# Patient Record
Sex: Female | Born: 1940
Health system: Southern US, Community
[De-identification: ages and names within clinical notes are randomized; demographics above are authoritative.]

## PROBLEM LIST (undated history)

## (undated) DIAGNOSIS — M47812 Spondylosis without myelopathy or radiculopathy, cervical region: Secondary | ICD-10-CM

## (undated) DIAGNOSIS — I499 Cardiac arrhythmia, unspecified: Secondary | ICD-10-CM

## (undated) DIAGNOSIS — R7989 Other specified abnormal findings of blood chemistry: Secondary | ICD-10-CM

## (undated) DIAGNOSIS — E039 Hypothyroidism, unspecified: Secondary | ICD-10-CM

## (undated) DIAGNOSIS — R110 Nausea: Secondary | ICD-10-CM

## (undated) DIAGNOSIS — Z9289 Personal history of other medical treatment: Secondary | ICD-10-CM

## (undated) DIAGNOSIS — B370 Candidal stomatitis: Secondary | ICD-10-CM

## (undated) DIAGNOSIS — I10 Essential (primary) hypertension: Secondary | ICD-10-CM

## (undated) DIAGNOSIS — D649 Anemia, unspecified: Secondary | ICD-10-CM

## (undated) DIAGNOSIS — J189 Pneumonia, unspecified organism: Secondary | ICD-10-CM

## (undated) DIAGNOSIS — N183 Chronic kidney disease, stage 3 (moderate): Secondary | ICD-10-CM

## (undated) DIAGNOSIS — B3781 Candidal esophagitis: Secondary | ICD-10-CM

## (undated) DIAGNOSIS — I4891 Unspecified atrial fibrillation: Secondary | ICD-10-CM

## (undated) DIAGNOSIS — M1990 Unspecified osteoarthritis, unspecified site: Secondary | ICD-10-CM

## (undated) DIAGNOSIS — Z972 Presence of dental prosthetic device (complete) (partial): Secondary | ICD-10-CM

## (undated) DIAGNOSIS — C8230 Follicular lymphoma grade IIIa, unspecified site: Secondary | ICD-10-CM

## (undated) DIAGNOSIS — R0600 Dyspnea, unspecified: Secondary | ICD-10-CM

## (undated) DIAGNOSIS — R519 Headache, unspecified: Secondary | ICD-10-CM

## (undated) DIAGNOSIS — G5603 Carpal tunnel syndrome, bilateral upper limbs: Secondary | ICD-10-CM

## (undated) HISTORY — PX: COLONOSCOPY: SHX174

## (undated) HISTORY — PX: TUBAL LIGATION: SHX77

## (undated) HISTORY — PX: ABDOMINAL HYSTERECTOMY: SHX81

## (undated) HISTORY — DX: Spondylosis without myelopathy or radiculopathy, cervical region: M47.812

## (undated) HISTORY — DX: Nausea: R11.0

## (undated) HISTORY — PX: DILATION AND CURETTAGE OF UTERUS: SHX78

## (undated) HISTORY — DX: Personal history of other medical treatment: Z92.89

## (undated) HISTORY — DX: Hypothyroidism, unspecified: E03.9

## (undated) HISTORY — PX: OTHER SURGICAL HISTORY: SHX169

## (undated) HISTORY — DX: Follicular lymphoma grade IIIa, unspecified site: C82.30

## (undated) HISTORY — PX: APPENDECTOMY: SHX54

## (undated) HISTORY — PX: BACK SURGERY: SHX140

## (undated) HISTORY — DX: Unspecified osteoarthritis, unspecified site: M19.90

---

## 1998-05-12 ENCOUNTER — Other Ambulatory Visit: Admission: RE | Admit: 1998-05-12 | Discharge: 1998-05-12 | Payer: Self-pay | Admitting: *Deleted

## 1999-05-21 ENCOUNTER — Other Ambulatory Visit: Admission: RE | Admit: 1999-05-21 | Discharge: 1999-05-21 | Payer: Self-pay | Admitting: *Deleted

## 2000-06-09 ENCOUNTER — Other Ambulatory Visit: Admission: RE | Admit: 2000-06-09 | Discharge: 2000-06-09 | Payer: Self-pay | Admitting: *Deleted

## 2001-06-13 ENCOUNTER — Other Ambulatory Visit: Admission: RE | Admit: 2001-06-13 | Discharge: 2001-06-13 | Payer: Self-pay | Admitting: *Deleted

## 2002-06-07 ENCOUNTER — Other Ambulatory Visit: Admission: RE | Admit: 2002-06-07 | Discharge: 2002-06-07 | Payer: Self-pay | Admitting: *Deleted

## 2004-03-30 ENCOUNTER — Encounter: Admission: RE | Admit: 2004-03-30 | Discharge: 2004-03-30 | Payer: Self-pay | Admitting: Neurology

## 2004-04-07 ENCOUNTER — Encounter: Admission: RE | Admit: 2004-04-07 | Discharge: 2004-04-07 | Payer: Self-pay | Admitting: Neurology

## 2004-04-15 ENCOUNTER — Encounter: Admission: RE | Admit: 2004-04-15 | Discharge: 2004-04-15 | Payer: Self-pay | Admitting: Neurology

## 2007-12-12 ENCOUNTER — Encounter: Admission: RE | Admit: 2007-12-12 | Discharge: 2007-12-12 | Payer: Self-pay | Admitting: Chiropractic Medicine

## 2009-07-30 ENCOUNTER — Encounter: Payer: Self-pay | Admitting: Cardiovascular Disease

## 2009-08-21 ENCOUNTER — Encounter: Payer: Self-pay | Admitting: Cardiology

## 2009-08-21 ENCOUNTER — Emergency Department (HOSPITAL_COMMUNITY): Admission: EM | Admit: 2009-08-21 | Discharge: 2009-08-21 | Payer: Self-pay | Admitting: Emergency Medicine

## 2009-09-09 ENCOUNTER — Ambulatory Visit: Payer: Self-pay | Admitting: Cardiology

## 2009-09-09 DIAGNOSIS — E039 Hypothyroidism, unspecified: Secondary | ICD-10-CM | POA: Insufficient documentation

## 2009-09-09 DIAGNOSIS — R072 Precordial pain: Secondary | ICD-10-CM | POA: Insufficient documentation

## 2009-09-15 ENCOUNTER — Telehealth (INDEPENDENT_AMBULATORY_CARE_PROVIDER_SITE_OTHER): Payer: Self-pay | Admitting: *Deleted

## 2009-09-16 ENCOUNTER — Ambulatory Visit: Payer: Self-pay | Admitting: Cardiovascular Disease

## 2009-09-16 ENCOUNTER — Ambulatory Visit: Payer: Self-pay

## 2009-09-16 ENCOUNTER — Encounter (HOSPITAL_COMMUNITY): Admission: RE | Admit: 2009-09-16 | Discharge: 2009-10-15 | Payer: Self-pay | Admitting: Cardiology

## 2009-09-19 ENCOUNTER — Telehealth: Payer: Self-pay | Admitting: Cardiology

## 2009-10-18 HISTORY — PX: TOTAL KNEE ARTHROPLASTY: SHX125

## 2009-12-16 ENCOUNTER — Telehealth (INDEPENDENT_AMBULATORY_CARE_PROVIDER_SITE_OTHER): Payer: Self-pay | Admitting: *Deleted

## 2009-12-22 ENCOUNTER — Inpatient Hospital Stay (HOSPITAL_COMMUNITY): Admission: RE | Admit: 2009-12-22 | Discharge: 2009-12-24 | Payer: Self-pay | Admitting: Orthopedic Surgery

## 2010-11-19 NOTE — Progress Notes (Signed)
  Faxed all Cardiac over to [facility] w/ [facility] to fax [phone] [facility]  December 16, 2009 1:32 PM

## 2011-01-11 LAB — CBC
HCT: 30.5 % — ABNORMAL LOW (ref 36.0–46.0)
HCT: 32.9 % — ABNORMAL LOW (ref 36.0–46.0)
HCT: 45.8 % (ref 36.0–46.0)
Hemoglobin: 10.2 g/dL — ABNORMAL LOW (ref 12.0–15.0)
Hemoglobin: 10.9 g/dL — ABNORMAL LOW (ref 12.0–15.0)
Hemoglobin: 14.8 g/dL (ref 12.0–15.0)
MCHC: 32.3 g/dL (ref 30.0–36.0)
MCHC: 33 g/dL (ref 30.0–36.0)
MCHC: 33.5 g/dL (ref 30.0–36.0)
MCV: 92.6 fL (ref 78.0–100.0)
MCV: 94.4 fL (ref 78.0–100.0)
MCV: 94.5 fL (ref 78.0–100.0)
Platelets: 182 10*3/uL (ref 150–400)
Platelets: 189 10*3/uL (ref 150–400)
Platelets: 254 10*3/uL (ref 150–400)
RBC: 3.23 MIL/uL — ABNORMAL LOW (ref 3.87–5.11)
RBC: 3.48 MIL/uL — ABNORMAL LOW (ref 3.87–5.11)
RBC: 4.95 MIL/uL (ref 3.87–5.11)
RDW: 12.8 % (ref 11.5–15.5)
RDW: 13.2 % (ref 11.5–15.5)
RDW: 13.2 % (ref 11.5–15.5)
WBC: 5 10*3/uL (ref 4.0–10.5)
WBC: 7.9 10*3/uL (ref 4.0–10.5)
WBC: 8.1 10*3/uL (ref 4.0–10.5)

## 2011-01-11 LAB — URINALYSIS, ROUTINE W REFLEX MICROSCOPIC
Bilirubin Urine: NEGATIVE
Glucose, UA: NEGATIVE mg/dL
Hgb urine dipstick: NEGATIVE
Ketones, ur: NEGATIVE mg/dL
Nitrite: NEGATIVE
Protein, ur: NEGATIVE mg/dL
Specific Gravity, Urine: 1.024 (ref 1.005–1.030)
Urobilinogen, UA: 0.2 mg/dL (ref 0.0–1.0)
pH: 5.5 (ref 5.0–8.0)

## 2011-01-11 LAB — PROTIME-INR
INR: 1 (ref 0.00–1.49)
Prothrombin Time: 13.1 seconds (ref 11.6–15.2)

## 2011-01-11 LAB — DIFFERENTIAL
Basophils Absolute: 0 10*3/uL (ref 0.0–0.1)
Basophils Relative: 0 % (ref 0–1)
Eosinophils Absolute: 0.1 10*3/uL (ref 0.0–0.7)
Eosinophils Relative: 1 % (ref 0–5)
Lymphocytes Relative: 17 % (ref 12–46)
Lymphs Abs: 0.8 10*3/uL (ref 0.7–4.0)
Monocytes Absolute: 0.5 10*3/uL (ref 0.1–1.0)
Monocytes Relative: 10 % (ref 3–12)
Neutro Abs: 3.6 10*3/uL (ref 1.7–7.7)
Neutrophils Relative %: 72 % (ref 43–77)

## 2011-01-11 LAB — BASIC METABOLIC PANEL
BUN: 11 mg/dL (ref 6–23)
BUN: 17 mg/dL (ref 6–23)
BUN: 7 mg/dL (ref 6–23)
CO2: 28 mEq/L (ref 19–32)
CO2: 31 mEq/L (ref 19–32)
CO2: 32 mEq/L (ref 19–32)
Calcium: 8.5 mg/dL (ref 8.4–10.5)
Calcium: 8.8 mg/dL (ref 8.4–10.5)
Calcium: 9.4 mg/dL (ref 8.4–10.5)
Chloride: 101 mEq/L (ref 96–112)
Chloride: 103 mEq/L (ref 96–112)
Chloride: 105 mEq/L (ref 96–112)
Creatinine, Ser: 0.78 mg/dL (ref 0.4–1.2)
Creatinine, Ser: 0.79 mg/dL (ref 0.4–1.2)
Creatinine, Ser: 0.87 mg/dL (ref 0.4–1.2)
GFR calc Af Amer: >60 mL/min (ref >60)
GFR calc Af Amer: >60 mL/min (ref >60)
GFR calc Af Amer: >60 mL/min (ref >60)
GFR calc non Af Amer: >60 mL/min (ref >60)
GFR calc non Af Amer: >60 mL/min (ref >60)
GFR calc non Af Amer: >60 mL/min (ref >60)
Glucose, Bld: 125 mg/dL — ABNORMAL HIGH (ref 70–99)
Glucose, Bld: 135 mg/dL — ABNORMAL HIGH (ref 70–99)
Glucose, Bld: 70 mg/dL (ref 70–99)
Potassium: 4.2 mEq/L (ref 3.5–5.1)
Potassium: 4.2 mEq/L (ref 3.5–5.1)
Potassium: 4.3 mEq/L (ref 3.5–5.1)
Sodium: 137 mEq/L (ref 135–145)
Sodium: 138 mEq/L (ref 135–145)
Sodium: 139 mEq/L (ref 135–145)

## 2011-01-11 LAB — APTT: aPTT: 28 seconds (ref 24–37)

## 2011-01-11 LAB — ABO/RH: ABO/RH(D): O POS

## 2011-01-11 LAB — TYPE AND SCREEN
ABO/RH(D): O POS
Antibody Screen: NEGATIVE

## 2011-01-20 LAB — DIFFERENTIAL
Basophils Absolute: 0 10*3/uL (ref 0.0–0.1)
Basophils Relative: 0 % (ref 0–1)
Eosinophils Absolute: 0.1 10*3/uL (ref 0.0–0.7)
Eosinophils Relative: 1 % (ref 0–5)
Lymphocytes Relative: 13 % (ref 12–46)
Lymphs Abs: 1 10*3/uL (ref 0.7–4.0)
Monocytes Absolute: 0.5 10*3/uL (ref 0.1–1.0)
Monocytes Relative: 6 % (ref 3–12)
Neutro Abs: 6.3 10*3/uL (ref 1.7–7.7)
Neutrophils Relative %: 79 % — ABNORMAL HIGH (ref 43–77)

## 2011-01-20 LAB — POCT CARDIAC MARKERS
CKMB, poc: 2.3 ng/mL (ref 1.0–8.0)
CKMB, poc: 2.9 ng/mL (ref 1.0–8.0)
Myoglobin, poc: 79.5 ng/mL (ref 12–200)
Myoglobin, poc: 93.1 ng/mL (ref 12–200)
Troponin i, poc: <0.05 ng/mL (ref 0.00–0.09)
Troponin i, poc: <0.05 ng/mL (ref 0.00–0.09)

## 2011-01-20 LAB — URINE CULTURE: Colony Count: 6000

## 2011-01-20 LAB — COMPREHENSIVE METABOLIC PANEL
ALT: 19 U/L (ref 0–35)
AST: 36 U/L (ref 0–37)
Albumin: 4.2 g/dL (ref 3.5–5.2)
Alkaline Phosphatase: 62 U/L (ref 39–117)
BUN: 21 mg/dL (ref 6–23)
CO2: 27 mEq/L (ref 19–32)
Calcium: 9.7 mg/dL (ref 8.4–10.5)
Chloride: 104 mEq/L (ref 96–112)
Creatinine, Ser: 0.93 mg/dL (ref 0.4–1.2)
GFR calc Af Amer: >60 mL/min (ref >60)
GFR calc non Af Amer: >60 mL/min (ref >60)
Glucose, Bld: 89 mg/dL (ref 70–99)
Potassium: 4.8 mEq/L (ref 3.5–5.1)
Sodium: 139 mEq/L (ref 135–145)
Total Bilirubin: 1 mg/dL (ref 0.3–1.2)
Total Protein: 7 g/dL (ref 6.0–8.3)

## 2011-01-20 LAB — CBC
HCT: 44.1 % (ref 36.0–46.0)
Hemoglobin: 15 g/dL (ref 12.0–15.0)
MCHC: 34 g/dL (ref 30.0–36.0)
MCV: 93.5 fL (ref 78.0–100.0)
Platelets: 232 10*3/uL (ref 150–400)
RBC: 4.71 MIL/uL (ref 3.87–5.11)
RDW: 13.4 % (ref 11.5–15.5)
WBC: 8 10*3/uL (ref 4.0–10.5)

## 2011-01-20 LAB — URINALYSIS, ROUTINE W REFLEX MICROSCOPIC
Bilirubin Urine: NEGATIVE
Glucose, UA: NEGATIVE mg/dL
Hgb urine dipstick: NEGATIVE
Ketones, ur: 15 mg/dL — AB
Nitrite: NEGATIVE
Protein, ur: NEGATIVE mg/dL
Specific Gravity, Urine: 1.022 (ref 1.005–1.030)
Urobilinogen, UA: 0.2 mg/dL (ref 0.0–1.0)
pH: 5.5 (ref 5.0–8.0)

## 2011-01-20 LAB — PROTIME-INR
INR: 1 (ref 0.00–1.49)
Prothrombin Time: 13.1 seconds (ref 11.6–15.2)

## 2011-01-20 LAB — D-DIMER, QUANTITATIVE (NOT AT ARMC): D-Dimer, Quant: 1.31 ug/mL-FEU — ABNORMAL HIGH (ref 0.00–0.48)

## 2011-01-20 LAB — APTT: aPTT: 22 seconds — ABNORMAL LOW (ref 24–37)

## 2012-06-21 ENCOUNTER — Encounter: Payer: Self-pay | Admitting: *Deleted

## 2012-07-10 ENCOUNTER — Encounter: Payer: Self-pay | Admitting: Cardiovascular Disease

## 2012-07-10 ENCOUNTER — Ambulatory Visit (INDEPENDENT_AMBULATORY_CARE_PROVIDER_SITE_OTHER): Payer: Medicare HMO | Admitting: Cardiovascular Disease

## 2012-07-10 VITALS — BP 158/99 | HR 69 | Ht 61.5 in | Wt 126.0 lb

## 2012-07-10 DIAGNOSIS — IMO0001 Reserved for inherently not codable concepts without codable children: Secondary | ICD-10-CM

## 2012-07-10 DIAGNOSIS — I10 Essential (primary) hypertension: Secondary | ICD-10-CM

## 2012-07-10 NOTE — Progress Notes (Signed)
HPI:  71 year old woman presenting for evaluation of hypertension. She was seen in our office in 2010 by Dr. Antoine Poche for evaluation of chest pain. At that time she underwent a stress Myoview evaluation and demonstrated normal myocardial perfusion and normal left ventricular ejection fraction.  She was seen at the [facility] for an outpatient visit recently and was noted to have hypertension. She was advised to seek followup. The patient records home blood pressure periodically and notes readings in the range of 110-120/60-70. She has no cardiopulmonary symptoms. She specifically denies chest pain, chest pressure, dyspnea, palpitations, lightheadedness, orthopnea, PND, or syncope. She notes mild leg edema when she travels.  The patient's mother had hypertension. The patient has no personal history of hypertension and has not required antihypertensive medicine in the past.  Outpatient Encounter Prescriptions as of 07/10/2012  Medication Sig Dispense Refill  . alendronate (FOSAMAX) 70 MG tablet Take 70 mg by mouth every 7 (seven) days. Take with a full glass of water on an empty stomach.      Marland Kitchen aspirin 81 MG tablet Take 81 mg by mouth daily.      . calcium carbonate (OS-CAL) 600 MG TABS Take 600 mg by mouth 2 (two) times daily with a meal.      . estrogens, conjugated, (PREMARIN) 0.3 MG tablet Take 0.3 mg by mouth daily. Take daily for 21 days then do not take for 7 days.      Marland Kitchen levothyroxine (SYNTHROID, LEVOTHROID) 75 MCG tablet Take 75 mcg by mouth daily.      . Multiple Vitamin (MULTIVITAMIN) capsule Take 1 capsule by mouth daily.        Review of patient's allergies indicates no known allergies.  Past Medical History  Diagnosis Date  . DJD (degenerative joint disease)   . Hypothyroid     Past Surgical History  Procedure Date  . Patelar tendon transplants   . Back surgery     X5  . Abdominal hysterectomy   . Dilation and curettage of uterus     History   Social History    . Marital Status: Married    Spouse Name: N/A    Number of Children: 2  . Years of Education: N/A   Occupational History  . retired    Social History Main Topics  . Smoking status: Never Smoker   . Smokeless tobacco: Never Used  . Alcohol Use: Not on file  . Drug Use: Not on file  . Sexually Active: Not on file   Other Topics Concern  . Not on file   Social History Narrative  . No narrative on file   Family history: Negative for coronary artery disease. Her mother died at age 42 of non-Hodgkin's lymphoma. Her father died at age 67 of a drowning accident.  ROS: General: no fevers/chills/night sweats Eyes: no blurry vision, diplopia, or amaurosis ENT: no sore throat or hearing loss Resp: no cough, wheezing, or hemoptysis CV: no edema or palpitations GI: no abdominal pain, nausea, vomiting, diarrhea, or constipation GU: no dysuria, frequency, or hematuria Skin: no rash Neuro: no headache, numbness, tingling, or weakness of extremities Musculoskeletal: no joint pain or swelling Heme: no bleeding, DVT, or easy bruising Endo: no polydipsia or polyuria  BP 158/99  Pulse 69  Ht 5' 1.5" (1.562 m)  Wt 57.153 kg (126 lb)  BMI 23.42 kg/m2 Blood pressure on my recheck was 160/90  PHYSICAL EXAM: Pt is alert and oriented, WD, WN, in no distress. HEENT:  normal Neck: JVP normal. Carotid upstrokes normal without bruits. No thyromegaly. Lungs: equal expansion, clear bilaterally CV: Apex is discrete and nondisplaced, RRR without murmur or gallop Abd: soft, NT, +BS, no bruit, no hepatosplenomegaly Back: no CVA tenderness Ext: no C/C/E        Femoral pulses 2+= without bruits        DP/PT pulses intact and = Skin: warm and dry without rash Neuro: CNII-XII intact             Strength intact = bilaterally  EKG:  Normal sinus rhythm 69 beats per minute, rare PAC, T-wave abnormality consider lateral ischemia. No significant change from previous tracing dated  08/21/2009.  ASSESSMENT AND PLAN: Hypertension, apparent whitecoat hypertension. Based on her home blood pressure readings, she appears to have isolated white-coat HTN. She has not been checking her blood pressure regularly and I asked her start checking her blood pressure 3 times per week for the next 3 weeks. She will call in with the readings especially if they're greater than 140/90. If she ultimately required antihypertensive treatment, she would be a good candidate for diuretic. We'll request her blood work from Dr. Carolee Rota office to make sure that her renal function is normal. At this point I do not think she requires treatment and we will make this decision based on her home readings.  Tonny Bollman 07/10/2012 9:49 AM

## 2012-07-10 NOTE — Patient Instructions (Signed)
Your physician has requested that you regularly monitor and record your blood pressure readings at home. Please use the same machine at the same time of day to check your readings and record them to bring to your follow-up visit. Please check your BP two times per week for the next 3 weeks.  Please contact our office if your BP is running above 140/90.  Your physician recommends that you schedule a follow-up appointment as needed.   Your physician recommends that you continue on your current medications as directed. Please refer to the Current Medication list given to you today.

## 2012-07-26 ENCOUNTER — Telehealth: Payer: Self-pay | Admitting: Cardiovascular Disease

## 2012-07-26 NOTE — Telephone Encounter (Signed)
New Problem:    Patient called in to report that her BP is ok.  Please call back if you have any questions.

## 2012-07-26 NOTE — Telephone Encounter (Signed)
I spoke with the pt and her BP has been normal at home.  The pt's BP has been running around 120/80 on average (124/80, 126/84). The pt will continue current regimen.

## 2013-03-21 DIAGNOSIS — H3560 Retinal hemorrhage, unspecified eye: Secondary | ICD-10-CM | POA: Diagnosis not present

## 2013-03-21 DIAGNOSIS — H25099 Other age-related incipient cataract, unspecified eye: Secondary | ICD-10-CM | POA: Diagnosis not present

## 2013-03-26 DIAGNOSIS — H348392 Tributary (branch) retinal vein occlusion, unspecified eye, stable: Secondary | ICD-10-CM | POA: Diagnosis not present

## 2013-10-18 ENCOUNTER — Emergency Department (HOSPITAL_COMMUNITY)
Admission: EM | Admit: 2013-10-18 | Discharge: 2013-10-18 | Disposition: A | Discharge status: Home / Self Care | Payer: Medicare HMO | Attending: Emergency Medicine | Admitting: Emergency Medicine

## 2013-10-18 ENCOUNTER — Emergency Department (HOSPITAL_COMMUNITY): Payer: Medicare HMO

## 2013-10-18 ENCOUNTER — Encounter (HOSPITAL_COMMUNITY): Payer: Self-pay | Admitting: Emergency Medicine

## 2013-10-18 DIAGNOSIS — R5381 Other malaise: Secondary | ICD-10-CM | POA: Diagnosis not present

## 2013-10-18 DIAGNOSIS — I959 Hypotension, unspecified: Secondary | ICD-10-CM | POA: Diagnosis not present

## 2013-10-18 DIAGNOSIS — Z9889 Other specified postprocedural states: Secondary | ICD-10-CM | POA: Insufficient documentation

## 2013-10-18 DIAGNOSIS — R404 Transient alteration of awareness: Secondary | ICD-10-CM | POA: Diagnosis not present

## 2013-10-18 DIAGNOSIS — R29898 Other symptoms and signs involving the musculoskeletal system: Secondary | ICD-10-CM

## 2013-10-18 DIAGNOSIS — Z7982 Long term (current) use of aspirin: Secondary | ICD-10-CM | POA: Insufficient documentation

## 2013-10-18 DIAGNOSIS — Z79899 Other long term (current) drug therapy: Secondary | ICD-10-CM | POA: Insufficient documentation

## 2013-10-18 DIAGNOSIS — R51 Headache: Secondary | ICD-10-CM | POA: Insufficient documentation

## 2013-10-18 DIAGNOSIS — E039 Hypothyroidism, unspecified: Secondary | ICD-10-CM | POA: Insufficient documentation

## 2013-10-18 DIAGNOSIS — M5412 Radiculopathy, cervical region: Secondary | ICD-10-CM

## 2013-10-18 DIAGNOSIS — R5383 Other fatigue: Principal | ICD-10-CM

## 2013-10-18 LAB — POCT I-STAT, CHEM 8
BUN: 21 mg/dL (ref 6–23)
Calcium, Ion: 1.14 mmol/L (ref 1.13–1.30)
Chloride: 104 mEq/L (ref 96–112)
Creatinine, Ser: 1 mg/dL (ref 0.50–1.10)
Glucose, Bld: 125 mg/dL — ABNORMAL HIGH (ref 70–99)
HCT: 40 % (ref 36.0–46.0)
Hemoglobin: 13.6 g/dL (ref 12.0–15.0)
Potassium: 3.7 mEq/L (ref 3.7–5.3)
Sodium: 143 mEq/L (ref 137–147)
TCO2: 24 mmol/L (ref 0–100)

## 2013-10-18 LAB — CBC WITH DIFFERENTIAL/PLATELET
Basophils Absolute: 0 10*3/uL (ref 0.0–0.1)
Basophils Relative: 0 % (ref 0–1)
Eosinophils Absolute: 0.2 10*3/uL (ref 0.0–0.7)
Eosinophils Relative: 5 % (ref 0–5)
HCT: 38.8 % (ref 36.0–46.0)
Hemoglobin: 13 g/dL (ref 12.0–15.0)
Lymphocytes Relative: 25 % (ref 12–46)
Lymphs Abs: 1.2 10*3/uL (ref 0.7–4.0)
MCH: 30.8 pg (ref 26.0–34.0)
MCHC: 33.5 g/dL (ref 30.0–36.0)
MCV: 91.9 fL (ref 78.0–100.0)
Monocytes Absolute: 0.6 10*3/uL (ref 0.1–1.0)
Monocytes Relative: 12 % (ref 3–12)
Neutro Abs: 2.9 10*3/uL (ref 1.7–7.7)
Neutrophils Relative %: 58 % (ref 43–77)
Platelets: 176 10*3/uL (ref 150–400)
RBC: 4.22 MIL/uL (ref 3.87–5.11)
RDW: 13.2 % (ref 11.5–15.5)
WBC: 4.9 10*3/uL (ref 4.0–10.5)

## 2013-10-18 MED ORDER — NAPROXEN 500 MG PO TABS: 500.0000 mg | ORAL_TABLET | Freq: Two times a day (BID) | ORAL | Status: DC

## 2013-10-18 MED ORDER — PREDNISONE 20 MG PO TABS: 60.0000 mg | ORAL_TABLET | Freq: Every day | ORAL | Status: DC

## 2013-10-18 NOTE — ED Notes (Signed)
EMS-pt reports 2 day history right hand weakness and feeling like she is off balanced, both symptoms are new for the patient. On EMS exam right arm drift noted with no other neurological symptoms. Vitals WNL. Patient got pale and diaphoretic en route when she stated that she felt like she was having a hot flash. 20(L)wrist. CBG 140. 130/80

## 2013-10-18 NOTE — ED Notes (Signed)
Pt reports discomfort to left occipital region radiating down into neck. Denies neck stiffness. States it doesn't feel like a headache.

## 2013-10-18 NOTE — ED Notes (Signed)
Weakness noted to R hand and arm. Pt reports 7/10 headache at the time. Vital signs stable. Pt updated on plan of care for MRI. Pt is alert and oriented x4.

## 2013-10-18 NOTE — ED Notes (Signed)
Pt discharged home. Encouraged to follow up with specialist. Friend at bedside to transport home. Vital signs stable.

## 2013-10-18 NOTE — Discharge Instructions (Signed)
Cervical Radiculopathy  Cervical radiculopathy happens when a nerve in the neck is pinched or bruised by a slipped (herniated) disk or by arthritic changes in the bones of the cervical spine. This can occur due to an injury or as part of the normal aging process. Pressure on the cervical nerves can cause pain or numbness that runs from your neck all the way down into your arm and fingers.  CAUSES   There are many possible causes, including:  · Injury.  · Muscle tightness in the neck from overuse.  · Swollen, painful joints (arthritis).  · Breakdown or degeneration in the bones and joints of the spine (spondylosis) due to aging.  · Bone spurs that may develop near the cervical nerves.  SYMPTOMS   Symptoms include pain, weakness, or numbness in the affected arm and hand. Pain can be severe or irritating. Symptoms may be worse when extending or turning the neck.  DIAGNOSIS   Your caregiver will ask about your symptoms and do a physical exam. He or she may test your strength and reflexes. X-rays, CT scans, and MRI scans may be needed in cases of injury or if the symptoms do not go away after a period of time. Electromyography (EMG) or nerve conduction testing may be done to study how your nerves and muscles are working.  TREATMENT   Your caregiver may recommend certain exercises to help relieve your symptoms. Cervical radiculopathy can, and often does, get better with time and treatment. If your problems continue, treatment options may include:  · Wearing a soft collar for short periods of time.  · Physical therapy to strengthen the neck muscles.  · Medicines, such as nonsteroidal anti-inflammatory drugs (NSAIDs), oral corticosteroids, or spinal injections.  · Surgery. Different types of surgery may be done depending on the cause of your problems.  HOME CARE INSTRUCTIONS   · Put ice on the affected area.  · Put ice in a plastic bag.  · Place a towel between your skin and the bag.  · Leave the ice on for 15-20 minutes,  03-04 times a day or as directed by your caregiver.  · If ice does not help, you can try using heat. Take a warm shower or bath, or use a hot water bottle as directed by your caregiver.  · You may try a gentle neck and shoulder massage.  · Use a flat pillow when you sleep.  · Only take over-the-counter or prescription medicines for pain, discomfort, or fever as directed by your caregiver.  · If physical therapy was prescribed, follow your caregiver's directions.  · If a soft collar was prescribed, use it as directed.  SEEK IMMEDIATE MEDICAL CARE IF:   · Your pain gets much worse and cannot be controlled with medicines.  · You have weakness or numbness in your hand, arm, face, or leg.  · You have a high fever or a stiff, rigid neck.  · You lose bowel or bladder control (incontinence).  · You have trouble with walking, balance, or speaking.  MAKE SURE YOU:   · Understand these instructions.  · Will watch your condition.  · Will get help right away if you are not doing well or get worse.  Document Released: 06/29/2001 Document Revised: 12/27/2011 Document Reviewed: 05/18/2011  [employer]® Patient Information ©2014 [employer].

## 2013-10-18 NOTE — ED Provider Notes (Signed)
CSN: [id number]     Arrival date & time 10/18/13  0415 History   First MD Initiated Contact with Patient 10/18/13 0430     Chief Complaint  Patient presents with  . Extremity Weakness   (Consider location/radiation/quality/duration/timing/severity/associated sxs/prior Treatment) HPI Comments: The patient is a 73 year old female with a history of hypothyroidism and known bone spurs in her cervical spine that have caused a prior mild radiculopathy involving her right great thumb. She has had bilateral back pain in the past and did have some neck pain this evening. She reports that her chief complaint this evening is difficulty using her right hand. Specifically she noted that yesterday morning she awoke with numbness in her right hand over the fourth and fifth digits. This has been persistent over the last 24 hours, this evening when she awoke she noted that she had neck pain in the right and left side of the neck and had difficulty using her hand and her arm. She states that she is unable to straighten or flex her fourth and fifth digits of the right hand. She has had no trouble with her vision, speech or her gait with no leg complaints. There is a mild headache in the posterior scalp area and she denies history of similar symptoms. There has been no fevers chills nausea or vomiting.  Patient is a 73 y.o. female presenting with extremity weakness. The history is provided by the patient.  Extremity Weakness    Past Medical History  Diagnosis Date  . DJD (degenerative joint disease)   . Hypothyroid    Past Surgical History  Procedure Laterality Date  . Patelar tendon transplants    . Back surgery      X5  . Abdominal hysterectomy    . Dilation and curettage of uterus     No family history on file. History  Substance Use Topics  . Smoking status: Never Smoker   . Smokeless tobacco: Never Used  . Alcohol Use: Not on file   OB History   Grav Para Term Preterm Abortions TAB SAB Ect Mult  Living                 Review of Systems  Musculoskeletal: Positive for extremity weakness.  All other systems reviewed and are negative.    Allergies  Review of patient's allergies indicates no known allergies.  Home Medications   Current Outpatient Rx  Name  Route  Sig  Dispense  Refill  . alendronate (FOSAMAX) 70 MG tablet   Oral   Take 70 mg by mouth every 7 (seven) days. Take with a full glass of water on an empty stomach.         Marland Kitchen aspirin 81 MG tablet   Oral   Take 81 mg by mouth daily.         . calcium carbonate (OS-CAL) 600 MG TABS   Oral   Take 600 mg by mouth 2 (two) times daily with a meal.         . estrogens, conjugated, (PREMARIN) 0.3 MG tablet   Oral   Take 0.3 mg by mouth daily. Take daily for 21 days then do not take for 7 days.         Marland Kitchen levothyroxine (SYNTHROID, LEVOTHROID) 75 MCG tablet   Oral   Take 75 mcg by mouth daily.         . Multiple Vitamin (MULTIVITAMIN) capsule   Oral   Take 1 capsule by mouth daily.  BP 167/109  Pulse 67  Temp(Src) 98 F (36.7 C) (Oral)  Resp 19  SpO2 97% Physical Exam  Nursing note and vitals reviewed. Constitutional: She appears well-developed and well-nourished. No distress.  HENT:  Head: Normocephalic and atraumatic.  Mouth/Throat: Oropharynx is clear and moist. No oropharyngeal exudate.  Eyes: Conjunctivae and EOM are normal. Pupils are equal, round, and reactive to light. Right eye exhibits no discharge. Left eye exhibits no discharge. No scleral icterus.  Neck: Normal range of motion. Neck supple. No JVD present. No thyromegaly present.  Cardiovascular: Normal rate, regular rhythm, normal heart sounds and intact distal pulses.  Exam reveals no gallop and no friction rub.   No murmur heard. No carotid bruits auscultated  Pulmonary/Chest: Effort normal and breath sounds normal. No respiratory distress. She has no wheezes. She has no rales.  Abdominal: Soft. Bowel sounds are normal.  She exhibits no distension and no mass. There is no tenderness.  Musculoskeletal: Normal range of motion. She exhibits no edema and no tenderness.  No reproducible tenderness with range of motion of the neck or palpation of the neck  Lymphadenopathy:    She has no cervical adenopathy.  Neurological: She is alert. Coordination normal.  Cranial nerves III through XII are intact, bilateral lower extremities with normal strength and sensation at the major muscle groups. Left upper extremity with a normal exam including strength and sensation in all major muscle groups, right upper extremity with weakness at the triceps, weakness at the shoulder, slight weakness of the grip, inability to extend or flex at the fourth and fifth digits, decreased sensation to the small finger.  Skin: Skin is warm and dry. No rash noted. No erythema.  Psychiatric: She has a normal mood and affect. Her behavior is normal.    ED Course  Procedures (including critical care time) Labs Review Labs Reviewed - No data to display Imaging Review No results found.  EKG Interpretation   None       MDM  No diagnosis found. The patient has focal neurologic abnormalities that could be consistent with a cervical nerve 8 radiculopathy. Because of her mild headache and neck pain we'll also obtain a CT scan of the head to rule out acute stroke though there are no other acute findings that would suggest that this is a stroke. She has no drift on my exam, normal coordination and speech.  Discussed care with the neurosurgeon as well as the neurologist Dr. Doy Mince, agreement and doing an MRI of the cervical spine to look for radiculopathy, compression of cervical nerve.  Change of shift - care signed out to Dr. Karle Starch pending MRI  Johnna Acosta, MD 10/18/13 [phone]

## 2013-10-18 NOTE — ED Provider Notes (Signed)
Care assumed at the change of shift pending MRI of C-spine. Images reviewed, report as below. Disucssed these findings with the patient. Recommend close Neurology follow-up for further management.   Mr Cervical Spine Wo Contrast  10/18/2013   CLINICAL DATA:  Neck pain.  Right arm pain.  EXAM: MRI CERVICAL SPINE WITHOUT CONTRAST  TECHNIQUE: Multiplanar, multisequence MR imaging was performed. No intravenous contrast was administered.  COMPARISON:  None.  FINDINGS: The foramen magnum is widely patent.  C1-2 is normal.  C2-3:  Mild facet degeneration on the left.  No stenosis.  C 3 through C7 shows kyphotic curvature.  C3-4: Facet arthropathy left worse than right. Endplate osteophytes and bulging disk material more marked towards the left. Narrowing of the subarachnoid space but no cord compression. Mild foraminal narrowing bilaterally.  C4-5 spondylosis with endplate osteophytes and bulging of the disc. Effacement of the ventral subarachnoid space but no compression of the cord. Foraminal stenosis bilaterally that could cause neural compression on either or both sides.  C5-6: Spondylosis with endplate osteophytes and bulging of the disc. Narrowing of the ventral subarachnoid space but no compression of the cord. Foraminal stenosis bilaterally that could cause neural compression on either or both sides.  C6-7: Spondylosis with endplate osteophytes and protruding disk material slightly more prominent towards the right. No central canal stenosis. Foraminal narrowing right worse than left. Either C7 nerve root could be affected, more likely the right.  C7-T1:  Mild uncovertebral prominence.  No stenosis.  No abnormal cord signal.  IMPRESSION: Kyphotic curvature in the cervical region.  Degenerative spondylosis from C3-4 thru C6-7. Endplate osteophytes and bulging disc material narrow of the spinal canal, efface the ventral subarachnoid space. No actual compression of the cord. There is foraminal stenosis throughout the  region that could cause neural compression on either or both sides. This is most marked at C4-5 bilaterally, C5-6 bilaterally and on the right at C6-.   Electronically Signed   By: Nelson Chimes M.D.   On: 10/18/2013 10:11    Charles B. Karle Starch, MD 10/18/13 1029

## 2013-10-18 NOTE — ED Notes (Signed)
Pt transported to MRI 

## 2013-10-25 ENCOUNTER — Ambulatory Visit (INDEPENDENT_AMBULATORY_CARE_PROVIDER_SITE_OTHER): Payer: Managed Care, Other (non HMO) | Admitting: Neurology

## 2013-10-25 ENCOUNTER — Encounter: Payer: Self-pay | Admitting: Neurology

## 2013-10-25 VITALS — BP 150/85 | HR 105 | Ht 60.5 in | Wt 141.0 lb

## 2013-10-25 DIAGNOSIS — M47812 Spondylosis without myelopathy or radiculopathy, cervical region: Secondary | ICD-10-CM

## 2013-10-25 DIAGNOSIS — M4712 Other spondylosis with myelopathy, cervical region: Secondary | ICD-10-CM | POA: Insufficient documentation

## 2013-10-25 DIAGNOSIS — R29898 Other symptoms and signs involving the musculoskeletal system: Secondary | ICD-10-CM

## 2013-10-25 HISTORY — DX: Spondylosis without myelopathy or radiculopathy, cervical region: M47.812
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
## 2013-11-05 ENCOUNTER — Ambulatory Visit (INDEPENDENT_AMBULATORY_CARE_PROVIDER_SITE_OTHER): Payer: Managed Care, Other (non HMO) | Admitting: Neurology

## 2013-11-05 ENCOUNTER — Encounter (INDEPENDENT_AMBULATORY_CARE_PROVIDER_SITE_OTHER): Payer: Managed Care, Other (non HMO) | Admitting: Radiology

## 2013-11-05 DIAGNOSIS — M4712 Other spondylosis with myelopathy, cervical region: Secondary | ICD-10-CM

## 2013-11-05 DIAGNOSIS — R29898 Other symptoms and signs involving the musculoskeletal system: Secondary | ICD-10-CM

## 2013-11-05 MED ORDER — GABAPENTIN 100 MG PO CAPS: 100.0000 mg | ORAL_CAPSULE | Freq: Three times a day (TID) | ORAL | Status: DC
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
## 2013-11-13 ENCOUNTER — Other Ambulatory Visit (HOSPITAL_COMMUNITY): Payer: Self-pay | Admitting: Obstetrics and Gynecology

## 2013-11-13 DIAGNOSIS — R011 Cardiac murmur, unspecified: Secondary | ICD-10-CM

## 2013-11-20 ENCOUNTER — Ambulatory Visit (HOSPITAL_COMMUNITY)
Admission: RE | Admit: 2013-11-20 | Discharge: 2013-11-20 | Disposition: A | Discharge status: Home / Self Care | Payer: Medicare HMO | Source: Ambulatory Visit | Attending: Obstetrics and Gynecology | Admitting: Obstetrics and Gynecology

## 2013-11-20 DIAGNOSIS — R011 Cardiac murmur, unspecified: Secondary | ICD-10-CM | POA: Insufficient documentation

## 2013-11-20 DIAGNOSIS — I517 Cardiomegaly: Secondary | ICD-10-CM

## 2013-11-20 DIAGNOSIS — I319 Disease of pericardium, unspecified: Secondary | ICD-10-CM | POA: Insufficient documentation

## 2013-11-20 DIAGNOSIS — I079 Rheumatic tricuspid valve disease, unspecified: Secondary | ICD-10-CM | POA: Insufficient documentation

## 2013-11-20 DIAGNOSIS — I080 Rheumatic disorders of both mitral and aortic valves: Secondary | ICD-10-CM | POA: Insufficient documentation

## 2013-11-20 NOTE — Progress Notes (Signed)
  Echocardiogram 2D Echocardiogram has been performed.  Carrie [name] 11/20/2013, 12:05 PM

## 2014-01-08 ENCOUNTER — Encounter: Payer: Self-pay | Admitting: Cardiovascular Disease

## 2014-01-08 ENCOUNTER — Ambulatory Visit (INDEPENDENT_AMBULATORY_CARE_PROVIDER_SITE_OTHER): Payer: Medicare HMO | Admitting: Cardiovascular Disease

## 2014-01-08 VITALS — BP 126/64 | HR 68 | Ht 60.5 in | Wt 131.0 lb

## 2014-01-08 DIAGNOSIS — IMO0001 Reserved for inherently not codable concepts without codable children: Secondary | ICD-10-CM

## 2014-01-08 DIAGNOSIS — I10 Essential (primary) hypertension: Secondary | ICD-10-CM

## 2014-01-08 NOTE — Patient Instructions (Signed)
Your physician recommends that you schedule a follow-up appointment in: AS NEEDED with Dr. Burt Knack

## 2014-01-10 ENCOUNTER — Encounter: Payer: Self-pay | Admitting: Cardiovascular Disease

## 2014-01-10 NOTE — Progress Notes (Signed)
HPI:  73 year-old woman presenting for follow-up evaluation. She was last seen in 2013 for HTN. Has hx of Myoview scan in 2010 which was normal. Long hx of white-coat HTN noted, but home readings have been good. Recently noted to have a heart murmur and an echocardiogram was done with result below.   She denies chest pain, shortness of breath, edema, or palpitations. Has been having longstanding problems with hot flashes. Was previously on HRT.  The patient is widowed. I followed her husband who had atrial fib in the setting of multiple medical problems.   Outpatient Encounter Prescriptions as of 01/08/2014  Medication Sig  . aspirin 81 MG tablet Take 81 mg by mouth daily.  Marland Kitchen levothyroxine (SYNTHROID, LEVOTHROID) 75 MCG tablet Take 75 mcg by mouth daily.  . Multiple Vitamin (MULTIVITAMIN WITH MINERALS) TABS tablet Take 1 tablet by mouth daily.  . Multiple Vitamins-Minerals (AIRBORNE PO) Take 1 tablet by mouth daily.  Marland Kitchen PARoxetine (PAXIL) 10 MG tablet Take 10 mg by mouth daily.  . Vitamin D, Cholecalciferol, 400 UNITS CHEW Chew 400 each by mouth daily.  . [DISCONTINUED] alendronate (FOSAMAX) 70 MG tablet Take 70 mg by mouth every 7 (seven) days. On tuesdays  . [DISCONTINUED] calcium carbonate (OS-CAL) 600 MG TABS Take 600 mg by mouth 2 (two) times daily with a meal.  . [DISCONTINUED] Calcium-Vitamin D-Vitamin K (CALCIUM + D) [phone] MG-UNT-MCG CHEW Chew 500 each by mouth daily.  . [DISCONTINUED] estrogens, conjugated, (PREMARIN) 0.3 MG tablet Take 0.3 mg by mouth daily. Take daily for 21 days then do not take for 7 days.  . [DISCONTINUED] gabapentin (NEURONTIN) 100 MG capsule Take 1 capsule (100 mg total) by mouth 3 (three) times daily.  . [DISCONTINUED] naproxen (NAPROSYN) 500 MG tablet Take 1 tablet (500 mg total) by mouth 2 (two) times daily.  . [DISCONTINUED] predniSONE (DELTASONE) 20 MG tablet Take 3 tablets (60 mg total) by mouth daily.    No Known Allergies  Past Medical  History  Diagnosis Date  . DJD (degenerative joint disease)   . Hypothyroid   . Cervical spondylosis without myelopathy 10/25/2013    ROS: Negative except as per HPI  BP 126/64  Pulse 68  Ht 5' 0.5" (1.537 m)  Wt 59.421 kg (131 lb)  BMI 25.15 kg/m2  PHYSICAL EXAM: Pt is alert and oriented, NAD HEENT: normal Neck: JVP - normal, carotids 2+= without bruits Lungs: CTA bilaterally CV: RRR with grade 2/6 SEM at the left sternal border Abd: soft, NT, Positive BS, no hepatomegaly Ext: no C/C/E, distal pulses intact and equal Skin: warm/dry no rash  EKG:  NSR 68 bpm, nonspecific T wave abnormality (flattening)  Echo:  Study Conclusions  - Left ventricle: The cavity size was normal. Wall thickness was increased in a pattern of mild LVH. Systolic function was normal. The estimated ejection fraction was in the range of 55% to 60%. Wall motion was normal; there were no regional wall motion abnormalities. Doppler parameters are consistent with abnormal left ventricular relaxation (grade 1 diastolic dysfunction). - Aortic valve: There was no stenosis. Trivial regurgitation. - Mitral valve: Trivial regurgitation. - Left atrium: The atrium was moderately dilated. - Right ventricle: The cavity size was normal. Systolic function was normal. - Tricuspid valve: Peak RV-RA gradient: 84mm Hg (S). - Pulmonary arteries: PA peak pressure: 42mm Hg (S). - Inferior vena cava: The vessel was normal in size; the respirophasic diameter changes were in the normal range (= 50%); findings are consistent with  normal central venous pressure. - Pericardium, extracardiac: A trivial pericardial effusion was identified posterior to the heart. Impressions:  - Normal LV size and systolic function with mild LV hypertrophy. EF 55-60%. Normal RV size and systolic function. No significant valvular abnormalities. Moderate LAE. No definite cause for murmur noted.  ASSESSMENT AND PLAN: 1. Heart murmur -  benign. Exam and echo suggest benign flow murmur.  2. White coat HTN - BP in good range today on no Rx.  Will see back as needed. Overall seems to be stable from CV perspective.   Sherren Mocha 01/10/2014 12:36 AM

## 2014-09-04 ENCOUNTER — Encounter: Payer: Self-pay | Admitting: Neurology

## 2014-09-10 ENCOUNTER — Encounter: Payer: Self-pay | Admitting: Neurology

## 2014-12-20 ENCOUNTER — Ambulatory Visit (INDEPENDENT_AMBULATORY_CARE_PROVIDER_SITE_OTHER): Payer: Self-pay | Admitting: Surgery

## 2014-12-20 NOTE — H&P (Signed)
Carrie [name] 12/20/2014 9:56 AM Location: [facility] Patient #: [id number] DOB: Nov 20, 1940 Widowed / Language: [name] / Race: White Female History of Present Illness Marcello Moores A. Linkin Vizzini MD; 12/20/2014 10:19 AM) Patient words: reck  GROIN LYMPHADENOPATHY.  The patient is a 74 year old female who presents with lymphadenopathy. The last clinic visit was 2 month(s) ago. No changes in management were made at the last visit. Symptoms include palpable lump and visible lump, while symptoms do not include fever or malaise. The lymphadenopathy is located in the left cervical region, in the left axillary region, in the left inguinal region, in the right axillary region and in the right inguinal region. The nodes are described as firm. Onset was gradual. There is no known event that preceded symptom onset. The patient describes this as mild and growing larger. Associated symptoms include night sweats. Other Problems Davy Pique Bynum, CMA; 12/20/2014 9:57 AM) Arthritis Back Pain Heart murmur Hemorrhoids Oophorectomy Bilateral. Thyroid Disease Transfusion history  Past Surgical History Marjean Donna, CMA; 12/20/2014 9:57 AM) Appendectomy Foot Surgery Left. Hysterectomy (not due to cancer) - Complete Knee Surgery Bilateral. Nephrectomy Bilateral. Oral Surgery Spinal Surgery Midback  Diagnostic Studies History Marjean Donna, CMA; 12/20/2014 9:57 AM) Colonoscopy 1-5 years ago Mammogram within last year Pap Smear 1-5 years ago  Allergies Marjean Donna, CMA; 12/20/2014 9:58 AM) No Known Drug Allergies 12/20/2014  Medication History (Sonya Bynum, CMA; 12/20/2014 9:58 AM) Levothyroxine Sodium (100MCG Tablet, Oral) Active. Triamcinolone Acetonide (0.1% Cream, External) Active. Aspirin EC (81MG  Tablet DR, Oral) Active. Medications Reconciled  Social History Marjean Donna, CMA; 12/20/2014 9:57 AM) Alcohol use Occasional alcohol use. Caffeine use Coffee, Tea. No drug use Tobacco  use Never smoker.  Family History Marjean Donna, CMA; 12/20/2014 9:57 AM) Family history unknown First Degree Relatives  Pregnancy / Birth History Marjean Donna, CMA; 12/20/2014 9:57 AM) Age at menarche 29 years. Age of menopause 24-50 Contraceptive History Oral contraceptives. Gravida 3 Maternal age 51-25 Para 2     Review of Systems (Searcy; 12/20/2014 9:57 AM) General Present- Night Sweats. Not Present- Appetite Loss, Chills, Fatigue, Fever, Weight Gain and Weight Loss. HEENT Present- Wears glasses/contact lenses. Not Present- Earache, Hearing Loss, Hoarseness, Nose Bleed, Oral Ulcers, Ringing in the Ears, Seasonal Allergies, Sinus Pain, Sore Throat, Visual Disturbances and Yellow Eyes. Breast Present- Breast Mass. Not Present- Breast Pain, Nipple Discharge and Skin Changes. Female Genitourinary Present- Frequency. Not Present- Nocturia, Painful Urination, Pelvic Pain and Urgency. Musculoskeletal Present- Back Pain and Joint Pain. Not Present- Joint Stiffness, Muscle Pain, Muscle Weakness and Swelling of Extremities. Psychiatric Present- Change in Sleep Pattern. Not Present- Anxiety, Bipolar, Depression, Fearful and Frequent crying. Endocrine Present- Hair Changes and Hot flashes. Not Present- Cold Intolerance, Excessive Hunger, Heat Intolerance and New Diabetes.  Vitals (Sonya Bynum CMA; 12/20/2014 9:57 AM) 12/20/2014 9:57 AM Weight: 133 lb Height: 60in Body Surface Area: 1.6 m Body Mass Index: 25.97 kg/m Temp.: 97.54F(Temporal)  Pulse: 76 (Regular)  BP: 118/80 (Sitting, Left Arm, Standard)     Physical Exam ([place] Hanlon A. Kierston Plasencia MD; 12/20/2014 10:21 AM)  General Mental Status-Alert. General Appearance-Consistent with stated age. Hydration-Well hydrated. Voice-Normal.  Head and Neck Head-normocephalic, atraumatic with no lesions or palpable masses. Trachea-midline. Thyroid Gland Characteristics - normal size and  consistency.  Eye Eyeball - Bilateral-Extraocular movements intact. Sclera/Conjunctiva - Bilateral-No scleral icterus.  Chest and Lung Exam Chest and lung exam reveals -quiet, even and easy respiratory effort with no use of accessory muscles and on auscultation, normal breath sounds,  no adventitious sounds and normal vocal resonance. Inspection Chest Wall - Normal. Back - normal.  Abdomen Inspection Inspection of the abdomen reveals - No Hernias. Skin - Scar - no surgical scars. Palpation/Percussion Palpation and Percussion of the abdomen reveal - Soft, Non Tender, No Rebound tenderness, No Rigidity (guarding) and No hepatosplenomegaly. Auscultation Auscultation of the abdomen reveals - Bowel sounds normal.  Musculoskeletal Normal Exam - Left-Upper Extremity Strength Normal and Lower Extremity Strength Normal. Normal Exam - Right-Upper Extremity Strength Normal and Lower Extremity Strength Normal.  Lymphatic Head & Neck  Postauricular Nodes: Bilateral - Size - 2.0 cm. Consistency - Firm. Mobility - Mobile. Axillary  General Axillary Region: Left - Description - Generalized lymphadenopathy. Size - 2.0 cm. Right - Description - Generalized lymphadenopathy. Size - 2.0 cm. Femoral & Inguinal  Generalized Femoral & Inguinal Lymphatics: Left - Description - Generalized lymphadenopathy. Size - > 3.0 cm. Consistency - Firm. Right - Description - Generalized lymphadenopathy. Size - > 3.0 cm. Consistency - Firm.    Assessment & Plan (Dekari Bures A. Ishani Goldwasser MD; 12/20/2014 10:22 AM)  LYMPHADENOPATHY, GENERALIZED (785.6  R59.1) Impression: RECOMMEND ln BIOPSY TO EVALUATE FOR LYMPHOMA. RISK OF BLEEDING, INFECTION, LYMPH LEAK, NERVE INJURY, BLOOD VESSEL INJURY AND THE NEED FOR MORE SURGERY SHE AGREES  Current Plans Pt Education - Lymph Nodes, Enlarged: enlarged gland

## 2015-01-15 ENCOUNTER — Encounter (HOSPITAL_BASED_OUTPATIENT_CLINIC_OR_DEPARTMENT_OTHER): Payer: Self-pay | Admitting: *Deleted

## 2015-01-15 NOTE — Progress Notes (Signed)
To come in for labs

## 2015-01-16 ENCOUNTER — Encounter (HOSPITAL_BASED_OUTPATIENT_CLINIC_OR_DEPARTMENT_OTHER)
Admission: RE | Admit: 2015-01-16 | Discharge: 2015-01-16 | Disposition: A | Discharge status: Home / Self Care | Payer: Medicare HMO | Source: Ambulatory Visit | Attending: Surgery | Admitting: Surgery

## 2015-01-16 DIAGNOSIS — R591 Generalized enlarged lymph nodes: Secondary | ICD-10-CM | POA: Insufficient documentation

## 2015-01-16 DIAGNOSIS — Z01812 Encounter for preprocedural laboratory examination: Secondary | ICD-10-CM | POA: Insufficient documentation

## 2015-01-16 LAB — COMPREHENSIVE METABOLIC PANEL
ALT: 20 U/L (ref 0–35)
AST: 25 U/L (ref 0–37)
Albumin: 3.7 g/dL (ref 3.5–5.2)
Alkaline Phosphatase: 93 U/L (ref 39–117)
Anion gap: 11 (ref 5–15)
BUN: 19 mg/dL (ref 6–23)
CO2: 29 mmol/L (ref 19–32)
Calcium: 9.5 mg/dL (ref 8.4–10.5)
Chloride: 102 mmol/L (ref 96–112)
Creatinine, Ser: 1.03 mg/dL (ref 0.50–1.10)
GFR calc Af Amer: 61 mL/min — ABNORMAL LOW (ref >90)
GFR calc non Af Amer: 53 mL/min — ABNORMAL LOW (ref >90)
Glucose, Bld: 114 mg/dL — ABNORMAL HIGH (ref 70–99)
Potassium: 4 mmol/L (ref 3.5–5.1)
Sodium: 142 mmol/L (ref 135–145)
Total Bilirubin: 0.7 mg/dL (ref 0.3–1.2)
Total Protein: 6.9 g/dL (ref 6.0–8.3)

## 2015-01-16 LAB — CBC WITH DIFFERENTIAL/PLATELET
Basophils Absolute: 0 10*3/uL (ref 0.0–0.1)
Basophils Relative: 1 % (ref 0–1)
Eosinophils Absolute: 0.4 10*3/uL (ref 0.0–0.7)
Eosinophils Relative: 6 % — ABNORMAL HIGH (ref 0–5)
HCT: 41.5 % (ref 36.0–46.0)
Hemoglobin: 13.4 g/dL (ref 12.0–15.0)
Lymphocytes Relative: 15 % (ref 12–46)
Lymphs Abs: 0.9 10*3/uL (ref 0.7–4.0)
MCH: 28.8 pg (ref 26.0–34.0)
MCHC: 32.3 g/dL (ref 30.0–36.0)
MCV: 89.1 fL (ref 78.0–100.0)
Monocytes Absolute: 0.4 10*3/uL (ref 0.1–1.0)
Monocytes Relative: 7 % (ref 3–12)
Neutro Abs: 4.3 10*3/uL (ref 1.7–7.7)
Neutrophils Relative %: 71 % (ref 43–77)
Platelets: 185 10*3/uL (ref 150–400)
RBC: 4.66 MIL/uL (ref 3.87–5.11)
RDW: 14 % (ref 11.5–15.5)
WBC: 6 10*3/uL (ref 4.0–10.5)

## 2015-01-21 ENCOUNTER — Encounter (HOSPITAL_BASED_OUTPATIENT_CLINIC_OR_DEPARTMENT_OTHER)
Admission: RE | Disposition: A | Discharge status: Home / Self Care | Payer: Self-pay | Source: Ambulatory Visit | Attending: Surgery

## 2015-01-21 ENCOUNTER — Encounter (HOSPITAL_BASED_OUTPATIENT_CLINIC_OR_DEPARTMENT_OTHER): Payer: Self-pay | Admitting: *Deleted

## 2015-01-21 ENCOUNTER — Ambulatory Visit (HOSPITAL_BASED_OUTPATIENT_CLINIC_OR_DEPARTMENT_OTHER): Payer: Medicare HMO | Admitting: Anesthesiology

## 2015-01-21 ENCOUNTER — Ambulatory Visit (HOSPITAL_BASED_OUTPATIENT_CLINIC_OR_DEPARTMENT_OTHER)
Admission: RE | Admit: 2015-01-21 | Discharge: 2015-01-21 | Disposition: A | Discharge status: Home / Self Care | Payer: Medicare HMO | Source: Ambulatory Visit | Attending: Surgery | Admitting: Surgery

## 2015-01-21 DIAGNOSIS — R591 Generalized enlarged lymph nodes: Secondary | ICD-10-CM | POA: Diagnosis present

## 2015-01-21 DIAGNOSIS — I10 Essential (primary) hypertension: Secondary | ICD-10-CM | POA: Insufficient documentation

## 2015-01-21 DIAGNOSIS — M1990 Unspecified osteoarthritis, unspecified site: Secondary | ICD-10-CM | POA: Diagnosis not present

## 2015-01-21 DIAGNOSIS — Z79899 Other long term (current) drug therapy: Secondary | ICD-10-CM | POA: Diagnosis not present

## 2015-01-21 DIAGNOSIS — E039 Hypothyroidism, unspecified: Secondary | ICD-10-CM | POA: Insufficient documentation

## 2015-01-21 DIAGNOSIS — C8515 Unspecified B-cell lymphoma, lymph nodes of inguinal region and lower limb: Secondary | ICD-10-CM | POA: Diagnosis not present

## 2015-01-21 DIAGNOSIS — Z7952 Long term (current) use of systemic steroids: Secondary | ICD-10-CM | POA: Insufficient documentation

## 2015-01-21 DIAGNOSIS — Z7982 Long term (current) use of aspirin: Secondary | ICD-10-CM | POA: Diagnosis not present

## 2015-01-21 DIAGNOSIS — C8295 Follicular lymphoma, unspecified, lymph nodes of inguinal region and lower limb: Secondary | ICD-10-CM | POA: Diagnosis not present

## 2015-01-21 HISTORY — PX: LYMPH NODE BIOPSY: SHX201

## 2015-01-21 HISTORY — DX: Presence of dental prosthetic device (complete) (partial): Z97.2

## 2015-01-21 SURGERY — LYMPH NODE BIOPSY
Anesthesia: General | Site: Groin | Laterality: Right

## 2015-01-21 MED ORDER — MIDAZOLAM HCL 2 MG/2ML IJ SOLN
INTRAMUSCULAR | Status: AC
  Filled 2015-01-21: qty 2

## 2015-01-21 MED ORDER — OXYCODONE HCL 5 MG/5ML PO SOLN: 5.0000 mg | Freq: Once | ORAL | Status: DC | PRN

## 2015-01-21 MED ORDER — OXYCODONE HCL 5 MG PO TABS: 5.0000 mg | ORAL_TABLET | Freq: Once | ORAL | Status: DC | PRN

## 2015-01-21 MED ORDER — CEFAZOLIN SODIUM-DEXTROSE 2-3 GM-% IV SOLR
2.0000 g | INTRAVENOUS | Status: AC
  Administered 2015-01-21: 2 g via INTRAVENOUS

## 2015-01-21 MED ORDER — HYDROMORPHONE HCL 1 MG/ML IJ SOLN
0.2500 mg | INTRAMUSCULAR | Status: DC | PRN
  Administered 2015-01-21 (×2): 0.5 mg via INTRAVENOUS

## 2015-01-21 MED ORDER — LIDOCAINE HCL (CARDIAC) 20 MG/ML IV SOLN
INTRAVENOUS | Status: DC | PRN
  Administered 2015-01-21: 60 mg via INTRAVENOUS

## 2015-01-21 MED ORDER — HYDROMORPHONE HCL 1 MG/ML IJ SOLN
INTRAMUSCULAR | Status: AC
  Filled 2015-01-21: qty 1

## 2015-01-21 MED ORDER — FENTANYL CITRATE 0.05 MG/ML IJ SOLN
INTRAMUSCULAR | Status: DC | PRN
  Administered 2015-01-21: 50 ug via INTRAVENOUS

## 2015-01-21 MED ORDER — BUPIVACAINE-EPINEPHRINE (PF) 0.25% -1:200000 IJ SOLN
INTRAMUSCULAR | Status: AC
  Filled 2015-01-21: qty 30

## 2015-01-21 MED ORDER — HYDROCODONE-ACETAMINOPHEN 5-325 MG PO TABS: 1.0000 | ORAL_TABLET | Freq: Four times a day (QID) | ORAL | Status: DC | PRN

## 2015-01-21 MED ORDER — DEXAMETHASONE SODIUM PHOSPHATE 4 MG/ML IJ SOLN
INTRAMUSCULAR | Status: DC | PRN
  Administered 2015-01-21: 10 mg via INTRAVENOUS

## 2015-01-21 MED ORDER — BUPIVACAINE-EPINEPHRINE 0.25% -1:200000 IJ SOLN
INTRAMUSCULAR | Status: DC | PRN
  Administered 2015-01-21: 9 mL

## 2015-01-21 MED ORDER — FENTANYL CITRATE 0.05 MG/ML IJ SOLN
INTRAMUSCULAR | Status: AC
  Filled 2015-01-21: qty 4

## 2015-01-21 MED ORDER — LACTATED RINGERS IV SOLN
INTRAVENOUS | Status: DC
  Administered 2015-01-21: 11:00:00 via INTRAVENOUS

## 2015-01-21 MED ORDER — ONDANSETRON HCL 4 MG/2ML IJ SOLN
INTRAMUSCULAR | Status: DC | PRN
Start: 2015-01-21 — End: 2015-01-21
  Administered 2015-01-21: 4 mg via INTRAVENOUS

## 2015-01-21 MED ORDER — MIDAZOLAM HCL 2 MG/2ML IJ SOLN: 1.0000 mg | INTRAMUSCULAR | Status: DC | PRN

## 2015-01-21 MED ORDER — PROMETHAZINE HCL 25 MG/ML IJ SOLN: 6.2500 mg | INTRAMUSCULAR | Status: DC | PRN

## 2015-01-21 MED ORDER — FENTANYL CITRATE 0.05 MG/ML IJ SOLN: 50.0000 ug | INTRAMUSCULAR | Status: DC | PRN

## 2015-01-21 MED ORDER — PROPOFOL 10 MG/ML IV BOLUS
INTRAVENOUS | Status: DC | PRN
  Administered 2015-01-21: 150 mg via INTRAVENOUS

## 2015-01-21 SURGICAL SUPPLY — 39 items
APL SKNCLS STERI-STRIP NONHPOA (GAUZE/BANDAGES/DRESSINGS) ×1
BENZOIN TINCTURE PRP APPL 2/3 (GAUZE/BANDAGES/DRESSINGS) ×2 IMPLANT
BLADE SURG 15 STRL LF DISP TIS (BLADE) ×1 IMPLANT
BLADE SURG 15 STRL SS (BLADE) ×2
CANISTER SUCT 1200ML W/VALVE (MISCELLANEOUS) IMPLANT
CHLORAPREP W/TINT 26ML (MISCELLANEOUS) ×2 IMPLANT
CLIP TI WIDE RED SMALL 6 (CLIP) IMPLANT
COVER BACK TABLE 60X90IN (DRAPES) ×2 IMPLANT
COVER MAYO STAND STRL (DRAPES) ×2 IMPLANT
DECANTER SPIKE VIAL GLASS SM (MISCELLANEOUS) ×2 IMPLANT
DRAPE LAPAROTOMY 100X72 PEDS (DRAPES) ×2 IMPLANT
DRAPE UTILITY XL STRL (DRAPES) ×2 IMPLANT
ELECT COATED BLADE 2.86 ST (ELECTRODE) ×2 IMPLANT
ELECT REM PT RETURN 9FT ADLT (ELECTROSURGICAL) ×2
ELECTRODE REM PT RTRN 9FT ADLT (ELECTROSURGICAL) ×1 IMPLANT
GLOVE BIOGEL PI IND STRL 8 (GLOVE) ×1 IMPLANT
GLOVE BIOGEL PI INDICATOR 8 (GLOVE) ×1
GLOVE ECLIPSE 8.0 STRL XLNG CF (GLOVE) ×2 IMPLANT
GOWN STRL REUS W/ TWL LRG LVL3 (GOWN DISPOSABLE) ×2 IMPLANT
GOWN STRL REUS W/TWL LRG LVL3 (GOWN DISPOSABLE) ×4
LIQUID BAND (GAUZE/BANDAGES/DRESSINGS) ×1 IMPLANT
NDL HYPO 25X1 1.5 SAFETY (NEEDLE) ×1 IMPLANT
NEEDLE HYPO 25X1 1.5 SAFETY (NEEDLE) ×2 IMPLANT
NS IRRIG 1000ML POUR BTL (IV SOLUTION) ×2 IMPLANT
PACK BASIN DAY SURGERY FS (CUSTOM PROCEDURE TRAY) ×2 IMPLANT
PENCIL BUTTON HOLSTER BLD 10FT (ELECTRODE) ×2 IMPLANT
SLEEVE SCD COMPRESS KNEE MED (MISCELLANEOUS) ×1 IMPLANT
SPONGE LAP 4X18 X RAY DECT (DISPOSABLE) ×2 IMPLANT
STAPLER VISISTAT 35W (STAPLE) IMPLANT
STRIP CLOSURE SKIN 1/2X4 (GAUZE/BANDAGES/DRESSINGS) ×2 IMPLANT
SUT CHROMIC 3 0 SH 27 (SUTURE) IMPLANT
SUT MON AB 4-0 PC3 18 (SUTURE) ×2 IMPLANT
SUT VIC AB 3-0 SH 27 (SUTURE) ×2
SUT VIC AB 3-0 SH 27X BRD (SUTURE) IMPLANT
SYR CONTROL 10ML LL (SYRINGE) ×2 IMPLANT
TOWEL OR 17X24 6PK STRL BLUE (TOWEL DISPOSABLE) ×4 IMPLANT
TOWEL OR NON WOVEN STRL DISP B (DISPOSABLE) ×2 IMPLANT
TUBE CONNECTING 20X1/4 (TUBING) IMPLANT
YANKAUER SUCT BULB TIP NO VENT (SUCTIONS) IMPLANT

## 2015-01-21 NOTE — Op Note (Signed)
Preoperative diagnosis: Right inguinal lymphadenopathy  Postoperative diagnosis: Same  Procedure: Right inguinal lymph node biopsy superficial  Surgeon: Erroll Luna M.D.  Anesthesia: LMA with 0.25% Sensorcaine local  EBL: Minimal  Specimen: Right inguinal lymph nodes to pathology  IV fluids: 400 mL crystalloid  Drains: None  Indications for procedure: The patient presents for lymph node biopsy to exclude lymphoma due to generalized lymphadenopathy. Her most prominent lymph nodes are in the right inguinal and groin region. Excision of lymph node required or potential diagnosis and possible treatment. Risk, benefits and alternative means of biopsy discussed. Risk of bleeding, infection, lymphocele, lymphatic leak, lymphedema, nerve injury, blood vessel injury, numbness, and the need for other operative procedures discussed. She agrees to proceed.  Description of procedure: The patient was met in the holding area in the right groin was marked as the correct site. Questions are answered. She was taken back to the operating room where she was placed supine and LMA anesthesia initiated. The right groin was prepped and draped in a sterile fashion and timeout was done. She received antibiotics. Local anesthesia was infiltrated into the right inguinal crease and a 3 cm incision was made. Dissection was carried down and a 2 cm lymph node was identified which was enlarged and abnormal appearing and this was removed. This is one the larger lymph nodes in the basin. Hemostasis achieved and wound closed with 3-0 Vicryl and 4-0 Monocryl. Dermabond applied. All final counts sponge, needle and sponge found to be correct this portion the case. The patient was awoke, extubated and  taken to recovery in satisfactory condition.

## 2015-01-21 NOTE — Transfer of Care (Signed)
Immediate Anesthesia Transfer of Care Note  Patient: Carrie [name]  Procedure(s) Performed: Procedure(s): RIGHT GROIN LYMPH NODE BIOPSY (Right)  Patient Location: PACU  Anesthesia Type:General  Level of Consciousness: awake, sedated and patient cooperative  Airway & Oxygen Therapy: Patient Spontanous Breathing and Patient connected to face mask oxygen  Post-op Assessment: Report given to RN and Post -op Vital signs reviewed and stable  Post vital signs: Reviewed and stable  Last Vitals:  Filed Vitals:   01/21/15 1039  BP: 160/69  Pulse: 78  Temp: 36.5 C  Resp: 16    Complications: No apparent anesthesia complications

## 2015-01-21 NOTE — H&P (Signed)
H&P   Carrie [name] (MR# [id number])      H&P Info    Author Note Status Last Update User Last Update Date/Time   Erroll Luna, [name] Signed Erroll Luna, [name] 12/20/2014 10:23 AM    H&P    Expand All Collapse All   Carrie [name] 12/20/2014 9:56 AM Location: [facility] Patient #: [id number] DOB: [birth year] Widowed / Language: Cleophus [name] / Race: White Female History of Present Illness Carrie [name]; 12/20/2014 10:19 AM) Patient words: reck  GROIN LYMPHADENOPATHY.  The patient is a 74 year old female who presents with lymphadenopathy. The last clinic visit was 2 month(s) ago. No changes in management were made at the last visit. Symptoms include palpable lump and visible lump, while symptoms do not include fever or malaise. The lymphadenopathy is located in the left cervical region, in the left axillary region, in the left inguinal region, in the right axillary region and in the right inguinal region. The nodes are described as firm. Onset was gradual. There is no known event that preceded symptom onset. The patient describes this as mild and growing larger. Associated symptoms include night sweats. Other Problems Carrie [name], CMA; 12/20/2014 9:57 AM) Arthritis Back Pain Heart murmur Hemorrhoids Oophorectomy Bilateral. Thyroid Disease Transfusion history  Past Surgical History Carrie [name], CMA; 12/20/2014 9:57 AM) Appendectomy Foot Surgery Left. Hysterectomy (not due to cancer) - Complete Knee Surgery Bilateral. Nephrectomy Bilateral. Oral Surgery Spinal Surgery Midback  Diagnostic Studies History Carrie [name], CMA; 12/20/2014 9:57 AM) Colonoscopy 1-5 years ago Mammogram within last year Pap Smear 1-5 years ago  Allergies Carrie [name], CMA; 12/20/2014 9:58 AM) No Known Drug Allergies 12/20/2014  Medication History (Carrie [name], CMA; 12/20/2014 9:58 AM) Levothyroxine Sodium (100MCG Tablet, Oral) Active. Triamcinolone Acetonide (0.1% Cream,  External) Active. Aspirin EC (81MG  Tablet DR, Oral) Active. Medications Reconciled  Social History Carrie [name], CMA; 12/20/2014 9:57 AM) Alcohol use Occasional alcohol use. Caffeine use Coffee, Tea. No drug use Tobacco use Never smoker.  Family History Carrie [name], CMA; 12/20/2014 9:57 AM) Family history unknown First Degree Relatives  Pregnancy / Birth History Carrie [name], CMA; 12/20/2014 9:57 AM) Age at menarche 77 years. Age of menopause 28-50 Contraceptive History Oral contraceptives. Gravida 3 Maternal age 57-25 Para 2     Review of Systems ([place]; 12/20/2014 9:57 AM) General Present- Night Sweats. Not Present- Appetite Loss, Chills, Fatigue, Fever, Weight Gain and Weight Loss. HEENT Present- Wears glasses/contact lenses. Not Present- Earache, Hearing Loss, Hoarseness, Nose Bleed, Oral Ulcers, Ringing in the Ears, Seasonal Allergies, Sinus Pain, Sore Throat, Visual Disturbances and Yellow Eyes. Breast Present- Breast Mass. Not Present- Breast Pain, Nipple Discharge and Skin Changes. Female Genitourinary Present- Frequency. Not Present- Nocturia, Painful Urination, Pelvic Pain and Urgency. Musculoskeletal Present- Back Pain and Joint Pain. Not Present- Joint Stiffness, Muscle Pain, Muscle Weakness and Swelling of Extremities. Psychiatric Present- Change in Sleep Pattern. Not Present- Anxiety, Bipolar, Depression, Fearful and Frequent crying. Endocrine Present- Hair Changes and Hot flashes. Not Present- Cold Intolerance, Excessive Hunger, Heat Intolerance and New Diabetes.  Vitals (Carrie [name] CMA; 12/20/2014 9:57 AM) 12/20/2014 9:57 AM Weight: 133 lb Height: 60in Body Surface Area: 1.6 m Body Mass Index: 25.97 kg/m Temp.: 97.89F(Temporal)  Pulse: 76 (Regular)  BP: 118/80 (Sitting, Left Arm, Standard)     Physical Exam (Carrie [name]; 12/20/2014 10:21 AM)  General Mental Status-Alert. General Appearance-Consistent with stated  age. Hydration-Well hydrated. Voice-Normal.  Head and Neck Head-normocephalic, atraumatic with no lesions  or palpable masses. Trachea-midline. Thyroid Gland Characteristics - normal size and consistency.  Eye Eyeball - Bilateral-Extraocular movements intact. Sclera/Conjunctiva - Bilateral-No scleral icterus.  Chest and Lung Exam Chest and lung exam reveals -quiet, even and easy respiratory effort with no use of accessory muscles and on auscultation, normal breath sounds, no adventitious sounds and normal vocal resonance. Inspection Chest Wall - Normal. Back - normal.  Abdomen Inspection Inspection of the abdomen reveals - No Hernias. Skin - Scar - no surgical scars. Palpation/Percussion Palpation and Percussion of the abdomen reveal - Soft, Non Tender, No Rebound tenderness, No Rigidity (guarding) and No hepatosplenomegaly. Auscultation Auscultation of the abdomen reveals - Bowel sounds normal.  Musculoskeletal Normal Exam - Left-Upper Extremity Strength Normal and Lower Extremity Strength Normal. Normal Exam - Right-Upper Extremity Strength Normal and Lower Extremity Strength Normal.  Lymphatic Head & Neck  Postauricular Nodes: Bilateral - Size - 2.0 cm. Consistency - Firm. Mobility - Mobile. Axillary  General Axillary Region: Left - Description - Generalized lymphadenopathy. Size - 2.0 cm. Right - Description - Generalized lymphadenopathy. Size - 2.0 cm. Femoral & Inguinal  Generalized Femoral & Inguinal Lymphatics: Left - Description - Generalized lymphadenopathy. Size - > 3.0 cm. Consistency - Firm. Right - Description - Generalized lymphadenopathy. Size - > 3.0 cm. Consistency - Firm.    Assessment & Plan (Gillie Crisci A. Virdie Penning [name]; 12/20/2014 10:22 AM)  LYMPHADENOPATHY, GENERALIZED (785.6  R59.1) Impression: RECOMMEND ln BIOPSY TO EVALUATE FOR LYMPHOMA. RISK OF BLEEDING, INFECTION, LYMPH LEAK, NERVE INJURY, BLOOD VESSEL INJURY AND THE NEED FOR MORE  SURGERY SHE AGREES  Current Plans Pt Education - Lymph Nodes, Enlarged: enlarged gland

## 2015-01-21 NOTE — Anesthesia Postprocedure Evaluation (Signed)
  Anesthesia Post-op Note  Patient: Carrie [name]  Procedure(s) Performed: Procedure(s): RIGHT GROIN LYMPH NODE BIOPSY (Right)  Patient Location: PACU  Anesthesia Type:General  Level of Consciousness: awake and alert   Airway and Oxygen Therapy: Patient Spontanous Breathing  Post-op Pain: mild  Post-op Assessment: Post-op Vital signs reviewed  Post-op Vital Signs: stable  Last Vitals:  Filed Vitals:   01/21/15 1325  BP:   Pulse: 73  Temp:   Resp: 14    Complications: No apparent anesthesia complications

## 2015-01-21 NOTE — Interval H&P Note (Signed)
History and Physical Interval Note:  01/21/2015 11:42 AM  Carrie [name]  has presented today for surgery, with the diagnosis of Lymphadenopathy  The various methods of treatment have been discussed with the patient and family. After consideration of risks, benefits and other options for treatment, the patient has consented to  Procedure(s): RIGHT GROIN LYMPH NODE BIOPSY (Right) as a surgical intervention .  The patient's history has been reviewed, patient examined, no change in status, stable for surgery.  I have reviewed the patient's chart and labs.  Questions were answered to the patient's satisfaction.     [name]

## 2015-01-21 NOTE — Discharge Instructions (Signed)

## 2015-01-21 NOTE — Anesthesia Preprocedure Evaluation (Addendum)
Anesthesia Evaluation  Patient identified by MRN, date of birth, ID band Patient awake    Reviewed: Allergy & Precautions, Unable to perform ROS - Chart review only  History of Anesthesia Complications (+) history of anesthetic complications  Airway Mallampati: I  TM Distance: >3 FB Neck ROM: Full    Dental  (+) Teeth Intact   Pulmonary neg pulmonary ROS,  breath sounds clear to auscultation        Cardiovascular hypertension, Rhythm:Regular Rate:Normal     Neuro/Psych Cervical spondylosis    GI/Hepatic Neg liver ROS,   Endo/Other  Hypothyroidism   Renal/GU negative Renal ROS     Musculoskeletal  (+) Arthritis -,   Abdominal   Peds  Hematology   Anesthesia Other Findings   Reproductive/Obstetrics                           Anesthesia Physical Anesthesia Plan  ASA: II  Anesthesia Plan: General   Post-op Pain Management:    Induction: Intravenous  Airway Management Planned: LMA  Additional Equipment:   Intra-op Plan:   Post-operative Plan: Extubation in OR  Informed Consent: I have reviewed the patients History and Physical, chart, labs and discussed the procedure including the risks, benefits and alternatives for the proposed anesthesia with the patient or authorized representative who has indicated his/her understanding and acceptance.   Dental advisory given  Plan Discussed with: CRNA and Surgeon  Anesthesia Plan Comments:        Anesthesia Quick Evaluation

## 2015-01-21 NOTE — Anesthesia Procedure Notes (Signed)
Procedure Name: LMA Insertion Date/Time: 01/21/2015 12:05 PM Performed by: Lyndee Leo Pre-anesthesia Checklist: Patient identified, Emergency Drugs available, Suction available and Patient being monitored Patient Re-evaluated:Patient Re-evaluated prior to inductionOxygen Delivery Method: Circle System Utilized Preoxygenation: Pre-oxygenation with 100% oxygen Intubation Type: IV induction Ventilation: Mask ventilation without difficulty LMA: LMA inserted LMA Size: 4.0 Number of attempts: 1 Airway Equipment and Method: Bite block Placement Confirmation: positive ETCO2 Tube secured with: Tape Dental Injury: Teeth and Oropharynx as per pre-operative assessment

## 2015-01-22 ENCOUNTER — Encounter (HOSPITAL_BASED_OUTPATIENT_CLINIC_OR_DEPARTMENT_OTHER): Payer: Self-pay | Admitting: Surgery

## 2015-01-27 ENCOUNTER — Telehealth: Payer: Self-pay | Admitting: Hematology and Oncology

## 2015-01-27 NOTE — Telephone Encounter (Signed)
NEW PATIENT APPT-S/W PATIENT AND GAVE NP APP FOR 04/18 @ 2 W/DR. [place].

## 2015-01-27 NOTE — Telephone Encounter (Signed)
NEW PATIENT APPT-S/W PATIENT AND GAVE NP APPT FOR 04/18 @ 2 W/DR. [place]

## 2015-02-03 ENCOUNTER — Ambulatory Visit (HOSPITAL_BASED_OUTPATIENT_CLINIC_OR_DEPARTMENT_OTHER): Payer: Medicare HMO | Admitting: Hematology and Oncology

## 2015-02-03 ENCOUNTER — Encounter: Payer: Self-pay | Admitting: Hematology and Oncology

## 2015-02-03 ENCOUNTER — Telehealth: Payer: Self-pay | Admitting: *Deleted

## 2015-02-03 ENCOUNTER — Ambulatory Visit: Payer: Medicare HMO

## 2015-02-03 ENCOUNTER — Telehealth: Payer: Self-pay | Admitting: Hematology and Oncology

## 2015-02-03 VITALS — BP 141/81 | HR 85 | Temp 98.1°F | Resp 18 | Ht 61.0 in | Wt 133.1 lb

## 2015-02-03 DIAGNOSIS — C8238 Follicular lymphoma grade IIIa, lymph nodes of multiple sites: Secondary | ICD-10-CM | POA: Insufficient documentation

## 2015-02-03 DIAGNOSIS — E039 Hypothyroidism, unspecified: Secondary | ICD-10-CM

## 2015-02-03 DIAGNOSIS — C8230 Follicular lymphoma grade IIIa, unspecified site: Secondary | ICD-10-CM

## 2015-02-03 HISTORY — DX: Follicular lymphoma grade IIIa, unspecified site: C82.30

## 2015-02-03 NOTE — Telephone Encounter (Signed)
Per staff message and POF I have scheduled appts. Advised scheduler of appts. JMW  

## 2015-02-03 NOTE — Progress Notes (Signed)
[facility] CONSULT NOTE  Patient Care Team: Arvella Nigh, MD as PCP - General (Obstetrics and Gynecology)  CHIEF COMPLAINTS/PURPOSE OF CONSULTATION:  Newly diagnosed follicular lymphoma grade 3  HISTORY OF PRESENTING ILLNESS:  Carrie [name] 74 y.o. female is here because of recent diagnosis of lymphoma. The patient had ongoing night sweats for many years, occasionally drenching at nighttime. 2 months ago, she started to notice new onset of bilateral inguinal lymphadenopathy, confirmed on physical examination by her primary care provider. She was subsequently referred to general surgery underwent excisional lymph node biopsy that confirmed the diagnosis of high-grade lymphoma. Since then, she has new lymphadenopathy almost on a weekly basis. She denies anorexia or weight loss but have regular skin itching  MEDICAL HISTORY:  Past Medical History  Diagnosis Date  . DJD (degenerative joint disease)   . Hypothyroid   . Cervical spondylosis without myelopathy 10/25/2013  . Dental bridge present   . Follicular lymphoma grade 3a 02/03/2015    SURGICAL HISTORY: Past Surgical History  Procedure Laterality Date  . Patelar tendon transplants      Left/right  . Abdominal hysterectomy    . Dilation and curettage of uterus    . Total knee arthroplasty  2011    Right  . Appendectomy    . Ovarian cyst resection    . Back surgery      X5-lumbar-fusion  . Lymph node biopsy Right 01/21/2015    Procedure: RIGHT GROIN LYMPH NODE BIOPSY;  Surgeon: Erroll Luna, MD;  Location: [facility];  Service: General;  Laterality: Right;  . Colonoscopy      SOCIAL HISTORY: History   Social History  . Marital Status: Widowed    Spouse Name: N/A  . Number of Children: 2  . Years of Education: hs   Occupational History  . retired    Social History Main Topics  . Smoking status: Never Smoker   . Smokeless tobacco: Never Used  . Alcohol Use: Yes     Comment:  occassional wine  . Drug Use: No  . Sexual Activity: Not on file   Other Topics Concern  . Not on file   Social History Narrative    FAMILY HISTORY: Family History  Problem Relation Age of Onset  . Cancer Mother     Breast, lung NHL  . Cancer Sister     Multiple myeloma    ALLERGIES:  has No Known Allergies.  MEDICATIONS:  Current Outpatient Prescriptions  Medication Sig Dispense Refill  . aspirin 81 MG tablet Take 81 mg by mouth daily.    Marland Kitchen HYDROcodone-acetaminophen (NORCO) 5-325 MG per tablet Take 1 tablet by mouth every 6 (six) hours as needed for moderate pain. 30 tablet 0  . levothyroxine (SYNTHROID, LEVOTHROID) 75 MCG tablet Take 75 mcg by mouth daily.    . naproxen sodium (ANAPROX) 220 MG tablet Take 220 mg by mouth 2 (two) times daily as needed.     No current facility-administered medications for this visit.    REVIEW OF SYSTEMS:   Eyes: Denies blurriness of vision, double vision or watery eyes Ears, nose, mouth, throat, and face: Denies mucositis or sore throat Respiratory: Denies cough, dyspnea or wheezes Cardiovascular: Denies palpitation, chest discomfort or lower extremity swelling Gastrointestinal:  Denies nausea, heartburn or change in bowel habits Skin: Denies abnormal skin rashes Neurological:Denies numbness, tingling or new weaknesses Behavioral/Psych: Mood is stable, no new changes  All other systems were reviewed with the patient and are  negative.  PHYSICAL EXAMINATION: ECOG PERFORMANCE STATUS: 1 - Symptomatic but completely ambulatory  Filed Vitals:   02/03/15 1339  BP: 141/81  Pulse: 85  Temp: 98.1 F (36.7 C)  Resp: 18   Filed Weights   02/03/15 1339  Weight: 133 lb 1.6 oz (60.374 kg)    GENERAL:alert, no distress and comfortable SKIN: skin color, texture, turgor are normal, no rashes or significant lesions EYES: normal, conjunctiva are pink and non-injected, sclera clear OROPHARYNX:no exudate, no erythema and lips, buccal mucosa,  and tongue normal  NECK: supple, thyroid normal size, non-tender, without nodularity LYMPH:  She has diffuse lymphadenopathy in the head and neck region, supraclavicular region, bilateral axillary and inguinal region.  LUNGS: clear to auscultation and percussion with normal breathing effort HEART: regular rate & rhythm and no murmurs and no lower extremity edema ABDOMEN:abdomen soft, non-tender and normal bowel sounds Musculoskeletal:no cyanosis of digits and no clubbing  PSYCH: alert & oriented x 3 with fluent speech NEURO: no focal motor/sensory deficits  LABORATORY DATA:  I have reviewed the data as listed Lab Results  Component Value Date   WBC 6.0 01/16/2015   HGB 13.4 01/16/2015   HCT 41.5 01/16/2015   MCV 89.1 01/16/2015   PLT 185 01/16/2015    Recent Labs  01/16/15 1130  NA 142  K 4.0  CL 102  CO2 29  GLUCOSE 114*  BUN 19  CREATININE 1.03  CALCIUM 9.5  GFRNONAA 53*  GFRAA 61*  PROT 6.9  ALBUMIN 3.7  AST 25  ALT 20  ALKPHOS 93  BILITOT 0.7    ASSESSMENT & PLAN:  Follicular lymphoma grade 3a I has a very long discussion with the patient and family members. I will proceed to order staging PET/CT scan, bone marrow biopsy, blood work, chemotherapy education class, echocardiogram and receptionist for potential participation in clinical trial. I explained to her in great detail the procedure regarding the bone marrow biopsy and she would like to proceed unsedated. I will refer her back to surgeon for port placement. Tentatively, I plan to see her back next week to review all test results and chemotherapy consent. I plan to start her on cycle 1 of chemotherapy with RCHOP on 02/17/2015 if possible.      All questions were answered. The patient knows to call the clinic with any problems, questions or concerns. I spent 60 minutes counseling the patient face to face. The total time spent in the appointment was 80 minutes and more than 50% was on counseling.      [facility], [place], MD 02/03/2015 3:21 PM

## 2015-02-03 NOTE — Telephone Encounter (Signed)
gave and printed appt sched and avs fo rpt for April and May....sed and MW added tx....emailed LD for echo auth

## 2015-02-03 NOTE — Assessment & Plan Note (Signed)
I has a very long discussion with the patient and family members. I will proceed to order staging PET/CT scan, bone marrow biopsy, blood work, chemotherapy education class, echocardiogram and receptionist for potential participation in clinical trial. I explained to her in great detail the procedure regarding the bone marrow biopsy and she would like to proceed unsedated. I will refer her back to surgeon for port placement. Tentatively, I plan to see her back next week to review all test results and chemotherapy consent. I plan to start her on cycle 1 of chemotherapy with RCHOP on 02/17/2015 if possible.

## 2015-02-03 NOTE — Progress Notes (Signed)
Checked in new pt with no financial concerns. °

## 2015-02-04 ENCOUNTER — Encounter: Payer: Self-pay | Admitting: *Deleted

## 2015-02-04 DIAGNOSIS — C8230 Follicular lymphoma grade IIIa, unspecified site: Secondary | ICD-10-CM

## 2015-02-05 ENCOUNTER — Ambulatory Visit: Payer: Self-pay | Admitting: Surgery

## 2015-02-05 ENCOUNTER — Other Ambulatory Visit: Payer: Self-pay | Admitting: *Deleted

## 2015-02-07 ENCOUNTER — Encounter: Payer: Self-pay | Admitting: *Deleted

## 2015-02-07 ENCOUNTER — Other Ambulatory Visit: Payer: Self-pay | Admitting: *Deleted

## 2015-02-07 ENCOUNTER — Other Ambulatory Visit: Payer: Medicare HMO

## 2015-02-07 ENCOUNTER — Encounter: Payer: Medicare HMO | Admitting: *Deleted

## 2015-02-07 ENCOUNTER — Other Ambulatory Visit: Payer: Self-pay | Admitting: Hematology and Oncology

## 2015-02-07 ENCOUNTER — Ambulatory Visit (HOSPITAL_COMMUNITY)
Admission: RE | Admit: 2015-02-07 | Discharge: 2015-02-07 | Disposition: A | Discharge status: Home / Self Care | Payer: Medicare HMO | Source: Ambulatory Visit | Attending: Hematology and Oncology | Admitting: Hematology and Oncology

## 2015-02-07 DIAGNOSIS — C8590 Non-Hodgkin lymphoma, unspecified, unspecified site: Secondary | ICD-10-CM

## 2015-02-07 DIAGNOSIS — C8230 Follicular lymphoma grade IIIa, unspecified site: Secondary | ICD-10-CM

## 2015-02-07 DIAGNOSIS — Z01810 Encounter for preprocedural cardiovascular examination: Secondary | ICD-10-CM | POA: Diagnosis not present

## 2015-02-07 DIAGNOSIS — Z01818 Encounter for other preprocedural examination: Secondary | ICD-10-CM | POA: Diagnosis not present

## 2015-02-07 LAB — RESEARCH LABS

## 2015-02-07 MED ORDER — LIDOCAINE-PRILOCAINE 2.5-2.5 % EX CREA: 1.0000 "application " | TOPICAL_CREAM | CUTANEOUS | Status: DC | PRN

## 2015-02-07 NOTE — Progress Notes (Signed)
Echocardiogram 2D Echocardiogram has been performed.  Carrie [name] 02/07/2015, 11:46 AM

## 2015-02-11 ENCOUNTER — Other Ambulatory Visit: Payer: Self-pay | Admitting: *Deleted

## 2015-02-11 ENCOUNTER — Ambulatory Visit (HOSPITAL_BASED_OUTPATIENT_CLINIC_OR_DEPARTMENT_OTHER): Payer: Medicare HMO | Admitting: Hematology and Oncology

## 2015-02-11 ENCOUNTER — Other Ambulatory Visit (HOSPITAL_COMMUNITY)
Admission: RE | Admit: 2015-02-11 | Discharge: 2015-02-11 | Disposition: A | Discharge status: Home / Self Care | Payer: Medicare HMO | Source: Ambulatory Visit | Attending: Hematology and Oncology | Admitting: Hematology and Oncology

## 2015-02-11 ENCOUNTER — Other Ambulatory Visit: Payer: Self-pay | Admitting: Hematology and Oncology

## 2015-02-11 ENCOUNTER — Encounter: Payer: Self-pay | Admitting: Hematology and Oncology

## 2015-02-11 ENCOUNTER — Ambulatory Visit (HOSPITAL_COMMUNITY)
Admission: RE | Admit: 2015-02-11 | Discharge: 2015-02-11 | Disposition: A | Discharge status: Home / Self Care | Payer: Medicare HMO | Source: Ambulatory Visit | Attending: Hematology and Oncology | Admitting: Hematology and Oncology

## 2015-02-11 ENCOUNTER — Other Ambulatory Visit (HOSPITAL_BASED_OUTPATIENT_CLINIC_OR_DEPARTMENT_OTHER): Payer: Medicare HMO

## 2015-02-11 VITALS — BP 136/79 | HR 72 | Temp 98.0°F | Resp 16

## 2015-02-11 DIAGNOSIS — Z79899 Other long term (current) drug therapy: Secondary | ICD-10-CM | POA: Diagnosis not present

## 2015-02-11 DIAGNOSIS — Z7982 Long term (current) use of aspirin: Secondary | ICD-10-CM | POA: Diagnosis not present

## 2015-02-11 DIAGNOSIS — C8230 Follicular lymphoma grade IIIa, unspecified site: Secondary | ICD-10-CM

## 2015-02-11 DIAGNOSIS — E038 Other specified hypothyroidism: Secondary | ICD-10-CM

## 2015-02-11 DIAGNOSIS — M1990 Unspecified osteoarthritis, unspecified site: Secondary | ICD-10-CM | POA: Diagnosis not present

## 2015-02-11 DIAGNOSIS — E039 Hypothyroidism, unspecified: Secondary | ICD-10-CM | POA: Diagnosis not present

## 2015-02-11 DIAGNOSIS — C8590 Non-Hodgkin lymphoma, unspecified, unspecified site: Secondary | ICD-10-CM | POA: Diagnosis present

## 2015-02-11 DIAGNOSIS — Z7952 Long term (current) use of systemic steroids: Secondary | ICD-10-CM | POA: Diagnosis not present

## 2015-02-11 DIAGNOSIS — I10 Essential (primary) hypertension: Secondary | ICD-10-CM | POA: Diagnosis not present

## 2015-02-11 LAB — CBC WITH DIFFERENTIAL/PLATELET
BASO%: 0.6 % (ref 0.0–2.0)
Basophils Absolute: 0 10*3/uL (ref 0.0–0.1)
EOS%: 4.6 % (ref 0.0–7.0)
Eosinophils Absolute: 0.3 10*3/uL (ref 0.0–0.5)
HCT: 40 % (ref 34.8–46.6)
HGB: 13 g/dL (ref 11.6–15.9)
LYMPH%: 10.9 % — ABNORMAL LOW (ref 14.0–49.7)
MCH: 28.2 pg (ref 25.1–34.0)
MCHC: 32.5 g/dL (ref 31.5–36.0)
MCV: 86.8 fL (ref 79.5–101.0)
MONO#: 0.5 10*3/uL (ref 0.1–0.9)
MONO%: 8.9 % (ref 0.0–14.0)
NEUT#: 4.2 10*3/uL (ref 1.5–6.5)
NEUT%: 75 % (ref 38.4–76.8)
Platelets: 178 10*3/uL (ref 145–400)
RBC: 4.61 10*6/uL (ref 3.70–5.45)
RDW: 14.4 % (ref 11.2–14.5)
WBC: 5.7 10*3/uL (ref 3.9–10.3)
lymph#: 0.6 10*3/uL — ABNORMAL LOW (ref 0.9–3.3)

## 2015-02-11 LAB — COMPREHENSIVE METABOLIC PANEL (CC13)
ALT: 16 U/L (ref 0–55)
AST: 20 U/L (ref 5–34)
Albumin: 3.7 g/dL (ref 3.5–5.0)
Alkaline Phosphatase: 94 U/L (ref 40–150)
Anion Gap: 12 mEq/L — ABNORMAL HIGH (ref 3–11)
BUN: 19.4 mg/dL (ref 7.0–26.0)
CO2: 24 mEq/L (ref 22–29)
Calcium: 9.1 mg/dL (ref 8.4–10.4)
Chloride: 106 mEq/L (ref 98–109)
Creatinine: 0.9 mg/dL (ref 0.6–1.1)
EGFR: 66 mL/min/{1.73_m2} — ABNORMAL LOW (ref >90)
Glucose: 86 mg/dl (ref 70–140)
Potassium: 4.1 mEq/L (ref 3.5–5.1)
Sodium: 142 mEq/L (ref 136–145)
Total Bilirubin: 0.34 mg/dL (ref 0.20–1.20)
Total Protein: 6.4 g/dL (ref 6.4–8.3)

## 2015-02-11 LAB — GLUCOSE, CAPILLARY: Glucose-Capillary: 92 mg/dL (ref 70–99)

## 2015-02-11 LAB — HEPATITIS B SURFACE ANTIBODY,QUALITATIVE: Hep B S Ab: NEGATIVE

## 2015-02-11 LAB — LACTATE DEHYDROGENASE (CC13): LDH: 232 U/L (ref 125–245)

## 2015-02-11 LAB — T4, FREE: Free T4: 1.56 ng/dL (ref 0.80–1.80)

## 2015-02-11 LAB — BONE MARROW EXAM

## 2015-02-11 LAB — URIC ACID (CC13): Uric Acid, Serum: 5.3 mg/dl (ref 2.6–7.4)

## 2015-02-11 LAB — HEPATITIS B SURFACE ANTIGEN: Hepatitis B Surface Ag: NEGATIVE

## 2015-02-11 LAB — HEPATITIS B CORE ANTIBODY, IGM: Hep B C IgM: NONREACTIVE

## 2015-02-11 MED ORDER — FLUDEOXYGLUCOSE F - 18 (FDG) INJECTION
6.6400 | Freq: Once | INTRAVENOUS | Status: AC | PRN
  Administered 2015-02-11: 6.64 via INTRAVENOUS

## 2015-02-11 NOTE — Progress Notes (Signed)
[facility] OFFICE PROGRESS NOTE  Patient Care Team: Arvella Nigh, MD as PCP - General (Obstetrics and Gynecology)  SUMMARY OF ONCOLOGIC HISTORY:   Follicular lymphoma grade 3a   01/21/2015 Surgery She underwent excisional lymph node biopsy that came back follicular lymphoma grade 3   01/21/2015 Pathology Results Accession: [id number] biopsy confirmed follicular lymphoma   6/60/6301 Imaging Echocardiogram showed ejection fraction of 55-60%    INTERVAL HISTORY: Please see below for problem oriented charting. She returns today for further review of test results and bone marrow biopsy.  REVIEW OF SYSTEMS:   Constitutional: Denies fevers, chills or abnormal weight loss Eyes: Denies blurriness of vision Ears, nose, mouth, throat, and face: Denies mucositis or sore throat Respiratory: Denies cough, dyspnea or wheezes Cardiovascular: Denies palpitation, chest discomfort or lower extremity swelling Gastrointestinal:  Denies nausea, heartburn or change in bowel habits Skin: Denies abnormal skin rashes Lymphatics: Denies new lymphadenopathy or easy bruising Neurological:Denies numbness, tingling or new weaknesses Behavioral/Psych: Mood is stable, no new changes  All other systems were reviewed with the patient and are negative.  I have reviewed the past medical history, past surgical history, social history and family history with the patient and they are unchanged from previous note.  ALLERGIES:  has No Known Allergies.  MEDICATIONS:  Current Outpatient Prescriptions  Medication Sig Dispense Refill  . aspirin 81 MG tablet Take 81 mg by mouth daily.    Marland Kitchen HYDROcodone-acetaminophen (NORCO) 5-325 MG per tablet Take 1 tablet by mouth every 6 (six) hours as needed for moderate pain. (Patient not taking: Reported on 02/06/2015) 30 tablet 0  . levothyroxine (SYNTHROID, LEVOTHROID) 75 MCG tablet Take 75 mcg by mouth daily.    Marland Kitchen lidocaine-prilocaine (EMLA) cream Apply 1 application  topically as needed. Apply to port a cath site one hour prior to needle stick 30 g 2  . triamcinolone ointment (KENALOG) 0.1 % Apply 1 application topically 2 (two) times daily as needed (Itching).     No current facility-administered medications for this visit.    PHYSICAL EXAMINATION: ECOG PERFORMANCE STATUS: 0 - Asymptomatic  Filed Vitals:   02/11/15 0850  BP: 136/79  Pulse: 72  Temp:   Resp: 16   LABORATORY DATA:  I have reviewed the data as listed    Component Value Date/Time   NA 142 02/11/2015 0834   NA 142 01/16/2015 1130   K 4.1 02/11/2015 0834   K 4.0 01/16/2015 1130   CL 102 01/16/2015 1130   CO2 24 02/11/2015 0834   CO2 29 01/16/2015 1130   GLUCOSE 86 02/11/2015 0834   GLUCOSE 114* 01/16/2015 1130   BUN 19.4 02/11/2015 0834   BUN 19 01/16/2015 1130   CREATININE 0.9 02/11/2015 0834   CREATININE 1.03 01/16/2015 1130   CALCIUM 9.1 02/11/2015 0834   CALCIUM 9.5 01/16/2015 1130   PROT 6.4 02/11/2015 0834   PROT 6.9 01/16/2015 1130   ALBUMIN 3.7 02/11/2015 0834   ALBUMIN 3.7 01/16/2015 1130   AST 20 02/11/2015 0834   AST 25 01/16/2015 1130   ALT 16 02/11/2015 0834   ALT 20 01/16/2015 1130   ALKPHOS 94 02/11/2015 0834   ALKPHOS 93 01/16/2015 1130   BILITOT 0.34 02/11/2015 0834   BILITOT 0.7 01/16/2015 1130   GFRNONAA 53* 01/16/2015 1130   GFRAA 61* 01/16/2015 1130    No results found for: SPEP, UPEP  Lab Results  Component Value Date   WBC 5.7 02/11/2015   NEUTROABS 4.2 02/11/2015   HGB 13.0  02/11/2015   HCT 40.0 02/11/2015   MCV 86.8 02/11/2015   PLT 178 02/11/2015      Chemistry      Component Value Date/Time   NA 142 02/11/2015 0834   NA 142 01/16/2015 1130   K 4.1 02/11/2015 0834   K 4.0 01/16/2015 1130   CL 102 01/16/2015 1130   CO2 24 02/11/2015 0834   CO2 29 01/16/2015 1130   BUN 19.4 02/11/2015 0834   BUN 19 01/16/2015 1130   CREATININE 0.9 02/11/2015 0834   CREATININE 1.03 01/16/2015 1130      Component Value Date/Time    CALCIUM 9.1 02/11/2015 0834   CALCIUM 9.5 01/16/2015 1130   ALKPHOS 94 02/11/2015 0834   ALKPHOS 93 01/16/2015 1130   AST 20 02/11/2015 0834   AST 25 01/16/2015 1130   ALT 16 02/11/2015 0834   ALT 20 01/16/2015 1130   BILITOT 0.34 02/11/2015 0834   BILITOT 0.7 01/16/2015 1130       RADIOGRAPHIC STUDIES: I reviewed her most recent echo result I have personally reviewed the radiological images as listed and agreed with the findings in the report.  ASSESSMENT & PLAN:  Follicular lymphoma grade 3a We discussed the rationale off pursuing bone marrow aspirate and biopsy. She agreed to proceed. Brief examination was performed. ENT: adequate airway clearance Heart: regular rate and rhythm.No Murmurs Lungs: clear to auscultation, no wheezes, normal respiratory effort Bone Marrow Biopsy and Aspiration Procedure Note   Informed consent was obtained and potential risks including bleeding, infection and pain were reviewed with the patient.  The patient's name, date of birth, identification, consent and allergies were verified prior to the start of procedure and time out was performed.  The right posterior iliac crest was chosen as the site of biopsy.  The skin was prepped with Betadine solution.   8 cc of 1% lidocaine was used to provide local anaesthesia.   10 cc of bone marrow aspirate was obtained followed by 1 inch biopsy.   The procedure was tolerated well and there were no complications.  The patient was stable at the end of the procedure.  Specimens sent for flow cytometry, cytogenetics and additional studies.     No orders of the defined types were placed in this encounter.   All questions were answered. The patient knows to call the clinic with any problems, questions or concerns. No barriers to learning was detected. I spent 25 minutes counseling the patient face to face. The total time spent in the appointment was 30 minutes and more than 50% was on counseling and  review of test results     [facility], [place], MD 02/11/2015 10:26 AM

## 2015-02-11 NOTE — Assessment & Plan Note (Signed)
We discussed the rationale off pursuing bone marrow aspirate and biopsy. She agreed to proceed. Brief examination was performed. ENT: adequate airway clearance Heart: regular rate and rhythm.No Murmurs Lungs: clear to auscultation, no wheezes, normal respiratory effort Bone Marrow Biopsy and Aspiration Procedure Note   Informed consent was obtained and potential risks including bleeding, infection and pain were reviewed with the patient.  The patient's name, date of birth, identification, consent and allergies were verified prior to the start of procedure and time out was performed.  The right posterior iliac crest was chosen as the site of biopsy.  The skin was prepped with Betadine solution.   8 cc of 1% lidocaine was used to provide local anaesthesia.   10 cc of bone marrow aspirate was obtained followed by 1 inch biopsy.   The procedure was tolerated well and there were no complications.  The patient was stable at the end of the procedure.  Specimens sent for flow cytometry, cytogenetics and additional studies.

## 2015-02-11 NOTE — Patient Instructions (Signed)
Bone Marrow Aspiration, Bone Marrow Biopsy  Care After  Read the instructions outlined below and refer to this sheet in the next few weeks. These discharge instructions provide you with general information on caring for yourself after you leave the hospital. Your caregiver may also give you specific instructions. While your treatment has been planned according to the most current medical practices available, unavoidable complications occasionally occur. If you have any problems or questions after discharge, call your caregiver.  FINDING OUT THE RESULTS OF YOUR TEST  Not all test results are available during your visit. If your test results are not back during the visit, make an appointment with your caregiver to find out the results. Do not assume everything is normal if you have not heard from your caregiver or the medical facility. It is important for you to follow up on all of your test results.   HOME CARE INSTRUCTIONS   You have had sedation and may be sleepy or dizzy. Your thinking may not be as clear as usual. For the next 24 hours:  · Only take over-the-counter or prescription medicines for pain, discomfort, and or fever as directed by your caregiver.  · Do not drink alcohol.  · Do not smoke.  · Do not drive.  · Do not make important legal decisions.  · Do not operate heavy machinery.  · Do not care for small children by yourself.  · Keep your dressing clean and dry. You may replace dressing with a bandage after 24 hours.  · You may take a bath or shower after 24 hours.  · Use an ice pack for 20 minutes every 2 hours while awake for pain as needed.  SEEK MEDICAL CARE IF:   · There is redness, swelling, or increasing pain at the biopsy site.  · There is pus coming from the biopsy site.  · There is drainage from a biopsy site lasting longer than one day.  · An unexplained oral temperature above 102° F (38.9° C) develops.  SEEK IMMEDIATE MEDICAL CARE IF:   · You develop a rash.  · You have difficulty  breathing.  · You develop any reaction or side effects to medications given.  Document Released: 04/23/2005 Document Revised: 12/27/2011 Document Reviewed: 10/01/2008  ExitCare® Patient Information ©2015 [employer]. This information is not intended to replace advice given to you by your health care provider. Make sure you discuss any questions you have with your health care provider.

## 2015-02-12 ENCOUNTER — Encounter (HOSPITAL_COMMUNITY): Payer: Self-pay | Admitting: *Deleted

## 2015-02-12 ENCOUNTER — Other Ambulatory Visit: Payer: Medicare HMO

## 2015-02-12 ENCOUNTER — Telehealth: Payer: Self-pay | Admitting: Hematology and Oncology

## 2015-02-12 ENCOUNTER — Ambulatory Visit (HOSPITAL_BASED_OUTPATIENT_CLINIC_OR_DEPARTMENT_OTHER): Payer: Medicare HMO | Admitting: Hematology and Oncology

## 2015-02-12 VITALS — BP 135/73 | HR 71 | Temp 98.0°F | Resp 19 | Ht 61.0 in | Wt 133.1 lb

## 2015-02-12 DIAGNOSIS — C8230 Follicular lymphoma grade IIIa, unspecified site: Secondary | ICD-10-CM | POA: Diagnosis not present

## 2015-02-12 MED ORDER — ONDANSETRON HCL 8 MG PO TABS: 8.0000 mg | ORAL_TABLET | Freq: Two times a day (BID) | ORAL | Status: DC

## 2015-02-12 MED ORDER — ALLOPURINOL 300 MG PO TABS: 300.0000 mg | ORAL_TABLET | Freq: Every day | ORAL | Status: DC

## 2015-02-12 MED ORDER — CEFAZOLIN SODIUM-DEXTROSE 2-3 GM-% IV SOLR
2.0000 g | INTRAVENOUS | Status: AC
  Administered 2015-02-13: 2 g via INTRAVENOUS
  Filled 2015-02-12: qty 50

## 2015-02-12 MED ORDER — PROCHLORPERAZINE MALEATE 10 MG PO TABS: 10.0000 mg | ORAL_TABLET | Freq: Four times a day (QID) | ORAL | Status: DC | PRN

## 2015-02-12 MED ORDER — PREDNISONE 20 MG PO TABS: 40.0000 mg/m2 | ORAL_TABLET | Freq: Every day | ORAL | Status: DC

## 2015-02-12 NOTE — Telephone Encounter (Signed)
Pt confirmed labs/ov per 04/27 POF, gave pt AVS and Calendar..... KJ  °

## 2015-02-12 NOTE — Progress Notes (Signed)
[facility] OFFICE PROGRESS NOTE  Patient Care Team: Arvella Nigh, MD as PCP - General (Obstetrics and Gynecology)  SUMMARY OF ONCOLOGIC HISTORY:   Follicular lymphoma grade 3a   01/21/2015 Surgery She underwent excisional lymph node biopsy that came back follicular lymphoma grade 3   01/21/2015 Pathology Results Accession: [id number] biopsy confirmed follicular lymphoma   1/91/4782 Imaging Echocardiogram showed ejection fraction of 55-60%   02/11/2015 Imaging  PET CT scan show possible splenic involvement and diffuse lymphadenopathy throughout   02/11/2015 Bone Marrow Biopsy  bone marrow biopsy was performed.    INTERVAL HISTORY: Please see below for problem oriented charting. She returns today to review test results. In the meantime, she feels well apart from vague lower abdominal discomfort. She tolerated bone marrow biopsy well.  REVIEW OF SYSTEMS:   Constitutional: Denies fevers, chills or abnormal weight loss Eyes: Denies blurriness of vision Ears, nose, mouth, throat, and face: Denies mucositis or sore throat Respiratory: Denies cough, dyspnea or wheezes Cardiovascular: Denies palpitation, chest discomfort or lower extremity swelling Gastrointestinal:  Denies nausea, heartburn or change in bowel habits Skin: Denies abnormal skin rashes Neurological:Denies numbness, tingling or new weaknesses Behavioral/Psych: Mood is stable, no new changes  All other systems were reviewed with the patient and are negative.  I have reviewed the past medical history, past surgical history, social history and family history with the patient and they are unchanged from previous note.  ALLERGIES:  has No Known Allergies.  MEDICATIONS:  Current Outpatient Prescriptions  Medication Sig Dispense Refill  . acetaminophen (TYLENOL) 325 MG tablet Take 650 mg by mouth every 6 (six) hours as needed.    Marland Kitchen levothyroxine (SYNTHROID, LEVOTHROID) 75 MCG tablet Take 100 mcg by mouth daily.     .  Multiple Vitamins-Minerals (AIRBORNE GUMMIES PO) Take by mouth.    . triamcinolone ointment (KENALOG) 0.1 % Apply 1 application topically 2 (two) times daily as needed (Itching).    Marland Kitchen allopurinol (ZYLOPRIM) 300 MG tablet Take 1 tablet (300 mg total) by mouth daily. 30 tablet 0  . aspirin 81 MG tablet Take 81 mg by mouth daily.    Marland Kitchen lidocaine-prilocaine (EMLA) cream Apply 1 application topically as needed. Apply to port a cath site one hour prior to needle stick (Patient not taking: Reported on 02/12/2015) 30 g 2  . ondansetron (ZOFRAN) 8 MG tablet Take 1 tablet (8 mg total) by mouth 2 (two) times daily. 30 tablet 1  . Oxycodone-Acetaminophen ER 7.5-325 MG TBCR Take by mouth.    . predniSONE (DELTASONE) 20 MG tablet Take 3 tablets (60 mg total) by mouth daily. 30 tablet 1  . prochlorperazine (COMPAZINE) 10 MG tablet Take 1 tablet (10 mg total) by mouth every 6 (six) hours as needed (Nausea or vomiting). 30 tablet 6   No current facility-administered medications for this visit.    PHYSICAL EXAMINATION: ECOG PERFORMANCE STATUS: 0 - Asymptomatic  Filed Vitals:   02/12/15 0808  BP: 135/73  Pulse: 71  Temp: 98 F (36.7 C)  Resp: 19   Filed Weights   02/12/15 0808  Weight: 133 lb 1.6 oz (60.374 kg)    GENERAL:alert, no distress and comfortable SKIN: skin color, texture, turgor are normal, no rashes or significant lesions EYES: normal, Conjunctiva are pink and non-injected, sclera clear OROPHARYNX:no exudate, no erythema and lips, buccal mucosa, and tongue normal  NECK: supple, thyroid normal size, non-tender, without nodularity LYMPH:  Palpable lymphadenopathy. LUNGS: clear to auscultation and percussion with normal breathing effort  HEART: regular rate & rhythm and no murmurs and no lower extremity edema ABDOMEN:abdomen soft, non-tender and normal bowel sounds Musculoskeletal:no cyanosis of digits and no clubbing  NEURO: alert & oriented x 3 with fluent speech, no focal motor/sensory  deficits  LABORATORY DATA:  I have reviewed the data as listed    Component Value Date/Time   NA 142 02/11/2015 0834   NA 142 01/16/2015 1130   K 4.1 02/11/2015 0834   K 4.0 01/16/2015 1130   CL 102 01/16/2015 1130   CO2 24 02/11/2015 0834   CO2 29 01/16/2015 1130   GLUCOSE 86 02/11/2015 0834   GLUCOSE 114* 01/16/2015 1130   BUN 19.4 02/11/2015 0834   BUN 19 01/16/2015 1130   CREATININE 0.9 02/11/2015 0834   CREATININE 1.03 01/16/2015 1130   CALCIUM 9.1 02/11/2015 0834   CALCIUM 9.5 01/16/2015 1130   PROT 6.4 02/11/2015 0834   PROT 6.9 01/16/2015 1130   ALBUMIN 3.7 02/11/2015 0834   ALBUMIN 3.7 01/16/2015 1130   AST 20 02/11/2015 0834   AST 25 01/16/2015 1130   ALT 16 02/11/2015 0834   ALT 20 01/16/2015 1130   ALKPHOS 94 02/11/2015 0834   ALKPHOS 93 01/16/2015 1130   BILITOT 0.34 02/11/2015 0834   BILITOT 0.7 01/16/2015 1130   GFRNONAA 53* 01/16/2015 1130   GFRAA 61* 01/16/2015 1130    No results found for: SPEP, UPEP  Lab Results  Component Value Date   WBC 5.7 02/11/2015   NEUTROABS 4.2 02/11/2015   HGB 13.0 02/11/2015   HCT 40.0 02/11/2015   MCV 86.8 02/11/2015   PLT 178 02/11/2015      Chemistry      Component Value Date/Time   NA 142 02/11/2015 0834   NA 142 01/16/2015 1130   K 4.1 02/11/2015 0834   K 4.0 01/16/2015 1130   CL 102 01/16/2015 1130   CO2 24 02/11/2015 0834   CO2 29 01/16/2015 1130   BUN 19.4 02/11/2015 0834   BUN 19 01/16/2015 1130   CREATININE 0.9 02/11/2015 0834   CREATININE 1.03 01/16/2015 1130      Component Value Date/Time   CALCIUM 9.1 02/11/2015 0834   CALCIUM 9.5 01/16/2015 1130   ALKPHOS 94 02/11/2015 0834   ALKPHOS 93 01/16/2015 1130   AST 20 02/11/2015 0834   AST 25 01/16/2015 1130   ALT 16 02/11/2015 0834   ALT 20 01/16/2015 1130   BILITOT 0.34 02/11/2015 0834   BILITOT 0.7 01/16/2015 1130       RADIOGRAPHIC STUDIES: I have personally reviewed the radiological images as listed and agreed with the findings  in the report. Nm Pet Image Initial (pi) Skull Base To Thigh  02/11/2015   CLINICAL DATA:  Initial treatment strategy for non-Hodgkin's lymphoma. Follicular lymphoma.  EXAM: NUCLEAR MEDICINE PET SKULL BASE TO THIGH  TECHNIQUE: 6.7 mCi F-18 FDG was injected intravenously. Full-ring PET imaging was performed from the skull base to thigh after the radiotracer. CT data was obtained and used for attenuation correction and anatomic localization.  FASTING BLOOD GLUCOSE:  Value: 92 mg/dl  COMPARISON:  None.  FINDINGS: NECK  Bilateral enlarged and hypermetabolic level II and level II lymph nodes. For example cluster of left level 2 and level 3 lymph nodes on image 35 with SUV max 5. Hypermetabolic level 1 nodes are also present.  CHEST  Bilateral intensely hypermetabolic bulky axillary lymphadenopathy. For example left axillary lymph nodes with SUV max equal 9.2. These nodes measure up to 15  mm short axis.  Mildly enlarged and hypermetabolic mediastinal and hilar lymph nodes.  Within the left lower lobe there are 2 adjacent 3 and 5 mm nodules on image 77 and 82 of series 4. Within the inferior right middle lobe there is a cluster of nodules each measuring 6 mm (image 71, series 4). These have mild associated metabolic activity.  Several small hypermetabolic nodules the lateral left and right breast. For example 8 mm nodule inferior right breast on image 82, series 4.  ABDOMEN/PELVIS  Spleen is increased in metabolic activity compared to the liver. Spleen is normal volume with calculated volume of 310 cubic cm and measures 11.9 cm in craniocaudad dimension.  There are bulky retroperitoneal periaortic lymph nodes with SUV max equals 7.3. There are hypermetabolic left internal and external iliac lymph nodes. Example right external iliac lymph node with SUV max equal 11.8 is enlarged measuring 22 mm short axis on image 149, series 4. There are bilateral hypermetabolic inguinal lymph nodes additionally.  SKELETON  There is no  abnormal metabolic activity within the marrow.  IMPRESSION: 1. Hypermetabolic lymphadenopathy within the neck, axilla, mediastinum, retroperitoneum, and iliac chains. 2. Highest metabolic activity the axilla, retroperitoneum and on iliac nodal stations. 3. Uniformly increased in metabolic activity within the spleen is concern for lymphoma involvement. The spleen is normal volume. 4. Small bilateral pulmonary nodules. Cluster of nodules in the right middle lobe have mild metabolic activity. Findings concerning for lymphoma involvement of the lung parenchyma. 5. Metabolic nodules within the lateral breast tissue are felt to represent lymph nodes. 6. No evidence of marrow involvement by FDG PET imaging.   Electronically Signed   By: Suzy Bouchard M.D.   On: 02/11/2015 14:44     ASSESSMENT & PLAN:  Follicular lymphoma grade 3a We discussed the role of chemotherapy. The intent is for cure.  We discussed some of the risks, benefits and side-effects of Rituximab,Cytoxan, Adriamycin,Vincristine and Solumedrol/Prednisone.   Some of the short term side-effects included, though not limited to, risk of fatigue, weight loss, tumor lysis syndrome, risk of allergic reactions, pancytopenia, life-threatening infections, need for transfusions of blood products, nausea, vomiting, change in bowel habits, hair loss, risk of congestive heart failure, admission to hospital for various reasons, and risks of death.   Long term side-effects are also discussed including permanent damage to nerve function, chronic fatigue, and rare secondary malignancy including bone marrow disorders.   The patient is aware that the response rates discussed earlier is not guaranteed.    After a long discussion, patient made an informed decision to proceed with the prescribed plan of care and went ahead to sign the consent form today.   Patient education material was dispensed The patient is also interested to participate in research  trial. Please see separate documentation pertaining to this. Due to significant high burden of disease, I am concerned about high risks of infusion reaction and tumor lysis syndrome. I'll proceed to start her on 60 mg of prednisone daily for 10 days and for her to start on allopurinol now. I will see her back on May 11 for toxicity review and repeat blood work.      Orders Placed This Encounter  Procedures  . CBC with Differential    Standing Status: Standing     Number of Occurrences: 20     Standing Expiration Date: 02/13/2016  . Comprehensive metabolic panel    Standing Status: Standing     Number of Occurrences: 20  Standing Expiration Date: 02/13/2016  . Uric Acid    Standing Status: Future     Number of Occurrences:      Standing Expiration Date: 03/18/2016  . Lactate dehydrogenase    Standing Status: Future     Number of Occurrences:      Standing Expiration Date: 03/18/2016  . PHYSICIAN COMMUNICATION ORDER    A baseline Echo/ Muga should be obtained prior to initiation of Anthracycline Chemotherapy  . PHYSICIAN COMMUNICATION ORDER    Hepatitis B Virus screening with HBsAg and anti-HBc recommended prior to treatment with rituximab (Rituxan), ofatumumab (Arzerra) or obinutuzumab Dyann Kief).   All questions were answered. The patient knows to call the clinic with any problems, questions or concerns. No barriers to learning was detected. I spent 30 minutes counseling the patient face to face. The total time spent in the appointment was 40 minutes and more than 50% was on counseling and review of test results     [facility], [place], MD 02/12/2015 10:55 AM

## 2015-02-12 NOTE — Assessment & Plan Note (Signed)
We discussed the role of chemotherapy. The intent is for cure.  We discussed some of the risks, benefits and side-effects of Rituximab,Cytoxan, Adriamycin,Vincristine and Solumedrol/Prednisone.   Some of the short term side-effects included, though not limited to, risk of fatigue, weight loss, tumor lysis syndrome, risk of allergic reactions, pancytopenia, life-threatening infections, need for transfusions of blood products, nausea, vomiting, change in bowel habits, hair loss, risk of congestive heart failure, admission to hospital for various reasons, and risks of death.   Long term side-effects are also discussed including permanent damage to nerve function, chronic fatigue, and rare secondary malignancy including bone marrow disorders.   The patient is aware that the response rates discussed earlier is not guaranteed.    After a long discussion, patient made an informed decision to proceed with the prescribed plan of care and went ahead to sign the consent form today.   Patient education material was dispensed The patient is also interested to participate in research trial. Please see separate documentation pertaining to this. Due to significant high burden of disease, I am concerned about high risks of infusion reaction and tumor lysis syndrome. I'll proceed to start her on 60 mg of prednisone daily for 10 days and for her to start on allopurinol now. I will see her back on May 11 for toxicity review and repeat blood work.

## 2015-02-13 ENCOUNTER — Encounter: Payer: Self-pay | Admitting: Hematology and Oncology

## 2015-02-13 ENCOUNTER — Other Ambulatory Visit: Payer: Self-pay | Admitting: *Deleted

## 2015-02-13 ENCOUNTER — Ambulatory Visit (HOSPITAL_COMMUNITY): Payer: Medicare HMO

## 2015-02-13 ENCOUNTER — Ambulatory Visit (HOSPITAL_COMMUNITY): Payer: Medicare HMO | Admitting: Certified Registered Nurse Anesthetist

## 2015-02-13 ENCOUNTER — Encounter (HOSPITAL_COMMUNITY): Payer: Self-pay | Admitting: Certified Registered Nurse Anesthetist

## 2015-02-13 ENCOUNTER — Encounter (HOSPITAL_COMMUNITY)
Admission: RE | Disposition: A | Discharge status: Home / Self Care | Payer: Self-pay | Source: Ambulatory Visit | Attending: Surgery

## 2015-02-13 ENCOUNTER — Ambulatory Visit (HOSPITAL_COMMUNITY)
Admission: RE | Admit: 2015-02-13 | Discharge: 2015-02-13 | Disposition: A | Discharge status: Home / Self Care | Payer: Medicare HMO | Source: Ambulatory Visit | Attending: Surgery | Admitting: Surgery

## 2015-02-13 DIAGNOSIS — E039 Hypothyroidism, unspecified: Secondary | ICD-10-CM | POA: Insufficient documentation

## 2015-02-13 DIAGNOSIS — Z95828 Presence of other vascular implants and grafts: Secondary | ICD-10-CM

## 2015-02-13 DIAGNOSIS — Z79899 Other long term (current) drug therapy: Secondary | ICD-10-CM | POA: Insufficient documentation

## 2015-02-13 DIAGNOSIS — M1990 Unspecified osteoarthritis, unspecified site: Secondary | ICD-10-CM | POA: Diagnosis not present

## 2015-02-13 DIAGNOSIS — C8510 Unspecified B-cell lymphoma, unspecified site: Secondary | ICD-10-CM | POA: Insufficient documentation

## 2015-02-13 DIAGNOSIS — Z7952 Long term (current) use of systemic steroids: Secondary | ICD-10-CM | POA: Insufficient documentation

## 2015-02-13 DIAGNOSIS — I10 Essential (primary) hypertension: Secondary | ICD-10-CM | POA: Insufficient documentation

## 2015-02-13 DIAGNOSIS — Z419 Encounter for procedure for purposes other than remedying health state, unspecified: Secondary | ICD-10-CM

## 2015-02-13 DIAGNOSIS — C8590 Non-Hodgkin lymphoma, unspecified, unspecified site: Secondary | ICD-10-CM | POA: Diagnosis not present

## 2015-02-13 DIAGNOSIS — Z7982 Long term (current) use of aspirin: Secondary | ICD-10-CM | POA: Insufficient documentation

## 2015-02-13 HISTORY — DX: Pneumonia, unspecified organism: J18.9

## 2015-02-13 HISTORY — PX: PORTACATH PLACEMENT: SHX2246

## 2015-02-13 HISTORY — DX: Anemia, unspecified: D64.9

## 2015-02-13 SURGERY — INSERTION, TUNNELED CENTRAL VENOUS DEVICE, WITH PORT
Anesthesia: General | Site: Chest | Laterality: Right

## 2015-02-13 MED ORDER — OXYCODONE-ACETAMINOPHEN 5-325 MG PO TABS: 1.0000 | ORAL_TABLET | ORAL | Status: DC | PRN

## 2015-02-13 MED ORDER — MIDAZOLAM HCL 2 MG/2ML IJ SOLN
INTRAMUSCULAR | Status: AC
  Filled 2015-02-13: qty 2

## 2015-02-13 MED ORDER — LIDOCAINE HCL (CARDIAC) 20 MG/ML IV SOLN
INTRAVENOUS | Status: AC
  Filled 2015-02-13: qty 15

## 2015-02-13 MED ORDER — HEPARIN SOD (PORK) LOCK FLUSH 100 UNIT/ML IV SOLN
INTRAVENOUS | Status: DC | PRN
  Administered 2015-02-13: 500 [IU]

## 2015-02-13 MED ORDER — ONDANSETRON HCL 4 MG/2ML IJ SOLN
INTRAMUSCULAR | Status: AC
  Filled 2015-02-13: qty 4

## 2015-02-13 MED ORDER — HEMOSTATIC AGENTS (NO CHARGE) OPTIME
TOPICAL | Status: DC | PRN
  Administered 2015-02-13: 1 via TOPICAL

## 2015-02-13 MED ORDER — ONDANSETRON HCL 4 MG/2ML IJ SOLN
INTRAMUSCULAR | Status: DC | PRN
  Administered 2015-02-13: 4 mg via INTRAVENOUS

## 2015-02-13 MED ORDER — FENTANYL CITRATE (PF) 100 MCG/2ML IJ SOLN
INTRAMUSCULAR | Status: AC
  Filled 2015-02-13: qty 2

## 2015-02-13 MED ORDER — PROPOFOL 10 MG/ML IV BOLUS
INTRAVENOUS | Status: DC | PRN
  Administered 2015-02-13: 120 mg via INTRAVENOUS

## 2015-02-13 MED ORDER — PROPOFOL 10 MG/ML IV BOLUS
INTRAVENOUS | Status: AC
  Filled 2015-02-13: qty 20

## 2015-02-13 MED ORDER — MIDAZOLAM HCL 5 MG/5ML IJ SOLN
INTRAMUSCULAR | Status: DC | PRN
  Administered 2015-02-13: 1 mg via INTRAVENOUS

## 2015-02-13 MED ORDER — BUPIVACAINE-EPINEPHRINE (PF) 0.25% -1:200000 IJ SOLN
INTRAMUSCULAR | Status: AC
  Filled 2015-02-13: qty 30

## 2015-02-13 MED ORDER — PHENYLEPHRINE 40 MCG/ML (10ML) SYRINGE FOR IV PUSH (FOR BLOOD PRESSURE SUPPORT)
PREFILLED_SYRINGE | INTRAVENOUS | Status: AC
  Filled 2015-02-13: qty 20

## 2015-02-13 MED ORDER — PHENYLEPHRINE HCL 10 MG/ML IJ SOLN
INTRAMUSCULAR | Status: DC | PRN
  Administered 2015-02-13 (×5): 40 ug via INTRAVENOUS

## 2015-02-13 MED ORDER — LIDOCAINE HCL (CARDIAC) 20 MG/ML IV SOLN
INTRAVENOUS | Status: DC | PRN
  Administered 2015-02-13: 25 mg via INTRAVENOUS

## 2015-02-13 MED ORDER — EPHEDRINE SULFATE 50 MG/ML IJ SOLN
INTRAMUSCULAR | Status: AC
  Filled 2015-02-13: qty 2

## 2015-02-13 MED ORDER — ROCURONIUM BROMIDE 50 MG/5ML IV SOLN
INTRAVENOUS | Status: AC
  Filled 2015-02-13: qty 1

## 2015-02-13 MED ORDER — FENTANYL CITRATE (PF) 250 MCG/5ML IJ SOLN
INTRAMUSCULAR | Status: AC
  Filled 2015-02-13: qty 5

## 2015-02-13 MED ORDER — HEPARIN SODIUM (PORCINE) 5000 UNIT/ML IJ SOLN
INTRAMUSCULAR | Status: DC | PRN
  Administered 2015-02-13: 08:00:00

## 2015-02-13 MED ORDER — MIDAZOLAM HCL 2 MG/2ML IJ SOLN: 0.5000 mg | Freq: Once | INTRAMUSCULAR | Status: DC | PRN

## 2015-02-13 MED ORDER — MEPERIDINE HCL 25 MG/ML IJ SOLN: 6.2500 mg | INTRAMUSCULAR | Status: DC | PRN

## 2015-02-13 MED ORDER — PROMETHAZINE HCL 25 MG/ML IJ SOLN: 6.2500 mg | INTRAMUSCULAR | Status: DC | PRN

## 2015-02-13 MED ORDER — HEPARIN SOD (PORK) LOCK FLUSH 100 UNIT/ML IV SOLN
INTRAVENOUS | Status: AC
  Filled 2015-02-13: qty 5

## 2015-02-13 MED ORDER — DEXAMETHASONE SODIUM PHOSPHATE 4 MG/ML IJ SOLN
INTRAMUSCULAR | Status: DC | PRN
  Administered 2015-02-13: 4 mg via INTRAVENOUS

## 2015-02-13 MED ORDER — FENTANYL CITRATE (PF) 100 MCG/2ML IJ SOLN
25.0000 ug | INTRAMUSCULAR | Status: DC | PRN
  Administered 2015-02-13: 25 ug via INTRAVENOUS

## 2015-02-13 MED ORDER — LACTATED RINGERS IV SOLN
INTRAVENOUS | Status: DC | PRN
  Administered 2015-02-13: 07:00:00 via INTRAVENOUS

## 2015-02-13 MED ORDER — SUCCINYLCHOLINE CHLORIDE 20 MG/ML IJ SOLN
INTRAMUSCULAR | Status: AC
  Filled 2015-02-13: qty 1

## 2015-02-13 MED ORDER — FENTANYL CITRATE (PF) 100 MCG/2ML IJ SOLN
INTRAMUSCULAR | Status: DC | PRN
  Administered 2015-02-13: 25 ug via INTRAVENOUS
  Administered 2015-02-13: 50 ug via INTRAVENOUS
  Administered 2015-02-13: 25 ug via INTRAVENOUS

## 2015-02-13 MED ORDER — BUPIVACAINE-EPINEPHRINE 0.25% -1:200000 IJ SOLN
INTRAMUSCULAR | Status: DC | PRN
Start: 2015-02-13 — End: 2015-02-13
  Administered 2015-02-13: 10 mL

## 2015-02-13 SURGICAL SUPPLY — 48 items
BAG DECANTER FOR FLEXI CONT (MISCELLANEOUS) ×2 IMPLANT
BLADE SURG 11 STRL SS (BLADE) ×2 IMPLANT
CHLORAPREP W/TINT 26ML (MISCELLANEOUS) ×2 IMPLANT
COVER MAYO STAND STRL (DRAPES) ×2 IMPLANT
COVER SURGICAL LIGHT HANDLE (MISCELLANEOUS) ×2 IMPLANT
COVER TRANSDUCER ULTRASND GEL (DRAPE) ×1 IMPLANT
CRADLE DONUT ADULT HEAD (MISCELLANEOUS) ×1 IMPLANT
DECANTER SPIKE VIAL GLASS SM (MISCELLANEOUS) ×1 IMPLANT
DRAPE C-ARM 42X72 X-RAY (DRAPES) ×2 IMPLANT
DRAPE LAPAROSCOPIC ABDOMINAL (DRAPES) ×2 IMPLANT
DRAPE UTILITY XL STRL (DRAPES) ×4 IMPLANT
ELECT CAUTERY BLADE 6.4 (BLADE) ×2 IMPLANT
ELECT REM PT RETURN 9FT ADLT (ELECTROSURGICAL) ×2
ELECTRODE REM PT RTRN 9FT ADLT (ELECTROSURGICAL) ×1 IMPLANT
GAUZE SPONGE 4X4 16PLY XRAY LF (GAUZE/BANDAGES/DRESSINGS) ×1 IMPLANT
GEL ULTRASOUND 20GR AQUASONIC (MISCELLANEOUS) IMPLANT
GLOVE BIO SURGEON STRL SZ8 (GLOVE) ×2 IMPLANT
GLOVE BIOGEL PI IND STRL 8 (GLOVE) ×1 IMPLANT
GLOVE BIOGEL PI INDICATOR 8 (GLOVE) ×1
GOWN STRL REUS W/ TWL LRG LVL3 (GOWN DISPOSABLE) ×2 IMPLANT
GOWN STRL REUS W/ TWL XL LVL3 (GOWN DISPOSABLE) ×1 IMPLANT
GOWN STRL REUS W/TWL LRG LVL3 (GOWN DISPOSABLE) ×4
GOWN STRL REUS W/TWL XL LVL3 (GOWN DISPOSABLE) ×2
HEMOSTAT SNOW SURGICEL 2X4 (HEMOSTASIS) ×1 IMPLANT
INTRODUCER COOK 11FR (CATHETERS) IMPLANT
KIT BASIN OR (CUSTOM PROCEDURE TRAY) ×2 IMPLANT
KIT PORT POWER 8FR ISP CVUE (Catheter) ×1 IMPLANT
KIT PORT POWER 9.6FR MRI PREA (Catheter) IMPLANT
KIT ROOM TURNOVER OR (KITS) ×2 IMPLANT
LIQUID BAND (GAUZE/BANDAGES/DRESSINGS) ×2 IMPLANT
NDL HYPO 25GX1X1/2 BEV (NEEDLE) ×1 IMPLANT
NEEDLE HYPO 25GX1X1/2 BEV (NEEDLE) ×2 IMPLANT
NS IRRIG 1000ML POUR BTL (IV SOLUTION) ×2 IMPLANT
PACK SURGICAL SETUP 50X90 (CUSTOM PROCEDURE TRAY) ×2 IMPLANT
PAD ARMBOARD 7.5X6 YLW CONV (MISCELLANEOUS) ×4 IMPLANT
PENCIL BUTTON HOLSTER BLD 10FT (ELECTRODE) ×2 IMPLANT
SET INTRODUCER 12FR PACEMAKER (SHEATH) IMPLANT
SET SHEATH INTRODUCER 10FR (MISCELLANEOUS) IMPLANT
SHEATH COOK PEEL AWAY SET 9F (SHEATH) IMPLANT
SUT MNCRL AB 4-0 PS2 18 (SUTURE) ×2 IMPLANT
SUT PROLENE 2 0 SH 30 (SUTURE) ×4 IMPLANT
SUT VIC AB 3-0 SH 27 (SUTURE) ×2
SUT VIC AB 3-0 SH 27X BRD (SUTURE) ×1 IMPLANT
SYR 20ML ECCENTRIC (SYRINGE) ×4 IMPLANT
SYR 5ML LUER SLIP (SYRINGE) ×2 IMPLANT
SYR CONTROL 10ML LL (SYRINGE) ×2 IMPLANT
TOWEL OR 17X24 6PK STRL BLUE (TOWEL DISPOSABLE) ×2 IMPLANT
TOWEL OR 17X26 10 PK STRL BLUE (TOWEL DISPOSABLE) ×2 IMPLANT

## 2015-02-13 NOTE — Anesthesia Postprocedure Evaluation (Signed)
  Anesthesia Post-op Note  Patient: Carrie [name]  Procedure(s) Performed: Procedure(s): INSERTION PORT-A-CATH WITH ULTRASOUND (Right)  Patient Location: PACU  Anesthesia Type:General  Level of Consciousness: awake, alert , oriented and patient cooperative  Airway and Oxygen Therapy: Patient Spontanous Breathing  Post-op Pain: none  Post-op Assessment: Post-op Vital signs reviewed, Patient's Cardiovascular Status Stable, Respiratory Function Stable, Patent Airway, No signs of Nausea or vomiting and Pain level controlled  Post-op Vital Signs: Reviewed and stable  Last Vitals:  Filed Vitals:   02/13/15 1041  BP: 151/89  Pulse: 82  Temp:   Resp:     Complications: No apparent anesthesia complications

## 2015-02-13 NOTE — Interval H&P Note (Signed)
History and Physical Interval Note:  02/13/2015 7:05 AM  Carrie [name]  has presented today for surgery, with the diagnosis of lymphoma  The various methods of treatment have been discussed with the patient and family. After consideration of risks, benefits and other options for treatment, the patient has consented to  Procedure(s): INSERTION PORT-A-CATH WITH ULTRA SOUND (N/A) as a surgical intervention .  The patient's history has been reviewed, patient examined, no change in status, stable for surgery.  I have reviewed the patient's chart and labs.  Questions were answered to the patient's satisfaction.     Pt has a B cell lymphoma and requires access for chemotherapy.  Risks discussed of bleeding,  Infection,  Collapse of lung,  Chest tube , bleeding around the lung,  Injury to the heart,  Need for  Other surgery ,  Malfunction,  Migration,  Embolization,  Injury to nerves,  Arteries and veins.  Pt understands alternatives to this.  She agrees to proceed.    Azka Steger A.

## 2015-02-13 NOTE — Anesthesia Procedure Notes (Signed)
Procedure Name: LMA Insertion Date/Time: 02/13/2015 7:43 AM Performed by: Merdis Delay Pre-anesthesia Checklist: Patient identified, Emergency Drugs available, Suction available, Patient being monitored and Timeout performed Patient Re-evaluated:Patient Re-evaluated prior to inductionOxygen Delivery Method: Circle system utilized Preoxygenation: Pre-oxygenation with 100% oxygen Intubation Type: IV induction Ventilation: Mask ventilation without difficulty LMA: LMA inserted LMA Size: 4.0 Number of attempts: 1 Placement Confirmation: positive ETCO2,  CO2 detector and breath sounds checked- equal and bilateral Tube secured with: Tape Dental Injury: Teeth and Oropharynx as per pre-operative assessment

## 2015-02-13 NOTE — Progress Notes (Signed)
Called Dr.jackson for sign out

## 2015-02-13 NOTE — Anesthesia Preprocedure Evaluation (Signed)
Anesthesia Evaluation  Patient identified by MRN, date of birth, ID band Patient awake    Reviewed: Allergy & Precautions, NPO status , Patient's Chart, lab work & pertinent test results  History of Anesthesia Complications Negative for: history of anesthetic complications  Airway Mallampati: I  TM Distance: >3 FB Neck ROM: Full    Dental  (+) Chipped, Dental Advisory Given   Pulmonary neg pulmonary ROS,  breath sounds clear to auscultation        Cardiovascular hypertension (never needed meds), Rhythm:Regular Rate:Normal + Systolic murmurs 1/32 ECHO: EF 55-60%, mild MR, trivial AI, mild TR   Neuro/Psych negative neurological ROS     GI/Hepatic negative GI ROS, Neg liver ROS,   Endo/Other  Hypothyroidism   Renal/GU negative Renal ROS     Musculoskeletal  (+) Arthritis -, Osteoarthritis,    Abdominal   Peds  Hematology lymphoma   Anesthesia Other Findings Lymphoma: steroids  Reproductive/Obstetrics                             Anesthesia Physical Anesthesia Plan  ASA: III  Anesthesia Plan: General   Post-op Pain Management:    Induction: Intravenous  Airway Management Planned: Oral ETT  Additional Equipment:   Intra-op Plan:   Post-operative Plan: Extubation in OR  Informed Consent: I have reviewed the patients History and Physical, chart, labs and discussed the procedure including the risks, benefits and alternatives for the proposed anesthesia with the patient or authorized representative who has indicated his/her understanding and acceptance.   Dental advisory given  Plan Discussed with: CRNA and Surgeon  Anesthesia Plan Comments: (Plan routine monitors, GETA)        Anesthesia Quick Evaluation

## 2015-02-13 NOTE — Op Note (Signed)
Carrie [name] Feb 17, 1941 [id number] 02/13/2015   Preoperative diagnosis: PAC needed  Postoperative diagnosis: Same  Procedure: Portacath Placement right IJ with ultrasound and fluoroscopy.   Surgeon:Zabdi Mis A Feliberto Stockley, MD, FACS  Anesthesia: General and 0.25 % bupivicaine with epi  Clinical History and Indications: The patient is getting ready to begin chemotherapy for her cancer. She  needs a Port-A-Cath for venous access.  Description of Procedure: I have seen the patient in the holding area and confirmed the plans for the procedure as noted above. I reviewed the risks and complications again and the patient has no further questions. She wishes to proceed.   The patient was then taken to the operating room. After satisfactory LMA anesthesia had been obtained the upper chest and lower neck were prepped and draped as a sterile field. The timeout was done. Patient placed in Trendelenberg position.   The right internal jugular  vein was entered with U/S guidance  and the guidewire threaded into the superior vena cava right atrial area under fluoroscopic guidance. An incision was then made on the anterior chest wall and a subcutaneous pocket fashioned for the port reservoir.  The port tubing was then brought through a subcutaneous tunnel from the port site to the guidewire site. With the patient in Trendelenberg position,the dilator and peel-away sheath were then advanced over the guidewire while monitoring this with fluoroscopy. The guidewire and dilator were removed and the tubing threaded to approximately 22 cm. The peel-away sheath was then removed. The catheter aspirated and flushed easily. Using fluoroscopy the tip was backed out into the superior vena cava right atrial junction area. It aspirated and flushed easily. The reservoir was attached and the locking mechanism engaged. That aspirated and flushed easily.  The reservoir was secured to the fascia with 1 sutures of 2-0 Prolene. A final  check with fluoroscopy was done to make sure we had no kinks and good positioning of the tip of the catheter. Everything appeared to be okay. The catheter was aspirated, flushed with dilute heparin and then concentrated aqueous heparin. She had some oozing from the tract site .  54minutes of pressure and a small amount of Surgicel snow helped hemostasis.   The incision was then closed with interrupted 3-0 Vicryl, and 4-0 Monocryl subcuticular with Dermabond on the skin.  There were no operative complications. Estimated blood loss was minimal. All counts were correct. The patient tolerated the procedure well.  Turner Daniels, MD, FACS 02/13/2015 8:33 AM

## 2015-02-13 NOTE — H&P (View-Only) (Signed)
H&P   Carrie [name] (MR# [id number])      H&P Info    Author Note Status Last Update User Last Update Date/Time   Erroll Luna, MD Signed Erroll Luna, MD 12/20/2014 10:23 AM    H&P    Expand All Collapse All   Carrie [name] 12/20/2014 9:56 AM Location: [facility] Patient #: [id number] DOB: 20-Jan-1941 Widowed / Language: Carrie [name] / Race: White Female History of Present Illness Carrie Moores A. Orlie Cundari MD; 12/20/2014 10:19 AM) Patient words: reck  GROIN LYMPHADENOPATHY.  The patient is a 74 year old female who presents with lymphadenopathy. The last clinic visit was 2 month(s) ago. No changes in management were made at the last visit. Symptoms include palpable lump and visible lump, while symptoms do not include fever or malaise. The lymphadenopathy is located in the left cervical region, in the left axillary region, in the left inguinal region, in the right axillary region and in the right inguinal region. The nodes are described as firm. Onset was gradual. There is no known event that preceded symptom onset. The patient describes this as mild and growing larger. Associated symptoms include night sweats. Other Problems Carrie [name], CMA; 12/20/2014 9:57 AM) Arthritis Back Pain Heart murmur Hemorrhoids Oophorectomy Bilateral. Thyroid Disease Transfusion history  Past Surgical History Carrie [name], CMA; 12/20/2014 9:57 AM) Appendectomy Foot Surgery Left. Hysterectomy (not due to cancer) - Complete Knee Surgery Bilateral. Nephrectomy Bilateral. Oral Surgery Spinal Surgery Midback  Diagnostic Studies History Carrie [name], CMA; 12/20/2014 9:57 AM) Colonoscopy 1-5 years ago Mammogram within last year Pap Smear 1-5 years ago  Allergies Carrie [name], CMA; 12/20/2014 9:58 AM) No Known Drug Allergies 12/20/2014  Medication History (Carrie [name], CMA; 12/20/2014 9:58 AM) Levothyroxine Sodium (100MCG Tablet, Oral) Active. Triamcinolone Acetonide (0.1% Cream,  External) Active. Aspirin EC (81MG  Tablet DR, Oral) Active. Medications Reconciled  Social History Carrie [name], CMA; 12/20/2014 9:57 AM) Alcohol use Occasional alcohol use. Caffeine use Coffee, Tea. No drug use Tobacco use Never smoker.  Family History Carrie [name], CMA; 12/20/2014 9:57 AM) Family history unknown First Degree Relatives  Pregnancy / Birth History Carrie [name], CMA; 12/20/2014 9:57 AM) Age at menarche 22 years. Age of menopause 64-50 Contraceptive History Oral contraceptives. Gravida 3 Maternal age 57-25 Para 2     Review of Systems (Wall; 12/20/2014 9:57 AM) General Present- Night Sweats. Not Present- Appetite Loss, Chills, Fatigue, Fever, Weight Gain and Weight Loss. HEENT Present- Wears glasses/contact lenses. Not Present- Earache, Hearing Loss, Hoarseness, Nose Bleed, Oral Ulcers, Ringing in the Ears, Seasonal Allergies, Sinus Pain, Sore Throat, Visual Disturbances and Yellow Eyes. Breast Present- Breast Mass. Not Present- Breast Pain, Nipple Discharge and Skin Changes. Female Genitourinary Present- Frequency. Not Present- Nocturia, Painful Urination, Pelvic Pain and Urgency. Musculoskeletal Present- Back Pain and Joint Pain. Not Present- Joint Stiffness, Muscle Pain, Muscle Weakness and Swelling of Extremities. Psychiatric Present- Change in Sleep Pattern. Not Present- Anxiety, Bipolar, Depression, Fearful and Frequent crying. Endocrine Present- Hair Changes and Hot flashes. Not Present- Cold Intolerance, Excessive Hunger, Heat Intolerance and New Diabetes.  Vitals (Carrie [name] CMA; 12/20/2014 9:57 AM) 12/20/2014 9:57 AM Weight: 133 lb Height: 60in Body Surface Area: 1.6 m Body Mass Index: 25.97 kg/m Temp.: 97.40F(Temporal)  Pulse: 76 (Regular)  BP: 118/80 (Sitting, Left Arm, Standard)     Physical Exam (Carrie Kneebone A. Eupha Lobb MD; 12/20/2014 10:21 AM)  General Mental Status-Alert. General Appearance-Consistent with stated  age. Hydration-Well hydrated. Voice-Normal.  Head and Neck Head-normocephalic, atraumatic with no lesions  or palpable masses. Trachea-midline. Thyroid Gland Characteristics - normal size and consistency.  Eye Eyeball - Bilateral-Extraocular movements intact. Sclera/Conjunctiva - Bilateral-No scleral icterus.  Chest and Lung Exam Chest and lung exam reveals -quiet, even and easy respiratory effort with no use of accessory muscles and on auscultation, normal breath sounds, no adventitious sounds and normal vocal resonance. Inspection Chest Wall - Normal. Back - normal.  Abdomen Inspection Inspection of the abdomen reveals - No Hernias. Skin - Scar - no surgical scars. Palpation/Percussion Palpation and Percussion of the abdomen reveal - Soft, Non Tender, No Rebound tenderness, No Rigidity (guarding) and No hepatosplenomegaly. Auscultation Auscultation of the abdomen reveals - Bowel sounds normal.  Musculoskeletal Normal Exam - Left-Upper Extremity Strength Normal and Lower Extremity Strength Normal. Normal Exam - Right-Upper Extremity Strength Normal and Lower Extremity Strength Normal.  Lymphatic Head & Neck  Postauricular Nodes: Bilateral - Size - 2.0 cm. Consistency - Firm. Mobility - Mobile. Axillary  General Axillary Region: Left - Description - Generalized lymphadenopathy. Size - 2.0 cm. Right - Description - Generalized lymphadenopathy. Size - 2.0 cm. Femoral & Inguinal  Generalized Femoral & Inguinal Lymphatics: Left - Description - Generalized lymphadenopathy. Size - > 3.0 cm. Consistency - Firm. Right - Description - Generalized lymphadenopathy. Size - > 3.0 cm. Consistency - Firm.    Assessment & Plan (Carrie Kolander A. Skii Cleland MD; 12/20/2014 10:22 AM)  LYMPHADENOPATHY, GENERALIZED (785.6  R59.1) Impression: RECOMMEND ln BIOPSY TO EVALUATE FOR LYMPHOMA. RISK OF BLEEDING, INFECTION, LYMPH LEAK, NERVE INJURY, BLOOD VESSEL INJURY AND THE NEED FOR MORE  SURGERY SHE AGREES  Current Plans Pt Education - Lymph Nodes, Enlarged: enlarged gland

## 2015-02-13 NOTE — Transfer of Care (Signed)
Immediate Anesthesia Transfer of Care Note  Patient: Carrie [name]  Procedure(s) Performed: Procedure(s): INSERTION PORT-A-CATH WITH ULTRASOUND (Right)  Patient Location: PACU  Anesthesia Type:General  Level of Consciousness: awake, alert  and oriented  Airway & Oxygen Therapy: Patient Spontanous Breathing and Patient connected to nasal cannula oxygen  Post-op Assessment: Report given to RN and Post -op Vital signs reviewed and stable  Post vital signs: Reviewed and stable  Last Vitals:  Filed Vitals:   02/13/15 0707  BP: 163/103  Pulse: 78  Temp: 36.4 C  Resp: 18    Complications: No apparent anesthesia complications

## 2015-02-13 NOTE — Progress Notes (Signed)
I called patient back and spoke with daughter genell. The patient is in surgery but she gave me info to call [employer] about zofran. I called and did the precert and had to send office notes. The daughter said was told she could be reimbursed for what she paid? Holland Falling said the patient would have to call about that.

## 2015-02-13 NOTE — Discharge Instructions (Signed)

## 2015-02-14 ENCOUNTER — Other Ambulatory Visit (HOSPITAL_BASED_OUTPATIENT_CLINIC_OR_DEPARTMENT_OTHER): Payer: Medicare Other

## 2015-02-14 ENCOUNTER — Encounter: Payer: Self-pay | Admitting: *Deleted

## 2015-02-14 ENCOUNTER — Encounter: Payer: Medicare HMO | Admitting: *Deleted

## 2015-02-14 ENCOUNTER — Encounter (HOSPITAL_COMMUNITY): Payer: Self-pay | Admitting: Surgery

## 2015-02-14 ENCOUNTER — Ambulatory Visit (HOSPITAL_COMMUNITY): Admission: RE | Admit: 2015-02-14 | Payer: Medicare HMO | Source: Ambulatory Visit

## 2015-02-14 ENCOUNTER — Inpatient Hospital Stay (HOSPITAL_COMMUNITY): Admission: RE | Admit: 2015-02-14 | Payer: Medicare HMO | Source: Ambulatory Visit

## 2015-02-14 ENCOUNTER — Encounter: Payer: Self-pay | Admitting: Hematology and Oncology

## 2015-02-14 DIAGNOSIS — Z006 Encounter for examination for normal comparison and control in clinical research program: Secondary | ICD-10-CM

## 2015-02-14 DIAGNOSIS — C8230 Follicular lymphoma grade IIIa, unspecified site: Secondary | ICD-10-CM

## 2015-02-14 DIAGNOSIS — C8590 Non-Hodgkin lymphoma, unspecified, unspecified site: Secondary | ICD-10-CM

## 2015-02-14 LAB — CBC WITH DIFFERENTIAL/PLATELET
BASO%: 0.5 % (ref 0.0–2.0)
Basophils Absolute: 0.1 10*3/uL (ref 0.0–0.1)
EOS%: 1.5 % (ref 0.0–7.0)
Eosinophils Absolute: 0.2 10*3/uL (ref 0.0–0.5)
HCT: 39.3 % (ref 34.8–46.6)
HGB: 12.5 g/dL (ref 11.6–15.9)
LYMPH%: 11.9 % — ABNORMAL LOW (ref 14.0–49.7)
MCH: 28.8 pg (ref 25.1–34.0)
MCHC: 31.8 g/dL (ref 31.5–36.0)
MCV: 90.6 fL (ref 79.5–101.0)
MONO#: 0.6 10*3/uL (ref 0.1–0.9)
MONO%: 5.7 % (ref 0.0–14.0)
NEUT#: 8.7 10*3/uL — ABNORMAL HIGH (ref 1.5–6.5)
NEUT%: 80.4 % — ABNORMAL HIGH (ref 38.4–76.8)
Platelets: 204 10*3/uL (ref 145–400)
RBC: 4.34 10*6/uL (ref 3.70–5.45)
RDW: 14.3 % (ref 11.2–14.5)
WBC: 10.8 10*3/uL — ABNORMAL HIGH (ref 3.9–10.3)
lymph#: 1.3 10*3/uL (ref 0.9–3.3)

## 2015-02-14 LAB — COMPREHENSIVE METABOLIC PANEL (CC13)
ALT: 20 U/L (ref 0–55)
AST: 21 U/L (ref 5–34)
Albumin: 3.8 g/dL (ref 3.5–5.0)
Alkaline Phosphatase: 88 U/L (ref 40–150)
Anion Gap: 9 mEq/L (ref 3–11)
BUN: 23.4 mg/dL (ref 7.0–26.0)
CO2: 27 mEq/L (ref 22–29)
Calcium: 8.7 mg/dL (ref 8.4–10.4)
Chloride: 105 mEq/L (ref 98–109)
Creatinine: 1 mg/dL (ref 0.6–1.1)
EGFR: 56 mL/min/{1.73_m2} — ABNORMAL LOW (ref >90)
Glucose: 111 mg/dl (ref 70–140)
Potassium: 3.8 mEq/L (ref 3.5–5.1)
Sodium: 141 mEq/L (ref 136–145)
Total Bilirubin: 0.5 mg/dL (ref 0.20–1.20)
Total Protein: 6.6 g/dL (ref 6.4–8.3)

## 2015-02-14 LAB — RESEARCH LABS

## 2015-02-14 MED ORDER — INV-ATORVASTATIN/PLACEBO 40 MG TABS WAKE FOREST WF 98213: 1.0000 | ORAL_TABLET | Freq: Every day | ORAL | Status: DC

## 2015-02-14 NOTE — Progress Notes (Signed)
PREVENT - [id number] - patient came to the cancer center.  Baseline neurocognitive testing was administered, and the patient completed her questionnaires.  I checked the questionnaires for completeness.  Waist measurement taken by [name], RN.  Research labs were drawn.  Thanked the patient for participating in the clinical trial. Barb Yatzari Jonsson 02/14/2015.11:32AM

## 2015-02-14 NOTE — Progress Notes (Signed)
02/14/2015 1100 PREVENT study Patient came to [facility] for research labs and nurse visit. TAS and waist measurement was obtained by this RN. Questionnaires were administered to patient per B. Puhala, CRA. Patient was given study drug after it was checked and logged in by pharmacy.  The patient understands to begin study drug on 02/15/15 and will take the pill at the same time every day.  The patient was given medication calendars and instructed to mark off the days after it is taken and to write down if a dose is missed.  The patient verbalized understanding and was without any questions regarding study drug.  She had her study labs drawn today and will then go to Radiology for Cardiac MRI.  This RN thanked the patient for her time and commitment to the study.  She understands her current schedule and will begin chemotherapy on 02/17/15.  She had no questions after today's visit. [name], RN Clinical Research Nurse

## 2015-02-14 NOTE — Progress Notes (Signed)
Have communicated with Dr. Clelia Croft, PI of the PREVENT Study, re: the potential of not having a baseline cardiac MRI for the study. Due to unexpected staffing issues in MRI, the cardiac MRI was cancelled today.  Have worked closely with Thressa Sheller, Director of Radiology, to try to reschedule.  Patient is not available on Sunday or Monday morning before the start of chemotherapy, and Saturday was not an option for radiology.  Per Dr. Danny Lawless, since the patient has had a baseline echocardiogram, we will not re-schedule the baseline cardiac MRI.  Patient will remain in the study as planned, and begin study drug tomorrow, based upon earlier direction with [name], Research Nurse.   Patient will have her first cardiac MRI at 6 months from the start of study drug.  Called Carrie [name] to explain the final decision and assured her that she will continue in the study without any issues related to not having the baseline cardiac MRI.

## 2015-02-14 NOTE — Progress Notes (Signed)
02/14/2015  1320 PREVENT Received phone message from the patient stating that she was called and told that her MRI could not be done today and that it would need to be re-scheduled.  This RN spoke with the manager, Joylene Draft, who stated the MRI had to be cancelled due to staff calling out.  The research department was never notified of the cancellation.  This RN called patient back and apologized for the inconvenience.  This patient stated that was fine.  This RN informed the patient that she would be notified if any instructions regarding her study treatment should change.  This RN notified the Geophysicist/field seismologist of above. 02/14/2015 1530 This RN notified the PI and study coordinators at [facility] re: above.  Per Dr. Danny Lawless, patient is to start study medicine as planned.  He said if the patient cannot get MRI, she can still be in the study and have the MRI at 6 months.   This RN asked the patient if she could come in over the weekend or first thing Monday to do the MRI.  The patient stated Sat. would be the only day she could do it.  This RN notified Kenton Kingfisher, RN re: above.  The patient knows that she may receive a call if they can do the MRI on Sat.  The patient also understands that if the MRI cannot be done, there will not be a baseline MRI at 6 months to compare to.  She verbalized understanding and stated she would still want to be in the study. This RN again apologized to the patient and thanked the patient for her patience. [name], RN

## 2015-02-17 ENCOUNTER — Other Ambulatory Visit (HOSPITAL_COMMUNITY)
Admission: AD | Admit: 2015-02-17 | Discharge: 2015-02-17 | Disposition: A | Discharge status: Home / Self Care | Payer: Medicare HMO | Source: Ambulatory Visit | Attending: Hematology and Oncology | Admitting: Hematology and Oncology

## 2015-02-17 ENCOUNTER — Other Ambulatory Visit: Payer: Self-pay | Admitting: Hematology and Oncology

## 2015-02-17 ENCOUNTER — Other Ambulatory Visit: Payer: Medicare HMO

## 2015-02-17 ENCOUNTER — Ambulatory Visit (HOSPITAL_BASED_OUTPATIENT_CLINIC_OR_DEPARTMENT_OTHER): Payer: Medicare HMO

## 2015-02-17 ENCOUNTER — Telehealth: Payer: Self-pay | Admitting: Hematology and Oncology

## 2015-02-17 ENCOUNTER — Encounter: Payer: Self-pay | Admitting: *Deleted

## 2015-02-17 ENCOUNTER — Other Ambulatory Visit: Payer: Self-pay | Admitting: Nurse Practitioner

## 2015-02-17 VITALS — BP 144/79 | HR 84 | Temp 98.3°F | Resp 20

## 2015-02-17 DIAGNOSIS — Z5112 Encounter for antineoplastic immunotherapy: Secondary | ICD-10-CM

## 2015-02-17 DIAGNOSIS — C8230 Follicular lymphoma grade IIIa, unspecified site: Secondary | ICD-10-CM | POA: Diagnosis present

## 2015-02-17 LAB — TROPONIN I: Troponin I: 0.05 ng/mL — ABNORMAL HIGH (ref <0.031)

## 2015-02-17 MED ORDER — DIPHENHYDRAMINE HCL 25 MG PO CAPS
50.0000 mg | ORAL_CAPSULE | Freq: Once | ORAL | Status: AC
  Administered 2015-02-17: 50 mg via ORAL

## 2015-02-17 MED ORDER — HEPARIN SOD (PORK) LOCK FLUSH 100 UNIT/ML IV SOLN
500.0000 [IU] | Freq: Once | INTRAVENOUS | Status: AC | PRN
  Administered 2015-02-17: 500 [IU]
  Filled 2015-02-17: qty 5

## 2015-02-17 MED ORDER — METHYLPREDNISOLONE SODIUM SUCC 125 MG IJ SOLR
125.0000 mg | Freq: Once | INTRAMUSCULAR | Status: AC | PRN
  Administered 2015-02-17: 125 mg via INTRAVENOUS

## 2015-02-17 MED ORDER — DEXAMETHASONE SODIUM PHOSPHATE 100 MG/10ML IJ SOLN
Freq: Once | INTRAMUSCULAR | Status: AC
  Administered 2015-02-17: 09:00:00 via INTRAVENOUS
  Filled 2015-02-17: qty 8

## 2015-02-17 MED ORDER — ACETAMINOPHEN 325 MG PO TABS
650.0000 mg | ORAL_TABLET | Freq: Once | ORAL | Status: AC
  Administered 2015-02-17: 650 mg via ORAL

## 2015-02-17 MED ORDER — SODIUM CHLORIDE 0.9 % IV SOLN
375.0000 mg/m2 | Freq: Once | INTRAVENOUS | Status: AC
  Administered 2015-02-17: 600 mg via INTRAVENOUS
  Filled 2015-02-17: qty 60

## 2015-02-17 MED ORDER — FAMOTIDINE IN NACL 20-0.9 MG/50ML-% IV SOLN
20.0000 mg | Freq: Once | INTRAVENOUS | Status: AC | PRN
  Administered 2015-02-17: 20 mg via INTRAVENOUS

## 2015-02-17 MED ORDER — DIPHENHYDRAMINE HCL 25 MG PO CAPS
ORAL_CAPSULE | ORAL | Status: AC
  Filled 2015-02-17: qty 2

## 2015-02-17 MED ORDER — SODIUM CHLORIDE 0.9 % IV SOLN
Freq: Once | INTRAVENOUS | Status: AC
  Administered 2015-02-17: 09:00:00 via INTRAVENOUS

## 2015-02-17 MED ORDER — SODIUM CHLORIDE 0.9 % IV SOLN
2.0000 mg | Freq: Once | INTRAVENOUS | Status: AC
  Administered 2015-02-17: 2 mg via INTRAVENOUS
  Filled 2015-02-17: qty 2

## 2015-02-17 MED ORDER — SODIUM CHLORIDE 0.9 % IJ SOLN
10.0000 mL | INTRAMUSCULAR | Status: DC | PRN
  Administered 2015-02-17: 10 mL
  Filled 2015-02-17: qty 10

## 2015-02-17 MED ORDER — ACETAMINOPHEN 325 MG PO TABS
ORAL_TABLET | ORAL | Status: AC
  Filled 2015-02-17: qty 2

## 2015-02-17 MED ORDER — SODIUM CHLORIDE 0.9 % IV SOLN
750.0000 mg/m2 | Freq: Once | INTRAVENOUS | Status: AC
  Administered 2015-02-17: 1200 mg via INTRAVENOUS
  Filled 2015-02-17: qty 60

## 2015-02-17 MED ORDER — SODIUM CHLORIDE 0.9 % IV SOLN
Freq: Once | INTRAVENOUS | Status: AC
  Administered 2015-02-17: 12:00:00 via INTRAVENOUS

## 2015-02-17 MED ORDER — DOXORUBICIN HCL CHEMO IV INJECTION 2 MG/ML
50.0000 mg/m2 | Freq: Once | INTRAVENOUS | Status: AC
  Administered 2015-02-17: 80 mg via INTRAVENOUS
  Filled 2015-02-17: qty 40

## 2015-02-17 NOTE — Patient Instructions (Signed)
[facility] Discharge Instructions for Patients Receiving Chemotherapy  Today you received the following chemotherapy agents adriamycin, vincristine, cytoxan and rituxan.  To help prevent nausea and vomiting after your treatment, we encourage you to take your nausea medication zofran and compazine. Take zofran tonight before bed and then a dose in the morning.  Take these two doses even if you are not nauseated.  After this use the zofran and compazine as directed.   If you develop nausea and vomiting that is not controlled by your nausea medication, call the clinic.   BELOW ARE SYMPTOMS THAT SHOULD BE REPORTED IMMEDIATELY:  *FEVER GREATER THAN 100.5 F  *CHILLS WITH OR WITHOUT FEVER  NAUSEA AND VOMITING THAT IS NOT CONTROLLED WITH YOUR NAUSEA MEDICATION  *UNUSUAL SHORTNESS OF BREATH  *UNUSUAL BRUISING OR BLEEDING  TENDERNESS IN MOUTH AND THROAT WITH OR WITHOUT PRESENCE OF ULCERS  *URINARY PROBLEMS  *BOWEL PROBLEMS  UNUSUAL RASH Items with * indicate a potential emergency and should be followed up as soon as possible.  Feel free to call the clinic you have any questions or concerns. The clinic phone number is [phone].  Please show the [place] [facility] at check-in to the Emergency Department and triage nurse.

## 2015-02-17 NOTE — Telephone Encounter (Signed)
Added lab/fu for 5/3. Patient given appointment while in inf.

## 2015-02-17 NOTE — Progress Notes (Signed)
1315 Rituxan stopped due to patient having chest pain.  Carrie [name] here.  Solumdedrol 125 and Pepcid 20 mg given.   Chest pain resolving. 1400 Chest pain completely resolved.  OK to resume Rituxan at previous rate before reaction.  Rate of 77 ml/hr.. Patient up to bathroom with no difficulty.  Rituxan restarted at 1410. Patient still has O2 sats in low 90's.  They improve when she works at taking a deep breath.   Discussed with Carrie [name].  Will lower start rate to 9ml/hr and give her O2 at 2L/min. [place] Dr. Alvy Bimler in to see patient and gave order to increase rate to 67ml/hr.

## 2015-02-17 NOTE — Progress Notes (Signed)
02/17/2015 0830 PREVENT study of Atorvastatin vs. Placebo Was given message by Edmon Crape, research assistant that patient had a critical value message called from Santiago Glad at [place].  The message was patient # [id number] M, L had a critical Troponin I value of 0.11 (H) in her baseline research lab work taken on 02/14/15.  This RN notified  Dr. Alvy Bimler of the critical Troponin I lab value done at [facility] on 02/14/15 and that the result had been called to research office this morning.  The patient has not been treated with chemotherapy yet.   Per Dr. Alvy Bimler, "I will order stat Troponin I now".  Per Dr. Alvy Bimler, "This should not prevent the patient from receiving treatment".  This RN spoke with Royce Macadamia, treating RN, regarding above and that a stat Troponin I level had been ordered by Dr. Alvy Bimler based upon the critical value message left by [facility].  She will follow up on the results. This RN Therapist, sports, V. Sheidler regarding above.  This RN notified Dr. Danny Lawless, [place], regarding above.  Per Dr. Danny Lawless, "This should not prevent the patient from going on-study or from being treated". This RN met with the patient and confirmed she started taking her study pills on 02/15/15 at noon and again on 02/16/15 at noon.  The patient denied complaints and confirmed no side effects from study drug.  The patient denied any questions regarding the PREVENT study. [name], RN Clinical Research Nurse

## 2015-02-17 NOTE — Progress Notes (Signed)
1312 pt c/o chest discomfort. Stopped Rituxan.  NS continues. VSS

## 2015-02-18 ENCOUNTER — Telehealth: Payer: Self-pay | Admitting: Hematology and Oncology

## 2015-02-18 ENCOUNTER — Encounter: Payer: Self-pay | Admitting: Hematology and Oncology

## 2015-02-18 ENCOUNTER — Telehealth: Payer: Self-pay | Admitting: *Deleted

## 2015-02-18 ENCOUNTER — Ambulatory Visit (HOSPITAL_BASED_OUTPATIENT_CLINIC_OR_DEPARTMENT_OTHER): Payer: Medicare HMO | Admitting: Hematology and Oncology

## 2015-02-18 ENCOUNTER — Ambulatory Visit (HOSPITAL_BASED_OUTPATIENT_CLINIC_OR_DEPARTMENT_OTHER): Payer: Medicare HMO

## 2015-02-18 ENCOUNTER — Other Ambulatory Visit (HOSPITAL_BASED_OUTPATIENT_CLINIC_OR_DEPARTMENT_OTHER): Payer: Medicare HMO

## 2015-02-18 ENCOUNTER — Other Ambulatory Visit (HOSPITAL_COMMUNITY)
Admission: AD | Admit: 2015-02-18 | Discharge: 2015-02-18 | Disposition: A | Discharge status: Home / Self Care | Payer: Medicare HMO | Source: Ambulatory Visit | Attending: Hematology and Oncology | Admitting: Hematology and Oncology

## 2015-02-18 VITALS — BP 151/74 | HR 71 | Temp 98.2°F | Resp 16 | Ht 61.0 in | Wt 135.2 lb

## 2015-02-18 DIAGNOSIS — C8230 Follicular lymphoma grade IIIa, unspecified site: Secondary | ICD-10-CM

## 2015-02-18 DIAGNOSIS — R778 Other specified abnormalities of plasma proteins: Secondary | ICD-10-CM

## 2015-02-18 DIAGNOSIS — R7989 Other specified abnormal findings of blood chemistry: Secondary | ICD-10-CM

## 2015-02-18 DIAGNOSIS — Z5189 Encounter for other specified aftercare: Secondary | ICD-10-CM | POA: Diagnosis not present

## 2015-02-18 HISTORY — DX: Other specified abnormalities of plasma proteins: R77.8

## 2015-02-18 HISTORY — DX: Other specified abnormal findings of blood chemistry: R79.89

## 2015-02-18 LAB — CBC WITH DIFFERENTIAL/PLATELET
BASO%: 0.1 % (ref 0.0–2.0)
Basophils Absolute: 0 10*3/uL (ref 0.0–0.1)
EOS%: 0.4 % (ref 0.0–7.0)
Eosinophils Absolute: 0.1 10*3/uL (ref 0.0–0.5)
HCT: 37.5 % (ref 34.8–46.6)
HGB: 12.3 g/dL (ref 11.6–15.9)
LYMPH%: 7.8 % — ABNORMAL LOW (ref 14.0–49.7)
MCH: 28.9 pg (ref 25.1–34.0)
MCHC: 32.8 g/dL (ref 31.5–36.0)
MCV: 88 fL (ref 79.5–101.0)
MONO#: 0.7 10*3/uL (ref 0.1–0.9)
MONO%: 5.4 % (ref 0.0–14.0)
NEUT#: 10.4 10*3/uL — ABNORMAL HIGH (ref 1.5–6.5)
NEUT%: 86.3 % — ABNORMAL HIGH (ref 38.4–76.8)
Platelets: 219 10*3/uL (ref 145–400)
RBC: 4.26 10*6/uL (ref 3.70–5.45)
RDW: 14 % (ref 11.2–14.5)
WBC: 12 10*3/uL — ABNORMAL HIGH (ref 3.9–10.3)
lymph#: 0.9 10*3/uL (ref 0.9–3.3)

## 2015-02-18 LAB — COMPREHENSIVE METABOLIC PANEL (CC13)
ALT: 24 U/L (ref 0–55)
AST: 25 U/L (ref 5–34)
Albumin: 3.6 g/dL (ref 3.5–5.0)
Alkaline Phosphatase: 85 U/L (ref 40–150)
Anion Gap: 9 mEq/L (ref 3–11)
BUN: 28.7 mg/dL — ABNORMAL HIGH (ref 7.0–26.0)
CO2: 25 mEq/L (ref 22–29)
Calcium: 8.4 mg/dL (ref 8.4–10.4)
Chloride: 105 mEq/L (ref 98–109)
Creatinine: 0.9 mg/dL (ref 0.6–1.1)
EGFR: 67 mL/min/{1.73_m2} — ABNORMAL LOW (ref >90)
Glucose: 98 mg/dl (ref 70–140)
Potassium: 3.7 mEq/L (ref 3.5–5.1)
Sodium: 139 mEq/L (ref 136–145)
Total Bilirubin: 0.62 mg/dL (ref 0.20–1.20)
Total Protein: 6.4 g/dL (ref 6.4–8.3)

## 2015-02-18 LAB — TROPONIN I: Troponin I: 0.04 ng/mL — ABNORMAL HIGH (ref <0.031)

## 2015-02-18 MED ORDER — PEGFILGRASTIM INJECTION 6 MG/0.6ML ~~LOC~~
6.0000 mg | PREFILLED_SYRINGE | Freq: Once | SUBCUTANEOUS | Status: AC
  Administered 2015-02-18: 6 mg via SUBCUTANEOUS
  Filled 2015-02-18: qty 0.6

## 2015-02-18 NOTE — Telephone Encounter (Signed)
-----   Message from Ignacia Felling, RN sent at 02/17/2015  8:53 AM EDT ----- Regarding: Chemo follow up call  dr. Evonnie Pat 1st [facility]   Dr. Alvy Bimler  Pt. Phone [phone]

## 2015-02-18 NOTE — Assessment & Plan Note (Signed)
She tolerated chemotherapy well. A few of her lymph nodes had disappeared. I will continue to see on a weekly basis for cycle 1. Due to infusion reaction with cycle 1, I recommend we extend the duration of chemotherapy for 8 hours minimum. I plan to see her the week before chemotherapy with blood work, history and physical examination.

## 2015-02-18 NOTE — Progress Notes (Signed)
I spoke with Leann at [facility] and she will call me back with prior auth to let [facility] know. [employer] doesn't do it.

## 2015-02-18 NOTE — Patient Instructions (Signed)
Pegfilgrastim injection What is this medicine? PEGFILGRASTIM (peg fil GRA stim) is a long-acting granulocyte colony-stimulating factor that stimulates the growth of neutrophils, a type of white blood cell important in the body's fight against infection. It is used to reduce the incidence of fever and infection in patients with certain types of cancer who are receiving chemotherapy that affects the bone marrow. This medicine may be used for other purposes; ask your health care provider or pharmacist if you have questions. COMMON BRAND NAME(S): Neulasta What should I tell my health care provider before I take this medicine? They need to know if you have any of these conditions: -latex allergy -ongoing radiation therapy -sickle cell disease -skin reactions to acrylic adhesives (On-Body Injector only) -an unusual or allergic reaction to pegfilgrastim, filgrastim, other medicines, foods, dyes, or preservatives -pregnant or trying to get pregnant -breast-feeding How should I use this medicine? This medicine is for injection under the skin. If you get this medicine at home, you will be taught how to prepare and give the pre-filled syringe or how to use the On-body Injector. Refer to the patient Instructions for Use for detailed instructions. Use exactly as directed. Take your medicine at regular intervals. Do not take your medicine more often than directed. It is important that you put your used needles and syringes in a special sharps container. Do not put them in a trash can. If you do not have a sharps container, call your pharmacist or healthcare provider to get one. Talk to your pediatrician regarding the use of this medicine in children. Special care may be needed. Overdosage: If you think you have taken too much of this medicine contact a poison control center or emergency room at once. NOTE: This medicine is only for you. Do not share this medicine with others. What if I miss a dose? It is  important not to miss your dose. Call your doctor or health care professional if you miss your dose. If you miss a dose due to an On-body Injector failure or leakage, a new dose should be administered as soon as possible using a single prefilled syringe for manual use. What may interact with this medicine? Interactions have not been studied. Give your health care provider a list of all the medicines, herbs, non-prescription drugs, or dietary supplements you use. Also tell them if you smoke, drink alcohol, or use illegal drugs. Some items may interact with your medicine. This list may not describe all possible interactions. Give your health care provider a list of all the medicines, herbs, non-prescription drugs, or dietary supplements you use. Also tell them if you smoke, drink alcohol, or use illegal drugs. Some items may interact with your medicine. What should I watch for while using this medicine? You may need blood work done while you are taking this medicine. If you are going to need a MRI, CT scan, or other procedure, tell your doctor that you are using this medicine (On-Body Injector only). What side effects may I notice from receiving this medicine? Side effects that you should report to your doctor or health care professional as soon as possible: -allergic reactions like skin rash, itching or hives, swelling of the face, lips, or tongue -dizziness -fever -pain, redness, or irritation at site where injected -pinpoint red spots on the skin -shortness of breath or breathing problems -stomach or side pain, or pain at the shoulder -swelling -tiredness -trouble passing urine Side effects that usually do not require medical attention (report to your doctor   or health care professional if they continue or are bothersome): -bone pain -muscle pain This list may not describe all possible side effects. Call your doctor for medical advice about side effects. You may report side effects to FDA at  1-800-FDA-1088. Where should I keep my medicine? Keep out of the reach of children. Store pre-filled syringes in a refrigerator between 2 and 8 degrees C (36 and 46 degrees F). Do not freeze. Keep in carton to protect from light. Throw away this medicine if it is left out of the refrigerator for more than 48 hours. Throw away any unused medicine after the expiration date. NOTE: This sheet is a summary. It may not cover all possible information. If you have questions about this medicine, talk to your doctor, pharmacist, or health care provider.  2015, Elsevier/Gold Standard. (2014-01-03 16:14:05)  

## 2015-02-18 NOTE — Assessment & Plan Note (Signed)
She is not symptomatic. Her recent echocardiogram was negative. Just to be sure, I recommend she sees her cardiologist for further assessment and she agreed to proceed.

## 2015-02-18 NOTE — Telephone Encounter (Signed)
Pt confirmed labs/ov per 05/03 POF, gave pt AVS and Calendar.Cherylann Banas, sent msg to add chemo

## 2015-02-18 NOTE — Progress Notes (Signed)
[name] OFF[facility] PROGRESS NOTE  Patient Care Team: Arvella Nigh, MD as PCP - General (Obstetrics and Gynecology)  SUMMARY OF ONCOLOGIC HISTORY:   Follicular lymphoma grade 3a   01/21/2015 Surgery She underwent excisional lymph node biopsy that came back follicular lymphoma grade 3   01/21/2015 Pathology Results Accession: [id number] biopsy confirmed follicular lymphoma   0/32/1224 Imaging Echocardiogram showed ejection fraction of 55-60%   02/11/2015 Imaging  PET CT scan show possible splenic involvement and diffuse lymphadenopathy throughout   02/11/2015 Bone Marrow Biopsy  bone marrow biopsy was performed and is involved by lymphoma   02/13/2015 Procedure She had port placement.   02/17/2015 -  Chemotherapy She received R-CHOP chemo   02/17/2015 Adverse Reaction She had mild infusion reaction with cycle 1 of treatment.     INTERVAL HISTORY: Please see below for problem oriented charting. She is in today to review recent test results and for further follow-up for elevated troponin. She was found to have elevated troponin at 0.11 last week as part of a research labs. Yesterday, it was still elevated but improved compared to last week. She denies any chest pain or shortness of breath. No palpitation. She had mild infusion reaction yesterday but all her symptoms has resolved.  REVIEW OF SYSTEMS:   Constitutional: Denies fevers, chills or abnormal weight loss Eyes: Denies blurriness of vision Ears, nose, mouth, throat, and face: Denies mucositis or sore throat Respiratory: Denies cough, dyspnea or wheezes Cardiovascular: Denies palpitation, chest discomfort or lower extremity swelling Gastrointestinal:  Denies nausea, heartburn or change in bowel habits Skin: Denies abnormal skin rashes Lymphatics: Denies new lymphadenopathy or easy bruising Neurological:Denies numbness, tingling or new weaknesses Behavioral/Psych: Mood is stable, no new changes  All other systems were reviewed  with the patient and are negative.  I have reviewed the past medical history, past surgical history, social history and family history with the patient and they are unchanged from previous note.  ALLERGIES:  has No Known Allergies.  MEDICATIONS:  Current Outpatient Prescriptions  Medication Sig Dispense Refill  . acetaminophen (TYLENOL) 325 MG tablet Take 650 mg by mouth every 6 (six) hours as needed.    Marland Kitchen allopurinol (ZYLOPRIM) 300 MG tablet Take 1 tablet (300 mg total) by mouth daily. 30 tablet 0  . aspirin 81 MG tablet Take 81 mg by mouth daily.    . Atorvastatin Calcium (INVESTIGATIONAL ATORVASTATIN/PLACEBO) 40 MG tablet [facility] [phone] Take 1 tablet by mouth daily. Take 2 doses (these doses must be 12 hours apart) prior to first chemotherapy treatment. Then take 1 tablet daily by mouth with or without food. 180 tablet 0  . levothyroxine (SYNTHROID, LEVOTHROID) 75 MCG tablet Take 100 mcg by mouth daily.     Marland Kitchen lidocaine-prilocaine (EMLA) cream Apply 1 application topically as needed. Apply to port a cath site one hour prior to needle stick 30 g 2  . Multiple Vitamins-Minerals (AIRBORNE GUMMIES PO) Take by mouth.    . ondansetron (ZOFRAN) 8 MG tablet Take 1 tablet (8 mg total) by mouth 2 (two) times daily. 30 tablet 1  . oxyCODONE-acetaminophen (ROXICET) 5-325 MG per tablet Take 1 tablet by mouth every 4 (four) hours as needed. 30 tablet 0  . Oxycodone-Acetaminophen ER 7.5-325 MG TBCR Take by mouth.    . predniSONE (DELTASONE) 20 MG tablet Take 3 tablets (60 mg total) by mouth daily. 30 tablet 1  . prochlorperazine (COMPAZINE) 10 MG tablet Take 1 tablet (10 mg total) by mouth every  6 (six) hours as needed (Nausea or vomiting). 30 tablet 6  . triamcinolone ointment (KENALOG) 0.1 % Apply 1 application topically 2 (two) times daily as needed (Itching).     No current facility-administered medications for this visit.    PHYSICAL EXAMINATION: ECOG PERFORMANCE STATUS: 0 -  Asymptomatic  Filed Vitals:   02/18/15 1320  BP: 151/74  Pulse: 71  Temp: 98.2 F (36.8 C)  Resp: 16   Filed Weights   02/18/15 1320  Weight: 135 lb 3.2 oz (61.326 kg)    GENERAL:alert, no distress and comfortable SKIN: skin color, texture, turgor are normal, no rashes or significant lesions EYES: normal, Conjunctiva are pink and non-injected, sclera clear OROPHARYNX:no exudate, no erythema and lips, buccal mucosa, and tongue normal  NECK: supple, thyroid normal size, non-tender, without nodularity LYMPH:  She has palpable lymphadenopathy throughout.  LUNGS: clear to auscultation and percussion with normal breathing effort HEART: regular rate & rhythm and no murmurs and no lower extremity edema ABDOMEN:abdomen soft, non-tender and normal bowel sounds Musculoskeletal:no cyanosis of digits and no clubbing  NEURO: alert & oriented x 3 with fluent speech, no focal motor/sensory deficits  LABORATORY DATA:  I have reviewed the data as listed    Component Value Date/Time   NA 139 02/18/2015 1251   NA 142 01/16/2015 1130   K 3.7 02/18/2015 1251   K 4.0 01/16/2015 1130   CL 102 01/16/2015 1130   CO2 25 02/18/2015 1251   CO2 29 01/16/2015 1130   GLUCOSE 98 02/18/2015 1251   GLUCOSE 114* 01/16/2015 1130   BUN 28.7* 02/18/2015 1251   BUN 19 01/16/2015 1130   CREATININE 0.9 02/18/2015 1251   CREATININE 1.03 01/16/2015 1130   CALCIUM 8.4 02/18/2015 1251   CALCIUM 9.5 01/16/2015 1130   PROT 6.4 02/18/2015 1251   PROT 6.9 01/16/2015 1130   ALBUMIN 3.6 02/18/2015 1251   ALBUMIN 3.7 01/16/2015 1130   AST 25 02/18/2015 1251   AST 25 01/16/2015 1130   ALT 24 02/18/2015 1251   ALT 20 01/16/2015 1130   ALKPHOS 85 02/18/2015 1251   ALKPHOS 93 01/16/2015 1130   BILITOT 0.62 02/18/2015 1251   BILITOT 0.7 01/16/2015 1130   GFRNONAA 53* 01/16/2015 1130   GFRAA 61* 01/16/2015 1130    No results found for: SPEP, UPEP  Lab Results  Component Value Date   WBC 12.0* 02/18/2015    NEUTROABS 10.4* 02/18/2015   HGB 12.3 02/18/2015   HCT 37.5 02/18/2015   MCV 88.0 02/18/2015   PLT 219 02/18/2015      Chemistry      Component Value Date/Time   NA 139 02/18/2015 1251   NA 142 01/16/2015 1130   K 3.7 02/18/2015 1251   K 4.0 01/16/2015 1130   CL 102 01/16/2015 1130   CO2 25 02/18/2015 1251   CO2 29 01/16/2015 1130   BUN 28.7* 02/18/2015 1251   BUN 19 01/16/2015 1130   CREATININE 0.9 02/18/2015 1251   CREATININE 1.03 01/16/2015 1130      Component Value Date/Time   CALCIUM 8.4 02/18/2015 1251   CALCIUM 9.5 01/16/2015 1130   ALKPHOS 85 02/18/2015 1251   ALKPHOS 93 01/16/2015 1130   AST 25 02/18/2015 1251   AST 25 01/16/2015 1130   ALT 24 02/18/2015 1251   ALT 20 01/16/2015 1130   BILITOT 0.62 02/18/2015 1251   BILITOT 0.7 01/16/2015 1130       RADIOGRAPHIC STUDIES: I reviewed the imaging study and results of  bone marrow biopsy with her and family I have personally reviewed the radiological images as listed and agreed with the findings in the report.  ASSESSMENT & PLAN:  Follicular lymphoma grade 3a She tolerated chemotherapy well. A few of her lymph nodes had disappeared. I will continue to see on a weekly basis for cycle 1. Due to infusion reaction with cycle 1, I recommend we extend the duration of chemotherapy for 8 hours minimum. I plan to see her the week before chemotherapy with blood work, history and physical examination.   Elevated troponin She is not symptomatic. Her recent echocardiogram was negative. Just to be sure, I recommend she sees her cardiologist for further assessment and she agreed to proceed.   I recommend she resume aspirin therapy.  All questions were answered. The patient knows to call the clinic with any problems, questions or concerns. No barriers to learning was detected. I spent 25 minutes counseling the patient face to face. The total time spent in the appointment was 30 minutes and more than 50% was on counseling  and review of test results     [facility], [place], MD 02/18/2015 3:34 PM

## 2015-02-18 NOTE — Telephone Encounter (Signed)
Chemo follow up call. LM to call back if any problems or concerns

## 2015-02-19 LAB — TISSUE HYBRIDIZATION (BONE MARROW)-NCBH

## 2015-02-19 LAB — CHROMOSOME ANALYSIS, BONE MARROW

## 2015-02-20 NOTE — Telephone Encounter (Signed)
Mailed schedule to pt with updated chemo added and sent msg through my chart

## 2015-02-26 ENCOUNTER — Ambulatory Visit (HOSPITAL_BASED_OUTPATIENT_CLINIC_OR_DEPARTMENT_OTHER): Payer: Medicare HMO | Admitting: Hematology and Oncology

## 2015-02-26 ENCOUNTER — Other Ambulatory Visit (HOSPITAL_BASED_OUTPATIENT_CLINIC_OR_DEPARTMENT_OTHER): Payer: Medicare HMO

## 2015-02-26 ENCOUNTER — Encounter: Payer: Self-pay | Admitting: Medical Oncology

## 2015-02-26 VITALS — BP 138/74 | HR 95 | Temp 97.4°F | Resp 18 | Ht 61.0 in | Wt 132.7 lb

## 2015-02-26 DIAGNOSIS — C8230 Follicular lymphoma grade IIIa, unspecified site: Secondary | ICD-10-CM

## 2015-02-26 DIAGNOSIS — R7989 Other specified abnormal findings of blood chemistry: Secondary | ICD-10-CM | POA: Diagnosis not present

## 2015-02-26 DIAGNOSIS — K1231 Oral mucositis (ulcerative) due to antineoplastic therapy: Secondary | ICD-10-CM

## 2015-02-26 DIAGNOSIS — R778 Other specified abnormalities of plasma proteins: Secondary | ICD-10-CM

## 2015-02-26 LAB — CBC WITH DIFFERENTIAL/PLATELET
BASO%: 0.3 % (ref 0.0–2.0)
Basophils Absolute: 0 10*3/uL (ref 0.0–0.1)
EOS%: 1 % (ref 0.0–7.0)
Eosinophils Absolute: 0.1 10*3/uL (ref 0.0–0.5)
HCT: 40.9 % (ref 34.8–46.6)
HGB: 13.4 g/dL (ref 11.6–15.9)
LYMPH%: 9.7 % — ABNORMAL LOW (ref 14.0–49.7)
MCH: 28.4 pg (ref 25.1–34.0)
MCHC: 32.8 g/dL (ref 31.5–36.0)
MCV: 86.6 fL (ref 79.5–101.0)
MONO#: 0.6 10*3/uL (ref 0.1–0.9)
MONO%: 5.8 % (ref 0.0–14.0)
NEUT#: 8.2 10*3/uL — ABNORMAL HIGH (ref 1.5–6.5)
NEUT%: 83.2 % — ABNORMAL HIGH (ref 38.4–76.8)
Platelets: 245 10*3/uL (ref 145–400)
RBC: 4.73 10*6/uL (ref 3.70–5.45)
RDW: 14.1 % (ref 11.2–14.5)
WBC: 9.9 10*3/uL (ref 3.9–10.3)
lymph#: 1 10*3/uL (ref 0.9–3.3)

## 2015-02-26 LAB — COMPREHENSIVE METABOLIC PANEL (CC13)
ALT: 15 U/L (ref 0–55)
AST: 17 U/L (ref 5–34)
Albumin: 3.9 g/dL (ref 3.5–5.0)
Alkaline Phosphatase: 128 U/L (ref 40–150)
Anion Gap: 11 mEq/L (ref 3–11)
BUN: 24.5 mg/dL (ref 7.0–26.0)
CO2: 29 mEq/L (ref 22–29)
Calcium: 9.2 mg/dL (ref 8.4–10.4)
Chloride: 99 mEq/L (ref 98–109)
Creatinine: 1.1 mg/dL (ref 0.6–1.1)
EGFR: 50 mL/min/{1.73_m2} — ABNORMAL LOW (ref >90)
Glucose: 137 mg/dl (ref 70–140)
Potassium: 4.4 mEq/L (ref 3.5–5.1)
Sodium: 138 mEq/L (ref 136–145)
Total Bilirubin: 0.42 mg/dL (ref 0.20–1.20)
Total Protein: 6.4 g/dL (ref 6.4–8.3)

## 2015-02-26 LAB — URIC ACID (CC13): Uric Acid, Serum: 3.6 mg/dl (ref 2.6–7.4)

## 2015-02-26 LAB — LACTATE DEHYDROGENASE (CC13): LDH: 344 U/L — ABNORMAL HIGH (ref 125–245)

## 2015-02-26 NOTE — Progress Notes (Signed)
PREVENT study: Met with patient and family this morning, for [name] RN who is out of the office, during her scheduled appointment with Dr. Alvy Bimler. I introduced myself to patient and informed her that Barnett Applebaum was out of the office and asked me to follow up with her today. I inquired with patient as to whether she was having any side effects, issues or concerns. Patient with no complaints and reports no side effects, states she is doing well and denies questions at this time. Patient did report that the study provided drug/placebo was big and at times she has some difficulty swallowing it. She does report to be taking the study drug/placebo daily as instructed. Inquired with patient if she has an appointment or has seen a cardiologist to follow up with her troponin levels. Patient states she does not have an appointment with cardiologist, Dr. Burt Knack at [facility] is who she mentioned, and was waiting to hear from Dr. Alvy Bimler about this appointment. States she did not call Dr. Antionette Char office to schedule this and said that "Dr. Alvy Bimler sent him a message" regarding her to be seen with him. I informed patient will give Barnett Applebaum this update. I thanked patient for her time and encouraged her to contact clinic with any questions or concerns. Adele Dan, RN, Clinical Research 02/26/2015 3:49 PM

## 2015-02-27 DIAGNOSIS — K1231 Oral mucositis (ulcerative) due to antineoplastic therapy: Secondary | ICD-10-CM | POA: Insufficient documentation

## 2015-02-27 NOTE — Assessment & Plan Note (Signed)
This was an incidental finding. I have consult with her cardiologist who felt that observation would be appropriate.

## 2015-02-27 NOTE — Progress Notes (Signed)
[facility] OFFICE PROGRESS NOTE  Patient Care Team: Arvella Nigh, MD as PCP - General (Obstetrics and Gynecology) Carola Frost, RN as Registered Nurse (Medical Oncology)  SUMMARY OF ONCOLOGIC HISTORY:   Follicular lymphoma grade 3a   01/21/2015 Surgery She underwent excisional lymph node biopsy that came back follicular lymphoma grade 3   01/21/2015 Pathology Results Accession: [id number] biopsy confirmed follicular lymphoma   0/04/1218 Imaging Echocardiogram showed ejection fraction of 55-60%   02/11/2015 Imaging  PET CT scan show possible splenic involvement and diffuse lymphadenopathy throughout   02/11/2015 Bone Marrow Biopsy  bone marrow biopsy was performed and is involved by lymphoma   02/13/2015 Procedure She had port placement.   02/17/2015 -  Chemotherapy She received R-CHOP chemo   02/17/2015 Adverse Reaction She had mild infusion reaction with cycle 1 of treatment.    INTERVAL HISTORY: Please see below for problem oriented charting. She is in today as part of supportive care visit. She denies new lymphadenopathy. She had very minor mild source in her lips and her gum but it does not interfere with her ability to eat. Overall, she feels well She denies chest pain, shortness of breath or leg swelling.  REVIEW OF SYSTEMS:   Constitutional: Denies fevers, chills or abnormal weight loss Eyes: Denies blurriness of vision Ears, nose, mouth, throat, and face: Denies mucositis or sore throat Respiratory: Denies cough, dyspnea or wheezes Cardiovascular: Denies palpitation, chest discomfort or lower extremity swelling Gastrointestinal:  Denies nausea, heartburn or change in bowel habits Skin: Denies abnormal skin rashes Lymphatics: Denies new lymphadenopathy or easy bruising Neurological:Denies numbness, tingling or new weaknesses Behavioral/Psych: Mood is stable, no new changes  All other systems were reviewed with the patient and are negative.  I have reviewed the past  medical history, past surgical history, social history and family history with the patient and they are unchanged from previous note.  ALLERGIES:  has No Known Allergies.  MEDICATIONS:  Current Outpatient Prescriptions  Medication Sig Dispense Refill  . acetaminophen (TYLENOL) 325 MG tablet Take 650 mg by mouth every 6 (six) hours as needed.    Marland Kitchen allopurinol (ZYLOPRIM) 300 MG tablet Take 1 tablet (300 mg total) by mouth daily. 30 tablet 0  . aspirin 81 MG tablet Take 81 mg by mouth daily.    . Atorvastatin Calcium (INVESTIGATIONAL ATORVASTATIN/PLACEBO) 40 MG tablet [facility] [phone] Take 1 tablet by mouth daily. Take 2 doses (these doses must be 12 hours apart) prior to first chemotherapy treatment. Then take 1 tablet daily by mouth with or without food. 180 tablet 0  . levothyroxine (SYNTHROID, LEVOTHROID) 75 MCG tablet Take 100 mcg by mouth daily.     Marland Kitchen lidocaine-prilocaine (EMLA) cream Apply 1 application topically as needed. Apply to port a cath site one hour prior to needle stick 30 g 2  . loratadine (CLARITIN) 10 MG tablet Take 10 mg by mouth daily.    . Multiple Vitamins-Minerals (AIRBORNE GUMMIES PO) Take by mouth.    . ondansetron (ZOFRAN) 8 MG tablet Take 1 tablet (8 mg total) by mouth 2 (two) times daily. 30 tablet 1  . oxyCODONE-acetaminophen (ROXICET) 5-325 MG per tablet Take 1 tablet by mouth every 4 (four) hours as needed. 30 tablet 0  . Oxycodone-Acetaminophen ER 7.5-325 MG TBCR Take by mouth.    . predniSONE (DELTASONE) 20 MG tablet Take 3 tablets (60 mg total) by mouth daily. 30 tablet 1  . prochlorperazine (COMPAZINE) 10 MG tablet Take 1 tablet (  10 mg total) by mouth every 6 (six) hours as needed (Nausea or vomiting). 30 tablet 6  . triamcinolone ointment (KENALOG) 0.1 % Apply 1 application topically 2 (two) times daily as needed (Itching).     No current facility-administered medications for this visit.    PHYSICAL EXAMINATION: ECOG PERFORMANCE STATUS: 0 -  Asymptomatic  Filed Vitals:   02/26/15 1000  BP: 138/74  Pulse: 95  Temp: 97.4 F (36.3 C)  Resp: 18   Filed Weights   02/26/15 1000  Weight: 132 lb 11.2 oz (60.192 kg)    GENERAL:alert, no distress and comfortable SKIN: skin color, texture, turgor are normal, no rashes or significant lesions EYES: normal, Conjunctiva are pink and non-injected, sclera clear OROPHARYNX: She had very mild mucositis and blister in her lower lip NECK: supple, thyroid normal size, non-tender, without nodularity LYMPH: Most of her lymph nodes have disappeared. Majority of the lymph nodes have regressed in size LUNGS: clear to auscultation and percussion with normal breathing effort HEART: regular rate & rhythm and no murmurs and no lower extremity edema ABDOMEN:abdomen soft, non-tender and normal bowel sounds Musculoskeletal:no cyanosis of digits and no clubbing  NEURO: alert & oriented x 3 with fluent speech, no focal motor/sensory deficits  LABORATORY DATA:  I have reviewed the data as listed    Component Value Date/Time   NA 138 02/26/2015 0945   NA 142 01/16/2015 1130   K 4.4 02/26/2015 0945   K 4.0 01/16/2015 1130   CL 102 01/16/2015 1130   CO2 29 02/26/2015 0945   CO2 29 01/16/2015 1130   GLUCOSE 137 02/26/2015 0945   GLUCOSE 114* 01/16/2015 1130   BUN 24.5 02/26/2015 0945   BUN 19 01/16/2015 1130   CREATININE 1.1 02/26/2015 0945   CREATININE 1.03 01/16/2015 1130   CALCIUM 9.2 02/26/2015 0945   CALCIUM 9.5 01/16/2015 1130   PROT 6.4 02/26/2015 0945   PROT 6.9 01/16/2015 1130   ALBUMIN 3.9 02/26/2015 0945   ALBUMIN 3.7 01/16/2015 1130   AST 17 02/26/2015 0945   AST 25 01/16/2015 1130   ALT 15 02/26/2015 0945   ALT 20 01/16/2015 1130   ALKPHOS 128 02/26/2015 0945   ALKPHOS 93 01/16/2015 1130   BILITOT 0.42 02/26/2015 0945   BILITOT 0.7 01/16/2015 1130   GFRNONAA 53* 01/16/2015 1130   GFRAA 61* 01/16/2015 1130    No results found for: SPEP, UPEP  Lab Results  Component  Value Date   WBC 9.9 02/26/2015   NEUTROABS 8.2* 02/26/2015   HGB 13.4 02/26/2015   HCT 40.9 02/26/2015   MCV 86.6 02/26/2015   PLT 245 02/26/2015      Chemistry      Component Value Date/Time   NA 138 02/26/2015 0945   NA 142 01/16/2015 1130   K 4.4 02/26/2015 0945   K 4.0 01/16/2015 1130   CL 102 01/16/2015 1130   CO2 29 02/26/2015 0945   CO2 29 01/16/2015 1130   BUN 24.5 02/26/2015 0945   BUN 19 01/16/2015 1130   CREATININE 1.1 02/26/2015 0945   CREATININE 1.03 01/16/2015 1130      Component Value Date/Time   CALCIUM 9.2 02/26/2015 0945   CALCIUM 9.5 01/16/2015 1130   ALKPHOS 128 02/26/2015 0945   ALKPHOS 93 01/16/2015 1130   AST 17 02/26/2015 0945   AST 25 01/16/2015 1130   ALT 15 02/26/2015 0945   ALT 20 01/16/2015 1130   BILITOT 0.42 02/26/2015 0945   BILITOT 0.7 01/16/2015 1130  ASSESSMENT & PLAN:  Follicular lymphoma grade 3a She tolerated chemotherapy well. A few of her lymph nodes had disappeared. I will continue to see on a weekly basis for cycle 1. Due to infusion reaction with cycle 1, I recommend we extend the duration of chemotherapy for 8 hours minimum. I plan to see her the week before chemotherapy with blood work, history and physical examination. Due to mucositis, I plan to reduce the dose of Cytoxan and Adriamycin by 10% in the future    Elevated troponin This was an incidental finding. I have consult with her cardiologist who felt that observation would be appropriate.   Mucositis due to chemotherapy She has some very minor mucositis. I recommend conservative management with baking soda mixed and salt gargle. I will reevaluate next week. I plan to reduce the dose of future chemotherapy by 10% for Cytoxan and Adriamycin.    No orders of the defined types were placed in this encounter.   All questions were answered. The patient knows to call the clinic with any problems, questions or concerns. No barriers to learning was  detected. I spent 15 minutes counseling the patient face to face. The total time spent in the appointment was 20 minutes and more than 50% was on counseling and review of test results     [facility], [place], MD 02/27/2015 1:48 PM

## 2015-02-27 NOTE — Assessment & Plan Note (Signed)
She tolerated chemotherapy well. A few of her lymph nodes had disappeared. I will continue to see on a weekly basis for cycle 1. Due to infusion reaction with cycle 1, I recommend we extend the duration of chemotherapy for 8 hours minimum. I plan to see her the week before chemotherapy with blood work, history and physical examination. Due to mucositis, I plan to reduce the dose of Cytoxan and Adriamycin by 10% in the future

## 2015-02-27 NOTE — Assessment & Plan Note (Signed)
She has some very minor mucositis. I recommend conservative management with baking soda mixed and salt gargle. I will reevaluate next week. I plan to reduce the dose of future chemotherapy by 10% for Cytoxan and Adriamycin.

## 2015-03-05 ENCOUNTER — Telehealth: Payer: Self-pay | Admitting: *Deleted

## 2015-03-05 NOTE — Telephone Encounter (Signed)
Thanks for the info OK to stop study drug

## 2015-03-05 NOTE — Telephone Encounter (Signed)
03/05/2015 1155  PREVENT study Dr. Alvy Bimler was informed of patient's earlier phone call and complaints.  Per Dr. Alvy Bimler, patient is to stop study drug.  Patient verbalized understanding and will see MD on 03/07/15.  Patient understands to call or seek medical attention if any chest or abdominal pain or other symptoms occur. [name], RN Clinical Research

## 2015-03-05 NOTE — Telephone Encounter (Signed)
03/05/2015 1052  PREVENT study Received phone call from patient stating that she has not been taking her pills since 03/02/15 due to having side effects that she thinks are related to the study drug.  She stated that she has been experiencing indigestion, chest and abdominal pain, and has had difficulty swallowing the study drug.  She stated that the episodes occurred approximately 1 to 1.5 hours after taking the medication with food and lasted for 30-45 minutes.  She stated she does not need anymore stress in her life and that she wants to discuss this with the MD on 03/07/15 at her appointment.  She stated that she stopped the drug on 03/02/15 and has not had any chest or abdominal pain, including indigestion, since 03/02/15.  She stated that she feels the above mentioned side effects also were similar to what she experienced during her "chemo reaction" on 02/17/15.  The patient stated she had these symptoms every day from 02/23/15 to 03/02/15 after taking the study drug.  She stated the reason she contacted this RN today was because she wanted to inform me she had not taken the study drug for 3 days and that she had not had any symptoms in 3 days. She confirmed that she felt good today and was out driving and running errands,.  She stated she will not take any more study drug until after her meeting with Dr. Alvy Bimler on 03/07/15.  This RN thanked the patient for the call, reminded patient to seek medical attention immediately or call 911 for any cardiac symptoms, including chest pain, chest pressure, SOB, etc.  The patient verbalized understanding and stated "I have been much better since I stopped taking the study pill".  Dr. Alvy Bimler will be informed of above. [name], RN Clinical Research

## 2015-03-07 ENCOUNTER — Other Ambulatory Visit (HOSPITAL_BASED_OUTPATIENT_CLINIC_OR_DEPARTMENT_OTHER): Payer: Medicare HMO

## 2015-03-07 ENCOUNTER — Encounter: Payer: Self-pay | Admitting: *Deleted

## 2015-03-07 ENCOUNTER — Ambulatory Visit (HOSPITAL_BASED_OUTPATIENT_CLINIC_OR_DEPARTMENT_OTHER): Payer: Medicare HMO | Admitting: Hematology and Oncology

## 2015-03-07 ENCOUNTER — Telehealth: Payer: Self-pay | Admitting: Hematology and Oncology

## 2015-03-07 VITALS — BP 120/81 | HR 90 | Temp 97.7°F | Resp 16 | Ht 61.0 in | Wt 132.9 lb

## 2015-03-07 DIAGNOSIS — Z006 Encounter for examination for normal comparison and control in clinical research program: Secondary | ICD-10-CM

## 2015-03-07 DIAGNOSIS — C8230 Follicular lymphoma grade IIIa, unspecified site: Secondary | ICD-10-CM

## 2015-03-07 DIAGNOSIS — R072 Precordial pain: Secondary | ICD-10-CM

## 2015-03-07 DIAGNOSIS — K1231 Oral mucositis (ulcerative) due to antineoplastic therapy: Secondary | ICD-10-CM | POA: Diagnosis not present

## 2015-03-07 DIAGNOSIS — R778 Other specified abnormalities of plasma proteins: Secondary | ICD-10-CM

## 2015-03-07 DIAGNOSIS — R5383 Other fatigue: Secondary | ICD-10-CM

## 2015-03-07 DIAGNOSIS — R7989 Other specified abnormal findings of blood chemistry: Secondary | ICD-10-CM

## 2015-03-07 LAB — COMPREHENSIVE METABOLIC PANEL (CC13)
ALT: 19 U/L (ref 0–55)
AST: 22 U/L (ref 5–34)
Albumin: 3.7 g/dL (ref 3.5–5.0)
Alkaline Phosphatase: 97 U/L (ref 40–150)
Anion Gap: 13 mEq/L — ABNORMAL HIGH (ref 3–11)
BUN: 21.4 mg/dL (ref 7.0–26.0)
CO2: 24 mEq/L (ref 22–29)
Calcium: 8.9 mg/dL (ref 8.4–10.4)
Chloride: 104 mEq/L (ref 98–109)
Creatinine: 0.9 mg/dL (ref 0.6–1.1)
EGFR: 64 mL/min/{1.73_m2} — ABNORMAL LOW (ref >90)
Glucose: 124 mg/dl (ref 70–140)
Potassium: 4.2 mEq/L (ref 3.5–5.1)
Sodium: 141 mEq/L (ref 136–145)
Total Bilirubin: 0.57 mg/dL (ref 0.20–1.20)
Total Protein: 6.6 g/dL (ref 6.4–8.3)

## 2015-03-07 LAB — CBC WITH DIFFERENTIAL/PLATELET
BASO%: 0.7 % (ref 0.0–2.0)
Basophils Absolute: 0.1 10*3/uL (ref 0.0–0.1)
EOS%: 0.9 % (ref 0.0–7.0)
Eosinophils Absolute: 0.1 10*3/uL (ref 0.0–0.5)
HCT: 38.4 % (ref 34.8–46.6)
HGB: 12.6 g/dL (ref 11.6–15.9)
LYMPH%: 5.5 % — ABNORMAL LOW (ref 14.0–49.7)
MCH: 29 pg (ref 25.1–34.0)
MCHC: 32.8 g/dL (ref 31.5–36.0)
MCV: 88.5 fL (ref 79.5–101.0)
MONO#: 0.7 10*3/uL (ref 0.1–0.9)
MONO%: 7.1 % (ref 0.0–14.0)
NEUT#: 8.7 10*3/uL — ABNORMAL HIGH (ref 1.5–6.5)
NEUT%: 85.8 % — ABNORMAL HIGH (ref 38.4–76.8)
Platelets: 283 10*3/uL (ref 145–400)
RBC: 4.34 10*6/uL (ref 3.70–5.45)
RDW: 14.8 % — ABNORMAL HIGH (ref 11.2–14.5)
WBC: 10.1 10*3/uL (ref 3.9–10.3)
lymph#: 0.6 10*3/uL — ABNORMAL LOW (ref 0.9–3.3)

## 2015-03-07 NOTE — Telephone Encounter (Signed)
No pof sent for pt today. Patient already on schedule for 5/23 and 6/13. Gave patient avs report for today's visit.

## 2015-03-07 NOTE — Assessment & Plan Note (Signed)
This was an incidental finding. I have consult with her cardiologist who felt that observation would be appropriate.  clinically, she has no signs of congestive heart failure.

## 2015-03-07 NOTE — Assessment & Plan Note (Signed)
She has some very minor mucositis, improving. I recommend conservative management with baking soda mixed and salt gargle. I plan to reduce the dose of future chemotherapy by 10% for Cytoxan and Adriamycin.

## 2015-03-07 NOTE — Assessment & Plan Note (Signed)
She has mild fatigue which is not an expected side effects from treatment. I signed her application for temporarily disability parking which I think is appropriate.

## 2015-03-07 NOTE — Assessment & Plan Note (Signed)
She tolerated chemotherapy well. All the lymph nodes had disappeared. Due to infusion reaction with cycle 1, I recommend we extend the duration of chemotherapy for 8 hours minimum  For cycle 2. If she tolerated cycle 2 well, we can direct infusion for cycle 3. I plan to see her  In 3 weeks before chemotherapy with blood work, history and physical examination. Due to mucositis, I plan to reduce the dose of Cytoxan and Adriamycin by 10% in the future

## 2015-03-07 NOTE — Progress Notes (Signed)
03/07/2015 1315  PREVENT Met with the patient today during visit with Dr. Gorsuch.  The patient stopped study drug on 03/02/15 due to chest and abdominal pain, indigestion.  She stated it went away when she stopped the drug and that she has not experienced further chest, abdominal pain or indigestion. The patient and Dr. Gorsuch have agreed that the patient will stop study drug.  The patient agreed to stay on the study for follow-up and future labs, MRI, questionnaires, etc.  This RN thanked the patient for her time and efforts.  Dr. Hundley and Robin at [facility] were notified of above.  Patient returned study drug and calendars.  Pills were returned to the pharmacy and a pill count was taken in pharmacy by Brandy Persson, Pharm. D.  Return count equaled 166 pills.  

## 2015-03-07 NOTE — Assessment & Plan Note (Signed)
She has atypical chest pain which she thought was related to the study drug. She wants to come off research study which I think is reasonable.  Overall, I do not believe this is related to side effects of her current chemotherapy treatment.

## 2015-03-07 NOTE — Progress Notes (Signed)
[place] OFFICE PROGRESS NOTE  Patient Care Team: Arvella Nigh, MD as PCP - General (Obstetrics and Gynecology) Carola Frost, RN as Registered Nurse (Medical Oncology) [name] as Attending Physician (Internal Medicine)  SUMMARY OF ONCOLOGIC HISTORY:   Follicular lymphoma grade 3a   01/21/2015 Surgery She underwent excisional lymph node biopsy that came back follicular lymphoma grade 3   01/21/2015 Pathology Results Accession: [id number] biopsy confirmed follicular lymphoma   9/44/9675 Imaging Echocardiogram showed ejection fraction of 55-60%   02/11/2015 Imaging  PET CT scan show possible splenic involvement and diffuse lymphadenopathy throughout   02/11/2015 Bone Marrow Biopsy  bone marrow biopsy was performed and is involved by lymphoma with translocation of igH/BCL2   02/13/2015 Procedure She had port placement.   02/17/2015 -  Chemotherapy She received R-CHOP chemo   02/17/2015 Adverse Reaction She had mild infusion reaction with cycle 1 of treatment.    INTERVAL HISTORY: Please see below for problem oriented charting. She returns for further follow-up. Her mucositis and canker sores are almost completely resolved. She has nonspecific chest discomfort last week. Since discontinuation of research medication, though sensation had resolved. She denies any shortness of breath, dizziness or leg edema.  REVIEW OF SYSTEMS:   Constitutional: Denies fevers, chills or abnormal weight loss Eyes: Denies blurriness of vision Ears, nose, mouth, throat, and face: Denies mucositis or sore throat Respiratory: Denies cough, dyspnea or wheezes Cardiovascular: Denies palpitation, chest discomfort or lower extremity swelling Gastrointestinal:  Denies nausea, heartburn or change in bowel habits Skin: Denies abnormal skin rashes Lymphatics: Denies new lymphadenopathy or easy bruising Neurological:Denies numbness, tingling or new weaknesses Behavioral/Psych: Mood is stable, no new  changes  All other systems were reviewed with the patient and are negative.  I have reviewed the past medical history, past surgical history, social history and family history with the patient and they are unchanged from previous note.  ALLERGIES:  has No Known Allergies.  MEDICATIONS:  Current Outpatient Prescriptions  Medication Sig Dispense Refill  . acetaminophen (TYLENOL) 325 MG tablet Take 650 mg by mouth every 6 (six) hours as needed.    Marland Kitchen allopurinol (ZYLOPRIM) 300 MG tablet Take 1 tablet (300 mg total) by mouth daily. 30 tablet 0  . aspirin 81 MG tablet Take 81 mg by mouth daily.    Marland Kitchen levothyroxine (SYNTHROID, LEVOTHROID) 75 MCG tablet Take 100 mcg by mouth daily.     Marland Kitchen lidocaine-prilocaine (EMLA) cream Apply 1 application topically as needed. Apply to port a cath site one hour prior to needle stick 30 g 2  . loratadine (CLARITIN) 10 MG tablet Take 10 mg by mouth daily.    . Multiple Vitamins-Minerals (AIRBORNE GUMMIES PO) Take by mouth.    . ondansetron (ZOFRAN) 8 MG tablet Take 1 tablet (8 mg total) by mouth 2 (two) times daily. 30 tablet 1  . oxyCODONE-acetaminophen (ROXICET) 5-325 MG per tablet Take 1 tablet by mouth every 4 (four) hours as needed. 30 tablet 0  . predniSONE (DELTASONE) 20 MG tablet Take 3 tablets (60 mg total) by mouth daily. 30 tablet 1  . prochlorperazine (COMPAZINE) 10 MG tablet Take 1 tablet (10 mg total) by mouth every 6 (six) hours as needed (Nausea or vomiting). 30 tablet 6  . triamcinolone ointment (KENALOG) 0.1 % Apply 1 application topically 2 (two) times daily as needed (Itching).     No current facility-administered medications for this visit.    PHYSICAL EXAMINATION: ECOG PERFORMANCE STATUS: 0 - Asymptomatic  Filed  Vitals:   03/07/15 1310  BP: 120/81  Pulse: 90  Temp: 97.7 F (36.5 C)  Resp: 16   Filed Weights   03/07/15 1310  Weight: 132 lb 14.4 oz (60.283 kg)    GENERAL:alert, no distress and comfortable. She has total  alopecia SKIN: skin color, texture, turgor are normal, no rashes or significant lesions EYES: normal, Conjunctiva are pink and non-injected, sclera clear OROPHARYNX: she has healed canker sore on the right lower lip. NECK: supple, thyroid normal size, non-tender, without nodularity LYMPH:  no palpable lymphadenopathy in the cervical, axillary or inguinal LUNGS: clear to auscultation and percussion with normal breathing effort HEART: regular rate & rhythm and no murmurs and no lower extremity edema ABDOMEN:abdomen soft, non-tender and normal bowel sounds Musculoskeletal:no cyanosis of digits and no clubbing  NEURO: alert & oriented x 3 with fluent speech, no focal motor/sensory deficits  LABORATORY DATA:  I have reviewed the data as listed    Component Value Date/Time   NA 138 02/26/2015 0945   NA 142 01/16/2015 1130   K 4.4 02/26/2015 0945   K 4.0 01/16/2015 1130   CL 102 01/16/2015 1130   CO2 29 02/26/2015 0945   CO2 29 01/16/2015 1130   GLUCOSE 137 02/26/2015 0945   GLUCOSE 114* 01/16/2015 1130   BUN 24.5 02/26/2015 0945   BUN 19 01/16/2015 1130   CREATININE 1.1 02/26/2015 0945   CREATININE 1.03 01/16/2015 1130   CALCIUM 9.2 02/26/2015 0945   CALCIUM 9.5 01/16/2015 1130   PROT 6.4 02/26/2015 0945   PROT 6.9 01/16/2015 1130   ALBUMIN 3.9 02/26/2015 0945   ALBUMIN 3.7 01/16/2015 1130   AST 17 02/26/2015 0945   AST 25 01/16/2015 1130   ALT 15 02/26/2015 0945   ALT 20 01/16/2015 1130   ALKPHOS 128 02/26/2015 0945   ALKPHOS 93 01/16/2015 1130   BILITOT 0.42 02/26/2015 0945   BILITOT 0.7 01/16/2015 1130   GFRNONAA 53* 01/16/2015 1130   GFRAA 61* 01/16/2015 1130    No results found for: SPEP, UPEP  Lab Results  Component Value Date   WBC 10.1 03/07/2015   NEUTROABS 8.7* 03/07/2015   HGB 12.6 03/07/2015   HCT 38.4 03/07/2015   MCV 88.5 03/07/2015   PLT 283 03/07/2015      Chemistry      Component Value Date/Time   NA 138 02/26/2015 0945   NA 142 01/16/2015  1130   K 4.4 02/26/2015 0945   K 4.0 01/16/2015 1130   CL 102 01/16/2015 1130   CO2 29 02/26/2015 0945   CO2 29 01/16/2015 1130   BUN 24.5 02/26/2015 0945   BUN 19 01/16/2015 1130   CREATININE 1.1 02/26/2015 0945   CREATININE 1.03 01/16/2015 1130      Component Value Date/Time   CALCIUM 9.2 02/26/2015 0945   CALCIUM 9.5 01/16/2015 1130   ALKPHOS 128 02/26/2015 0945   ALKPHOS 93 01/16/2015 1130   AST 17 02/26/2015 0945   AST 25 01/16/2015 1130   ALT 15 02/26/2015 0945   ALT 20 01/16/2015 1130   BILITOT 0.42 02/26/2015 0945   BILITOT 0.7 01/16/2015 1130      ASSESSMENT & PLAN:  Follicular lymphoma grade 3a She tolerated chemotherapy well. All the lymph nodes had disappeared. Due to infusion reaction with cycle 1, I recommend we extend the duration of chemotherapy for 8 hours minimum  For cycle 2. If she tolerated cycle 2 well, we can direct infusion for cycle 3. I plan to see  her  In 3 weeks before chemotherapy with blood work, history and physical examination. Due to mucositis, I plan to reduce the dose of Cytoxan and Adriamycin by 10% in the future   Mucositis due to chemotherapy She has some very minor mucositis, improving. I recommend conservative management with baking soda mixed and salt gargle. I plan to reduce the dose of future chemotherapy by 10% for Cytoxan and Adriamycin.     Precordial pain  She has atypical chest pain which she thought was related to the study drug. She wants to come off research study which I think is reasonable.  Overall, I do not believe this is related to side effects of her current chemotherapy treatment.   Elevated troponin This was an incidental finding. I have consult with her cardiologist who felt that observation would be appropriate.  clinically, she has no signs of congestive heart failure.   Other fatigue She has mild fatigue which is not an expected side effects from treatment. I signed her application for temporarily  disability parking which I think is appropriate.    No orders of the defined types were placed in this encounter.   All questions were answered. The patient knows to call the clinic with any problems, questions or concerns. No barriers to learning was detected. I spent 25 minutes counseling the patient face to face. The total time spent in the appointment was 30 minutes and more than 50% was on counseling and review of test results     [facility], [place], MD 03/07/2015 1:30 PM

## 2015-03-10 ENCOUNTER — Ambulatory Visit: Payer: Medicare HMO

## 2015-03-10 ENCOUNTER — Ambulatory Visit (HOSPITAL_BASED_OUTPATIENT_CLINIC_OR_DEPARTMENT_OTHER): Payer: Medicare HMO

## 2015-03-10 VITALS — BP 111/65 | HR 56 | Temp 98.5°F | Resp 18

## 2015-03-10 DIAGNOSIS — Z5112 Encounter for antineoplastic immunotherapy: Secondary | ICD-10-CM | POA: Diagnosis not present

## 2015-03-10 DIAGNOSIS — C8230 Follicular lymphoma grade IIIa, unspecified site: Secondary | ICD-10-CM

## 2015-03-10 DIAGNOSIS — Z5111 Encounter for antineoplastic chemotherapy: Secondary | ICD-10-CM | POA: Diagnosis not present

## 2015-03-10 DIAGNOSIS — Z5189 Encounter for other specified aftercare: Secondary | ICD-10-CM

## 2015-03-10 MED ORDER — DIPHENHYDRAMINE HCL 25 MG PO CAPS
ORAL_CAPSULE | ORAL | Status: AC
  Filled 2015-03-10: qty 2

## 2015-03-10 MED ORDER — ACETAMINOPHEN 325 MG PO TABS
650.0000 mg | ORAL_TABLET | Freq: Once | ORAL | Status: AC
  Administered 2015-03-10: 650 mg via ORAL

## 2015-03-10 MED ORDER — SODIUM CHLORIDE 0.9 % IV SOLN
Freq: Once | INTRAVENOUS | Status: AC
  Administered 2015-03-10: 09:00:00 via INTRAVENOUS
  Filled 2015-03-10: qty 8

## 2015-03-10 MED ORDER — DIPHENHYDRAMINE HCL 25 MG PO CAPS
50.0000 mg | ORAL_CAPSULE | Freq: Once | ORAL | Status: AC
  Administered 2015-03-10: 50 mg via ORAL

## 2015-03-10 MED ORDER — DOXORUBICIN HCL CHEMO IV INJECTION 2 MG/ML
45.0000 mg/m2 | Freq: Once | INTRAVENOUS | Status: AC
Start: 2015-03-10 — End: 2015-03-10
  Administered 2015-03-10: 72 mg via INTRAVENOUS
  Filled 2015-03-10: qty 36

## 2015-03-10 MED ORDER — SODIUM CHLORIDE 0.9 % IV SOLN
675.0000 mg/m2 | Freq: Once | INTRAVENOUS | Status: AC
  Administered 2015-03-10: 1080 mg via INTRAVENOUS
  Filled 2015-03-10: qty 54

## 2015-03-10 MED ORDER — SODIUM CHLORIDE 0.9 % IV SOLN
Freq: Once | INTRAVENOUS | Status: AC
  Administered 2015-03-10: 09:00:00 via INTRAVENOUS

## 2015-03-10 MED ORDER — RITUXIMAB CHEMO INJECTION 500 MG/50ML
375.0000 mg/m2 | Freq: Once | INTRAVENOUS | Status: AC
  Administered 2015-03-10: 600 mg via INTRAVENOUS
  Filled 2015-03-10: qty 60

## 2015-03-10 MED ORDER — SODIUM CHLORIDE 0.9 % IJ SOLN
10.0000 mL | INTRAMUSCULAR | Status: DC | PRN
  Administered 2015-03-10: 10 mL
  Filled 2015-03-10: qty 10

## 2015-03-10 MED ORDER — PEGFILGRASTIM 6 MG/0.6ML ~~LOC~~ PSKT
6.0000 mg | PREFILLED_SYRINGE | Freq: Once | SUBCUTANEOUS | Status: AC
  Administered 2015-03-10: 6 mg via SUBCUTANEOUS
  Filled 2015-03-10: qty 0.6

## 2015-03-10 MED ORDER — HEPARIN SOD (PORK) LOCK FLUSH 100 UNIT/ML IV SOLN
500.0000 [IU] | Freq: Once | INTRAVENOUS | Status: AC | PRN
  Administered 2015-03-10: 500 [IU]
  Filled 2015-03-10: qty 5

## 2015-03-10 MED ORDER — ACETAMINOPHEN 325 MG PO TABS
ORAL_TABLET | ORAL | Status: AC
  Filled 2015-03-10: qty 2

## 2015-03-10 MED ORDER — VINCRISTINE SULFATE CHEMO INJECTION 1 MG/ML
2.0000 mg | Freq: Once | INTRAVENOUS | Status: AC
  Administered 2015-03-10: 2 mg via INTRAVENOUS
  Filled 2015-03-10: qty 2

## 2015-03-10 NOTE — Progress Notes (Signed)
Patient received chemotherapy with no issues. Patient discharged in stable condition.

## 2015-03-10 NOTE — Progress Notes (Signed)
No labs scheduled. Pt sent to chemo

## 2015-03-10 NOTE — Patient Instructions (Signed)
[place] Discharge Instructions for Patients Receiving Chemotherapy  Today you received the following chemotherapy agents Rituximab, Cytoxan, Vincristine, and Adriamycin.  To help prevent nausea and vomiting after your treatment, we encourage you to take your nausea medication as directed.    If you develop nausea and vomiting that is not controlled by your nausea medication, call the clinic.   BELOW ARE SYMPTOMS THAT SHOULD BE REPORTED IMMEDIATELY:  *FEVER GREATER THAN 100.5 F  *CHILLS WITH OR WITHOUT FEVER  NAUSEA AND VOMITING THAT IS NOT CONTROLLED WITH YOUR NAUSEA MEDICATION  *UNUSUAL SHORTNESS OF BREATH  *UNUSUAL BRUISING OR BLEEDING  TENDERNESS IN MOUTH AND THROAT WITH OR WITHOUT PRESENCE OF ULCERS  *URINARY PROBLEMS  *BOWEL PROBLEMS  UNUSUAL RASH Items with * indicate a potential emergency and should be followed up as soon as possible.  Feel free to call the clinic you have any questions or concerns. The clinic phone number is [phone].  Please show the [facility] at check-in to the Emergency Department and triage nurse.

## 2015-03-11 ENCOUNTER — Ambulatory Visit: Payer: Medicare HMO

## 2015-03-18 ENCOUNTER — Other Ambulatory Visit: Payer: Self-pay | Admitting: Hematology and Oncology

## 2015-03-19 ENCOUNTER — Other Ambulatory Visit: Payer: Self-pay | Admitting: Hematology and Oncology

## 2015-03-21 ENCOUNTER — Telehealth: Payer: Self-pay | Admitting: *Deleted

## 2015-03-21 NOTE — Telephone Encounter (Signed)
NOTIFIED PT. PER DR.GORSUCH- NO FURTHER REFILL IS NEEDED. ONLY THE FIRST THIRTY DAYS OF TREATMENT. PT. VOICES UNDERSTANDING.

## 2015-03-21 NOTE — Telephone Encounter (Signed)
PT.STATES THE PHARMACY TOLD HER THE REFILL FOR HER ALLOPURINOL WAS DENIED BY DR.GORSUCH'S OFFICE. DOES THE PT. NEED TO CONTINUE THIS MEDICATION? IF YES ANY ADDITIONAL REFILLS?

## 2015-03-21 NOTE — Telephone Encounter (Signed)
No further refill is needed Only first 30 days of Rx

## 2015-03-31 ENCOUNTER — Ambulatory Visit: Payer: Medicare HMO

## 2015-03-31 ENCOUNTER — Telehealth: Payer: Self-pay | Admitting: Hematology and Oncology

## 2015-03-31 ENCOUNTER — Ambulatory Visit (HOSPITAL_BASED_OUTPATIENT_CLINIC_OR_DEPARTMENT_OTHER): Payer: Medicare HMO | Admitting: Hematology and Oncology

## 2015-03-31 ENCOUNTER — Encounter: Payer: Self-pay | Admitting: Hematology and Oncology

## 2015-03-31 ENCOUNTER — Ambulatory Visit (HOSPITAL_BASED_OUTPATIENT_CLINIC_OR_DEPARTMENT_OTHER): Payer: Medicare Other

## 2015-03-31 ENCOUNTER — Encounter: Payer: Self-pay | Admitting: *Deleted

## 2015-03-31 ENCOUNTER — Other Ambulatory Visit (HOSPITAL_BASED_OUTPATIENT_CLINIC_OR_DEPARTMENT_OTHER): Payer: Medicare HMO

## 2015-03-31 VITALS — BP 116/62 | HR 89 | Temp 98.1°F | Resp 16

## 2015-03-31 VITALS — BP 153/85 | HR 79 | Temp 97.7°F | Resp 18 | Ht 61.0 in | Wt 135.4 lb

## 2015-03-31 DIAGNOSIS — C8230 Follicular lymphoma grade IIIa, unspecified site: Secondary | ICD-10-CM

## 2015-03-31 DIAGNOSIS — G8929 Other chronic pain: Secondary | ICD-10-CM | POA: Insufficient documentation

## 2015-03-31 DIAGNOSIS — Z006 Encounter for examination for normal comparison and control in clinical research program: Secondary | ICD-10-CM

## 2015-03-31 DIAGNOSIS — Z5189 Encounter for other specified aftercare: Secondary | ICD-10-CM

## 2015-03-31 DIAGNOSIS — G5622 Lesion of ulnar nerve, left upper limb: Secondary | ICD-10-CM | POA: Diagnosis not present

## 2015-03-31 DIAGNOSIS — M25512 Pain in left shoulder: Secondary | ICD-10-CM

## 2015-03-31 DIAGNOSIS — Z5112 Encounter for antineoplastic immunotherapy: Secondary | ICD-10-CM | POA: Diagnosis not present

## 2015-03-31 DIAGNOSIS — Z95828 Presence of other vascular implants and grafts: Secondary | ICD-10-CM

## 2015-03-31 LAB — CBC WITH DIFFERENTIAL/PLATELET
BASO%: 1.1 % (ref 0.0–2.0)
Basophils Absolute: 0.1 10*3/uL (ref 0.0–0.1)
EOS%: 2.4 % (ref 0.0–7.0)
Eosinophils Absolute: 0.2 10*3/uL (ref 0.0–0.5)
HCT: 36.1 % (ref 34.8–46.6)
HGB: 11.8 g/dL (ref 11.6–15.9)
LYMPH%: 12 % — ABNORMAL LOW (ref 14.0–49.7)
MCH: 29.6 pg (ref 25.1–34.0)
MCHC: 32.7 g/dL (ref 31.5–36.0)
MCV: 90.7 fL (ref 79.5–101.0)
MONO#: 0.9 10*3/uL (ref 0.1–0.9)
MONO%: 11.8 % (ref 0.0–14.0)
NEUT#: 5.8 10*3/uL (ref 1.5–6.5)
NEUT%: 72.7 % (ref 38.4–76.8)
Platelets: 310 10*3/uL (ref 145–400)
RBC: 3.98 10*6/uL (ref 3.70–5.45)
RDW: 17.5 % — ABNORMAL HIGH (ref 11.2–14.5)
WBC: 8 10*3/uL (ref 3.9–10.3)
lymph#: 1 10*3/uL (ref 0.9–3.3)

## 2015-03-31 LAB — COMPREHENSIVE METABOLIC PANEL (CC13)
ALT: 12 U/L (ref 0–55)
AST: 15 U/L (ref 5–34)
Albumin: 3.6 g/dL (ref 3.5–5.0)
Alkaline Phosphatase: 83 U/L (ref 40–150)
Anion Gap: 10 mEq/L (ref 3–11)
BUN: 22.8 mg/dL (ref 7.0–26.0)
CO2: 24 mEq/L (ref 22–29)
Calcium: 9.1 mg/dL (ref 8.4–10.4)
Chloride: 106 mEq/L (ref 98–109)
Creatinine: 0.8 mg/dL (ref 0.6–1.1)
EGFR: 71 mL/min/{1.73_m2} — ABNORMAL LOW (ref >90)
Glucose: 93 mg/dl (ref 70–140)
Potassium: 4.3 mEq/L (ref 3.5–5.1)
Sodium: 141 mEq/L (ref 136–145)
Total Bilirubin: 0.38 mg/dL (ref 0.20–1.20)
Total Protein: 6.4 g/dL (ref 6.4–8.3)

## 2015-03-31 MED ORDER — SODIUM CHLORIDE 0.9 % IV SOLN
2.0000 mg | Freq: Once | INTRAVENOUS | Status: AC
  Administered 2015-03-31: 2 mg via INTRAVENOUS
  Filled 2015-03-31: qty 2

## 2015-03-31 MED ORDER — PEGFILGRASTIM 6 MG/0.6ML ~~LOC~~ PSKT
6.0000 mg | PREFILLED_SYRINGE | Freq: Once | SUBCUTANEOUS | Status: AC
  Administered 2015-03-31: 6 mg via SUBCUTANEOUS
  Filled 2015-03-31: qty 0.6

## 2015-03-31 MED ORDER — ACETAMINOPHEN 325 MG PO TABS
ORAL_TABLET | ORAL | Status: AC
  Filled 2015-03-31: qty 2

## 2015-03-31 MED ORDER — SODIUM CHLORIDE 0.9 % IV SOLN
Freq: Once | INTRAVENOUS | Status: AC
  Administered 2015-03-31: 10:00:00 via INTRAVENOUS
  Filled 2015-03-31: qty 8

## 2015-03-31 MED ORDER — DOXORUBICIN HCL CHEMO IV INJECTION 2 MG/ML
45.0000 mg/m2 | Freq: Once | INTRAVENOUS | Status: AC
  Administered 2015-03-31: 72 mg via INTRAVENOUS
  Filled 2015-03-31: qty 36

## 2015-03-31 MED ORDER — SODIUM CHLORIDE 0.9 % IV SOLN
675.0000 mg/m2 | Freq: Once | INTRAVENOUS | Status: AC
  Administered 2015-03-31: 1080 mg via INTRAVENOUS
  Filled 2015-03-31: qty 54

## 2015-03-31 MED ORDER — HEPARIN SOD (PORK) LOCK FLUSH 100 UNIT/ML IV SOLN
500.0000 [IU] | Freq: Once | INTRAVENOUS | Status: AC | PRN
  Administered 2015-03-31: 500 [IU]
  Filled 2015-03-31: qty 5

## 2015-03-31 MED ORDER — SODIUM CHLORIDE 0.9 % IV SOLN
250.0000 mL | Freq: Once | INTRAVENOUS | Status: AC
  Administered 2015-03-31: 250 mL via INTRAVENOUS

## 2015-03-31 MED ORDER — SODIUM CHLORIDE 0.9 % IV SOLN
375.0000 mg/m2 | Freq: Once | INTRAVENOUS | Status: AC
  Administered 2015-03-31: 600 mg via INTRAVENOUS
  Filled 2015-03-31: qty 60

## 2015-03-31 MED ORDER — SODIUM CHLORIDE 0.9 % IJ SOLN
10.0000 mL | INTRAMUSCULAR | Status: DC | PRN
  Administered 2015-03-31: 10 mL via INTRAVENOUS
  Filled 2015-03-31: qty 10

## 2015-03-31 MED ORDER — SODIUM CHLORIDE 0.9 % IJ SOLN
10.0000 mL | INTRAMUSCULAR | Status: DC | PRN
  Administered 2015-03-31: 10 mL
  Filled 2015-03-31: qty 10

## 2015-03-31 MED ORDER — DIPHENHYDRAMINE HCL 25 MG PO CAPS
ORAL_CAPSULE | ORAL | Status: AC
  Filled 2015-03-31: qty 2

## 2015-03-31 MED ORDER — ACETAMINOPHEN 325 MG PO TABS
650.0000 mg | ORAL_TABLET | Freq: Once | ORAL | Status: AC
  Administered 2015-03-31: 650 mg via ORAL

## 2015-03-31 MED ORDER — DIPHENHYDRAMINE HCL 25 MG PO CAPS
50.0000 mg | ORAL_CAPSULE | Freq: Once | ORAL | Status: AC
  Administered 2015-03-31: 50 mg via ORAL

## 2015-03-31 NOTE — Assessment & Plan Note (Signed)
She is doing very well upon from minor expected side effects. I will proceed with treatment today without dose adjustment. I will order a PET CT scan for staging prior to her next scheduled chemotherapy. I also discussed with her about consideration to resume clinical trial but she declined.

## 2015-03-31 NOTE — Patient Instructions (Signed)
[facility] Discharge Instructions for Patients Receiving Chemotherapy  Today you received the following chemotherapy agents: Adriamycin, Vincristine, Cytoxan, Rituxan  To help prevent nausea and vomiting after your treatment, we encourage you to take your nausea medication as prescribed by your physician.   If you develop nausea and vomiting that is not controlled by your nausea medication, call the clinic.   BELOW ARE SYMPTOMS THAT SHOULD BE REPORTED IMMEDIATELY:  *FEVER GREATER THAN 100.5 F  *CHILLS WITH OR WITHOUT FEVER  NAUSEA AND VOMITING THAT IS NOT CONTROLLED WITH YOUR NAUSEA MEDICATION  *UNUSUAL SHORTNESS OF BREATH  *UNUSUAL BRUISING OR BLEEDING  TENDERNESS IN MOUTH AND THROAT WITH OR WITHOUT PRESENCE OF ULCERS  *URINARY PROBLEMS  *BOWEL PROBLEMS  UNUSUAL RASH Items with * indicate a potential emergency and should be followed up as soon as possible.  Feel free to call the clinic you have any questions or concerns. The clinic phone number is [phone].  Please show the [facility] at check-in to the Emergency Department and triage nurse.

## 2015-03-31 NOTE — Patient Instructions (Signed)

## 2015-03-31 NOTE — Telephone Encounter (Signed)
err

## 2015-03-31 NOTE — Assessment & Plan Note (Signed)
She have mild neuropathy at the ulnar distribution on the left hand which I suspect it could be related to possible nerve injury in the left upper extremity, exacerbated by chemotherapy. It is very mild in nature. I recommend observation for now.

## 2015-03-31 NOTE — Progress Notes (Signed)
03/31/2015 0855  PREVENT Met with the patient and her family.  The patient stated that she has not experienced any more abdominal/chest pain or discomfort since she stopped the study drug.  She stated that she was not interested in going back on the study drug to try it again and see what happens.  Dr. Alvy Bimler brought the subject up with the patient and the patient confirmed she would not try the study drug again.  She agreed to continue follow-up on the study.  This RN thanked the patient for her time. [name], RN Clinical Research

## 2015-03-31 NOTE — Assessment & Plan Note (Signed)
This is new, likely related to internal muscular bruising from recent on pro-injection. I recommend Tylenol for now. I discussed with the patient whether she would be willing to go back to Neulasta that she wants to try on-pro one more time.

## 2015-03-31 NOTE — Telephone Encounter (Signed)
Gave patient avs report and appointments for July.  °

## 2015-03-31 NOTE — Progress Notes (Signed)
[place] OFFICE PROGRESS NOTE  Patient Care Team: Arvella Nigh, MD as PCP - General (Obstetrics and Gynecology) Carola Frost, RN as Registered Nurse (Medical Oncology) [name] as Attending Physician (Internal Medicine)  SUMMARY OF ONCOLOGIC HISTORY:   Follicular lymphoma grade 3a   01/21/2015 Surgery She underwent excisional lymph node biopsy that came back follicular lymphoma grade 3   01/21/2015 Pathology Results Accession: [id number] biopsy confirmed follicular lymphoma   3/78/5885 Imaging Echocardiogram showed ejection fraction of 55-60%   02/11/2015 Imaging  PET CT scan show possible splenic involvement and diffuse lymphadenopathy throughout   02/11/2015 Bone Marrow Biopsy  bone marrow biopsy was performed and is involved by lymphoma with translocation of igH/BCL2   02/13/2015 Procedure She had port placement.   02/17/2015 -  Chemotherapy She received R-CHOP chemo   02/17/2015 Adverse Reaction She had mild infusion reaction with cycle 1 of treatment.    INTERVAL HISTORY: Please see below for problem oriented charting. She returns prior to cycle 3 of chemotherapy. She denies recent infusion reaction. With recent On-Pro injection, she developed significant pain in the left shoulder with some associated numbness left hand in the ulnar distribution. She denies weakness. She denies mucositis nausea vomiting or diarrhea. Denies new lymphadenopathy.  REVIEW OF SYSTEMS:   Constitutional: Denies fevers, chills or abnormal weight loss Eyes: Denies blurriness of vision Ears, nose, mouth, throat, and face: Denies mucositis or sore throat Respiratory: Denies cough, dyspnea or wheezes Cardiovascular: Denies palpitation, chest discomfort or lower extremity swelling Gastrointestinal:  Denies nausea, heartburn or change in bowel habits Skin: Denies abnormal skin rashes Lymphatics: Denies new lymphadenopathy or easy bruising Neurological:Denies numbness, tingling   Behavioral/Psych: Mood is stable, no new changes  All other systems were reviewed with the patient and are negative.  I have reviewed the past medical history, past surgical history, social history and family history with the patient and they are unchanged from previous note.  ALLERGIES:  has No Known Allergies.  MEDICATIONS:  Current Outpatient Prescriptions  Medication Sig Dispense Refill  . acetaminophen (TYLENOL) 325 MG tablet Take 650 mg by mouth every 6 (six) hours as needed.    Marland Kitchen aspirin 81 MG tablet Take 81 mg by mouth daily.    Marland Kitchen levothyroxine (SYNTHROID, LEVOTHROID) 100 MCG tablet Take 100 mcg by mouth daily.  11  . levothyroxine (SYNTHROID, LEVOTHROID) 75 MCG tablet Take 100 mcg by mouth daily.     Marland Kitchen lidocaine-prilocaine (EMLA) cream Apply 1 application topically as needed. Apply to port a cath site one hour prior to needle stick 30 g 2  . loratadine (CLARITIN) 10 MG tablet Take 10 mg by mouth daily.    . Multiple Vitamins-Minerals (AIRBORNE GUMMIES PO) Take by mouth.    . ondansetron (ZOFRAN) 8 MG tablet Take 1 tablet (8 mg total) by mouth 2 (two) times daily. 30 tablet 1  . oxyCODONE-acetaminophen (ROXICET) 5-325 MG per tablet Take 1 tablet by mouth every 4 (four) hours as needed. 30 tablet 0  . predniSONE (DELTASONE) 20 MG tablet Take 3 tabs on days 2-5 every cycle of 21 days 30 tablet 1  . prochlorperazine (COMPAZINE) 10 MG tablet Take 1 tablet (10 mg total) by mouth every 6 (six) hours as needed (Nausea or vomiting). 30 tablet 6  . triamcinolone ointment (KENALOG) 0.1 % Apply 1 application topically 2 (two) times daily as needed (Itching).     No current facility-administered medications for this visit.    PHYSICAL EXAMINATION: ECOG PERFORMANCE STATUS:  1 - Symptomatic but completely ambulatory  Filed Vitals:   03/31/15 0828  BP: 153/85  Pulse: 79  Temp: 97.7 F (36.5 C)  Resp: 18   Filed Weights   03/31/15 0828  Weight: 135 lb 6.4 oz (61.417 kg)     GENERAL:alert, no distress and comfortable SKIN: skin color, texture, turgor are normal, no rashes or significant lesions EYES: normal, Conjunctiva are pink and non-injected, sclera clear OROPHARYNX:no exudate, no erythema and lips, buccal mucosa, and tongue normal  NECK: supple, thyroid normal size, non-tender, without nodularity LYMPH:  no palpable lymphadenopathy in the cervical, axillary or inguinal LUNGS: clear to auscultation and percussion with normal breathing effort HEART: regular rate & rhythm and no murmurs and no lower extremity edema ABDOMEN:abdomen soft, non-tender and normal bowel sounds Musculoskeletal:no cyanosis of digits and no clubbing  NEURO: alert & oriented x 3 with fluent speech, no focal motor/sensory deficits  LABORATORY DATA:  I have reviewed the data as listed    Component Value Date/Time   NA 141 03/31/2015 0806   NA 142 01/16/2015 1130   K 4.3 03/31/2015 0806   K 4.0 01/16/2015 1130   CL 102 01/16/2015 1130   CO2 24 03/31/2015 0806   CO2 29 01/16/2015 1130   GLUCOSE 93 03/31/2015 0806   GLUCOSE 114* 01/16/2015 1130   BUN 22.8 03/31/2015 0806   BUN 19 01/16/2015 1130   CREATININE 0.8 03/31/2015 0806   CREATININE 1.03 01/16/2015 1130   CALCIUM 9.1 03/31/2015 0806   CALCIUM 9.5 01/16/2015 1130   PROT 6.4 03/31/2015 0806   PROT 6.9 01/16/2015 1130   ALBUMIN 3.6 03/31/2015 0806   ALBUMIN 3.7 01/16/2015 1130   AST 15 03/31/2015 0806   AST 25 01/16/2015 1130   ALT 12 03/31/2015 0806   ALT 20 01/16/2015 1130   ALKPHOS 83 03/31/2015 0806   ALKPHOS 93 01/16/2015 1130   BILITOT 0.38 03/31/2015 0806   BILITOT 0.7 01/16/2015 1130   GFRNONAA 53* 01/16/2015 1130   GFRAA 61* 01/16/2015 1130    No results found for: SPEP, UPEP  Lab Results  Component Value Date   WBC 8.0 03/31/2015   NEUTROABS 5.8 03/31/2015   HGB 11.8 03/31/2015   HCT 36.1 03/31/2015   MCV 90.7 03/31/2015   PLT 310 03/31/2015      Chemistry      Component Value  Date/Time   NA 141 03/31/2015 0806   NA 142 01/16/2015 1130   K 4.3 03/31/2015 0806   K 4.0 01/16/2015 1130   CL 102 01/16/2015 1130   CO2 24 03/31/2015 0806   CO2 29 01/16/2015 1130   BUN 22.8 03/31/2015 0806   BUN 19 01/16/2015 1130   CREATININE 0.8 03/31/2015 0806   CREATININE 1.03 01/16/2015 1130      Component Value Date/Time   CALCIUM 9.1 03/31/2015 0806   CALCIUM 9.5 01/16/2015 1130   ALKPHOS 83 03/31/2015 0806   ALKPHOS 93 01/16/2015 1130   AST 15 03/31/2015 0806   AST 25 01/16/2015 1130   ALT 12 03/31/2015 0806   ALT 20 01/16/2015 1130   BILITOT 0.38 03/31/2015 0806   BILITOT 0.7 01/16/2015 1130      ASSESSMENT & PLAN:  Follicular lymphoma grade 3a She is doing very well upon from minor expected side effects. I will proceed with treatment today without dose adjustment. I will order a PET CT scan for staging prior to her next scheduled chemotherapy. I also discussed with her about consideration to resume clinical  trial but she declined.  Ulnar neuropathy of left upper extremity She have mild neuropathy at the ulnar distribution on the left hand which I suspect it could be related to possible nerve injury in the left upper extremity, exacerbated by chemotherapy. It is very mild in nature. I recommend observation for now.  Left shoulder pain This is new, likely related to internal muscular bruising from recent on pro-injection. I recommend Tylenol for now. I discussed with the patient whether she would be willing to go back to Neulasta that she wants to try on-pro one more time.   Orders Placed This Encounter  Procedures  . NM PET Image Restag (PS) Skull Base To Thigh    Standing Status: Future     Number of Occurrences:      Standing Expiration Date: 05/30/2016    Order Specific Question:  Reason for Exam (SYMPTOM  OR DIAGNOSIS REQUIRED)    Answer:  staging lymphoma assess response to Rx    Order Specific Question:  Preferred imaging location?    Answer:   [facility]   All questions were answered. The patient knows to call the clinic with any problems, questions or concerns. No barriers to learning was detected. I spent 30 minutes counseling the patient face to face. The total time spent in the appointment was 40 minutes and more than 50% was on counseling and review of test results     [facility], [place], MD 03/31/2015 9:25 AM

## 2015-04-01 ENCOUNTER — Telehealth: Payer: Self-pay | Admitting: *Deleted

## 2015-04-01 ENCOUNTER — Ambulatory Visit: Payer: Medicare HMO

## 2015-04-01 NOTE — Telephone Encounter (Signed)
PT. HAS TALKED WITH HER INSURANCE COMPANY AND THE PHARMACY. THE ISSUE IS RESOLVED.

## 2015-04-02 ENCOUNTER — Telehealth: Payer: Self-pay | Admitting: *Deleted

## 2015-04-02 NOTE — Telephone Encounter (Signed)
[facility] at [place] faxed Prior authorization request for Prednisone 20 mg.  Request to Managed [employer] for review.

## 2015-04-08 ENCOUNTER — Other Ambulatory Visit: Payer: Self-pay | Admitting: Hematology and Oncology

## 2015-04-08 ENCOUNTER — Telehealth: Payer: Self-pay | Admitting: *Deleted

## 2015-04-08 ENCOUNTER — Telehealth: Payer: Self-pay | Admitting: Hematology and Oncology

## 2015-04-08 NOTE — Telephone Encounter (Signed)
Informed pt of appt tomorrow morning.  Arrive at 8 am for lab, then MD at 8;30 am and IVFs.  Expect to be here til about noon.  Pt verbalized understanding.  Also instructed to take tyelnol 1000 mg three times a day per Dr. Alvy Bimler.  She verbalized understanding.

## 2015-04-08 NOTE — Telephone Encounter (Signed)
s.w. pt and advised on June appt....pt ok and aware °

## 2015-04-08 NOTE — Telephone Encounter (Signed)
Pt left VM stating she has been very weak and has no energy for past three days.  She also c/o right leg pain since 4 am.    Called pt back and instructed her to come in today for lab and to see Dr. Alvy Bimler.   Pt states she has no one to bring her in today.  She says she can try to line up a ride for tomorrow.  Can she come in to see Dr Alvy Bimler tomorrow?

## 2015-04-09 ENCOUNTER — Ambulatory Visit: Payer: Medicare HMO

## 2015-04-09 ENCOUNTER — Ambulatory Visit (HOSPITAL_BASED_OUTPATIENT_CLINIC_OR_DEPARTMENT_OTHER): Payer: Medicare HMO | Admitting: Hematology and Oncology

## 2015-04-09 ENCOUNTER — Other Ambulatory Visit (HOSPITAL_BASED_OUTPATIENT_CLINIC_OR_DEPARTMENT_OTHER): Payer: Medicare HMO

## 2015-04-09 VITALS — BP 126/70 | HR 82 | Temp 97.9°F | Resp 18 | Ht 61.0 in | Wt 134.3 lb

## 2015-04-09 DIAGNOSIS — M25561 Pain in right knee: Secondary | ICD-10-CM

## 2015-04-09 DIAGNOSIS — C8230 Follicular lymphoma grade IIIa, unspecified site: Secondary | ICD-10-CM

## 2015-04-09 DIAGNOSIS — G8929 Other chronic pain: Secondary | ICD-10-CM | POA: Insufficient documentation

## 2015-04-09 DIAGNOSIS — Z96651 Presence of right artificial knee joint: Secondary | ICD-10-CM | POA: Insufficient documentation

## 2015-04-09 DIAGNOSIS — K1231 Oral mucositis (ulcerative) due to antineoplastic therapy: Secondary | ICD-10-CM | POA: Diagnosis not present

## 2015-04-09 LAB — COMPREHENSIVE METABOLIC PANEL (CC13)
ALT: 14 U/L (ref 0–55)
AST: 13 U/L (ref 5–34)
Albumin: 3.8 g/dL (ref 3.5–5.0)
Alkaline Phosphatase: 102 U/L (ref 40–150)
Anion Gap: 7 mEq/L (ref 3–11)
BUN: 18.4 mg/dL (ref 7.0–26.0)
CO2: 29 mEq/L (ref 22–29)
Calcium: 9.4 mg/dL (ref 8.4–10.4)
Chloride: 103 mEq/L (ref 98–109)
Creatinine: 0.9 mg/dL (ref 0.6–1.1)
EGFR: 64 mL/min/{1.73_m2} — ABNORMAL LOW (ref >90)
Glucose: 129 mg/dl (ref 70–140)
Potassium: 4.4 mEq/L (ref 3.5–5.1)
Sodium: 139 mEq/L (ref 136–145)
Total Bilirubin: 0.31 mg/dL (ref 0.20–1.20)
Total Protein: 6.3 g/dL — ABNORMAL LOW (ref 6.4–8.3)

## 2015-04-09 LAB — CBC WITH DIFFERENTIAL/PLATELET
BASO%: 1.2 % (ref 0.0–2.0)
Basophils Absolute: 0.1 10*3/uL (ref 0.0–0.1)
EOS%: 5.7 % (ref 0.0–7.0)
Eosinophils Absolute: 0.4 10*3/uL (ref 0.0–0.5)
HCT: 37 % (ref 34.8–46.6)
HGB: 12.3 g/dL (ref 11.6–15.9)
LYMPH%: 13.1 % — ABNORMAL LOW (ref 14.0–49.7)
MCH: 29.8 pg (ref 25.1–34.0)
MCHC: 33.3 g/dL (ref 31.5–36.0)
MCV: 89.4 fL (ref 79.5–101.0)
MONO#: 0.5 10*3/uL (ref 0.1–0.9)
MONO%: 6.6 % (ref 0.0–14.0)
NEUT#: 5.8 10*3/uL (ref 1.5–6.5)
NEUT%: 73.4 % (ref 38.4–76.8)
Platelets: 200 10*3/uL (ref 145–400)
RBC: 4.14 10*6/uL (ref 3.70–5.45)
RDW: 18.7 % — ABNORMAL HIGH (ref 11.2–14.5)
WBC: 7.9 10*3/uL (ref 3.9–10.3)
lymph#: 1 10*3/uL (ref 0.9–3.3)

## 2015-04-09 MED ORDER — OXYCODONE-ACETAMINOPHEN 5-325 MG PO TABS: 1.0000 | ORAL_TABLET | ORAL | Status: DC | PRN

## 2015-04-09 NOTE — Assessment & Plan Note (Signed)
She has severe right knee pain which I suspect is related to side effects of Neulasta. I recommend she takes pain medicine as needed to control this pain. I also recommend consideration for local treatment such as lidocaine numbing cream at the local area to help with the pain.

## 2015-04-09 NOTE — Assessment & Plan Note (Signed)
This is minor. Recommend observation only.

## 2015-04-09 NOTE — Progress Notes (Signed)
[facility] OFFICE PROGRESS NOTE  Patient Care Team: Arvella Nigh, MD as PCP - General (Obstetrics and Gynecology) Carola Frost, RN as Registered Nurse (Medical Oncology) [name] as Attending Physician (Internal Medicine)  SUMMARY OF ONCOLOGIC HISTORY:   Follicular lymphoma grade 3a   01/21/2015 Surgery She underwent excisional lymph node biopsy that came back follicular lymphoma grade 3   01/21/2015 Pathology Results Accession: [id number] biopsy confirmed follicular lymphoma   02/03/6221 Imaging Echocardiogram showed ejection fraction of 55-60%   02/11/2015 Imaging  PET CT scan show possible splenic involvement and diffuse lymphadenopathy throughout   02/11/2015 Bone Marrow Biopsy  bone marrow biopsy was performed and is involved by lymphoma with translocation of igH/BCL2   02/13/2015 Procedure She had port placement.   02/17/2015 -  Chemotherapy She received R-CHOP chemo   02/17/2015 Adverse Reaction She had mild infusion reaction with cycle 1 of treatment.    INTERVAL HISTORY: Please see below for problem oriented charting. She is seen urgently today because of severe right knee pain that started over the weekend. It is causing significant weakness in the right leg. She only took 1 Tylenol for that. She also complained of very mild nausea and mucositis. Denies fevers or chills.  REVIEW OF SYSTEMS:   Constitutional: Denies fevers, chills or abnormal weight loss Eyes: Denies blurriness of vision Respiratory: Denies cough, dyspnea or wheezes Cardiovascular: Denies palpitation, chest discomfort or lower extremity swelling Gastrointestinal:  Denies nausea, heartburn or change in bowel habits Skin: Denies abnormal skin rashes Lymphatics: Denies new lymphadenopathy or easy bruising Neurological:Denies numbness, tingling or new weaknesses Behavioral/Psych: Mood is stable, no new changes  All other systems were reviewed with the patient and are negative.  I have reviewed  the past medical history, past surgical history, social history and family history with the patient and they are unchanged from previous note.  ALLERGIES:  has No Known Allergies.  MEDICATIONS:  Current Outpatient Prescriptions  Medication Sig Dispense Refill  . acetaminophen (TYLENOL) 325 MG tablet Take 650 mg by mouth every 6 (six) hours as needed.    Marland Kitchen aspirin 81 MG tablet Take 81 mg by mouth daily.    Marland Kitchen levothyroxine (SYNTHROID, LEVOTHROID) 100 MCG tablet Take 100 mcg by mouth daily.  11  . levothyroxine (SYNTHROID, LEVOTHROID) 75 MCG tablet Take 100 mcg by mouth daily.     Marland Kitchen lidocaine-prilocaine (EMLA) cream Apply 1 application topically as needed. Apply to port a cath site one hour prior to needle stick 30 g 2  . loratadine (CLARITIN) 10 MG tablet Take 10 mg by mouth daily.    . Multiple Vitamins-Minerals (AIRBORNE GUMMIES PO) Take by mouth.    . ondansetron (ZOFRAN) 8 MG tablet Take 1 tablet (8 mg total) by mouth 2 (two) times daily. 30 tablet 1  . oxyCODONE-acetaminophen (ROXICET) 5-325 MG per tablet Take 1 tablet by mouth every 4 (four) hours as needed. 30 tablet 0  . predniSONE (DELTASONE) 20 MG tablet Take 3 tabs on days 2-5 every cycle of 21 days 30 tablet 1  . prochlorperazine (COMPAZINE) 10 MG tablet Take 1 tablet (10 mg total) by mouth every 6 (six) hours as needed (Nausea or vomiting). 30 tablet 6  . triamcinolone ointment (KENALOG) 0.1 % Apply 1 application topically 2 (two) times daily as needed (Itching).     No current facility-administered medications for this visit.    PHYSICAL EXAMINATION: ECOG PERFORMANCE STATUS: 1 - Symptomatic but completely ambulatory  Filed Vitals:   04/09/15  0838  BP: 126/70  Pulse: 82  Temp: 97.9 F (36.6 C)  Resp: 18   Filed Weights   04/09/15 0838  Weight: 134 lb 4.8 oz (60.918 kg)    GENERAL:alert, no distress and comfortable SKIN: skin color, texture, turgor are normal, no rashes or significant lesions EYES: normal,  Conjunctiva are pink and non-injected, sclera clear OROPHARYNX:no exudate, no erythema and lips, buccal mucosa, and tongue normal  NECK: supple, thyroid normal size, non-tender, without nodularity LYMPH:  no palpable lymphadenopathy in the cervical, axillary or inguinal LUNGS: clear to auscultation and percussion with normal breathing effort HEART: regular rate & rhythm and no murmurs and no lower extremity edema ABDOMEN:abdomen soft, non-tender and normal bowel sounds Musculoskeletal:no cyanosis of digits and no clubbing. She has minimum right knee pain in the joint area without evidence of joint effusion or erythema. NEURO: alert & oriented x 3 with fluent speech, no focal motor/sensory deficits  LABORATORY DATA:  I have reviewed the data as listed    Component Value Date/Time   NA 139 04/09/2015 0750   NA 142 01/16/2015 1130   K 4.4 04/09/2015 0750   K 4.0 01/16/2015 1130   CL 102 01/16/2015 1130   CO2 29 04/09/2015 0750   CO2 29 01/16/2015 1130   GLUCOSE 129 04/09/2015 0750   GLUCOSE 114* 01/16/2015 1130   BUN 18.4 04/09/2015 0750   BUN 19 01/16/2015 1130   CREATININE 0.9 04/09/2015 0750   CREATININE 1.03 01/16/2015 1130   CALCIUM 9.4 04/09/2015 0750   CALCIUM 9.5 01/16/2015 1130   PROT 6.3* 04/09/2015 0750   PROT 6.9 01/16/2015 1130   ALBUMIN 3.8 04/09/2015 0750   ALBUMIN 3.7 01/16/2015 1130   AST 13 04/09/2015 0750   AST 25 01/16/2015 1130   ALT 14 04/09/2015 0750   ALT 20 01/16/2015 1130   ALKPHOS 102 04/09/2015 0750   ALKPHOS 93 01/16/2015 1130   BILITOT 0.31 04/09/2015 0750   BILITOT 0.7 01/16/2015 1130   GFRNONAA 53* 01/16/2015 1130   GFRAA 61* 01/16/2015 1130    No results found for: SPEP, UPEP  Lab Results  Component Value Date   WBC 7.9 04/09/2015   NEUTROABS 5.8 04/09/2015   HGB 12.3 04/09/2015   HCT 37.0 04/09/2015   MCV 89.4 04/09/2015   PLT 200 04/09/2015      Chemistry      Component Value Date/Time   NA 139 04/09/2015 0750   NA 142  01/16/2015 1130   K 4.4 04/09/2015 0750   K 4.0 01/16/2015 1130   CL 102 01/16/2015 1130   CO2 29 04/09/2015 0750   CO2 29 01/16/2015 1130   BUN 18.4 04/09/2015 0750   BUN 19 01/16/2015 1130   CREATININE 0.9 04/09/2015 0750   CREATININE 1.03 01/16/2015 1130      Component Value Date/Time   CALCIUM 9.4 04/09/2015 0750   CALCIUM 9.5 01/16/2015 1130   ALKPHOS 102 04/09/2015 0750   ALKPHOS 93 01/16/2015 1130   AST 13 04/09/2015 0750   AST 25 01/16/2015 1130   ALT 14 04/09/2015 0750   ALT 20 01/16/2015 1130   BILITOT 0.31 04/09/2015 0750   BILITOT 0.7 01/16/2015 1130      ASSESSMENT & PLAN:  Follicular lymphoma grade 3a She is doing very well upon from minor expected side effects.   Mucositis due to chemotherapy This is minor. Recommend observation only.  Right knee pain She has severe right knee pain which I suspect is related to side effects  of Neulasta. I recommend she takes pain medicine as needed to control this pain. I also recommend consideration for local treatment such as lidocaine numbing cream at the local area to help with the pain.   No orders of the defined types were placed in this encounter.   All questions were answered. The patient knows to call the clinic with any problems, questions or concerns. No barriers to learning was detected. I spent 15 minutes counseling the patient face to face. The total time spent in the appointment was 20 minutes and more than 50% was on counseling and review of test results     [facility], Varnell, MD 04/09/2015 9:10 AM

## 2015-04-09 NOTE — Assessment & Plan Note (Signed)
She is doing very well upon from minor expected side effects.

## 2015-04-14 ENCOUNTER — Other Ambulatory Visit: Payer: Self-pay

## 2015-04-18 ENCOUNTER — Telehealth: Payer: Self-pay | Admitting: Hematology and Oncology

## 2015-04-18 ENCOUNTER — Ambulatory Visit (HOSPITAL_COMMUNITY)
Admission: RE | Admit: 2015-04-18 | Discharge: 2015-04-18 | Disposition: A | Discharge status: Home / Self Care | Payer: Medicare HMO | Source: Ambulatory Visit | Attending: Hematology and Oncology | Admitting: Hematology and Oncology

## 2015-04-18 DIAGNOSIS — C8230 Follicular lymphoma grade IIIa, unspecified site: Secondary | ICD-10-CM | POA: Insufficient documentation

## 2015-04-18 LAB — GLUCOSE, CAPILLARY: Glucose-Capillary: 103 mg/dL — ABNORMAL HIGH (ref 65–99)

## 2015-04-18 MED ORDER — FLUDEOXYGLUCOSE F - 18 (FDG) INJECTION
6.6200 | Freq: Once | INTRAVENOUS | Status: AC | PRN
  Administered 2015-04-18: 6.62 via INTRAVENOUS

## 2015-04-18 MED ORDER — FLUDEOXYGLUCOSE F - 18 (FDG) INJECTION: 6.6000 | Freq: Once | INTRAVENOUS | Status: AC | PRN

## 2015-04-18 NOTE — Telephone Encounter (Signed)
s.w. pt and confirmed appts....pt ok adn aware °

## 2015-04-22 ENCOUNTER — Other Ambulatory Visit (HOSPITAL_BASED_OUTPATIENT_CLINIC_OR_DEPARTMENT_OTHER): Payer: Medicare HMO

## 2015-04-22 ENCOUNTER — Ambulatory Visit (HOSPITAL_BASED_OUTPATIENT_CLINIC_OR_DEPARTMENT_OTHER): Payer: Medicare HMO

## 2015-04-22 ENCOUNTER — Encounter: Payer: Self-pay | Admitting: Hematology and Oncology

## 2015-04-22 ENCOUNTER — Ambulatory Visit (HOSPITAL_BASED_OUTPATIENT_CLINIC_OR_DEPARTMENT_OTHER): Payer: Medicare HMO | Admitting: Hematology and Oncology

## 2015-04-22 ENCOUNTER — Telehealth: Payer: Self-pay | Admitting: Hematology and Oncology

## 2015-04-22 VITALS — BP 133/87 | HR 88 | Temp 97.6°F | Resp 18 | Ht 61.0 in | Wt 136.0 lb

## 2015-04-22 VITALS — BP 125/68 | HR 93 | Temp 98.3°F | Resp 18

## 2015-04-22 DIAGNOSIS — Z5112 Encounter for antineoplastic immunotherapy: Secondary | ICD-10-CM

## 2015-04-22 DIAGNOSIS — T451X5A Adverse effect of antineoplastic and immunosuppressive drugs, initial encounter: Secondary | ICD-10-CM

## 2015-04-22 DIAGNOSIS — Z5189 Encounter for other specified aftercare: Secondary | ICD-10-CM

## 2015-04-22 DIAGNOSIS — G620 Drug-induced polyneuropathy: Secondary | ICD-10-CM | POA: Diagnosis not present

## 2015-04-22 DIAGNOSIS — Z5111 Encounter for antineoplastic chemotherapy: Secondary | ICD-10-CM

## 2015-04-22 DIAGNOSIS — C8230 Follicular lymphoma grade IIIa, unspecified site: Secondary | ICD-10-CM

## 2015-04-22 DIAGNOSIS — L089 Local infection of the skin and subcutaneous tissue, unspecified: Secondary | ICD-10-CM

## 2015-04-22 LAB — COMPREHENSIVE METABOLIC PANEL (CC13)
ALT: 16 U/L (ref 0–55)
AST: 19 U/L (ref 5–34)
Albumin: 3.7 g/dL (ref 3.5–5.0)
Alkaline Phosphatase: 81 U/L (ref 40–150)
Anion Gap: 9 mEq/L (ref 3–11)
BUN: 18.9 mg/dL (ref 7.0–26.0)
CO2: 26 mEq/L (ref 22–29)
Calcium: 9.7 mg/dL (ref 8.4–10.4)
Chloride: 106 mEq/L (ref 98–109)
Creatinine: 0.9 mg/dL (ref 0.6–1.1)
EGFR: 62 mL/min/{1.73_m2} — ABNORMAL LOW (ref >90)
Glucose: 90 mg/dl (ref 70–140)
Potassium: 4 mEq/L (ref 3.5–5.1)
Sodium: 141 mEq/L (ref 136–145)
Total Bilirubin: 0.53 mg/dL (ref 0.20–1.20)
Total Protein: 6.3 g/dL — ABNORMAL LOW (ref 6.4–8.3)

## 2015-04-22 LAB — CBC WITH DIFFERENTIAL/PLATELET
BASO%: 1.2 % (ref 0.0–2.0)
Basophils Absolute: 0.1 10*3/uL (ref 0.0–0.1)
EOS%: 2.9 % (ref 0.0–7.0)
Eosinophils Absolute: 0.2 10*3/uL (ref 0.0–0.5)
HCT: 36 % (ref 34.8–46.6)
HGB: 12.1 g/dL (ref 11.6–15.9)
LYMPH%: 10.9 % — ABNORMAL LOW (ref 14.0–49.7)
MCH: 30.5 pg (ref 25.1–34.0)
MCHC: 33.5 g/dL (ref 31.5–36.0)
MCV: 91 fL (ref 79.5–101.0)
MONO#: 0.9 10*3/uL (ref 0.1–0.9)
MONO%: 16.5 % — ABNORMAL HIGH (ref 0.0–14.0)
NEUT#: 3.9 10*3/uL (ref 1.5–6.5)
NEUT%: 68.5 % (ref 38.4–76.8)
Platelets: 325 10*3/uL (ref 145–400)
RBC: 3.95 10*6/uL (ref 3.70–5.45)
RDW: 20 % — ABNORMAL HIGH (ref 11.2–14.5)
WBC: 5.6 10*3/uL (ref 3.9–10.3)
lymph#: 0.6 10*3/uL — ABNORMAL LOW (ref 0.9–3.3)

## 2015-04-22 MED ORDER — DIPHENHYDRAMINE HCL 25 MG PO CAPS
50.0000 mg | ORAL_CAPSULE | Freq: Once | ORAL | Status: AC
  Administered 2015-04-22: 50 mg via ORAL

## 2015-04-22 MED ORDER — PEGFILGRASTIM 6 MG/0.6ML ~~LOC~~ PSKT
6.0000 mg | PREFILLED_SYRINGE | Freq: Once | SUBCUTANEOUS | Status: AC
  Administered 2015-04-22: 6 mg via SUBCUTANEOUS
  Filled 2015-04-22: qty 0.6

## 2015-04-22 MED ORDER — VINCRISTINE SULFATE CHEMO INJECTION 1 MG/ML
1.0000 mg | Freq: Once | INTRAVENOUS | Status: AC
  Administered 2015-04-22: 1 mg via INTRAVENOUS
  Filled 2015-04-22: qty 1

## 2015-04-22 MED ORDER — ACETAMINOPHEN 325 MG PO TABS
ORAL_TABLET | ORAL | Status: AC
  Filled 2015-04-22: qty 2

## 2015-04-22 MED ORDER — SODIUM CHLORIDE 0.9 % IV SOLN
675.0000 mg/m2 | Freq: Once | INTRAVENOUS | Status: AC
  Administered 2015-04-22: 1080 mg via INTRAVENOUS
  Filled 2015-04-22: qty 54

## 2015-04-22 MED ORDER — SODIUM CHLORIDE 0.9 % IV SOLN
Freq: Once | INTRAVENOUS | Status: AC
  Administered 2015-04-22: 10:00:00 via INTRAVENOUS

## 2015-04-22 MED ORDER — DIPHENHYDRAMINE HCL 25 MG PO CAPS
ORAL_CAPSULE | ORAL | Status: AC
  Filled 2015-04-22: qty 2

## 2015-04-22 MED ORDER — ACETAMINOPHEN 325 MG PO TABS
650.0000 mg | ORAL_TABLET | Freq: Once | ORAL | Status: AC
  Administered 2015-04-22: 650 mg via ORAL

## 2015-04-22 MED ORDER — SULFAMETHOXAZOLE-TRIMETHOPRIM 800-160 MG PO TABS: 1.0000 | ORAL_TABLET | Freq: Two times a day (BID) | ORAL | Status: DC

## 2015-04-22 MED ORDER — SODIUM CHLORIDE 0.9 % IV SOLN
Freq: Once | INTRAVENOUS | Status: AC
  Administered 2015-04-22: 10:00:00 via INTRAVENOUS
  Filled 2015-04-22: qty 8

## 2015-04-22 MED ORDER — DOXORUBICIN HCL CHEMO IV INJECTION 2 MG/ML
45.0000 mg/m2 | Freq: Once | INTRAVENOUS | Status: AC
  Administered 2015-04-22: 72 mg via INTRAVENOUS
  Filled 2015-04-22: qty 36

## 2015-04-22 MED ORDER — SODIUM CHLORIDE 0.9 % IV SOLN
375.0000 mg/m2 | Freq: Once | INTRAVENOUS | Status: AC
  Administered 2015-04-22: 600 mg via INTRAVENOUS
  Filled 2015-04-22: qty 60

## 2015-04-22 NOTE — Assessment & Plan Note (Signed)
This is related to side effects of increase steam. I will reduce her dose of vincristine by 50% as above and observe for now.

## 2015-04-22 NOTE — Telephone Encounter (Signed)
Gave and printeda ppt sched and avs for pt for July and AUG °

## 2015-04-22 NOTE — Progress Notes (Signed)
Per phone call from [place], [place] with Dr. Alvy Bimler: Pt to be treated peripherally today. Do not use port.

## 2015-04-22 NOTE — Progress Notes (Signed)
[name] OFFICE PROGRESS NOTE  Patient Care Team: Arvella Nigh, MD as PCP - General (Obstetrics and Gynecology) Carola Frost, RN as Registered Nurse (Medical Oncology) [name] as Attending Physician (Internal Medicine)  SUMMARY OF ONCOLOGIC HISTORY:   Follicular lymphoma grade 3a   01/21/2015 Surgery She underwent excisional lymph node biopsy that came back follicular lymphoma grade 3   01/21/2015 Pathology Results Accession: [id number] biopsy confirmed follicular lymphoma   1/75/1025 Imaging Echocardiogram showed ejection fraction of 55-60%   02/11/2015 Imaging  PET CT scan show possible splenic involvement and diffuse lymphadenopathy throughout   02/11/2015 Bone Marrow Biopsy  bone marrow biopsy was performed and is involved by lymphoma with translocation of igH/BCL2   02/13/2015 Procedure She had port placement.   02/17/2015 -  Chemotherapy She received R-CHOP chemo   02/17/2015 Adverse Reaction She had mild infusion reaction with cycle 1 of treatment.   04/18/2015 Imaging PET CT scan showed near complete response to treatment.   04/22/2015 Adverse Reaction Vincristine dose was reduced by 50% due to neuropathy from cycle 4 onwards    INTERVAL HISTORY: Please see below for problem oriented charting. She is seen for further follow-up and prior to cycle 4 treatment. She complained of mild peripheral neuropathy in her feet. It is not severe. Her bone pain has resolved. She denies new lymphadenopathy. Denies mucositis, nausea or vomiting.  REVIEW OF SYSTEMS:   Constitutional: Denies fevers, chills or abnormal weight loss Eyes: Denies blurriness of vision Ears, nose, mouth, throat, and face: Denies mucositis or sore throat Respiratory: Denies cough, dyspnea or wheezes Cardiovascular: Denies palpitation, chest discomfort or lower extremity swelling Gastrointestinal:  Denies nausea, heartburn or change in bowel habits Skin: Denies abnormal skin rashes Lymphatics: Denies  new lymphadenopathy or easy bruising Neurological:Denies numbness, tingling or new weaknesses Behavioral/Psych: Mood is stable, no new changes  All other systems were reviewed with the patient and are negative.  I have reviewed the past medical history, past surgical history, social history and family history with the patient and they are unchanged from previous note.  ALLERGIES:  has No Known Allergies.  MEDICATIONS:  Current Outpatient Prescriptions  Medication Sig Dispense Refill  . acetaminophen (TYLENOL) 325 MG tablet Take 650 mg by mouth every 6 (six) hours as needed.    Marland Kitchen aspirin 81 MG tablet Take 81 mg by mouth daily.    Marland Kitchen levothyroxine (SYNTHROID, LEVOTHROID) 75 MCG tablet Take 100 mcg by mouth daily.     Marland Kitchen lidocaine-prilocaine (EMLA) cream Apply 1 application topically as needed. Apply to port a cath site one hour prior to needle stick 30 g 2  . loratadine (CLARITIN) 10 MG tablet Take 10 mg by mouth daily.    . Multiple Vitamins-Minerals (AIRBORNE GUMMIES PO) Take by mouth.    . ondansetron (ZOFRAN) 8 MG tablet Take 1 tablet (8 mg total) by mouth 2 (two) times daily. 30 tablet 1  . predniSONE (DELTASONE) 20 MG tablet Take 3 tabs on days 2-5 every cycle of 21 days 30 tablet 1  . prochlorperazine (COMPAZINE) 10 MG tablet Take 1 tablet (10 mg total) by mouth every 6 (six) hours as needed (Nausea or vomiting). 30 tablet 6  . triamcinolone ointment (KENALOG) 0.1 % Apply 1 application topically 2 (two) times daily as needed (Itching).    Marland Kitchen oxyCODONE-acetaminophen (ROXICET) 5-325 MG per tablet Take 1 tablet by mouth every 4 (four) hours as needed. (Patient not taking: Reported on 04/22/2015) 30 tablet 0  . sulfamethoxazole-trimethoprim (  BACTRIM DS,SEPTRA DS) 800-160 MG per tablet Take 1 tablet by mouth 2 (two) times daily. 14 tablet 0   No current facility-administered medications for this visit.   Facility-Administered Medications Ordered in Other Visits  Medication Dose Route Frequency  Provider Last Rate Last Dose  . cyclophosphamide (CYTOXAN) 1,080 mg in sodium chloride 0.9 % 250 mL chemo infusion  675 mg/m2 (Treatment Plan Actual) Intravenous Once [name], MD      . DOXOrubicin (ADRIAMYCIN) chemo injection 72 mg  45 mg/m2 (Treatment Plan Actual) Intravenous Once [name], MD      . ondansetron (ZOFRAN) 16 mg, dexamethasone (DECADRON) 20 mg in sodium chloride 0.9 % 50 mL IVPB   Intravenous Once Pauleen Goleman, MD      . pegfilgrastim (NEULASTA ONPRO KIT) injection 6 mg  6 mg Subcutaneous Once [name], MD      . riTUXimab (RITUXAN) 600 mg in sodium chloride 0.9 % 250 mL (1.9355 mg/mL) chemo infusion  375 mg/m2 (Treatment Plan Actual) Intravenous Once [name], MD      . vinCRIStine (ONCOVIN) 1 mg in sodium chloride 0.9 % 50 mL chemo infusion  1 mg Intravenous Once [name], MD        PHYSICAL EXAMINATION: ECOG PERFORMANCE STATUS: 1 - Symptomatic but completely ambulatory  Filed Vitals:   04/22/15 0859  BP: 133/87  Pulse: 88  Temp: 97.6 F (36.4 C)  Resp: 18   Filed Weights   04/22/15 0859  Weight: 136 lb (61.689 kg)    GENERAL:alert, no distress and comfortable SKIN: skin color, texture, turgor are normal, no rashes or significant lesions EYES: normal, Conjunctiva are pink and non-injected, sclera clear OROPHARYNX:no exudate, no erythema and lips, buccal mucosa, and tongue normal  NECK: supple, thyroid normal size, non-tender, without nodularity LYMPH:  no palpable lymphadenopathy in the cervical, axillary or inguinal LUNGS: clear to auscultation and percussion with normal breathing effort HEART: regular rate & rhythm and no murmurs and no lower extremity edema ABDOMEN:abdomen soft, non-tender and normal bowel sounds Musculoskeletal:no cyanosis of digits and no clubbing  NEURO: alert & oriented x 3 with fluent speech, no focal motor/sensory deficits  LABORATORY DATA:  I have reviewed the data as listed    Component Value Date/Time   NA 141  04/22/2015 0843   NA 142 01/16/2015 1130   K 4.0 04/22/2015 0843   K 4.0 01/16/2015 1130   CL 102 01/16/2015 1130   CO2 26 04/22/2015 0843   CO2 29 01/16/2015 1130   GLUCOSE 90 04/22/2015 0843   GLUCOSE 114* 01/16/2015 1130   BUN 18.9 04/22/2015 0843   BUN 19 01/16/2015 1130   CREATININE 0.9 04/22/2015 0843   CREATININE 1.03 01/16/2015 1130   CALCIUM 9.7 04/22/2015 0843   CALCIUM 9.5 01/16/2015 1130   PROT 6.3* 04/22/2015 0843   PROT 6.9 01/16/2015 1130   ALBUMIN 3.7 04/22/2015 0843   ALBUMIN 3.7 01/16/2015 1130   AST 19 04/22/2015 0843   AST 25 01/16/2015 1130   ALT 16 04/22/2015 0843   ALT 20 01/16/2015 1130   ALKPHOS 81 04/22/2015 0843   ALKPHOS 93 01/16/2015 1130   BILITOT 0.53 04/22/2015 0843   BILITOT 0.7 01/16/2015 1130   GFRNONAA 53* 01/16/2015 1130   GFRAA 61* 01/16/2015 1130    No results found for: SPEP, UPEP  Lab Results  Component Value Date   WBC 5.6 04/22/2015   NEUTROABS 3.9 04/22/2015   HGB 12.1 04/22/2015   HCT 36.0 04/22/2015  MCV 91.0 04/22/2015   PLT 325 04/22/2015      Chemistry      Component Value Date/Time   NA 141 04/22/2015 0843   NA 142 01/16/2015 1130   K 4.0 04/22/2015 0843   K 4.0 01/16/2015 1130   CL 102 01/16/2015 1130   CO2 26 04/22/2015 0843   CO2 29 01/16/2015 1130   BUN 18.9 04/22/2015 0843   BUN 19 01/16/2015 1130   CREATININE 0.9 04/22/2015 0843   CREATININE 1.03 01/16/2015 1130      Component Value Date/Time   CALCIUM 9.7 04/22/2015 0843   CALCIUM 9.5 01/16/2015 1130   ALKPHOS 81 04/22/2015 0843   ALKPHOS 93 01/16/2015 1130   AST 19 04/22/2015 0843   AST 25 01/16/2015 1130   ALT 16 04/22/2015 0843   ALT 20 01/16/2015 1130   BILITOT 0.53 04/22/2015 0843   BILITOT 0.7 01/16/2015 1130     I reviewed the PET CT scan with her and family ASSESSMENT & PLAN:  Follicular lymphoma grade 3a She has remarkable response to treatment. Due to mild peripheral neuropathy, I plan to reduce the dose of vincristine by  50%. Previously, she have some bone pain with Neulasta injection. I recommend she take Claritin and pain medicine as needed for 3 days after chemotherapy to avoid bone pain again. Plan would be to give total of 6 cycles of treatment  Neuropathy due to chemotherapeutic drug This is related to side effects of increase steam. I will reduce her dose of vincristine by 50% as above and observe for now.   No orders of the defined types were placed in this encounter.   All questions were answered. The patient knows to call the clinic with any problems, questions or concerns. No barriers to learning was detected. I spent 30 minutes counseling the patient face to face. The total time spent in the appointment was 40 minutes and more than 50% was on counseling and review of test results     [facility], Daleah Coulson, MD 04/22/2015 10:14 AM

## 2015-04-22 NOTE — Assessment & Plan Note (Signed)
She has remarkable response to treatment. Due to mild peripheral neuropathy, I plan to reduce the dose of vincristine by 50%. Previously, she have some bone pain with Neulasta injection. I recommend she take Claritin and pain medicine as needed for 3 days after chemotherapy to avoid bone pain again. Plan would be to give total of 6 cycles of treatment

## 2015-04-22 NOTE — Progress Notes (Signed)
Adriamycin infused over 12 minutes.  Blood return checked every 34 minutes. Blood return checked prior to vincristine.

## 2015-04-22 NOTE — Patient Instructions (Addendum)
[facility] Discharge Instructions for Patients Receiving Chemotherapy  Today you received the following chemotherapy agents: adriamycin, vincristine, cytoxan, rituxan  To help prevent nausea and vomiting after your treatment, we encourage you to take your nausea medication.  Take it as often as prescribed.     If you develop nausea and vomiting that is not controlled by your nausea medication, call the clinic. If it is after clinic hours your family physician or the after hours number for the clinic or go to the Emergency Department.   BELOW ARE SYMPTOMS THAT SHOULD BE REPORTED IMMEDIATELY:  *FEVER GREATER THAN 100.5 F  *CHILLS WITH OR WITHOUT FEVER  NAUSEA AND VOMITING THAT IS NOT CONTROLLED WITH YOUR NAUSEA MEDICATION  *UNUSUAL SHORTNESS OF BREATH  *UNUSUAL BRUISING OR BLEEDING  TENDERNESS IN MOUTH AND THROAT WITH OR WITHOUT PRESENCE OF ULCERS  *URINARY PROBLEMS  *BOWEL PROBLEMS  UNUSUAL RASH Items with * indicate a potential emergency and should be followed up as soon as possible.  Feel free to call the clinic you have any questions or concerns. The clinic phone number is [phone].   I have been informed and understand all the instructions given to me. I know to contact the clinic, my physician, or go to the Emergency Department if any problems should occur. I do not have any questions at this time, but understand that I may call the clinic during office hours   should I have any questions or need assistance in obtaining follow up care.    __________________________________________  _____________  __________ Signature of Patient or Authorized Representative            Date                   Time    __________________________________________ Nurse's Signature    

## 2015-05-12 ENCOUNTER — Ambulatory Visit (HOSPITAL_BASED_OUTPATIENT_CLINIC_OR_DEPARTMENT_OTHER): Payer: Medicare HMO

## 2015-05-12 ENCOUNTER — Ambulatory Visit (HOSPITAL_BASED_OUTPATIENT_CLINIC_OR_DEPARTMENT_OTHER): Payer: Medicare HMO | Admitting: Hematology and Oncology

## 2015-05-12 ENCOUNTER — Other Ambulatory Visit (HOSPITAL_BASED_OUTPATIENT_CLINIC_OR_DEPARTMENT_OTHER): Payer: Medicare HMO

## 2015-05-12 ENCOUNTER — Encounter: Payer: Self-pay | Admitting: Hematology and Oncology

## 2015-05-12 VITALS — BP 111/47 | HR 79 | Temp 97.7°F | Resp 18

## 2015-05-12 VITALS — BP 125/76 | HR 77 | Temp 97.9°F | Resp 18 | Ht 61.0 in | Wt 137.9 lb

## 2015-05-12 DIAGNOSIS — G620 Drug-induced polyneuropathy: Secondary | ICD-10-CM

## 2015-05-12 DIAGNOSIS — Z5189 Encounter for other specified aftercare: Secondary | ICD-10-CM

## 2015-05-12 DIAGNOSIS — Z5112 Encounter for antineoplastic immunotherapy: Secondary | ICD-10-CM

## 2015-05-12 DIAGNOSIS — L989 Disorder of the skin and subcutaneous tissue, unspecified: Secondary | ICD-10-CM

## 2015-05-12 DIAGNOSIS — C8230 Follicular lymphoma grade IIIa, unspecified site: Secondary | ICD-10-CM

## 2015-05-12 DIAGNOSIS — Z5111 Encounter for antineoplastic chemotherapy: Secondary | ICD-10-CM

## 2015-05-12 DIAGNOSIS — T451X5A Adverse effect of antineoplastic and immunosuppressive drugs, initial encounter: Secondary | ICD-10-CM

## 2015-05-12 LAB — CBC WITH DIFFERENTIAL/PLATELET
BASO%: 1.4 % (ref 0.0–2.0)
Basophils Absolute: 0.1 10*3/uL (ref 0.0–0.1)
EOS%: 3.4 % (ref 0.0–7.0)
Eosinophils Absolute: 0.2 10*3/uL (ref 0.0–0.5)
HCT: 36.7 % (ref 34.8–46.6)
HGB: 12.2 g/dL (ref 11.6–15.9)
LYMPH%: 13.5 % — ABNORMAL LOW (ref 14.0–49.7)
MCH: 31 pg (ref 25.1–34.0)
MCHC: 33.1 g/dL (ref 31.5–36.0)
MCV: 93.5 fL (ref 79.5–101.0)
MONO#: 0.6 10*3/uL (ref 0.1–0.9)
MONO%: 12.1 % (ref 0.0–14.0)
NEUT#: 3.5 10*3/uL (ref 1.5–6.5)
NEUT%: 69.6 % (ref 38.4–76.8)
Platelets: 249 10*3/uL (ref 145–400)
RBC: 3.93 10*6/uL (ref 3.70–5.45)
RDW: 19.5 % — ABNORMAL HIGH (ref 11.2–14.5)
WBC: 5 10*3/uL (ref 3.9–10.3)
lymph#: 0.7 10*3/uL — ABNORMAL LOW (ref 0.9–3.3)

## 2015-05-12 LAB — COMPREHENSIVE METABOLIC PANEL (CC13)
ALT: 11 U/L (ref 0–55)
AST: 16 U/L (ref 5–34)
Albumin: 3.8 g/dL (ref 3.5–5.0)
Alkaline Phosphatase: 81 U/L (ref 40–150)
Anion Gap: 11 mEq/L (ref 3–11)
BUN: 24.9 mg/dL (ref 7.0–26.0)
CO2: 26 mEq/L (ref 22–29)
Calcium: 9.3 mg/dL (ref 8.4–10.4)
Chloride: 107 mEq/L (ref 98–109)
Creatinine: 0.9 mg/dL (ref 0.6–1.1)
EGFR: 64 mL/min/{1.73_m2} — ABNORMAL LOW (ref >90)
Glucose: 121 mg/dl (ref 70–140)
Potassium: 4.5 mEq/L (ref 3.5–5.1)
Sodium: 144 mEq/L (ref 136–145)
Total Bilirubin: 0.47 mg/dL (ref 0.20–1.20)
Total Protein: 6.4 g/dL (ref 6.4–8.3)

## 2015-05-12 MED ORDER — SODIUM CHLORIDE 0.9 % IJ SOLN
10.0000 mL | INTRAMUSCULAR | Status: DC | PRN
  Administered 2015-05-12: 10 mL
  Filled 2015-05-12: qty 10

## 2015-05-12 MED ORDER — SODIUM CHLORIDE 0.9 % IV SOLN
Freq: Once | INTRAVENOUS | Status: AC
  Administered 2015-05-12: 10:00:00 via INTRAVENOUS

## 2015-05-12 MED ORDER — DOXORUBICIN HCL CHEMO IV INJECTION 2 MG/ML
45.0000 mg/m2 | Freq: Once | INTRAVENOUS | Status: AC
  Administered 2015-05-12: 72 mg via INTRAVENOUS
  Filled 2015-05-12: qty 36

## 2015-05-12 MED ORDER — DIPHENHYDRAMINE HCL 25 MG PO CAPS
50.0000 mg | ORAL_CAPSULE | Freq: Once | ORAL | Status: AC
  Administered 2015-05-12: 50 mg via ORAL

## 2015-05-12 MED ORDER — PEGFILGRASTIM 6 MG/0.6ML ~~LOC~~ PSKT
6.0000 mg | PREFILLED_SYRINGE | Freq: Once | SUBCUTANEOUS | Status: AC
  Administered 2015-05-12: 6 mg via SUBCUTANEOUS
  Filled 2015-05-12: qty 0.6

## 2015-05-12 MED ORDER — CYCLOPHOSPHAMIDE CHEMO INJECTION 1 GM
675.0000 mg/m2 | Freq: Once | INTRAMUSCULAR | Status: AC
  Administered 2015-05-12: 1080 mg via INTRAVENOUS
  Filled 2015-05-12: qty 54

## 2015-05-12 MED ORDER — SODIUM CHLORIDE 0.9 % IV SOLN
Freq: Once | INTRAVENOUS | Status: AC
  Administered 2015-05-12: 10:00:00 via INTRAVENOUS
  Filled 2015-05-12: qty 8

## 2015-05-12 MED ORDER — FLUCONAZOLE 100 MG PO TABS: 100.0000 mg | ORAL_TABLET | Freq: Every day | ORAL | Status: DC

## 2015-05-12 MED ORDER — VINCRISTINE SULFATE CHEMO INJECTION 1 MG/ML
1.0000 mg | Freq: Once | INTRAVENOUS | Status: AC
  Administered 2015-05-12: 1 mg via INTRAVENOUS
  Filled 2015-05-12: qty 1

## 2015-05-12 MED ORDER — HEPARIN SOD (PORK) LOCK FLUSH 100 UNIT/ML IV SOLN
500.0000 [IU] | Freq: Once | INTRAVENOUS | Status: AC | PRN
  Administered 2015-05-12: 500 [IU]
  Filled 2015-05-12: qty 5

## 2015-05-12 MED ORDER — ACETAMINOPHEN 325 MG PO TABS
650.0000 mg | ORAL_TABLET | Freq: Once | ORAL | Status: AC
  Administered 2015-05-12: 650 mg via ORAL

## 2015-05-12 MED ORDER — SODIUM CHLORIDE 0.9 % IV SOLN
375.0000 mg/m2 | Freq: Once | INTRAVENOUS | Status: AC
  Administered 2015-05-12: 600 mg via INTRAVENOUS
  Filled 2015-05-12: qty 60

## 2015-05-12 MED ORDER — ACETAMINOPHEN 325 MG PO TABS
ORAL_TABLET | ORAL | Status: AC
  Filled 2015-05-12: qty 2

## 2015-05-12 MED ORDER — DIPHENHYDRAMINE HCL 25 MG PO CAPS
ORAL_CAPSULE | ORAL | Status: AC
  Filled 2015-05-12: qty 2

## 2015-05-12 NOTE — Patient Instructions (Signed)
[facility] Discharge Instructions for Patients Receiving Chemotherapy  Today you received the following chemotherapy agents: adriamycin, vincristine, cytoxan, rituxan  To help prevent nausea and vomiting after your treatment, we encourage you to take your nausea medication.  Take it as often as prescribed.     If you develop nausea and vomiting that is not controlled by your nausea medication, call the clinic. If it is after clinic hours your family physician or the after hours number for the clinic or go to the Emergency Department.   BELOW ARE SYMPTOMS THAT SHOULD BE REPORTED IMMEDIATELY:  *FEVER GREATER THAN 100.5 F  *CHILLS WITH OR WITHOUT FEVER  NAUSEA AND VOMITING THAT IS NOT CONTROLLED WITH YOUR NAUSEA MEDICATION  *UNUSUAL SHORTNESS OF BREATH  *UNUSUAL BRUISING OR BLEEDING  TENDERNESS IN MOUTH AND THROAT WITH OR WITHOUT PRESENCE OF ULCERS  *URINARY PROBLEMS  *BOWEL PROBLEMS  UNUSUAL RASH Items with * indicate a potential emergency and should be followed up as soon as possible.  Feel free to call the clinic you have any questions or concerns. The clinic phone number is [phone].   I have been informed and understand all the instructions given to me. I know to contact the clinic, my physician, or go to the Emergency Department if any problems should occur. I do not have any questions at this time, but understand that I may call the clinic during office hours   should I have any questions or need assistance in obtaining follow up care.    __________________________________________  _____________  __________ Signature of Patient or Authorized Representative            Date                   Time    __________________________________________ Nurse's Signature    

## 2015-05-12 NOTE — Assessment & Plan Note (Signed)
She has remarkable response to treatment. Due to mild peripheral neuropathy, I plan to reduce the dose of vincristine by 50%. Previously, she have some bone pain with Neulasta injection. I recommend she take Claritin and pain medicine as needed for 3 days after chemotherapy to avoid bone pain again. Plan would be to give total of 6 cycles of treatment

## 2015-05-12 NOTE — Assessment & Plan Note (Signed)
This is related to side effects of vincristine I will reduce her dose of vincristine by 50% as above and observe for now.

## 2015-05-12 NOTE — Progress Notes (Signed)
[facility] OFFICE PROGRESS NOTE  Patient Care Team: Arvella Nigh, MD as PCP - General (Obstetrics and Gynecology) Carola Frost, RN as Registered Nurse (Medical Oncology) [name] as Attending Physician (Internal Medicine)  SUMMARY OF ONCOLOGIC HISTORY:   Follicular lymphoma grade 3a   01/21/2015 Surgery She underwent excisional lymph node biopsy that came back follicular lymphoma grade 3   01/21/2015 Pathology Results Accession: [id number] biopsy confirmed follicular lymphoma   6/54/6503 Imaging Echocardiogram showed ejection fraction of 55-60%   02/11/2015 Imaging  PET CT scan show possible splenic involvement and diffuse lymphadenopathy throughout   02/11/2015 Bone Marrow Biopsy  bone marrow biopsy was performed and is involved by lymphoma with translocation of igH/BCL2   02/13/2015 Procedure She had port placement.   02/17/2015 -  Chemotherapy She received R-CHOP chemo   02/17/2015 Adverse Reaction She had mild infusion reaction with cycle 1 of treatment.   04/18/2015 Imaging PET CT scan showed near complete response to treatment.   04/22/2015 Adverse Reaction Vincristine dose was reduced by 50% due to neuropathy from cycle 4 onwards    INTERVAL HISTORY: Please see below for problem oriented charting. She returns prior to cycle 5 of treatment. Her family has questions regarding last PET scan. She complained of skin lesion behind her left ear and some lesion in her labia area. Her neuropathy remained about the same. Denies recent infection.  REVIEW OF SYSTEMS:   Constitutional: Denies fevers, chills or abnormal weight loss Eyes: Denies blurriness of vision Ears, nose, mouth, throat, and face: Denies mucositis or sore throat Respiratory: Denies cough, dyspnea or wheezes Cardiovascular: Denies palpitation, chest discomfort or lower extremity swelling Gastrointestinal:  Denies nausea, heartburn or change in bowel habits Lymphatics: Denies new lymphadenopathy or easy  bruising Neurological:Denies numbness, tingling or new weaknesses Behavioral/Psych: Mood is stable, no new changes  All other systems were reviewed with the patient and are negative.  I have reviewed the past medical history, past surgical history, social history and family history with the patient and they are unchanged from previous note.  ALLERGIES:  has No Known Allergies.  MEDICATIONS:  Current Outpatient Prescriptions  Medication Sig Dispense Refill  . acetaminophen (TYLENOL) 325 MG tablet Take 650 mg by mouth every 6 (six) hours as needed.    Marland Kitchen aspirin 81 MG tablet Take 81 mg by mouth daily.    Marland Kitchen loratadine (CLARITIN) 10 MG tablet Take 10 mg by mouth daily.    . Multiple Vitamins-Minerals (AIRBORNE GUMMIES PO) Take by mouth.    . ondansetron (ZOFRAN) 8 MG tablet Take 1 tablet (8 mg total) by mouth 2 (two) times daily. 30 tablet 1  . predniSONE (DELTASONE) 20 MG tablet Take 3 tabs on days 2-5 every cycle of 21 days 30 tablet 1  . prochlorperazine (COMPAZINE) 10 MG tablet Take 1 tablet (10 mg total) by mouth every 6 (six) hours as needed (Nausea or vomiting). 30 tablet 6  . triamcinolone ointment (KENALOG) 0.1 % Apply 1 application topically 2 (two) times daily as needed (Itching).    . fluconazole (DIFLUCAN) 100 MG tablet Take 1 tablet (100 mg total) by mouth daily. 7 tablet 0  . levothyroxine (SYNTHROID, LEVOTHROID) 100 MCG tablet Take 100 mcg by mouth daily.  9  . lidocaine-prilocaine (EMLA) cream Apply 1 application topically as needed. Apply to port a cath site one hour prior to needle stick (Patient not taking: Reported on 05/12/2015) 30 g 2  . oxyCODONE-acetaminophen (ROXICET) 5-325 MG per tablet Take 1  tablet by mouth every 4 (four) hours as needed. (Patient not taking: Reported on 04/22/2015) 30 tablet 0   No current facility-administered medications for this visit.   Facility-Administered Medications Ordered in Other Visits  Medication Dose Route Frequency Provider Last Rate  Last Dose  . cyclophosphamide (CYTOXAN) 1,080 mg in sodium chloride 0.9 % 250 mL chemo infusion  675 mg/m2 (Treatment Plan Actual) Intravenous Once [name], MD      . DOXOrubicin (ADRIAMYCIN) chemo injection 72 mg  45 mg/m2 (Treatment Plan Actual) Intravenous Once [name], MD      . heparin lock flush 100 unit/mL  500 Units Intracatheter Once PRN [name], MD      . pegfilgrastim (NEULASTA ONPRO KIT) injection 6 mg  6 mg Subcutaneous Once [name], MD      . riTUXimab (RITUXAN) 600 mg in sodium chloride 0.9 % 250 mL (1.9355 mg/mL) chemo infusion  375 mg/m2 (Treatment Plan Actual) Intravenous Once [name], MD      . sodium chloride 0.9 % injection 10 mL  10 mL Intracatheter PRN Gray Doering, MD      . vinCRIStine (ONCOVIN) 1 mg in sodium chloride 0.9 % 50 mL chemo infusion  1 mg Intravenous Once [name], MD        PHYSICAL EXAMINATION: ECOG PERFORMANCE STATUS: 1 - Symptomatic but completely ambulatory  Filed Vitals:   05/12/15 0831  BP: 125/76  Pulse: 77  Temp: 97.9 F (36.6 C)  Resp: 18   Filed Weights   05/12/15 0831  Weight: 137 lb 14.4 oz (62.551 kg)    GENERAL:alert, no distress and comfortable SKIN: skin color, texture, turgor are normal, no rashes or significant lesions. She has seborrheic keratosis on her scalp EYES: normal, Conjunctiva are pink and non-injected, sclera clear OROPHARYNX:no exudate, no erythema and lips, buccal mucosa, and tongue normal  NECK: supple, thyroid normal size, non-tender, without nodularity LYMPH:  no palpable lymphadenopathy in the cervical, axillary or inguinal LUNGS: clear to auscultation and percussion with normal breathing effort HEART: regular rate & rhythm and no murmurs and no lower extremity edema ABDOMEN:abdomen soft, non-tender and normal bowel sounds Musculoskeletal:no cyanosis of digits and no clubbing  NEURO: alert & oriented x 3 with fluent speech, no focal motor/sensory deficits I examined her labia area and does  not see any signs of yeast infection LABORATORY DATA:  I have reviewed the data as listed    Component Value Date/Time   NA 144 05/12/2015 0819   NA 142 01/16/2015 1130   K 4.5 05/12/2015 0819   K 4.0 01/16/2015 1130   CL 102 01/16/2015 1130   CO2 26 05/12/2015 0819   CO2 29 01/16/2015 1130   GLUCOSE 121 05/12/2015 0819   GLUCOSE 114* 01/16/2015 1130   BUN 24.9 05/12/2015 0819   BUN 19 01/16/2015 1130   CREATININE 0.9 05/12/2015 0819   CREATININE 1.03 01/16/2015 1130   CALCIUM 9.3 05/12/2015 0819   CALCIUM 9.5 01/16/2015 1130   PROT 6.4 05/12/2015 0819   PROT 6.9 01/16/2015 1130   ALBUMIN 3.8 05/12/2015 0819   ALBUMIN 3.7 01/16/2015 1130   AST 16 05/12/2015 0819   AST 25 01/16/2015 1130   ALT 11 05/12/2015 0819   ALT 20 01/16/2015 1130   ALKPHOS 81 05/12/2015 0819   ALKPHOS 93 01/16/2015 1130   BILITOT 0.47 05/12/2015 0819   BILITOT 0.7 01/16/2015 1130   GFRNONAA 53* 01/16/2015 1130   GFRAA 61* 01/16/2015 1130    No results  found for: SPEP, UPEP  Lab Results  Component Value Date   WBC 5.0 05/12/2015   NEUTROABS 3.5 05/12/2015   HGB 12.2 05/12/2015   HCT 36.7 05/12/2015   MCV 93.5 05/12/2015   PLT 249 05/12/2015      Chemistry      Component Value Date/Time   NA 144 05/12/2015 0819   NA 142 01/16/2015 1130   K 4.5 05/12/2015 0819   K 4.0 01/16/2015 1130   CL 102 01/16/2015 1130   CO2 26 05/12/2015 0819   CO2 29 01/16/2015 1130   BUN 24.9 05/12/2015 0819   BUN 19 01/16/2015 1130   CREATININE 0.9 05/12/2015 0819   CREATININE 1.03 01/16/2015 1130      Component Value Date/Time   CALCIUM 9.3 05/12/2015 0819   CALCIUM 9.5 01/16/2015 1130   ALKPHOS 81 05/12/2015 0819   ALKPHOS 93 01/16/2015 1130   AST 16 05/12/2015 0819   AST 25 01/16/2015 1130   ALT 11 05/12/2015 0819   ALT 20 01/16/2015 1130   BILITOT 0.47 05/12/2015 0819   BILITOT 0.7 01/16/2015 1130       RADIOGRAPHIC STUDIES:I reviewed the PET/CT T scan with her and her daughter I have  personally reviewed the radiological images as listed and agreed with the findings in the report.   ASSESSMENT & PLAN:  Follicular lymphoma grade 3a She has remarkable response to treatment. Due to mild peripheral neuropathy, I plan to reduce the dose of vincristine by 50%. Previously, she have some bone pain with Neulasta injection. I recommend she take Claritin and pain medicine as needed for 3 days after chemotherapy to avoid bone pain again. Plan would be to give total of 6 cycles of treatment  Neuropathy due to chemotherapeutic drug This is related to side effects of vincristine I will reduce her dose of vincristine by 50% as above and observe for now.    Skin lesion of scalp She has skin lesion on the scalp which resembled solar keratosis.  She also had mild lesions at the labial minora that I suspect is because of dryness. It does not look like yeast infection. However, she would be predisposed to get vaginal yeast infection and I gave her prescription for fluconazole to hang onto.   No orders of the defined types were placed in this encounter.   All questions were answered. The patient knows to call the clinic with any problems, questions or concerns. No barriers to learning was detected. I spent 30 minutes counseling the patient face to face. The total time spent in the appointment was 40 minutes and more than 50% was on counseling and review of test results     [facility], [place], MD 05/12/2015 10:55 AM

## 2015-05-12 NOTE — Assessment & Plan Note (Signed)
She has skin lesion on the scalp which resembled solar keratosis.  She also had mild lesions at the labial minora that I suspect is because of dryness. It does not look like yeast infection. However, she would be predisposed to get vaginal yeast infection and I gave her prescription for fluconazole to hang onto.

## 2015-05-19 ENCOUNTER — Telehealth: Payer: Self-pay

## 2015-05-19 ENCOUNTER — Telehealth: Payer: Self-pay | Admitting: *Deleted

## 2015-05-19 NOTE — Telephone Encounter (Signed)
SPOKE TO CINDY AT [facility]. SHE WILL FAX THE PRIOR AUTHORIZATION TO RAQUEL BROWNING IN MANAGED CARE. NOTIFIED PT. OF THE ABOVE INFORMATION ON HER HOME VOICE MAIL.

## 2015-05-19 NOTE — Telephone Encounter (Signed)
Carrie [name] left a message stating that she needed a medication prior authorized.  Carrie [name] did not leave the name of the medication in the message.  Requested that she call back and leave the name and spelling of the medication if she has to leave a message.

## 2015-05-20 ENCOUNTER — Other Ambulatory Visit: Payer: Self-pay | Admitting: *Deleted

## 2015-05-20 ENCOUNTER — Other Ambulatory Visit: Payer: Self-pay | Admitting: Hematology and Oncology

## 2015-05-20 DIAGNOSIS — C8230 Follicular lymphoma grade IIIa, unspecified site: Secondary | ICD-10-CM

## 2015-05-20 MED ORDER — PREDNISONE 20 MG PO TABS: ORAL_TABLET | ORAL | Status: DC

## 2015-05-20 NOTE — Telephone Encounter (Signed)
Received message from [facility] states pt either needs a P.A. for prednisone refill or needs a new Rx w/ ICD-10 code on it faxed to them, then they can try to bill the prednisone through pt's Medicare part B.  Faxed new Rx prednisone w/ ICD-10 code C82.3 to [facility] on [address].  #[phone].   Pt only needs one more round of prednisone for last cycle of chemo on 06/02/15.

## 2015-06-02 ENCOUNTER — Ambulatory Visit (HOSPITAL_BASED_OUTPATIENT_CLINIC_OR_DEPARTMENT_OTHER): Payer: Medicare HMO

## 2015-06-02 ENCOUNTER — Other Ambulatory Visit (HOSPITAL_BASED_OUTPATIENT_CLINIC_OR_DEPARTMENT_OTHER): Payer: Medicare HMO

## 2015-06-02 ENCOUNTER — Ambulatory Visit (HOSPITAL_BASED_OUTPATIENT_CLINIC_OR_DEPARTMENT_OTHER): Payer: Medicare Other | Admitting: Hematology and Oncology

## 2015-06-02 ENCOUNTER — Telehealth: Payer: Self-pay | Admitting: Hematology and Oncology

## 2015-06-02 ENCOUNTER — Encounter: Payer: Self-pay | Admitting: Hematology and Oncology

## 2015-06-02 ENCOUNTER — Ambulatory Visit: Payer: Medicare HMO

## 2015-06-02 VITALS — BP 128/80 | HR 98 | Temp 97.6°F | Resp 18 | Ht 60.0 in | Wt 138.3 lb

## 2015-06-02 VITALS — BP 118/62 | HR 81 | Temp 98.7°F | Resp 18

## 2015-06-02 DIAGNOSIS — Z006 Encounter for examination for normal comparison and control in clinical research program: Secondary | ICD-10-CM

## 2015-06-02 DIAGNOSIS — C8290 Follicular lymphoma, unspecified, unspecified site: Secondary | ICD-10-CM

## 2015-06-02 DIAGNOSIS — Z5112 Encounter for antineoplastic immunotherapy: Secondary | ICD-10-CM

## 2015-06-02 DIAGNOSIS — G620 Drug-induced polyneuropathy: Secondary | ICD-10-CM

## 2015-06-02 DIAGNOSIS — Z5189 Encounter for other specified aftercare: Secondary | ICD-10-CM | POA: Diagnosis not present

## 2015-06-02 DIAGNOSIS — C8230 Follicular lymphoma grade IIIa, unspecified site: Secondary | ICD-10-CM | POA: Diagnosis not present

## 2015-06-02 DIAGNOSIS — T451X5A Adverse effect of antineoplastic and immunosuppressive drugs, initial encounter: Secondary | ICD-10-CM

## 2015-06-02 LAB — CBC WITH DIFFERENTIAL/PLATELET
BASO%: 1.2 % (ref 0.0–2.0)
Basophils Absolute: 0.1 10*3/uL (ref 0.0–0.1)
EOS%: 2.1 % (ref 0.0–7.0)
Eosinophils Absolute: 0.1 10*3/uL (ref 0.0–0.5)
HCT: 35 % (ref 34.8–46.6)
HGB: 11.9 g/dL (ref 11.6–15.9)
LYMPH%: 9.2 % — ABNORMAL LOW (ref 14.0–49.7)
MCH: 31.8 pg (ref 25.1–34.0)
MCHC: 33.8 g/dL (ref 31.5–36.0)
MCV: 94.1 fL (ref 79.5–101.0)
MONO#: 0.8 10*3/uL (ref 0.1–0.9)
MONO%: 13.8 % (ref 0.0–14.0)
NEUT#: 4.1 10*3/uL (ref 1.5–6.5)
NEUT%: 73.7 % (ref 38.4–76.8)
Platelets: 309 10*3/uL (ref 145–400)
RBC: 3.73 10*6/uL (ref 3.70–5.45)
RDW: 16.9 % — ABNORMAL HIGH (ref 11.2–14.5)
WBC: 5.5 10*3/uL (ref 3.9–10.3)
lymph#: 0.5 10*3/uL — ABNORMAL LOW (ref 0.9–3.3)

## 2015-06-02 LAB — COMPREHENSIVE METABOLIC PANEL (CC13)
ALT: 13 U/L (ref 0–55)
AST: 16 U/L (ref 5–34)
Albumin: 3.6 g/dL (ref 3.5–5.0)
Alkaline Phosphatase: 78 U/L (ref 40–150)
Anion Gap: 5 mEq/L (ref 3–11)
BUN: 24.6 mg/dL (ref 7.0–26.0)
CO2: 26 mEq/L (ref 22–29)
Calcium: 9.1 mg/dL (ref 8.4–10.4)
Chloride: 109 mEq/L (ref 98–109)
Creatinine: 0.8 mg/dL (ref 0.6–1.1)
EGFR: 73 mL/min/{1.73_m2} — ABNORMAL LOW (ref >90)
Glucose: 104 mg/dl (ref 70–140)
Potassium: 4 mEq/L (ref 3.5–5.1)
Sodium: 140 mEq/L (ref 136–145)
Total Bilirubin: 0.37 mg/dL (ref 0.20–1.20)
Total Protein: 6.3 g/dL — ABNORMAL LOW (ref 6.4–8.3)

## 2015-06-02 MED ORDER — VINCRISTINE SULFATE CHEMO INJECTION 1 MG/ML
1.0000 mg | Freq: Once | INTRAVENOUS | Status: AC
  Administered 2015-06-02: 1 mg via INTRAVENOUS
  Filled 2015-06-02: qty 1

## 2015-06-02 MED ORDER — SODIUM CHLORIDE 0.9 % IV SOLN
Freq: Once | INTRAVENOUS | Status: AC
  Administered 2015-06-02: 11:00:00 via INTRAVENOUS
  Filled 2015-06-02: qty 8

## 2015-06-02 MED ORDER — ACETAMINOPHEN 325 MG PO TABS
650.0000 mg | ORAL_TABLET | Freq: Once | ORAL | Status: AC
  Administered 2015-06-02: 650 mg via ORAL

## 2015-06-02 MED ORDER — HEPARIN SOD (PORK) LOCK FLUSH 100 UNIT/ML IV SOLN
500.0000 [IU] | Freq: Once | INTRAVENOUS | Status: DC
  Filled 2015-06-02: qty 5

## 2015-06-02 MED ORDER — DIPHENHYDRAMINE HCL 25 MG PO CAPS
ORAL_CAPSULE | ORAL | Status: AC
  Filled 2015-06-02: qty 2

## 2015-06-02 MED ORDER — SODIUM CHLORIDE 0.9 % IV SOLN
Freq: Once | INTRAVENOUS | Status: AC
  Administered 2015-06-02: 10:00:00 via INTRAVENOUS

## 2015-06-02 MED ORDER — SODIUM CHLORIDE 0.9 % IJ SOLN
10.0000 mL | INTRAMUSCULAR | Status: DC | PRN
  Administered 2015-06-02: 10 mL via INTRAVENOUS
  Filled 2015-06-02: qty 10

## 2015-06-02 MED ORDER — ACETAMINOPHEN 325 MG PO TABS
ORAL_TABLET | ORAL | Status: AC
  Filled 2015-06-02: qty 2

## 2015-06-02 MED ORDER — SODIUM CHLORIDE 0.9 % IV SOLN
675.0000 mg/m2 | Freq: Once | INTRAVENOUS | Status: AC
  Administered 2015-06-02: 1080 mg via INTRAVENOUS
  Filled 2015-06-02: qty 54

## 2015-06-02 MED ORDER — PEGFILGRASTIM 6 MG/0.6ML ~~LOC~~ PSKT
6.0000 mg | PREFILLED_SYRINGE | Freq: Once | SUBCUTANEOUS | Status: AC
  Administered 2015-06-02: 6 mg via SUBCUTANEOUS
  Filled 2015-06-02: qty 0.6

## 2015-06-02 MED ORDER — HEPARIN SOD (PORK) LOCK FLUSH 100 UNIT/ML IV SOLN
500.0000 [IU] | Freq: Once | INTRAVENOUS | Status: AC | PRN
  Administered 2015-06-02: 500 [IU]
  Filled 2015-06-02: qty 5

## 2015-06-02 MED ORDER — DIPHENHYDRAMINE HCL 25 MG PO CAPS
50.0000 mg | ORAL_CAPSULE | Freq: Once | ORAL | Status: AC
  Administered 2015-06-02: 50 mg via ORAL

## 2015-06-02 MED ORDER — SODIUM CHLORIDE 0.9 % IJ SOLN
10.0000 mL | INTRAMUSCULAR | Status: DC | PRN
  Administered 2015-06-02: 10 mL
  Filled 2015-06-02: qty 10

## 2015-06-02 MED ORDER — SODIUM CHLORIDE 0.9 % IV SOLN
375.0000 mg/m2 | Freq: Once | INTRAVENOUS | Status: AC
  Administered 2015-06-02: 600 mg via INTRAVENOUS
  Filled 2015-06-02: qty 60

## 2015-06-02 MED ORDER — DOXORUBICIN HCL CHEMO IV INJECTION 2 MG/ML
45.0000 mg/m2 | Freq: Once | INTRAVENOUS | Status: AC
  Administered 2015-06-02: 72 mg via INTRAVENOUS
  Filled 2015-06-02: qty 36

## 2015-06-02 NOTE — Assessment & Plan Note (Signed)
This is related to side effects of vincristine I will reduce her dose of vincristine by 50% as above and observe for now.

## 2015-06-02 NOTE — Telephone Encounter (Signed)
Gave patient avs report and appointments for September. [facility] will contact patient re pet scan 2-4 weeks prior to expected date - patient aware. Duration for 9/26  Scheduled for 2.5 hrs per 8/15 pof.

## 2015-06-02 NOTE — Progress Notes (Signed)
[place] [facility] [place] OFFICE PROGRESS NOTE  Patient Care Team: Arvella Nigh, MD as PCP - General (Obstetrics and Gynecology) Carola Frost, RN as Registered Nurse (Medical Oncology) [name] as Attending Physician (Internal Medicine)  SUMMARY OF ONCOLOGIC HISTORY:   Follicular lymphoma grade 3a   01/21/2015 Surgery She underwent excisional lymph node biopsy that came back follicular lymphoma grade 3   01/21/2015 Pathology Results Accession: [id number] biopsy confirmed follicular lymphoma   7/67/3419 Imaging Echocardiogram showed ejection fraction of 55-60%   02/11/2015 Imaging  PET CT scan show possible splenic involvement and diffuse lymphadenopathy throughout   02/11/2015 Bone Marrow Biopsy  bone marrow biopsy was performed and is involved by lymphoma with translocation of igH/BCL2   02/13/2015 Procedure She had port placement.   02/17/2015 -  Chemotherapy She received R-CHOP chemo   02/17/2015 Adverse Reaction She had mild infusion reaction with cycle 1 of treatment.   04/18/2015 Imaging PET CT scan showed near complete response to treatment.   04/22/2015 Adverse Reaction Vincristine dose was reduced by 50% due to neuropathy from cycle 4 onwards    INTERVAL HISTORY: Please see below for problem oriented charting. She returns to be seen prior to cycle 6 of treatment. She denies side effects from recent infusion. Denies mouth sores, nausea or vomiting. No recent infection. She continues a very mild peripheral neuropathy but it does not bother her. REVIEW OF SYSTEMS:   Constitutional: Denies fevers, chills or abnormal weight loss Eyes: Denies blurriness of vision Ears, nose, mouth, throat, and face: Denies mucositis or sore throat Respiratory: Denies cough, dyspnea or wheezes Cardiovascular: Denies palpitation, chest discomfort or lower extremity swelling Gastrointestinal:  Denies nausea, heartburn or change in bowel habits Skin: Denies abnormal skin rashes Lymphatics: Denies new  lymphadenopathy or easy bruising Neurological:Denies numbness, tingling or new weaknesses Behavioral/Psych: Mood is stable, no new changes  All other systems were reviewed with the patient and are negative.  I have reviewed the past medical history, past surgical history, social history and family history with the patient and they are unchanged from previous note.  ALLERGIES:  has No Known Allergies.  MEDICATIONS:  Current Outpatient Prescriptions  Medication Sig Dispense Refill  . acetaminophen (TYLENOL) 325 MG tablet Take 650 mg by mouth every 6 (six) hours as needed.    Marland Kitchen aspirin 81 MG tablet Take 81 mg by mouth daily.    Marland Kitchen levothyroxine (SYNTHROID, LEVOTHROID) 100 MCG tablet Take 100 mcg by mouth daily.  9  . lidocaine-prilocaine (EMLA) cream Apply 1 application topically as needed. Apply to port a cath site one hour prior to needle stick 30 g 2  . loratadine (CLARITIN) 10 MG tablet Take 10 mg by mouth daily.    . Multiple Vitamins-Minerals (AIRBORNE GUMMIES PO) Take by mouth.    . ondansetron (ZOFRAN) 8 MG tablet Take 1 tablet (8 mg total) by mouth 2 (two) times daily. 30 tablet 1  . predniSONE (DELTASONE) 20 MG tablet Take 3 tabs on days 2-5 every cycle of 21 days 12 tablet 1  . prochlorperazine (COMPAZINE) 10 MG tablet Take 1 tablet (10 mg total) by mouth every 6 (six) hours as needed (Nausea or vomiting). 30 tablet 6  . triamcinolone ointment (KENALOG) 0.1 % Apply 1 application topically 2 (two) times daily as needed (Itching).     No current facility-administered medications for this visit.   Facility-Administered Medications Ordered in Other Visits  Medication Dose Route Frequency Provider Last Rate Last Dose  . heparin lock flush  100 unit/mL  500 Units Intravenous Once [name], MD      . sodium chloride 0.9 % injection 10 mL  10 mL Intravenous PRN [name], MD   10 mL at 06/02/15 0927    PHYSICAL EXAMINATION: ECOG PERFORMANCE STATUS: 0 - Asymptomatic  Filed Vitals:    06/02/15 0918  BP: 128/80  Pulse: 98  Temp: 97.6 F (36.4 C)  Resp: 18   Filed Weights   06/02/15 0918  Weight: 138 lb 4.8 oz (62.732 kg)    GENERAL:alert, no distress and comfortable SKIN: skin color, texture, turgor are normal, no rashes or significant lesions EYES: normal, Conjunctiva are pink and non-injected, sclera clear OROPHARYNX:no exudate, no erythema and lips, buccal mucosa, and tongue normal  NECK: supple, thyroid normal size, non-tender, without nodularity LYMPH:  no palpable lymphadenopathy in the cervical, axillary or inguinal LUNGS: clear to auscultation and percussion with normal breathing effort HEART: regular rate & rhythm and no murmurs and no lower extremity edema ABDOMEN:abdomen soft, non-tender and normal bowel sounds Musculoskeletal:no cyanosis of digits and no clubbing  NEURO: alert & oriented x 3 with fluent speech, no focal motor/sensory deficits  LABORATORY DATA:  I have reviewed the data as listed    Component Value Date/Time   NA 144 05/12/2015 0819   NA 142 01/16/2015 1130   K 4.5 05/12/2015 0819   K 4.0 01/16/2015 1130   CL 102 01/16/2015 1130   CO2 26 05/12/2015 0819   CO2 29 01/16/2015 1130   GLUCOSE 121 05/12/2015 0819   GLUCOSE 114* 01/16/2015 1130   BUN 24.9 05/12/2015 0819   BUN 19 01/16/2015 1130   CREATININE 0.9 05/12/2015 0819   CREATININE 1.03 01/16/2015 1130   CALCIUM 9.3 05/12/2015 0819   CALCIUM 9.5 01/16/2015 1130   PROT 6.4 05/12/2015 0819   PROT 6.9 01/16/2015 1130   ALBUMIN 3.8 05/12/2015 0819   ALBUMIN 3.7 01/16/2015 1130   AST 16 05/12/2015 0819   AST 25 01/16/2015 1130   ALT 11 05/12/2015 0819   ALT 20 01/16/2015 1130   ALKPHOS 81 05/12/2015 0819   ALKPHOS 93 01/16/2015 1130   BILITOT 0.47 05/12/2015 0819   BILITOT 0.7 01/16/2015 1130   GFRNONAA 53* 01/16/2015 1130   GFRAA 61* 01/16/2015 1130    No results found for: SPEP, UPEP  Lab Results  Component Value Date   WBC 5.5 06/02/2015   NEUTROABS 4.1  06/02/2015   HGB 11.9 06/02/2015   HCT 35.0 06/02/2015   MCV 94.1 06/02/2015   PLT 309 06/02/2015      Chemistry      Component Value Date/Time   NA 144 05/12/2015 0819   NA 142 01/16/2015 1130   K 4.5 05/12/2015 0819   K 4.0 01/16/2015 1130   CL 102 01/16/2015 1130   CO2 26 05/12/2015 0819   CO2 29 01/16/2015 1130   BUN 24.9 05/12/2015 0819   BUN 19 01/16/2015 1130   CREATININE 0.9 05/12/2015 0819   CREATININE 1.03 01/16/2015 1130      Component Value Date/Time   CALCIUM 9.3 05/12/2015 0819   CALCIUM 9.5 01/16/2015 1130   ALKPHOS 81 05/12/2015 0819   ALKPHOS 93 01/16/2015 1130   AST 16 05/12/2015 0819   AST 25 01/16/2015 1130   ALT 11 05/12/2015 0819   ALT 20 01/16/2015 1130   BILITOT 0.47 05/12/2015 0819   BILITOT 0.7 01/16/2015 1130     ASSESSMENT & PLAN:  Follicular lymphoma grade 3a She has remarkable  response to treatment. Due to mild peripheral neuropathy, I plan to reduce the dose of vincristine by 50%. Previously, she have some bone pain with Neulasta injection. I recommend she take Claritin and pain medicine as needed for 3 days after chemotherapy to avoid bone pain again. Plan would be to give total of 6 cycles of treatment After 2 days last treatment, I will schedule PET CT scan for review next month. If she have complete response to treatment, we will proceed with maintenance rituximab every other month for total of 2 years.  Neuropathy due to chemotherapeutic drug This is related to side effects of vincristine I will reduce her dose of vincristine by 50% as above and observe for now.    Orders Placed This Encounter  Procedures  . NM PET Image Restag (PS) Skull Base To Thigh    Standing Status: Future     Number of Occurrences:      Standing Expiration Date: 08/01/2016    Order Specific Question:  Reason for Exam (SYMPTOM  OR DIAGNOSIS REQUIRED)    Answer:  lymphoma, assess response to Rx    Order Specific Question:  Preferred imaging location?     Answer:  [facility]   All questions were answered. The patient knows to call the clinic with any problems, questions or concerns. No barriers to learning was detected. I spent 25 minutes counseling the patient face to face. The total time spent in the appointment was 30 minutes and more than 50% was on counseling and review of test results     [facility], [place], MD 06/02/2015 9:48 AM

## 2015-06-02 NOTE — Assessment & Plan Note (Signed)
She has remarkable response to treatment. Due to mild peripheral neuropathy, I plan to reduce the dose of vincristine by 50%. Previously, she have some bone pain with Neulasta injection. I recommend she take Claritin and pain medicine as needed for 3 days after chemotherapy to avoid bone pain again. Plan would be to give total of 6 cycles of treatment After 2 days last treatment, I will schedule PET CT scan for review next month. If she have complete response to treatment, we will proceed with maintenance rituximab every other month for total of 2 years.

## 2015-06-02 NOTE — Patient Instructions (Signed)
[facility] Discharge Instructions for Patients Receiving Chemotherapy  Today you received the following chemotherapy agents : Adriamycin, Cytoxan, Vincristine, Rituxan.  To help prevent nausea and vomiting after your treatment, we encourage you to take your nausea medication.  If you develop nausea and vomiting that is not controlled by your nausea medication, call the clinic.   BELOW ARE SYMPTOMS THAT SHOULD BE REPORTED IMMEDIATELY:  *FEVER GREATER THAN 100.5 F  *CHILLS WITH OR WITHOUT FEVER  NAUSEA AND VOMITING THAT IS NOT CONTROLLED WITH YOUR NAUSEA MEDICATION  *UNUSUAL SHORTNESS OF BREATH  *UNUSUAL BRUISING OR BLEEDING  TENDERNESS IN MOUTH AND THROAT WITH OR WITHOUT PRESENCE OF ULCERS  *URINARY PROBLEMS  *BOWEL PROBLEMS  UNUSUAL RASH Items with * indicate a potential emergency and should be followed up as soon as possible.  Feel free to call the clinic you have any questions or concerns. The clinic phone number is [phone].  Please show the [place] at check-in to the Emergency Department and triage nurse.

## 2015-06-02 NOTE — Patient Instructions (Signed)

## 2015-06-04 ENCOUNTER — Other Ambulatory Visit: Payer: Self-pay | Admitting: Hematology and Oncology

## 2015-06-16 ENCOUNTER — Telehealth: Payer: Self-pay | Admitting: *Deleted

## 2015-06-16 NOTE — Telephone Encounter (Signed)
"  Please tell my doctor and nurse I had a rough weekend with diarrhea.  I feel better now.  Thanks."  Called [phone] to talk with patient.  Reports she had "diarrhea Saturday, Sunday every hour and a half with stomach cramps that has resolved.  Last bm today at 1:00 pm with total of three stools today.  Drinking fluids, eating soups and stomach feels a lot better now."  Discussed use of imodium and to call tomorrow with update if symptoms do not continue to improve.

## 2015-06-17 ENCOUNTER — Telehealth: Payer: Self-pay | Admitting: *Deleted

## 2015-06-17 NOTE — Telephone Encounter (Signed)
Pt left VM states she was sick over the weekend but is feeling much better today.  She is eating and drinking today.  She wanted Dr. Alvy Bimler to know she is feeling better.

## 2015-06-18 ENCOUNTER — Ambulatory Visit (HOSPITAL_BASED_OUTPATIENT_CLINIC_OR_DEPARTMENT_OTHER): Payer: Medicare HMO

## 2015-06-18 ENCOUNTER — Other Ambulatory Visit: Payer: Self-pay | Admitting: Hematology and Oncology

## 2015-06-18 ENCOUNTER — Telehealth: Payer: Self-pay | Admitting: *Deleted

## 2015-06-18 ENCOUNTER — Encounter: Payer: Self-pay | Admitting: Hematology and Oncology

## 2015-06-18 ENCOUNTER — Telehealth: Payer: Self-pay | Admitting: Hematology and Oncology

## 2015-06-18 ENCOUNTER — Ambulatory Visit: Payer: Medicare Other

## 2015-06-18 ENCOUNTER — Ambulatory Visit (HOSPITAL_BASED_OUTPATIENT_CLINIC_OR_DEPARTMENT_OTHER): Payer: Medicare HMO | Admitting: Hematology and Oncology

## 2015-06-18 ENCOUNTER — Other Ambulatory Visit: Payer: Self-pay | Admitting: *Deleted

## 2015-06-18 ENCOUNTER — Ambulatory Visit: Payer: Medicare HMO

## 2015-06-18 VITALS — BP 125/65 | HR 125 | Temp 98.7°F | Resp 18

## 2015-06-18 DIAGNOSIS — C8230 Follicular lymphoma grade IIIa, unspecified site: Secondary | ICD-10-CM

## 2015-06-18 DIAGNOSIS — E860 Dehydration: Secondary | ICD-10-CM | POA: Insufficient documentation

## 2015-06-18 DIAGNOSIS — T451X5A Adverse effect of antineoplastic and immunosuppressive drugs, initial encounter: Secondary | ICD-10-CM

## 2015-06-18 DIAGNOSIS — Z95828 Presence of other vascular implants and grafts: Secondary | ICD-10-CM

## 2015-06-18 DIAGNOSIS — D72829 Elevated white blood cell count, unspecified: Secondary | ICD-10-CM | POA: Diagnosis not present

## 2015-06-18 DIAGNOSIS — D6481 Anemia due to antineoplastic chemotherapy: Secondary | ICD-10-CM | POA: Diagnosis not present

## 2015-06-18 DIAGNOSIS — Z5189 Encounter for other specified aftercare: Secondary | ICD-10-CM | POA: Diagnosis not present

## 2015-06-18 LAB — COMPREHENSIVE METABOLIC PANEL (CC13)
ALT: 14 U/L (ref 0–55)
AST: 21 U/L (ref 5–34)
Albumin: 3.2 g/dL — ABNORMAL LOW (ref 3.5–5.0)
Alkaline Phosphatase: 108 U/L (ref 40–150)
Anion Gap: 9 mEq/L (ref 3–11)
BUN: 24 mg/dL (ref 7.0–26.0)
CO2: 26 mEq/L (ref 22–29)
Calcium: 9.1 mg/dL (ref 8.4–10.4)
Chloride: 102 mEq/L (ref 98–109)
Creatinine: 1.1 mg/dL (ref 0.6–1.1)
EGFR: 53 mL/min/{1.73_m2} — ABNORMAL LOW (ref >90)
Glucose: 141 mg/dl — ABNORMAL HIGH (ref 70–140)
Potassium: 3.8 mEq/L (ref 3.5–5.1)
Sodium: 136 mEq/L (ref 136–145)
Total Bilirubin: 0.72 mg/dL (ref 0.20–1.20)
Total Protein: 6.6 g/dL (ref 6.4–8.3)

## 2015-06-18 LAB — CBC WITH DIFFERENTIAL/PLATELET
BASO%: 0.4 % (ref 0.0–2.0)
Basophils Absolute: 0.1 10*3/uL (ref 0.0–0.1)
EOS%: 0.1 % (ref 0.0–7.0)
Eosinophils Absolute: 0 10*3/uL (ref 0.0–0.5)
HCT: 31.7 % — ABNORMAL LOW (ref 34.8–46.6)
HGB: 10.4 g/dL — ABNORMAL LOW (ref 11.6–15.9)
LYMPH%: 1.2 % — ABNORMAL LOW (ref 14.0–49.7)
MCH: 31.1 pg (ref 25.1–34.0)
MCHC: 33 g/dL (ref 31.5–36.0)
MCV: 94.3 fL (ref 79.5–101.0)
MONO#: 1.5 10*3/uL — ABNORMAL HIGH (ref 0.1–0.9)
MONO%: 9.2 % (ref 0.0–14.0)
NEUT#: 14.5 10*3/uL — ABNORMAL HIGH (ref 1.5–6.5)
NEUT%: 89.1 % — ABNORMAL HIGH (ref 38.4–76.8)
Platelets: 206 10*3/uL (ref 145–400)
RBC: 3.36 10*6/uL — ABNORMAL LOW (ref 3.70–5.45)
RDW: 14 % (ref 11.2–14.5)
WBC: 16.2 10*3/uL — ABNORMAL HIGH (ref 3.9–10.3)
lymph#: 0.2 10*3/uL — ABNORMAL LOW (ref 0.9–3.3)

## 2015-06-18 LAB — MAGNESIUM (CC13): Magnesium: 2.1 mg/dl (ref 1.5–2.5)

## 2015-06-18 MED ORDER — SODIUM CHLORIDE 0.9 % IJ SOLN
10.0000 mL | INTRAMUSCULAR | Status: DC | PRN
  Administered 2015-06-18: 10 mL via INTRAVENOUS
  Filled 2015-06-18: qty 10

## 2015-06-18 MED ORDER — HEPARIN SOD (PORK) LOCK FLUSH 100 UNIT/ML IV SOLN
500.0000 [IU] | Freq: Once | INTRAVENOUS | Status: AC | PRN
  Administered 2015-06-18: 500 [IU]
  Filled 2015-06-18: qty 5

## 2015-06-18 MED ORDER — SODIUM CHLORIDE 0.9 % IJ SOLN
10.0000 mL | INTRAMUSCULAR | Status: DC | PRN
  Administered 2015-06-18: 10 mL
  Filled 2015-06-18: qty 10

## 2015-06-18 MED ORDER — SODIUM CHLORIDE 0.9 % IV SOLN
Freq: Once | INTRAVENOUS | Status: AC
  Administered 2015-06-18: 14:00:00 via INTRAVENOUS
  Filled 2015-06-18: qty 4

## 2015-06-18 MED ORDER — SODIUM CHLORIDE 0.9 % IV SOLN
1000.0000 mL | Freq: Once | INTRAVENOUS | Status: AC
Start: 2015-06-18 — End: 2015-06-18
  Administered 2015-06-18: 1000 mL via INTRAVENOUS

## 2015-06-18 NOTE — Patient Instructions (Addendum)
Dehydration, Adult Dehydration means your body does not have as much fluid as it needs. Your kidneys, brain, and heart will not work properly without the right amount of fluids and salt.  HOME CARE  Ask your doctor how to replace body fluid losses (rehydrate).  Drink enough fluids to keep your pee (urine) clear or pale yellow.  Drink small amounts of fluids often if you feel sick to your stomach (nauseous) or throw up (vomit).  Eat like you normally do.  Avoid:  Foods or drinks high in sugar.  Bubbly (carbonated) drinks.  Juice.  Very hot or cold fluids.  Drinks with caffeine.  Fatty, greasy foods.  Alcohol.  Tobacco.  Eating too much.  Gelatin desserts.  Wash your hands to avoid spreading germs (bacteria, viruses).  Only take medicine as told by your doctor.  Keep all doctor visits as told. GET HELP RIGHT AWAY IF:   You cannot drink something without throwing up.  You get worse even with treatment.  Your vomit has blood in it or looks greenish.  Your poop (stool) has blood in it or looks black and tarry.  You have not peed in 6 to 8 hours.  You pee a small amount of very dark pee.  You have a fever.  You pass out (faint).  You have belly (abdominal) pain that gets worse or stays in one spot (localizes).  You have a rash, stiff neck, or bad headache.  You get easily annoyed, sleepy, or are hard to wake up.  You feel weak, dizzy, or very thirsty. MAKE SURE YOU:   Understand these instructions.  Will watch your condition.  Will get help right away if you are not doing well or get worse. Document Released: 07/31/2009 Document Revised: 12/27/2011 Document Reviewed: 05/24/2011 [facility] Patient Information 2015 [place], [place]. This information is not intended to replace advice given to you by your health care provider. Make sure you discuss any questions you have with your health care provider.   Scheduled for IVFs tomorrow (9/1), 9/2 and 9/3.   Call office if feeling better and need to cancel appointments.

## 2015-06-18 NOTE — Assessment & Plan Note (Signed)
This is likely due to recent treatment. The patient denies recent history of bleeding such as epistaxis, hematuria or hematochezia. She is asymptomatic from the anemia. I will observe for now.   

## 2015-06-18 NOTE — Assessment & Plan Note (Signed)
She has no signs of disease recurrence. Unfortunately, she developed significant complication from recent treatment with profound dehydration and diarrhea I recommend aggressive IV fluid resuscitation over the next few days and she agreed.

## 2015-06-18 NOTE — Progress Notes (Signed)
Patient states that she has not had any incidents of diarrhea today, only a few soft stools.  Patient states that she had one incidence of nausea this morning around 9 am and she took Compazine 10 mg PO.  States that she presently not having any nausea.  Patient reports that she does not have any pain, and has not had any incidents of shortness of breath.  Patient arrived to infusion room in wheelchair accompanied by a close friend.  Patient scheduled for IVFs tomorrow, 9/1, 9/2, and 9/3.  Patient told to call office if she is feeling better and would like to cancel appointments.  Patient verbalized understanding.   Post vital signs stable, patient ambulates and states that she is not experiencing any nausea, shortness of breath or dizziness at discharge.

## 2015-06-18 NOTE — Telephone Encounter (Addendum)
Patient called reporting she "Feels weak and wiped out after the diarrhea experience.  Had at least ten stools daily over the weekend with stomach cramps.  I am wiped out and need IV fluids.  Last diarrhea was Sunday morning at 0500."  Return number [phone].  Denies abdominal distention or cramping today.

## 2015-06-18 NOTE — Telephone Encounter (Signed)
Instructed pt to come in at 12:15 pm.  She verbalized understanding.

## 2015-06-18 NOTE — Assessment & Plan Note (Signed)
This is related to recent G-CSF injection. She has no clinical signs to suggest sepsis or infection. We will observe for now

## 2015-06-18 NOTE — Assessment & Plan Note (Signed)
She has significant dehydration from recent diarrhea. Fortunately, she did not develop any signs of acute renal failure. She felt better with IV fluids and I recommend we give her daily hydration over the next few days.

## 2015-06-18 NOTE — Telephone Encounter (Signed)
PLaced POF to see today

## 2015-06-18 NOTE — Telephone Encounter (Signed)
Spoke with patient and she is aware of her appointments and a message was sent to cameo for 9/1 [place] for ivf

## 2015-06-18 NOTE — Progress Notes (Signed)
[facility] OFFICE PROGRESS NOTE  Patient Care Team: Arvella Nigh, MD as PCP - General (Obstetrics and Gynecology) Carola Frost, RN as Registered Nurse (Medical Oncology) [name] as Attending Physician (Internal Medicine)  SUMMARY OF ONCOLOGIC HISTORY:   Follicular lymphoma grade 3a   01/21/2015 Surgery She underwent excisional lymph node biopsy that came back follicular lymphoma grade 3   01/21/2015 Pathology Results Accession: [id number] biopsy confirmed follicular lymphoma   04/08/2978 Imaging Echocardiogram showed ejection fraction of 55-60%   02/11/2015 Imaging  PET CT scan show possible splenic involvement and diffuse lymphadenopathy throughout   02/11/2015 Bone Marrow Biopsy  bone marrow biopsy was performed and is involved by lymphoma with translocation of igH/BCL2   02/13/2015 Procedure She had port placement.   02/17/2015 -  Chemotherapy She received R-CHOP chemo   02/17/2015 Adverse Reaction She had mild infusion reaction with cycle 1 of treatment.   04/18/2015 Imaging PET CT scan showed near complete response to treatment.   04/22/2015 Adverse Reaction Vincristine dose was reduced by 50% due to neuropathy from cycle 4 onwards    INTERVAL HISTORY: Please see below for problem oriented charting.  she is seen urgently today because of complication from her last cycle of treatment. Over the weekend, she developed significant diarrhea which subsequently resolved. This morning, she had profound dizziness and mild nausea. She felt weak  she denies fevers or chills. She has lost 5 pounds of weight over the last few days. REVIEW OF SYSTEMS:   Constitutional: Denies fevers, chills  Eyes: Denies blurriness of vision Ears, nose, mouth, throat, and face: Denies mucositis or sore throat Respiratory: Denies cough, dyspnea or wheezes Cardiovascular: Denies palpitation, chest discomfort or lower extremity swelling Skin: Denies abnormal skin rashes Lymphatics: Denies new  lymphadenopathy or easy bruising Neurological:Denies numbness, tingling  Behavioral/Psych: Mood is stable, no new changes  All other systems were reviewed with the patient and are negative.  I have reviewed the past medical history, past surgical history, social history and family history with the patient and they are unchanged from previous note.  ALLERGIES:  has No Known Allergies.  MEDICATIONS:  Current Outpatient Prescriptions  Medication Sig Dispense Refill  . acetaminophen (TYLENOL) 325 MG tablet Take 650 mg by mouth every 6 (six) hours as needed.    Marland Kitchen aspirin 81 MG tablet Take 81 mg by mouth daily.    Marland Kitchen levothyroxine (SYNTHROID, LEVOTHROID) 100 MCG tablet Take 100 mcg by mouth daily.  9  . lidocaine-prilocaine (EMLA) cream Apply 1 application topically as needed. Apply to port a cath site one hour prior to needle stick 30 g 2  . loratadine (CLARITIN) 10 MG tablet Take 10 mg by mouth daily.    . Multiple Vitamins-Minerals (AIRBORNE GUMMIES PO) Take by mouth.    . predniSONE (DELTASONE) 20 MG tablet Take 3 tabs on days 2-5 every cycle of 21 days 12 tablet 1  . triamcinolone ointment (KENALOG) 0.1 % Apply 1 application topically 2 (two) times daily as needed (Itching).     No current facility-administered medications for this visit.   Facility-Administered Medications Ordered in Other Visits  Medication Dose Route Frequency Provider Last Rate Last Dose  . sodium chloride 0.9 % injection 10 mL  10 mL Intracatheter PRN [name], MD   10 mL at 06/18/15 1540    PHYSICAL EXAMINATION: ECOG PERFORMANCE STATUS: 2 - Symptomatic, <50% confined to bed  Filed Vitals:   There were no vitals filed for this visit.  GENERAL:alert,  no distress and comfortable.  She looked ill SKIN: skin color, texture, turgor are normal, no rashes or significant lesions EYES: normal, Conjunctiva are pink and non-injected, sclera clear OROPHARYNX:no exudate, no erythema and lips, buccal mucosa, and tongue  normal . Dry mucous membrane is noted NECK: supple, thyroid normal size, non-tender, without nodularity Musculoskeletal:no cyanosis of digits and no clubbing  NEURO: alert & oriented x 3 with fluent speech, no focal motor/sensory deficits  LABORATORY DATA:  I have reviewed the data as listed    Component Value Date/Time   NA 136 06/18/2015 1236   NA 142 01/16/2015 1130   K 3.8 06/18/2015 1236   K 4.0 01/16/2015 1130   CL 102 01/16/2015 1130   CO2 26 06/18/2015 1236   CO2 29 01/16/2015 1130   GLUCOSE 141* 06/18/2015 1236   GLUCOSE 114* 01/16/2015 1130   BUN 24.0 06/18/2015 1236   BUN 19 01/16/2015 1130   CREATININE 1.1 06/18/2015 1236   CREATININE 1.03 01/16/2015 1130   CALCIUM 9.1 06/18/2015 1236   CALCIUM 9.5 01/16/2015 1130   PROT 6.6 06/18/2015 1236   PROT 6.9 01/16/2015 1130   ALBUMIN 3.2* 06/18/2015 1236   ALBUMIN 3.7 01/16/2015 1130   AST 21 06/18/2015 1236   AST 25 01/16/2015 1130   ALT 14 06/18/2015 1236   ALT 20 01/16/2015 1130   ALKPHOS 108 06/18/2015 1236   ALKPHOS 93 01/16/2015 1130   BILITOT 0.72 06/18/2015 1236   BILITOT 0.7 01/16/2015 1130   GFRNONAA 53* 01/16/2015 1130   GFRAA 61* 01/16/2015 1130    No results found for: SPEP, UPEP  Lab Results  Component Value Date   WBC 16.2* 06/18/2015   NEUTROABS 14.5* 06/18/2015   HGB 10.4* 06/18/2015   HCT 31.7* 06/18/2015   MCV 94.3 06/18/2015   PLT 206 06/18/2015      Chemistry      Component Value Date/Time   NA 136 06/18/2015 1236   NA 142 01/16/2015 1130   K 3.8 06/18/2015 1236   K 4.0 01/16/2015 1130   CL 102 01/16/2015 1130   CO2 26 06/18/2015 1236   CO2 29 01/16/2015 1130   BUN 24.0 06/18/2015 1236   BUN 19 01/16/2015 1130   CREATININE 1.1 06/18/2015 1236   CREATININE 1.03 01/16/2015 1130      Component Value Date/Time   CALCIUM 9.1 06/18/2015 1236   CALCIUM 9.5 01/16/2015 1130   ALKPHOS 108 06/18/2015 1236   ALKPHOS 93 01/16/2015 1130   AST 21 06/18/2015 1236   AST 25 01/16/2015  1130   ALT 14 06/18/2015 1236   ALT 20 01/16/2015 1130   BILITOT 0.72 06/18/2015 1236   BILITOT 0.7 01/16/2015 1130     ASSESSMENT & PLAN:  Follicular lymphoma grade 3a She has no signs of disease recurrence. Unfortunately, she developed significant complication from recent treatment with profound dehydration and diarrhea I recommend aggressive IV fluid resuscitation over the next few days and she agreed.  Dehydration  She has significant dehydration from recent diarrhea. Fortunately, she did not develop any signs of acute renal failure. She felt better with IV fluids and I recommend we give her daily hydration over the next few days.  Anemia due to antineoplastic chemotherapy This is likely due to recent treatment. The patient denies recent history of bleeding such as epistaxis, hematuria or hematochezia. She is asymptomatic from the anemia. I will observe for now.   Leukocytosis  This is related to recent G-CSF injection. She has no clinical  signs to suggest sepsis or infection. We will observe for now      All questions were answered. The patient knows to call the clinic with any problems, questions or concerns. No barriers to learning was detected. I spent 20 minutes counseling the patient face to face. The total time spent in the appointment was 30 minutes and more than 50% was on counseling and review of test results     [facility], [place], MD 06/18/2015 4:53 PM

## 2015-06-19 ENCOUNTER — Other Ambulatory Visit: Payer: Self-pay | Admitting: *Deleted

## 2015-06-19 ENCOUNTER — Ambulatory Visit (HOSPITAL_COMMUNITY)
Admission: RE | Admit: 2015-06-19 | Discharge: 2015-06-19 | Disposition: A | Discharge status: Home / Self Care | Payer: Medicare HMO | Source: Ambulatory Visit | Attending: Hematology and Oncology | Admitting: Hematology and Oncology

## 2015-06-19 ENCOUNTER — Ambulatory Visit: Payer: Medicare Other | Admitting: Hematology and Oncology

## 2015-06-19 ENCOUNTER — Telehealth: Payer: Self-pay | Admitting: *Deleted

## 2015-06-19 ENCOUNTER — Ambulatory Visit: Payer: Medicare HMO | Admitting: Hematology and Oncology

## 2015-06-19 DIAGNOSIS — E860 Dehydration: Secondary | ICD-10-CM | POA: Insufficient documentation

## 2015-06-19 DIAGNOSIS — C8230 Follicular lymphoma grade IIIa, unspecified site: Secondary | ICD-10-CM | POA: Insufficient documentation

## 2015-06-19 MED ORDER — SODIUM CHLORIDE 0.9 % IV SOLN
Freq: Once | INTRAVENOUS | Status: AC
  Administered 2015-06-19: 12:00:00 via INTRAVENOUS

## 2015-06-19 MED ORDER — SODIUM CHLORIDE 0.9 % IJ SOLN
10.0000 mL | Freq: Once | INTRAMUSCULAR | Status: AC
  Administered 2015-06-19: 10 mL via INTRAVENOUS

## 2015-06-19 MED ORDER — SODIUM CHLORIDE 0.9 % IV SOLN
8.0000 mg | Freq: Every day | INTRAVENOUS | Status: DC | PRN
  Administered 2015-06-19: 8 mg via INTRAVENOUS
  Filled 2015-06-19 (×4): qty 4

## 2015-06-19 MED ORDER — HEPARIN SOD (PORK) LOCK FLUSH 100 UNIT/ML IV SOLN
500.0000 [IU] | Freq: Once | INTRAVENOUS | Status: AC
  Administered 2015-06-19: 500 [IU] via INTRAVENOUS
  Filled 2015-06-19: qty 5

## 2015-06-19 NOTE — Telephone Encounter (Signed)
I called pt back again,  She had not listened to my message.   I informed her Dr. Alvy Bimler can see her this morning and may possibly admit her but needs to see her first.   She will need to come early.  Pt says she cannot get here this morning.  She has a ride to [facility] at 11:30 but cannot get here any sooner.   I asked pt if she is feeling worse than yesterday.  She says she just has no energy and it is difficult for her to get ready and get here every day for IVFs.  She thinks it would  Be easier if she can stay in the hospital for a few days for IVFs than having to travel back and forth.   She denies any fevers, or any n/v/d today.  Offered to pt she may feel a little better after her fluids today.  She is scheduled for IVFs again here tomorrow.  Please call us first thing in the morning if she still not feeling better by tomorrow morning.  We can add her on to see Dr. Alvy Bimler tomorrow again if needed.   If pt feels worse later today or tonight she can go to ED.  Pt verbalized understanding.

## 2015-06-19 NOTE — Telephone Encounter (Signed)
Pt left VM states she is "struggling this morning" and has "no energy."  She asks if Dr. Alvy Bimler thinks she should go in the hospital?   I called pt back and left VM informing Dr. Alvy Bimler will see her in office this morning and to come in as soon she can.  Dr. Alvy Bimler is only in clinic til 11 am today.    Asked pt to please call back to confirm.

## 2015-06-19 NOTE — Procedures (Signed)
[facility]  Procedure Note  Carrie [name] HOO:[id number] DOB: [birth year] DOA: 06/19/2015   PCP: Darlyn Chamber, MD   Associated Diagnosis: Follicular lymphoma grade 3a (202.00)  Procedure Note: IV infusion of normal saline; IV infusion of zofran   Condition During Procedure: Pt tolerated well   Condition at Discharge:  No complications noted   Nigel Sloop, [facility]

## 2015-06-20 ENCOUNTER — Telehealth: Payer: Self-pay | Admitting: *Deleted

## 2015-06-20 ENCOUNTER — Other Ambulatory Visit: Payer: Self-pay | Admitting: Hematology and Oncology

## 2015-06-20 ENCOUNTER — Encounter: Payer: Self-pay | Admitting: Hematology and Oncology

## 2015-06-20 ENCOUNTER — Ambulatory Visit (HOSPITAL_BASED_OUTPATIENT_CLINIC_OR_DEPARTMENT_OTHER): Payer: Medicare HMO

## 2015-06-20 ENCOUNTER — Telehealth: Payer: Self-pay | Admitting: Hematology and Oncology

## 2015-06-20 VITALS — BP 138/85 | HR 100 | Temp 99.2°F

## 2015-06-20 DIAGNOSIS — C8230 Follicular lymphoma grade IIIa, unspecified site: Secondary | ICD-10-CM | POA: Diagnosis not present

## 2015-06-20 DIAGNOSIS — R110 Nausea: Secondary | ICD-10-CM | POA: Diagnosis not present

## 2015-06-20 DIAGNOSIS — E860 Dehydration: Secondary | ICD-10-CM

## 2015-06-20 HISTORY — DX: Nausea: R11.0

## 2015-06-20 MED ORDER — SODIUM CHLORIDE 0.9 % IV SOLN
Freq: Once | INTRAVENOUS | Status: AC
  Administered 2015-06-20: 13:00:00 via INTRAVENOUS
  Filled 2015-06-20: qty 4

## 2015-06-20 MED ORDER — SODIUM CHLORIDE 0.9 % IV SOLN
INTRAVENOUS | Status: DC
  Administered 2015-06-20: 15:00:00 via INTRAVENOUS

## 2015-06-20 MED ORDER — SODIUM CHLORIDE 0.9 % IJ SOLN
10.0000 mL | INTRAMUSCULAR | Status: DC | PRN
  Administered 2015-06-20: 10 mL
  Filled 2015-06-20: qty 10

## 2015-06-20 MED ORDER — SODIUM CHLORIDE 0.9 % IV SOLN
Freq: Once | INTRAVENOUS | Status: AC
  Administered 2015-06-20: 13:00:00 via INTRAVENOUS

## 2015-06-20 MED ORDER — ONDANSETRON 8 MG PO TBDP
8.0000 mg | ORAL_TABLET | Freq: Three times a day (TID) | ORAL | Status: DC | PRN
Start: 2015-06-20 — End: 2015-11-03

## 2015-06-20 MED ORDER — HEPARIN SOD (PORK) LOCK FLUSH 100 UNIT/ML IV SOLN
500.0000 [IU] | Freq: Once | INTRAVENOUS | Status: AC | PRN
  Administered 2015-06-20: 500 [IU]
  Filled 2015-06-20: qty 5

## 2015-06-20 NOTE — Patient Instructions (Signed)

## 2015-06-20 NOTE — Telephone Encounter (Signed)
Pt states feeling a little better today,  Not any worse.  She does not think she needs to be hospitalized now.  She will come in for IVFs today and tomorrow as scheduled.  She will have her daughter w/ her today and says her daughter would like to talk to Dr. Alvy Bimler.  Pt asks if Dr. Alvy Bimler can come by and see her in Infusion today?   Her appt is at 12:30 pm.

## 2015-06-20 NOTE — Telephone Encounter (Signed)
I spoke with the patient briefly. I recommend she gets extra IV fluids today and tomorrow, total 2 L per day. I recommend she takes round-the-clock Compazine and I will prescribe additional Zofran as needed. I recommend the patient to call me next week if she remain nauseated I recommend she change to a [employer]

## 2015-06-20 NOTE — Telephone Encounter (Signed)
VOICE MAIL AT 8:40AM/ FORWARD CALL AT 9:36AM

## 2015-06-20 NOTE — Telephone Encounter (Signed)
I will stop by

## 2015-06-21 ENCOUNTER — Ambulatory Visit (HOSPITAL_BASED_OUTPATIENT_CLINIC_OR_DEPARTMENT_OTHER): Payer: Medicare HMO

## 2015-06-21 VITALS — BP 165/92 | HR 88 | Temp 98.0°F | Resp 18

## 2015-06-21 DIAGNOSIS — C8230 Follicular lymphoma grade IIIa, unspecified site: Secondary | ICD-10-CM

## 2015-06-21 DIAGNOSIS — R110 Nausea: Secondary | ICD-10-CM | POA: Diagnosis not present

## 2015-06-21 MED ORDER — SODIUM CHLORIDE 0.9 % IV SOLN
INTRAVENOUS | Status: DC
  Administered 2015-06-21: 09:00:00 via INTRAVENOUS

## 2015-06-21 MED ORDER — CALCIUM CARBONATE ANTACID 500 MG PO CHEW: 2.0000 | CHEWABLE_TABLET | Freq: Every day | ORAL | Status: DC

## 2015-06-21 MED ORDER — SODIUM CHLORIDE 0.9 % IV SOLN
Freq: Once | INTRAVENOUS | Status: AC
  Administered 2015-06-21: 11:00:00 via INTRAVENOUS

## 2015-06-21 MED ORDER — SODIUM CHLORIDE 0.9 % IV SOLN
Freq: Once | INTRAVENOUS | Status: DC
  Administered 2015-06-21: 10:00:00 via INTRAVENOUS

## 2015-06-21 MED ORDER — SODIUM CHLORIDE 0.9 % IJ SOLN
10.0000 mL | INTRAMUSCULAR | Status: DC | PRN
  Administered 2015-06-21: 10 mL
  Filled 2015-06-21: qty 10

## 2015-06-21 MED ORDER — SODIUM CHLORIDE 0.9 % IV SOLN
Freq: Once | INTRAVENOUS | Status: DC
  Filled 2015-06-21: qty 4

## 2015-06-21 MED ORDER — HEPARIN SOD (PORK) LOCK FLUSH 100 UNIT/ML IV SOLN
250.0000 [IU] | Freq: Once | INTRAVENOUS | Status: DC | PRN
  Filled 2015-06-21: qty 5

## 2015-06-21 MED ORDER — HEPARIN SOD (PORK) LOCK FLUSH 100 UNIT/ML IV SOLN
500.0000 [IU] | Freq: Once | INTRAVENOUS | Status: AC | PRN
  Administered 2015-06-21: 500 [IU]
  Filled 2015-06-21: qty 5

## 2015-06-21 NOTE — Progress Notes (Signed)
Pt reports heartburn . VSS Per Dr Marin Olp I gave her 2 tums.

## 2015-06-21 NOTE — Patient Instructions (Signed)
Dehydration, Adult Dehydration is when you lose more fluids from the body than you take in. Vital organs like the kidneys, brain, and heart cannot function without a proper amount of fluids and salt. Any loss of fluids from the body can cause dehydration.  CAUSES   Vomiting.  Diarrhea.  Excessive sweating.  Excessive urine output.  Fever. SYMPTOMS  Mild dehydration  Thirst.  Dry lips.  Slightly dry mouth. Moderate dehydration  Very dry mouth.  Sunken eyes.  Skin does not bounce back quickly when lightly pinched and released.  Dark urine and decreased urine production.  Decreased tear production.  Headache. Severe dehydration  Very dry mouth.  Extreme thirst.  Rapid, weak pulse (more than 100 beats per minute at rest).  Cold hands and feet.  Not able to sweat in spite of heat and temperature.  Rapid breathing.  Blue lips.  Confusion and lethargy.  Difficulty being awakened.  Minimal urine production.  No tears. DIAGNOSIS  Your caregiver will diagnose dehydration based on your symptoms and your exam. Blood and urine tests will help confirm the diagnosis. The diagnostic evaluation should also identify the cause of dehydration. TREATMENT  Treatment of mild or moderate dehydration can often be done at home by increasing the amount of fluids that you drink. It is best to drink small amounts of fluid more often. Drinking too much at one time can make vomiting worse. Refer to the home care instructions below. Severe dehydration needs to be treated at the hospital where you will probably be given intravenous (IV) fluids that contain water and electrolytes. HOME CARE INSTRUCTIONS   Ask your caregiver about specific rehydration instructions.  Drink enough fluids to keep your urine clear or pale yellow.  Drink small amounts frequently if you have nausea and vomiting.  Eat as you normally do.  Avoid:  Foods or drinks high in sugar.  Carbonated  drinks.  Juice.  Extremely hot or cold fluids.  Drinks with caffeine.  Fatty, greasy foods.  Alcohol.  Tobacco.  Overeating.  Gelatin desserts.  Wash your hands well to avoid spreading bacteria and viruses.  Only take over-the-counter or prescription medicines for pain, discomfort, or fever as directed by your caregiver.  Ask your caregiver if you should continue all prescribed and over-the-counter medicines.  Keep all follow-up appointments with your caregiver. SEEK MEDICAL CARE IF:  You have abdominal pain and it increases or stays in one area (localizes).  You have a rash, stiff neck, or severe headache.  You are irritable, sleepy, or difficult to awaken.  You are weak, dizzy, or extremely thirsty. SEEK IMMEDIATE MEDICAL CARE IF:   You are unable to keep fluids down or you get worse despite treatment.  You have frequent episodes of vomiting or diarrhea.  You have blood or green matter (bile) in your vomit.  You have blood in your stool or your stool looks black and tarry.  You have not urinated in 6 to 8 hours, or you have only urinated a small amount of very dark urine.  You have a fever.  You faint. MAKE SURE YOU:   Understand these instructions.  Will watch your condition.  Will get help right away if you are not doing well or get worse. Document Released: 10/04/2005 Document Revised: 12/27/2011 Document Reviewed: 05/24/2011 ExitCare Patient Information 2015 [employer]. This information is not intended to replace advice given to you by your health care provider. Make sure you discuss any questions you have with your health care   provider.  

## 2015-06-24 ENCOUNTER — Telehealth: Payer: Self-pay | Admitting: *Deleted

## 2015-06-24 NOTE — Telephone Encounter (Signed)
Pt states she does not feel any better than she did on Saturday when she came for fluids. Retaining fluid in feet and hands, eating and drinking OK. Feeling "tired and aching in hips".

## 2015-06-24 NOTE — Telephone Encounter (Signed)
-----   Message from [name], MD sent at 06/24/2015  8:26 AM EDT ----- Regarding: can you call Can you call and see how she is doing?

## 2015-06-25 ENCOUNTER — Encounter (HOSPITAL_COMMUNITY): Payer: Self-pay | Admitting: Emergency Medicine

## 2015-06-25 ENCOUNTER — Telehealth: Payer: Self-pay | Admitting: *Deleted

## 2015-06-25 ENCOUNTER — Inpatient Hospital Stay (HOSPITAL_COMMUNITY)
Admission: EM | Admit: 2015-06-25 | Discharge: 2015-07-06 | DRG: 872 | Disposition: A | Discharge status: Skilled Nursing Facility | Payer: Medicare HMO | Attending: Internal Medicine | Admitting: Internal Medicine

## 2015-06-25 ENCOUNTER — Emergency Department (HOSPITAL_COMMUNITY): Payer: Medicare HMO

## 2015-06-25 DIAGNOSIS — I4891 Unspecified atrial fibrillation: Secondary | ICD-10-CM | POA: Diagnosis present

## 2015-06-25 DIAGNOSIS — M1990 Unspecified osteoarthritis, unspecified site: Secondary | ICD-10-CM | POA: Diagnosis present

## 2015-06-25 DIAGNOSIS — D630 Anemia in neoplastic disease: Secondary | ICD-10-CM | POA: Diagnosis present

## 2015-06-25 DIAGNOSIS — E871 Hypo-osmolality and hyponatremia: Secondary | ICD-10-CM | POA: Diagnosis present

## 2015-06-25 DIAGNOSIS — E440 Moderate protein-calorie malnutrition: Secondary | ICD-10-CM | POA: Diagnosis present

## 2015-06-25 DIAGNOSIS — E876 Hypokalemia: Secondary | ICD-10-CM | POA: Diagnosis present

## 2015-06-25 DIAGNOSIS — A4151 Sepsis due to Escherichia coli [E. coli]: Principal | ICD-10-CM | POA: Diagnosis present

## 2015-06-25 DIAGNOSIS — E1165 Type 2 diabetes mellitus with hyperglycemia: Secondary | ICD-10-CM | POA: Diagnosis present

## 2015-06-25 DIAGNOSIS — Z803 Family history of malignant neoplasm of breast: Secondary | ICD-10-CM | POA: Diagnosis not present

## 2015-06-25 DIAGNOSIS — Z96659 Presence of unspecified artificial knee joint: Secondary | ICD-10-CM | POA: Diagnosis present

## 2015-06-25 DIAGNOSIS — I10 Essential (primary) hypertension: Secondary | ICD-10-CM | POA: Diagnosis present

## 2015-06-25 DIAGNOSIS — A419 Sepsis, unspecified organism: Secondary | ICD-10-CM | POA: Diagnosis present

## 2015-06-25 DIAGNOSIS — B370 Candidal stomatitis: Secondary | ICD-10-CM | POA: Diagnosis present

## 2015-06-25 DIAGNOSIS — K5909 Other constipation: Secondary | ICD-10-CM | POA: Diagnosis present

## 2015-06-25 DIAGNOSIS — I480 Paroxysmal atrial fibrillation: Secondary | ICD-10-CM | POA: Diagnosis present

## 2015-06-25 DIAGNOSIS — R5383 Other fatigue: Secondary | ICD-10-CM | POA: Diagnosis present

## 2015-06-25 DIAGNOSIS — Z7982 Long term (current) use of aspirin: Secondary | ICD-10-CM

## 2015-06-25 DIAGNOSIS — R531 Weakness: Secondary | ICD-10-CM

## 2015-06-25 DIAGNOSIS — E860 Dehydration: Secondary | ICD-10-CM | POA: Diagnosis present

## 2015-06-25 DIAGNOSIS — B9620 Unspecified Escherichia coli [E. coli] as the cause of diseases classified elsewhere: Secondary | ICD-10-CM | POA: Diagnosis present

## 2015-06-25 DIAGNOSIS — R7989 Other specified abnormal findings of blood chemistry: Secondary | ICD-10-CM | POA: Diagnosis present

## 2015-06-25 DIAGNOSIS — N189 Chronic kidney disease, unspecified: Secondary | ICD-10-CM | POA: Diagnosis present

## 2015-06-25 DIAGNOSIS — N183 Chronic kidney disease, stage 3 unspecified: Secondary | ICD-10-CM | POA: Diagnosis present

## 2015-06-25 DIAGNOSIS — R4702 Dysphasia: Secondary | ICD-10-CM | POA: Diagnosis present

## 2015-06-25 DIAGNOSIS — E870 Hyperosmolality and hypernatremia: Secondary | ICD-10-CM | POA: Diagnosis present

## 2015-06-25 DIAGNOSIS — K5900 Constipation, unspecified: Secondary | ICD-10-CM | POA: Diagnosis present

## 2015-06-25 DIAGNOSIS — Z7952 Long term (current) use of systemic steroids: Secondary | ICD-10-CM

## 2015-06-25 DIAGNOSIS — Z6825 Body mass index (BMI) 25.0-25.9, adult: Secondary | ICD-10-CM | POA: Diagnosis not present

## 2015-06-25 DIAGNOSIS — D6481 Anemia due to antineoplastic chemotherapy: Secondary | ICD-10-CM | POA: Diagnosis present

## 2015-06-25 DIAGNOSIS — C8590 Non-Hodgkin lymphoma, unspecified, unspecified site: Secondary | ICD-10-CM | POA: Diagnosis present

## 2015-06-25 DIAGNOSIS — A4150 Gram-negative sepsis, unspecified: Secondary | ICD-10-CM | POA: Diagnosis present

## 2015-06-25 DIAGNOSIS — M25562 Pain in left knee: Secondary | ICD-10-CM

## 2015-06-25 DIAGNOSIS — R7881 Bacteremia: Secondary | ICD-10-CM | POA: Diagnosis not present

## 2015-06-25 DIAGNOSIS — Z9181 History of falling: Secondary | ICD-10-CM | POA: Diagnosis not present

## 2015-06-25 DIAGNOSIS — G622 Polyneuropathy due to other toxic agents: Secondary | ICD-10-CM | POA: Diagnosis present

## 2015-06-25 DIAGNOSIS — I129 Hypertensive chronic kidney disease with stage 1 through stage 4 chronic kidney disease, or unspecified chronic kidney disease: Secondary | ICD-10-CM | POA: Diagnosis present

## 2015-06-25 DIAGNOSIS — I248 Other forms of acute ischemic heart disease: Secondary | ICD-10-CM | POA: Diagnosis present

## 2015-06-25 DIAGNOSIS — K1231 Oral mucositis (ulcerative) due to antineoplastic therapy: Secondary | ICD-10-CM | POA: Diagnosis present

## 2015-06-25 DIAGNOSIS — B3781 Candidal esophagitis: Secondary | ICD-10-CM | POA: Diagnosis present

## 2015-06-25 DIAGNOSIS — R739 Hyperglycemia, unspecified: Secondary | ICD-10-CM | POA: Diagnosis present

## 2015-06-25 DIAGNOSIS — R627 Adult failure to thrive: Secondary | ICD-10-CM | POA: Diagnosis present

## 2015-06-25 DIAGNOSIS — C8230 Follicular lymphoma grade IIIa, unspecified site: Secondary | ICD-10-CM | POA: Diagnosis present

## 2015-06-25 DIAGNOSIS — Z807 Family history of other malignant neoplasms of lymphoid, hematopoietic and related tissues: Secondary | ICD-10-CM

## 2015-06-25 DIAGNOSIS — N1831 Chronic kidney disease, stage 3a: Secondary | ICD-10-CM | POA: Diagnosis present

## 2015-06-25 DIAGNOSIS — D638 Anemia in other chronic diseases classified elsewhere: Secondary | ICD-10-CM | POA: Diagnosis not present

## 2015-06-25 DIAGNOSIS — E039 Hypothyroidism, unspecified: Secondary | ICD-10-CM | POA: Diagnosis present

## 2015-06-25 DIAGNOSIS — N179 Acute kidney failure, unspecified: Secondary | ICD-10-CM | POA: Diagnosis present

## 2015-06-25 DIAGNOSIS — M79606 Pain in leg, unspecified: Secondary | ICD-10-CM | POA: Diagnosis not present

## 2015-06-25 DIAGNOSIS — R778 Other specified abnormalities of plasma proteins: Secondary | ICD-10-CM | POA: Diagnosis present

## 2015-06-25 DIAGNOSIS — T451X5A Adverse effect of antineoplastic and immunosuppressive drugs, initial encounter: Secondary | ICD-10-CM | POA: Diagnosis present

## 2015-06-25 DIAGNOSIS — N289 Disorder of kidney and ureter, unspecified: Secondary | ICD-10-CM

## 2015-06-25 DIAGNOSIS — E861 Hypovolemia: Secondary | ICD-10-CM | POA: Diagnosis present

## 2015-06-25 DIAGNOSIS — Z79899 Other long term (current) drug therapy: Secondary | ICD-10-CM | POA: Diagnosis not present

## 2015-06-25 DIAGNOSIS — G620 Drug-induced polyneuropathy: Secondary | ICD-10-CM | POA: Diagnosis present

## 2015-06-25 DIAGNOSIS — C8238 Follicular lymphoma grade IIIa, lymph nodes of multiple sites: Secondary | ICD-10-CM | POA: Diagnosis present

## 2015-06-25 DIAGNOSIS — E119 Type 2 diabetes mellitus without complications: Secondary | ICD-10-CM

## 2015-06-25 DIAGNOSIS — T380X5A Adverse effect of glucocorticoids and synthetic analogues, initial encounter: Secondary | ICD-10-CM | POA: Diagnosis present

## 2015-06-25 DIAGNOSIS — D72829 Elevated white blood cell count, unspecified: Secondary | ICD-10-CM | POA: Diagnosis present

## 2015-06-25 DIAGNOSIS — I2489 Other forms of acute ischemic heart disease: Secondary | ICD-10-CM | POA: Diagnosis present

## 2015-06-25 DIAGNOSIS — N390 Urinary tract infection, site not specified: Secondary | ICD-10-CM | POA: Diagnosis present

## 2015-06-25 DIAGNOSIS — R6520 Severe sepsis without septic shock: Secondary | ICD-10-CM

## 2015-06-25 DIAGNOSIS — R110 Nausea: Secondary | ICD-10-CM | POA: Diagnosis present

## 2015-06-25 DIAGNOSIS — E875 Hyperkalemia: Secondary | ICD-10-CM | POA: Diagnosis present

## 2015-06-25 DIAGNOSIS — R000 Tachycardia, unspecified: Secondary | ICD-10-CM | POA: Diagnosis not present

## 2015-06-25 HISTORY — DX: Other specified abnormal findings of blood chemistry: R79.89

## 2015-06-25 HISTORY — DX: Candidal esophagitis: B37.81

## 2015-06-25 HISTORY — DX: Chronic kidney disease, stage 3 (moderate): N18.3

## 2015-06-25 HISTORY — DX: Unspecified atrial fibrillation: I48.91

## 2015-06-25 HISTORY — DX: Candidal stomatitis: B37.0

## 2015-06-25 LAB — URINALYSIS, ROUTINE W REFLEX MICROSCOPIC
Bilirubin Urine: NEGATIVE
Glucose, UA: NEGATIVE mg/dL
Ketones, ur: NEGATIVE mg/dL
Nitrite: POSITIVE — AB
Protein, ur: 100 mg/dL — AB
Specific Gravity, Urine: 1.014 (ref 1.005–1.030)
Urobilinogen, UA: 1 mg/dL (ref 0.0–1.0)
pH: 5.5 (ref 5.0–8.0)

## 2015-06-25 LAB — HEPATIC FUNCTION PANEL
ALT: 106 U/L — ABNORMAL HIGH (ref 14–54)
AST: 157 U/L — ABNORMAL HIGH (ref 15–41)
Albumin: 2.8 g/dL — ABNORMAL LOW (ref 3.5–5.0)
Alkaline Phosphatase: 106 U/L (ref 38–126)
Bilirubin, Direct: 0.1 mg/dL (ref 0.1–0.5)
Indirect Bilirubin: 0.6 mg/dL (ref 0.3–0.9)
Total Bilirubin: 0.7 mg/dL (ref 0.3–1.2)
Total Protein: 6.2 g/dL — ABNORMAL LOW (ref 6.5–8.1)

## 2015-06-25 LAB — CREATININE, SERUM
Creatinine, Ser: 2.44 mg/dL — ABNORMAL HIGH (ref 0.44–1.00)
GFR calc Af Amer: 22 mL/min — ABNORMAL LOW (ref >60)
GFR calc non Af Amer: 19 mL/min — ABNORMAL LOW (ref >60)

## 2015-06-25 LAB — MRSA PCR SCREENING: MRSA by PCR: NEGATIVE

## 2015-06-25 LAB — CBC
HCT: 36.8 % (ref 36.0–46.0)
HCT: 34.4 % — ABNORMAL LOW (ref 36.0–46.0)
Hemoglobin: 11.7 g/dL — ABNORMAL LOW (ref 12.0–15.0)
Hemoglobin: 11 g/dL — ABNORMAL LOW (ref 12.0–15.0)
MCH: 31 pg (ref 26.0–34.0)
MCH: 31.2 pg (ref 26.0–34.0)
MCHC: 31.8 g/dL (ref 30.0–36.0)
MCHC: 32 g/dL (ref 30.0–36.0)
MCV: 97.6 fL (ref 78.0–100.0)
MCV: 97.5 fL (ref 78.0–100.0)
Platelets: 323 10*3/uL (ref 150–400)
Platelets: 317 10*3/uL (ref 150–400)
RBC: 3.77 MIL/uL — ABNORMAL LOW (ref 3.87–5.11)
RBC: 3.53 MIL/uL — ABNORMAL LOW (ref 3.87–5.11)
RDW: 15 % (ref 11.5–15.5)
RDW: 15.1 % (ref 11.5–15.5)
WBC: 19.2 10*3/uL — ABNORMAL HIGH (ref 4.0–10.5)
WBC: 21.6 10*3/uL — ABNORMAL HIGH (ref 4.0–10.5)

## 2015-06-25 LAB — BASIC METABOLIC PANEL
Anion gap: 13 (ref 5–15)
BUN: 54 mg/dL — ABNORMAL HIGH (ref 6–20)
CO2: 24 mmol/L (ref 22–32)
Calcium: 8.5 mg/dL — ABNORMAL LOW (ref 8.9–10.3)
Chloride: 113 mmol/L — ABNORMAL HIGH (ref 101–111)
Creatinine, Ser: 2.51 mg/dL — ABNORMAL HIGH (ref 0.44–1.00)
GFR calc Af Amer: 21 mL/min — ABNORMAL LOW (ref >60)
GFR calc non Af Amer: 18 mL/min — ABNORMAL LOW (ref >60)
Glucose, Bld: 187 mg/dL — ABNORMAL HIGH (ref 65–99)
Potassium: 3.2 mmol/L — ABNORMAL LOW (ref 3.5–5.1)
Sodium: 150 mmol/L — ABNORMAL HIGH (ref 135–145)

## 2015-06-25 LAB — TROPONIN I
Troponin I: 0.96 ng/mL (ref <0.031)
Troponin I: 0.69 ng/mL (ref <0.031)

## 2015-06-25 LAB — I-STAT CG4 LACTIC ACID, ED: Lactic Acid, Venous: 1.39 mmol/L (ref 0.5–2.0)

## 2015-06-25 LAB — URINE MICROSCOPIC-ADD ON

## 2015-06-25 LAB — CBG MONITORING, ED: Glucose-Capillary: 112 mg/dL — ABNORMAL HIGH (ref 65–99)

## 2015-06-25 MED ORDER — DILTIAZEM HCL 25 MG/5ML IV SOLN
15.0000 mg | Freq: Once | INTRAVENOUS | Status: AC
  Administered 2015-06-25: 15 mg via INTRAVENOUS

## 2015-06-25 MED ORDER — ACETAMINOPHEN 325 MG PO TABS
650.0000 mg | ORAL_TABLET | Freq: Four times a day (QID) | ORAL | Status: DC | PRN
Start: 2015-06-25 — End: 2015-07-06
  Administered 2015-06-30: 325 mg via ORAL
  Administered 2015-07-01: 650 mg via ORAL
  Filled 2015-06-25 (×2): qty 2

## 2015-06-25 MED ORDER — METOPROLOL TARTRATE 1 MG/ML IV SOLN: 5.0000 mg | Freq: Once | INTRAVENOUS | Status: DC

## 2015-06-25 MED ORDER — DILTIAZEM LOAD VIA INFUSION
15.0000 mg | Freq: Once | INTRAVENOUS | Status: AC
  Administered 2015-06-25: 15 mg via INTRAVENOUS
  Filled 2015-06-25: qty 15

## 2015-06-25 MED ORDER — POTASSIUM CHLORIDE IN NACL 40-0.9 MEQ/L-% IV SOLN
INTRAVENOUS | Status: DC
  Administered 2015-06-25: 100 mL/h via INTRAVENOUS
  Filled 2015-06-25 (×3): qty 1000

## 2015-06-25 MED ORDER — FLUCONAZOLE IN SODIUM CHLORIDE 200-0.9 MG/100ML-% IV SOLN
200.0000 mg | INTRAVENOUS | Status: DC
  Administered 2015-06-25 – 2015-06-30 (×6): 200 mg via INTRAVENOUS
  Filled 2015-06-25 (×7): qty 100

## 2015-06-25 MED ORDER — ENOXAPARIN SODIUM 30 MG/0.3ML ~~LOC~~ SOLN
30.0000 mg | SUBCUTANEOUS | Status: DC
  Administered 2015-06-25 – 2015-06-30 (×6): 30 mg via SUBCUTANEOUS
  Filled 2015-06-25 (×7): qty 0.3

## 2015-06-25 MED ORDER — DEXTROSE 5 % IV SOLN
1.0000 g | INTRAVENOUS | Status: DC
  Administered 2015-06-25: 1 g via INTRAVENOUS
  Filled 2015-06-25: qty 10

## 2015-06-25 MED ORDER — NYSTATIN 100000 UNIT/ML MT SUSP
5.0000 mL | Freq: Four times a day (QID) | OROMUCOSAL | Status: DC
  Administered 2015-06-25 – 2015-07-06 (×44): 500000 [IU] via ORAL
  Filled 2015-06-25 (×47): qty 5

## 2015-06-25 MED ORDER — PIPERACILLIN-TAZOBACTAM 3.375 G IVPB 30 MIN
3.3750 g | Freq: Once | INTRAVENOUS | Status: AC
  Administered 2015-06-25: 3.375 g via INTRAVENOUS
  Filled 2015-06-25: qty 50

## 2015-06-25 MED ORDER — METOPROLOL TARTRATE 1 MG/ML IV SOLN
5.0000 mg | Freq: Once | INTRAVENOUS | Status: AC
  Administered 2015-06-25: 5 mg via INTRAVENOUS
  Filled 2015-06-25: qty 5

## 2015-06-25 MED ORDER — ALUM & MAG HYDROXIDE-SIMETH 200-200-20 MG/5ML PO SUSP: 30.0000 mL | Freq: Four times a day (QID) | ORAL | Status: DC | PRN

## 2015-06-25 MED ORDER — LEVOTHYROXINE SODIUM 100 MCG PO TABS
100.0000 ug | ORAL_TABLET | Freq: Every day | ORAL | Status: DC
  Administered 2015-06-26 – 2015-07-06 (×11): 100 ug via ORAL
  Filled 2015-06-25: qty 1
  Filled 2015-06-25 (×2): qty 2
  Filled 2015-06-25 (×11): qty 1

## 2015-06-25 MED ORDER — ACETAMINOPHEN 650 MG RE SUPP: 650.0000 mg | Freq: Four times a day (QID) | RECTAL | Status: DC | PRN

## 2015-06-25 MED ORDER — ONDANSETRON HCL 4 MG/2ML IJ SOLN: 4.0000 mg | Freq: Four times a day (QID) | INTRAMUSCULAR | Status: DC | PRN

## 2015-06-25 MED ORDER — ASPIRIN 81 MG PO CHEW
81.0000 mg | CHEWABLE_TABLET | Freq: Every day | ORAL | Status: DC
  Administered 2015-06-26 – 2015-07-06 (×11): 81 mg via ORAL
  Filled 2015-06-25 (×11): qty 1

## 2015-06-25 MED ORDER — SODIUM CHLORIDE 0.9 % IV BOLUS (SEPSIS)
500.0000 mL | Freq: Once | INTRAVENOUS | Status: AC
  Administered 2015-06-25: 500 mL via INTRAVENOUS

## 2015-06-25 MED ORDER — OXYCODONE HCL 5 MG PO TABS
5.0000 mg | ORAL_TABLET | ORAL | Status: DC | PRN
  Administered 2015-07-01 – 2015-07-05 (×8): 5 mg via ORAL
  Filled 2015-06-25 (×8): qty 1

## 2015-06-25 MED ORDER — PANTOPRAZOLE SODIUM 40 MG PO TBEC
40.0000 mg | DELAYED_RELEASE_TABLET | Freq: Every day | ORAL | Status: DC
  Administered 2015-06-26 – 2015-07-06 (×11): 40 mg via ORAL
  Filled 2015-06-25 (×16): qty 1

## 2015-06-25 MED ORDER — ADULT MULTIVITAMIN W/MINERALS CH
1.0000 | ORAL_TABLET | Freq: Every day | ORAL | Status: DC
  Administered 2015-06-25 – 2015-07-06 (×9): 1 via ORAL
  Filled 2015-06-25 (×12): qty 1

## 2015-06-25 MED ORDER — POTASSIUM CHLORIDE 10 MEQ/50ML IV SOLN
10.0000 meq | INTRAVENOUS | Status: AC
  Administered 2015-06-25 (×4): 10 meq via INTRAVENOUS
  Filled 2015-06-25 (×6): qty 50

## 2015-06-25 MED ORDER — ONDANSETRON HCL 4 MG PO TABS: 4.0000 mg | ORAL_TABLET | Freq: Four times a day (QID) | ORAL | Status: DC | PRN

## 2015-06-25 MED ORDER — DILTIAZEM HCL 60 MG PO TABS
60.0000 mg | ORAL_TABLET | Freq: Three times a day (TID) | ORAL | Status: DC
  Administered 2015-06-25 – 2015-06-27 (×5): 60 mg via ORAL
  Filled 2015-06-25 (×5): qty 1

## 2015-06-25 MED ORDER — DEXTROSE 5 % IV SOLN
5.0000 mg/h | INTRAVENOUS | Status: DC
  Administered 2015-06-25: 5 mg/h via INTRAVENOUS
  Filled 2015-06-25 (×2): qty 100

## 2015-06-25 MED ORDER — SODIUM CHLORIDE 0.9 % IV SOLN
INTRAVENOUS | Status: DC
  Administered 2015-06-25: 14:00:00 via INTRAVENOUS

## 2015-06-25 MED ORDER — CALCIUM CARBONATE ANTACID 500 MG PO CHEW
2.0000 | CHEWABLE_TABLET | Freq: Every day | ORAL | Status: DC
  Administered 2015-06-27 – 2015-07-06 (×6): 400 mg via ORAL
  Filled 2015-06-25 (×12): qty 2

## 2015-06-25 MED ORDER — LORATADINE 10 MG PO TABS
10.0000 mg | ORAL_TABLET | Freq: Every day | ORAL | Status: DC
  Administered 2015-06-25 – 2015-07-06 (×9): 10 mg via ORAL
  Filled 2015-06-25 (×12): qty 1

## 2015-06-25 NOTE — Telephone Encounter (Signed)
OK I am keeping an eye on it

## 2015-06-25 NOTE — ED Notes (Signed)
Attempted blood draw and no success, lab called.

## 2015-06-25 NOTE — Progress Notes (Signed)
CRITICAL VALUE ALERT  Critical value received:  Troponin 0.96  Date of notification:  06/25/15  Time of notification:  1275  Critical value read back: yes  Nurse who received alert:  Geannie Risen, RN  MD notified (1st page):  06/25/15  Time of first page:  1857  MD notified (2nd page):  Time of second page:  Responding MD:  Rama, MD  Time MD responded:  1900

## 2015-06-25 NOTE — ED Notes (Signed)
Bed: UA04 Expected date:  Expected time:  Means of arrival:  Comments: t2

## 2015-06-25 NOTE — ED Notes (Signed)
Pt from home c/o weakness, loss of appetite, and falling out of bed this a.m due to weakness. Pt being treated for Non-Hodgkins Lymphoma. Last treatment was 8/15.

## 2015-06-25 NOTE — H&P (Addendum)
History and Physical:    Carrie [name]   EXN:[id number] DOB: Nov 03, 1940 DOA: 06/25/2015  Referring MD/provider: Charlesetta Shanks, MD PCP: Darlyn Chamber, MD   Chief Complaint: Weakness  History of Present Illness:   Carrie [name] is an 74 y.o. female with a PMH of follicular lymphoma grade 3A, under the care of Dr. Alvy Bimler, originally diagnosed 01/21/15 with initiation of R-CHOP chemotherapy 02/17/15 s/p 6 cycles with last cycle given 06/02/15, with subsequent follow-up PET scan showing near complete response to treatment. She was evaluated at the cancer center on 06/18/15 secondary to diarrhea, dizziness and mild nausea. She has not had much of an appetite since then.  She was documented to have lost 5 pounds in a few days. She received IV fluids at the cancer center on 8/31, 9/1, 9/2 and 9/3 but despite this, she has continued to feel weak.  She has had abdominal discomfort/dyspepsia per her family, but denies any specific symptoms.  Upon initial evaluation in the ED, the patient was noted to have findings concerning for a UTI.  A 12 lead EKG showed atrial fibrillation with RVR (no known history of same). Denies chest pain and dyspnea.  No complaints of palpitations.  ROS:   Review of Systems  Constitutional: Positive for weight loss and malaise/fatigue. Negative for fever and chills.  HENT: Negative for congestion, ear pain, hearing loss and sore throat.   Eyes: Negative.   Respiratory: Positive for shortness of breath.        Dyspnea on exertion  Cardiovascular: Negative.   Gastrointestinal: Positive for heartburn, nausea, abdominal pain and diarrhea. Negative for vomiting, blood in stool and melena.  Genitourinary: Negative.   Musculoskeletal: Positive for myalgias and falls.  Skin: Negative.   Neurological: Positive for dizziness and weakness. Negative for headaches.  Endo/Heme/Allergies: Bruises/bleeds easily.       Cold intolerance.     Past Medical History:   Past Medical  History  Diagnosis Date  . DJD (degenerative joint disease)   . Hypothyroid   . Cervical spondylosis without myelopathy 10/25/2013  . Dental bridge present   . Follicular lymphoma grade 3a 02/03/2015  . Pneumonia   . Anemia     "years ago"  . Nausea without vomiting 06/20/2015  . Elevated troponin 02/18/2015  . Thrush of mouth and esophagus 06/25/2015  . Atrial fibrillation with rapid ventricular response 06/25/2015    Past Surgical History:   Past Surgical History  Procedure Laterality Date  . Patelar tendon transplants      Left/right  . Abdominal hysterectomy    . Dilation and curettage of uterus    . Total knee arthroplasty  2011    Right  . Appendectomy    . Ovarian cyst resection    . Back surgery      X5-lumbar-fusion  . Lymph node biopsy Right 01/21/2015    Procedure: RIGHT GROIN LYMPH NODE BIOPSY;  Surgeon: Erroll Luna, MD;  Location: [place];  Service: General;  Laterality: Right;  . Colonoscopy    . Portacath placement Right 02/13/2015    Procedure: INSERTION PORT-A-CATH WITH ULTRASOUND;  Surgeon: Erroll Luna, MD;  Location: [place] [facility];  Service: General;  Laterality: Right;    Social History:   Social History   Social History  . Marital Status: Widowed    Spouse Name: N/A  . Number of Children: 2  . Years of Education: hs   Occupational History  . Retired    Social History Main  Topics  . Smoking status: Never Smoker   . Smokeless tobacco: Never Used  . Alcohol Use: Yes     Comment: occassional wine  . Drug Use: No  . Sexual Activity: Not on file   Other Topics Concern  . Not on file   Social History Narrative   Lives alone.  Has a walker for home use.    Family history:   Family History  Problem Relation Age of Onset  . Cancer Mother     Breast, lung NHL  . Cancer Sister     Multiple myeloma    Allergies   Review of patient's allergies indicates no known allergies.  Current Medications:   Prior to Admission medications    Medication Sig Start Date End Date Taking? Authorizing Provider  acetaminophen (TYLENOL) 325 MG tablet Take 650 mg by mouth every 6 (six) hours as needed.   Yes Historical Provider, MD  aspirin 81 MG tablet Take 81 mg by mouth daily.   Yes Historical Provider, MD  calcium carbonate (TUMS) 500 MG chewable tablet Chew 2 tablets (400 mg of elemental calcium total) by mouth daily. 06/21/15  Yes Volanda Napoleon, MD  levothyroxine (SYNTHROID, LEVOTHROID) 100 MCG tablet Take 100 mcg by mouth daily. 04/22/15  Yes Historical Provider, MD  lidocaine-prilocaine (EMLA) cream Apply 1 application topically as needed. Apply to port a cath site one hour prior to needle stick 02/07/15  Yes [name], MD  loratadine (CLARITIN) 10 MG tablet Take 10 mg by mouth daily.   Yes Historical Provider, MD  Multiple Vitamins-Minerals (AIRBORNE GUMMIES PO) Take by mouth.   Yes Historical Provider, MD  ondansetron (ZOFRAN ODT) 8 MG disintegrating tablet Take 1 tablet (8 mg total) by mouth every 8 (eight) hours as needed for nausea or vomiting. 06/20/15  Yes [name], MD  predniSONE (DELTASONE) 20 MG tablet Take 3 tabs on days 2-5 every cycle of 21 days 05/20/15  Yes [name], MD  PRESCRIPTION MEDICATION Chemo at [facility].   Yes Historical Provider, MD  prochlorperazine (COMPAZINE) 10 MG tablet Take 1 tablet by mouth every 6 (six) hours as needed. Nausea/vomiting. 06/12/15  Yes Historical Provider, MD    Physical Exam:   Filed Vitals:   06/25/15 1132 06/25/15 1457  BP: 129/86 139/75  Pulse: 108 108  Temp: 97.5 F (36.4 C)   TempSrc: Oral   Resp: 20 20  SpO2: 95% 100%     Physical Exam: Blood pressure 139/75, pulse 108, temperature 97.5 F (36.4 C), temperature source Oral, resp. rate 20, SpO2 100 %. Gen: No acute distress. Generalized weakness with somnolence. Head: Normocephalic, atraumatic. Eyes: PERRL, EOMI, sclerae nonicteric. Mouth: Oropharynx shows thrush on palate, posterior pharynx. Neck: Supple, no thyromegaly,  no lymphadenopathy, no jugular venous distention. Chest: Lungs diminished to auscultation but otherwise clear. CV: Heart sounds are tachycardic and irregular. No murmurs, rubs, or gallops. Abdomen: Soft, nontender, nondistended with normal active bowel sounds. Extremities: Extremities show trace edema bilaterally. Skin: Warm and dry. Bruise noted at the xiphoid process and some bruising to the lower extremities. Neuro: Groggy but awakens to voice; generalized weakness but grossly nonfocal. Psych: Mood and affect depressed/flat.   Data Review:    Labs: Basic Metabolic Panel:  Recent Labs Lab 06/25/15 1200  NA 150*  K 3.2*  CL 113*  CO2 24  GLUCOSE 187*  BUN 54*  CREATININE 2.51*  CALCIUM 8.5*   Liver Function Tests: No results for input(s): AST, ALT, ALKPHOS, BILITOT, PROT, ALBUMIN in  the last 168 hours. No results for input(s): LIPASE, AMYLASE in the last 168 hours. No results for input(s): AMMONIA in the last 168 hours. CBC:  Recent Labs Lab 06/25/15 1200  WBC 19.2*  HGB 11.7*  HCT 36.8  MCV 97.6  PLT 323   CBG:  Recent Labs Lab 06/25/15 1431  GLUCAP 112*    Radiographic Studies: Dg Chest Port 1 View  06/25/2015   CLINICAL DATA:  Patient diagnosed with non-Hodgkin's lymphoma April, 2016. Weakness since 06/14/2015.  EXAM: PORTABLE CHEST - 1 VIEW  COMPARISON:  PET CT scan 04/18/2015. Single view of the chest 02/13/2015.  FINDINGS: Right IJ Port-A-Cath remains in place, unchanged. The lungs are clear. Heart size mildly enlarged. No pneumothorax or pleural effusion. No focal bony abnormality.  IMPRESSION: No acute disease.   Electronically Signed   By: Inge Rise M.D.   On: 06/25/2015 13:51   *I have personally reviewed the images above*  EKG: Independently reviewed. Atrial fibrillation with RVR.   Assessment/Plan:   Principal Problems:   Acute kidney injury secondary to dehydration/hypernatremia - Admit for IV fluids, antinausea medications and  correction of electrolytes.    UTI - Given a dose of Zosyn in the ED. We'll narrow to Rocephin. Check urine cultures. - Urinalysis positive for nitrites and leukocytes.  Atrial fibrillation with RVR - New onset. Cardizem load ordered by EDP. Cardizem drip ordered for rate control. - Cycle cardiac markers. - Check TSH. - CHADS2-VASC score is 2 (high risk) with a 2.2% yearly risk of stroke, oral anticoagulation recommended. - We'll see if she converts back to NSR and monitor on telemetry. - Urgent cardiology consultation requested given ongoing RVR despite maximum Cardizem dose. - Also given metoprolol, but dropping BP concerning.  Active Problems:   Hypokalemia - Add potassium to IV fluids.    Oral and esophageal thrush - Start Diflucan and nystatin oral suspension.    Hypothyroidism - Continue home dose of Synthroid. Check TSH.    Follicular lymphoma grade 3a - We'll notify Dr. Alvy Bimler of the patient's admission.    Other fatigue - PT evaluation requested.    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    Anemia due to antineoplastic chemotherapy - Mild but likely hemoconcentrated so suspect hemoglobin will drop with hydration.    Leukocytosis - Hemoconcentrated. Treat UTI and monitor WBC.    Nausea without vomiting - Antinausea medications ordered. - Start PPI.    Hyperglycemia - Possibly related to recent steroids. Check hemoglobin A1c.    DVT prophylaxis - Lovenox ordered.  Code Status: Full. Family Communication: Glorianne Manchester (daughter) is emergency contact.  Son-in-law updated at the bedside. Disposition Plan: Home when stable, likely 3 days when patient's symptoms improved, renal failure resolved, and cultures/sensitivities back.  Time spent: One hour.  Ayaan Shutes [facility] Pager [phone] Cell: [phone]   If 7PM-7AM, please contact night-coverage [email or web address] Password [email or web address] 06/25/2015, 4:20 PM

## 2015-06-25 NOTE — ED Provider Notes (Signed)
CSN: [id number]     Arrival date & time 06/25/15  1128 History   First MD Initiated Contact with Patient 06/25/15 1318     Chief Complaint  Patient presents with  . Weakness     (Consider location/radiation/quality/duration/timing/severity/associated sxs/prior Treatment) HPI Patient has history of lymphoma undergoing chemotherapy. 2 weeks ago she developed copious diarrhea and weakness. The patient has been getting outpatient fluid hydration. She had improved over the weekend but Sunday became significantly more weak and ill again. He is no documented fever. The patient had an episode this morning whereby she slipped to the floor without injury due to weakness. No chest pain or abdominal pain. Patient has not noted dysuria or urgency. Past Medical History  Diagnosis Date  . DJD (degenerative joint disease)   . Hypothyroid   . Cervical spondylosis without myelopathy 10/25/2013  . Dental bridge present   . Follicular lymphoma grade 3a 02/03/2015  . Pneumonia   . Anemia     "years ago"  . Nausea without vomiting 06/20/2015  . Elevated troponin 02/18/2015   Past Surgical History  Procedure Laterality Date  . Patelar tendon transplants      Left/right  . Abdominal hysterectomy    . Dilation and curettage of uterus    . Total knee arthroplasty  2011    Right  . Appendectomy    . Ovarian cyst resection    . Back surgery      X5-lumbar-fusion  . Lymph node biopsy Right 01/21/2015    Procedure: RIGHT GROIN LYMPH NODE BIOPSY;  Surgeon: Erroll Luna, MD;  Location: [place];  Service: General;  Laterality: Right;  . Colonoscopy    . Portacath placement Right 02/13/2015    Procedure: INSERTION PORT-A-CATH WITH ULTRASOUND;  Surgeon: Erroll Luna, MD;  Location: [place];  Service: General;  Laterality: Right;   Family History  Problem Relation Age of Onset  . Cancer Mother     Breast, lung NHL  . Cancer Sister     Multiple myeloma   Social History  Substance Use Topics   . Smoking status: Never Smoker   . Smokeless tobacco: Never Used  . Alcohol Use: Yes     Comment: occassional wine   OB History    No data available     Review of Systems  10 Systems reviewed and are negative for acute change except as noted in the HPI.   Allergies  Review of patient's allergies indicates no known allergies.  Home Medications   Prior to Admission medications   Medication Sig Start Date End Date Taking? Authorizing Provider  acetaminophen (TYLENOL) 325 MG tablet Take 650 mg by mouth every 6 (six) hours as needed.   Yes Historical Provider, MD  aspirin 81 MG tablet Take 81 mg by mouth daily.   Yes Historical Provider, MD  calcium carbonate (TUMS) 500 MG chewable tablet Chew 2 tablets (400 mg of elemental calcium total) by mouth daily. 06/21/15  Yes Volanda Napoleon, MD  levothyroxine (SYNTHROID, LEVOTHROID) 100 MCG tablet Take 100 mcg by mouth daily. 04/22/15  Yes Historical Provider, MD  lidocaine-prilocaine (EMLA) cream Apply 1 application topically as needed. Apply to port a cath site one hour prior to needle stick 02/07/15  Yes [name], MD  loratadine (CLARITIN) 10 MG tablet Take 10 mg by mouth daily.   Yes Historical Provider, MD  Multiple Vitamins-Minerals (AIRBORNE GUMMIES PO) Take by mouth.   Yes Historical Provider, MD  ondansetron (ZOFRAN ODT) 8 MG  disintegrating tablet Take 1 tablet (8 mg total) by mouth every 8 (eight) hours as needed for nausea or vomiting. 06/20/15  Yes [name], MD  predniSONE (DELTASONE) 20 MG tablet Take 3 tabs on days 2-5 every cycle of 21 days 05/20/15  Yes [name], MD  PRESCRIPTION MEDICATION Chemo at [facility].   Yes Historical Provider, MD  prochlorperazine (COMPAZINE) 10 MG tablet Take 1 tablet by mouth every 6 (six) hours as needed. Nausea/vomiting. 06/12/15  Yes Historical Provider, MD   BP 139/75 mmHg  Pulse 108  Temp(Src) 97.5 F (36.4 C) (Oral)  Resp 20  SpO2 100% Physical Exam  Constitutional: She is oriented to person,  place, and time.  Patient is weak and debilitated appearing. She is alert and appropriate. She rests between examination questions.  HENT:  Head: Normocephalic and atraumatic.  Eyes: EOM are normal. Pupils are equal, round, and reactive to light.  Neck: Neck supple.  Cardiovascular: Normal rate, regular rhythm, normal heart sounds and intact distal pulses.   Pulmonary/Chest: Effort normal and breath sounds normal.  Abdominal: Soft. Bowel sounds are normal. She exhibits no distension. There is no tenderness.  Musculoskeletal: Normal range of motion. Edema: 1+ bilateral lower extremities without calf tenderness.  Neurological: She is alert and oriented to person, place, and time. She has normal strength. Coordination normal. GCS eye subscore is 4. GCS verbal subscore is 5. GCS motor subscore is 6.  Skin: Skin is warm, dry and intact.  Psychiatric: She has a normal mood and affect.    ED Course  Procedures (including critical care time) Labs Review Labs Reviewed  BASIC METABOLIC PANEL - Abnormal; Notable for the following:    Sodium 150 (*)    Potassium 3.2 (*)    Chloride 113 (*)    Glucose, Bld 187 (*)    BUN 54 (*)    Creatinine, Ser 2.51 (*)    Calcium 8.5 (*)    GFR calc non Af Amer 18 (*)    GFR calc Af Amer 21 (*)    All other components within normal limits  CBC - Abnormal; Notable for the following:    WBC 19.2 (*)    RBC 3.77 (*)    Hemoglobin 11.7 (*)    All other components within normal limits  URINALYSIS, ROUTINE W REFLEX MICROSCOPIC (NOT AT [facility]) - Abnormal; Notable for the following:    APPearance CLOUDY (*)    Hgb urine dipstick LARGE (*)    Protein, ur 100 (*)    Nitrite POSITIVE (*)    Leukocytes, UA LARGE (*)    All other components within normal limits  URINE MICROSCOPIC-ADD ON - Abnormal; Notable for the following:    Bacteria, UA MANY (*)    All other components within normal limits  CBG MONITORING, ED - Abnormal; Notable for the following:     Glucose-Capillary 112 (*)    All other components within normal limits  CULTURE, BLOOD (SINGLE)  URINE CULTURE  HEMOGLOBIN A1C  HEPATIC FUNCTION PANEL  TROPONIN I  TROPONIN I  TROPONIN I  TSH  I-STAT CG4 LACTIC ACID, ED  I-STAT CG4 LACTIC ACID, ED    Imaging Review Dg Chest Port 1 View  06/25/2015   CLINICAL DATA:  Patient diagnosed with non-Hodgkin's lymphoma April, 2016. Weakness since 06/14/2015.  EXAM: PORTABLE CHEST - 1 VIEW  COMPARISON:  PET CT scan 04/18/2015. Single view of the chest 02/13/2015.  FINDINGS: Right IJ Port-A-Cath remains in place, unchanged. The lungs are clear.  Heart size mildly enlarged. No pneumothorax or pleural effusion. No focal bony abnormality.  IMPRESSION: No acute disease.   Electronically Signed   By: Inge Rise M.D.   On: 06/25/2015 13:51   I have personally reviewed and evaluated these images and lab results as part of my medical decision-making.   EKG Interpretation   Date/Time:  Wednesday June 25 2015 11:54:29 EDT Ventricular Rate:  103 PR Interval:  143 QRS Duration: 78 QT Interval:  441 QTC Calculation: 577 R Axis:   32 Text Interpretation:  Sinus tachycardia Atrial premature complexes  Nonspecific T abnrm, anterolateral leads Prolonged QT interval Baseline  wander in lead(s) V3 no significant interval change Confirmed by Johnney Killian,  MD, Jeannie Done [phone]) on 06/25/2015 2:13:04 PM     CRITICAL CARE Performed by: Charlesetta Shanks   Total critical care time: 30  Critical care time was exclusive of separately billable procedures and treating other patients.  Critical care was necessary to treat or prevent imminent or life-threatening deterioration.  Critical care was time spent personally by me on the following activities: development of treatment plan with patient and/or surrogate as well as nursing, discussions with consultants, evaluation of patient's response to treatment, examination of patient, obtaining history from patient or  surrogate, ordering and performing treatments and interventions, ordering and review of laboratory studies, ordering and review of radiographic studies, pulse oximetry and re-evaluation of patient's condition. MDM   Final diagnoses:  UTI (lower urinary tract infection)  Lymphoma  Weakness  Atrial fibrillation with RVR   Patient presents with symptoms on above. She is noted to have significant UTI. Patient is not hypotensive but was mildly tachycardic to 108. At this time symptoms are suspected to be secondary to UTI with possible early urosepsis in immunocompromised patient. Subsequent to the patient's evaluation and admission, she developed H of fibrillation with rapid ventricular response. The patient denied perceiving any symptoms in association with this. She did not endorse chest pain or awareness of palpitation. Blood pressure remained stable. At that time I did order Cardizem drip and bolus. Admitting physician was updated and admission bed changed to telemetry.    Charlesetta Shanks, MD 06/25/15 [phone]

## 2015-06-25 NOTE — Telephone Encounter (Signed)
FYI: Patient is currently in the ED due to recent fall. Patient son states that she is having increased weakness and difficulty eating. Message sent to MD Gorsuch/RN C[place].

## 2015-06-25 NOTE — Consult Note (Signed)
Reason for Consult: atrial fibrillation with RVR  Requesting Physician: Rama  Cardiologist: Burt Knack  HPI: This is a 74 y.o. female with a past medical history significant for mild left atrial dilatation, but without other major structural abnormalities by echocardiography and stress testing in 2015 and 2010, respectively. There was a questionable history of systemic hypertension, not confirmed by ambulatory blood pressure monitoring.  She is currently undergoing chemotherapy for follicular lymphoma (R-CHOP) and has had a lot of problems with diarrhea, poor oral intake of food and fluids and dehydration. She actually completed her last cycle of chemotherapy in mid August and showing excellent reduction in tumor burden. However, she continued to have poor appetite and poor oral intake and lost weight rapidly. She was hospitalized for several days last week for intravenous fluids and did well for one or 2 days after discharge. She subsequent became weaker and her oral intake declined. She has odynophagia. Her diarrhea seems to have resolved.  Today she presented with extreme weakness and was found to be extremely tachycardic with atrial fibrillation with rapid ventricular response at about 190 bpm. She was completely unaware of the arrhythmia. Laboratory tests show severely elevated renal parameters consistent with marked hypovolemia as well as hypernatremia and hypokalemia. She was administered intravenous fluids and intravenous diltiazem and after roughly 1 hour her rhythm converted back to normal sinus at about 90 bpm. She is feeling a little better. She did not complain of angina at any point during her acute illness. She has had some mild shortness of breath. She denies hematemesis, melanoma, history of intracranial bleeding or frequent falls and injuries. She has a small ecchymosis in the subxiphoid area where she fell against the corner of a table while bending over to pick something up.  She has another small ecchymosis on her right ankle where she hit the side of the bed.  Her cardiac history significantly negative for a firm diagnosis of systemic hypertension, she does not have diabetes mellitus or congestive heart failure and has never had a stroke/transient ischemic attack or other embolic events.  PMHx:  Past Medical History  Diagnosis Date  . DJD (degenerative joint disease)   . Hypothyroid   . Cervical spondylosis without myelopathy 10/25/2013  . Dental bridge present   . Follicular lymphoma grade 3a 02/03/2015  . Pneumonia   . Anemia     "years ago"  . Nausea without vomiting 06/20/2015  . Elevated troponin 02/18/2015  . Thrush of mouth and esophagus 06/25/2015  . Atrial fibrillation with rapid ventricular response 06/25/2015   Past Surgical History  Procedure Laterality Date  . Patelar tendon transplants      Left/right  . Abdominal hysterectomy    . Dilation and curettage of uterus    . Total knee arthroplasty  2011    Right  . Appendectomy    . Ovarian cyst resection    . Back surgery      X5-lumbar-fusion  . Lymph node biopsy Right 01/21/2015    Procedure: RIGHT GROIN LYMPH NODE BIOPSY;  Surgeon: Erroll Luna, MD;  Location: [facility];  Service: General;  Laterality: Right;  . Colonoscopy    . Portacath placement Right 02/13/2015    Procedure: INSERTION PORT-A-CATH WITH ULTRASOUND;  Surgeon: Erroll Luna, MD;  Location: [facility];  Service: General;  Laterality: Right;    FAMHx: Family History  Problem Relation Age of Onset  . Cancer Mother     Breast, lung NHL  .  Cancer Sister     Multiple myeloma    SOCHx:  reports that she has never smoked. She has never used smokeless tobacco. She reports that she drinks alcohol. She reports that she does not use illicit drugs.  ALLERGIES: No Known Allergies  ROS: Constitutional: Positive for weight loss and malaise/fatigue. Negative for fever and chills.  HENT: Negative for congestion, ear  pain, hearing loss and sore throat.  Eyes: Negative.  Respiratory: Positive for shortness of breath.   Dyspnea on exertion  Cardiovascular: Negative.  Gastrointestinal: Positive for heartburn, nausea, abdominal pain and diarrhea. Negative for vomiting, blood in stool and melena.  Genitourinary: Negative.  Musculoskeletal: Positive for myalgias and falls.  Skin: Negative.  Neurological: Positive for dizziness and weakness. Negative for headaches.  Endo/Heme/Allergies: Bruises/bleeds easily.   Cold intolerance.   HOME MEDICATIONS: Current Facility-Administered Medications on File Prior to Encounter  Medication Dose Route Frequency Provider Last Rate Last Dose  . ondansetron (ZOFRAN) 8 mg in sodium chloride 0.9 % 50 mL IVPB   Intravenous Once [name], MD       Current Outpatient Prescriptions on File Prior to Encounter  Medication Sig Dispense Refill  . acetaminophen (TYLENOL) 325 MG tablet Take 650 mg by mouth every 6 (six) hours as needed.    Marland Kitchen aspirin 81 MG tablet Take 81 mg by mouth daily.    . calcium carbonate (TUMS) 500 MG chewable tablet Chew 2 tablets (400 mg of elemental calcium total) by mouth daily. 2 tablet 0  . levothyroxine (SYNTHROID, LEVOTHROID) 100 MCG tablet Take 100 mcg by mouth daily.  9  . lidocaine-prilocaine (EMLA) cream Apply 1 application topically as needed. Apply to port a cath site one hour prior to needle stick 30 g 2  . loratadine (CLARITIN) 10 MG tablet Take 10 mg by mouth daily.    . Multiple Vitamins-Minerals (AIRBORNE GUMMIES PO) Take by mouth.    . ondansetron (ZOFRAN ODT) 8 MG disintegrating tablet Take 1 tablet (8 mg total) by mouth every 8 (eight) hours as needed for nausea or vomiting. 30 tablet 0  . predniSONE (DELTASONE) 20 MG tablet Take 3 tabs on days 2-5 every cycle of 21 days 12 tablet 1    HOSPITAL MEDICATIONS: I have reviewed the patient's current medications. Scheduled: . [START ON 06/26/2015] aspirin  81 mg Oral Daily    . calcium carbonate  2 tablet Oral Daily  . enoxaparin (LOVENOX) injection  30 mg Subcutaneous Q24H  . fluconazole (DIFLUCAN) IV  200 mg Intravenous Q24H  . [START ON 06/26/2015] levothyroxine  100 mcg Oral QAC breakfast  . loratadine  10 mg Oral Daily  . multivitamin with minerals  1 tablet Oral Daily  . nystatin  5 mL Oral QID  . [START ON 06/26/2015] pantoprazole  40 mg Oral Q0600   Continuous: . 0.9 % NaCl with KCl 40 mEq / L    . cefTRIAXone (ROCEPHIN)  IV    . diltiazem (CARDIZEM) infusion 15 mg/hr (06/25/15 1549)  . potassium chloride Stopped (06/25/15 1652)    VITALS: Blood pressure 139/75, pulse 108, temperature 97.5 F (36.4 C), temperature source Oral, resp. rate 20, SpO2 100 %.  PHYSICAL EXAM:  General: Slightly drowsy but very wake up, oriented x3, no distress, appears weak and chronically ill, alopecia post chemotherapy Head: no evidence of trauma, PERRL, EOMI, no exophtalmos or lid lag, no myxedema, no xanthelasma; normal ears, nose and oropharynx. No thrush is seen Neck: Flat jugular venous pulsations and no hepatojugular  reflux; brisk carotid pulses without delay and no carotid bruits Chest: clear to auscultation, no signs of consolidation by percussion or palpation, normal fremitus, symmetrical and full respiratory excursions Cardiovascular: normal position and quality of the apical impulse, regular rhythm, normal first heart sound and normal second heart sound, no rubs or gallops, no murmur Abdomen: no tenderness or distention, no masses by palpation, no abnormal pulsatility or arterial bruits, normal bowel sounds, no hepatosplenomegaly Extremities: no clubbing, cyanosis;  no edema; 2+ radial, ulnar and brachial pulses bilaterally; 2+ right femoral, posterior tibial and dorsalis pedis pulses; 2+ left femoral, posterior tibial and dorsalis pedis pulses; no subclavian or femoral bruits Neurological: grossly nonfocal   LABS  CBC  Recent Labs  06/25/15 1200  06/25/15 1633  WBC 19.2* 21.6*  HGB 11.7* 11.0*  HCT 36.8 34.4*  MCV 97.6 97.5  PLT 323 834   Basic Metabolic Panel  Recent Labs  06/25/15 1200  NA 150*  K 3.2*  CL 113*  CO2 24  GLUCOSE 187*  BUN 54*  CREATININE 2.51*  CALCIUM 8.5*   Liver Function Tests  Recent Labs  06/25/15 1215  AST 157*  ALT 106*  ALKPHOS 106  BILITOT 0.7  PROT 6.2*  ALBUMIN 2.8*     IMAGING: Dg Chest Port 1 View  06/25/2015   CLINICAL DATA:  Patient diagnosed with non-Hodgkin's lymphoma April, 2016. Weakness since 06/14/2015.  EXAM: PORTABLE CHEST - 1 VIEW  COMPARISON:  PET CT scan 04/18/2015. Single view of the chest 02/13/2015.  FINDINGS: Right IJ Port-A-Cath remains in place, unchanged. The lungs are clear. Heart size mildly enlarged. No pneumothorax or pleural effusion. No focal bony abnormality.  IMPRESSION: No acute disease.   Electronically Signed   By: Inge Rise M.D.   On: 06/25/2015 13:51    ECG: Atrial fibrillation rapid ventricular response but without other major abnormalities  TELEMETRY:  now back in normal sinus rhythm  I have personally reviewed the echocardiogram images from April 2016 and February 2015. Left atrium is indeed moderately dilated, but there is normal left ventricular systolic function and normal left ventricular size and wall thickness. There is at most equivocal evidence of left ventricular diastolic dysfunction in my opinion (lateral annulus tissue Doppler e' > 10 cm/s, medial annulus velocity was measured off axis). There was mild centrally directed mitral regurgitation.  IMPRESSION: 1. Atrial fibrillation with rapid ventricular response, triggered by severe hypovolemia and electrolyte imbalances, with underlying substrate of moderately dilated left atrium of uncertain etiology. The patient was unaware of palpitations and she may have had atrial fibrillation in the past, unknowingly. However, this may have been a one-time event related to her acute  illness.  I think it is premature to recommend full anticoagulation, although technically her embolic risk is elevated (CHADSVasc score 2 - age and gender only). In her current debilitated state, anticoagulants may be more of a liability than help. I would recommend treatment with rate control medications and a 30 day event monitor to establish the burden of atrial fibrillation. If further asymptomatic atrial fibrillation episodes are confirmed, full anticoagulation with a direct oral anticoagulant such as Eliquis or Xarelto is preferred over warfarin. I do not think she needs antiarrhythmics since her atrial fibrillation is not particularly symptomatic. These agents could also be more of a liability than a benefit in the setting of major electrolyte abnormalities.  Cardiotoxic effects of her chemotherapy are a possibility, but unlikely. It is not unreasonable to perform a limited echo for left  ventricular ejection fraction. She is on chronic thyroid supplementation and does not have clinical signs of thyrotoxicosis. Note normal free T4 in April of this year.  2. Severe hypovolemia with associated hyponatremia and hypokalemia, at least in part related to poor oral intake. She has odynophagia, most likely due to esophageal candidiasis. She has been prescribed intravenous fluconazole already.  3. Leukocytosis, possibly secondary to administered growth factors. Mild anemia postchemotherapy. Normal platelet count.  4. Recently completed chemotherapy for follicular lymphoma with good oncological response. She is still on oral steroids.  5. Malnutrition and hypoalbuminemia  RECOMMENDATION: 1. Telemetry while she receives intravenous rehydration and electrolytes are corrected 2. Oral diltiazem for rate control in case of recurrent arrhythmia. This should be cautiously dose since she is also at risk for hypotension 3. Withhold decision on chronic anticoagulation unless further arrhythmia is detected after  correction of metabolic imbalances 4. Follow up in clinic to discuss long-term anticoagulation as well as need for long-term treatment with rate control medications 5. Treatment for suspected esophageal candidiasis  Time Spent Directly with Patient: 60 minutes  Sanda Klein, MD, [facility] [phone] office [phone] pager   06/25/2015, 4:52 PM

## 2015-06-26 ENCOUNTER — Inpatient Hospital Stay (HOSPITAL_COMMUNITY): Payer: Medicare HMO

## 2015-06-26 DIAGNOSIS — C8230 Follicular lymphoma grade IIIa, unspecified site: Secondary | ICD-10-CM

## 2015-06-26 DIAGNOSIS — E860 Dehydration: Secondary | ICD-10-CM

## 2015-06-26 DIAGNOSIS — I248 Other forms of acute ischemic heart disease: Secondary | ICD-10-CM | POA: Diagnosis present

## 2015-06-26 DIAGNOSIS — A419 Sepsis, unspecified organism: Secondary | ICD-10-CM | POA: Diagnosis present

## 2015-06-26 DIAGNOSIS — I4891 Unspecified atrial fibrillation: Secondary | ICD-10-CM

## 2015-06-26 DIAGNOSIS — D72829 Elevated white blood cell count, unspecified: Secondary | ICD-10-CM

## 2015-06-26 DIAGNOSIS — R7881 Bacteremia: Secondary | ICD-10-CM | POA: Diagnosis present

## 2015-06-26 DIAGNOSIS — R000 Tachycardia, unspecified: Secondary | ICD-10-CM

## 2015-06-26 DIAGNOSIS — D6481 Anemia due to antineoplastic chemotherapy: Secondary | ICD-10-CM

## 2015-06-26 DIAGNOSIS — N390 Urinary tract infection, site not specified: Secondary | ICD-10-CM

## 2015-06-26 DIAGNOSIS — R531 Weakness: Secondary | ICD-10-CM

## 2015-06-26 DIAGNOSIS — N179 Acute kidney failure, unspecified: Secondary | ICD-10-CM

## 2015-06-26 LAB — CBC
HCT: 30.9 % — ABNORMAL LOW (ref 36.0–46.0)
Hemoglobin: 9.6 g/dL — ABNORMAL LOW (ref 12.0–15.0)
MCH: 30.7 pg (ref 26.0–34.0)
MCHC: 31.1 g/dL (ref 30.0–36.0)
MCV: 98.7 fL (ref 78.0–100.0)
Platelets: 322 10*3/uL (ref 150–400)
RBC: 3.13 MIL/uL — ABNORMAL LOW (ref 3.87–5.11)
RDW: 15.3 % (ref 11.5–15.5)
WBC: 18.2 10*3/uL — ABNORMAL HIGH (ref 4.0–10.5)

## 2015-06-26 LAB — TROPONIN I: Troponin I: 0.56 ng/mL (ref <0.031)

## 2015-06-26 LAB — TSH: TSH: 0.851 u[IU]/mL (ref 0.350–4.500)

## 2015-06-26 LAB — BASIC METABOLIC PANEL
Anion gap: 9 (ref 5–15)
BUN: 46 mg/dL — ABNORMAL HIGH (ref 6–20)
CO2: 25 mmol/L (ref 22–32)
Calcium: 8 mg/dL — ABNORMAL LOW (ref 8.9–10.3)
Chloride: 121 mmol/L — ABNORMAL HIGH (ref 101–111)
Creatinine, Ser: 2.29 mg/dL — ABNORMAL HIGH (ref 0.44–1.00)
GFR calc Af Amer: 23 mL/min — ABNORMAL LOW (ref >60)
GFR calc non Af Amer: 20 mL/min — ABNORMAL LOW (ref >60)
Glucose, Bld: 175 mg/dL — ABNORMAL HIGH (ref 65–99)
Potassium: 3.8 mmol/L (ref 3.5–5.1)
Sodium: 155 mmol/L — ABNORMAL HIGH (ref 135–145)

## 2015-06-26 LAB — HEMOGLOBIN A1C
Hgb A1c MFr Bld: 6.8 % — ABNORMAL HIGH (ref 4.8–5.6)
Mean Plasma Glucose: 148 mg/dL

## 2015-06-26 MED ORDER — PIPERACILLIN-TAZOBACTAM IN DEX 2-0.25 GM/50ML IV SOLN
2.2500 g | Freq: Four times a day (QID) | INTRAVENOUS | Status: DC
  Administered 2015-06-26 – 2015-06-27 (×5): 2.25 g via INTRAVENOUS
  Filled 2015-06-26 (×6): qty 50

## 2015-06-26 MED ORDER — SODIUM CHLORIDE 0.45 % IV SOLN
INTRAVENOUS | Status: DC
  Administered 2015-06-26 (×2): via INTRAVENOUS

## 2015-06-26 NOTE — Progress Notes (Signed)
  Echocardiogram 2D Echocardiogram limited has been performed.  Carrie [name] 06/26/2015, 1:44 PM

## 2015-06-26 NOTE — Progress Notes (Signed)
Progress Note   Carrie [name] JAS:[id number] DOB: 1940/11/26 DOA: 2015/07/03 PCP: Darlyn Chamber, MD   Brief Narrative:   Carrie [name] is an 74 y.o. female with a PMH of follicular lymphoma grade 3A, under the care of Dr. Alvy Bimler, originally diagnosed 01/21/15 with initiation of R-CHOP chemotherapy 02/17/15 s/p 6 cycles with last cycle given 06/02/15, with subsequent follow-up PET scan showing near complete response to treatment, who was admitted 2015/07/03 with chief complaint of weight loss, loss of appetite, nausea, and weakness. Upon initial evaluation in the ED, the patient was noted to have findings concerning for a UTI. A 12 lead EKG showed atrial fibrillation with RVR (no known history of same). The patient was placed on a Cardizem drip and cardiology consultation was requested. Blood cultures drawn in the ED are growing gram-negative rods.  Assessment/Plan:   Principal Problems:   Sepsis with gram-negative rod bacteremia/UTI - 2 sets of blood cultures drawn in the ED are growing gram-negative rods. Source likely urinary. Follow-up urine cultures. - Given 1 dose of Zosyn in the ED then placed on Rocephin. - We'll broaden antibiotics back to Zosyn until cultures/sensitivities finalized.   Acute kidney injury secondary to dehydration/hypernatremia - Hydrated overnight. Despite this, sodium higher. Will change IV fluids to hypotonic. Creatinine slightly improved.    Atrial fibrillation with RVR with demand ischemia - Cardizem drip ordered for rate control on admission, now on oral Cardizem 60 mg Q 8 hours. - Troponin slightly elevated consistent with demand ischemia. - TSH 0.851, WNL. - CHADS2-VASC score is 2 (high risk) with a 2.2% yearly risk of stroke, oral anticoagulation recommended. - Cardiology consultation performed Jul 03, 2015. Agree with holding off on anticoagulation. - Continue aspirin. Rate improved & converted back to NSR overnight. - F/U limited 2 D Echo, ordered by  cardiologist.  Active Problems:  Hypokalemia - Repleted.   Oral and esophageal thrush - Continue Diflucan and nystatin oral suspension.   Hypothyroidism - Continue home dose of Synthroid. TSH WNL.   Follicular lymphoma grade 3a - Dr. Alvy Bimler notified of the patient's admission and will consult on the patient while in the hospital.   Other fatigue - PT evaluation requested.   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   Anemia due to antineoplastic chemotherapy - 2 g drop in hemoglobin consistent with hemodilution after hydration.   Leukocytosis - Secondary to sepsis, continue antibiotics.   Nausea without vomiting - Antinausea medications ordered. - Continue PPI. - Advance diet to [place].   Hyperglycemia - Possibly related to recent steroids. Follow-up hemoglobin A1c.   DVT prophylaxis - Lovenox ordered.  Code Status: Full. Family Communication: [name] (daughter) updated at bedside. Disposition Plan: Home when stable, likely 2-3 days when patient's symptoms improved, renal failure resolved, and cultures/sensitivities back.   IV Access:    Porta Cath   Procedures and diagnostic studies:   Dg Chest Port 1 View  2015-07-03   CLINICAL DATA:  Patient diagnosed with non-Hodgkin's lymphoma April, 2016. Weakness since 06/14/2015.  EXAM: PORTABLE CHEST - 1 VIEW  COMPARISON:  PET CT scan 04/18/2015. Single view of the chest 02/13/2015.  FINDINGS: Right IJ Port-A-Cath remains in place, unchanged. The lungs are clear. Heart size mildly enlarged. No pneumothorax or pleural effusion. No focal bony abnormality.  IMPRESSION: No acute disease.   Electronically Signed   By: Inge Rise M.D.   On: 07-03-15 13:51     Medical Consultants:    Oncology: [name], MD  Cardiology: Sanda Klein, MD  Anti-Infectives:    Zosyn 06/25/15--->  Rocephin 06/25/15---> 06/26/15  Subjective:   Carrie [name] is more awake/alert.  She has a  dry cough.  No chest pain, N/V.  Still with poor appetite.  No palpitations or dyspnea.  Objective:    Filed Vitals:   06/26/15 0400 06/26/15 0405 06/26/15 0500 06/26/15 0600  BP: 102/55  128/67 128/67  Pulse: 102  96 88  Temp:  99.7 F (37.6 C)    TempSrc:  Oral    Resp: 28  22 26   Height:      Weight:   62.6 kg (138 lb 0.1 oz)   SpO2: 98%  99% 98%    Intake/Output Summary (Last 24 hours) at 06/26/15 0704 Last data filed at 06/26/15 0600  Gross per 24 hour  Intake   1760 ml  Output      0 ml  Net   1760 ml   Filed Weights   06/25/15 2000 06/26/15 0500  Weight: 63.9 kg (140 lb 14 oz) 62.6 kg (138 lb 0.1 oz)    Exam: Gen:  NAD Cardiovascular:  RRR, II/VI SEM Respiratory:  Lungs with a few crackles left base Gastrointestinal:  Abdomen soft, NT/ND, + BS Extremities:  No C/E/C   Data Reviewed:    Labs: Basic Metabolic Panel:  Recent Labs Lab 06/25/15 1200 06/25/15 1633 06/26/15 0615  NA 150*  --  155*  K 3.2*  --  3.8  CL 113*  --  121*  CO2 24  --  25  GLUCOSE 187*  --  175*  BUN 54*  --  46*  CREATININE 2.51* 2.44* 2.29*  CALCIUM 8.5*  --  8.0*   GFR Estimated Creatinine Clearance: 19.1 mL/min (by C-G formula based on Cr of 2.29). Liver Function Tests:  Recent Labs Lab 06/25/15 1215  AST 157*  ALT 106*  ALKPHOS 106  BILITOT 0.7  PROT 6.2*  ALBUMIN 2.8*   CBC:  Recent Labs Lab 06/25/15 1200 06/25/15 1633 06/26/15 0615  WBC 19.2* 21.6* 18.2*  HGB 11.7* 11.0* 9.6*  HCT 36.8 34.4* 30.9*  MCV 97.6 97.5 98.7  PLT 323 317 322   Cardiac Enzymes:  Recent Labs Lab 06/25/15 1633 06/25/15 2127 06/26/15 0615  TROPONINI 0.96* 0.69* 0.56*   CBG:  Recent Labs Lab 06/25/15 1431  GLUCAP 112*   Thyroid function studies:  Recent Labs  06/25/15 2256  TSH 0.851   Sepsis Labs:  Recent Labs Lab 06/25/15 1200 06/25/15 1240 06/25/15 1633 06/26/15 0615  WBC 19.2*  --  21.6* 18.2*  LATICACIDVEN  --  1.39  --   --     Microbiology Recent Results (from the past 240 hour(s))  Culture, blood (single)     Status: None (Preliminary result)   Collection Time: 06/25/15 12:00 PM  Result Value Ref Range Status   Specimen Description BLOOD CENTRAL LINE  Final   Special Requests BOTTLES DRAWN AEROBIC AND ANAEROBIC 5CC  Final   Culture  Setup Time   Final    GRAM NEGATIVE RODS IN BOTH AEROBIC AND ANAEROBIC BOTTLES CRITICAL RESULT CALLED TO, READ BACK BY AND VERIFIED WITH: S EARLY@[id number]  06/26/15 MKELLY Performed at [facility]    Culture PENDING  Incomplete   Report Status PENDING  Incomplete  MRSA PCR Screening     Status: None   Collection Time: 06/25/15  6:54 PM  Result Value Ref Range Status   MRSA by PCR NEGATIVE NEGATIVE Final  Comment:        The GeneXpert MRSA Assay (FDA approved for NASAL specimens only), is one component of a comprehensive MRSA colonization surveillance program. It is not intended to diagnose MRSA infection nor to guide or monitor treatment for MRSA infections.      Medications:   . aspirin  81 mg Oral Daily  . calcium carbonate  2 tablet Oral Daily  . cefTRIAXone (ROCEPHIN)  IV  1 g Intravenous Q24H  . diltiazem  60 mg Oral 3 times per day  . enoxaparin (LOVENOX) injection  30 mg Subcutaneous Q24H  . fluconazole (DIFLUCAN) IV  200 mg Intravenous Q24H  . levothyroxine  100 mcg Oral QAC breakfast  . loratadine  10 mg Oral Daily  . multivitamin with minerals  1 tablet Oral Daily  . nystatin  5 mL Oral QID  . pantoprazole  40 mg Oral Q0600   Continuous Infusions: . 0.9 % NaCl with KCl 40 mEq / L 100 mL/hr at 06/26/15 0600    Time spent: 35 minutes.  The patient is medically complex and requires high complexity decision making.    LOS: 1 day   Alle Difabio  [facility] Pager [phone]. If unable to reach me by pager, please call my cell phone at [phone].  *Please refer to [email or web address], password [email or web address] to get updated schedule on who will round on  this patient, as hospitalists switch teams weekly. If 7PM-7AM, please contact night-coverage at [email or web address], password [email or web address] for any overnight needs.  06/26/2015, 7:04 AM

## 2015-06-26 NOTE — Progress Notes (Signed)
Patient Name: Carrie [name] Date of Encounter: 06/26/2015     Principal Problem:   Sepsis Active Problems:   Hypothyroidism   Follicular lymphoma grade 3a   Other fatigue   Neuropathy due to chemotherapeutic drug   Dehydration   Anemia due to antineoplastic chemotherapy   Leukocytosis   Nausea without vomiting   Hyperglycemia   Acute kidney injury   UTI (lower urinary tract infection)   Hypernatremia   Thrush of mouth and esophagus   Paroxysmal a-fib   Atrial fibrillation with rapid ventricular response   Atrial fibrillation with RVR   Bacteremia   Demand ischemia    SUBJECTIVE  She has been maintaining NSR since seen by Dr. Loletha Grayer last night but did have one 3 second run of afib with RVR around 1am. Asymptomatic. She denies palpitation, CP or SOB.   CURRENT MEDS . aspirin  81 mg Oral Daily  . calcium carbonate  2 tablet Oral Daily  . diltiazem  60 mg Oral 3 times per day  . enoxaparin (LOVENOX) injection  30 mg Subcutaneous Q24H  . fluconazole (DIFLUCAN) IV  200 mg Intravenous Q24H  . levothyroxine  100 mcg Oral QAC breakfast  . loratadine  10 mg Oral Daily  . multivitamin with minerals  1 tablet Oral Daily  . nystatin  5 mL Oral QID  . pantoprazole  40 mg Oral Q0600  . piperacillin-tazobactam (ZOSYN)  IV  2.25 g Intravenous Q6H    OBJECTIVE  Filed Vitals:   06/26/15 0400 06/26/15 0405 06/26/15 0500 06/26/15 0600  BP: 102/55  128/67 128/67  Pulse: 102  96 88  Temp:  99.7 F (37.6 C)    TempSrc:  Oral    Resp: 28  22 26   Height:      Weight:   138 lb 0.1 oz (62.6 kg)   SpO2: 98%  99% 98%    Intake/Output Summary (Last 24 hours) at 06/26/15 0809 Last data filed at 06/26/15 0600  Gross per 24 hour  Intake   1760 ml  Output      0 ml  Net   1760 ml   Filed Weights   06/25/15 2000 06/26/15 0500  Weight: 140 lb 14 oz (63.9 kg) 138 lb 0.1 oz (62.6 kg)    PHYSICAL EXAM  General: oriented x3, no distress, appears weak and chronically ill, alopecia  post chemotherapy Head: no evidence of trauma, PERRL, EOMI, no exophtalmos or lid lag, no myxedema, no xanthelasma; normal ears, nose and oropharynx. No thrush is seen Neck: Flat jugular venous pulsations and no hepatojugular reflux; brisk carotid pulses without delay and no carotid bruits Chest: clear to auscultation, no signs of consolidation by percussion or palpation, normal fremitus, symmetrical and full respiratory excursions Cardiovascular: normal position and quality of the apical impulse, regular rhythm, normal first heart sound and normal second heart sound, no rubs or gallops, no murmur Abdomen: no tenderness or distention, no masses by palpation, no abnormal pulsatility or arterial bruits, normal bowel sounds, no hepatosplenomegaly Extremities: no clubbing, cyanosis; no edema; 2+ radial, ulnar and brachial pulses bilaterally; 2+ right femoral, posterior tibial and dorsalis pedis pulses; 2+ left femoral, posterior tibial and dorsalis pedis pulses; no subclavian or femoral bruits Neurological: grossly nonfocal  Accessory Clinical Findings  CBC  Recent Labs  06/25/15 1633 06/26/15 0615  WBC 21.6* 18.2*  HGB 11.0* 9.6*  HCT 34.4* 30.9*  MCV 97.5 98.7  PLT 317 573   Basic Metabolic Panel  Recent Labs  06/25/15 1200 06/25/15 1633 06/26/15 0615  NA 150*  --  155*  K 3.2*  --  3.8  CL 113*  --  121*  CO2 24  --  25  GLUCOSE 187*  --  175*  BUN 54*  --  46*  CREATININE 2.51* 2.44* 2.29*  CALCIUM 8.5*  --  8.0*   Liver Function Tests  Recent Labs  06/25/15 1215  AST 157*  ALT 106*  ALKPHOS 106  BILITOT 0.7  PROT 6.2*  ALBUMIN 2.8*   No results for input(s): LIPASE, AMYLASE in the last 72 hours. Cardiac Enzymes  Recent Labs  06/25/15 1633 06/25/15 2127 06/26/15 0615  TROPONINI 0.96* 0.69* 0.56*   BNP Invalid input(s): POCBNP D-Dimer No results for input(s): DDIMER in the last 72 hours. Hemoglobin A1C  Recent Labs  06/25/15 1215  HGBA1C 6.8*    Fasting Lipid Panel No results for input(s): CHOL, HDL, LDLCALC, TRIG, CHOLHDL, LDLDIRECT in the last 72 hours. Thyroid Function Tests  Recent Labs  06/25/15 2256  TSH 0.851    TELE  NSR with some PACs; one 3 second run of afib with RVR around 1am. HR in 80s currently.   Radiology/Studies  Dg Chest Port 1 View  06/25/2015   CLINICAL DATA:  Patient diagnosed with non-Hodgkin's lymphoma April, 2016. Weakness since 06/14/2015.  EXAM: PORTABLE CHEST - 1 VIEW  COMPARISON:  PET CT scan 04/18/2015. Single view of the chest 02/13/2015.  FINDINGS: Right IJ Port-A-Cath remains in place, unchanged. The lungs are clear. Heart size mildly enlarged. No pneumothorax or pleural effusion. No focal bony abnormality.  IMPRESSION: No acute disease.   Electronically Signed   By: Inge Rise M.D.   On: 06/25/2015 13:51    ASSESSMENT AND PLAN This is a 74 y.o. female with a past medical history significant for mild left atrial dilatation, follicular lymphoma ( on R-CHOP), recent poor PO intake and diarrhea who presented to [facility] on [facility]15 w/ extreme weakness and was found to have atrial fibrillation with RVR, hypovolemia, AKI and electrolyte imbalances.  Atrial fibrillation with rapid ventricular response, triggered by severe hypovolemia and electrolyte imbalances, with underlying substrate of moderately dilated left atrium of uncertain etiology. The patient was unaware of palpitations and she may have had atrial fibrillation in the past, unknowingly. However, this may have been a one-time event related to her acute illness.  -- Dr. Sallyanne Kuster felt it was premature to recommend full anticoagulation, although technically her embolic risk is elevated (CHADSVasc score 2 - age and gender only). In her current debilitated state, anticoagulants may be more of a liability than help. He recommended treatment with rate control medications and a 30 day event monitor to establish the burden of atrial fibrillation. If  further asymptomatic atrial fibrillation episodes are confirmed, full anticoagulation with a direct oral anticoagulant such as Eliquis or Xarelto is preferred over warfarin. No antiarrhythmics since her atrial fibrillation is not particularly symptomatic. These agents could also be more of a liability than a benefit in the setting of major electrolyte abnormalities. Cardiotoxic effects of her chemotherapy are a possibility, but unlikely. It is not unreasonable to perform a limited echo for left ventricular ejection fraction. -- She did have ~ 3sec run of afib with RVR last night but otherwise maintaining NSR -- Continue dilt 60mg  TID. BP stable -- TSH normal -- Limited ECHO to assess LV function pending -- If she continues to stay in NSR she will need outpatient monitor set up and follow up  in clinic to discuss long-term anticoagulation as well as need for long-term treatment with rate control medications.  Severe hypovolemia with associated hyponatremia and hypokalemia, at least in part related to poor oral intake. She has odynophagia, most likely due to esophageal candidiasis. She has been prescribed intravenous fluconazole already.  Leukocytosis/Bacteremia- Rapid blood cultures returned positive for Gram negative rods this morning which will require IV antibiotics. She was transferred to the SDU for monitoring due to septic status -- Placed on IV Zosyn by oncology  Recently completed chemotherapy for follicular lymphoma with good oncological response. She is still on oral steroids.  Malnutrition and hypoalbuminemia   Signed, Eileen Stanford PA-C  Pager [phone]  I have seen and examined the patient along with Angelena Form R PA-C.  I have reviewed the chart, notes and new data.  I agree with PA's note.  Key new complaints: feeling better, still has some odynophagia Key examination changes: more alert, RRR, frequent PACs Key new findings / data: improved K level and creatinine,  still markedly hypernatremic. Minor troponin release is not consistent with true acute coronary sd. , related to extreme tachycardia.  PLAN: Switch diltiazem to sustained release formulation. Outpatient 30 day event monitor. Start direct oral anticoagulant if atrial fibrillation is identified on monitor. Correct hyponatremia.   Sanda Klein, MD, [place] [phone] 06/26/2015, 1:40 PM

## 2015-06-26 NOTE — Progress Notes (Signed)
PT Cancellation Note  Patient Details Name: CRESSIE BETZLER MRN: [id number] DOB: November 09, 1940   Cancelled Treatment:    Reason Eval/Treat Not Completed: Patient not medically ready (would like medical status to be more stable for mobility. Trop tending down, patient asleep currently. RN aware. will see in AM.)   Claretha Cooper 06/26/2015, 3:28 PM Tresa Endo PT [phone]

## 2015-06-26 NOTE — Consult Note (Signed)
[place]  [phone]) [phone]   Requesting Provider: [facility]  Consulting Provider: [name], MD  Primary Oncologist: [name], MD  Patient Care Team: Arvella Nigh, MD as PCP - General (Obstetrics and Gynecology) Carola Frost, RN as Registered Nurse (Medical Oncology) [name] as Attending Physician (Internal Medicine)  HOSPITAL CONSULTATION  NOTE I have seen the patient, examined her and edited the notes as follows  Reason for Consultation: Follicular Lymphoma, acute renal failure, atrial fibrillation with rapid ventricular response, electrolyte imbalance and sepsis from urinary tract infection with possible bacteremia  HPI: Carrie [name] isa 74 year old woman with a history of follicular lymphoma as described below, on R-CHOP, admitted on 9/7 with profound weakness and tachycardia, developing rapid rate Atrial Fibrillation, despite being asymptomatic for tachycardia. She denied any  fevers, chills, night sweats, vision changes, or mucositis. She reported mild dyspnea at rest. Denies any chest pain, but did report dizziness. Denies lower extremity swelling. She had intermittent nausea, denies heartburn or change in bowel habits. She has been having issues with dehydration due to decreased oral intake in the setting of odynophagia, requiring IV fluids as outpatient. Denies any dysuria. Denies abnormal skin rashes, or neuropathy. She does bruise easily. Denies any bleeding issues such as epistaxis, hematemesis, hematuria or hematochezia. She has reported myalgias and falls.  Labs were consistent with acute renal failure, marked hypovolemia, hypernatremia and hypokalemia requiring rapid management. Rapid blood cultures returned positive for Gram negative rods this morning which will require IV antibiotics. She was transferred to the SDU for monitoring due to septic status. She is feeling better this morning. We have been kindly informed of the  patient's admission.      Follicular lymphoma grade 3a   01/21/2015 Surgery She underwent excisional lymph node biopsy that came back follicular lymphoma grade 3   01/21/2015 Pathology Results Accession: [id number] biopsy confirmed follicular lymphoma   1/85/6314 Imaging Echocardiogram showed ejection fraction of 55-60%   02/11/2015 Imaging  PET CT scan show possible splenic involvement and diffuse lymphadenopathy throughout   02/11/2015 Bone Marrow Biopsy  bone marrow biopsy was performed and is involved by lymphoma with translocation of igH/BCL2   02/13/2015 Procedure She had port placement.   02/17/2015 -  Chemotherapy She received R-CHOP chemo   02/17/2015 Adverse Reaction She had mild infusion reaction with cycle 1 of treatment.   04/18/2015 Imaging PET CT scan showed near complete response to treatment.   04/22/2015 Adverse Reaction Vincristine dose was reduced by 50% due to neuropathy from cycle 4 onwards    Past Medical History  Diagnosis Date  . DJD (degenerative joint disease)   . Hypothyroid   . Cervical spondylosis without myelopathy 10/25/2013  . Dental bridge present   . Follicular lymphoma grade 3a 02/03/2015  . Pneumonia   . Anemia     "years ago"  . Nausea without vomiting 06/20/2015  . Elevated troponin 02/18/2015  . Thrush of mouth and esophagus 06/25/2015  . Atrial fibrillation with rapid ventricular response 06/25/2015     MEDICATIONS:  Scheduled Meds: . aspirin  81 mg Oral Daily  . calcium carbonate  2 tablet Oral Daily  . diltiazem  60 mg Oral 3 times per day  . enoxaparin (LOVENOX) injection  30 mg Subcutaneous Q24H  . fluconazole (DIFLUCAN) IV  200 mg Intravenous Q24H  . levothyroxine  100 mcg Oral QAC breakfast  . loratadine  10 mg Oral Daily  . multivitamin with minerals  1  tablet Oral Daily  . nystatin  5 mL Oral QID  . pantoprazole  40 mg Oral Q0600  . piperacillin-tazobactam (ZOSYN)  IV  2.25 g Intravenous Q6H   Continuous Infusions: . sodium chloride     PRN  Meds:.acetaminophen **OR** acetaminophen, alum & mag hydroxide-simeth, ondansetron **OR** ondansetron (ZOFRAN) IV, oxyCODONE  ALLERGIES: No Known Allergies  Review of systems:   Constitutional: Denies fevers, chills or abnormal night sweats. She reports weight loss in the setting of poor oral intake.  Eyes: Denies blurriness of vision, double vision or watery eyes Ears, nose, mouth, throat, and face: Denies mucositis or sore throat Respiratory: Denies cough, dyspnea or wheezes. She does report mild shortness of breath.  Cardiovascular: Denies palpitation, chest discomfort or lower extremity swelling Gastrointestinal:  She reports intermittent nausea, heartburn. She had diarrhea prior to admission, none since then. Skin: Denies abnormal skin rashes Lymphatics: Denies new lymphadenopathy. Bruises easily Neurological:Denies numbness, tingling or new weaknesses Behavioral/Psych: Mood is stable, no new changes  All other systems were reviewed with the patient and are negative.    Family History  Problem Relation Age of Onset  . Cancer Mother     Breast, lung NHL  . Cancer Sister     Multiple myeloma     Past Surgical History  Procedure Laterality Date  . Patelar tendon transplants      Left/right  . Abdominal hysterectomy    . Dilation and curettage of uterus    . Total knee arthroplasty  2011    Right  . Appendectomy    . Ovarian cyst resection    . Back surgery      X5-lumbar-fusion  . Lymph node biopsy Right 01/21/2015    Procedure: RIGHT GROIN LYMPH NODE BIOPSY;  Surgeon: Erroll Luna, MD;  Location: [facility];  Service: General;  Laterality: Right;  . Colonoscopy    . Portacath placement Right 02/13/2015    Procedure: INSERTION PORT-A-CATH WITH ULTRASOUND;  Surgeon: Erroll Luna, MD;  Location: [facility];  Service: General;  Laterality: Right;    Social History   Social History  . Marital Status: Widowed    Spouse Name: N/A  . Number of Children: 2  .  Years of Education: hs   Occupational History  . Retired    Social History Main Topics  . Smoking status: Never Smoker   . Smokeless tobacco: Never Used  . Alcohol Use: Yes     Comment: occassional wine  . Drug Use: No  . Sexual Activity: Not on file   Other Topics Concern  . Not on file   Social History Narrative   Lives alone.  Has a walker for home use.     PHYSICAL EXAMINATION:  Filed Vitals:   06/26/15 0600  BP: 128/67  Pulse: 88  Temp:   Resp: 26      Intake/Output Summary (Last 24 hours) at 06/26/15 0830 Last data filed at 06/26/15 0600  Gross per 24 hour  Intake   1760 ml  Output      0 ml  Net   1760 ml    ECOG PERFORMANCE STATUS: 4 due to current bed bound status in SDU GENERAL:alert, no distress and comfortable, ill appearing SKIN: skin color, texture, turgor are normal, no rashes or significant lesions. Some scattered areas of bruising EYES: normal, conjunctiva are pink and non-injected, sclera clear OROPHARYNX:no exudate, no erythema and lips, buccal mucosa, and tongue with white coating. NECK: supple, thyroid normal size, non-tender, without  nodularity LYMPH:  no palpable lymphadenopathy in the cervical, axillary or inguinal LUNGS: clear to auscultation and percussion with normal breathing effort HEART: irregular rate & rhythm and no murmurs and 1+ lower extremity edema ABDOMEN: soft, non-tender and normal bowel sounds Musculoskeletal:no cyanosis of digits and no clubbing  PSYCH: alert & oriented x 3 with fluent speech although she appears to be lethargic compared to last week's examination. NEURO: no focal motor/sensory deficits  LABORATORY/RADIOLOGY DATA:   Recent Labs Lab 06/25/15 1200 06/25/15 1633 06/26/15 0615  WBC 19.2* 21.6* 18.2*  HGB 11.7* 11.0* 9.6*  HCT 36.8 34.4* 30.9*  PLT 323 317 322  MCV 97.6 97.5 98.7  MCH 31.0 31.2 30.7  MCHC 31.8 32.0 31.1  RDW 15.0 15.1 15.3    CMP    Recent Labs Lab 06/25/15 1200  06/25/15 1215 06/25/15 1633 06/26/15 0615  NA 150*  --   --  155*  K 3.2*  --   --  3.8  CL 113*  --   --  121*  CO2 24  --   --  25  GLUCOSE 187*  --   --  175*  BUN 54*  --   --  46*  CREATININE 2.51*  --  2.44* 2.29*  CALCIUM 8.5*  --   --  8.0*  AST  --  157*  --   --   ALT  --  106*  --   --   ALKPHOS  --  106  --   --   BILITOT  --  0.7  --   --         Component Value Date/Time   BILITOT 0.7 06/25/2015 1215   BILITOT 0.72 06/18/2015 1236   BILIDIR 0.1 06/25/2015 1215   IBILI 0.6 06/25/2015 1215    Recent Labs  06/25/15 2256  TSH 0.851    Urinalysis    Component Value Date/Time   COLORURINE YELLOW 06/25/2015 1234   APPEARANCEUR CLOUDY* 06/25/2015 1234   LABSPEC 1.014 06/25/2015 1234   PHURINE 5.5 06/25/2015 1234   GLUCOSEU NEGATIVE 06/25/2015 1234   HGBUR LARGE* 06/25/2015 1234   BILIRUBINUR NEGATIVE 06/25/2015 1234   KETONESUR NEGATIVE 06/25/2015 1234   PROTEINUR 100* 06/25/2015 1234   UROBILINOGEN 1.0 06/25/2015 1234   NITRITE POSITIVE* 06/25/2015 1234   LEUKOCYTESUR LARGE* 06/25/2015 1234    Liver Function Tests:  Recent Labs Lab 06/25/15 1215  AST 157*  ALT 106*  ALKPHOS 106  BILITOT 0.7  PROT 6.2*  ALBUMIN 2.8*    CBG:  Recent Labs Lab 06/25/15 1431  GLUCAP 112*    Cardiac Enzymes:  Recent Labs Lab 06/25/15 1633 06/25/15 2127 06/26/15 0615  TROPONINI 0.96* 0.69* 0.56*     Thyroid function studies  Recent Labs  06/25/15 2256  TSH 0.851    Radiology Studies:  Dg Chest Port 1 View  06/25/2015   CLINICAL DATA:  Patient diagnosed with non-Hodgkin's lymphoma April, 2016. Weakness since 06/14/2015.  EXAM: PORTABLE CHEST - 1 VIEW  COMPARISON:  PET CT scan 04/18/2015. Single view of the chest 02/13/2015.  FINDINGS: Right IJ Port-A-Cath remains in place, unchanged. The lungs are clear. Heart size mildly enlarged. No pneumothorax or pleural effusion. No focal bony abnormality.  IMPRESSION: No acute disease.   Electronically  Signed   By: Inge Rise M.D.   On: 06/25/2015 13:51   ASSESSMENT AND PLAN:  Follicular lymphoma grade 3a She has no signs of disease recurrence. Unfortunately, she developed significant complication from recent  treatment with profound dehydration and diarrhea requiring aggressive IV fluid resuscitation. She will continue supportive hydration while in hospital  Dehydration She has significant dehydration from recent diarrhea -now resolved-, complicated with acute renal failure during this admission Continue hydration as recommended by primary team.  Acute Kidney Injury Due to dehydration and hypernatremia She is receiving IV fluids and electrolyte replenishment with good response.  Atrial Fibrillation with Rapid Ventricular Response This is a new onset Appreciate Cardiology follow up She was placed on IV Cardizem, Metoprolol, now better rate controlled. Agree with 2 D echo.  Ritta Slot This is being treated with IV Diflucan and Nystatin.  Deconditioning PT/OT to see.  Leukocytosis with bacteremia and urinary tract infection This is related to recent G-CSF injection, and UTI infection, dehydration. Cultures are positive for gram negative rods  She is afebrile IV Zosyn initiated this morning We will observe for now If bacteremia does not improve with IV antibiotics, consider port removal  Anemia in neoplastic disease In the setting of dilution and infection, antibiotics No bleeding issues are present at this time Continue to monitor.   Malnutrition Consider Nutrition evaluation  DVT prophylaxis On Lovenox  Full Code Other medical issues including hyperglycemia, as per admitting team Will follow  [facility], PA-C 06/26/2015, 8:30 AM  [name], Carrie Friddle, MD 06/26/2015

## 2015-06-26 NOTE — Progress Notes (Signed)
ANTIBIOTIC CONSULT NOTE - INITIAL  Pharmacy Consult for Zosyn Indication: Bacteremia  No Known Allergies  Patient Measurements: Height: 5' 1.5" (156.2 cm) Weight: 138 lb 0.1 oz (62.6 kg) IBW/kg (Calculated) : 48.95  Vital Signs: Temp: 99.7 F (37.6 C) (09/08 0405) Temp Source: Oral (09/08 0405) BP: 128/67 mmHg (09/08 0600) Pulse Rate: 88 (09/08 0600) Intake/Output from previous day: 09/07 0701 - 09/08 0700 In: 1760 [P.O.:460; I.V.:1200; IV Piggyback:100] Out: -   Labs:  Recent Labs  06/25/15 1200 06/25/15 1633 06/26/15 0615  WBC 19.2* 21.6* 18.2*  HGB 11.7* 11.0* 9.6*  PLT 323 317 322  CREATININE 2.51* 2.44* 2.29*   Estimated Creatinine Clearance: 19.1 mL/min (by C-G formula based on Cr of 2.29). No results for input(s): VANCOTROUGH, VANCOPEAK, VANCORANDOM, GENTTROUGH, GENTPEAK, GENTRANDOM, TOBRATROUGH, TOBRAPEAK, TOBRARND, AMIKACINPEAK, AMIKACINTROU, AMIKACIN in the last 72 hours.   Microbiology: Recent Results (from the past 720 hour(s))  Culture, blood (single)     Status: None (Preliminary result)   Collection Time: 06/25/15 12:00 PM  Result Value Ref Range Status   Specimen Description BLOOD CENTRAL LINE  Final   Special Requests BOTTLES DRAWN AEROBIC AND ANAEROBIC 5CC  Final   Culture  Setup Time   Final    GRAM NEGATIVE RODS IN BOTH AEROBIC AND ANAEROBIC BOTTLES CRITICAL RESULT CALLED TO, READ BACK BY AND VERIFIED WITH: S EARLY@0518  06/26/15 MKELLY Performed at [facility]    Culture PENDING  Incomplete   Report Status PENDING  Incomplete  MRSA PCR Screening     Status: None   Collection Time: 06/25/15  6:54 PM  Result Value Ref Range Status   MRSA by PCR NEGATIVE NEGATIVE Final    Comment:        The GeneXpert MRSA Assay (FDA approved for NASAL specimens only), is one component of a comprehensive MRSA colonization surveillance program. It is not intended to diagnose MRSA infection nor to guide or monitor treatment for MRSA infections.     Medical History: Past Medical History  Diagnosis Date  . DJD (degenerative joint disease)   . Hypothyroid   . Cervical spondylosis without myelopathy 10/25/2013  . Dental bridge present   . Follicular lymphoma grade 3a 02/03/2015  . Pneumonia   . Anemia     "years ago"  . Nausea without vomiting 06/20/2015  . Elevated troponin 02/18/2015  . Thrush of mouth and esophagus 06/25/2015  . Atrial fibrillation with rapid ventricular response 06/25/2015    Assessment: 45 yoF presented to ED on 9/7 with weakness and PMH of follicular lymphoma s/p 6 cycles of R-CHOP chemo with last cycle on 06/02/15.  She c/o recent N/D, dizziness, weight loss.  ED workup for possible UTI, AKI, noted new afib with RVR.  She was initially given Zosyn in the ED x1 dose, then started on ceftriaxone daily.  She now has gram negative rods growing in blood cultures and Pharmacy is consulted to dose Zosyn.  9/7 >> Ceftriaxone >> 9/8 9/8 >> Zosyn >>    Today, 06/26/2015:  Tmax: afebrile  WBC 18.2  SCr 2.28 with CrCl ~ 19 ml/min CG   Goal of Therapy:  Appropriate abx dosing, eradication of infection.   Plan:   Zosyn 2.25g IV Q6H  Follow up renal fxn, culture results, and clinical course.  Gretta Arab PharmD, BCPS Pager [phone] 06/26/2015 8:42 AM

## 2015-06-26 NOTE — Progress Notes (Signed)
CRITICAL VALUE ALERT  Critical value received:  Gram negative rods in both blood culture bottles  Date of notification:  06/26/2015  Time of notification:  0518  Critical value read back:Yes.    Nurse who received alert:  Luther Parody, RN  MD notified (1st page):  K. Schorr  Time of first page:  0530  MD notified (2nd page):  Time of second page:  Responding MD:  Lamar Blinks  Time MD responded:

## 2015-06-27 ENCOUNTER — Other Ambulatory Visit: Payer: Self-pay | Admitting: Physician Assistant

## 2015-06-27 DIAGNOSIS — K5909 Other constipation: Secondary | ICD-10-CM | POA: Diagnosis present

## 2015-06-27 DIAGNOSIS — C8590 Non-Hodgkin lymphoma, unspecified, unspecified site: Secondary | ICD-10-CM | POA: Diagnosis present

## 2015-06-27 DIAGNOSIS — I480 Paroxysmal atrial fibrillation: Secondary | ICD-10-CM

## 2015-06-27 DIAGNOSIS — E119 Type 2 diabetes mellitus without complications: Secondary | ICD-10-CM

## 2015-06-27 DIAGNOSIS — A4151 Sepsis due to Escherichia coli [E. coli]: Principal | ICD-10-CM

## 2015-06-27 DIAGNOSIS — E0965 Drug or chemical induced diabetes mellitus with hyperglycemia: Secondary | ICD-10-CM

## 2015-06-27 LAB — CBC
HCT: 29.6 % — ABNORMAL LOW (ref 36.0–46.0)
Hemoglobin: 9.4 g/dL — ABNORMAL LOW (ref 12.0–15.0)
MCH: 31.6 pg (ref 26.0–34.0)
MCHC: 31.8 g/dL (ref 30.0–36.0)
MCV: 99.7 fL (ref 78.0–100.0)
Platelets: 278 10*3/uL (ref 150–400)
RBC: 2.97 MIL/uL — ABNORMAL LOW (ref 3.87–5.11)
RDW: 15.3 % (ref 11.5–15.5)
WBC: 16.1 10*3/uL — ABNORMAL HIGH (ref 4.0–10.5)

## 2015-06-27 LAB — URINE CULTURE: Culture: >=100000

## 2015-06-27 LAB — BASIC METABOLIC PANEL
Anion gap: 7 (ref 5–15)
BUN: 35 mg/dL — ABNORMAL HIGH (ref 6–20)
CO2: 26 mmol/L (ref 22–32)
Calcium: 7.4 mg/dL — ABNORMAL LOW (ref 8.9–10.3)
Chloride: 121 mmol/L — ABNORMAL HIGH (ref 101–111)
Creatinine, Ser: 1.86 mg/dL — ABNORMAL HIGH (ref 0.44–1.00)
GFR calc Af Amer: 30 mL/min — ABNORMAL LOW (ref >60)
GFR calc non Af Amer: 26 mL/min — ABNORMAL LOW (ref >60)
Glucose, Bld: 186 mg/dL — ABNORMAL HIGH (ref 65–99)
Potassium: 3.4 mmol/L — ABNORMAL LOW (ref 3.5–5.1)
Sodium: 154 mmol/L — ABNORMAL HIGH (ref 135–145)

## 2015-06-27 LAB — GLUCOSE, CAPILLARY
Glucose-Capillary: 216 mg/dL — ABNORMAL HIGH (ref 65–99)
Glucose-Capillary: 222 mg/dL — ABNORMAL HIGH (ref 65–99)
Glucose-Capillary: 150 mg/dL — ABNORMAL HIGH (ref 65–99)
Glucose-Capillary: 128 mg/dL — ABNORMAL HIGH (ref 65–99)

## 2015-06-27 MED ORDER — POTASSIUM CHLORIDE 2 MEQ/ML IV SOLN
INTRAVENOUS | Status: DC
  Administered 2015-06-27 – 2015-06-29 (×4): via INTRAVENOUS
  Filled 2015-06-27 (×12): qty 1000

## 2015-06-27 MED ORDER — INSULIN ASPART 100 UNIT/ML ~~LOC~~ SOLN: 0.0000 [IU] | Freq: Every day | SUBCUTANEOUS | Status: DC

## 2015-06-27 MED ORDER — ENSURE ENLIVE PO LIQD
237.0000 mL | Freq: Two times a day (BID) | ORAL | Status: DC
  Administered 2015-06-27 – 2015-06-30 (×3): 237 mL via ORAL

## 2015-06-27 MED ORDER — LIVING WELL WITH DIABETES BOOK
Freq: Once | Status: AC
Start: 2015-06-27 — End: 2015-06-27
  Administered 2015-06-27: 13:00:00
  Filled 2015-06-27: qty 1

## 2015-06-27 MED ORDER — CEFAZOLIN SODIUM 1-5 GM-% IV SOLN
1.0000 g | Freq: Two times a day (BID) | INTRAVENOUS | Status: DC
  Administered 2015-06-27 – 2015-07-01 (×9): 1 g via INTRAVENOUS
  Filled 2015-06-27 (×10): qty 50

## 2015-06-27 MED ORDER — INSULIN ASPART 100 UNIT/ML ~~LOC~~ SOLN
3.0000 [IU] | Freq: Three times a day (TID) | SUBCUTANEOUS | Status: DC
  Administered 2015-06-27 – 2015-07-06 (×30): 3 [IU] via SUBCUTANEOUS

## 2015-06-27 MED ORDER — SENNOSIDES-DOCUSATE SODIUM 8.6-50 MG PO TABS
2.0000 | ORAL_TABLET | Freq: Two times a day (BID) | ORAL | Status: DC
  Administered 2015-06-27: 2 via ORAL
  Filled 2015-06-27 (×4): qty 2

## 2015-06-27 MED ORDER — INSULIN ASPART 100 UNIT/ML ~~LOC~~ SOLN
0.0000 [IU] | Freq: Three times a day (TID) | SUBCUTANEOUS | Status: DC
  Administered 2015-06-27 (×2): 3 [IU] via SUBCUTANEOUS
  Administered 2015-06-27: 1 [IU] via SUBCUTANEOUS
  Administered 2015-06-28 (×2): 3 [IU] via SUBCUTANEOUS
  Administered 2015-06-28 – 2015-06-29 (×2): 2 [IU] via SUBCUTANEOUS
  Administered 2015-06-29: 3 [IU] via SUBCUTANEOUS
  Administered 2015-06-30: 2 [IU] via SUBCUTANEOUS
  Administered 2015-06-30: 1 [IU] via SUBCUTANEOUS
  Administered 2015-06-30 – 2015-07-01 (×2): 2 [IU] via SUBCUTANEOUS
  Administered 2015-07-01: 3 [IU] via SUBCUTANEOUS
  Administered 2015-07-02 – 2015-07-03 (×5): 1 [IU] via SUBCUTANEOUS
  Administered 2015-07-03 – 2015-07-05 (×4): 2 [IU] via SUBCUTANEOUS
  Administered 2015-07-05: 1 [IU] via SUBCUTANEOUS
  Administered 2015-07-06 (×2): 2 [IU] via SUBCUTANEOUS
  Administered 2015-07-06: 1 [IU] via SUBCUTANEOUS

## 2015-06-27 MED ORDER — DILTIAZEM HCL ER COATED BEADS 180 MG PO CP24
180.0000 mg | ORAL_CAPSULE | Freq: Every day | ORAL | Status: DC
  Administered 2015-06-27 – 2015-07-06 (×10): 180 mg via ORAL
  Filled 2015-06-27 (×10): qty 1

## 2015-06-27 NOTE — Progress Notes (Signed)
Carrie [name]   DOB:16-Nov-1940   HY#:[id number]   RSW#:[id number]  I have seen the patient, examined her and edited the notes as follows  Subjective: Patient seen and examined. She reports feeling better since admission. She denies any fever, chills or night sweats. She denies any shortness of breath. Denies any chest pain or palpitations. Denies dizziness. Denies lower extremity swelling. Denies nausea or vomiting. Reports constipation, last bowel movement on 9/6.Appetite improving. Denies any dysuria. Denies abnormal skin rashes, or neuropathy. She does bruise easily. Denies any bleeding issues such as epistaxis, hematemesis, hematuria or hematochezia. She wants to increase ambulation.    Scheduled Meds: . aspirin  81 mg Oral Daily  . calcium carbonate  2 tablet Oral Daily  . diltiazem  60 mg Oral 3 times per day  . enoxaparin (LOVENOX) injection  30 mg Subcutaneous Q24H  . fluconazole (DIFLUCAN) IV  200 mg Intravenous Q24H  . levothyroxine  100 mcg Oral QAC breakfast  . loratadine  10 mg Oral Daily  . multivitamin with minerals  1 tablet Oral Daily  . nystatin  5 mL Oral QID  . pantoprazole  40 mg Oral Q0600  . piperacillin-tazobactam (ZOSYN)  IV  2.25 g Intravenous Q6H   Continuous Infusions: . dextrose 5 % with kcl     PRN Meds:.acetaminophen **OR** acetaminophen, alum & mag hydroxide-simeth, ondansetron **OR** ondansetron (ZOFRAN) IV, oxyCODONE  Objective:  Filed Vitals:   06/27/15 0400  BP: 139/73  Pulse: 76  Temp: 98.9 F (37.2 C)  Resp: 27    Body mass index is 25.66 kg/(m^2).  Intake/Output Summary (Last 24 hours) at 06/27/15 0716 Last data filed at 06/27/15 0600  Gross per 24 hour  Intake 1775.83 ml  Output   1235 ml  Net 540.83 ml    GENERAL: alert, no distress and comfortable SKIN: skin color, texture, turgor are normal, no rashes or significant lesions. Some scattered areas of bruising EYES: normal, conjunctiva are pink and non-injected, sclera  clear OROPHARYNX: no exudate, no erythema and lips, buccal mucosa, no thrush NECK: supple, thyroid normal size, non-tender, without nodularity LYMPH: no palpable lymphadenopathy in the cervical, axillary or inguinal LUNGS: clear to auscultation and percussion with normal breathing effort HEART: irregular rate & rhythm and no murmurs and 1+ lower extremity edema ABDOMEN: soft, non-tender and normal bowel sounds Musculoskeletal:no cyanosis of digits and no clubbing  PSYCH: alert & oriented x 3 with fluent speech   NEURO: no focal motor/sensory deficits   CBG (last 3)   Recent Labs  06/25/15 1431  GLUCAP 112*      Labs:   Recent Labs Lab 06/25/15 1200 06/25/15 1633 06/26/15 0615 06/27/15 0500  WBC 19.2* 21.6* 18.2* 16.1*  HGB 11.7* 11.0* 9.6* 9.4*  HCT 36.8 34.4* 30.9* 29.6*  PLT 323 317 322 278  MCV 97.6 97.5 98.7 99.7  MCH 31.0 31.2 30.7 31.6  MCHC 31.8 32.0 31.1 31.8  RDW 15.0 15.1 15.3 15.3     Chemistries:    Recent Labs Lab 06/25/15 1200 06/25/15 1215 06/25/15 1633 06/26/15 0615 06/27/15 0500  NA 150*  --   --  155* 154*  K 3.2*  --   --  3.8 3.4*  CL 113*  --   --  121* 121*  CO2 24  --   --  25 26  GLUCOSE 187*  --   --  175* 186*  BUN 54*  --   --  46* 35*  CREATININE 2.51*  --  2.44* 2.29* 1.86*  CALCIUM 8.5*  --   --  8.0* 7.4*  AST  --  157*  --   --   --   ALT  --  106*  --   --   --   ALKPHOS  --  106  --   --   --   BILITOT  --  0.7  --   --   --      Liver Function Tests:  Recent Labs Lab 06/25/15 1215  AST 157*  ALT 106*  ALKPHOS 106  BILITOT 0.7  PROT 6.2*  ALBUMIN 2.8*   Microbiology Blood Cultures with gram negative rods Urine culture 9/7 positive for E Coli  Studies:  2 D Echo 9/8  Study Conclusions  - Left ventricle: Inferobasal akinesis. The cavity size was normal. Wall thickness was normal. Systolic function was normal. Theestimated ejection fraction was 55%.  Wall motion was normal; There were no  regional wall motion abnormalities. - Aortic valve: There was mild regurgitation. - Mitral valve: There was mild regurgitation. - Left atrium: The atrium was mildly dilated.  Assessment / Plan:   Follicular lymphoma grade 3a She has no signs of disease recurrence. Unfortunately, she developed significant complication from recent treatment with profound dehydration and diarrhea requiring aggressive IV fluid resuscitation. She will continue supportive care  Dehydration She has significant dehydration from recent diarrhea -now resolved-, complicated with acute renal failure during this admission Continue hydration as recommended by primary team.  Acute Kidney Injury Due to dehydration and hypernatremia She is receiving IV fluids and electrolyte replenishment with good response.  Atrial Fibrillation with Rapid Ventricular Response This is a new onset She was placed on IV Cardizem, Metoprolol, now better rate controlled. 2 D echo on 9/8 without regional wall motion abnormalities, EF 55%. Appreciate Cardiology follow up  [place], resolved This was treated with IV Diflucan and Nystatin.  Deconditioning Appreciate PT/OT involvement  Leukocytosis with bacteremia and urinary tract infection This is related to recent G-CSF injection, and UTI infection, dehydration. Cultures are positive for  E Coli in urine She is afebrile IV Zosyn initiated on 9/8 Leukocytosis is improving We will observe for now If bacteremia does not improve with IV antibiotics, consider port removal  Anemia in neoplastic disease In the setting of dilution and infection, antibiotics No bleeding issues are present at this time Continue to monitor.   Malnutrition Consider Nutrition evaluation  Constipation This is due to decreased oral intake, decreased mobility, dehydration, hypernatremia Recommend start on a Laxative regimen, last bowel movement on 9/6 Will add sennokot  DVT prophylaxis On  Lovenox  Full Code Other medical issues including hyperglycemia, as per admitting team Will follow   Elease Hashimoto 06/27/2015  7:16 AM Medical Oncology and Hematology [place], [place], MD 06/27/2015

## 2015-06-27 NOTE — Progress Notes (Signed)
Initial Nutrition Assessment  DOCUMENTATION CODES:   Not applicable  INTERVENTION:  Ensure Enlive po BID, each supplement provides 350 kcal and 20 grams of protein    NUTRITION DIAGNOSIS:   Predicted suboptimal nutrient intake related to catabolic illness as evidenced by estimated needs.    GOAL:   Patient will meet greater than or equal to 90% of their needs    MONITOR:   PO intake, Supplement acceptance, Labs, Skin, I & O's  REASON FOR ASSESSMENT:   Consult Assessment of nutrition requirement/status  ASSESSMENT:   74 yo female with PMH of STG 3A Lymphoma, received 6 cycles of chemo, follow up pet scan found near complete response to treatment. Presented to cancer center with weakness 8/31. Received IV fluids inpatient for 3 days, d/c, still felt weak. Admitted on 9/7 for weakness, family reports abdominal pan;  Receiving treatment for UTI, also newly diagnosed diabetic and AFib. Pt did not appear malnourished following NPFE, reports she eats regularly, ok appetite, no wt loss. Pt is very positive, pleasant, polite. Also educated on T2DM, pt was eager and responsive. Will provide EEx2 to help with UTI. Follow for supplement acceptance.    Diet Order:  Diet Carb Modified Fluid consistency:: Thin; Room service appropriate?: Yes  Skin:  Reviewed, no issues  Last BM:  06/27/2015  Height:   Ht Readings from Last 1 Encounters:  06/25/15 5' 1.5" (1.562 m)    Weight:   Wt Readings from Last 1 Encounters:  06/26/15 138 lb 0.1 oz (62.6 kg)    Ideal Body Weight:  47.72 kg  BMI:  Body mass index is 25.66 kg/(m^2).  Estimated Nutritional Needs:   Kcal:  1600-1800  Protein:  75-90  Fluid:  >=/ 1.6L  EDUCATION NEEDS:   Education needs addressed   Satira Anis. Evadene Wardrip, MS, RD LDN After Hours/Weekend Pager [phone]

## 2015-06-27 NOTE — Progress Notes (Signed)
Inpatient Diabetes Program Recommendations  AACE/ADA: New Consensus Statement on Inpatient Glycemic Control (2013)  Target Ranges:  Prepandial:   less than 140 mg/dL      Peak postprandial:   less than 180 mg/dL (1-2 hours)      Critically ill patients:  140 - 180 mg/dL   Reason for Visit: consult for new-onset DM.  Inpatient Diabetes Program Recommendations HgbA1C: =6.8 will need f/u with PCP for management  Note: This coordinator met with patient and daughter to discuss A1C=6.8 and lifestyle changes as appropriate considering her current illness.  Pt states her late husband had diabetes and she is "familiar with it".  Living Well with Diabetes patient ed workbook and DM videos have been ordered.  Also, pt was given a Diabetes Meal Planning Guide to take home.  No further questions/concerns at the end of our visit. Thank you  Raoul Pitch BSN, RN,CDE Inpatient Diabetes Coordinator [phone] (team pager)

## 2015-06-27 NOTE — Progress Notes (Signed)
Progress Note   Carrie [name] MOQ:[id number] DOB: [birth year] DOA: Jul 16, 2015 PCP: Darlyn Chamber, MD   Brief Narrative:   Carrie [name] is an 74 y.o. female with a PMH of follicular lymphoma grade 3A, under the care of Dr. Alvy Bimler, originally diagnosed 01/21/15 with initiation of R-CHOP chemotherapy 02/17/15 s/p 6 cycles with last cycle given 06/02/15, with subsequent follow-up PET scan showing near complete response to treatment, who was admitted 07/16/15 with chief complaint of weight loss, loss of appetite, nausea, and weakness. Upon initial evaluation in the ED, the patient was noted to have findings concerning for a UTI. A 12 lead EKG showed atrial fibrillation with RVR (no known history of same). The patient was placed on a Cardizem drip and cardiology consultation was requested. Blood cultures drawn in the ED are growing gram-negative rods.  Assessment/Plan:   Principal Problems:   Sepsis with gram-negative rod bacteremia/ E. Coli UTI - 2 sets of blood cultures drawn in the ED are growing gram-negative rods. Source likely urinary. - Continue Zosyn while awaiting blood culture results.  Urine culture + E. Coli.   Acute kidney injury secondary to dehydration/hypernatremia - Change IV fluids to D5W. Creatinine improved. Still hypernatremic.    Atrial fibrillation with RVR with demand ischemia - Cardizem drip ordered for rate control on admission, now on oral Cardizem 60 mg Q 8 hours. - Troponin slightly elevated consistent with demand ischemia. - TSH 0.851, WNL. - CHADS2-VASC score is 2 (high risk) with a 2.2% yearly risk of stroke, oral anticoagulation recommended. - Cardiology consultation performed 07-16-2015. Agree with holding off on anticoagulation. - Continue aspirin. Rate improved & converted back to NSR overnight. - Limited 2 D Echo shows EF 55% with no regional wall motion abnormalities.  Active Problems:  Hypokalemia - Add potassium to IV fluids.   Oral and esophageal  thrush - Continue Diflucan and nystatin oral suspension.   Hypothyroidism - Continue home dose of Synthroid. TSH WNL.   Follicular lymphoma grade 3a - Dr. Alvy Bimler following.   Other fatigue - PT evaluation requested.   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   Anemia due to antineoplastic chemotherapy - 2 g drop in hemoglobin consistent with hemodilution after hydration. Hemoglobin stable after initial drop.   Leukocytosis - Secondary to sepsis, continue antibiotics.   Nausea without vomiting - Antinausea medications ordered. - Continue PPI. - Advance diet to carb modified.   Hyperglycemia/new diagnosis diabetes - Hemoglobin A1c 6.8%, consistent with diabetes. - Start SSI. Insulin sensitive scale with 3 units for meal coverage. - Diabetes coordinator to see for diabetes education, dietitian consultation for diet education.   DVT prophylaxis - Lovenox ordered.  Code Status: Full. Family Communication: [name] (daughter) updated at bedside. Disposition Plan: Home when stable, likely 2 days when patient's symptoms improved, renal failure resolved, and cultures/sensitivities back.   IV Access:    Porta Cath   Procedures and diagnostic studies:   Dg Chest Port 1 View  07-16-15   CLINICAL DATA:  Patient diagnosed with non-Hodgkin's lymphoma April, 2016. Weakness since 06/14/2015.  EXAM: PORTABLE CHEST - 1 VIEW  COMPARISON:  PET CT scan 04/18/2015. Single view of the chest 02/13/2015.  FINDINGS: Right IJ Port-A-Cath remains in place, unchanged. The lungs are clear. Heart size mildly enlarged. No pneumothorax or pleural effusion. No focal bony abnormality.  IMPRESSION: No acute disease.   Electronically Signed   By: Inge Rise M.D.   On: 16-Jul-2015 13:51  Medical Consultants:    Oncology: [name], MD  Cardiology: Sanda Klein, MD  Anti-Infectives:    Zosyn 06/25/15--->  Rocephin 06/25/15---> 06/26/15  Subjective:    Carrie [name] reports some constipation and weakness.  No dyspnea, occasional cough.    Objective:    Filed Vitals:   06/26/15 2300 06/27/15 0000 06/27/15 0200 06/27/15 0400  BP:  107/58 115/65 139/73  Pulse:  81 73 76  Temp: 99 F (37.2 C)   98.9 F (37.2 C)  TempSrc: Oral     Resp:  25 22 27   Height:      Weight:      SpO2:  94% 97% 97%    Intake/Output Summary (Last 24 hours) at 06/27/15 0710 Last data filed at 06/27/15 0600  Gross per 24 hour  Intake 1775.83 ml  Output   1235 ml  Net 540.83 ml   Filed Weights   06/25/15 2000 06/26/15 0500  Weight: 63.9 kg (140 lb 14 oz) 62.6 kg (138 lb 0.1 oz)    Exam: Gen:  NAD Cardiovascular:  RRR, II/VI SEM Respiratory:  Lungs diminished Gastrointestinal:  Abdomen soft, NT/ND, + BS Extremities:  No C/E/C   Data Reviewed:    Labs: Basic Metabolic Panel:  Recent Labs Lab 06/25/15 1200 06/25/15 1633 06/26/15 0615 06/27/15 0500  NA 150*  --  155* 154*  K 3.2*  --  3.8 3.4*  CL 113*  --  121* 121*  CO2 24  --  25 26  GLUCOSE 187*  --  175* 186*  BUN 54*  --  46* 35*  CREATININE 2.51* 2.44* 2.29* 1.86*  CALCIUM 8.5*  --  8.0* 7.4*   GFR Estimated Creatinine Clearance: 23.5 mL/min (by C-G formula based on Cr of 1.86). Liver Function Tests:  Recent Labs Lab 06/25/15 1215  AST 157*  ALT 106*  ALKPHOS 106  BILITOT 0.7  PROT 6.2*  ALBUMIN 2.8*   CBC:  Recent Labs Lab 06/25/15 1200 06/25/15 1633 06/26/15 0615 06/27/15 0500  WBC 19.2* 21.6* 18.2* 16.1*  HGB 11.7* 11.0* 9.6* 9.4*  HCT 36.8 34.4* 30.9* 29.6*  MCV 97.6 97.5 98.7 99.7  PLT 323 317 322 278   Cardiac Enzymes:  Recent Labs Lab 06/25/15 1633 06/25/15 2127 06/26/15 0615  TROPONINI 0.96* 0.69* 0.56*   CBG:  Recent Labs Lab 06/25/15 1431  GLUCAP 112*   Thyroid function studies:  Recent Labs  06/25/15 2256  TSH 0.851   Sepsis Labs:  Recent Labs Lab 06/25/15 1200 06/25/15 1240 06/25/15 1633 06/26/15 0615  06/27/15 0500  WBC 19.2*  --  21.6* 18.2* 16.1*  LATICACIDVEN  --  1.39  --   --   --    Microbiology Recent Results (from the past 240 hour(s))  Culture, blood (single)     Status: None (Preliminary result)   Collection Time: 06/25/15 12:00 PM  Result Value Ref Range Status   Specimen Description BLOOD CENTRAL LINE  Final   Special Requests BOTTLES DRAWN AEROBIC AND ANAEROBIC 5CC  Final   Culture  Setup Time   Final    GRAM NEGATIVE RODS IN BOTH AEROBIC AND ANAEROBIC BOTTLES CRITICAL RESULT CALLED TO, READ BACK BY AND VERIFIED WITH: S EARLY@0518  06/26/15 MKELLY    Culture   Final    NO GROWTH < 24 HOURS Performed at [facility]    Report Status PENDING  Incomplete  Culture, Urine     Status: None (Preliminary result)   Collection Time: 06/25/15  3:35 PM  Result Value Ref Range Status   Specimen Description URINE, CLEAN CATCH  Final   Special Requests NONE  Final   Culture   Final    >=100,000 COLONIES/mL ESCHERICHIA COLI Performed at [facility]    Report Status PENDING  Incomplete  MRSA PCR Screening     Status: None   Collection Time: 06/25/15  6:54 PM  Result Value Ref Range Status   MRSA by PCR NEGATIVE NEGATIVE Final    Comment:        The GeneXpert MRSA Assay (FDA approved for NASAL specimens only), is one component of a comprehensive MRSA colonization surveillance program. It is not intended to diagnose MRSA infection nor to guide or monitor treatment for MRSA infections.      Medications:   . aspirin  81 mg Oral Daily  . calcium carbonate  2 tablet Oral Daily  . diltiazem  60 mg Oral 3 times per day  . enoxaparin (LOVENOX) injection  30 mg Subcutaneous Q24H  . fluconazole (DIFLUCAN) IV  200 mg Intravenous Q24H  . levothyroxine  100 mcg Oral QAC breakfast  . loratadine  10 mg Oral Daily  . multivitamin with minerals  1 tablet Oral Daily  . nystatin  5 mL Oral QID  . pantoprazole  40 mg Oral Q0600  . piperacillin-tazobactam (ZOSYN)   IV  2.25 g Intravenous Q6H   Continuous Infusions: . sodium chloride 125 mL/hr at 06/26/15 2023    Time spent: 35 minutes.  The patient is medically complex and requires high complexity decision making.    LOS: 2 days   RAMA,CHRISTINA  [facility] Pager [phone]. If unable to reach me by pager, please call my cell phone at [phone].  *Please refer to [email or web address], password [email or web address] to get updated schedule on who will round on this patient, as hospitalists switch teams weekly. If 7PM-7AM, please contact night-coverage at [email or web address], password [email or web address] for any overnight needs.  06/27/2015, 7:10 AM

## 2015-06-27 NOTE — Evaluation (Signed)
Physical Therapy Evaluation Patient Details Name: Carrie [name] MRN: [id number] DOB: 12-16-1940 Today's Date: 06/27/2015   History of Present Illness  Carrie [name] is an 74 y.o. female with a PMH of follicular lymphoma grade 3A, last chemo  cycle given 06/02/15, with subsequent follow-up PET scan showing near complete response to treatment. She was evaluated at the cancer center on 06/18/15 secondary to diarrhea, dizziness and mild nausea. She has not had much of an appetite since then. She was documented to have lost 5 pounds in a few days. She received IV fluids at the cancer center on 8/31, 9/1, 9/2 and 9/3 but despite this, she has continued to feel weak. She has had abdominal discomfort/dyspepsia per her family, but denies any specific symptoms. Upon initial evaluation in the ED 06/25/15,  the patient was noted to have findings concerning for a UTI. A 12 lead EKG showed atrial fibrillation with RVR (no known history of same).   Clinical Impression  Patient is very pleasant, ambulated x 140' with RW. Dauf=ghter present. Patient will benefit from PT to address problems listed in note below.    Follow Up Recommendations  (did mention snf rehab if felt  was needed. patient declined wanting Rehab. duaghter stated family will stay.)    Equipment Recommendations  Rolling walker with 5" wheels    Recommendations for Other Services       Precautions / Restrictions Precautions Precautions: Fall Precaution Comments: monitor HR, urinary frequency and  incontinence Restrictions Weight Bearing Restrictions: No      Mobility  Bed Mobility Overal bed mobility: Needs Assistance Bed Mobility: Supine to Sit     Supine to sit: Min assist     General bed mobility comments: assist with trunk to upright  Transfers Overall transfer level: Needs assistance Equipment used: Rolling walker (2 wheeled) Transfers: Sit to/from Stand Sit to Stand: Min assist         General transfer comment:  cues for safety  Ambulation/Gait Ambulation/Gait assistance: Min assist Ambulation Distance (Feet): 140 Feet Assistive device: Rolling walker (2 wheeled) Gait Pattern/deviations: Step-through pattern     General Gait Details: cues for safety, some assist to guide RW around turns. sats 100%, HR remained in 80's  Stairs            Wheelchair Mobility    Modified Rankin (Stroke Patients Only)       Balance Overall balance assessment: Needs assistance Sitting-balance support: Feet supported;Bilateral upper extremity supported Sitting balance-Leahy Scale: Fair     Standing balance support: During functional activity;Bilateral upper extremity supported Standing balance-Leahy Scale: Fair                               Pertinent Vitals/Pain Pain Assessment: No/denies pain    Home Living Family/patient expects to be discharged to:: Private residence Living Arrangements: Alone Available Help at Discharge: Family;Friend(s) Type of Home: House Home Access: Ramped entrance     Home Layout: One level Home Equipment: Environmental consultant - 2 wheels      Prior Function Level of Independence: Independent         Comments: duaghter and son in law have stayed recenttly     Hand Dominance   Dominant Hand: Right    Extremity/Trunk Assessment   Upper Extremity Assessment: Generalized weakness           Lower Extremity Assessment: Generalized weakness         Communication  Communication:  (speech is different, difficult to describe-child-like.)  Cognition Arousal/Alertness: Awake/alert Behavior During Therapy: WFL for tasks assessed/performed;Impulsive Overall Cognitive Status: Impaired/Different from baseline Area of Impairment: Following commands;Safety/judgement;Awareness;Problem solving       Following Commands: Follows one step commands with increased time Safety/Judgement: Decreased awareness of safety   Problem Solving: Requires verbal  cues General Comments: requires redirection    General Comments      Exercises        Assessment/Plan    PT Assessment Patient needs continued PT services  PT Diagnosis Difficulty walking;Generalized weakness   PT Problem List Decreased strength;Decreased activity tolerance;Decreased mobility;Decreased cognition;Decreased knowledge of use of DME;Decreased safety awareness  PT Treatment Interventions DME instruction;Gait training;Functional mobility training;Therapeutic activities;Patient/family education   PT Goals (Current goals can be found in the Care Plan section) Acute Rehab PT Goals Patient Stated Goal: to return home PT Goal Formulation: With patient/family Time For Goal Achievement: 07/11/15 Potential to Achieve Goals: Good    Frequency Min 3X/week   Barriers to discharge        Co-evaluation               End of Session Equipment Utilized During Treatment: Gait belt Activity Tolerance: Patient tolerated treatment well Patient left: in chair;with call bell/phone within reach;with chair alarm set;with family/visitor present Nurse Communication: Mobility status         Time: 3846-6599 PT Time Calculation (min) (ACUTE ONLY): 27 min   Charges:   PT Evaluation $Initial PT Evaluation Tier I: 1 Procedure PT Treatments $Gait Training: 8-22 mins   PT G Codes:        Claretha Cooper 06/27/2015, 10:59 AM Tresa Endo PT [phone]

## 2015-06-27 NOTE — Progress Notes (Signed)
Patient Name: Carrie [name] Date of Encounter: 06/27/2015  Primary Cardiologist: Dr. Burt Knack   Principal Problem:   Sepsis Active Problems:   Hypothyroidism   Follicular lymphoma grade 3a   Other fatigue   Neuropathy due to chemotherapeutic drug   Dehydration   Anemia due to antineoplastic chemotherapy   Leukocytosis   Nausea without vomiting   Hyperglycemia   Acute kidney injury   UTI (lower urinary tract infection)   Hypernatremia   Thrush of mouth and esophagus   Paroxysmal a-fib   Atrial fibrillation with rapid ventricular response   Atrial fibrillation with RVR   Bacteremia   Demand ischemia   Diabetes mellitus, likely due to steroids   Lymphoma   Other constipation    SUBJECTIVE  Denies any CP prior to arrival this time. Feeling good, ambulated. No SOB.   CURRENT MEDS . aspirin  81 mg Oral Daily  . calcium carbonate  2 tablet Oral Daily  . diltiazem  60 mg Oral 3 times per day  . enoxaparin (LOVENOX) injection  30 mg Subcutaneous Q24H  . feeding supplement (ENSURE ENLIVE)  237 mL Oral BID BM  . fluconazole (DIFLUCAN) IV  200 mg Intravenous Q24H  . insulin aspart  0-5 Units Subcutaneous QHS  . insulin aspart  0-9 Units Subcutaneous TID WC  . insulin aspart  3 Units Subcutaneous TID WC  . levothyroxine  100 mcg Oral QAC breakfast  . loratadine  10 mg Oral Daily  . multivitamin with minerals  1 tablet Oral Daily  . nystatin  5 mL Oral QID  . pantoprazole  40 mg Oral Q0600  . piperacillin-tazobactam (ZOSYN)  IV  2.25 g Intravenous Q6H  . senna-docusate  2 tablet Oral BID    OBJECTIVE  Filed Vitals:   06/27/15 0200 06/27/15 0400 06/27/15 0800 06/27/15 1010  BP: 115/65 139/73  124/79  Pulse: 73 76 80 80  Temp:  98.9 F (37.2 C) 97.3 F (36.3 C)   TempSrc:   Oral   Resp: 22 27 20 22   Height:      Weight:      SpO2: 97% 97% 100% 100%    Intake/Output Summary (Last 24 hours) at 06/27/15 1058 Last data filed at 06/27/15 0600  Gross per 24 hour    Intake 1775.83 ml  Output   1035 ml  Net 740.83 ml   Filed Weights   06/25/15 2000 06/26/15 0500  Weight: 140 lb 14 oz (63.9 kg) 138 lb 0.1 oz (62.6 kg)    PHYSICAL EXAM  General: Pleasant, NAD. Neuro: Alert and oriented X 3. Moves all extremities spontaneously. Psych: Normal affect. HEENT:  Normal  Neck: Supple without bruits or JVD. Lungs:  Resp regular and unlabored. Largely CTA, R basilar rale. Heart: RRR no s3, s4, or murmurs. Abdomen: Soft, non-tender, non-distended, BS + x 4.  Extremities: No clubbing, cyanosis or edema. DP/PT/Radials 2+ and equal bilaterally.  Accessory Clinical Findings  CBC  Recent Labs  06/26/15 0615 06/27/15 0500  WBC 18.2* 16.1*  HGB 9.6* 9.4*  HCT 30.9* 29.6*  MCV 98.7 99.7  PLT 322 174   Basic Metabolic Panel  Recent Labs  06/26/15 0615 06/27/15 0500  NA 155* 154*  K 3.8 3.4*  CL 121* 121*  CO2 25 26  GLUCOSE 175* 186*  BUN 46* 35*  CREATININE 2.29* 1.86*  CALCIUM 8.0* 7.4*   Liver Function Tests  Recent Labs  06/25/15 1215  AST 157*  ALT 106*  ALKPHOS  106  BILITOT 0.7  PROT 6.2*  ALBUMIN 2.8*   Cardiac Enzymes  Recent Labs  06/25/15 1633 06/25/15 2127 06/26/15 0615  TROPONINI 0.96* 0.69* 0.56*   Hemoglobin A1C  Recent Labs  06/25/15 1215  HGBA1C 6.8*   Thyroid Function Tests  Recent Labs  06/25/15 2256  TSH 0.851    TELE NSR with HR 70-90s    ECG  No new EKG  Echocardiogram 06/26/2015  LV EF: 55%  ------------------------------------------------------------------- Indications:   Atrial fibrillation - 427.31.  ------------------------------------------------------------------- History:  PMH: Hx of A fib and RVR.  ------------------------------------------------------------------- Study Conclusions  - Left ventricle: Inferobasal akinesis. The cavity size was normal. Wall thickness was normal. Systolic function was normal. The estimated ejection fraction was 55%. Wall  motion was normal; there were no regional wall motion abnormalities. - Aortic valve: There was mild regurgitation. - Mitral valve: There was mild regurgitation. - Left atrium: The atrium was mildly dilated.    Radiology/Studies  Dg Chest Port 1 View  06/25/2015   CLINICAL DATA:  Patient diagnosed with non-Hodgkin's lymphoma April, 2016. Weakness since 06/14/2015.  EXAM: PORTABLE CHEST - 1 VIEW  COMPARISON:  PET CT scan 04/18/2015. Single view of the chest 02/13/2015.  FINDINGS: Right IJ Port-A-Cath remains in place, unchanged. The lungs are clear. Heart size mildly enlarged. No pneumothorax or pleural effusion. No focal bony abnormality.  IMPRESSION: No acute disease.   Electronically Signed   By: Inge Rise M.D.   On: 06/25/2015 13:51    ASSESSMENT AND PLAN  This is a 74 y.o. female with a past medical history significant for mild left atrial dilatation, follicular lymphoma ( on R-CHOP), recent poor PO intake and diarrhea who presented to [facility] on 06/25/15 w/ extreme weakness and was found to have atrial fibrillation with RVR, hypovolemia, AKI and electrolyte imbalances.  1. Newly diagnosed PAF with RVR converted spontaneously on IV fluid and diltiazem  - CHADSVasc score 2 - age and gender only  - Echo 06/26/2015 EF 55%, inferobasal akinesis, mild MR/AR  - continue rate control, maintaining sinus rhythm, will arrange outpatient 30 day event monitor, if has recurrent a-fib, then will consider systemic anticoagulation.   - consolidate diltiazem to 180mg  daily today  2. Severe hypovolemia with associated hyponatremia and hypokalemia 2/2 poor oral intake +/- odynopagia with esophageal candidiasis  3. Leukocytosis/bacteremia with UTI: blood culture gram gram negative rod  4. Follicular lymphoma s/p completed chemotherapy since mid Aug  - has been having lot of problems with diarrhea, poor oral intake of food and fluids and dehydration  5. Elevated trop: peaked at 0.9 trending down, likely  from demand ischemia 2/2 afib with RVR, dehydration, anemia and AKI. Will hold off on ischemic workup at this time given normal EF on echo  6. AKI; baseline Cr 1.1, Cr on arrival 2.5, now 1.86 this morning.   Hilbert Corrigan PA-C Pager: [phone]  I have seen and examined the patient along with Almyra Deforest PA-C.  I have reviewed the chart, notes and new data.  I agree with PA's note.   PLAN: Renal function improving, but electrolyte abnormalities remain an issue. No new arrhythmia and no new recommendations. Cardiology will be available over the weekend for questions. Please let us know if she is discharged to arrange the outpatient arrhythmia monitor.  Sanda Klein, MD, [place] [phone] 06/27/2015, 1:45 PM

## 2015-06-28 DIAGNOSIS — E1165 Type 2 diabetes mellitus with hyperglycemia: Secondary | ICD-10-CM

## 2015-06-28 LAB — GLUCOSE, CAPILLARY
Glucose-Capillary: 230 mg/dL — ABNORMAL HIGH (ref 65–99)
Glucose-Capillary: 225 mg/dL — ABNORMAL HIGH (ref 65–99)
Glucose-Capillary: 159 mg/dL — ABNORMAL HIGH (ref 65–99)
Glucose-Capillary: 145 mg/dL — ABNORMAL HIGH (ref 65–99)

## 2015-06-28 LAB — CBC
HCT: 32.9 % — ABNORMAL LOW (ref 36.0–46.0)
Hemoglobin: 10.3 g/dL — ABNORMAL LOW (ref 12.0–15.0)
MCH: 31 pg (ref 26.0–34.0)
MCHC: 31.3 g/dL (ref 30.0–36.0)
MCV: 99.1 fL (ref 78.0–100.0)
Platelets: 320 10*3/uL (ref 150–400)
RBC: 3.32 MIL/uL — ABNORMAL LOW (ref 3.87–5.11)
RDW: 15.4 % (ref 11.5–15.5)
WBC: 14.8 10*3/uL — ABNORMAL HIGH (ref 4.0–10.5)

## 2015-06-28 LAB — BASIC METABOLIC PANEL
Anion gap: 7 (ref 5–15)
BUN: 25 mg/dL — ABNORMAL HIGH (ref 6–20)
CO2: 25 mmol/L (ref 22–32)
Calcium: 7.6 mg/dL — ABNORMAL LOW (ref 8.9–10.3)
Chloride: 117 mmol/L — ABNORMAL HIGH (ref 101–111)
Creatinine, Ser: 1.67 mg/dL — ABNORMAL HIGH (ref 0.44–1.00)
GFR calc Af Amer: 34 mL/min — ABNORMAL LOW (ref >60)
GFR calc non Af Amer: 30 mL/min — ABNORMAL LOW (ref >60)
Glucose, Bld: 230 mg/dL — ABNORMAL HIGH (ref 65–99)
Potassium: 4.2 mmol/L (ref 3.5–5.1)
Sodium: 149 mmol/L — ABNORMAL HIGH (ref 135–145)

## 2015-06-28 MED ORDER — SODIUM CHLORIDE 0.9 % IJ SOLN
10.0000 mL | INTRAMUSCULAR | Status: DC | PRN
  Administered 2015-06-28 – 2015-07-06 (×5): 10 mL
  Filled 2015-06-28 (×4): qty 40

## 2015-06-28 MED ORDER — MAGIC MOUTHWASH W/LIDOCAINE
10.0000 mL | Freq: Four times a day (QID) | ORAL | Status: DC | PRN
  Administered 2015-06-28 – 2015-06-29 (×2): 10 mL via ORAL
  Filled 2015-06-28 (×4): qty 10

## 2015-06-28 MED ORDER — SENNOSIDES-DOCUSATE SODIUM 8.6-50 MG PO TABS: 1.0000 | ORAL_TABLET | Freq: Every evening | ORAL | Status: DC | PRN

## 2015-06-28 NOTE — Progress Notes (Signed)
Progress Note   Carrie [name] CVE:[id number] DOB: 07-24-1941 DOA: 06/25/2015 PCP: Darlyn Chamber, MD   Brief Narrative:   Carrie [name] is an 74 y.o. female with a PMH of follicular lymphoma grade 3A, under the care of Dr. Alvy Bimler, originally diagnosed 01/21/15 with initiation of R-CHOP chemotherapy 02/17/15 s/p 6 cycles with last cycle given 06/02/15, with subsequent follow-up PET scan showing near complete response to treatment, who was admitted 06/25/15 with chief complaint of weight loss, loss of appetite, nausea, and weakness. Upon initial evaluation in the ED, the patient was noted to have findings concerning for a UTI. A 12 lead EKG showed atrial fibrillation with RVR (no known history of same). The patient was placed on a Cardizem drip and cardiology consultation was requested. Blood cultures drawn in the ED are growing gram-negative rods.  Assessment/Plan:   Principal Problems:   Sepsis with gram-negative rod bacteremia/ E. Coli UTI - Blood and urine cultures positive for Escherichia coli, antibiotics narrowed from Zosyn to Ancef 06/27/15.   Acute kidney injury secondary to dehydration/hypernatremia - Continue hypotonic IV fluids. Creatinine improved. Still hypernatremic, but sodium improving.    Atrial fibrillation with RVR with demand ischemia - Cardizem drip ordered for rate control on admission, now on oral Cardizem 60 mg Q 8 hours. - Troponin slightly elevated consistent with demand ischemia. - TSH 0.851, WNL. - CHADS2-VASC score is 2 (high risk) with a 2.2% yearly risk of stroke, oral anticoagulation typically recommended. - Cardiology consultation performed 06/25/15. Agree with holding off on anticoagulation as risk outweighs benefit. - Continue aspirin. Rate improved & converted back to NSR overnight. - Limited 2 D Echo shows EF 55% with no regional wall motion abnormalities.  Active Problems:  Hypokalemia - Resolved with supplementation.   Oral and esophageal  thrush - Continue Diflucan and nystatin oral suspension.   Hypothyroidism - Continue home dose of Synthroid. TSH WNL.   Follicular lymphoma grade 3a - Dr. Alvy Bimler following.   Other fatigue - PT evaluation requested.   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   Anemia due to antineoplastic chemotherapy - 2 g drop in hemoglobin consistent with hemodilution after hydration. Hemoglobin stable after initial drop.   Leukocytosis - Secondary to sepsis, continue antibiotics.   Nausea without vomiting - Antinausea medications ordered. - Continue PPI. - Advance diet to carb modified.   Hyperglycemia/new diagnosis diabetes - Hemoglobin A1c 6.8%, consistent with diabetes. - Currently being managed with SSI, Insulin sensitive scale with 3 units for meal coverage. CBGs 128-230. - Seen and counseled by Diabetes coordinator and dietitian.   DVT prophylaxis - Lovenox ordered.  Code Status: Full. Family Communication: Glorianne Manchester (daughter) updated at bedside 06/27/15. Disposition Plan: Home when stable, likely 06/30/15.  IV Access:    Porta Cath   Procedures and diagnostic studies:   No results found.   Medical Consultants:    Oncology: [name], MD  Cardiology: Sanda Klein, MD  Anti-Infectives:    Zosyn 06/25/15--->  Rocephin 06/25/15---> 06/26/15  Subjective:   Carrie [name] reports that her bowels moved yesterday 3 times.  No nausea or vomiting. Mouth/esophageal discomfort improved, but still a bit sore.  Still weak.  Appetite improving.  Objective:    Filed Vitals:   06/27/15 1200 06/27/15 1442 06/27/15 2129 06/28/15 0615  BP:  119/63 139/82 158/78  Pulse:  77 85 80  Temp: 97.3 F (36.3 C) 97.6 F (36.4 C) 98.4 F (36.9 C) 97.4 F (36.3 C)  TempSrc: Rectal Oral Oral Oral  Resp:  18 18 18   Height:      Weight:      SpO2:  96% 96% 95%    Intake/Output Summary (Last 24 hours) at 06/28/15 0751 Last data filed at  06/28/15 0544  Gross per 24 hour  Intake 2226.67 ml  Output      0 ml  Net 2226.67 ml   Filed Weights   06/25/15 2000 06/26/15 0500  Weight: 63.9 kg (140 lb 14 oz) 62.6 kg (138 lb 0.1 oz)    Exam: Gen:  NAD Cardiovascular:  RRR, II/VI SEM Respiratory:  Lungs diminished Gastrointestinal:  Abdomen soft, NT/ND, + BS Extremities:  No C/E/C   Data Reviewed:    Labs: Basic Metabolic Panel:  Recent Labs Lab 06/25/15 1200 06/25/15 1633 06/26/15 0615 06/27/15 0500 06/28/15 0544  NA 150*  --  155* 154* 149*  K 3.2*  --  3.8 3.4* 4.2  CL 113*  --  121* 121* 117*  CO2 24  --  25 26 25   GLUCOSE 187*  --  175* 186* 230*  BUN 54*  --  46* 35* 25*  CREATININE 2.51* 2.44* 2.29* 1.86* 1.67*  CALCIUM 8.5*  --  8.0* 7.4* 7.6*   GFR Estimated Creatinine Clearance: 26.2 mL/min (by C-G formula based on Cr of 1.67). Liver Function Tests:  Recent Labs Lab 06/25/15 1215  AST 157*  ALT 106*  ALKPHOS 106  BILITOT 0.7  PROT 6.2*  ALBUMIN 2.8*   CBC:  Recent Labs Lab 06/25/15 1200 06/25/15 1633 06/26/15 0615 06/27/15 0500 06/28/15 0544  WBC 19.2* 21.6* 18.2* 16.1* 14.8*  HGB 11.7* 11.0* 9.6* 9.4* 10.3*  HCT 36.8 34.4* 30.9* 29.6* 32.9*  MCV 97.6 97.5 98.7 99.7 99.1  PLT 323 317 322 278 320   Cardiac Enzymes:  Recent Labs Lab 06/25/15 1633 06/25/15 2127 06/26/15 0615  TROPONINI 0.96* 0.69* 0.56*   CBG:  Recent Labs Lab 06/27/15 0814 06/27/15 1204 06/27/15 1642 06/27/15 2224 06/28/15 0736  GLUCAP 216* 222* 150* 128* 230*   Thyroid function studies:  Recent Labs  06/25/15 2256  TSH 0.851   Sepsis Labs:  Recent Labs Lab 06/25/15 1240 06/25/15 1633 06/26/15 0615 06/27/15 0500 06/28/15 0544  WBC  --  21.6* 18.2* 16.1* 14.8*  LATICACIDVEN 1.39  --   --   --   --    Microbiology Recent Results (from the past 240 hour(s))  Culture, blood (single)     Status: None (Preliminary result)   Collection Time: 06/25/15 12:00 PM  Result Value Ref Range  Status   Specimen Description BLOOD CENTRAL LINE  Final   Special Requests BOTTLES DRAWN AEROBIC AND ANAEROBIC 5CC  Final   Culture  Setup Time   Final    GRAM NEGATIVE RODS IN BOTH AEROBIC AND ANAEROBIC BOTTLES CRITICAL RESULT CALLED TO, READ BACK BY AND VERIFIED WITH: S EARLY@0518  06/26/15 MKELLY    Culture   Final    ESCHERICHIA COLI Performed at [facility]    Report Status PENDING  Incomplete  Culture, Urine     Status: None   Collection Time: 06/25/15  3:35 PM  Result Value Ref Range Status   Specimen Description URINE, CLEAN CATCH  Final   Special Requests NONE  Final   Culture   Final    >=100,000 COLONIES/mL ESCHERICHIA COLI Performed at [facility]    Report Status 06/27/2015 FINAL  Final   Organism ID, Bacteria ESCHERICHIA COLI  Final      Susceptibility   Escherichia coli - MIC*    AMPICILLIN >=32 RESISTANT Resistant     CEFAZOLIN <=4 SENSITIVE Sensitive     CEFTRIAXONE <=1 SENSITIVE Sensitive     CIPROFLOXACIN <=0.25 SENSITIVE Sensitive     GENTAMICIN <=1 SENSITIVE Sensitive     IMIPENEM <=0.25 SENSITIVE Sensitive     NITROFURANTOIN <=16 SENSITIVE Sensitive     TRIMETH/SULFA <=20 SENSITIVE Sensitive     AMPICILLIN/SULBACTAM >=32 RESISTANT Resistant     PIP/TAZO <=4 SENSITIVE Sensitive     * >=100,000 COLONIES/mL ESCHERICHIA COLI  MRSA PCR Screening     Status: None   Collection Time: 06/25/15  6:54 PM  Result Value Ref Range Status   MRSA by PCR NEGATIVE NEGATIVE Final    Comment:        The GeneXpert MRSA Assay (FDA approved for NASAL specimens only), is one component of a comprehensive MRSA colonization surveillance program. It is not intended to diagnose MRSA infection nor to guide or monitor treatment for MRSA infections.      Medications:   . aspirin  81 mg Oral Daily  . calcium carbonate  2 tablet Oral Daily  .  ceFAZolin (ANCEF) IV  1 g Intravenous Q12H  . diltiazem  180 mg Oral Daily  . enoxaparin (LOVENOX) injection  30  mg Subcutaneous Q24H  . feeding supplement (ENSURE ENLIVE)  237 mL Oral BID BM  . fluconazole (DIFLUCAN) IV  200 mg Intravenous Q24H  . insulin aspart  0-5 Units Subcutaneous QHS  . insulin aspart  0-9 Units Subcutaneous TID WC  . insulin aspart  3 Units Subcutaneous TID WC  . levothyroxine  100 mcg Oral QAC breakfast  . loratadine  10 mg Oral Daily  . multivitamin with minerals  1 tablet Oral Daily  . nystatin  5 mL Oral QID  . pantoprazole  40 mg Oral Q0600   Continuous Infusions: . dextrose 5 % with kcl 100 mL/hr at 06/27/15 2206    Time spent: 35 minutes.  The patient is medically complex and requires high complexity decision making.    LOS: 3 days   [facility] Pager [phone]. If unable to reach me by pager, please call my cell phone at [phone].  *Please refer to [email or web address], password [email or web address] to get updated schedule on who will round on this patient, as hospitalists switch teams weekly. If 7PM-7AM, please contact night-coverage at [email or web address], password [email or web address] for any overnight needs.  06/28/2015, 7:51 AM

## 2015-06-28 NOTE — Clinical Social Work Note (Signed)
CSW received a call from RN that pt family wanted to meet with her for possible SNF  CSW reviewed PT evaluation that does not have a recommendation so PT was paged for clarity.  Awaiting a response from PT.  RN was also provided with PT evaluation for clarity.  RN will follow up with MD  CSW met with pt and family who stated that they are interested in SNF  CSW will send pt information out through [employer]finders[facility] and provide pt and family with responses.  Pt's son Richardson Landry lives in town and is POA  Pt's daughter Urban Gibson lives out of town and is POA  CSW will follow up with Richardson Landry regarding SNF bed offers  .Dede Query, LCSW [facility] Clinical Social Worker - Weekend Coverage cell #: [phone]

## 2015-06-28 NOTE — Progress Notes (Signed)
Carrie [name]   DOB:[birth year]   TI#:[id number]    Subjective: Patient seen and examined. Transferred out of ICU on 9/9. Multiple soft stools this morning. Able to tolerate normal diet. No fevers or chills  Objective:  Filed Vitals:   06/28/15 0615  BP: 158/78  Pulse: 80  Temp: 97.4 F (36.3 C)  Resp: 18     Intake/Output Summary (Last 24 hours) at 06/28/15 0651 Last data filed at 06/28/15 0544  Gross per 24 hour  Intake 2226.67 ml  Output      0 ml  Net 2226.67 ml    GENERAL:alert, no distress and comfortable SKIN: skin color, texture, turgor are normal, no rashes or significant lesions EYES: normal, Conjunctiva are pink and non-injected, sclera clear OROPHARYNX:no exudate, no erythema and lips, buccal mucosa, and tongue normal  NECK: supple, thyroid normal size, non-tender, without nodularity LYMPH:  no palpable lymphadenopathy in the cervical, axillary or inguinal LUNGS: clear to auscultation and percussion with normal breathing effort HEART: regular rate & rhythm and no murmurs and no lower extremity edema ABDOMEN:abdomen soft, non-tender and normal bowel sounds Musculoskeletal:no cyanosis of digits and no clubbing  NEURO: alert & oriented x 3 with fluent speech, no focal motor/sensory deficits   Labs:  Lab Results  Component Value Date   WBC 14.8* 06/28/2015   HGB 10.3* 06/28/2015   HCT 32.9* 06/28/2015   MCV 99.1 06/28/2015   PLT 320 06/28/2015   NEUTROABS 14.5* 06/18/2015    Lab Results  Component Value Date   NA 149* 06/28/2015   K 4.2 06/28/2015   CL 117* 06/28/2015   CO2 25 06/28/2015   Assessment & Plan:   Follicular lymphoma grade 3a She has no signs of disease recurrence. Unfortunately, she developed significant complication from recent treatment with profound dehydration and diarrhea requiring aggressive IV fluid resuscitation. She will continue supportive care  Dehydration, improving She has significant dehydration from recent diarrhea -now  resolved-, complicated with acute renal failure during this admission Continue hydration as recommended by primary team.  Acute Kidney Injury, resolving Due to dehydration and hypernatremia She is receiving IV fluids and electrolyte replenishment with good response.  Atrial Fibrillation with Rapid Ventricular Response, resolving This is a new onset She was placed on IV Cardizem, Metoprolol, now better rate controlled. 2 D echo on 9/8 without regional wall motion abnormalities, EF 55%. Appreciate Cardiology follow up  Chamizal, resolved This was treated with IV Diflucan and Nystatin.  Deconditioning Appreciate PT/OT involvement  Leukocytosis with bacteremia and urinary tract infection, improving This is related to recent G-CSF injection, and UTI infection, dehydration. Cultures are positive for E Coli in urine She is afebrile IV Zosyn initiated on 9/8 Leukocytosis is improving We will observe for now If bacteremia does not improve with IV antibiotics, consider port removal  Anemia in neoplastic disease In the setting of dilution and infection, antibiotics No bleeding issues are present at this time Continue to monitor.   Malnutrition Consider Nutrition evaluation  Constipation, resolved This is due to decreased oral intake, decreased mobility, dehydration, hypernatremia I will change sennokot to prn  DVT prophylaxis On Lovenox  Full Code Other medical issues including hyperglycemia, as per admitting team Will follow Hopefully DC early next week  [facility], Zacharee Gaddie, MD 06/28/2015  6:51 AM  Sostenes Kauffmann, MD 06/28/2015

## 2015-06-28 NOTE — Clinical Social Work Note (Signed)
PT staff Yong Channel called CSW back and CSW explained the need for PT to provide recommendation for pt  PT will meet with pt tomorrow to provide PT recommendation  .Dede Query, LCSW [facility] Clinical Social Worker - Weekend Coverage cell #: [phone]

## 2015-06-29 DIAGNOSIS — R4702 Dysphasia: Secondary | ICD-10-CM | POA: Diagnosis present

## 2015-06-29 LAB — BASIC METABOLIC PANEL
Anion gap: 8 (ref 5–15)
BUN: 20 mg/dL (ref 6–20)
CO2: 25 mmol/L (ref 22–32)
Calcium: 7.9 mg/dL — ABNORMAL LOW (ref 8.9–10.3)
Chloride: 112 mmol/L — ABNORMAL HIGH (ref 101–111)
Creatinine, Ser: 1.43 mg/dL — ABNORMAL HIGH (ref 0.44–1.00)
GFR calc Af Amer: 41 mL/min — ABNORMAL LOW (ref >60)
GFR calc non Af Amer: 36 mL/min — ABNORMAL LOW (ref >60)
Glucose, Bld: 219 mg/dL — ABNORMAL HIGH (ref 65–99)
Potassium: 4.2 mmol/L (ref 3.5–5.1)
Sodium: 145 mmol/L (ref 135–145)

## 2015-06-29 LAB — GLUCOSE, CAPILLARY
Glucose-Capillary: 185 mg/dL — ABNORMAL HIGH (ref 65–99)
Glucose-Capillary: 239 mg/dL — ABNORMAL HIGH (ref 65–99)
Glucose-Capillary: 111 mg/dL — ABNORMAL HIGH (ref 65–99)
Glucose-Capillary: 162 mg/dL — ABNORMAL HIGH (ref 65–99)

## 2015-06-29 LAB — CBC
HCT: 32.9 % — ABNORMAL LOW (ref 36.0–46.0)
Hemoglobin: 10.2 g/dL — ABNORMAL LOW (ref 12.0–15.0)
MCH: 30.8 pg (ref 26.0–34.0)
MCHC: 31 g/dL (ref 30.0–36.0)
MCV: 99.4 fL (ref 78.0–100.0)
Platelets: 327 10*3/uL (ref 150–400)
RBC: 3.31 MIL/uL — ABNORMAL LOW (ref 3.87–5.11)
RDW: 15.2 % (ref 11.5–15.5)
WBC: 11.9 10*3/uL — ABNORMAL HIGH (ref 4.0–10.5)

## 2015-06-29 LAB — CULTURE, BLOOD (SINGLE)

## 2015-06-29 NOTE — Clinical Social Work Note (Signed)
Clinical Social Work Assessment  Patient Details  Name: GRACYN ALLOR MRN: [id number] Date of Birth: 01/02/1941  Date of referral:  06/29/15               Reason for consult:  Facility Placement                Permission sought to share information with:  Facility Sport and exercise psychologist, Family Supports Permission granted to share information::  Yes, Verbal Permission Granted  Name::        Agency::     Relationship::     Contact Information:     Housing/Transportation Living arrangements for the past 2 months:  Single Family Home Source of Information:  Patient, Adult Children Patient Interpreter Needed:  None Criminal Activity/Legal Involvement Pertinent to Current Situation/Hospitalization:    Significant Relationships:  Adult Children Lives with:    Do you feel safe going back to the place where you live?    Need for family participation in patient care:  Yes (Comment)  Care giving concerns:  No caregiver   Social Worker assessment / plan:  CSW met with pt and her children at bedside to discuss possible rehab at discharge.  CSW prompted pt and family to discuss history and needs.  CSW explained SNF process and provided family with list of facilities.  CSW encouraged pt and family to explore thoughts and feelings related to pt health and rehab needs.  CSW provided supportive listening and answered any questions pt and family had regarding rehab.  CSW called PT to ask for recommendation as current PT evaluation had no recommendation.  CSW send pt information to SNF's in [place] area  Employment status:  Retired Forensic scientist:  Managed Care PT Recommendations:  Not assessed at this time Information / Referral to community resources:     Patient/Family's Response to care:  Pt discussed that she had denied wanting SNF at first but after speaking with her family she believes she needs rehab.  Pt discussed living alone and being very active before hospitialization.  Pt's family discussed pt's need to have help with going to the bathroom and living alone would not be safe.  Pt's family discussed pt's need to have help when walking and using a walker and that this is new for pt     Patient/Family's Understanding of and Emotional Response to Diagnosis, Current Treatment, and Prognosis:  Pt appears to understand her needs and family is hopeful that rehab is possible for pt.  Pt and family grateful for CSW help  Emotional Assessment Appearance:  Appears stated age Attitude/Demeanor/Rapport:  Apprehensive Affect (typically observed):  Accepting Orientation:  Oriented to Self, Oriented to Place, Oriented to  Time, Oriented to Situation Alcohol / Substance use:    Psych involvement (Current and /or in the community):     Discharge Needs  Concerns to be addressed:    Readmission within the last 30 days:    Current discharge risk:    Barriers to Discharge:  No Barriers Identified   Carlean Jews, LCSW 06/29/2015, 8:21 AM

## 2015-06-29 NOTE — Progress Notes (Signed)
Physical Therapy Treatment Patient Details Name: Carrie [name] MRN: [id number] DOB: [birth year] Today's Date: 06/29/2015    History of Present Illness Carrie [name] is an 74 y.o. female with a PMH of follicular lymphoma grade 3A, last chemo  cycle given 06/02/15, with subsequent follow-up PET scan showing near complete response to treatment. She was evaluated at the cancer center on 06/18/15 secondary to diarrhea, dizziness and mild nausea. She has not had much of an appetite since then. She was documented to have lost 5 pounds in a few days. She received IV fluids at the cancer center on 8/31, 9/1, 9/2 and 9/3 but despite this, she has continued to feel weak. She has had abdominal discomfort/dyspepsia per her family, but denies any specific symptoms. Upon initial evaluation in the ED 06/25/15,  the patient was noted to have findings concerning for a UTI. A 12 lead EKG showed atrial fibrillation with RVR (no known history of same).     PT Comments    Pt seen today for gait/balance activities, she is progressing but requires incr time and occasional re-direction to task, cues for sequencing and task initiation/completion; her family is quite concerned about her returning home alone due to her mental status; she is oriented x 3 but does  Demo slow processing and would benefit from [facility] for rehab prior to facilitate safe transition back to  I living; (pt was very active at her baseline per family). Will continue to follow while on acute   Follow Up Recommendations  Supervision for mobility/OOB;SNF (vs HHPT depending on progress/home support)     Equipment Recommendations  Rolling walker with 5" wheels (youth ht)    Recommendations for Other Services       Precautions / Restrictions Precautions Precautions: Fall    Mobility  Bed Mobility Overal bed mobility: Needs Assistance Bed Mobility: Supine to Sit;Sit to Supine     Supine to sit: Supervision Sit to supine: Supervision    General bed mobility comments: incr time, supervision for safety  Transfers Overall transfer level: Needs assistance Equipment used: Rolling walker (2 wheeled) Transfers: Sit to/from Stand Sit to Stand: Supervision         General transfer comment: cues for safety  Ambulation/Gait Ambulation/Gait assistance: Supervision;Min guard Ambulation Distance (Feet): 150 Feet Assistive device: Rolling walker (2 wheeled) Gait Pattern/deviations: Step-through pattern     General Gait Details: cues for RW position and safety, occasional assist/guiding for RW direction to maneuver in smaller spaces   Stairs            Wheelchair Mobility    Modified Rankin (Stroke Patients Only)       Balance   Sitting-balance support: Feet supported;No upper extremity supported Sitting balance-Leahy Scale: Normal (pt dons/doffs shoes, dons undergarment in sitting)     Standing balance support: Bilateral upper extremity supported;Single extremity supported;No upper extremity supported;During functional activity Standing balance-Leahy Scale: Fair               High level balance activites: Backward walking;Direction changes;Turns;Head turns;Sudden stops;Side stepping High Level Balance Comments: pt requires incr time, moves slowly at times, but without LOB, safety cues for RW during balance tasks    Cognition Arousal/Alertness: Awake/alert Behavior During Therapy: [facility] for tasks assessed/performed;Impulsive Overall Cognitive Status: Impaired/Different from baseline Area of Impairment: Following commands;Safety/judgement;Awareness;Problem solving       Following Commands: Follows one step commands with increased time Safety/Judgement: Decreased awareness of safety   Problem Solving: Decreased initiation;Difficulty sequencing;Requires verbal cues;Slow processing General  Comments: requires redirection to task, pt family reports her cognition is signnificantly different compared to  her baselin    Exercises      General Comments        Pertinent Vitals/Pain Pain Assessment: No/denies pain    Home Living                      Prior Function            PT Goals (current goals can now be found in the care plan section) Acute Rehab PT Goals Patient Stated Goal: to return home PT Goal Formulation: With patient/family Time For Goal Achievement: 07/11/15 Potential to Achieve Goals: Good Progress towards PT goals: Progressing toward goals    Frequency  Min 3X/week    PT Plan Current plan remains appropriate    Co-evaluation             End of Session Equipment Utilized During Treatment: Gait belt Activity Tolerance: Patient tolerated treatment well Patient left: in bed;with call bell/phone within reach;with family/visitor present     Time: 7322-0254 PT Time Calculation (min) (ACUTE ONLY): 35 min  Charges:  $Gait Training: 23-37 mins                    G Codes:      Dewight Catino July 03, 2015, 1:37 PM

## 2015-06-29 NOTE — Progress Notes (Signed)
Progress Note   Carrie [name] BTD:[id number] DOB: 1940/12/12 DOA: 06/25/2015 PCP: Darlyn Chamber, MD   Brief Narrative:   Carrie [name] is an 74 y.o. female with a PMH of follicular lymphoma grade 3A, under the care of Dr. Alvy Bimler, originally diagnosed 01/21/15 with initiation of R-CHOP chemotherapy 02/17/15 s/p 6 cycles with last cycle given 06/02/15, with subsequent follow-up PET scan showing near complete response to treatment, who was admitted 06/25/15 with chief complaint of weight loss, loss of appetite, nausea, and weakness. Upon initial evaluation in the ED, the patient was noted to have findings concerning for a UTI. A 12 lead EKG showed atrial fibrillation with RVR (no known history of same). The patient was placed on a Cardizem drip and cardiology consultation was requested. Blood cultures drawn in the ED are growing gram-negative rods.  Assessment/Plan:   Principal Problems:   Sepsis with gram-negative rod bacteremia/ E. Coli UTI - Blood and urine cultures positive for Escherichia coli, antibiotics narrowed from Zosyn to Ancef 06/27/15.   Acute kidney injury secondary to dehydration/hypernatremia - Continue hypotonic IV fluids. Creatinine continues to improve. Sodium normalized 06/29/15.    Atrial fibrillation with RVR with demand ischemia - Cardizem drip ordered for rate control on admission, now on oral Cardizem 60 mg Q 8 hours. - Troponin slightly elevated consistent with demand ischemia. - TSH 0.851, WNL. - CHADS2-VASC score is 2 (high risk) with a 2.2% yearly risk of stroke, oral anticoagulation typically recommended. - Cardiology consultation performed 06/25/15. Agree with holding off on anticoagulation as risk outweighs benefit. - Continue aspirin. Rate improved & converted back to NSR overnight. - Limited 2 D Echo shows EF 55% with no regional wall motion abnormalities.  Active Problems:  Hypokalemia - Resolved with supplementation.   Oral and esophageal  thrush/dysphasia - Continue Diflucan and nystatin oral suspension. Magic mouthwash added 06/28/15 for symptom control as needed. - Speech therapy consult/swallowing evaluation requested secondary to ongoing dysphasia.   Hypothyroidism - Continue home dose of Synthroid. TSH WNL.   Follicular lymphoma grade 3a - Dr. Alvy Bimler following.   Other fatigue - PT evaluation requested.   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   Anemia due to antineoplastic chemotherapy - 2 g drop in hemoglobin consistent with hemodilution after hydration. Hemoglobin stable after initial drop.   Leukocytosis - Secondary to sepsis, continue antibiotics.   Nausea without vomiting - Antinausea medications ordered. - Continue PPI. - Advance diet to carb modified.   Hyperglycemia/new diagnosis diabetes - Hemoglobin A1c 6.8%, consistent with diabetes. - Currently being managed with SSI, Insulin sensitive scale with 3 units for meal coverage. CBGs 145-230. - Seen and counseled by Diabetes coordinator and dietitian.   DVT prophylaxis - Lovenox ordered.  Code Status: Full. Family Communication: Carrie [name] (daughter) updated at bedside 06/27/15. Disposition Plan: Home when stable, likely 06/30/15.  IV Access:    Porta Cath   Procedures and diagnostic studies:   No results found.   Medical Consultants:    Oncology: [name], MD  Cardiology: Sanda Klein, MD  Anti-Infectives:    Zosyn 06/25/15--->  Rocephin 06/25/15---> 06/26/15  Subjective:   Carrie [name] reports dysphagia and trouble swallowing. Says it feels like she has "a lump in my throat". No shortness of breath or cough. No fevers. Appetite still poor. Bowels moved yesterday.  Objective:    Filed Vitals:   06/27/15 2129 06/28/15 0615 06/28/15 1511 06/28/15 2101  BP: 139/82 158/78 149/83 156/83  Pulse:  85 80 80 81  Temp: 98.4 F (36.9 C) 97.4 F (36.3 C) 97.8 F (36.6 C) 98.4 F (36.9 C)   TempSrc: Oral Oral Oral Oral  Resp: 18 18 18 18   Height:      Weight:      SpO2: 96% 95% 96% 97%    Intake/Output Summary (Last 24 hours) at 06/29/15 0939 Last data filed at 06/28/15 1924  Gross per 24 hour  Intake   1560 ml  Output    125 ml  Net   1435 ml   Filed Weights   06/25/15 2000 06/26/15 0500  Weight: 63.9 kg (140 lb 14 oz) 62.6 kg (138 lb 0.1 oz)    Exam: Gen:  NAD Cardiovascular:  RRR, II/VI SEM Respiratory:  Lungs diminished Gastrointestinal:  Abdomen soft, NT/ND, + BS Extremities:  No C/E/C   Data Reviewed:    Labs: Basic Metabolic Panel:  Recent Labs Lab 06/25/15 1200 06/25/15 1633 06/26/15 0615 06/27/15 0500 06/28/15 0544 06/29/15 0500  NA 150*  --  155* 154* 149* 145  K 3.2*  --  3.8 3.4* 4.2 4.2  CL 113*  --  121* 121* 117* 112*  CO2 24  --  25 26 25 25   GLUCOSE 187*  --  175* 186* 230* 219*  BUN 54*  --  46* 35* 25* 20  CREATININE 2.51* 2.44* 2.29* 1.86* 1.67* 1.43*  CALCIUM 8.5*  --  8.0* 7.4* 7.6* 7.9*   GFR Estimated Creatinine Clearance: 30.5 mL/min (by C-G formula based on Cr of 1.43). Liver Function Tests:  Recent Labs Lab 06/25/15 1215  AST 157*  ALT 106*  ALKPHOS 106  BILITOT 0.7  PROT 6.2*  ALBUMIN 2.8*   CBC:  Recent Labs Lab 06/25/15 1633 06/26/15 0615 06/27/15 0500 06/28/15 0544 06/29/15 0500  WBC 21.6* 18.2* 16.1* 14.8* 11.9*  HGB 11.0* 9.6* 9.4* 10.3* 10.2*  HCT 34.4* 30.9* 29.6* 32.9* 32.9*  MCV 97.5 98.7 99.7 99.1 99.4  PLT 317 322 278 320 327   Cardiac Enzymes:  Recent Labs Lab 06/25/15 1633 06/25/15 2127 06/26/15 0615  TROPONINI 0.96* 0.69* 0.56*   CBG:  Recent Labs Lab 06/28/15 0736 06/28/15 1134 06/28/15 1641 06/28/15 2059 06/29/15 0727  GLUCAP 230* 225* 159* 145* 185*   Thyroid function studies: No results for input(s): TSH, T4TOTAL, T3FREE, THYROIDAB in the last 72 hours.  Invalid input(s): FREET3 Sepsis Labs:  Recent Labs Lab 06/25/15 1240  06/26/15 0615  06/27/15 0500 06/28/15 0544 06/29/15 0500  WBC  --   < > 18.2* 16.1* 14.8* 11.9*  LATICACIDVEN 1.39  --   --   --   --   --   < > = values in this interval not displayed. Microbiology Recent Results (from the past 240 hour(s))  Culture, blood (single)     Status: None   Collection Time: 06/25/15 12:00 PM  Result Value Ref Range Status   Specimen Description BLOOD CENTRAL LINE  Final   Special Requests BOTTLES DRAWN AEROBIC AND ANAEROBIC 5CC  Final   Culture  Setup Time   Final    GRAM NEGATIVE RODS IN BOTH AEROBIC AND ANAEROBIC BOTTLES CRITICAL RESULT CALLED TO, READ BACK BY AND VERIFIED WITH: S EARLY@0518  06/26/15 MKELLY    Culture   Final    ESCHERICHIA COLI Performed at [facility]    Report Status 06/28/2015 FINAL  Final  Culture, Urine     Status: None   Collection Time: 06/25/15  3:35 PM  Result  Value Ref Range Status   Specimen Description URINE, CLEAN CATCH  Final   Special Requests NONE  Final   Culture   Final    >=100,000 COLONIES/mL ESCHERICHIA COLI Performed at [facility]    Report Status 06/27/2015 FINAL  Final   Organism ID, Bacteria ESCHERICHIA COLI  Final      Susceptibility   Escherichia coli - MIC*    AMPICILLIN >=32 RESISTANT Resistant     CEFAZOLIN <=4 SENSITIVE Sensitive     CEFTRIAXONE <=1 SENSITIVE Sensitive     CIPROFLOXACIN <=0.25 SENSITIVE Sensitive     GENTAMICIN <=1 SENSITIVE Sensitive     IMIPENEM <=0.25 SENSITIVE Sensitive     NITROFURANTOIN <=16 SENSITIVE Sensitive     TRIMETH/SULFA <=20 SENSITIVE Sensitive     AMPICILLIN/SULBACTAM >=32 RESISTANT Resistant     PIP/TAZO <=4 SENSITIVE Sensitive     * >=100,000 COLONIES/mL ESCHERICHIA COLI  MRSA PCR Screening     Status: None   Collection Time: 06/25/15  6:54 PM  Result Value Ref Range Status   MRSA by PCR NEGATIVE NEGATIVE Final    Comment:        The GeneXpert MRSA Assay (FDA approved for NASAL specimens only), is one component of a comprehensive MRSA  colonization surveillance program. It is not intended to diagnose MRSA infection nor to guide or monitor treatment for MRSA infections.      Medications:   . aspirin  81 mg Oral Daily  . calcium carbonate  2 tablet Oral Daily  .  ceFAZolin (ANCEF) IV  1 g Intravenous Q12H  . diltiazem  180 mg Oral Daily  . enoxaparin (LOVENOX) injection  30 mg Subcutaneous Q24H  . feeding supplement (ENSURE ENLIVE)  237 mL Oral BID BM  . fluconazole (DIFLUCAN) IV  200 mg Intravenous Q24H  . insulin aspart  0-5 Units Subcutaneous QHS  . insulin aspart  0-9 Units Subcutaneous TID WC  . insulin aspart  3 Units Subcutaneous TID WC  . levothyroxine  100 mcg Oral QAC breakfast  . loratadine  10 mg Oral Daily  . multivitamin with minerals  1 tablet Oral Daily  . nystatin  5 mL Oral QID  . pantoprazole  40 mg Oral Q0600   Continuous Infusions: . dextrose 5 % with kcl 100 mL/hr at 06/29/15 0054    Time spent: 25 minutes.    LOS: 4 days   [facility] Pager [phone]. If unable to reach me by pager, please call my cell phone at [phone].  *Please refer to [email or web address], password [email or web address] to get updated schedule on who will round on this patient, as hospitalists switch teams weekly. If 7PM-7AM, please contact night-coverage at [email or web address], password [email or web address] for any overnight needs.  06/29/2015, 9:39 AM

## 2015-06-30 DIAGNOSIS — E46 Unspecified protein-calorie malnutrition: Secondary | ICD-10-CM

## 2015-06-30 DIAGNOSIS — R1310 Dysphagia, unspecified: Secondary | ICD-10-CM

## 2015-06-30 LAB — GLUCOSE, CAPILLARY
Glucose-Capillary: 190 mg/dL — ABNORMAL HIGH (ref 65–99)
Glucose-Capillary: 156 mg/dL — ABNORMAL HIGH (ref 65–99)
Glucose-Capillary: 132 mg/dL — ABNORMAL HIGH (ref 65–99)
Glucose-Capillary: 134 mg/dL — ABNORMAL HIGH (ref 65–99)

## 2015-06-30 MED ORDER — SODIUM CHLORIDE 0.9 % IV SOLN
INTRAVENOUS | Status: DC
  Administered 2015-06-30 – 2015-07-02 (×2): via INTRAVENOUS

## 2015-06-30 NOTE — Progress Notes (Signed)
SLP Note  Patient Details Name: CONSUELLO LASSALLE MRN: [id number] DOB: 1941-08-24   Reason Eval/Treat Not Completed: Other (comment) (SLP to see pt next date for evaluation, thanks)   Luanna Salk, [place] [facility] SLP [phone]

## 2015-06-30 NOTE — Progress Notes (Signed)
Carrie [name]   DOB:August 03, 1941   FT#:[id number]    I have seen the patient, examined her and edited the notes as follows  Subjective: Patient seen and examined. She is now out of the SDU. She reports feeling better. She denies any fever, chills or night sweats. She denies any shortness of breath. Denies any chest pain or palpitations. Denies dizziness. Denies lower extremity swelling.  Reports some trouble after swallowing, reports food "getting stuck", for which she reports she is to have a swallow study today. Denies nausea or vomiting. Denies constipation, last bowel movement on 9/10. Appetite improving. Denies any dysuria. Denies abnormal skin rashes, or neuropathy. She does bruise easily. Denies any bleeding issues such as epistaxis, hematemesis, hematuria or hematochezia. Ambulating with assistance.  Objective:  Filed Vitals:   06/30/15 0512  BP: 157/92  Pulse: 78  Temp: 97.6 F (36.4 C)  Resp: 18     Intake/Output Summary (Last 24 hours) at 06/30/15 7062 Last data filed at 06/29/15 1900  Gross per 24 hour  Intake   1560 ml  Output      0 ml  Net   1560 ml    GENERAL:alert, no distress and comfortable SKIN: skin color, texture, turgor are normal, no rashes or significant lesions EYES: normal, Conjunctiva are pink and non-injected, sclera clear OROPHARYNX:no exudate, no erythema and lips, buccal mucosa, and tongue normal  NECK: supple, thyroid normal size, non-tender, without nodularity LYMPH:  no palpable lymphadenopathy in the cervical, axillary or inguinal LUNGS: clear to auscultation and percussion with normal breathing effort HEART: regular rate & rhythm and no murmurs and trace lower extremity edema ABDOMEN:abdomen soft, non-tender and normal bowel sounds Musculoskeletal:no cyanosis of digits and no clubbing  NEURO: alert & oriented x 3 with fluent speech, no focal motor/sensory deficits   Labs:  Lab Results  Component Value Date   WBC 11.9* 06/29/2015   HGB 10.2*  06/29/2015   HCT 32.9* 06/29/2015   MCV 99.4 06/29/2015   PLT 327 06/29/2015   NEUTROABS 14.5* 06/18/2015    Lab Results  Component Value Date   NA 145 06/29/2015   K 4.2 06/29/2015   CL 112* 06/29/2015   CO2 25 06/29/2015   Assessment & Plan:   Follicular lymphoma grade 3a She has no signs of disease recurrence. Unfortunately, she developed significant complication from recent treatment with profound dehydration and diarrhea requiring aggressive IV fluid resuscitation. She will continue supportive care  Dehydration, improved She has significant dehydration from recent diarrhea -now resolved-, complicated with acute renal failure during this admission Continue hydration as recommended by primary team.  Acute Kidney Injury, resolved Due to dehydration and hypernatremia She is receiving IV fluids and electrolyte replenishment with good response.  Atrial Fibrillation with Rapid Ventricular Response, resolving This is a new onset She was placed on IV Cardizem, Metoprolol, now better rate controlled. 2 D echo on 9/8 without regional wall motion abnormalities, EF 55%. Appreciate Cardiology involvement  Thrush, resolved This was treated with IV Diflucan and Nystatin.  Dysphasia She is to undergo swallow evaluation today  Deconditioning, improving Appreciate PT/OT involvement  Leukocytosis with bacteremia and urinary tract infection, improving This is related to recent G-CSF injection, and UTI infection, dehydration. Cultures are positive for E Coli in urine She is afebrile IV Zosyn initiated on 9/8 Leukocytosis is trending back to normal Will continue to monitor.  Anemia in neoplastic disease In the setting of dilution and infection, antibiotics No bleeding issues are present at this time  Continue to monitor.   Malnutrition Consider Nutrition evaluation  Constipation, resolved This was due to decreased oral intake, decreased mobility, dehydration,  hypothyroidism, hypernatremia Senokot changed to prn on 9/10  DVT prophylaxis On Lovenox  Full Code Other medical issues including hyperglycemia, as per admitting team Will follow, hopefully DC within next few days I will sign off. She appointment to see me at the end of the month   [facility] E, PA-C 06/30/2015  6:42 AM Devarius Nelles, MD 06/30/2015

## 2015-06-30 NOTE — Progress Notes (Signed)
CSW went and spoke with patient regarding plans for discharge. Patient had two neighbors and one church member at the bedside. Patient informed CSW that it was appropriate for visitors to stay in the room. Patient expressed to CSW her understanding about SNF placement. CSW informed patient that prior authorization is needed for insurance. At this time, patient does not have any skilled needs for placement. CSW agreed to assist with SNF placement with the understanding that she would need to pay privately. Patient is familiar with home health and informed CSW that she would prefer this option over SNF. Patient stated that she has never had [facility] for herself, but she has used services through [facility] in the past for her husband. CSW discussed with patient the CSW's role, and informed her that CM would come to speak with her regarding Home Health. Patient agreeable to plan. CSW signing off. Available if needed.   Lucius Conn, [place] Social Worker [place] [phone]

## 2015-06-30 NOTE — Care Management Important Message (Signed)
Important Message  Patient Details  Name: AVANGELINE STOCKBURGER MRN: [id number] Date of Birth: 11/09/1940   Medicare Important Message Given:  [facility] notification given    Camillo Flaming 06/30/2015, 12:33 [place] Message  Patient Details  Name: KEALANI LECKEY MRN: [id number] Date of Birth: 06/18/1941   Medicare Important Message Given:  Yes-second notification given    Camillo Flaming 06/30/2015, 12:33 PM

## 2015-06-30 NOTE — Progress Notes (Signed)
Progress Note   Carrie [name] FYB:[id number] DOB: 26-Jan-1941 DOA: 06/25/2015 PCP: Darlyn Chamber, MD   Brief Narrative:   SENNIE BORDEN is an 74 y.o. female with a PMH of follicular lymphoma grade 3A, under the care of Dr. Alvy Bimler, originally diagnosed 01/21/15 with initiation of R-CHOP chemotherapy 02/17/15 s/p 6 cycles with last cycle given 06/02/15, with subsequent follow-up PET scan showing near complete response to treatment, who was admitted 06/25/15 with chief complaint of weight loss, loss of appetite, nausea, and weakness. Upon initial evaluation in the ED, the patient was noted to have findings concerning for a UTI. A 12 lead EKG showed atrial fibrillation with RVR (no known history of same). The patient was placed on a Cardizem drip and cardiology consultation was requested. Blood cultures drawn in the ED are growing gram-negative rods.  Assessment/Plan:   Principal Problems:   Sepsis with gram-negative rod bacteremia/ E. Coli UTI - Blood and urine cultures positive for Escherichia coli, antibiotics narrowed from Zosyn to Ancef 06/27/15.   Acute kidney injury secondary to dehydration/hypernatremia - Creatinine continues to improve. Sodium normalized 06/29/15. - KVO IVF and monitor kidney function to ensure she can maintain hydration status.    Atrial fibrillation with RVR with demand ischemia - Cardizem drip ordered for rate control on admission, now on oral Cardizem 180 mg daily. - Troponin slightly elevated consistent with demand ischemia. TSH 0.851, WNL. - CHADS2-VASC score is 2 (high risk) with a 2.2% yearly risk of stroke, oral anticoagulation typically recommended. - Cardiology consultation performed 06/25/15. Agree with holding off on anticoagulation as risk outweighs benefit. - Continue aspirin.  - Limited 2 D Echo shows EF 55% with no regional wall motion abnormalities. - Maintaining NSR.  D/C tele.  Active Problems:  Hypokalemia - Resolved with supplementation.    Oral and esophageal thrush/dysphasia - Continue Diflucan and nystatin oral suspension. Magic mouthwash added 06/28/15 for symptom control as needed. - Speech therapy consult/swallowing evaluation requested secondary to ongoing dysphasia.   Hypothyroidism - Continue home dose of Synthroid. TSH WNL.   Follicular lymphoma grade 3a - Dr. Alvy Bimler signed off and will follow up with her as an outpatient.   Other fatigue - PT evaluation performed 06/29/15, SNF versus home with supervision/HHPT.   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   Anemia due to antineoplastic chemotherapy - 2 g drop in hemoglobin consistent with hemodilution after hydration. Hemoglobin stable after initial drop.   Leukocytosis - Secondary to sepsis, continue antibiotics.  Trending down.   Nausea without vomiting - Antinausea medications ordered. - Continue PPI. - Appetite poor but nausea/vomiting seems to have resolved.   Hyperglycemia/new diagnosis diabetes - Hemoglobin A1c 6.8%, consistent with diabetes. - Currently being managed with SSI, Insulin sensitive scale with 3 units for meal coverage. CBGs 145-230. - Seen and counseled by Diabetes coordinator and dietitian. - D/C IVF containing dextrose.   DVT prophylaxis - Lovenox ordered.  Code Status: Full. Family Communication: Colvin Caroli (daughter) updated by telephone. Disposition Plan: Home when stable, likely 06/30/15.  IV Access:    Porta Cath   Procedures and diagnostic studies:   No results found.   Medical Consultants:    Oncology: [name], MD  Cardiology: Sanda Klein, MD  Anti-Infectives:    Zosyn 06/25/15--->06/29/15  Rocephin 06/25/15---> 06/26/15  Ancef 06/29/15--->  Subjective:   Carrie [name] reports dysphagia and trouble swallowing. Says it feels like she has "a lump in my throat". No shortness of breath  or cough. No fevers. Appetite still poor. Bowels moved yesterday.  Objective:     Filed Vitals:   06/28/15 2101 06/29/15 1500 06/29/15 2110 06/30/15 0512  BP: 156/83 138/67 153/82 157/92  Pulse: 81 84 84 78  Temp: 98.4 F (36.9 C) 98.5 F (36.9 C) 98.3 F (36.8 C) 97.6 F (36.4 C)  TempSrc: Oral Oral Oral Oral  Resp: 18 18 18 18   Height:      Weight:      SpO2: 97% 100% 98% 97%    Intake/Output Summary (Last 24 hours) at 06/30/15 0757 Last data filed at 06/29/15 1900  Gross per 24 hour  Intake   1560 ml  Output      0 ml  Net   1560 ml   Filed Weights   06/25/15 2000 06/26/15 0500  Weight: 63.9 kg (140 lb 14 oz) 62.6 kg (138 lb 0.1 oz)    Exam: Gen:  NAD Cardiovascular:  RRR, II/VI SEM Respiratory:  Lungs diminished Gastrointestinal:  Abdomen soft, NT/ND, + BS Extremities:  No C/E/C   Data Reviewed:    Labs: Basic Metabolic Panel:  Recent Labs Lab 06/25/15 1200 06/25/15 1633 06/26/15 0615 06/27/15 0500 06/28/15 0544 06/29/15 0500  NA 150*  --  155* 154* 149* 145  K 3.2*  --  3.8 3.4* 4.2 4.2  CL 113*  --  121* 121* 117* 112*  CO2 24  --  25 26 25 25   GLUCOSE 187*  --  175* 186* 230* 219*  BUN 54*  --  46* 35* 25* 20  CREATININE 2.51* 2.44* 2.29* 1.86* 1.67* 1.43*  CALCIUM 8.5*  --  8.0* 7.4* 7.6* 7.9*   GFR Estimated Creatinine Clearance: 30.5 mL/min (by C-G formula based on Cr of 1.43). Liver Function Tests:  Recent Labs Lab 06/25/15 1215  AST 157*  ALT 106*  ALKPHOS 106  BILITOT 0.7  PROT 6.2*  ALBUMIN 2.8*   CBC:  Recent Labs Lab 06/25/15 1633 06/26/15 0615 06/27/15 0500 06/28/15 0544 06/29/15 0500  WBC 21.6* 18.2* 16.1* 14.8* 11.9*  HGB 11.0* 9.6* 9.4* 10.3* 10.2*  HCT 34.4* 30.9* 29.6* 32.9* 32.9*  MCV 97.5 98.7 99.7 99.1 99.4  PLT 317 322 278 320 327   Cardiac Enzymes:  Recent Labs Lab 06/25/15 1633 06/25/15 2127 06/26/15 0615  TROPONINI 0.96* 0.69* 0.56*   CBG:  Recent Labs Lab 06/29/15 0727 06/29/15 1141 06/29/15 1643 06/29/15 2157 06/30/15 0719  GLUCAP 185* 239* 111* 162* 190*    Sepsis Labs:  Recent Labs Lab 06/25/15 1240  06/26/15 0615 06/27/15 0500 06/28/15 0544 06/29/15 0500  WBC  --   < > 18.2* 16.1* 14.8* 11.9*  LATICACIDVEN 1.39  --   --   --   --   --   < > = values in this interval not displayed. Microbiology Recent Results (from the past 240 hour(s))  Culture, blood (single)     Status: None   Collection Time: 06/25/15 12:00 PM  Result Value Ref Range Status   Specimen Description BLOOD CENTRAL LINE  Final   Special Requests BOTTLES DRAWN AEROBIC AND ANAEROBIC 5CC  Final   Culture  Setup Time   Final    GRAM NEGATIVE RODS IN BOTH AEROBIC AND ANAEROBIC BOTTLES CRITICAL RESULT CALLED TO, READ BACK BY AND VERIFIED WITH: S EARLY@[id number]  06/26/15 MKELLY    Culture   Final    ESCHERICHIA COLI Performed at [facility]    Report Status 06/29/2015 FINAL  Final  Organism ID, Bacteria ESCHERICHIA COLI  Final      Susceptibility   Escherichia coli - MIC*    AMPICILLIN >=32 RESISTANT Resistant     CEFAZOLIN <=4 SENSITIVE Sensitive     CEFEPIME <=1 SENSITIVE Sensitive     CEFTAZIDIME <=1 SENSITIVE Sensitive     CEFTRIAXONE <=1 SENSITIVE Sensitive     CIPROFLOXACIN <=0.25 SENSITIVE Sensitive     GENTAMICIN <=1 SENSITIVE Sensitive     IMIPENEM <=0.25 SENSITIVE Sensitive     TRIMETH/SULFA <=20 SENSITIVE Sensitive     AMPICILLIN/SULBACTAM >=32 RESISTANT Resistant     PIP/TAZO <=4 SENSITIVE Sensitive     * ESCHERICHIA COLI  Culture, Urine     Status: None   Collection Time: 06/25/15  3:35 PM  Result Value Ref Range Status   Specimen Description URINE, CLEAN CATCH  Final   Special Requests NONE  Final   Culture   Final    >=100,000 COLONIES/mL ESCHERICHIA COLI Performed at [facility]    Report Status 06/27/2015 FINAL  Final   Organism ID, Bacteria ESCHERICHIA COLI  Final      Susceptibility   Escherichia coli - MIC*    AMPICILLIN >=32 RESISTANT Resistant     CEFAZOLIN <=4 SENSITIVE Sensitive     CEFTRIAXONE <=1 SENSITIVE  Sensitive     CIPROFLOXACIN <=0.25 SENSITIVE Sensitive     GENTAMICIN <=1 SENSITIVE Sensitive     IMIPENEM <=0.25 SENSITIVE Sensitive     NITROFURANTOIN <=16 SENSITIVE Sensitive     TRIMETH/SULFA <=20 SENSITIVE Sensitive     AMPICILLIN/SULBACTAM >=32 RESISTANT Resistant     PIP/TAZO <=4 SENSITIVE Sensitive     * >=100,000 COLONIES/mL ESCHERICHIA COLI  MRSA PCR Screening     Status: None   Collection Time: 06/25/15  6:54 PM  Result Value Ref Range Status   MRSA by PCR NEGATIVE NEGATIVE Final    Comment:        The GeneXpert MRSA Assay (FDA approved for NASAL specimens only), is one component of a comprehensive MRSA colonization surveillance program. It is not intended to diagnose MRSA infection nor to guide or monitor treatment for MRSA infections.      Medications:   . aspirin  81 mg Oral Daily  . calcium carbonate  2 tablet Oral Daily  .  ceFAZolin (ANCEF) IV  1 g Intravenous Q12H  . diltiazem  180 mg Oral Daily  . enoxaparin (LOVENOX) injection  30 mg Subcutaneous Q24H  . feeding supplement (ENSURE ENLIVE)  237 mL Oral BID BM  . fluconazole (DIFLUCAN) IV  200 mg Intravenous Q24H  . insulin aspart  0-5 Units Subcutaneous QHS  . insulin aspart  0-9 Units Subcutaneous TID WC  . insulin aspart  3 Units Subcutaneous TID WC  . levothyroxine  100 mcg Oral QAC breakfast  . loratadine  10 mg Oral Daily  . multivitamin with minerals  1 tablet Oral Daily  . nystatin  5 mL Oral QID  . pantoprazole  40 mg Oral Q0600   Continuous Infusions: . dextrose 5 % with kcl 100 mL/hr at 06/29/15 2324    Time spent: 25 minutes.    LOS: 5 days   [facility] Pager [phone]. If unable to reach me by pager, please call my cell phone at [phone].  *Please refer to [email or web address], password [email or web address] to get updated schedule on who will round on this patient, as hospitalists switch teams weekly. If 7PM-7AM, please contact night-coverage at [email or web address], password [facility] for any  overnight needs.  06/30/2015, 7:57 AM

## 2015-06-30 NOTE — Progress Notes (Signed)
[id number]/DIXBOER would like to use [facility] for [facility] when dcd.  [facility] notified./Rhonda Davis,RN,BSN,CCM

## 2015-07-01 ENCOUNTER — Inpatient Hospital Stay (HOSPITAL_COMMUNITY): Payer: Medicare HMO

## 2015-07-01 ENCOUNTER — Encounter (HOSPITAL_COMMUNITY): Payer: Self-pay | Admitting: Internal Medicine

## 2015-07-01 DIAGNOSIS — N183 Chronic kidney disease, stage 3 unspecified: Secondary | ICD-10-CM

## 2015-07-01 DIAGNOSIS — M79606 Pain in leg, unspecified: Secondary | ICD-10-CM

## 2015-07-01 HISTORY — DX: Chronic kidney disease, stage 3 unspecified: N18.30

## 2015-07-01 LAB — GLUCOSE, CAPILLARY
Glucose-Capillary: 234 mg/dL — ABNORMAL HIGH (ref 65–99)
Glucose-Capillary: 90 mg/dL (ref 65–99)
Glucose-Capillary: 153 mg/dL — ABNORMAL HIGH (ref 65–99)
Glucose-Capillary: 121 mg/dL — ABNORMAL HIGH (ref 65–99)

## 2015-07-01 LAB — BASIC METABOLIC PANEL
Anion gap: 9 (ref 5–15)
BUN: 13 mg/dL (ref 6–20)
CO2: 25 mmol/L (ref 22–32)
Calcium: 8.2 mg/dL — ABNORMAL LOW (ref 8.9–10.3)
Chloride: 109 mmol/L (ref 101–111)
Creatinine, Ser: 1.22 mg/dL — ABNORMAL HIGH (ref 0.44–1.00)
GFR calc Af Amer: 50 mL/min — ABNORMAL LOW (ref >60)
GFR calc non Af Amer: 43 mL/min — ABNORMAL LOW (ref >60)
Glucose, Bld: 215 mg/dL — ABNORMAL HIGH (ref 65–99)
Potassium: 3.4 mmol/L — ABNORMAL LOW (ref 3.5–5.1)
Sodium: 143 mmol/L (ref 135–145)

## 2015-07-01 LAB — CBC
HCT: 29.7 % — ABNORMAL LOW (ref 36.0–46.0)
Hemoglobin: 9.5 g/dL — ABNORMAL LOW (ref 12.0–15.0)
MCH: 31.4 pg (ref 26.0–34.0)
MCHC: 32 g/dL (ref 30.0–36.0)
MCV: 98 fL (ref 78.0–100.0)
Platelets: 314 10*3/uL (ref 150–400)
RBC: 3.03 MIL/uL — ABNORMAL LOW (ref 3.87–5.11)
RDW: 15.3 % (ref 11.5–15.5)
WBC: 10.8 10*3/uL — ABNORMAL HIGH (ref 4.0–10.5)

## 2015-07-01 MED ORDER — CEFAZOLIN SODIUM-DEXTROSE 2-3 GM-% IV SOLR
2.0000 g | Freq: Three times a day (TID) | INTRAVENOUS | Status: DC
  Administered 2015-07-01 – 2015-07-06 (×16): 2 g via INTRAVENOUS
  Filled 2015-07-01 (×18): qty 50

## 2015-07-01 MED ORDER — POTASSIUM CHLORIDE 10 MEQ/50ML IV SOLN
10.0000 meq | INTRAVENOUS | Status: AC
  Administered 2015-07-01 (×4): 10 meq via INTRAVENOUS
  Filled 2015-07-01 (×4): qty 50

## 2015-07-01 MED ORDER — ENOXAPARIN SODIUM 40 MG/0.4ML ~~LOC~~ SOLN
40.0000 mg | SUBCUTANEOUS | Status: DC
  Administered 2015-07-01 – 2015-07-06 (×6): 40 mg via SUBCUTANEOUS
  Filled 2015-07-01 (×6): qty 0.4

## 2015-07-01 MED ORDER — FLUCONAZOLE 100 MG PO TABS
100.0000 mg | ORAL_TABLET | Freq: Every day | ORAL | Status: DC
  Administered 2015-07-01 – 2015-07-06 (×6): 100 mg via ORAL
  Filled 2015-07-01 (×6): qty 1

## 2015-07-01 NOTE — Progress Notes (Signed)
PT Cancellation Note  Patient Details Name: Carrie [name] MRN: [id number] DOB: Oct 14, 1941   Cancelled Treatment:    Reason Eval/Treat Not Completed: Pain limiting ability to participate (pt reported her knee is hurting and x-ray of knee is pending.  Will follow. )   Philomena Doheny 07/01/2015, 1:54 PM [phone]

## 2015-07-01 NOTE — Progress Notes (Signed)
*  PRELIMINARY RESULTS* Vascular Ultrasound Left lower extremity venous duplex has been completed.  Preliminary findings: negative for DVT. Baker's cyst noted.   Landry Mellow, RDMS, RVT  07/01/2015, 2:22 PM

## 2015-07-01 NOTE — Evaluation (Signed)
Clinical/Bedside Swallow Evaluation Patient Details  Name: Carrie [name] MRN: [id number] Date of Birth: November 30, 1940  Today's Date: 07/01/2015 Time: SLP Start Time (ACUTE ONLY): 0845 SLP Stop Time (ACUTE ONLY): 0920 SLP Time Calculation (min) (ACUTE ONLY): 35 min  Past Medical History:  Past Medical History  Diagnosis Date  . DJD (degenerative joint disease)   . Hypothyroid   . Cervical spondylosis without myelopathy 10/25/2013  . Dental bridge present   . Follicular lymphoma grade 3a 02/03/2015  . Pneumonia   . Anemia     "years ago"  . Nausea without vomiting 06/20/2015  . Elevated troponin 02/18/2015  . Thrush of mouth and esophagus 06/25/2015  . Atrial fibrillation with rapid ventricular response 06/25/2015   Past Surgical History:  Past Surgical History  Procedure Laterality Date  . Patelar tendon transplants      Left/right  . Abdominal hysterectomy    . Dilation and curettage of uterus    . Total knee arthroplasty  2011    Right  . Appendectomy    . Ovarian cyst resection    . Back surgery      X5-lumbar-fusion  . Lymph node biopsy Right 01/21/2015    Procedure: RIGHT GROIN LYMPH NODE BIOPSY;  Surgeon: Erroll Luna, MD;  Location: [facility];  Service: General;  Laterality: Right;  . Colonoscopy    . Portacath placement Right 02/13/2015    Procedure: INSERTION PORT-A-CATH WITH ULTRASOUND;  Surgeon: Erroll Luna, MD;  Location: [place];  Service: General;  Laterality: Right;   HPI:  74 yo female adm to [facility] with sepsis.  Pt with PMH + for follicular lymphoma - undergoing chemotherapy.  Complications have included candidiasis, mucositis - s/p Diflucan, magic mouthwash, and Nystatin treatment. Pt reports improved comfort with eating but continued difficulty depending on what she eats.  Dry foods and pills are difficult for pt.  Swallow evaluation ordered by Md.     Assessment / Plan / Recommendation Clinical Impression  Pt presents with symptoms of mild oral  dysphagia - suspect due to her h/o mucositis and thrush.  CN exam negative and pt with no s/s of aspiration with all intake observed.  Pt is compensating well on her own by consuming liquids first, avoiding dry particulate foods and taking medicines with applesauce *crush if large.   Provided pt with written compensation strategies to mitigate her dysphagia due to mucositis, thrush.  Pt advised to this SLP that she is already following strategies.  Advised pt to monitor her swallowing and seek furhter treatment if does not resolve to baseline.  She reported understanding to information..  Will sign off, thanks for this consult.     Aspiration Risk  Mild    Diet Recommendation     Medication Administration: Whole meds with puree (or crush if large, start and follow w/w ater) Compensations: Slow rate;Small sips/bites (start intake with water, clean mouth after meals)    Other  Recommendations Oral Care Recommendations: Oral care before and after PO (pt has biotene at home)   Follow Up Recommendations       Frequency and Duration        Pertinent Vitals/Pain Afebrile, decreased      Swallow Study Prior Functional Status    see hhx   General Date of Onset: 07/01/15 Other Pertinent Information: 74 yo female adm to [facility] with sepsis.  Pt with PMH + for follicular lymphoma - undergoing chemotherapy.  Complications have included candidiasis, mucositis - s/p Diflucan, magic  mouthwash, and Nystatin treatment. Pt reports improved comfort with eating but continued difficulty depending on what she eats.  Dry foods and pills are difficult for pt.  Swallow evaluation ordered by Md.   Type of Study: Bedside swallow evaluation Diet Prior to this Study: Regular;Thin liquids Temperature Spikes Noted: No Respiratory Status: Room air History of Recent Intubation: No Behavior/Cognition: Alert;Cooperative;Pleasant mood Oral Cavity - Dentition: Adequate natural dentition/normal for age Self-Feeding  Abilities: Able to feed self Patient Positioning: Upright in bed Baseline Vocal Quality: Normal Volitional Cough: Strong Volitional Swallow: Able to elicit    Oral/Motor/Sensory Function Overall Oral Motor/Sensory Function: Appears within functional limits for tasks assessed   Ice Chips Ice chips: Within functional limits Presentation: Spoon   Thin Liquid Thin Liquid: Impaired Presentation: Straw Oral Phase Impairments: Other (comment) (delayed oral transiting)    Nectar Thick Nectar Thick Liquid: Not tested   Honey Thick Honey Thick Liquid: Not tested   Puree Puree: Not tested   Solid   GO    Solid: Impaired Presentation: Self Fed Oral Phase Impairments: Impaired anterior to posterior transit;Reduced lingual movement/coordination;Impaired mastication Oral Phase Functional Implications: Other (comment) (delayed oral transiting noted, suspect compensatory)      Luanna Salk, [place] [facility] SLP [phone]

## 2015-07-01 NOTE — Progress Notes (Signed)
Progress Note   Carrie [name] MCN:[id number] DOB: 1940/11/05 DOA: 06/25/2015 PCP: Darlyn Chamber, MD   Brief Narrative:   Carrie [name] is an 74 y.o. female with a PMH of follicular lymphoma grade 3A, under the care of Dr. Alvy Bimler, originally diagnosed 01/21/15 with initiation of R-CHOP chemotherapy 02/17/15 s/p 6 cycles with last cycle given 06/02/15, with subsequent follow-up PET scan showing near complete response to treatment, who was admitted 06/25/15 with chief complaint of weight loss, loss of appetite, nausea, and weakness. Upon initial evaluation in the ED, the patient was noted to have findings concerning for a UTI. A 12 lead EKG showed atrial fibrillation with RVR (no known history of same). The patient was placed on a Cardizem drip and cardiology consultation was requested. Blood cultures drawn in the ED are growing gram-negative rods.  Assessment/Plan:   Principal Problems:   Sepsis with gram-negative rod bacteremia/ E. Coli UTI - Blood and urine cultures positive for Escherichia coli, antibiotics narrowed from Zosyn to Ancef 06/27/15.   Acute kidney injury secondary to dehydration/hypernatremia/stage III chronic kidney disease - Creatinine continues to improve. Baseline creatinine 1.1. Current creatinine 1.2. Sodium normalized 06/29/15. - Creatinine and electrolytes stable with fluids at [facility].    Atrial fibrillation with RVR with demand ischemia - Cardizem drip ordered for rate control on admission, now on oral Cardizem 180 mg daily. - Troponin slightly elevated consistent with demand ischemia. TSH 0.851, WNL. - CHADS2-VASC score is 2 (high risk) with a 2.2% yearly risk of stroke, oral anticoagulation typically recommended. - Cardiology consultation performed 06/25/15. Agree with holding off on anticoagulation as risk outweighs benefit. - Continue aspirin.  - Limited 2 D Echo shows EF 55% with no regional wall motion abnormalities. - Maintaining NSR.  Telemetry discontinued  06/30/15.  Active Problems:   Left leg pain and swelling - Dopplers ordered to rule out DVT.   Hypokalemia - Potassium 3.4 this morning, will give 4 potassium runs today.   Oral and esophageal thrush/dysphasia - Continue Diflucan and nystatin oral suspension. Magic mouthwash added 06/28/15 for symptom control as needed. - Speech therapy consult/swallowing evaluation performed 07/01/15.   Hypothyroidism - Continue home dose of Synthroid. TSH WNL.   Follicular lymphoma grade 3a - Dr. Alvy Bimler signed off and will follow up with her as an outpatient.   Other fatigue - PT evaluation performed 06/29/15, SNF versus home with supervision/HHPT.   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   Anemia due to antineoplastic chemotherapy - 2 g drop in hemoglobin on admission consistent with hemodilution after hydration. Hemoglobin stable after initial drop.   Leukocytosis - Secondary to sepsis, continue antibiotics.  Trending down.   Nausea without vomiting - Antinausea medications ordered. - Continue PPI. - Appetite poor but nausea/vomiting seems to have resolved.   Hyperglycemia/new diagnosis diabetes - Hemoglobin A1c 6.8%, consistent with diabetes. - Currently being managed with SSI, Insulin sensitive scale with 3 units for meal coverage. CBGs 132-190. - Seen and counseled by Diabetes coordinator and dietitian.   DVT prophylaxis - Lovenox ordered.  Code Status: Full. Family Communication: Neighbors present in the room today, spoke with both son and daughter 06/30/15. Disposition Plan: Home when stable, likely 06/30/15.  IV Access:    Porta Cath   Procedures and diagnostic studies:   No results found.   Medical Consultants:    Oncology: [name], MD  Cardiology: Sanda Klein, MD  Anti-Infectives:    Zosyn 06/25/15--->06/29/15  Rocephin 06/25/15---> 06/26/15  Ancef 06/29/15--->  Subjective:   Carrie [name] reports improved  dysphagia. Appetite improved. Throat pain improved. Complains of left anterior knee pain which does not get worse with weightbearing.  Objective:    Filed Vitals:   06/30/15 0512 06/30/15 1405 06/30/15 2112 07/01/15 0620  BP: 157/92 138/74 142/72 148/77  Pulse: 78 78 80 77  Temp: 97.6 F (36.4 C) 98.1 F (36.7 C) 98.5 F (36.9 C) 97.4 F (36.3 C)  TempSrc: Oral Oral Oral Oral  Resp: 18 18 20 20   Height:      Weight:      SpO2: 97% 100% 100% 96%    Intake/Output Summary (Last 24 hours) at 07/01/15 0750 Last data filed at 06/30/15 1757  Gross per 24 hour  Intake    720 ml  Output      0 ml  Net    720 ml   Filed Weights   06/25/15 2000 06/26/15 0500  Weight: 63.9 kg (140 lb 14 oz) 62.6 kg (138 lb 0.1 oz)    Exam: Gen:  NAD Cardiovascular:  RRR, II/VI SEM Respiratory:  Lungs diminished Gastrointestinal:  Abdomen soft, NT/ND, + BS Extremities:  1-2 plus edema   Data Reviewed:    Labs: Basic Metabolic Panel:  Recent Labs Lab 06/26/15 0615 06/27/15 0500 06/28/15 0544 06/29/15 0500 07/01/15 0340  NA 155* 154* 149* 145 143  K 3.8 3.4* 4.2 4.2 3.4*  CL 121* 121* 117* 112* 109  CO2 25 26 25 25 25   GLUCOSE 175* 186* 230* 219* 215*  BUN 46* 35* 25* 20 13  CREATININE 2.29* 1.86* 1.67* 1.43* 1.22*  CALCIUM 8.0* 7.4* 7.6* 7.9* 8.2*   GFR Estimated Creatinine Clearance: 35.8 mL/min (by C-G formula based on Cr of 1.22). Liver Function Tests:  Recent Labs Lab 06/25/15 1215  AST 157*  ALT 106*  ALKPHOS 106  BILITOT 0.7  PROT 6.2*  ALBUMIN 2.8*   CBC:  Recent Labs Lab 06/26/15 0615 06/27/15 0500 06/28/15 0544 06/29/15 0500 07/01/15 0340  WBC 18.2* 16.1* 14.8* 11.9* 10.8*  HGB 9.6* 9.4* 10.3* 10.2* 9.5*  HCT 30.9* 29.6* 32.9* 32.9* 29.7*  MCV 98.7 99.7 99.1 99.4 98.0  PLT 322 278 320 327 314   Cardiac Enzymes:  Recent Labs Lab 06/25/15 1633 06/25/15 2127 06/26/15 0615  TROPONINI 0.96* 0.69* 0.56*   CBG:  Recent Labs Lab 06/29/15 2157  06/30/15 0719 06/30/15 1230 06/30/15 1635 06/30/15 2115  GLUCAP 162* 190* 156* 132* 134*   Sepsis Labs:  Recent Labs Lab 06/25/15 1240  06/27/15 0500 06/28/15 0544 06/29/15 0500 07/01/15 0340  WBC  --   < > 16.1* 14.8* 11.9* 10.8*  LATICACIDVEN 1.39  --   --   --   --   --   < > = values in this interval not displayed. Microbiology Recent Results (from the past 240 hour(s))  Culture, blood (single)     Status: None   Collection Time: 06/25/15 12:00 PM  Result Value Ref Range Status   Specimen Description BLOOD CENTRAL LINE  Final   Special Requests BOTTLES DRAWN AEROBIC AND ANAEROBIC 5CC  Final   Culture  Setup Time   Final    GRAM NEGATIVE RODS IN BOTH AEROBIC AND ANAEROBIC BOTTLES CRITICAL RESULT CALLED TO, READ BACK BY AND VERIFIED WITH: S EARLY@0518  06/26/15 MKELLY    Culture   Final    ESCHERICHIA COLI Performed at [facility]    Report Status 06/29/2015 FINAL  Final   Organism  ID, Bacteria ESCHERICHIA COLI  Final      Susceptibility   Escherichia coli - MIC*    AMPICILLIN >=32 RESISTANT Resistant     CEFAZOLIN <=4 SENSITIVE Sensitive     CEFEPIME <=1 SENSITIVE Sensitive     CEFTAZIDIME <=1 SENSITIVE Sensitive     CEFTRIAXONE <=1 SENSITIVE Sensitive     CIPROFLOXACIN <=0.25 SENSITIVE Sensitive     GENTAMICIN <=1 SENSITIVE Sensitive     IMIPENEM <=0.25 SENSITIVE Sensitive     TRIMETH/SULFA <=20 SENSITIVE Sensitive     AMPICILLIN/SULBACTAM >=32 RESISTANT Resistant     PIP/TAZO <=4 SENSITIVE Sensitive     * ESCHERICHIA COLI  Culture, Urine     Status: None   Collection Time: 06/25/15  3:35 PM  Result Value Ref Range Status   Specimen Description URINE, CLEAN CATCH  Final   Special Requests NONE  Final   Culture   Final    >=100,000 COLONIES/mL ESCHERICHIA COLI Performed at [facility]    Report Status 06/27/2015 FINAL  Final   Organism ID, Bacteria ESCHERICHIA COLI  Final      Susceptibility   Escherichia coli - MIC*    AMPICILLIN >=32  RESISTANT Resistant     CEFAZOLIN <=4 SENSITIVE Sensitive     CEFTRIAXONE <=1 SENSITIVE Sensitive     CIPROFLOXACIN <=0.25 SENSITIVE Sensitive     GENTAMICIN <=1 SENSITIVE Sensitive     IMIPENEM <=0.25 SENSITIVE Sensitive     NITROFURANTOIN <=16 SENSITIVE Sensitive     TRIMETH/SULFA <=20 SENSITIVE Sensitive     AMPICILLIN/SULBACTAM >=32 RESISTANT Resistant     PIP/TAZO <=4 SENSITIVE Sensitive     * >=100,000 COLONIES/mL ESCHERICHIA COLI  MRSA PCR Screening     Status: None   Collection Time: 06/25/15  6:54 PM  Result Value Ref Range Status   MRSA by PCR NEGATIVE NEGATIVE Final    Comment:        The GeneXpert MRSA Assay (FDA approved for NASAL specimens only), is one component of a comprehensive MRSA colonization surveillance program. It is not intended to diagnose MRSA infection nor to guide or monitor treatment for MRSA infections.      Medications:   . aspirin  81 mg Oral Daily  . calcium carbonate  2 tablet Oral Daily  .  ceFAZolin (ANCEF) IV  1 g Intravenous Q12H  . diltiazem  180 mg Oral Daily  . enoxaparin (LOVENOX) injection  30 mg Subcutaneous Q24H  . feeding supplement (ENSURE ENLIVE)  237 mL Oral BID BM  . fluconazole (DIFLUCAN) IV  200 mg Intravenous Q24H  . insulin aspart  0-5 Units Subcutaneous QHS  . insulin aspart  0-9 Units Subcutaneous TID WC  . insulin aspart  3 Units Subcutaneous TID WC  . levothyroxine  100 mcg Oral QAC breakfast  . loratadine  10 mg Oral Daily  . multivitamin with minerals  1 tablet Oral Daily  . nystatin  5 mL Oral QID  . pantoprazole  40 mg Oral Q0600   Continuous Infusions: . sodium chloride 10 mL/hr at 06/30/15 1044    Time spent: 25 minutes.    LOS: 6 days   [facility] Pager [phone]. If unable to reach me by pager, please call my cell phone at [phone].  *Please refer to [email or web address], password [email or web address] to get updated schedule on who will round on this patient, as hospitalists switch teams  weekly. If 7PM-7AM, please contact night-coverage at [email or web address], password [email or web address] for any overnight needs.  07/01/2015, 7:50 AM

## 2015-07-02 DIAGNOSIS — E870 Hyperosmolality and hypernatremia: Secondary | ICD-10-CM | POA: Diagnosis present

## 2015-07-02 DIAGNOSIS — R7989 Other specified abnormal findings of blood chemistry: Secondary | ICD-10-CM

## 2015-07-02 DIAGNOSIS — A4181 Sepsis due to Enterococcus: Secondary | ICD-10-CM

## 2015-07-02 DIAGNOSIS — R7881 Bacteremia: Secondary | ICD-10-CM | POA: Diagnosis present

## 2015-07-02 DIAGNOSIS — E876 Hypokalemia: Secondary | ICD-10-CM | POA: Diagnosis present

## 2015-07-02 DIAGNOSIS — I10 Essential (primary) hypertension: Secondary | ICD-10-CM | POA: Diagnosis present

## 2015-07-02 DIAGNOSIS — R778 Other specified abnormalities of plasma proteins: Secondary | ICD-10-CM | POA: Diagnosis present

## 2015-07-02 DIAGNOSIS — N179 Acute kidney failure, unspecified: Secondary | ICD-10-CM | POA: Diagnosis present

## 2015-07-02 DIAGNOSIS — R627 Adult failure to thrive: Secondary | ICD-10-CM | POA: Diagnosis present

## 2015-07-02 DIAGNOSIS — E875 Hyperkalemia: Secondary | ICD-10-CM | POA: Diagnosis present

## 2015-07-02 DIAGNOSIS — E440 Moderate protein-calorie malnutrition: Secondary | ICD-10-CM | POA: Diagnosis present

## 2015-07-02 DIAGNOSIS — A4151 Sepsis due to Escherichia coli [E. coli]: Secondary | ICD-10-CM | POA: Diagnosis present

## 2015-07-02 DIAGNOSIS — T451X5A Adverse effect of antineoplastic and immunosuppressive drugs, initial encounter: Secondary | ICD-10-CM

## 2015-07-02 DIAGNOSIS — D638 Anemia in other chronic diseases classified elsewhere: Secondary | ICD-10-CM | POA: Diagnosis present

## 2015-07-02 DIAGNOSIS — R6520 Severe sepsis without septic shock: Secondary | ICD-10-CM

## 2015-07-02 DIAGNOSIS — B9620 Unspecified Escherichia coli [E. coli] as the cause of diseases classified elsewhere: Secondary | ICD-10-CM | POA: Diagnosis present

## 2015-07-02 LAB — CREATININE, SERUM
Creatinine, Ser: 1.34 mg/dL — ABNORMAL HIGH (ref 0.44–1.00)
GFR calc Af Amer: 45 mL/min — ABNORMAL LOW (ref >60)
GFR calc non Af Amer: 39 mL/min — ABNORMAL LOW (ref >60)

## 2015-07-02 LAB — GLUCOSE, CAPILLARY
Glucose-Capillary: 149 mg/dL — ABNORMAL HIGH (ref 65–99)
Glucose-Capillary: 144 mg/dL — ABNORMAL HIGH (ref 65–99)
Glucose-Capillary: 133 mg/dL — ABNORMAL HIGH (ref 65–99)
Glucose-Capillary: 161 mg/dL — ABNORMAL HIGH (ref 65–99)

## 2015-07-02 NOTE — Progress Notes (Addendum)
Patient ID: Carrie [name], female   DOB: [birth year], 74 y.o.   MRN: [id number] [facility] PROGRESS NOTE  MADDIE BRAZIER NOM:[id number] DOB: [birth year] DOA: 06/25/2015 PCP: Darlyn Chamber, MD  Brief narrative:    74 y.o. female with a past medical history of follicular lymphoma grade 3A, under the care of Dr. Alvy Bimler, initially diagnosed 01/21/15 with initiation of R-CHOP chemotherapy 02/17/15 s/p 6 cycles (last cycle given 06/02/15). Patient had a follow-up PET scan which showed near complete response to treatment. Patient was admitted 06/25/2015 with reports of weight loss, failure to thrive, weakness. She was found to have urinary tract infection. Hospital course was further complicated with sepsis secondary to Escherichia coli bacteremia and UTI. Additionally, she was found to have atrial fibrillation with RVR and has required Cardizem drip. Cardiology has seen the patient in consultation.  Anticipated discharge: To skilled nursing facility likely by 07/03/2015. We will also obtain repeat blood culture to ensure clearance of Escherichia coli bacteremia noted on blood cultures collected 06/25/2015.   Assessment/Plan:    Principal Problems: Sepsis secondary to Escherichia coli bacteremia with acute renal failure / Escherichia coli UTI / leukocytosis - Blood cultures collected on the admission 06/25/2015 are growing Escherichia coli. In addition urine culture is growing Escherichia coli. - Patient was initially on Zosyn and Rocephin. Abx narrowed down to Ancef. - We will obtain repeat blood cultures today 07/02/2015 to ensure clearance of Escherichia coli bacteremia - Patient is hemodynamically stable at this time. She does not require pressor support.  Active problems: Acute kidney injury - Likely related to sepsis and Escherichia coli bacteremia and Escherichia coli UTI - Creatinine is 1.34 this morning. Overall stable and improving since the admission. Creatinine was 2.29 on the  admission. It has likely improved with treatment of sepsis and bacteremia as well as with hydration.  Hypokalemia - Likely from sepsis. - Supplemented. Check BMP tomorrow morning.  Hypernatremia - Likely secondary to dehydration. - Sodium has normalized with IV fluids    Follicular lymphoma grade 3a - Patient follows with Dr. Alvy Bimler on outpatient basis - As noted above, PET scan demonstrated nearly complete response to treatment  Mucositis due to chemotherapy / Thrush of mouth and esophagus - Continue Diflucan and nystatin swish and swallow  Anemia due to antineoplastic chemotherapy / Anemia of chronic disease - Anemia secondary to history of malignancy and sequela of chemotherapy - Hemoglobin remains stable at 9.5 - No current indications for transfusion  Hypothyroidism - Continue Synthroid - TSH within normal limit  Nausea without vomiting - Likely combination of sepsis and bacteremia and UTI - Improving    Atrial fibrillation with rapid ventricular response - CHADS2-VASC score is 2 (high risk) with a 2.2% yearly risk of stroke, oral anticoagulation typically recommended. - Patient has been seen by cardiology in consultation who recommended holding off on anticoagulation because risk outweighs the benefit - Patient required Cardizem drip but currently is on Cardizem 180 mg daily - Heart rate is 88 - 2-D echo showed preserved ejection fraction with no wall motion abnormalities  Diabetes mellitus, likely due to steroids - A1c is 6.8 indicating diabetes. Likely secondary to steroids. - CBGs in past 24 hours: 153, 121, 149 - Continue novolog 3 units 3 times daily along with sliding scale insulin  Essential hypertension - Continue Cardizem    Troponin level elevated - Likely demand ischemia from acute renal failure and sepsis - Troponin level on 06/26/2015 was 0.5, mildly elevated and improved since admission - No  reports of chest pain - Continue daily  aspirin  Moderate protein-calorie malnutrition / Failure to thrive in adult / Dehydration - In the context of chronic illness. - Patient was seen by nutritionist - Continue nutritional supplementation    Neuropathy due to chemotherapeutic drug - Pain management as needed.   DVT Prophylaxis  - Lovenox subcutaneous ordered while patient in hospital   Code Status: Full.  Family Communication:  plan of care discussed with the patient and her daughter at the bedside Disposition Plan: Patient's daughter explains to me that patient lives at home by herself and is too weak to go home. We will have physical therapy reevaluate the patient. Plan is for skilled nursing facility placement depending on PT evaluation. Anticipated discharge by 07/03/2015.  IV access:  Port-A-Cath  Procedures and diagnostic studies:    Dg Knee 1-2 Views Left 07/01/2015   2 compartment osteoarthritis with small suprapatellar joint effusion.  Heterogeneous lucency within the proximal tibia. Although this could be postsurgical, given the history of patellar tendon transplant, lymphomatous involvement cannot be excluded. Consider full four view knee series versus further evaluation with pre and post-contrast MRI.   Medical Consultants:  Oncology: [name], MD Cardiology: Sanda Klein, MD  Other Consultants:  Physical therapy  IAnti-Infectives:   Zosyn 06/25/15 --> 06/29/15 Rocephin 06/25/15 --> 06/26/15 Ancef 06/29/15 -->   Leisa Lenz, MD  [facility] Pager [phone]  Time spent in minutes: 25 minutes  If 7PM-7AM, please contact night-coverage [email or web address] Password [email or web address] 07/02/2015, 11:21 AM   LOS: 7 days    HPI/Subjective: No acute overnight events. Patient reports left knee pain, 5 out of 10 in intensity.  Objective: Filed Vitals:   07/01/15 0620 07/01/15 1500 07/01/15 2129 07/02/15 0435  BP: 148/77 144/98 146/78   Pulse: 77 86 88   Temp: 97.4 F (36.3 C) 98 F (36.7 C) 98.3 F (36.8 C)    TempSrc: Oral Axillary Oral   Resp: 20 18 18    Height:      Weight:    62.506 kg (137 lb 12.8 oz)  SpO2: 96% 97% 96%     Intake/Output Summary (Last 24 hours) at 07/02/15 1121 Last data filed at 07/01/15 1900  Gross per 24 hour  Intake    310 ml  Output      0 ml  Net    310 ml    Exam:   General:  Pt is alert, follows commands appropriately, not in acute distress  Cardiovascular: Regular rate and rhythm, S1/S2 appreciated  Respiratory: Clear to auscultation bilaterally, no wheezing, no crackles, no rhonchi  Abdomen: Soft, non tender, non distended, bowel sounds present  Extremities: No edema, pulses DP and PT palpable bilaterally; tenderness over the left knee  Neuro: Grossly nonfocal  Data Reviewed: Basic Metabolic Panel:  Recent Labs Lab 06/26/15 0615 06/27/15 0500 06/28/15 0544 06/29/15 0500 07/01/15 0340 07/02/15 0501  NA 155* 154* 149* 145 143  --   K 3.8 3.4* 4.2 4.2 3.4*  --   CL 121* 121* 117* 112* 109  --   CO2 25 26 25 25 25   --   GLUCOSE 175* 186* 230* 219* 215*  --   BUN 46* 35* 25* 20 13  --   CREATININE 2.29* 1.86* 1.67* 1.43* 1.22* 1.34*  CALCIUM 8.0* 7.4* 7.6* 7.9* 8.2*  --    Liver Function Tests:  Recent Labs Lab 06/25/15 1215  AST 157*  ALT 106*  ALKPHOS 106  BILITOT 0.7  PROT 6.2*  ALBUMIN 2.8*   No results for input(s): LIPASE, AMYLASE in the last 168 hours. No results for input(s): AMMONIA in the last 168 hours. CBC:  Recent Labs Lab 06/26/15 0615 06/27/15 0500 06/28/15 0544 06/29/15 0500 07/01/15 0340  WBC 18.2* 16.1* 14.8* 11.9* 10.8*  HGB 9.6* 9.4* 10.3* 10.2* 9.5*  HCT 30.9* 29.6* 32.9* 32.9* 29.7*  MCV 98.7 99.7 99.1 99.4 98.0  PLT 322 278 320 327 314   Cardiac Enzymes:  Recent Labs Lab 06/25/15 1633 06/25/15 2127 06/26/15 0615  TROPONINI 0.96* 0.69* 0.56*   BNP: Invalid input(s): POCBNP CBG:  Recent Labs Lab 07/01/15 0838 07/01/15 1240 07/01/15 1748 07/01/15 2140 07/02/15 0725  GLUCAP  234* 90 153* 121* 149*    Culture, blood (single)     Status: None   Collection Time: 06/25/15 12:00 PM  Result Value Ref Range Status   Specimen Description BLOOD CENTRAL LINE  Final   Special Requests BOTTLES DRAWN AEROBIC AND ANAEROBIC 5CC  Final   Culture  Setup Time   Final   Culture   Final   Report Status 06/29/2015 FINAL  Final   Organism ID, Bacteria ESCHERICHIA COLI  Final      Susceptibility   Escherichia coli - MIC*    AMPICILLIN >=32 RESISTANT Resistant     CEFAZOLIN <=4 SENSITIVE Sensitive     CEFEPIME <=1 SENSITIVE Sensitive     CEFTAZIDIME <=1 SENSITIVE Sensitive     CEFTRIAXONE <=1 SENSITIVE Sensitive     CIPROFLOXACIN <=0.25 SENSITIVE Sensitive     GENTAMICIN <=1 SENSITIVE Sensitive     IMIPENEM <=0.25 SENSITIVE Sensitive     TRIMETH/SULFA <=20 SENSITIVE Sensitive     AMPICILLIN/SULBACTAM >=32 RESISTANT Resistant     PIP/TAZO <=4 SENSITIVE Sensitive     * ESCHERICHIA COLI  Culture, Urine     Status: None   Collection Time: 06/25/15  3:35 PM  Result Value Ref Range Status   Specimen Description URINE, CLEAN CATCH  Final   Special Requests NONE  Final   Culture   Final   Report Status 06/27/2015 FINAL  Final   Organism ID, Bacteria ESCHERICHIA COLI  Final      Susceptibility   Escherichia coli - MIC*    AMPICILLIN >=32 RESISTANT Resistant     CEFAZOLIN <=4 SENSITIVE Sensitive     CEFTRIAXONE <=1 SENSITIVE Sensitive     CIPROFLOXACIN <=0.25 SENSITIVE Sensitive     GENTAMICIN <=1 SENSITIVE Sensitive     IMIPENEM <=0.25 SENSITIVE Sensitive     NITROFURANTOIN <=16 SENSITIVE Sensitive     TRIMETH/SULFA <=20 SENSITIVE Sensitive     AMPICILLIN/SULBACTAM >=32 RESISTANT Resistant     PIP/TAZO <=4 SENSITIVE Sensitive     * >=100,000 COLONIES/mL ESCHERICHIA COLI  MRSA PCR Screening     Status: None   Collection Time: 06/25/15  6:54 PM  Result Value Ref Range Status   MRSA by PCR NEGATIVE NEGATIVE Final     Scheduled Meds: . aspirin  81 mg Oral Daily  .  calcium carbonate  2 tablet Oral Daily  . ceFAZolin (ANCEF) IV  2 g Intravenous 3 times per day  . diltiazem  180 mg Oral Daily  . enoxaparin (LOVENOX) injection  40 mg Subcutaneous Q24H  . feeding supplement (ENSURE ENLIVE)  237 mL Oral BID BM  . fluconazole  100 mg Oral Daily  . insulin aspart  0-5 Units Subcutaneous QHS  . insulin aspart  0-9 Units Subcutaneous TID WC  . insulin aspart  3  Units Subcutaneous TID WC  . levothyroxine  100 mcg Oral QAC breakfast  . loratadine  10 mg Oral Daily  . multivitamin with   1 tablet Oral Daily  . nystatin  5 mL Oral QID  . pantoprazole  40 mg Oral Q0600

## 2015-07-02 NOTE — Progress Notes (Signed)
Physical Therapy Treatment Patient Details Name: Carrie [name] MRN: [id number] DOB: [birth year] Today's Date: 07/02/2015    History of Present Illness Carrie [name] is an 74 y.o. female with a PMH of follicular lymphoma grade 3A, last chemo  cycle given 06/02/15, with subsequent follow-up PET scan showing near complete response to treatment. She was evaluated at the cancer center on 06/18/15 secondary to diarrhea, dizziness and mild nausea. She has not had much of an appetite since then. She was documented to have lost 5 pounds in a few days. She received IV fluids at the cancer center on 8/31, 9/1, 9/2 and 9/3 but despite this, she has continued to feel weak. She has had abdominal discomfort/dyspepsia per her family, but denies any specific symptoms. Upon initial evaluation in the ED 06/25/15,  the patient was noted to have findings concerning for a UTI. A 12 lead EKG showed atrial fibrillation with RVR (no known history of same).     PT Comments    Pt progressing well.  Daughter has concerns about D/C plans as pt lives home alone.    Follow Up Recommendations  Supervision for mobility/OOB;SNF     Equipment Recommendations  Rolling walker with 5" wheels    Recommendations for Other Services       Precautions / Restrictions Precautions Precautions: Fall Restrictions Weight Bearing Restrictions: No    Mobility  Bed Mobility Overal bed mobility: Needs Assistance Bed Mobility: Supine to Sit;Sit to Supine     Supine to sit: Supervision Sit to supine: Supervision   General bed mobility comments: incr time, supervision for safety  Transfers Overall transfer level: Needs assistance Equipment used: Rolling walker (2 wheeled) Transfers: Sit to/from Stand Sit to Stand: Supervision         General transfer comment: cues for safety  Ambulation/Gait Ambulation/Gait assistance: Supervision;Min guard Ambulation Distance (Feet): 250 Feet Assistive device: Rolling walker (2  wheeled) Gait Pattern/deviations: Step-to pattern Gait velocity: WFL   General Gait Details: cues for RW position and safety, occasional assist/guiding for RW direction to maneuver in smaller spaces   Stairs            Wheelchair Mobility    Modified Rankin (Stroke Patients Only)       Balance                                    Cognition Arousal/Alertness: Awake/alert Behavior During Therapy: WFL for tasks assessed/performed                        Exercises      General Comments        Pertinent Vitals/Pain Pain Assessment: No/denies pain    Home Living                      Prior Function            PT Goals (current goals can now be found in the care plan section) Progress towards PT goals: Progressing toward goals    Frequency  Min 3X/week    PT Plan      Co-evaluation             End of Session Equipment Utilized During Treatment: Gait belt Activity Tolerance: Patient tolerated treatment well Patient left: in bed;with call bell/phone within reach;with family/visitor present     Time: 1135-1200 PT Time Calculation (min) (  ACUTE ONLY): 25 min  Charges:  $Gait Training: 8-22 mins $Therapeutic Activity: 8-22 mins                    G Codes:      Rica Koyanagi  PTA [facility]  Acute  Rehab Pager      [phone]

## 2015-07-02 NOTE — Progress Notes (Signed)
Clinical Social Work  CSW received call from RN reporting family had questions about SNF placement. Per chart review, patient has no wound care, will be switched to PO antibiotics at DC and is only supervision/min guard and ambulating 250 feet with PT. CSW went to room to explain no skilled needs at this time. Family reports that patient lives alone and dtrs live out of town so she will need 24/7 care. CSW explained that insurance cannot cover custodial care and that if they are still interested in placement then CSW could assist with finding a facility with patient paying privately. Family reports they want to appeal hospital stay and SNF process and desires to speak with CM about appeal process. CSW will update CM and will continue to follow.  [place],  [phone]

## 2015-07-02 NOTE — Progress Notes (Signed)
Initial Nutrition Assessment  DOCUMENTATION CODES:   Not applicable  INTERVENTION:  - Continue Ensure Enlive BID - RD will continue to monitor for needs  NUTRITION DIAGNOSIS:   Predicted suboptimal nutrient intake related to catabolic illness as evidenced by estimated needs. - resolved, intakes much improved  GOAL:   Patient will meet greater than or equal to 90% of their needs -met on average the past few days  MONITOR:   PO intake, Supplement acceptance, Labs, Skin, I & O's  ASSESSMENT:   74 yo female with PMH of STG 3A Lymphoma, received 6 cycles of chemo, follow up pet scan found near complete response to treatment. Presented to cancer center with weakness 8/31. Received IV fluids inpatient for 3 days, d/c, still felt weak. Admitted on 9/7 for weakness, family reports abdominal pan;  Receiving treatment for UTI, also newly diagnosed diabetic and AFib. Pt did not appear malnourished following NPFE, reports she eats regularly, ok appetite, no wt loss. Pt is very positive, pleasant, polite. Also educated on T2DM, pt was eager and responsive. Will provide EEx2 to help with UTI. Follow for supplement acceptance.  Needs re-estimated based on PMH and current medical course. Pt has been eating 50-100% of all meals the past few days. She reports that her appetite is much improved and that she really likes the food. Daughter, who is at bedside states that this is a noticeable difference from last week and that pt has been eating very well.  Pt states that she tolerates softer foods better. She indicates that she sometimes needs to "swallow hard" in order to get food and drinks down. She denies any pain with swallowing; noted dx of oral and esophageal thrush.  Likely meeting needs on average. Pt to d/c to SNF, possibly by tomorrow. Medications reviewed. Labs reviewed; CBGs: 90-234 mg/dL, K: 3.4 mmol/L, creatinine elevated, Ca: 8.2 mg/dL, GFR: 39.   Diet Order:  Diet Carb Modified Fluid  consistency:: Thin; Room service appropriate?: Yes  Skin:  Reviewed, no issues  Last BM:  9/12  Height:   Ht Readings from Last 1 Encounters:  06/25/15 5' 1.5" (1.562 m)    Weight:   Wt Readings from Last 1 Encounters:  07/02/15 137 lb 12.8 oz (62.506 kg)    Ideal Body Weight:  47.72 kg  BMI:  Body mass index is 25.62 kg/(m^2).  Estimated Nutritional Needs:   Kcal:  1700-1900  Protein:  70-80 grams  Fluid:  2 L/day  EDUCATION NEEDS:   Education needs addressed     Jarome Matin, RD, LDN Inpatient Clinical Dietitian Pager # [phone] After hours/weekend pager # [phone]

## 2015-07-03 DIAGNOSIS — D638 Anemia in other chronic diseases classified elsewhere: Secondary | ICD-10-CM

## 2015-07-03 DIAGNOSIS — E039 Hypothyroidism, unspecified: Secondary | ICD-10-CM

## 2015-07-03 LAB — BASIC METABOLIC PANEL
Anion gap: 9 (ref 5–15)
BUN: 10 mg/dL (ref 6–20)
CO2: 25 mmol/L (ref 22–32)
Calcium: 8.4 mg/dL — ABNORMAL LOW (ref 8.9–10.3)
Chloride: 104 mmol/L (ref 101–111)
Creatinine, Ser: 1.24 mg/dL — ABNORMAL HIGH (ref 0.44–1.00)
GFR calc Af Amer: 49 mL/min — ABNORMAL LOW (ref >60)
GFR calc non Af Amer: 42 mL/min — ABNORMAL LOW (ref >60)
Glucose, Bld: 169 mg/dL — ABNORMAL HIGH (ref 65–99)
Potassium: 3.7 mmol/L (ref 3.5–5.1)
Sodium: 138 mmol/L (ref 135–145)

## 2015-07-03 LAB — CBC
HCT: 29.2 % — ABNORMAL LOW (ref 36.0–46.0)
Hemoglobin: 9.3 g/dL — ABNORMAL LOW (ref 12.0–15.0)
MCH: 30.8 pg (ref 26.0–34.0)
MCHC: 31.8 g/dL (ref 30.0–36.0)
MCV: 96.7 fL (ref 78.0–100.0)
Platelets: 367 10*3/uL (ref 150–400)
RBC: 3.02 MIL/uL — ABNORMAL LOW (ref 3.87–5.11)
RDW: 15.8 % — ABNORMAL HIGH (ref 11.5–15.5)
WBC: 13.8 10*3/uL — ABNORMAL HIGH (ref 4.0–10.5)

## 2015-07-03 LAB — GLUCOSE, CAPILLARY
Glucose-Capillary: 164 mg/dL — ABNORMAL HIGH (ref 65–99)
Glucose-Capillary: 129 mg/dL — ABNORMAL HIGH (ref 65–99)
Glucose-Capillary: 132 mg/dL — ABNORMAL HIGH (ref 65–99)
Glucose-Capillary: 164 mg/dL — ABNORMAL HIGH (ref 65–99)

## 2015-07-03 NOTE — Progress Notes (Signed)
Patient ID: Carrie [name], female   DOB: August 26, 1941, 74 y.o.   MRN: [id number] [facility] PROGRESS NOTE  Carrie [name] [id number] DOB: 03-19-1941 DOA: 06/25/2015 PCP: Darlyn Chamber, MD  Brief narrative:    74 y.o. female with a past medical history of follicular lymphoma grade 3A, under the care of Dr. Alvy Bimler, initially diagnosed 01/21/15 with initiation of R-CHOP chemotherapy 02/17/15 s/p 6 cycles (last cycle given 06/02/15). Patient had a follow-up PET scan which showed near complete response to treatment. Patient was admitted 06/25/2015 with reports of weight loss, failure to thrive and weakness.  On admission, patient was found to have atrial fibrillation with RVR and has required Cardizem drip. She has been seen by cardiology in consultation. She is currently on oral Cardizem for rate control. During this hospital stay patient was also found to have Escherichia coli UTI as well as Escherichia coli bacteremia for which she is receiving Rocephin.  Barrier to discharge: Patient and her daughter at the bedside feel that patient should go to skilled nursing facility however per physical therapy evaluation patient does not have skilled nursing facility needs at this time. Patient and her daughter feel that patient cannot go home as she lives by herself and home health orders may not be sufficient for what she needs. Our Education officer, museum and case manager have explained to the patient that patient can go to SNF with private pay but family did not accept this.  At this time we are awaiting repeat blood culture report to ensure clearance of bacteremia. Once the blood culture result is reported patient will be stable for discharge, anticipate in the next 24 hours, 07/04/2015.   Assessment/Plan:    Principal Problems: Sepsis secondary to Escherichia coli bacteremia with acute renal failure / Escherichia coli UTI / leukocytosis - Sepsis criteria met on the admission. Source of infection initially  thought to be urinary tract infection. We now have information that blood cultures which were collected on 06/25/2015 are growing Escherichia coli. Urine culture from the admission is also growing Escherichia coli. - Blood cultures obtained 07/02/2015, recollected to ensure clearance of bacteremia. - Patient was on Zosyn and Rocephin from the time of the admission. This was narrowed down to Ancef 06/29/2015. - Sepsis resolved at this time. Patient is hemodynamically stable.  Active problems: Acute kidney injury - Secondary to sepsis, bacteremia, urinary tract infection. - Creatinine on admission was 2.29 but with fluids and resolution of sepsis creatinine has trended down. - Creatinine is 1.24 this morning.  Hypokalemia - Secondary to sepsis. - Supplemented.  - Potassium within normal limits this morning.  Hypernatremia - Likely pre-renal etiology secondary to dehydration. - Sodium normalized with IV fluids.    Follicular lymphoma grade 3a - Patient follows with Dr. Alvy Bimler on outpatient basis - As noted above, PET scan demonstrated nearly complete response to treatment  Mucositis due to chemotherapy / Thrush of mouth and esophagus - Continue Diflucan and nystatin swish and swallow - Patient's daughter requested swallowing evaluation however this morning patient reports she is able to swallow without difficulty. No coughing after eating. She tolerates current diet. I do not think that swallowing evaluation is needed at this time.  Anemia due to antineoplastic chemotherapy / Anemia of chronic disease - Anemia likely related to history of malignancy, sequela of chemotherapy - Hemoglobin remains stable. - No reports of bleeding  Hypothyroidism - TSH within normal limit - We will continue Synthroid 100 g daily  Nausea without vomiting - Likely secondary  to acute infection - Improved with fluids.    Atrial fibrillation with rapid ventricular response - CHADS2-VASC score is 2  (high risk) with a 2.2% yearly risk of stroke, oral anticoagulation typically recommended. - Cardiology has seen the patient in consultation. Per cardiology, continue to hold off on anticoagulation because of risk outweighs the benefit. - Patient initially on Cardizem drip but this was switched to Cardizem 180 mg daily. Heart rate controlled. - 2-D echo showed preserved ejection fraction with no wall motion abnormalities  Diabetes mellitus, likely due to steroids - A1c is 6.8 indicating diabetes. Likely exacerbated by steroids. - Continue NovoLog 3 units 3 times daily along with sliding scale insulin.  Essential hypertension - Continue Cardizem 180 mg daily     Troponin level elevated - Likely demand ischemia from acute renal failure and sepsis - Troponin level on 06/26/2015 - 0.5. Mild elevation and as mentioned likely from acute renal failure and sepsis. - Continue daily aspirin  Moderate protein-calorie malnutrition / Failure to thrive in adult / Dehydration - In the context of chronic illness. - Continue nutritional supplementation    Neuropathy due to chemotherapeutic drug - Continue pain management efforts.   DVT Prophylaxis  - Lovenox subcutaneous ordered in hospital.   Code Status: Full.  Family Communication:  plan of care discussed with the patient and her daughter at the bedside Disposition Plan: Likely home by 07/04/2015.  IV access:  Port-A-Cath  Procedures and diagnostic studies:    Dg Knee 1-2 Views Left 07/01/2015   2 compartment osteoarthritis with small suprapatellar joint effusion.  Heterogeneous lucency within the proximal tibia. Although this could be postsurgical, given the history of patellar tendon transplant, lymphomatous involvement cannot be excluded. Consider full four view knee series versus further evaluation with pre and post-contrast MRI.   Medical Consultants:  Oncology: [name], MD Cardiology: Sanda Klein, MD  Other Consultants:   Physical therapy  IAnti-Infectives:   Zosyn 06/25/15 --> 06/29/15 Rocephin 06/25/15 --> 06/26/15 Ancef 06/29/15 -->   Leisa Lenz, MD  [facility] Pager [phone]  Time spent in minutes: 25 minutes  If 7PM-7AM, please contact night-coverage [email or web address] Password [email or web address] 07/03/2015, 11:11 AM   LOS: 8 days    HPI/Subjective: No acute overnight events. Patient reports no nausea or vomiting this morning.  Objective: Filed Vitals:   07/02/15 1412 07/02/15 2101 07/03/15 0508 07/03/15 0912  BP: 120/63 145/75 121/74 136/66  Pulse: 91 103 92 95  Temp: 98.8 F (37.1 C) 98.6 F (37 C) 97.8 F (36.6 C) 97.7 F (36.5 C)  TempSrc: Oral Oral Oral Oral  Resp: '18 20 20   ' Height:      Weight:      SpO2: 95% 98% 96% 98%    Intake/Output Summary (Last 24 hours) at 07/03/15 1111 Last data filed at 07/03/15 0500  Gross per 24 hour  Intake 2792.67 ml  Output      0 ml  Net 2792.67 ml    Exam:   General:  Pt is alert, no distress  Cardiovascular: Rate controlled, S1/S2 appreciated  Respiratory: Bilateral air entry, no wheezing  Abdomen: Nontender abdomen, non-distended, appreciate bowel sounds  Extremities: No lower extremity edema, pulses palpable bilaterally  Neuro: No focal neurologic deficits  Data Reviewed: Basic Metabolic Panel:  Recent Labs Lab 06/27/15 0500 06/28/15 0544 06/29/15 0500 07/01/15 0340 07/02/15 0501 07/03/15 0505  NA 154* 149* 145 143  --  138  K 3.4* 4.2 4.2 3.4*  --  3.7  CL 121*  117* 112* 109  --  104  CO2 '26 25 25 25  ' --  25  GLUCOSE 186* 230* 219* 215*  --  169*  BUN 35* 25* 20 13  --  10  CREATININE 1.86* 1.67* 1.43* 1.22* 1.34* 1.24*  CALCIUM 7.4* 7.6* 7.9* 8.2*  --  8.4*   Liver Function Tests: No results for input(s): AST, ALT, ALKPHOS, BILITOT, PROT, ALBUMIN in the last 168 hours. No results for input(s): LIPASE, AMYLASE in the last 168 hours. No results for input(s): AMMONIA in the last 168 hours. CBC:  Recent Labs Lab  06/27/15 0500 06/28/15 0544 06/29/15 0500 07/01/15 0340 07/03/15 0505  WBC 16.1* 14.8* 11.9* 10.8* 13.8*  HGB 9.4* 10.3* 10.2* 9.5* 9.3*  HCT 29.6* 32.9* 32.9* 29.7* 29.2*  MCV 99.7 99.1 99.4 98.0 96.7  PLT 278 320 327 314 367   Cardiac Enzymes: No results for input(s): CKTOTAL, CKMB, CKMBINDEX, TROPONINI in the last 168 hours. BNP: Invalid input(s): POCBNP CBG:  Recent Labs Lab 07/02/15 0725 07/02/15 1206 07/02/15 1655 07/02/15 2104 07/03/15 0730  GLUCAP 149* 144* 133* 161* 164*    Culture, blood (single)     Status: None   Collection Time: 06/25/15 12:00 PM  Result Value Ref Range Status   Specimen Description BLOOD CENTRAL LINE  Final   Special Requests BOTTLES DRAWN AEROBIC AND ANAEROBIC 5CC  Final   Culture  Setup Time   Final   Culture   Final   Report Status 06/29/2015 FINAL  Final   Organism ID, Bacteria ESCHERICHIA COLI  Final      Susceptibility   Escherichia coli - MIC*    AMPICILLIN >=32 RESISTANT Resistant     CEFAZOLIN <=4 SENSITIVE Sensitive     CEFEPIME <=1 SENSITIVE Sensitive     CEFTAZIDIME <=1 SENSITIVE Sensitive     CEFTRIAXONE <=1 SENSITIVE Sensitive     CIPROFLOXACIN <=0.25 SENSITIVE Sensitive     GENTAMICIN <=1 SENSITIVE Sensitive     IMIPENEM <=0.25 SENSITIVE Sensitive     TRIMETH/SULFA <=20 SENSITIVE Sensitive     AMPICILLIN/SULBACTAM >=32 RESISTANT Resistant     PIP/TAZO <=4 SENSITIVE Sensitive     * ESCHERICHIA COLI  Culture, Urine     Status: None   Collection Time: 06/25/15  3:35 PM  Result Value Ref Range Status   Specimen Description URINE, CLEAN CATCH  Final   Special Requests NONE  Final   Culture   Final   Report Status 06/27/2015 FINAL  Final   Organism ID, Bacteria ESCHERICHIA COLI  Final      Susceptibility   Escherichia coli - MIC*    AMPICILLIN >=32 RESISTANT Resistant     CEFAZOLIN <=4 SENSITIVE Sensitive     CEFTRIAXONE <=1 SENSITIVE Sensitive     CIPROFLOXACIN <=0.25 SENSITIVE Sensitive     GENTAMICIN <=1  SENSITIVE Sensitive     IMIPENEM <=0.25 SENSITIVE Sensitive     NITROFURANTOIN <=16 SENSITIVE Sensitive     TRIMETH/SULFA <=20 SENSITIVE Sensitive     AMPICILLIN/SULBACTAM >=32 RESISTANT Resistant     PIP/TAZO <=4 SENSITIVE Sensitive     * >=100,000 COLONIES/mL ESCHERICHIA COLI  MRSA PCR Screening     Status: None   Collection Time: 06/25/15  6:54 PM  Result Value Ref Range Status   MRSA by PCR NEGATIVE NEGATIVE Final     . aspirin  81 mg Oral Daily  . calcium carbonate  2 tablet Oral Daily  .  ceFAZolin (ANCEF) IV  2 g Intravenous 3  times per day  . diltiazem  180 mg Oral Daily  . enoxaparin (LOVENOX) injection  40 mg Subcutaneous Q24H  . feeding supplement (ENSURE ENLIVE)  237 mL Oral BID BM  . fluconazole  100 mg Oral Daily  . insulin aspart  0-5 Units Subcutaneous QHS  . insulin aspart  0-9 Units Subcutaneous TID WC  . insulin aspart  3 Units Subcutaneous TID WC  . levothyroxine  100 mcg Oral QAC breakfast  . loratadine  10 mg Oral Daily  . multivitamin with minerals  1 tablet Oral Daily  . nystatin  5 mL Oral QID  . pantoprazole  40 mg Oral Q0600

## 2015-07-03 NOTE — Progress Notes (Signed)
Date:  Sept. 15, 2016 U.R. performed for needs and level of care. Will continue to follow for Case Management needs.  Velva Harman, RN, BSN, [place]   [phone]

## 2015-07-03 NOTE — Progress Notes (Signed)
Clinical Social Work  Patient was discussed during Teaching laboratory technician (Dr. Reynaldo Minium) reports that if patient and family are interested in placement then CSW should get SNF to submit information for formal denial. CSW met with patient and dtr at bedside to provide bed offers. Patient chose [facility]. CSW spoke with SNF who reports they will send for authorization but cannot guarantee that it will be approved. CSW voiced understanding and agreeable to keep family updated once insurance has responded to SNF.  CSW will continue to follow.  [place], [place] [phone]

## 2015-07-04 LAB — CBC
HCT: 26.2 % — ABNORMAL LOW (ref 36.0–46.0)
Hemoglobin: 8.5 g/dL — ABNORMAL LOW (ref 12.0–15.0)
MCH: 30.5 pg (ref 26.0–34.0)
MCHC: 32.4 g/dL (ref 30.0–36.0)
MCV: 93.9 fL (ref 78.0–100.0)
Platelets: 311 10*3/uL (ref 150–400)
RBC: 2.79 MIL/uL — ABNORMAL LOW (ref 3.87–5.11)
RDW: 15.5 % (ref 11.5–15.5)
WBC: 9.3 10*3/uL (ref 4.0–10.5)

## 2015-07-04 LAB — GLUCOSE, CAPILLARY
Glucose-Capillary: 158 mg/dL — ABNORMAL HIGH (ref 65–99)
Glucose-Capillary: 153 mg/dL — ABNORMAL HIGH (ref 65–99)
Glucose-Capillary: 97 mg/dL (ref 65–99)
Glucose-Capillary: 173 mg/dL — ABNORMAL HIGH (ref 65–99)

## 2015-07-04 LAB — BASIC METABOLIC PANEL
Anion gap: 9 (ref 5–15)
BUN: 10 mg/dL (ref 6–20)
CO2: 24 mmol/L (ref 22–32)
Calcium: 8.1 mg/dL — ABNORMAL LOW (ref 8.9–10.3)
Chloride: 104 mmol/L (ref 101–111)
Creatinine, Ser: 1.14 mg/dL — ABNORMAL HIGH (ref 0.44–1.00)
GFR calc Af Amer: 54 mL/min — ABNORMAL LOW (ref >60)
GFR calc non Af Amer: 47 mL/min — ABNORMAL LOW (ref >60)
Glucose, Bld: 153 mg/dL — ABNORMAL HIGH (ref 65–99)
Potassium: 3 mmol/L — ABNORMAL LOW (ref 3.5–5.1)
Sodium: 137 mmol/L (ref 135–145)

## 2015-07-04 MED ORDER — DILTIAZEM HCL ER COATED BEADS 180 MG PO CP24: 180.0000 mg | ORAL_CAPSULE | Freq: Every day | ORAL | Status: DC

## 2015-07-04 MED ORDER — PANTOPRAZOLE SODIUM 40 MG PO TBEC: 40.0000 mg | DELAYED_RELEASE_TABLET | Freq: Every day | ORAL | Status: DC

## 2015-07-04 MED ORDER — ENSURE ENLIVE PO LIQD: 237.0000 mL | Freq: Two times a day (BID) | ORAL | Status: DC

## 2015-07-04 MED ORDER — CIPROFLOXACIN HCL 500 MG PO TABS: 500.0000 mg | ORAL_TABLET | Freq: Two times a day (BID) | ORAL | Status: DC

## 2015-07-04 MED ORDER — FLUCONAZOLE 100 MG PO TABS: 100.0000 mg | ORAL_TABLET | Freq: Every day | ORAL | Status: DC

## 2015-07-04 MED ORDER — OXYCODONE HCL 5 MG PO TABS: 5.0000 mg | ORAL_TABLET | ORAL | Status: DC | PRN

## 2015-07-04 MED ORDER — NYSTATIN 100000 UNIT/ML MT SUSP: 5.0000 mL | Freq: Four times a day (QID) | OROMUCOSAL | Status: DC

## 2015-07-04 MED ORDER — POTASSIUM CHLORIDE CRYS ER 20 MEQ PO TBCR
40.0000 meq | EXTENDED_RELEASE_TABLET | Freq: Once | ORAL | Status: AC
  Administered 2015-07-04: 40 meq via ORAL
  Filled 2015-07-04: qty 2

## 2015-07-04 MED ORDER — HEPARIN SOD (PORK) LOCK FLUSH 100 UNIT/ML IV SOLN
500.0000 [IU] | INTRAVENOUS | Status: AC | PRN
  Administered 2015-07-04: 500 [IU]

## 2015-07-04 NOTE — Progress Notes (Signed)
Physical Therapy Treatment Patient Details Name: Carrie [name] MRN: [id number] DOB: December 30, 1940 Today's Date: 07/04/2015    History of Present Illness Carrie [name] is an 74 y.o. female with a PMH of follicular lymphoma grade 3A, last chemo  cycle given 06/02/15, with subsequent follow-up PET scan showing near complete response to treatment. She was evaluated at the cancer center on 06/18/15 secondary to diarrhea, dizziness and mild nausea. She has not had much of an appetite since then. She was documented to have lost 5 pounds in a few days. She received IV fluids at the cancer center on 8/31, 9/1, 9/2 and 9/3 but despite this, she has continued to feel weak. She has had abdominal discomfort/dyspepsia per her family, but denies any specific symptoms. Upon initial evaluation in the ED 06/25/15,  the patient was noted to have findings concerning for a UTI. A 12 lead EKG showed atrial fibrillation with RVR (no known history of same).     PT Comments    Pt progressing slowly.  Increase self assist but limited activity tolerance.  Fatigues quickly.    Follow Up Recommendations  Supervision for mobility/OOB;SNF (Pt would benefit from ST Rehab at SNF to regain prior level of independence)     Equipment Recommendations  Rolling walker with 5" wheels    Recommendations for Other Services       Precautions / Restrictions Precautions Precautions: Fall Precaution Comments: monitor HR, urinary frequency and  incontinence Restrictions Weight Bearing Restrictions: No    Mobility  Bed Mobility Overal bed mobility: Needs Assistance         Sit to supine: Supervision   General bed mobility comments: incr time, supervision for safety  Transfers Overall transfer level: Needs assistance Equipment used: None Transfers: Sit to/from Stand Sit to Stand: Supervision         General transfer comment: good safety cognition  Ambulation/Gait Ambulation/Gait assistance:  Supervision Ambulation Distance (Feet): 285 Feet Assistive device: Rolling walker (2 wheeled) Gait Pattern/deviations: Step-through pattern;Decreased stride length Gait velocity: WFL   General Gait Details: good alternating gait.  Limited activity tolerance.  Requires rest breaks between activities.     Stairs            Wheelchair Mobility    Modified Rankin (Stroke Patients Only)       Balance                                    Cognition   Behavior During Therapy: WFL for tasks assessed/performed                        Exercises      General Comments        Pertinent Vitals/Pain Pain Assessment: Faces Faces Pain Scale: Hurts little more Pain Location: "my other knee" Pain Descriptors / Indicators: Constant Pain Intervention(s): Repositioned    Home Living                      Prior Function            PT Goals (current goals can now be found in the care plan section) Progress towards PT goals: Progressing toward goals    Frequency  Min 3X/week    PT Plan Current plan remains appropriate    Co-evaluation             End of Session  Equipment Utilized During Treatment: Gait belt Activity Tolerance: Patient tolerated treatment well Patient left: in bed;with call bell/phone within reach;with family/visitor present     Time: 1425-1440 PT Time Calculation (min) (ACUTE ONLY): 15 min  Charges:  $Gait Training: 8-22 mins                    G Codes:      Carrie [name]  PTA [facility]  Acute  Rehab Pager      [phone]

## 2015-07-04 NOTE — Progress Notes (Signed)
Health [facility]/family still awaiting decision from insurance concerning snf/if not approved the family is willing to pay privately/Holly Dory Horn notified /Rhonda davis,RN,BSN,CCM.

## 2015-07-04 NOTE — Discharge Instructions (Signed)
Bacteremia Bacteremia occurs when bacteria get in your blood. Normal blood does not usually have bacteria. Bacteremia is one way infections can spread from one part of the body to another. CAUSES   Causes may include anything that allows bacteria to get into the body. Examples are:  Catheters.  Intravenous (IV) access tubes.  Cuts or scrapes of the skin.  Temporary bacteremia may occur during dental procedures, while brushing your teeth, or during a bowel movement. This rarely causes any symptoms or medical problems.  Bacteria may also get in the bloodstream as a complication of a bacterial infection elsewhere. This includes infected wounds and bacterial infections of the:  Lungs (pneumonia).  Kidneys (pyelonephritis).  Intestines (enteritis, colitis).  Organs in the abdomen (appendicitis, cholecystitis, diverticulitis). SYMPTOMS  The body is usually able to clear small numbers of bacteria out of the blood quickly. Brief bacteremia usually does not cause problems.   Problems can occur if the bacteria start to grow in number or spread to other parts of the body. If the bacteria start growing, you may develop:  Chills.  Fever.  Nausea.  Vomiting.  Sweating.  Lightheadedness and low blood pressure.  Pain.  If bacteria start to grow in the linings around the brain, it is called meningitis. This can cause severe headaches, many other problems, and even death.  If bacteria start to grow in a joint, it causes arthritis with painful joints. If bacteria start to grow in a bone, it is called osteomyelitis.  Bacteria from the blood can also cause sores (abscesses) in many organs, such as the muscle, liver, spleen, lungs, brain, and kidneys. DIAGNOSIS   This condition is diagnosed by cultures of the blood.  Cultures may also be taken from other parts of the body that are thought to be causing the bacteremia. A small piece of tissue, fluid, or other product of the body is  sampled. The sample is then put on a growth plate to see if any bacteria grows.  Other lab tests may be done and the results may be abnormal. TREATMENT  Treatment requires a stay in the hospital. You will be given antibiotic medicine through an IV access tube. PREVENTION  People with an increased risk of developing bacteremia or complications may be given antibiotics before certain procedures. Examples are:  A person with a heart murmur or artificial heart valve, before having his or her teeth cleaned.  Before having a surgical or other invasive procedure.  Before having a bowel procedure. Document Released: 07/18/2006 Document Revised: 12/27/2011 Document Reviewed: 04/29/2011 [facility] Patient Information 2015 [place], [place]. This information is not intended to replace advice given to you by your health care provider. Make sure you discuss any questions you have with your health care provider. Ciprofloxacin tablets What is this medicine? CIPROFLOXACIN (sip roe FLOX a sin) is a quinolone antibiotic. It is used to treat certain kinds of bacterial infections. It will not work for colds, flu, or other viral infections. This medicine may be used for other purposes; ask your health care provider or pharmacist if you have questions. COMMON BRAND NAME(S): Cipro What should I tell my health care provider before I take this medicine? They need to know if you have any of these conditions: -bone problems -cerebral disease -joint problems -irregular heartbeat -kidney disease -liver disease -myasthenia gravis -seizure disorder -tendon problems -an unusual or allergic reaction to ciprofloxacin, other antibiotics or medicines, foods, dyes, or preservatives -pregnant or trying to get pregnant -breast-feeding How should I use  this medicine? Take this medicine by mouth with a glass of water. Follow the directions on the prescription label. Take your medicine at regular intervals. Do not take your  medicine more often than directed. Take all of your medicine as directed even if you think your are better. Do not skip doses or stop your medicine early. You can take this medicine with food or on an empty stomach. It can be taken with a meal that contains dairy or calcium, but do not take it alone with a dairy product, like milk or yogurt or calcium-fortified juice. A special MedGuide will be given to you by the pharmacist with each prescription and refill. Be sure to read this information carefully each time. Talk to your pediatrician regarding the use of this medicine in children. Special care may be needed. Overdosage: If you think you have taken too much of this medicine contact a poison control center or emergency room at once. NOTE: This medicine is only for you. Do not share this medicine with others. What if I miss a dose? If you miss a dose, take it as soon as you can. If it is almost time for your next dose, take only that dose. Do not take double or extra doses. What may interact with this medicine? Do not take this medicine with any of the following medications: -cisapride -droperidol -terfenadine -tizanidine This medicine may also interact with the following medications: -antacids -birth control pills -caffeine -cyclosporin -didanosine (ddI) buffered tablets or powder -medicines for diabetes -medicines for inflammation like ibuprofen, naproxen -methotrexate -multivitamins -omeprazole -phenytoin -probenecid -sucralfate -theophylline -warfarin This list may not describe all possible interactions. Give your health care provider a list of all the medicines, herbs, non-prescription drugs, or dietary supplements you use. Also tell them if you smoke, drink alcohol, or use illegal drugs. Some items may interact with your medicine. What should I watch for while using this medicine? Tell your doctor or health care professional if your symptoms do not improve. Do not treat  diarrhea with over the counter products. Contact your doctor if you have diarrhea that lasts more than 2 days or if it is severe and watery. You may get drowsy or dizzy. Do not drive, use machinery, or do anything that needs mental alertness until you know how this medicine affects you. Do not stand or sit up quickly, especially if you are an older patient. This reduces the risk of dizzy or fainting spells. This medicine can make you more sensitive to the sun. Keep out of the sun. If you cannot avoid being in the sun, wear protective clothing and use sunscreen. Do not use sun lamps or tanning beds/booths. Avoid antacids, aluminum, calcium, iron, magnesium, and zinc products for 6 hours before and 2 hours after taking a dose of this medicine. What side effects may I notice from receiving this medicine? Side effects that you should report to your doctor or health care professional as soon as possible: - allergic reactions like skin rash, itching or hives, swelling of the face, lips, or tongue - breathing problems - confusion, nightmares or hallucinations - feeling faint or lightheaded, falls - irregular heartbeat - joint, muscle or tendon pain or swelling - pain or trouble passing urine -persistent headache with or without blurred vision - redness, blistering, peeling or loosening of the skin, including inside the mouth - seizure - unusual pain, numbness, tingling, or weakness Side effects that usually do not require medical attention (report to your doctor or health care  professional if they continue or are bothersome): - diarrhea - nausea or stomach upset - white patches or sores in the mouth This list may not describe all possible side effects. Call your doctor for medical advice about side effects. You may report side effects to FDA at 1-800-FDA-1088. Where should I keep my medicine? Keep out of the reach of children. Store at room temperature below 30 degrees C (86 degrees F).  Keep container tightly closed. Throw away any unused medicine after the expiration date. NOTE: This sheet is a summary. It may not cover all possible information. If you have questions about this medicine, talk to your doctor, pharmacist, or health care provider.  2015, [employer]. (2013-05-10 16:10:46)

## 2015-07-04 NOTE — Progress Notes (Signed)
Clinical Social Work  CSW went to room to discuss DC plans. Family understanding that insurance has not given authorization but spoke with Armand[facility] re: private pay. Patient and family changed their mind after speaking with CM and reported they wanted to go home. Patient got called back to room and family reports they want to appeal discharge. Patient dressed and ambulating independently in room and reports she wants to go home. Dtr tells patient that they are staying until they hear from appeal. CSW updated SNF of possible weekend DC. Signed FL2 on chart if SNF desired.  [place], [place] [phone]

## 2015-07-04 NOTE — Care Management Important Message (Signed)
Important Message  Patient Details  Name: Carrie [name] MRN: [id number] Date of Birth: 06/10/1941   Medicare Important Message Given:  Yes-third notification given    Camillo Flaming 07/04/2015, 12:01 [place] Message  Patient Details  Name: Carrie [name] MRN: [id number] Date of Birth: 1941/03/28   Medicare Important Message Given:  Yes-third notification given    Camillo Flaming 07/04/2015, 12:00 PM

## 2015-07-04 NOTE — Discharge Summary (Addendum)
Physician Discharge Summary  Carrie [name] DZH:[id number] DOB: 07/14/1941 DOA: 06/25/2015  PCP: Darlyn Chamber, MD  Admit date: 06/25/2015 Discharge date: 07/04/2015  Recommendations for Outpatient Follow-up:  1. Continue ciprofloxacin for 5 days on discharge. 2. Please note that new medication is Cardizem 180 mg once a day for heart rate control and blood pressure control. 3. Continue fluconazole for yeast and oral thrush for 5 days on discharge. Continue nystatin swish and swallow for next 1 week on discharge.    Discharge Diagnoses:  Active Problems:   Bacteremia due to Escherichia coli   Sepsis due to Escherichia coli with acute renal failure   Hypothyroidism   Follicular lymphoma grade 3a   Mucositis due to chemotherapy   Dehydration   Anemia due to antineoplastic chemotherapy   Leukocytosis   Nausea without vomiting   Acute kidney injury   Thrush of mouth and esophagus   Atrial fibrillation with rapid ventricular response   Diabetes mellitus, likely due to steroids   Anemia of chronic disease   Troponin level elevated   Hypokalemia   Essential hypertension   Moderate protein-calorie malnutrition   Failure to thrive in adult   Hypernatremia    Discharge Condition: stable; pt wants to go home but her daughter very frustrated and thinks we have not done much to help out with placement to SNF. She feels that pt is not safe at home and demands SNF but is not agreeable to private pay. Despite pt wanting to go home she demands to have discharge appealed. In my conversation with patient's daughter she understood medically she is stable for discharge.   Diet recommendation: as tolerated   History of present illness:  74 y.o. female with a past medical history of follicular lymphoma grade 3A, under the care of Dr. Alvy Bimler, initially diagnosed 01/21/15 with initiation of R-CHOP chemotherapy 02/17/15 s/p 6 cycles (last cycle given 06/02/15). Patient had a follow-up PET scan which showed  near complete response to treatment. Patient was admitted 06/25/2015 with reports of weight loss, failure to thrive and weakness.  On admission, patient was found to have atrial fibrillation with RVR and has required Cardizem drip. She has been seen by cardiology in consultation. She is currently on oral Cardizem for rate control. During this hospital stay patient was also found to have Escherichia coli UTI as well as Escherichia coli bacteremia for which she is receiving Rocephin. Blood cultures repeated 07/02/2015 and so far show no growth.  Hospital Course:   Assessment/Plan:    Principal Problems: Sepsis secondary to Escherichia coli bacteremia with acute renal failure / Escherichia coli UTI / leukocytosis - Sepsis criteria met on the admission with presumed source of infection Escherichia coli UTI and Escherichia coli bacteremia. - Blood cultures on the admission grew Escherichia coli. Urine culture collected on the admission grew Escherichia coli. - Blood cultures repeated 07/02/2015 and so far show no growth. - We will continue patient on ciprofloxacin for 5 more days on discharge. This will complete a total of 14 day treatment for Escherichia coli bacteremia and UTI. - Please refer to) infected section on other antibodies received during this hospital stay. - Sepsis etiology resolved.  Active problems: Acute kidney injury - Likely secondary to sepsis and bacteremia. - Initial creatinine 2.29 but has trended down nicely to 1.14 this morning. - Creatinine likely improved with IV fluids and treatment of sepsis and bacteremia  Hypokalemia - Likely secondary to sepsis. Potassium was 3.0 this morning and it was supplemented prior  to discharge.  Hypernatremia - Likely pre-renal etiology secondary to dehydration. - Sodium now within normal limits. It'll likely has improved with IV fluids.   Follicular lymphoma grade 3a - Patient follows with Dr. Alvy Bimler on outpatient basis - As  noted above, PET scan showed complete response to treatment.  Mucositis due to chemotherapy / Thrush of mouth and esophagus - Continue Diflucan and nystatin swish and swallow. The patient instructed to continue Diflucan for 5 days on discharge and a nystatin swish and swallow for 7 days on discharge. - Patient able to tolerate solid food. No difficulty or pain on swallowing.  Anemia due to antineoplastic chemotherapy / Anemia of chronic disease - Anemia likely related to history of malignancy, sequela of chemotherapy - Hemoglobin stable at 8.5. No current indications for transfusion.  Hypothyroidism - TSH within normal limit - Continue Synthroid  Nausea without vomiting - Likely secondary to acute infection - Resolved   Atrial fibrillation with rapid ventricular response - CHADS2-VASC score is 2 - Patient is not a candidate for anticoagulation secondary to risk of falls. Per cardiology, risk of anticoagulation outweighs the benefit. - Patient will continue Cardizem 180 mg daily on discharge. - Patient had 2-D echo on this admission which showed normal ejection fraction.  Steroid-induced hyperglycemia - A1c is 6.8 indicating diabetes however this is likely secondary to steroids. - Patient is not on steroids at this time. Most likely her CBGs will continue to be better on discharge. We will not send patient on insulin at this time I have advised her about diet controlled diabetes at this time. She preferred this management.  Essential hypertension - Continue Cardizem 180 mg daily    Troponin level elevated - Likely demand ischemia from acute renal failure and sepsis - Troponin level on 06/26/2015 - 0.5.  - Continue aspirin  Moderate protein-calorie malnutrition / Failure to thrive in adult / Dehydration - In the context of chronic illness. - Continue ensure supplements   Neuropathy due to chemotherapeutic drug - Stable.   DVT Prophylaxis  - Lovenox  subcutaneous   Code Status: Full.  Family Communication: plan of care discussed with the patient and her daughter at the bedside  IV access:  Port-A-Cath  Procedures and diagnostic studies:   Dg Knee 1-2 Views Left 07/01/2015 2 compartment osteoarthritis with small suprapatellar joint effusion. Heterogeneous lucency within the proximal tibia. Although this could be postsurgical, given the history of patellar tendon transplant, lymphomatous involvement cannot be excluded. Consider full four view knee series versus further evaluation with pre and post-contrast MRI.   Medical Consultants:  Oncology: [name], MD Cardiology: Sanda Klein, MD  Other Consultants:  Physical therapy  IAnti-Infectives:   Zosyn 06/25/15 --> 06/29/15 Rocephin 06/25/15 --> 06/26/15 Ancef 06/29/15 --> 07/04/2015   Signed:  Leisa Lenz, MD  [facility] 07/04/2015, 10:55 AM  Pager #: [phone]  Time spent in minutes: more than 30 minutes  Discharge Exam: Filed Vitals:   07/04/15 1010  BP: 129/92  Pulse: 90  Temp: 98.1 F (36.7 C)  Resp: 18   Filed Vitals:   07/03/15 1357 07/03/15 2055 07/04/15 0510 07/04/15 1010  BP: 137/71 124/60 131/72 129/92  Pulse: 96 92 87 90  Temp: 98.1 F (36.7 C) 98.8 F (37.1 C) 98.2 F (36.8 C) 98.1 F (36.7 C)  TempSrc:  Oral Oral Oral  Resp:  _0 Height:      Weight:      SpO2: 95% 94% 94% 100%  General: Pt is alert, follows commands appropriately, not in acute distress Cardiovascular: Regular rate and rhythm, S1/S2 + Respiratory: Clear to auscultation bilaterally, no wheezing, no crackles, no rhonchi Abdominal: Soft, non tender, non distended, bowel sounds +, no guarding Extremities: no edema, no cyanosis, pulses palpable bilaterally DP and PT Neuro: Grossly nonfocal  Discharge Instructions  Discharge Instructions    Call MD for:  difficulty breathing, headache or visual disturbances    Complete by:  As directed       Call MD for:  persistant nausea and vomiting    Complete by:  As directed      Call MD for:  severe uncontrolled pain    Complete by:  As directed      Diet - low sodium heart healthy    Complete by:  As directed      Discharge instructions    Complete by:  As directed   1. Continue ciprofloxacin for 5 days on discharge. 2. Please note that new medication is Cardizem 180 mg once a day for heart rate control and blood pressure control. 3. Continue fluconazole for yeast and oral thrush for 5 days on discharge. Continue nystatin swish and swallow for next 1 week on discharge.     Increase activity slowly    Complete by:  As directed             Medication List    STOP taking these medications        predniSONE 20 MG tablet  Commonly known as:  DELTASONE      TAKE these medications        acetaminophen 325 MG tablet  Commonly known as:  TYLENOL  Take 650 mg by mouth every 6 (six) hours as needed.     AIRBORNE GUMMIES PO  Take by mouth.     aspirin 81 MG tablet  Take 81 mg by mouth daily.     calcium carbonate 500 MG chewable tablet  Commonly known as:  TUMS  Chew 2 tablets (400 mg of elemental calcium total) by mouth daily.     ciprofloxacin 500 MG tablet  Commonly known as:  CIPRO  Take 1 tablet (500 mg total) by mouth 2 (two) times daily.     diltiazem 180 MG 24 hr capsule  Commonly known as:  CARDIZEM CD  Take 1 capsule (180 mg total) by mouth daily.     feeding supplement (ENSURE ENLIVE) Liqd  Take 237 mLs by mouth 2 (two) times daily between meals.     fluconazole 100 MG tablet  Commonly known as:  DIFLUCAN  Take 1 tablet (100 mg total) by mouth daily.     levothyroxine 100 MCG tablet  Commonly known as:  SYNTHROID, LEVOTHROID  Take 100 mcg by mouth daily.     lidocaine-prilocaine cream  Commonly known as:  EMLA  Apply 1 application topically as needed. Apply to port a cath site one hour prior to needle stick     loratadine 10 MG tablet  Commonly  known as:  CLARITIN  Take 10 mg by mouth daily.     nystatin 100000 UNIT/ML suspension  Commonly known as:  MYCOSTATIN  Take 5 mLs (500,000 Units total) by mouth 4 (four) times daily.     ondansetron 8 MG disintegrating tablet  Commonly known as:  ZOFRAN ODT  Take 1 tablet (8 mg total) by mouth every 8 (eight) hours as needed for nausea or vomiting.     oxyCODONE 5 MG  immediate release tablet  Commonly known as:  Oxy IR/ROXICODONE  Take 1 tablet (5 mg total) by mouth every 4 (four) hours as needed for moderate pain.     pantoprazole 40 MG tablet  Commonly known as:  PROTONIX  Take 1 tablet (40 mg total) by mouth daily at 6 (six) AM.     PRESCRIPTION MEDICATION  Chemo at [facility].     prochlorperazine 10 MG tablet  Commonly known as:  COMPAZINE  Take 1 tablet by mouth every 6 (six) hours as needed. Nausea/vomiting.           Follow-up Information    Follow up with [facility] On 07/04/2015.   Why:  2:00pm. To get 30 day event monitor   Contact information:   [address] [place] [address] [phone]      Follow up with [name], PA-C On 07/21/2015.   Specialties:  Physician Assistant, Radiology, Interventional Cardiology   Why:  10:10am.   Contact information:   [address] [place] [address] [phone]       Follow up with Darlyn Chamber, MD. Schedule an appointment as soon as possible for a visit in 1 week.   Specialty:  Obstetrics and Gynecology   Why:  Follow up appt after recent hospitalization   Contact information:   [place] [address] [phone]        The results of significant diagnostics from this hospitalization (including imaging, microbiology, ancillary and laboratory) are listed below for reference.    Significant Diagnostic Studies: Dg Knee 1-2 Views Left  Jul 25, 2015   CLINICAL DATA:  Left knee pain for 2 days without injury. History of follicular  lymphoma. History of patellar tendon transplant bilaterally.  EXAM: LEFT KNEE - 1-2 VIEW  COMPARISON:  None.  FINDINGS: AP and lateral views. Moderate patellofemoral and mild medial compartment joint space narrowing with subchondral sclerosis. Small suprapatellar joint effusion. Osseous irregularity involving the proximal tibia with ill-defined lucency. Most apparent on the frontal radiograph.  IMPRESSION: 2 compartment osteoarthritis with small suprapatellar joint effusion.  Heterogeneous lucency within the proximal tibia. Although this could be postsurgical, given the history of patellar tendon transplant, lymphomatous involvement cannot be excluded. Consider full four view knee series versus further evaluation with pre and post-contrast MRI.  These results will be called to the ordering clinician or representative by the Radiologist Assistant, and communication documented in the PACS or zVision Dashboard.   Electronically Signed   By: Abigail Miyamoto M.D.   On: Jul 25, 2015 20:05   Dg Chest Port 1 View  06/25/2015   CLINICAL DATA:  Patient diagnosed with non-Hodgkin's lymphoma April, 2016. Weakness since 06/14/2015.  EXAM: PORTABLE CHEST - 1 VIEW  COMPARISON:  PET CT scan 04/18/2015. Single view of the chest 02/13/2015.  FINDINGS: Right IJ Port-A-Cath remains in place, unchanged. The lungs are clear. Heart size mildly enlarged. No pneumothorax or pleural effusion. No focal bony abnormality.  IMPRESSION: No acute disease.   Electronically Signed   By: Inge Rise M.D.   On: 06/25/2015 13:51    Microbiology: Recent Results (from the past 240 hour(s))  Culture, blood (single)     Status: None   Collection Time: 06/25/15 12:00 PM  Result Value Ref Range Status   Specimen Description BLOOD CENTRAL LINE  Final   Special Requests BOTTLES DRAWN AEROBIC AND ANAEROBIC 5CC  Final   Culture  Setup Time   Final  GRAM NEGATIVE RODS IN BOTH AEROBIC AND ANAEROBIC BOTTLES CRITICAL RESULT CALLED TO, READ BACK BY  AND VERIFIED WITH: S [email or web address]  06/26/15 MKELLY    Culture   Final    ESCHERICHIA COLI Performed at [facility]    Report Status 06/29/2015 FINAL  Final   Organism ID, Bacteria ESCHERICHIA COLI  Final      Susceptibility   Escherichia coli - MIC*    AMPICILLIN >=32 RESISTANT Resistant     CEFAZOLIN <=4 SENSITIVE Sensitive     CEFEPIME <=1 SENSITIVE Sensitive     CEFTAZIDIME <=1 SENSITIVE Sensitive     CEFTRIAXONE <=1 SENSITIVE Sensitive     CIPROFLOXACIN <=0.25 SENSITIVE Sensitive     GENTAMICIN <=1 SENSITIVE Sensitive     IMIPENEM <=0.25 SENSITIVE Sensitive     TRIMETH/SULFA <=20 SENSITIVE Sensitive     AMPICILLIN/SULBACTAM >=32 RESISTANT Resistant     PIP/TAZO <=4 SENSITIVE Sensitive     * ESCHERICHIA COLI  Culture, Urine     Status: None   Collection Time: 06/25/15  3:35 PM  Result Value Ref Range Status   Specimen Description URINE, CLEAN CATCH  Final   Special Requests NONE  Final   Culture   Final    >=100,000 COLONIES/mL ESCHERICHIA COLI Performed at [facility]    Report Status 06/27/2015 FINAL  Final   Organism ID, Bacteria ESCHERICHIA COLI  Final      Susceptibility   Escherichia coli - MIC*    AMPICILLIN >=32 RESISTANT Resistant     CEFAZOLIN <=4 SENSITIVE Sensitive     CEFTRIAXONE <=1 SENSITIVE Sensitive     CIPROFLOXACIN <=0.25 SENSITIVE Sensitive     GENTAMICIN <=1 SENSITIVE Sensitive     IMIPENEM <=0.25 SENSITIVE Sensitive     NITROFURANTOIN <=16 SENSITIVE Sensitive     TRIMETH/SULFA <=20 SENSITIVE Sensitive     AMPICILLIN/SULBACTAM >=32 RESISTANT Resistant     PIP/TAZO <=4 SENSITIVE Sensitive     * >=100,000 COLONIES/mL ESCHERICHIA COLI  MRSA PCR Screening     Status: None   Collection Time: 06/25/15  6:54 PM  Result Value Ref Range Status   MRSA by PCR NEGATIVE NEGATIVE Final    Comment:        The GeneXpert MRSA Assay (FDA approved for NASAL specimens only), is one component of a comprehensive MRSA colonization surveillance  program. It is not intended to diagnose MRSA infection nor to guide or monitor treatment for MRSA infections.   Culture, blood (routine x 2)     Status: None (Preliminary result)   Collection Time: 07/02/15 12:26 PM  Result Value Ref Range Status   Specimen Description BLOOD LEFT ARM  Final   Special Requests BOTTLES DRAWN AEROBIC AND ANAEROBIC 5CC  Final   Culture   Final    NO GROWTH 1 DAY Performed at [facility]    Report Status PENDING  Incomplete  Culture, blood (routine x 2)     Status: None (Preliminary result)   Collection Time: 07/02/15 12:30 PM  Result Value Ref Range Status   Specimen Description BLOOD RIGHT ARM  Final   Special Requests BOTTLES DRAWN AEROBIC AND ANAEROBIC 10CC  Final   Culture   Final    NO GROWTH 1 DAY Performed at [facility]    Report Status PENDING  Incomplete     Labs: Basic Metabolic Panel:  Recent Labs Lab 06/28/15 0544 06/29/15 0500 07/01/15 0340 07/02/15 0501 07/03/15 0505 07/04/15 0537  NA 149* 145 143  --  138 137  K 4.2 4.2 3.4*  --  3.7 3.0*  CL 117* 112* 109  --  104 104  CO2 _0 --  25 24  GLUCOSE 230* 219* 215*  --  169* 153*  BUN 25* 20 13  --  10 10  CREATININE 1.67* 1.43* 1.22* 1.34* 1.24* 1.14*  CALCIUM 7.6* 7.9* 8.2*  --  8.4* 8.1*   Liver Function Tests: No results for input(s): AST, ALT, ALKPHOS, BILITOT, PROT, ALBUMIN in the last 168 hours. No results for input(s): LIPASE, AMYLASE in the last 168 hours. No results for input(s): AMMONIA in the last 168 hours. CBC:  Recent Labs Lab 06/28/15 0544 06/29/15 0500 07/01/15 0340 07/03/15 0505 07/04/15 0537  WBC 14.8* 11.9* 10.8* 13.8* 9.3  HGB 10.3* 10.2* 9.5* 9.3* 8.5*  HCT 32.9* 32.9* 29.7* 29.2* 26.2*  MCV 99.1 99.4 98.0 96.7 93.9  PLT 320 327 314 367 311   Cardiac Enzymes: No results for input(s): CKTOTAL, CKMB, CKMBINDEX, TROPONINI in the last 168 hours. BNP: BNP (last 3 results) No results for input(s): BNP in the last 8760  hours.  ProBNP (last 3 results) No results for input(s): PROBNP in the last 8760 hours.  CBG:  Recent Labs Lab 07/03/15 0730 07/03/15 1212 07/03/15 1710 07/03/15 2053 07/04/15 0738  GLUCAP 164* 129* 132* 164* 158*

## 2015-07-05 LAB — GLUCOSE, CAPILLARY
Glucose-Capillary: 149 mg/dL — ABNORMAL HIGH (ref 65–99)
Glucose-Capillary: 108 mg/dL — ABNORMAL HIGH (ref 65–99)
Glucose-Capillary: 123 mg/dL — ABNORMAL HIGH (ref 65–99)
Glucose-Capillary: 144 mg/dL — ABNORMAL HIGH (ref 65–99)

## 2015-07-05 NOTE — Progress Notes (Addendum)
Pt discharged 9/17 but family appealed the discharge. No changes in management. Discharge summary done 07/04/15. Please refer to that d/c summary. Stable for discharge.  Leisa Lenz [facility] [phone]

## 2015-07-05 NOTE — Progress Notes (Addendum)
07/05/2015 1715 NCM checked [employer] website and appeal is in clinical review status. Waiting final decision on appeal. Jonnie Finner RN CCM Case Mgmt phone [phone]  Late entry 07/05/2015 1450 CSW and NCM met with pt and family in room. NCM spoke to pt and gave permission to speak to son, Roshelle Traub # [phone] and dtr, Cindee Lame [phone] . Dtr states they did follow up with [facility] to discuss status for SNF placement on 07/04/2015, once they received pt was scheduled for dc home. The auth was pending approval and they contacted [employer] on 07/04/2015 to start Medicare appeal process, to appeal dc. Family states they did not receive notification that [facility] did not accept [employer] Medicare until Thursday, 07/03/2015. NCM did follow up with CSW Asst Director, Zack for concerns related to SNF placement. NCM explained to family  waiting final decision from [place] in regards to appeal of dc. They still want SNF placement post dc and waiting final decision from appeal and [employer] Medicare pending auth for SNF. Jonnie Finner RN CCM Case Mgmt phone [phone]  Late entry 07/05/2015  1100 NCM follow up with Judithann Graves is case # [id number] and additional clinical information requested. Faxed progress notes, dc summary, 2nd/3rd IM notifications Jonnie Finner RN CCM Case Mgmt phone [phone] CSW/CM notes

## 2015-07-06 LAB — GLUCOSE, CAPILLARY
Glucose-Capillary: 157 mg/dL — ABNORMAL HIGH (ref 65–99)
Glucose-Capillary: 146 mg/dL — ABNORMAL HIGH (ref 65–99)
Glucose-Capillary: 154 mg/dL — ABNORMAL HIGH (ref 65–99)

## 2015-07-06 MED ORDER — HEPARIN SOD (PORK) LOCK FLUSH 100 UNIT/ML IV SOLN
500.0000 [IU] | INTRAVENOUS | Status: AC | PRN
  Administered 2015-07-06: 500 [IU]

## 2015-07-06 NOTE — Progress Notes (Signed)
Discharge summary faxed to [facility]. Confirmation received that fax was successfully sent. Jonnie Finner RN CCM Case Mgmt phone [phone]

## 2015-07-06 NOTE — Progress Notes (Signed)
D/C already prepared for 9/16. No changes in medications.  Leisa Lenz [facility] [phone]

## 2015-07-06 NOTE — Clinical Social Work Placement (Signed)
   CLINICAL SOCIAL WORK PLACEMENT  NOTE  Date:  07/06/2015  Patient Details  Name: Carrie [name] MRN: [id number] Date of Birth: Feb 22, 1941  Clinical Social Work is seeking post-discharge placement for this patient at the [place] level of care (*CSW will initial, date and re-position this form in  chart as items are completed):  Yes   Patient/family provided with [place] Work Department's list of facilities offering this level of care within the geographic area requested by the patient (or if unable, by the patient's family).  Yes   Patient/family informed of their freedom to choose among providers that offer the needed level of care, that participate in Medicare, Medicaid or managed care program needed by the patient, have an available bed and are willing to accept the patient.  Yes   Patient/family informed of [facility]'s ownership interest in [facility] and [facility], as well as of the fact that they are under no obligation to receive care at these facilities.  PASRR submitted to EDS on 06/28/15     PASRR number received on 06/28/15     Existing PASRR number confirmed on       FL2 transmitted to all facilities in geographic area requested by pt/family on 06/28/15     FL2 transmitted to all facilities within larger geographic area on       Patient informed that his/her managed care company has contracts with or will negotiate with certain facilities, including the following:            Patient/family informed of bed offers received.  Patient chooses bed at     [facility]  Physician recommends and patient chooses bed at      [facility]  [facility] on  .Septmeber 18, 2016  Patient to be transferred to facility by   daughter and son in law    Patient family notified on  July 06, 2015 of transfer.  Name of family member notified:     Gernell/daughter   PHYSICIAN        Additional Comment:    _______________________________________________ Carlean Jews, LCSW 07/06/2015, 8:12 PM

## 2015-07-06 NOTE — Progress Notes (Addendum)
07/06/2015 1330 NCM spoke to son, Tayelor Osborne and updated him on Medicare Appeal was denied. Jonnie Finner RN CCM Case Mgmt phone [phone]  07/06/2015 12:19 pm Contacted [employer] for final decision. Appeal was denied. Beneficiary is liable, liability will begin 07/07/2015 at 12 noon. NCM spoke to pt in room, requested NCM come back to speak to dtr. NCM left voice message on cell for Felicie Kocher. Jonnie Finner RN CCM Case Mgmt phone [phone]

## 2015-07-06 NOTE — Care Management (Signed)
NCM spoke to Dr. Charlies Silvers. Order given that Cipro po can be started tomorrow 07/07/2015 at SNF, can skip 07/06/2015 10 pm Cipro dose. Family made aware. Jonnie Finner RN CCM Case Mgmt phone [phone]

## 2015-07-06 NOTE — Clinical Social Work Note (Addendum)
CSW received notice from case manager that discharge appeal denied.  CSW met with pt and her family to discuss discharge plans.  Family stated that per appeals she will not be charged until noon tomorrow so pt and family want to plan for [facility] discharge in the morning.  They are hoping that they might hear from insurance in am as well and will not need to pay out of pocket.    CSW spoke with tammy at [facility] to see if pt could have bed today with private pay CSW also asking GL if PT will be covered by part B medicare and Tammy (GL) stated she will have to check on this and get back to CSW  CSW provided information to pt and family and agreed to follow up today with any additional information obtained from GL.   Addendum 7:00pm  Tammy from [facility] called to say that pt can have a bed this evening as long as she gets all of her medications and if pain pills are needed a script needs to come with pt.  Case manager RN coordinating with RN on the floor to assist with discharge medications.   Dede Query, LCSW [place] Worker - Weekend Coverage cell #: [phone]

## 2015-07-06 NOTE — Clinical Social Work Note (Addendum)
CSW received call from Surveyor, quantity stating that since appeal was denied pt should be given choice of [facility] bed since we were still waiting for [facility] to call back and then call him back with what was said by pt and family  CSW met with pt and family to let them know that [facility] was still working on things but that [facility] was offering a bed offer.  Pt and family declined.   CSW called asst director back to let him know that pt declined.  He stated that now case manangement supervisor would make decision about what to do next.    Case manager met with CSW and said that her supervisor stated that pt needed to be told to go home and she needed CSW with her to give the message to pt and family  CSW called and spoke with asst director to let him know what case manager's supervisor stated and that she was going with case manager to support her in telling the family they had to go home  CSW and case manager walked into pt room and pt's daughter was on the phone with [facility] tammy discussing placement and insurance  Per pt's daughter [facility] wanted to speak with case manager so phone number was provided while in the room and case manager called and spoke with [facility] while in the room with CSW and family  [facility] stated that they could accommodate pt tonight as long as medications were provided before discharge  CSW did advise the pt and family that they could go home tonight instead of going to [facility] but the family said no and preferred to go to [facility]  CSW prepared paperwork and case manager coordinated medications and  Spoke with MD regarding discharge  CSW faxed discharge summary and provided packet to pt and family   .Dede Query, LCSW [facility] Clinical Social Worker - Weekend Coverage cell #: [phone]

## 2015-07-06 NOTE — Progress Notes (Signed)
Called report to [name], LPN at [facility].  Patient left via w/c with family and personal belongings. Virginia Rochester, RN

## 2015-07-06 NOTE — Clinical Social Work Note (Signed)
CSW is continuing to try and fax discharge summary to [facility] via fax number provided but getting busy signal CSW let [facility] know that fax is ringing busy  .Dede Query, LCSW [facility] Clinical Social Worker - Weekend Coverage cell #: [phone]

## 2015-07-06 NOTE — Clinical Social Work Note (Signed)
CSW spoke pt, daughter and her son in law at bedside to discuss discharge plans  CSW encouraged pt and family to discuss needs for discharge  CSW explained SNF/insurance process as it was explained to [place] per [facility].  Authorization was started on Thursday afternoon Sept 15th and came back stating it needed further review.  [facility] believes that this means insurance will deny SNF so they discussed private pay with pt's daughter on Friday.  Pt's daughter said she felt "pressured" to make a decision on Friday since authorization had not come back. They also were told pt needed to commit to 30 days and did not feel pt needed that amount of time.  Family stated they were also concerned with pt's health issues and felt that pt was not ready for discharge so they appealed the discharge. Family also frustrated that hospital did not provide information that SNF of their choice ([facility]) was not in insurance network until Thursday when SNF was "always their plan".   CSW explained to family that per chart notes hospital weekday CSW's had met with pt earlier in the week when family was not at bedside and had discussed SNF.  CSW explained that notes reflect pt told CSW that since insurance would not pay she would go home with [facility] so this was set up although authorization paperwork had not been started.  Family became aware that insurance had not  been contacted for authorization until they called  [employer] Thursday morning and they had no paperwork for the pt.  Per Tammy at [facility] the SNF process/authorization was not started until Thursday afternoon and required additional review so by Friday when family received discharge they did not have word back from insurance yet and felt "pressured" to make a decision. Family stated that paying out of pocket not a problem if insurance denied but was told they needed to commit to 30 days and felt that pt did not need that long of a stay.   CSW prompted pt  and family to explore discharge plans and to have a plan for discharge while waiting for their appeal. Family stated an interest in [place] pay if pt's authorization is denied so CSW spoke with [facility] to find out if pt can pay for 7 days instead of 30.  Per Tammy at [facility]  pt can stay a week and only pay for 7 days up front as long as they knew if pt stayed longer they would have to "write a check".[facility] states approximately $1720 for one week but will give exact dollar amount when pt decides to come to facility. Family was provided with this information.  Family stated that they were grateful for the hospital care that pt got but feels that "hospital dropped the ball" with regards to communicating SNF and insurance in timely manner.    CSW will follow up with family Sunday Sept. 18th and case manager regarding discharge appeal  .Dede Query, [place] Social Worker - Weekend Coverage cell #: [phone]

## 2015-07-06 NOTE — Clinical Social Work Note (Signed)
CSW had bed at [facility] for pt who declined.  Still wanting to wait for bed at [facility].  Marland KitchenDede Query, LCSW [facility] Clinical Social Worker - Weekend Coverage cell #: [phone]

## 2015-07-06 NOTE — Care Management (Addendum)
NCM spoke received call from Carrie [name], Carrie [name] and pt offered SNF-[facility]. Family does not want [facility] placement. CSW and NCM spoke to pt's family and they are willing to private pay at facility of Carrie [name] and this was communicated on 07/04/2015 to [place] and SNF. Family did speak to [facility] on 07/04/2015 at 4:45 pm, and auth for SNF was pending. Family filed appeal with Medicare, and appeal was denied on 07/06/2015. Dtr-in-law states they received call from Medicare about denial at 1:40 pm. Pt and son, Carrie [name] agreeable to dc to [facility] if they can accept pt this evening. NCM spoke to Tammie, [facility] rep and they can accept pt this evening but unable to administer evening dose of scheduled medications due to their pharmacy is closed. Spoke to Ryan, and delay in accepting pt was business office is closed on weekend and issue with processing private pay patient and SNF auth pending with [employer]. Pt will have to miss evening dose of Cipro if dc to SNF. Explained to pt that NCM can arrange dc to home with [facility] RN and SW. And family can follow up with SNF on 07/07/2015 for admission. Son, Carrie [name] prefers dc to [facility]. Will notify attending MD, Dr. Charlies Silvers for instructions on Cipro. Will help CSW facilitate dc to SNF this evening. Tammie, [facility] rep has discussed payment and options with family. NCM spoke to Son, Carrie [name] and dtr-in-law and they are in agreement with dc to [facility] tonight. NCM spoke to pt and family and sincere apology given for delays in dc to SNF.  Carrie Finner RN CCM Case Mgmt phone [phone]  07/06/2015 1800 NCM did dicuss case with Asst Director, Nathaniel Man, CSW Asst Director, Deveron Furlong RN and Market researcher, Dr. Burnard Bunting. Planned is for dc to SNF or home 07/06/2015. Family prefer to dc on 07/07/2015. Approval for dc on 07/07/2015 was denied by director. NCM did make pt and family aware. Carrie Finner RN CCM Case Mgmt  phone [phone]

## 2015-07-07 ENCOUNTER — Non-Acute Institutional Stay (SKILLED_NURSING_FACILITY): Payer: Medicare HMO | Admitting: Internal Medicine

## 2015-07-07 DIAGNOSIS — K1231 Oral mucositis (ulcerative) due to antineoplastic therapy: Secondary | ICD-10-CM | POA: Diagnosis not present

## 2015-07-07 DIAGNOSIS — C8290 Follicular lymphoma, unspecified, unspecified site: Secondary | ICD-10-CM

## 2015-07-07 DIAGNOSIS — I480 Paroxysmal atrial fibrillation: Secondary | ICD-10-CM

## 2015-07-07 DIAGNOSIS — E1165 Type 2 diabetes mellitus with hyperglycemia: Secondary | ICD-10-CM

## 2015-07-07 DIAGNOSIS — E039 Hypothyroidism, unspecified: Secondary | ICD-10-CM | POA: Diagnosis not present

## 2015-07-07 DIAGNOSIS — B370 Candidal stomatitis: Secondary | ICD-10-CM | POA: Diagnosis not present

## 2015-07-07 DIAGNOSIS — E440 Moderate protein-calorie malnutrition: Secondary | ICD-10-CM | POA: Diagnosis not present

## 2015-07-07 DIAGNOSIS — C8280 Other types of follicular lymphoma, unspecified site: Secondary | ICD-10-CM

## 2015-07-07 DIAGNOSIS — D6481 Anemia due to antineoplastic chemotherapy: Secondary | ICD-10-CM | POA: Diagnosis not present

## 2015-07-07 DIAGNOSIS — R7881 Bacteremia: Secondary | ICD-10-CM | POA: Diagnosis not present

## 2015-07-07 DIAGNOSIS — B3781 Candidal esophagitis: Secondary | ICD-10-CM

## 2015-07-07 DIAGNOSIS — T451X5A Adverse effect of antineoplastic and immunosuppressive drugs, initial encounter: Secondary | ICD-10-CM

## 2015-07-07 DIAGNOSIS — B9620 Unspecified Escherichia coli [E. coli] as the cause of diseases classified elsewhere: Secondary | ICD-10-CM

## 2015-07-07 LAB — CULTURE, BLOOD (ROUTINE X 2)
Culture: NO GROWTH
Culture: NO GROWTH

## 2015-07-07 NOTE — Progress Notes (Signed)
Call received from [facility], Verne Carrow. SNF coverage was denied. Attending can request a Peer to Peer with [facility] Director within 48 hours. # [phone]. NCM notified attending, Dr Charlies Silvers. P2P not requested at this time. Jonnie Finner RN CCM Case Mgmt phone [phone]

## 2015-07-11 ENCOUNTER — Ambulatory Visit (HOSPITAL_COMMUNITY)
Admission: RE | Admit: 2015-07-11 | Discharge: 2015-07-11 | Disposition: A | Discharge status: Home / Self Care | Payer: Medicare HMO | Source: Ambulatory Visit | Attending: Hematology and Oncology | Admitting: Hematology and Oncology

## 2015-07-11 DIAGNOSIS — R918 Other nonspecific abnormal finding of lung field: Secondary | ICD-10-CM | POA: Diagnosis not present

## 2015-07-11 DIAGNOSIS — C8230 Follicular lymphoma grade IIIa, unspecified site: Secondary | ICD-10-CM | POA: Insufficient documentation

## 2015-07-11 LAB — GLUCOSE, CAPILLARY: Glucose-Capillary: 145 mg/dL — ABNORMAL HIGH (ref 65–99)

## 2015-07-11 MED ORDER — FLUDEOXYGLUCOSE F - 18 (FDG) INJECTION
6.6000 | Freq: Once | INTRAVENOUS | Status: DC | PRN
  Administered 2015-07-11: 6.6 via INTRAVENOUS
  Filled 2015-07-11: qty 6.6

## 2015-07-14 ENCOUNTER — Ambulatory Visit: Payer: Medicare Other

## 2015-07-14 ENCOUNTER — Other Ambulatory Visit (HOSPITAL_BASED_OUTPATIENT_CLINIC_OR_DEPARTMENT_OTHER): Payer: Medicare Other

## 2015-07-14 ENCOUNTER — Ambulatory Visit (HOSPITAL_BASED_OUTPATIENT_CLINIC_OR_DEPARTMENT_OTHER): Payer: Medicare HMO

## 2015-07-14 ENCOUNTER — Ambulatory Visit (HOSPITAL_BASED_OUTPATIENT_CLINIC_OR_DEPARTMENT_OTHER): Payer: Medicare HMO | Admitting: Hematology and Oncology

## 2015-07-14 ENCOUNTER — Encounter: Payer: Self-pay | Admitting: *Deleted

## 2015-07-14 ENCOUNTER — Telehealth: Payer: Self-pay | Admitting: Hematology and Oncology

## 2015-07-14 ENCOUNTER — Other Ambulatory Visit: Payer: Self-pay | Admitting: Hematology and Oncology

## 2015-07-14 ENCOUNTER — Other Ambulatory Visit: Payer: Self-pay | Admitting: *Deleted

## 2015-07-14 VITALS — BP 141/75 | HR 90 | Temp 97.8°F | Resp 18 | Ht 61.5 in | Wt 136.3 lb

## 2015-07-14 VITALS — BP 142/81 | HR 88 | Temp 98.2°F | Resp 18

## 2015-07-14 DIAGNOSIS — C8590 Non-Hodgkin lymphoma, unspecified, unspecified site: Secondary | ICD-10-CM

## 2015-07-14 DIAGNOSIS — I4891 Unspecified atrial fibrillation: Secondary | ICD-10-CM

## 2015-07-14 DIAGNOSIS — Z5112 Encounter for antineoplastic immunotherapy: Secondary | ICD-10-CM

## 2015-07-14 DIAGNOSIS — D638 Anemia in other chronic diseases classified elsewhere: Secondary | ICD-10-CM

## 2015-07-14 DIAGNOSIS — C8230 Follicular lymphoma grade IIIa, unspecified site: Secondary | ICD-10-CM | POA: Diagnosis not present

## 2015-07-14 DIAGNOSIS — I10 Essential (primary) hypertension: Secondary | ICD-10-CM

## 2015-07-14 DIAGNOSIS — Z006 Encounter for examination for normal comparison and control in clinical research program: Secondary | ICD-10-CM

## 2015-07-14 LAB — COMPREHENSIVE METABOLIC PANEL (CC13)
ALT: 12 U/L (ref 0–55)
AST: 20 U/L (ref 5–34)
Albumin: 3.4 g/dL — ABNORMAL LOW (ref 3.5–5.0)
Alkaline Phosphatase: 107 U/L (ref 40–150)
Anion Gap: 11 mEq/L (ref 3–11)
BUN: 17.7 mg/dL (ref 7.0–26.0)
CO2: 25 mEq/L (ref 22–29)
Calcium: 9.4 mg/dL (ref 8.4–10.4)
Chloride: 105 mEq/L (ref 98–109)
Creatinine: 1.1 mg/dL (ref 0.6–1.1)
EGFR: 50 mL/min/{1.73_m2} — ABNORMAL LOW (ref >90)
Glucose: 125 mg/dl (ref 70–140)
Potassium: 4.3 mEq/L (ref 3.5–5.1)
Sodium: 141 mEq/L (ref 136–145)
Total Bilirubin: 0.3 mg/dL (ref 0.20–1.20)
Total Protein: 6.4 g/dL (ref 6.4–8.3)

## 2015-07-14 LAB — CBC WITH DIFFERENTIAL/PLATELET
BASO%: 1 % (ref 0.0–2.0)
Basophils Absolute: 0.1 10*3/uL (ref 0.0–0.1)
EOS%: 6.3 % (ref 0.0–7.0)
Eosinophils Absolute: 0.4 10*3/uL (ref 0.0–0.5)
HCT: 30.9 % — ABNORMAL LOW (ref 34.8–46.6)
HGB: 10.1 g/dL — ABNORMAL LOW (ref 11.6–15.9)
LYMPH%: 10.3 % — ABNORMAL LOW (ref 14.0–49.7)
MCH: 30.5 pg (ref 25.1–34.0)
MCHC: 32.7 g/dL (ref 31.5–36.0)
MCV: 93.2 fL (ref 79.5–101.0)
MONO#: 0.5 10*3/uL (ref 0.1–0.9)
MONO%: 8.2 % (ref 0.0–14.0)
NEUT#: 4.2 10*3/uL (ref 1.5–6.5)
NEUT%: 74.2 % (ref 38.4–76.8)
Platelets: 322 10*3/uL (ref 145–400)
RBC: 3.32 10*6/uL — ABNORMAL LOW (ref 3.70–5.45)
RDW: 16.2 % — ABNORMAL HIGH (ref 11.2–14.5)
WBC: 5.7 10*3/uL (ref 3.9–10.3)
lymph#: 0.6 10*3/uL — ABNORMAL LOW (ref 0.9–3.3)

## 2015-07-14 LAB — LACTATE DEHYDROGENASE (CC13): LDH: 218 U/L (ref 125–245)

## 2015-07-14 MED ORDER — DIPHENHYDRAMINE HCL 25 MG PO CAPS
50.0000 mg | ORAL_CAPSULE | Freq: Once | ORAL | Status: AC
  Administered 2015-07-14: 50 mg via ORAL

## 2015-07-14 MED ORDER — SODIUM CHLORIDE 0.9 % IJ SOLN
10.0000 mL | INTRAMUSCULAR | Status: DC | PRN
  Administered 2015-07-14: 10 mL
  Filled 2015-07-14: qty 10

## 2015-07-14 MED ORDER — SODIUM CHLORIDE 0.9 % IV SOLN
Freq: Once | INTRAVENOUS | Status: AC
  Administered 2015-07-14: 10:00:00 via INTRAVENOUS

## 2015-07-14 MED ORDER — SODIUM CHLORIDE 0.9 % IV SOLN
375.0000 mg/m2 | Freq: Once | INTRAVENOUS | Status: AC
  Administered 2015-07-14: 600 mg via INTRAVENOUS
  Filled 2015-07-14: qty 60

## 2015-07-14 MED ORDER — SODIUM CHLORIDE 0.9 % IJ SOLN
10.0000 mL | INTRAMUSCULAR | Status: DC | PRN
  Administered 2015-07-14: 10 mL via INTRAVENOUS
  Filled 2015-07-14: qty 10

## 2015-07-14 MED ORDER — ACETAMINOPHEN 325 MG PO TABS
650.0000 mg | ORAL_TABLET | Freq: Once | ORAL | Status: AC
  Administered 2015-07-14: 650 mg via ORAL

## 2015-07-14 MED ORDER — ACETAMINOPHEN 325 MG PO TABS
ORAL_TABLET | ORAL | Status: AC
  Filled 2015-07-14: qty 2

## 2015-07-14 MED ORDER — DIPHENHYDRAMINE HCL 25 MG PO CAPS
ORAL_CAPSULE | ORAL | Status: AC
  Filled 2015-07-14: qty 2

## 2015-07-14 MED ORDER — HEPARIN SOD (PORK) LOCK FLUSH 100 UNIT/ML IV SOLN
500.0000 [IU] | Freq: Once | INTRAVENOUS | Status: AC | PRN
  Administered 2015-07-14: 500 [IU]
  Filled 2015-07-14: qty 5

## 2015-07-14 NOTE — Telephone Encounter (Signed)
Gave adn pritned appt sched and avs for pt for NOV °

## 2015-07-14 NOTE — Progress Notes (Signed)
07/14/2015 0950 PREVENT Met with the he patient after MD appointment.  This RN confirmed the patient is still willing to participate on the PREVENT study even though she is not taking study drug.  This RN discussed upcoming 6 month visit and requirements, including 6 month cardiac MRI and research labs.  The patient stated she would like to continue on study and would like to schedule the MRI sometime in November.  This RN contacted Bonnie in MRI and scheduled cardiac MRI for 09/03/15 @ 0900 after discussing with and getting approval for the date from the patient.  The patient will also have the nurse visit and neurocog. Testing on 09/03/15 after MRI.  Will obtain research labs at patient's November 21st appointment. This RN thanked the patient for her time and willingness to continue being part of the PREVENT trial. 

## 2015-07-14 NOTE — Progress Notes (Signed)
[facility] OFFICE PROGRESS NOTE  Patient Care Team: Arvella Nigh, MD as PCP - General (Obstetrics and Gynecology) Carola Frost, RN as Registered Nurse (Medical Oncology) [name] as Attending Physician (Internal Medicine)  SUMMARY OF ONCOLOGIC HISTORY:   Follicular lymphoma grade 3a   01/21/2015 Surgery She underwent excisional lymph node biopsy that came back follicular lymphoma grade 3   01/21/2015 Pathology Results Accession: [id number] biopsy confirmed follicular lymphoma   7/67/2094 Imaging Echocardiogram showed ejection fraction of 55-60%   02/11/2015 Imaging  PET CT scan show possible splenic involvement and diffuse lymphadenopathy throughout   02/11/2015 Bone Marrow Biopsy  bone marrow biopsy was performed and is involved by lymphoma with translocation of igH/BCL2   02/13/2015 Procedure She had port placement.   02/17/2015 - 06/02/2015 Chemotherapy She received R-CHOP chemo x 6   02/17/2015 Adverse Reaction She had mild infusion reaction with cycle 1 of treatment.   04/18/2015 Imaging PET CT scan showed near complete response to treatment.   04/22/2015 Adverse Reaction Vincristine dose was reduced by 50% due to neuropathy from cycle 4 onwards   06/25/2015 - 07/06/2015 Hospital Admission She was hospitalized for recent sepsis/bacteremia and a fib with RVR   07/11/2015 Imaging repeat PEt scan showed complete response to Rx    INTERVAL HISTORY: Please see below for problem oriented charting. She was released from skilled nursing facility this past weekend. She continues to feel weak. She received a corticosteroids injection in the right hip recently. She denies any dizziness. No nausea or vomiting.  REVIEW OF SYSTEMS:   Constitutional: Denies fevers, chills or abnormal weight loss Eyes: Denies blurriness of vision Ears, nose, mouth, throat, and face: Denies mucositis or sore throat Respiratory: Denies cough, dyspnea or wheezes Cardiovascular: Denies palpitation, chest  discomfort or lower extremity swelling Gastrointestinal:  Denies nausea, heartburn or change in bowel habits Skin: Denies abnormal skin rashes Lymphatics: Denies new lymphadenopathy or easy bruising Neurological:Denies numbness, tingling  Behavioral/Psych: Mood is stable, no new changes  All other systems were reviewed with the patient and are negative.  I have reviewed the past medical history, past surgical history, social history and family history with the patient and they are unchanged from previous note.  ALLERGIES:  has No Known Allergies.  MEDICATIONS:  Current Outpatient Prescriptions  Medication Sig Dispense Refill  . acetaminophen (TYLENOL) 325 MG tablet Take 650 mg by mouth every 6 (six) hours as needed.    Marland Kitchen aspirin 81 MG tablet Take 81 mg by mouth daily.    . calcium carbonate (TUMS) 500 MG chewable tablet Chew 2 tablets (400 mg of elemental calcium total) by mouth daily. 2 tablet 0  . diltiazem (CARDIZEM CD) 180 MG 24 hr capsule Take 1 capsule (180 mg total) by mouth daily. 30 capsule 0  . feeding supplement, ENSURE ENLIVE, (ENSURE ENLIVE) LIQD Take 237 mLs by mouth 2 (two) times daily between meals. 237 mL 0  . levothyroxine (SYNTHROID, LEVOTHROID) 100 MCG tablet Take 100 mcg by mouth daily.  9  . lidocaine-prilocaine (EMLA) cream Apply 1 application topically as needed. Apply to port a cath site one hour prior to needle stick 30 g 2  . loratadine (CLARITIN) 10 MG tablet Take 10 mg by mouth daily.    . Multiple Vitamins-Minerals (AIRBORNE GUMMIES PO) Take by mouth.    . ondansetron (ZOFRAN ODT) 8 MG disintegrating tablet Take 1 tablet (8 mg total) by mouth every 8 (eight) hours as needed for nausea or vomiting. 30 tablet  0  . oxyCODONE (OXY IR/ROXICODONE) 5 MG immediate release tablet Take 1 tablet (5 mg total) by mouth every 4 (four) hours as needed for moderate pain. 15 tablet 0  . pantoprazole (PROTONIX) 40 MG tablet Take 1 tablet (40 mg total) by mouth daily at 6 (six)  AM. 30 tablet 0  . prochlorperazine (COMPAZINE) 10 MG tablet Take 1 tablet by mouth every 6 (six) hours as needed. Nausea/vomiting.  6   No current facility-administered medications for this visit.   Facility-Administered Medications Ordered in Other Visits  Medication Dose Route Frequency Kiran Carline Last Rate Last Dose  . fludeoxyglucose F - 18 (FDG) injection 6.6 milli Curie  6.6 milli Curie Intravenous Once PRN Medication Radiologist, MD   6.6 milli Curie at 07/11/15 [phone]  . ondansetron (ZOFRAN) 8 mg in sodium chloride 0.9 % 50 mL IVPB   Intravenous Once [name], MD        PHYSICAL EXAMINATION: ECOG PERFORMANCE STATUS: 1 - Symptomatic but completely ambulatory  Filed Vitals:   07/14/15 0929  BP: 141/75  Pulse: 90  Temp: 97.8 F (36.6 C)  Resp: 18   Filed Weights   07/14/15 0929  Weight: 136 lb 4.8 oz (61.825 kg)    GENERAL:alert, no distress and comfortable SKIN: skin color, texture, turgor are normal, no rashes or significant lesions EYES: normal, Conjunctiva are pink and non-injected, sclera clear OROPHARYNX:no exudate, no erythema and lips, buccal mucosa, and tongue normal  NECK: supple, thyroid normal size, non-tender, without nodularity LYMPH:  no palpable lymphadenopathy in the cervical, axillary or inguinal LUNGS: clear to auscultation and percussion with normal breathing effort HEART: regular rate & rhythm and no murmurs and no lower extremity edema ABDOMEN:abdomen soft, non-tender and normal bowel sounds Musculoskeletal:no cyanosis of digits and no clubbing  NEURO: alert & oriented x 3 with fluent speech, no focal motor/sensory deficits  LABORATORY DATA:  I have reviewed the data as listed    Component Value Date/Time   NA 141 07/14/2015 0906   NA 137 07/04/2015 0537   K 4.3 07/14/2015 0906   K 3.0* 07/04/2015 0537   CL 104 07/04/2015 0537   CO2 25 07/14/2015 0906   CO2 24 07/04/2015 0537   GLUCOSE 125 07/14/2015 0906   GLUCOSE 153* 07/04/2015 0537    BUN 17.7 07/14/2015 0906   BUN 10 07/04/2015 0537   CREATININE 1.1 07/14/2015 0906   CREATININE 1.14* 07/04/2015 0537   CALCIUM 9.4 07/14/2015 0906   CALCIUM 8.1* 07/04/2015 0537   PROT 6.4 07/14/2015 0906   PROT 6.2* 06/25/2015 1215   ALBUMIN 3.4* 07/14/2015 0906   ALBUMIN 2.8* 06/25/2015 1215   AST 20 07/14/2015 0906   AST 157* 06/25/2015 1215   ALT 12 07/14/2015 0906   ALT 106* 06/25/2015 1215   ALKPHOS 107 07/14/2015 0906   ALKPHOS 106 06/25/2015 1215   BILITOT 0.30 07/14/2015 0906   BILITOT 0.7 06/25/2015 1215   GFRNONAA 47* 07/04/2015 0537   GFRAA 54* 07/04/2015 0537    No results found for: SPEP, UPEP  Lab Results  Component Value Date   WBC 5.7 07/14/2015   NEUTROABS 4.2 07/14/2015   HGB 10.1* 07/14/2015   HCT 30.9* 07/14/2015   MCV 93.2 07/14/2015   PLT 322 07/14/2015      Chemistry      Component Value Date/Time   NA 141 07/14/2015 0906   NA 137 07/04/2015 0537   K 4.3 07/14/2015 0906   K 3.0* 07/04/2015 0537   CL 104  07/04/2015 0537   CO2 25 07/14/2015 0906   CO2 24 07/04/2015 0537   BUN 17.7 07/14/2015 0906   BUN 10 07/04/2015 0537   CREATININE 1.1 07/14/2015 0906   CREATININE 1.14* 07/04/2015 0537      Component Value Date/Time   CALCIUM 9.4 07/14/2015 0906   CALCIUM 8.1* 07/04/2015 0537   ALKPHOS 107 07/14/2015 0906   ALKPHOS 106 06/25/2015 1215   AST 20 07/14/2015 0906   AST 157* 06/25/2015 1215   ALT 12 07/14/2015 0906   ALT 106* 06/25/2015 1215   BILITOT 0.30 07/14/2015 0906   BILITOT 0.7 06/25/2015 1215       RADIOGRAPHIC STUDIES: I reviewed the PET CT scan with her, compared with prior exam I have personally reviewed the radiological images as listed and agreed with the findings in the report.   ASSESSMENT & PLAN:  Follicular lymphoma grade 3a She has further recovered from recent hospitalization. PET CT scan showed complete response to treatment. We discussed the role of maintenance rituximab every other month for 2 years and  she agreed to proceed  Anemia of chronic disease This is likely anemia of chronic disease. The patient denies recent history of bleeding such as epistaxis, hematuria or hematochezia. She is asymptomatic from the anemia. We will observe for now.  She does not require transfusion now.    Essential hypertension she will continue current medical management. I recommend close follow-up with primary care doctor for medication adjustment. She also has recent atrial fibrillation with rapid ventricular response related to sepsis. She is not taking her diltiazem and I recommend she resume diltiazem as prescribed by cardiologist.   No orders of the defined types were placed in this encounter.   All questions were answered. The patient knows to call the clinic with any problems, questions or concerns. No barriers to learning was detected. I spent 25 minutes counseling the patient face to face. The total time spent in the appointment was 30 minutes and more than 50% was on counseling and review of test results     [facility], [place], MD 07/14/2015 10:09 AM

## 2015-07-14 NOTE — Assessment & Plan Note (Signed)
she will continue current medical management. I recommend close follow-up with primary care doctor for medication adjustment. She also has recent atrial fibrillation with rapid ventricular response related to sepsis. She is not taking her diltiazem and I recommend she resume diltiazem as prescribed by cardiologist.

## 2015-07-14 NOTE — Patient Instructions (Signed)

## 2015-07-14 NOTE — Assessment & Plan Note (Signed)
This is likely anemia of chronic disease. The patient denies recent history of bleeding such as epistaxis, hematuria or hematochezia. She is asymptomatic from the anemia. We will observe for now.  She does not require transfusion now.   

## 2015-07-14 NOTE — Patient Instructions (Signed)
[facility] Discharge Instructions for Patients Receiving Chemotherapy  Today you received the following chemotherapy agents: Rituxan   To help prevent nausea and vomiting after your treatment, we encourage you to take your nausea medication as directed.    If you develop nausea and vomiting that is not controlled by your nausea medication, call the clinic.   BELOW ARE SYMPTOMS THAT SHOULD BE REPORTED IMMEDIATELY:  *FEVER GREATER THAN 100.5 F  *CHILLS WITH OR WITHOUT FEVER  NAUSEA AND VOMITING THAT IS NOT CONTROLLED WITH YOUR NAUSEA MEDICATION  *UNUSUAL SHORTNESS OF BREATH  *UNUSUAL BRUISING OR BLEEDING  TENDERNESS IN MOUTH AND THROAT WITH OR WITHOUT PRESENCE OF ULCERS  *URINARY PROBLEMS  *BOWEL PROBLEMS  UNUSUAL RASH Items with * indicate a potential emergency and should be followed up as soon as possible.  Feel free to call the clinic you have any questions or concerns. The clinic phone number is [phone].  Please show the CHEMO ALERT CARD at check-in to the Emergency Department and triage nurse.   

## 2015-07-14 NOTE — Assessment & Plan Note (Signed)
She has further recovered from recent hospitalization. PET CT scan showed complete response to treatment. We discussed the role of maintenance rituximab every other month for 2 years and she agreed to proceed

## 2015-07-20 NOTE — Progress Notes (Signed)
Cardiology Office Note   Date:  07/21/2015   ID:  Fumi, Guadron 1941-07-10, MRN [id number]  PCP:  Darlyn Chamber, MD  Cardiologist:  Dr. Sherren Mocha   Electrophysiologist:  n/a  Chief Complaint  Patient presents with  . Hospitalization Follow-up    AFib with RVR     History of Present Illness: Carrie [name] is a 73 y.o. female with a hx of HTN, follicular lymphoma grade 3A. Myoview in 2010 was normal. She's had documented "white coat hypertension." Last seen by Dr. Burt Knack 3/15. Blood pressure was optimal on no therapy.  Admitted 9/7-9/16 with AF with RVR in the setting of Escherichia coli urosepsis complicated by AKI. There was minimal troponin elevation felt to be related to demand ischemia. She was seen by cardiology. CHADS2-VASc=2.  She converted to NSR with rate control therapy. Echo demonstrated normal LV function. As her arrhythmia occurred in the setting of acute illness and her current debilitated state was felt to be significant, anticoagulation was not recommended. Outpatient event monitor was recommended to assess for burden of atrial fibrillation before deciding on long-term commitment to anticoagulation. She returns for follow-up.  She was discharged from SNF about a week ago. She is doing well. She remains weak but is getting stronger. She denies chest discomfort or significant dyspnea. She denies palpitations. She denies syncope. She denies orthopnea, PND or edema. She denies any bleeding issues.   Studies/Reports Reviewed Today:  Venous Duplex 9/16 L Leg neg for DVT  Echo 06/26/15 Inferobasal AK, EF 55%, mild AI, mild MR, mild LAE  Echo 4/16 EF 55-60%, normal wall motion, grade 2 diastolic dysfunction, trivial AI, mild MR, moderate LAE, mild TR, trivial PI, PASP 33 mmHg  Myoview 11/10 Normal, EF 66%  Carotid US 6/05 No ICA stenosis   Past Medical History  Diagnosis Date  . DJD (degenerative joint disease)   . Hypothyroid   . Cervical  spondylosis without myelopathy 10/25/2013  . Dental bridge present   . Follicular lymphoma grade 3a ([place]) 02/03/2015  . Pneumonia   . Anemia     "years ago"  . Nausea without vomiting 06/20/2015  . Elevated troponin 02/18/2015  . Thrush of mouth and esophagus ([place]) 06/25/2015  . Atrial fibrillation with rapid ventricular response ([place]) 06/25/2015  . Stage III chronic kidney disease 07/01/2015    Past Surgical History  Procedure Laterality Date  . Patelar tendon transplants      Left/right  . Abdominal hysterectomy    . Dilation and curettage of uterus    . Total knee arthroplasty  2011    Right  . Appendectomy    . Ovarian cyst resection    . Back surgery      X5-lumbar-fusion  . Lymph node biopsy Right 01/21/2015    Procedure: RIGHT GROIN LYMPH NODE BIOPSY;  Surgeon: Erroll Luna, MD;  Location: [facility];  Service: General;  Laterality: Right;  . Colonoscopy    . Portacath placement Right 02/13/2015    Procedure: INSERTION PORT-A-CATH WITH ULTRASOUND;  Surgeon: Erroll Luna, MD;  Location: [facility];  Service: General;  Laterality: Right;     Current Outpatient Prescriptions  Medication Sig Dispense Refill  . acetaminophen (TYLENOL) 325 MG tablet Take 650 mg by mouth every 6 (six) hours as needed.    Marland Kitchen aspirin 81 MG tablet Take 81 mg by mouth daily.    . calcium carbonate (TUMS) 500 MG chewable tablet Chew 2 tablets (400 mg of elemental calcium  total) by mouth daily. (Patient taking differently: Chew 2 tablets by mouth daily as needed for heartburn. ) 2 tablet 0  . diltiazem (CARDIZEM CD) 180 MG 24 hr capsule Take 1 capsule (180 mg total) by mouth daily. 90 capsule 3  . levothyroxine (SYNTHROID, LEVOTHROID) 100 MCG tablet Take 100 mcg by mouth daily.  9  . lidocaine-prilocaine (EMLA) cream Apply 1 application topically as needed. Apply to port a cath site one hour prior to needle stick 30 g 2  . loratadine (CLARITIN) 10 MG tablet Take 10 mg by mouth daily.    .  ondansetron (ZOFRAN ODT) 8 MG disintegrating tablet Take 1 tablet (8 mg total) by mouth every 8 (eight) hours as needed for nausea or vomiting. 30 tablet 0  . prochlorperazine (COMPAZINE) 10 MG tablet Take 1 tablet by mouth every 6 (six) hours as needed. Nausea/vomiting.  6  . omeprazole (PRILOSEC OTC) 20 MG tablet Take 1 tablet (20 mg total) by mouth daily. TAKE FOR 2 WEEKS THEN STOP 14 tablet 0   No current facility-administered medications for this visit.   Facility-Administered Medications Ordered in Other Visits  Medication Dose Route Frequency Provider Last Rate Last Dose  . ondansetron (ZOFRAN) 8 mg in sodium chloride 0.9 % 50 mL IVPB   Intravenous Once [name], MD        Allergies:   Review of patient's allergies indicates no known allergies.    Social History:  The patient  reports that she has never smoked. She has never used smokeless tobacco. She reports that she drinks alcohol. She reports that she does not use illicit drugs.   Family History:  The patient's family history includes Cancer in her mother and sister.    ROS:   Please see the history of present illness.   Review of Systems  All other systems reviewed and are negative.     PHYSICAL EXAM: VS:  BP 128/76 mmHg  Pulse 79  Ht 5' 1.5" (1.562 m)  Wt 135 lb 6.4 oz (61.417 kg)  BMI 25.17 kg/m2    Wt Readings from Last 3 Encounters:  07/21/15 135 lb 6.4 oz (61.417 kg)  07/14/15 136 lb 4.8 oz (61.825 kg)  07/05/15 137 lb 8 oz (62.37 kg)     GEN: Well nourished, well developed, in no acute distress HEENT: normal Neck: no JVD,  no masses Cardiac:  Normal S1/S2, RRR; no murmur ,  no rubs or gallops, no edema   Respiratory:  clear to auscultation bilaterally, no wheezing, rhonchi or rales. GI: soft, nontender, nondistended, + BS MS: no deformity or atrophy Skin: warm and dry  Neuro:  CNs II-XII intact, Strength and sensation are intact Psych: Normal affect   EKG:  EKG is ordered today.  It demonstrates:    NSR, HR 79, normal axis, QTC 442 ms, nonspecific ST-T wave changes   Recent Labs: 06/18/2015: Magnesium 2.1 06/25/2015: TSH 0.851 07/14/2015: ALT 12; BUN 17.7; Creatinine 1.1; HGB 10.1*; Platelets 322; Potassium 4.3; Sodium 141    Lipid Panel No results found for: CHOL, TRIG, HDL, CHOLHDL, VLDL, LDLCALC, LDLDIRECT    ASSESSMENT AND PLAN:  1. Paroxysmal Atrial Fibrillation:  She developed AF with RVR in the setting of urosepsis, hypovolemia, metabolic derangement with hyponatremia and hypokalemia, AKI.  She had minimally elevated troponin levels with normal LVF on echo (demand ischemia). She was felt to be high risk for anticoagulation in the hospital.  FU event monitor was recommended to document AFib burden to determine  if anticoagulation needed.  She remains in NSR today.  -  CHADS2-VASc=2   -  Continue Cardizem  -  Arrange event monitor  2. HTN:  She has a hx of primarily white coat HTN.  BP has been controlled in the past without Rx.  She is now on Diltiazem in the setting of PAF.  BP is controlled. Continue current therapy.  3. Lymphoma:  FU with oncology as planned.  4. Anemia:  Hgb 9/26 was 10.1.  Anemia of chronic disease.     Medication Changes: Current medicines are reviewed at length with the patient today.  Concerns regarding medicines are as outlined above.  The following changes have been made:   Discontinued Medications   FEEDING SUPPLEMENT, ENSURE ENLIVE, (ENSURE ENLIVE) LIQD    Take 237 mLs by mouth 2 (two) times daily between meals.   MULTIPLE VITAMINS-MINERALS (AIRBORNE GUMMIES PO)    Take by mouth.   OXYCODONE (OXY IR/ROXICODONE) 5 MG IMMEDIATE RELEASE TABLET    Take 1 tablet (5 mg total) by mouth every 4 (four) hours as needed for moderate pain.   PANTOPRAZOLE (PROTONIX) 40 MG TABLET    Take 1 tablet (40 mg total) by mouth daily at 6 (six) AM.   Modified Medications   Modified Medication Previous Medication   DILTIAZEM (CARDIZEM CD) 180 MG 24 HR CAPSULE  diltiazem (CARDIZEM CD) 180 MG 24 hr capsule      Take 1 capsule (180 mg total) by mouth daily.    Take 1 capsule (180 mg total) by mouth daily.   New Prescriptions   OMEPRAZOLE (PRILOSEC OTC) 20 MG TABLET    Take 1 tablet (20 mg total) by mouth daily. TAKE FOR 2 WEEKS THEN STOP   Labs/ tests ordered today include:   Orders Placed This Encounter  Procedures  . Cardiac event monitor  . EKG 12-Lead    Disposition:    FU with Dr. Burt Knack or me 3 months.   Signed, Versie Starks, MHS 07/21/2015 3:19 PM    [facility] [place], [place], [place]  [address] Phone: [phone]; Fax: [phone]

## 2015-07-21 ENCOUNTER — Encounter: Payer: Self-pay | Admitting: Physician Assistant

## 2015-07-21 ENCOUNTER — Ambulatory Visit (INDEPENDENT_AMBULATORY_CARE_PROVIDER_SITE_OTHER): Payer: Medicare HMO | Admitting: Physician Assistant

## 2015-07-21 VITALS — BP 128/76 | HR 79 | Ht 61.5 in | Wt 135.4 lb

## 2015-07-21 DIAGNOSIS — I10 Essential (primary) hypertension: Secondary | ICD-10-CM

## 2015-07-21 DIAGNOSIS — I480 Paroxysmal atrial fibrillation: Secondary | ICD-10-CM | POA: Diagnosis not present

## 2015-07-21 MED ORDER — DILTIAZEM HCL ER COATED BEADS 180 MG PO CP24: 180.0000 mg | ORAL_CAPSULE | Freq: Every day | ORAL | Status: DC

## 2015-07-21 MED ORDER — OMEPRAZOLE MAGNESIUM 20 MG PO TBEC: 20.0000 mg | DELAYED_RELEASE_TABLET | Freq: Every day | ORAL | Status: DC

## 2015-07-21 NOTE — Patient Instructions (Addendum)
Medication Instructions:  1. START OTC PRILOSEC 20 MG DAILY FOR 2 WEEKS THEN STOP  2. A REFILL FOR DILTIAZEM WAS SENT IN Labwork: NONE  Testing/Procedures: Your physician has recommended that you wear an 30 DAY event monitor. Event monitors are medical devices that record the heart's electrical activity. Doctors most often [place] these monitors to diagnose arrhythmias. Arrhythmias are problems with the speed or rhythm of the heartbeat. The monitor is a small, portable device. You can wear one while you do your normal daily activities. This is usually used to diagnose what is causing palpitations/syncope (passing out).   Follow-Up: DR. Burt Knack OR SCOTT WEAVER, PAC IN 3 MONTHS  Any Other Special Instructions Will Be Listed Below (If Applicable).

## 2015-07-23 ENCOUNTER — Ambulatory Visit (INDEPENDENT_AMBULATORY_CARE_PROVIDER_SITE_OTHER): Payer: Medicare HMO

## 2015-07-23 DIAGNOSIS — I480 Paroxysmal atrial fibrillation: Secondary | ICD-10-CM

## 2015-07-24 ENCOUNTER — Encounter: Payer: Self-pay | Admitting: Internal Medicine

## 2015-08-04 ENCOUNTER — Telehealth: Payer: Self-pay | Admitting: *Deleted

## 2015-08-04 ENCOUNTER — Telehealth: Payer: Self-pay

## 2015-08-04 NOTE — Telephone Encounter (Signed)
LVM for pt I was returning her call.  Asked her to call nurse back.

## 2015-08-04 NOTE — Telephone Encounter (Signed)
Received transmission of current rhythm- rhythm is stable. Patient has an appointment with [name] PA tomorrow. Patient verbalized understanding.

## 2015-08-04 NOTE — Telephone Encounter (Signed)
[employer] called about patient's monitor. Patient has a rhythm of Atrial Flutter HR 160 for 12 seconds, today at 12:34. Called patient. Patient running errands at this time. Patient denies chest pain. Patient stated she does have some SOB, but just a little.   Patient called back stating now that she is having hot flashes, SOB, and weakness. Received fax from [facility]. Dr. Rayann Heman (DOD) looked at report, noted Atrial Fibrillation RVR sustained and to show to [facility] PA.  [name] PA has seen report and wants a reading of patient's current rhythm. Called patient back and requested for her push the button on her monitor to send a current reading to [employer]. Call [employer], they have not received reading yet, but will fax when they receive it from the patient.

## 2015-08-04 NOTE — Telephone Encounter (Signed)
Will see tomorrow to determine if ok to start anticoagulation [name], PA-C   08/04/2015 5:09 PM

## 2015-08-04 NOTE — Telephone Encounter (Signed)
Voicemail:  "Request return call from Dr. Calton Dach nurse.  [phone]."

## 2015-08-04 NOTE — Telephone Encounter (Signed)
[employer] never received transmission. Will have Shelly from monitor department to walk patient through the process.

## 2015-08-04 NOTE — Progress Notes (Addendum)
Cardiology Office Note   Date:  08/05/2015   ID:  Carrie [name] [birth year], MRN [id number]  PCP:  Darlyn Chamber, MD  Cardiologist:  Dr. Sherren Mocha   Electrophysiologist:  n/a  Chief Complaint  Patient presents with  . Atrial Fibrillation     History of Present Illness: Carrie [name] is a 74 y.o. female with a hx of HTN, follicular lymphoma grade 3A. Myoview in 2010 was normal. She's had documented "white coat hypertension." Last seen by Dr. Burt Knack 3/15. Blood pressure was optimal on no therapy.  Admitted 06/2015 with AF with RVR in the setting of Escherichia coli urosepsis complicated by AKI. There was minimal troponin elevation felt to be related to demand ischemia. She was seen by cardiology. CHADS2-VASc=2.  She converted to NSR with rate control therapy. Echo demonstrated normal LV function. As her arrhythmia occurred in the setting of acute illness and her current debilitated state was felt to be significant, anticoagulation was not recommended. Outpatient event monitor was arranged. We received a recording yesterday that demonstrated AF with RVR with HR in the 160s.  The patient was contacted and noted that she was weak.  A FU recording demonstrated return of NSR.  She returns to discuss her arrhythmia further.    She was not aware that she was in atrial fibrillation yesterday. She felt mildly short of breath with activities during the day. She's been weak since her illness in September. This is gradually improving. She denies chest discomfort. She denies syncope. She denies orthopnea, PND or edema.   Studies/Reports Reviewed Today:  Venous Duplex 9/16 L Leg neg for DVT  Echo 06/26/15 Inferobasal AK, EF 55%, mild AI, mild MR, mild LAE  Echo 4/16 EF 55-60%, normal wall motion, grade 2 diastolic dysfunction, trivial AI, mild MR, moderate LAE, mild TR, trivial PI, PASP 33 mmHg  Myoview 11/10 Normal, EF 66%  Carotid US 6/05 No ICA stenosis   Past Medical  History  Diagnosis Date  . DJD (degenerative joint disease)   . Hypothyroid   . Cervical spondylosis without myelopathy 10/25/2013  . Dental bridge present   . Follicular lymphoma grade 3a ([place]) 02/03/2015  . Pneumonia   . Anemia     "years ago"  . Nausea without vomiting 06/20/2015  . Elevated troponin 02/18/2015  . Thrush of mouth and esophagus ([place]) 06/25/2015  . Atrial fibrillation with rapid ventricular response ([place]) 06/25/2015  . Stage III chronic kidney disease 07/01/2015    Past Surgical History  Procedure Laterality Date  . Patelar tendon transplants      Left/right  . Abdominal hysterectomy    . Dilation and curettage of uterus    . Total knee arthroplasty  2011    Right  . Appendectomy    . Ovarian cyst resection    . Back surgery      X5-lumbar-fusion  . Lymph node biopsy Right 01/21/2015    Procedure: RIGHT GROIN LYMPH NODE BIOPSY;  Surgeon: Erroll Luna, MD;  Location: [facility];  Service: General;  Laterality: Right;  . Colonoscopy    . Portacath placement Right 02/13/2015    Procedure: INSERTION PORT-A-CATH WITH ULTRASOUND;  Surgeon: Erroll Luna, MD;  Location: [facility];  Service: General;  Laterality: Right;     Current Outpatient Prescriptions  Medication Sig Dispense Refill  . acetaminophen (TYLENOL) 325 MG tablet Take 650 mg by mouth every 6 (six) hours as needed.    Marland Kitchen aspirin 81 MG tablet Take 81  mg by mouth daily.    . calcium carbonate (TUMS) 500 MG chewable tablet Chew 2 tablets (400 mg of elemental calcium total) by mouth daily. (Patient taking differently: Chew 2 tablets by mouth daily as needed for heartburn. ) 2 tablet 0  . diltiazem (CARDIZEM CD) 240 MG 24 hr capsule Take 1 capsule (240 mg total) by mouth daily. 30 capsule 5  . levothyroxine (SYNTHROID, LEVOTHROID) 100 MCG tablet Take 100 mcg by mouth daily.  9  . lidocaine-prilocaine (EMLA) cream Apply 1 application topically as needed. Apply to port a cath site one hour prior to needle  stick 30 g 2  . loratadine (CLARITIN) 10 MG tablet Take 10 mg by mouth daily.    . ondansetron (ZOFRAN ODT) 8 MG disintegrating tablet Take 1 tablet (8 mg total) by mouth every 8 (eight) hours as needed for nausea or vomiting. 30 tablet 0  . prochlorperazine (COMPAZINE) 10 MG tablet Take 1 tablet by mouth every 6 (six) hours as needed. Nausea/vomiting.  6  . apixaban (ELIQUIS) 5 MG TABS tablet Take 1 tablet (5 mg total) by mouth 2 (two) times daily. 60 tablet 5   No current facility-administered medications for this visit.   Facility-Administered Medications Ordered in Other Visits  Medication Dose Route Frequency Provider Last Rate Last Dose  . ondansetron (ZOFRAN) 8 mg in sodium chloride 0.9 % 50 mL IVPB   Intravenous Once [name], MD        Allergies:   Review of patient's allergies indicates no known allergies.    Social History:  The patient  reports that she has never smoked. She has never used smokeless tobacco. She reports that she drinks alcohol. She reports that she does not use illicit drugs.   Family History:  The patient's family history includes Cancer in her mother and sister.    ROS:   Please see the history of present illness.   Review of Systems  All other systems reviewed and are negative.     PHYSICAL EXAM: VS:  BP 124/78 mmHg  Pulse 81  Ht 5' (1.524 m)  Wt 137 lb 3.2 oz (62.234 kg)  BMI 26.80 kg/m2    Wt Readings from Last 3 Encounters:  08/05/15 137 lb 3.2 oz (62.234 kg)  07/21/15 135 lb 6.4 oz (61.417 kg)  07/14/15 136 lb 4.8 oz (61.825 kg)     GEN: Well nourished, well developed, in no acute distress HEENT: normal Neck: no JVD,  no masses Cardiac:  Normal S1/S2, RRR; no murmur ,  no rubs or gallops, no edema   Respiratory:  clear to auscultation bilaterally, no wheezing, rhonchi or rales. GI: soft, nontender, nondistended, + BS MS: no deformity or atrophy Skin: warm and dry  Neuro:  CNs II-XII intact, Strength and sensation are  intact Psych: Normal affect   EKG:  EKG is ordered today.  It demonstrates:   NSR, HR 81, normal axis, QTC 457, no change from prior tracing   Recent Labs: 06/18/2015: Magnesium 2.1 06/25/2015: TSH 0.851 07/14/2015: ALT 12; BUN 17.7; Creatinine 1.1; HGB 10.1*; Platelets 322; Potassium 4.3; Sodium 141    Lipid Panel No results found for: CHOL, TRIG, HDL, CHOLHDL, VLDL, LDLCALC, LDLDIRECT    ASSESSMENT AND PLAN:  1. Paroxysmal Atrial Fibrillation:  She developed AF with RVR in the setting of urosepsis, hypovolemia, metabolic derangement with hyponatremia and hypokalemia, AKI.  She had minimally elevated troponin levels with normal LVF on echo (demand ischemia). She was felt to be  high risk for anticoagulation in the hospital when she was debilitated from her acute illness.  FU event monitor was arranged to document AFib burden to determine if anticoagulation needed.  She returns now after her event monitor has demonstrated recurrent AF with RVR.  CHADS2-VASc=2 (age, gender - 3 including HTN).  Annual stroke risk is 2.2 %.  We discussed the benefits of anticoagulation for stroke prevention and the risks of bleeding.  Her age is < 22 and her Creatinine is < 1.5.  She does not have a hx of bleeding and denies any hx of falls.  I think she is a good candidate for anticoagulation.    -  DC ASA.  -  Start Eliquis 5 mg Twice daily   -  FU with CVRR clinic in 6 weeks.  -  FU BMET, CBC 6 weeks.   -  Increase Diltiazem to 240 mg QD  -  Finish event monitor  2. HTN:  She has a hx of primarily white coat HTN.  BP has been controlled in the past without Rx.  She is now on Diltiazem in the setting of PAF.  BP is controlled. Continue current therapy.  3. Lymphoma:  FU with oncology as planned.  4. Anemia:  She has anemia of chronic disease. Repeat CBC in 6 weeks.      Medication Changes: Current medicines are reviewed at length with the patient today.  Concerns regarding medicines are as outlined  above.  The following changes have been made:   Discontinued Medications   OMEPRAZOLE (PRILOSEC OTC) 20 MG TABLET    Take 1 tablet (20 mg total) by mouth daily. TAKE FOR 2 WEEKS THEN STOP   Modified Medications   Modified Medication Previous Medication   DILTIAZEM (CARDIZEM CD) 240 MG 24 HR CAPSULE diltiazem (CARDIZEM CD) 180 MG 24 hr capsule      Take 1 capsule (240 mg total) by mouth daily.    Take 1 capsule (180 mg total) by mouth daily.   New Prescriptions   APIXABAN (ELIQUIS) 5 MG TABS TABLET    Take 1 tablet (5 mg total) by mouth 2 (two) times daily.   Labs/ tests ordered today include:   Orders Placed This Encounter  Procedures  . Basic metabolic panel  . CBC w/Diff  . EKG 12-Lead     Disposition:    FU CVRR in 6 weeks and keep FU with me in 10/2015 as planned.     Signed, Versie Starks, MHS 08/05/2015 4:51 PM    [facility] [place], [place], [place]  [address] Phone: [phone]; Fax: [phone]    08-07-15 Addendum: Pt with recurrent symptomatic AF with RVR. Reviewed with [name] and agree that trial of flecainide 50 mg BID and treadmill test in 2-3 weeks is indicated.  Sherren Mocha 08/07/2015 10:02 PM

## 2015-08-05 ENCOUNTER — Encounter: Payer: Self-pay | Admitting: Physician Assistant

## 2015-08-05 ENCOUNTER — Ambulatory Visit (INDEPENDENT_AMBULATORY_CARE_PROVIDER_SITE_OTHER): Payer: Medicare HMO | Admitting: Physician Assistant

## 2015-08-05 VITALS — BP 124/78 | HR 81 | Ht 60.0 in | Wt 137.2 lb

## 2015-08-05 DIAGNOSIS — C8230 Follicular lymphoma grade IIIa, unspecified site: Secondary | ICD-10-CM | POA: Diagnosis not present

## 2015-08-05 DIAGNOSIS — I480 Paroxysmal atrial fibrillation: Secondary | ICD-10-CM | POA: Diagnosis not present

## 2015-08-05 DIAGNOSIS — I10 Essential (primary) hypertension: Secondary | ICD-10-CM | POA: Diagnosis not present

## 2015-08-05 MED ORDER — APIXABAN 5 MG PO TABS: 5.0000 mg | ORAL_TABLET | Freq: Two times a day (BID) | ORAL | Status: DC

## 2015-08-05 MED ORDER — DILTIAZEM HCL ER COATED BEADS 240 MG PO CP24: 240.0000 mg | ORAL_CAPSULE | Freq: Every day | ORAL | Status: DC

## 2015-08-05 NOTE — Patient Instructions (Signed)
Medication Instructions:  Your physician has recommended you make the following change in your medication:  1) INCREASE Diltiazem to 240mg  daily. An Rx has been sent to your pharmacy 2) START Eliquis to 5mg  Twice daily. An Rx has been sent to your pharmacy   Labwork: Your physician recommends that you return for lab work in: 6 weeks (Bmet, Cbc)   Testing/Procedures: None ordered  Follow-Up: You have been referred to the Pharmacist for a new NOAC appointment in 6 weeks  Follow up as planned with Margaret Pyle in Jan 2017  Any Other Special Instructions Will Be Listed Below (If Applicable).

## 2015-08-07 ENCOUNTER — Telehealth: Payer: Self-pay | Admitting: *Deleted

## 2015-08-07 DIAGNOSIS — I4891 Unspecified atrial fibrillation: Secondary | ICD-10-CM

## 2015-08-07 MED ORDER — METOPROLOL SUCCINATE ER 25 MG PO TB24: 25.0000 mg | ORAL_TABLET | Freq: Every day | ORAL | Status: DC

## 2015-08-07 NOTE — Telephone Encounter (Signed)
Notified the pt back to report to her that her follow-up transmission that she sent to [place] for review at [phone] for DOD Dr Radford Pax to review, showed that she is currently in sinus rhythm with couplet PACs at a rate of 95 bpm.  Informed the pt that per Dr Radford Pax, she reviewed this and states that she is in fact back in NSR and she recommends that the pt start taking Toprol XL 25 mg po daily and continue with taking Eliquis and Cardizem as instructed by [name] PA-C.   Informed the pt that we will continue monitoring her event monitor. Confirmed the pharmacy of choice with the pt.  Pt verbalized understanding and agrees with this plan. Per Dr Radford Pax she request this message to be routed to [facility] for review, since he has been following the pt.

## 2015-08-07 NOTE — Telephone Encounter (Signed)
Showed the pts serious recorded event on her day 16 of monitoring and per Dr Radford Pax she request that I have a follow-up strip printed on the pt, and bring this back to her for further comparison and review current event and follow-up event.  Showed Dr Radford Pax the pts follow-up event noted at 0203 and the pt was then in afib with RVR at a rate of 170 bpm.  Per Dr Radford Pax she request the pt push her event monitor button now, and have the company process and fax this report ASAP, to eval if pt converted to NSR.   Contacted the pt to inform her that she should go ahead and press her event monitor button now and I will call the company to process and fax this report ASAP for Dr Radford Pax to review.  Informed the pt that once Dr Radford Pax reviews this most recent report, I will call her back with further recommendations.  Pt verbalized understanding and agrees with this plan.

## 2015-08-07 NOTE — Telephone Encounter (Signed)
[employer] faxed this pts event monitor report for day 16 at 12:21 am, the pt was noted to be in SVT at a rate of 163 bpm.  Contacted the pt to ask how she was feeling and what she was doing at the recorded time of event.  Per the pt she reports that she was still awake and went to the restroom.  Pt states she had no symptoms at recorded time of event. Pt states she is being compliant with taking all her meds prescribed with no missed doses. Informed the pt that I will go and show her monitor report to our DOD Dr Radford Pax for further review and recommendation of pts monitor, and follow-up with her thereafter.  Pt verbalized understanding and agrees with this plan.

## 2015-08-07 NOTE — Telephone Encounter (Signed)
Carrie [name] She is having recurrent AF with RVR.  Should we put her on Flecainide 50 mg bid with FU ETT or send her to AF clinic? Thanks [employer]

## 2015-08-08 NOTE — Telephone Encounter (Signed)
Have patient start Flecainide 50 mg Twice daily Continue other medications. Arrange ETT to rule out pro-arrhythmia in 1-2 weeks [name], PA-C   08/08/2015 1:25 PM

## 2015-08-11 MED ORDER — FLECAINIDE ACETATE 50 MG PO TABS: 50.0000 mg | ORAL_TABLET | Freq: Two times a day (BID) | ORAL | Status: DC

## 2015-08-11 NOTE — Addendum Note (Signed)
Addended by: Barkley Boards on: 08/11/2015 12:06 PM   Modules accepted: Orders

## 2015-08-11 NOTE — Telephone Encounter (Signed)
I spoke with the pt and made her aware of recommendations. Rx will be sent to the local pharmacy and then if she tolerates the medicine and GXT is okay she would like a Rx to take to the [place].  I will place order for GXT.

## 2015-08-12 NOTE — Telephone Encounter (Signed)
Pt scheduled for GXT on 08/20/15.

## 2015-08-13 ENCOUNTER — Telehealth: Payer: Self-pay | Admitting: *Deleted

## 2015-08-13 ENCOUNTER — Telehealth: Payer: Self-pay | Admitting: Physician Assistant

## 2015-08-13 NOTE — Telephone Encounter (Signed)
F/u  Pt requesting to speak w/ Arbie Cookey to follow up on previous note. Please call back and discuss.

## 2015-08-13 NOTE — Telephone Encounter (Signed)
New Message    Pt want to speak to scott weavers RN about explanation of medications  Pt did not want to go into detail, she said she will wait for rn call

## 2015-08-13 NOTE — Telephone Encounter (Signed)
Pt wanted to report to Dr. Alvy Bimler she is wearing a Heart Monitor and has been started on 3 new medications; Metoprolol, Eliquis, Tambocor and her Cardizem dose has been increased.  She also reports she is having Hot Flashes "all day long."  States she has had hot flashes for years, but they seem to have gone away while she was on chemo.  Now they are back.   Informed pt I will notify Dr. Alvy Bimler and the Research RN, [name] of above.   Will call her back if any new instructions, otherwise instructed pt to keep all her appts as scheduled and call back if any new problems/ questions.  She verbalized understanding.

## 2015-08-13 NOTE — Telephone Encounter (Signed)
I rtnd pt's call from earlier today asking for clarification of her medications

## 2015-08-13 NOTE — Telephone Encounter (Signed)
It is unlikely due to her disease/lymphoma She just had a PET scan done not long ago that looked normal I suggest she calls her cardiologist's office to see if any doses of medications need to be adjusted since her symptoms correlated with the new medications

## 2015-08-13 NOTE — Telephone Encounter (Signed)
I s/w pt who states her children had questions about increases and new medications added to her tx plan. I explained Diltiazem, Toprol, Flecainide help rate/ rhythm and a-fib. Pt thanked me for answering her questions. I explained they are all a little different from each other however have the same goal in helping the HR and Rhythm.

## 2015-08-20 ENCOUNTER — Ambulatory Visit (HOSPITAL_COMMUNITY)
Admission: RE | Admit: 2015-08-20 | Discharge: 2015-08-20 | Disposition: A | Discharge status: Home / Self Care | Payer: Medicare HMO | Source: Ambulatory Visit | Attending: Cardiovascular Disease | Admitting: Cardiovascular Disease

## 2015-08-20 DIAGNOSIS — I4891 Unspecified atrial fibrillation: Secondary | ICD-10-CM

## 2015-08-20 LAB — EXERCISE TOLERANCE TEST
Estimated workload: 6.3 METS
Exercise duration (min): 4 min
Exercise duration (sec): 25 s
MPHR: 148 {beats}/min
Peak HR: 111 {beats}/min
Percent HR: 75 %
RPE: 15
Rest HR: 73 {beats}/min

## 2015-08-21 DIAGNOSIS — M25519 Pain in unspecified shoulder: Secondary | ICD-10-CM | POA: Diagnosis not present

## 2015-08-21 DIAGNOSIS — Z23 Encounter for immunization: Secondary | ICD-10-CM | POA: Diagnosis not present

## 2015-08-27 ENCOUNTER — Telehealth: Payer: Self-pay | Admitting: Physician Assistant

## 2015-08-27 NOTE — Telephone Encounter (Signed)
New problem   Pt want to know results from her stress test that Scott order for her last week.

## 2015-08-27 NOTE — Telephone Encounter (Signed)
Will route phone message to PA for further advice. Pt is asking what her GXT results from the other day.

## 2015-08-28 NOTE — Telephone Encounter (Signed)
Lmom to advise pt that [place] did s/w Dr. Burt Knack who states test was adequate and agrees to continue Flecainide. If any questions cb [phone].

## 2015-08-28 NOTE — Telephone Encounter (Signed)
I s/w pt about her GXT results. Advised pt per Brynda Rim. PA no exercise induced Ventricular arrhythmias, though she did not reach the 85% target hear rate as planned. Advised continue Flecainide. Advised PA will d/w Dr. Burt Knack for further recommendations.

## 2015-08-28 NOTE — Telephone Encounter (Signed)
No exercise induced Ventricular arrhythmias. She did not get to 85% predicted maximal heart rate. I will review further with Dr. Sherren Mocha to see if we need any follow up testing. Ok to continue Flecainide for now. [name], PA-C   08/28/2015 8:03 AM

## 2015-09-01 ENCOUNTER — Telehealth: Payer: Self-pay | Admitting: Physician Assistant

## 2015-09-01 NOTE — Telephone Encounter (Signed)
Informed the pt that [place] with [name] PA-C called her last week to inform her that per Nicki Reaper and Dr Burt Knack, they state the pts gxt test was adequate and they agree for her to continue Flecainide.  Also informed the pt that per [name] PA-C she should continue all her other current cardiac meds like Eliquis, Toprol XL, and Diltiazem, as previously instructed.  Pt verbalized understanding and agrees with this plan.

## 2015-09-01 NOTE — Telephone Encounter (Signed)
New message ° ° ° ° °Returning a call to the nurse °

## 2015-09-03 ENCOUNTER — Encounter: Payer: Medicare HMO | Admitting: *Deleted

## 2015-09-03 ENCOUNTER — Ambulatory Visit (HOSPITAL_COMMUNITY)
Admission: RE | Admit: 2015-09-03 | Discharge: 2015-09-03 | Disposition: A | Discharge status: Home / Self Care | Payer: Medicare HMO | Source: Ambulatory Visit | Attending: Hematology and Oncology | Admitting: Hematology and Oncology

## 2015-09-03 ENCOUNTER — Encounter: Payer: Self-pay | Admitting: *Deleted

## 2015-09-03 DIAGNOSIS — Z006 Encounter for examination for normal comparison and control in clinical research program: Secondary | ICD-10-CM

## 2015-09-03 DIAGNOSIS — C8230 Follicular lymphoma grade IIIa, unspecified site: Secondary | ICD-10-CM

## 2015-09-03 NOTE — Progress Notes (Signed)
09/03/2015 1030  6 month PREVENT Patient arrived into the cancer center after having completed the 6 month cardiac MRI.  The  waist measurement was obtained.  She denied peripheral edema or other swelling.  The neuro cog. testing was completed by Lula Olszewski, CRA.  The patient will return on 09/08/15 to see Dr. Alvy Bimler.  The 6 month nurse visit, TAS, and research labs will be performed at the 09/08/15 visit.  Please note: the patient is not on study drug and is being followed per protocol.

## 2015-09-05 ENCOUNTER — Other Ambulatory Visit: Payer: Self-pay | Admitting: Hematology and Oncology

## 2015-09-05 DIAGNOSIS — S46012A Strain of muscle(s) and tendon(s) of the rotator cuff of left shoulder, initial encounter: Secondary | ICD-10-CM | POA: Diagnosis not present

## 2015-09-05 DIAGNOSIS — C8238 Follicular lymphoma grade IIIa, lymph nodes of multiple sites: Secondary | ICD-10-CM

## 2015-09-08 ENCOUNTER — Encounter: Payer: Self-pay | Admitting: *Deleted

## 2015-09-08 ENCOUNTER — Ambulatory Visit: Payer: Medicare HMO

## 2015-09-08 ENCOUNTER — Ambulatory Visit (HOSPITAL_BASED_OUTPATIENT_CLINIC_OR_DEPARTMENT_OTHER): Payer: Medicare HMO

## 2015-09-08 ENCOUNTER — Ambulatory Visit (HOSPITAL_BASED_OUTPATIENT_CLINIC_OR_DEPARTMENT_OTHER): Payer: Medicare Other | Admitting: Hematology and Oncology

## 2015-09-08 ENCOUNTER — Encounter: Payer: Self-pay | Admitting: Hematology and Oncology

## 2015-09-08 ENCOUNTER — Other Ambulatory Visit (HOSPITAL_BASED_OUTPATIENT_CLINIC_OR_DEPARTMENT_OTHER): Payer: Medicare HMO

## 2015-09-08 ENCOUNTER — Telehealth: Payer: Self-pay | Admitting: Hematology and Oncology

## 2015-09-08 VITALS — BP 119/60 | HR 60 | Temp 97.8°F | Resp 20

## 2015-09-08 VITALS — BP 130/67 | HR 58 | Temp 98.0°F | Resp 20 | Ht 61.0 in | Wt 136.7 lb

## 2015-09-08 DIAGNOSIS — C8238 Follicular lymphoma grade IIIa, lymph nodes of multiple sites: Secondary | ICD-10-CM

## 2015-09-08 DIAGNOSIS — C8230 Follicular lymphoma grade IIIa, unspecified site: Secondary | ICD-10-CM | POA: Diagnosis not present

## 2015-09-08 DIAGNOSIS — Z5112 Encounter for antineoplastic immunotherapy: Secondary | ICD-10-CM

## 2015-09-08 DIAGNOSIS — Z006 Encounter for examination for normal comparison and control in clinical research program: Secondary | ICD-10-CM

## 2015-09-08 DIAGNOSIS — I482 Chronic atrial fibrillation, unspecified: Secondary | ICD-10-CM | POA: Insufficient documentation

## 2015-09-08 DIAGNOSIS — L299 Pruritus, unspecified: Secondary | ICD-10-CM

## 2015-09-08 DIAGNOSIS — Z95828 Presence of other vascular implants and grafts: Secondary | ICD-10-CM

## 2015-09-08 LAB — CBC WITH DIFFERENTIAL/PLATELET
BASO%: 0.5 % (ref 0.0–2.0)
Basophils Absolute: 0 10*3/uL (ref 0.0–0.1)
EOS%: 1.8 % (ref 0.0–7.0)
Eosinophils Absolute: 0.2 10*3/uL (ref 0.0–0.5)
HCT: 38 % (ref 34.8–46.6)
HGB: 12.4 g/dL (ref 11.6–15.9)
LYMPH%: 9 % — ABNORMAL LOW (ref 14.0–49.7)
MCH: 28.6 pg (ref 25.1–34.0)
MCHC: 32.8 g/dL (ref 31.5–36.0)
MCV: 87.1 fL (ref 79.5–101.0)
MONO#: 0.6 10*3/uL (ref 0.1–0.9)
MONO%: 7.1 % (ref 0.0–14.0)
NEUT#: 7.4 10*3/uL — ABNORMAL HIGH (ref 1.5–6.5)
NEUT%: 81.6 % — ABNORMAL HIGH (ref 38.4–76.8)
Platelets: 286 10*3/uL (ref 145–400)
RBC: 4.36 10*6/uL (ref 3.70–5.45)
RDW: 14.8 % — ABNORMAL HIGH (ref 11.2–14.5)
WBC: 9.1 10*3/uL (ref 3.9–10.3)
lymph#: 0.8 10*3/uL — ABNORMAL LOW (ref 0.9–3.3)

## 2015-09-08 LAB — COMPREHENSIVE METABOLIC PANEL (CC13)
ALT: 12 U/L (ref 0–55)
AST: 15 U/L (ref 5–34)
Albumin: 3.9 g/dL (ref 3.5–5.0)
Alkaline Phosphatase: 100 U/L (ref 40–150)
Anion Gap: 11 mEq/L (ref 3–11)
BUN: 35.3 mg/dL — ABNORMAL HIGH (ref 7.0–26.0)
CO2: 21 mEq/L — ABNORMAL LOW (ref 22–29)
Calcium: 9.4 mg/dL (ref 8.4–10.4)
Chloride: 108 mEq/L (ref 98–109)
Creatinine: 1.2 mg/dL — ABNORMAL HIGH (ref 0.6–1.1)
EGFR: 44 mL/min/{1.73_m2} — ABNORMAL LOW (ref >90)
Glucose: 137 mg/dl (ref 70–140)
Potassium: 4 mEq/L (ref 3.5–5.1)
Sodium: 139 mEq/L (ref 136–145)
Total Bilirubin: <0.3 mg/dL (ref 0.20–1.20)
Total Protein: 6.9 g/dL (ref 6.4–8.3)

## 2015-09-08 LAB — RESEARCH LABS

## 2015-09-08 LAB — LACTATE DEHYDROGENASE (CC13): LDH: 169 U/L (ref 125–245)

## 2015-09-08 MED ORDER — SODIUM CHLORIDE 0.9 % IJ SOLN
10.0000 mL | INTRAMUSCULAR | Status: DC | PRN
  Administered 2015-09-08: 10 mL
  Filled 2015-09-08: qty 10

## 2015-09-08 MED ORDER — HEPARIN SOD (PORK) LOCK FLUSH 100 UNIT/ML IV SOLN
500.0000 [IU] | Freq: Once | INTRAVENOUS | Status: AC | PRN
  Administered 2015-09-08: 500 [IU]
  Filled 2015-09-08: qty 5

## 2015-09-08 MED ORDER — SODIUM CHLORIDE 0.9 % IV SOLN
375.0000 mg/m2 | Freq: Once | INTRAVENOUS | Status: AC
  Administered 2015-09-08: 600 mg via INTRAVENOUS
  Filled 2015-09-08: qty 60

## 2015-09-08 MED ORDER — DIPHENHYDRAMINE HCL 25 MG PO CAPS
ORAL_CAPSULE | ORAL | Status: AC
  Filled 2015-09-08: qty 2

## 2015-09-08 MED ORDER — ACETAMINOPHEN 325 MG PO TABS
ORAL_TABLET | ORAL | Status: AC
  Filled 2015-09-08: qty 2

## 2015-09-08 MED ORDER — SODIUM CHLORIDE 0.9 % IJ SOLN
10.0000 mL | INTRAMUSCULAR | Status: DC | PRN
  Administered 2015-09-08: 10 mL via INTRAVENOUS
  Filled 2015-09-08: qty 10

## 2015-09-08 MED ORDER — ACETAMINOPHEN 325 MG PO TABS
650.0000 mg | ORAL_TABLET | Freq: Once | ORAL | Status: AC
  Administered 2015-09-08: 650 mg via ORAL

## 2015-09-08 MED ORDER — SODIUM CHLORIDE 0.9 % IV SOLN
Freq: Once | INTRAVENOUS | Status: AC
  Administered 2015-09-08: 11:00:00 via INTRAVENOUS

## 2015-09-08 MED ORDER — DIPHENHYDRAMINE HCL 25 MG PO CAPS
50.0000 mg | ORAL_CAPSULE | Freq: Once | ORAL | Status: AC
  Administered 2015-09-08: 50 mg via ORAL

## 2015-09-08 NOTE — Progress Notes (Signed)
[place] OFFICE PROGRESS NOTE  Patient Care Team: Arvella Nigh, MD as PCP - General (Obstetrics and Gynecology) Carola Frost, RN as Registered Nurse (Medical Oncology) [name] as Attending Physician (Internal Medicine)  SUMMARY OF ONCOLOGIC HISTORY:   Follicular lymphoma grade 3a ([place])   01/21/2015 Surgery She underwent excisional lymph node biopsy that came back follicular lymphoma grade 3   01/21/2015 Pathology Results Accession: [id number] biopsy confirmed follicular lymphoma   0/25/8527 Imaging Echocardiogram showed ejection fraction of 55-60%   02/11/2015 Imaging  PET CT scan show possible splenic involvement and diffuse lymphadenopathy throughout   02/11/2015 Bone Marrow Biopsy  bone marrow biopsy was performed and is involved by lymphoma with translocation of igH/BCL2   02/13/2015 Procedure She had port placement.   02/17/2015 - 06/02/2015 Chemotherapy She received R-CHOP chemo x 6   02/17/2015 Adverse Reaction She had mild infusion reaction with cycle 1 of treatment.   04/18/2015 Imaging PET CT scan showed near complete response to treatment.   04/22/2015 Adverse Reaction Vincristine dose was reduced by 50% due to neuropathy from cycle 4 onwards   06/25/2015 - 07/06/2015 Hospital Admission She was hospitalized for recent sepsis/bacteremia and a fib with RVR   07/11/2015 Imaging repeat PEt scan showed complete response to Rx    INTERVAL HISTORY: Please see below for problem oriented charting.  she returns for further follow-up. She complained of intermittent hot flashes and skin itching.  her heart appears to be well controlled and she denies any chest pain or shortness of breath. She is currently on chronic anticoagulation therapy. The patient denies any recent signs or symptoms of bleeding such as spontaneous epistaxis, hematuria or hematochezia.  She denies new lymphadenopathy REVIEW OF SYSTEMS:   Constitutional: Denies fevers, chills or abnormal weight loss Eyes:  Denies blurriness of vision Ears, nose, mouth, throat, and face: Denies mucositis or sore throat Respiratory: Denies cough, dyspnea or wheezes Cardiovascular: Denies palpitation, chest discomfort or lower extremity swelling Gastrointestinal:  Denies nausea, heartburn or change in bowel habits Skin: Denies abnormal skin rashes Lymphatics: Denies new lymphadenopathy or easy bruising Neurological:Denies numbness, tingling or new weaknesses Behavioral/Psych: Mood is stable, no new changes  All other systems were reviewed with the patient and are negative.  I have reviewed the past medical history, past surgical history, social history and family history with the patient and they are unchanged from previous note.  ALLERGIES:  has No Known Allergies.  MEDICATIONS:  Current Outpatient Prescriptions  Medication Sig Dispense Refill  . acetaminophen (TYLENOL) 325 MG tablet Take 650 mg by mouth every 6 (six) hours as needed.    Marland Kitchen apixaban (ELIQUIS) 5 MG TABS tablet Take 1 tablet (5 mg total) by mouth 2 (two) times daily. 60 tablet 5  . calcium carbonate (TUMS) 500 MG chewable tablet Chew 2 tablets (400 mg of elemental calcium total) by mouth daily. (Patient taking differently: Chew 2 tablets by mouth daily as needed for heartburn. ) 2 tablet 0  . diltiazem (CARDIZEM CD) 240 MG 24 hr capsule Take 1 capsule (240 mg total) by mouth daily. 30 capsule 5  . flecainide (TAMBOCOR) 50 MG tablet Take 1 tablet (50 mg total) by mouth 2 (two) times daily. 60 tablet 3  . levothyroxine (SYNTHROID, LEVOTHROID) 100 MCG tablet Take 100 mcg by mouth daily.  9  . lidocaine-prilocaine (EMLA) cream Apply 1 application topically as needed. Apply to port a cath site one hour prior to needle stick 30 g 2  . loratadine (  CLARITIN) 10 MG tablet Take 10 mg by mouth daily as needed.     . metoprolol succinate (TOPROL XL) 25 MG 24 hr tablet Take 1 tablet (25 mg total) by mouth daily. 90 tablet 3  . ondansetron (ZOFRAN ODT) 8 MG  disintegrating tablet Take 1 tablet (8 mg total) by mouth every 8 (eight) hours as needed for nausea or vomiting. 30 tablet 0  . prochlorperazine (COMPAZINE) 10 MG tablet Take 1 tablet by mouth every 6 (six) hours as needed. Nausea/vomiting.  6   No current facility-administered medications for this visit.   Facility-Administered Medications Ordered in Other Visits  Medication Dose Route Frequency Provider Last Rate Last Dose  . heparin lock flush 100 unit/mL  500 Units Intracatheter Once PRN [name], MD      . ondansetron (ZOFRAN) 8 mg in sodium chloride 0.9 % 50 mL IVPB   Intravenous Once [name], MD      . sodium chloride 0.9 % injection 10 mL  10 mL Intracatheter PRN [name], MD        PHYSICAL EXAMINATION: ECOG PERFORMANCE STATUS: 1 - Symptomatic but completely ambulatory  Filed Vitals:   09/08/15 0958  BP: 130/67  Pulse: 58  Temp: 98 F (36.7 C)  Resp: 20   Filed Weights   09/08/15 0958  Weight: 136 lb 11.2 oz (62.007 kg)    GENERAL:alert, no distress and comfortable SKIN: skin color, texture, turgor are normal, no rashes or significant lesions EYES: normal, Conjunctiva are pink and non-injected, sclera clear OROPHARYNX:no exudate, no erythema and lips, buccal mucosa, and tongue normal  NECK: supple, thyroid normal size, non-tender, without nodularity LYMPH:  no palpable lymphadenopathy in the cervical, axillary or inguinal LUNGS: clear to auscultation and percussion with normal breathing effort HEART: regular rate & rhythm and no murmurs and no lower extremity edema ABDOMEN:abdomen soft, non-tender and normal bowel sounds Musculoskeletal:no cyanosis of digits and no clubbing  NEURO: alert & oriented x 3 with fluent speech, no focal motor/sensory deficits  LABORATORY DATA:  I have reviewed the data as listed    Component Value Date/Time   NA 139 09/08/2015 0909   NA 137 07/04/2015 0537   K 4.0 09/08/2015 0909   K 3.0* 07/04/2015 0537   CL 104 07/04/2015  0537   CO2 21* 09/08/2015 0909   CO2 24 07/04/2015 0537   GLUCOSE 137 09/08/2015 0909   GLUCOSE 153* 07/04/2015 0537   BUN 35.3* 09/08/2015 0909   BUN 10 07/04/2015 0537   CREATININE 1.2* 09/08/2015 0909   CREATININE 1.14* 07/04/2015 0537   CALCIUM 9.4 09/08/2015 0909   CALCIUM 8.1* 07/04/2015 0537   PROT 6.9 09/08/2015 0909   PROT 6.2* 06/25/2015 1215   ALBUMIN 3.9 09/08/2015 0909   ALBUMIN 2.8* 06/25/2015 1215   AST 15 09/08/2015 0909   AST 157* 06/25/2015 1215   ALT 12 09/08/2015 0909   ALT 106* 06/25/2015 1215   ALKPHOS 100 09/08/2015 0909   ALKPHOS 106 06/25/2015 1215   BILITOT <0.30 09/08/2015 0909   BILITOT 0.7 06/25/2015 1215   GFRNONAA 47* 07/04/2015 0537   GFRAA 54* 07/04/2015 0537    No results found for: SPEP, UPEP  Lab Results  Component Value Date   WBC 9.1 09/08/2015   NEUTROABS 7.4* 09/08/2015   HGB 12.4 09/08/2015   HCT 38.0 09/08/2015   MCV 87.1 09/08/2015   PLT 286 09/08/2015      Chemistry      Component Value Date/Time  NA 139 09/08/2015 0909   NA 137 07/04/2015 0537   K 4.0 09/08/2015 0909   K 3.0* 07/04/2015 0537   CL 104 07/04/2015 0537   CO2 21* 09/08/2015 0909   CO2 24 07/04/2015 0537   BUN 35.3* 09/08/2015 0909   BUN 10 07/04/2015 0537   CREATININE 1.2* 09/08/2015 0909   CREATININE 1.14* 07/04/2015 0537      Component Value Date/Time   CALCIUM 9.4 09/08/2015 0909   CALCIUM 8.1* 07/04/2015 0537   ALKPHOS 100 09/08/2015 0909   ALKPHOS 106 06/25/2015 1215   AST 15 09/08/2015 0909   AST 157* 06/25/2015 1215   ALT 12 09/08/2015 0909   ALT 106* 06/25/2015 1215   BILITOT <0.30 09/08/2015 0909   BILITOT 0.7 06/25/2015 1215     ASSESSMENT & PLAN:  Grade 3a follicular lymphoma of lymph nodes of multiple regions [facility]) She feels well PET CT scan from September 2016 showed complete response to treatment. We discussed the role of maintenance rituximab every other month for 2 years and she agreed to proceed Her next imaging study  would be done in March 2017.  Chronic atrial fibrillation (HCC)  She is on multiple different medications for rate control for atrial fibrillation. She complained of occasional skin itching and intermittent hot flashes. Examination did not suggest cancer recurrence. It is not clear to me whether her symptoms could be related to some of her medications that she take for atrial fibrillation. I recommend conservative management.  Itch of skin  She complained of intermittent skin itching but no skin rash.  I recommend over-the-counter Benadryl and skin moisturizer.  Clinical examination is not suspicious for cancer recurrence   Orders Placed This Encounter  Procedures  . CBC with Differential/Platelet    Standing Status: Future     Number of Occurrences:      Standing Expiration Date: 10/12/2016  . Comprehensive metabolic panel    Standing Status: Future     Number of Occurrences:      Standing Expiration Date: 10/12/2016  . Lactate dehydrogenase    Standing Status: Future     Number of Occurrences:      Standing Expiration Date: 10/12/2016   All questions were answered. The patient knows to call the clinic with any problems, questions or concerns. No barriers to learning was detected. I spent 20 minutes counseling the patient face to face. The total time spent in the appointment was 25 minutes and more than 50% was on counseling and review of test results     [facility], [place], MD 09/08/2015 12:31 PM

## 2015-09-08 NOTE — Patient Instructions (Signed)
[facility] Discharge Instructions for Patients Receiving Chemotherapy  Today you received the following chemotherapy agents rituxan  To help prevent nausea and vomiting after your treatment, we encourage you to take your nausea medication   If you develop nausea and vomiting that is not controlled by your nausea medication, call the clinic.   BELOW ARE SYMPTOMS THAT SHOULD BE REPORTED IMMEDIATELY:  *FEVER GREATER THAN 100.5 F  *CHILLS WITH OR WITHOUT FEVER  NAUSEA AND VOMITING THAT IS NOT CONTROLLED WITH YOUR NAUSEA MEDICATION  *UNUSUAL SHORTNESS OF BREATH  *UNUSUAL BRUISING OR BLEEDING  TENDERNESS IN MOUTH AND THROAT WITH OR WITHOUT PRESENCE OF ULCERS  *URINARY PROBLEMS  *BOWEL PROBLEMS  UNUSUAL RASH Items with * indicate a potential emergency and should be followed up as soon as possible.  Feel free to call the clinic you have any questions or concerns. The clinic phone number is [phone].  Please show the CHEMO ALERT CARD at check-in to the Emergency Department and triage nurse.   

## 2015-09-08 NOTE — Assessment & Plan Note (Signed)
She complained of intermittent skin itching but no skin rash.  I recommend over-the-counter Benadryl and skin moisturizer.  Clinical examination is not suspicious for cancer recurrence

## 2015-09-08 NOTE — Progress Notes (Signed)
09/08/2015 0945  PREVENT 6 month (cont.) The patient returns to clinic to see MD for assessment and continued treatment.  She had VS and 6 month research labs done.  She is not on study drug, but has agreed to continue on study and be followed.  Her current medications were obtained.  She reports that she is seeing a cardiologist.  Per MD, she remains in clinical remission.  Will report research lab results when available.  This RN thanked the patient for her willingness to continue follow-up on study.

## 2015-09-08 NOTE — Assessment & Plan Note (Signed)
She is on multiple different medications for rate control for atrial fibrillation. She complained of occasional skin itching and intermittent hot flashes. Examination did not suggest cancer recurrence. It is not clear to me whether her symptoms could be related to some of her medications that she take for atrial fibrillation. I recommend conservative management.

## 2015-09-08 NOTE — Assessment & Plan Note (Signed)
She feels well PET CT scan from September 2016 showed complete response to treatment. We discussed the role of maintenance rituximab every other month for 2 years and she agreed to proceed Her next imaging study would be done in March 2017.

## 2015-09-08 NOTE — Telephone Encounter (Signed)
Appointments complete per 11/21 pof. Gave patient avs report and appointments for January.

## 2015-09-19 ENCOUNTER — Ambulatory Visit (INDEPENDENT_AMBULATORY_CARE_PROVIDER_SITE_OTHER): Payer: Medicare HMO | Admitting: *Deleted

## 2015-09-19 ENCOUNTER — Ambulatory Visit: Payer: Medicare HMO | Admitting: Pharmacist

## 2015-09-19 ENCOUNTER — Other Ambulatory Visit (INDEPENDENT_AMBULATORY_CARE_PROVIDER_SITE_OTHER): Payer: Medicare HMO

## 2015-09-19 DIAGNOSIS — I480 Paroxysmal atrial fibrillation: Secondary | ICD-10-CM

## 2015-09-19 DIAGNOSIS — I4891 Unspecified atrial fibrillation: Secondary | ICD-10-CM | POA: Diagnosis not present

## 2015-09-19 LAB — CBC WITH DIFFERENTIAL/PLATELET
Basophils Absolute: 0 K/uL (ref 0.0–0.1)
Basophils Relative: 0 % (ref 0–1)
Eosinophils Absolute: 0.1 K/uL (ref 0.0–0.7)
Eosinophils Relative: 1 % (ref 0–5)
HCT: 38.4 % (ref 36.0–46.0)
Hemoglobin: 12.6 g/dL (ref 12.0–15.0)
Lymphocytes Relative: 8 % — ABNORMAL LOW (ref 12–46)
Lymphs Abs: 0.7 K/uL (ref 0.7–4.0)
MCH: 28.4 pg (ref 26.0–34.0)
MCHC: 32.8 g/dL (ref 30.0–36.0)
MCV: 86.5 fL (ref 78.0–100.0)
MPV: 9.2 fL (ref 8.6–12.4)
Monocytes Absolute: 0.8 K/uL (ref 0.1–1.0)
Monocytes Relative: 9 % (ref 3–12)
Neutro Abs: 7.6 K/uL (ref 1.7–7.7)
Neutrophils Relative %: 82 % — ABNORMAL HIGH (ref 43–77)
Platelets: 272 K/uL (ref 150–400)
RBC: 4.44 MIL/uL (ref 3.87–5.11)
RDW: 14.8 % (ref 11.5–15.5)
WBC: 9.3 K/uL (ref 4.0–10.5)

## 2015-09-19 LAB — BASIC METABOLIC PANEL WITH GFR
BUN: 31 mg/dL — ABNORMAL HIGH (ref 7–25)
CO2: 25 mmol/L (ref 20–31)
Calcium: 9.1 mg/dL (ref 8.6–10.4)
Chloride: 103 mmol/L (ref 98–110)
Creat: 1.27 mg/dL — ABNORMAL HIGH (ref 0.60–0.93)
Glucose, Bld: 115 mg/dL — ABNORMAL HIGH (ref 65–99)
Potassium: 4.2 mmol/L (ref 3.5–5.3)
Sodium: 137 mmol/L (ref 135–146)

## 2015-09-19 NOTE — Progress Notes (Signed)
Pt was started on Eliquis 5mg  twice a day for AFIB by Dr. Burt Knack on 08/06/2015.    Reviewed patients medication list.  Pt is not currently on any combined P-gp and strong CYP3A4 inhibitors/inducers (ketoconazole, traconazole, ritonavir, carbamazepine, phenytoin, rifampin, St. John's wort).  Reviewed labs:  SCr-1.27, Hgb-12.6, HCT-38.4, and Weight 63.9kg.  Dose appropriate based on dosing criteria.   Hgb and HCT Within Normal Limits.   A full discussion of the nature of anticoagulants has been carried out.  A benefit/risk analysis has been presented to the patient, so that they understand the justification for choosing anticoagulation with Eliquis at this time.  The need for compliance is stressed.  Pt is aware to take the medication twice daily.  Side effects of potential bleeding are discussed, including unusual colored urine or stools, coughing up blood or coffee ground emesis, nose bleeds or serious fall or head trauma.  Discussed signs and symptoms of stroke. The patient should avoid any OTC items containing aspirin or ibuprofen.  Avoid alcohol consumption.   Call if any signs of abnormal bleeding.  Discussed financial obligations and resolved any difficulty in obtaining medication.  Next lab test test in 6 months.   09/19/15-Patient taken to the lab for blood work. Instructed would call once labs resulted.   09/22/15 Called and spoke with patient, discussed labwork that was obtained on Friday. Instructed to continue taking Eliquis 5mg  twice a day and to call with any issues that occur. The patient verbalized understanding and appt set for 6 months.

## 2015-09-23 ENCOUNTER — Telehealth: Payer: Self-pay | Admitting: *Deleted

## 2015-09-23 NOTE — Telephone Encounter (Signed)
Lmptcb for lab results and to advise pt to continue on Eliquis 5 mg BID.

## 2015-09-26 NOTE — Telephone Encounter (Signed)
Pt notified of lab results and to continue current dose of Eliquis 5 mg. Pt verbalized understanding to plan of care.

## 2015-09-29 DIAGNOSIS — S46012D Strain of muscle(s) and tendon(s) of the rotator cuff of left shoulder, subsequent encounter: Secondary | ICD-10-CM | POA: Diagnosis not present

## 2015-09-30 ENCOUNTER — Telehealth: Payer: Self-pay | Admitting: Physician Assistant

## 2015-09-30 DIAGNOSIS — T451X5D Adverse effect of antineoplastic and immunosuppressive drugs, subsequent encounter: Secondary | ICD-10-CM

## 2015-09-30 NOTE — Telephone Encounter (Signed)
LMTCB for pt to let her know we need to schedule limited echo.

## 2015-09-30 NOTE — Telephone Encounter (Signed)
This patient recently had a research echo with a lower EF than a recent Echo here.  Research echo was done b/c patient is on Chemotherapy.  Please schedule a limited echo to check EF and PASP.  Order is in [place].  Thank you, [name], PA-C   09/30/2015 8:26 AM

## 2015-10-01 ENCOUNTER — Telehealth: Payer: Self-pay | Admitting: Physician Assistant

## 2015-10-01 NOTE — Telephone Encounter (Signed)
Returning call from yesterday.  Advised that Carrie [name] wanted to schedule a limited echo.  She is agreeable.  Order was placed. Will let schedulers know to schedule.

## 2015-10-01 NOTE — Telephone Encounter (Signed)
New message ° ° ° ° °Returning a call to the nurse °

## 2015-10-03 ENCOUNTER — Encounter: Payer: Self-pay | Admitting: Hematology and Oncology

## 2015-10-07 NOTE — Telephone Encounter (Signed)
Lmptcb to schedule echo, though while I was lmom I saw pt was already scheduled for echo 12/28. Stated on machine to disregard my call today and we will see her 12/28 for echo.

## 2015-10-13 NOTE — Progress Notes (Signed)
Cardiology Office Note    Date:  10/14/2015   ID:  Carrie, [name] [birth year], MRN [id number]  PCP:  Darlyn Chamber, MD  Cardiologist:  Dr. Sherren Mocha   Electrophysiologist:  n/a  Chief Complaint  Patient presents with  . Follow-up  . Atrial Fibrillation  . Shortness of Breath    History of Present Illness:  Carrie [name] is a 74 y.o. female with a hx of HTN, follicular lymphoma grade 3A. Myoview in 2010 was normal. She's had documented "white coat hypertension." Last seen by Dr. Burt Knack 3/15. Blood pressure was optimal on no therapy.  Admitted 06/2015 with AF with RVR in the setting of Escherichia coli urosepsis complicated by AKI. There was minimal troponin elevation felt to be related to demand ischemia. She was seen by cardiology. CHADS2-VASc=2. She converted to NSR with rate control therapy. Echo demonstrated normal LV function. As her arrhythmia occurred in the setting of acute illness and her current debilitated state was felt to be significant, anticoagulation was not recommended. Outpatient event monitor was arranged. This demonstrated recurrent AF with RVR with HR in the 160s.  CHADS2-VASc= 2. We placed her on Eliquis for anticoagulation. We increased her Cardizem for rate control. She continued to have paroxysms of rapid A. fib and we eventually placed her on flecainide 50 mg twice a day.  Follow-up ETT was negative for pro-arrhythmia   I was recently contacted by her oncologist. She is enrolled in a research protocol at [facility] in 11/16 suggested reduced LV function with an EF of 40-45%. Of note, there was poor image quality with significant breathing artifact. There was also suggestion of pulmonary hypertension. Patient is being set up for repeat limited echo to reassess her LV function. This is not performed.  She returns for follow-up. Here with her daughter and friend. Her family notes that she has been short of breath with exertion. The patient  tells me this has been ongoing since she was admitted to the hospital in 9/16. It is no better, no worse. She denies chest discomfort. She denies orthopnea, PND. She denies edema. She has an occasional nonproductive cough. She denies wheezing. She denies melena, hematochezia, hematuria.   Past Medical History  Diagnosis Date  . DJD (degenerative joint disease)   . Hypothyroid   . Cervical spondylosis without myelopathy 10/25/2013  . Dental bridge present   . Follicular lymphoma grade 3a ([place]) 02/03/2015  . Pneumonia   . Anemia     "years ago"  . Nausea without vomiting 06/20/2015  . Elevated troponin 02/18/2015  . Thrush of mouth and esophagus ([place]) 06/25/2015  . Atrial fibrillation with rapid ventricular response ([place]) 06/25/2015  . Stage III chronic kidney disease 07/01/2015    Past Surgical History  Procedure Laterality Date  . Patelar tendon transplants      Left/right  . Abdominal hysterectomy    . Dilation and curettage of uterus    . Total knee arthroplasty  2011    Right  . Appendectomy    . Ovarian cyst resection    . Back surgery      X5-lumbar-fusion  . Lymph node biopsy Right 01/21/2015    Procedure: RIGHT GROIN LYMPH NODE BIOPSY;  Surgeon: Erroll Luna, MD;  Location: [facility];  Service: General;  Laterality: Right;  . Colonoscopy    . Portacath placement Right 02/13/2015    Procedure: INSERTION PORT-A-CATH WITH ULTRASOUND;  Surgeon: Erroll Luna, MD;  Location: [facility];  Service: General;  Laterality: Right;    Current Outpatient Prescriptions  Medication Sig Dispense Refill  . acetaminophen (TYLENOL) 325 MG tablet Take 650 mg by mouth every 6 (six) hours as needed.    Marland Kitchen apixaban (ELIQUIS) 5 MG TABS tablet Take 1 tablet (5 mg total) by mouth 2 (two) times daily. 60 tablet 5  . calcium carbonate (TUMS) 500 MG chewable tablet Chew 2 tablets (400 mg of elemental calcium total) by mouth daily. (Patient taking differently: Chew 2 tablets by mouth daily as  needed for heartburn. ) 2 tablet 0  . diltiazem (CARDIZEM CD) 240 MG 24 hr capsule Take 1 capsule (240 mg total) by mouth daily. 30 capsule 5  . flecainide (TAMBOCOR) 50 MG tablet Take 1 tablet (50 mg total) by mouth 2 (two) times daily. 60 tablet 3  . levothyroxine (SYNTHROID, LEVOTHROID) 100 MCG tablet Take 100 mcg by mouth daily.  9  . lidocaine-prilocaine (EMLA) cream Apply 1 application topically as needed. Apply to port a cath site one hour prior to needle stick 30 g 2  . loratadine (CLARITIN) 10 MG tablet Take 10 mg by mouth daily as needed.     . ondansetron (ZOFRAN ODT) 8 MG disintegrating tablet Take 1 tablet (8 mg total) by mouth every 8 (eight) hours as needed for nausea or vomiting. 30 tablet 0  . prochlorperazine (COMPAZINE) 10 MG tablet Take 1 tablet by mouth every 6 (six) hours as needed. Nausea/vomiting.  6   No current facility-administered medications for this visit.   Facility-Administered Medications Ordered in Other Visits  Medication Dose Route Frequency Provider Last Rate Last Dose  . ondansetron (ZOFRAN) 8 mg in sodium chloride 0.9 % 50 mL IVPB   Intravenous Once [name], MD        Allergies:   Review of patient's allergies indicates no known allergies.   Social History   Social History  . Marital Status: Widowed    Spouse Name: N/A  . Number of Children: 2  . Years of Education: hs   Occupational History  . Retired    Social History Main Topics  . Smoking status: Never Smoker   . Smokeless tobacco: Never Used  . Alcohol Use: Yes     Comment: occassional wine  . Drug Use: No  . Sexual Activity: Not Asked   Other Topics Concern  . None   Social History Narrative   Lives alone.  Has a walker for home use.     Family History:  The patient's family history includes Cancer in her mother and sister.   ROS:   Please see the history of present illness.    ROS All other systems reviewed and are negative.   PHYSICAL EXAM:   VS:  BP 110/72 mmHg   Pulse 66  Ht 5\' 1"  (1.549 m)  Wt 142 lb 3.2 oz (64.501 kg)  BMI 26.88 kg/m2  SpO2 96%   GEN: Well nourished, well developed, in no acute distress HEENT: normal Neck: no JVD, no masses Cardiac: Normal [id number], RRR; 1/6 systolic murmur along the LSB, rubs, or gallops, no edema;     Respiratory:  clear to auscultation bilaterally; no wheezing, rhonchi or rales GI: soft, nontender, nondistended, + BS MS: no deformity or atrophy Skin: warm and dry, no rash Neuro:  Bilateral strength equal, no focal deficits  Psych: Alert and oriented x 3, normal affect  Wt Readings from Last 3 Encounters:  10/14/15 142 lb 3.2 oz (64.501 kg)  09/08/15  136 lb 11.2 oz (62.007 kg)  08/05/15 137 lb 3.2 oz (62.234 kg)      Studies/Labs Reviewed:   EKG:  EKG is  ordered today.  The ekg ordered today demonstrates NSR, HR 66, normal axis, poor R-wave progression, QTc 446 ms, no change from prior tracing  Recent Labs: 06/18/2015: Magnesium 2.1 06/25/2015: TSH 0.851 09/08/2015: ALT 12 09/19/2015: BUN 31*; Creat 1.27*; Hemoglobin 12.6; Platelets 272; Potassium 4.2; Sodium 137   Recent Lipid Panel No results found for: CHOL, TRIG, HDL, CHOLHDL, VLDL, LDLCALC, LDLDIRECT  Additional studies/ records that were reviewed today include:   ETT 08/20/15 Submaximal exercise; no ventricular arrhythmias.  Venous Duplex 9/16 L Leg neg for DVT  Echo 06/26/15 Inferobasal AK, EF 55%, mild AI, mild MR, mild LAE  Echo 4/16 EF 55-60%, normal wall motion, grade 2 diastolic dysfunction, trivial AI, mild MR, moderate LAE, mild TR, trivial PI, PASP 33 mmHg  Myoview 11/10 Normal, EF 66%  Carotid US 6/05 No ICA stenosis   ASSESSMENT:    1. PAF (paroxysmal atrial fibrillation) ([place])   2. LV dysfunction   3. Essential hypertension   4. Shortness of breath     PLAN:  In order of problems listed above:  1. Paroxysmal Atrial Fibrillation: Maintaining NSR.  CHADS2-VASc=2 (age, gender - 3 including HTN). Annual stroke  risk is 2.2 %. Recent Hgb, SCr stable.  Continue Eliquis 5 mg Twice daily.  Continue Flecainide, Diltiazem.  ETT was neg for pro-arrhythmia.     2. LV dysfunction - This was noted on recent MRI done as part of a research protocol with oncology.  Limited echo for FU is pending.    3. HTN: She has a hx of primarily white coat HTN. BP has been controlled at home with SBP in the 120s.  4. Shortness of breath - She notes DOE with mod activities over the past few months without change. Lung exam unremarkable. O2 sats normal.  She denies chest pain.  We did not get her to her target HR at her ETT in 11/16.  With flecainide Rx we should definitively r/o CAD.  She had a recent MRI that suggests LV dysfunction and mild increase in PASP. She also notes fatigue and has been on beta-blocker therapy for the last several mos.   -  Arrange Lexiscan Myoview to r/o ischemic heart disease  -  Stop Toprol-XL to see if this helps her fatigue  -  Limited echo to be done tomorrow.  If EF down, she will need further evaluation and tx (will need to stop CCB, etc)  -  If PASP elevated and other tests normal, consider referral to Pulmonology.  -  Check BMET, BNP today.    Medication Adjustments/Labs and Tests Ordered: Current medicines are reviewed at length with the patient today.  Concerns regarding medicines are outlined above.  Medication changes, Labs and Tests ordered today are listed below. Patient Instructions  Medication Instructions:  Your physician has recommended you make the following change in your medication:  1. Discontinue Metoprolol   Labwork: Your physician recommends that you have lab work today: bmet/bnp  Testing/Procedures: Your physician has requested that you have a lexiscan myoview. For further information please visit [email or web address]. Please follow instruction sheet, as given.  Follow-Up: Your physician wants you to follow-up in: 6 months with Dr. Burt Knack.   You will receive a  reminder letter in the mail two months in advance. If you don't receive a letter, please call our office to  schedule the follow-up appointment.  Any Other Special Instructions Will Be Listed Below (If Applicable).  If you need a refill on your cardiac medications before your next appointment, please call your pharmacy.    Signed, [name], PA-C  10/14/2015 4:31 PM    [facility] [address], [place], [place]  [address] Phone: [phone]; Fax: [phone]

## 2015-10-14 ENCOUNTER — Ambulatory Visit (INDEPENDENT_AMBULATORY_CARE_PROVIDER_SITE_OTHER): Payer: Medicare HMO | Admitting: Physician Assistant

## 2015-10-14 ENCOUNTER — Encounter: Payer: Self-pay | Admitting: Physician Assistant

## 2015-10-14 VITALS — BP 110/72 | HR 66 | Ht 61.0 in | Wt 142.2 lb

## 2015-10-14 DIAGNOSIS — I10 Essential (primary) hypertension: Secondary | ICD-10-CM

## 2015-10-14 DIAGNOSIS — R0602 Shortness of breath: Secondary | ICD-10-CM

## 2015-10-14 DIAGNOSIS — I480 Paroxysmal atrial fibrillation: Secondary | ICD-10-CM

## 2015-10-14 DIAGNOSIS — I519 Heart disease, unspecified: Secondary | ICD-10-CM

## 2015-10-14 DIAGNOSIS — I4891 Unspecified atrial fibrillation: Secondary | ICD-10-CM | POA: Diagnosis not present

## 2015-10-14 LAB — BASIC METABOLIC PANEL
BUN: 32 mg/dL — ABNORMAL HIGH (ref 7–25)
CO2: 27 mmol/L (ref 20–31)
Calcium: 9.4 mg/dL (ref 8.6–10.4)
Chloride: 103 mmol/L (ref 98–110)
Creat: 1.38 mg/dL — ABNORMAL HIGH (ref 0.60–0.93)
Glucose, Bld: 112 mg/dL — ABNORMAL HIGH (ref 65–99)
Potassium: 4.3 mmol/L (ref 3.5–5.3)
Sodium: 138 mmol/L (ref 135–146)

## 2015-10-14 NOTE — Patient Instructions (Addendum)
Medication Instructions:  Your physician has recommended you make the following change in your medication:  1. Discontinue Metoprolol   Labwork: Your physician recommends that you have lab work today: bmet/bnp  Testing/Procedures: Your physician has requested that you have a lexiscan myoview. For further information please visit [email or web address]. Please follow instruction sheet, as given.  Follow-Up: Your physician wants you to follow-up in: 6 months with Dr. Burt Knack.   You will receive a reminder letter in the mail two months in advance. If you don't receive a letter, please call our office to schedule the follow-up appointment.  Any Other Special Instructions Will Be Listed Below (If Applicable).  If you need a refill on your cardiac medications before your next appointment, please call your pharmacy.

## 2015-10-15 ENCOUNTER — Other Ambulatory Visit: Payer: Self-pay

## 2015-10-15 ENCOUNTER — Encounter: Payer: Self-pay | Admitting: Physician Assistant

## 2015-10-15 ENCOUNTER — Ambulatory Visit (HOSPITAL_COMMUNITY): Payer: Medicare HMO | Attending: Cardiovascular Disease

## 2015-10-15 ENCOUNTER — Telehealth: Payer: Self-pay | Admitting: *Deleted

## 2015-10-15 DIAGNOSIS — Z09 Encounter for follow-up examination after completed treatment for conditions other than malignant neoplasm: Secondary | ICD-10-CM | POA: Insufficient documentation

## 2015-10-15 DIAGNOSIS — T451X5D Adverse effect of antineoplastic and immunosuppressive drugs, subsequent encounter: Secondary | ICD-10-CM | POA: Diagnosis not present

## 2015-10-15 DIAGNOSIS — I482 Chronic atrial fibrillation, unspecified: Secondary | ICD-10-CM

## 2015-10-15 DIAGNOSIS — I10 Essential (primary) hypertension: Secondary | ICD-10-CM

## 2015-10-15 DIAGNOSIS — I517 Cardiomegaly: Secondary | ICD-10-CM | POA: Insufficient documentation

## 2015-10-15 DIAGNOSIS — I519 Heart disease, unspecified: Secondary | ICD-10-CM | POA: Diagnosis not present

## 2015-10-15 LAB — BRAIN NATRIURETIC PEPTIDE: Brain Natriuretic Peptide: 181.3 pg/mL — ABNORMAL HIGH (ref 0.0–100.0)

## 2015-10-15 MED ORDER — FUROSEMIDE 20 MG PO TABS: 20.0000 mg | ORAL_TABLET | ORAL | Status: DC

## 2015-10-15 NOTE — Telephone Encounter (Signed)
Pt notified of echo and lab results and findings by phone. Pt agreeable to start lasix 20 mg on Mon, Wed and Fri's; she will have BMET 1/4 when she comes in for her myoview.

## 2015-10-16 ENCOUNTER — Telehealth (HOSPITAL_COMMUNITY): Payer: Self-pay | Admitting: *Deleted

## 2015-10-16 NOTE — Telephone Encounter (Signed)
Patient given detailed instructions per Myocardial Perfusion Study Information Sheet for the test on 10/21/14 at 0745. Patient notified to arrive 15 minutes early and that it is imperative to arrive on time for appointment to keep from having the test rescheduled.  If you need to cancel or reschedule your appointment, please call the office within 24 hours of your appointment. Failure to do so may result in a cancellation of your appointment, and a $50 no show fee. Patient verbalized understanding.Atanacio Melnyk, Ranae [place]

## 2015-10-17 ENCOUNTER — Encounter: Payer: Self-pay | Admitting: Cardiology

## 2015-10-21 ENCOUNTER — Ambulatory Visit: Payer: Medicare HMO | Admitting: Physician Assistant

## 2015-10-22 ENCOUNTER — Other Ambulatory Visit (INDEPENDENT_AMBULATORY_CARE_PROVIDER_SITE_OTHER): Payer: Medicare HMO | Admitting: *Deleted

## 2015-10-22 ENCOUNTER — Ambulatory Visit (HOSPITAL_COMMUNITY): Payer: Medicare HMO | Attending: Cardiology

## 2015-10-22 ENCOUNTER — Encounter: Payer: Self-pay | Admitting: Physician Assistant

## 2015-10-22 DIAGNOSIS — I10 Essential (primary) hypertension: Secondary | ICD-10-CM

## 2015-10-22 DIAGNOSIS — I482 Chronic atrial fibrillation, unspecified: Secondary | ICD-10-CM

## 2015-10-22 DIAGNOSIS — I4891 Unspecified atrial fibrillation: Secondary | ICD-10-CM | POA: Diagnosis not present

## 2015-10-22 DIAGNOSIS — R0609 Other forms of dyspnea: Secondary | ICD-10-CM | POA: Insufficient documentation

## 2015-10-22 DIAGNOSIS — I480 Paroxysmal atrial fibrillation: Secondary | ICD-10-CM

## 2015-10-22 DIAGNOSIS — I519 Heart disease, unspecified: Secondary | ICD-10-CM | POA: Diagnosis not present

## 2015-10-22 DIAGNOSIS — R0602 Shortness of breath: Secondary | ICD-10-CM

## 2015-10-22 LAB — MYOCARDIAL PERFUSION IMAGING
LV dias vol: 89 mL
LV sys vol: 40 mL
Peak HR: 86 {beats}/min
RATE: 0.3
Rest HR: 63 {beats}/min
SDS: 0
SRS: 4
SSS: 4
TID: 0.97

## 2015-10-22 LAB — BASIC METABOLIC PANEL
BUN: 24 mg/dL (ref 7–25)
CO2: 26 mmol/L (ref 20–31)
Calcium: 9.1 mg/dL (ref 8.6–10.4)
Chloride: 107 mmol/L (ref 98–110)
Creat: 1.26 mg/dL — ABNORMAL HIGH (ref 0.60–0.93)
Glucose, Bld: 120 mg/dL — ABNORMAL HIGH (ref 65–99)
Potassium: 3.9 mmol/L (ref 3.5–5.3)
Sodium: 142 mmol/L (ref 135–146)

## 2015-10-22 MED ORDER — TECHNETIUM TC 99M SESTAMIBI GENERIC - CARDIOLITE
32.8000 | Freq: Once | INTRAVENOUS | Status: AC | PRN
  Administered 2015-10-22: 32.8 via INTRAVENOUS

## 2015-10-22 MED ORDER — TECHNETIUM TC 99M SESTAMIBI GENERIC - CARDIOLITE
10.7000 | Freq: Once | INTRAVENOUS | Status: AC | PRN
  Administered 2015-10-22: 11 via INTRAVENOUS

## 2015-10-22 MED ORDER — REGADENOSON 0.4 MG/5ML IV SOLN
0.4000 mg | Freq: Once | INTRAVENOUS | Status: AC
  Administered 2015-10-22: 0.4 mg via INTRAVENOUS

## 2015-10-23 ENCOUNTER — Telehealth: Payer: Self-pay | Admitting: *Deleted

## 2015-10-23 NOTE — Telephone Encounter (Signed)
Lmptcb x 2 

## 2015-10-23 NOTE — Telephone Encounter (Signed)
Ptcb and has been notified of lab and myoview results with verbal understanding by phone.

## 2015-10-23 NOTE — Telephone Encounter (Signed)
Lmptcb to go over lab and myoview results

## 2015-10-23 NOTE — Telephone Encounter (Signed)
Follow up ° ° ° ° ° °Returning a call to the nurse °

## 2015-11-03 ENCOUNTER — Ambulatory Visit (HOSPITAL_BASED_OUTPATIENT_CLINIC_OR_DEPARTMENT_OTHER): Payer: Medicare HMO | Admitting: Hematology and Oncology

## 2015-11-03 ENCOUNTER — Ambulatory Visit (HOSPITAL_BASED_OUTPATIENT_CLINIC_OR_DEPARTMENT_OTHER): Payer: Medicare HMO

## 2015-11-03 ENCOUNTER — Ambulatory Visit: Payer: Medicare HMO

## 2015-11-03 ENCOUNTER — Other Ambulatory Visit (HOSPITAL_BASED_OUTPATIENT_CLINIC_OR_DEPARTMENT_OTHER): Payer: Medicare HMO

## 2015-11-03 ENCOUNTER — Encounter: Payer: Self-pay | Admitting: Hematology and Oncology

## 2015-11-03 VITALS — BP 151/86 | HR 80 | Temp 97.6°F | Resp 18 | Ht 61.0 in | Wt 141.7 lb

## 2015-11-03 VITALS — BP 155/77 | HR 73 | Temp 96.9°F | Resp 20

## 2015-11-03 DIAGNOSIS — I482 Chronic atrial fibrillation, unspecified: Secondary | ICD-10-CM

## 2015-11-03 DIAGNOSIS — R21 Rash and other nonspecific skin eruption: Secondary | ICD-10-CM | POA: Diagnosis not present

## 2015-11-03 DIAGNOSIS — C8238 Follicular lymphoma grade IIIa, lymph nodes of multiple sites: Secondary | ICD-10-CM

## 2015-11-03 DIAGNOSIS — Z5112 Encounter for antineoplastic immunotherapy: Secondary | ICD-10-CM | POA: Diagnosis not present

## 2015-11-03 DIAGNOSIS — C8230 Follicular lymphoma grade IIIa, unspecified site: Secondary | ICD-10-CM

## 2015-11-03 DIAGNOSIS — Z95828 Presence of other vascular implants and grafts: Secondary | ICD-10-CM

## 2015-11-03 LAB — COMPREHENSIVE METABOLIC PANEL
ALT: 15 U/L (ref 0–55)
AST: 17 U/L (ref 5–34)
Albumin: 3.8 g/dL (ref 3.5–5.0)
Alkaline Phosphatase: 72 U/L (ref 40–150)
Anion Gap: 7 mEq/L (ref 3–11)
BUN: 21.5 mg/dL (ref 7.0–26.0)
CO2: 28 mEq/L (ref 22–29)
Calcium: 9.5 mg/dL (ref 8.4–10.4)
Chloride: 107 mEq/L (ref 98–109)
Creatinine: 1.1 mg/dL (ref 0.6–1.1)
EGFR: 48 mL/min/{1.73_m2} — ABNORMAL LOW (ref >90)
Glucose: 88 mg/dl (ref 70–140)
Potassium: 4.2 mEq/L (ref 3.5–5.1)
Sodium: 142 mEq/L (ref 136–145)
Total Bilirubin: 0.41 mg/dL (ref 0.20–1.20)
Total Protein: 6.8 g/dL (ref 6.4–8.3)

## 2015-11-03 LAB — CBC WITH DIFFERENTIAL/PLATELET
BASO%: 0.6 % (ref 0.0–2.0)
Basophils Absolute: 0 10*3/uL (ref 0.0–0.1)
EOS%: 2 % (ref 0.0–7.0)
Eosinophils Absolute: 0.1 10*3/uL (ref 0.0–0.5)
HCT: 40.6 % (ref 34.8–46.6)
HGB: 13.3 g/dL (ref 11.6–15.9)
LYMPH%: 11.8 % — ABNORMAL LOW (ref 14.0–49.7)
MCH: 28.5 pg (ref 25.1–34.0)
MCHC: 32.8 g/dL (ref 31.5–36.0)
MCV: 86.8 fL (ref 79.5–101.0)
MONO#: 0.7 10*3/uL (ref 0.1–0.9)
MONO%: 10.6 % (ref 0.0–14.0)
NEUT#: 5.1 10*3/uL (ref 1.5–6.5)
NEUT%: 75 % (ref 38.4–76.8)
Platelets: 240 10*3/uL (ref 145–400)
RBC: 4.68 10*6/uL (ref 3.70–5.45)
RDW: 16.7 % — ABNORMAL HIGH (ref 11.2–14.5)
WBC: 6.7 10*3/uL (ref 3.9–10.3)
lymph#: 0.8 10*3/uL — ABNORMAL LOW (ref 0.9–3.3)

## 2015-11-03 LAB — LACTATE DEHYDROGENASE: LDH: 195 U/L (ref 125–245)

## 2015-11-03 MED ORDER — HEPARIN SOD (PORK) LOCK FLUSH 100 UNIT/ML IV SOLN
500.0000 [IU] | Freq: Once | INTRAVENOUS | Status: AC | PRN
Start: 2015-11-03 — End: 2015-11-03
  Administered 2015-11-03: 500 [IU]
  Filled 2015-11-03: qty 5

## 2015-11-03 MED ORDER — DIPHENHYDRAMINE HCL 25 MG PO CAPS
50.0000 mg | ORAL_CAPSULE | Freq: Once | ORAL | Status: AC
  Administered 2015-11-03: 50 mg via ORAL

## 2015-11-03 MED ORDER — SODIUM CHLORIDE 0.9 % IV SOLN
Freq: Once | INTRAVENOUS | Status: AC
  Administered 2015-11-03: 11:00:00 via INTRAVENOUS

## 2015-11-03 MED ORDER — ACETAMINOPHEN 325 MG PO TABS
ORAL_TABLET | ORAL | Status: AC
  Filled 2015-11-03: qty 2

## 2015-11-03 MED ORDER — SODIUM CHLORIDE 0.9 % IJ SOLN
10.0000 mL | INTRAMUSCULAR | Status: DC | PRN
Start: 2015-11-03 — End: 2015-11-03
  Administered 2015-11-03: 10 mL via INTRAVENOUS
  Filled 2015-11-03: qty 10

## 2015-11-03 MED ORDER — ACETAMINOPHEN 325 MG PO TABS
650.0000 mg | ORAL_TABLET | Freq: Once | ORAL | Status: AC
  Administered 2015-11-03: 650 mg via ORAL

## 2015-11-03 MED ORDER — RITUXIMAB CHEMO INJECTION 500 MG/50ML
375.0000 mg/m2 | Freq: Once | INTRAVENOUS | Status: AC
  Administered 2015-11-03: 600 mg via INTRAVENOUS
  Filled 2015-11-03: qty 50

## 2015-11-03 MED ORDER — SODIUM CHLORIDE 0.9 % IJ SOLN
10.0000 mL | INTRAMUSCULAR | Status: DC | PRN
  Administered 2015-11-03: 10 mL
  Filled 2015-11-03: qty 10

## 2015-11-03 MED ORDER — DIPHENHYDRAMINE HCL 25 MG PO CAPS
ORAL_CAPSULE | ORAL | Status: AC
  Filled 2015-11-03: qty 2

## 2015-11-03 NOTE — Progress Notes (Signed)
[facility] OFFICE PROGRESS NOTE  Patient Care Team: Arvella Nigh, MD as PCP - General (Obstetrics and Gynecology) Carola Frost, RN as Registered Nurse (Medical Oncology) [name] as Attending Physician (Internal Medicine)  SUMMARY OF ONCOLOGIC HISTORY:   Follicular lymphoma grade 3a ([facility] [place])   01/21/2015 Surgery She underwent excisional lymph node biopsy that came back follicular lymphoma grade 3   01/21/2015 Pathology Results Accession: [id number] biopsy confirmed follicular lymphoma   01/24/8118 Imaging Echocardiogram showed ejection fraction of 55-60%   02/11/2015 Imaging  PET CT scan show possible splenic involvement and diffuse lymphadenopathy throughout   02/11/2015 Bone Marrow Biopsy  bone marrow biopsy was performed and is involved by lymphoma with translocation of igH/BCL2   02/13/2015 Procedure She had port placement.   02/17/2015 - 06/02/2015 Chemotherapy She received R-CHOP chemo x 6   02/17/2015 Adverse Reaction She had mild infusion reaction with cycle 1 of treatment.   04/18/2015 Imaging PET CT scan showed near complete response to treatment.   04/22/2015 Adverse Reaction Vincristine dose was reduced by 50% due to neuropathy from cycle 4 onwards   06/25/2015 - 07/06/2015 Hospital Admission She was hospitalized for recent sepsis/bacteremia and a fib with RVR   07/11/2015 Imaging repeat PEt scan showed complete response to Rx    INTERVAL HISTORY: Please see below for problem oriented charting. She returns today prior to cycle 3 of rituximab. She have a lot of question pertaining to her hospitalization 4 months ago. She continues to have intermittent sweating, skin rashes but have good appetite and denies recent weight loss. Denies any chest pain or shortness of breath. No new lymphadenopathy.  REVIEW OF SYSTEMS:   Constitutional: Denies fevers, chills or abnormal weight loss Eyes: Denies blurriness of vision Ears, nose, mouth, throat, and face: Denies mucositis or  sore throat Respiratory: Denies cough, dyspnea or wheezes Cardiovascular: Denies palpitation, chest discomfort or lower extremity swelling Gastrointestinal:  Denies nausea, heartburn or change in bowel habits Lymphatics: Denies new lymphadenopathy or easy bruising Neurological:Denies numbness, tingling or new weaknesses Behavioral/Psych: Mood is stable, no new changes  All other systems were reviewed with the patient and are negative.  I have reviewed the past medical history, past surgical history, social history and family history with the patient and they are unchanged from previous note.  ALLERGIES:  has No Known Allergies.  MEDICATIONS:  Current Outpatient Prescriptions  Medication Sig Dispense Refill  . acetaminophen (TYLENOL) 325 MG tablet Take 650 mg by mouth every 6 (six) hours as needed.    Marland Kitchen apixaban (ELIQUIS) 5 MG TABS tablet Take 1 tablet (5 mg total) by mouth 2 (two) times daily. 60 tablet 5  . calcium carbonate (TUMS) 500 MG chewable tablet Chew 2 tablets (400 mg of elemental calcium total) by mouth daily. (Patient taking differently: Chew 2 tablets by mouth daily as needed for heartburn. ) 2 tablet 0  . diltiazem (CARDIZEM CD) 240 MG 24 hr capsule Take 1 capsule (240 mg total) by mouth daily. 30 capsule 5  . flecainide (TAMBOCOR) 50 MG tablet Take 1 tablet (50 mg total) by mouth 2 (two) times daily. 60 tablet 3  . furosemide (LASIX) 20 MG tablet Take 1 tablet (20 mg total) by mouth every Monday, Wednesday, and Friday. 30 tablet 11  . levothyroxine (SYNTHROID, LEVOTHROID) 100 MCG tablet Take 100 mcg by mouth daily.  9  . lidocaine-prilocaine (EMLA) cream Apply 1 application topically as needed. Apply to port a cath site one hour prior to needle  stick 30 g 2  . loratadine (CLARITIN) 10 MG tablet Take 10 mg by mouth daily as needed.      No current facility-administered medications for this visit.   Facility-Administered Medications Ordered in Other Visits  Medication Dose  Route Frequency Provider Last Rate Last Dose  . heparin lock flush 100 unit/mL  500 Units Intracatheter Once PRN [name], MD      . ondansetron (ZOFRAN) 8 mg in sodium chloride 0.9 % 50 mL IVPB   Intravenous Once [name], MD      . sodium chloride 0.9 % injection 10 mL  10 mL Intracatheter PRN [name], MD        PHYSICAL EXAMINATION: ECOG PERFORMANCE STATUS: 1 - Symptomatic but completely ambulatory  Filed Vitals:   11/03/15 1019  BP: 151/86  Pulse: 80  Temp: 97.6 F (36.4 C)  Resp: 18   Filed Weights   11/03/15 1019  Weight: 141 lb 11.2 oz (64.275 kg)    GENERAL:alert, no distress and comfortable SKIN: Noted mild skin rashes on both lower extremities. Dry skin noted EYES: normal, Conjunctiva are pink and non-injected, sclera clear OROPHARYNX:no exudate, no erythema and lips, buccal mucosa, and tongue normal  NECK: supple, thyroid normal size, non-tender, without nodularity LYMPH:  no palpable lymphadenopathy in the cervical, axillary or inguinal LUNGS: clear to auscultation and percussion with normal breathing effort HEART: regular rate & rhythm and no murmurs and no lower extremity edema ABDOMEN:abdomen soft, non-tender and normal bowel sounds Musculoskeletal:no cyanosis of digits and no clubbing  NEURO: alert & oriented x 3 with fluent speech, no focal motor/sensory deficits  LABORATORY DATA:  I have reviewed the data as listed    Component Value Date/Time   NA 142 11/03/2015 0940   NA 142 10/22/2015 0744   K 4.2 11/03/2015 0940   K 3.9 10/22/2015 0744   CL 107 10/22/2015 0744   CO2 28 11/03/2015 0940   CO2 26 10/22/2015 0744   GLUCOSE 88 11/03/2015 0940   GLUCOSE 120* 10/22/2015 0744   BUN 21.5 11/03/2015 0940   BUN 24 10/22/2015 0744   CREATININE 1.1 11/03/2015 0940   CREATININE 1.26* 10/22/2015 0744   CREATININE 1.14* 07/04/2015 0537   CALCIUM 9.5 11/03/2015 0940   CALCIUM 9.1 10/22/2015 0744   PROT 6.8 11/03/2015 0940   PROT 6.2* 06/25/2015 1215    ALBUMIN 3.8 11/03/2015 0940   ALBUMIN 2.8* 06/25/2015 1215   AST 17 11/03/2015 0940   AST 157* 06/25/2015 1215   ALT 15 11/03/2015 0940   ALT 106* 06/25/2015 1215   ALKPHOS 72 11/03/2015 0940   ALKPHOS 106 06/25/2015 1215   BILITOT 0.41 11/03/2015 0940   BILITOT 0.7 06/25/2015 1215   GFRNONAA 47* 07/04/2015 0537   GFRAA 54* 07/04/2015 0537    No results found for: SPEP, UPEP  Lab Results  Component Value Date   WBC 6.7 11/03/2015   NEUTROABS 5.1 11/03/2015   HGB 13.3 11/03/2015   HCT 40.6 11/03/2015   MCV 86.8 11/03/2015   PLT 240 11/03/2015      Chemistry      Component Value Date/Time   NA 142 11/03/2015 0940   NA 142 10/22/2015 0744   K 4.2 11/03/2015 0940   K 3.9 10/22/2015 0744   CL 107 10/22/2015 0744   CO2 28 11/03/2015 0940   CO2 26 10/22/2015 0744   BUN 21.5 11/03/2015 0940   BUN 24 10/22/2015 0744   CREATININE 1.1 11/03/2015 0940   CREATININE  1.26* 10/22/2015 0744   CREATININE 1.14* 07/04/2015 0537      Component Value Date/Time   CALCIUM 9.5 11/03/2015 0940   CALCIUM 9.1 10/22/2015 0744   ALKPHOS 72 11/03/2015 0940   ALKPHOS 106 06/25/2015 1215   AST 17 11/03/2015 0940   AST 157* 06/25/2015 1215   ALT 15 11/03/2015 0940   ALT 106* 06/25/2015 1215   BILITOT 0.41 11/03/2015 0940   BILITOT 0.7 06/25/2015 1215      ASSESSMENT & PLAN:  Grade 3a follicular lymphoma of lymph nodes of multiple regions [facility]) She feels well PET CT scan from September 2016 showed complete response to treatment. We discussed the role of maintenance rituximab every other month for 2 years and she agreed to proceed Her next imaging study would be done in March 2017.    Skin rash She had mild changes on her skin associated with dryness. This is not related to drug reaction. I recommend observation only.  Chronic atrial fibrillation (HCC)  She is on multiple different medications for rate control for atrial fibrillation. She complained of occasional skin itching  and intermittent hot flashes. Examination did not suggest cancer recurrence. It is not clear to me whether her symptoms could be related to some of her medications that she take for atrial fibrillation. I recommend conservative management. She has no bleeding complications from anticoagulation therapy   Orders Placed This Encounter  Procedures  . NM PET Image Restag (PS) Skull Base To Thigh    Standing Status: Future     Number of Occurrences:      Standing Expiration Date: 01/02/2017    Order Specific Question:  Reason for Exam (SYMPTOM  OR DIAGNOSIS REQUIRED)    Answer:  staging lymphoma, assess response to Rx    Order Specific Question:  Preferred imaging location?    Answer:  [facility]  . CBC with Differential/Platelet    Standing Status: Future     Number of Occurrences:      Standing Expiration Date: 12/07/2016  . Comprehensive metabolic panel    Standing Status: Future     Number of Occurrences:      Standing Expiration Date: 12/07/2016   All questions were answered. The patient knows to call the clinic with any problems, questions or concerns. No barriers to learning was detected. I spent 20 minutes counseling the patient face to face. The total time spent in the appointment was 25 minutes and more than 50% was on counseling and review of test results     [facility], [place], MD 11/03/2015 12:04 PM

## 2015-11-03 NOTE — Assessment & Plan Note (Signed)
She is on multiple different medications for rate control for atrial fibrillation. She complained of occasional skin itching and intermittent hot flashes. Examination did not suggest cancer recurrence. It is not clear to me whether her symptoms could be related to some of her medications that she take for atrial fibrillation. I recommend conservative management. She has no bleeding complications from anticoagulation therapy

## 2015-11-03 NOTE — Assessment & Plan Note (Signed)
She had mild changes on her skin associated with dryness. This is not related to drug reaction. I recommend observation only.

## 2015-11-03 NOTE — Patient Instructions (Signed)
[facility] Discharge Instructions for Patients Receiving Chemotherapy  Today you received the following chemotherapy agents:  Rituxan.  To help prevent nausea and vomiting after your treatment, we encourage you to take your nausea medication as ordered per MD.   If you develop nausea and vomiting that is not controlled by your nausea medication, call the clinic.   BELOW ARE SYMPTOMS THAT SHOULD BE REPORTED IMMEDIATELY:  *FEVER GREATER THAN 100.5 F  *CHILLS WITH OR WITHOUT FEVER  NAUSEA AND VOMITING THAT IS NOT CONTROLLED WITH YOUR NAUSEA MEDICATION  *UNUSUAL SHORTNESS OF BREATH  *UNUSUAL BRUISING OR BLEEDING  TENDERNESS IN MOUTH AND THROAT WITH OR WITHOUT PRESENCE OF ULCERS  *URINARY PROBLEMS  *BOWEL PROBLEMS  UNUSUAL RASH Items with * indicate a potential emergency and should be followed up as soon as possible.  Feel free to call the clinic you have any questions or concerns. The clinic phone number is [phone].  Please show the CHEMO ALERT CARD at check-in to the Emergency Department and triage nurse.   

## 2015-11-03 NOTE — Assessment & Plan Note (Signed)
She feels well PET CT scan from September 2016 showed complete response to treatment. We discussed the role of maintenance rituximab every other month for 2 years and she agreed to proceed Her next imaging study would be done in March 2017.

## 2015-11-05 ENCOUNTER — Telehealth: Payer: Self-pay | Admitting: Hematology and Oncology

## 2015-11-05 NOTE — Telephone Encounter (Signed)
Spoke with patient re appointments for 3/16 and 3/17. [facility] will call re pet scan appointment - patient aware.

## 2015-11-17 DIAGNOSIS — M25861 Other specified joint disorders, right knee: Secondary | ICD-10-CM | POA: Diagnosis not present

## 2015-11-17 DIAGNOSIS — M25561 Pain in right knee: Secondary | ICD-10-CM | POA: Diagnosis not present

## 2015-11-17 DIAGNOSIS — Z96651 Presence of right artificial knee joint: Secondary | ICD-10-CM | POA: Diagnosis not present

## 2015-11-17 DIAGNOSIS — Z471 Aftercare following joint replacement surgery: Secondary | ICD-10-CM | POA: Diagnosis not present

## 2015-11-26 DIAGNOSIS — M25561 Pain in right knee: Secondary | ICD-10-CM | POA: Diagnosis not present

## 2015-12-01 DIAGNOSIS — M25561 Pain in right knee: Secondary | ICD-10-CM | POA: Diagnosis not present

## 2015-12-03 DIAGNOSIS — M25561 Pain in right knee: Secondary | ICD-10-CM | POA: Diagnosis not present

## 2015-12-05 ENCOUNTER — Other Ambulatory Visit: Payer: Self-pay | Admitting: Hematology and Oncology

## 2015-12-05 ENCOUNTER — Telehealth: Payer: Self-pay | Admitting: *Deleted

## 2015-12-05 ENCOUNTER — Telehealth: Payer: Self-pay | Admitting: Hematology and Oncology

## 2015-12-05 NOTE — Telephone Encounter (Signed)
Pt reports small "pea sized" lump under right arm.  States has felt some pain in right armpit for several days and now feels a small lump in that area.   It is not more painful to touch, just hurts all the time.

## 2015-12-05 NOTE — Telephone Encounter (Signed)
per pof to sch pt appt-cld pt and pt aware

## 2015-12-05 NOTE — Telephone Encounter (Signed)
I placed POF to see her Monday

## 2015-12-05 NOTE — Telephone Encounter (Signed)
Informed pt Dr. Alvy Bimler sent order to Scheduler to see pt on Monday 2/20 at 11:45 am.  She verbalized understanding.

## 2015-12-08 ENCOUNTER — Encounter: Payer: Self-pay | Admitting: Hematology and Oncology

## 2015-12-08 ENCOUNTER — Ambulatory Visit (HOSPITAL_BASED_OUTPATIENT_CLINIC_OR_DEPARTMENT_OTHER): Payer: Medicare HMO | Admitting: Hematology and Oncology

## 2015-12-08 VITALS — BP 144/70 | HR 82 | Temp 97.9°F | Resp 17 | Ht 61.0 in | Wt 144.3 lb

## 2015-12-08 DIAGNOSIS — C8238 Follicular lymphoma grade IIIa, lymph nodes of multiple sites: Secondary | ICD-10-CM | POA: Diagnosis not present

## 2015-12-08 DIAGNOSIS — M25561 Pain in right knee: Secondary | ICD-10-CM | POA: Diagnosis not present

## 2015-12-08 NOTE — Progress Notes (Signed)
[facility] OFFICE PROGRESS NOTE  Patient Care Team: John McComb, MD as PCP - General (Obstetrics and Gynecology) Gina B Dixon, RN as Registered Nurse (Medical Oncology) Manharprett Sekhon as Attending Physician (Internal Medicine)  SUMMARY OF ONCOLOGIC HISTORY:   Follicular lymphoma grade 3a (HCC)   01/21/2015 Surgery She underwent excisional lymph node biopsy that came back follicular lymphoma grade 3   01/21/2015 Pathology Results Accession: [id number] biopsy confirmed follicular lymphoma   02/07/2015 Imaging Echocardiogram showed ejection fraction of 55-60%   02/11/2015 Imaging  PET CT scan show possible splenic involvement and diffuse lymphadenopathy throughout   02/11/2015 Bone Marrow Biopsy  bone marrow biopsy was performed and is involved by lymphoma with translocation of igH/BCL2   02/13/2015 Procedure She had port placement.   02/17/2015 - 06/02/2015 Chemotherapy She received R-CHOP chemo x 6   02/17/2015 Adverse Reaction She had mild infusion reaction with cycle 1 of treatment.   04/18/2015 Imaging PET CT scan showed near complete response to treatment.   04/22/2015 Adverse Reaction Vincristine dose was reduced by 50% due to neuropathy from cycle 4 onwards   06/25/2015 - 07/06/2015 Hospital Admission She was hospitalized for recent sepsis/bacteremia and a fib with RVR   07/11/2015 Imaging repeat PEt scan showed complete response to Rx    INTERVAL HISTORY: Please see below for problem oriented charting. She is seen today because of concerns of bilateral axillary lymphadenopathy. According to the patient, they were present for about a week. The swelling in the right axilla has improved but she still had persistent tenderness and swelling over the left axilla She denies recent skin infection. She complained of skin blotchiness skin on the face, on her legs and complaining of knee pain. She also has some suprapubic discomfort but denies dysuria, frequency or urgency She has  occasional hot flashes Denies drenching night sweats She denies recent infection  REVIEW OF SYSTEMS:   Constitutional: Denies fevers, chills or abnormal weight loss Eyes: Denies blurriness of vision Ears, nose, mouth, throat, and face: Denies mucositis or sore throat Respiratory: Denies cough, dyspnea or wheezes Cardiovascular: Denies palpitation, chest discomfort or lower extremity swelling Gastrointestinal:  Denies nausea, heartburn or change in bowel habits Neurological:Denies numbness, tingling or new weaknesses Behavioral/Psych: Mood is stable, no new changes  All other systems were reviewed with the patient and are negative.  I have reviewed the past medical history, past surgical history, social history and family history with the patient and they are unchanged from previous note.  ALLERGIES:  has No Known Allergies.  MEDICATIONS:  Current Outpatient Prescriptions  Medication Sig Dispense Refill  . acetaminophen (TYLENOL) 325 MG tablet Take 650 mg by mouth every 6 (six) hours as needed.    . apixaban (ELIQUIS) 5 MG TABS tablet Take 1 tablet (5 mg total) by mouth 2 (two) times daily. 60 tablet 5  . calcium carbonate (TUMS) 500 MG chewable tablet Chew 2 tablets (400 mg of elemental calcium total) by mouth daily. (Patient taking differently: Chew 2 tablets by mouth daily as needed for heartburn. ) 2 tablet 0  . diltiazem (CARDIZEM CD) 240 MG 24 hr capsule Take 1 capsule (240 mg total) by mouth daily. 30 capsule 5  . flecainide (TAMBOCOR) 50 MG tablet Take 1 tablet (50 mg total) by mouth 2 (two) times daily. 60 tablet 3  . furosemide (LASIX) 20 MG tablet Take 1 tablet (20 mg total) by mouth every Monday, Wednesday, and Friday. 30 tablet 11  . levothyroxine (SYNTHROID, LEVOTHROID) 100   MCG tablet Take 100 mcg by mouth daily.  9  . loratadine (CLARITIN) 10 MG tablet Take 10 mg by mouth daily as needed.     . lidocaine-prilocaine (EMLA) cream Apply 1 application topically as needed.  Apply to port a cath site one hour prior to needle stick (Patient not taking: Reported on 12/08/2015) 30 g 2   No current facility-administered medications for this visit.   Facility-Administered Medications Ordered in Other Visits  Medication Dose Route Frequency Provider Last Rate Last Dose  . ondansetron (ZOFRAN) 8 mg in sodium chloride 0.9 % 50 mL IVPB   Intravenous Once  , MD        PHYSICAL EXAMINATION: ECOG PERFORMANCE STATUS: 1 - Symptomatic but completely ambulatory  Filed Vitals:   12/08/15 1132  BP: 144/70  Pulse: 82  Temp: 97.9 F (36.6 C)  Resp: 17   Filed Weights   12/08/15 1132  Weight: 144 lb 4.8 oz (65.454 kg)    GENERAL:alert, no distress and comfortable SKIN: She has very mild faint rash on her face, likely rosacea EYES: normal, Conjunctiva are pink and non-injected, sclera clear OROPHARYNX:no exudate, no erythema and lips, buccal mucosa, and tongue normal  NECK: supple, thyroid normal size, non-tender, without nodularity LYMPH:  She has fullness on palpation on both axillary region, very tender on palpation, left greater than the right  LUNGS: clear to auscultation and percussion with normal breathing effort HEART: regular rate & rhythm and no murmurs and no lower extremity edema ABDOMEN:abdomen soft, non-tender and normal bowel sounds Musculoskeletal:no cyanosis of digits and no clubbing  NEURO: alert & oriented x 3 with fluent speech, no focal motor/sensory deficits  LABORATORY DATA:  I have reviewed the data as listed    Component Value Date/Time   NA 142 11/03/2015 0940   NA 142 10/22/2015 0744   K 4.2 11/03/2015 0940   K 3.9 10/22/2015 0744   CL 107 10/22/2015 0744   CO2 28 11/03/2015 0940   CO2 26 10/22/2015 0744   GLUCOSE 88 11/03/2015 0940   GLUCOSE 120* 10/22/2015 0744   BUN 21.5 11/03/2015 0940   BUN 24 10/22/2015 0744   CREATININE 1.1 11/03/2015 0940   CREATININE 1.26* 10/22/2015 0744   CREATININE 1.14* 07/04/2015 0537    CALCIUM 9.5 11/03/2015 0940   CALCIUM 9.1 10/22/2015 0744   PROT 6.8 11/03/2015 0940   PROT 6.2* 06/25/2015 1215   ALBUMIN 3.8 11/03/2015 0940   ALBUMIN 2.8* 06/25/2015 1215   AST 17 11/03/2015 0940   AST 157* 06/25/2015 1215   ALT 15 11/03/2015 0940   ALT 106* 06/25/2015 1215   ALKPHOS 72 11/03/2015 0940   ALKPHOS 106 06/25/2015 1215   BILITOT 0.41 11/03/2015 0940   BILITOT 0.7 06/25/2015 1215   GFRNONAA 47* 07/04/2015 0537   GFRAA 54* 07/04/2015 0537    No results found for: SPEP, UPEP  Lab Results  Component Value Date   WBC 6.7 11/03/2015   NEUTROABS 5.1 11/03/2015   HGB 13.3 11/03/2015   HCT 40.6 11/03/2015   MCV 86.8 11/03/2015   PLT 240 11/03/2015      Chemistry      Component Value Date/Time   NA 142 11/03/2015 0940   NA 142 10/22/2015 0744   K 4.2 11/03/2015 0940   K 3.9 10/22/2015 0744   CL 107 10/22/2015 0744   CO2 28 11/03/2015 0940   CO2 26 10/22/2015 0744   BUN 21.5 11/03/2015 0940   BUN 24 10/22/2015 0744     CREATININE 1.1 11/03/2015 0940   CREATININE 1.26* 10/22/2015 0744   CREATININE 1.14* 07/04/2015 0537      Component Value Date/Time   CALCIUM 9.5 11/03/2015 0940   CALCIUM 9.1 10/22/2015 0744   ALKPHOS 72 11/03/2015 0940   ALKPHOS 106 06/25/2015 1215   AST 17 11/03/2015 0940   AST 157* 06/25/2015 1215   ALT 15 11/03/2015 0940   ALT 106* 06/25/2015 1215   BILITOT 0.41 11/03/2015 0940   BILITOT 0.7 06/25/2015 1215     ASSESSMENT & PLAN:  Follicular lymphoma grade 3a She has new concerns for bilateral lymphadenopathy in the axillary region with tenderness. Examination reveals some fullness and discomfort, left greater than the right. Due to concern for possible lymphoma recurrence, I will order a PET CT scan for further evaluation. I will call the patient with test results. If PET/CT scan showed disease recurrence, we might have to switch her treatment back to chemotherapy In the meantime, for discomfort, I recommend extra strength  Tylenol only   No orders of the defined types were placed in this encounter.   All questions were answered. The patient knows to call the clinic with any problems, questions or concerns. No barriers to learning was detected. I spent 15 minutes counseling the patient face to face. The total time spent in the appointment was 20 minutes and more than 50% was on counseling and review of test results     [facility], [place], MD 12/08/2015 11:52 AM

## 2015-12-08 NOTE — Assessment & Plan Note (Signed)
She has new concerns for bilateral lymphadenopathy in the axillary region with tenderness. Examination reveals some fullness and discomfort, left greater than the right. Due to concern for possible lymphoma recurrence, I will order a PET CT scan for further evaluation. I will call the patient with test results. If PET/CT scan showed disease recurrence, we might have to switch her treatment back to chemotherapy In the meantime, for discomfort, I recommend extra strength Tylenol only

## 2015-12-10 ENCOUNTER — Telehealth: Payer: Self-pay | Admitting: *Deleted

## 2015-12-10 DIAGNOSIS — M25561 Pain in right knee: Secondary | ICD-10-CM | POA: Diagnosis not present

## 2015-12-10 NOTE — Telephone Encounter (Signed)
-----   Message from [name], MD sent at 12/08/2015 11:46 AM EST ----- Regarding: PET scan She has new concerns for recurrence of lymphoma Can we get the scan done sooner? If yes, Parthiv Mucci can you call to have it done ASAP?

## 2015-12-10 NOTE — Telephone Encounter (Signed)
Pt notified of PET scheduled for 2/27 @ 0630 at [facility]. NPO after MN

## 2015-12-11 ENCOUNTER — Telehealth: Payer: Self-pay | Admitting: Hematology and Oncology

## 2015-12-11 DIAGNOSIS — Z6827 Body mass index (BMI) 27.0-27.9, adult: Secondary | ICD-10-CM | POA: Diagnosis not present

## 2015-12-11 DIAGNOSIS — Z01419 Encounter for gynecological examination (general) (routine) without abnormal findings: Secondary | ICD-10-CM | POA: Diagnosis not present

## 2015-12-11 NOTE — Telephone Encounter (Signed)
FAXED PT MEDICAL RECORDS TO [employer] [phone] °

## 2015-12-12 ENCOUNTER — Other Ambulatory Visit: Payer: Self-pay | Admitting: Cardiovascular Disease

## 2015-12-15 ENCOUNTER — Ambulatory Visit (HOSPITAL_COMMUNITY)
Admission: RE | Admit: 2015-12-15 | Discharge: 2015-12-15 | Disposition: A | Discharge status: Home / Self Care | Payer: Medicare HMO | Source: Ambulatory Visit | Attending: Hematology and Oncology | Admitting: Hematology and Oncology

## 2015-12-15 ENCOUNTER — Telehealth: Payer: Self-pay | Admitting: *Deleted

## 2015-12-15 DIAGNOSIS — R918 Other nonspecific abnormal finding of lung field: Secondary | ICD-10-CM | POA: Insufficient documentation

## 2015-12-15 DIAGNOSIS — I517 Cardiomegaly: Secondary | ICD-10-CM | POA: Diagnosis not present

## 2015-12-15 DIAGNOSIS — M25561 Pain in right knee: Secondary | ICD-10-CM | POA: Diagnosis not present

## 2015-12-15 DIAGNOSIS — C8290 Follicular lymphoma, unspecified, unspecified site: Secondary | ICD-10-CM | POA: Diagnosis not present

## 2015-12-15 DIAGNOSIS — Z79899 Other long term (current) drug therapy: Secondary | ICD-10-CM | POA: Insufficient documentation

## 2015-12-15 DIAGNOSIS — C8238 Follicular lymphoma grade IIIa, lymph nodes of multiple sites: Secondary | ICD-10-CM | POA: Insufficient documentation

## 2015-12-15 DIAGNOSIS — R938 Abnormal findings on diagnostic imaging of other specified body structures: Secondary | ICD-10-CM | POA: Diagnosis not present

## 2015-12-15 LAB — GLUCOSE, CAPILLARY: Glucose-Capillary: 117 mg/dL — ABNORMAL HIGH (ref 65–99)

## 2015-12-15 MED ORDER — FLUDEOXYGLUCOSE F - 18 (FDG) INJECTION
6.8400 | Freq: Once | INTRAVENOUS | Status: AC | PRN
  Administered 2015-12-15: 6.84 via INTRAVENOUS

## 2015-12-15 NOTE — Progress Notes (Signed)
Patient ID: Carrie [name], female   DOB: [birth year], 75 y.o.   MRN: [id number]    HISTORY AND PHYSICAL   DATE: 07/07/15  Location:  [facility]    Place of Service: SNF [phone])   Extended Emergency Contact Information Primary Emergency Contact: Sturm,[name] Address: [place], [place] [address] Johnnette Litter of [place] Phone: [phone] Mobile Phone: [phone] Relation: Daughter Secondary Emergency Contact: Jonette Eva of [place] Phone: [phone] Mobile Phone: [phone] Relation: Son  Advanced Directive information   DNR; MOST FORM ON CHART - NO FT  Chief Complaint  Patient presents with  . New Admit To SNF    HPI:  75 yo female seen today as a new admission into SNF following hospital stay for sepsis due to E coli UTI, AKI, low K/high Na,  follicular NHL s/p chemotx,mucositis due to chemotx with oral thrush and esophagitis, anemia due to antineoplastic chemotx/AOCD, elevated Trp due to demand ischemia. Cr on admission 2.29-->1.14 at d/c. She was seen by cardio. 2D echo showed nml EF. Blood cx (+) E coli and neg at d/c. She was tx with IV abx -->po at d/c. A1c 6.8%. She has DM due to steroids. Electrolyte derangement corrected prior to d/c. Elevated Trp 0.5 thought to be due to demand ischemia. Hgb 8.5 at d/c. CBGs 140-150s. Albumin 3.2  She has no c/o today. Daughter present. She is scheduled to f/u with H/O next week. She is to take Cipro x 5 days and fluconazole x 5 days. She was given nystatin swish and swallow.   PAF - rate controlled on cardizem CD. She takes ASA daily  Thyroid - stable on levothyroxine. TSH 0.851  GERD - stable on protonix  Chronic pain due to cancer/hx arthritis - stable on oxycodone  Past Medical History  Diagnosis Date  . DJD (degenerative joint disease)   . Hypothyroid   . Cervical spondylosis without myelopathy 10/25/2013  . Dental bridge present     . Follicular lymphoma grade 3a ([place]) 02/03/2015  . Pneumonia   . Anemia     "years ago"  . Nausea without vomiting 06/20/2015  . Elevated troponin 02/18/2015  . Thrush of mouth and esophagus ([place]) 06/25/2015  . Atrial fibrillation with rapid ventricular response ([place]) 06/25/2015  . Stage III chronic kidney disease 07/01/2015  . History of echocardiogram     Echo 12/16: EF 60-65%, no RWMA, severe LAE  . History of nuclear stress test     Myoview 1/17: EF 55%, Normal study. No ischemia or scar.    Past Surgical History  Procedure Laterality Date  . Patelar tendon transplants      Left/right  . Abdominal hysterectomy    . Dilation and curettage of uterus    . Total knee arthroplasty  2011    Right  . Appendectomy    . Ovarian cyst resection    . Back surgery      X5-lumbar-fusion  . Lymph node biopsy Right 01/21/2015    Procedure: RIGHT GROIN LYMPH NODE BIOPSY;  Surgeon: Erroll Luna, MD;  Location: [facility];  Service: General;  Laterality: Right;  . Colonoscopy    . Portacath placement Right 02/13/2015    Procedure: INSERTION PORT-A-CATH WITH ULTRASOUND;  Surgeon: Erroll Luna, MD;  Location: [facility];  Service: General;  Laterality: Right;    Patient Care  Team: Arvella Nigh, MD as PCP - General (Obstetrics and Gynecology) Carola Frost, RN as Registered Nurse (Medical Oncology) [name] as Attending Physician (Internal Medicine)  Social History   Social History  . Marital Status: Widowed    Spouse Name: N/A  . Number of Children: 2  . Years of Education: hs   Occupational History  . Retired    Social History Main Topics  . Smoking status: Never Smoker   . Smokeless tobacco: Never Used  . Alcohol Use: Yes     Comment: occassional wine  . Drug Use: No  . Sexual Activity: Not on file   Other Topics Concern  . Not on file   Social History Narrative   Lives alone.  Has a walker for home use.     reports that she has never smoked. She has never  used smokeless tobacco. She reports that she drinks alcohol. She reports that she does not use illicit drugs.  Family History  Problem Relation Age of Onset  . Cancer Mother     Breast, lung NHL  . Cancer Sister     Multiple myeloma   Family Status  Relation Status Death Age  . Mother Deceased 72    breast cancer  . Sister Deceased 74    multiple myloma  . Father Deceased 49    drowned  . Brother Alive   . Maternal Grandmother Deceased   . Maternal Grandfather Deceased   . Paternal Grandmother Deceased   . Paternal Grandfather Deceased     Immunization History  Administered Date(s) Administered  . Influenza-Unspecified 08/25/2015    No Known Allergies  Medications: Patient's Medications  New Prescriptions   APIXABAN (ELIQUIS) 5 MG TABS TABLET    Take 1 tablet (5 mg total) by mouth 2 (two) times daily.   FUROSEMIDE (LASIX) 20 MG TABLET    Take 1 tablet (20 mg total) by mouth every Monday, Wednesday, and Friday.  Previous Medications   ACETAMINOPHEN (TYLENOL) 325 MG TABLET    Take 650 mg by mouth every 6 (six) hours as needed.   CALCIUM CARBONATE (TUMS) 500 MG CHEWABLE TABLET    Chew 2 tablets (400 mg of elemental calcium total) by mouth daily.   LEVOTHYROXINE (SYNTHROID, LEVOTHROID) 100 MCG TABLET    Take 100 mcg by mouth daily.   LIDOCAINE-PRILOCAINE (EMLA) CREAM    Apply 1 application topically as needed. Apply to port a cath site one hour prior to needle stick   LORATADINE (CLARITIN) 10 MG TABLET    Take 10 mg by mouth daily as needed.   Modified Medications   Modified Medication Previous Medication   DILTIAZEM (CARDIZEM CD) 240 MG 24 HR CAPSULE diltiazem (CARDIZEM CD) 180 MG 24 hr capsule      Take 1 capsule (240 mg total) by mouth daily.    Take 1 capsule (180 mg total) by mouth daily.   FLECAINIDE (TAMBOCOR) 50 MG TABLET flecainide (TAMBOCOR) 50 MG tablet      TAKE 1 TABLET (50 MG TOTAL) BY MOUTH 2 (TWO) TIMES DAILY.    Take 1 tablet (50 mg total) by mouth 2 (two)  times daily.  Discontinued Medications   ASPIRIN 81 MG TABLET    Take 81 mg by mouth daily.   ONDANSETRON (ZOFRAN ODT) 8 MG DISINTEGRATING TABLET    Take 1 tablet (8 mg total) by mouth every 8 (eight) hours as needed for nausea or vomiting.   PROCHLORPERAZINE (COMPAZINE) 10 MG TABLET  Take 1 tablet by mouth every 6 (six) hours as needed. Nausea/vomiting.    Review of Systems  Neurological: Positive for weakness.  All other systems reviewed and are negative.   Filed Vitals:   07/24/15 1023  BP: 122/72  Pulse: 88  Temp: 98.2 F (36.8 C)  SpO2: 99%   There is no weight on file to calculate BMI.  Physical Exam  Constitutional: She is oriented to person, place, and time. She appears well-developed.  Lying in bed resting. Daughter present. Frail appearing  HENT:  Mouth/Throat: Oropharynx is clear and moist. No oropharyngeal exudate.  No oral thrush  Eyes: Pupils are equal, round, and reactive to light. No scleral icterus.  Neck: Neck supple. Carotid bruit is not present. No tracheal deviation present. No thyromegaly present.  Cardiovascular: Normal rate, regular rhythm, normal heart sounds and intact distal pulses.  Exam reveals no gallop and no friction rub.   No murmur heard. Trace LLE edema. No RLE edema. No calf TTP  Pulmonary/Chest: Effort normal and breath sounds normal. No stridor. No respiratory distress. She has no wheezes. She has no rales.  Abdominal: Soft. Bowel sounds are normal. She exhibits no distension and no mass. There is no splenomegaly or hepatomegaly. There is no tenderness. There is no rebound and no guarding.  Musculoskeletal: She exhibits edema.  Lymphadenopathy:    She has no cervical adenopathy.  Neurological: She is alert and oriented to person, place, and time.  Skin: Skin is warm and dry. No rash noted.  Psychiatric: She has a normal mood and affect. Her behavior is normal. Thought content normal.     Labs reviewed: Admission on 06/25/2015,  Discharged on 07/06/2015  No results displayed because visit has over 200 results.  .  Appointment on 06/18/2015  Component Date Value Ref Range Status  . WBC 06/18/2015 16.2* 3.9 - 10.3 10e3/uL Final  . NEUT# 06/18/2015 14.5* 1.5 - 6.5 10e3/uL Final  . HGB 06/18/2015 10.4* 11.6 - 15.9 g/dL Final  . HCT 06/18/2015 31.7* 34.8 - 46.6 % Final  . Platelets 06/18/2015 206  145 - 400 10e3/uL Final  . MCV 06/18/2015 94.3  79.5 - 101.0 fL Final  . MCH 06/18/2015 31.1  25.1 - 34.0 pg Final  . MCHC 06/18/2015 33.0  31.5 - 36.0 g/dL Final  . RBC 06/18/2015 3.36* 3.70 - 5.45 10e6/uL Final  . RDW 06/18/2015 14.0  11.2 - 14.5 % Final  . lymph# 06/18/2015 0.2* 0.9 - 3.3 10e3/uL Final  . MONO# 06/18/2015 1.5* 0.1 - 0.9 10e3/uL Final  . Eosinophils Absolute 06/18/2015 0.0  0.0 - 0.5 10e3/uL Final  . Basophils Absolute 06/18/2015 0.1  0.0 - 0.1 10e3/uL Final  . NEUT% 06/18/2015 89.1* 38.4 - 76.8 % Final  . LYMPH% 06/18/2015 1.2* 14.0 - 49.7 % Final  . MONO% 06/18/2015 9.2  0.0 - 14.0 % Final  . EOS% 06/18/2015 0.1  0.0 - 7.0 % Final  . BASO% 06/18/2015 0.4  0.0 - 2.0 % Final  . Sodium 06/18/2015 136  136 - 145 mEq/L Final  . Potassium 06/18/2015 3.8  3.5 - 5.1 mEq/L Final  . Chloride 06/18/2015 102  98 - 109 mEq/L Final  . CO2 06/18/2015 26  22 - 29 mEq/L Final  . Glucose 06/18/2015 141* 70 - 140 mg/dl Final  . BUN 06/18/2015 24.0  7.0 - 26.0 mg/dL Final  . Creatinine 06/18/2015 1.1  0.6 - 1.1 mg/dL Final  . Total Bilirubin 06/18/2015 0.72  0.20 - 1.20  mg/dL Final  . Alkaline Phosphatase 06/18/2015 108  40 - 150 U/L Final  . AST 06/18/2015 21  5 - 34 U/L Final  . ALT 06/18/2015 14  0 - 55 U/L Final  . Total Protein 06/18/2015 6.6  6.4 - 8.3 g/dL Final  . Albumin 06/18/2015 3.2* 3.5 - 5.0 g/dL Final  . Calcium 06/18/2015 9.1  8.4 - 10.4 mg/dL Final  . Anion Gap 06/18/2015 9  3 - 11 mEq/L Final  . EGFR 06/18/2015 53* >90 ml/min/1.73 m2 Final   eGFR is calculated using the CKD-EPI Creatinine  Equation (2009)  . Magnesium 06/18/2015 2.1  1.5 - 2.5 mg/dl Final  Appointment on 06/02/2015  Component Date Value Ref Range Status  . WBC 06/02/2015 5.5  3.9 - 10.3 10e3/uL Final  . NEUT# 06/02/2015 4.1  1.5 - 6.5 10e3/uL Final  . HGB 06/02/2015 11.9  11.6 - 15.9 g/dL Final  . HCT 06/02/2015 35.0  34.8 - 46.6 % Final  . Platelets 06/02/2015 309  145 - 400 10e3/uL Final  . MCV 06/02/2015 94.1  79.5 - 101.0 fL Final  . MCH 06/02/2015 31.8  25.1 - 34.0 pg Final  . MCHC 06/02/2015 33.8  31.5 - 36.0 g/dL Final  . RBC 06/02/2015 3.73  3.70 - 5.45 10e6/uL Final  . RDW 06/02/2015 16.9* 11.2 - 14.5 % Final  . lymph# 06/02/2015 0.5* 0.9 - 3.3 10e3/uL Final  . MONO# 06/02/2015 0.8  0.1 - 0.9 10e3/uL Final  . Eosinophils Absolute 06/02/2015 0.1  0.0 - 0.5 10e3/uL Final  . Basophils Absolute 06/02/2015 0.1  0.0 - 0.1 10e3/uL Final  . NEUT% 06/02/2015 73.7  38.4 - 76.8 % Final  . LYMPH% 06/02/2015 9.2* 14.0 - 49.7 % Final  . MONO% 06/02/2015 13.8  0.0 - 14.0 % Final  . EOS% 06/02/2015 2.1  0.0 - 7.0 % Final  . BASO% 06/02/2015 1.2  0.0 - 2.0 % Final  . Sodium 06/02/2015 140  136 - 145 mEq/L Final  . Potassium 06/02/2015 4.0  3.5 - 5.1 mEq/L Final  . Chloride 06/02/2015 109  98 - 109 mEq/L Final  . CO2 06/02/2015 26  22 - 29 mEq/L Final  . Glucose 06/02/2015 104  70 - 140 mg/dl Final  . BUN 06/02/2015 24.6  7.0 - 26.0 mg/dL Final  . Creatinine 06/02/2015 0.8  0.6 - 1.1 mg/dL Final  . Total Bilirubin 06/02/2015 0.37  0.20 - 1.20 mg/dL Final  . Alkaline Phosphatase 06/02/2015 78  40 - 150 U/L Final  . AST 06/02/2015 16  5 - 34 U/L Final  . ALT 06/02/2015 13  0 - 55 U/L Final  . Total Protein 06/02/2015 6.3* 6.4 - 8.3 g/dL Final  . Albumin 06/02/2015 3.6  3.5 - 5.0 g/dL Final  . Calcium 06/02/2015 9.1  8.4 - 10.4 mg/dL Final  . Anion Gap 06/02/2015 5  3 - 11 mEq/L Final  . EGFR 06/02/2015 73* >90 ml/min/1.73 m2 Final   eGFR is calculated using the CKD-EPI Creatinine Equation (2009)    Appointment on 05/12/2015  Component Date Value Ref Range Status  . WBC 05/12/2015 5.0  3.9 - 10.3 10e3/uL Final  . NEUT# 05/12/2015 3.5  1.5 - 6.5 10e3/uL Final  . HGB 05/12/2015 12.2  11.6 - 15.9 g/dL Final  . HCT 05/12/2015 36.7  34.8 - 46.6 % Final  . Platelets 05/12/2015 249  145 - 400 10e3/uL Final  . MCV 05/12/2015 93.5  79.5 - 101.0 fL Final  .  MCH 05/12/2015 31.0  25.1 - 34.0 pg Final  . MCHC 05/12/2015 33.1  31.5 - 36.0 g/dL Final  . RBC 05/12/2015 3.93  3.70 - 5.45 10e6/uL Final  . RDW 05/12/2015 19.5* 11.2 - 14.5 % Final  . lymph# 05/12/2015 0.7* 0.9 - 3.3 10e3/uL Final  . MONO# 05/12/2015 0.6  0.1 - 0.9 10e3/uL Final  . Eosinophils Absolute 05/12/2015 0.2  0.0 - 0.5 10e3/uL Final  . Basophils Absolute 05/12/2015 0.1  0.0 - 0.1 10e3/uL Final  . NEUT% 05/12/2015 69.6  38.4 - 76.8 % Final  . LYMPH% 05/12/2015 13.5* 14.0 - 49.7 % Final  . MONO% 05/12/2015 12.1  0.0 - 14.0 % Final  . EOS% 05/12/2015 3.4  0.0 - 7.0 % Final  . BASO% 05/12/2015 1.4  0.0 - 2.0 % Final  . Sodium 05/12/2015 144  136 - 145 mEq/L Final  . Potassium 05/12/2015 4.5  3.5 - 5.1 mEq/L Final  . Chloride 05/12/2015 107  98 - 109 mEq/L Final  . CO2 05/12/2015 26  22 - 29 mEq/L Final  . Glucose 05/12/2015 121  70 - 140 mg/dl Final  . BUN 05/12/2015 24.9  7.0 - 26.0 mg/dL Final  . Creatinine 05/12/2015 0.9  0.6 - 1.1 mg/dL Final  . Total Bilirubin 05/12/2015 0.47  0.20 - 1.20 mg/dL Final  . Alkaline Phosphatase 05/12/2015 81  40 - 150 U/L Final  . AST 05/12/2015 16  5 - 34 U/L Final  . ALT 05/12/2015 11  0 - 55 U/L Final  . Total Protein 05/12/2015 6.4  6.4 - 8.3 g/dL Final  . Albumin 05/12/2015 3.8  3.5 - 5.0 g/dL Final  . Calcium 05/12/2015 9.3  8.4 - 10.4 mg/dL Final  . Anion Gap 05/12/2015 11  3 - 11 mEq/L Final  . EGFR 05/12/2015 64* >90 ml/min/1.73 m2 Final   eGFR is calculated using the CKD-EPI Creatinine Equation (2009)  Appointment on 04/22/2015  Component Date Value Ref Range Status  . WBC  04/22/2015 5.6  3.9 - 10.3 10e3/uL Final  . NEUT# 04/22/2015 3.9  1.5 - 6.5 10e3/uL Final  . HGB 04/22/2015 12.1  11.6 - 15.9 g/dL Final  . HCT 04/22/2015 36.0  34.8 - 46.6 % Final  . Platelets 04/22/2015 325  145 - 400 10e3/uL Final  . MCV 04/22/2015 91.0  79.5 - 101.0 fL Final  . MCH 04/22/2015 30.5  25.1 - 34.0 pg Final  . MCHC 04/22/2015 33.5  31.5 - 36.0 g/dL Final  . RBC 04/22/2015 3.95  3.70 - 5.45 10e6/uL Final  . RDW 04/22/2015 20.0* 11.2 - 14.5 % Final  . lymph# 04/22/2015 0.6* 0.9 - 3.3 10e3/uL Final  . MONO# 04/22/2015 0.9  0.1 - 0.9 10e3/uL Final  . Eosinophils Absolute 04/22/2015 0.2  0.0 - 0.5 10e3/uL Final  . Basophils Absolute 04/22/2015 0.1  0.0 - 0.1 10e3/uL Final  . NEUT% 04/22/2015 68.5  38.4 - 76.8 % Final  . LYMPH% 04/22/2015 10.9* 14.0 - 49.7 % Final  . MONO% 04/22/2015 16.5* 0.0 - 14.0 % Final  . EOS% 04/22/2015 2.9  0.0 - 7.0 % Final  . BASO% 04/22/2015 1.2  0.0 - 2.0 % Final  . Sodium 04/22/2015 141  136 - 145 mEq/L Final  . Potassium 04/22/2015 4.0  3.5 - 5.1 mEq/L Final  . Chloride 04/22/2015 106  98 - 109 mEq/L Final  . CO2 04/22/2015 26  22 - 29 mEq/L Final  . Glucose 04/22/2015 90  70 -  140 mg/dl Final  . BUN 04/22/2015 18.9  7.0 - 26.0 mg/dL Final  . Creatinine 04/22/2015 0.9  0.6 - 1.1 mg/dL Final  . Total Bilirubin 04/22/2015 0.53  0.20 - 1.20 mg/dL Final  . Alkaline Phosphatase 04/22/2015 81  40 - 150 U/L Final  . AST 04/22/2015 19  5 - 34 U/L Final  . ALT 04/22/2015 16  0 - 55 U/L Final  . Total Protein 04/22/2015 6.3* 6.4 - 8.3 g/dL Final  . Albumin 04/22/2015 3.7  3.5 - 5.0 g/dL Final  . Calcium 04/22/2015 9.7  8.4 - 10.4 mg/dL Final  . Anion Gap 04/22/2015 9  3 - 11 mEq/L Final  . EGFR 04/22/2015 62* >90 ml/min/1.73 m2 Final   eGFR is calculated using the CKD-EPI Creatinine Equation (2009)  Hospital Outpatient Visit on 04/18/2015  Component Date Value Ref Range Status  . Glucose-Capillary 04/18/2015 103* 65 - 99 mg/dL Final  Appointment  on 04/09/2015  Component Date Value Ref Range Status  . WBC 04/09/2015 7.9  3.9 - 10.3 10e3/uL Final  . NEUT# 04/09/2015 5.8  1.5 - 6.5 10e3/uL Final  . HGB 04/09/2015 12.3  11.6 - 15.9 g/dL Final  . HCT 04/09/2015 37.0  34.8 - 46.6 % Final  . Platelets 04/09/2015 200  145 - 400 10e3/uL Final  . MCV 04/09/2015 89.4  79.5 - 101.0 fL Final  . MCH 04/09/2015 29.8  25.1 - 34.0 pg Final  . MCHC 04/09/2015 33.3  31.5 - 36.0 g/dL Final  . RBC 04/09/2015 4.14  3.70 - 5.45 10e6/uL Final  . RDW 04/09/2015 18.7* 11.2 - 14.5 % Final  . lymph# 04/09/2015 1.0  0.9 - 3.3 10e3/uL Final  . MONO# 04/09/2015 0.5  0.1 - 0.9 10e3/uL Final  . Eosinophils Absolute 04/09/2015 0.4  0.0 - 0.5 10e3/uL Final  . Basophils Absolute 04/09/2015 0.1  0.0 - 0.1 10e3/uL Final  . NEUT% 04/09/2015 73.4  38.4 - 76.8 % Final  . LYMPH% 04/09/2015 13.1* 14.0 - 49.7 % Final  . MONO% 04/09/2015 6.6  0.0 - 14.0 % Final  . EOS% 04/09/2015 5.7  0.0 - 7.0 % Final  . BASO% 04/09/2015 1.2  0.0 - 2.0 % Final  . Sodium 04/09/2015 139  136 - 145 mEq/L Final  . Potassium 04/09/2015 4.4  3.5 - 5.1 mEq/L Final  . Chloride 04/09/2015 103  98 - 109 mEq/L Final  . CO2 04/09/2015 29  22 - 29 mEq/L Final  . Glucose 04/09/2015 129  70 - 140 mg/dl Final  . BUN 04/09/2015 18.4  7.0 - 26.0 mg/dL Final  . Creatinine 04/09/2015 0.9  0.6 - 1.1 mg/dL Final  . Total Bilirubin 04/09/2015 0.31  0.20 - 1.20 mg/dL Final  . Alkaline Phosphatase 04/09/2015 102  40 - 150 U/L Final  . AST 04/09/2015 13  5 - 34 U/L Final  . ALT 04/09/2015 14  0 - 55 U/L Final  . Total Protein 04/09/2015 6.3* 6.4 - 8.3 g/dL Final  . Albumin 04/09/2015 3.8  3.5 - 5.0 g/dL Final  . Calcium 04/09/2015 9.4  8.4 - 10.4 mg/dL Final  . Anion Gap 04/09/2015 7  3 - 11 mEq/L Final  . EGFR 04/09/2015 64* >90 ml/min/1.73 m2 Final   eGFR is calculated using the CKD-EPI Creatinine Equation (2009)    Nm Pet Image Restag (ps) Skull Base To Thigh  12/15/2015  CLINICAL DATA:  Subsequent  treatment strategy for grade 3 follicular lymphoma. EXAM: NUCLEAR MEDICINE PET SKULL  BASE TO THIGH TECHNIQUE: 6.8 mCi F-18 FDG was injected intravenously. Full-ring PET imaging was performed from the skull base to thigh after the radiotracer. CT data was obtained and used for attenuation correction and anatomic localization. FASTING BLOOD GLUCOSE:  Value: 117 mg/dl COMPARISON:  07/11/2015 FINDINGS: NECK No hypermetabolic lymph nodes in the neck. Mild hypermetabolism is again seen throughout the thyroid gland, suspicious for chronic thyroiditis. CHEST No hypermetabolic mediastinal or hilar nodes. Cardiomegaly stable. No evidence pericardial effusion. Scattered tiny bilateral pulmonary nodules remain stable, largest measuring 5 mm in the left lower lobe on image 40 of series 7. The show no metabolic activity but are too small to characterize by PET. No enlarging pulmonary nodules, infiltrate, or pleural effusion. ABDOMEN/PELVIS No abnormal hypermetabolic activity within the liver, pancreas, adrenal glands, or spleen. Mild left paraaortic retroperitoneal lymph nodes are seen, largest measuring 10 mm on image 122/series 4. This compares to 13 mm previously and shows no associated metabolic activity. SKELETON No focal hypermetabolic activity to suggest skeletal metastasis. IMPRESSION: Stable exam.  No evidence of metabolically active lymphoma. Stable low-grade thyroid hyper metabolism, suspicious for chronic thyroiditis. Stable tiny less than 5 mm indeterminate bilateral pulmonary nodules. Continued attention recommended on follow-up imaging. Electronically Signed   By: Earle Gell M.D.   On: 12/15/2015 09:13     Assessment/Plan   ICD-9-CM ICD-10-CM   1. Bacteremia due to Escherichia coli 790.7 R78.81   resolving 041.49    2. Thrush of mouth and esophagus ([employer]) - resolving 112.84 B37.81    112.0 B37.0   3. Mucositis due to chemotherapy 528.01 K12.31    E933.1    4. Moderate protein-calorie malnutrition (HCC)  - albumin 3.2 263.0 E44.0   5. PAF (paroxysmal atrial fibrillation) (HCC) 427.31 I48.0   6. Follicular non-Hodgkin's lymphoma (HCC) 202.80 C82.80   7. Hypothyroidism, unspecified hypothyroidism type 244.9 E03.9   8. Type 2 diabetes mellitus with hyperglycemia, without long-term current use of insulin (HCC) - diet controlled 250.00 E11.65    790.29    9. Anemia due to antineoplastic chemotherapy 285.3 D64.81    E933.1 T45.1X5A     Cont current meds as ordered  Complete abx and antifungal meds  Cont nutritional supplements  PT/OT/ST as ordered  F/u with H/O as scheduled next week  GOAL: short term rehab and d/c home when medically appropriate. Communicated with pt and nursing.  Will follow   S. Perlie Gold  [facility] [address] [place], [place] [address] [phone] Cell (Monday-Friday 8 AM - 5 PM) [phone] After 5 PM and follow prompts

## 2015-12-15 NOTE — Telephone Encounter (Signed)
Daughter states pt had PET this morning.  She would like to be present with patient when Dr. Alvy Bimler gives her test results and lives 3 hrs away.  She asks when will Dr. Alvy Bimler give her mother the results?

## 2015-12-15 NOTE — Telephone Encounter (Signed)
The PET scan is normal I did not schedule return appt because my schedule is full today Since it is a good result do I still need to schedule rtn appt? Please ask the daughter first before we call the patient

## 2015-12-15 NOTE — Telephone Encounter (Signed)
Informed pt's daughter and pt herself of PET scan normal.  Pt and daughter ok to keep appt w/ Dr. Alvy Bimler as scheduled for 3/17.   Lab is scheduled for 3/16 so I sent POF to move lab to 3/17 w/ Flush appt before seeing Dr. Alvy Bimler.

## 2015-12-17 ENCOUNTER — Telehealth: Payer: Self-pay | Admitting: Hematology and Oncology

## 2015-12-17 NOTE — Telephone Encounter (Signed)
er pf to sch pt appt-cld * left message of appt for 3/17 adv 9:45

## 2015-12-29 ENCOUNTER — Telehealth: Payer: Self-pay | Admitting: Hematology and Oncology

## 2015-12-29 NOTE — Telephone Encounter (Signed)
Faxed pt medical records to Dept of [facility] [phone]

## 2016-01-01 ENCOUNTER — Other Ambulatory Visit: Payer: Medicare HMO

## 2016-01-02 ENCOUNTER — Telehealth: Payer: Self-pay | Admitting: Cardiovascular Disease

## 2016-01-02 ENCOUNTER — Ambulatory Visit (HOSPITAL_BASED_OUTPATIENT_CLINIC_OR_DEPARTMENT_OTHER): Payer: Medicare HMO

## 2016-01-02 ENCOUNTER — Telehealth: Payer: Self-pay | Admitting: Hematology and Oncology

## 2016-01-02 ENCOUNTER — Ambulatory Visit (HOSPITAL_BASED_OUTPATIENT_CLINIC_OR_DEPARTMENT_OTHER): Payer: Medicare HMO | Admitting: Hematology and Oncology

## 2016-01-02 ENCOUNTER — Ambulatory Visit: Payer: Medicare HMO

## 2016-01-02 ENCOUNTER — Other Ambulatory Visit (HOSPITAL_BASED_OUTPATIENT_CLINIC_OR_DEPARTMENT_OTHER): Payer: Medicare HMO

## 2016-01-02 ENCOUNTER — Telehealth: Payer: Self-pay | Admitting: *Deleted

## 2016-01-02 ENCOUNTER — Encounter: Payer: Self-pay | Admitting: Hematology and Oncology

## 2016-01-02 VITALS — BP 124/87 | HR 67 | Temp 97.8°F | Resp 20

## 2016-01-02 VITALS — BP 129/73 | HR 84 | Temp 97.4°F | Resp 17 | Ht 61.0 in | Wt 144.4 lb

## 2016-01-02 DIAGNOSIS — C8238 Follicular lymphoma grade IIIa, lymph nodes of multiple sites: Secondary | ICD-10-CM

## 2016-01-02 DIAGNOSIS — I10 Essential (primary) hypertension: Secondary | ICD-10-CM

## 2016-01-02 DIAGNOSIS — I482 Chronic atrial fibrillation, unspecified: Secondary | ICD-10-CM

## 2016-01-02 DIAGNOSIS — C8230 Follicular lymphoma grade IIIa, unspecified site: Secondary | ICD-10-CM

## 2016-01-02 DIAGNOSIS — N179 Acute kidney failure, unspecified: Secondary | ICD-10-CM

## 2016-01-02 DIAGNOSIS — Z95828 Presence of other vascular implants and grafts: Secondary | ICD-10-CM

## 2016-01-02 DIAGNOSIS — Z5112 Encounter for antineoplastic immunotherapy: Secondary | ICD-10-CM

## 2016-01-02 LAB — COMPREHENSIVE METABOLIC PANEL
ALT: 14 U/L (ref 0–55)
AST: 17 U/L (ref 5–34)
Albumin: 3.8 g/dL (ref 3.5–5.0)
Alkaline Phosphatase: 86 U/L (ref 40–150)
Anion Gap: 10 mEq/L (ref 3–11)
BUN: 33.7 mg/dL — ABNORMAL HIGH (ref 7.0–26.0)
CO2: 26 mEq/L (ref 22–29)
Calcium: 9.3 mg/dL (ref 8.4–10.4)
Chloride: 107 mEq/L (ref 98–109)
Creatinine: 1.5 mg/dL — ABNORMAL HIGH (ref 0.6–1.1)
EGFR: 35 mL/min/{1.73_m2} — ABNORMAL LOW (ref >90)
Glucose: 159 mg/dl — ABNORMAL HIGH (ref 70–140)
Potassium: 3.8 mEq/L (ref 3.5–5.1)
Sodium: 143 mEq/L (ref 136–145)
Total Bilirubin: 0.51 mg/dL (ref 0.20–1.20)
Total Protein: 6.8 g/dL (ref 6.4–8.3)

## 2016-01-02 LAB — CBC WITH DIFFERENTIAL/PLATELET
BASO%: 0.6 % (ref 0.0–2.0)
Basophils Absolute: 0 10*3/uL (ref 0.0–0.1)
EOS%: 2.7 % (ref 0.0–7.0)
Eosinophils Absolute: 0.2 10*3/uL (ref 0.0–0.5)
HCT: 41.1 % (ref 34.8–46.6)
HGB: 13.4 g/dL (ref 11.6–15.9)
LYMPH%: 9 % — ABNORMAL LOW (ref 14.0–49.7)
MCH: 29.9 pg (ref 25.1–34.0)
MCHC: 32.5 g/dL (ref 31.5–36.0)
MCV: 91.8 fL (ref 79.5–101.0)
MONO#: 0.6 10*3/uL (ref 0.1–0.9)
MONO%: 8.3 % (ref 0.0–14.0)
NEUT#: 5.8 10*3/uL (ref 1.5–6.5)
NEUT%: 79.4 % — ABNORMAL HIGH (ref 38.4–76.8)
Platelets: 247 10*3/uL (ref 145–400)
RBC: 4.47 10*6/uL (ref 3.70–5.45)
RDW: 14.8 % — ABNORMAL HIGH (ref 11.2–14.5)
WBC: 7.4 10*3/uL (ref 3.9–10.3)
lymph#: 0.7 10*3/uL — ABNORMAL LOW (ref 0.9–3.3)

## 2016-01-02 MED ORDER — SODIUM CHLORIDE 0.9 % IV SOLN
Freq: Once | INTRAVENOUS | Status: AC
  Administered 2016-01-02: 11:00:00 via INTRAVENOUS

## 2016-01-02 MED ORDER — SODIUM CHLORIDE 0.9 % IJ SOLN
10.0000 mL | INTRAMUSCULAR | Status: DC | PRN
  Administered 2016-01-02: 10 mL
  Filled 2016-01-02: qty 10

## 2016-01-02 MED ORDER — ACETAMINOPHEN 325 MG PO TABS
650.0000 mg | ORAL_TABLET | Freq: Once | ORAL | Status: AC
  Administered 2016-01-02: 650 mg via ORAL

## 2016-01-02 MED ORDER — DIPHENHYDRAMINE HCL 25 MG PO CAPS
ORAL_CAPSULE | ORAL | Status: AC
  Filled 2016-01-02: qty 2

## 2016-01-02 MED ORDER — HEPARIN SOD (PORK) LOCK FLUSH 100 UNIT/ML IV SOLN
500.0000 [IU] | Freq: Once | INTRAVENOUS | Status: AC | PRN
  Administered 2016-01-02: 500 [IU]
  Filled 2016-01-02: qty 5

## 2016-01-02 MED ORDER — ACETAMINOPHEN 325 MG PO TABS
ORAL_TABLET | ORAL | Status: AC
  Filled 2016-01-02: qty 2

## 2016-01-02 MED ORDER — SODIUM CHLORIDE 0.9 % IV SOLN
375.0000 mg/m2 | Freq: Once | INTRAVENOUS | Status: AC
  Administered 2016-01-02: 600 mg via INTRAVENOUS
  Filled 2016-01-02: qty 50

## 2016-01-02 MED ORDER — DIPHENHYDRAMINE HCL 25 MG PO CAPS
50.0000 mg | ORAL_CAPSULE | Freq: Once | ORAL | Status: AC
  Administered 2016-01-02: 50 mg via ORAL

## 2016-01-02 MED ORDER — SODIUM CHLORIDE 0.9% FLUSH
10.0000 mL | INTRAVENOUS | Status: DC | PRN
  Administered 2016-01-02: 10 mL via INTRAVENOUS
  Filled 2016-01-02: qty 10

## 2016-01-02 NOTE — Telephone Encounter (Signed)
I spoke with the pt and made her aware of instructions per [name] PA-C. She verbalized understanding of instructions and will contact the office with any other questions or concerns.

## 2016-01-02 NOTE — Assessment & Plan Note (Signed)
I reviewed recent PET CT scan which show no evidence of disease. We will continue rituximab every 60 days for 2 years. She is not due for another imaging study until a year from now.

## 2016-01-02 NOTE — Assessment & Plan Note (Signed)
She is currently rate controlled. She will continue medical management

## 2016-01-02 NOTE — Telephone Encounter (Signed)
Carrie [name] is calling about an elevated Creatine and that she his taking treatment for it now . Please call   Thanks

## 2016-01-02 NOTE — Telephone Encounter (Signed)
Gave and prionted appt sched and avs fo rpt for May

## 2016-01-02 NOTE — Patient Instructions (Signed)

## 2016-01-02 NOTE — Patient Instructions (Signed)
[facility] Discharge Instructions for Patients Receiving Chemotherapy  Today you received the following chemotherapy agents:  Rituxan.  To help prevent nausea and vomiting after your treatment, we encourage you to take your nausea medication as ordered per MD.   If you develop nausea and vomiting that is not controlled by your nausea medication, call the clinic.   BELOW ARE SYMPTOMS THAT SHOULD BE REPORTED IMMEDIATELY:  *FEVER GREATER THAN 100.5 F  *CHILLS WITH OR WITHOUT FEVER  NAUSEA AND VOMITING THAT IS NOT CONTROLLED WITH YOUR NAUSEA MEDICATION  *UNUSUAL SHORTNESS OF BREATH  *UNUSUAL BRUISING OR BLEEDING  TENDERNESS IN MOUTH AND THROAT WITH OR WITHOUT PRESENCE OF ULCERS  *URINARY PROBLEMS  *BOWEL PROBLEMS  UNUSUAL RASH Items with * indicate a potential emergency and should be followed up as soon as possible.  Feel free to call the clinic you have any questions or concerns. The clinic phone number is [phone].  Please show the CHEMO ALERT CARD at check-in to the Emergency Department and triage nurse.   

## 2016-01-02 NOTE — Progress Notes (Signed)
[facility] OFFICE PROGRESS NOTE  Patient Care Team: Arvella Nigh, MD as PCP - General (Obstetrics and Gynecology) Carola Frost, RN as Registered Nurse (Medical Oncology) [name] as Attending Physician (Internal Medicine)  SUMMARY OF ONCOLOGIC HISTORY:   Grade 3a follicular lymphoma of lymph nodes of multiple regions [facility])   01/21/2015 Surgery She underwent excisional lymph node biopsy that came back follicular lymphoma grade 3   01/21/2015 Pathology Results Accession: [id number] biopsy confirmed follicular lymphoma   04/15/4764 Imaging Echocardiogram showed ejection fraction of 55-60%   02/11/2015 Imaging  PET CT scan show possible splenic involvement and diffuse lymphadenopathy throughout   02/11/2015 Bone Marrow Biopsy  bone marrow biopsy was performed and is involved by lymphoma with translocation of igH/BCL2   02/13/2015 Procedure She had port placement.   02/17/2015 - 06/02/2015 Chemotherapy She received R-CHOP chemo x 6   02/17/2015 Adverse Reaction She had mild infusion reaction with cycle 1 of treatment.   04/18/2015 Imaging PET CT scan showed near complete response to treatment.   04/22/2015 Adverse Reaction Vincristine dose was reduced by 50% due to neuropathy from cycle 4 onwards   06/25/2015 - 07/06/2015 Hospital Admission She was hospitalized for recent sepsis/bacteremia and a fib with RVR   07/11/2015 Imaging repeat PEt scan showed complete response to Rx   07/14/2015 -  Chemotherapy She received maintenance Rituximab every 60 days   12/15/2015 Imaging PET CT showed no evidence of cancer recurrence     INTERVAL HISTORY: Please see below for problem oriented charting. She returns prior to cycle 4 of treatment. She feels well. Denies recent infection. No chest pain or shortness of breath. She is taking anticoagulation therapy and intermittent doses of Lasix. REVIEW OF SYSTEMS:   Constitutional: Denies fevers, chills or abnormal weight loss Eyes: Denies blurriness of  vision Ears, nose, mouth, throat, and face: Denies mucositis or sore throat Respiratory: Denies cough, dyspnea or wheezes Cardiovascular: Denies palpitation, chest discomfort or lower extremity swelling Gastrointestinal:  Denies nausea, heartburn or change in bowel habits Skin: Denies abnormal skin rashes Lymphatics: Denies new lymphadenopathy or easy bruising Neurological:Denies numbness, tingling or new weaknesses Behavioral/Psych: Mood is stable, no new changes  All other systems were reviewed with the patient and are negative.  I have reviewed the past medical history, past surgical history, social history and family history with the patient and they are unchanged from previous note.  ALLERGIES:  has No Known Allergies.  MEDICATIONS:  Current Outpatient Prescriptions  Medication Sig Dispense Refill  . acetaminophen (TYLENOL) 325 MG tablet Take 650 mg by mouth every 6 (six) hours as needed.    Marland Kitchen apixaban (ELIQUIS) 5 MG TABS tablet Take 1 tablet (5 mg total) by mouth 2 (two) times daily. 60 tablet 5  . calcium carbonate (TUMS) 500 MG chewable tablet Chew 2 tablets (400 mg of elemental calcium total) by mouth daily. (Patient taking differently: Chew 2 tablets by mouth daily as needed for heartburn. ) 2 tablet 0  . diltiazem (CARDIZEM CD) 240 MG 24 hr capsule Take 1 capsule (240 mg total) by mouth daily. 30 capsule 5  . flecainide (TAMBOCOR) 50 MG tablet TAKE 1 TABLET (50 MG TOTAL) BY MOUTH 2 (TWO) TIMES DAILY. 60 tablet 9  . furosemide (LASIX) 20 MG tablet Take 1 tablet (20 mg total) by mouth every Monday, Wednesday, and Friday. 30 tablet 11  . levothyroxine (SYNTHROID, LEVOTHROID) 100 MCG tablet Take 100 mcg by mouth daily.  9  . lidocaine-prilocaine (EMLA) cream  Apply 1 application topically as needed. Apply to port a cath site one hour prior to needle stick 30 g 2  . loratadine (CLARITIN) 10 MG tablet Take 10 mg by mouth daily as needed.      No current facility-administered  medications for this visit.   Facility-Administered Medications Ordered in Other Visits  Medication Dose Route Frequency Provider Last Rate Last Dose  . heparin lock flush 100 unit/mL  500 Units Intracatheter Once PRN [name], MD      . ondansetron (ZOFRAN) 8 mg in sodium chloride 0.9 % 50 mL IVPB   Intravenous Once [name], MD      . sodium chloride 0.9 % injection 10 mL  10 mL Intracatheter PRN [name], MD        PHYSICAL EXAMINATION: ECOG PERFORMANCE STATUS: 1 - Symptomatic but completely ambulatory  Filed Vitals:   01/02/16 1011  BP: 129/73  Pulse: 84  Temp: 97.4 F (36.3 C)  Resp: 17   Filed Weights   01/02/16 1011  Weight: 144 lb 6.4 oz (65.499 kg)    GENERAL:alert, no distress and comfortable SKIN: skin color, texture, turgor are normal, no rashes or significant lesions EYES: normal, Conjunctiva are pink and non-injected, sclera clear OROPHARYNX:no exudate, no erythema and lips, buccal mucosa, and tongue normal  NECK: supple, thyroid normal size, non-tender, without nodularity LYMPH:  no palpable lymphadenopathy in the cervical, axillary or inguinal LUNGS: clear to auscultation and percussion with normal breathing effort HEART: regular rate & rhythm and no murmurs and no lower extremity edema ABDOMEN:abdomen soft, non-tender and normal bowel sounds Musculoskeletal:no cyanosis of digits and no clubbing  NEURO: alert & oriented x 3 with fluent speech, no focal motor/sensory deficits  LABORATORY DATA:  I have reviewed the data as listed    Component Value Date/Time   NA 143 01/02/2016 0951   NA 142 10/22/2015 0744   K 3.8 01/02/2016 0951   K 3.9 10/22/2015 0744   CL 107 10/22/2015 0744   CO2 26 01/02/2016 0951   CO2 26 10/22/2015 0744   GLUCOSE 159* 01/02/2016 0951   GLUCOSE 120* 10/22/2015 0744   BUN 33.7* 01/02/2016 0951   BUN 24 10/22/2015 0744   CREATININE 1.5* 01/02/2016 0951   CREATININE 1.26* 10/22/2015 0744   CREATININE 1.14* 07/04/2015 0537    CALCIUM 9.3 01/02/2016 0951   CALCIUM 9.1 10/22/2015 0744   PROT 6.8 01/02/2016 0951   PROT 6.2* 06/25/2015 1215   ALBUMIN 3.8 01/02/2016 0951   ALBUMIN 2.8* 06/25/2015 1215   AST 17 01/02/2016 0951   AST 157* 06/25/2015 1215   ALT 14 01/02/2016 0951   ALT 106* 06/25/2015 1215   ALKPHOS 86 01/02/2016 0951   ALKPHOS 106 06/25/2015 1215   BILITOT 0.51 01/02/2016 0951   BILITOT 0.7 06/25/2015 1215   GFRNONAA 47* 07/04/2015 0537   GFRAA 54* 07/04/2015 0537    No results found for: SPEP, UPEP  Lab Results  Component Value Date   WBC 7.4 01/02/2016   NEUTROABS 5.8 01/02/2016   HGB 13.4 01/02/2016   HCT 41.1 01/02/2016   MCV 91.8 01/02/2016   PLT 247 01/02/2016      Chemistry      Component Value Date/Time   NA 143 01/02/2016 0951   NA 142 10/22/2015 0744   K 3.8 01/02/2016 0951   K 3.9 10/22/2015 0744   CL 107 10/22/2015 0744   CO2 26 01/02/2016 0951   CO2 26 10/22/2015 0744   BUN 33.7*  01/02/2016 0951   BUN 24 10/22/2015 0744   CREATININE 1.5* 01/02/2016 0951   CREATININE 1.26* 10/22/2015 0744   CREATININE 1.14* 07/04/2015 0537      Component Value Date/Time   CALCIUM 9.3 01/02/2016 0951   CALCIUM 9.1 10/22/2015 0744   ALKPHOS 86 01/02/2016 0951   ALKPHOS 106 06/25/2015 1215   AST 17 01/02/2016 0951   AST 157* 06/25/2015 1215   ALT 14 01/02/2016 0951   ALT 106* 06/25/2015 1215   BILITOT 0.51 01/02/2016 0951   BILITOT 0.7 06/25/2015 1215       RADIOGRAPHIC STUDIES:I reviewed her recent PET CT scan with her and her daughter I have personally reviewed the radiological images as listed and agreed with the findings in the report.    ASSESSMENT & PLAN:  Grade 3a follicular lymphoma of lymph nodes of multiple regions [facility]) I reviewed recent PET CT scan which show no evidence of disease. We will continue rituximab every 60 days for 2 years. She is not due for another imaging study until a year from now.  Acute kidney injury ([place]) She had recent elevated  creatinine likely due to recent diuretic therapy. I recommend she hold Lasix and follow-up with her primary care doctor and cardiologist closely  Chronic atrial fibrillation [facility]) She is currently rate controlled. She will continue medical management   No orders of the defined types were placed in this encounter.   All questions were answered. The patient knows to call the clinic with any problems, questions or concerns. No barriers to learning was detected. I spent 15 minutes counseling the patient face to face. The total time spent in the appointment was 20 minutes and more than 50% was on counseling and review of test results     [facility], [place], MD 01/02/2016 12:31 PM

## 2016-01-02 NOTE — Telephone Encounter (Signed)
Liliane Shi, PA-C at 01/02/2016 2:23 PM     Status: Signed       Expand All Collapse All   Ok to hold Lasix Weigh daily If weight increases 3 lbs in 1 day or 5 lbs in 1 week or increased swelling or increased shortness of breath, call. [name], PA-C  01/02/2016 2:24 PM        I spoke with the pt and made her aware of instructions per [name] PA-C. She verbalized understanding of instructions and will contact the office with any other questions or concerns.

## 2016-01-02 NOTE — Telephone Encounter (Signed)
S/w pt in infusion room.  She is receiving her Rituxan treatment today for Lymphoma.  Informed pt of Dr. Calton Dach message below.  She verbalized understanding and will contact her Cardiologist to f/u.

## 2016-01-02 NOTE — Assessment & Plan Note (Signed)
She had recent elevated creatinine likely due to recent diuretic therapy. I recommend she hold Lasix and follow-up with her primary care doctor and cardiologist closely

## 2016-01-02 NOTE — Telephone Encounter (Signed)
Ok to hold Lasix Weigh daily If weight increases 3 lbs in 1 day or 5 lbs in 1 week or increased swelling or increased shortness of breath, call. [name], PA-C   01/02/2016 2:24 PM

## 2016-01-02 NOTE — Telephone Encounter (Signed)
-----   Message from [name], MD sent at 01/02/2016 10:42 AM EDT ----- Regarding: elevated creatine Pls let her know creatinine is a bit elevated She needs to hold lasix for next few days and drink more liquids She needs close follow-up with PCP/cardiologist for kidney test monitoring ----- Message -----    From: [facility] Interface    Sent: 01/02/2016  10:08 AM      To: [name], MD

## 2016-01-14 DIAGNOSIS — Z471 Aftercare following joint replacement surgery: Secondary | ICD-10-CM | POA: Diagnosis not present

## 2016-01-14 DIAGNOSIS — Z96651 Presence of right artificial knee joint: Secondary | ICD-10-CM | POA: Diagnosis not present

## 2016-01-14 DIAGNOSIS — M25561 Pain in right knee: Secondary | ICD-10-CM | POA: Diagnosis not present

## 2016-01-14 DIAGNOSIS — M25861 Other specified joint disorders, right knee: Secondary | ICD-10-CM | POA: Diagnosis not present

## 2016-01-15 ENCOUNTER — Other Ambulatory Visit (HOSPITAL_COMMUNITY): Payer: Self-pay | Admitting: Orthopedic Surgery

## 2016-01-15 DIAGNOSIS — Z471 Aftercare following joint replacement surgery: Secondary | ICD-10-CM

## 2016-01-15 DIAGNOSIS — Z96651 Presence of right artificial knee joint: Principal | ICD-10-CM

## 2016-01-23 ENCOUNTER — Encounter (HOSPITAL_COMMUNITY)
Admission: RE | Admit: 2016-01-23 | Discharge: 2016-01-23 | Disposition: A | Discharge status: Home / Self Care | Payer: Medicare HMO | Source: Ambulatory Visit | Attending: Orthopedic Surgery | Admitting: Orthopedic Surgery

## 2016-01-23 DIAGNOSIS — Z96651 Presence of right artificial knee joint: Secondary | ICD-10-CM | POA: Diagnosis not present

## 2016-01-23 DIAGNOSIS — R6889 Other general symptoms and signs: Secondary | ICD-10-CM | POA: Insufficient documentation

## 2016-01-23 DIAGNOSIS — M25561 Pain in right knee: Secondary | ICD-10-CM | POA: Diagnosis not present

## 2016-01-23 DIAGNOSIS — Z471 Aftercare following joint replacement surgery: Secondary | ICD-10-CM | POA: Diagnosis not present

## 2016-01-23 MED ORDER — TECHNETIUM TC 99M MEDRONATE IV KIT
27.1000 | PACK | Freq: Once | INTRAVENOUS | Status: AC | PRN
  Administered 2016-01-23: 27.1 via INTRAVENOUS

## 2016-02-10 DIAGNOSIS — R6884 Jaw pain: Secondary | ICD-10-CM | POA: Diagnosis not present

## 2016-02-10 DIAGNOSIS — H9201 Otalgia, right ear: Secondary | ICD-10-CM | POA: Diagnosis not present

## 2016-02-18 ENCOUNTER — Emergency Department (HOSPITAL_COMMUNITY): Payer: Medicare HMO

## 2016-02-18 ENCOUNTER — Encounter (HOSPITAL_COMMUNITY): Payer: Self-pay | Admitting: Emergency Medicine

## 2016-02-18 ENCOUNTER — Emergency Department (HOSPITAL_COMMUNITY)
Admission: EM | Admit: 2016-02-18 | Discharge: 2016-02-18 | Disposition: A | Discharge status: Home / Self Care | Payer: Medicare HMO | Attending: Emergency Medicine | Admitting: Emergency Medicine

## 2016-02-18 DIAGNOSIS — Z8701 Personal history of pneumonia (recurrent): Secondary | ICD-10-CM | POA: Diagnosis not present

## 2016-02-18 DIAGNOSIS — Z79899 Other long term (current) drug therapy: Secondary | ICD-10-CM | POA: Diagnosis not present

## 2016-02-18 DIAGNOSIS — E039 Hypothyroidism, unspecified: Secondary | ICD-10-CM | POA: Diagnosis not present

## 2016-02-18 DIAGNOSIS — E860 Dehydration: Secondary | ICD-10-CM

## 2016-02-18 DIAGNOSIS — Z862 Personal history of diseases of the blood and blood-forming organs and certain disorders involving the immune mechanism: Secondary | ICD-10-CM | POA: Insufficient documentation

## 2016-02-18 DIAGNOSIS — Z8619 Personal history of other infectious and parasitic diseases: Secondary | ICD-10-CM | POA: Diagnosis not present

## 2016-02-18 DIAGNOSIS — M1990 Unspecified osteoarthritis, unspecified site: Secondary | ICD-10-CM | POA: Insufficient documentation

## 2016-02-18 DIAGNOSIS — I517 Cardiomegaly: Secondary | ICD-10-CM | POA: Diagnosis not present

## 2016-02-18 DIAGNOSIS — Z8579 Personal history of other malignant neoplasms of lymphoid, hematopoietic and related tissues: Secondary | ICD-10-CM | POA: Insufficient documentation

## 2016-02-18 DIAGNOSIS — Z7901 Long term (current) use of anticoagulants: Secondary | ICD-10-CM | POA: Insufficient documentation

## 2016-02-18 DIAGNOSIS — I4891 Unspecified atrial fibrillation: Secondary | ICD-10-CM | POA: Diagnosis not present

## 2016-02-18 DIAGNOSIS — N183 Chronic kidney disease, stage 3 (moderate): Secondary | ICD-10-CM | POA: Diagnosis not present

## 2016-02-18 DIAGNOSIS — R531 Weakness: Secondary | ICD-10-CM | POA: Diagnosis present

## 2016-02-18 LAB — BASIC METABOLIC PANEL
Anion gap: 9 (ref 5–15)
BUN: 25 mg/dL — ABNORMAL HIGH (ref 6–20)
CO2: 25 mmol/L (ref 22–32)
Calcium: 9.2 mg/dL (ref 8.9–10.3)
Chloride: 103 mmol/L (ref 101–111)
Creatinine, Ser: 1.26 mg/dL — ABNORMAL HIGH (ref 0.44–1.00)
GFR calc Af Amer: 48 mL/min — ABNORMAL LOW (ref >60)
GFR calc non Af Amer: 41 mL/min — ABNORMAL LOW (ref >60)
Glucose, Bld: 141 mg/dL — ABNORMAL HIGH (ref 65–99)
Potassium: 4.1 mmol/L (ref 3.5–5.1)
Sodium: 137 mmol/L (ref 135–145)

## 2016-02-18 LAB — CBC WITH DIFFERENTIAL/PLATELET
Basophils Absolute: 0 10*3/uL (ref 0.0–0.1)
Basophils Relative: 0 %
Eosinophils Absolute: 0.1 10*3/uL (ref 0.0–0.7)
Eosinophils Relative: 1 %
HCT: 40.4 % (ref 36.0–46.0)
Hemoglobin: 13.7 g/dL (ref 12.0–15.0)
Lymphocytes Relative: 6 %
Lymphs Abs: 0.4 10*3/uL — ABNORMAL LOW (ref 0.7–4.0)
MCH: 30.2 pg (ref 26.0–34.0)
MCHC: 33.9 g/dL (ref 30.0–36.0)
MCV: 89.2 fL (ref 78.0–100.0)
Monocytes Absolute: 0.4 10*3/uL (ref 0.1–1.0)
Monocytes Relative: 5 %
Neutro Abs: 6 10*3/uL (ref 1.7–7.7)
Neutrophils Relative %: 88 %
Platelets: 234 10*3/uL (ref 150–400)
RBC: 4.53 MIL/uL (ref 3.87–5.11)
RDW: 13.1 % (ref 11.5–15.5)
WBC: 6.8 10*3/uL (ref 4.0–10.5)

## 2016-02-18 LAB — TROPONIN I: Troponin I: 0.04 ng/mL — ABNORMAL HIGH (ref <0.031)

## 2016-02-18 LAB — URINALYSIS, ROUTINE W REFLEX MICROSCOPIC
Bilirubin Urine: NEGATIVE
Glucose, UA: NEGATIVE mg/dL
Hgb urine dipstick: NEGATIVE
Ketones, ur: NEGATIVE mg/dL
Leukocytes, UA: NEGATIVE
Nitrite: NEGATIVE
Protein, ur: NEGATIVE mg/dL
Specific Gravity, Urine: 1.026 (ref 1.005–1.030)
pH: 5.5 (ref 5.0–8.0)

## 2016-02-18 LAB — TSH: TSH: 0.384 u[IU]/mL (ref 0.350–4.500)

## 2016-02-18 LAB — BRAIN NATRIURETIC PEPTIDE: B Natriuretic Peptide: 82.4 pg/mL (ref 0.0–100.0)

## 2016-02-18 LAB — I-STAT CG4 LACTIC ACID, ED: Lactic Acid, Venous: 1 mmol/L (ref 0.5–2.0)

## 2016-02-18 MED ORDER — SODIUM CHLORIDE 0.9 % IV BOLUS (SEPSIS)
1000.0000 mL | Freq: Once | INTRAVENOUS | Status: AC
  Administered 2016-02-18: 1000 mL via INTRAVENOUS

## 2016-02-18 NOTE — ED Notes (Signed)
Pt ambulated to bathroom without difficulty.

## 2016-02-18 NOTE — ED Notes (Signed)
Patient here with complaints of weakness that started today. Reports generalized body aches-pain. Pain 4/10.

## 2016-02-18 NOTE — Discharge Instructions (Signed)

## 2016-02-18 NOTE — ED Provider Notes (Signed)
CSN: [id number]     Arrival date & time 02/18/16  1742 History   First MD Initiated Contact with Patient 02/18/16 1847     Chief Complaint  Patient presents with  . Weakness  . Nausea  PT SAID SHE WAS FINE YESTERDAY, THEN WOKE UP THIS AM WITH GENERALIZED BODY ACHES.  SHE WAS UROSEPTIC IN September OF 2016 AND WAS CONCERNED THAT SHE WAS AGAIN.  THE PT HAD SOME DIARRHEA EARLIER IN THE DAY, BUT THAT HAS RESOLVED.  PT DENIES CP/SOB/VOMITING.   (Consider location/radiation/quality/duration/timing/severity/associated sxs/prior Treatment) Patient is a 75 y.o. female presenting with weakness. The history is provided by the patient.  Weakness This is a new problem. The current episode started 6 to 12 hours ago. The problem occurs constantly. The problem has not changed since onset.   Past Medical History  Diagnosis Date  . DJD (degenerative joint disease)   . Hypothyroid   . Cervical spondylosis without myelopathy 10/25/2013  . Dental bridge present   . Follicular lymphoma grade 3a ([place]) 02/03/2015  . Pneumonia   . Anemia     "years ago"  . Nausea without vomiting 06/20/2015  . Elevated troponin 02/18/2015  . Thrush of mouth and esophagus ([place]) 06/25/2015  . Atrial fibrillation with rapid ventricular response ([place]) 06/25/2015  . Stage III chronic kidney disease 07/01/2015  . History of echocardiogram     Echo 12/16: EF 60-65%, no RWMA, severe LAE  . History of nuclear stress test     Myoview 1/17: EF 55%, Normal study. No ischemia or scar.   Past Surgical History  Procedure Laterality Date  . Patelar tendon transplants      Left/right  . Abdominal hysterectomy    . Dilation and curettage of uterus    . Total knee arthroplasty  2011    Right  . Appendectomy    . Ovarian cyst resection    . Back surgery      X5-lumbar-fusion  . Lymph node biopsy Right 01/21/2015    Procedure: RIGHT GROIN LYMPH NODE BIOPSY;  Surgeon: Erroll Luna, MD;  Location: [facility];  Service: General;   Laterality: Right;  . Colonoscopy    . Portacath placement Right 02/13/2015    Procedure: INSERTION PORT-A-CATH WITH ULTRASOUND;  Surgeon: Erroll Luna, MD;  Location: [facility];  Service: General;  Laterality: Right;   Family History  Problem Relation Age of Onset  . Cancer Mother     Breast, lung NHL  . Cancer Sister     Multiple myeloma   Social History  Substance Use Topics  . Smoking status: Never Smoker   . Smokeless tobacco: Never Used  . Alcohol Use: Yes     Comment: occassional wine   OB History    No data available     Review of Systems  Gastrointestinal: Positive for nausea.  Neurological: Positive for weakness.  All other systems reviewed and are negative.     Allergies  Review of patient's allergies indicates no known allergies.  Home Medications   Prior to Admission medications   Medication Sig Start Date End Date Taking? Authorizing Provider  acetaminophen (TYLENOL) 325 MG tablet Take 325 mg by mouth every 6 (six) hours as needed for moderate pain.    Yes Historical Provider, MD  amoxicillin (AMOXIL) 500 MG tablet Take 500 mg by mouth as directed. Before dental . Take 4 tablets.   Yes Historical Provider, MD  apixaban (ELIQUIS) 5 MG TABS tablet Take 1 tablet (5 mg  total) by mouth 2 (two) times daily. 08/05/15  Yes Scott T Kathlen Mody, PA-C  Ca Carbonate-Mag Hydroxide (ROLAIDS) 550-110 MG CHEW Chew 2 tablets by mouth 2 (two) times daily as needed (indigestion).   Yes Historical Provider, MD  calcium carbonate (TUMS) 500 MG chewable tablet Chew 2 tablets (400 mg of elemental calcium total) by mouth daily. 06/21/15  Yes Volanda Napoleon, MD  diltiazem (CARDIZEM CD) 240 MG 24 hr capsule Take 1 capsule (240 mg total) by mouth daily. 08/05/15  Yes Scott T Kathlen Mody, PA-C  flecainide (TAMBOCOR) 50 MG tablet TAKE 1 TABLET (50 MG TOTAL) BY MOUTH 2 (TWO) TIMES DAILY. 12/12/15  Yes Sherren Mocha, MD  levothyroxine (SYNTHROID, LEVOTHROID) 100 MCG tablet Take 100 mcg by mouth daily.  04/22/15  Yes Historical Provider, MD  ondansetron (ZOFRAN-ODT) 8 MG disintegrating tablet Take 8 mg by mouth every 8 (eight) hours as needed for nausea or vomiting.   Yes Historical Provider, MD  prochlorperazine (COMPAZINE) 10 MG tablet Take 10 mg by mouth every 6 (six) hours as needed for nausea or vomiting.   Yes Historical Provider, MD  lidocaine-prilocaine (EMLA) cream Apply 1 application topically as needed. Apply to port a cath site one hour prior to needle stick 02/07/15   [name], MD  loratadine (CLARITIN) 10 MG tablet Take 10 mg by mouth daily as needed for allergies.     Historical Provider, MD   BP 123/76 mmHg  Pulse 70  Temp(Src) 98.5 F (36.9 C) (Oral)  Resp 16  Ht '5\' 1"'  (1.549 m)  Wt 144 lb (65.318 kg)  BMI 27.22 kg/m2  SpO2 92% Physical Exam  Constitutional: She is oriented to person, place, and time. She appears well-developed and well-nourished.  HENT:  Head: Normocephalic and atraumatic.  Right Ear: External ear normal.  Left Ear: External ear normal.  Mouth/Throat: Oropharynx is clear and moist.  Eyes: Conjunctivae are normal. Pupils are equal, round, and reactive to light.  Neck: Normal range of motion. Neck supple.  Cardiovascular: Normal rate, regular rhythm, normal heart sounds and intact distal pulses.   Pulmonary/Chest: Effort normal and breath sounds normal.  Abdominal: Soft. Bowel sounds are normal.  Musculoskeletal: Normal range of motion.  Neurological: She is alert and oriented to person, place, and time.  Skin: Skin is warm and dry.  Psychiatric: She has a normal mood and affect. Her behavior is normal. Judgment and thought content normal.  Nursing note and vitals reviewed.   ED Course  Procedures (including critical care time) Labs Review Labs Reviewed  BASIC METABOLIC PANEL - Abnormal; Notable for the following:    Glucose, Bld 141 (*)    BUN 25 (*)    Creatinine, Ser 1.26 (*)    GFR calc non Af Amer 41 (*)    GFR calc Af Amer 48 (*)     All other components within normal limits  CBC WITH DIFFERENTIAL/PLATELET - Abnormal; Notable for the following:    Lymphs Abs 0.4 (*)    All other components within normal limits  TROPONIN I - Abnormal; Notable for the following:    Troponin I 0.04 (*)    All other components within normal limits  BRAIN NATRIURETIC PEPTIDE  URINALYSIS, ROUTINE W REFLEX MICROSCOPIC (NOT AT [facility])  TSH  I-STAT CG4 LACTIC ACID, ED    Imaging Review Dg Chest 2 View  02/18/2016  CLINICAL DATA:  Weakness and body aches beginning today. Atrial fibrillation. Shortness breath. Follicular lymphoma. EXAM: CHEST  2 VIEW COMPARISON:  06/25/2015 FINDINGS: Right-sided power port remains in appropriate position. Borderline cardiomegaly remains stable. No evidence of pulmonary infiltrate or edema. No evidence of pneumothorax or pleural effusion. IMPRESSION: Stable borderline cardiomegaly.  No active lung disease. Electronically Signed   By: Earle Gell M.D.   On: 02/18/2016 19:45   I have personally reviewed and evaluated these images and lab results as part of my medical decision-making.   EKG Interpretation None    EKG NOT COMING THROUGH ON MUSE HR 67 NO ST/T WAVE CHANGES NO STEMI  MDM  PT FEELING BETTER.  SHE IS ABLE TO AMBULATE W/O DIFFICULTY.  SHE IS TOLERATING POS. Final diagnoses:  Dehydration        Isla Pence, MD 02/18/16 2209

## 2016-03-01 ENCOUNTER — Other Ambulatory Visit: Payer: Self-pay

## 2016-03-01 DIAGNOSIS — Z95828 Presence of other vascular implants and grafts: Secondary | ICD-10-CM | POA: Insufficient documentation

## 2016-03-02 ENCOUNTER — Ambulatory Visit (HOSPITAL_BASED_OUTPATIENT_CLINIC_OR_DEPARTMENT_OTHER): Payer: Medicare HMO | Admitting: Hematology and Oncology

## 2016-03-02 ENCOUNTER — Encounter: Payer: Self-pay | Admitting: *Deleted

## 2016-03-02 ENCOUNTER — Encounter: Payer: Self-pay | Admitting: Hematology and Oncology

## 2016-03-02 ENCOUNTER — Ambulatory Visit: Payer: Medicare HMO

## 2016-03-02 ENCOUNTER — Ambulatory Visit (HOSPITAL_BASED_OUTPATIENT_CLINIC_OR_DEPARTMENT_OTHER): Payer: Medicare HMO

## 2016-03-02 ENCOUNTER — Other Ambulatory Visit (HOSPITAL_BASED_OUTPATIENT_CLINIC_OR_DEPARTMENT_OTHER): Payer: Medicare HMO

## 2016-03-02 ENCOUNTER — Other Ambulatory Visit: Payer: Self-pay | Admitting: Hematology and Oncology

## 2016-03-02 VITALS — BP 122/77 | HR 69 | Temp 97.5°F | Resp 18 | Ht 61.0 in | Wt 142.3 lb

## 2016-03-02 VITALS — BP 142/70 | HR 66 | Temp 98.3°F | Resp 20

## 2016-03-02 DIAGNOSIS — N183 Chronic kidney disease, stage 3 unspecified: Secondary | ICD-10-CM

## 2016-03-02 DIAGNOSIS — C8238 Follicular lymphoma grade IIIa, lymph nodes of multiple sites: Secondary | ICD-10-CM

## 2016-03-02 DIAGNOSIS — I482 Chronic atrial fibrillation, unspecified: Secondary | ICD-10-CM

## 2016-03-02 DIAGNOSIS — Z95828 Presence of other vascular implants and grafts: Secondary | ICD-10-CM

## 2016-03-02 DIAGNOSIS — Z515 Encounter for palliative care: Secondary | ICD-10-CM | POA: Insufficient documentation

## 2016-03-02 DIAGNOSIS — C8230 Follicular lymphoma grade IIIa, unspecified site: Secondary | ICD-10-CM

## 2016-03-02 DIAGNOSIS — Z5112 Encounter for antineoplastic immunotherapy: Secondary | ICD-10-CM

## 2016-03-02 LAB — COMPREHENSIVE METABOLIC PANEL
ALT: 12 U/L (ref 0–55)
AST: 14 U/L (ref 5–34)
Albumin: 3.9 g/dL (ref 3.5–5.0)
Alkaline Phosphatase: 88 U/L (ref 40–150)
Anion Gap: 8 mEq/L (ref 3–11)
BUN: 22.1 mg/dL (ref 7.0–26.0)
CO2: 27 mEq/L (ref 22–29)
Calcium: 9.6 mg/dL (ref 8.4–10.4)
Chloride: 107 mEq/L (ref 98–109)
Creatinine: 1.3 mg/dL — ABNORMAL HIGH (ref 0.6–1.1)
EGFR: 42 mL/min/{1.73_m2} — ABNORMAL LOW (ref >90)
Glucose: 115 mg/dl (ref 70–140)
Potassium: 4.3 mEq/L (ref 3.5–5.1)
Sodium: 142 mEq/L (ref 136–145)
Total Bilirubin: 0.43 mg/dL (ref 0.20–1.20)
Total Protein: 6.7 g/dL (ref 6.4–8.3)

## 2016-03-02 LAB — CBC WITH DIFFERENTIAL/PLATELET
BASO%: 0.3 % (ref 0.0–2.0)
Basophils Absolute: 0 10*3/uL (ref 0.0–0.1)
EOS%: 2.4 % (ref 0.0–7.0)
Eosinophils Absolute: 0.2 10*3/uL (ref 0.0–0.5)
HCT: 41.6 % (ref 34.8–46.6)
HGB: 13.6 g/dL (ref 11.6–15.9)
LYMPH%: 12.4 % — ABNORMAL LOW (ref 14.0–49.7)
MCH: 29.8 pg (ref 25.1–34.0)
MCHC: 32.7 g/dL (ref 31.5–36.0)
MCV: 91.2 fL (ref 79.5–101.0)
MONO#: 0.6 10*3/uL (ref 0.1–0.9)
MONO%: 8.1 % (ref 0.0–14.0)
NEUT#: 5.4 10*3/uL (ref 1.5–6.5)
NEUT%: 76.8 % (ref 38.4–76.8)
Platelets: 265 10*3/uL (ref 145–400)
RBC: 4.56 10*6/uL (ref 3.70–5.45)
RDW: 13.3 % (ref 11.2–14.5)
WBC: 7 10*3/uL (ref 3.9–10.3)
lymph#: 0.9 10*3/uL (ref 0.9–3.3)

## 2016-03-02 MED ORDER — SODIUM CHLORIDE 0.9 % IJ SOLN
10.0000 mL | INTRAMUSCULAR | Status: DC | PRN
  Administered 2016-03-02: 10 mL via INTRAVENOUS
  Filled 2016-03-02: qty 10

## 2016-03-02 MED ORDER — DIPHENHYDRAMINE HCL 25 MG PO CAPS
50.0000 mg | ORAL_CAPSULE | Freq: Once | ORAL | Status: AC
  Administered 2016-03-02: 50 mg via ORAL

## 2016-03-02 MED ORDER — HEPARIN SOD (PORK) LOCK FLUSH 100 UNIT/ML IV SOLN
500.0000 [IU] | Freq: Once | INTRAVENOUS | Status: AC | PRN
  Administered 2016-03-02: 500 [IU]
  Filled 2016-03-02: qty 5

## 2016-03-02 MED ORDER — SODIUM CHLORIDE 0.9 % IV SOLN
375.0000 mg/m2 | Freq: Once | INTRAVENOUS | Status: AC
  Administered 2016-03-02: 600 mg via INTRAVENOUS
  Filled 2016-03-02: qty 50

## 2016-03-02 MED ORDER — DIPHENHYDRAMINE HCL 25 MG PO CAPS
ORAL_CAPSULE | ORAL | Status: AC
  Filled 2016-03-02: qty 2

## 2016-03-02 MED ORDER — SODIUM CHLORIDE 0.9 % IV SOLN
Freq: Once | INTRAVENOUS | Status: AC
  Administered 2016-03-02: 11:00:00 via INTRAVENOUS

## 2016-03-02 MED ORDER — ACETAMINOPHEN 325 MG PO TABS
ORAL_TABLET | ORAL | Status: AC
  Filled 2016-03-02: qty 2

## 2016-03-02 MED ORDER — ACETAMINOPHEN 325 MG PO TABS
650.0000 mg | ORAL_TABLET | Freq: Once | ORAL | Status: AC
  Administered 2016-03-02: 650 mg via ORAL

## 2016-03-02 MED ORDER — SODIUM CHLORIDE 0.9 % IJ SOLN
10.0000 mL | INTRAMUSCULAR | Status: DC | PRN
  Administered 2016-03-02: 10 mL
  Filled 2016-03-02: qty 10

## 2016-03-02 NOTE — Progress Notes (Signed)
[place] OFFICE PROGRESS NOTE  Patient Care Team: Arvella Nigh, MD as PCP - General (Obstetrics and Gynecology) Carola Frost, RN as Registered Nurse (Medical Oncology) [name] as Attending Physician (Internal Medicine)  SUMMARY OF ONCOLOGIC HISTORY:   Grade 3a follicular lymphoma of lymph nodes of multiple regions [facility]   01/21/2015 Surgery She underwent excisional lymph node biopsy that came back follicular lymphoma grade 3   01/21/2015 Pathology Results Accession: [id number] biopsy confirmed follicular lymphoma   2/53/6644 Imaging Echocardiogram showed ejection fraction of 55-60%   02/11/2015 Imaging  PET CT scan show possible splenic involvement and diffuse lymphadenopathy throughout   02/11/2015 Bone Marrow Biopsy  bone marrow biopsy was performed and is involved by lymphoma with translocation of igH/BCL2   02/13/2015 Procedure She had port placement.   02/17/2015 - 06/02/2015 Chemotherapy She received R-CHOP chemo x 6   02/17/2015 Adverse Reaction She had mild infusion reaction with cycle 1 of treatment.   04/18/2015 Imaging PET CT scan showed near complete response to treatment.   04/22/2015 Adverse Reaction Vincristine dose was reduced by 50% due to neuropathy from cycle 4 onwards   06/25/2015 - 07/06/2015 Hospital Admission She was hospitalized for recent sepsis/bacteremia and a fib with RVR   07/11/2015 Imaging repeat PEt scan showed complete response to Rx   07/14/2015 -  Chemotherapy She received maintenance Rituximab every 60 days   12/15/2015 Imaging PET CT showed no evidence of cancer recurrence    INTERVAL HISTORY: Please see below for problem oriented charting. She is seen prior to cycle 5 of rituximab. She was recently admitted for dehydration. She complains of feeling fatigue but denies chest pain. She has no shortness of breath on minimal exertion. She complained of knee pain. The patient denies any recent signs or symptoms of bleeding such as spontaneous  epistaxis, hematuria or hematochezia.  REVIEW OF SYSTEMS:   Constitutional: Denies fevers, chills or abnormal weight loss Eyes: Denies blurriness of vision Ears, nose, mouth, throat, and face: Denies mucositis or sore throat Cardiovascular: Denies palpitation, chest discomfort or lower extremity swelling Gastrointestinal:  Denies nausea, heartburn or change in bowel habits Skin: Denies abnormal skin rashes Lymphatics: Denies new lymphadenopathy or easy bruising Neurological:Denies numbness, tingling or new weaknesses Behavioral/Psych: Mood is stable, no new changes  All other systems were reviewed with the patient and are negative.  I have reviewed the past medical history, past surgical history, social history and family history with the patient and they are unchanged from previous note.  ALLERGIES:  has No Known Allergies.  MEDICATIONS:  Current Outpatient Prescriptions  Medication Sig Dispense Refill  . acetaminophen (TYLENOL) 325 MG tablet Take 325 mg by mouth every 6 (six) hours as needed for moderate pain.     Marland Kitchen apixaban (ELIQUIS) 5 MG TABS tablet Take 1 tablet (5 mg total) by mouth 2 (two) times daily. 60 tablet 5  . calcium carbonate (TUMS) 500 MG chewable tablet Chew 2 tablets (400 mg of elemental calcium total) by mouth daily. 2 tablet 0  . diltiazem (CARDIZEM CD) 240 MG 24 hr capsule Take 1 capsule (240 mg total) by mouth daily. 30 capsule 5  . flecainide (TAMBOCOR) 50 MG tablet TAKE 1 TABLET (50 MG TOTAL) BY MOUTH 2 (TWO) TIMES DAILY. 60 tablet 9  . furosemide (LASIX) 20 MG tablet Take 20 mg by mouth daily.    Marland Kitchen levothyroxine (SYNTHROID, LEVOTHROID) 100 MCG tablet Take 100 mcg by mouth daily.  9  . lidocaine-prilocaine (EMLA) cream  Apply 1 application topically as needed. Apply to port a cath site one hour prior to needle stick 30 g 2  . loratadine (CLARITIN) 10 MG tablet Take 10 mg by mouth daily as needed for allergies.     Marland Kitchen ondansetron (ZOFRAN-ODT) 8 MG disintegrating  tablet Take 8 mg by mouth every 8 (eight) hours as needed for nausea or vomiting.    . prochlorperazine (COMPAZINE) 10 MG tablet Take 10 mg by mouth every 6 (six) hours as needed for nausea or vomiting.    Marland Kitchen amoxicillin (AMOXIL) 500 MG tablet Take 500 mg by mouth as directed. Reported on 03/02/2016     No current facility-administered medications for this visit.   Facility-Administered Medications Ordered in Other Visits  Medication Dose Route Frequency Provider Last Rate Last Dose  . heparin lock flush 100 unit/mL  500 Units Intracatheter Once PRN [name], MD      . ondansetron (ZOFRAN) 8 mg in sodium chloride 0.9 % 50 mL IVPB   Intravenous Once [name], MD      . sodium chloride 0.9 % injection 10 mL  10 mL Intracatheter PRN [name], MD        PHYSICAL EXAMINATION: ECOG PERFORMANCE STATUS: 1 - Symptomatic but completely ambulatory  Filed Vitals:   03/02/16 1016  BP: 122/77  Pulse: 69  Temp: 97.5 F (36.4 C)  Resp: 18   Filed Weights   03/02/16 1016  Weight: 142 lb 4.8 oz (64.547 kg)    GENERAL:alert, no distress and comfortable SKIN: skin color, texture, turgor are normal, no rashes or significant lesions EYES: normal, Conjunctiva are pink and non-injected, sclera clear OROPHARYNX:no exudate, no erythema and lips, buccal mucosa, and tongue normal  NECK: supple, thyroid normal size, non-tender, without nodularity LYMPH:  no palpable lymphadenopathy in the cervical, axillary or inguinal LUNGS: clear to auscultation and percussion with normal breathing effort HEART: regular rate & rhythm and no murmurs and no lower extremity edema ABDOMEN:abdomen soft, non-tender and normal bowel sounds Musculoskeletal:no cyanosis of digits and no clubbing  NEURO: alert & oriented x 3 with fluent speech, no focal motor/sensory deficits  LABORATORY DATA:  I have reviewed the data as listed    Component Value Date/Time   NA 142 03/02/2016 0926   NA 137 02/18/2016 1912   K 4.3  03/02/2016 0926   K 4.1 02/18/2016 1912   CL 103 02/18/2016 1912   CO2 27 03/02/2016 0926   CO2 25 02/18/2016 1912   GLUCOSE 115 03/02/2016 0926   GLUCOSE 141* 02/18/2016 1912   BUN 22.1 03/02/2016 0926   BUN 25* 02/18/2016 1912   CREATININE 1.3* 03/02/2016 0926   CREATININE 1.26* 02/18/2016 1912   CREATININE 1.26* 10/22/2015 0744   CALCIUM 9.6 03/02/2016 0926   CALCIUM 9.2 02/18/2016 1912   PROT 6.7 03/02/2016 0926   PROT 6.2* 06/25/2015 1215   ALBUMIN 3.9 03/02/2016 0926   ALBUMIN 2.8* 06/25/2015 1215   AST 14 03/02/2016 0926   AST 157* 06/25/2015 1215   ALT 12 03/02/2016 0926   ALT 106* 06/25/2015 1215   ALKPHOS 88 03/02/2016 0926   ALKPHOS 106 06/25/2015 1215   BILITOT 0.43 03/02/2016 0926   BILITOT 0.7 06/25/2015 1215   GFRNONAA 41* 02/18/2016 1912   GFRAA 48* 02/18/2016 1912    No results found for: SPEP, UPEP  Lab Results  Component Value Date   WBC 7.0 03/02/2016   NEUTROABS 5.4 03/02/2016   HGB 13.6 03/02/2016   HCT 41.6 03/02/2016  MCV 91.2 03/02/2016   PLT 265 03/02/2016      Chemistry      Component Value Date/Time   NA 142 03/02/2016 0926   NA 137 02/18/2016 1912   K 4.3 03/02/2016 0926   K 4.1 02/18/2016 1912   CL 103 02/18/2016 1912   CO2 27 03/02/2016 0926   CO2 25 02/18/2016 1912   BUN 22.1 03/02/2016 0926   BUN 25* 02/18/2016 1912   CREATININE 1.3* 03/02/2016 0926   CREATININE 1.26* 02/18/2016 1912   CREATININE 1.26* 10/22/2015 0744      Component Value Date/Time   CALCIUM 9.6 03/02/2016 0926   CALCIUM 9.2 02/18/2016 1912   ALKPHOS 88 03/02/2016 0926   ALKPHOS 106 06/25/2015 1215   AST 14 03/02/2016 0926   AST 157* 06/25/2015 1215   ALT 12 03/02/2016 0926   ALT 106* 06/25/2015 1215   BILITOT 0.43 03/02/2016 0926   BILITOT 0.7 06/25/2015 1215      ASSESSMENT & PLAN:  Grade 3a follicular lymphoma of lymph nodes of multiple regions [facility]) Recent PET CT scan in February 2017 showed no evidence of disease. We will continue  rituximab every 60 days for 2 years. She is not due for another imaging study until February 2018  Chronic kidney disease, stage III (moderate) She has chronic kidney disease stage III.  This is likely related to cardiac issues Serum creatinine is stable. Continue medical management  Chronic atrial fibrillation ([place]) She is currently rate controlled. She will continue medical management She is on chronic anticoagulation therapy. We'll monitor creatinine clearance carefully.    Quality of life palliative care encounter We discussed increase physical activity and possible enrollment with the [employer] program with the [facility]. I gave her additional resources from the [place]. We discussed importance of vitamin D supplementation.    No orders of the defined types were placed in this encounter.   All questions were answered. The patient knows to call the clinic with any problems, questions or concerns. No barriers to learning was detected. I spent 20 minutes counseling the patient face to face. The total time spent in the appointment was 30 minutes and more than 50% was on counseling and review of test results     [facility], [place], MD 03/02/2016 12:24 PM

## 2016-03-02 NOTE — Assessment & Plan Note (Signed)
She has chronic kidney disease stage III.  This is likely related to cardiac issues Serum creatinine is stable. Continue medical management 

## 2016-03-02 NOTE — Patient Instructions (Signed)

## 2016-03-02 NOTE — Assessment & Plan Note (Addendum)
Recent PET CT scan in February 2017 showed no evidence of disease. We will continue rituximab every 60 days for 2 years. She is not due for another imaging study until February 2018

## 2016-03-02 NOTE — Progress Notes (Signed)
03/02/2016 1005 12 month PREVENT visit The patient returns to the clinic for follow-up.  She reports that she is doing well. She is not on study medication.   She completed the 12 month neurocog. booklet today without any difficulty. She is receiving rituximab every 60 days for 2 years. She understands she will be due again in 6 months for the 18 month visit.  She was without any questions. This RN thanked the patient for her time and commitment to the study. [name], RN, BSN, MHA, OCN

## 2016-03-02 NOTE — Assessment & Plan Note (Signed)
She is currently rate controlled. She will continue medical management She is on chronic anticoagulation therapy. We'll monitor creatinine clearance carefully.

## 2016-03-02 NOTE — Patient Instructions (Signed)
[facility] Discharge Instructions for Patients Receiving Chemotherapy  Today you received the following chemotherapy agents:  Rituxan.  To help prevent nausea and vomiting after your treatment, we encourage you to take your nausea medication as ordered per MD.   If you develop nausea and vomiting that is not controlled by your nausea medication, call the clinic.   BELOW ARE SYMPTOMS THAT SHOULD BE REPORTED IMMEDIATELY:  *FEVER GREATER THAN 100.5 F  *CHILLS WITH OR WITHOUT FEVER  NAUSEA AND VOMITING THAT IS NOT CONTROLLED WITH YOUR NAUSEA MEDICATION  *UNUSUAL SHORTNESS OF BREATH  *UNUSUAL BRUISING OR BLEEDING  TENDERNESS IN MOUTH AND THROAT WITH OR WITHOUT PRESENCE OF ULCERS  *URINARY PROBLEMS  *BOWEL PROBLEMS  UNUSUAL RASH Items with * indicate a potential emergency and should be followed up as soon as possible.  Feel free to call the clinic you have any questions or concerns. The clinic phone number is [phone].  Please show the CHEMO ALERT CARD at check-in to the Emergency Department and triage nurse.   

## 2016-03-02 NOTE — Assessment & Plan Note (Signed)
We discussed increase physical activity and possible enrollment with the [employer] program with the [employer]. I gave her additional resources from the [employer]. We discussed importance of vitamin D supplementation.  

## 2016-04-22 ENCOUNTER — Other Ambulatory Visit: Payer: Self-pay | Admitting: *Deleted

## 2016-04-30 ENCOUNTER — Other Ambulatory Visit: Payer: Self-pay | Admitting: Hematology and Oncology

## 2016-04-30 DIAGNOSIS — C8238 Follicular lymphoma grade IIIa, lymph nodes of multiple sites: Secondary | ICD-10-CM

## 2016-05-03 ENCOUNTER — Encounter: Payer: Self-pay | Admitting: Hematology and Oncology

## 2016-05-03 ENCOUNTER — Ambulatory Visit (HOSPITAL_BASED_OUTPATIENT_CLINIC_OR_DEPARTMENT_OTHER): Payer: Medicare HMO

## 2016-05-03 ENCOUNTER — Other Ambulatory Visit (HOSPITAL_BASED_OUTPATIENT_CLINIC_OR_DEPARTMENT_OTHER): Payer: Medicare HMO

## 2016-05-03 ENCOUNTER — Telehealth: Payer: Self-pay | Admitting: Hematology and Oncology

## 2016-05-03 ENCOUNTER — Ambulatory Visit (HOSPITAL_BASED_OUTPATIENT_CLINIC_OR_DEPARTMENT_OTHER): Payer: Medicare HMO | Admitting: Hematology and Oncology

## 2016-05-03 ENCOUNTER — Ambulatory Visit: Payer: Medicare HMO

## 2016-05-03 VITALS — BP 140/77 | HR 71 | Temp 99.5°F | Resp 18

## 2016-05-03 VITALS — BP 124/88 | HR 77 | Temp 97.6°F | Resp 18 | Ht 61.0 in | Wt 143.2 lb

## 2016-05-03 DIAGNOSIS — N183 Chronic kidney disease, stage 3 unspecified: Secondary | ICD-10-CM

## 2016-05-03 DIAGNOSIS — C8238 Follicular lymphoma grade IIIa, lymph nodes of multiple sites: Secondary | ICD-10-CM | POA: Diagnosis not present

## 2016-05-03 DIAGNOSIS — I482 Chronic atrial fibrillation, unspecified: Secondary | ICD-10-CM

## 2016-05-03 DIAGNOSIS — Z5112 Encounter for antineoplastic immunotherapy: Secondary | ICD-10-CM

## 2016-05-03 DIAGNOSIS — Z95828 Presence of other vascular implants and grafts: Secondary | ICD-10-CM

## 2016-05-03 LAB — CBC WITH DIFFERENTIAL/PLATELET
BASO%: 0.7 % (ref 0.0–2.0)
Basophils Absolute: 0 10*3/uL (ref 0.0–0.1)
EOS%: 2.7 % (ref 0.0–7.0)
Eosinophils Absolute: 0.2 10*3/uL (ref 0.0–0.5)
HCT: 40.4 % (ref 34.8–46.6)
HGB: 13.3 g/dL (ref 11.6–15.9)
LYMPH%: 10.9 % — ABNORMAL LOW (ref 14.0–49.7)
MCH: 29.7 pg (ref 25.1–34.0)
MCHC: 32.9 g/dL (ref 31.5–36.0)
MCV: 90.2 fL (ref 79.5–101.0)
MONO#: 0.6 10*3/uL (ref 0.1–0.9)
MONO%: 9.3 % (ref 0.0–14.0)
NEUT#: 4.9 10*3/uL (ref 1.5–6.5)
NEUT%: 76.4 % (ref 38.4–76.8)
Platelets: 229 10*3/uL (ref 145–400)
RBC: 4.47 10*6/uL (ref 3.70–5.45)
RDW: 14.2 % (ref 11.2–14.5)
WBC: 6.5 10*3/uL (ref 3.9–10.3)
lymph#: 0.7 10*3/uL — ABNORMAL LOW (ref 0.9–3.3)

## 2016-05-03 LAB — LACTATE DEHYDROGENASE: LDH: 215 U/L (ref 125–245)

## 2016-05-03 LAB — COMPREHENSIVE METABOLIC PANEL
ALT: 11 U/L (ref 0–55)
AST: 13 U/L (ref 5–34)
Albumin: 3.8 g/dL (ref 3.5–5.0)
Alkaline Phosphatase: 101 U/L (ref 40–150)
Anion Gap: 10 mEq/L (ref 3–11)
BUN: 27.4 mg/dL — ABNORMAL HIGH (ref 7.0–26.0)
CO2: 25 mEq/L (ref 22–29)
Calcium: 9.2 mg/dL (ref 8.4–10.4)
Chloride: 108 mEq/L (ref 98–109)
Creatinine: 1.2 mg/dL — ABNORMAL HIGH (ref 0.6–1.1)
EGFR: 45 mL/min/{1.73_m2} — ABNORMAL LOW (ref >90)
Glucose: 119 mg/dl (ref 70–140)
Potassium: 3.9 mEq/L (ref 3.5–5.1)
Sodium: 143 mEq/L (ref 136–145)
Total Bilirubin: 0.53 mg/dL (ref 0.20–1.20)
Total Protein: 6.9 g/dL (ref 6.4–8.3)

## 2016-05-03 MED ORDER — SODIUM CHLORIDE 0.9 % IJ SOLN
10.0000 mL | INTRAMUSCULAR | Status: DC | PRN
  Administered 2016-05-03: 10 mL via INTRAVENOUS
  Filled 2016-05-03: qty 10

## 2016-05-03 MED ORDER — SODIUM CHLORIDE 0.9 % IV SOLN
375.0000 mg/m2 | Freq: Once | INTRAVENOUS | Status: AC
  Administered 2016-05-03: 600 mg via INTRAVENOUS
  Filled 2016-05-03: qty 50

## 2016-05-03 MED ORDER — DIPHENHYDRAMINE HCL 25 MG PO CAPS
50.0000 mg | ORAL_CAPSULE | Freq: Once | ORAL | Status: AC
  Administered 2016-05-03: 50 mg via ORAL

## 2016-05-03 MED ORDER — SODIUM CHLORIDE 0.9 % IV SOLN
Freq: Once | INTRAVENOUS | Status: AC
  Administered 2016-05-03: 10:00:00 via INTRAVENOUS

## 2016-05-03 MED ORDER — ACETAMINOPHEN 325 MG PO TABS
650.0000 mg | ORAL_TABLET | Freq: Once | ORAL | Status: AC
  Administered 2016-05-03: 650 mg via ORAL

## 2016-05-03 MED ORDER — HEPARIN SOD (PORK) LOCK FLUSH 100 UNIT/ML IV SOLN
500.0000 [IU] | Freq: Once | INTRAVENOUS | Status: AC | PRN
  Administered 2016-05-03: 500 [IU]
  Filled 2016-05-03: qty 5

## 2016-05-03 MED ORDER — SODIUM CHLORIDE 0.9 % IJ SOLN
10.0000 mL | INTRAMUSCULAR | Status: DC | PRN
  Administered 2016-05-03: 10 mL
  Filled 2016-05-03: qty 10

## 2016-05-03 MED ORDER — DIPHENHYDRAMINE HCL 25 MG PO CAPS
ORAL_CAPSULE | ORAL | Status: AC
  Filled 2016-05-03: qty 2

## 2016-05-03 MED ORDER — ACETAMINOPHEN 325 MG PO TABS
ORAL_TABLET | ORAL | Status: AC
  Filled 2016-05-03: qty 2

## 2016-05-03 NOTE — Assessment & Plan Note (Signed)
She is currently rate controlled. She will continue medical management She is on chronic anticoagulation therapy. We'll monitor creatinine clearance carefully.

## 2016-05-03 NOTE — Patient Instructions (Signed)

## 2016-05-03 NOTE — Assessment & Plan Note (Signed)
She has chronic kidney disease stage III.  This is likely related to cardiac issues Serum creatinine is stable. Continue medical management 

## 2016-05-03 NOTE — Telephone Encounter (Signed)
Gave pt cal & avs °

## 2016-05-03 NOTE — Progress Notes (Signed)
[place] OFFICE PROGRESS NOTE  Patient Care Team: Arvella Nigh, MD as PCP - General (Obstetrics and Gynecology) Carola Frost, RN as Registered Nurse (Medical Oncology) [name], MD as Attending Physician (Internal Medicine)  SUMMARY OF ONCOLOGIC HISTORY:   Grade 3a follicular lymphoma of lymph nodes of multiple regions [facility])   01/21/2015 Surgery She underwent excisional lymph node biopsy that came back follicular lymphoma grade 3   01/21/2015 Pathology Results Accession: [id number] biopsy confirmed follicular lymphoma   0/05/6760 Imaging Echocardiogram showed ejection fraction of 55-60%   02/11/2015 Imaging  PET CT scan show possible splenic involvement and diffuse lymphadenopathy throughout   02/11/2015 Bone Marrow Biopsy  bone marrow biopsy was performed and is involved by lymphoma with translocation of igH/BCL2   02/13/2015 Procedure She had port placement.   02/17/2015 - 06/02/2015 Chemotherapy She received R-CHOP chemo x 6   02/17/2015 Adverse Reaction She had mild infusion reaction with cycle 1 of treatment.   04/18/2015 Imaging PET CT scan showed near complete response to treatment.   04/22/2015 Adverse Reaction Vincristine dose was reduced by 50% due to neuropathy from cycle 4 onwards   06/25/2015 - 07/06/2015 Hospital Admission She was hospitalized for recent sepsis/bacteremia and a fib with RVR   07/11/2015 Imaging repeat PEt scan showed complete response to Rx   07/14/2015 -  Chemotherapy She received maintenance Rituximab every 60 days   12/15/2015 Imaging PET CT showed no evidence of cancer recurrence    INTERVAL HISTORY: Please see below for problem oriented charting. She returns for treatment with rituximab. No new lymphadenopathy. Denies recent infection. She is doing well with her other medications for her atrial fibrillation. The patient denies any recent signs or symptoms of bleeding such as spontaneous epistaxis, hematuria or hematochezia.   REVIEW OF  SYSTEMS:   Constitutional: Denies fevers, chills or abnormal weight loss Eyes: Denies blurriness of vision Ears, nose, mouth, throat, and face: Denies mucositis or sore throat Respiratory: Denies cough, dyspnea or wheezes Cardiovascular: Denies palpitation, chest discomfort or lower extremity swelling Gastrointestinal:  Denies nausea, heartburn or change in bowel habits Skin: Denies abnormal skin rashes Lymphatics: Denies new lymphadenopathy or easy bruising Neurological:Denies numbness, tingling or new weaknesses Behavioral/Psych: Mood is stable, no new changes  All other systems were reviewed with the patient and are negative.  I have reviewed the past medical history, past surgical history, social history and family history with the patient and they are unchanged from previous note.  ALLERGIES:  has No Known Allergies.  MEDICATIONS:  Current Outpatient Prescriptions  Medication Sig Dispense Refill  . acetaminophen (TYLENOL) 325 MG tablet Take 325 mg by mouth every 6 (six) hours as needed for moderate pain.     Marland Kitchen amoxicillin (AMOXIL) 500 MG tablet Take 500 mg by mouth as directed. Reported on 03/02/2016    . apixaban (ELIQUIS) 5 MG TABS tablet Take 1 tablet (5 mg total) by mouth 2 (two) times daily. 60 tablet 5  . calcium carbonate (TUMS) 500 MG chewable tablet Chew 2 tablets (400 mg of elemental calcium total) by mouth daily. 2 tablet 0  . diltiazem (CARDIZEM CD) 240 MG 24 hr capsule Take 1 capsule (240 mg total) by mouth daily. 30 capsule 5  . flecainide (TAMBOCOR) 50 MG tablet TAKE 1 TABLET (50 MG TOTAL) BY MOUTH 2 (TWO) TIMES DAILY. 60 tablet 9  . furosemide (LASIX) 20 MG tablet Take 20 mg by mouth daily.    Marland Kitchen levothyroxine (SYNTHROID, LEVOTHROID) 100 MCG tablet  Take 100 mcg by mouth daily.  9  . lidocaine-prilocaine (EMLA) cream Apply 1 application topically as needed. Apply to port a cath site one hour prior to needle stick 30 g 2  . loratadine (CLARITIN) 10 MG tablet Take 10 mg  by mouth daily as needed for allergies.     Marland Kitchen ondansetron (ZOFRAN-ODT) 8 MG disintegrating tablet Take 8 mg by mouth every 8 (eight) hours as needed for nausea or vomiting.    . prochlorperazine (COMPAZINE) 10 MG tablet Take 10 mg by mouth every 6 (six) hours as needed for nausea or vomiting.     No current facility-administered medications for this visit.   Facility-Administered Medications Ordered in Other Visits  Medication Dose Route Frequency Provider Last Rate Last Dose  . ondansetron (ZOFRAN) 8 mg in sodium chloride 0.9 % 50 mL IVPB   Intravenous Once [name], MD        PHYSICAL EXAMINATION: ECOG PERFORMANCE STATUS: 0 - Asymptomatic  Filed Vitals:   05/03/16 0956  BP: 124/88  Pulse: 77  Temp: 97.6 F (36.4 C)  Resp: 18   Filed Weights   05/03/16 0956  Weight: 143 lb 3.2 oz (64.955 kg)    GENERAL:alert, no distress and comfortable SKIN: skin color, texture, turgor are normal, no rashes or significant lesions EYES: normal, Conjunctiva are pink and non-injected, sclera clear OROPHARYNX:no exudate, no erythema and lips, buccal mucosa, and tongue normal  NECK: supple, thyroid normal size, non-tender, without nodularity LYMPH:  no palpable lymphadenopathy in the cervical, axillary or inguinal LUNGS: clear to auscultation and percussion with normal breathing effort HEART: regular rate & rhythm and no murmurs and no lower extremity edema ABDOMEN:abdomen soft, non-tender and normal bowel sounds Musculoskeletal:no cyanosis of digits and no clubbing  NEURO: alert & oriented x 3 with fluent speech, no focal motor/sensory deficits  LABORATORY DATA:  I have reviewed the data as listed    Component Value Date/Time   NA 142 03/02/2016 0926   NA 137 02/18/2016 1912   K 4.3 03/02/2016 0926   K 4.1 02/18/2016 1912   CL 103 02/18/2016 1912   CO2 27 03/02/2016 0926   CO2 25 02/18/2016 1912   GLUCOSE 115 03/02/2016 0926   GLUCOSE 141* 02/18/2016 1912   BUN 22.1 03/02/2016 0926    BUN 25* 02/18/2016 1912   CREATININE 1.3* 03/02/2016 0926   CREATININE 1.26* 02/18/2016 1912   CREATININE 1.26* 10/22/2015 0744   CALCIUM 9.6 03/02/2016 0926   CALCIUM 9.2 02/18/2016 1912   PROT 6.7 03/02/2016 0926   PROT 6.2* 06/25/2015 1215   ALBUMIN 3.9 03/02/2016 0926   ALBUMIN 2.8* 06/25/2015 1215   AST 14 03/02/2016 0926   AST 157* 06/25/2015 1215   ALT 12 03/02/2016 0926   ALT 106* 06/25/2015 1215   ALKPHOS 88 03/02/2016 0926   ALKPHOS 106 06/25/2015 1215   BILITOT 0.43 03/02/2016 0926   BILITOT 0.7 06/25/2015 1215   GFRNONAA 41* 02/18/2016 1912   GFRAA 48* 02/18/2016 1912    No results found for: SPEP, UPEP  Lab Results  Component Value Date   WBC 6.5 05/03/2016   NEUTROABS 4.9 05/03/2016   HGB 13.3 05/03/2016   HCT 40.4 05/03/2016   MCV 90.2 05/03/2016   PLT 229 05/03/2016      Chemistry      Component Value Date/Time   NA 142 03/02/2016 0926   NA 137 02/18/2016 1912   K 4.3 03/02/2016 0926   K 4.1 02/18/2016 1912  CL 103 02/18/2016 1912   CO2 27 03/02/2016 0926   CO2 25 02/18/2016 1912   BUN 22.1 03/02/2016 0926   BUN 25* 02/18/2016 1912   CREATININE 1.3* 03/02/2016 0926   CREATININE 1.26* 02/18/2016 1912   CREATININE 1.26* 10/22/2015 0744      Component Value Date/Time   CALCIUM 9.6 03/02/2016 0926   CALCIUM 9.2 02/18/2016 1912   ALKPHOS 88 03/02/2016 0926   ALKPHOS 106 06/25/2015 1215   AST 14 03/02/2016 0926   AST 157* 06/25/2015 1215   ALT 12 03/02/2016 0926   ALT 106* 06/25/2015 1215   BILITOT 0.43 03/02/2016 0926   BILITOT 0.7 06/25/2015 1215      ASSESSMENT & PLAN:  Grade 3a follicular lymphoma of lymph nodes of multiple regions [facility]) Recent PET CT scan in February 2017 showed no evidence of disease. We will continue rituximab every 60 days for 2 years. She is not due for another imaging study until February 2018 We will proceed with treatment today.  Chronic kidney disease, stage III (moderate) She has chronic kidney  disease stage III.  This is likely related to cardiac issues Serum creatinine is stable. Continue medical management  Chronic atrial fibrillation ([name]) She is currently rate controlled. She will continue medical management She is on chronic anticoagulation therapy. We'll monitor creatinine clearance carefully.     No orders of the defined types were placed in this encounter.   All questions were answered. The patient knows to call the clinic with any problems, questions or concerns. No barriers to learning was detected. I spent 15 minutes counseling the patient face to face. The total time spent in the appointment was 20 minutes and more than 50% was on counseling and review of test results     [facility], [place], MD 05/03/2016 10:05 AM

## 2016-05-03 NOTE — Assessment & Plan Note (Signed)
Recent PET CT scan in February 2017 showed no evidence of disease. We will continue rituximab every 60 days for 2 years. She is not due for another imaging study until February 2018 We will proceed with treatment today.

## 2016-05-03 NOTE — Patient Instructions (Signed)
[facility] Discharge Instructions for Patients Receiving Chemotherapy  Today you received the following chemotherapy agents Rituximab.   To help prevent nausea and vomiting after your treatment, we encourage you to take your nausea medication as prescribed.   If you develop nausea and vomiting that is not controlled by your nausea medication, call the clinic.   BELOW ARE SYMPTOMS THAT SHOULD BE REPORTED IMMEDIATELY:  *FEVER GREATER THAN 100.5 F  *CHILLS WITH OR WITHOUT FEVER  NAUSEA AND VOMITING THAT IS NOT CONTROLLED WITH YOUR NAUSEA MEDICATION  *UNUSUAL SHORTNESS OF BREATH  *UNUSUAL BRUISING OR BLEEDING  TENDERNESS IN MOUTH AND THROAT WITH OR WITHOUT PRESENCE OF ULCERS  *URINARY PROBLEMS  *BOWEL PROBLEMS  UNUSUAL RASH Items with * indicate a potential emergency and should be followed up as soon as possible.  Feel free to call the clinic you have any questions or concerns. The clinic phone number is [phone].  Please show the CHEMO ALERT CARD at check-in to the Emergency Department and triage nurse.   

## 2016-05-05 ENCOUNTER — Emergency Department (HOSPITAL_COMMUNITY)
Admission: EM | Admit: 2016-05-05 | Discharge: 2016-05-05 | Disposition: A | Discharge status: Home / Self Care | Payer: Medicare HMO | Attending: Emergency Medicine | Admitting: Emergency Medicine

## 2016-05-05 ENCOUNTER — Emergency Department (HOSPITAL_COMMUNITY): Payer: Medicare HMO

## 2016-05-05 ENCOUNTER — Encounter (HOSPITAL_COMMUNITY): Payer: Self-pay | Admitting: Emergency Medicine

## 2016-05-05 ENCOUNTER — Other Ambulatory Visit: Payer: Self-pay

## 2016-05-05 DIAGNOSIS — N183 Chronic kidney disease, stage 3 (moderate): Secondary | ICD-10-CM | POA: Insufficient documentation

## 2016-05-05 DIAGNOSIS — Z7901 Long term (current) use of anticoagulants: Secondary | ICD-10-CM | POA: Insufficient documentation

## 2016-05-05 DIAGNOSIS — R079 Chest pain, unspecified: Secondary | ICD-10-CM | POA: Diagnosis not present

## 2016-05-05 DIAGNOSIS — R0602 Shortness of breath: Secondary | ICD-10-CM | POA: Diagnosis not present

## 2016-05-05 DIAGNOSIS — Z96651 Presence of right artificial knee joint: Secondary | ICD-10-CM | POA: Diagnosis not present

## 2016-05-05 HISTORY — DX: Unspecified atrial fibrillation: I48.91

## 2016-05-05 LAB — BASIC METABOLIC PANEL
Anion gap: 11 (ref 5–15)
BUN: 24 mg/dL — ABNORMAL HIGH (ref 6–20)
CO2: 23 mmol/L (ref 22–32)
Calcium: 9 mg/dL (ref 8.9–10.3)
Chloride: 105 mmol/L (ref 101–111)
Creatinine, Ser: 1.16 mg/dL — ABNORMAL HIGH (ref 0.44–1.00)
GFR calc Af Amer: 53 mL/min — ABNORMAL LOW (ref >60)
GFR calc non Af Amer: 46 mL/min — ABNORMAL LOW (ref >60)
Glucose, Bld: 123 mg/dL — ABNORMAL HIGH (ref 65–99)
Potassium: 4 mmol/L (ref 3.5–5.1)
Sodium: 139 mmol/L (ref 135–145)

## 2016-05-05 LAB — I-STAT TROPONIN, ED
Troponin i, poc: 0.03 ng/mL (ref 0.00–0.08)
Troponin i, poc: 0 ng/mL (ref 0.00–0.08)

## 2016-05-05 LAB — CBC
HCT: 38.2 % (ref 36.0–46.0)
Hemoglobin: 12.5 g/dL (ref 12.0–15.0)
MCH: 29.6 pg (ref 26.0–34.0)
MCHC: 32.7 g/dL (ref 30.0–36.0)
MCV: 90.5 fL (ref 78.0–100.0)
Platelets: 240 10*3/uL (ref 150–400)
RBC: 4.22 MIL/uL (ref 3.87–5.11)
RDW: 14 % (ref 11.5–15.5)
WBC: 7.6 10*3/uL (ref 4.0–10.5)

## 2016-05-05 MED ORDER — ASPIRIN 81 MG PO CHEW
324.0000 mg | CHEWABLE_TABLET | Freq: Once | ORAL | Status: AC
  Administered 2016-05-05: 324 mg via ORAL
  Filled 2016-05-05: qty 4

## 2016-05-05 MED ORDER — IOPAMIDOL (ISOVUE-370) INJECTION 76%
INTRAVENOUS | Status: AC
  Administered 2016-05-05: 100 mL
  Filled 2016-05-05: qty 100

## 2016-05-05 MED ORDER — MORPHINE SULFATE (PF) 4 MG/ML IV SOLN
4.0000 mg | Freq: Once | INTRAVENOUS | Status: AC
  Administered 2016-05-05: 4 mg via INTRAVENOUS
  Filled 2016-05-05: qty 1

## 2016-05-05 MED ORDER — ONDANSETRON HCL 4 MG/2ML IJ SOLN
4.0000 mg | Freq: Once | INTRAMUSCULAR | Status: AC
  Administered 2016-05-05: 4 mg via INTRAVENOUS
  Filled 2016-05-05: qty 2

## 2016-05-05 NOTE — ED Notes (Signed)
Pt ambulated throughout department while monitoring O2 sat withoug oxygen.  Sat decreased to 88-89% with pt on room air.  Pt reports increase in chest pain with ambulation as well.

## 2016-05-05 NOTE — ED Notes (Signed)
Pt's O2 sat dropped into mid-80s, pt repositioned and O2 applied via nasal cannula at 2 liters with sat increasing to 98%.

## 2016-05-05 NOTE — ED Notes (Signed)
Pt ambulated to and from bathroom unassisted, gait steady, tolerated well. Upon return to room, pt is short of breath and reports increased chest pain with movement.

## 2016-05-05 NOTE — ED Provider Notes (Signed)
CSN: [id number]     Arrival date & time 05/05/16  0506 History   First MD Initiated Contact with Patient 05/05/16 [phone]     Chief Complaint  Patient presents with  . Chest Pain     (Consider location/radiation/quality/duration/timing/severity/associated sxs/prior Treatment) HPI Comments: 75 year old female with past medical history including follicular lymphoma, atrial fibrillation, CK D who presents with chest pain. Last night, the patient was getting ready for bed and noticed some soreness on her left anterior chest. She states it felt like a bruise or pulled muscle. She went to sleep and woke up this morning with worsening central chest pain. The pain is worse with palpation of her anterior chest and worse with movements, especially with raising her arm. She has had mild shortness of breath which is a chronic problem for her, no change recently. No associated nausea, vomiting, or diaphoresis. No fevers, cough/cold symptoms, urinary symptoms, diarrhea, abdominal pain, or recent illness. She has never had this pain before. She denies any trauma or change in her physical activity. She states that she is compliant with medication including Eliquis.  No FH of heart disease.  Patient is a 75 y.o. female presenting with chest pain. The history is provided by the patient.  Chest Pain   Past Medical History  Diagnosis Date  . DJD (degenerative joint disease)   . Hypothyroid   . Cervical spondylosis without myelopathy 10/25/2013  . Dental bridge present   . Follicular lymphoma grade 3a ([place]) 02/03/2015  . Pneumonia   . Anemia     "years ago"  . Nausea without vomiting 06/20/2015  . Elevated troponin 02/18/2015  . Thrush of mouth and esophagus ([place]) 06/25/2015  . Atrial fibrillation with rapid ventricular response ([place]) 06/25/2015  . Stage III chronic kidney disease 07/01/2015  . History of echocardiogram     Echo 12/16: EF 60-65%, no RWMA, severe LAE  . History of nuclear stress test     Myoview  1/17: EF 55%, Normal study. No ischemia or scar.  Marland Kitchen A-fib [facility])    Past Surgical History  Procedure Laterality Date  . Patelar tendon transplants      Left/right  . Abdominal hysterectomy    . Dilation and curettage of uterus    . Total knee arthroplasty  2011    Right  . Appendectomy    . Ovarian cyst resection    . Back surgery      X5-lumbar-fusion  . Lymph node biopsy Right 01/21/2015    Procedure: RIGHT GROIN LYMPH NODE BIOPSY;  Surgeon: Erroll Luna, MD;  Location: [facility];  Service: General;  Laterality: Right;  . Colonoscopy    . Portacath placement Right 02/13/2015    Procedure: INSERTION PORT-A-CATH WITH ULTRASOUND;  Surgeon: Erroll Luna, MD;  Location: [place];  Service: General;  Laterality: Right;   Family History  Problem Relation Age of Onset  . Cancer Mother     Breast, lung NHL  . Cancer Sister     Multiple myeloma   Social History  Substance Use Topics  . Smoking status: Never Smoker   . Smokeless tobacco: Never Used  . Alcohol Use: Yes     Comment: occassional wine   OB History    No data available     Review of Systems  Cardiovascular: Positive for chest pain.   10 Systems reviewed and are negative for acute change except as noted in the HPI.    Allergies  Review of patient's allergies indicates  no known allergies.  Home Medications   Prior to Admission medications   Medication Sig Start Date End Date Taking? Authorizing Provider  acetaminophen (TYLENOL) 325 MG tablet Take 325 mg by mouth every 6 (six) hours as needed for moderate pain.    Yes Historical Provider, MD  amoxicillin (AMOXIL) 500 MG tablet Take 2,000 mg by mouth See admin instructions. For dental appointment   Yes Historical Provider, MD  apixaban (ELIQUIS) 2.5 MG TABS tablet Take 2.5 mg by mouth 2 (two) times daily.   Yes Historical Provider, MD  calcium carbonate (TUMS) 500 MG chewable tablet Chew 2 tablets (400 mg of elemental calcium total) by mouth daily.  06/21/15  Yes Volanda Napoleon, MD  diltiazem (CARDIZEM CD) 240 MG 24 hr capsule Take 1 capsule (240 mg total) by mouth daily. 08/05/15  Yes Scott T Kathlen Mody, PA-C  flecainide (TAMBOCOR) 50 MG tablet TAKE 1 TABLET (50 MG TOTAL) BY MOUTH 2 (TWO) TIMES DAILY. 12/12/15  Yes Sherren Mocha, MD  levothyroxine (SYNTHROID, LEVOTHROID) 100 MCG tablet Take 100 mcg by mouth daily. 04/22/15  Yes Historical Provider, MD  lidocaine-prilocaine (EMLA) cream Apply 1 application topically as needed. Apply to port a cath site one hour prior to needle stick 02/07/15  Yes [name], MD  loratadine (CLARITIN) 10 MG tablet Take 10 mg by mouth daily as needed for allergies.    Yes Historical Provider, MD  ondansetron (ZOFRAN-ODT) 8 MG disintegrating tablet Take 8 mg by mouth every 8 (eight) hours as needed for nausea or vomiting.   Yes Historical Provider, MD  prochlorperazine (COMPAZINE) 10 MG tablet Take 10 mg by mouth every 6 (six) hours as needed for nausea or vomiting.   Yes Historical Provider, MD  RiTUXimab (RITUXAN IV) Inject into the vein every 8 (eight) weeks.   Yes Historical Provider, MD  apixaban (ELIQUIS) 5 MG TABS tablet Take 1 tablet (5 mg total) by mouth 2 (two) times daily. Patient not taking: Reported on 05/05/2016 08/05/15   Liliane Shi, PA-C   BP 133/79 mmHg  Pulse 70  Temp(Src) 97.7 F (36.5 C) (Oral)  Resp 18  SpO2 98% Physical Exam  Constitutional: She is oriented to person, place, and time. She appears well-developed and well-nourished. No distress.  HENT:  Head: Normocephalic and atraumatic.  Moist mucous membranes  Eyes: Conjunctivae are normal. Pupils are equal, round, and reactive to light.  Neck: Neck supple.  Cardiovascular: Normal rate, regular rhythm and normal heart sounds.   No murmur heard. Pulmonary/Chest: Effort normal and breath sounds normal. She exhibits tenderness (central and left anterior chest ttp).  Port R upper chest w/ no surrounding erythema  Abdominal: Soft. Bowel  sounds are normal. She exhibits no distension. There is no tenderness.  Musculoskeletal: She exhibits no edema.  Neurological: She is alert and oriented to person, place, and time.  Fluent speech  Skin: Skin is warm and dry. No rash noted.  No skin changes on chest  Psychiatric: She has a normal mood and affect. Judgment normal.  Nursing note and vitals reviewed.   ED Course  Procedures (including critical care time) Labs Review Labs Reviewed  BASIC METABOLIC PANEL  [place], ED    Imaging Review No results found. I have personally reviewed and evaluated these lab results as part of my medical decision-making.   EKG Interpretation   Date/Time:  Wednesday May 05 2016 05:13:58 EDT Ventricular Rate:  68 PR Interval:  210 QRS Duration: 92 QT Interval:  448 QTC Calculation: 476 R Axis:   3 Text Interpretation:  Sinus rhythm with 1st degree A-V block Anterior  infarct , age undetermined Abnormal ECG since previous tracing, A fib has  converted to sinus rhythm Poor R wave progression Confirmed by LITTLE MD,  RACHEL [phone]) on 05/05/2016 6:03:01 AM     Medications  aspirin chewable tablet 324 mg (not administered)    MDM   Final diagnoses:  None   Patient presents with central chest pain that initially felt like a soreness or bruise and was worse when she woke up. No assoc w/ exertion, worse with palpation of chest and movement of L arm. She was well-appearing on exam w/ stable VS. Tenderness of L anterior chest w/ no skin changes. Normal lung sounds. EKG without acute ischemic changes. Gave the patient aspirin and obtained above lab work including troponin.  Labwork shows creatinine of 1.16, otherwise unremarkable CBC and BMP. Initial troponin negative. Chest x-ray without any acute findings.  I considered PE but the patient is compliant on Eliquis thus I feel it is less likely, especially with description that the pain is worse w/ palpation of chest and  movement of arm. Her HEART score is 4 given her age, comorbidities, although I do feel her current description of symptoms is very atypical for ACS. I have ordered a 3 hour troponin. I am signing out to the oncoming provider who will follow-up on this lab and touch base with cardiology as pt follows in cardiology clinic.    Sharlett Iles, MD 05/05/16 [phone]

## 2016-05-05 NOTE — ED Notes (Signed)
Pt. reports central chest pain with mild SOB onset this morning , denies nausea or diaphoresis .

## 2016-05-05 NOTE — ED Provider Notes (Signed)
2 sets of troponin negative.  Pt's pain is very pleuritic.  She has been compliant with her blood thinners.  Her oxygen dropped briefly after she received morphine.  O2 sat improved.  Pt knows to return if worse. Pt has pain meds at home.  Pt's o2 sat dropped with ambulation.  I think it is from morphine, but I ordered a ct chest in case.  The ct chest had no pe.  Pt told about the adrenal mass and to f/u with pcp.  Pt ok with plan.  Isla Pence, MD 05/05/16 [phone]

## 2016-05-05 NOTE — Discharge Instructions (Signed)

## 2016-06-02 ENCOUNTER — Telehealth: Payer: Self-pay | Admitting: *Deleted

## 2016-06-02 NOTE — Telephone Encounter (Signed)
Patient calling to say she has been suffering with a pain in her back above and below her waist for a week now. [facility]. Note to dr [employer] desk

## 2016-06-02 NOTE — Telephone Encounter (Signed)
OK 

## 2016-06-03 ENCOUNTER — Telehealth: Payer: Self-pay | Admitting: *Deleted

## 2016-06-03 ENCOUNTER — Other Ambulatory Visit: Payer: Self-pay | Admitting: Hematology and Oncology

## 2016-06-03 DIAGNOSIS — C8238 Follicular lymphoma grade IIIa, lymph nodes of multiple sites: Secondary | ICD-10-CM

## 2016-06-03 NOTE — Telephone Encounter (Signed)
Pt left VM she is having "issues" and thinks she needs lab work done.  I called pt back to discuss her issues and she did not answer.  I left her a VM asking her to call nurse back as soon as possible.

## 2016-06-03 NOTE — Telephone Encounter (Signed)
Pt returned call reports she feels new swelling in right lymph nodes under her arm last night and they are painful to touch.   She continues to have back pain and waiting on the [place] to schedule MRI to evaluate this.   Notified Dr. Alvy Bimler and she ordered PET scan to be done next week.   Pt to let us know when PET is scheduled so we can schedule lab/Flush same day.   Informed pt of PET ordered to evaluate lymph nodes.  Explained lab work will not help assess this problem.  Please call us back as soon as scan is scheduled so we can add lab/flush appts.   Please allow up to 2 weeks for PET to be scheduled.  Call us back meanwhile if any worsening or new symptoms/ concerns. She verbalized understanding.

## 2016-06-08 ENCOUNTER — Telehealth: Payer: Self-pay | Admitting: *Deleted

## 2016-06-08 NOTE — Telephone Encounter (Signed)
OK, can you call MRI for a copy of the report?

## 2016-06-08 NOTE — Telephone Encounter (Signed)
Pt called to let us know her PET is scheduled for 8/29 @ 0930. F/U with Dr Alvy Bimler is 9/15  Is also having MRI today, was scheduled by PCP because of back pain, left hand tingling.  Wanted to make Dr Alvy Bimler aware. Will have results faxed to [place].

## 2016-06-15 ENCOUNTER — Encounter (HOSPITAL_COMMUNITY)
Admission: RE | Admit: 2016-06-15 | Discharge: 2016-06-15 | Disposition: A | Discharge status: Home / Self Care | Payer: Medicare HMO | Source: Ambulatory Visit | Attending: Hematology and Oncology | Admitting: Hematology and Oncology

## 2016-06-15 DIAGNOSIS — C8590 Non-Hodgkin lymphoma, unspecified, unspecified site: Secondary | ICD-10-CM | POA: Diagnosis not present

## 2016-06-15 DIAGNOSIS — C8238 Follicular lymphoma grade IIIa, lymph nodes of multiple sites: Secondary | ICD-10-CM | POA: Insufficient documentation

## 2016-06-15 LAB — GLUCOSE, CAPILLARY: Glucose-Capillary: 112 mg/dL — ABNORMAL HIGH (ref 65–99)

## 2016-06-15 MED ORDER — FLUDEOXYGLUCOSE F - 18 (FDG) INJECTION
6.9700 | Freq: Once | INTRAVENOUS | Status: AC | PRN
Start: 2016-06-15 — End: 2016-06-15
  Administered 2016-06-15: 6.97 via INTRAVENOUS

## 2016-06-17 ENCOUNTER — Telehealth: Payer: Self-pay | Admitting: *Deleted

## 2016-06-17 NOTE — Telephone Encounter (Signed)
Pt left VM asking about results of PET scan.

## 2016-06-17 NOTE — Telephone Encounter (Signed)
PET scan is normal I still have not received report of her MRI

## 2016-06-17 NOTE — Telephone Encounter (Signed)
Informed pt PET scan normal.  No signs of active Lymphoma.  She verbalized understanding.  Informed her we still have not received copy of her MRI from the [place].   Pt says she will call them again to request they fax [place] the MRI results.  She says she was told it shows "scar tissue" from previous surgeries, which may be pressing on nerves and causing her pain.   She is waiting for the [place] to refer her to Orthopedic surgeon to see if she needs any surgery.

## 2016-07-02 ENCOUNTER — Other Ambulatory Visit (HOSPITAL_BASED_OUTPATIENT_CLINIC_OR_DEPARTMENT_OTHER): Payer: Medicare HMO

## 2016-07-02 ENCOUNTER — Ambulatory Visit: Payer: Medicare HMO

## 2016-07-02 ENCOUNTER — Encounter: Payer: Self-pay | Admitting: Hematology and Oncology

## 2016-07-02 ENCOUNTER — Ambulatory Visit (HOSPITAL_BASED_OUTPATIENT_CLINIC_OR_DEPARTMENT_OTHER): Payer: Medicare HMO | Admitting: Hematology and Oncology

## 2016-07-02 ENCOUNTER — Telehealth: Payer: Self-pay | Admitting: Hematology and Oncology

## 2016-07-02 ENCOUNTER — Telehealth: Payer: Self-pay | Admitting: *Deleted

## 2016-07-02 ENCOUNTER — Ambulatory Visit (HOSPITAL_BASED_OUTPATIENT_CLINIC_OR_DEPARTMENT_OTHER): Payer: Medicare HMO

## 2016-07-02 VITALS — BP 145/69 | HR 81 | Temp 97.7°F | Resp 18

## 2016-07-02 DIAGNOSIS — C8238 Follicular lymphoma grade IIIa, lymph nodes of multiple sites: Secondary | ICD-10-CM | POA: Diagnosis not present

## 2016-07-02 DIAGNOSIS — Z5112 Encounter for antineoplastic immunotherapy: Secondary | ICD-10-CM | POA: Diagnosis not present

## 2016-07-02 DIAGNOSIS — N183 Chronic kidney disease, stage 3 unspecified: Secondary | ICD-10-CM

## 2016-07-02 DIAGNOSIS — G8929 Other chronic pain: Secondary | ICD-10-CM | POA: Diagnosis not present

## 2016-07-02 DIAGNOSIS — M549 Dorsalgia, unspecified: Secondary | ICD-10-CM

## 2016-07-02 DIAGNOSIS — Z95828 Presence of other vascular implants and grafts: Secondary | ICD-10-CM

## 2016-07-02 LAB — COMPREHENSIVE METABOLIC PANEL
ALT: 14 U/L (ref 0–55)
AST: 15 U/L (ref 5–34)
Albumin: 3.7 g/dL (ref 3.5–5.0)
Alkaline Phosphatase: 100 U/L (ref 40–150)
Anion Gap: 10 mEq/L (ref 3–11)
BUN: 29.9 mg/dL — ABNORMAL HIGH (ref 7.0–26.0)
CO2: 25 mEq/L (ref 22–29)
Calcium: 9.6 mg/dL (ref 8.4–10.4)
Chloride: 108 mEq/L (ref 98–109)
Creatinine: 1.3 mg/dL — ABNORMAL HIGH (ref 0.6–1.1)
EGFR: 40 mL/min/{1.73_m2} — ABNORMAL LOW (ref >90)
Glucose: 87 mg/dl (ref 70–140)
Potassium: 4.1 mEq/L (ref 3.5–5.1)
Sodium: 143 mEq/L (ref 136–145)
Total Bilirubin: 0.57 mg/dL (ref 0.20–1.20)
Total Protein: 6.8 g/dL (ref 6.4–8.3)

## 2016-07-02 LAB — CBC WITH DIFFERENTIAL/PLATELET
BASO%: 0.6 % (ref 0.0–2.0)
Basophils Absolute: 0 10*3/uL (ref 0.0–0.1)
EOS%: 3.7 % (ref 0.0–7.0)
Eosinophils Absolute: 0.2 10*3/uL (ref 0.0–0.5)
HCT: 40.4 % (ref 34.8–46.6)
HGB: 13.5 g/dL (ref 11.6–15.9)
LYMPH%: 11.1 % — ABNORMAL LOW (ref 14.0–49.7)
MCH: 30.2 pg (ref 25.1–34.0)
MCHC: 33.4 g/dL (ref 31.5–36.0)
MCV: 90.5 fL (ref 79.5–101.0)
MONO#: 0.6 10*3/uL (ref 0.1–0.9)
MONO%: 9.7 % (ref 0.0–14.0)
NEUT#: 4.8 10*3/uL (ref 1.5–6.5)
NEUT%: 74.9 % (ref 38.4–76.8)
Platelets: 224 10*3/uL (ref 145–400)
RBC: 4.46 10*6/uL (ref 3.70–5.45)
RDW: 13.8 % (ref 11.2–14.5)
WBC: 6.3 10*3/uL (ref 3.9–10.3)
lymph#: 0.7 10*3/uL — ABNORMAL LOW (ref 0.9–3.3)

## 2016-07-02 LAB — LACTATE DEHYDROGENASE: LDH: 188 U/L (ref 125–245)

## 2016-07-02 MED ORDER — SODIUM CHLORIDE 0.9 % IJ SOLN
10.0000 mL | INTRAMUSCULAR | Status: DC | PRN
  Administered 2016-07-02: 10 mL
  Filled 2016-07-02: qty 10

## 2016-07-02 MED ORDER — ACETAMINOPHEN 325 MG PO TABS
650.0000 mg | ORAL_TABLET | Freq: Once | ORAL | Status: AC
  Administered 2016-07-02: 650 mg via ORAL

## 2016-07-02 MED ORDER — SODIUM CHLORIDE 0.9 % IJ SOLN
10.0000 mL | INTRAMUSCULAR | Status: DC | PRN
  Administered 2016-07-02: 10 mL via INTRAVENOUS
  Filled 2016-07-02: qty 10

## 2016-07-02 MED ORDER — DIPHENHYDRAMINE HCL 25 MG PO CAPS
50.0000 mg | ORAL_CAPSULE | Freq: Once | ORAL | Status: AC
  Administered 2016-07-02: 50 mg via ORAL

## 2016-07-02 MED ORDER — ACETAMINOPHEN 325 MG PO TABS
ORAL_TABLET | ORAL | Status: AC
  Filled 2016-07-02: qty 2

## 2016-07-02 MED ORDER — RITUXIMAB CHEMO INJECTION 500 MG/50ML
375.0000 mg/m2 | Freq: Once | INTRAVENOUS | Status: AC
  Administered 2016-07-02: 600 mg via INTRAVENOUS
  Filled 2016-07-02: qty 50

## 2016-07-02 MED ORDER — HEPARIN SOD (PORK) LOCK FLUSH 100 UNIT/ML IV SOLN
500.0000 [IU] | Freq: Once | INTRAVENOUS | Status: AC | PRN
  Administered 2016-07-02: 500 [IU]
  Filled 2016-07-02: qty 5

## 2016-07-02 MED ORDER — SODIUM CHLORIDE 0.9 % IV SOLN
Freq: Once | INTRAVENOUS | Status: AC
  Administered 2016-07-02: 11:00:00 via INTRAVENOUS

## 2016-07-02 MED ORDER — DIPHENHYDRAMINE HCL 25 MG PO CAPS
ORAL_CAPSULE | ORAL | Status: AC
  Filled 2016-07-02: qty 2

## 2016-07-02 NOTE — Assessment & Plan Note (Signed)
She had recent MRI of the lumbar spine performed at the [place] which show evidence of severe canal stenosis at L4 and bilateral L3-for severe neural foraminal narrowing with multilevel facet arthosis. The patient would need physical therapy and conservative management rather than surgical management. She is taking tramadol and Aleve as needed for pain and she has prescription of pain medication patch

## 2016-07-02 NOTE — Telephone Encounter (Signed)
Message sent to chemo scheduler to add chemo. Avs report and appointment schedule given to patient, per 07/02/16 los.

## 2016-07-02 NOTE — Assessment & Plan Note (Signed)
Recent PET CT scan in August 2017 showed no evidence of disease. We will continue rituximab every 60 days for 2 years. She is not due for another imaging study until August 2018 We will proceed with treatment today. 

## 2016-07-02 NOTE — Telephone Encounter (Signed)
Per LOS I have scheduled appts and notified the scheduler 

## 2016-07-02 NOTE — Assessment & Plan Note (Signed)
She has chronic kidney disease stage III.  This is likely related to cardiac issues Serum creatinine is stable. Continue medical management 

## 2016-07-02 NOTE — Progress Notes (Signed)
[place] OFFICE PROGRESS NOTE  Patient Care Team: Arvella Nigh, MD as PCP - General (Obstetrics and Gynecology) Carola Frost, RN as Registered Nurse (Medical Oncology) [name], MD as Attending Physician (Internal Medicine)  SUMMARY OF ONCOLOGIC HISTORY:   Grade 3a follicular lymphoma of lymph nodes of multiple regions [facility])   01/21/2015 Surgery    She underwent excisional lymph node biopsy that came back follicular lymphoma grade 3      01/21/2015 Pathology Results    Accession: [id number] biopsy confirmed follicular lymphoma      02/07/2015 Imaging    Echocardiogram showed ejection fraction of 55-60%      02/11/2015 Imaging     PET CT scan show possible splenic involvement and diffuse lymphadenopathy throughout      02/11/2015 Bone Marrow Biopsy     bone marrow biopsy was performed and is involved by lymphoma with translocation of igH/BCL2      02/13/2015 Procedure    She had port placement.      02/17/2015 - 06/02/2015 Chemotherapy    She received R-CHOP chemo x 6      02/17/2015 Adverse Reaction    She had mild infusion reaction with cycle 1 of treatment.      04/18/2015 Imaging    PET CT scan showed near complete response to treatment.      04/22/2015 Adverse Reaction    Vincristine dose was reduced by 50% due to neuropathy from cycle 4 onwards      06/25/2015 - 07/06/2015 Hospital Admission    She was hospitalized for recent sepsis/bacteremia and a fib with RVR      07/11/2015 Imaging    repeat PEt scan showed complete response to Rx      07/14/2015 -  Chemotherapy    She received maintenance Rituximab every 60 days      12/15/2015 Imaging    PET CT showed no evidence of cancer recurrence      06/15/2016 PET scan    No evidence of active lymphoma on skullbase to thigh FDG PET scan. Small LEFT periaortic retroperitoneal lymph nodes without significant metabolic activity ( [place] 1). No change from prior       INTERVAL HISTORY: Please  see below for problem oriented charting. She returns today to follow-up on recent PET CT scan report. She had recent PET scan and MRI due to concern for cancer recurrence. She had new onset of severe back pain, 5 out of 10, relieved by Aleve and tramadol. No new lymphadenopathy. Denies recent infection. The patient denies any recent signs or symptoms of bleeding such as spontaneous epistaxis, hematuria or hematochezia.  REVIEW OF SYSTEMS:   Constitutional: Denies fevers, chills or abnormal weight loss Eyes: Denies blurriness of vision Ears, nose, mouth, throat, and face: Denies mucositis or sore throat Respiratory: Denies cough, dyspnea or wheezes Cardiovascular: Denies palpitation, chest discomfort or lower extremity swelling Gastrointestinal:  Denies nausea, heartburn or change in bowel habits Skin: Denies abnormal skin rashes Lymphatics: Denies new lymphadenopathy or easy bruising Neurological:Denies numbness, tingling or new weaknesses Behavioral/Psych: Mood is stable, no new changes  All other systems were reviewed with the patient and are negative.  I have reviewed the past medical history, past surgical history, social history and family history with the patient and they are unchanged from previous note.  ALLERGIES:  has No Known Allergies.  MEDICATIONS:  Current Outpatient Prescriptions  Medication Sig Dispense Refill  . acetaminophen (TYLENOL) 325 MG tablet Take 325 mg by  mouth every 6 (six) hours as needed for moderate pain.     Marland Kitchen amoxicillin (AMOXIL) 500 MG tablet Take 2,000 mg by mouth See admin instructions. For dental appointment    . apixaban (ELIQUIS) 2.5 MG TABS tablet Take 2.5 mg by mouth 2 (two) times daily.    . calcium carbonate (TUMS) 500 MG chewable tablet Chew 2 tablets (400 mg of elemental calcium total) by mouth daily. 2 tablet 0  . diltiazem (CARDIZEM CD) 240 MG 24 hr capsule Take 1 capsule (240 mg total) by mouth daily. 30 capsule 5  . flecainide  (TAMBOCOR) 50 MG tablet TAKE 1 TABLET (50 MG TOTAL) BY MOUTH 2 (TWO) TIMES DAILY. 60 tablet 9  . levothyroxine (SYNTHROID, LEVOTHROID) 100 MCG tablet Take 100 mcg by mouth daily.  9  . lidocaine-prilocaine (EMLA) cream Apply 1 application topically as needed. Apply to port a cath site one hour prior to needle stick 30 g 2  . loratadine (CLARITIN) 10 MG tablet Take 10 mg by mouth daily as needed for allergies.     Marland Kitchen ondansetron (ZOFRAN-ODT) 8 MG disintegrating tablet Take 8 mg by mouth every 8 (eight) hours as needed for nausea or vomiting.    . prochlorperazine (COMPAZINE) 10 MG tablet Take 10 mg by mouth every 6 (six) hours as needed for nausea or vomiting.    . RiTUXimab (RITUXAN IV) Inject into the vein every 8 (eight) weeks.     No current facility-administered medications for this visit.    Facility-Administered Medications Ordered in Other Visits  Medication Dose Route Frequency Provider Last Rate Last Dose  . heparin lock flush 100 unit/mL  500 Units Intracatheter Once PRN [name], MD      . ondansetron (ZOFRAN) 8 mg in sodium chloride 0.9 % 50 mL IVPB   Intravenous Once [name], MD      . sodium chloride 0.9 % injection 10 mL  10 mL Intracatheter PRN [name], MD        PHYSICAL EXAMINATION: ECOG PERFORMANCE STATUS: 1 - Symptomatic but completely ambulatory  Vitals:   07/02/16 0939  BP: 133/78  Pulse: 70  Resp: 17  Temp: 97.8 F (36.6 C)   Filed Weights   07/02/16 0939  Weight: 141 lb 11.2 oz (64.3 kg)    GENERAL:alert, no distress and comfortable SKIN: skin color, texture, turgor are normal, no rashes or significant lesions EYES: normal, Conjunctiva are pink and non-injected, sclera clear OROPHARYNX:no exudate, no erythema and lips, buccal mucosa, and tongue normal  NECK: supple, thyroid normal size, non-tender, without nodularity LYMPH:  no palpable lymphadenopathy in the cervical, axillary or inguinal LUNGS: clear to auscultation and percussion with normal  breathing effort HEART: regular rate & rhythm and no murmurs and no lower extremity edema ABDOMEN:abdomen soft, non-tender and normal bowel sounds Musculoskeletal:no cyanosis of digits and no clubbing  NEURO: alert & oriented x 3 with fluent speech, no focal motor/sensory deficits  LABORATORY DATA:  I have reviewed the data as listed    Component Value Date/Time   NA 143 07/02/2016 0910   K 4.1 07/02/2016 0910   CL 105 05/05/2016 0600   CO2 25 07/02/2016 0910   GLUCOSE 87 07/02/2016 0910   BUN 29.9 (H) 07/02/2016 0910   CREATININE 1.3 (H) 07/02/2016 0910   CALCIUM 9.6 07/02/2016 0910   PROT 6.8 07/02/2016 0910   ALBUMIN 3.7 07/02/2016 0910   AST 15 07/02/2016 0910   ALT 14 07/02/2016 0910   ALKPHOS 100 07/02/2016  0910   BILITOT 0.57 07/02/2016 0910   GFRNONAA 46 (L) 05/05/2016 0600   GFRAA 53 (L) 05/05/2016 0600    No results found for: SPEP, UPEP  Lab Results  Component Value Date   WBC 6.3 07/02/2016   NEUTROABS 4.8 07/02/2016   HGB 13.5 07/02/2016   HCT 40.4 07/02/2016   MCV 90.5 07/02/2016   PLT 224 07/02/2016      Chemistry      Component Value Date/Time   NA 143 07/02/2016 0910   K 4.1 07/02/2016 0910   CL 105 05/05/2016 0600   CO2 25 07/02/2016 0910   BUN 29.9 (H) 07/02/2016 0910   CREATININE 1.3 (H) 07/02/2016 0910      Component Value Date/Time   CALCIUM 9.6 07/02/2016 0910   ALKPHOS 100 07/02/2016 0910   AST 15 07/02/2016 0910   ALT 14 07/02/2016 0910   BILITOT 0.57 07/02/2016 0910       RADIOGRAPHIC STUDIES:I reviewed recent PET CT scan with her I have personally reviewed the radiological images as listed and agreed with the findings in the report.    ASSESSMENT & PLAN:  Grade 3a follicular lymphoma of lymph nodes of multiple regions [facility]) Recent PET CT scan in August 2017 showed no evidence of disease. We will continue rituximab every 60 days for 2 years. She is not due for another imaging study until August 2018 We will proceed with  treatment today.  Chronic kidney disease, stage III (moderate) She has chronic kidney disease stage III.  This is likely related to cardiac issues Serum creatinine is stable. Continue medical management  Chronic back pain She had recent MRI of the lumbar spine performed at the [place] which show evidence of severe canal stenosis at L4 and bilateral L3-for severe neural foraminal narrowing with multilevel facet arthosis. The patient would need physical therapy and conservative management rather than surgical management. She is taking tramadol and Aleve as needed for pain and she has prescription of pain medication patch   No orders of the defined types were placed in this encounter.  All questions were answered. The patient knows to call the clinic with any problems, questions or concerns. No barriers to learning was detected. I spent 20 minutes counseling the patient face to face. The total time spent in the appointment was 30 minutes and more than 50% was on counseling and review of test results     [facility], [place], MD 07/02/2016 12:09 PM

## 2016-07-02 NOTE — Patient Instructions (Signed)
Rituximab injection What is this medicine? RITUXIMAB (ri TUX i mab) is a monoclonal antibody. It is used commonly to treat non-Hodgkin lymphoma and other conditions. It is also used to treat rheumatoid arthritis (RA). In RA, this medicine slows the inflammatory process and help reduce joint pain and swelling. This medicine is often used with other cancer or arthritis medications. This medicine may be used for other purposes; ask your health care provider or pharmacist if you have questions. What should I tell my health care provider before I take this medicine? They need to know if you have any of these conditions: -blood disorders -heart disease -history of hepatitis B -infection (especially a virus infection such as chickenpox, cold sores, or herpes) -irregular heartbeat -kidney disease -lung or breathing disease, like asthma -lupus -an unusual or allergic reaction to rituximab, mouse proteins, other medicines, foods, dyes, or preservatives -pregnant or trying to get pregnant -breast-feeding How should I use this medicine? This medicine is for infusion into a vein. It is administered in a hospital or clinic by a specially trained health care professional. A special MedGuide will be given to you by the pharmacist with each prescription and refill. Be sure to read this information carefully each time. Talk to your pediatrician regarding the use of this medicine in children. This medicine is not approved for use in children. Overdosage: If you think you have taken too much of this medicine contact a poison control center or emergency room at once. NOTE: This medicine is only for you. Do not share this medicine with others. What if I miss a dose? It is important not to miss a dose. Call your doctor or health care professional if you are unable to keep an appointment. What may interact with this medicine? -cisplatin -medicines for blood pressure -some other medicines for  arthritis -vaccines This list may not describe all possible interactions. Give your health care provider a list of all the medicines, herbs, non-prescription drugs, or dietary supplements you use. Also tell them if you smoke, drink alcohol, or use illegal drugs. Some items may interact with your medicine. What should I watch for while using this medicine? Report any side effects that you notice during your treatment right away, such as changes in your breathing, fever, chills, dizziness or lightheadedness. These effects are more common with the first dose. Visit your prescriber or health care professional for checks on your progress. You will need to have regular blood work. Report any other side effects. The side effects of this medicine can continue after you finish your treatment. Continue your course of treatment even though you feel ill unless your doctor tells you to stop. Call your doctor or health care professional for advice if you get a fever, chills or sore throat, or other symptoms of a cold or flu. Do not treat yourself. This drug decreases your body's ability to fight infections. Try to avoid being around people who are sick. This medicine may increase your risk to bruise or bleed. Call your doctor or health care professional if you notice any unusual bleeding. Be careful brushing and flossing your teeth or using a toothpick because you may get an infection or bleed more easily. If you have any dental work done, tell your dentist you are receiving this medicine. Avoid taking products that contain aspirin, acetaminophen, ibuprofen, naproxen, or ketoprofen unless instructed by your doctor. These medicines may hide a fever. Do not become pregnant while taking this medicine. Women should inform their doctor if   they wish to become pregnant or think they might be pregnant. There is a potential for serious side effects to an unborn child. Talk to your health care professional or pharmacist for more  information. Do not breast-feed an infant while taking this medicine. What side effects may I notice from receiving this medicine? Side effects that you should report to your doctor or health care professional as soon as possible: -allergic reactions like skin rash, itching or hives, swelling of the face, lips, or tongue -low blood counts - this medicine may decrease the number of white blood cells, red blood cells and platelets. You may be at increased risk for infections and bleeding. -signs of infection - fever or chills, cough, sore throat, pain or difficulty passing urine -signs of decreased platelets or bleeding - bruising, pinpoint red spots on the skin, black, tarry stools, blood in the urine -signs of decreased red blood cells - unusually weak or tired, fainting spells, lightheadedness -breathing problems -confused, not responsive -chest pain -fast, irregular heartbeat -feeling faint or lightheaded, falls -mouth sores -redness, blistering, peeling or loosening of the skin, including inside the mouth -stomach pain -swelling of the ankles, feet, or hands -trouble passing urine or change in the amount of urine Side effects that usually do not require medical attention (report to your doctor or other health care professional if they continue or are bothersome): -anxiety -headache -loss of appetite -muscle aches -nausea -night sweats This list may not describe all possible side effects. Call your doctor for medical advice about side effects. You may report side effects to FDA at 1-800-FDA-1088. Where should I keep my medicine? This drug is given in a hospital or clinic and will not be stored at home. NOTE: This sheet is a summary. It may not cover all possible information. If you have questions about this medicine, talk to your doctor, pharmacist, or health care provider.    2016, Elsevier/Gold Standard. (2014-12-11 22:30:56)  

## 2016-07-14 ENCOUNTER — Telehealth: Payer: Self-pay | Admitting: Hematology and Oncology

## 2016-07-14 NOTE — Telephone Encounter (Signed)
Faxed pt records to [facility] [phone] ° °

## 2016-07-21 ENCOUNTER — Telehealth: Payer: Self-pay | Admitting: *Deleted

## 2016-07-21 NOTE — Telephone Encounter (Signed)
Pt notified to use claritin or zyrtec for symptoms. Use nasal spray for runny nose. Call if develops fever. Verbalized understanding

## 2016-07-21 NOTE — Telephone Encounter (Signed)
Pt called requesting to see Dr Alvy Bimler. Has a runny nose , sneezing, sore throat and coughing. Denies any fever. Is pushing liquids. States PCP is at [place] and very difficult to see. Has only taken Aleve for this problem. RN will review with Dr Alvy Bimler and call back with instructions.

## 2016-08-04 ENCOUNTER — Encounter (HOSPITAL_COMMUNITY): Payer: Self-pay

## 2016-08-04 ENCOUNTER — Emergency Department (HOSPITAL_COMMUNITY)
Admission: EM | Admit: 2016-08-04 | Discharge: 2016-08-04 | Disposition: A | Discharge status: Home / Self Care | Payer: Medicare HMO | Attending: Emergency Medicine | Admitting: Emergency Medicine

## 2016-08-04 ENCOUNTER — Emergency Department (HOSPITAL_COMMUNITY): Payer: Medicare HMO

## 2016-08-04 DIAGNOSIS — R109 Unspecified abdominal pain: Secondary | ICD-10-CM | POA: Insufficient documentation

## 2016-08-04 DIAGNOSIS — N183 Chronic kidney disease, stage 3 (moderate): Secondary | ICD-10-CM | POA: Diagnosis not present

## 2016-08-04 DIAGNOSIS — I129 Hypertensive chronic kidney disease with stage 1 through stage 4 chronic kidney disease, or unspecified chronic kidney disease: Secondary | ICD-10-CM | POA: Insufficient documentation

## 2016-08-04 DIAGNOSIS — Z79899 Other long term (current) drug therapy: Secondary | ICD-10-CM | POA: Insufficient documentation

## 2016-08-04 DIAGNOSIS — E039 Hypothyroidism, unspecified: Secondary | ICD-10-CM | POA: Diagnosis not present

## 2016-08-04 DIAGNOSIS — R1031 Right lower quadrant pain: Secondary | ICD-10-CM | POA: Diagnosis not present

## 2016-08-04 DIAGNOSIS — R300 Dysuria: Secondary | ICD-10-CM

## 2016-08-04 DIAGNOSIS — M25551 Pain in right hip: Secondary | ICD-10-CM | POA: Diagnosis present

## 2016-08-04 LAB — URINALYSIS, ROUTINE W REFLEX MICROSCOPIC
Bilirubin Urine: NEGATIVE
Glucose, UA: NEGATIVE mg/dL
Ketones, ur: NEGATIVE mg/dL
Nitrite: NEGATIVE
Protein, ur: NEGATIVE mg/dL
Specific Gravity, Urine: 1.017 (ref 1.005–1.030)
pH: 6 (ref 5.0–8.0)

## 2016-08-04 LAB — COMPREHENSIVE METABOLIC PANEL
ALT: 16 U/L (ref 14–54)
AST: 20 U/L (ref 15–41)
Albumin: 4.2 g/dL (ref 3.5–5.0)
Alkaline Phosphatase: 84 U/L (ref 38–126)
Anion gap: 7 (ref 5–15)
BUN: 23 mg/dL — ABNORMAL HIGH (ref 6–20)
CO2: 26 mmol/L (ref 22–32)
Calcium: 9.2 mg/dL (ref 8.9–10.3)
Chloride: 105 mmol/L (ref 101–111)
Creatinine, Ser: 1.14 mg/dL — ABNORMAL HIGH (ref 0.44–1.00)
GFR calc Af Amer: 54 mL/min — ABNORMAL LOW (ref >60)
GFR calc non Af Amer: 47 mL/min — ABNORMAL LOW (ref >60)
Glucose, Bld: 141 mg/dL — ABNORMAL HIGH (ref 65–99)
Potassium: 3.9 mmol/L (ref 3.5–5.1)
Sodium: 138 mmol/L (ref 135–145)
Total Bilirubin: 0.3 mg/dL (ref 0.3–1.2)
Total Protein: 6.9 g/dL (ref 6.5–8.1)

## 2016-08-04 LAB — CBC WITH DIFFERENTIAL/PLATELET
Basophils Absolute: 0 10*3/uL (ref 0.0–0.1)
Basophils Relative: 0 %
Eosinophils Absolute: 0.3 10*3/uL (ref 0.0–0.7)
Eosinophils Relative: 4 %
HCT: 40.3 % (ref 36.0–46.0)
Hemoglobin: 13 g/dL (ref 12.0–15.0)
Lymphocytes Relative: 9 %
Lymphs Abs: 0.8 10*3/uL (ref 0.7–4.0)
MCH: 29.7 pg (ref 26.0–34.0)
MCHC: 32.3 g/dL (ref 30.0–36.0)
MCV: 92.2 fL (ref 78.0–100.0)
Monocytes Absolute: 0.9 10*3/uL (ref 0.1–1.0)
Monocytes Relative: 10 %
Neutro Abs: 6.8 10*3/uL (ref 1.7–7.7)
Neutrophils Relative %: 77 %
Platelets: 240 10*3/uL (ref 150–400)
RBC: 4.37 MIL/uL (ref 3.87–5.11)
RDW: 13.9 % (ref 11.5–15.5)
WBC: 8.7 10*3/uL (ref 4.0–10.5)

## 2016-08-04 LAB — URINE MICROSCOPIC-ADD ON

## 2016-08-04 MED ORDER — HEPARIN SOD (PORK) LOCK FLUSH 100 UNIT/ML IV SOLN
500.0000 [IU] | Freq: Once | INTRAVENOUS | Status: AC
  Administered 2016-08-04: 500 [IU]
  Filled 2016-08-04: qty 5

## 2016-08-04 MED ORDER — HYDROCODONE-ACETAMINOPHEN 5-325 MG PO TABS: 1.0000 | ORAL_TABLET | Freq: Four times a day (QID) | ORAL | 0 refills | Status: DC | PRN

## 2016-08-04 MED ORDER — IOPAMIDOL (ISOVUE-300) INJECTION 61%
100.0000 mL | Freq: Once | INTRAVENOUS | Status: AC | PRN
  Administered 2016-08-04: 100 mL via INTRAVENOUS

## 2016-08-04 MED ORDER — CEPHALEXIN 500 MG PO CAPS: 500.0000 mg | ORAL_CAPSULE | Freq: Two times a day (BID) | ORAL | 0 refills | Status: DC

## 2016-08-04 NOTE — Discharge Instructions (Signed)
Your pain may be from a urinary tract infection.  We will treat this with antibiotic.  If your pain does not resolve after this, you may want to see an orthopedic or sports medicine doctor to be certain that it is not a nerve or muscle injury.

## 2016-08-04 NOTE — ED Notes (Signed)
Pt ambulated to bedside commode. Tolerated well.

## 2016-08-04 NOTE — ED Notes (Signed)
Pt [facility]. CT called for transport. NAD noted at this time. Pt ready for CT.

## 2016-08-04 NOTE — ED Notes (Signed)
Pt complains of right hip pain that started yesterday, no injury Patient also states that she's been having urinary frequency and right sided back pain

## 2016-08-04 NOTE — ED Provider Notes (Signed)
[name], PA-C.    Patient has dysuria and left flank/abdominal pain x 1 day.  No fever or vomiting.  Plan at signout:  Follow-up on labs, get CT to rule out diverticulitis/other.  High degree of suspicion for UTI.  Results for orders placed or performed during the hospital encounter of 08/04/16  Urinalysis, Routine w reflex microscopic  Result Value Ref Range   Color, Urine YELLOW YELLOW   APPearance CLOUDY (A) CLEAR   Specific Gravity, Urine 1.017 1.005 - 1.030   pH 6.0 5.0 - 8.0   Glucose, UA NEGATIVE NEGATIVE mg/dL   Hgb urine dipstick MODERATE (A) NEGATIVE   Bilirubin Urine NEGATIVE NEGATIVE   Ketones, ur NEGATIVE NEGATIVE mg/dL   Protein, ur NEGATIVE NEGATIVE mg/dL   Nitrite NEGATIVE NEGATIVE   Leukocytes, UA LARGE (A) NEGATIVE  CBC with Differential  Result Value Ref Range   WBC 8.7 4.0 - 10.5 K/uL   RBC 4.37 3.87 - 5.11 MIL/uL   Hemoglobin 13.0 12.0 - 15.0 g/dL   HCT 40.3 36.0 - 46.0 %   MCV 92.2 78.0 - 100.0 fL   MCH 29.7 26.0 - 34.0 pg   MCHC 32.3 30.0 - 36.0 g/dL   RDW 13.9 11.5 - 15.5 %   Platelets 240 150 - 400 K/uL   Neutrophils Relative % 77 %   Neutro Abs 6.8 1.7 - 7.7 K/uL   Lymphocytes Relative 9 %   Lymphs Abs 0.8 0.7 - 4.0 K/uL   Monocytes Relative 10 %   Monocytes Absolute 0.9 0.1 - 1.0 K/uL   Eosinophils Relative 4 %   Eosinophils Absolute 0.3 0.0 - 0.7 K/uL   Basophils Relative 0 %   Basophils Absolute 0.0 0.0 - 0.1 K/uL  Comprehensive metabolic panel  Result Value Ref Range   Sodium 138 135 - 145 mmol/L   Potassium 3.9 3.5 - 5.1 mmol/L   Chloride 105 101 - 111 mmol/L   CO2 26 22 - 32 mmol/L   Glucose, Bld 141 (H) 65 - 99 mg/dL   BUN 23 (H) 6 - 20 mg/dL   Creatinine, Ser 1.14 (H) 0.44 - 1.00 mg/dL   Calcium 9.2 8.9 - 10.3 mg/dL   Total Protein 6.9 6.5 - 8.1 g/dL   Albumin 4.2 3.5 - 5.0 g/dL   AST 20 15 - 41 U/L   ALT 16 14 - 54 U/L   Alkaline Phosphatase 84 38 - 126 U/L   Total Bilirubin 0.3 0.3 - 1.2 mg/dL   GFR calc non Af Amer 47 (L) >60 mL/min   GFR calc Af Amer 54 (L) >60 mL/min   Anion gap 7 5 - 15  Urine microscopic-add on  Result Value Ref Range   Squamous Epithelial / LPF 0-5 (A) NONE SEEN   WBC, UA TOO NUMEROUS TO COUNT 0 - 5 WBC/hpf   RBC / HPF 0-5 0 - 5 RBC/hpf   Bacteria, UA FEW (A) NONE SEEN   Ct Abdomen Pelvis W Contrast  Result Date: 08/04/2016 CLINICAL DATA:  Pain IS right sided-even though pt's rec states left side. "Patient with history of lymphoma on chemo, a-fib, thyroid disease, CKD, presents with complaint of urinary retention yesterday where she could not could not initiate urinary stream. EXAM: CT ABDOMEN AND PELVIS WITH CONTRAST TECHNIQUE: Multidetector CT imaging of the abdomen and pelvis was performed using the standard protocol following bolus administration of intravenous contrast. CONTRAST:  152mL ISOVUE-300 IOPAMIDOL (ISOVUE-300) INJECTION 61% COMPARISON:  PET-CT  06/15/2016, PET-CT 02/11/2015 FINDINGS: Lower chest: 5 mm LEFT lower lobe pulmonary nodules with central calcification image 1. Stable over multiple exams. Hepatobiliary: No focal hepatic lesion. No biliary duct dilatation. Gallbladder is normal. Common bile duct is normal. Pancreas: Pancreas is normal. No ductal dilatation. No pancreatic inflammation. Spleen: Normal spleen Adrenals/urinary tract: Adrenal glands are normal. Known enhancing renal lesion. Ureters bladder normal. Stomach/Bowel: Stomach, small bowel, appendix, and cecum are normal. The colon and rectosigmoid colon are normal. Vascular/Lymphatic: Small periaortic lymph nodes unchanged in size. Example 8 mm LEFT periaortic lymph node on image 38, series 2. LEFT common iliac node lymph measures 6 mm unchanged. No inguinal adenopathy. Reproductive: Post hysterectomy. Other: No free fluid. Musculoskeletal: Pertinent posterior lumbar fusion and decompression. IMPRESSION: 1. No urinary bladder abnormality identified. No explanation for abdominal pain. 2. Small  periaortic lymph nodes unchanged from comparison PET-CT scan. No evidence lymphoma recurrence. Electronically Signed   By: Suzy Bouchard M.D.   On: 08/04/2016 09:58      [name], PA-C 08/04/16 [place], MD 08/05/16 [phone]

## 2016-08-04 NOTE — ED Triage Notes (Signed)
Patient c/o right hip pain that began yesterday around 430 pm.  States that the pain shoots down from the top of the hip into the upper thigh.  Patient ambulatory to room with steady gait.

## 2016-08-04 NOTE — ED Provider Notes (Signed)
[facility] DEPT Provider Note   CSN: [id number] Arrival date & time: 08/04/16  0447     History   Chief Complaint Chief Complaint  Patient presents with  . Hip Pain    HPI Carrie [name] is a 75 y.o. female.  Patient with history of lymphoma on chemo, a-fib, thyroid disease, CKD, presents with complaint of urinary retention yesterday where she could not start a urinary stream for most of the afternoon. She finally was able to urinate in the evening and felt improved. Since that time she has developed right lower abdominal pain and pain in the right hip. No injury. No fever, nausea or vomiting. She has had urinary urgency with mild incontinence since the episode of retention last night. She has not taken anything at home for pain.    The history is provided by the patient and the spouse. No language interpreter was used.  Hip Pain  Associated symptoms include abdominal pain. Pertinent negatives include no chest pain and no shortness of breath.    Past Medical History:  Diagnosis Date  . A-fib ([place])   . Anemia    "years ago"  . Atrial fibrillation with rapid ventricular response ([place]) 06/25/2015  . Cervical spondylosis without myelopathy 10/25/2013  . Dental bridge present   . DJD (degenerative joint disease)   . Elevated troponin 02/18/2015  . Follicular lymphoma grade 3a ([place]) 02/03/2015  . History of echocardiogram    Echo 12/16: EF 60-65%, no RWMA, severe LAE  . History of nuclear stress test    Myoview 1/17: EF 55%, Normal study. No ischemia or scar.  . Hypothyroid   . Nausea without vomiting 06/20/2015  . Pneumonia   . Stage III chronic kidney disease 07/01/2015  . Thrush of mouth and esophagus ([place]) 06/25/2015    Patient Active Problem List   Diagnosis Date Noted  . Chronic back pain 07/02/2016  . Quality of life palliative care encounter 03/02/2016  . Port catheter in place 03/01/2016  . Chronic atrial fibrillation ([place]) 09/08/2015  . Essential hypertension  07/02/2015  . Diabetes mellitus, likely due to steroids 06/27/2015  . Chronic kidney disease, stage III (moderate) 06/25/2015  . Grade 3a follicular lymphoma of lymph nodes of multiple regions ([place]) 02/03/2015  . Hypothyroidism 09/09/2009    Past Surgical History:  Procedure Laterality Date  . ABDOMINAL HYSTERECTOMY    . APPENDECTOMY    . BACK SURGERY     X5-lumbar-fusion  . COLONOSCOPY    . DILATION AND CURETTAGE OF UTERUS    . LYMPH NODE BIOPSY Right 01/21/2015   Procedure: RIGHT GROIN LYMPH NODE BIOPSY;  Surgeon: Erroll Luna, MD;  Location: [place];  Service: General;  Laterality: Right;  . Ovarian cyst resection    . patelar tendon transplants     Left/right  . PORTACATH PLACEMENT Right 02/13/2015   Procedure: INSERTION PORT-A-CATH WITH ULTRASOUND;  Surgeon: Erroll Luna, MD;  Location: [place] [facility];  Service: General;  Laterality: Right;  . TOTAL KNEE ARTHROPLASTY  2011   Right    OB History    No data available       Home Medications    Prior to Admission medications   Medication Sig Start Date End Date Taking? Authorizing Provider  acetaminophen (TYLENOL) 325 MG tablet Take 325 mg by mouth every 6 (six) hours as needed for moderate pain.     Historical Provider, MD  amoxicillin (AMOXIL) 500 MG tablet Take 2,000 mg by mouth See admin instructions. For  dental appointment    Historical Provider, MD  apixaban (ELIQUIS) 2.5 MG TABS tablet Take 2.5 mg by mouth 2 (two) times daily.    Historical Provider, MD  calcium carbonate (TUMS) 500 MG chewable tablet Chew 2 tablets (400 mg of elemental calcium total) by mouth daily. 06/21/15   Volanda Napoleon, MD  diltiazem (CARDIZEM CD) 240 MG 24 hr capsule Take 1 capsule (240 mg total) by mouth daily. 08/05/15   Liliane Shi, PA-C  flecainide (TAMBOCOR) 50 MG tablet TAKE 1 TABLET (50 MG TOTAL) BY MOUTH 2 (TWO) TIMES DAILY. 12/12/15   Sherren Mocha, MD  levothyroxine (SYNTHROID, LEVOTHROID) 100 MCG tablet Take 100 mcg by  mouth daily. 04/22/15   Historical Provider, MD  lidocaine-prilocaine (EMLA) cream Apply 1 application topically as needed. Apply to port a cath site one hour prior to needle stick 02/07/15   [name], MD  loratadine (CLARITIN) 10 MG tablet Take 10 mg by mouth daily as needed for allergies.     Historical Provider, MD  ondansetron (ZOFRAN-ODT) 8 MG disintegrating tablet Take 8 mg by mouth every 8 (eight) hours as needed for nausea or vomiting.    Historical Provider, MD  prochlorperazine (COMPAZINE) 10 MG tablet Take 10 mg by mouth every 6 (six) hours as needed for nausea or vomiting.    Historical Provider, MD  RiTUXimab (RITUXAN IV) Inject into the vein every 8 (eight) weeks.    Historical Provider, MD    Family History Family History  Problem Relation Age of Onset  . Cancer Mother     Breast, lung NHL  . Cancer Sister     Multiple myeloma    Social History Social History  Substance Use Topics  . Smoking status: Never Smoker  . Smokeless tobacco: Never Used  . Alcohol use Yes     Comment: occassional wine     Allergies   Review of patient's allergies indicates no known allergies.   Review of Systems Review of Systems  Constitutional: Negative for chills and fever.  Respiratory: Negative.  Negative for shortness of breath.   Cardiovascular: Negative.  Negative for chest pain.  Gastrointestinal: Positive for abdominal pain. Negative for nausea.  Genitourinary: Positive for difficulty urinating and urgency.  Musculoskeletal:       Right hip pain  Skin: Negative.   Neurological: Negative.  Negative for weakness and light-headedness.     Physical Exam Updated Vital Signs BP 129/86 (BP Location: Left Arm)   Pulse 70   Temp 97.7 F (36.5 C) (Oral)   Resp 18   SpO2 97%   Physical Exam  Constitutional: She is oriented to person, place, and time. She appears well-developed and well-nourished.  HENT:  Head: Normocephalic.  Neck: Normal range of motion. Neck supple.    Cardiovascular: Normal rate.   Pulmonary/Chest: Effort normal.  Abdominal: Soft. There is tenderness (Right lower abdominal tenderness without guarding. ). There is no rebound and no guarding.  Musculoskeletal: Normal range of motion.  FROM hips bilaterally. Tender to area of iliac crest. Nontender hip.  Neurological: She is alert and oriented to person, place, and time.  Skin: Skin is warm and dry. No rash noted.  Psychiatric: She has a normal mood and affect.  Nursing note and vitals reviewed.    ED Treatments / Results  Labs (all labs ordered are listed, but only abnormal results are displayed) Labs Reviewed  URINE CULTURE  URINALYSIS, ROUTINE W REFLEX MICROSCOPIC (NOT AT [facility])  CBC WITH DIFFERENTIAL/PLATELET  COMPREHENSIVE METABOLIC PANEL    EKG  EKG Interpretation None       Radiology No results found.  Procedures Procedures (including critical care time)  Medications Ordered in ED Medications - No data to display   Initial Impression / Assessment and Plan / ED Course  I have reviewed the triage vital signs and the nursing notes.  Pertinent labs & imaging results that were available during my care of the patient were reviewed by me and considered in my medical decision making (see chart for details).  Clinical Course    Patient arrives with complaint of hip pain since last night, resolved episode urinary retention now with urgency. Labs pending. The patient has been ambulatory without difficulty. Suspect UTI as potential cause of symptoms with abdominal tenderness and urinary symptoms.   Patient care signed out to Lorre Munroe, PA-C, with labs pending for reassessment and disposition.   Final Clinical Impressions(s) / ED Diagnoses   Final diagnoses:  None    New Prescriptions New Prescriptions   No medications on file     Charlann Lange, PA-C 08/04/16 1443    Orpah Greek, MD 08/05/16 [phone]

## 2016-08-05 ENCOUNTER — Telehealth (HOSPITAL_BASED_OUTPATIENT_CLINIC_OR_DEPARTMENT_OTHER): Payer: Self-pay | Admitting: Emergency Medicine

## 2016-08-05 LAB — URINE CULTURE

## 2016-08-31 ENCOUNTER — Ambulatory Visit (HOSPITAL_BASED_OUTPATIENT_CLINIC_OR_DEPARTMENT_OTHER): Payer: Medicare HMO | Admitting: Hematology and Oncology

## 2016-08-31 ENCOUNTER — Telehealth: Payer: Self-pay | Admitting: Hematology and Oncology

## 2016-08-31 ENCOUNTER — Encounter: Payer: Self-pay | Admitting: *Deleted

## 2016-08-31 ENCOUNTER — Telehealth: Payer: Self-pay | Admitting: *Deleted

## 2016-08-31 ENCOUNTER — Other Ambulatory Visit (HOSPITAL_BASED_OUTPATIENT_CLINIC_OR_DEPARTMENT_OTHER): Payer: Medicare HMO

## 2016-08-31 ENCOUNTER — Other Ambulatory Visit: Payer: Self-pay | Admitting: Hematology and Oncology

## 2016-08-31 ENCOUNTER — Ambulatory Visit (HOSPITAL_BASED_OUTPATIENT_CLINIC_OR_DEPARTMENT_OTHER): Payer: Medicare HMO

## 2016-08-31 ENCOUNTER — Ambulatory Visit: Payer: Medicare HMO

## 2016-08-31 VITALS — BP 142/67 | HR 58 | Temp 97.7°F | Resp 18 | Wt 140.0 lb

## 2016-08-31 VITALS — BP 135/69 | HR 70 | Temp 97.2°F | Resp 16

## 2016-08-31 DIAGNOSIS — Z79899 Other long term (current) drug therapy: Secondary | ICD-10-CM

## 2016-08-31 DIAGNOSIS — C8238 Follicular lymphoma grade IIIa, lymph nodes of multiple sites: Secondary | ICD-10-CM

## 2016-08-31 DIAGNOSIS — M5441 Lumbago with sciatica, right side: Secondary | ICD-10-CM | POA: Insufficient documentation

## 2016-08-31 DIAGNOSIS — R300 Dysuria: Secondary | ICD-10-CM

## 2016-08-31 DIAGNOSIS — I482 Chronic atrial fibrillation, unspecified: Secondary | ICD-10-CM

## 2016-08-31 DIAGNOSIS — Z5112 Encounter for antineoplastic immunotherapy: Secondary | ICD-10-CM | POA: Diagnosis not present

## 2016-08-31 DIAGNOSIS — G8929 Other chronic pain: Secondary | ICD-10-CM

## 2016-08-31 DIAGNOSIS — Z95828 Presence of other vascular implants and grafts: Secondary | ICD-10-CM

## 2016-08-31 LAB — CBC WITH DIFFERENTIAL/PLATELET
BASO%: 0.5 % (ref 0.0–2.0)
Basophils Absolute: 0 10*3/uL (ref 0.0–0.1)
EOS%: 3.4 % (ref 0.0–7.0)
Eosinophils Absolute: 0.2 10*3/uL (ref 0.0–0.5)
HCT: 42.3 % (ref 34.8–46.6)
HGB: 13.8 g/dL (ref 11.6–15.9)
LYMPH%: 8.3 % — ABNORMAL LOW (ref 14.0–49.7)
MCH: 29.7 pg (ref 25.1–34.0)
MCHC: 32.7 g/dL (ref 31.5–36.0)
MCV: 90.8 fL (ref 79.5–101.0)
MONO#: 0.6 10*3/uL (ref 0.1–0.9)
MONO%: 8.3 % (ref 0.0–14.0)
NEUT#: 5.7 10*3/uL (ref 1.5–6.5)
NEUT%: 79.5 % — ABNORMAL HIGH (ref 38.4–76.8)
Platelets: 247 10*3/uL (ref 145–400)
RBC: 4.66 10*6/uL (ref 3.70–5.45)
RDW: 14 % (ref 11.2–14.5)
WBC: 7.1 10*3/uL (ref 3.9–10.3)
lymph#: 0.6 10*3/uL — ABNORMAL LOW (ref 0.9–3.3)

## 2016-08-31 LAB — COMPREHENSIVE METABOLIC PANEL
ALT: 13 U/L (ref 0–55)
AST: 14 U/L (ref 5–34)
Albumin: 3.7 g/dL (ref 3.5–5.0)
Alkaline Phosphatase: 100 U/L (ref 40–150)
Anion Gap: 10 mEq/L (ref 3–11)
BUN: 28.8 mg/dL — ABNORMAL HIGH (ref 7.0–26.0)
CO2: 25 mEq/L (ref 22–29)
Calcium: 9.2 mg/dL (ref 8.4–10.4)
Chloride: 105 mEq/L (ref 98–109)
Creatinine: 1.2 mg/dL — ABNORMAL HIGH (ref 0.6–1.1)
EGFR: 45 mL/min/{1.73_m2} — ABNORMAL LOW (ref >90)
Glucose: 148 mg/dl — ABNORMAL HIGH (ref 70–140)
Potassium: 4.2 mEq/L (ref 3.5–5.1)
Sodium: 140 mEq/L (ref 136–145)
Total Bilirubin: 0.49 mg/dL (ref 0.20–1.20)
Total Protein: 6.6 g/dL (ref 6.4–8.3)

## 2016-08-31 LAB — URINALYSIS, MICROSCOPIC - CHCC
Bilirubin (Urine): NEGATIVE
Blood: NEGATIVE
Glucose: NEGATIVE mg/dL
Ketones: NEGATIVE mg/dL
Leukocyte Esterase: NEGATIVE
Nitrite: NEGATIVE
Protein: NEGATIVE mg/dL
RBC / HPF: NEGATIVE (ref 0–2)
Specific Gravity, Urine: 1.015 (ref 1.003–1.035)
Urobilinogen, UR: 0.2 mg/dL (ref 0.2–1)
pH: 6.5 (ref 4.6–8.0)

## 2016-08-31 LAB — LACTATE DEHYDROGENASE: LDH: 170 U/L (ref 125–245)

## 2016-08-31 MED ORDER — MORPHINE SULFATE 15 MG PO TABS: 15.0000 mg | ORAL_TABLET | ORAL | 0 refills | Status: DC | PRN

## 2016-08-31 MED ORDER — ACETAMINOPHEN 325 MG PO TABS
ORAL_TABLET | ORAL | Status: AC
  Filled 2016-08-31: qty 2

## 2016-08-31 MED ORDER — DIPHENHYDRAMINE HCL 25 MG PO CAPS
ORAL_CAPSULE | ORAL | Status: AC
  Filled 2016-08-31: qty 2

## 2016-08-31 MED ORDER — SODIUM CHLORIDE 0.9 % IV SOLN
Freq: Once | INTRAVENOUS | Status: AC
  Administered 2016-08-31: 10:00:00 via INTRAVENOUS

## 2016-08-31 MED ORDER — SODIUM CHLORIDE 0.9 % IJ SOLN
10.0000 mL | INTRAMUSCULAR | Status: DC | PRN
  Administered 2016-08-31: 10 mL via INTRAVENOUS
  Filled 2016-08-31: qty 10

## 2016-08-31 MED ORDER — SODIUM CHLORIDE 0.9 % IJ SOLN
10.0000 mL | INTRAMUSCULAR | Status: DC | PRN
  Administered 2016-08-31: 10 mL
  Filled 2016-08-31: qty 10

## 2016-08-31 MED ORDER — DIPHENHYDRAMINE HCL 25 MG PO CAPS
50.0000 mg | ORAL_CAPSULE | Freq: Once | ORAL | Status: AC
  Administered 2016-08-31: 50 mg via ORAL

## 2016-08-31 MED ORDER — SODIUM CHLORIDE 0.9 % IV SOLN
375.0000 mg/m2 | Freq: Once | INTRAVENOUS | Status: AC
  Administered 2016-08-31: 600 mg via INTRAVENOUS
  Filled 2016-08-31: qty 50

## 2016-08-31 MED ORDER — ACETAMINOPHEN 325 MG PO TABS
650.0000 mg | ORAL_TABLET | Freq: Once | ORAL | Status: AC
  Administered 2016-08-31: 650 mg via ORAL

## 2016-08-31 MED ORDER — HEPARIN SOD (PORK) LOCK FLUSH 100 UNIT/ML IV SOLN
500.0000 [IU] | Freq: Once | INTRAVENOUS | Status: AC | PRN
  Administered 2016-08-31: 500 [IU]
  Filled 2016-08-31: qty 5

## 2016-08-31 NOTE — Telephone Encounter (Signed)
LM with results below. To call if has questions

## 2016-08-31 NOTE — Telephone Encounter (Signed)
Appointments scheduled per 08/31/16 los.  Copy of  AVS report and appointment schedule was given to patient, per 08/31/16 los. ° °

## 2016-08-31 NOTE — Patient Instructions (Signed)
[facility] Discharge Instructions for Patients Receiving Chemotherapy  Today you received the following chemotherapy agents Rituxan  To help prevent nausea and vomiting after your treatment, we encourage you to take your nausea medication    If you develop nausea and vomiting that is not controlled by your nausea medication, call the clinic.   BELOW ARE SYMPTOMS THAT SHOULD BE REPORTED IMMEDIATELY:  *FEVER GREATER THAN 100.5 F  *CHILLS WITH OR WITHOUT FEVER  NAUSEA AND VOMITING THAT IS NOT CONTROLLED WITH YOUR NAUSEA MEDICATION  *UNUSUAL SHORTNESS OF BREATH  *UNUSUAL BRUISING OR BLEEDING  TENDERNESS IN MOUTH AND THROAT WITH OR WITHOUT PRESENCE OF ULCERS  *URINARY PROBLEMS  *BOWEL PROBLEMS  UNUSUAL RASH Items with * indicate a potential emergency and should be followed up as soon as possible.  Feel free to call the clinic you have any questions or concerns. The clinic phone number is [phone].  Please show the CHEMO ALERT CARD at check-in to the Emergency Department and triage nurse.   

## 2016-08-31 NOTE — Progress Notes (Signed)
08/31/2016 0915  18 month PREVENT study visit The patient is here today for MD visit and infusion.  This RN met with the patient for 18 month study visit.  The patient is not on study drug. The patient stopped study drug on 03/02/15 due to chest and abdominal pain and indigestion. She did not resume taking the study drug.  The patient completed the 18 month questionnaires.  The patient was interviewed and the Telephone Contact Form was completed.  The patient's current medications were reviewed and the current medication form was completed.  The patient understands she is due in April 2019 for cardiac MRI and 24 month nurse visit.  She verbalized understanding and was without any questions.  This RN thanked the patient for her continued commitment to the study.   [name], RN, BSN, MHA, OCN

## 2016-09-01 ENCOUNTER — Encounter: Payer: Self-pay | Admitting: Hematology and Oncology

## 2016-09-01 NOTE — Assessment & Plan Note (Signed)
She had recent dysuria and was placed on Keflex She is to have suprapubic discomfort. I will proceed to order urinalysis and urine culture to make sure that her urinary tract infection has resolved.

## 2016-09-01 NOTE — Assessment & Plan Note (Signed)
Recent PET CT scan in August 2017 showed no evidence of disease. We will continue rituximab every 60 days for 2 years. She is not due for another imaging study until August 2018 We will proceed with treatment today. 

## 2016-09-01 NOTE — Assessment & Plan Note (Signed)
She is currently rate controlled. She will continue medical management She is on chronic anticoagulation therapy.

## 2016-09-01 NOTE — Progress Notes (Signed)
[facility] OFFICE PROGRESS NOTE  Patient Care Team: Arvella Nigh, MD as PCP - General (Obstetrics and Gynecology) Carola Frost, RN as Registered Nurse (Medical Oncology) [name], MD as Attending Physician (Internal Medicine)  SUMMARY OF ONCOLOGIC HISTORY:   Grade 3a follicular lymphoma of lymph nodes of multiple regions [facility])   01/21/2015 Surgery    She underwent excisional lymph node biopsy that came back follicular lymphoma grade 3      01/21/2015 Pathology Results    Accession: [id number] biopsy confirmed follicular lymphoma      02/07/2015 Imaging    Echocardiogram showed ejection fraction of 55-60%      02/11/2015 Imaging     PET CT scan show possible splenic involvement and diffuse lymphadenopathy throughout      02/11/2015 Bone Marrow Biopsy     bone marrow biopsy was performed and is involved by lymphoma with translocation of igH/BCL2      02/13/2015 Procedure    She had port placement.      02/17/2015 - 06/02/2015 Chemotherapy    She received R-CHOP chemo x 6      02/17/2015 Adverse Reaction    She had mild infusion reaction with cycle 1 of treatment.      04/18/2015 Imaging    PET CT scan showed near complete response to treatment.      04/22/2015 Adverse Reaction    Vincristine dose was reduced by 50% due to neuropathy from cycle 4 onwards      06/25/2015 - 07/06/2015 Hospital Admission    She was hospitalized for recent sepsis/bacteremia and a fib with RVR      07/11/2015 Imaging    repeat PEt scan showed complete response to Rx      07/14/2015 -  Chemotherapy    She received maintenance Rituximab every 60 days      12/15/2015 Imaging    PET CT showed no evidence of cancer recurrence      06/15/2016 PET scan    No evidence of active lymphoma on skullbase to thigh FDG PET scan. Small LEFT periaortic retroperitoneal lymph nodes without significant metabolic activity ( [place] 1). No change from prior       INTERVAL HISTORY: Please  see below for problem oriented charting. She returns for rituximab treatment. She was in the emergency department recently due to suprapubic discomfort. She was placed on oral antibiotic therapy. She also has severe back pain radiating to the right groin causing right lower leg discomfort. She denies neurological deficit such as changes in sensation or weakness. No regional lymphadenopathy. She denies recent cough. No changes in bowel habits  REVIEW OF SYSTEMS:   Constitutional: Denies fevers, chills or abnormal weight loss Eyes: Denies blurriness of vision Ears, nose, mouth, throat, and face: Denies mucositis or sore throat Respiratory: Denies cough, dyspnea or wheezes Cardiovascular: Denies palpitation, chest discomfort or lower extremity swelling Gastrointestinal:  Denies nausea, heartburn or change in bowel habits Skin: Denies abnormal skin rashes Lymphatics: Denies new lymphadenopathy or easy bruising Neurological:Denies numbness, tingling or new weaknesses Behavioral/Psych: Mood is stable, no new changes  All other systems were reviewed with the patient and are negative.  I have reviewed the past medical history, past surgical history, social history and family history with the patient and they are unchanged from previous note.  ALLERGIES:  has No Known Allergies.  MEDICATIONS:  Current Outpatient Prescriptions  Medication Sig Dispense Refill  . acetaminophen (TYLENOL) 325 MG tablet Take 325 mg by mouth every  6 (six) hours as needed for moderate pain.     Marland Kitchen amoxicillin (AMOXIL) 500 MG tablet Take 2,000 mg by mouth See admin instructions. For dental appointment    . apixaban (ELIQUIS) 2.5 MG TABS tablet Take 2.5 mg by mouth 2 (two) times daily.    . calcium carbonate (TUMS) 500 MG chewable tablet Chew 2 tablets (400 mg of elemental calcium total) by mouth daily. 2 tablet 0  . diclofenac (FLECTOR) 1.3 % PTCH Place 1 patch onto the skin 2 (two) times daily.    Marland Kitchen diltiazem  (CARDIZEM CD) 240 MG 24 hr capsule Take 1 capsule (240 mg total) by mouth daily. 30 capsule 5  . HYDROcodone-acetaminophen (NORCO/VICODIN) 5-325 MG tablet Take 1 tablet by mouth every 6 (six) hours as needed. 10 tablet 0  . levothyroxine (SYNTHROID, LEVOTHROID) 100 MCG tablet Take 100 mcg by mouth daily.  9  . lidocaine-prilocaine (EMLA) cream Apply 1 application topically as needed. Apply to port a cath site one hour prior to needle stick 30 g 2  . loratadine (CLARITIN) 10 MG tablet Take 10 mg by mouth daily as needed for allergies.     . RiTUXimab (RITUXAN IV) Inject into the vein every 8 (eight) weeks.    . traMADol (ULTRAM) 50 MG tablet Take 50 mg by mouth every 6 (six) hours as needed for moderate pain.    Marland Kitchen morphine (MSIR) 15 MG tablet Take 1 tablet (15 mg total) by mouth every 4 (four) hours as needed for severe pain. 30 tablet 0  . ondansetron (ZOFRAN-ODT) 8 MG disintegrating tablet Take 8 mg by mouth every 8 (eight) hours as needed for nausea or vomiting.    . prochlorperazine (COMPAZINE) 10 MG tablet Take 10 mg by mouth every 6 (six) hours as needed for nausea or vomiting.     No current facility-administered medications for this visit.    Facility-Administered Medications Ordered in Other Visits  Medication Dose Route Frequency Provider Last Rate Last Dose  . ondansetron (ZOFRAN) 8 mg in sodium chloride 0.9 % 50 mL IVPB   Intravenous Once [name], MD        PHYSICAL EXAMINATION: ECOG PERFORMANCE STATUS: 1 - Symptomatic but completely ambulatory  Vitals:   08/31/16 0919  BP: (!) 142/67  Pulse: (!) 58  Resp: 18  Temp: 97.7 F (36.5 C)   Filed Weights   08/31/16 0919  Weight: 140 lb (63.5 kg)    GENERAL:alert, no distress and comfortable SKIN: skin color, texture, turgor are normal, no rashes or significant lesions EYES: normal, Conjunctiva are pink and non-injected, sclera clear OROPHARYNX:no exudate, no erythema and lips, buccal mucosa, and tongue normal  NECK:  supple, thyroid normal size, non-tender, without nodularity LYMPH:  no palpable lymphadenopathy in the cervical, axillary or inguinal LUNGS: clear to auscultation and percussion with normal breathing effort HEART: regular rate & rhythm and no murmurs and no lower extremity edema ABDOMEN:abdomen soft, non-tender and normal bowel sounds. She has mild suprapubic discomfort Musculoskeletal:no cyanosis of digits and no clubbing. She has lower back pain on exam with spinal tenderness NEURO: alert & oriented x 3 with fluent speech, no focal motor/sensory deficits  LABORATORY DATA:  I have reviewed the data as listed    Component Value Date/Time   NA 140 08/31/2016 0832   K 4.2 08/31/2016 0832   CL 105 08/04/2016 0605   CO2 25 08/31/2016 0832   GLUCOSE 148 (H) 08/31/2016 0832   BUN 28.8 (H) 08/31/2016 7092  CREATININE 1.2 (H) 08/31/2016 0832   CALCIUM 9.2 08/31/2016 0832   PROT 6.6 08/31/2016 0832   ALBUMIN 3.7 08/31/2016 0832   AST 14 08/31/2016 0832   ALT 13 08/31/2016 0832   ALKPHOS 100 08/31/2016 0832   BILITOT 0.49 08/31/2016 0832   GFRNONAA 47 (L) 08/04/2016 0605   GFRAA 54 (L) 08/04/2016 0605    No results found for: SPEP, UPEP  Lab Results  Component Value Date   WBC 7.1 08/31/2016   NEUTROABS 5.7 08/31/2016   HGB 13.8 08/31/2016   HCT 42.3 08/31/2016   MCV 90.8 08/31/2016   PLT 247 08/31/2016      Chemistry      Component Value Date/Time   NA 140 08/31/2016 0832   K 4.2 08/31/2016 0832   CL 105 08/04/2016 0605   CO2 25 08/31/2016 0832   BUN 28.8 (H) 08/31/2016 0832   CREATININE 1.2 (H) 08/31/2016 0832      Component Value Date/Time   CALCIUM 9.2 08/31/2016 0832   ALKPHOS 100 08/31/2016 0832   AST 14 08/31/2016 0832   ALT 13 08/31/2016 0832   BILITOT 0.49 08/31/2016 0832      ASSESSMENT & PLAN:  Grade 3a follicular lymphoma of lymph nodes of multiple regions [facility]) Recent PET CT scan in August 2017 showed no evidence of disease. We will continue  rituximab every 60 days for 2 years. She is not due for another imaging study until August 2018 We will proceed with treatment today.  Chronic back pain She has severe back pain recently, unresolved with recent prednisone therapy with radiating pain down her legs I will proceed to order MRI with contrast. She has appointment pending to see a orthopedic surgeon  Chronic atrial fibrillation ([place]) She is currently rate controlled. She will continue medical management She is on chronic anticoagulation therapy.    Dysuria She had recent dysuria and was placed on Keflex She is to have suprapubic discomfort. I will proceed to order urinalysis and urine culture to make sure that her urinary tract infection has resolved.   Orders Placed This Encounter  Procedures  . Urine culture    Standing Status:   Future    Number of Occurrences:   1    Standing Expiration Date:   10/05/2017  . MR Lumbar Spine W Wo Contrast    Standing Status:   Future    Standing Expiration Date:   10/31/2017    Order Specific Question:   If indicated for the ordered procedure, I authorize the administration of contrast media per Radiology protocol    Answer:   Yes    Order Specific Question:   Reason for Exam (SYMPTOM  OR DIAGNOSIS REQUIRED)    Answer:   severe back pain, sciatica, hx lymphoma    Order Specific Question:   Preferred imaging location?    Answer:   [facility] (table limit-350 lbs)    Order Specific Question:   What is the patient's sedation requirement?    Answer:   No Sedation    Order Specific Question:   Does the patient have a pacemaker or implanted devices?    Answer:   No  . Urinalysis, Microscopic - [facility]    Standing Status:   Future    Number of Occurrences:   1    Standing Expiration Date:   10/05/2017   All questions were answered. The patient knows to call the clinic with any problems, questions or concerns. No barriers to learning  was detected. I spent 25 minutes  counseling the patient face to face. The total time spent in the appointment was 40 minutes and more than 50% was on counseling and review of test results     [name], MD 09/01/2016 10:35 AM

## 2016-09-01 NOTE — Assessment & Plan Note (Signed)
She has severe back pain recently, unresolved with recent prednisone therapy with radiating pain down her legs I will proceed to order MRI with contrast. She has appointment pending to see a orthopedic surgeon

## 2016-09-02 ENCOUNTER — Telehealth: Payer: Self-pay | Admitting: *Deleted

## 2016-09-02 LAB — URINE CULTURE

## 2016-09-02 NOTE — Telephone Encounter (Signed)
Notified pt of Urine culture negative.  She verbalized understanding and also reports she has not heard anything about MRI Dr. Alvy Bimler ordered to be done by 11/21.   It has not been Pre Certed yet so I sent message to [facility]aged [employer] dept to please check on this so we can get it scheduled soon.

## 2016-09-02 NOTE — Telephone Encounter (Signed)
-----   Message from [name], MD sent at 09/02/2016  9:57 AM EST ----- Regarding: urine culture OK Pls let her know ----- Message ----- From: Interface, Lab In Three Zero One Sent: 08/31/2016   9:27 AM To: [name], MD

## 2016-09-04 ENCOUNTER — Other Ambulatory Visit: Payer: Self-pay

## 2016-09-04 ENCOUNTER — Emergency Department (HOSPITAL_COMMUNITY)
Admission: EM | Admit: 2016-09-04 | Discharge: 2016-09-04 | Disposition: A | Discharge status: Home / Self Care | Payer: Medicare HMO | Attending: Emergency Medicine | Admitting: Emergency Medicine

## 2016-09-04 ENCOUNTER — Encounter (HOSPITAL_COMMUNITY): Payer: Self-pay | Admitting: Emergency Medicine

## 2016-09-04 ENCOUNTER — Emergency Department (HOSPITAL_COMMUNITY): Payer: Medicare HMO

## 2016-09-04 DIAGNOSIS — M25551 Pain in right hip: Secondary | ICD-10-CM | POA: Diagnosis not present

## 2016-09-04 DIAGNOSIS — M25559 Pain in unspecified hip: Secondary | ICD-10-CM

## 2016-09-04 DIAGNOSIS — E039 Hypothyroidism, unspecified: Secondary | ICD-10-CM | POA: Diagnosis not present

## 2016-09-04 DIAGNOSIS — S76311A Strain of muscle, fascia and tendon of the posterior muscle group at thigh level, right thigh, initial encounter: Secondary | ICD-10-CM | POA: Diagnosis not present

## 2016-09-04 DIAGNOSIS — E1122 Type 2 diabetes mellitus with diabetic chronic kidney disease: Secondary | ICD-10-CM | POA: Diagnosis not present

## 2016-09-04 DIAGNOSIS — M87851 Other osteonecrosis, right femur: Secondary | ICD-10-CM | POA: Diagnosis not present

## 2016-09-04 DIAGNOSIS — Z96651 Presence of right artificial knee joint: Secondary | ICD-10-CM | POA: Diagnosis not present

## 2016-09-04 DIAGNOSIS — N183 Chronic kidney disease, stage 3 (moderate): Secondary | ICD-10-CM | POA: Diagnosis not present

## 2016-09-04 DIAGNOSIS — Z7901 Long term (current) use of anticoagulants: Secondary | ICD-10-CM | POA: Insufficient documentation

## 2016-09-04 DIAGNOSIS — I129 Hypertensive chronic kidney disease with stage 1 through stage 4 chronic kidney disease, or unspecified chronic kidney disease: Secondary | ICD-10-CM | POA: Insufficient documentation

## 2016-09-04 DIAGNOSIS — Z79899 Other long term (current) drug therapy: Secondary | ICD-10-CM | POA: Diagnosis not present

## 2016-09-04 DIAGNOSIS — M87051 Idiopathic aseptic necrosis of right femur: Secondary | ICD-10-CM | POA: Insufficient documentation

## 2016-09-04 DIAGNOSIS — S76312A Strain of muscle, fascia and tendon of the posterior muscle group at thigh level, left thigh, initial encounter: Secondary | ICD-10-CM | POA: Diagnosis not present

## 2016-09-04 LAB — CBC WITH DIFFERENTIAL/PLATELET
Basophils Absolute: 0 10*3/uL (ref 0.0–0.1)
Basophils Relative: 0 %
Eosinophils Absolute: 0.1 10*3/uL (ref 0.0–0.7)
Eosinophils Relative: 1 %
HCT: 39.7 % (ref 36.0–46.0)
Hemoglobin: 12.9 g/dL (ref 12.0–15.0)
Lymphocytes Relative: 13 %
Lymphs Abs: 1 10*3/uL (ref 0.7–4.0)
MCH: 29.7 pg (ref 26.0–34.0)
MCHC: 32.5 g/dL (ref 30.0–36.0)
MCV: 91.5 fL (ref 78.0–100.0)
Monocytes Absolute: 0.7 10*3/uL (ref 0.1–1.0)
Monocytes Relative: 9 %
Neutro Abs: 5.9 10*3/uL (ref 1.7–7.7)
Neutrophils Relative %: 77 %
Platelets: 219 10*3/uL (ref 150–400)
RBC: 4.34 MIL/uL (ref 3.87–5.11)
RDW: 13.6 % (ref 11.5–15.5)
WBC: 7.7 10*3/uL (ref 4.0–10.5)

## 2016-09-04 LAB — COMPREHENSIVE METABOLIC PANEL
ALT: 29 U/L (ref 14–54)
AST: 28 U/L (ref 15–41)
Albumin: 4.3 g/dL (ref 3.5–5.0)
Alkaline Phosphatase: 75 U/L (ref 38–126)
Anion gap: 9 (ref 5–15)
BUN: 21 mg/dL — ABNORMAL HIGH (ref 6–20)
CO2: 23 mmol/L (ref 22–32)
Calcium: 9.1 mg/dL (ref 8.9–10.3)
Chloride: 106 mmol/L (ref 101–111)
Creatinine, Ser: 1.04 mg/dL — ABNORMAL HIGH (ref 0.44–1.00)
GFR calc Af Amer: >60 mL/min (ref >60)
GFR calc non Af Amer: 52 mL/min — ABNORMAL LOW (ref >60)
Glucose, Bld: 96 mg/dL (ref 65–99)
Potassium: 3.7 mmol/L (ref 3.5–5.1)
Sodium: 138 mmol/L (ref 135–145)
Total Bilirubin: 1 mg/dL (ref 0.3–1.2)
Total Protein: 6.4 g/dL — ABNORMAL LOW (ref 6.5–8.1)

## 2016-09-04 MED ORDER — LORAZEPAM 2 MG/ML IJ SOLN
1.0000 mg | Freq: Once | INTRAMUSCULAR | Status: AC
  Administered 2016-09-04: 1 mg via INTRAVENOUS
  Filled 2016-09-04: qty 1

## 2016-09-04 MED ORDER — HEPARIN SOD (PORK) LOCK FLUSH 100 UNIT/ML IV SOLN
500.0000 [IU] | Freq: Once | INTRAVENOUS | Status: AC
  Administered 2016-09-04: 500 [IU]
  Filled 2016-09-04: qty 5

## 2016-09-04 MED ORDER — GADOBENATE DIMEGLUMINE 529 MG/ML IV SOLN
12.0000 mL | Freq: Once | INTRAVENOUS | Status: AC | PRN
  Administered 2016-09-04: 12 mL via INTRAVENOUS

## 2016-09-04 MED ORDER — SODIUM CHLORIDE 0.9 % IV SOLN
INTRAVENOUS | Status: DC
  Administered 2016-09-04: 15:00:00 via INTRAVENOUS

## 2016-09-04 MED ORDER — HYDROMORPHONE HCL 1 MG/ML IJ SOLN
1.0000 mg | Freq: Once | INTRAMUSCULAR | Status: AC
  Administered 2016-09-04: 1 mg via INTRAVENOUS
  Filled 2016-09-04 (×2): qty 1

## 2016-09-04 NOTE — ED Triage Notes (Addendum)
Patient c/o right hip and leg pain since Tuesday. Patient has Morphine that she can take every 4 hours and hasnt helped. Patient has Non-hodgkin's lymphoma and getting treatments every 4 months.

## 2016-09-04 NOTE — ED Provider Notes (Signed)
[facility] DEPT Provider Note   CSN: [id number] Arrival date & time: 09/04/16  1253     History   Chief Complaint Chief Complaint  Patient presents with  . Hip Pain  . Leg Pain    HPI Carrie [name] is a 75 y.o. female.  75 year old female presents with several week history of right-sided sharp. Pain worse with standing. Patient scheduled have MRI of the hip neck suite but pain has been so severe. Was seen by her physician and placed on morphine which she has been taking without relief. Denies any trauma. No fever or chills. Pain starts in her right lateral hip and goes down to the top of her knee. She does better with remaining still. Denies any prior history of hip surgery.      Past Medical History:  Diagnosis Date  . A-fib ([place])   . Anemia    "years ago"  . Atrial fibrillation with rapid ventricular response ([place]) 06/25/2015  . Cervical spondylosis without myelopathy 10/25/2013  . Dental bridge present   . DJD (degenerative joint disease)   . Elevated troponin 02/18/2015  . Follicular lymphoma grade 3a ([place]) 02/03/2015  . History of echocardiogram    Echo 12/16: EF 60-65%, no RWMA, severe LAE  . History of nuclear stress test    Myoview 1/17: EF 55%, Normal study. No ischemia or scar.  . Hypothyroid   . Nausea without vomiting 06/20/2015  . Pneumonia   . Stage III chronic kidney disease 07/01/2015  . Thrush of mouth and esophagus ([place]) 06/25/2015    Patient Active Problem List   Diagnosis Date Noted  . Dysuria 08/31/2016  . Acute back pain with sciatica, right 08/31/2016  . Chronic back pain 07/02/2016  . Quality of life palliative care encounter 03/02/2016  . Port catheter in place 03/01/2016  . Chronic atrial fibrillation ([place]) 09/08/2015  . Essential hypertension 07/02/2015  . Diabetes mellitus, likely due to steroids 06/27/2015  . Chronic kidney disease, stage III (moderate) 06/25/2015  . Grade 3a follicular lymphoma of lymph nodes of multiple regions ([place])  02/03/2015  . Hypothyroidism 09/09/2009    Past Surgical History:  Procedure Laterality Date  . ABDOMINAL HYSTERECTOMY    . APPENDECTOMY    . BACK SURGERY     X5-lumbar-fusion  . COLONOSCOPY    . DILATION AND CURETTAGE OF UTERUS    . LYMPH NODE BIOPSY Right 01/21/2015   Procedure: RIGHT GROIN LYMPH NODE BIOPSY;  Surgeon: Erroll Luna, MD;  Location: [facility];  Service: General;  Laterality: Right;  . Ovarian cyst resection    . patelar tendon transplants     Left/right  . PORTACATH PLACEMENT Right 02/13/2015   Procedure: INSERTION PORT-A-CATH WITH ULTRASOUND;  Surgeon: Erroll Luna, MD;  Location: [facility];  Service: General;  Laterality: Right;  . TOTAL KNEE ARTHROPLASTY  2011   Right    OB History    No data available       Home Medications    Prior to Admission medications   Medication Sig Start Date End Date Taking? Authorizing Provider  diltiazem (CARDIZEM CD) 240 MG 24 hr capsule Take 1 capsule (240 mg total) by mouth daily. 08/05/15  Yes Scott Joylene Draft, PA-C  acetaminophen (TYLENOL) 325 MG tablet Take 325 mg by mouth every 6 (six) hours as needed for moderate pain.     Historical Provider, MD  amoxicillin (AMOXIL) 500 MG tablet Take 2,000 mg by mouth See admin instructions. For dental appointment  Historical Provider, MD  apixaban (ELIQUIS) 2.5 MG TABS tablet Take 2.5 mg by mouth 2 (two) times daily.    Historical Provider, MD  calcium carbonate (TUMS) 500 MG chewable tablet Chew 2 tablets (400 mg of elemental calcium total) by mouth daily. Patient not taking: Reported on 09/04/2016 06/21/15   Volanda Napoleon, MD  diclofenac (FLECTOR) 1.3 % PTCH Place 1 patch onto the skin 2 (two) times daily.    Historical Provider, MD  HYDROcodone-acetaminophen (NORCO/VICODIN) 5-325 MG tablet Take 1 tablet by mouth every 6 (six) hours as needed. 08/04/16   [name], PA-C  levothyroxine (SYNTHROID, LEVOTHROID) 100 MCG tablet Take 100 mcg by mouth daily. 04/22/15    Historical Provider, MD  lidocaine-prilocaine (EMLA) cream Apply 1 application topically as needed. Apply to port a cath site one hour prior to needle stick 02/07/15   [name], MD  loratadine (CLARITIN) 10 MG tablet Take 10 mg by mouth daily as needed for allergies.     Historical Provider, MD  morphine (MSIR) 15 MG tablet Take 1 tablet (15 mg total) by mouth every 4 (four) hours as needed for severe pain. 08/31/16   [name], MD  ondansetron (ZOFRAN-ODT) 8 MG disintegrating tablet Take 8 mg by mouth every 8 (eight) hours as needed for nausea or vomiting.    Historical Provider, MD  prochlorperazine (COMPAZINE) 10 MG tablet Take 10 mg by mouth every 6 (six) hours as needed for nausea or vomiting.    Historical Provider, MD  RiTUXimab (RITUXAN IV) Inject into the vein every 8 (eight) weeks.    Historical Provider, MD  traMADol (ULTRAM) 50 MG tablet Take 50 mg by mouth every 6 (six) hours as needed for moderate pain.    Historical Provider, MD    Family History Family History  Problem Relation Age of Onset  . Cancer Mother     Breast, lung NHL  . Cancer Sister     Multiple myeloma    Social History Social History  Substance Use Topics  . Smoking status: Never Smoker  . Smokeless tobacco: Never Used  . Alcohol use Yes     Comment: occassional wine     Allergies   Patient has no known allergies.   Review of Systems Review of Systems  All other systems reviewed and are negative.    Physical Exam Updated Vital Signs BP 93/72 (BP Location: Right Arm)   Pulse 73   Temp 97.1 F (36.2 C) (Oral)   Resp 20   Ht _0  (1.549 m)   Wt 63 kg   SpO2 96%   BMI 26.26 kg/m   Physical Exam  Constitutional: She is oriented to person, place, and time. She appears well-developed and well-nourished.  Non-toxic appearance. No distress.  HENT:  Head: Normocephalic and atraumatic.  Eyes: Conjunctivae, EOM and lids are normal. Pupils are equal, round, and reactive to light.  Neck:  Normal range of motion. Neck supple. No tracheal deviation present. No thyroid mass present.  Cardiovascular: Normal rate, regular rhythm and normal heart sounds.  Exam reveals no gallop.   No murmur heard. Pulmonary/Chest: Effort normal and breath sounds normal. No stridor. No respiratory distress. She has no decreased breath sounds. She has no wheezes. She has no rhonchi. She has no rales.  Abdominal: Soft. Normal appearance and bowel sounds are normal. She exhibits no distension. There is no tenderness. There is no rebound and no CVA tenderness.  Musculoskeletal: She exhibits no edema or tenderness.  Right hip: She exhibits decreased range of motion and bony tenderness. She exhibits no swelling.       Legs: Neurological: She is alert and oriented to person, place, and time. She has normal strength. No cranial nerve deficit or sensory deficit. GCS eye subscore is 4. GCS verbal subscore is 5. GCS motor subscore is 6.  Skin: Skin is warm and dry. No abrasion and no rash noted.  Psychiatric: She has a normal mood and affect. Her speech is normal and behavior is normal.  Nursing note and vitals reviewed.    ED Treatments / Results  Labs (all labs ordered are listed, but only abnormal results are displayed) Labs Reviewed  COMPREHENSIVE METABOLIC PANEL  CBC WITH DIFFERENTIAL/PLATELET    EKG  EKG Interpretation None       Radiology No results found.  Procedures Procedures (including critical care time)  Medications Ordered in ED Medications  0.9 %  sodium chloride infusion (not administered)  LORazepam (ATIVAN) injection 1 mg (not administered)  HYDROmorphone (DILAUDID) injection 1 mg (not administered)     Initial Impression / Assessment and Plan / ED Course  I have reviewed the triage vital signs and the nursing notes.  Pertinent labs & imaging results that were available during my care of the patient were reviewed by me and considered in my medical decision making  (see chart for details).  Clinical Course     Patient medicated for pain here. MRI of hip ordered and is pending at this time. Care signed out to oncoming provider  Final Clinical Impressions(s) / ED Diagnoses   Final diagnoses:  Hip pain    New Prescriptions New Prescriptions   No medications on file     Lacretia Leigh, MD 09/06/16 1656

## 2016-09-04 NOTE — ED Provider Notes (Signed)
Received care of pt at [facility]. Please see Dr. Ayesha Rumpf note for prior care. Briefly this is 75yo female who presents with concern for hip pain. MR hip ordered.   MR shows avascular necrosis of right hip, tendinosis of gluteus medius tendons bilaterally, partial hamstring tears bilaterally. Recommend orthopedic follow up. Patient discharged in stable condition with understanding of reasons to return.     Gareth Morgan, MD 09/06/16 1313

## 2016-09-04 NOTE — ED Notes (Signed)
PT DISCHARGED. INSTRUCTIONS GIVEN. AAOX4. PT IN NO APPARENT DISTRESS. THE OPPORTUNITY TO ASK QUESTIONS WAS PROVIDED. 

## 2016-09-04 NOTE — ED Notes (Signed)
Pt has a port and would like that accessed.

## 2016-09-04 NOTE — ED Notes (Signed)
Patient transported to MRI 

## 2016-09-06 ENCOUNTER — Telehealth: Payer: Self-pay | Admitting: *Deleted

## 2016-09-06 DIAGNOSIS — M25551 Pain in right hip: Secondary | ICD-10-CM | POA: Diagnosis not present

## 2016-09-06 DIAGNOSIS — M791 Myalgia: Secondary | ICD-10-CM | POA: Diagnosis not present

## 2016-09-06 NOTE — Telephone Encounter (Signed)
Pt left message stating she went to ED on Saturday. Had MRI- has hip problem.  Wanted to make Dr Alvy Bimler aware

## 2016-09-06 NOTE — Telephone Encounter (Signed)
"  This is Carrie [name] with [facility] Dr. Paralee Cancel, [phone].  I'm calling in regards of this mutual patient.  One of the PA's here wants to speak with Dr. Alvy Bimler.  Please call and overhead page [facility] [place] or [place]."

## 2016-09-06 NOTE — Telephone Encounter (Signed)
I discussed the case with one of the physician assistant from the orthopedic practice. I would defer to them for further management of her hip pain

## 2016-09-06 NOTE — Telephone Encounter (Signed)
I believe she had mentioned she has an appointment pending to see the pain specialists MRI showed mainly orthopedic related issues, not evidence of cancer I would defer to their expertise to help treat her pain

## 2016-09-13 ENCOUNTER — Telehealth: Payer: Self-pay

## 2016-09-13 ENCOUNTER — Other Ambulatory Visit (HOSPITAL_COMMUNITY): Payer: Self-pay | Admitting: Orthopaedic Surgery

## 2016-09-13 ENCOUNTER — Telehealth: Payer: Self-pay | Admitting: *Deleted

## 2016-09-13 NOTE — Telephone Encounter (Signed)
Pt asks if ok to cancel her appt w/ Dr. Alvy Bimler tomorrow?  She is still dealing w/ back pain which makes it difficult to get out of the house and she has to arrange transportation.  She does not need refills on morphine this week. She sees Dr. Patrice Paradise at [facility] this Thursday.   She is waiting for them to schedule MRI and surgery.  She also had injection of Toradol last week which only helped "for a few hours."  Informed pt ok to cancel appt w/ Dr. Alvy Bimler if her back pain can be managed by Spine or Orthopedic doctor.  Please update [place] when she is going to have surgery.  Pt verbalized understanding.

## 2016-09-13 NOTE — Telephone Encounter (Signed)
Clarified message from [employer] with [facility]- manager:  The request for MRI lumbar spine with and without contrast was denied. To contact [place] call [phone] and reference case # [id number].

## 2016-09-14 ENCOUNTER — Ambulatory Visit: Payer: Medicare HMO | Admitting: Hematology and Oncology

## 2016-09-14 ENCOUNTER — Other Ambulatory Visit: Payer: Self-pay | Admitting: Hematology and Oncology

## 2016-09-14 NOTE — Telephone Encounter (Signed)
I moved her appt to 1/12 (same day as when her next Rituxan should be due) Scheduling msg sent

## 2016-09-16 ENCOUNTER — Other Ambulatory Visit (HOSPITAL_COMMUNITY): Payer: Self-pay | Admitting: Orthopaedic Surgery

## 2016-09-16 DIAGNOSIS — M47896 Other spondylosis, lumbar region: Secondary | ICD-10-CM

## 2016-09-16 DIAGNOSIS — M4316 Spondylolisthesis, lumbar region: Secondary | ICD-10-CM

## 2016-09-19 ENCOUNTER — Ambulatory Visit (HOSPITAL_COMMUNITY)
Admission: RE | Admit: 2016-09-19 | Discharge: 2016-09-19 | Disposition: A | Discharge status: Home / Self Care | Payer: Non-veteran care | Source: Ambulatory Visit | Attending: Orthopaedic Surgery | Admitting: Orthopaedic Surgery

## 2016-09-19 DIAGNOSIS — M48061 Spinal stenosis, lumbar region without neurogenic claudication: Secondary | ICD-10-CM | POA: Insufficient documentation

## 2016-09-19 DIAGNOSIS — M4316 Spondylolisthesis, lumbar region: Secondary | ICD-10-CM | POA: Insufficient documentation

## 2016-09-19 DIAGNOSIS — M47816 Spondylosis without myelopathy or radiculopathy, lumbar region: Secondary | ICD-10-CM | POA: Diagnosis not present

## 2016-09-19 DIAGNOSIS — M47896 Other spondylosis, lumbar region: Secondary | ICD-10-CM

## 2016-10-08 ENCOUNTER — Telehealth: Payer: Self-pay | Admitting: Hematology and Oncology

## 2016-10-08 NOTE — Telephone Encounter (Signed)
Faxed records to [facility] °

## 2016-10-22 ENCOUNTER — Telehealth: Payer: Self-pay | Admitting: *Deleted

## 2016-10-22 NOTE — Telephone Encounter (Signed)
Almost every day that week is full. The best I can do would be 1/19 to see her at 11 am, 30 mins

## 2016-10-22 NOTE — Telephone Encounter (Signed)
Pt states she is having back surgery on 11/09/16. Has pre-op appts on 1/12, so she needs to reschedule lab/flush/MD/chemo to another day, requested 10/28/16. I informed her that the infusion room is full that day.  Can do any day week of 1/15.

## 2016-10-26 ENCOUNTER — Emergency Department (HOSPITAL_COMMUNITY)
Admission: EM | Admit: 2016-10-26 | Discharge: 2016-10-27 | Disposition: A | Discharge status: Home / Self Care | Payer: Non-veteran care | Attending: Emergency Medicine | Admitting: Emergency Medicine

## 2016-10-26 ENCOUNTER — Encounter (HOSPITAL_COMMUNITY): Payer: Self-pay

## 2016-10-26 ENCOUNTER — Telehealth: Payer: Self-pay | Admitting: *Deleted

## 2016-10-26 DIAGNOSIS — E1122 Type 2 diabetes mellitus with diabetic chronic kidney disease: Secondary | ICD-10-CM | POA: Diagnosis not present

## 2016-10-26 DIAGNOSIS — N183 Chronic kidney disease, stage 3 (moderate): Secondary | ICD-10-CM | POA: Insufficient documentation

## 2016-10-26 DIAGNOSIS — Z7901 Long term (current) use of anticoagulants: Secondary | ICD-10-CM | POA: Diagnosis not present

## 2016-10-26 DIAGNOSIS — I129 Hypertensive chronic kidney disease with stage 1 through stage 4 chronic kidney disease, or unspecified chronic kidney disease: Secondary | ICD-10-CM | POA: Insufficient documentation

## 2016-10-26 DIAGNOSIS — R319 Hematuria, unspecified: Secondary | ICD-10-CM | POA: Diagnosis present

## 2016-10-26 DIAGNOSIS — N390 Urinary tract infection, site not specified: Secondary | ICD-10-CM | POA: Diagnosis not present

## 2016-10-26 DIAGNOSIS — E039 Hypothyroidism, unspecified: Secondary | ICD-10-CM | POA: Diagnosis not present

## 2016-10-26 DIAGNOSIS — Z96651 Presence of right artificial knee joint: Secondary | ICD-10-CM | POA: Diagnosis not present

## 2016-10-26 LAB — BASIC METABOLIC PANEL
Anion gap: 9 (ref 5–15)
BUN: 30 mg/dL — ABNORMAL HIGH (ref 6–20)
CO2: 26 mmol/L (ref 22–32)
Calcium: 9.3 mg/dL (ref 8.9–10.3)
Chloride: 104 mmol/L (ref 101–111)
Creatinine, Ser: 1.45 mg/dL — ABNORMAL HIGH (ref 0.44–1.00)
GFR calc Af Amer: 40 mL/min — ABNORMAL LOW (ref >60)
GFR calc non Af Amer: 35 mL/min — ABNORMAL LOW (ref >60)
Glucose, Bld: 107 mg/dL — ABNORMAL HIGH (ref 65–99)
Potassium: 4 mmol/L (ref 3.5–5.1)
Sodium: 139 mmol/L (ref 135–145)

## 2016-10-26 LAB — CBC WITH DIFFERENTIAL/PLATELET
Basophils Absolute: 0 10*3/uL (ref 0.0–0.1)
Basophils Relative: 0 %
Eosinophils Absolute: 0.1 10*3/uL (ref 0.0–0.7)
Eosinophils Relative: 2 %
HCT: 39.4 % (ref 36.0–46.0)
Hemoglobin: 12.9 g/dL (ref 12.0–15.0)
Lymphocytes Relative: 14 %
Lymphs Abs: 1.2 10*3/uL (ref 0.7–4.0)
MCH: 29.5 pg (ref 26.0–34.0)
MCHC: 32.7 g/dL (ref 30.0–36.0)
MCV: 90.2 fL (ref 78.0–100.0)
Monocytes Absolute: 0.7 10*3/uL (ref 0.1–1.0)
Monocytes Relative: 8 %
Neutro Abs: 6.3 10*3/uL (ref 1.7–7.7)
Neutrophils Relative %: 76 %
Platelets: 235 10*3/uL (ref 150–400)
RBC: 4.37 MIL/uL (ref 3.87–5.11)
RDW: 14 % (ref 11.5–15.5)
WBC: 8.3 10*3/uL (ref 4.0–10.5)

## 2016-10-26 LAB — URINALYSIS, ROUTINE W REFLEX MICROSCOPIC
Bilirubin Urine: NEGATIVE
Glucose, UA: NEGATIVE mg/dL
Ketones, ur: NEGATIVE mg/dL
Nitrite: NEGATIVE
Protein, ur: NEGATIVE mg/dL
Specific Gravity, Urine: 1.025 (ref 1.005–1.030)
pH: 5 (ref 5.0–8.0)

## 2016-10-26 MED ORDER — HYDROCODONE-ACETAMINOPHEN 5-325 MG PO TABS
1.0000 | ORAL_TABLET | Freq: Once | ORAL | Status: AC
  Administered 2016-10-26: 1 via ORAL
  Filled 2016-10-26: qty 1

## 2016-10-26 MED ORDER — CEPHALEXIN 500 MG PO CAPS
1000.0000 mg | ORAL_CAPSULE | Freq: Once | ORAL | Status: AC
  Administered 2016-10-26: 1000 mg via ORAL
  Filled 2016-10-26: qty 2

## 2016-10-26 MED ORDER — PHENAZOPYRIDINE HCL 200 MG PO TABS: 200.0000 mg | ORAL_TABLET | Freq: Three times a day (TID) | ORAL | 0 refills | Status: DC

## 2016-10-26 MED ORDER — CEPHALEXIN 500 MG PO CAPS: ORAL_CAPSULE | ORAL | 0 refills | Status: DC

## 2016-10-26 MED ORDER — PHENAZOPYRIDINE HCL 100 MG PO TABS
100.0000 mg | ORAL_TABLET | Freq: Once | ORAL | Status: AC
  Administered 2016-10-26: 100 mg via ORAL
  Filled 2016-10-26: qty 1

## 2016-10-26 MED ORDER — SODIUM CHLORIDE 0.9 % IV BOLUS (SEPSIS)
1000.0000 mL | Freq: Once | INTRAVENOUS | Status: AC
  Administered 2016-10-27: 1000 mL via INTRAVENOUS

## 2016-10-26 NOTE — Telephone Encounter (Signed)
Call from pt, she needs 1/12 appts changed. She can not come in that day. Noted message in chart from 1/5. Message to schedulers to contact pt.

## 2016-10-26 NOTE — ED Provider Notes (Signed)
[facility] DEPT Provider Note   CSN: [id number] Arrival date & time: 10/26/16  2052     History   Chief Complaint Chief Complaint  Patient presents with  . Urinary Frequency  . Hematuria    HPI Carrie [name] is a 76 y.o. female.   Hematuria  This is a recurrent problem. The current episode started 3 to 5 hours ago. The problem occurs constantly. The problem has not changed since onset.Associated symptoms include abdominal pain. Pertinent negatives include no chest pain and no shortness of breath. Nothing aggravates the symptoms. Nothing relieves the symptoms. She has tried nothing for the symptoms. The treatment provided no relief.    Past Medical History:  Diagnosis Date  . A-fib ([place])   . Anemia    "years ago"  . Atrial fibrillation with rapid ventricular response ([place]) 06/25/2015  . Cervical spondylosis without myelopathy 10/25/2013  . Dental bridge present   . DJD (degenerative joint disease)   . Elevated troponin 02/18/2015  . Follicular lymphoma grade 3a ([place]) 02/03/2015  . History of echocardiogram    Echo 12/16: EF 60-65%, no RWMA, severe LAE  . History of nuclear stress test    Myoview 1/17: EF 55%, Normal study. No ischemia or scar.  . Hypothyroid   . Nausea without vomiting 06/20/2015  . Pneumonia   . Stage III chronic kidney disease 07/01/2015  . Thrush of mouth and esophagus ([place]) 06/25/2015    Patient Active Problem List   Diagnosis Date Noted  . Dysuria 08/31/2016  . Acute back pain with sciatica, right 08/31/2016  . Chronic back pain 07/02/2016  . Quality of life palliative care encounter 03/02/2016  . Port catheter in place 03/01/2016  . Chronic atrial fibrillation ([place]) 09/08/2015  . Essential hypertension 07/02/2015  . Diabetes mellitus, likely due to steroids 06/27/2015  . Chronic kidney disease, stage III (moderate) 06/25/2015  . Grade 3a follicular lymphoma of lymph nodes of multiple regions ([place]) 02/03/2015  . Hypothyroidism 09/09/2009     Past Surgical History:  Procedure Laterality Date  . ABDOMINAL HYSTERECTOMY    . APPENDECTOMY    . BACK SURGERY     X5-lumbar-fusion  . COLONOSCOPY    . DILATION AND CURETTAGE OF UTERUS    . LYMPH NODE BIOPSY Right 01/21/2015   Procedure: RIGHT GROIN LYMPH NODE BIOPSY;  Surgeon: Erroll Luna, MD;  Location: [facility];  Service: General;  Laterality: Right;  . Ovarian cyst resection    . patelar tendon transplants     Left/right  . PORTACATH PLACEMENT Right 02/13/2015   Procedure: INSERTION PORT-A-CATH WITH ULTRASOUND;  Surgeon: Erroll Luna, MD;  Location: [facility];  Service: General;  Laterality: Right;  . TOTAL KNEE ARTHROPLASTY  2011   Right    OB History    No data available       Home Medications    Prior to Admission medications   Medication Sig Start Date End Date Taking? Authorizing Provider  acetaminophen (TYLENOL) 500 MG tablet Take 500 mg by mouth every 6 (six) hours as needed for moderate pain.   Yes Historical Provider, MD  apixaban (ELIQUIS) 5 MG TABS tablet Take 5 mg by mouth 2 (two) times daily.    Yes Historical Provider, MD  diclofenac (FLECTOR) 1.3 % PTCH Place 1 patch onto the skin 2 (two) times daily.   Yes Historical Provider, MD  diltiazem (CARDIZEM CD) 240 MG 24 hr capsule Take 1 capsule (240 mg total) by mouth daily. 08/05/15  Yes Liliane Shi, PA-C  diphenhydrAMINE (BENADRYL) 25 mg capsule Take 25 mg by mouth at bedtime as needed for sleep.   Yes Historical Provider, MD  gabapentin (NEURONTIN) 300 MG capsule Take 300 mg by mouth 2 (two) times daily.   Yes Historical Provider, MD  HYDROcodone-acetaminophen (NORCO/VICODIN) 5-325 MG tablet Take 1 tablet by mouth every 6 (six) hours as needed. 08/04/16  Yes [name], PA-C  levothyroxine (SYNTHROID, LEVOTHROID) 100 MCG tablet Take 100 mcg by mouth daily. 04/22/15  Yes Historical Provider, MD  lidocaine-prilocaine (EMLA) cream Apply 1 application topically as needed. Apply to port a  cath site one hour prior to needle stick 02/07/15  Yes [name], MD  loratadine (CLARITIN) 10 MG tablet Take 10 mg by mouth daily as needed for allergies.    Yes Historical Provider, MD  RiTUXimab (RITUXAN IV) Inject into the vein every 8 (eight) weeks.   Yes Historical Provider, MD  trolamine salicylate (ASPERCREME) 10 % cream Apply 1 application topically as needed for muscle pain.   Yes Historical Provider, MD  calcium carbonate (TUMS) 500 MG chewable tablet Chew 2 tablets (400 mg of elemental calcium total) by mouth daily. Patient not taking: Reported on 10/26/2016 06/21/15   Volanda Napoleon, MD  cephALEXin [facility]) 500 MG capsule 2 caps po bid x 7 days 10/26/16   Merrily Pew, MD  phenazopyridine (PYRIDIUM) 200 MG tablet Take 1 tablet (200 mg total) by mouth 3 (three) times daily. 10/26/16   Merrily Pew, MD    Family History Family History  Problem Relation Age of Onset  . Cancer Mother     Breast, lung NHL  . Cancer Sister     Multiple myeloma    Social History Social History  Substance Use Topics  . Smoking status: Never Smoker  . Smokeless tobacco: Never Used  . Alcohol use Yes     Comment: occassional wine     Allergies   Patient has no known allergies.   Review of Systems Review of Systems  Respiratory: Negative for shortness of breath.   Cardiovascular: Negative for chest pain.  Gastrointestinal: Positive for abdominal pain.  Genitourinary: Positive for hematuria.  All other systems reviewed and are negative.    Physical Exam Updated Vital Signs BP 127/80 (BP Location: Right Arm)   Pulse 73   Temp 97.7 F (36.5 C) (Oral)   Resp 14   Ht '5\' 1"'  (1.549 m)   Wt 139 lb (63 kg)   SpO2 94%   BMI 26.26 kg/m   Physical Exam  Constitutional: She is oriented to person, place, and time. She appears well-developed and well-nourished.  HENT:  Head: Normocephalic and atraumatic.  Eyes: Conjunctivae and EOM are normal. Right eye exhibits no discharge. Left eye exhibits  no discharge.  Neck: Normal range of motion.  Cardiovascular: Normal rate and regular rhythm.   Pulmonary/Chest: No stridor. No respiratory distress. She has no wheezes. She has no rales.  Abdominal: She exhibits no distension. There is tenderness (suprapubic).  Musculoskeletal: She exhibits no edema.  Neurological: She is alert and oriented to person, place, and time. No cranial nerve deficit. Coordination normal.  Skin: Skin is warm and dry.  Nursing note and vitals reviewed.    ED Treatments / Results  Labs (all labs ordered are listed, but only abnormal results are displayed) Labs Reviewed  URINALYSIS, ROUTINE W REFLEX MICROSCOPIC - Abnormal; Notable for the following:       Result Value   APPearance HAZY (*)  Hgb urine dipstick MODERATE (*)    Leukocytes, UA LARGE (*)    Bacteria, UA FEW (*)    Squamous Epithelial / LPF 0-5 (*)    All other components within normal limits  BASIC METABOLIC PANEL - Abnormal; Notable for the following:    Glucose, Bld 107 (*)    BUN 30 (*)    Creatinine, Ser 1.45 (*)    GFR calc non Af Amer 35 (*)    GFR calc Af Amer 40 (*)    All other components within normal limits  URINE CULTURE  CBC WITH DIFFERENTIAL/PLATELET    EKG  EKG Interpretation None       Radiology No results found.  Procedures Procedures (including critical care time)  Medications Ordered in ED Medications  phenazopyridine (PYRIDIUM) tablet 100 mg (100 mg Oral Given 10/26/16 2220)  cephALEXin (KEFLEX) capsule 1,000 mg (1,000 mg Oral Given 10/26/16 2220)  HYDROcodone-acetaminophen (NORCO/VICODIN) 5-325 MG per tablet 1 tablet (1 tablet Oral Given 10/26/16 2220)  sodium chloride 0.9 % bolus 1,000 mL (0 mLs Intravenous Stopped 10/27/16 0039)  morphine 4 MG/ML injection 6 mg (6 mg Intravenous Given 10/27/16 0011)     Initial Impression / Assessment and Plan / ED Course  I have reviewed the triage vital signs and the nursing notes.  Pertinent labs & imaging results  that were available during my care of the patient were reviewed by me and considered in my medical decision making (see chart for details).  Clinical Course    Likely UTi. Has h/o of NHL, so will check cbc to ensure wbc appropriate. otherwise will start keflex and dc on same. Urine culture added. Patient with exacerbation of chronic back pain as well so will treat with home meds.  Labs ok. Treated for UTI, Rx for same. Stable for dc.   Final Clinical Impressions(s) / ED Diagnoses   Final diagnoses:  Lower urinary tract infectious disease    New Prescriptions Discharge Medication List as of 10/26/2016 11:58 PM    START taking these medications   Details  cephALEXin (KEFLEX) 500 MG capsule 2 caps po bid x 7 days, Print    phenazopyridine (PYRIDIUM) 200 MG tablet Take 1 tablet (200 mg total) by mouth 3 (three) times daily., Starting Tue 10/26/2016, Print         Merrily Pew, MD 10/27/16 [phone]

## 2016-10-26 NOTE — ED Triage Notes (Addendum)
PT C/O URINARY FREQUENCY AND HEMATURIA SINCE 3 PM TODAY. PT STS SHE HAS NON-HODGKIN'S LYMPHOMA, AND IS ON RETUXAN. PT IS ALSO TAKING ELOQUIS. PT DENIES FEVER OR ABDOMINAL PAIN.

## 2016-10-26 NOTE — ED Notes (Signed)
Bed: [id number] Expected date:  Expected time:  Means of arrival:  Comments: Hold for [employer]

## 2016-10-27 ENCOUNTER — Telehealth: Payer: Self-pay | Admitting: *Deleted

## 2016-10-27 MED ORDER — MORPHINE SULFATE (PF) 4 MG/ML IV SOLN
6.0000 mg | Freq: Once | INTRAVENOUS | Status: AC
  Administered 2016-10-27: 6 mg via INTRAVENOUS
  Filled 2016-10-27: qty 2

## 2016-10-27 NOTE — Telephone Encounter (Signed)
Received note that pt called our office last night to report Hematuria.  Call Center Nurse instructed pt to go to ED. Pt was treated in ED for UTI and sent with Rx for Keflex.  I called pt to check on her.  She says she is feeling a little better.  No further bleeding.  She is taking antibiotic as prescribed.  She says this is the third time recently she has had UTI and this concerns her.   Offered to make Urology referral but pt says referral will need to be done through the [place] and they have Urologist at [facility] in [place].  She will contact her PCP to refer to [facility] Urologist.   Pt confirmed her appts here next week on 1/19.  Instructed her to call if her symptoms do not improve on current antibiotic.  She verbalized understanding.

## 2016-10-28 LAB — URINE CULTURE

## 2016-10-29 ENCOUNTER — Other Ambulatory Visit: Payer: Medicare HMO

## 2016-10-29 ENCOUNTER — Ambulatory Visit: Payer: Medicare HMO | Admitting: Hematology and Oncology

## 2016-10-29 ENCOUNTER — Ambulatory Visit: Payer: Medicare HMO

## 2016-10-29 DIAGNOSIS — Z01818 Encounter for other preprocedural examination: Secondary | ICD-10-CM | POA: Diagnosis not present

## 2016-11-05 ENCOUNTER — Telehealth: Payer: Self-pay | Admitting: *Deleted

## 2016-11-05 ENCOUNTER — Other Ambulatory Visit: Payer: Self-pay | Admitting: Hematology and Oncology

## 2016-11-05 ENCOUNTER — Other Ambulatory Visit: Payer: Medicare HMO

## 2016-11-05 ENCOUNTER — Ambulatory Visit: Payer: Medicare HMO | Admitting: Hematology and Oncology

## 2016-11-05 ENCOUNTER — Ambulatory Visit: Payer: Medicare HMO

## 2016-11-05 NOTE — Telephone Encounter (Signed)
Pt is having back surgery on 1/23. She will call us back once she has had the surgery to reschedule appts.

## 2016-11-05 NOTE — Telephone Encounter (Signed)
"  I can't come in today due to the weather.  I'll call another time to reschedule.  I'm scheduled for surgery Tuesday."  Shared with her the main roads are cleared well with no problems or accidents but would have to be careful on side roads.  Thanked me but said she "does not wish to try come in today".   Will notify provider.

## 2016-11-05 NOTE — Telephone Encounter (Signed)
Please cancel all her appt today My next available would be 1/29; she needs labs, flush, see me at 1030 am, 30 mins and rituxan same day Please send scheduling msg to reschedule

## 2016-11-15 ENCOUNTER — Ambulatory Visit: Payer: Medicare HMO | Admitting: Hematology and Oncology

## 2016-11-15 ENCOUNTER — Ambulatory Visit: Payer: Medicare HMO

## 2016-11-15 ENCOUNTER — Other Ambulatory Visit: Payer: Medicare HMO

## 2016-11-25 ENCOUNTER — Telehealth: Payer: Self-pay | Admitting: *Deleted

## 2016-11-25 ENCOUNTER — Other Ambulatory Visit: Payer: Self-pay | Admitting: Hematology and Oncology

## 2016-11-25 NOTE — Telephone Encounter (Signed)
Carrie [name] has had back surgery on 1/23 and is ready to be rescheduled for treatments. Is available any days except 2/13 and 2/23- has MD appts those days

## 2016-11-25 NOTE — Telephone Encounter (Signed)
I palced scheduling msg to see her back 2/21 with chemo

## 2016-11-26 ENCOUNTER — Telehealth: Payer: Self-pay | Admitting: Hematology and Oncology

## 2016-11-26 NOTE — Telephone Encounter (Signed)
lvm to inform pt of 2/21 appts per LOS

## 2016-12-08 ENCOUNTER — Ambulatory Visit (HOSPITAL_BASED_OUTPATIENT_CLINIC_OR_DEPARTMENT_OTHER): Payer: Medicare HMO | Admitting: Hematology and Oncology

## 2016-12-08 ENCOUNTER — Ambulatory Visit: Payer: Non-veteran care

## 2016-12-08 ENCOUNTER — Ambulatory Visit (HOSPITAL_BASED_OUTPATIENT_CLINIC_OR_DEPARTMENT_OTHER): Payer: Medicare HMO

## 2016-12-08 ENCOUNTER — Other Ambulatory Visit (HOSPITAL_BASED_OUTPATIENT_CLINIC_OR_DEPARTMENT_OTHER): Payer: Non-veteran care

## 2016-12-08 ENCOUNTER — Telehealth: Payer: Self-pay | Admitting: Hematology and Oncology

## 2016-12-08 VITALS — BP 141/79 | HR 77 | Temp 98.9°F | Resp 17

## 2016-12-08 DIAGNOSIS — I482 Chronic atrial fibrillation, unspecified: Secondary | ICD-10-CM

## 2016-12-08 DIAGNOSIS — C8238 Follicular lymphoma grade IIIa, lymph nodes of multiple sites: Secondary | ICD-10-CM

## 2016-12-08 DIAGNOSIS — D62 Acute posthemorrhagic anemia: Secondary | ICD-10-CM | POA: Diagnosis not present

## 2016-12-08 DIAGNOSIS — Z5112 Encounter for antineoplastic immunotherapy: Secondary | ICD-10-CM

## 2016-12-08 DIAGNOSIS — N183 Chronic kidney disease, stage 3 unspecified: Secondary | ICD-10-CM

## 2016-12-08 DIAGNOSIS — L089 Local infection of the skin and subcutaneous tissue, unspecified: Secondary | ICD-10-CM

## 2016-12-08 DIAGNOSIS — Z95828 Presence of other vascular implants and grafts: Secondary | ICD-10-CM

## 2016-12-08 LAB — CBC WITH DIFFERENTIAL/PLATELET
BASO%: 0.6 % (ref 0.0–2.0)
Basophils Absolute: 0 10*3/uL (ref 0.0–0.1)
EOS%: 4.6 % (ref 0.0–7.0)
Eosinophils Absolute: 0.2 10*3/uL (ref 0.0–0.5)
HCT: 34.7 % — ABNORMAL LOW (ref 34.8–46.6)
HGB: 11.4 g/dL — ABNORMAL LOW (ref 11.6–15.9)
LYMPH%: 11.2 % — ABNORMAL LOW (ref 14.0–49.7)
MCH: 29.9 pg (ref 25.1–34.0)
MCHC: 33 g/dL (ref 31.5–36.0)
MCV: 90.7 fL (ref 79.5–101.0)
MONO#: 0.6 10*3/uL (ref 0.1–0.9)
MONO%: 11 % (ref 0.0–14.0)
NEUT#: 3.7 10*3/uL (ref 1.5–6.5)
NEUT%: 72.6 % (ref 38.4–76.8)
Platelets: 266 10*3/uL (ref 145–400)
RBC: 3.83 10*6/uL (ref 3.70–5.45)
RDW: 14.4 % (ref 11.2–14.5)
WBC: 5.1 10*3/uL (ref 3.9–10.3)
lymph#: 0.6 10*3/uL — ABNORMAL LOW (ref 0.9–3.3)

## 2016-12-08 LAB — COMPREHENSIVE METABOLIC PANEL
ALT: 9 U/L (ref 0–55)
AST: 13 U/L (ref 5–34)
Albumin: 3.8 g/dL (ref 3.5–5.0)
Alkaline Phosphatase: 117 U/L (ref 40–150)
Anion Gap: 10 mEq/L (ref 3–11)
BUN: 20.5 mg/dL (ref 7.0–26.0)
CO2: 24 mEq/L (ref 22–29)
Calcium: 9.6 mg/dL (ref 8.4–10.4)
Chloride: 106 mEq/L (ref 98–109)
Creatinine: 1 mg/dL (ref 0.6–1.1)
EGFR: 54 mL/min/{1.73_m2} — ABNORMAL LOW (ref >90)
Glucose: 131 mg/dl (ref 70–140)
Potassium: 3.9 mEq/L (ref 3.5–5.1)
Sodium: 141 mEq/L (ref 136–145)
Total Bilirubin: 0.41 mg/dL (ref 0.20–1.20)
Total Protein: 6.6 g/dL (ref 6.4–8.3)

## 2016-12-08 LAB — LACTATE DEHYDROGENASE: LDH: 191 U/L (ref 125–245)

## 2016-12-08 MED ORDER — SODIUM CHLORIDE 0.9 % IV SOLN
375.0000 mg/m2 | Freq: Once | INTRAVENOUS | Status: AC
  Administered 2016-12-08: 600 mg via INTRAVENOUS
  Filled 2016-12-08: qty 10

## 2016-12-08 MED ORDER — LIDOCAINE-PRILOCAINE 2.5-2.5 % EX CREA: 1.0000 "application " | TOPICAL_CREAM | CUTANEOUS | 6 refills | Status: DC | PRN

## 2016-12-08 MED ORDER — SODIUM CHLORIDE 0.9 % IJ SOLN
10.0000 mL | INTRAMUSCULAR | Status: DC | PRN
  Administered 2016-12-08: 10 mL via INTRAVENOUS
  Filled 2016-12-08: qty 10

## 2016-12-08 MED ORDER — HEPARIN SOD (PORK) LOCK FLUSH 100 UNIT/ML IV SOLN
500.0000 [IU] | Freq: Once | INTRAVENOUS | Status: AC | PRN
  Administered 2016-12-08: 500 [IU]
  Filled 2016-12-08: qty 5

## 2016-12-08 MED ORDER — SODIUM CHLORIDE 0.9 % IV SOLN
Freq: Once | INTRAVENOUS | Status: AC
  Administered 2016-12-08: 10:00:00 via INTRAVENOUS

## 2016-12-08 MED ORDER — CEPHALEXIN 500 MG PO CAPS: 500.0000 mg | ORAL_CAPSULE | Freq: Three times a day (TID) | ORAL | 0 refills | Status: DC

## 2016-12-08 MED ORDER — SODIUM CHLORIDE 0.9 % IJ SOLN
10.0000 mL | INTRAMUSCULAR | Status: DC | PRN
  Administered 2016-12-08: 10 mL
  Filled 2016-12-08: qty 10

## 2016-12-08 MED ORDER — DIPHENHYDRAMINE HCL 25 MG PO CAPS
50.0000 mg | ORAL_CAPSULE | Freq: Once | ORAL | Status: AC
  Administered 2016-12-08: 50 mg via ORAL

## 2016-12-08 MED ORDER — ACETAMINOPHEN 325 MG PO TABS: 650.0000 mg | ORAL_TABLET | Freq: Once | ORAL | Status: DC

## 2016-12-08 MED ORDER — DIPHENHYDRAMINE HCL 25 MG PO CAPS
ORAL_CAPSULE | ORAL | Status: AC
  Filled 2016-12-08: qty 2

## 2016-12-08 NOTE — Telephone Encounter (Signed)
Gave patient avs report and appointments for April  °

## 2016-12-08 NOTE — Patient Instructions (Signed)
[facility] Discharge Instructions for Patients Receiving Chemotherapy  Today you received the following chemotherapy agents: Rituxan   To help prevent nausea and vomiting after your treatment, we encourage you to take your nausea medication as directed.    If you develop nausea and vomiting that is not controlled by your nausea medication, call the clinic.   BELOW ARE SYMPTOMS THAT SHOULD BE REPORTED IMMEDIATELY:  *FEVER GREATER THAN 100.5 F  *CHILLS WITH OR WITHOUT FEVER  NAUSEA AND VOMITING THAT IS NOT CONTROLLED WITH YOUR NAUSEA MEDICATION  *UNUSUAL SHORTNESS OF BREATH  *UNUSUAL BRUISING OR BLEEDING  TENDERNESS IN MOUTH AND THROAT WITH OR WITHOUT PRESENCE OF ULCERS  *URINARY PROBLEMS  *BOWEL PROBLEMS  UNUSUAL RASH Items with * indicate a potential emergency and should be followed up as soon as possible.  Feel free to call the clinic you have any questions or concerns. The clinic phone number is [phone].  Please show the CHEMO ALERT CARD at check-in to the Emergency Department and triage nurse.   

## 2016-12-08 NOTE — Progress Notes (Signed)
Pt and VS stable at discharge. 

## 2016-12-09 ENCOUNTER — Other Ambulatory Visit: Payer: Self-pay | Admitting: Hematology and Oncology

## 2016-12-09 ENCOUNTER — Encounter: Payer: Self-pay | Admitting: Hematology and Oncology

## 2016-12-09 ENCOUNTER — Other Ambulatory Visit: Payer: Self-pay | Admitting: *Deleted

## 2016-12-09 DIAGNOSIS — D62 Acute posthemorrhagic anemia: Secondary | ICD-10-CM | POA: Insufficient documentation

## 2016-12-09 DIAGNOSIS — C8238 Follicular lymphoma grade IIIa, lymph nodes of multiple sites: Secondary | ICD-10-CM

## 2016-12-09 DIAGNOSIS — L089 Local infection of the skin and subcutaneous tissue, unspecified: Secondary | ICD-10-CM | POA: Insufficient documentation

## 2016-12-09 DIAGNOSIS — C801 Malignant (primary) neoplasm, unspecified: Secondary | ICD-10-CM

## 2016-12-09 NOTE — Assessment & Plan Note (Signed)
She is currently rate controlled. She will continue medical management She is on chronic anticoagulation therapy.

## 2016-12-09 NOTE — Progress Notes (Signed)
[facility] OFFICE PROGRESS NOTE  Patient Care Team: Arvella Nigh, MD as PCP - General (Obstetrics and Gynecology) Carola Frost, RN as Registered Nurse (Medical Oncology) [name], MD as Attending Physician (Internal Medicine)  SUMMARY OF ONCOLOGIC HISTORY:   Grade 3a follicular lymphoma of lymph nodes of multiple regions [facility])   01/21/2015 Surgery    She underwent excisional lymph node biopsy that came back follicular lymphoma grade 3      01/21/2015 Pathology Results    Accession: [id number] biopsy confirmed follicular lymphoma      02/07/2015 Imaging    Echocardiogram showed ejection fraction of 55-60%      02/11/2015 Imaging     PET CT scan show possible splenic involvement and diffuse lymphadenopathy throughout      02/11/2015 Bone Marrow Biopsy     bone marrow biopsy was performed and is involved by lymphoma with translocation of igH/BCL2      02/13/2015 Procedure    She had port placement.      02/17/2015 - 06/02/2015 Chemotherapy    She received R-CHOP chemo x 6      02/17/2015 Adverse Reaction    She had mild infusion reaction with cycle 1 of treatment.      04/18/2015 Imaging    PET CT scan showed near complete response to treatment.      04/22/2015 Adverse Reaction    Vincristine dose was reduced by 50% due to neuropathy from cycle 4 onwards      06/25/2015 - 07/06/2015 Hospital Admission    She was hospitalized for recent sepsis/bacteremia and a fib with RVR      07/11/2015 Imaging    repeat PEt scan showed complete response to Rx      07/14/2015 -  Chemotherapy    She received maintenance Rituximab every 60 days      12/15/2015 Imaging    PET CT showed no evidence of cancer recurrence      06/15/2016 PET scan    No evidence of active lymphoma on skullbase to thigh FDG PET scan. Small LEFT periaortic retroperitoneal lymph nodes without significant metabolic activity ( [place] 1). No change from prior       INTERVAL HISTORY: Please  see below for problem oriented charting. She returns for further follow-up and for treatment. Her treatment was delayed recently due to back surgery. She is healing well. She denies pain. She expressed concern about an area of redness near the postop surgical site. She is taking chronic anticoagulation therapy for her heart condition. She denies recent chest pain, shortness of breath or dizziness. The patient denies any recent signs or symptoms of bleeding such as spontaneous epistaxis, hematuria or hematochezia.  REVIEW OF SYSTEMS:   Constitutional: Denies fevers, chills or abnormal weight loss Eyes: Denies blurriness of vision Ears, nose, mouth, throat, and face: Denies mucositis or sore throat Respiratory: Denies cough, dyspnea or wheezes Cardiovascular: Denies palpitation, chest discomfort or lower extremity swelling Gastrointestinal:  Denies nausea, heartburn or change in bowel habits Lymphatics: Denies new lymphadenopathy or easy bruising Neurological:Denies numbness, tingling or new weaknesses Behavioral/Psych: Mood is stable, no new changes  All other systems were reviewed with the patient and are negative.  I have reviewed the past medical history, past surgical history, social history and family history with the patient and they are unchanged from previous note.  ALLERGIES:  has No Known Allergies.  MEDICATIONS:  Current Outpatient Prescriptions  Medication Sig Dispense Refill  . apixaban (ELIQUIS) 5 MG  TABS tablet Take 5 mg by mouth 2 (two) times daily.     Marland Kitchen diltiazem (CARDIZEM CD) 240 MG 24 hr capsule Take 1 capsule (240 mg total) by mouth daily. 30 capsule 5  . diphenhydrAMINE (BENADRYL) 25 mg capsule Take 25 mg by mouth at bedtime as needed for sleep.    Marland Kitchen gabapentin (NEURONTIN) 300 MG capsule Take 300 mg by mouth 2 (two) times daily.    Marland Kitchen levothyroxine (SYNTHROID, LEVOTHROID) 100 MCG tablet Take 100 mcg by mouth daily.  9  . lidocaine-prilocaine (EMLA) cream Apply 1  application topically as needed. Apply to port a cath site one hour prior to needle stick 30 g 2  . methocarbamol (ROBAXIN) 750 MG tablet Take 750 mg by mouth.    . NON FORMULARY Take by mouth.    . RiTUXimab (RITUXAN IV) Inject into the vein every 8 (eight) weeks.    . calcium carbonate (TUMS) 500 MG chewable tablet Chew 2 tablets (400 mg of elemental calcium total) by mouth daily. (Patient not taking: Reported on 10/26/2016) 2 tablet 0  . cephALEXin (KEFLEX) 500 MG capsule Take 1 capsule (500 mg total) by mouth 3 (three) times daily. 30 capsule 0  . HYDROcodone-acetaminophen (NORCO/VICODIN) 5-325 MG tablet Take 1 tablet by mouth every 6 (six) hours as needed. (Patient not taking: Reported on 12/08/2016) 10 tablet 0  . lidocaine-prilocaine (EMLA) cream Apply 1 application topically as needed. 30 g 6   No current facility-administered medications for this visit.    Facility-Administered Medications Ordered in Other Visits  Medication Dose Route Frequency Provider Last Rate Last Dose  . ondansetron (ZOFRAN) 8 mg in sodium chloride 0.9 % 50 mL IVPB   Intravenous Once [name], MD        PHYSICAL EXAMINATION: ECOG PERFORMANCE STATUS: 1 - Symptomatic but completely ambulatory  Vitals:   12/08/16 0857  BP: 124/70  Pulse: 82  Resp: 17  Temp: 97.7 F (36.5 C)   Filed Weights   12/08/16 0857  Weight: 142 lb 9.6 oz (64.7 kg)    GENERAL:alert, no distress and comfortable SKIN: skin color, texture, turgor are normal, no rashes or significant lesions EYES: normal, Conjunctiva are pink and non-injected, sclera clear OROPHARYNX:no exudate, no erythema and lips, buccal mucosa, and tongue normal  NECK: supple, thyroid normal size, non-tender, without nodularity LYMPH:  no palpable lymphadenopathy in the cervical, axillary or inguinal LUNGS: clear to auscultation and percussion with normal breathing effort HEART: regular rate & rhythm and no murmurs and no lower extremity edema ABDOMEN:abdomen  soft, non-tender and normal bowel sounds Musculoskeletal:no cyanosis of digits and no clubbing  NEURO: alert & oriented x 3 with fluent speech, no focal motor/sensory deficits  LABORATORY DATA:  I have reviewed the data as listed    Component Value Date/Time   NA 141 12/08/2016 0824   K 3.9 12/08/2016 0824   CL 104 10/26/2016 2215   CO2 24 12/08/2016 0824   GLUCOSE 131 12/08/2016 0824   BUN 20.5 12/08/2016 0824   CREATININE 1.0 12/08/2016 0824   CALCIUM 9.6 12/08/2016 0824   PROT 6.6 12/08/2016 0824   ALBUMIN 3.8 12/08/2016 0824   AST 13 12/08/2016 0824   ALT 9 12/08/2016 0824   ALKPHOS 117 12/08/2016 0824   BILITOT 0.41 12/08/2016 0824   GFRNONAA 35 (L) 10/26/2016 2215   GFRAA 40 (L) 10/26/2016 2215    No results found for: SPEP, UPEP  Lab Results  Component Value Date   WBC 5.1 12/08/2016  NEUTROABS 3.7 12/08/2016   HGB 11.4 (L) 12/08/2016   HCT 34.7 (L) 12/08/2016   MCV 90.7 12/08/2016   PLT 266 12/08/2016      Chemistry      Component Value Date/Time   NA 141 12/08/2016 0824   K 3.9 12/08/2016 0824   CL 104 10/26/2016 2215   CO2 24 12/08/2016 0824   BUN 20.5 12/08/2016 0824   CREATININE 1.0 12/08/2016 0824      Component Value Date/Time   CALCIUM 9.6 12/08/2016 0824   ALKPHOS 117 12/08/2016 0824   AST 13 12/08/2016 0824   ALT 9 12/08/2016 0824   BILITOT 0.41 12/08/2016 0824        ASSESSMENT & PLAN:  Grade 3a follicular lymphoma of lymph nodes of multiple regions [facility]) Recent PET CT scan in August 2017 showed no evidence of disease. We will continue rituximab every 60 days for 2 years. She is not due for another imaging study until August 2018 We will proceed with treatment today.  Chronic atrial fibrillation (HCC) She is currently rate controlled. She will continue medical management She is on chronic anticoagulation therapy.    Skin infection She is noted to have skin redness on her back. It could represent mild skin infection. I took  a picture of the area. I gave her prescription antibiotics to hang onto but I recommend she calls her surgeon for urgent evaluation.  Chronic kidney disease, stage III (moderate) She has chronic kidney disease stage III.  This is likely related to cardiac issues Serum creatinine is stable. Continue medical management  Postoperative anemia due to acute blood loss She has mild anemia likely due to recent blood loss. She is not symptomatic.  Observe closely.   No orders of the defined types were placed in this encounter.  All questions were answered. The patient knows to call the clinic with any problems, questions or concerns. No barriers to learning was detected. I spent 25 minutes counseling the patient face to face. The total time spent in the appointment was 40 minutes and more than 50% was on counseling and review of test results     [name], MD 12/09/2016 7:37 AM

## 2016-12-09 NOTE — Assessment & Plan Note (Signed)
She has mild anemia likely due to recent blood loss. She is not symptomatic.  Observe closely.

## 2016-12-09 NOTE — Assessment & Plan Note (Signed)
She has chronic kidney disease stage III.  This is likely related to cardiac issues Serum creatinine is stable. Continue medical management 

## 2016-12-09 NOTE — Assessment & Plan Note (Signed)
Recent PET CT scan in August 2017 showed no evidence of disease. We will continue rituximab every 60 days for 2 years. She is not due for another imaging study until August 2018 We will proceed with treatment today. 

## 2016-12-09 NOTE — Assessment & Plan Note (Signed)
She is noted to have skin redness on her back. It could represent mild skin infection. I took a picture of the area. I gave her prescription antibiotics to hang onto but I recommend she calls her surgeon for urgent evaluation.

## 2016-12-13 ENCOUNTER — Encounter: Payer: Self-pay | Admitting: Hematology and Oncology

## 2016-12-13 NOTE — Progress Notes (Signed)
A message was left on my voicemail with PA approval for Rituxan for Carrie [name] dob 12/14/1940.  Approval#[id number] 12/08/16-03/08/17 Holly w/ [email or web address] [phone]  Emailed to Otho Perl and  [email or web address] .

## 2017-02-04 ENCOUNTER — Encounter: Payer: Self-pay | Admitting: *Deleted

## 2017-02-04 ENCOUNTER — Other Ambulatory Visit (HOSPITAL_BASED_OUTPATIENT_CLINIC_OR_DEPARTMENT_OTHER): Payer: Non-veteran care

## 2017-02-04 ENCOUNTER — Ambulatory Visit (HOSPITAL_BASED_OUTPATIENT_CLINIC_OR_DEPARTMENT_OTHER): Payer: Medicare HMO

## 2017-02-04 ENCOUNTER — Telehealth: Payer: Self-pay | Admitting: Hematology and Oncology

## 2017-02-04 ENCOUNTER — Ambulatory Visit: Payer: Non-veteran care

## 2017-02-04 ENCOUNTER — Ambulatory Visit (HOSPITAL_BASED_OUTPATIENT_CLINIC_OR_DEPARTMENT_OTHER): Payer: Medicare HMO | Admitting: Hematology and Oncology

## 2017-02-04 VITALS — BP 123/74 | HR 76 | Temp 98.9°F | Resp 18

## 2017-02-04 DIAGNOSIS — C8238 Follicular lymphoma grade IIIa, lymph nodes of multiple sites: Secondary | ICD-10-CM

## 2017-02-04 DIAGNOSIS — I482 Chronic atrial fibrillation, unspecified: Secondary | ICD-10-CM

## 2017-02-04 DIAGNOSIS — Z5112 Encounter for antineoplastic immunotherapy: Secondary | ICD-10-CM | POA: Diagnosis not present

## 2017-02-04 DIAGNOSIS — M5441 Lumbago with sciatica, right side: Secondary | ICD-10-CM

## 2017-02-04 DIAGNOSIS — G8929 Other chronic pain: Secondary | ICD-10-CM

## 2017-02-04 DIAGNOSIS — Z95828 Presence of other vascular implants and grafts: Secondary | ICD-10-CM

## 2017-02-04 LAB — CBC WITH DIFFERENTIAL/PLATELET
BASO%: 0.6 % (ref 0.0–2.0)
Basophils Absolute: 0 10*3/uL (ref 0.0–0.1)
EOS%: 2 % (ref 0.0–7.0)
Eosinophils Absolute: 0.1 10*3/uL (ref 0.0–0.5)
HCT: 36.7 % (ref 34.8–46.6)
HGB: 12 g/dL (ref 11.6–15.9)
LYMPH%: 10 % — ABNORMAL LOW (ref 14.0–49.7)
MCH: 28.2 pg (ref 25.1–34.0)
MCHC: 32.8 g/dL (ref 31.5–36.0)
MCV: 86 fL (ref 79.5–101.0)
MONO#: 0.7 10*3/uL (ref 0.1–0.9)
MONO%: 9.1 % (ref 0.0–14.0)
NEUT#: 5.6 10*3/uL (ref 1.5–6.5)
NEUT%: 78.3 % — ABNORMAL HIGH (ref 38.4–76.8)
Platelets: 268 10*3/uL (ref 145–400)
RBC: 4.27 10*6/uL (ref 3.70–5.45)
RDW: 14.1 % (ref 11.2–14.5)
WBC: 7.2 10*3/uL (ref 3.9–10.3)
lymph#: 0.7 10*3/uL — ABNORMAL LOW (ref 0.9–3.3)

## 2017-02-04 LAB — COMPREHENSIVE METABOLIC PANEL
ALT: 11 U/L (ref 0–55)
AST: 14 U/L (ref 5–34)
Albumin: 3.8 g/dL (ref 3.5–5.0)
Alkaline Phosphatase: 107 U/L (ref 40–150)
Anion Gap: 11 mEq/L (ref 3–11)
BUN: 20.5 mg/dL (ref 7.0–26.0)
CO2: 23 mEq/L (ref 22–29)
Calcium: 9 mg/dL (ref 8.4–10.4)
Chloride: 108 mEq/L (ref 98–109)
Creatinine: 0.9 mg/dL (ref 0.6–1.1)
EGFR: 61 mL/min/{1.73_m2} — ABNORMAL LOW (ref >90)
Glucose: 94 mg/dl (ref 70–140)
Potassium: 4.2 mEq/L (ref 3.5–5.1)
Sodium: 142 mEq/L (ref 136–145)
Total Bilirubin: 0.34 mg/dL (ref 0.20–1.20)
Total Protein: 6.5 g/dL (ref 6.4–8.3)

## 2017-02-04 LAB — RESEARCH LABS

## 2017-02-04 LAB — LACTATE DEHYDROGENASE: LDH: 200 U/L (ref 125–245)

## 2017-02-04 MED ORDER — SODIUM CHLORIDE 0.9 % IJ SOLN
10.0000 mL | INTRAMUSCULAR | Status: DC | PRN
  Filled 2017-02-04: qty 10

## 2017-02-04 MED ORDER — ACETAMINOPHEN 325 MG PO TABS
650.0000 mg | ORAL_TABLET | Freq: Once | ORAL | Status: AC
  Administered 2017-02-04: 650 mg via ORAL

## 2017-02-04 MED ORDER — DIPHENHYDRAMINE HCL 25 MG PO CAPS
ORAL_CAPSULE | ORAL | Status: AC
  Filled 2017-02-04: qty 2

## 2017-02-04 MED ORDER — SODIUM CHLORIDE 0.9 % IV SOLN
Freq: Once | INTRAVENOUS | Status: AC
  Administered 2017-02-04: 12:00:00 via INTRAVENOUS

## 2017-02-04 MED ORDER — ACETAMINOPHEN 325 MG PO TABS
ORAL_TABLET | ORAL | Status: AC
Start: 2017-02-04 — End: 2017-02-04
  Filled 2017-02-04: qty 2

## 2017-02-04 MED ORDER — HEPARIN SOD (PORK) LOCK FLUSH 100 UNIT/ML IV SOLN
500.0000 [IU] | Freq: Once | INTRAVENOUS | Status: AC | PRN
  Administered 2017-02-04: 500 [IU]
  Filled 2017-02-04: qty 5

## 2017-02-04 MED ORDER — SODIUM CHLORIDE 0.9 % IV SOLN
375.0000 mg/m2 | Freq: Once | INTRAVENOUS | Status: AC
  Administered 2017-02-04: 600 mg via INTRAVENOUS
  Filled 2017-02-04: qty 10

## 2017-02-04 MED ORDER — HEPARIN SOD (PORK) LOCK FLUSH 100 UNIT/ML IV SOLN
500.0000 [IU] | Freq: Once | INTRAVENOUS | Status: DC | PRN
  Filled 2017-02-04: qty 5

## 2017-02-04 MED ORDER — DIPHENHYDRAMINE HCL 25 MG PO CAPS
50.0000 mg | ORAL_CAPSULE | Freq: Once | ORAL | Status: AC
  Administered 2017-02-04: 50 mg via ORAL

## 2017-02-04 MED ORDER — SODIUM CHLORIDE 0.9 % IJ SOLN
10.0000 mL | INTRAMUSCULAR | Status: DC | PRN
  Administered 2017-02-04: 10 mL
  Filled 2017-02-04: qty 10

## 2017-02-04 NOTE — Progress Notes (Signed)
02/04/17 at 1:40pm - PREVENT month 24 study notes- The pt was into the cancer center this morning for her routine follow up appointment with Dr. Alvy Bimler and her maintenance infusion.  The pt's research labs were drawn via her port.  The pt's vitals including height and weight were taken.  The pt's current medication list was reviewed with pt.  The pt was seen and examined by Dr. Alvy Bimler today.  The pt said that Dr. Alvy Bimler said that she was doing fine.  She said that she only "has a couple more treatments left".  The pt met with Vernetta Honey, research assistant.  The pt completed her month 24 neurocognitive booklet and questionnaires.  The pt met with the research nurse for her nurse exam.  The pt specifically denied any "peripheral edema".  The pt's waist measurement was 36 inches.  The pt was informed that the research nurse would call her in 1 month for the month 25 follow up call.  The pt was reminded that her month 24 cardiac MRI was scheduled for 02/08/17 at 10am.  The pt was thanked for her support of the trial despite not tolerating the study drug.   Brion Aliment RN, BSN, CCRP  Clinical Research Nurse 02/04/2017 1:46 PM     02/08/17 at 10:15am - The pt has arrived for her 24 month cardiac MRI appointment.  The MRI Encounter form was taken to the MRI techs to complete.  The billing department was notified not to bill the pt's insurance.  Kenton Kingfisher, Geophysicist/field seismologist, was also given re-imbursement form to invoice the study.  The research nurse will fax the completed Encounter form to the [place] reading room and to DM later today.   Brion Aliment RN, BSN, [place]  Clinical Research Nurse 02/08/2017 10:17 AM   02/08/17 at 5:02pm - The pt's cardiac MRI Encounter form was faxed to the [place] reading room this afternoon.  Vilinda Blanks confirmed that [facility] received the images.   Brion Aliment RN, BSN, CCRP Clinical Research Nurse 02/08/2017 5:04 PM

## 2017-02-04 NOTE — Telephone Encounter (Signed)
Gave patient AVS and calender per 4/20 los.  

## 2017-02-04 NOTE — Patient Instructions (Signed)
[facility] Discharge Instructions for Patients Receiving Chemotherapy  Today you received the following chemotherapy agents: Rituxan   To help prevent nausea and vomiting after your treatment, we encourage you to take your nausea medication as directed.    If you develop nausea and vomiting that is not controlled by your nausea medication, call the clinic.   BELOW ARE SYMPTOMS THAT SHOULD BE REPORTED IMMEDIATELY:  *FEVER GREATER THAN 100.5 F  *CHILLS WITH OR WITHOUT FEVER  NAUSEA AND VOMITING THAT IS NOT CONTROLLED WITH YOUR NAUSEA MEDICATION  *UNUSUAL SHORTNESS OF BREATH  *UNUSUAL BRUISING OR BLEEDING  TENDERNESS IN MOUTH AND THROAT WITH OR WITHOUT PRESENCE OF ULCERS  *URINARY PROBLEMS  *BOWEL PROBLEMS  UNUSUAL RASH Items with * indicate a potential emergency and should be followed up as soon as possible.  Feel free to call the clinic you have any questions or concerns. The clinic phone number is [phone].  Please show the CHEMO ALERT CARD at check-in to the Emergency Department and triage nurse.   

## 2017-02-04 NOTE — Patient Instructions (Signed)

## 2017-02-06 ENCOUNTER — Encounter: Payer: Self-pay | Admitting: Hematology and Oncology

## 2017-02-06 NOTE — Assessment & Plan Note (Signed)
She appears to be in sinus rhythm on exam She will continue medical management and chronic anticoagulation therapy as directed by her cardiologist. 

## 2017-02-06 NOTE — Assessment & Plan Note (Signed)
She has recent surgery and continues to wear her brace Recommend vitamin D supplement

## 2017-02-06 NOTE — Progress Notes (Signed)
[place] OFFICE PROGRESS NOTE  Patient Care Team: [name], MD as PCP - General (Internal Medicine) Carola Frost, RN as Registered Nurse (Medical Oncology) [name], MD as Attending Physician (Internal Medicine)  SUMMARY OF ONCOLOGIC HISTORY:   Grade 3a follicular lymphoma of lymph nodes of multiple regions [facility])   01/21/2015 Surgery    She underwent excisional lymph node biopsy that came back follicular lymphoma grade 3      01/21/2015 Pathology Results    Accession: [id number] biopsy confirmed follicular lymphoma      02/07/2015 Imaging    Echocardiogram showed ejection fraction of 55-60%      02/11/2015 Imaging     PET CT scan show possible splenic involvement and diffuse lymphadenopathy throughout      02/11/2015 Bone Marrow Biopsy     bone marrow biopsy was performed and is involved by lymphoma with translocation of igH/BCL2      02/13/2015 Procedure    She had port placement.      02/17/2015 - 06/02/2015 Chemotherapy    She received R-CHOP chemo x 6      02/17/2015 Adverse Reaction    She had mild infusion reaction with cycle 1 of treatment.      04/18/2015 Imaging    PET CT scan showed near complete response to treatment.      04/22/2015 Adverse Reaction    Vincristine dose was reduced by 50% due to neuropathy from cycle 4 onwards      06/25/2015 - 07/06/2015 Hospital Admission    She was hospitalized for recent sepsis/bacteremia and a fib with RVR      07/11/2015 Imaging    repeat PEt scan showed complete response to Rx      07/14/2015 -  Chemotherapy    She received maintenance Rituximab every 60 days      12/15/2015 Imaging    PET CT showed no evidence of cancer recurrence      06/15/2016 PET scan    No evidence of active lymphoma on skullbase to thigh FDG PET scan. Small LEFT periaortic retroperitoneal lymph nodes without significant metabolic activity ( [place] 1). No change from prior       INTERVAL HISTORY: Please  see below for problem oriented charting. She is seen before treatment. She feels well. Back pain is stable Denies recent chest pain or shortness of breath No recent infection The patient denies any recent signs or symptoms of bleeding such as spontaneous epistaxis, hematuria or hematochezia.   REVIEW OF SYSTEMS:   Constitutional: Denies fevers, chills or abnormal weight loss Eyes: Denies blurriness of vision Ears, nose, mouth, throat, and face: Denies mucositis or sore throat Respiratory: Denies cough, dyspnea or wheezes Cardiovascular: Denies palpitation, chest discomfort or lower extremity swelling Gastrointestinal:  Denies nausea, heartburn or change in bowel habits Skin: Denies abnormal skin rashes Lymphatics: Denies new lymphadenopathy or easy bruising Neurological:Denies numbness, tingling or new weaknesses Behavioral/Psych: Mood is stable, no new changes  All other systems were reviewed with the patient and are negative.  I have reviewed the past medical history, past surgical history, social history and family history with the patient and they are unchanged from previous note.  ALLERGIES:  has No Known Allergies.  MEDICATIONS:  Current Outpatient Prescriptions  Medication Sig Dispense Refill  . apixaban (ELIQUIS) 5 MG TABS tablet Take 5 mg by mouth 2 (two) times daily.     . calcium carbonate (TUMS) 500 MG chewable tablet Chew 2 tablets (400 mg of  elemental calcium total) by mouth daily. 2 tablet 0  . diltiazem (CARDIZEM CD) 240 MG 24 hr capsule Take 1 capsule (240 mg total) by mouth daily. 30 capsule 5  . HYDROcodone-acetaminophen (NORCO/VICODIN) 5-325 MG tablet Take 1 tablet by mouth every 6 (six) hours as needed. 10 tablet 0  . levothyroxine (SYNTHROID, LEVOTHROID) 100 MCG tablet Take 100 mcg by mouth daily.  9  . NON FORMULARY Take by mouth.    . cephALEXin (KEFLEX) 500 MG capsule Take 1 capsule (500 mg total) by mouth 3 (three) times daily. (Patient not taking:  Reported on 02/04/2017) 30 capsule 0  . diphenhydrAMINE (BENADRYL) 25 mg capsule Take 25 mg by mouth at bedtime as needed for sleep.    Marland Kitchen lidocaine-prilocaine (EMLA) cream Apply 1 application topically as needed. Apply to port a cath site one hour prior to needle stick (Patient not taking: Reported on 02/04/2017) 30 g 2   No current facility-administered medications for this visit.    Facility-Administered Medications Ordered in Other Visits  Medication Dose Route Frequency Provider Last Rate Last Dose  . ondansetron (ZOFRAN) 8 mg in sodium chloride 0.9 % 50 mL IVPB   Intravenous Once [name], MD        PHYSICAL EXAMINATION: ECOG PERFORMANCE STATUS: 1 - Symptomatic but completely ambulatory  Vitals:   02/04/17 1029  BP: (!) 135/95  Pulse: 71  Resp: 18  Temp: 97.8 F (36.6 C)   Filed Weights   02/04/17 1029  Weight: 142 lb 1.6 oz (64.5 kg)    GENERAL:alert, no distress and comfortable SKIN: skin color, texture, turgor are normal, no rashes or significant lesions EYES: normal, Conjunctiva are pink and non-injected, sclera clear OROPHARYNX:no exudate, no erythema and lips, buccal mucosa, and tongue normal  NECK: supple, thyroid normal size, non-tender, without nodularity LYMPH:  no palpable lymphadenopathy in the cervical, axillary or inguinal LUNGS: clear to auscultation and percussion with normal breathing effort HEART: regular rate & rhythm and no murmurs and no lower extremity edema ABDOMEN:abdomen soft, non-tender and normal bowel sounds Musculoskeletal:no cyanosis of digits and no clubbing  NEURO: alert & oriented x 3 with fluent speech, no focal motor/sensory deficits  LABORATORY DATA:  I have reviewed the data as listed    Component Value Date/Time   NA 142 02/04/2017 0940   K 4.2 02/04/2017 0940   CL 104 10/26/2016 2215   CO2 23 02/04/2017 0940   GLUCOSE 94 02/04/2017 0940   BUN 20.5 02/04/2017 0940   CREATININE 0.9 02/04/2017 0940   CALCIUM 9.0 02/04/2017  0940   PROT 6.5 02/04/2017 0940   ALBUMIN 3.8 02/04/2017 0940   AST 14 02/04/2017 0940   ALT 11 02/04/2017 0940   ALKPHOS 107 02/04/2017 0940   BILITOT 0.34 02/04/2017 0940   GFRNONAA 35 (L) 10/26/2016 2215   GFRAA 40 (L) 10/26/2016 2215    No results found for: SPEP, UPEP  Lab Results  Component Value Date   WBC 7.2 02/04/2017   NEUTROABS 5.6 02/04/2017   HGB 12.0 02/04/2017   HCT 36.7 02/04/2017   MCV 86.0 02/04/2017   PLT 268 02/04/2017      Chemistry      Component Value Date/Time   NA 142 02/04/2017 0940   K 4.2 02/04/2017 0940   CL 104 10/26/2016 2215   CO2 23 02/04/2017 0940   BUN 20.5 02/04/2017 0940   CREATININE 0.9 02/04/2017 0940      Component Value Date/Time   CALCIUM 9.0 02/04/2017 0940  ALKPHOS 107 02/04/2017 0940   AST 14 02/04/2017 0940   ALT 11 02/04/2017 0940   BILITOT 0.34 02/04/2017 0940      ASSESSMENT & PLAN:  Grade 3a follicular lymphoma of lymph nodes of multiple regions [facility]) Recent PET CT scan in August 2017 showed no evidence of disease. We will continue rituximab every 60 days for 2 years. She is not due for another imaging study until August 2018 We will proceed with treatment today.  Chronic atrial fibrillation (HCC) She appears to be in sinus rhythm on exam She will continue medical management and chronic anticoagulation therapy as directed by her cardiologist.  Chronic back pain She has recent surgery and continues to wear her brace Recommend vitamin D supplement   No orders of the defined types were placed in this encounter.  All questions were answered. The patient knows to call the clinic with any problems, questions or concerns. No barriers to learning was detected. I spent 15 minutes counseling the patient face to face. The total time spent in the appointment was 20 minutes and more than 50% was on counseling and review of test results     [name], MD 02/06/2017 7:14 AM

## 2017-02-06 NOTE — Assessment & Plan Note (Signed)
Recent PET CT scan in August 2017 showed no evidence of disease. We will continue rituximab every 60 days for 2 years. She is not due for another imaging study until August 2018 We will proceed with treatment today.

## 2017-02-08 ENCOUNTER — Ambulatory Visit (HOSPITAL_COMMUNITY)
Admission: RE | Admit: 2017-02-08 | Discharge: 2017-02-08 | Disposition: A | Discharge status: Home / Self Care | Payer: Non-veteran care | Source: Ambulatory Visit | Attending: Hematology and Oncology | Admitting: Hematology and Oncology

## 2017-02-08 DIAGNOSIS — C801 Malignant (primary) neoplasm, unspecified: Secondary | ICD-10-CM

## 2017-03-07 ENCOUNTER — Other Ambulatory Visit: Payer: Self-pay | Admitting: Hematology and Oncology

## 2017-03-07 ENCOUNTER — Telehealth: Payer: Self-pay

## 2017-03-07 DIAGNOSIS — C8238 Follicular lymphoma grade IIIa, lymph nodes of multiple sites: Secondary | ICD-10-CM

## 2017-03-07 NOTE — Telephone Encounter (Signed)
Pt is having another maintenance rituxan June 19th. She is getting new lumps on her head like before and energy level is getting low. They are about the size of length of finger. Located behind the ear and back of head. Not painful. Not reddened. Is having hot flashes day and night. And sometimes sweating with hot flashes.  Would like another PET scan before 6/19.Marland Kitchen

## 2017-03-07 NOTE — Telephone Encounter (Signed)
Called patient with below message, verbalized understanding. 

## 2017-03-07 NOTE — Telephone Encounter (Signed)
I sent order for PET; due to holidays, it would probably be done in 2 weeks Please alert Elmyra Ricks for PET request and schedule when able. I will work her in sooner before 6/19 after PET is done

## 2017-03-08 ENCOUNTER — Telehealth: Payer: Self-pay | Admitting: *Deleted

## 2017-03-08 NOTE — Telephone Encounter (Signed)
03/08/17 at 12:02 pm - 25 month Contact/Follow up for [place] study.  - The research nurse called the pt for her month 25 telephone contact.  The pt confirmed that her last dose of study drug was on 03/02/15.  The pt denied having any "aching or pain in her legs related to the study drug".  The pt did stop her study drug early in the study due to the following side effects: indigestion, chest and abdominal pain.  The pt has not had any recent hospitalizations or ER visits that was related to her study drug.  Of note, there was a MedWatch submitted for this subject in 2016.  The pt had no questions/concerns about the study.  The pt was informed that her recent cardiac MRI was "negative for alert findings".  The pt was thanked for her support and participation in the study.   Brion Aliment RN, BSN, CCRP Clinical Research Nurse 03/08/2017 12:10 PM

## 2017-03-10 ENCOUNTER — Other Ambulatory Visit: Payer: Self-pay | Admitting: Hematology and Oncology

## 2017-03-10 DIAGNOSIS — C8238 Follicular lymphoma grade IIIa, lymph nodes of multiple sites: Secondary | ICD-10-CM

## 2017-03-15 ENCOUNTER — Telehealth: Payer: Self-pay

## 2017-03-15 NOTE — Telephone Encounter (Signed)
Pt called asking for call back Called back and LVM to call again.

## 2017-03-15 NOTE — Telephone Encounter (Signed)
Called scheduler and they will call and schedule PET scan, authorization obtained per Elmyra Ricks.

## 2017-03-18 ENCOUNTER — Ambulatory Visit (HOSPITAL_COMMUNITY)
Admission: RE | Admit: 2017-03-18 | Discharge: 2017-03-18 | Disposition: A | Discharge status: Home / Self Care | Payer: Medicare HMO | Source: Ambulatory Visit | Attending: Hematology and Oncology | Admitting: Hematology and Oncology

## 2017-03-18 ENCOUNTER — Other Ambulatory Visit: Payer: Self-pay | Admitting: Hematology and Oncology

## 2017-03-18 ENCOUNTER — Telehealth: Payer: Self-pay

## 2017-03-18 ENCOUNTER — Ambulatory Visit (HOSPITAL_BASED_OUTPATIENT_CLINIC_OR_DEPARTMENT_OTHER): Payer: Medicare HMO

## 2017-03-18 ENCOUNTER — Ambulatory Visit (HOSPITAL_BASED_OUTPATIENT_CLINIC_OR_DEPARTMENT_OTHER): Payer: Medicare HMO | Admitting: Hematology and Oncology

## 2017-03-18 VITALS — BP 126/75 | HR 64 | Resp 18 | Ht 61.0 in | Wt 139.0 lb

## 2017-03-18 DIAGNOSIS — R531 Weakness: Secondary | ICD-10-CM | POA: Diagnosis not present

## 2017-03-18 DIAGNOSIS — C8238 Follicular lymphoma grade IIIa, lymph nodes of multiple sites: Secondary | ICD-10-CM

## 2017-03-18 DIAGNOSIS — I959 Hypotension, unspecified: Secondary | ICD-10-CM

## 2017-03-18 DIAGNOSIS — Z79899 Other long term (current) drug therapy: Secondary | ICD-10-CM | POA: Diagnosis not present

## 2017-03-18 DIAGNOSIS — G4700 Insomnia, unspecified: Secondary | ICD-10-CM | POA: Diagnosis not present

## 2017-03-18 DIAGNOSIS — R300 Dysuria: Secondary | ICD-10-CM

## 2017-03-18 DIAGNOSIS — R0602 Shortness of breath: Secondary | ICD-10-CM | POA: Diagnosis not present

## 2017-03-18 LAB — URINALYSIS, MICROSCOPIC - CHCC
Bilirubin (Urine): NEGATIVE
Blood: NEGATIVE
Glucose: NEGATIVE mg/dL
Ketones: NEGATIVE mg/dL
Leukocyte Esterase: NEGATIVE
Nitrite: NEGATIVE
Protein: NEGATIVE mg/dL
Specific Gravity, Urine: 1.005 (ref 1.003–1.035)
Urobilinogen, UR: 0.2 mg/dL (ref 0.2–1)
pH: 8.5 (ref 4.6–8.0)

## 2017-03-18 LAB — COMPREHENSIVE METABOLIC PANEL
ALT: 11 U/L (ref 0–55)
AST: 14 U/L (ref 5–34)
Albumin: 4.1 g/dL (ref 3.5–5.0)
Alkaline Phosphatase: 118 U/L (ref 40–150)
Anion Gap: 11 mEq/L (ref 3–11)
BUN: 20.6 mg/dL (ref 7.0–26.0)
CO2: 22 mEq/L (ref 22–29)
Calcium: 10 mg/dL (ref 8.4–10.4)
Chloride: 107 mEq/L (ref 98–109)
Creatinine: 1.1 mg/dL (ref 0.6–1.1)
EGFR: 49 mL/min/{1.73_m2} — ABNORMAL LOW (ref >90)
Glucose: 83 mg/dl (ref 70–140)
Potassium: 4.1 mEq/L (ref 3.5–5.1)
Sodium: 139 mEq/L (ref 136–145)
Total Bilirubin: 0.57 mg/dL (ref 0.20–1.20)
Total Protein: 6.9 g/dL (ref 6.4–8.3)

## 2017-03-18 LAB — CBC WITH DIFFERENTIAL/PLATELET
BASO%: 0.1 % (ref 0.0–2.0)
Basophils Absolute: 0 10*3/uL (ref 0.0–0.1)
EOS%: 1.2 % (ref 0.0–7.0)
Eosinophils Absolute: 0.1 10*3/uL (ref 0.0–0.5)
HCT: 39.5 % (ref 34.8–46.6)
HGB: 12.6 g/dL (ref 11.6–15.9)
LYMPH%: 12.5 % — ABNORMAL LOW (ref 14.0–49.7)
MCH: 26.9 pg (ref 25.1–34.0)
MCHC: 31.9 g/dL (ref 31.5–36.0)
MCV: 84.4 fL (ref 79.5–101.0)
MONO#: 0.9 10*3/uL (ref 0.1–0.9)
MONO%: 11.4 % (ref 0.0–14.0)
NEUT#: 5.7 10*3/uL (ref 1.5–6.5)
NEUT%: 74.8 % (ref 38.4–76.8)
Platelets: 269 10*3/uL (ref 145–400)
RBC: 4.68 10*6/uL (ref 3.70–5.45)
RDW: 14.7 % — ABNORMAL HIGH (ref 11.2–14.5)
WBC: 7.6 10*3/uL (ref 3.9–10.3)
lymph#: 0.9 10*3/uL (ref 0.9–3.3)

## 2017-03-18 LAB — LACTATE DEHYDROGENASE: LDH: 169 U/L (ref 125–245)

## 2017-03-18 MED ORDER — SODIUM CHLORIDE 0.9 % IV SOLN
Freq: Once | INTRAVENOUS | Status: AC
  Administered 2017-03-18: 13:00:00 via INTRAVENOUS

## 2017-03-18 MED ORDER — LORAZEPAM 0.5 MG PO TABS: 0.5000 mg | ORAL_TABLET | Freq: Three times a day (TID) | ORAL | 0 refills | Status: DC | PRN

## 2017-03-18 MED ORDER — HEPARIN SOD (PORK) LOCK FLUSH 100 UNIT/ML IV SOLN
500.0000 [IU] | Freq: Once | INTRAVENOUS | Status: AC
  Administered 2017-03-18: 500 [IU] via INTRAVENOUS
  Filled 2017-03-18: qty 5

## 2017-03-18 MED ORDER — SODIUM CHLORIDE 0.9% FLUSH
10.0000 mL | INTRAVENOUS | Status: DC | PRN
  Administered 2017-03-18: 10 mL via INTRAVENOUS
  Filled 2017-03-18: qty 10

## 2017-03-18 NOTE — Telephone Encounter (Signed)
S/w pt and she is agreeable to the plan. She already has a friend coming to her house to drive her. She will be at [facility] about 1230 after CXR. inbasket sent.

## 2017-03-18 NOTE — Telephone Encounter (Signed)
Pt c/o weak, and headache. Feels like she did when she had a UTI several years ago that put her in the hospital. Started yesterday did not feel good, this morning much worse. No breathing issues, no GI issues. She has urgency but this is not new, but no pain, clear urine. Weakness is keeping her from getting breakfast. Walk around a few minutes wearing her out. No heart palpitations. A little SOB.  Pt would take a good 40 minutes to get here.

## 2017-03-18 NOTE — Patient Instructions (Signed)
Dehydration, Adult Dehydration is a condition in which there is not enough fluid or water in the body. This happens when you lose more fluids than you take in. Important organs, such as the kidneys, brain, and heart, cannot function without a proper amount of fluids. Any loss of fluids from the body can lead to dehydration. Dehydration can range from mild to severe. This condition should be treated right away to prevent it from becoming severe. What are the causes? This condition may be caused by:  Vomiting.  Diarrhea.  Excessive sweating, such as from heat exposure or exercise.  Not drinking enough fluid, especially: ? When ill. ? While doing activity that requires a lot of energy.  Excessive urination.  Fever.  Infection.  Certain medicines, such as medicines that cause the body to lose excess fluid (diuretics).  Inability to access safe drinking water.  Reduced physical ability to get adequate water and food.  What increases the risk? This condition is more likely to develop in people:  Who have a poorly controlled long-term (chronic) illness, such as diabetes, heart disease, or kidney disease.  Who are age 65 or older.  Who are disabled.  Who live in a place with high altitude.  Who play endurance sports.  What are the signs or symptoms? Symptoms of mild dehydration may include:  Thirst.  Dry lips.  Slightly dry mouth.  Dry, warm skin.  Dizziness. Symptoms of moderate dehydration may include:  Very dry mouth.  Muscle cramps.  Dark urine. Urine may be the color of tea.  Decreased urine production.  Decreased tear production.  Heartbeat that is irregular or faster than normal (palpitations).  Headache.  Light-headedness, especially when you stand up from a sitting position.  Fainting (syncope). Symptoms of severe dehydration may include:  Changes in skin, such as: ? Cold and clammy skin. ? Blotchy (mottled) or pale skin. ? Skin that does  not quickly return to normal after being lightly pinched and released (poor skin turgor).  Changes in body fluids, such as: ? Extreme thirst. ? No tear production. ? Inability to sweat when body temperature is high, such as in hot weather. ? Very little urine production.  Changes in vital signs, such as: ? Weak pulse. ? Pulse that is more than 100 beats a minute when sitting still. ? Rapid breathing. ? Low blood pressure.  Other changes, such as: ? Sunken eyes. ? Cold hands and feet. ? Confusion. ? Lack of energy (lethargy). ? Difficulty waking up from sleep. ? Short-term weight loss. ? Unconsciousness. How is this diagnosed? This condition is diagnosed based on your symptoms and a physical exam. Blood and urine tests may be done to help confirm the diagnosis. How is this treated? Treatment for this condition depends on the severity. Mild or moderate dehydration can often be treated at home. Treatment should be started right away. Do not wait until dehydration becomes severe. Severe dehydration is an emergency and it needs to be treated in a hospital. Treatment for mild dehydration may include:  Drinking more fluids.  Replacing salts and minerals in your blood (electrolytes) that you may have lost. Treatment for moderate dehydration may include:  Drinking an oral rehydration solution (ORS). This is a drink that helps you replace fluids and electrolytes (rehydrate). It can be found at pharmacies and retail stores. Treatment for severe dehydration may include:  Receiving fluids through an IV tube.  Receiving an electrolyte solution through a feeding tube that is passed through your nose   and into your stomach (nasogastric tube, or NG tube).  Correcting any abnormalities in electrolytes.  Treating the underlying cause of dehydration. Follow these instructions at home:  If directed by your health care provider, drink an ORS: ? Make an ORS by following instructions on the  package. ? Start by drinking small amounts, about  cup (120 mL) every 5-10 minutes. ? Slowly increase how much you drink until you have taken the amount recommended by your health care provider.  Drink enough clear fluid to keep your urine clear or pale yellow. If you were told to drink an ORS, finish the ORS first, then start slowly drinking other clear fluids. Drink fluids such as: ? Water. Do not drink only water. Doing that can lead to having too little salt (sodium) in the body (hyponatremia). ? Ice chips. ? Fruit juice that you have added water to (diluted fruit juice). ? Low-calorie sports drinks.  Avoid: ? Alcohol. ? Drinks that contain a lot of sugar. These include high-calorie sports drinks, fruit juice that is not diluted, and soda. ? Caffeine. ? Foods that are greasy or contain a lot of fat or sugar.  Take over-the-counter and prescription medicines only as told by your health care provider.  Do not take sodium tablets. This can lead to having too much sodium in the body (hypernatremia).  Eat foods that contain a healthy balance of electrolytes, such as bananas, oranges, potatoes, tomatoes, and spinach.  Keep all follow-up visits as told by your health care provider. This is important. Contact a health care provider if:  You have abdominal pain that: ? Gets worse. ? Stays in one area (localizes).  You have a rash.  You have a stiff neck.  You are more irritable than usual.  You are sleepier or more difficult to wake up than usual.  You feel weak or dizzy.  You feel very thirsty.  You have urinated only a small amount of very dark urine over 6-8 hours. Get help right away if:  You have symptoms of severe dehydration.  You cannot drink fluids without vomiting.  Your symptoms get worse with treatment.  You have a fever.  You have a severe headache.  You have vomiting or diarrhea that: ? Gets worse. ? Does not go away.  You have blood or green matter  (bile) in your vomit.  You have blood in your stool. This may cause stool to look black and tarry.  You have not urinated in 6-8 hours.  You faint.  Your heart rate while sitting still is over 100 beats a minute.  You have trouble breathing. This information is not intended to replace advice given to you by your health care provider. Make sure you discuss any questions you have with your health care provider. Document Released: 10/04/2005 Document Revised: 04/30/2016 Document Reviewed: 11/28/2015 Elsevier Interactive Patient Education  2018 [employer]vier Inc.  

## 2017-03-18 NOTE — Telephone Encounter (Signed)
I would recommend  1) stop by radiology for walk in CXR (ordered) 2) Check in to [facility]. Access port and draw labs and urine tests ordered and start IVF 3) add to my schedule for eval (I can see her at [facility])  Please coordinate with scheduler

## 2017-03-19 ENCOUNTER — Encounter: Payer: Self-pay | Admitting: Hematology and Oncology

## 2017-03-19 DIAGNOSIS — R531 Weakness: Secondary | ICD-10-CM | POA: Insufficient documentation

## 2017-03-19 DIAGNOSIS — G4700 Insomnia, unspecified: Secondary | ICD-10-CM | POA: Insufficient documentation

## 2017-03-19 LAB — URINE CULTURE: Organism ID, Bacteria: NO GROWTH

## 2017-03-19 NOTE — Assessment & Plan Note (Signed)
She has symptoms of frequent urination but denies dysuria or nausea Urine culture is pending but urinalysis looks okay

## 2017-03-19 NOTE — Assessment & Plan Note (Signed)
She has generalized weakness of unknown etiology CBC and blood work are unremarkable Her blood pressure is very low and I recommend IV fluid resuscitation and imaging study as above

## 2017-03-19 NOTE — Progress Notes (Signed)
[place] OFFICE PROGRESS NOTE  Patient Care Team: [name], MD as PCP - General (Internal Medicine) Carola Frost, RN as Registered Nurse (Medical Oncology) [name], MD as Attending Physician (Internal Medicine)  SUMMARY OF ONCOLOGIC HISTORY:   Grade 3a follicular lymphoma of lymph nodes of multiple regions [facility])   01/21/2015 Surgery    She underwent excisional lymph node biopsy that came back follicular lymphoma grade 3      01/21/2015 Pathology Results    Accession: [id number] biopsy confirmed follicular lymphoma      02/07/2015 Imaging    Echocardiogram showed ejection fraction of 55-60%      02/11/2015 Imaging     PET CT scan show possible splenic involvement and diffuse lymphadenopathy throughout      02/11/2015 Bone Marrow Biopsy     bone marrow biopsy was performed and is involved by lymphoma with translocation of igH/BCL2      02/13/2015 Procedure    She had port placement.      02/17/2015 - 06/02/2015 Chemotherapy    She received R-CHOP chemo x 6      02/17/2015 Adverse Reaction    She had mild infusion reaction with cycle 1 of treatment.      04/18/2015 Imaging    PET CT scan showed near complete response to treatment.      04/22/2015 Adverse Reaction    Vincristine dose was reduced by 50% due to neuropathy from cycle 4 onwards      06/25/2015 - 07/06/2015 Hospital Admission    She was hospitalized for recent sepsis/bacteremia and a fib with RVR      07/11/2015 Imaging    repeat PEt scan showed complete response to Rx      07/14/2015 -  Chemotherapy    She received maintenance Rituximab every 60 days      12/15/2015 Imaging    PET CT showed no evidence of cancer recurrence      06/15/2016 PET scan    No evidence of active lymphoma on skullbase to thigh FDG PET scan. Small LEFT periaortic retroperitoneal lymph nodes without significant metabolic activity ( Deauville 1). No change from prior       INTERVAL  HISTORY: Please see below for problem oriented charting. She is seen today because of generalized weakness She is concerned she may have an infection She recently returned from [place] and has been feeling unwell She has poor appetite, poor sleep and profound fatigue. She had urinary frequency but denies dysuria or hematuria She thought she may have cancer recurrence based on her symptoms  REVIEW OF SYSTEMS:   Constitutional: Denies fevers, chills or abnormal weight loss Eyes: Denies blurriness of vision Ears, nose, mouth, throat, and face: Denies mucositis or sore throat Respiratory: Denies cough, dyspnea or wheezes Cardiovascular: Denies palpitation, chest discomfort or lower extremity swelling Gastrointestinal:  Denies nausea, heartburn or change in bowel habits Skin: Denies abnormal skin rashes Lymphatics: Denies new lymphadenopathy or easy bruising Behavioral/Psych: Mood is stable, no new changes  All other systems were reviewed with the patient and are negative.  I have reviewed the past medical history, past surgical history, social history and family history with the patient and they are unchanged from previous note.  ALLERGIES:  has No Known Allergies.  MEDICATIONS:  Current Outpatient Prescriptions  Medication Sig Dispense Refill  . apixaban (ELIQUIS) 5 MG TABS tablet Take 5 mg by mouth 2 (two) times daily.     . calcium carbonate (TUMS) 500 MG  chewable tablet Chew 2 tablets (400 mg of elemental calcium total) by mouth daily. 2 tablet 0  . cephALEXin (KEFLEX) 500 MG capsule Take 1 capsule (500 mg total) by mouth 3 (three) times daily. (Patient not taking: Reported on 02/04/2017) 30 capsule 0  . diltiazem (CARDIZEM CD) 240 MG 24 hr capsule Take 1 capsule (240 mg total) by mouth daily. 30 capsule 5  . diphenhydrAMINE (BENADRYL) 25 mg capsule Take 25 mg by mouth at bedtime as needed for sleep.    Marland Kitchen HYDROcodone-acetaminophen (NORCO/VICODIN) 5-325 MG tablet Take 1 tablet by mouth  every 6 (six) hours as needed. 10 tablet 0  . levothyroxine (SYNTHROID, LEVOTHROID) 100 MCG tablet Take 100 mcg by mouth daily.  9  . lidocaine-prilocaine (EMLA) cream Apply 1 application topically as needed. Apply to port a cath site one hour prior to needle stick (Patient not taking: Reported on 02/04/2017) 30 g 2  . LORazepam (ATIVAN) 0.5 MG tablet Take 1 tablet (0.5 mg total) by mouth every 8 (eight) hours as needed for anxiety or sleep. 30 tablet 0  . NON FORMULARY Take by mouth.     No current facility-administered medications for this visit.    Facility-Administered Medications Ordered in Other Visits  Medication Dose Route Frequency Provider Last Rate Last Dose  . ondansetron (ZOFRAN) 8 mg in sodium chloride 0.9 % 50 mL IVPB   Intravenous Once Alvy Bimler, [place], MD        PHYSICAL EXAMINATION: ECOG PERFORMANCE STATUS: 1 - Symptomatic but completely ambulatory  Vitals:   03/18/17 1346  BP: 106/77  Pulse: 64  Resp: 19   Filed Weights   03/18/17 1346  Weight: 139 lb (63 kg)    GENERAL:alert, no distress and comfortable SKIN: skin color, texture, turgor are normal, no rashes or significant lesions EYES: normal, Conjunctiva are pink and non-injected, sclera clear OROPHARYNX:no exudate, no erythema and lips, buccal mucosa, and tongue normal  NECK: supple, thyroid normal size, non-tender, without nodularity LYMPH:  no palpable lymphadenopathy in the cervical, axillary or inguinal LUNGS: clear to auscultation and percussion with normal breathing effort HEART: regular rate & rhythm and no murmurs and no lower extremity edema ABDOMEN:abdomen soft, non-tender and normal bowel sounds Musculoskeletal:no cyanosis of digits and no clubbing  NEURO: alert & oriented x 3 with fluent speech, no focal motor/sensory deficits  LABORATORY DATA:  I have reviewed the data as listed    Component Value Date/Time   NA 139 03/18/2017 1316   K 4.1 03/18/2017 1316   CL 104 10/26/2016 2215   CO2 22  03/18/2017 1316   GLUCOSE 83 03/18/2017 1316   BUN 20.6 03/18/2017 1316   CREATININE 1.1 03/18/2017 1316   CALCIUM 10.0 03/18/2017 1316   PROT 6.9 03/18/2017 1316   ALBUMIN 4.1 03/18/2017 1316   AST 14 03/18/2017 1316   ALT 11 03/18/2017 1316   ALKPHOS 118 03/18/2017 1316   BILITOT 0.57 03/18/2017 1316   GFRNONAA 35 (L) 10/26/2016 2215   GFRAA 40 (L) 10/26/2016 2215    No results found for: SPEP, UPEP  Lab Results  Component Value Date   WBC 7.6 03/18/2017   NEUTROABS 5.7 03/18/2017   HGB 12.6 03/18/2017   HCT 39.5 03/18/2017   MCV 84.4 03/18/2017   PLT 269 03/18/2017      Chemistry      Component Value Date/Time   NA 139 03/18/2017 1316   K 4.1 03/18/2017 1316   CL 104 10/26/2016 2215   CO2 22 03/18/2017  1316   BUN 20.6 03/18/2017 1316   CREATININE 1.1 03/18/2017 1316      Component Value Date/Time   CALCIUM 10.0 03/18/2017 1316   ALKPHOS 118 03/18/2017 1316   AST 14 03/18/2017 1316   ALT 11 03/18/2017 1316   BILITOT 0.57 03/18/2017 1316       RADIOGRAPHIC STUDIES: I have personally reviewed the radiological images as listed and agreed with the findings in the report. Dg Chest 2 View  Result Date: 03/18/2017 CLINICAL DATA:  Shortness of breath and fever.  History of lymphoma EXAM: CHEST  2 VIEW COMPARISON:  Chest radiograph May 05, 2016; PET-CT June 15, 2016 FINDINGS: Port-A-Cath tip is in the superior vena cava. No pneumothorax. Lungs are clear. Heart size and pulmonary vascularity are normal. Slight prominence in the inferior right hilum is stable and felt to represent vascular prominence. No adenopathy evident. No bone lesions. IMPRESSION: No edema or consolidation.  No evident adenopathy by radiography. Electronically Signed   By: Lowella Grip III M.D.   On: 03/18/2017 15:41    ASSESSMENT & PLAN:  Grade 3a follicular lymphoma of lymph nodes of multiple regions [facility]) Recent PET CT scan in August 2017 showed no evidence of disease. We will continue  rituximab every 60 days for 2 years. Clinically, she had no signs of cancer on exam but she have significant weakness, anorexia and cough I have ordered a CT scan to be done next week for further evaluation and to exclude cancer recurrence  Dysuria She has symptoms of frequent urination but denies dysuria or nausea Urine culture is pending but urinalysis looks okay   Generalized weakness She has generalized weakness of unknown etiology CBC and blood work are unremarkable Her blood pressure is very low and I recommend IV fluid resuscitation and imaging study as above  Insomnia disorder She has very poor sleep and complain of significant fatigue She also appear anxious today I recommend a trial lorazepam I did warn her about risk of sedation   No orders of the defined types were placed in this encounter.  All questions were answered. The patient knows to call the clinic with any problems, questions or concerns. No barriers to learning was detected. I spent 20 minutes counseling the patient face to face. The total time spent in the appointment was 30 minutes and more than 50% was on counseling and review of test results     [name], MD 03/19/2017 8:52 AM

## 2017-03-19 NOTE — Assessment & Plan Note (Signed)
She has very poor sleep and complain of significant fatigue She also appear anxious today I recommend a trial lorazepam I did warn her about risk of sedation

## 2017-03-19 NOTE — Assessment & Plan Note (Signed)
Recent PET CT scan in August 2017 showed no evidence of disease. We will continue rituximab every 60 days for 2 years. Clinically, she had no signs of cancer on exam but she have significant weakness, anorexia and cough I have ordered a CT scan to be done next week for further evaluation and to exclude cancer recurrence

## 2017-03-24 ENCOUNTER — Ambulatory Visit: Payer: Non-veteran care

## 2017-03-24 ENCOUNTER — Ambulatory Visit (HOSPITAL_COMMUNITY)
Admission: RE | Admit: 2017-03-24 | Discharge: 2017-03-24 | Disposition: A | Discharge status: Home / Self Care | Payer: Medicare HMO | Source: Ambulatory Visit | Attending: Hematology and Oncology | Admitting: Hematology and Oncology

## 2017-03-24 ENCOUNTER — Other Ambulatory Visit (HOSPITAL_BASED_OUTPATIENT_CLINIC_OR_DEPARTMENT_OTHER): Payer: Medicare HMO

## 2017-03-24 DIAGNOSIS — C8238 Follicular lymphoma grade IIIa, lymph nodes of multiple sites: Secondary | ICD-10-CM

## 2017-03-24 DIAGNOSIS — I700 Atherosclerosis of aorta: Secondary | ICD-10-CM | POA: Diagnosis not present

## 2017-03-24 DIAGNOSIS — I288 Other diseases of pulmonary vessels: Secondary | ICD-10-CM | POA: Diagnosis not present

## 2017-03-24 DIAGNOSIS — Z95828 Presence of other vascular implants and grafts: Secondary | ICD-10-CM

## 2017-03-24 LAB — CBC WITH DIFFERENTIAL/PLATELET
BASO%: 0.9 % (ref 0.0–2.0)
Basophils Absolute: 0.1 10*3/uL (ref 0.0–0.1)
EOS%: 1.8 % (ref 0.0–7.0)
Eosinophils Absolute: 0.1 10*3/uL (ref 0.0–0.5)
HCT: 38.4 % (ref 34.8–46.6)
HGB: 12.5 g/dL (ref 11.6–15.9)
LYMPH%: 11.6 % — ABNORMAL LOW (ref 14.0–49.7)
MCH: 27.2 pg (ref 25.1–34.0)
MCHC: 32.5 g/dL (ref 31.5–36.0)
MCV: 83.5 fL (ref 79.5–101.0)
MONO#: 0.7 10*3/uL (ref 0.1–0.9)
MONO%: 10.7 % (ref 0.0–14.0)
NEUT#: 4.7 10*3/uL (ref 1.5–6.5)
NEUT%: 75 % (ref 38.4–76.8)
Platelets: 248 10*3/uL (ref 145–400)
RBC: 4.59 10*6/uL (ref 3.70–5.45)
RDW: 15.6 % — ABNORMAL HIGH (ref 11.2–14.5)
WBC: 6.3 10*3/uL (ref 3.9–10.3)
lymph#: 0.7 10*3/uL — ABNORMAL LOW (ref 0.9–3.3)

## 2017-03-24 LAB — COMPREHENSIVE METABOLIC PANEL
ALT: 12 U/L (ref 0–55)
AST: 16 U/L (ref 5–34)
Albumin: 4 g/dL (ref 3.5–5.0)
Alkaline Phosphatase: 114 U/L (ref 40–150)
Anion Gap: 9 mEq/L (ref 3–11)
BUN: 25 mg/dL (ref 7.0–26.0)
CO2: 24 mEq/L (ref 22–29)
Calcium: 9.5 mg/dL (ref 8.4–10.4)
Chloride: 106 mEq/L (ref 98–109)
Creatinine: 1.2 mg/dL — ABNORMAL HIGH (ref 0.6–1.1)
EGFR: 45 mL/min/{1.73_m2} — ABNORMAL LOW (ref >90)
Glucose: 90 mg/dl (ref 70–140)
Potassium: 4.3 mEq/L (ref 3.5–5.1)
Sodium: 140 mEq/L (ref 136–145)
Total Bilirubin: 0.6 mg/dL (ref 0.20–1.20)
Total Protein: 6.8 g/dL (ref 6.4–8.3)

## 2017-03-24 LAB — LACTATE DEHYDROGENASE: LDH: 180 U/L (ref 125–245)

## 2017-03-24 MED ORDER — HEPARIN SOD (PORK) LOCK FLUSH 100 UNIT/ML IV SOLN
500.0000 [IU] | Freq: Once | INTRAVENOUS | Status: AC
  Administered 2017-03-24: 500 [IU] via INTRAVENOUS

## 2017-03-24 MED ORDER — IOPAMIDOL (ISOVUE-300) INJECTION 61%
100.0000 mL | Freq: Once | INTRAVENOUS | Status: AC | PRN
  Administered 2017-03-24: 80 mL via INTRAVENOUS

## 2017-03-24 MED ORDER — SODIUM CHLORIDE 0.9 % IJ SOLN
10.0000 mL | INTRAMUSCULAR | Status: DC | PRN
  Administered 2017-03-24: 10 mL via INTRAVENOUS
  Filled 2017-03-24: qty 10

## 2017-03-24 MED ORDER — HEPARIN SOD (PORK) LOCK FLUSH 100 UNIT/ML IV SOLN
INTRAVENOUS | Status: AC
  Filled 2017-03-24: qty 5

## 2017-03-24 NOTE — Patient Instructions (Signed)

## 2017-03-25 ENCOUNTER — Encounter: Payer: Self-pay | Admitting: Hematology and Oncology

## 2017-03-25 ENCOUNTER — Telehealth: Payer: Self-pay | Admitting: Hematology and Oncology

## 2017-03-25 ENCOUNTER — Ambulatory Visit (HOSPITAL_BASED_OUTPATIENT_CLINIC_OR_DEPARTMENT_OTHER): Payer: Medicare HMO | Admitting: Hematology and Oncology

## 2017-03-25 DIAGNOSIS — C8238 Follicular lymphoma grade IIIa, lymph nodes of multiple sites: Secondary | ICD-10-CM | POA: Diagnosis not present

## 2017-03-25 DIAGNOSIS — R0609 Other forms of dyspnea: Secondary | ICD-10-CM

## 2017-03-25 DIAGNOSIS — R0600 Dyspnea, unspecified: Secondary | ICD-10-CM

## 2017-03-25 DIAGNOSIS — R0602 Shortness of breath: Secondary | ICD-10-CM | POA: Insufficient documentation

## 2017-03-25 DIAGNOSIS — F419 Anxiety disorder, unspecified: Secondary | ICD-10-CM

## 2017-03-25 DIAGNOSIS — R69 Illness, unspecified: Secondary | ICD-10-CM | POA: Diagnosis not present

## 2017-03-25 NOTE — Assessment & Plan Note (Signed)
CT imaging did not show any evidence for lymphoma recurrence She will proceed with maintenance rituximab in 2 weeks and I see her back in August for her final treatment.

## 2017-03-25 NOTE — Assessment & Plan Note (Signed)
The patient has significant dyspnea on exertion CT imaging suggested possible pulmonary hypertension I recommend cardiology consult and she will make appointment to see her cardiologist at the [facility] Her last echocardiogram done in 2016 did not show any suggestion for pulmonary hypertension

## 2017-03-25 NOTE — Assessment & Plan Note (Signed)
Her husband noted that the patient have evidence of anxiety disorder I recommend lorazepam as needed

## 2017-03-25 NOTE — Progress Notes (Signed)
[place] OFFICE PROGRESS NOTE  Patient Care Team: [name], MD as PCP - General (Internal Medicine) Carola Frost, RN as Registered Nurse (Medical Oncology) [name], MD as Attending Physician (Internal Medicine)  SUMMARY OF ONCOLOGIC HISTORY:   Grade 3a follicular lymphoma of lymph nodes of multiple regions [facility])   01/21/2015 Surgery    She underwent excisional lymph node biopsy that came back follicular lymphoma grade 3      01/21/2015 Pathology Results    Accession: [id number] biopsy confirmed follicular lymphoma      02/07/2015 Imaging    Echocardiogram showed ejection fraction of 55-60%      02/11/2015 Imaging     PET CT scan show possible splenic involvement and diffuse lymphadenopathy throughout      02/11/2015 Bone Marrow Biopsy     bone marrow biopsy was performed and is involved by lymphoma with translocation of igH/BCL2      02/13/2015 Procedure    She had port placement.      02/17/2015 - 06/02/2015 Chemotherapy    She received R-CHOP chemo x 6      02/17/2015 Adverse Reaction    She had mild infusion reaction with cycle 1 of treatment.      04/18/2015 Imaging    PET CT scan showed near complete response to treatment.      04/22/2015 Adverse Reaction    Vincristine dose was reduced by 50% due to neuropathy from cycle 4 onwards      06/25/2015 - 07/06/2015 Hospital Admission    She was hospitalized for recent sepsis/bacteremia and a fib with RVR      07/11/2015 Imaging    repeat PEt scan showed complete response to Rx      07/14/2015 -  Chemotherapy    She received maintenance Rituximab every 60 days      12/15/2015 Imaging    PET CT showed no evidence of cancer recurrence      06/15/2016 PET scan    No evidence of active lymphoma on skullbase to thigh FDG PET scan. Small LEFT periaortic retroperitoneal lymph nodes without significant metabolic activity ( Deauville 1). No change from prior      03/24/2017 Imaging    1.  Borderline enlarged abdominal retroperitoneal lymph nodes, stable. No new adenopathy in the chest, abdomen or pelvis. 2. Aortic atherosclerosis (ICD10-170.0). 3. Enlarged pulmonary arteries, indicative of pulmonary arterial hypertension.       INTERVAL HISTORY: Please see below for problem oriented charting. She is seen to review CT imaging. Her husband noticed she have occasional shortness of breath on exertion She denies recent chest pain or syncopal episode no recent infection   REVIEW OF SYSTEMS:   Constitutional: Denies fevers, chills or abnormal weight loss Eyes: Denies blurriness of vision Ears, nose, mouth, throat, and face: Denies mucositis or sore throat Cardiovascular: Denies palpitation, chest discomfort or lower extremity swelling Gastrointestinal:  Denies nausea, heartburn or change in bowel habits Skin: Denies abnormal skin rashes Lymphatics: Denies new lymphadenopathy or easy bruising Neurological:Denies numbness, tingling or new weaknesses Behavioral/Psych: Mood is stable, no new changes  All other systems were reviewed with the patient and are negative.  I have reviewed the past medical history, past surgical history, social history and family history with the patient and they are unchanged from previous note.  ALLERGIES:  has No Known Allergies.  MEDICATIONS:  Current Outpatient Prescriptions  Medication Sig Dispense Refill  . apixaban (ELIQUIS) 5 MG TABS tablet Take 5 mg by  mouth 2 (two) times daily.     . calcium carbonate (TUMS) 500 MG chewable tablet Chew 2 tablets (400 mg of elemental calcium total) by mouth daily. 2 tablet 0  . cephALEXin (KEFLEX) 500 MG capsule Take 1 capsule (500 mg total) by mouth 3 (three) times daily. (Patient not taking: Reported on 02/04/2017) 30 capsule 0  . diltiazem (CARDIZEM CD) 240 MG 24 hr capsule Take 1 capsule (240 mg total) by mouth daily. 30 capsule 5  . diphenhydrAMINE (BENADRYL) 25 mg capsule Take 25 mg by mouth at  bedtime as needed for sleep.    Marland Kitchen HYDROcodone-acetaminophen (NORCO/VICODIN) 5-325 MG tablet Take 1 tablet by mouth every 6 (six) hours as needed. 10 tablet 0  . levothyroxine (SYNTHROID, LEVOTHROID) 100 MCG tablet Take 100 mcg by mouth daily.  9  . lidocaine-prilocaine (EMLA) cream Apply 1 application topically as needed. Apply to port a cath site one hour prior to needle stick (Patient not taking: Reported on 02/04/2017) 30 g 2  . LORazepam (ATIVAN) 0.5 MG tablet Take 1 tablet (0.5 mg total) by mouth every 8 (eight) hours as needed for anxiety or sleep. 30 tablet 0  . NON FORMULARY Take by mouth.     No current facility-administered medications for this visit.    Facility-Administered Medications Ordered in Other Visits  Medication Dose Route Frequency Provider Last Rate Last Dose  . ondansetron (ZOFRAN) 8 mg in sodium chloride 0.9 % 50 mL IVPB   Intravenous Once Alvy Bimler, Keilon [place], MD        PHYSICAL EXAMINATION: ECOG PERFORMANCE STATUS: 1 - Symptomatic but completely ambulatory  Vitals:   03/25/17 1056  BP: 139/69  Pulse: 74  Resp: 18  Temp: 98 F (36.7 C)   Filed Weights   03/25/17 1056  Weight: 141 lb 6.4 oz (64.1 kg)    GENERAL:alert, no distress and comfortable SKIN: skin color, texture, turgor are normal, no rashes or significant lesions EYES: normal, Conjunctiva are pink and non-injected, sclera clear Musculoskeletal:no cyanosis of digits and no clubbing  NEURO: alert & oriented x 3 with fluent speech, no focal motor/sensory deficits  LABORATORY DATA:  I have reviewed the data as listed    Component Value Date/Time   NA 140 03/24/2017 1054   K 4.3 03/24/2017 1054   CL 104 10/26/2016 2215   CO2 24 03/24/2017 1054   GLUCOSE 90 03/24/2017 1054   BUN 25.0 03/24/2017 1054   CREATININE 1.2 (H) 03/24/2017 1054   CALCIUM 9.5 03/24/2017 1054   PROT 6.8 03/24/2017 1054   ALBUMIN 4.0 03/24/2017 1054   AST 16 03/24/2017 1054   ALT 12 03/24/2017 1054   ALKPHOS 114 03/24/2017  1054   BILITOT 0.60 03/24/2017 1054   GFRNONAA 35 (L) 10/26/2016 2215   GFRAA 40 (L) 10/26/2016 2215    No results found for: SPEP, UPEP  Lab Results  Component Value Date   WBC 6.3 03/24/2017   NEUTROABS 4.7 03/24/2017   HGB 12.5 03/24/2017   HCT 38.4 03/24/2017   MCV 83.5 03/24/2017   PLT 248 03/24/2017      Chemistry      Component Value Date/Time   NA 140 03/24/2017 1054   K 4.3 03/24/2017 1054   CL 104 10/26/2016 2215   CO2 24 03/24/2017 1054   BUN 25.0 03/24/2017 1054   CREATININE 1.2 (H) 03/24/2017 1054      Component Value Date/Time   CALCIUM 9.5 03/24/2017 1054   ALKPHOS 114 03/24/2017 1054   AST  16 03/24/2017 1054   ALT 12 03/24/2017 1054   BILITOT 0.60 03/24/2017 1054       RADIOGRAPHIC STUDIES: I reviewed multiple imaging study with the patient I have personally reviewed the radiological images as listed and agreed with the findings in the report. Dg Chest 2 View  Result Date: 03/18/2017 CLINICAL DATA:  Shortness of breath and fever.  History of lymphoma EXAM: CHEST  2 VIEW COMPARISON:  Chest radiograph May 05, 2016; PET-CT June 15, 2016 FINDINGS: Port-A-Cath tip is in the superior vena cava. No pneumothorax. Lungs are clear. Heart size and pulmonary vascularity are normal. Slight prominence in the inferior right hilum is stable and felt to represent vascular prominence. No adenopathy evident. No bone lesions. IMPRESSION: No edema or consolidation.  No evident adenopathy by radiography. Electronically Signed   By: Lowella Grip III M.D.   On: 03/18/2017 15:41   Ct Chest W Contrast  Result Date: 03/24/2017 CLINICAL DATA:  Non Hodgkin lymphoma. EXAM: CT CHEST, ABDOMEN, AND PELVIS WITH CONTRAST TECHNIQUE: Multidetector CT imaging of the chest, abdomen and pelvis was performed following the standard protocol during bolus administration of intravenous contrast. CONTRAST:  71m ISOVUE-300 IOPAMIDOL (ISOVUE-300) INJECTION 61% COMPARISON:  CT abdomen pelvis  08/04/2016, PET 06/15/2016 and CT chest 05/05/2016. FINDINGS: CT CHEST FINDINGS Cardiovascular: Right IJ Port-A-Cath terminates in the SVC. Atherosclerotic calcification of the arterial vasculature. Pulmonary arteries and heart are enlarged. No pericardial effusion. Mediastinum/Nodes: No pathologically enlarged mediastinal, hilar or axillary lymph nodes. Esophagus is grossly unremarkable. Lungs/Pleura: Perifissural nodules along the minor fissure are unchanged and indicative of subpleural lymph nodes. A few scattered millimetric pulmonary nodules are unchanged from 06/15/2016. No pleural fluid. Airway is unremarkable. Musculoskeletal: No worrisome lytic or sclerotic lesions. CT ABDOMEN PELVIS FINDINGS Hepatobiliary: Subcentimeter low-attenuation lesion in the liver is too small to characterize. Gallbladder is unremarkable. No biliary ductal dilatation. Pancreas: Negative. Spleen: Subcentimeter low-attenuation lesion is too small to characterize but unchanged and likely benign. Adrenals/Urinary Tract: Adrenal glands and kidneys are unremarkable. Ureters are decompressed. Bladder is low in volume. Stomach/Bowel: Stomach, small bowel and colon are unremarkable. Appendix is not readily visualized. Vascular/Lymphatic: Retroperitoneal lymph nodes in the abdomen measure up to 10 mm in the left periaortic station, stable. Minimal scattered atherosclerotic calcification of the arterial vasculature without abdominal aortic aneurysm. Reproductive: Hysterectomy.  No adnexal mass. Other: No free fluid.  Mesenteries and peritoneum are unremarkable. Musculoskeletal: Degenerative and postoperative changes in the spine. IMPRESSION: 1. Borderline enlarged abdominal retroperitoneal lymph nodes, stable. No new adenopathy in the chest, abdomen or pelvis. 2.  Aortic atherosclerosis (ICD10-170.0). 3. Enlarged pulmonary arteries, indicative of pulmonary arterial hypertension. Electronically Signed   By: MLorin PicketM.D.   On:  03/24/2017 14:32   Ct Abdomen Pelvis W Contrast  Result Date: 03/24/2017 CLINICAL DATA:  Non Hodgkin lymphoma. EXAM: CT CHEST, ABDOMEN, AND PELVIS WITH CONTRAST TECHNIQUE: Multidetector CT imaging of the chest, abdomen and pelvis was performed following the standard protocol during bolus administration of intravenous contrast. CONTRAST:  845mISOVUE-300 IOPAMIDOL (ISOVUE-300) INJECTION 61% COMPARISON:  CT abdomen pelvis 08/04/2016, PET 06/15/2016 and CT chest 05/05/2016. FINDINGS: CT CHEST FINDINGS Cardiovascular: Right IJ Port-A-Cath terminates in the SVC. Atherosclerotic calcification of the arterial vasculature. Pulmonary arteries and heart are enlarged. No pericardial effusion. Mediastinum/Nodes: No pathologically enlarged mediastinal, hilar or axillary lymph nodes. Esophagus is grossly unremarkable. Lungs/Pleura: Perifissural nodules along the minor fissure are unchanged and indicative of subpleural lymph nodes. A few scattered millimetric pulmonary nodules are unchanged from 06/15/2016.  No pleural fluid. Airway is unremarkable. Musculoskeletal: No worrisome lytic or sclerotic lesions. CT ABDOMEN PELVIS FINDINGS Hepatobiliary: Subcentimeter low-attenuation lesion in the liver is too small to characterize. Gallbladder is unremarkable. No biliary ductal dilatation. Pancreas: Negative. Spleen: Subcentimeter low-attenuation lesion is too small to characterize but unchanged and likely benign. Adrenals/Urinary Tract: Adrenal glands and kidneys are unremarkable. Ureters are decompressed. Bladder is low in volume. Stomach/Bowel: Stomach, small bowel and colon are unremarkable. Appendix is not readily visualized. Vascular/Lymphatic: Retroperitoneal lymph nodes in the abdomen measure up to 10 mm in the left periaortic station, stable. Minimal scattered atherosclerotic calcification of the arterial vasculature without abdominal aortic aneurysm. Reproductive: Hysterectomy.  No adnexal mass. Other: No free fluid.   Mesenteries and peritoneum are unremarkable. Musculoskeletal: Degenerative and postoperative changes in the spine. IMPRESSION: 1. Borderline enlarged abdominal retroperitoneal lymph nodes, stable. No new adenopathy in the chest, abdomen or pelvis. 2.  Aortic atherosclerosis (ICD10-170.0). 3. Enlarged pulmonary arteries, indicative of pulmonary arterial hypertension. Electronically Signed   By: Lorin Picket M.D.   On: 03/24/2017 14:32    ASSESSMENT & PLAN:  Grade 3a follicular lymphoma of lymph nodes of multiple regions [facility]) CT imaging did not show any evidence for lymphoma recurrence She will proceed with maintenance rituximab in 2 weeks and I see her back in August for her final treatment.  Dyspnea on exertion The patient has significant dyspnea on exertion CT imaging suggested possible pulmonary hypertension I recommend cardiology consult and she will make appointment to see her cardiologist at the [facility] Her last echocardiogram done in 2016 did not show any suggestion for pulmonary hypertension  Anxiety disorder Her husband noted that the patient have evidence of anxiety disorder I recommend lorazepam as needed   No orders of the defined types were placed in this encounter.  All questions were answered. The patient knows to call the clinic with any problems, questions or concerns. No barriers to learning was detected. I spent 15 minutes counseling the patient face to face. The total time spent in the appointment was 20 minutes and more than 50% was on counseling and review of test results     [name], MD 03/25/2017 1:00 PM

## 2017-03-25 NOTE — Telephone Encounter (Signed)
Scheduled appt per 6/8 los - Gave patient AVS and calender.  

## 2017-04-05 ENCOUNTER — Ambulatory Visit (HOSPITAL_BASED_OUTPATIENT_CLINIC_OR_DEPARTMENT_OTHER): Payer: Medicare HMO

## 2017-04-05 ENCOUNTER — Ambulatory Visit: Payer: Non-veteran care

## 2017-04-05 ENCOUNTER — Other Ambulatory Visit (HOSPITAL_BASED_OUTPATIENT_CLINIC_OR_DEPARTMENT_OTHER): Payer: Medicare HMO

## 2017-04-05 ENCOUNTER — Ambulatory Visit: Payer: Non-veteran care | Admitting: Hematology and Oncology

## 2017-04-05 VITALS — BP 121/75 | HR 67 | Temp 98.4°F | Resp 16

## 2017-04-05 DIAGNOSIS — C8238 Follicular lymphoma grade IIIa, lymph nodes of multiple sites: Secondary | ICD-10-CM

## 2017-04-05 DIAGNOSIS — Z95828 Presence of other vascular implants and grafts: Secondary | ICD-10-CM

## 2017-04-05 DIAGNOSIS — Z5112 Encounter for antineoplastic immunotherapy: Secondary | ICD-10-CM | POA: Diagnosis not present

## 2017-04-05 LAB — COMPREHENSIVE METABOLIC PANEL
ALT: 13 U/L (ref 0–55)
AST: 14 U/L (ref 5–34)
Albumin: 3.8 g/dL (ref 3.5–5.0)
Alkaline Phosphatase: 103 U/L (ref 40–150)
Anion Gap: 9 mEq/L (ref 3–11)
BUN: 23.7 mg/dL (ref 7.0–26.0)
CO2: 25 mEq/L (ref 22–29)
Calcium: 9.6 mg/dL (ref 8.4–10.4)
Chloride: 107 mEq/L (ref 98–109)
Creatinine: 1.2 mg/dL — ABNORMAL HIGH (ref 0.6–1.1)
EGFR: 47 mL/min/{1.73_m2} — ABNORMAL LOW (ref >90)
Glucose: 107 mg/dl (ref 70–140)
Potassium: 3.8 mEq/L (ref 3.5–5.1)
Sodium: 141 mEq/L (ref 136–145)
Total Bilirubin: 0.48 mg/dL (ref 0.20–1.20)
Total Protein: 6.9 g/dL (ref 6.4–8.3)

## 2017-04-05 LAB — CBC WITH DIFFERENTIAL/PLATELET
BASO%: 0.5 % (ref 0.0–2.0)
Basophils Absolute: 0 10*3/uL (ref 0.0–0.1)
EOS%: 2 % (ref 0.0–7.0)
Eosinophils Absolute: 0.1 10*3/uL (ref 0.0–0.5)
HCT: 40 % (ref 34.8–46.6)
HGB: 13 g/dL (ref 11.6–15.9)
LYMPH%: 8.8 % — ABNORMAL LOW (ref 14.0–49.7)
MCH: 27.5 pg (ref 25.1–34.0)
MCHC: 32.5 g/dL (ref 31.5–36.0)
MCV: 84.4 fL (ref 79.5–101.0)
MONO#: 0.7 10*3/uL (ref 0.1–0.9)
MONO%: 10 % (ref 0.0–14.0)
NEUT#: 5.6 10*3/uL (ref 1.5–6.5)
NEUT%: 78.7 % — ABNORMAL HIGH (ref 38.4–76.8)
Platelets: 246 10*3/uL (ref 145–400)
RBC: 4.74 10*6/uL (ref 3.70–5.45)
RDW: 16.4 % — ABNORMAL HIGH (ref 11.2–14.5)
WBC: 7.1 10*3/uL (ref 3.9–10.3)
lymph#: 0.6 10*3/uL — ABNORMAL LOW (ref 0.9–3.3)

## 2017-04-05 LAB — LACTATE DEHYDROGENASE: LDH: 191 U/L (ref 125–245)

## 2017-04-05 MED ORDER — HEPARIN SOD (PORK) LOCK FLUSH 100 UNIT/ML IV SOLN
500.0000 [IU] | Freq: Once | INTRAVENOUS | Status: AC | PRN
  Administered 2017-04-05: 500 [IU]
  Filled 2017-04-05: qty 5

## 2017-04-05 MED ORDER — SODIUM CHLORIDE 0.9 % IV SOLN
Freq: Once | INTRAVENOUS | Status: AC
  Administered 2017-04-05: 10:00:00 via INTRAVENOUS

## 2017-04-05 MED ORDER — ACETAMINOPHEN 325 MG PO TABS
ORAL_TABLET | ORAL | Status: AC
  Filled 2017-04-05: qty 2

## 2017-04-05 MED ORDER — DIPHENHYDRAMINE HCL 25 MG PO CAPS
50.0000 mg | ORAL_CAPSULE | Freq: Once | ORAL | Status: AC
  Administered 2017-04-05: 50 mg via ORAL

## 2017-04-05 MED ORDER — DIPHENHYDRAMINE HCL 25 MG PO CAPS
ORAL_CAPSULE | ORAL | Status: AC
  Filled 2017-04-05: qty 2

## 2017-04-05 MED ORDER — ACETAMINOPHEN 325 MG PO TABS
650.0000 mg | ORAL_TABLET | Freq: Once | ORAL | Status: AC
  Administered 2017-04-05: 650 mg via ORAL

## 2017-04-05 MED ORDER — SODIUM CHLORIDE 0.9 % IJ SOLN
10.0000 mL | INTRAMUSCULAR | Status: DC | PRN
  Administered 2017-04-05 (×2): 10 mL via INTRAVENOUS
  Filled 2017-04-05: qty 10

## 2017-04-05 MED ORDER — SODIUM CHLORIDE 0.9 % IV SOLN
375.0000 mg/m2 | Freq: Once | INTRAVENOUS | Status: AC
  Administered 2017-04-05: 600 mg via INTRAVENOUS
  Filled 2017-04-05: qty 50

## 2017-04-05 MED ORDER — SODIUM CHLORIDE 0.9 % IJ SOLN
10.0000 mL | INTRAMUSCULAR | Status: DC | PRN
  Filled 2017-04-05: qty 10

## 2017-04-05 NOTE — Patient Instructions (Signed)

## 2017-04-05 NOTE — Patient Instructions (Signed)
[facility] Discharge Instructions for Patients Receiving Chemotherapy  Today you received the following chemotherapy agents rituxan.   To help prevent nausea and vomiting after your treatment, we encourage you to take your nausea medication as directed.     If you develop nausea and vomiting that is not controlled by your nausea medication, call the clinic.   BELOW ARE SYMPTOMS THAT SHOULD BE REPORTED IMMEDIATELY:  *FEVER GREATER THAN 100.5 F  *CHILLS WITH OR WITHOUT FEVER  NAUSEA AND VOMITING THAT IS NOT CONTROLLED WITH YOUR NAUSEA MEDICATION  *UNUSUAL SHORTNESS OF BREATH  *UNUSUAL BRUISING OR BLEEDING  TENDERNESS IN MOUTH AND THROAT WITH OR WITHOUT PRESENCE OF ULCERS  *URINARY PROBLEMS  *BOWEL PROBLEMS  UNUSUAL RASH Items with * indicate a potential emergency and should be followed up as soon as possible.  Feel free to call the clinic you have any questions or concerns. The clinic phone number is [phone].  

## 2017-04-07 ENCOUNTER — Telehealth: Payer: Self-pay

## 2017-04-07 ENCOUNTER — Ambulatory Visit (HOSPITAL_BASED_OUTPATIENT_CLINIC_OR_DEPARTMENT_OTHER): Payer: Medicare HMO

## 2017-04-07 VITALS — BP 126/62 | HR 65 | Temp 97.7°F | Resp 22 | Ht 61.0 in | Wt 141.3 lb

## 2017-04-07 DIAGNOSIS — Z5189 Encounter for other specified aftercare: Secondary | ICD-10-CM

## 2017-04-07 DIAGNOSIS — Z95828 Presence of other vascular implants and grafts: Secondary | ICD-10-CM

## 2017-04-07 DIAGNOSIS — C8238 Follicular lymphoma grade IIIa, lymph nodes of multiple sites: Secondary | ICD-10-CM | POA: Diagnosis not present

## 2017-04-07 MED ORDER — SODIUM CHLORIDE 0.9 % IV SOLN
Freq: Once | INTRAVENOUS | Status: AC
  Administered 2017-04-07: 13:00:00 via INTRAVENOUS

## 2017-04-07 MED ORDER — SODIUM CHLORIDE 0.9 % IJ SOLN
10.0000 mL | INTRAMUSCULAR | Status: DC | PRN
  Administered 2017-04-07: 10 mL via INTRAVENOUS
  Filled 2017-04-07: qty 10

## 2017-04-07 MED ORDER — HEPARIN SOD (PORK) LOCK FLUSH 100 UNIT/ML IV SOLN
500.0000 [IU] | Freq: Once | INTRAVENOUS | Status: AC | PRN
  Administered 2017-04-07: 500 [IU] via INTRAVENOUS
  Filled 2017-04-07: qty 5

## 2017-04-07 NOTE — Patient Instructions (Signed)

## 2017-04-07 NOTE — Telephone Encounter (Signed)
Patient left message to call her back. Called patient back, she states that she is feeling to so weak after her treatment on Tuesday and feels like she needs IV fluids. Okayed with Dr. Alvy Bimler she is to come into [facility] today for IV fluids. Scheduling message sent.

## 2017-04-07 NOTE — Progress Notes (Signed)
Pt's main c/o is fatigue.  Pt received Rituxan on Tuesday and states she feels very tired today. Asking for IV fluids. Denies nausea/vomiting/diarrhea. Drinking very adequate po fluids -at least 64 oz daily. VSS  Discussed with Dr. Alvy Bimler. To give pt 511mls IV fluid.  Spent considerable time reviewing side effects of rituxan. Pt's only c/o is fatigue. She appears very anxious and admits that she is anxious.  Reviewed pt's meds with her. She does have lorazepam available for her anxiety. Advised her that she can use it though it can cause drowsiness and she does live alone.  She has good neighbor support.  A neighbor brought her to the clinic today.   Discussed pacing her activities with frequent rest periods.  Encouraged her to continue to drink plenty of fluids and and to eat well balanced diet.  She did have scrambled egg with cheese/toast and bacon this morning. Pt verbalized understanding. Practiced relaxation type breathing with pt and she was able to demonstrate this back to this nurse.

## 2017-06-06 ENCOUNTER — Other Ambulatory Visit (HOSPITAL_BASED_OUTPATIENT_CLINIC_OR_DEPARTMENT_OTHER): Payer: Medicare HMO

## 2017-06-06 ENCOUNTER — Ambulatory Visit (HOSPITAL_BASED_OUTPATIENT_CLINIC_OR_DEPARTMENT_OTHER): Payer: Medicare HMO

## 2017-06-06 ENCOUNTER — Other Ambulatory Visit: Payer: Self-pay | Admitting: Hematology and Oncology

## 2017-06-06 ENCOUNTER — Encounter: Payer: Self-pay | Admitting: Hematology and Oncology

## 2017-06-06 ENCOUNTER — Ambulatory Visit (HOSPITAL_BASED_OUTPATIENT_CLINIC_OR_DEPARTMENT_OTHER): Payer: Medicare HMO | Admitting: Hematology and Oncology

## 2017-06-06 ENCOUNTER — Telehealth: Payer: Self-pay | Admitting: Hematology and Oncology

## 2017-06-06 ENCOUNTER — Ambulatory Visit: Payer: Non-veteran care

## 2017-06-06 VITALS — BP 139/73 | HR 67 | Temp 97.8°F | Resp 18

## 2017-06-06 DIAGNOSIS — I482 Chronic atrial fibrillation, unspecified: Secondary | ICD-10-CM

## 2017-06-06 DIAGNOSIS — Z5189 Encounter for other specified aftercare: Secondary | ICD-10-CM | POA: Diagnosis not present

## 2017-06-06 DIAGNOSIS — C8238 Follicular lymphoma grade IIIa, lymph nodes of multiple sites: Secondary | ICD-10-CM

## 2017-06-06 DIAGNOSIS — Z5112 Encounter for antineoplastic immunotherapy: Secondary | ICD-10-CM

## 2017-06-06 DIAGNOSIS — L989 Disorder of the skin and subcutaneous tissue, unspecified: Secondary | ICD-10-CM

## 2017-06-06 DIAGNOSIS — Z95828 Presence of other vascular implants and grafts: Secondary | ICD-10-CM

## 2017-06-06 LAB — COMPREHENSIVE METABOLIC PANEL
ALT: 12 U/L (ref 0–55)
AST: 16 U/L (ref 5–34)
Albumin: 3.9 g/dL (ref 3.5–5.0)
Alkaline Phosphatase: 99 U/L (ref 40–150)
Anion Gap: 7 mEq/L (ref 3–11)
BUN: 24.4 mg/dL (ref 7.0–26.0)
CO2: 26 mEq/L (ref 22–29)
Calcium: 9.6 mg/dL (ref 8.4–10.4)
Chloride: 109 mEq/L (ref 98–109)
Creatinine: 1.1 mg/dL (ref 0.6–1.1)
EGFR: 47 mL/min/{1.73_m2} — ABNORMAL LOW (ref >90)
Glucose: 87 mg/dl (ref 70–140)
Potassium: 4.3 mEq/L (ref 3.5–5.1)
Sodium: 141 mEq/L (ref 136–145)
Total Bilirubin: 0.34 mg/dL (ref 0.20–1.20)
Total Protein: 6.9 g/dL (ref 6.4–8.3)

## 2017-06-06 LAB — CBC WITH DIFFERENTIAL/PLATELET
BASO%: 0.3 % (ref 0.0–2.0)
Basophils Absolute: 0 10*3/uL (ref 0.0–0.1)
EOS%: 2 % (ref 0.0–7.0)
Eosinophils Absolute: 0.2 10*3/uL (ref 0.0–0.5)
HCT: 40 % (ref 34.8–46.6)
HGB: 12.7 g/dL (ref 11.6–15.9)
LYMPH%: 11.4 % — ABNORMAL LOW (ref 14.0–49.7)
MCH: 28.3 pg (ref 25.1–34.0)
MCHC: 31.8 g/dL (ref 31.5–36.0)
MCV: 89.1 fL (ref 79.5–101.0)
MONO#: 0.9 10*3/uL (ref 0.1–0.9)
MONO%: 11.6 % (ref 0.0–14.0)
NEUT#: 5.7 10*3/uL (ref 1.5–6.5)
NEUT%: 74.7 % (ref 38.4–76.8)
Platelets: 253 10*3/uL (ref 145–400)
RBC: 4.49 10*6/uL (ref 3.70–5.45)
RDW: 15.4 % — ABNORMAL HIGH (ref 11.2–14.5)
WBC: 7.6 10*3/uL (ref 3.9–10.3)
lymph#: 0.9 10*3/uL (ref 0.9–3.3)
nRBC: 0 % (ref 0–0)

## 2017-06-06 LAB — LACTATE DEHYDROGENASE: LDH: 227 U/L (ref 125–245)

## 2017-06-06 MED ORDER — SODIUM CHLORIDE 0.9 % IV SOLN
375.0000 mg/m2 | Freq: Once | INTRAVENOUS | Status: AC
  Administered 2017-06-06: 600 mg via INTRAVENOUS
  Filled 2017-06-06: qty 50

## 2017-06-06 MED ORDER — SODIUM CHLORIDE 0.9 % IJ SOLN
10.0000 mL | INTRAMUSCULAR | Status: DC | PRN
  Administered 2017-06-06: 10 mL
  Filled 2017-06-06: qty 10

## 2017-06-06 MED ORDER — HEPARIN SOD (PORK) LOCK FLUSH 100 UNIT/ML IV SOLN
500.0000 [IU] | Freq: Once | INTRAVENOUS | Status: AC | PRN
  Administered 2017-06-06: 500 [IU]
  Filled 2017-06-06: qty 5

## 2017-06-06 MED ORDER — DIPHENHYDRAMINE HCL 25 MG PO CAPS
50.0000 mg | ORAL_CAPSULE | Freq: Once | ORAL | Status: AC
  Administered 2017-06-06: 50 mg via ORAL

## 2017-06-06 MED ORDER — ACETAMINOPHEN 325 MG PO TABS
650.0000 mg | ORAL_TABLET | Freq: Once | ORAL | Status: AC
  Administered 2017-06-06: 650 mg via ORAL

## 2017-06-06 MED ORDER — DIPHENHYDRAMINE HCL 25 MG PO CAPS
ORAL_CAPSULE | ORAL | Status: AC
  Filled 2017-06-06: qty 2

## 2017-06-06 MED ORDER — ACETAMINOPHEN 325 MG PO TABS
ORAL_TABLET | ORAL | Status: AC
  Filled 2017-06-06: qty 2

## 2017-06-06 MED ORDER — ALTEPLASE 2 MG IJ SOLR
2.0000 mg | Freq: Once | INTRAMUSCULAR | Status: AC | PRN
  Administered 2017-06-06: 2 mg
  Filled 2017-06-06: qty 2

## 2017-06-06 MED ORDER — SODIUM CHLORIDE 0.9 % IV SOLN
Freq: Once | INTRAVENOUS | Status: AC
  Administered 2017-06-06: 12:00:00 via INTRAVENOUS

## 2017-06-06 MED ORDER — SODIUM CHLORIDE 0.9 % IJ SOLN
10.0000 mL | INTRAMUSCULAR | Status: DC | PRN
  Filled 2017-06-06: qty 10

## 2017-06-06 NOTE — Patient Instructions (Signed)

## 2017-06-06 NOTE — Patient Instructions (Signed)
[facility] Discharge Instructions for Patients Receiving Chemotherapy  Today you received the following chemotherapy agents Rituxan  To help prevent nausea and vomiting after your treatment, we encourage you to take your nausea medication    If you develop nausea and vomiting that is not controlled by your nausea medication, call the clinic.   BELOW ARE SYMPTOMS THAT SHOULD BE REPORTED IMMEDIATELY:  *FEVER GREATER THAN 100.5 F  *CHILLS WITH OR WITHOUT FEVER  NAUSEA AND VOMITING THAT IS NOT CONTROLLED WITH YOUR NAUSEA MEDICATION  *UNUSUAL SHORTNESS OF BREATH  *UNUSUAL BRUISING OR BLEEDING  TENDERNESS IN MOUTH AND THROAT WITH OR WITHOUT PRESENCE OF ULCERS  *URINARY PROBLEMS  *BOWEL PROBLEMS  UNUSUAL RASH Items with * indicate a potential emergency and should be followed up as soon as possible.  Feel free to call the clinic you have any questions or concerns. The clinic phone number is [phone].  Please show the CHEMO ALERT CARD at check-in to the Emergency Department and triage nurse.   

## 2017-06-06 NOTE — Telephone Encounter (Signed)
Scheduled appt per 8/20 los - patient to get new schedule in the treatment area - patient called back to treatment before schedule was finished.

## 2017-06-06 NOTE — Progress Notes (Signed)
Cathflo administered by Sharlynn Oliphant RN

## 2017-06-06 NOTE — Assessment & Plan Note (Signed)
She appears to be in sinus rhythm on exam She will continue medical management and chronic anticoagulation therapy as directed by her cardiologist. 

## 2017-06-06 NOTE — Assessment & Plan Note (Signed)
She had one skin lesion on the left upper forehead and one on the back of her left ear They are subcutaneous in nature They do not feel like lymphoma I recommend dermatology review.

## 2017-06-06 NOTE — Progress Notes (Signed)
[facility] OFFICE PROGRESS NOTE  Patient Care Team: [name], MD as PCP - General (Internal Medicine) Carola Frost, RN as Registered Nurse (Medical Oncology) [name], MD as Attending Physician (Internal Medicine)  SUMMARY OF ONCOLOGIC HISTORY:   Grade 3a follicular lymphoma of lymph nodes of multiple regions [facility])   01/21/2015 Surgery    She underwent excisional lymph node biopsy that came back follicular lymphoma grade 3      01/21/2015 Pathology Results    Accession: [id number] biopsy confirmed follicular lymphoma      02/07/2015 Imaging    Echocardiogram showed ejection fraction of 55-60%      02/11/2015 Imaging     PET CT scan show possible splenic involvement and diffuse lymphadenopathy throughout      02/11/2015 Bone Marrow Biopsy     bone marrow biopsy was performed and is involved by lymphoma with translocation of igH/BCL2      02/13/2015 Procedure    She had port placement.      02/17/2015 - 06/02/2015 Chemotherapy    She received R-CHOP chemo x 6      02/17/2015 Adverse Reaction    She had mild infusion reaction with cycle 1 of treatment.      04/18/2015 Imaging    PET CT scan showed near complete response to treatment.      04/22/2015 Adverse Reaction    Vincristine dose was reduced by 50% due to neuropathy from cycle 4 onwards      06/25/2015 - 07/06/2015 Hospital Admission    She was hospitalized for recent sepsis/bacteremia and a fib with RVR      07/11/2015 Imaging    repeat PEt scan showed complete response to Rx      07/14/2015 -  Chemotherapy    She received maintenance Rituximab every 60 days      12/15/2015 Imaging    PET CT showed no evidence of cancer recurrence      06/15/2016 PET scan    No evidence of active lymphoma on skullbase to thigh FDG PET scan. Small LEFT periaortic retroperitoneal lymph nodes without significant metabolic activity ( [place] 1). No change from prior      03/24/2017 Imaging    1.  Borderline enlarged abdominal retroperitoneal lymph nodes, stable. No new adenopathy in the chest, abdomen or pelvis. 2. Aortic atherosclerosis (ICD10-170.0). 3. Enlarged pulmonary arteries, indicative of pulmonary arterial hypertension.       INTERVAL HISTORY: Please see below for problem oriented charting. She returns for cycle #12 of rituximab. She denies recent infection No new lymphadenopathy. She complained of palpable skin lesion on the top of her left forehead and behind that here.  They are not painful.  They are not progressive She continues her heart medication as directed by her cardiologist.  She have occasional shortness of breath.  No chest pain. The patient denies any recent signs or symptoms of bleeding such as spontaneous epistaxis, hematuria or hematochezia.   REVIEW OF SYSTEMS:   Constitutional: Denies fevers, chills or abnormal weight loss Eyes: Denies blurriness of vision Ears, nose, mouth, throat, and face: Denies mucositis or sore throat Respiratory: Denies cough, dyspnea or wheezes Cardiovascular: Denies palpitation, chest discomfort or lower extremity swelling Gastrointestinal:  Denies nausea, heartburn or change in bowel habits Lymphatics: Denies new lymphadenopathy or easy bruising Neurological:Denies numbness, tingling or new weaknesses Behavioral/Psych: Mood is stable, no new changes  All other systems were reviewed with the patient and are negative.  I have reviewed the  past medical history, past surgical history, social history and family history with the patient and they are unchanged from previous note.  ALLERGIES:  has No Known Allergies.  MEDICATIONS:  Current Outpatient Prescriptions  Medication Sig Dispense Refill  . apixaban (ELIQUIS) 5 MG TABS tablet Take 5 mg by mouth 2 (two) times daily.     . calcium carbonate (TUMS) 500 MG chewable tablet Chew 2 tablets (400 mg of elemental calcium total) by mouth daily. 2 tablet 0  . diltiazem  (CARDIZEM CD) 240 MG 24 hr capsule Take 1 capsule (240 mg total) by mouth daily. 30 capsule 5  . diphenhydrAMINE (BENADRYL) 25 mg capsule Take 25 mg by mouth at bedtime as needed for sleep.    Marland Kitchen HYDROcodone-acetaminophen (NORCO/VICODIN) 5-325 MG tablet Take 1 tablet by mouth every 6 (six) hours as needed. 10 tablet 0  . levothyroxine (SYNTHROID, LEVOTHROID) 100 MCG tablet Take 100 mcg by mouth daily.  9  . lidocaine-prilocaine (EMLA) cream Apply 1 application topically as needed. Apply to port a cath site one hour prior to needle stick 30 g 2  . NON FORMULARY Take by mouth.     No current facility-administered medications for this visit.    Facility-Administered Medications Ordered in Other Visits  Medication Dose Route Frequency Provider Last Rate Last Dose  . ondansetron (ZOFRAN) 8 mg in sodium chloride 0.9 % 50 mL IVPB   Intravenous Once Tiwanna Tuch, MD      . sodium chloride 0.9 % injection 10 mL  10 mL Intravenous PRN Alvy Bimler, Nyquan Selbe, MD   10 mL at 04/05/17 1315    PHYSICAL EXAMINATION: ECOG PERFORMANCE STATUS: 1 - Symptomatic but completely ambulatory  Vitals:   06/06/17 0952  BP: (!) 146/76  Pulse: 72  Resp: 18  Temp: 98 F (36.7 C)  SpO2: 99%   Filed Weights   06/06/17 0952  Weight: 141 lb 12.8 oz (64.3 kg)    GENERAL:alert, no distress and comfortable SKIN: She has palpable subcutaneous nodule on the left forehead and behind the left ear EYES: normal, Conjunctiva are pink and non-injected, sclera clear OROPHARYNX:no exudate, no erythema and lips, buccal mucosa, and tongue normal  NECK: supple, thyroid normal size, non-tender, without nodularity LYMPH:  no palpable lymphadenopathy in the cervical, axillary or inguinal LUNGS: clear to auscultation and percussion with normal breathing effort HEART: regular rate & rhythm and no murmurs and no lower extremity edema ABDOMEN:abdomen soft, non-tender and normal bowel sounds Musculoskeletal:no cyanosis of digits and no clubbing   NEURO: alert & oriented x 3 with fluent speech, no focal motor/sensory deficits  LABORATORY DATA:  I have reviewed the data as listed    Component Value Date/Time   NA 141 04/05/2017 0847   K 3.8 04/05/2017 0847   CL 104 10/26/2016 2215   CO2 25 04/05/2017 0847   GLUCOSE 107 04/05/2017 0847   BUN 23.7 04/05/2017 0847   CREATININE 1.2 (H) 04/05/2017 0847   CALCIUM 9.6 04/05/2017 0847   PROT 6.9 04/05/2017 0847   ALBUMIN 3.8 04/05/2017 0847   AST 14 04/05/2017 0847   ALT 13 04/05/2017 0847   ALKPHOS 103 04/05/2017 0847   BILITOT 0.48 04/05/2017 0847   GFRNONAA 35 (L) 10/26/2016 2215   GFRAA 40 (L) 10/26/2016 2215    No results found for: SPEP, UPEP  Lab Results  Component Value Date   WBC 7.1 04/05/2017   NEUTROABS 5.6 04/05/2017   HGB 13.0 04/05/2017   HCT 40.0 04/05/2017   MCV  84.4 04/05/2017   PLT 246 04/05/2017      Chemistry      Component Value Date/Time   NA 141 04/05/2017 0847   K 3.8 04/05/2017 0847   CL 104 10/26/2016 2215   CO2 25 04/05/2017 0847   BUN 23.7 04/05/2017 0847   CREATININE 1.2 (H) 04/05/2017 0847      Component Value Date/Time   CALCIUM 9.6 04/05/2017 0847   ALKPHOS 103 04/05/2017 0847   AST 14 04/05/2017 0847   ALT 13 04/05/2017 0847   BILITOT 0.48 04/05/2017 0847      ASSESSMENT & PLAN:  Grade 3a follicular lymphoma of lymph nodes of multiple regions [facility]) She will complete final treatment today After that, I will continue history, physical examination and blood work every 3 months in the first 2 years of discontinuation of treatment Her last CT scan from June 2018 show no evidence of disease I plan to repeat imaging study in December 2018.  Skin lesions She had one skin lesion on the left upper forehead and one on the back of her left ear They are subcutaneous in nature They do not feel like lymphoma I recommend dermatology review.  Chronic atrial fibrillation (HCC) She appears to be in sinus rhythm on exam She will  continue medical management and chronic anticoagulation therapy as directed by her cardiologist.   No orders of the defined types were placed in this encounter.  All questions were answered. The patient knows to call the clinic with any problems, questions or concerns. No barriers to learning was detected. I spent 15 minutes counseling the patient face to face. The total time spent in the appointment was 20 minutes and more than 50% was on counseling and review of test results     [name], MD 06/06/2017 10:12 AM

## 2017-06-06 NOTE — Assessment & Plan Note (Signed)
She will complete final treatment today After that, I will continue history, physical examination and blood work every 3 months in the first 2 years of discontinuation of treatment Her last CT scan from June 2018 show no evidence of disease I plan to repeat imaging study in December 2018.

## 2017-07-18 ENCOUNTER — Ambulatory Visit (HOSPITAL_BASED_OUTPATIENT_CLINIC_OR_DEPARTMENT_OTHER): Payer: Medicare HMO

## 2017-07-18 DIAGNOSIS — Z95828 Presence of other vascular implants and grafts: Secondary | ICD-10-CM

## 2017-07-18 DIAGNOSIS — C8238 Follicular lymphoma grade IIIa, lymph nodes of multiple sites: Secondary | ICD-10-CM

## 2017-07-18 DIAGNOSIS — Z452 Encounter for adjustment and management of vascular access device: Secondary | ICD-10-CM | POA: Diagnosis not present

## 2017-07-18 MED ORDER — SODIUM CHLORIDE 0.9 % IJ SOLN
10.0000 mL | INTRAMUSCULAR | Status: DC | PRN
  Administered 2017-07-18: 10 mL via INTRAVENOUS
  Filled 2017-07-18: qty 10

## 2017-07-18 MED ORDER — HEPARIN SOD (PORK) LOCK FLUSH 100 UNIT/ML IV SOLN
500.0000 [IU] | Freq: Once | INTRAVENOUS | Status: AC | PRN
Start: 2017-07-18 — End: 2017-07-18
  Administered 2017-07-18: 500 [IU] via INTRAVENOUS
  Filled 2017-07-18: qty 5

## 2017-08-29 ENCOUNTER — Encounter: Payer: Self-pay | Admitting: Hematology and Oncology

## 2017-08-29 ENCOUNTER — Telehealth: Payer: Self-pay | Admitting: Hematology and Oncology

## 2017-08-29 ENCOUNTER — Ambulatory Visit (HOSPITAL_BASED_OUTPATIENT_CLINIC_OR_DEPARTMENT_OTHER): Payer: Medicare HMO | Admitting: Hematology and Oncology

## 2017-08-29 ENCOUNTER — Ambulatory Visit: Payer: Medicare HMO

## 2017-08-29 ENCOUNTER — Other Ambulatory Visit (HOSPITAL_BASED_OUTPATIENT_CLINIC_OR_DEPARTMENT_OTHER): Payer: Medicare HMO

## 2017-08-29 VITALS — BP 158/81 | HR 84 | Temp 98.1°F | Resp 18 | Ht 61.0 in | Wt 144.5 lb

## 2017-08-29 DIAGNOSIS — C8238 Follicular lymphoma grade IIIa, lymph nodes of multiple sites: Secondary | ICD-10-CM

## 2017-08-29 DIAGNOSIS — R229 Localized swelling, mass and lump, unspecified: Secondary | ICD-10-CM | POA: Diagnosis not present

## 2017-08-29 DIAGNOSIS — I482 Chronic atrial fibrillation, unspecified: Secondary | ICD-10-CM

## 2017-08-29 DIAGNOSIS — M5441 Lumbago with sciatica, right side: Secondary | ICD-10-CM

## 2017-08-29 DIAGNOSIS — G8929 Other chronic pain: Secondary | ICD-10-CM

## 2017-08-29 DIAGNOSIS — M791 Myalgia, unspecified site: Secondary | ICD-10-CM

## 2017-08-29 DIAGNOSIS — Z95828 Presence of other vascular implants and grafts: Secondary | ICD-10-CM

## 2017-08-29 DIAGNOSIS — M542 Cervicalgia: Secondary | ICD-10-CM

## 2017-08-29 LAB — COMPREHENSIVE METABOLIC PANEL
ALT: 16 U/L (ref 0–55)
AST: 18 U/L (ref 5–34)
Albumin: 3.8 g/dL (ref 3.5–5.0)
Alkaline Phosphatase: 93 U/L (ref 40–150)
Anion Gap: 8 mEq/L (ref 3–11)
BUN: 24 mg/dL (ref 7.0–26.0)
CO2: 23 mEq/L (ref 22–29)
Calcium: 8.9 mg/dL (ref 8.4–10.4)
Chloride: 111 mEq/L — ABNORMAL HIGH (ref 98–109)
Creatinine: 1 mg/dL (ref 0.6–1.1)
EGFR: 54 mL/min/{1.73_m2} — ABNORMAL LOW (ref >60)
Glucose: 97 mg/dl (ref 70–140)
Potassium: 4.2 mEq/L (ref 3.5–5.1)
Sodium: 142 mEq/L (ref 136–145)
Total Bilirubin: 0.33 mg/dL (ref 0.20–1.20)
Total Protein: 6.4 g/dL (ref 6.4–8.3)

## 2017-08-29 LAB — CBC WITH DIFFERENTIAL/PLATELET
BASO%: 0.6 % (ref 0.0–2.0)
Basophils Absolute: 0 10*3/uL (ref 0.0–0.1)
EOS%: 4 % (ref 0.0–7.0)
Eosinophils Absolute: 0.2 10*3/uL (ref 0.0–0.5)
HCT: 38.3 % (ref 34.8–46.6)
HGB: 12.2 g/dL (ref 11.6–15.9)
LYMPH%: 11.3 % — ABNORMAL LOW (ref 14.0–49.7)
MCH: 29.1 pg (ref 25.1–34.0)
MCHC: 31.9 g/dL (ref 31.5–36.0)
MCV: 91.4 fL (ref 79.5–101.0)
MONO#: 0.6 10*3/uL (ref 0.1–0.9)
MONO%: 11.3 % (ref 0.0–14.0)
NEUT#: 3.8 10*3/uL (ref 1.5–6.5)
NEUT%: 72.8 % (ref 38.4–76.8)
Platelets: 245 10*3/uL (ref 145–400)
RBC: 4.19 10*6/uL (ref 3.70–5.45)
RDW: 14.7 % — ABNORMAL HIGH (ref 11.2–14.5)
WBC: 5.2 10*3/uL (ref 3.9–10.3)
lymph#: 0.6 10*3/uL — ABNORMAL LOW (ref 0.9–3.3)

## 2017-08-29 LAB — LACTATE DEHYDROGENASE: LDH: 224 U/L (ref 125–245)

## 2017-08-29 MED ORDER — HEPARIN SOD (PORK) LOCK FLUSH 100 UNIT/ML IV SOLN
500.0000 [IU] | Freq: Once | INTRAVENOUS | Status: AC | PRN
  Administered 2017-08-29: 500 [IU] via INTRAVENOUS
  Filled 2017-08-29: qty 5

## 2017-08-29 MED ORDER — SODIUM CHLORIDE 0.9 % IJ SOLN
10.0000 mL | INTRAMUSCULAR | Status: DC | PRN
  Administered 2017-08-29: 10 mL via INTRAVENOUS
  Filled 2017-08-29: qty 10

## 2017-08-29 NOTE — Assessment & Plan Note (Signed)
The patient is concerned about new lymphadenopathy on the occipital region of her head She denies recent upper respiratory tract illness Examination revealed palpable subcutaneous nodules on multiple locations The cause is unknown but lymphoma cannot be excluded I plan to order PET CT scan to be done in 2 weeks after the holidays and we will see her back to discuss plan of care

## 2017-08-29 NOTE — Assessment & Plan Note (Signed)
She appears to be in sinus rhythm on exam She will continue medical management and chronic anticoagulation therapy as directed by her cardiologist.

## 2017-08-29 NOTE — Progress Notes (Signed)
[name] OFF[facility] PROGRESS NOTE  Patient Care Team: [name], MD as PCP - General (Internal Medicine) Carola Frost, RN as Registered Nurse (Medical Oncology) [name], MD as Attending Physician (Internal Medicine)  SUMMARY OF ONCOLOGIC HISTORY:   Grade 3a follicular lymphoma of lymph nodes of multiple regions [facility])   01/21/2015 Surgery    She underwent excisional lymph node biopsy that came back follicular lymphoma grade 3      01/21/2015 Pathology Results    Accession: [id number] biopsy confirmed follicular lymphoma      02/07/2015 Imaging    Echocardiogram showed ejection fraction of 55-60%      02/11/2015 Imaging     PET CT scan show possible splenic involvement and diffuse lymphadenopathy throughout      02/11/2015 Bone Marrow Biopsy     bone marrow biopsy was performed and is involved by lymphoma with translocation of igH/BCL2      02/13/2015 Procedure    She had port placement.      02/17/2015 - 06/02/2015 Chemotherapy    She received R-CHOP chemo x 6      02/17/2015 Adverse Reaction    She had mild infusion reaction with cycle 1 of treatment.      04/18/2015 Imaging    PET CT scan showed near complete response to treatment.      04/22/2015 Adverse Reaction    Vincristine dose was reduced by 50% due to neuropathy from cycle 4 onwards      06/25/2015 - 07/06/2015 Hospital Admission    She was hospitalized for recent sepsis/bacteremia and a fib with RVR      07/11/2015 Imaging    repeat PEt scan showed complete response to Rx      07/14/2015 -  Chemotherapy    She received maintenance Rituximab every 60 days      12/15/2015 Imaging    PET CT showed no evidence of cancer recurrence      06/15/2016 PET scan    No evidence of active lymphoma on skullbase to thigh FDG PET scan. Small LEFT periaortic retroperitoneal lymph nodes without significant metabolic activity ( [place]ville 1). No change from prior      03/24/2017 Imaging    1.  Borderline enlarged abdominal retroperitoneal lymph nodes, stable. No new adenopathy in the chest, abdomen or pelvis. 2. Aortic atherosclerosis (ICD10-170.0). 3. Enlarged pulmonary arteries, indicative of pulmonary arterial hypertension.       INTERVAL HISTORY: Please see below for problem oriented charting. She returns with her daughter for further follow-up She complain of chronic neck pain and has seen multiple specialists including orthopedic surgeon recently She had mild carpal tunnel syndrome affecting her left hand She complain of palpable lymphadenopathy near the left occipital region She had no lymphadenopathy elsewhere Denies recent infection The patient denies any recent signs or symptoms of bleeding such as spontaneous epistaxis, hematuria or hematochezia.  REVIEW OF SYSTEMS:   Constitutional: Denies fevers, chills or abnormal weight loss Eyes: Denies blurriness of vision Ears, nose, mouth, throat, and face: Denies mucositis or sore throat Respiratory: Denies cough, dyspnea or wheezes Cardiovascular: Denies palpitation, chest discomfort or lower extremity swelling Gastrointestinal:  Denies nausea, heartburn or change in bowel habits Skin: Denies abnormal skin rashes Neurological:Denies numbness, tingling or new weaknesses Behavioral/Psych: Mood is stable, no new changes  All other systems were reviewed with the patient and are negative.  I have reviewed the past medical history, past surgical history, social history and family history with the patient  and they are unchanged from previous note.  ALLERGIES:  has No Known Allergies.  MEDICATIONS:  Current Outpatient Medications  Medication Sig Dispense Refill  . apixaban (ELIQUIS) 5 MG TABS tablet Take 5 mg by mouth 2 (two) times daily.     . calcium carbonate (TUMS) 500 MG chewable tablet Chew 2 tablets (400 mg of elemental calcium total) by mouth daily. 2 tablet 0  . diltiazem (CARDIZEM CD) 240 MG 24 hr capsule Take  1 capsule (240 mg total) by mouth daily. 30 capsule 5  . diphenhydrAMINE (BENADRYL) 25 mg capsule Take 25 mg by mouth at bedtime as needed for sleep.    Marland Kitchen HYDROcodone-acetaminophen (NORCO/VICODIN) 5-325 MG tablet Take 1 tablet by mouth every 6 (six) hours as needed. 10 tablet 0  . levothyroxine (SYNTHROID, LEVOTHROID) 100 MCG tablet Take 100 mcg by mouth daily.  9  . lidocaine-prilocaine (EMLA) cream Apply 1 application topically as needed. Apply to port a cath site one hour prior to needle stick 30 g 2  . NON FORMULARY Take by mouth.     No current facility-administered medications for this visit.    Facility-Administered Medications Ordered in Other Visits  Medication Dose Route Frequency Provider Last Rate Last Dose  . ondansetron (ZOFRAN) 8 mg in sodium chloride 0.9 % 50 mL IVPB   Intravenous Once Coryn Mosso, MD      . sodium chloride 0.9 % injection 10 mL  10 mL Intravenous PRN Alvy Bimler, Bernese Doffing, MD   10 mL at 04/05/17 1315    PHYSICAL EXAMINATION: ECOG PERFORMANCE STATUS: 1 - Symptomatic but completely ambulatory  Vitals:   08/29/17 0928  BP: (!) 158/81  Pulse: 84  Resp: 18  Temp: 98.1 F (36.7 C)  SpO2: 99%   Filed Weights   08/29/17 0928  Weight: 144 lb 8 oz (65.5 kg)    GENERAL:alert, no distress and comfortable SKIN: skin color, texture, turgor are normal, no rashes or significant lesions EYES: normal, Conjunctiva are pink and non-injected, sclera clear OROPHARYNX:no exudate, no erythema and lips, buccal mucosa, and tongue normal  NECK: supple, thyroid normal size, non-tender, without nodularity LYMPH: She has palpable subcutaneous nodule in the left occipital region.  No other lymphadenopathy elsewhere LUNGS: clear to auscultation and percussion with normal breathing effort HEART: regular rate & rhythm and no murmurs and no lower extremity edema ABDOMEN:abdomen soft, non-tender and normal bowel sounds Musculoskeletal:no cyanosis of digits and no clubbing  NEURO: alert  & oriented x 3 with fluent speech, no focal motor/sensory deficits  LABORATORY DATA:  I have reviewed the data as listed    Component Value Date/Time   NA 141 06/06/2017 0910   K 4.3 06/06/2017 0910   CL 104 10/26/2016 2215   CO2 26 06/06/2017 0910   GLUCOSE 87 06/06/2017 0910   BUN 24.4 06/06/2017 0910   CREATININE 1.1 06/06/2017 0910   CALCIUM 9.6 06/06/2017 0910   PROT 6.9 06/06/2017 0910   ALBUMIN 3.9 06/06/2017 0910   AST 16 06/06/2017 0910   ALT 12 06/06/2017 0910   ALKPHOS 99 06/06/2017 0910   BILITOT 0.34 06/06/2017 0910   GFRNONAA 35 (L) 10/26/2016 2215   GFRAA 40 (L) 10/26/2016 2215    No results found for: SPEP, UPEP  Lab Results  Component Value Date   WBC 5.2 08/29/2017   NEUTROABS 3.8 08/29/2017   HGB 12.2 08/29/2017   HCT 38.3 08/29/2017   MCV 91.4 08/29/2017   PLT 245 08/29/2017      Chemistry  Component Value Date/Time   NA 141 06/06/2017 0910   K 4.3 06/06/2017 0910   CL 104 10/26/2016 2215   CO2 26 06/06/2017 0910   BUN 24.4 06/06/2017 0910   CREATININE 1.1 06/06/2017 0910      Component Value Date/Time   CALCIUM 9.6 06/06/2017 0910   ALKPHOS 99 06/06/2017 0910   AST 16 06/06/2017 0910   ALT 12 06/06/2017 0910   BILITOT 0.34 06/06/2017 0910      ASSESSMENT & PLAN:  Grade 3a follicular lymphoma of lymph nodes of multiple regions [facility]) The patient is concerned about new lymphadenopathy on the occipital region of her head She denies recent upper respiratory tract illness Examination revealed palpable subcutaneous nodules on multiple locations The cause is unknown but lymphoma cannot be excluded I plan to order PET CT scan to be done in 2 weeks after the holidays and we will see her back to discuss plan of care  Chronic back pain She has chronic degenerative diffuse musculoskeletal pain especially in her neck region She has seen orthopedic specialist with plan for possible injection to her spine for therapeutic relief We discussed  pain management and she would like to talk to her [place] physician for pain management. We discussed potential referral to physical therapist  Chronic atrial fibrillation ([place]) She appears to be in sinus rhythm on exam She will continue medical management and chronic anticoagulation therapy as directed by her cardiologist.   Orders Placed This Encounter  Procedures  . NM PET Image Restag (PS) Skull Base To Thigh    Standing Status:   Future    Standing Expiration Date:   08/29/2018    Order Specific Question:   If indicated for the ordered procedure, I authorize the administration of a radiopharmaceutical per Radiology protocol    Answer:   Yes    Order Specific Question:   Preferred imaging location?    Answer:   [facility]    Order Specific Question:   Radiology Contrast Protocol - do NOT remove file path    Answer:   [email or web address]   All questions were answered. The patient knows to call the clinic with any problems, questions or concerns. No barriers to learning was detected. I spent 15 minutes counseling the patient face to face. The total time spent in the appointment was 20 minutes and more than 50% was on counseling and review of test results     [name], MD 08/29/2017 9:54 AM

## 2017-08-29 NOTE — Assessment & Plan Note (Signed)
She has chronic degenerative diffuse musculoskeletal pain especially in her neck region She has seen orthopedic specialist with plan for possible injection to her spine for therapeutic relief We discussed pain management and she would like to talk to her [place] physician for pain management. We discussed potential referral to physical therapist

## 2017-08-29 NOTE — Telephone Encounter (Signed)
Scheduled appt per 11/12 los - Gave patient AVS and calender per los.  

## 2017-09-13 ENCOUNTER — Ambulatory Visit (HOSPITAL_COMMUNITY): Payer: Medicare HMO

## 2017-09-14 ENCOUNTER — Ambulatory Visit (HOSPITAL_COMMUNITY)
Admission: RE | Admit: 2017-09-14 | Discharge: 2017-09-14 | Disposition: A | Discharge status: Home / Self Care | Payer: Medicare HMO | Source: Ambulatory Visit | Attending: Hematology and Oncology | Admitting: Hematology and Oncology

## 2017-09-14 DIAGNOSIS — C8220 Follicular lymphoma grade III, unspecified, unspecified site: Secondary | ICD-10-CM | POA: Diagnosis not present

## 2017-09-14 DIAGNOSIS — C8238 Follicular lymphoma grade IIIa, lymph nodes of multiple sites: Secondary | ICD-10-CM | POA: Insufficient documentation

## 2017-09-14 LAB — GLUCOSE, CAPILLARY: Glucose-Capillary: 76 mg/dL (ref 65–99)

## 2017-09-14 MED ORDER — FLUDEOXYGLUCOSE F - 18 (FDG) INJECTION
7.3000 | Freq: Once | INTRAVENOUS | Status: AC | PRN
  Administered 2017-09-14: 7.3 via INTRAVENOUS

## 2017-09-16 ENCOUNTER — Telehealth: Payer: Self-pay | Admitting: Hematology and Oncology

## 2017-09-16 ENCOUNTER — Encounter: Payer: Self-pay | Admitting: Hematology and Oncology

## 2017-09-16 ENCOUNTER — Ambulatory Visit (HOSPITAL_BASED_OUTPATIENT_CLINIC_OR_DEPARTMENT_OTHER): Payer: Medicare HMO | Admitting: Hematology and Oncology

## 2017-09-16 VITALS — BP 111/94 | HR 120 | Temp 97.9°F | Resp 18 | Ht 61.0 in | Wt 141.2 lb

## 2017-09-16 DIAGNOSIS — C8238 Follicular lymphoma grade IIIa, lymph nodes of multiple sites: Secondary | ICD-10-CM

## 2017-09-16 DIAGNOSIS — L989 Disorder of the skin and subcutaneous tissue, unspecified: Secondary | ICD-10-CM

## 2017-09-16 NOTE — Progress Notes (Signed)
[facility] OFFICE PROGRESS NOTE  Patient Care Team: Sekhon, Manharprett, MD as PCP - General (Internal Medicine) Dixon, Gina B, RN as Registered Nurse (Medical Oncology) Sekhon, Manharprett, MD as Attending Physician (Internal Medicine)  SUMMARY OF ONCOLOGIC HISTORY:   Grade 3a follicular lymphoma of lymph nodes of multiple regions (HCC)   01/21/2015 Surgery    She underwent excisional lymph node biopsy that came back follicular lymphoma grade 3      01/21/2015 Pathology Results    Accession: [id number] biopsy confirmed follicular lymphoma      02/07/2015 Imaging    Echocardiogram showed ejection fraction of 55-60%      02/11/2015 Imaging     PET CT scan show possible splenic involvement and diffuse lymphadenopathy throughout      02/11/2015 Bone Marrow Biopsy     bone marrow biopsy was performed and is involved by lymphoma with translocation of igH/BCL2      02/13/2015 Procedure    She had port placement.      02/17/2015 - 06/02/2015 Chemotherapy    She received R-CHOP chemo x 6      02/17/2015 Adverse Reaction    She had mild infusion reaction with cycle 1 of treatment.      04/18/2015 Imaging    PET CT scan showed near complete response to treatment.      04/22/2015 Adverse Reaction    Vincristine dose was reduced by 50% due to neuropathy from cycle 4 onwards      06/25/2015 - 07/06/2015 Hospital Admission    She was hospitalized for recent sepsis/bacteremia and a fib with RVR      07/11/2015 Imaging    repeat PEt scan showed complete response to Rx      07/14/2015 - 06/16/2017 Chemotherapy    She received maintenance Rituximab every 60 days      12/15/2015 Imaging    PET CT showed no evidence of cancer recurrence      06/15/2016 PET scan    No evidence of active lymphoma on skullbase to thigh FDG PET scan. Small LEFT periaortic retroperitoneal lymph nodes without significant metabolic activity ( Deauville 1). No change from prior      03/24/2017 Imaging     1. Borderline enlarged abdominal retroperitoneal lymph nodes, stable. No new adenopathy in the chest, abdomen or pelvis. 2. Aortic atherosclerosis (ICD10-170.0). 3. Enlarged pulmonary arteries, indicative of pulmonary arterial hypertension.      09/14/2017 PET scan    Stable exam. No evidence of active lymphoma or other acute findings. (Deauville score 1)       INTERVAL HISTORY: Please see below for problem oriented charting. She returns for further follow-up with her husband She feels well There are no changes on the skin lesions on her scalp No recent infection  REVIEW OF SYSTEMS:   Constitutional: Denies fevers, chills or abnormal weight loss Eyes: Denies blurriness of vision Ears, nose, mouth, throat, and face: Denies mucositis or sore throat Respiratory: Denies cough, dyspnea or wheezes Cardiovascular: Denies palpitation, chest discomfort or lower extremity swelling Gastrointestinal:  Denies nausea, heartburn or change in bowel habits Skin: Denies abnormal skin rashes Lymphatics: Denies new lymphadenopathy or easy bruising Neurological:Denies numbness, tingling or new weaknesses Behavioral/Psych: Mood is stable, no new changes  All other systems were reviewed with the patient and are negative.  I have reviewed the past medical history, past surgical history, social history and family history with the patient and they are unchanged from previous note.  ALLERGIES:  has   No Known Allergies.  MEDICATIONS:  Current Outpatient Medications  Medication Sig Dispense Refill  . apixaban (ELIQUIS) 5 MG TABS tablet Take 5 mg by mouth 2 (two) times daily.     . calcium carbonate (TUMS) 500 MG chewable tablet Chew 2 tablets (400 mg of elemental calcium total) by mouth daily. 2 tablet 0  . diltiazem (CARDIZEM CD) 240 MG 24 hr capsule Take 1 capsule (240 mg total) by mouth daily. 30 capsule 5  . diphenhydrAMINE (BENADRYL) 25 mg capsule Take 25 mg by mouth at bedtime as needed for  sleep.    Marland Kitchen HYDROcodone-acetaminophen (NORCO/VICODIN) 5-325 MG tablet Take 1 tablet by mouth every 6 (six) hours as needed. 10 tablet 0  . levothyroxine (SYNTHROID, LEVOTHROID) 100 MCG tablet Take 100 mcg by mouth daily.  9  . lidocaine-prilocaine (EMLA) cream Apply 1 application topically as needed. Apply to port a cath site one hour prior to needle stick 30 g 2  . NON FORMULARY Take by mouth.     No current facility-administered medications for this visit.    Facility-Administered Medications Ordered in Other Visits  Medication Dose Route Frequency Provider Last Rate Last Dose  . ondansetron (ZOFRAN) 8 mg in sodium chloride 0.9 % 50 mL IVPB   Intravenous Once Dajour Pierpoint, MD      . sodium chloride 0.9 % injection 10 mL  10 mL Intravenous PRN Alvy Bimler, Murle Hellstrom, MD   10 mL at 04/05/17 1315    PHYSICAL EXAMINATION: ECOG PERFORMANCE STATUS: 0 - Asymptomatic  Vitals:   09/16/17 1355  BP: (!) 111/94  Pulse: (!) 120  Resp: 18  Temp: 97.9 F (36.6 C)  SpO2: 99%   Filed Weights   09/16/17 1355  Weight: 141 lb 3.2 oz (64 kg)    GENERAL:alert, no distress and comfortable SKIN: palpable skin lesions over the occipital region of her scalp, unchanged EYES: normal, Conjunctiva are pink and non-injected, sclera clear Musculoskeletal:no cyanosis of digits and no clubbing  NEURO: alert & oriented x 3 with fluent speech, no focal motor/sensory deficits  LABORATORY DATA:  I have reviewed the data as listed    Component Value Date/Time   NA 142 08/29/2017 0838   K 4.2 08/29/2017 0838   CL 104 10/26/2016 2215   CO2 23 08/29/2017 0838   GLUCOSE 97 08/29/2017 0838   BUN 24.0 08/29/2017 0838   CREATININE 1.0 08/29/2017 0838   CALCIUM 8.9 08/29/2017 0838   PROT 6.4 08/29/2017 0838   ALBUMIN 3.8 08/29/2017 0838   AST 18 08/29/2017 0838   ALT 16 08/29/2017 0838   ALKPHOS 93 08/29/2017 0838   BILITOT 0.33 08/29/2017 0838   GFRNONAA 35 (L) 10/26/2016 2215   GFRAA 40 (L) 10/26/2016 2215    No  results found for: SPEP, UPEP  Lab Results  Component Value Date   WBC 5.2 08/29/2017   NEUTROABS 3.8 08/29/2017   HGB 12.2 08/29/2017   HCT 38.3 08/29/2017   MCV 91.4 08/29/2017   PLT 245 08/29/2017      Chemistry      Component Value Date/Time   NA 142 08/29/2017 0838   K 4.2 08/29/2017 0838   CL 104 10/26/2016 2215   CO2 23 08/29/2017 0838   BUN 24.0 08/29/2017 0838   CREATININE 1.0 08/29/2017 0838      Component Value Date/Time   CALCIUM 8.9 08/29/2017 0838   ALKPHOS 93 08/29/2017 0838   AST 18 08/29/2017 0838   ALT 16 08/29/2017 0838   BILITOT  0.33 08/29/2017 0838       RADIOGRAPHIC STUDIES: I have personally reviewed the radiological images as listed and agreed with the findings in the report. Nm Pet Image Restag (ps) Skull Base To Thigh  Result Date: 09/14/2017 CLINICAL DATA:  Subsequent treatment strategy for grade 3A follicular lymphoma. EXAM: NUCLEAR MEDICINE PET SKULL BASE TO THIGH TECHNIQUE: 7.3 mCi F-18 FDG was injected intravenously. Full-ring PET imaging was performed from the skull base to thigh after the radiotracer. CT data was obtained and used for attenuation correction and anatomic localization. FASTING BLOOD GLUCOSE:  Value: 76 mg/dl COMPARISON:  CT on 03/24/2017 and PET-CT on 06/15/2016 FINDINGS: NECK:  No hypermetabolic lymph nodes or masses. CHEST: No hypermetabolic masses or lymphadenopathy. No suspicious pulmonary nodules seen on CT images. Stable 4 mm left lower lobe pulmonary nodule shows no FDG uptake. ABDOMEN/PELVIS: No abnormal hypermetabolic activity within the liver, pancreas, adrenal glands, or spleen. No hypermetabolic lymph nodes in the abdomen or pelvis. Mild sigmoid diverticulosis is again seen, without evidence of diverticulitis. Prior hysterectomy. Lumbar spine fusion hardware again noted. SKELETON: No focal hypermetabolic bone lesions to suggest skeletal metastasis. IMPRESSION: Stable exam. No evidence of active lymphoma or other acute  findings. (Deauville score 1) Electronically Signed   By: Earle Gell M.D.   On: 09/14/2017 10:22    ASSESSMENT & PLAN:  Grade 3a follicular lymphoma of lymph nodes of multiple regions ([place]) I have reviewed the PET CT scan in great detail There is no signs of lymphoma recurrence I recommend port removal and she agreed to proceed I will see her back in 6 months for repeat history, physical examination and blood work only  Skin lesions The cause of the skin lesions palpable on the occipital region of the skull is unknown I do not believe this represent cancer recurrence Recommend close observation only and she agreed   Orders Placed This Encounter  Procedures  . IR Removal Tun Access W/ Port W/O FL    Standing Status:   Future    Standing Expiration Date:   11/16/2018    Order Specific Question:   Reason for exam:    Answer:   no need port    Order Specific Question:   Preferred Imaging Location?    Answer:   [facility]   All questions were answered. The patient knows to call the clinic with any problems, questions or concerns. No barriers to learning was detected. I spent 12 minutes counseling the patient face to face. The total time spent in the appointment was 15 minutes and more than 50% was on counseling and review of test results     [name], MD 09/16/2017 2:31 PM

## 2017-09-16 NOTE — Assessment & Plan Note (Signed)
I have reviewed the PET CT scan in great detail There is no signs of lymphoma recurrence I recommend port removal and she agreed to proceed I will see her back in 6 months for repeat history, physical examination and blood work only

## 2017-09-16 NOTE — Assessment & Plan Note (Signed)
The cause of the skin lesions palpable on the occipital region of the skull is unknown I do not believe this represent cancer recurrence Recommend close observation only and she agreed

## 2017-09-16 NOTE — Telephone Encounter (Signed)
Gave avs and calendar for June patient request

## 2017-09-19 ENCOUNTER — Other Ambulatory Visit: Payer: Self-pay | Admitting: Hematology and Oncology

## 2017-09-19 ENCOUNTER — Telehealth: Payer: Self-pay

## 2017-09-19 DIAGNOSIS — C8238 Follicular lymphoma grade IIIa, lymph nodes of multiple sites: Secondary | ICD-10-CM

## 2017-09-19 NOTE — Telephone Encounter (Signed)
Referral called to [facility] for port removal. Faxed referral to [phone].

## 2017-09-23 ENCOUNTER — Ambulatory Visit (HOSPITAL_COMMUNITY): Payer: Medicare HMO

## 2017-10-03 ENCOUNTER — Ambulatory Visit: Payer: Self-pay | Admitting: Surgery

## 2017-10-03 DIAGNOSIS — Z95828 Presence of other vascular implants and grafts: Secondary | ICD-10-CM | POA: Diagnosis not present

## 2017-11-08 DIAGNOSIS — Z452 Encounter for adjustment and management of vascular access device: Secondary | ICD-10-CM | POA: Diagnosis not present

## 2017-11-08 DIAGNOSIS — Z472 Encounter for removal of internal fixation device: Secondary | ICD-10-CM | POA: Diagnosis not present

## 2018-03-24 ENCOUNTER — Telehealth: Payer: Self-pay

## 2018-03-24 ENCOUNTER — Telehealth: Payer: Self-pay | Admitting: Hematology and Oncology

## 2018-03-24 NOTE — Telephone Encounter (Signed)
Spoke to patient regarding upcoming June appointments per 6/7 sch message

## 2018-03-24 NOTE — Telephone Encounter (Signed)
She called and left a message to call her.  Called back. She needs to cancel her appt on 6/11, she will be out of town. Scheduling message sent to schedule  appt the week of 6/17 when she is back in town.

## 2018-03-28 ENCOUNTER — Inpatient Hospital Stay: Payer: Medicare HMO | Admitting: Hematology and Oncology

## 2018-03-28 ENCOUNTER — Inpatient Hospital Stay: Payer: Medicare HMO

## 2018-03-31 ENCOUNTER — Other Ambulatory Visit: Payer: Self-pay | Admitting: Hematology and Oncology

## 2018-03-31 DIAGNOSIS — C8238 Follicular lymphoma grade IIIa, lymph nodes of multiple sites: Secondary | ICD-10-CM

## 2018-04-03 ENCOUNTER — Encounter: Payer: Self-pay | Admitting: Hematology and Oncology

## 2018-04-03 ENCOUNTER — Inpatient Hospital Stay: Payer: Medicare HMO | Attending: Hematology and Oncology

## 2018-04-03 ENCOUNTER — Inpatient Hospital Stay (HOSPITAL_BASED_OUTPATIENT_CLINIC_OR_DEPARTMENT_OTHER): Payer: Medicare HMO | Admitting: Hematology and Oncology

## 2018-04-03 ENCOUNTER — Telehealth: Payer: Self-pay | Admitting: Hematology and Oncology

## 2018-04-03 DIAGNOSIS — Z7901 Long term (current) use of anticoagulants: Secondary | ICD-10-CM | POA: Diagnosis not present

## 2018-04-03 DIAGNOSIS — Z79899 Other long term (current) drug therapy: Secondary | ICD-10-CM

## 2018-04-03 DIAGNOSIS — I482 Chronic atrial fibrillation, unspecified: Secondary | ICD-10-CM

## 2018-04-03 DIAGNOSIS — C8238 Follicular lymphoma grade IIIa, lymph nodes of multiple sites: Secondary | ICD-10-CM | POA: Diagnosis not present

## 2018-04-03 DIAGNOSIS — G8929 Other chronic pain: Secondary | ICD-10-CM | POA: Insufficient documentation

## 2018-04-03 DIAGNOSIS — M549 Dorsalgia, unspecified: Secondary | ICD-10-CM | POA: Insufficient documentation

## 2018-04-03 DIAGNOSIS — N183 Chronic kidney disease, stage 3 unspecified: Secondary | ICD-10-CM

## 2018-04-03 LAB — CBC WITH DIFFERENTIAL/PLATELET
Basophils Absolute: 0 10*3/uL (ref 0.0–0.1)
Basophils Relative: 1 %
Eosinophils Absolute: 0.1 10*3/uL (ref 0.0–0.5)
Eosinophils Relative: 2 %
HCT: 42.4 % (ref 34.8–46.6)
Hemoglobin: 13.9 g/dL (ref 11.6–15.9)
Lymphocytes Relative: 13 %
Lymphs Abs: 0.6 10*3/uL — ABNORMAL LOW (ref 0.9–3.3)
MCH: 30.1 pg (ref 25.1–34.0)
MCHC: 32.9 g/dL (ref 31.5–36.0)
MCV: 91.5 fL (ref 79.5–101.0)
Monocytes Absolute: 0.5 10*3/uL (ref 0.1–0.9)
Monocytes Relative: 10 %
Neutro Abs: 3.7 10*3/uL (ref 1.5–6.5)
Neutrophils Relative %: 74 %
Platelets: 229 10*3/uL (ref 145–400)
RBC: 4.63 MIL/uL (ref 3.70–5.45)
RDW: 14.1 % (ref 11.2–14.5)
WBC: 5 10*3/uL (ref 3.9–10.3)

## 2018-04-03 LAB — COMPREHENSIVE METABOLIC PANEL
ALT: 9 U/L (ref 0–55)
AST: 13 U/L (ref 5–34)
Albumin: 4.3 g/dL (ref 3.5–5.0)
Alkaline Phosphatase: 88 U/L (ref 40–150)
Anion gap: 8 (ref 3–11)
BUN: 21 mg/dL (ref 7–26)
CO2: 28 mmol/L (ref 22–29)
Calcium: 9.8 mg/dL (ref 8.4–10.4)
Chloride: 106 mmol/L (ref 98–109)
Creatinine, Ser: 1.16 mg/dL — ABNORMAL HIGH (ref 0.60–1.10)
GFR calc Af Amer: 52 mL/min — ABNORMAL LOW (ref >60)
GFR calc non Af Amer: 45 mL/min — ABNORMAL LOW (ref >60)
Glucose, Bld: 77 mg/dL (ref 70–140)
Potassium: 4.7 mmol/L (ref 3.5–5.1)
Sodium: 142 mmol/L (ref 136–145)
Total Bilirubin: 0.4 mg/dL (ref 0.2–1.2)
Total Protein: 6.9 g/dL (ref 6.4–8.3)

## 2018-04-03 LAB — LACTATE DEHYDROGENASE: LDH: 178 U/L (ref 125–245)

## 2018-04-03 NOTE — Telephone Encounter (Signed)
Gave patient avs and calendar of upcoming December appointments.  °

## 2018-04-03 NOTE — Progress Notes (Signed)
[place] OFFICE PROGRESS NOTE  Patient Care Team: [name], MD as PCP - General (Internal Medicine) Carola Frost, RN as Registered Nurse (Medical Oncology) [name], MD as Attending Physician (Internal Medicine)  ASSESSMENT & PLAN:  Grade 3a follicular lymphoma of lymph nodes of multiple regions [facility]) Clinically, she has no signs or symptoms of cancer recurrence The palpable spot in her head is likely subcutaneous lipoma There is no benefit for routine imaging study I will see her back in 6 months with repeat history, physical examination and blood work  Chronic atrial fibrillation ([place]) She appears to be in sinus rhythm on exam She will continue medical management and chronic anticoagulation therapy as directed by her cardiologist.  Chronic kidney disease, stage III (moderate) She has chronic kidney disease stage III.  This is likely related to cardiac issues Serum creatinine is stable. Continue medical management   No orders of the defined types were placed in this encounter.   INTERVAL HISTORY: Please see below for problem oriented charting. She returns for further follow-up Her appetite is fair She has occasional hot flashes No new lymphadenopathy She continues to have palpable lumps in the back of her head Denies recent infection Her chronic back pain is stable and she wears a brace intermittently  SUMMARY OF ONCOLOGIC HISTORY:   Grade 3a follicular lymphoma of lymph nodes of multiple regions ([place])   01/21/2015 Surgery    She underwent excisional lymph node biopsy that came back follicular lymphoma grade 3      01/21/2015 Pathology Results    Accession: [id number] biopsy confirmed follicular lymphoma      02/07/2015 Imaging    Echocardiogram showed ejection fraction of 55-60%      02/11/2015 Imaging     PET CT scan show possible splenic involvement and diffuse lymphadenopathy throughout      02/11/2015 Bone Marrow Biopsy      bone marrow biopsy was performed and is involved by lymphoma with translocation of igH/BCL2      02/13/2015 Procedure    She had port placement.      02/17/2015 - 06/02/2015 Chemotherapy    She received R-CHOP chemo x 6      02/17/2015 Adverse Reaction    She had mild infusion reaction with cycle 1 of treatment.      04/18/2015 Imaging    PET CT scan showed near complete response to treatment.      04/22/2015 Adverse Reaction    Vincristine dose was reduced by 50% due to neuropathy from cycle 4 onwards      06/25/2015 - 07/06/2015 Hospital Admission    She was hospitalized for recent sepsis/bacteremia and a fib with RVR      07/11/2015 Imaging    repeat PEt scan showed complete response to Rx      07/14/2015 - 06/16/2017 Chemotherapy    She received maintenance Rituximab every 60 days      12/15/2015 Imaging    PET CT showed no evidence of cancer recurrence      06/15/2016 PET scan    No evidence of active lymphoma on skullbase to thigh FDG PET scan. Small LEFT periaortic retroperitoneal lymph nodes without significant metabolic activity ( Deauville 1). No change from prior      03/24/2017 Imaging    1. Borderline enlarged abdominal retroperitoneal lymph nodes, stable. No new adenopathy in the chest, abdomen or pelvis. 2. Aortic atherosclerosis (ICD10-170.0). 3. Enlarged pulmonary arteries, indicative of pulmonary arterial hypertension.  09/14/2017 PET scan    Stable exam. No evidence of active lymphoma or other acute findings. (Deauville score 1)       REVIEW OF SYSTEMS:   Constitutional: Denies fevers, chills or abnormal weight loss Eyes: Denies blurriness of vision Ears, nose, mouth, throat, and face: Denies mucositis or sore throat Respiratory: Denies cough, dyspnea or wheezes Cardiovascular: Denies palpitation, chest discomfort or lower extremity swelling Gastrointestinal:  Denies nausea, heartburn or change in bowel habits Skin: Denies abnormal skin rashes   Lymphatics: Denies new lymphadenopathy or easy bruising Neurological:Denies numbness, tingling or new weaknesses Behavioral/Psych: Mood is stable, no new changes  All other systems were reviewed with the patient and are negative.  I have reviewed the past medical history, past surgical history, social history and family history with the patient and they are unchanged from previous note.  ALLERGIES:  has No Known Allergies.  MEDICATIONS:  Current Outpatient Medications  Medication Sig Dispense Refill  . apixaban (ELIQUIS) 5 MG TABS tablet Take 5 mg by mouth 2 (two) times daily.     . calcium carbonate (TUMS) 500 MG chewable tablet Chew 2 tablets (400 mg of elemental calcium total) by mouth daily. 2 tablet 0  . diltiazem (CARDIZEM CD) 240 MG 24 hr capsule Take 1 capsule (240 mg total) by mouth daily. 30 capsule 5  . diphenhydrAMINE (BENADRYL) 25 mg capsule Take 25 mg by mouth at bedtime as needed for sleep.    Marland Kitchen levothyroxine (SYNTHROID, LEVOTHROID) 100 MCG tablet Take 100 mcg by mouth daily.  9  . NON FORMULARY Take by mouth.     No current facility-administered medications for this visit.    Facility-Administered Medications Ordered in Other Visits  Medication Dose Route Frequency Provider Last Rate Last Dose  . ondansetron (ZOFRAN) 8 mg in sodium chloride 0.9 % 50 mL IVPB   Intravenous Once Anamarie Hunn, MD      . sodium chloride 0.9 % injection 10 mL  10 mL Intravenous PRN Alvy Bimler, Amelya Mabry, MD   10 mL at 04/05/17 1315    PHYSICAL EXAMINATION: ECOG PERFORMANCE STATUS: 1 - Symptomatic but completely ambulatory  Vitals:   04/03/18 1248  BP: 132/74  Pulse: 69  Resp: 18  Temp: 97.6 F (36.4 C)  SpO2: 100%   Filed Weights   04/03/18 1248  Weight: 141 lb (64 kg)    GENERAL:alert, no distress and comfortable SKIN: skin color, texture, turgor are normal, no rashes or significant lesions.  She has stable palpable subcutaneous nodule in the occipital region, consistent with  lipoma EYES: normal, Conjunctiva are pink and non-injected, sclera clear OROPHARYNX:no exudate, no erythema and lips, buccal mucosa, and tongue normal  NECK: supple, thyroid normal size, non-tender, without nodularity LYMPH:  no palpable lymphadenopathy in the cervical, axillary or inguinal LUNGS: clear to auscultation and percussion with normal breathing effort HEART: regular rate & rhythm and no murmurs and no lower extremity edema ABDOMEN:abdomen soft, non-tender and normal bowel sounds Musculoskeletal:no cyanosis of digits and no clubbing  NEURO: alert & oriented x 3 with fluent speech, no focal motor/sensory deficits  LABORATORY DATA:  I have reviewed the data as listed    Component Value Date/Time   NA 142 04/03/2018 1222   NA 142 08/29/2017 0838   K 4.7 04/03/2018 1222   K 4.2 08/29/2017 0838   CL 106 04/03/2018 1222   CO2 28 04/03/2018 1222   CO2 23 08/29/2017 0838   GLUCOSE 77 04/03/2018 1222   GLUCOSE 97 08/29/2017 6629  BUN 21 04/03/2018 1222   BUN 24.0 08/29/2017 0838   CREATININE 1.16 (H) 04/03/2018 1222   CREATININE 1.0 08/29/2017 0838   CALCIUM 9.8 04/03/2018 1222   CALCIUM 8.9 08/29/2017 0838   PROT 6.9 04/03/2018 1222   PROT 6.4 08/29/2017 0838   ALBUMIN 4.3 04/03/2018 1222   ALBUMIN 3.8 08/29/2017 0838   AST 13 04/03/2018 1222   AST 18 08/29/2017 0838   ALT 9 04/03/2018 1222   ALT 16 08/29/2017 0838   ALKPHOS 88 04/03/2018 1222   ALKPHOS 93 08/29/2017 0838   BILITOT 0.4 04/03/2018 1222   BILITOT 0.33 08/29/2017 0838   GFRNONAA 45 (L) 04/03/2018 1222   GFRAA 52 (L) 04/03/2018 1222    No results found for: SPEP, UPEP  Lab Results  Component Value Date   WBC 5.0 04/03/2018   NEUTROABS 3.7 04/03/2018   HGB 13.9 04/03/2018   HCT 42.4 04/03/2018   MCV 91.5 04/03/2018   PLT 229 04/03/2018      Chemistry      Component Value Date/Time   NA 142 04/03/2018 1222   NA 142 08/29/2017 0838   K 4.7 04/03/2018 1222   K 4.2 08/29/2017 0838   CL 106  04/03/2018 1222   CO2 28 04/03/2018 1222   CO2 23 08/29/2017 0838   BUN 21 04/03/2018 1222   BUN 24.0 08/29/2017 0838   CREATININE 1.16 (H) 04/03/2018 1222   CREATININE 1.0 08/29/2017 0838      Component Value Date/Time   CALCIUM 9.8 04/03/2018 1222   CALCIUM 8.9 08/29/2017 0838   ALKPHOS 88 04/03/2018 1222   ALKPHOS 93 08/29/2017 0838   AST 13 04/03/2018 1222   AST 18 08/29/2017 0838   ALT 9 04/03/2018 1222   ALT 16 08/29/2017 0838   BILITOT 0.4 04/03/2018 1222   BILITOT 0.33 08/29/2017 0838       All questions were answered. The patient knows to call the clinic with any problems, questions or concerns. No barriers to learning was detected.  I spent 15 minutes counseling the patient face to face. The total time spent in the appointment was 20 minutes and more than 50% was on counseling and review of test results  [name], MD 04/03/2018 1:28 PM

## 2018-04-03 NOTE — Assessment & Plan Note (Signed)
She has chronic kidney disease stage III.  This is likely related to cardiac issues Serum creatinine is stable. Continue medical management 

## 2018-04-03 NOTE — Assessment & Plan Note (Signed)
Clinically, she has no signs or symptoms of cancer recurrence The palpable spot in her head is likely subcutaneous lipoma There is no benefit for routine imaging study I will see her back in 6 months with repeat history, physical examination and blood work

## 2018-04-03 NOTE — Assessment & Plan Note (Signed)
She appears to be in sinus rhythm on exam She will continue medical management and chronic anticoagulation therapy as directed by her cardiologist.

## 2018-07-12 ENCOUNTER — Other Ambulatory Visit (HOSPITAL_BASED_OUTPATIENT_CLINIC_OR_DEPARTMENT_OTHER): Payer: Self-pay | Admitting: Family Medicine

## 2018-07-12 DIAGNOSIS — D179 Benign lipomatous neoplasm, unspecified: Secondary | ICD-10-CM

## 2018-07-14 ENCOUNTER — Ambulatory Visit (HOSPITAL_BASED_OUTPATIENT_CLINIC_OR_DEPARTMENT_OTHER)
Admission: RE | Admit: 2018-07-14 | Discharge: 2018-07-14 | Disposition: A | Discharge status: Home / Self Care | Payer: Non-veteran care | Source: Ambulatory Visit | Attending: Family Medicine | Admitting: Family Medicine

## 2018-07-14 DIAGNOSIS — R221 Localized swelling, mass and lump, neck: Secondary | ICD-10-CM | POA: Diagnosis not present

## 2018-07-14 DIAGNOSIS — D179 Benign lipomatous neoplasm, unspecified: Secondary | ICD-10-CM

## 2018-07-27 ENCOUNTER — Telehealth: Payer: Self-pay | Admitting: Hematology and Oncology

## 2018-07-27 ENCOUNTER — Other Ambulatory Visit: Payer: Self-pay | Admitting: Hematology and Oncology

## 2018-07-27 DIAGNOSIS — C8238 Follicular lymphoma grade IIIa, lymph nodes of multiple sites: Secondary | ICD-10-CM

## 2018-07-27 NOTE — Telephone Encounter (Signed)
Received copy of test results.  The patient underwent ultrasound of the chest after palpable lump on the left scapula.  Ultrasound show findings suspicious for recurrence of lymphoma. Plan to order PET CT scan for staging and see her back afterwards to discuss test results.

## 2018-07-31 ENCOUNTER — Telehealth: Payer: Self-pay | Admitting: Hematology and Oncology

## 2018-07-31 NOTE — Telephone Encounter (Signed)
Faxed medical records to [facility], Release ID: [id number]

## 2018-08-01 ENCOUNTER — Emergency Department (HOSPITAL_BASED_OUTPATIENT_CLINIC_OR_DEPARTMENT_OTHER): Payer: Medicare HMO

## 2018-08-01 ENCOUNTER — Other Ambulatory Visit: Payer: Self-pay | Admitting: Hematology and Oncology

## 2018-08-01 ENCOUNTER — Encounter (HOSPITAL_BASED_OUTPATIENT_CLINIC_OR_DEPARTMENT_OTHER): Payer: Self-pay | Admitting: Emergency Medicine

## 2018-08-01 ENCOUNTER — Emergency Department (HOSPITAL_BASED_OUTPATIENT_CLINIC_OR_DEPARTMENT_OTHER)
Admission: EM | Admit: 2018-08-01 | Discharge: 2018-08-01 | Disposition: A | Discharge status: Home / Self Care | Payer: Medicare HMO | Attending: Emergency Medicine | Admitting: Emergency Medicine

## 2018-08-01 ENCOUNTER — Other Ambulatory Visit: Payer: Self-pay

## 2018-08-01 ENCOUNTER — Telehealth: Payer: Self-pay | Admitting: Hematology and Oncology

## 2018-08-01 DIAGNOSIS — E039 Hypothyroidism, unspecified: Secondary | ICD-10-CM | POA: Insufficient documentation

## 2018-08-01 DIAGNOSIS — Z79899 Other long term (current) drug therapy: Secondary | ICD-10-CM | POA: Insufficient documentation

## 2018-08-01 DIAGNOSIS — E1122 Type 2 diabetes mellitus with diabetic chronic kidney disease: Secondary | ICD-10-CM | POA: Insufficient documentation

## 2018-08-01 DIAGNOSIS — M25562 Pain in left knee: Secondary | ICD-10-CM | POA: Diagnosis not present

## 2018-08-01 DIAGNOSIS — Z96651 Presence of right artificial knee joint: Secondary | ICD-10-CM | POA: Insufficient documentation

## 2018-08-01 DIAGNOSIS — R600 Localized edema: Secondary | ICD-10-CM | POA: Diagnosis not present

## 2018-08-01 DIAGNOSIS — I129 Hypertensive chronic kidney disease with stage 1 through stage 4 chronic kidney disease, or unspecified chronic kidney disease: Secondary | ICD-10-CM | POA: Diagnosis not present

## 2018-08-01 DIAGNOSIS — Z7901 Long term (current) use of anticoagulants: Secondary | ICD-10-CM | POA: Diagnosis not present

## 2018-08-01 DIAGNOSIS — N183 Chronic kidney disease, stage 3 (moderate): Secondary | ICD-10-CM | POA: Diagnosis not present

## 2018-08-01 DIAGNOSIS — C8238 Follicular lymphoma grade IIIa, lymph nodes of multiple sites: Secondary | ICD-10-CM

## 2018-08-01 DIAGNOSIS — M1712 Unilateral primary osteoarthritis, left knee: Secondary | ICD-10-CM | POA: Diagnosis not present

## 2018-08-01 MED ORDER — DICLOFENAC SODIUM 1 % TD GEL: 2.0000 g | Freq: Three times a day (TID) | TRANSDERMAL | 0 refills | Status: DC | PRN

## 2018-08-01 NOTE — ED Triage Notes (Signed)
Reports knee pain since Thursday without injury.

## 2018-08-01 NOTE — Telephone Encounter (Signed)
Spoke with patient about appts being added per 10/15 sch msg

## 2018-08-01 NOTE — ED Provider Notes (Signed)
[facility] EMERGENCY DEPARTMENT Provider Note   CSN: [id number] Arrival date & time: 08/01/18  1701     History   Chief Complaint Chief Complaint  Patient presents with  . Knee Pain    HPI Carrie [name] is a 77 y.o. female.  The history is provided by the patient and the spouse. No language interpreter was used.  Knee Pain     Carrie [name] is a 77 y.o. female who presents to the Emergency Department complaining of knee pain. Presents to the emergency department complaining of left knee pain for the last four days. No reports of injuries. She notes that she has experienced gradual onset of generalized left knee pain, greatest over the left aspect. She has progressive edema to the knee as well. She has tried Tylenol and Voltaren gel with minimal improvement in her symptoms. No prior symptoms. She is on eliquis for atrial fibrillation. She denies any fevers, chest pain, shortness of breath. She does have a history of prior surgery to that knee. Past Medical History:  Diagnosis Date  . A-fib ([place])   . Anemia    "years ago"  . Atrial fibrillation with rapid ventricular response ([place]) 06/25/2015  . Cervical spondylosis without myelopathy 10/25/2013  . Dental bridge present   . DJD (degenerative joint disease)   . Elevated troponin 02/18/2015  . Follicular lymphoma grade 3a ([place]) 02/03/2015  . History of echocardiogram    Echo 12/16: EF 60-65%, no RWMA, severe LAE  . History of nuclear stress test    Myoview 1/17: EF 55%, Normal study. No ischemia or scar.  . Hypothyroid   . Nausea without vomiting 06/20/2015  . Pneumonia   . Stage III chronic kidney disease ([place]) 07/01/2015  . Thrush of mouth and esophagus ([place]) 06/25/2015    Patient Active Problem List   Diagnosis Date Noted  . Skin lesions 06/06/2017  . Dyspnea on exertion 03/25/2017  . Anxiety disorder 03/25/2017  . Generalized weakness 03/19/2017  . Insomnia disorder 03/19/2017  . Dysuria 08/31/2016  . Acute  back pain with sciatica, right 08/31/2016  . Chronic back pain 07/02/2016  . Quality of life palliative care encounter 03/02/2016  . Port catheter in place 03/01/2016  . Chronic atrial fibrillation 09/08/2015  . Essential hypertension 07/02/2015  . Diabetes mellitus, likely due to steroids 06/27/2015  . Chronic kidney disease, stage III (moderate) ([place]) 06/25/2015  . Grade 3a follicular lymphoma of lymph nodes of multiple regions ([place]) 02/03/2015  . Hypothyroidism 09/09/2009    Past Surgical History:  Procedure Laterality Date  . ABDOMINAL HYSTERECTOMY    . APPENDECTOMY    . BACK SURGERY     X5-lumbar-fusion  . COLONOSCOPY    . DILATION AND CURETTAGE OF UTERUS    . LYMPH NODE BIOPSY Right 01/21/2015   Procedure: RIGHT GROIN LYMPH NODE BIOPSY;  Surgeon: Erroll Luna, MD;  Location: [facility];  Service: General;  Laterality: Right;  . Ovarian cyst resection    . patelar tendon transplants     Left/right  . PORTACATH PLACEMENT Right 02/13/2015   Procedure: INSERTION PORT-A-CATH WITH ULTRASOUND;  Surgeon: Erroll Luna, MD;  Location: [facility];  Service: General;  Laterality: Right;  . TOTAL KNEE ARTHROPLASTY  2011   Right     OB History   None      Home Medications    Prior to Admission medications   Medication Sig Start Date End Date Taking? Authorizing Provider  apixaban (ELIQUIS) 5  MG TABS tablet Take 5 mg by mouth 2 (two) times daily.     [provider]  calcium carbonate (TUMS) 500 MG chewable tablet Chew 2 tablets (400 mg of elemental calcium total) by mouth daily. 06/21/15   Volanda Napoleon, MD  diclofenac sodium (VOLTAREN) 1 % GEL Apply 2 g topically 3 (three) times daily as needed (knee pain). 08/01/18   Quintella Reichert, MD  diltiazem (CARDIZEM CD) 240 MG 24 hr capsule Take 1 capsule (240 mg total) by mouth daily. 08/05/15   [name] T, PA-C  diphenhydrAMINE (BENADRYL) 25 mg capsule Take 25 mg by mouth at bedtime as needed for sleep.     [provider]  levothyroxine (SYNTHROID, LEVOTHROID) 100 MCG tablet Take 100 mcg by mouth daily. 04/22/15   [provider]  NON FORMULARY Take by mouth.    [provider]    Family History Family History  Problem Relation Age of Onset  . Cancer Mother        Breast, lung NHL  . Cancer Sister        Multiple myeloma    Social History Social History   Tobacco Use  . Smoking status: Never Smoker  . Smokeless tobacco: Never Used  Substance Use Topics  . Alcohol use: Yes    Comment: occassional wine  . Drug use: No     Allergies   Patient has no known allergies.   Review of Systems Review of Systems  All other systems reviewed and are negative.    Physical Exam Updated Vital Signs BP (!) 156/96   Pulse 89   Temp 97.6 F (36.4 C) (Oral)   Resp 16   Ht '5\' 1"'  (1.549 m)   Wt 63 kg   SpO2 97%   BMI 26.26 kg/m   Physical Exam  Constitutional: She is oriented to person, place, and time. She appears well-developed and well-nourished.  HENT:  Head: Normocephalic and atraumatic.  Cardiovascular: Normal rate.  Pulmonary/Chest: Effort normal. No respiratory distress.  Musculoskeletal:  2+ DP pulses bilaterally. There is mild generalized soft tissue edema throughout the left knee. There is no erythema to the knee. There is mild generalized tenderness throughout the knee. Flexion extension is intact at the knee. There is an irregular surgical scar over the anterior medial knee as well as the anterior tibia.  Neurological: She is alert and oriented to person, place, and time.  Skin: Skin is warm and dry. Capillary refill takes less than 2 seconds.  Psychiatric: She has a normal mood and affect. Her behavior is normal.  Nursing note and vitals reviewed.    ED Treatments / Results  Labs (all labs ordered are listed, but only abnormal results are displayed) Labs Reviewed - No data to display  EKG None  Radiology Dg Knee Complete 4 Views  Left  Result Date: 08/01/2018 CLINICAL DATA:  Severe left knee pain for 4 days. History of patellar tendon transplant bilaterally. EXAM: LEFT KNEE - COMPLETE 4+ VIEW COMPARISON:  07/01/2015 FINDINGS: Tricompartmental osteoarthritis with moderate jo femorotibial and marked patellofemoral joint space narrowing with spurring is identified. No joint effusion, fracture, loose body or bone destruction. Evidence of prior tibial tuberosity transfer. Slight thickened appearance along the course of the patellar tendon may represent tendinosis. IMPRESSION: Tricompartmental osteoarthritis. No acute osseous abnormality. Thickened appearance of the patellar tendon may reflect a tendinosis. Electronically Signed   By: Ashley Royalty M.D.   On: 08/01/2018 18:50    Procedures Procedures (including  critical care time)  Medications Ordered in ED Medications - No data to display   Initial Impression / Assessment and Plan / ED Course  I have reviewed the triage vital signs and the nursing notes.  Pertinent labs & imaging results that were available during my care of the patient were reviewed by me and considered in my medical decision making (see chart for details).     Patient here for evaluation of atraumatic left knee pain. She is neurovascular intact on examination with range of motion preserved in the knee. There is no evidence of acute fracture, dislocation, gouty or septic arthritis. Discussed with patient home care for atraumatic knee pain. Discussed outpatient orthopedics follow-up as well as return precautions.  Final Clinical Impressions(s) / ED Diagnoses   Final diagnoses:  Acute pain of left knee    ED Discharge Orders         Ordered    diclofenac sodium (VOLTAREN) 1 % GEL  3 times daily PRN     08/01/18 1907           Quintella Reichert, MD 08/01/18 1911

## 2018-08-08 ENCOUNTER — Ambulatory Visit (HOSPITAL_COMMUNITY)
Admission: RE | Admit: 2018-08-08 | Discharge: 2018-08-08 | Disposition: A | Discharge status: Home / Self Care | Payer: Medicare HMO | Source: Ambulatory Visit | Attending: Hematology and Oncology | Admitting: Hematology and Oncology

## 2018-08-08 ENCOUNTER — Inpatient Hospital Stay: Payer: Non-veteran care | Attending: Hematology and Oncology

## 2018-08-08 DIAGNOSIS — I700 Atherosclerosis of aorta: Secondary | ICD-10-CM | POA: Diagnosis not present

## 2018-08-08 DIAGNOSIS — Z79899 Other long term (current) drug therapy: Secondary | ICD-10-CM | POA: Diagnosis not present

## 2018-08-08 DIAGNOSIS — I482 Chronic atrial fibrillation, unspecified: Secondary | ICD-10-CM | POA: Diagnosis not present

## 2018-08-08 DIAGNOSIS — R911 Solitary pulmonary nodule: Secondary | ICD-10-CM | POA: Diagnosis not present

## 2018-08-08 DIAGNOSIS — Z9221 Personal history of antineoplastic chemotherapy: Secondary | ICD-10-CM | POA: Diagnosis not present

## 2018-08-08 DIAGNOSIS — Z7901 Long term (current) use of anticoagulants: Secondary | ICD-10-CM | POA: Diagnosis not present

## 2018-08-08 DIAGNOSIS — N183 Chronic kidney disease, stage 3 (moderate): Secondary | ICD-10-CM | POA: Diagnosis not present

## 2018-08-08 DIAGNOSIS — C8238 Follicular lymphoma grade IIIa, lymph nodes of multiple sites: Secondary | ICD-10-CM | POA: Insufficient documentation

## 2018-08-08 DIAGNOSIS — C8200 Follicular lymphoma grade I, unspecified site: Secondary | ICD-10-CM | POA: Diagnosis not present

## 2018-08-08 DIAGNOSIS — D72829 Elevated white blood cell count, unspecified: Secondary | ICD-10-CM | POA: Diagnosis not present

## 2018-08-08 LAB — COMPREHENSIVE METABOLIC PANEL
ALT: 13 U/L (ref 0–44)
AST: 15 U/L (ref 15–41)
Albumin: 4.1 g/dL (ref 3.5–5.0)
Alkaline Phosphatase: 89 U/L (ref 38–126)
Anion gap: 11 (ref 5–15)
BUN: 24 mg/dL — ABNORMAL HIGH (ref 8–23)
CO2: 26 mmol/L (ref 22–32)
Calcium: 9.5 mg/dL (ref 8.9–10.3)
Chloride: 108 mmol/L (ref 98–111)
Creatinine, Ser: 1.18 mg/dL — ABNORMAL HIGH (ref 0.44–1.00)
GFR calc Af Amer: 51 mL/min — ABNORMAL LOW (ref >60)
GFR calc non Af Amer: 44 mL/min — ABNORMAL LOW (ref >60)
Glucose, Bld: 102 mg/dL — ABNORMAL HIGH (ref 70–99)
Potassium: 4.4 mmol/L (ref 3.5–5.1)
Sodium: 145 mmol/L (ref 135–145)
Total Bilirubin: 0.5 mg/dL (ref 0.3–1.2)
Total Protein: 6.7 g/dL (ref 6.5–8.1)

## 2018-08-08 LAB — CBC WITH DIFFERENTIAL/PLATELET
Abs Immature Granulocytes: 0.01 10*3/uL (ref 0.00–0.07)
Basophils Absolute: 0 10*3/uL (ref 0.0–0.1)
Basophils Relative: 1 %
Eosinophils Absolute: 0.2 10*3/uL (ref 0.0–0.5)
Eosinophils Relative: 4 %
HCT: 42.1 % (ref 36.0–46.0)
Hemoglobin: 13.5 g/dL (ref 12.0–15.0)
Immature Granulocytes: 0 %
Lymphocytes Relative: 18 %
Lymphs Abs: 0.9 10*3/uL (ref 0.7–4.0)
MCH: 30.5 pg (ref 26.0–34.0)
MCHC: 32.1 g/dL (ref 30.0–36.0)
MCV: 95 fL (ref 80.0–100.0)
Monocytes Absolute: 0.6 10*3/uL (ref 0.1–1.0)
Monocytes Relative: 12 %
Neutro Abs: 3 10*3/uL (ref 1.7–7.7)
Neutrophils Relative %: 65 %
Platelets: 215 10*3/uL (ref 150–400)
RBC: 4.43 MIL/uL (ref 3.87–5.11)
RDW: 13 % (ref 11.5–15.5)
WBC: 4.6 10*3/uL (ref 4.0–10.5)
nRBC: 0 % (ref 0.0–0.2)

## 2018-08-08 LAB — GLUCOSE, CAPILLARY: Glucose-Capillary: 103 mg/dL — ABNORMAL HIGH (ref 70–99)

## 2018-08-08 LAB — URIC ACID: Uric Acid, Serum: 5.9 mg/dL (ref 2.5–7.1)

## 2018-08-08 LAB — LACTATE DEHYDROGENASE: LDH: 205 U/L — ABNORMAL HIGH (ref 98–192)

## 2018-08-08 MED ORDER — FLUDEOXYGLUCOSE F - 18 (FDG) INJECTION
6.8000 | Freq: Once | INTRAVENOUS | Status: AC
  Administered 2018-08-08: 6.8 via INTRAVENOUS

## 2018-08-09 ENCOUNTER — Telehealth: Payer: Self-pay | Admitting: *Deleted

## 2018-08-09 ENCOUNTER — Encounter: Payer: Self-pay | Admitting: Hematology and Oncology

## 2018-08-09 ENCOUNTER — Inpatient Hospital Stay (HOSPITAL_BASED_OUTPATIENT_CLINIC_OR_DEPARTMENT_OTHER): Payer: Non-veteran care | Admitting: Hematology and Oncology

## 2018-08-09 ENCOUNTER — Telehealth: Payer: Self-pay | Admitting: Hematology and Oncology

## 2018-08-09 VITALS — BP 147/91 | HR 81 | Temp 97.5°F | Resp 18 | Ht 60.0 in | Wt 143.2 lb

## 2018-08-09 DIAGNOSIS — Z7901 Long term (current) use of anticoagulants: Secondary | ICD-10-CM

## 2018-08-09 DIAGNOSIS — Z79899 Other long term (current) drug therapy: Secondary | ICD-10-CM

## 2018-08-09 DIAGNOSIS — Z9221 Personal history of antineoplastic chemotherapy: Secondary | ICD-10-CM

## 2018-08-09 DIAGNOSIS — N183 Chronic kidney disease, stage 3 (moderate): Secondary | ICD-10-CM | POA: Diagnosis not present

## 2018-08-09 DIAGNOSIS — I700 Atherosclerosis of aorta: Secondary | ICD-10-CM

## 2018-08-09 DIAGNOSIS — I482 Chronic atrial fibrillation, unspecified: Secondary | ICD-10-CM

## 2018-08-09 DIAGNOSIS — C8238 Follicular lymphoma grade IIIa, lymph nodes of multiple sites: Secondary | ICD-10-CM | POA: Diagnosis not present

## 2018-08-09 LAB — HEPATITIS B SURFACE ANTIBODY,QUALITATIVE: Hep B S Ab: NONREACTIVE

## 2018-08-09 LAB — HEPATITIS B CORE ANTIBODY, IGM: Hep B C IgM: NEGATIVE

## 2018-08-09 LAB — HEPATITIS B SURFACE ANTIGEN: Hepatitis B Surface Ag: NEGATIVE

## 2018-08-09 NOTE — Progress Notes (Signed)
[facility] OFFICE PROGRESS NOTE  Patient Care Team: [name], MD as PCP - General (Internal Medicine) Carola Frost, RN as Registered Nurse (Medical Oncology) [name], MD as Attending Physician (Internal Medicine)  ASSESSMENT & PLAN:  Grade 3a follicular lymphoma of lymph nodes of multiple regions [facility]) I have reviewed multiple imaging studies with the patient and family The patient has cutaneous lesions/signs of recurrence of lymphoma in multiple places The area behind the left scapula has the highest SUV uptake and appears to be the largest I recommend excisional biopsy of the mass along with port placement through her surgeon for further evaluation Due to lymphoma diagnosis, fine-needle aspirate and biopsy is not adequate She had high-grade follicular lymphoma in the past If she has recurrence of follicular lymphoma again, systemic chemotherapy plus minus immunotherapy would be treatment of choice However, if she has transformation to high-grade lymphoma, she might need high-dose chemotherapy followed by stem cell transplant The patient is instructed to call me as soon as she has appointment made for her surgery In the meantime, she does not appear to be symptomatic  Chronic atrial fibrillation ([place]) She is in sinus rhythm.  She has no signs or symptoms of congestive heart failure I recommend her to call her cardiologist for cardiac clearance and consideration to hold Eliquis perioperatively   Orders Placed This Encounter  Procedures  . Ambulatory referral to General Surgery    Referral Priority:   Routine    Referral Type:   Surgical    Referral Reason:   Specialty Services Required    Referred to Provider:   Erroll Luna, MD    Requested Specialty:   General Surgery    Number of Visits Requested:   1    INTERVAL HISTORY: Please see below for problem oriented charting. She returns with her husband for further follow-up and review of  test results She started to feel subcutaneous nodule around her left scapula starting around September It does not cause pain She denies other new lymphadenopathy No recent anorexia, weight loss or night sweats  SUMMARY OF ONCOLOGIC HISTORY:   Grade 3a follicular lymphoma of lymph nodes of multiple regions ([place])   01/21/2015 Surgery    She underwent excisional lymph node biopsy that came back follicular lymphoma grade 3    01/21/2015 Pathology Results    Accession: [id number] biopsy confirmed follicular lymphoma    04/25/6282 Imaging    Echocardiogram showed ejection fraction of 55-60%    02/11/2015 Imaging     PET CT scan show possible splenic involvement and diffuse lymphadenopathy throughout    02/11/2015 Bone Marrow Biopsy     bone marrow biopsy was performed and is involved by lymphoma with translocation of igH/BCL2    02/13/2015 Procedure    She had port placement.    02/17/2015 - 06/02/2015 Chemotherapy    She received R-CHOP chemo x 6    02/17/2015 Adverse Reaction    She had mild infusion reaction with cycle 1 of treatment.    04/18/2015 Imaging    PET CT scan showed near complete response to treatment.    04/22/2015 Adverse Reaction    Vincristine dose was reduced by 50% due to neuropathy from cycle 4 onwards    06/25/2015 - 07/06/2015 Hospital Admission    She was hospitalized for recent sepsis/bacteremia and a fib with RVR    07/11/2015 Imaging    repeat PEt scan showed complete response to Rx    07/14/2015 - 06/16/2017 Chemotherapy  She received maintenance Rituximab every 60 days    12/15/2015 Imaging    PET CT showed no evidence of cancer recurrence    06/15/2016 PET scan    No evidence of active lymphoma on skullbase to thigh FDG PET scan. Small LEFT periaortic retroperitoneal lymph nodes without significant metabolic activity ( Deauville 1). No change from prior    03/24/2017 Imaging    1. Borderline enlarged abdominal retroperitoneal lymph nodes, stable. No new  adenopathy in the chest, abdomen or pelvis. 2. Aortic atherosclerosis (ICD10-170.0). 3. Enlarged pulmonary arteries, indicative of pulmonary arterial hypertension.    09/14/2017 PET scan    Stable exam. No evidence of active lymphoma or other acute findings. (Deauville score 1)    08/08/2018 PET scan    1. Evidence of progressive/recurrent disease, as evidenced by new hypermetabolic nodes and subcutaneous nodule/nodes throughout left chest and abdomen. (Deauville 5). 2. Likely physiologic right nasopharyngeal hypermetabolism. Recommend attention on follow-up. 3. Incidental findings, including pulmonary artery enlargement, suggesting pulmonary arterial hypertension, stable left lower lobe pulmonary nodule, and aortic Atherosclerosis (ICD10-I70.0).     REVIEW OF SYSTEMS:   Constitutional: Denies fevers, chills or abnormal weight loss Eyes: Denies blurriness of vision Ears, nose, mouth, throat, and face: Denies mucositis or sore throat Respiratory: Denies cough, dyspnea or wheezes Cardiovascular: Denies palpitation, chest discomfort or lower extremity swelling Gastrointestinal:  Denies nausea, heartburn or change in bowel habits Neurological:Denies numbness, tingling or new weaknesses Behavioral/Psych: Mood is stable, no new changes  All other systems were reviewed with the patient and are negative.  I have reviewed the past medical history, past surgical history, social history and family history with the patient and they are unchanged from previous note.  ALLERGIES:  has No Known Allergies.  MEDICATIONS:  Current Outpatient Medications  Medication Sig Dispense Refill  . apixaban (ELIQUIS) 5 MG TABS tablet Take 5 mg by mouth 2 (two) times daily.     . calcium carbonate (TUMS) 500 MG chewable tablet Chew 2 tablets (400 mg of elemental calcium total) by mouth daily. 2 tablet 0  . diclofenac sodium (VOLTAREN) 1 % GEL Apply 2 g topically 3 (three) times daily as needed (knee pain). 100  g 0  . diltiazem (CARDIZEM CD) 240 MG 24 hr capsule Take 1 capsule (240 mg total) by mouth daily. 30 capsule 5  . diphenhydrAMINE (BENADRYL) 25 mg capsule Take 25 mg by mouth at bedtime as needed for sleep.    Marland Kitchen levothyroxine (SYNTHROID, LEVOTHROID) 100 MCG tablet Take 100 mcg by mouth daily.  9  . NON FORMULARY Take by mouth.     No current facility-administered medications for this visit.    Facility-Administered Medications Ordered in Other Visits  Medication Dose Route Frequency Provider Last Rate Last Dose  . ondansetron (ZOFRAN) 8 mg in sodium chloride 0.9 % 50 mL IVPB   Intravenous Once Ehan Freas, MD      . sodium chloride 0.9 % injection 10 mL  10 mL Intravenous PRN Alvy Bimler, Latica Hohmann, MD   10 mL at 04/05/17 1315    PHYSICAL EXAMINATION: ECOG PERFORMANCE STATUS: 1 - Symptomatic but completely ambulatory  Vitals:   08/09/18 1023  BP: (!) 147/91  Pulse: 81  Resp: 18  Temp: (!) 97.5 F (36.4 C)  SpO2: 100%   Filed Weights   08/09/18 1023  Weight: 143 lb 3.2 oz (65 kg)    GENERAL:alert, no distress and comfortable SKIN: She has palpable subcutaneous nodules EYES: normal, Conjunctiva are pink and non-injected,  sclera clear OROPHARYNX:no exudate, no erythema and lips, buccal mucosa, and tongue normal  NECK: supple, thyroid normal size, non-tender, without nodularity LYMPH: Palpable lymphadenopathy noted on the left axilla LUNGS: clear to auscultation and percussion with normal breathing effort HEART: regular rate & rhythm and no murmurs and no lower extremity edema ABDOMEN:abdomen soft, non-tender and normal bowel sounds Musculoskeletal:no cyanosis of digits and no clubbing  NEURO: alert & oriented x 3 with fluent speech, no focal motor/sensory deficits  LABORATORY DATA:  I have reviewed the data as listed    Component Value Date/Time   NA 145 08/08/2018 0805   NA 142 08/29/2017 0838   K 4.4 08/08/2018 0805   K 4.2 08/29/2017 0838   CL 108 08/08/2018 0805   CO2 26  08/08/2018 0805   CO2 23 08/29/2017 0838   GLUCOSE 102 (H) 08/08/2018 0805   GLUCOSE 97 08/29/2017 0838   BUN 24 (H) 08/08/2018 0805   BUN 24.0 08/29/2017 0838   CREATININE 1.18 (H) 08/08/2018 0805   CREATININE 1.0 08/29/2017 0838   CALCIUM 9.5 08/08/2018 0805   CALCIUM 8.9 08/29/2017 0838   PROT 6.7 08/08/2018 0805   PROT 6.4 08/29/2017 0838   ALBUMIN 4.1 08/08/2018 0805   ALBUMIN 3.8 08/29/2017 0838   AST 15 08/08/2018 0805   AST 18 08/29/2017 0838   ALT 13 08/08/2018 0805   ALT 16 08/29/2017 0838   ALKPHOS 89 08/08/2018 0805   ALKPHOS 93 08/29/2017 0838   BILITOT 0.5 08/08/2018 0805   BILITOT 0.33 08/29/2017 0838   GFRNONAA 44 (L) 08/08/2018 0805   GFRAA 51 (L) 08/08/2018 0805    No results found for: SPEP, UPEP  Lab Results  Component Value Date   WBC 4.6 08/08/2018   NEUTROABS 3.0 08/08/2018   HGB 13.5 08/08/2018   HCT 42.1 08/08/2018   MCV 95.0 08/08/2018   PLT 215 08/08/2018      Chemistry      Component Value Date/Time   NA 145 08/08/2018 0805   NA 142 08/29/2017 0838   K 4.4 08/08/2018 0805   K 4.2 08/29/2017 0838   CL 108 08/08/2018 0805   CO2 26 08/08/2018 0805   CO2 23 08/29/2017 0838   BUN 24 (H) 08/08/2018 0805   BUN 24.0 08/29/2017 0838   CREATININE 1.18 (H) 08/08/2018 0805   CREATININE 1.0 08/29/2017 0838      Component Value Date/Time   CALCIUM 9.5 08/08/2018 0805   CALCIUM 8.9 08/29/2017 0838   ALKPHOS 89 08/08/2018 0805   ALKPHOS 93 08/29/2017 0838   AST 15 08/08/2018 0805   AST 18 08/29/2017 0838   ALT 13 08/08/2018 0805   ALT 16 08/29/2017 0838   BILITOT 0.5 08/08/2018 0805   BILITOT 0.33 08/29/2017 0838       RADIOGRAPHIC STUDIES: I have reviewed imaging study with patient and family I have personally reviewed the radiological images as listed and agreed with the findings in the report. Nm Pet Image Restag (ps) Skull Base To Thigh  Result Date: 08/08/2018 CLINICAL DATA:  Subsequent treatment strategy for restaging of  follicular lymphoma. EXAM: NUCLEAR MEDICINE PET SKULL BASE TO THIGH TECHNIQUE: 6.8 mCi F-18 FDG was injected intravenously. Full-ring PET imaging was performed from the skull base to thigh after the radiotracer. CT data was obtained and used for attenuation correction and anatomic localization. Fasting blood glucose: 103 mg/dl COMPARISON:  09/14/2017 FINDINGS: Mediastinal blood pool activity: SUV max 2.1 NECK: There is hypermetabolic "brown" fat and muscular activity  within the neck and upper chest. Given this factor, no cervical nodal hypermetabolism identified. There is right nasopharyngeal hypermetabolism without CT correlate, measuring a S.U.V. max of 5.8 on image 12/4. The thyroid gland demonstrates diffuse mild hypermetabolism, similar. Incidental CT findings: No cervical adenopathy. CHEST: Development of hypermetabolic left axillary lymph nodes. These measure maximally 11 mm and a S.U.V. max of 6.7 on image 49/4. A new node superficial to the left scapula measures 8 mm and a S.U.V. max of 4.8 on image 54/4. Subcutaneous nodules, likely nodes, superficial the right supraspinatus muscle measure maximally 10 mm and a S.U.V. max of 8.7 on image 61/4. Incidental CT findings: Aortic atherosclerosis. Mild cardiomegaly. Pulmonary artery enlargement, outflow tract 3.5 cm. A 4 mm left lower lobe pulmonary nodule is unchanged on image 72/4. ABDOMEN/PELVIS: Hypermetabolism corresponding to soft tissue thickening just deep to the left lateral eleventh rib. Adjacent subcutaneous hypermetabolic nodule which measures 10 mm and a S.U.V. max of 3.6 on image 107/4. Incidental CT findings: Normal adrenal glands. Abdominal aortic atherosclerosis. Beam hardening artifact from lumbar spine fixation. Small retroperitoneal nodes are grossly similar. Hysterectomy versus uterine atrophy. Pelvic floor laxity. SKELETON: No abnormal marrow activity. Incidental CT findings: Degenerative changes of the right hip. IMPRESSION: 1. Evidence of  progressive/recurrent disease, as evidenced by new hypermetabolic nodes and subcutaneous nodule/nodes throughout left chest and abdomen. (Deauville 5). 2. Likely physiologic right nasopharyngeal hypermetabolism. Recommend attention on follow-up. 3. Incidental findings, including pulmonary artery enlargement, suggesting pulmonary arterial hypertension, stable left lower lobe pulmonary nodule, and aortic Atherosclerosis (ICD10-I70.0). Electronically Signed   By: Abigail Miyamoto M.D.   On: 08/08/2018 13:43   Dg Knee Complete 4 Views Left  Result Date: 08/01/2018 CLINICAL DATA:  Severe left knee pain for 4 days. History of patellar tendon transplant bilaterally. EXAM: LEFT KNEE - COMPLETE 4+ VIEW COMPARISON:  07/01/2015 FINDINGS: Tricompartmental osteoarthritis with moderate jo femorotibial and marked patellofemoral joint space narrowing with spurring is identified. No joint effusion, fracture, loose body or bone destruction. Evidence of prior tibial tuberosity transfer. Slight thickened appearance along the course of the patellar tendon may represent tendinosis. IMPRESSION: Tricompartmental osteoarthritis. No acute osseous abnormality. Thickened appearance of the patellar tendon may reflect a tendinosis. Electronically Signed   By: Ashley Royalty M.D.   On: 08/01/2018 18:50   [place] Chest Soft Tissue  Result Date: 07/14/2018 CLINICAL DATA:  History of non-Hodgkin's lymphoma. The patient has a lump on the LEFT scapula which is enlarging over last month. Mass is associated with mild tenderness. EXAM: ULTRASOUND OF HEAD/NECK SOFT TISSUES TECHNIQUE: Ultrasound examination of the head and neck soft tissues was performed in the area of clinical concern. COMPARISON:  CT of the chest on 03/24/2017 FINDINGS: Targeted ultrasound is performed in the area concern to the patient. Within this region there are numerous small hypoechoic masses within the superficial posterolateral chest, superficial to the scapula. The largest of these  appears bilobed and measures 2.1 x 0.9 x 1.7 centimeters. There is associated internal blood flow on Doppler evaluation. At least 5 additional similar masses are identified, although close proximity, many which are in a linear distribution. IMPRESSION: Palpable abnormality in LEFT posterior chest corresponds to a chain of similar-appearing solid nodules. Findings are suspicious for adenopathy in the setting of non-Hodgkin's lymphoma. Other metastatic disease could have a similar appearance. Consider CT of the chest for further evaluation. Electronically Signed   By: Nolon Nations M.D.   On: 07/14/2018 15:31    All questions were answered. The patient  knows to call the clinic with any problems, questions or concerns. No barriers to learning was detected.  I spent 25 minutes counseling the patient face to face. The total time spent in the appointment was 30 minutes and more than 50% was on counseling and review of test results  [name], MD 08/09/2018 10:54 AM

## 2018-08-09 NOTE — Telephone Encounter (Signed)
Per 10/23 los, return for no new orders.

## 2018-08-09 NOTE — Assessment & Plan Note (Signed)
I have reviewed multiple imaging studies with the patient and family The patient has cutaneous lesions/signs of recurrence of lymphoma in multiple places The area behind the left scapula has the highest SUV uptake and appears to be the largest I recommend excisional biopsy of the mass along with port placement through her surgeon for further evaluation Due to lymphoma diagnosis, fine-needle aspirate and biopsy is not adequate She had high-grade follicular lymphoma in the past If she has recurrence of follicular lymphoma again, systemic chemotherapy plus minus immunotherapy would be treatment of choice However, if she has transformation to high-grade lymphoma, she might need high-dose chemotherapy followed by stem cell transplant The patient is instructed to call me as soon as she has appointment made for her surgery In the meantime, she does not appear to be symptomatic

## 2018-08-09 NOTE — Telephone Encounter (Signed)
-----   Message from [name], MD sent at 08/09/2018 10:56 AM EDT ----- Regarding: general surgery referral She was seen by Dr. Donella Stade before I need surgical excision of scapula mass and port placement. Notes are signed. I need to talk to him directly Please give him my cell phone #

## 2018-08-09 NOTE — Telephone Encounter (Signed)
Spoke with Triage nurse at [facility], notified of message below and they are going to have Dr Brantley Stage call Dr Alvy Bimler

## 2018-08-09 NOTE — Telephone Encounter (Signed)
Carrie [name] called requesting records be faxed to Dr Marjo Bicker at the [facility].  Faxed to [phone].   She is also requesting a letter for herself stating what Dr Alvy Bimler did today.   Ex: ordered PET prior to appt and reviewed PET, ordered biopsy, ordered PAC, treatment detail to follow.Marland KitchenMarland Kitchen

## 2018-08-09 NOTE — Assessment & Plan Note (Signed)
She is in sinus rhythm.  She has no signs or symptoms of congestive heart failure I recommend her to call her cardiologist for cardiac clearance and consideration to hold Eliquis perioperatively

## 2018-08-10 ENCOUNTER — Telehealth: Payer: Self-pay

## 2018-08-10 NOTE — Telephone Encounter (Signed)
Everything is in the notes OK to fax to [place] She can have a copy of my notes.

## 2018-08-10 NOTE — Telephone Encounter (Signed)
Faxed office notes to Dr. Marjo Bicker at [phone].  Called Mrs. Carrie [name]. She will come today and pick up office visit notes.

## 2018-08-10 NOTE — Telephone Encounter (Signed)
She called and left a message. She has a appt on 11/4 at 1115 with Dr. Brantley Stage.

## 2018-08-14 LAB — FLOW CYTOMETRY

## 2018-08-15 ENCOUNTER — Telehealth: Payer: Self-pay

## 2018-08-15 NOTE — Telephone Encounter (Signed)
She called and left a message to call her. Called back and left a message for her to call the office. 

## 2018-08-16 ENCOUNTER — Telehealth: Payer: Self-pay

## 2018-08-16 NOTE — Telephone Encounter (Signed)
She called and requested a letter sent to K. Sharlett Iles, [place] at fax # [phone]. Letter to state that she had PET scan on 10/22 and was seen by Dr. Alvy Bimler on 10/23 to get results.  Letter faxed to above number.

## 2018-08-21 ENCOUNTER — Ambulatory Visit: Payer: Self-pay | Admitting: Surgery

## 2018-08-21 DIAGNOSIS — C8590 Non-Hodgkin lymphoma, unspecified, unspecified site: Secondary | ICD-10-CM | POA: Diagnosis not present

## 2018-08-21 NOTE — H&P (Signed)
Carrie [name] Documented: 08/21/2018 11:34 AM Location: [facility] Patient #: [id number] DOB: [birth year] Widowed / Language: Carrie [name] / Race: White Female  History of Present Illness Carrie Moores A. Dodge Ator MD; 08/21/2018 12:06 PM) Patient words: Patient returns for follow-up after initial treatment of follicular lymphoma in 3532. She was found to have a mass over her left scapula that began to grow. This prompted a PET scan which showed recurrence of what appears to be intra-abdominal and thoracic lymphoma as well as subcutaneous nodules overlying the left scapula. She is sent today for excision of this left scapular mass and placement of port for what appears to be recurrent lymphoma.       Subsequent treatment strategy for restaging of follicular lymphoma.  EXAM: NUCLEAR MEDICINE PET SKULL BASE TO THIGH  TECHNIQUE: 6.8 mCi F-18 FDG was injected intravenously. Full-ring PET imaging was performed from the skull base to thigh after the radiotracer. CT data was obtained and used for attenuation correction and anatomic localization.  Fasting blood glucose: 103 mg/dl  COMPARISON: 09/14/2017  FINDINGS: Mediastinal blood pool activity: SUV max 2.1  NECK: There is hypermetabolic "[name]" fat and muscular activity within the neck and upper chest. Given this factor, no cervical nodal hypermetabolism identified. There is right nasopharyngeal hypermetabolism without CT correlate, measuring a S.U.V. max of 5.8 on image 12/4. The thyroid gland demonstrates diffuse mild hypermetabolism, similar.  Incidental CT findings: No cervical adenopathy.  CHEST: Development of hypermetabolic left axillary lymph nodes. These measure maximally 11 mm and a S.U.V. max of 6.7 on image 49/4.  A new node superficial to the left scapula measures 8 mm and a S.U.V. max of 4.8 on image 54/4.  Subcutaneous nodules, likely nodes, superficial the right supraspinatus muscle measure maximally 10 mm  and a S.U.V. max of 8.7 on image 61/4.  Incidental CT findings: Aortic atherosclerosis. Mild cardiomegaly. Pulmonary artery enlargement, outflow tract 3.5 cm. A 4 mm left lower lobe pulmonary nodule is unchanged on image 72/4.  ABDOMEN/PELVIS: Hypermetabolism corresponding to soft tissue thickening just deep to the left lateral eleventh rib. Adjacent subcutaneous hypermetabolic nodule which measures 10 mm and a S.U.V. max of 3.6 on image 107/4.  Incidental CT findings: Normal adrenal glands. Abdominal aortic atherosclerosis. Beam hardening artifact from lumbar spine fixation. Small retroperitoneal nodes are grossly similar. Hysterectomy versus uterine atrophy. Pelvic floor laxity.  SKELETON: No abnormal marrow activity.  Incidental CT findings: Degenerative changes of the right hip.  IMPRESSION: 1. Evidence of progressive/recurrent disease, as evidenced by new hypermetabolic nodes and subcutaneous nodule/nodes throughout left chest and abdomen. (Deauville 5). 2. Likely physiologic right nasopharyngeal hypermetabolism. Recommend attention on follow-up. 3. Incidental findings, including pulmonary artery enlargement, suggesting pulmonary arterial hypertension, stable left lower lobe pulmonary nodule, and aortic Atherosclerosis (ICD10-I70.0).   Electronically Signed By: Carrie [name] M.D. On: 08/08/2018 13:43           Grade 3a follicular lymphoma of lymph nodes of multiple regions (Carrie [name].  The patient is a 77 year old female.   Allergies (Carrie [name], Carrie [name]; 08/21/2018 11:35 AM) No Known Drug Allergies [12/20/2014]: Allergies Reconciled  Medication History (Carrie [name], RMA; 08/21/2018 11:35 AM) Amoxicillin (500MG  Capsule, Oral) Active. (prior to dentist appt) Tylenol (500MG  Capsule, Oral) Active. Diltiazem HCl (240MG  Capsule ER 24HR, Oral) Active. Apixaban (5MG  Tablet, Oral) Active. Levothyroxine Sodium (100MCG Tablet, Oral)  Active. Lidocaine-Prilocaine (2.5-2.5% Cream, External) Active. Medications Reconciled    Vitals (Carrie [name] RMA; 08/21/2018 11:35 AM) 08/21/2018 11:35 AM Weight: 142.4 lb  Height: 60in Body Surface Area: 1.62 m Body Mass Index: 27.81 kg/m  Temp.: 97.84F  Pulse: 89 (Regular)  BP: 136/88 (Sitting, Left Arm, Standard)      Physical Exam (Carrie Galyean A. Ryun Velez MD; 08/21/2018 12:07 PM)  General Mental Status-Alert. General Appearance-Consistent with stated age. Hydration-Well hydrated. Voice-Normal.  Chest and Lung Exam Note: Over left scapular mobile 4 cm nodule consistent with lymphadenopathy. This appears to be deep and subcutaneous tissues. It appears. Overlying the trapezius muscle.  Cardiovascular Cardiovascular examination reveals -normal heart sounds, regular rate and rhythm with no murmurs and normal pedal pulses bilaterally.  Neurologic Neurologic evaluation reveals -alert and oriented x 3 with no impairment of recent or remote memory. Mental Status-Normal.  Lymphatic Head & Neck -Note:Mild posterior cervical and retroauricular adenopathy.  Axillary -Note:Mild bilateral adenopathy noted.     Assessment & Plan (Carrie Grant A. Kalani Baray MD; 08/21/2018 12:09 PM)  LYMPHOMA (C85.90) Impression: Oncology requires tissue diagnosis. The mass overlying her left scapula will be the most accessible after reviewing her PET scan. The also request a porch. She's had a Port-A-Cath in the right side and we can try to replace there or left side depending on her intra-operative anatomy Pt requires port placement for chemotherapy. Risk include bleeding, infection, pneumothorax, hemothorax, mediastinal injury, nerve injury , blood vessel injury, strke, blood clots, death, migration. embolization and need for additional procedures. Pt agrees to proceed. operative anatomy. Risk of mass excision including bleeding, infection, nerve injury, muscle injury,  weakness, and cardiovascular events.  Current Plans Use of a central venous catheter for intravenous therapy was discussed. Technique of catheter placement using ultrasound and fluoroscopy guidance was discussed. Risks such as bleeding, infection, pneumothorax, catheter occlusion, reoperation, and other risks were discussed. I noted a good likelihood this will help address the problem. Questions were answered. The patient expressed understanding & wishes to proceed. The pathophysiology of skin & subcutaneous masses was discussed. Natural history risks without surgery were discussed. I recommended surgery to remove the mass. I explained the technique of removal with use of local anesthesia & possible need for more aggressive sedation/anesthesia for patient comfort.  Risks such as bleeding, infection, wound breakdown, heart attack, death, and other risks were discussed. I noted a good likelihood this will help address the problem. Possibility that this will not correct all symptoms was explained. Possibility of regrowth/recurrence of the mass was discussed. We will work to minimize complications. Questions were answered. The patient expresses understanding & wishes to proceed with surgery.  Pt Education - CCS Free Text Education/Instructions: discussed with patient and provided information.

## 2018-08-21 NOTE — H&P (View-Only) (Signed)
[name] Documented: 08/21/2018 11:34 AM Location: [facility] Patient #: [id number] DOB: 05-06-1941 Widowed / Language: [name] / Race: White Female  History of Present Illness Marcello Moores A. Christionna Poland MD; 08/21/2018 12:06 PM) Patient words: Patient returns for follow-up after initial treatment of follicular lymphoma in 1914. She was found to have a mass over her left scapula that began to grow. This prompted a PET scan which showed recurrence of what appears to be intra-abdominal and thoracic lymphoma as well as subcutaneous nodules overlying the left scapula. She is sent today for excision of this left scapular mass and placement of port for what appears to be recurrent lymphoma.       Subsequent treatment strategy for restaging of follicular lymphoma.  EXAM: NUCLEAR MEDICINE PET SKULL BASE TO THIGH  TECHNIQUE: 6.8 mCi F-18 FDG was injected intravenously. Full-ring PET imaging was performed from the skull base to thigh after the radiotracer. CT data was obtained and used for attenuation correction and anatomic localization.  Fasting blood glucose: 103 mg/dl  COMPARISON: 09/14/2017  FINDINGS: Mediastinal blood pool activity: SUV max 2.1  NECK: There is hypermetabolic "brown" fat and muscular activity within the neck and upper chest. Given this factor, no cervical nodal hypermetabolism identified. There is right nasopharyngeal hypermetabolism without CT correlate, measuring a S.U.V. max of 5.8 on image 12/4. The thyroid gland demonstrates diffuse mild hypermetabolism, similar.  Incidental CT findings: No cervical adenopathy.  CHEST: Development of hypermetabolic left axillary lymph nodes. These measure maximally 11 mm and a S.U.V. max of 6.7 on image 49/4.  A new node superficial to the left scapula measures 8 mm and a S.U.V. max of 4.8 on image 54/4.  Subcutaneous nodules, likely nodes, superficial the right supraspinatus muscle measure maximally 10 mm  and a S.U.V. max of 8.7 on image 61/4.  Incidental CT findings: Aortic atherosclerosis. Mild cardiomegaly. Pulmonary artery enlargement, outflow tract 3.5 cm. A 4 mm left lower lobe pulmonary nodule is unchanged on image 72/4.  ABDOMEN/PELVIS: Hypermetabolism corresponding to soft tissue thickening just deep to the left lateral eleventh rib. Adjacent subcutaneous hypermetabolic nodule which measures 10 mm and a S.U.V. max of 3.6 on image 107/4.  Incidental CT findings: Normal adrenal glands. Abdominal aortic atherosclerosis. Beam hardening artifact from lumbar spine fixation. Small retroperitoneal nodes are grossly similar. Hysterectomy versus uterine atrophy. Pelvic floor laxity.  SKELETON: No abnormal marrow activity.  Incidental CT findings: Degenerative changes of the right hip.  IMPRESSION: 1. Evidence of progressive/recurrent disease, as evidenced by new hypermetabolic nodes and subcutaneous nodule/nodes throughout left chest and abdomen. (Deauville 5). 2. Likely physiologic right nasopharyngeal hypermetabolism. Recommend attention on follow-up. 3. Incidental findings, including pulmonary artery enlargement, suggesting pulmonary arterial hypertension, stable left lower lobe pulmonary nodule, and aortic Atherosclerosis (ICD10-I70.0).   Electronically Signed By: Abigail Miyamoto M.D. On: 08/08/2018 13:43           Grade 3a follicular lymphoma of lymph nodes of multiple regions ([place].  The patient is a 77 year old female.   Allergies (Tanisha A. Owens Shark, [place]; 08/21/2018 11:35 AM) No Known Drug Allergies [12/20/2014]: Allergies Reconciled  Medication History (Tanisha A. Owens Shark, RMA; 08/21/2018 11:35 AM) Amoxicillin (500MG  Capsule, Oral) Active. (prior to dentist appt) Tylenol (500MG  Capsule, Oral) Active. Diltiazem HCl (240MG  Capsule ER 24HR, Oral) Active. Apixaban (5MG  Tablet, Oral) Active. Levothyroxine Sodium (100MCG Tablet, Oral)  Active. Lidocaine-Prilocaine (2.5-2.5% Cream, External) Active. Medications Reconciled    Vitals (Tanisha A. Brown RMA; 08/21/2018 11:35 AM) 08/21/2018 11:35 AM Weight: 142.4 lb  Height: 60in Body Surface Area: 1.62 m Body Mass Index: 27.81 kg/m  Temp.: 97.42F  Pulse: 89 (Regular)  BP: 136/88 (Sitting, Left Arm, Standard)      Physical Exam (Tanita Palinkas A. Naomi Fitton MD; 08/21/2018 12:07 PM)  General Mental Status-Alert. General Appearance-Consistent with stated age. Hydration-Well hydrated. Voice-Normal.  Chest and Lung Exam Note: Over left scapular mobile 4 cm nodule consistent with lymphadenopathy. This appears to be deep and subcutaneous tissues. It appears. Overlying the trapezius muscle.  Cardiovascular Cardiovascular examination reveals -normal heart sounds, regular rate and rhythm with no murmurs and normal pedal pulses bilaterally.  Neurologic Neurologic evaluation reveals -alert and oriented x 3 with no impairment of recent or remote memory. Mental Status-Normal.  Lymphatic Head & Neck -Note:Mild posterior cervical and retroauricular adenopathy.  Axillary -Note:Mild bilateral adenopathy noted.     Assessment & Plan (Chancie Lampert A. Marlyce Mcdougald MD; 08/21/2018 12:09 PM)  LYMPHOMA (C85.90) Impression: Oncology requires tissue diagnosis. The mass overlying her left scapula will be the most accessible after reviewing her PET scan. The also request a porch. She's had a Port-A-Cath in the right side and we can try to replace there or left side depending on her intra-operative anatomy Pt requires port placement for chemotherapy. Risk include bleeding, infection, pneumothorax, hemothorax, mediastinal injury, nerve injury , blood vessel injury, strke, blood clots, death, migration. embolization and need for additional procedures. Pt agrees to proceed. operative anatomy. Risk of mass excision including bleeding, infection, nerve injury, muscle injury,  weakness, and cardiovascular events.  Current Plans Use of a central venous catheter for intravenous therapy was discussed. Technique of catheter placement using ultrasound and fluoroscopy guidance was discussed. Risks such as bleeding, infection, pneumothorax, catheter occlusion, reoperation, and other risks were discussed. I noted a good likelihood this will help address the problem. Questions were answered. The patient expressed understanding & wishes to proceed. The pathophysiology of skin & subcutaneous masses was discussed. Natural history risks without surgery were discussed. I recommended surgery to remove the mass. I explained the technique of removal with use of local anesthesia & possible need for more aggressive sedation/anesthesia for patient comfort.  Risks such as bleeding, infection, wound breakdown, heart attack, death, and other risks were discussed. I noted a good likelihood this will help address the problem. Possibility that this will not correct all symptoms was explained. Possibility of regrowth/recurrence of the mass was discussed. We will work to minimize complications. Questions were answered. The patient expresses understanding & wishes to proceed with surgery.  Pt Education - CCS Free Text Education/Instructions: discussed with patient and provided information.

## 2018-08-22 ENCOUNTER — Telehealth: Payer: Self-pay | Admitting: Hematology and Oncology

## 2018-08-22 ENCOUNTER — Telehealth: Payer: Self-pay

## 2018-08-22 NOTE — Telephone Encounter (Signed)
She called back and gave below message. She verbalized understanding.

## 2018-08-22 NOTE — Telephone Encounter (Signed)
She called and left a message to call her. She is having her port placement on 11/12 by Dr. Brantley Stage for port placement and biopsy.  Above message given to Dr. Lindi Adie. Scheduling message sent for appt with Dr. Lindi Adie on 11/18.  Called and left above message on Ms. Barley's voicemail. Ask her to call the office back.

## 2018-08-22 NOTE — Telephone Encounter (Signed)
Scheduled appt per 1/15 sch message - pt is aware of appt date and time   

## 2018-08-23 NOTE — Pre-Procedure Instructions (Signed)
Carrie [name]  08/23/2018      [place], [place] - [id number] [place] [address] Phone: [phone] Fax: [phone]    Your procedure is scheduled on Nov 12th.  Report to [facility] Admitting at 9:30 A.M.  Call this number if you have problems the morning of surgery:  [phone]   Remember:  Do not eat or drink after midnight.   You may drink clear liquids until 8:30 AM.  Clear liquids allowed are:  Water, Juice (non-citric and without pulp), Carbonated beverages, Clear Tea, Black Coffee only and Gatorade   Please complete your PRE-SURGERY ENSURE that was given to before you leave your house the morning of surgery.  Please, if able, drink it in one setting. DO NOT SIP.    Take these medicines the morning of surgery with A SIP OF WATER    Amoxicillin   Levothyroxine (Synthroid)  Follow your surgeon's instructions on when to stop Eliquis.  If no instructions were given by your surgeon then you will need to call the office to get those instructions.    7 days prior to surgery STOP taking any Aspirin(unless otherwise instructed by your surgeon), Aleve, Naproxen, Ibuprofen, Motrin, Advil, Goody's, BC's, all herbal medications, fish oil, and all vitamins    Do not wear jewelry, make-up or nail polish.  Do not wear lotions, powders, or perfumes, or deodorant.  Do not shave 48 hours prior to surgery.  Men may shave face and neck.  Do not bring valuables to the hospital.  [facility] is not responsible for any belongings or valuables.   [place]- Preparing For Surgery  Before surgery, you can play an important role. Because skin is not sterile, your skin needs to be as free of germs as possible. You can reduce the number of germs on your skin by washing with CHG (chlorahexidine gluconate) Soap before surgery.  CHG is an antiseptic cleaner which kills germs and bonds with  the skin to continue killing germs even after washing.    Oral Hygiene is also important to reduce your risk of infection.  Remember - BRUSH YOUR TEETH THE MORNING OF SURGERY WITH YOUR REGULAR TOOTHPASTE  Please do not use if you have an allergy to CHG or antibacterial soaps. If your skin becomes reddened/irritated stop using the CHG.  Do not shave (including legs and underarms) for at least 48 hours prior to first CHG shower. It is OK to shave your face.  Please follow these instructions carefully.   1. Shower the NIGHT BEFORE SURGERY and the MORNING OF SURGERY with CHG.   2. If you chose to wash your hair, wash your hair first as usual with your normal shampoo.  3. After you shampoo, rinse your hair and body thoroughly to remove the shampoo.  4. Use CHG as you would any other liquid soap. You can apply CHG directly to the skin and wash gently with a scrungie or a clean washcloth.   5. Apply the CHG Soap to your body ONLY FROM THE NECK DOWN.  Do not use on open wounds or open sores. Avoid contact with your eyes, ears, mouth and genitals (private parts). Wash Face and genitals (private parts)  with your normal soap.  6. Wash thoroughly, paying special attention to the area where your surgery will be performed.  7. Thoroughly rinse your body with warm water from the neck down.  8. DO  NOT shower/wash with your normal soap after using and rinsing off the CHG Soap.  9. Pat yourself dry with a CLEAN TOWEL.  10. Wear CLEAN PAJAMAS to bed the night before surgery, wear comfortable clothes the morning of surgery  11. Place CLEAN SHEETS on your bed the night of your first shower and DO NOT SLEEP WITH PETS.   Day of Surgery:  Do not apply any deodorants/lotions.  Please wear clean clothes to the hospital/surgery center.   Remember to brush your teeth WITH YOUR REGULAR TOOTHPASTE.   Contacts, dentures or bridgework may not be worn into surgery.  Leave your suitcase in the car.  After  surgery it may be brought to your room.  For patients admitted to the hospital, discharge time will be determined by your treatment team.  Patients discharged the day of surgery will not be allowed to drive home.   Please read over the following fact sheets that you were given. Surgical Site Infection Prevention

## 2018-08-24 ENCOUNTER — Other Ambulatory Visit: Payer: Self-pay

## 2018-08-24 ENCOUNTER — Encounter (HOSPITAL_COMMUNITY): Payer: Self-pay

## 2018-08-24 ENCOUNTER — Encounter (HOSPITAL_COMMUNITY)
Admission: RE | Admit: 2018-08-24 | Discharge: 2018-08-24 | Disposition: A | Discharge status: Home / Self Care | Payer: Medicare HMO | Source: Ambulatory Visit | Attending: Surgery | Admitting: Surgery

## 2018-08-24 DIAGNOSIS — R9431 Abnormal electrocardiogram [ECG] [EKG]: Secondary | ICD-10-CM | POA: Insufficient documentation

## 2018-08-24 DIAGNOSIS — R222 Localized swelling, mass and lump, trunk: Secondary | ICD-10-CM | POA: Insufficient documentation

## 2018-08-24 DIAGNOSIS — C8590 Non-Hodgkin lymphoma, unspecified, unspecified site: Secondary | ICD-10-CM | POA: Diagnosis not present

## 2018-08-24 DIAGNOSIS — Z01818 Encounter for other preprocedural examination: Secondary | ICD-10-CM | POA: Diagnosis not present

## 2018-08-24 HISTORY — DX: Carpal tunnel syndrome, bilateral upper limbs: G56.03

## 2018-08-24 HISTORY — DX: Cardiac arrhythmia, unspecified: I49.9

## 2018-08-24 LAB — CBC
HCT: 45.9 % (ref 36.0–46.0)
Hemoglobin: 14 g/dL (ref 12.0–15.0)
MCH: 29.2 pg (ref 26.0–34.0)
MCHC: 30.5 g/dL (ref 30.0–36.0)
MCV: 95.8 fL (ref 80.0–100.0)
Platelets: 238 10*3/uL (ref 150–400)
RBC: 4.79 MIL/uL (ref 3.87–5.11)
RDW: 12.4 % (ref 11.5–15.5)
WBC: 6.7 10*3/uL (ref 4.0–10.5)
nRBC: 0 % (ref 0.0–0.2)

## 2018-08-24 LAB — BASIC METABOLIC PANEL
Anion gap: 10 (ref 5–15)
BUN: 23 mg/dL (ref 8–23)
CO2: 25 mmol/L (ref 22–32)
Calcium: 9.5 mg/dL (ref 8.9–10.3)
Chloride: 103 mmol/L (ref 98–111)
Creatinine, Ser: 1.17 mg/dL — ABNORMAL HIGH (ref 0.44–1.00)
GFR calc Af Amer: 51 mL/min — ABNORMAL LOW (ref >60)
GFR calc non Af Amer: 44 mL/min — ABNORMAL LOW (ref >60)
Glucose, Bld: 89 mg/dL (ref 70–99)
Potassium: 4.4 mmol/L (ref 3.5–5.1)
Sodium: 138 mmol/L (ref 135–145)

## 2018-08-24 NOTE — Progress Notes (Signed)
PCP is Dr. Marjo Bicker  LOV 01/2018 Oncologist is Dr. Alvy Bimler Back in 2017 after receiving her last chemo, developed a severe UTI, which led her to [facility]. Heart went into A-fib then.  (currently back in NSR) Cardio was Dr. Higinio Plan at the [facility] in [place] (he has since left and she now sees the PA, Mr. Shelly Bombard.)  She was placed on eliquis then.  Her last dose of Eliquis will be 08/25/2018 She denies murmur, cp. Echo 2016 Stress 2016

## 2018-08-24 NOTE — Pre-Procedure Instructions (Signed)
Carrie [name]  08/24/2018      [place], [place] - [facility] [phone] [place] [address] Phone: [phone] Fax: [phone]    Your procedure is scheduled on Tuesday,  Nov 12th.   Report to [facility] Admitting at 9:30 A.M.             (posted surgery time 11:30a - 1:30p)   Call this number if you have problems the morning of surgery:  [phone]   Remember:  Do not eat any foods after midnight, Monday.   You may drink clear liquids until 8:30 AM.  Clear liquids allowed are:  Water, Juice (non-citric and without pulp), Carbonated beverages, Clear Tea, Black Coffee only and Gatorade   Please complete your PRE-SURGERY ENSURE that was given to before you leave your house the morning of surgery.  Please, if able, drink it in one setting. DO NOT SIP.    Take these medicines the morning of surgery with A SIP OF WATER    Amoxicillin   Levothyroxine (Synthroid)  Follow your surgeon's instructions on when to stop Eliquis.  If no instructions were given by your surgeon then you will need to call the office to get those instructions.    7 days prior to surgery STOP taking any Aspirin(unless otherwise instructed by your surgeon), Aleve, Naproxen, Ibuprofen, Motrin, Advil, Goody's, BC's, all herbal medications, fish oil, and all vitamins    Do not wear jewelry, make-up or nail polish.  Do not wear lotions, powders,  perfumes, or deodorant.  Do not shave 48 hours prior to surgery.    Do not bring valuables to the hospital.  [facility] is not responsible for any belongings or valuables.     [facility]- Preparing For Surgery  Before surgery, you can play an important role. Because skin is not sterile, your skin needs to be as free of germs as possible. You can reduce the number of germs on your skin by washing with CHG (chlorahexidine gluconate) Soap before surgery.  CHG is an  antiseptic cleaner which kills germs and bonds with the skin to continue killing germs even after washing.    Oral Hygiene is also important to reduce your risk of infection.    Remember - BRUSH YOUR TEETH THE MORNING OF SURGERY WITH YOUR REGULAR TOOTHPASTE  Please do not use if you have an allergy to CHG or antibacterial soaps. If your skin becomes reddened/irritated stop using the CHG.  Do not shave (including legs and underarms) for at least 48 hours prior to first CHG shower. It is OK to shave your face.  Please follow these instructions carefully.   1. Shower the NIGHT BEFORE SURGERY and the MORNING OF SURGERY with CHG.   2. If you chose to wash your hair, wash your hair first as usual with your normal shampoo.  3. After you shampoo, rinse your hair and body thoroughly to remove the shampoo.  4. Use CHG as you would any other liquid soap. You can apply CHG directly to the skin and wash gently with a scrungie or a clean washcloth.   5. Apply the CHG Soap to your body ONLY FROM THE NECK DOWN.  Do not use on open wounds or open sores. Avoid contact with your eyes, ears, mouth and genitals (private parts). Wash Face and genitals (private parts)  with your normal soap.  6. Wash thoroughly, paying special attention to the area  where your surgery will be performed.  7. Thoroughly rinse your body with warm water from the neck down.  8. DO NOT shower/wash with your normal soap after using and rinsing off the CHG Soap.  9. Pat yourself dry with a CLEAN TOWEL.  10. Wear CLEAN PAJAMAS to bed the night before surgery, wear comfortable clothes the morning of surgery  11. Place CLEAN SHEETS on your bed the night of your first shower and DO NOT SLEEP WITH PETS.   Day of Surgery:  Do not apply any deodorants/lotions.  Please wear clean clothes to the hospital/surgery center.   Remember to brush your teeth WITH YOUR REGULAR TOOTHPASTE.   Contacts, dentures or bridgework may not be worn  into surgery.  Leave your suitcase in the car.  After surgery it may be brought to your room.  For patients admitted to the hospital, discharge time will be determined by your treatment team.  Patients discharged the day of surgery will not be allowed to drive home.   Please read over the following fact sheets that you were given. Surgical Site Infection Prevention

## 2018-08-28 NOTE — Anesthesia Preprocedure Evaluation (Addendum)
Anesthesia Evaluation  Patient identified by MRN, date of birth, ID band Patient awake    Reviewed: Allergy & Precautions, NPO status , Patient's Chart, lab work & pertinent test results  History of Anesthesia Complications Negative for: history of anesthetic complications  Airway Mallampati: II  TM Distance: >3 FB Neck ROM: Full    Dental no notable dental hx. (+) Teeth Intact, Dental Advidsory Given   Pulmonary neg pulmonary ROS,    Pulmonary exam normal        Cardiovascular hypertension, Normal cardiovascular exam+ dysrhythmias Atrial Fibrillation  Rhythm:Regular Rate:Normal  TTE 10/15/15: EF 60-65% (limited echo for EF) MPS 10/22/15: EF 55%, no ischemia   Neuro/Psych negative neurological ROS  negative psych ROS   GI/Hepatic negative GI ROS, Neg liver ROS,   Endo/Other  diabetesHypothyroidism   Renal/GU Renal InsufficiencyRenal disease  negative genitourinary   Musculoskeletal negative musculoskeletal ROS (+)   Abdominal   Peds  Hematology negative hematology ROS (+)   Anesthesia Other Findings   Reproductive/Obstetrics                           Anesthesia Physical Anesthesia Plan  ASA: III  Anesthesia Plan: General   Post-op Pain Management:    Induction: Intravenous  PONV Risk Score and Plan: 3 and Ondansetron, Dexamethasone and Treatment may vary due to age or medical condition  Airway Management Planned: Oral ETT  Additional Equipment: None  Intra-op Plan:   Post-operative Plan: Extubation in OR  Informed Consent: I have reviewed the patients History and Physical, chart, labs and discussed the procedure including the risks, benefits and alternatives for the proposed anesthesia with the patient or authorized representative who has indicated his/her understanding and acceptance.   Dental Advisory Given  Plan Discussed with:   Anesthesia Plan Comments:        Anesthesia Quick Evaluation

## 2018-08-29 ENCOUNTER — Ambulatory Visit (HOSPITAL_COMMUNITY)
Admission: RE | Admit: 2018-08-29 | Discharge: 2018-08-29 | Disposition: A | Discharge status: Home / Self Care | Payer: Medicare HMO | Source: Ambulatory Visit | Attending: Surgery | Admitting: Surgery

## 2018-08-29 ENCOUNTER — Ambulatory Visit (HOSPITAL_COMMUNITY): Payer: Medicare HMO | Admitting: Physician Assistant

## 2018-08-29 ENCOUNTER — Ambulatory Visit (HOSPITAL_COMMUNITY): Payer: Medicare HMO

## 2018-08-29 ENCOUNTER — Encounter (HOSPITAL_COMMUNITY): Payer: Self-pay | Admitting: Urology

## 2018-08-29 ENCOUNTER — Ambulatory Visit (HOSPITAL_COMMUNITY): Payer: Medicare HMO | Admitting: Anesthesiology

## 2018-08-29 ENCOUNTER — Encounter (HOSPITAL_COMMUNITY)
Admission: RE | Disposition: A | Discharge status: Home / Self Care | Payer: Self-pay | Source: Ambulatory Visit | Attending: Surgery

## 2018-08-29 DIAGNOSIS — E039 Hypothyroidism, unspecified: Secondary | ICD-10-CM | POA: Diagnosis not present

## 2018-08-29 DIAGNOSIS — Z79890 Hormone replacement therapy (postmenopausal): Secondary | ICD-10-CM | POA: Diagnosis not present

## 2018-08-29 DIAGNOSIS — I10 Essential (primary) hypertension: Secondary | ICD-10-CM | POA: Diagnosis not present

## 2018-08-29 DIAGNOSIS — Z95828 Presence of other vascular implants and grafts: Secondary | ICD-10-CM

## 2018-08-29 DIAGNOSIS — Z419 Encounter for procedure for purposes other than remedying health state, unspecified: Secondary | ICD-10-CM

## 2018-08-29 DIAGNOSIS — R222 Localized swelling, mass and lump, trunk: Secondary | ICD-10-CM | POA: Diagnosis not present

## 2018-08-29 DIAGNOSIS — Z79899 Other long term (current) drug therapy: Secondary | ICD-10-CM | POA: Diagnosis not present

## 2018-08-29 DIAGNOSIS — I4891 Unspecified atrial fibrillation: Secondary | ICD-10-CM | POA: Diagnosis not present

## 2018-08-29 DIAGNOSIS — C8238 Follicular lymphoma grade IIIa, lymph nodes of multiple sites: Secondary | ICD-10-CM | POA: Diagnosis not present

## 2018-08-29 DIAGNOSIS — C8200 Follicular lymphoma grade I, unspecified site: Secondary | ICD-10-CM | POA: Diagnosis not present

## 2018-08-29 DIAGNOSIS — I129 Hypertensive chronic kidney disease with stage 1 through stage 4 chronic kidney disease, or unspecified chronic kidney disease: Secondary | ICD-10-CM | POA: Diagnosis not present

## 2018-08-29 DIAGNOSIS — N183 Chronic kidney disease, stage 3 (moderate): Secondary | ICD-10-CM | POA: Diagnosis not present

## 2018-08-29 DIAGNOSIS — Z4682 Encounter for fitting and adjustment of non-vascular catheter: Secondary | ICD-10-CM | POA: Diagnosis not present

## 2018-08-29 DIAGNOSIS — C8590 Non-Hodgkin lymphoma, unspecified, unspecified site: Secondary | ICD-10-CM | POA: Diagnosis not present

## 2018-08-29 HISTORY — PX: PORTACATH PLACEMENT: SHX2246

## 2018-08-29 HISTORY — PX: MASS EXCISION: SHX2000

## 2018-08-29 LAB — PROTIME-INR
INR: 1.03
Prothrombin Time: 13.4 seconds (ref 11.4–15.2)

## 2018-08-29 SURGERY — INSERTION, TUNNELED CENTRAL VENOUS DEVICE, WITH PORT
Anesthesia: General | Site: Chest

## 2018-08-29 MED ORDER — OXYCODONE HCL 5 MG/5ML PO SOLN: 5.0000 mg | Freq: Once | ORAL | Status: AC | PRN

## 2018-08-29 MED ORDER — LACTATED RINGERS IV SOLN
INTRAVENOUS | Status: DC
  Administered 2018-08-29: 08:00:00 via INTRAVENOUS

## 2018-08-29 MED ORDER — PHENYLEPHRINE HCL 10 MG/ML IJ SOLN
INTRAMUSCULAR | Status: DC | PRN
  Administered 2018-08-29 (×4): 80 ug via INTRAVENOUS

## 2018-08-29 MED ORDER — FENTANYL CITRATE (PF) 100 MCG/2ML IJ SOLN: 25.0000 ug | INTRAMUSCULAR | Status: DC | PRN

## 2018-08-29 MED ORDER — BUPIVACAINE HCL (PF) 0.25 % IJ SOLN
INTRAMUSCULAR | Status: AC
  Filled 2018-08-29: qty 30

## 2018-08-29 MED ORDER — SODIUM CHLORIDE 0.9 % IV SOLN
INTRAVENOUS | Status: AC
  Filled 2018-08-29: qty 1.2

## 2018-08-29 MED ORDER — GABAPENTIN 300 MG PO CAPS
300.0000 mg | ORAL_CAPSULE | ORAL | Status: AC
  Administered 2018-08-29: 300 mg via ORAL

## 2018-08-29 MED ORDER — PROPOFOL 10 MG/ML IV BOLUS
INTRAVENOUS | Status: AC
  Filled 2018-08-29: qty 20

## 2018-08-29 MED ORDER — GABAPENTIN 300 MG PO CAPS
ORAL_CAPSULE | ORAL | Status: AC
  Administered 2018-08-29: 300 mg via ORAL
  Filled 2018-08-29: qty 1

## 2018-08-29 MED ORDER — ACETAMINOPHEN 500 MG PO TABS
ORAL_TABLET | ORAL | Status: AC
  Administered 2018-08-29: 1000 mg via ORAL
  Filled 2018-08-29: qty 2

## 2018-08-29 MED ORDER — ONDANSETRON HCL 4 MG/2ML IJ SOLN
INTRAMUSCULAR | Status: DC | PRN
  Administered 2018-08-29: 4 mg via INTRAVENOUS

## 2018-08-29 MED ORDER — IBUPROFEN 800 MG PO TABS: 800.0000 mg | ORAL_TABLET | Freq: Three times a day (TID) | ORAL | 0 refills | Status: DC | PRN

## 2018-08-29 MED ORDER — CELECOXIB 200 MG PO CAPS
ORAL_CAPSULE | ORAL | Status: AC
  Administered 2018-08-29: 400 mg via ORAL
  Filled 2018-08-29: qty 1

## 2018-08-29 MED ORDER — ONDANSETRON HCL 4 MG/2ML IJ SOLN: 4.0000 mg | Freq: Once | INTRAMUSCULAR | Status: DC | PRN

## 2018-08-29 MED ORDER — HEPARIN SOD (PORK) LOCK FLUSH 100 UNIT/ML IV SOLN
INTRAVENOUS | Status: DC | PRN
  Administered 2018-08-29: 500 [IU]

## 2018-08-29 MED ORDER — LIDOCAINE 2% (20 MG/ML) 5 ML SYRINGE
INTRAMUSCULAR | Status: DC | PRN
  Administered 2018-08-29: 60 mg via INTRAVENOUS
  Administered 2018-08-29: 10 mg via INTRAVENOUS

## 2018-08-29 MED ORDER — ROCURONIUM BROMIDE 50 MG/5ML IV SOSY
PREFILLED_SYRINGE | INTRAVENOUS | Status: DC | PRN
  Administered 2018-08-29: 30 mg via INTRAVENOUS

## 2018-08-29 MED ORDER — FENTANYL CITRATE (PF) 250 MCG/5ML IJ SOLN
INTRAMUSCULAR | Status: AC
  Filled 2018-08-29: qty 5

## 2018-08-29 MED ORDER — PROPOFOL 10 MG/ML IV BOLUS
INTRAVENOUS | Status: DC | PRN
  Administered 2018-08-29: 100 mg via INTRAVENOUS

## 2018-08-29 MED ORDER — FENTANYL CITRATE (PF) 100 MCG/2ML IJ SOLN
INTRAMUSCULAR | Status: DC | PRN
  Administered 2018-08-29: 25 ug via INTRAVENOUS
  Administered 2018-08-29: 75 ug via INTRAVENOUS

## 2018-08-29 MED ORDER — BUPIVACAINE-EPINEPHRINE (PF) 0.25% -1:200000 IJ SOLN
INTRAMUSCULAR | Status: AC
  Filled 2018-08-29: qty 30

## 2018-08-29 MED ORDER — CEFAZOLIN SODIUM-DEXTROSE 2-4 GM/100ML-% IV SOLN
INTRAVENOUS | Status: AC
  Filled 2018-08-29: qty 100

## 2018-08-29 MED ORDER — BUPIVACAINE-EPINEPHRINE 0.25% -1:200000 IJ SOLN
INTRAMUSCULAR | Status: DC | PRN
  Administered 2018-08-29 (×2): 10 mL

## 2018-08-29 MED ORDER — CHLORHEXIDINE GLUCONATE CLOTH 2 % EX PADS: 6.0000 | MEDICATED_PAD | Freq: Once | CUTANEOUS | Status: DC

## 2018-08-29 MED ORDER — DEXAMETHASONE SODIUM PHOSPHATE 10 MG/ML IJ SOLN
INTRAMUSCULAR | Status: DC | PRN
  Administered 2018-08-29: 10 mg via INTRAVENOUS

## 2018-08-29 MED ORDER — HEPARIN SOD (PORK) LOCK FLUSH 100 UNIT/ML IV SOLN
INTRAVENOUS | Status: AC
  Filled 2018-08-29: qty 5

## 2018-08-29 MED ORDER — SODIUM CHLORIDE 0.9 % IV SOLN
INTRAVENOUS | Status: DC | PRN
  Administered 2018-08-29: 500 mL

## 2018-08-29 MED ORDER — CEFAZOLIN SODIUM-DEXTROSE 2-4 GM/100ML-% IV SOLN
2.0000 g | INTRAVENOUS | Status: AC
  Administered 2018-08-29: 2 g via INTRAVENOUS

## 2018-08-29 MED ORDER — SODIUM CHLORIDE 0.9 % IV SOLN
INTRAVENOUS | Status: DC | PRN
  Administered 2018-08-29: 50 ug/min via INTRAVENOUS

## 2018-08-29 MED ORDER — ACETAMINOPHEN 500 MG PO TABS
1000.0000 mg | ORAL_TABLET | ORAL | Status: AC
  Administered 2018-08-29: 1000 mg via ORAL

## 2018-08-29 MED ORDER — CELECOXIB 200 MG PO CAPS
400.0000 mg | ORAL_CAPSULE | ORAL | Status: AC
  Administered 2018-08-29: 400 mg via ORAL
  Filled 2018-08-29: qty 2

## 2018-08-29 MED ORDER — OXYCODONE HCL 5 MG PO TABS: 5.0000 mg | ORAL_TABLET | Freq: Four times a day (QID) | ORAL | 0 refills | Status: DC | PRN

## 2018-08-29 MED ORDER — 0.9 % SODIUM CHLORIDE (POUR BTL) OPTIME
TOPICAL | Status: DC | PRN
  Administered 2018-08-29: 1000 mL

## 2018-08-29 MED ORDER — LACTATED RINGERS IV SOLN
INTRAVENOUS | Status: DC | PRN
  Administered 2018-08-29: 08:00:00 via INTRAVENOUS

## 2018-08-29 MED ORDER — OXYCODONE HCL 5 MG PO TABS
5.0000 mg | ORAL_TABLET | Freq: Once | ORAL | Status: AC | PRN
  Administered 2018-08-29: 5 mg via ORAL

## 2018-08-29 MED ORDER — OXYCODONE HCL 5 MG PO TABS
ORAL_TABLET | ORAL | Status: AC
  Filled 2018-08-29: qty 1

## 2018-08-29 SURGICAL SUPPLY — 55 items
ADH SKN CLS APL DERMABOND .7 (GAUZE/BANDAGES/DRESSINGS) ×4
BAG DECANTER FOR FLEXI CONT (MISCELLANEOUS) ×3 IMPLANT
BLADE CLIPPER SURG (BLADE) IMPLANT
BLADE SURG 11 STRL SS (BLADE) ×1 IMPLANT
CHLORAPREP W/TINT 26ML (MISCELLANEOUS) ×3 IMPLANT
COVER SURGICAL LIGHT HANDLE (MISCELLANEOUS) ×3 IMPLANT
COVER TRANSDUCER ULTRASND GEL (DRAPE) ×3 IMPLANT
COVER WAND RF STERILE (DRAPES) ×3 IMPLANT
CRADLE DONUT ADULT HEAD (MISCELLANEOUS) ×3 IMPLANT
DECANTER SPIKE VIAL GLASS SM (MISCELLANEOUS) ×3 IMPLANT
DERMABOND ADVANCED (GAUZE/BANDAGES/DRESSINGS) ×2
DERMABOND ADVANCED .7 DNX12 (GAUZE/BANDAGES/DRESSINGS) ×2 IMPLANT
DRAPE C-ARM 42X72 X-RAY (DRAPES) ×3 IMPLANT
DRAPE LAPAROSCOPIC ABDOMINAL (DRAPES) ×3 IMPLANT
DRAPE LAPAROTOMY 100X72 PEDS (DRAPES) IMPLANT
DRAPE UTILITY XL STRL (DRAPES) ×6 IMPLANT
ELECT CAUTERY BLADE 6.4 (BLADE) ×3 IMPLANT
ELECT REM PT RETURN 9FT ADLT (ELECTROSURGICAL) ×3
ELECTRODE REM PT RTRN 9FT ADLT (ELECTROSURGICAL) ×2 IMPLANT
GAUZE 4X4 16PLY RFD (DISPOSABLE) ×4 IMPLANT
GEL ULTRASOUND 20GR AQUASONIC (MISCELLANEOUS) IMPLANT
GLOVE BIO SURGEON STRL SZ8 (GLOVE) ×3 IMPLANT
GLOVE BIOGEL PI IND STRL 8 (GLOVE) ×2 IMPLANT
GLOVE BIOGEL PI INDICATOR 8 (GLOVE) ×1
GOWN STRL REUS W/ TWL LRG LVL3 (GOWN DISPOSABLE) ×4 IMPLANT
GOWN STRL REUS W/ TWL XL LVL3 (GOWN DISPOSABLE) ×2 IMPLANT
GOWN STRL REUS W/TWL LRG LVL3 (GOWN DISPOSABLE) ×6
GOWN STRL REUS W/TWL XL LVL3 (GOWN DISPOSABLE) ×3
INTRODUCER COOK 11FR (CATHETERS) IMPLANT
KIT BASIN OR (CUSTOM PROCEDURE TRAY) ×3 IMPLANT
KIT PORT POWER 8FR ISP CVUE (Port) ×1 IMPLANT
KIT TURNOVER KIT B (KITS) ×3 IMPLANT
NDL HYPO 25GX1X1/2 BEV (NEEDLE) ×2 IMPLANT
NEEDLE HYPO 25GX1X1/2 BEV (NEEDLE) ×3 IMPLANT
NS IRRIG 1000ML POUR BTL (IV SOLUTION) ×3 IMPLANT
PACK SURGICAL SETUP 50X90 (CUSTOM PROCEDURE TRAY) ×3 IMPLANT
PAD ARMBOARD 7.5X6 YLW CONV (MISCELLANEOUS) ×3 IMPLANT
PENCIL BUTTON HOLSTER BLD 10FT (ELECTRODE) ×3 IMPLANT
SET INTRODUCER 12FR PACEMAKER (INTRODUCER) IMPLANT
SET SHEATH INTRODUCER 10FR (MISCELLANEOUS) IMPLANT
SHEATH COOK PEEL AWAY SET 9F (SHEATH) IMPLANT
SPECIMEN JAR MEDIUM (MISCELLANEOUS) IMPLANT
SPONGE LAP 18X18 X RAY DECT (DISPOSABLE) ×3 IMPLANT
SUT MNCRL AB 4-0 PS2 18 (SUTURE) ×4 IMPLANT
SUT PROLENE 2 0 SH 30 (SUTURE) ×3 IMPLANT
SUT VIC AB 2-0 SH 27 (SUTURE) ×3
SUT VIC AB 2-0 SH 27X BRD (SUTURE) ×2 IMPLANT
SUT VIC AB 3-0 SH 27 (SUTURE) ×6
SUT VIC AB 3-0 SH 27X BRD (SUTURE) ×2 IMPLANT
SUT VIC AB 3-0 SH 27XBRD (SUTURE) ×2 IMPLANT
SYR 5ML LUER SLIP (SYRINGE) ×3 IMPLANT
SYR CONTROL 10ML LL (SYRINGE) ×3 IMPLANT
TOWEL OR 17X24 6PK STRL BLUE (TOWEL DISPOSABLE) ×3 IMPLANT
TOWEL OR 17X26 10 PK STRL BLUE (TOWEL DISPOSABLE) ×3 IMPLANT
TRAY LAPAROSCOPIC MC (CUSTOM PROCEDURE TRAY) ×3 IMPLANT

## 2018-08-29 NOTE — Anesthesia Postprocedure Evaluation (Signed)
Anesthesia Post Note  Patient: ANTRICE PAL  Procedure(s) Performed: INSERTION PORT-A-CATH WITH ULTRA SOUND ERAS PATHWAY (N/A Chest) EXCISION LEFT BACK  MASS (Left Back)     Patient location during evaluation: PACU Anesthesia Type: General Level of consciousness: awake and alert Pain management: pain level controlled Vital Signs Assessment: post-procedure vital signs reviewed and stable Respiratory status: spontaneous breathing, nonlabored ventilation and respiratory function stable Cardiovascular status: blood pressure returned to baseline and stable Postop Assessment: no apparent nausea or vomiting Anesthetic complications: no    Last Vitals:  Vitals:   08/29/18 1034 08/29/18 1052  BP: 135/62 (!) 146/87  Pulse: 80 69  Resp: 20 14  Temp: 36.4 C   SpO2: 99% 94%    Last Pain:  Vitals:   08/29/18 1052  TempSrc:   PainSc: 0-No pain                 Lidia Collum

## 2018-08-29 NOTE — Anesthesia Procedure Notes (Signed)
Procedure Name: Intubation Date/Time: 08/29/2018 9:05 AM Performed by: Neldon Newport, CRNA Pre-anesthesia Checklist: Timeout performed, Patient being monitored, Suction available, Emergency Drugs available and Patient identified Patient Re-evaluated:Patient Re-evaluated prior to induction Oxygen Delivery Method: Circle system utilized Preoxygenation: Pre-oxygenation with 100% oxygen Induction Type: IV induction Ventilation: Mask ventilation without difficulty Laryngoscope Size: Mac and 3 Grade View: Grade I Tube type: Oral Tube size: 7.0 mm Number of attempts: 1 Placement Confirmation: breath sounds checked- equal and bilateral,  positive ETCO2 and ETT inserted through vocal cords under direct vision Secured at: 21 cm Tube secured with: Tape Dental Injury: Teeth and Oropharynx as per pre-operative assessment

## 2018-08-29 NOTE — Transfer of Care (Signed)
Immediate Anesthesia Transfer of Care Note  Patient: SUZANE VANDERWEIDE  Procedure(s) Performed: INSERTION PORT-A-CATH WITH ULTRA SOUND ERAS PATHWAY (N/A Chest) EXCISION LEFT BACK  MASS (Left Back)  Patient Location: PACU  Anesthesia Type:General  Level of Consciousness: awake, alert  and oriented  Airway & Oxygen Therapy: Patient Spontanous Breathing and Patient connected to nasal cannula oxygen  Post-op Assessment: Report given to RN, Post -op Vital signs reviewed and stable and Patient moving all extremities X 4  Post vital signs: Reviewed and stable  Last Vitals:  Vitals Value Taken Time  BP 135/62 08/29/2018 10:32 AM  Temp    Pulse 81 08/29/2018 10:32 AM  Resp 22 08/29/2018 10:32 AM  SpO2 100 % 08/29/2018 10:32 AM  Vitals shown include unvalidated device data.  Last Pain:  Vitals:   08/29/18 0732  TempSrc: Oral  PainSc:          Complications: No apparent anesthesia complications

## 2018-08-29 NOTE — Discharge Instructions (Signed)
    PORT-A-CATH: POST OP INSTRUCTIONS  Always review your discharge instruction sheet given to you by the facility where your surgery was performed.   1. A prescription for pain medication may be given to you upon discharge. Take your pain medication as prescribed, if needed. If narcotic pain medicine is not needed, then you make take acetaminophen (Tylenol) or ibuprofen (Advil) as needed.  2. Take your usually prescribed medications unless otherwise directed. 3. If you need a refill on your pain medication, please contact our office. All narcotic pain medicine now requires a paper prescription.  Phoned in and fax refills are no longer allowed by law.  Prescriptions will not be filled after 5 pm or on weekends.  4. You should follow a light diet for the remainder of the day after your procedure. 5. Most patients will experience some mild swelling and/or bruising in the area of the incision. It may take several days to resolve. 6. It is common to experience some constipation if taking pain medication after surgery. Increasing fluid intake and taking a stool softener (such as Colace) will usually help or prevent this problem from occurring. A mild laxative (Milk of Magnesia or Miralax) should be taken according to package directions if there are no bowel movements after 48 hours.  7. Unless discharge instructions indicate otherwise, you may remove your bandages 48 hours after surgery, and you may shower at that time. You may have steri-strips (small white skin tapes) in place directly over the incision.  These strips should be left on the skin for 7-10 days.  If your surgeon used Dermabond (skin glue) on the incision, you may shower in 24 hours.  The glue will flake off over the next 2-3 weeks.  8. If your port is left accessed at the end of surgery (needle left in port), the dressing cannot get wet and should only by changed by a healthcare professional. When the port is no longer accessed (when the  needle has been removed), follow step 7.   9. ACTIVITIES:  Limit activity involving your arms for the next 72 hours. Do no strenuous exercise or activity for 1 week. You may drive when you are no longer taking prescription pain medication, you can comfortably wear a seatbelt, and you can maneuver your car. 10.You may need to see your doctor in the office for a follow-up appointment.  Please       check with your doctor.  11.When you receive a new Port-a-Cath, you will get a product guide and        ID card.  Please keep them in case you need them.  WHEN TO CALL YOUR DOCTOR ([phone]): 1. Fever over 101.0 2. Chills 3. Continued bleeding from incision 4. Increased redness and tenderness at the site 5. Shortness of breath, difficulty breathing   The clinic staff is available to answer your questions during regular business hours. Please don't hesitate to call and ask to speak to one of the nurses or medical assistants for clinical concerns. If you have a medical emergency, go to the nearest emergency room or call 911.  A surgeon from [facility] is always on call at the hospital.     For further information, please visit [email or web address]      

## 2018-08-29 NOTE — Interval H&P Note (Signed)
History and Physical Interval Note:  08/29/2018 8:31 AM  Carrie [name]  has presented today for surgery, with the diagnosis of lymphoma  left back mass  The various methods of treatment have been discussed with the patient and family. After consideration of risks, benefits and other options for treatment, the patient has consented to  Procedure(s): INSERTION PORT-A-CATH WITH ULTRA SOUND ERAS PATHWAY (N/A) EXCISION LEFT BACK  MASS (Left) as a surgical intervention .  The patient's history has been reviewed, patient examined, no change in status, stable for surgery.  I have reviewed the patient's chart and labs.  Questions were answered to the patient's satisfaction.     [place]

## 2018-08-29 NOTE — Op Note (Signed)
preoperative diagnosis: PAC needed  Postoperative diagnosis: Same  Procedure: Portacath Placement with ultrasound and C arm guidance  Excision 3 cm subcutaneous left upper back mass  Surgeon: Turner Daniels, MD, FACS  Anesthesia: General and 0.25 % marcaine with epinephrine  Clinical History and Indications: The patient is getting ready to begin chemotherapy for her cancer. She  needs a Port-A-Cath for venous access. Risk of bleeding, infection,  Collapse lung,  Death,  DVT,  Organ injury,  Mediastinal injury,  Injury to heart,  Injury to blood vessels,  Nerves,  Migration of catheter,  Embolization of catheter and the need for more surgery.  She also required more tissue for definitive diagnosis and a mass was located left upper back that was active on PET scan concerning for recurrent lymphoma.  This was excised for further tissue diagnosis and further treatment planning.The procedure has been discussed with the patient.  Alternative therapies have been discussed with the patient.  Operative risks include bleeding,  Infection,  Organ injury,  Nerve injury,  Blood vessel injury,  DVT,  Pulmonary embolism,  Death,  And possible reoperation.  Medical management risks include worsening of present situation.  The success of the procedure is 50 -90 % at treating patients symptoms.  The patient understands and agrees to proceed.  Description of Procedure: I have seen the patient in the holding area and confirmed the plans for the procedure as noted above. I reviewed the risks and complications again and the patient has no further questions. She wishes to proceed.   The patient was then taken to the operating room. After satisfactory general  anesthesia had been obtained the upper chest and lower neck were prepped and draped as a sterile field. The timeout was done.  The right internal jugular vein  was entered under U/S guidance  and the guidewire threaded into the superior vena cava right atrial  area under fluoroscopic guidance. An incision was then made on the anterior chest wall and a subcutaneous pocket fashioned for the port reservoir.  The port tubing was then brought through a subcutaneous tunnel from the port site to the guidewire site.  The port and catheter were attached, locked  and flushed. The catheter was measured and cut to appropriate length.The dilator and peel-away sheath were then advanced over the guidewire while monitoring this with fluoroscopy. The guidewire and dilator were removed and the tubing threaded to approximately 22 cm. The peel-away sheath was then removed. The catheter aspirated and flushed easily. Using fluoroscopy the tip was in the superior vena cava right atrial junction area. It aspirated and flushed easily. That aspirated and flushed easily.  The reservoir was secured to the fascia with 1 sutures of 2-0 Prolene. A final check with fluoroscopy was done to make sure we had no kinks and good positioning of the tip of the catheter. Everything appeared to be okay. The catheter was aspirated, flushed with dilute heparin and then concentrated aqueous heparin.  The incision was then closed with interrupted 3-0 Vicryl, and 4-0 Monocryl subcuticular with Dermabond on the skin.  There were no operative complications. Estimated blood loss was minimal. All counts were correct. The patient tolerated the procedure well.   The patient was then rolled onto her right side exposing the left upper back region.  This was prepped and draped in sterile fashion.  Timeout was done again to verify proper patient and procedure.  Local anesthetic was infiltrated over her left scapula.  A 3 cm incision was  made dissection was carried down through the subcutaneous tissues until I encountered the palpable subcutaneous nodule which appeared to be lymphoid in nature.  This was excised in its entirety.  This was anterior to the trapezius muscle.  Hemostasis achieved.  This was then closed  with 3-0 Vicryl 4-0 Monocryl.  Dermabond applied.  The patient was then placed back supine extubated taken to recovery in satisfactory condition.   Turner Daniels, MD, FACS

## 2018-08-30 ENCOUNTER — Encounter (HOSPITAL_COMMUNITY): Payer: Self-pay | Admitting: Surgery

## 2018-09-01 ENCOUNTER — Encounter (HOSPITAL_COMMUNITY): Payer: Self-pay | Admitting: Surgery

## 2018-09-04 ENCOUNTER — Inpatient Hospital Stay: Payer: Medicare HMO | Attending: Hematology and Oncology | Admitting: Hematology and Oncology

## 2018-09-04 VITALS — BP 158/96 | HR 87 | Temp 97.4°F | Resp 18 | Ht 61.0 in | Wt 141.9 lb

## 2018-09-04 DIAGNOSIS — Z7689 Persons encountering health services in other specified circumstances: Secondary | ICD-10-CM | POA: Diagnosis not present

## 2018-09-04 DIAGNOSIS — I482 Chronic atrial fibrillation, unspecified: Secondary | ICD-10-CM

## 2018-09-04 DIAGNOSIS — R5383 Other fatigue: Secondary | ICD-10-CM | POA: Diagnosis not present

## 2018-09-04 DIAGNOSIS — Z79899 Other long term (current) drug therapy: Secondary | ICD-10-CM | POA: Diagnosis not present

## 2018-09-04 DIAGNOSIS — Z9221 Personal history of antineoplastic chemotherapy: Secondary | ICD-10-CM

## 2018-09-04 DIAGNOSIS — Z7901 Long term (current) use of anticoagulants: Secondary | ICD-10-CM | POA: Diagnosis not present

## 2018-09-04 DIAGNOSIS — Z5112 Encounter for antineoplastic immunotherapy: Secondary | ICD-10-CM | POA: Diagnosis present

## 2018-09-04 DIAGNOSIS — I700 Atherosclerosis of aorta: Secondary | ICD-10-CM | POA: Insufficient documentation

## 2018-09-04 DIAGNOSIS — Z5111 Encounter for antineoplastic chemotherapy: Secondary | ICD-10-CM | POA: Insufficient documentation

## 2018-09-04 DIAGNOSIS — C8238 Follicular lymphoma grade IIIa, lymph nodes of multiple sites: Secondary | ICD-10-CM | POA: Insufficient documentation

## 2018-09-04 DIAGNOSIS — Z791 Long term (current) use of non-steroidal anti-inflammatories (NSAID): Secondary | ICD-10-CM | POA: Insufficient documentation

## 2018-09-04 MED ORDER — PROCHLORPERAZINE MALEATE 10 MG PO TABS: 10.0000 mg | ORAL_TABLET | Freq: Four times a day (QID) | ORAL | 1 refills | Status: DC | PRN

## 2018-09-04 MED ORDER — LORAZEPAM 0.5 MG PO TABS: 0.5000 mg | ORAL_TABLET | Freq: Four times a day (QID) | ORAL | 0 refills | Status: DC | PRN

## 2018-09-04 MED ORDER — LIDOCAINE-PRILOCAINE 2.5-2.5 % EX CREA: TOPICAL_CREAM | CUTANEOUS | 3 refills | Status: DC

## 2018-09-04 MED ORDER — ALLOPURINOL 300 MG PO TABS: 300.0000 mg | ORAL_TABLET | Freq: Every day | ORAL | 3 refills | Status: DC

## 2018-09-04 MED ORDER — ONDANSETRON HCL 8 MG PO TABS: 8.0000 mg | ORAL_TABLET | Freq: Two times a day (BID) | ORAL | 1 refills | Status: DC | PRN

## 2018-09-04 MED ORDER — ACYCLOVIR 400 MG PO TABS: 400.0000 mg | ORAL_TABLET | Freq: Every day | ORAL | 3 refills | Status: DC

## 2018-09-04 NOTE — Progress Notes (Signed)
Patient Care Team: Reubin Milan, MD as PCP - General (Internal Medicine) Carola Frost, RN as Registered Nurse (Medical Oncology) [name], MD as Attending Physician (Internal Medicine)  DIAGNOSIS:  Encounter Diagnosis  Name Primary?  . Grade 3a follicular lymphoma of lymph nodes of multiple regions ([place])     SUMMARY OF ONCOLOGIC HISTORY:   Grade 3a follicular lymphoma of lymph nodes of multiple regions ([place])   01/21/2015 Surgery    She underwent excisional lymph node biopsy that came back follicular lymphoma grade 3    01/21/2015 Pathology Results    Accession: [id number] biopsy confirmed follicular lymphoma    11/29/9415 Imaging    Echocardiogram showed ejection fraction of 55-60%    02/11/2015 Imaging     PET CT scan show possible splenic involvement and diffuse lymphadenopathy throughout    02/11/2015 Bone Marrow Biopsy     bone marrow biopsy was performed and is involved by lymphoma with translocation of igH/BCL2    02/13/2015 Procedure    She had port placement.    02/17/2015 - 06/02/2015 Chemotherapy    She received R-CHOP chemo x 6    02/17/2015 Adverse Reaction    She had mild infusion reaction with cycle 1 of treatment.    04/18/2015 Imaging    PET CT scan showed near complete response to treatment.    04/22/2015 Adverse Reaction    Vincristine dose was reduced by 50% due to neuropathy from cycle 4 onwards    06/25/2015 - 07/06/2015 Hospital Admission    She was hospitalized for recent sepsis/bacteremia and a fib with RVR    07/11/2015 Imaging    repeat PEt scan showed complete response to Rx    07/14/2015 - 06/16/2017 Chemotherapy    She received maintenance Rituximab every 60 days    12/15/2015 Imaging    PET CT showed no evidence of cancer recurrence    06/15/2016 PET scan    No evidence of active lymphoma on skullbase to thigh FDG PET scan. Small LEFT periaortic retroperitoneal lymph nodes without significant metabolic activity ( Deauville 1). No change  from prior    03/24/2017 Imaging    1. Borderline enlarged abdominal retroperitoneal lymph nodes, stable. No new adenopathy in the chest, abdomen or pelvis. 2. Aortic atherosclerosis (ICD10-170.0). 3. Enlarged pulmonary arteries, indicative of pulmonary arterial hypertension.    09/14/2017 PET scan    Stable exam. No evidence of active lymphoma or other acute findings. (Deauville score 1)    08/08/2018 PET scan    1. Evidence of progressive/recurrent disease, as evidenced by new hypermetabolic nodes and subcutaneous nodule/nodes throughout left chest and abdomen. (Deauville 5). 2. Likely physiologic right nasopharyngeal hypermetabolism. Recommend attention on follow-up. 3. Incidental findings, including pulmonary artery enlargement, suggesting pulmonary arterial hypertension, stable left lower lobe pulmonary nodule, and aortic Atherosclerosis (ICD10-I70.0).    08/29/2018 Initial Biopsy    Soft tissue simple excision left back: Follicular lymphoma grade 1-2 positive for CD20, PAX5, bcl-6, and bcl-2.  Ki-67 30%     CHIEF COMPLIANT: Follow-up to discuss the results of recently performed biopsy of lymph node   INTERVAL HISTORY: Carrie [name] is a 77 year old with above-mentioned history of follicular lymphoma who has relapsed disease and has had multiple subcutaneous nodules.  She underwent an excisional biopsy of 1 of the lymph nodes in the back.  She is here today to discuss the results of the biopsy.  She also had a port placement.  She does complain of moderate fatigue.  Denies  any fevers or chills.  Denies weight loss.  REVIEW OF SYSTEMS:   Constitutional: Denies fevers, chills or abnormal weight loss Eyes: Denies blurriness of vision Ears, nose, mouth, throat, and face: Denies mucositis or sore throat Respiratory: Denies cough, dyspnea or wheezes Cardiovascular: Denies palpitation, chest discomfort Gastrointestinal:  Denies nausea, heartburn or change in bowel habits Skin: Denies  abnormal skin rashes Lymphatics: Multiple subcutaneous nodules and axillary lymph nodes Neurological:Denies numbness, tingling or new weaknesses Behavioral/Psych: Mood is stable, no new changes  Extremities: No lower extremity edema   All other systems were reviewed with the patient and are negative.  I have reviewed the past medical history, past surgical history, social history and family history with the patient and they are unchanged from previous note.  ALLERGIES:  has No Known Allergies.  MEDICATIONS:  Current Outpatient Medications  Medication Sig Dispense Refill  . acetaminophen (TYLENOL) 500 MG tablet Take 500 mg by mouth every 6 (six) hours as needed for moderate pain or headache.    Marland Kitchen amoxicillin (AMOXIL) 500 MG capsule Take 2,000 mg by mouth See admin instructions. Take 2000 mg by mouth 1 hour prior to dental appointment    . apixaban (ELIQUIS) 5 MG TABS tablet Take 5 mg by mouth 2 (two) times daily.     . bisacodyl (DULCOLAX) 5 MG EC tablet Take 5 mg by mouth daily as needed for moderate constipation.    . diclofenac sodium (VOLTAREN) 1 % GEL Apply 2 g topically 3 (three) times daily as needed (knee pain). (Patient taking differently: Apply 2 g topically 3 (three) times daily as needed (for back or knee pain). ) 100 g 0  . ibuprofen (ADVIL,MOTRIN) 800 MG tablet Take 1 tablet (800 mg total) by mouth every 8 (eight) hours as needed. 30 tablet 0  . levothyroxine (SYNTHROID, LEVOTHROID) 112 MCG tablet Take 112 mcg by mouth daily.   9  . oxyCODONE (OXY IR/ROXICODONE) 5 MG immediate release tablet Take 1 tablet (5 mg total) by mouth every 6 (six) hours as needed for severe pain. 10 tablet 0   No current facility-administered medications for this visit.    Facility-Administered Medications Ordered in Other Visits  Medication Dose Route Frequency Provider Last Rate Last Dose  . ondansetron (ZOFRAN) 8 mg in sodium chloride 0.9 % 50 mL IVPB   Intravenous Once Gorsuch, Ni, MD      .  sodium chloride 0.9 % injection 10 mL  10 mL Intravenous PRN Alvy Bimler, Ni, MD   10 mL at 04/05/17 1315    PHYSICAL EXAMINATION: ECOG PERFORMANCE STATUS: 1 - Symptomatic but completely ambulatory  Vitals:   09/04/18 1413  BP: (!) 158/96  Pulse: 87  Resp: 18  Temp: (!) 97.4 F (36.3 C)  SpO2: 100%   Filed Weights   09/04/18 1413  Weight: 141 lb 14.4 oz (64.4 kg)    GENERAL:alert, no distress and comfortable SKIN: skin color, texture, turgor are normal, no rashes or significant lesions EYES: normal, Conjunctiva are pink and non-injected, sclera clear OROPHARYNX:no exudate, no erythema and lips, buccal mucosa, and tongue normal  NECK: Palpable nodules behind the ear and scalp  LUNGS: clear to auscultation and percussion with normal breathing effort HEART: regular rate & rhythm and no murmurs and no lower extremity edema ABDOMEN:abdomen soft, non-tender and normal bowel sounds MUSCULOSKELETAL:no cyanosis of digits and no clubbing  NEURO: alert & oriented x 3 with fluent speech, no focal motor/sensory deficits EXTREMITIES: No lower extremity edema   LABORATORY DATA:  I have reviewed the data as listed CMP Latest Ref Rng & Units 08/24/2018 08/08/2018 04/03/2018  Glucose 70 - 99 mg/dL 89 102(H) 77  BUN 8 - 23 mg/dL 23 24(H) 21  Creatinine 0.44 - 1.00 mg/dL 1.17(H) 1.18(H) 1.16(H)  Sodium 135 - 145 mmol/L 138 145 142  Potassium 3.5 - 5.1 mmol/L 4.4 4.4 4.7  Chloride 98 - 111 mmol/L 103 108 106  CO2 22 - 32 mmol/L '25 26 28  ' Calcium 8.9 - 10.3 mg/dL 9.5 9.5 9.8  Total Protein 6.5 - 8.1 g/dL - 6.7 6.9  Total Bilirubin 0.3 - 1.2 mg/dL - 0.5 0.4  Alkaline Phos 38 - 126 U/L - 89 88  AST 15 - 41 U/L - 15 13  ALT 0 - 44 U/L - 13 9    Lab Results  Component Value Date   WBC 6.7 08/24/2018   HGB 14.0 08/24/2018   HCT 45.9 08/24/2018   MCV 95.8 08/24/2018   PLT 238 08/24/2018   NEUTROABS 3.0 08/08/2018    ASSESSMENT & PLAN:  Grade 3a follicular lymphoma of lymph nodes of  multiple regions ([facility]) Recurrent follicular lymphoma: Cutaneous lesions/signs of recurrence of lymphoma in multiple places The area behind the left scapula has the highest SUV uptake and appears to be the largest  08/29/2018: Biopsy soft tissue simple excision left back: Follicular lymphoma grade 1-2 positive for CD20, PAX5, bcl-6, and bcl-2.  Ki-67 30%  Pathology review: Discussed final pathology report and provided a copy to the patient.  Dr. Alvy Bimler plans on systemic chemotherapy plus/minus immunotherapy I recommended systemic chemotherapy with bendamustine and Rituxan.  The plan is to give her 6 cycles of chemotherapy with bendamustine given on days 1 and 2 every 4 weeks.  She may undergo scans at the end of 3 cycles. Bendamustine counseling: Discussed risks and benefits of bendamustine and she is in understanding and agreeable.  She would like to start chemo as soon as possible.  We will see her back next week to begin her chemotherapy.   No orders of the defined types were placed in this encounter.  The patient has a good understanding of the overall plan. she agrees with it. she will call with any problems that may develop before the next visit here.   Harriette Ohara, MD 09/04/18

## 2018-09-04 NOTE — Progress Notes (Signed)
START ON PATHWAY REGIMEN - Lymphoma and CLL     A cycle is every 28 days:     Bendamustine      Rituximab   **Always confirm dose/schedule in your pharmacy ordering system**  Patient Characteristics: Follicular Lymphoma, Grades 1, 2, and 3A, Second Line, Prior Treatment with Rituximab Alone, Relapse < 24 Months Disease Type: Follicular Lymphoma Disease Type: Not Applicable Disease Type: Not Applicable [place] Stage: IV Tumor Grade: 2 Line of Therapy: Second Line Prior Treatment: Prior Treatment with Rituximab Alone Time to Relapse: Relapse < 24 Months Intent of Therapy: Curative Intent, Discussed with Patient

## 2018-09-04 NOTE — Assessment & Plan Note (Signed)
Recurrent follicular lymphoma: Cutaneous lesions/signs of recurrence of lymphoma in multiple places The area behind the left scapula has the highest SUV uptake and appears to be the largest  08/29/2018: Biopsy soft tissue simple excision left back: Follicular lymphoma grade 1-2 positive for CD20, PAX5, bcl-6, and bcl-2.  Ki-67 30%  Pathology review: Discussed final pathology report and provided a copy to the patient.  Dr. Alvy Bimler plans on systemic chemotherapy plus/minus immunotherapy

## 2018-09-07 ENCOUNTER — Telehealth: Payer: Self-pay

## 2018-09-07 ENCOUNTER — Other Ambulatory Visit: Payer: Self-pay

## 2018-09-07 ENCOUNTER — Telehealth: Payer: Self-pay | Admitting: Hematology and Oncology

## 2018-09-07 DIAGNOSIS — C8238 Follicular lymphoma grade IIIa, lymph nodes of multiple sites: Secondary | ICD-10-CM

## 2018-09-07 MED ORDER — PROCHLORPERAZINE MALEATE 10 MG PO TABS: 10.0000 mg | ORAL_TABLET | Freq: Four times a day (QID) | ORAL | 1 refills | Status: DC | PRN

## 2018-09-07 MED ORDER — LIDOCAINE-PRILOCAINE 2.5-2.5 % EX CREA: TOPICAL_CREAM | CUTANEOUS | 3 refills | Status: DC

## 2018-09-07 MED ORDER — ONDANSETRON HCL 8 MG PO TABS: 8.0000 mg | ORAL_TABLET | Freq: Two times a day (BID) | ORAL | 1 refills | Status: DC | PRN

## 2018-09-07 NOTE — Telephone Encounter (Signed)
Called regarding 11/25

## 2018-09-07 NOTE — Telephone Encounter (Signed)
Patient called back, [facility] pharmacy received medications from provider however they are mail order.  Patient requesting hard scripts for Emla cream, Zofran, and Compazine in order to be able to take to [facility] for pick up.  Prescriptions printed out, will pick up tomorrow 09/08/2018.  No further needs at this time.

## 2018-09-07 NOTE — Telephone Encounter (Signed)
Returned patient's call.  Patient requesting "medication for nausea and numbing cream" be sent to pharmacy.  Medications previously sent, nurse confirmed pharmacy.  Patient will pick up medications. Patient aware to call with any further questions.

## 2018-09-11 ENCOUNTER — Inpatient Hospital Stay: Payer: Medicare HMO

## 2018-09-11 VITALS — BP 145/82 | HR 83 | Temp 98.2°F | Resp 18

## 2018-09-11 DIAGNOSIS — C8238 Follicular lymphoma grade IIIa, lymph nodes of multiple sites: Secondary | ICD-10-CM

## 2018-09-11 DIAGNOSIS — Z5112 Encounter for antineoplastic immunotherapy: Secondary | ICD-10-CM | POA: Diagnosis not present

## 2018-09-11 LAB — CMP (CANCER CENTER ONLY)
ALT: 12 U/L (ref 0–44)
AST: 14 U/L — ABNORMAL LOW (ref 15–41)
Albumin: 3.8 g/dL (ref 3.5–5.0)
Alkaline Phosphatase: 85 U/L (ref 38–126)
Anion gap: 10 (ref 5–15)
BUN: 30 mg/dL — ABNORMAL HIGH (ref 8–23)
CO2: 24 mmol/L (ref 22–32)
Calcium: 9 mg/dL (ref 8.9–10.3)
Chloride: 110 mmol/L (ref 98–111)
Creatinine: 1.03 mg/dL — ABNORMAL HIGH (ref 0.44–1.00)
GFR, Est AFR Am: >60 mL/min (ref >60)
GFR, Estimated: 52 mL/min — ABNORMAL LOW (ref >60)
Glucose, Bld: 89 mg/dL (ref 70–99)
Potassium: 4.2 mmol/L (ref 3.5–5.1)
Sodium: 144 mmol/L (ref 135–145)
Total Bilirubin: 0.5 mg/dL (ref 0.3–1.2)
Total Protein: 6 g/dL — ABNORMAL LOW (ref 6.5–8.1)

## 2018-09-11 LAB — CBC WITH DIFFERENTIAL (CANCER CENTER ONLY)
Abs Immature Granulocytes: 0.02 10*3/uL (ref 0.00–0.07)
Basophils Absolute: 0 10*3/uL (ref 0.0–0.1)
Basophils Relative: 0 %
Eosinophils Absolute: 0.2 10*3/uL (ref 0.0–0.5)
Eosinophils Relative: 3 %
HCT: 40.5 % (ref 36.0–46.0)
Hemoglobin: 13 g/dL (ref 12.0–15.0)
Immature Granulocytes: 0 %
Lymphocytes Relative: 12 %
Lymphs Abs: 0.7 10*3/uL (ref 0.7–4.0)
MCH: 30.2 pg (ref 26.0–34.0)
MCHC: 32.1 g/dL (ref 30.0–36.0)
MCV: 94 fL (ref 80.0–100.0)
Monocytes Absolute: 0.7 10*3/uL (ref 0.1–1.0)
Monocytes Relative: 11 %
Neutro Abs: 4.2 10*3/uL (ref 1.7–7.7)
Neutrophils Relative %: 74 %
Platelet Count: 206 10*3/uL (ref 150–400)
RBC: 4.31 MIL/uL (ref 3.87–5.11)
RDW: 12.6 % (ref 11.5–15.5)
WBC Count: 5.7 10*3/uL (ref 4.0–10.5)
nRBC: 0 % (ref 0.0–0.2)

## 2018-09-11 MED ORDER — SODIUM CHLORIDE 0.9 % IV SOLN
Freq: Once | INTRAVENOUS | Status: AC
  Administered 2018-09-11: 09:00:00 via INTRAVENOUS
  Filled 2018-09-11: qty 250

## 2018-09-11 MED ORDER — SODIUM CHLORIDE 0.9 % IV SOLN
375.0000 mg/m2 | Freq: Once | INTRAVENOUS | Status: AC
  Administered 2018-09-11: 600 mg via INTRAVENOUS
  Filled 2018-09-11: qty 10

## 2018-09-11 MED ORDER — DEXAMETHASONE SODIUM PHOSPHATE 10 MG/ML IJ SOLN
INTRAMUSCULAR | Status: AC
  Filled 2018-09-11: qty 1

## 2018-09-11 MED ORDER — SODIUM CHLORIDE 0.9 % IV SOLN
90.0000 mg/m2 | Freq: Once | INTRAVENOUS | Status: AC
  Administered 2018-09-11: 150 mg via INTRAVENOUS
  Filled 2018-09-11: qty 6

## 2018-09-11 MED ORDER — DEXAMETHASONE SODIUM PHOSPHATE 10 MG/ML IJ SOLN
10.0000 mg | Freq: Once | INTRAMUSCULAR | Status: AC
  Administered 2018-09-11: 10 mg via INTRAVENOUS

## 2018-09-11 MED ORDER — DIPHENHYDRAMINE HCL 25 MG PO CAPS
50.0000 mg | ORAL_CAPSULE | Freq: Once | ORAL | Status: AC
  Administered 2018-09-11: 50 mg via ORAL

## 2018-09-11 MED ORDER — PALONOSETRON HCL INJECTION 0.25 MG/5ML
INTRAVENOUS | Status: AC
  Filled 2018-09-11: qty 5

## 2018-09-11 MED ORDER — PALONOSETRON HCL INJECTION 0.25 MG/5ML
0.2500 mg | Freq: Once | INTRAVENOUS | Status: AC
  Administered 2018-09-11: 0.25 mg via INTRAVENOUS

## 2018-09-11 MED ORDER — ACETAMINOPHEN 325 MG PO TABS
650.0000 mg | ORAL_TABLET | Freq: Once | ORAL | Status: AC
  Administered 2018-09-11: 650 mg via ORAL

## 2018-09-11 MED ORDER — HEPARIN SOD (PORK) LOCK FLUSH 100 UNIT/ML IV SOLN
500.0000 [IU] | Freq: Once | INTRAVENOUS | Status: AC | PRN
  Administered 2018-09-11: 500 [IU]
  Filled 2018-09-11: qty 5

## 2018-09-11 MED ORDER — DIPHENHYDRAMINE HCL 25 MG PO CAPS
ORAL_CAPSULE | ORAL | Status: AC
  Filled 2018-09-11: qty 2

## 2018-09-11 MED ORDER — ACETAMINOPHEN 325 MG PO TABS
ORAL_TABLET | ORAL | Status: AC
  Filled 2018-09-11: qty 2

## 2018-09-11 MED ORDER — SODIUM CHLORIDE 0.9% FLUSH
10.0000 mL | INTRAVENOUS | Status: DC | PRN
  Administered 2018-09-11: 10 mL
  Filled 2018-09-11: qty 10

## 2018-09-11 NOTE — Patient Instructions (Signed)
[facility] Discharge Instructions for Patients Receiving Chemotherapy  Today you received the following chemotherapy agents :Rituxan and Bendamustine. To help prevent nausea and vomiting after your treatment, we encourage you to take your nausea medication as directed.   If you develop nausea and vomiting that is not controlled by your nausea medication, call the clinic.   BELOW ARE SYMPTOMS THAT SHOULD BE REPORTED IMMEDIATELY:  *FEVER GREATER THAN 100.5 F  *CHILLS WITH OR WITHOUT FEVER  NAUSEA AND VOMITING THAT IS NOT CONTROLLED WITH YOUR NAUSEA MEDICATION  *UNUSUAL SHORTNESS OF BREATH  *UNUSUAL BRUISING OR BLEEDING  TENDERNESS IN MOUTH AND THROAT WITH OR WITHOUT PRESENCE OF ULCERS  *URINARY PROBLEMS  *BOWEL PROBLEMS  UNUSUAL RASH Items with * indicate a potential emergency and should be followed up as soon as possible.  Feel free to call the clinic you have any questions or concerns. The clinic phone number is [phone].  Please show the [facility] at check-in to the Emergency Department and triage nurse.  Rituximab injection What is this medicine? RITUXIMAB (ri TUX i mab) is a monoclonal antibody. It is used to treat certain types of cancer like non-Hodgkin lymphoma and chronic lymphocytic leukemia. It is also used to treat rheumatoid arthritis, granulomatosis with polyangiitis (or Wegener's granulomatosis), and microscopic polyangiitis. This medicine may be used for other purposes; ask your health care provider or pharmacist if you have questions. COMMON BRAND NAME(S): Rituxan What should I tell my health care provider before I take this medicine? They need to know if you have any of these conditions: -heart disease -infection (especially a virus infection such as hepatitis B, chickenpox, cold sores, or herpes) -immune system problems -irregular heartbeat -kidney disease -lung or breathing disease, like asthma -recently received or scheduled  to receive a vaccine -an unusual or allergic reaction to rituximab, mouse proteins, other medicines, foods, dyes, or preservatives -pregnant or trying to get pregnant -breast-feeding How should I use this medicine? This medicine is for infusion into a vein. It is administered in a hospital or clinic by a specially trained health care professional. A special MedGuide will be given to you by the pharmacist with each prescription and refill. Be sure to read this information carefully each time. Talk to your pediatrician regarding the use of this medicine in children. This medicine is not approved for use in children. Overdosage: If you think you have taken too much of this medicine contact a poison control center or emergency room at once. NOTE: This medicine is only for you. Do not share this medicine with others. What if I miss a dose? It is important not to miss a dose. Call your doctor or health care professional if you are unable to keep an appointment. What may interact with this medicine? -cisplatin -other medicines for arthritis like disease modifying antirheumatic drugs or tumor necrosis factor inhibitors -live virus vaccines This list may not describe all possible interactions. Give your health care provider a list of all the medicines, herbs, non-prescription drugs, or dietary supplements you use. Also tell them if you smoke, drink alcohol, or use illegal drugs. Some items may interact with your medicine. What should I watch for while using this medicine? Your condition will be monitored carefully while you are receiving this medicine. You may need blood work done while you are taking this medicine. This medicine can cause serious allergic reactions. To reduce your risk you may need to take medicine before treatment with this medicine. Take your medicine  as directed. In some patients, this medicine may cause a serious brain infection that may cause death. If you have any problems seeing,  thinking, speaking, walking, or standing, tell your doctor right away. If you cannot reach your doctor, urgently seek other source of medical care. Call your doctor or health care professional for advice if you get a fever, chills or sore throat, or other symptoms of a cold or flu. Do not treat yourself. This drug decreases your body's ability to fight infections. Try to avoid being around people who are sick. Do not become pregnant while taking this medicine or for 12 months after stopping it. Women should inform their doctor if they wish to become pregnant or think they might be pregnant. There is a potential for serious side effects to an unborn child. Talk to your health care professional or pharmacist for more information. What side effects may I notice from receiving this medicine? Side effects that you should report to your doctor or health care professional as soon as possible: -breathing problems -chest pain -dizziness or feeling faint -fast, irregular heartbeat -low blood counts - this medicine may decrease the number of white blood cells, red blood cells and platelets. You may be at increased risk for infections and bleeding. -mouth sores -redness, blistering, peeling or loosening of the skin, including inside the mouth (this can be added for any serious or exfoliative rash that could lead to hospitalization) -signs of infection - fever or chills, cough, sore throat, pain or difficulty passing urine -signs and symptoms of kidney injury like trouble passing urine or change in the amount of urine -signs and symptoms of liver injury like dark yellow or brown urine; general ill feeling or flu-like symptoms; light-colored stools; loss of appetite; nausea; right upper belly pain; unusually weak or tired; yellowing of the eyes or skin -stomach pain -vomiting Side effects that usually do not require medical attention (report to your doctor or health care professional if they continue or are  bothersome): -headache -joint pain -muscle cramps or muscle pain This list may not describe all possible side effects. Call your doctor for medical advice about side effects. You may report side effects to FDA at 1-800-FDA-1088. Where should I keep my medicine? This drug is given in a hospital or clinic and will not be stored at home. NOTE: This sheet is a summary. It may not cover all possible information. If you have questions about this medicine, talk to your doctor, pharmacist, or health care provider.  2018 Elsevier/Gold Standard (2016-05-12 15:28:09) Bendamustine Injection What is this medicine? BENDAMUSTINE (BEN da MUS teen) is a chemotherapy drug. It is used to treat chronic lymphocytic leukemia and non-Hodgkin lymphoma. This medicine may be used for other purposes; ask your health care provider or pharmacist if you have questions. COMMON BRAND NAME(S): BENDEKA, Treanda What should I tell my health care provider before I take this medicine? They need to know if you have any of these conditions: -infection (especially a virus infection such as chickenpox, cold sores, or herpes) -kidney disease -liver disease -an unusual or allergic reaction to bendamustine, mannitol, other medicines, foods, dyes, or preservatives -pregnant or trying to get pregnant -breast-feeding How should I use this medicine? This medicine is for infusion into a vein. It is given by a health care professional in a hospital or clinic setting. Talk to your pediatrician regarding the use of this medicine in children. Special care may be needed. Overdosage: If you think you have taken too much  of this medicine contact a poison control center or emergency room at once. NOTE: This medicine is only for you. Do not share this medicine with others. What if I miss a dose? It is important not to miss your dose. Call your doctor or health care professional if you are unable to keep an appointment. What may interact with  this medicine? Do not take this medicine with any of the following medications: -clozapine This medicine may also interact with the following medications: -atazanavir -cimetidine -ciprofloxacin -enoxacin -fluvoxamine -medicines for seizures like carbamazepine and phenobarbital -mexiletine -rifampin -tacrine -thiabendazole -zileuton This list may not describe all possible interactions. Give your health care provider a list of all the medicines, herbs, non-prescription drugs, or dietary supplements you use. Also tell them if you smoke, drink alcohol, or use illegal drugs. Some items may interact with your medicine. What should I watch for while using this medicine? This drug may make you feel generally unwell. This is not uncommon, as chemotherapy can affect healthy cells as well as cancer cells. Report any side effects. Continue your course of treatment even though you feel ill unless your doctor tells you to stop. You may need blood work done while you are taking this medicine. Call your doctor or health care professional for advice if you get a fever, chills or sore throat, or other symptoms of a cold or flu. Do not treat yourself. This drug decreases your body's ability to fight infections. Try to avoid being around people who are sick. This medicine may increase your risk to bruise or bleed. Call your doctor or health care professional if you notice any unusual bleeding. Talk to your doctor about your risk of cancer. You may be more at risk for certain types of cancers if you take this medicine. Do not become pregnant while taking this medicine or for 3 months after stopping it. Women should inform their doctor if they wish to become pregnant or think they might be pregnant. Men should not father a child while taking this medicine and for 3 months after stopping it.There is a potential for serious side effects to an unborn child. Talk to your health care professional or pharmacist for more  information. Do not breast-feed an infant while taking this medicine. This medicine may interfere with the ability to have a child. You should talk with your doctor or health care professional if you are concerned about your fertility. What side effects may I notice from receiving this medicine? Side effects that you should report to your doctor or health care professional as soon as possible: -allergic reactions like skin rash, itching or hives, swelling of the face, lips, or tongue -low blood counts - this medicine may decrease the number of white blood cells, red blood cells and platelets. You may be at increased risk for infections and bleeding. -redness, blistering, peeling or loosening of the skin, including inside the mouth -signs of infection - fever or chills, cough, sore throat, pain or difficulty passing urine -signs of decreased platelets or bleeding - bruising, pinpoint red spots on the skin, black, tarry stools, blood in the urine -signs of decreased red blood cells - unusually weak or tired, fainting spells, lightheadedness -signs and symptoms of kidney injury like trouble passing urine or change in the amount of urine -signs and symptoms of liver injury like dark yellow or brown urine; general ill feeling or flu-like symptoms; light-colored stools; loss of appetite; nausea; right upper belly pain; unusually weak or tired; yellowing  of the eyes or skin Side effects that usually do not require medical attention (report to your doctor or health care professional if they continue or are bothersome): -constipation -decreased appetite -diarrhea -headache -mouth sores -nausea/vomiting -tiredness This list may not describe all possible side effects. Call your doctor for medical advice about side effects. You may report side effects to FDA at 1-800-FDA-1088. Where should I keep my medicine? This drug is given in a hospital or clinic and will not be stored at home. NOTE: This sheet is a  summary. It may not cover all possible information. If you have questions about this medicine, talk to your doctor, pharmacist, or health care provider.  2018 Elsevier/Gold Standard (2015-08-07 08:45:41)

## 2018-09-12 ENCOUNTER — Inpatient Hospital Stay: Payer: Medicare HMO

## 2018-09-12 VITALS — BP 142/82 | HR 89 | Temp 98.2°F | Resp 18 | Wt 143.8 lb

## 2018-09-12 DIAGNOSIS — C8238 Follicular lymphoma grade IIIa, lymph nodes of multiple sites: Secondary | ICD-10-CM

## 2018-09-12 DIAGNOSIS — Z5112 Encounter for antineoplastic immunotherapy: Secondary | ICD-10-CM | POA: Diagnosis not present

## 2018-09-12 MED ORDER — PEGFILGRASTIM 6 MG/0.6ML ~~LOC~~ PSKT
PREFILLED_SYRINGE | SUBCUTANEOUS | Status: AC
  Filled 2018-09-12: qty 0.6

## 2018-09-12 MED ORDER — PEGFILGRASTIM 6 MG/0.6ML ~~LOC~~ PSKT
6.0000 mg | PREFILLED_SYRINGE | Freq: Once | SUBCUTANEOUS | Status: AC
  Administered 2018-09-12: 6 mg via SUBCUTANEOUS

## 2018-09-12 MED ORDER — SODIUM CHLORIDE 0.9 % IV SOLN
90.0000 mg/m2 | Freq: Once | INTRAVENOUS | Status: AC
  Administered 2018-09-12: 150 mg via INTRAVENOUS
  Filled 2018-09-12: qty 6

## 2018-09-12 MED ORDER — HEPARIN SOD (PORK) LOCK FLUSH 100 UNIT/ML IV SOLN
500.0000 [IU] | Freq: Once | INTRAVENOUS | Status: AC | PRN
  Administered 2018-09-12: 500 [IU]
  Filled 2018-09-12: qty 5

## 2018-09-12 MED ORDER — DEXAMETHASONE SODIUM PHOSPHATE 10 MG/ML IJ SOLN
INTRAMUSCULAR | Status: AC
  Filled 2018-09-12: qty 1

## 2018-09-12 MED ORDER — SODIUM CHLORIDE 0.9% FLUSH
10.0000 mL | INTRAVENOUS | Status: DC | PRN
  Administered 2018-09-12: 10 mL
  Filled 2018-09-12: qty 10

## 2018-09-12 MED ORDER — SODIUM CHLORIDE 0.9 % IV SOLN
Freq: Once | INTRAVENOUS | Status: AC
  Administered 2018-09-12: 15:00:00 via INTRAVENOUS
  Filled 2018-09-12: qty 250

## 2018-09-12 MED ORDER — DEXAMETHASONE SODIUM PHOSPHATE 10 MG/ML IJ SOLN
10.0000 mg | Freq: Once | INTRAMUSCULAR | Status: AC
  Administered 2018-09-12: 10 mg via INTRAVENOUS

## 2018-09-12 NOTE — Patient Instructions (Signed)
[facility] Discharge Instructions for Patients Receiving Chemotherapy  Today you received the following chemotherapy agents Bendamustine (BENDEKA).  To help prevent nausea and vomiting after your treatment, we encourage you to take your nausea medication as prescribed.   If you develop nausea and vomiting that is not controlled by your nausea medication, call the clinic.   BELOW ARE SYMPTOMS THAT SHOULD BE REPORTED IMMEDIATELY:  *FEVER GREATER THAN 100.5 F  *CHILLS WITH OR WITHOUT FEVER  NAUSEA AND VOMITING THAT IS NOT CONTROLLED WITH YOUR NAUSEA MEDICATION  *UNUSUAL SHORTNESS OF BREATH  *UNUSUAL BRUISING OR BLEEDING  TENDERNESS IN MOUTH AND THROAT WITH OR WITHOUT PRESENCE OF ULCERS  *URINARY PROBLEMS  *BOWEL PROBLEMS  UNUSUAL RASH Items with * indicate a potential emergency and should be followed up as soon as possible.  Feel free to call the clinic should you have any questions or concerns. The clinic phone number is [phone].  Please show the CHEMO ALERT CARD at check-in to the Emergency Department and triage nurse.   

## 2018-09-13 ENCOUNTER — Telehealth: Payer: Self-pay | Admitting: *Deleted

## 2018-09-13 NOTE — Telephone Encounter (Signed)
TCT patient to follow up on her first chemo with Rituxan and bendamustine. Spoke with patient. She states she has had a little nausea but got relief with compazine. She has been able to rest. No fever/vomiting diarrhea etc.  Encouraged pt to push fluids as tolerated and eat non spicy foods etc, whatever seems good to her. Pt voiced understanding, stated family member would be bringing some Thanksgiving dinner tomorrow.  She understands to call with any questions or concerns.

## 2018-09-15 ENCOUNTER — Inpatient Hospital Stay: Payer: Medicare HMO

## 2018-09-15 NOTE — Progress Notes (Signed)
ONPRO was "not working" per patient. It was assessed by nurse and it was actually empty and all done . IT was placed 11/26 at 4pm and was then due to be done 11/27 in the evening. IT left no reaction to the skin or discomfort. I removed it and talked to patient and spouse. She feels more tired than normal and I offered her to be seen in [facility] but they declined. I told her the ONPRO can make her feel more tired the more she gets it and that if she grows more tired or weak or runs a temp or is not eating than she needs to go to ED. They agreed.

## 2018-09-19 ENCOUNTER — Inpatient Hospital Stay: Payer: No Typology Code available for payment source

## 2018-09-19 ENCOUNTER — Telehealth: Payer: Self-pay | Admitting: Hematology and Oncology

## 2018-09-19 ENCOUNTER — Inpatient Hospital Stay
Payer: No Typology Code available for payment source | Attending: Hematology and Oncology | Admitting: Hematology and Oncology

## 2018-09-19 DIAGNOSIS — R42 Dizziness and giddiness: Secondary | ICD-10-CM | POA: Insufficient documentation

## 2018-09-19 DIAGNOSIS — C8238 Follicular lymphoma grade IIIa, lymph nodes of multiple sites: Secondary | ICD-10-CM

## 2018-09-19 DIAGNOSIS — K2100 Gastro-esophageal reflux disease with esophagitis, without bleeding: Secondary | ICD-10-CM | POA: Insufficient documentation

## 2018-09-19 DIAGNOSIS — R531 Weakness: Secondary | ICD-10-CM | POA: Diagnosis not present

## 2018-09-19 DIAGNOSIS — R509 Fever, unspecified: Secondary | ICD-10-CM | POA: Diagnosis not present

## 2018-09-19 DIAGNOSIS — N183 Chronic kidney disease, stage 3 (moderate): Secondary | ICD-10-CM | POA: Diagnosis not present

## 2018-09-19 DIAGNOSIS — Z79899 Other long term (current) drug therapy: Secondary | ICD-10-CM | POA: Diagnosis not present

## 2018-09-19 DIAGNOSIS — E039 Hypothyroidism, unspecified: Secondary | ICD-10-CM | POA: Insufficient documentation

## 2018-09-19 DIAGNOSIS — Z5111 Encounter for antineoplastic chemotherapy: Secondary | ICD-10-CM | POA: Insufficient documentation

## 2018-09-19 DIAGNOSIS — D72823 Leukemoid reaction: Secondary | ICD-10-CM | POA: Diagnosis not present

## 2018-09-19 DIAGNOSIS — R232 Flushing: Secondary | ICD-10-CM | POA: Diagnosis not present

## 2018-09-19 DIAGNOSIS — Z7901 Long term (current) use of anticoagulants: Secondary | ICD-10-CM | POA: Insufficient documentation

## 2018-09-19 DIAGNOSIS — R5383 Other fatigue: Secondary | ICD-10-CM | POA: Diagnosis not present

## 2018-09-19 DIAGNOSIS — Z5112 Encounter for antineoplastic immunotherapy: Secondary | ICD-10-CM | POA: Diagnosis not present

## 2018-09-19 DIAGNOSIS — R0902 Hypoxemia: Secondary | ICD-10-CM | POA: Diagnosis not present

## 2018-09-19 DIAGNOSIS — Z7689 Persons encountering health services in other specified circumstances: Secondary | ICD-10-CM | POA: Insufficient documentation

## 2018-09-19 DIAGNOSIS — I959 Hypotension, unspecified: Secondary | ICD-10-CM | POA: Diagnosis not present

## 2018-09-19 DIAGNOSIS — I129 Hypertensive chronic kidney disease with stage 1 through stage 4 chronic kidney disease, or unspecified chronic kidney disease: Secondary | ICD-10-CM | POA: Diagnosis not present

## 2018-09-19 DIAGNOSIS — I10 Essential (primary) hypertension: Secondary | ICD-10-CM

## 2018-09-19 DIAGNOSIS — I482 Chronic atrial fibrillation, unspecified: Secondary | ICD-10-CM | POA: Diagnosis not present

## 2018-09-19 MED ORDER — OMEPRAZOLE 40 MG PO CPDR: 40.0000 mg | DELAYED_RELEASE_CAPSULE | Freq: Every day | ORAL | 1 refills | Status: DC

## 2018-09-19 NOTE — Telephone Encounter (Signed)
Gave avs and calendar ° °

## 2018-09-19 NOTE — Assessment & Plan Note (Signed)
She tolerated treatment well without major side effects I recommend minimum 3 cycles of treatment before repeat imaging study

## 2018-09-20 ENCOUNTER — Encounter: Payer: Self-pay | Admitting: Hematology and Oncology

## 2018-09-20 ENCOUNTER — Telehealth: Payer: Self-pay

## 2018-09-20 NOTE — Assessment & Plan Note (Signed)
Her blood pressure is mildly elevated but could be due to anxiety I reassured the patient and recommend her to continue her medications as directed 

## 2018-09-20 NOTE — Telephone Encounter (Signed)
She requested and letter be faxed to Marcello Fennel at [facility] in [place], [place] at [phone] stating that she was seen at [facility] on 11/18, 11/25 and 09/12/2018. Per Dr. Alvy Bimler letter faxed.

## 2018-09-20 NOTE — Assessment & Plan Note (Signed)
She has symptoms of heartburn I recommend discontinuation of nausea medicine and switch to omeprazole in the morning, Zantac in the evening and Tums as needed

## 2018-09-20 NOTE — Progress Notes (Signed)
[facility] OFFICE PROGRESS NOTE  Patient Care Team: Reubin Milan, MD as PCP - General (Internal Medicine) Carola Frost, RN as Registered Nurse (Medical Oncology) [name], MD as Attending Physician (Internal Medicine)  ASSESSMENT & PLAN:  Grade 3a follicular lymphoma of lymph nodes of multiple regions [facility]) She tolerated treatment well without major side effects I recommend minimum 3 cycles of treatment before repeat imaging study   GERD with esophagitis She has symptoms of heartburn I recommend discontinuation of nausea medicine and switch to omeprazole in the morning, Zantac in the evening and Tums as needed  Essential hypertension Her blood pressure is mildly elevated but could be due to anxiety I reassured the patient and recommend her to continue her medications as directed  Generalized weakness Patient has chronic fatigue and generalized weakness and she perceived that more so after recent chemotherapy I recommend graduated exercise as tolerated If she tolerate for cycle of chemotherapy well, I will refer her to physical therapy for rehab   No orders of the defined types were placed in this encounter.   INTERVAL HISTORY: Please see below for problem oriented charting. She returns with her husband for further follow-up after recent chemotherapy She denies nausea but complained of heartburn and indigestion with occasional headaches She has been taking regular Zofran She also complained of fatigue and weakness Previously palpable lump on her back has resolved since chemotherapy She denies recent infection, fever or chills  SUMMARY OF ONCOLOGIC HISTORY:   Grade 3a follicular lymphoma of lymph nodes of multiple regions ([place])   01/21/2015 Surgery    She underwent excisional lymph node biopsy that came back follicular lymphoma grade 3    01/21/2015 Pathology Results    Accession: [id number] biopsy confirmed follicular lymphoma    05/15/210  Imaging    Echocardiogram showed ejection fraction of 55-60%    02/11/2015 Imaging     PET CT scan show possible splenic involvement and diffuse lymphadenopathy throughout    02/11/2015 Bone Marrow Biopsy     bone marrow biopsy was performed and is involved by lymphoma with translocation of igH/BCL2    02/13/2015 Procedure    She had port placement.    02/17/2015 - 06/02/2015 Chemotherapy    She received R-CHOP chemo x 6    02/17/2015 Adverse Reaction    She had mild infusion reaction with cycle 1 of treatment.    04/18/2015 Imaging    PET CT scan showed near complete response to treatment.    04/22/2015 Adverse Reaction    Vincristine dose was reduced by 50% due to neuropathy from cycle 4 onwards    06/25/2015 - 07/06/2015 Hospital Admission    She was hospitalized for recent sepsis/bacteremia and a fib with RVR    07/11/2015 Imaging    repeat PEt scan showed complete response to Rx    07/14/2015 - 06/16/2017 Chemotherapy    She received maintenance Rituximab every 60 days    12/15/2015 Imaging    PET CT showed no evidence of cancer recurrence    06/15/2016 PET scan    No evidence of active lymphoma on skullbase to thigh FDG PET scan. Small LEFT periaortic retroperitoneal lymph nodes without significant metabolic activity ( Deauville 1). No change from prior    03/24/2017 Imaging    1. Borderline enlarged abdominal retroperitoneal lymph nodes, stable. No new adenopathy in the chest, abdomen or pelvis. 2. Aortic atherosclerosis (ICD10-170.0). 3. Enlarged pulmonary arteries, indicative of pulmonary arterial hypertension.    09/14/2017  PET scan    Stable exam. No evidence of active lymphoma or other acute findings. (Deauville score 1)    08/08/2018 PET scan    1. Evidence of progressive/recurrent disease, as evidenced by new hypermetabolic nodes and subcutaneous nodule/nodes throughout left chest and abdomen. (Deauville 5). 2. Likely physiologic right nasopharyngeal  hypermetabolism. Recommend attention on follow-up. 3. Incidental findings, including pulmonary artery enlargement, suggesting pulmonary arterial hypertension, stable left lower lobe pulmonary nodule, and aortic Atherosclerosis (ICD10-I70.0).    08/29/2018 Initial Biopsy    Soft tissue simple excision left back: Follicular lymphoma grade 1-2 positive for CD20, PAX5, bcl-6, and bcl-2.  Ki-67 30%    09/04/2018 -  Chemotherapy    The patient had Bendamustine and Rituximab     REVIEW OF SYSTEMS:   Constitutional: Denies fevers, chills or abnormal weight loss Eyes: Denies blurriness of vision Ears, nose, mouth, throat, and face: Denies mucositis or sore throat Respiratory: Denies cough, dyspnea or wheezes Cardiovascular: Denies palpitation, chest discomfort or lower extremity swelling Skin: Denies abnormal skin rashes Lymphatics: Denies new lymphadenopathy or easy bruising Behavioral/Psych: Mood is stable, no new changes  All other systems were reviewed with the patient and are negative.  I have reviewed the past medical history, past surgical history, social history and family history with the patient and they are unchanged from previous note.  ALLERGIES:  has No Known Allergies.  MEDICATIONS:  Current Outpatient Medications  Medication Sig Dispense Refill  . acetaminophen (TYLENOL) 500 MG tablet Take 500 mg by mouth every 6 (six) hours as needed for moderate pain or headache.    Marland Kitchen acyclovir (ZOVIRAX) 400 MG tablet Take 1 tablet (400 mg total) by mouth daily. 30 tablet 3  . allopurinol (ZYLOPRIM) 300 MG tablet Take 1 tablet (300 mg total) by mouth daily. 30 tablet 3  . amoxicillin (AMOXIL) 500 MG capsule Take 2,000 mg by mouth See admin instructions. Take 2000 mg by mouth 1 hour prior to dental appointment    . apixaban (ELIQUIS) 5 MG TABS tablet Take 5 mg by mouth 2 (two) times daily.     . bisacodyl (DULCOLAX) 5 MG EC tablet Take 5 mg by mouth daily as needed for moderate  constipation.    . diclofenac sodium (VOLTAREN) 1 % GEL Apply 2 g topically 3 (three) times daily as needed (knee pain). (Patient taking differently: Apply 2 g topically 3 (three) times daily as needed (for back or knee pain). ) 100 g 0  . levothyroxine (SYNTHROID, LEVOTHROID) 112 MCG tablet Take 112 mcg by mouth daily.   9  . lidocaine-prilocaine (EMLA) cream Apply to affected area once 30 g 3  . LORazepam (ATIVAN) 0.5 MG tablet Take 1 tablet (0.5 mg total) by mouth every 6 (six) hours as needed (Nausea or vomiting). 30 tablet 0  . omeprazole (PRILOSEC) 40 MG capsule Take 1 capsule (40 mg total) by mouth daily. 90 capsule 1  . ondansetron (ZOFRAN) 8 MG tablet Take 1 tablet (8 mg total) by mouth 2 (two) times daily as needed for refractory nausea / vomiting. Start on day 2 after bendamustine chemotherapy. 30 tablet 1  . oxyCODONE (OXY IR/ROXICODONE) 5 MG immediate release tablet Take 1 tablet (5 mg total) by mouth every 6 (six) hours as needed for severe pain. 10 tablet 0  . prochlorperazine (COMPAZINE) 10 MG tablet Take 1 tablet (10 mg total) by mouth every 6 (six) hours as needed (Nausea or vomiting). 30 tablet 1   No current facility-administered medications for  this visit.    Facility-Administered Medications Ordered in Other Visits  Medication Dose Route Frequency Provider Last Rate Last Dose  . ondansetron (ZOFRAN) 8 mg in sodium chloride 0.9 % 50 mL IVPB   Intravenous Once Djimon Lundstrom, MD      . sodium chloride 0.9 % injection 10 mL  10 mL Intravenous PRN Alvy Bimler, Adriann Ballweg, MD   10 mL at 04/05/17 1315    PHYSICAL EXAMINATION: ECOG PERFORMANCE STATUS: 1 - Symptomatic but completely ambulatory  Vitals:   09/19/18 0905  BP: (!) 147/80  Pulse: 81  Resp: 18  SpO2: 100%   Filed Weights   09/19/18 0905  Weight: 143 lb 3.2 oz (65 kg)    GENERAL:alert, no distress and comfortable SKIN: skin color, texture, turgor are normal, no rashes or significant lesions EYES: normal, Conjunctiva are  pink and non-injected, sclera clear OROPHARYNX:no exudate, no erythema and lips, buccal mucosa, and tongue normal  NECK: supple, thyroid normal size, non-tender, without nodularity LYMPH:  no palpable lymphadenopathy in the cervical, axillary or inguinal LUNGS: clear to auscultation and percussion with normal breathing effort HEART: regular rate & rhythm and no murmurs and no lower extremity edema ABDOMEN:abdomen soft, non-tender and normal bowel sounds Musculoskeletal:no cyanosis of digits and no clubbing  NEURO: alert & oriented x 3 with fluent speech, no focal motor/sensory deficits  LABORATORY DATA:  I have reviewed the data as listed    Component Value Date/Time   NA 144 09/11/2018 0804   NA 142 08/29/2017 0838   K 4.2 09/11/2018 0804   K 4.2 08/29/2017 0838   CL 110 09/11/2018 0804   CO2 24 09/11/2018 0804   CO2 23 08/29/2017 0838   GLUCOSE 89 09/11/2018 0804   GLUCOSE 97 08/29/2017 0838   BUN 30 (H) 09/11/2018 0804   BUN 24.0 08/29/2017 0838   CREATININE 1.03 (H) 09/11/2018 0804   CREATININE 1.0 08/29/2017 0838   CALCIUM 9.0 09/11/2018 0804   CALCIUM 8.9 08/29/2017 0838   PROT 6.0 (L) 09/11/2018 0804   PROT 6.4 08/29/2017 0838   ALBUMIN 3.8 09/11/2018 0804   ALBUMIN 3.8 08/29/2017 0838   AST 14 (L) 09/11/2018 0804   AST 18 08/29/2017 0838   ALT 12 09/11/2018 0804   ALT 16 08/29/2017 0838   ALKPHOS 85 09/11/2018 0804   ALKPHOS 93 08/29/2017 0838   BILITOT 0.5 09/11/2018 0804   BILITOT 0.33 08/29/2017 0838   GFRNONAA 52 (L) 09/11/2018 0804   GFRAA >60 09/11/2018 0804    No results found for: SPEP, UPEP  Lab Results  Component Value Date   WBC 5.7 09/11/2018   NEUTROABS 4.2 09/11/2018   HGB 13.0 09/11/2018   HCT 40.5 09/11/2018   MCV 94.0 09/11/2018   PLT 206 09/11/2018      Chemistry      Component Value Date/Time   NA 144 09/11/2018 0804   NA 142 08/29/2017 0838   K 4.2 09/11/2018 0804   K 4.2 08/29/2017 0838   CL 110 09/11/2018 0804   CO2 24  09/11/2018 0804   CO2 23 08/29/2017 0838   BUN 30 (H) 09/11/2018 0804   BUN 24.0 08/29/2017 0838   CREATININE 1.03 (H) 09/11/2018 0804   CREATININE 1.0 08/29/2017 0838      Component Value Date/Time   CALCIUM 9.0 09/11/2018 0804   CALCIUM 8.9 08/29/2017 0838   ALKPHOS 85 09/11/2018 0804   ALKPHOS 93 08/29/2017 0838   AST 14 (L) 09/11/2018 0804   AST 18 08/29/2017  0838   ALT 12 09/11/2018 0804   ALT 16 08/29/2017 0838   BILITOT 0.5 09/11/2018 0804   BILITOT 0.33 08/29/2017 0838       RADIOGRAPHIC STUDIES: I have personally reviewed the radiological images as listed and agreed with the findings in the report. Dg Chest Port 1 View  Result Date: 08/29/2018 CLINICAL DATA:  Postop right Port-A-Cath placement. EXAM: PORTABLE CHEST 1 VIEW COMPARISON:  Chest x-ray dated 03/18/2017 FINDINGS: Power port has been inserted. The tip is in the superior vena cava at the level of the azygos vein. No pneumothorax. Mild cardiomegaly. Pulmonary vascularity is normal. No infiltrates or effusions. No acute bone abnormality. IMPRESSION: Port-A-Cath in good position. No pneumothorax. Electronically Signed   By: Lorriane Shire M.D.   On: 08/29/2018 10:59   Dg Fluoro Guide Cv Line-no Report  Result Date: 08/29/2018 Fluoroscopy was utilized by the requesting physician.  No radiographic interpretation.    All questions were answered. The patient knows to call the clinic with any problems, questions or concerns. No barriers to learning was detected.  I spent 15 minutes counseling the patient face to face. The total time spent in the appointment was 20 minutes and more than 50% was on counseling and review of test results  [name], MD 09/20/2018 7:59 AM

## 2018-09-20 NOTE — Assessment & Plan Note (Signed)
Patient has chronic fatigue and generalized weakness and she perceived that more so after recent chemotherapy I recommend graduated exercise as tolerated If she tolerate for cycle of chemotherapy well, I will refer her to physical therapy for rehab

## 2018-09-21 ENCOUNTER — Telehealth: Payer: Self-pay

## 2018-09-21 NOTE — Telephone Encounter (Signed)
To prevent infection and gout during Rx

## 2018-09-21 NOTE — Telephone Encounter (Signed)
Called and given below message. She verbalized understanding. 

## 2018-09-21 NOTE — Telephone Encounter (Signed)
She called and left a message on 12/4. She received the Rx's Acyclovir and Allopurinol today in the mail. Why does she have to take them and when does she start?

## 2018-09-25 ENCOUNTER — Ambulatory Visit: Payer: Medicare HMO | Admitting: Hematology and Oncology

## 2018-09-25 ENCOUNTER — Other Ambulatory Visit: Payer: Medicare HMO

## 2018-10-06 ENCOUNTER — Other Ambulatory Visit: Payer: Medicare HMO

## 2018-10-06 ENCOUNTER — Inpatient Hospital Stay (HOSPITAL_BASED_OUTPATIENT_CLINIC_OR_DEPARTMENT_OTHER): Payer: No Typology Code available for payment source | Admitting: Hematology and Oncology

## 2018-10-06 ENCOUNTER — Inpatient Hospital Stay: Payer: No Typology Code available for payment source

## 2018-10-06 ENCOUNTER — Encounter: Payer: Self-pay | Admitting: Hematology and Oncology

## 2018-10-06 ENCOUNTER — Ambulatory Visit: Payer: Medicare HMO | Admitting: Hematology and Oncology

## 2018-10-06 DIAGNOSIS — R531 Weakness: Secondary | ICD-10-CM

## 2018-10-06 DIAGNOSIS — I129 Hypertensive chronic kidney disease with stage 1 through stage 4 chronic kidney disease, or unspecified chronic kidney disease: Secondary | ICD-10-CM

## 2018-10-06 DIAGNOSIS — R5383 Other fatigue: Secondary | ICD-10-CM

## 2018-10-06 DIAGNOSIS — Z79899 Other long term (current) drug therapy: Secondary | ICD-10-CM

## 2018-10-06 DIAGNOSIS — R42 Dizziness and giddiness: Secondary | ICD-10-CM

## 2018-10-06 DIAGNOSIS — N183 Chronic kidney disease, stage 3 unspecified: Secondary | ICD-10-CM

## 2018-10-06 DIAGNOSIS — I482 Chronic atrial fibrillation, unspecified: Secondary | ICD-10-CM

## 2018-10-06 DIAGNOSIS — C8238 Follicular lymphoma grade IIIa, lymph nodes of multiple sites: Secondary | ICD-10-CM

## 2018-10-06 DIAGNOSIS — K210 Gastro-esophageal reflux disease with esophagitis: Secondary | ICD-10-CM

## 2018-10-06 DIAGNOSIS — Z5112 Encounter for antineoplastic immunotherapy: Secondary | ICD-10-CM | POA: Diagnosis not present

## 2018-10-06 DIAGNOSIS — Z7901 Long term (current) use of anticoagulants: Secondary | ICD-10-CM

## 2018-10-06 DIAGNOSIS — I959 Hypotension, unspecified: Secondary | ICD-10-CM

## 2018-10-06 LAB — CBC WITH DIFFERENTIAL (CANCER CENTER ONLY)
Abs Immature Granulocytes: 0.01 10*3/uL (ref 0.00–0.07)
Basophils Absolute: 0 10*3/uL (ref 0.0–0.1)
Basophils Relative: 1 %
Eosinophils Absolute: 0.3 10*3/uL (ref 0.0–0.5)
Eosinophils Relative: 6 %
HCT: 40.1 % (ref 36.0–46.0)
Hemoglobin: 12.8 g/dL (ref 12.0–15.0)
Immature Granulocytes: 0 %
Lymphocytes Relative: 8 %
Lymphs Abs: 0.4 10*3/uL — ABNORMAL LOW (ref 0.7–4.0)
MCH: 30 pg (ref 26.0–34.0)
MCHC: 31.9 g/dL (ref 30.0–36.0)
MCV: 93.9 fL (ref 80.0–100.0)
Monocytes Absolute: 0.7 10*3/uL (ref 0.1–1.0)
Monocytes Relative: 13 %
Neutro Abs: 3.7 10*3/uL (ref 1.7–7.7)
Neutrophils Relative %: 72 %
Platelet Count: 167 10*3/uL (ref 150–400)
RBC: 4.27 MIL/uL (ref 3.87–5.11)
RDW: 13.4 % (ref 11.5–15.5)
WBC Count: 5.1 10*3/uL (ref 4.0–10.5)
nRBC: 0 % (ref 0.0–0.2)

## 2018-10-06 LAB — CMP (CANCER CENTER ONLY)
ALT: 15 U/L (ref 0–44)
AST: 20 U/L (ref 15–41)
Albumin: 3.8 g/dL (ref 3.5–5.0)
Alkaline Phosphatase: 90 U/L (ref 38–126)
Anion gap: 9 (ref 5–15)
BUN: 26 mg/dL — ABNORMAL HIGH (ref 8–23)
CO2: 24 mmol/L (ref 22–32)
Calcium: 9.1 mg/dL (ref 8.9–10.3)
Chloride: 108 mmol/L (ref 98–111)
Creatinine: 1.07 mg/dL — ABNORMAL HIGH (ref 0.44–1.00)
GFR, Est AFR Am: 59 mL/min — ABNORMAL LOW (ref >60)
GFR, Estimated: 51 mL/min — ABNORMAL LOW (ref >60)
Glucose, Bld: 108 mg/dL — ABNORMAL HIGH (ref 70–99)
Potassium: 4.1 mmol/L (ref 3.5–5.1)
Sodium: 141 mmol/L (ref 135–145)
Total Bilirubin: 0.6 mg/dL (ref 0.3–1.2)
Total Protein: 6.3 g/dL — ABNORMAL LOW (ref 6.5–8.1)

## 2018-10-06 MED ORDER — SODIUM CHLORIDE 0.9% FLUSH
10.0000 mL | INTRAVENOUS | Status: DC | PRN
  Administered 2018-10-06: 10 mL
  Filled 2018-10-06: qty 10

## 2018-10-06 NOTE — Assessment & Plan Note (Signed)
Her blood count is satisfactory and she feels well She has recovered from all side effects of treatment The subcutaneous nodule over her left scapula is no longer palpable I recommend proceed with chemotherapy, minimum 3 cycles before repeat imaging study and she agreed with the plan of care

## 2018-10-06 NOTE — Assessment & Plan Note (Signed)
She has chronic kidney disease stage III.  This is likely related to cardiac issues Serum creatinine is stable. Continue medical management 

## 2018-10-06 NOTE — Patient Instructions (Signed)

## 2018-10-06 NOTE — Assessment & Plan Note (Signed)
The patient is symptomatic with dizziness recently Her blood pressure is low I recommend close blood pressure monitoring at home She has no clinical signs of congestive heart failure on exam

## 2018-10-06 NOTE — Progress Notes (Signed)
[place] OFFICE PROGRESS NOTE  Patient Care Team: Reubin Milan, MD as PCP - General (Internal Medicine) Carola Frost, RN as Registered Nurse (Medical Oncology) [name], MD as Attending Physician (Internal Medicine)  ASSESSMENT & PLAN:  Grade 3a follicular lymphoma of lymph nodes of multiple regions [facility]) Her blood count is satisfactory and she feels well She has recovered from all side effects of treatment The subcutaneous nodule over her left scapula is no longer palpable I recommend proceed with chemotherapy, minimum 3 cycles before repeat imaging study and she agreed with the plan of care  Chronic kidney disease, stage III (moderate) She has chronic kidney disease stage III.  This is likely related to cardiac issues Serum creatinine is stable. Continue medical management  Chronic atrial fibrillation (HCC) The patient is symptomatic with dizziness recently Her blood pressure is low I recommend close blood pressure monitoring at home She has no clinical signs of congestive heart failure on exam   No orders of the defined types were placed in this encounter.   INTERVAL HISTORY: Please see below for problem oriented charting. She returns for further follow-up She feels well No recent infection, fever or chills No nausea or constipation She has occasional dizziness No new lymphadenopathy  SUMMARY OF ONCOLOGIC HISTORY:   Grade 3a follicular lymphoma of lymph nodes of multiple regions ([place])   01/21/2015 Surgery    She underwent excisional lymph node biopsy that came back follicular lymphoma grade 3    01/21/2015 Pathology Results    Accession: [id number] biopsy confirmed follicular lymphoma    9/76/7341 Imaging    Echocardiogram showed ejection fraction of 55-60%    02/11/2015 Imaging     PET CT scan show possible splenic involvement and diffuse lymphadenopathy throughout    02/11/2015 Bone Marrow Biopsy     bone marrow biopsy was  performed and is involved by lymphoma with translocation of igH/BCL2    02/13/2015 Procedure    She had port placement.    02/17/2015 - 06/02/2015 Chemotherapy    She received R-CHOP chemo x 6    02/17/2015 Adverse Reaction    She had mild infusion reaction with cycle 1 of treatment.    04/18/2015 Imaging    PET CT scan showed near complete response to treatment.    04/22/2015 Adverse Reaction    Vincristine dose was reduced by 50% due to neuropathy from cycle 4 onwards    06/25/2015 - 07/06/2015 Hospital Admission    She was hospitalized for recent sepsis/bacteremia and a fib with RVR    07/11/2015 Imaging    repeat PEt scan showed complete response to Rx    07/14/2015 - 06/16/2017 Chemotherapy    She received maintenance Rituximab every 60 days    12/15/2015 Imaging    PET CT showed no evidence of cancer recurrence    06/15/2016 PET scan    No evidence of active lymphoma on skullbase to thigh FDG PET scan. Small LEFT periaortic retroperitoneal lymph nodes without significant metabolic activity ( Deauville 1). No change from prior    03/24/2017 Imaging    1. Borderline enlarged abdominal retroperitoneal lymph nodes, stable. No new adenopathy in the chest, abdomen or pelvis. 2. Aortic atherosclerosis (ICD10-170.0). 3. Enlarged pulmonary arteries, indicative of pulmonary arterial hypertension.    09/14/2017 PET scan    Stable exam. No evidence of active lymphoma or other acute findings. (Deauville score 1)    08/08/2018 PET scan    1. Evidence of progressive/recurrent disease, as  evidenced by new hypermetabolic nodes and subcutaneous nodule/nodes throughout left chest and abdomen. (Deauville 5). 2. Likely physiologic right nasopharyngeal hypermetabolism. Recommend attention on follow-up. 3. Incidental findings, including pulmonary artery enlargement, suggesting pulmonary arterial hypertension, stable left lower lobe pulmonary nodule, and aortic Atherosclerosis (ICD10-I70.0).    08/29/2018  Initial Biopsy    Soft tissue simple excision left back: Follicular lymphoma grade 1-2 positive for CD20, PAX5, bcl-6, and bcl-2.  Ki-67 30%    09/04/2018 -  Chemotherapy    The patient had Bendamustine and Rituximab     REVIEW OF SYSTEMS:   Constitutional: Denies fevers, chills or abnormal weight loss Eyes: Denies blurriness of vision Ears, nose, mouth, throat, and face: Denies mucositis or sore throat Respiratory: Denies cough, dyspnea or wheezes Cardiovascular: Denies palpitation, chest discomfort or lower extremity swelling Gastrointestinal:  Denies nausea, heartburn or change in bowel habits Skin: Denies abnormal skin rashes Lymphatics: Denies new lymphadenopathy or easy bruising Neurological:Denies numbness, tingling or new weaknesses Behavioral/Psych: Mood is stable, no new changes  All other systems were reviewed with the patient and are negative.  I have reviewed the past medical history, past surgical history, social history and family history with the patient and they are unchanged from previous note.  ALLERGIES:  has No Known Allergies.  MEDICATIONS:  Current Outpatient Medications  Medication Sig Dispense Refill  . acetaminophen (TYLENOL) 500 MG tablet Take 500 mg by mouth every 6 (six) hours as needed for moderate pain or headache.    Marland Kitchen acyclovir (ZOVIRAX) 400 MG tablet Take 1 tablet (400 mg total) by mouth daily. 30 tablet 3  . allopurinol (ZYLOPRIM) 300 MG tablet Take 1 tablet (300 mg total) by mouth daily. 30 tablet 3  . amoxicillin (AMOXIL) 500 MG capsule Take 2,000 mg by mouth See admin instructions. Take 2000 mg by mouth 1 hour prior to dental appointment    . apixaban (ELIQUIS) 5 MG TABS tablet Take 5 mg by mouth 2 (two) times daily.     . bisacodyl (DULCOLAX) 5 MG EC tablet Take 5 mg by mouth daily as needed for moderate constipation.    . diclofenac sodium (VOLTAREN) 1 % GEL Apply 2 g topically 3 (three) times daily as needed (knee pain). (Patient taking  differently: Apply 2 g topically 3 (three) times daily as needed (for back or knee pain). ) 100 g 0  . levothyroxine (SYNTHROID, LEVOTHROID) 112 MCG tablet Take 112 mcg by mouth daily.   9  . lidocaine-prilocaine (EMLA) cream Apply to affected area once 30 g 3  . LORazepam (ATIVAN) 0.5 MG tablet Take 1 tablet (0.5 mg total) by mouth every 6 (six) hours as needed (Nausea or vomiting). 30 tablet 0  . omeprazole (PRILOSEC) 40 MG capsule Take 1 capsule (40 mg total) by mouth daily. 90 capsule 1  . ondansetron (ZOFRAN) 8 MG tablet Take 1 tablet (8 mg total) by mouth 2 (two) times daily as needed for refractory nausea / vomiting. Start on day 2 after bendamustine chemotherapy. 30 tablet 1  . oxyCODONE (OXY IR/ROXICODONE) 5 MG immediate release tablet Take 1 tablet (5 mg total) by mouth every 6 (six) hours as needed for severe pain. 10 tablet 0  . prochlorperazine (COMPAZINE) 10 MG tablet Take 1 tablet (10 mg total) by mouth every 6 (six) hours as needed (Nausea or vomiting). 30 tablet 1   No current facility-administered medications for this visit.    Facility-Administered Medications Ordered in Other Visits  Medication Dose Route Frequency Provider Last  Rate Last Dose  . ondansetron (ZOFRAN) 8 mg in sodium chloride 0.9 % 50 mL IVPB   Intravenous Once Hesston Hitchens, MD      . sodium chloride 0.9 % injection 10 mL  10 mL Intravenous PRN Alvy Bimler, Khylie Larmore, MD   10 mL at 04/05/17 1315    PHYSICAL EXAMINATION: ECOG PERFORMANCE STATUS: 1 - Symptomatic but completely ambulatory  Vitals:   10/06/18 1103  BP: 98/67  Pulse: 67  Resp: 17  Temp: 98.4 F (36.9 C)  SpO2: 100%   Filed Weights   10/06/18 1103  Weight: 142 lb 9.6 oz (64.7 kg)    GENERAL:alert, no distress and comfortable SKIN: skin color, texture, turgor are normal, no rashes or significant lesions EYES: normal, Conjunctiva are pink and non-injected, sclera clear OROPHARYNX:no exudate, no erythema and lips, buccal mucosa, and tongue normal   NECK: supple, thyroid normal size, non-tender, without nodularity LYMPH:  no palpable lymphadenopathy in the cervical, axillary or inguinal LUNGS: clear to auscultation and percussion with normal breathing effort HEART: regular rate & rhythm and no murmurs and no lower extremity edema ABDOMEN:abdomen soft, non-tender and normal bowel sounds Musculoskeletal:no cyanosis of digits and no clubbing  NEURO: alert & oriented x 3 with fluent speech, no focal motor/sensory deficits  LABORATORY DATA:  I have reviewed the data as listed    Component Value Date/Time   NA 141 10/06/2018 0942   NA 142 08/29/2017 0838   K 4.1 10/06/2018 0942   K 4.2 08/29/2017 0838   CL 108 10/06/2018 0942   CO2 24 10/06/2018 0942   CO2 23 08/29/2017 0838   GLUCOSE 108 (H) 10/06/2018 0942   GLUCOSE 97 08/29/2017 0838   BUN 26 (H) 10/06/2018 0942   BUN 24.0 08/29/2017 0838   CREATININE 1.07 (H) 10/06/2018 0942   CREATININE 1.0 08/29/2017 0838   CALCIUM 9.1 10/06/2018 0942   CALCIUM 8.9 08/29/2017 0838   PROT 6.3 (L) 10/06/2018 0942   PROT 6.4 08/29/2017 0838   ALBUMIN 3.8 10/06/2018 0942   ALBUMIN 3.8 08/29/2017 0838   AST 20 10/06/2018 0942   AST 18 08/29/2017 0838   ALT 15 10/06/2018 0942   ALT 16 08/29/2017 0838   ALKPHOS 90 10/06/2018 0942   ALKPHOS 93 08/29/2017 0838   BILITOT 0.6 10/06/2018 0942   BILITOT 0.33 08/29/2017 0838   GFRNONAA 51 (L) 10/06/2018 0942   GFRAA 59 (L) 10/06/2018 0942    No results found for: SPEP, UPEP  Lab Results  Component Value Date   WBC 5.1 10/06/2018   NEUTROABS 3.7 10/06/2018   HGB 12.8 10/06/2018   HCT 40.1 10/06/2018   MCV 93.9 10/06/2018   PLT 167 10/06/2018      Chemistry      Component Value Date/Time   NA 141 10/06/2018 0942   NA 142 08/29/2017 0838   K 4.1 10/06/2018 0942   K 4.2 08/29/2017 0838   CL 108 10/06/2018 0942   CO2 24 10/06/2018 0942   CO2 23 08/29/2017 0838   BUN 26 (H) 10/06/2018 0942   BUN 24.0 08/29/2017 0838   CREATININE  1.07 (H) 10/06/2018 0942   CREATININE 1.0 08/29/2017 0838      Component Value Date/Time   CALCIUM 9.1 10/06/2018 0942   CALCIUM 8.9 08/29/2017 0838   ALKPHOS 90 10/06/2018 0942   ALKPHOS 93 08/29/2017 0838   AST 20 10/06/2018 0942   AST 18 08/29/2017 0838   ALT 15 10/06/2018 0942   ALT 16 08/29/2017 6979  BILITOT 0.6 10/06/2018 0942   BILITOT 0.33 08/29/2017 5670      All questions were answered. The patient knows to call the clinic with any problems, questions or concerns. No barriers to learning was detected.  I spent 15 minutes counseling the patient face to face. The total time spent in the appointment was 20 minutes and more than 50% was on counseling and review of test results  [name], MD 10/06/2018 1:29 PM

## 2018-10-09 ENCOUNTER — Other Ambulatory Visit: Payer: Medicare HMO

## 2018-10-09 ENCOUNTER — Inpatient Hospital Stay: Payer: No Typology Code available for payment source

## 2018-10-09 VITALS — BP 146/94 | HR 89 | Temp 98.7°F | Resp 17

## 2018-10-09 DIAGNOSIS — C8238 Follicular lymphoma grade IIIa, lymph nodes of multiple sites: Secondary | ICD-10-CM

## 2018-10-09 DIAGNOSIS — Z5112 Encounter for antineoplastic immunotherapy: Secondary | ICD-10-CM | POA: Diagnosis not present

## 2018-10-09 MED ORDER — PALONOSETRON HCL INJECTION 0.25 MG/5ML
INTRAVENOUS | Status: AC
  Filled 2018-10-09: qty 5

## 2018-10-09 MED ORDER — ACETAMINOPHEN 325 MG PO TABS
650.0000 mg | ORAL_TABLET | Freq: Once | ORAL | Status: AC
  Administered 2018-10-09: 650 mg via ORAL

## 2018-10-09 MED ORDER — DIPHENHYDRAMINE HCL 25 MG PO CAPS
ORAL_CAPSULE | ORAL | Status: AC
  Filled 2018-10-09: qty 2

## 2018-10-09 MED ORDER — SODIUM CHLORIDE 0.9 % IV SOLN
Freq: Once | INTRAVENOUS | Status: AC
  Administered 2018-10-09: 10:00:00 via INTRAVENOUS
  Filled 2018-10-09: qty 250

## 2018-10-09 MED ORDER — SODIUM CHLORIDE 0.9% FLUSH
10.0000 mL | INTRAVENOUS | Status: DC | PRN
  Administered 2018-10-09: 10 mL
  Filled 2018-10-09: qty 10

## 2018-10-09 MED ORDER — HEPARIN SOD (PORK) LOCK FLUSH 100 UNIT/ML IV SOLN
500.0000 [IU] | Freq: Once | INTRAVENOUS | Status: AC | PRN
  Administered 2018-10-09: 500 [IU]
  Filled 2018-10-09: qty 5

## 2018-10-09 MED ORDER — ACETAMINOPHEN 325 MG PO TABS
ORAL_TABLET | ORAL | Status: AC
  Filled 2018-10-09: qty 2

## 2018-10-09 MED ORDER — DEXAMETHASONE SODIUM PHOSPHATE 10 MG/ML IJ SOLN
INTRAMUSCULAR | Status: AC
  Filled 2018-10-09: qty 1

## 2018-10-09 MED ORDER — SODIUM CHLORIDE 0.9 % IV SOLN
90.0000 mg/m2 | Freq: Once | INTRAVENOUS | Status: AC
  Administered 2018-10-09: 150 mg via INTRAVENOUS
  Filled 2018-10-09: qty 6

## 2018-10-09 MED ORDER — PALONOSETRON HCL INJECTION 0.25 MG/5ML
0.2500 mg | Freq: Once | INTRAVENOUS | Status: AC
  Administered 2018-10-09: 0.25 mg via INTRAVENOUS

## 2018-10-09 MED ORDER — DEXAMETHASONE SODIUM PHOSPHATE 10 MG/ML IJ SOLN
10.0000 mg | Freq: Once | INTRAMUSCULAR | Status: AC
  Administered 2018-10-09: 10 mg via INTRAVENOUS

## 2018-10-09 MED ORDER — DIPHENHYDRAMINE HCL 25 MG PO CAPS
50.0000 mg | ORAL_CAPSULE | Freq: Once | ORAL | Status: AC
  Administered 2018-10-09: 50 mg via ORAL

## 2018-10-09 MED ORDER — SODIUM CHLORIDE 0.9 % IV SOLN
375.0000 mg/m2 | Freq: Once | INTRAVENOUS | Status: AC
  Administered 2018-10-09: 600 mg via INTRAVENOUS
  Filled 2018-10-09: qty 50

## 2018-10-09 NOTE — Patient Instructions (Signed)
[facility] Discharge Instructions for Patients Receiving Chemotherapy  Today you received the following chemotherapy agents Rituxan and Bendamustine  To help prevent nausea and vomiting after your treatment, we encourage you to take your nausea medication as directed   If you develop nausea and vomiting that is not controlled by your nausea medication, call the clinic.   BELOW ARE SYMPTOMS THAT SHOULD BE REPORTED IMMEDIATELY:  *FEVER GREATER THAN 100.5 F  *CHILLS WITH OR WITHOUT FEVER  NAUSEA AND VOMITING THAT IS NOT CONTROLLED WITH YOUR NAUSEA MEDICATION  *UNUSUAL SHORTNESS OF BREATH  *UNUSUAL BRUISING OR BLEEDING  TENDERNESS IN MOUTH AND THROAT WITH OR WITHOUT PRESENCE OF ULCERS  *URINARY PROBLEMS  *BOWEL PROBLEMS  UNUSUAL RASH Items with * indicate a potential emergency and should be followed up as soon as possible.  Feel free to call the clinic you have any questions or concerns. The clinic phone number is [phone].  Please show the CHEMO ALERT CARD at check-in to the Emergency Department and triage nurse.   

## 2018-10-10 ENCOUNTER — Inpatient Hospital Stay (HOSPITAL_BASED_OUTPATIENT_CLINIC_OR_DEPARTMENT_OTHER): Payer: No Typology Code available for payment source | Admitting: Medical

## 2018-10-10 ENCOUNTER — Inpatient Hospital Stay: Payer: No Typology Code available for payment source

## 2018-10-10 ENCOUNTER — Other Ambulatory Visit: Payer: Self-pay | Admitting: Medical

## 2018-10-10 ENCOUNTER — Ambulatory Visit (HOSPITAL_COMMUNITY)
Admission: RE | Admit: 2018-10-10 | Discharge: 2018-10-10 | Disposition: A | Discharge status: Home / Self Care | Payer: Medicare HMO | Source: Ambulatory Visit | Attending: Medical | Admitting: Medical

## 2018-10-10 VITALS — BP 134/62 | HR 88 | Temp 99.1°F | Resp 18

## 2018-10-10 DIAGNOSIS — R509 Fever, unspecified: Secondary | ICD-10-CM

## 2018-10-10 DIAGNOSIS — N183 Chronic kidney disease, stage 3 (moderate): Secondary | ICD-10-CM | POA: Diagnosis not present

## 2018-10-10 DIAGNOSIS — R0902 Hypoxemia: Secondary | ICD-10-CM | POA: Insufficient documentation

## 2018-10-10 DIAGNOSIS — C8238 Follicular lymphoma grade IIIa, lymph nodes of multiple sites: Secondary | ICD-10-CM

## 2018-10-10 DIAGNOSIS — E039 Hypothyroidism, unspecified: Secondary | ICD-10-CM | POA: Diagnosis not present

## 2018-10-10 DIAGNOSIS — R232 Flushing: Secondary | ICD-10-CM | POA: Diagnosis not present

## 2018-10-10 DIAGNOSIS — R5383 Other fatigue: Secondary | ICD-10-CM | POA: Diagnosis not present

## 2018-10-10 DIAGNOSIS — D72823 Leukemoid reaction: Secondary | ICD-10-CM | POA: Diagnosis not present

## 2018-10-10 DIAGNOSIS — I129 Hypertensive chronic kidney disease with stage 1 through stage 4 chronic kidney disease, or unspecified chronic kidney disease: Secondary | ICD-10-CM

## 2018-10-10 DIAGNOSIS — I482 Chronic atrial fibrillation, unspecified: Secondary | ICD-10-CM

## 2018-10-10 DIAGNOSIS — Z5112 Encounter for antineoplastic immunotherapy: Secondary | ICD-10-CM | POA: Diagnosis not present

## 2018-10-10 DIAGNOSIS — Z7901 Long term (current) use of anticoagulants: Secondary | ICD-10-CM

## 2018-10-10 DIAGNOSIS — Z79899 Other long term (current) drug therapy: Secondary | ICD-10-CM

## 2018-10-10 LAB — CBC WITH DIFFERENTIAL (CANCER CENTER ONLY)
Abs Immature Granulocytes: 0.04 10*3/uL (ref 0.00–0.07)
Basophils Absolute: 0 10*3/uL (ref 0.0–0.1)
Basophils Relative: 0 %
Eosinophils Absolute: 0.4 10*3/uL (ref 0.0–0.5)
Eosinophils Relative: 3 %
HCT: 36 % (ref 36.0–46.0)
Hemoglobin: 11.5 g/dL — ABNORMAL LOW (ref 12.0–15.0)
Immature Granulocytes: 0 %
Lymphocytes Relative: 1 %
Lymphs Abs: 0.1 10*3/uL — ABNORMAL LOW (ref 0.7–4.0)
MCH: 30.2 pg (ref 26.0–34.0)
MCHC: 31.9 g/dL (ref 30.0–36.0)
MCV: 94.5 fL (ref 80.0–100.0)
Monocytes Absolute: 0.5 10*3/uL (ref 0.1–1.0)
Monocytes Relative: 4 %
Neutro Abs: 11.6 10*3/uL — ABNORMAL HIGH (ref 1.7–7.7)
Neutrophils Relative %: 92 %
Platelet Count: 121 10*3/uL — ABNORMAL LOW (ref 150–400)
RBC: 3.81 MIL/uL — ABNORMAL LOW (ref 3.87–5.11)
RDW: 13.9 % (ref 11.5–15.5)
WBC Count: 12.7 10*3/uL — ABNORMAL HIGH (ref 4.0–10.5)
nRBC: 0 % (ref 0.0–0.2)

## 2018-10-10 MED ORDER — DEXAMETHASONE SODIUM PHOSPHATE 10 MG/ML IJ SOLN
INTRAMUSCULAR | Status: AC
  Filled 2018-10-10: qty 1

## 2018-10-10 MED ORDER — PEGFILGRASTIM 6 MG/0.6ML ~~LOC~~ PSKT
6.0000 mg | PREFILLED_SYRINGE | Freq: Once | SUBCUTANEOUS | Status: AC
  Administered 2018-10-10: 6 mg via SUBCUTANEOUS

## 2018-10-10 MED ORDER — HEPARIN SOD (PORK) LOCK FLUSH 100 UNIT/ML IV SOLN
500.0000 [IU] | Freq: Once | INTRAVENOUS | Status: AC | PRN
  Administered 2018-10-10: 500 [IU]
  Filled 2018-10-10: qty 5

## 2018-10-10 MED ORDER — LEVOFLOXACIN 500 MG PO TABS: 500.0000 mg | ORAL_TABLET | Freq: Every day | ORAL | 0 refills | Status: DC

## 2018-10-10 MED ORDER — DEXAMETHASONE SODIUM PHOSPHATE 10 MG/ML IJ SOLN
10.0000 mg | Freq: Once | INTRAMUSCULAR | Status: AC
  Administered 2018-10-10: 10 mg via INTRAVENOUS

## 2018-10-10 MED ORDER — SODIUM CHLORIDE 0.9 % IV SOLN
90.0000 mg/m2 | Freq: Once | INTRAVENOUS | Status: AC
  Administered 2018-10-10: 150 mg via INTRAVENOUS
  Filled 2018-10-10: qty 6

## 2018-10-10 MED ORDER — SODIUM CHLORIDE 0.9% FLUSH
10.0000 mL | INTRAVENOUS | Status: DC | PRN
  Administered 2018-10-10: 10 mL
  Filled 2018-10-10: qty 10

## 2018-10-10 MED ORDER — PEGFILGRASTIM 6 MG/0.6ML ~~LOC~~ PSKT
PREFILLED_SYRINGE | SUBCUTANEOUS | Status: AC
  Filled 2018-10-10: qty 0.6

## 2018-10-10 MED ORDER — LEVOFLOXACIN IN D5W 500 MG/100ML IV SOLN
500.0000 mg | Freq: Once | INTRAVENOUS | Status: AC
  Administered 2018-10-10: 500 mg via INTRAVENOUS
  Filled 2018-10-10: qty 100

## 2018-10-10 MED ORDER — SODIUM CHLORIDE 0.9 % IV SOLN
Freq: Once | INTRAVENOUS | Status: AC
  Administered 2018-10-10: 12:00:00 via INTRAVENOUS
  Filled 2018-10-10: qty 250

## 2018-10-10 NOTE — Progress Notes (Signed)
Pt to infusion area this morning with elevated temp, increased respirations and low O2 sats, flushing of the face, neck, and chest, and seemingly anxious. Pt reported feeling fatigued, but that her SOB was "normal for her" upon exertion. After resting, her O2 sats did rise and respirations slowed (see vital flowsheet). Sandi Mealy, PA to see pt in tx area per Dr. Calton Dach request.   Per Lucianne Lei; send pt for chest xray, then come back, draw CBC, and go ahead with bendeka treatment today. Will continue to monitor.

## 2018-10-10 NOTE — Patient Instructions (Signed)
[facility] Discharge Instructions for Patients Receiving Chemotherapy  Today you received the following chemotherapy agents Bendamustine  To help prevent nausea and vomiting after your treatment, we encourage you to take your nausea medication as directed   If you develop nausea and vomiting that is not controlled by your nausea medication, call the clinic.   BELOW ARE SYMPTOMS THAT SHOULD BE REPORTED IMMEDIATELY:  *FEVER GREATER THAN 100.5 F  *CHILLS WITH OR WITHOUT FEVER  NAUSEA AND VOMITING THAT IS NOT CONTROLLED WITH YOUR NAUSEA MEDICATION  *UNUSUAL SHORTNESS OF BREATH  *UNUSUAL BRUISING OR BLEEDING  TENDERNESS IN MOUTH AND THROAT WITH OR WITHOUT PRESENCE OF ULCERS  *URINARY PROBLEMS  *BOWEL PROBLEMS  UNUSUAL RASH Items with * indicate a potential emergency and should be followed up as soon as possible.  Feel free to call the clinic you have any questions or concerns. The clinic phone number is [phone].  Please show the CHEMO ALERT CARD at check-in to the Emergency Department and triage nurse.   

## 2018-10-12 ENCOUNTER — Telehealth: Payer: Self-pay | Admitting: Medical

## 2018-10-12 NOTE — Telephone Encounter (Signed)
No los per 12/24. °

## 2018-10-12 NOTE — Progress Notes (Signed)
[facility] Progress Note   TAGEN BRETHAUER [id number] January 30, 1941 77 y.o.  Carrie [name] is managed by Dr. [name]  Actively treated with chemotherapy/immunotherapy/hormonal therapy: yes  Current Therapy: Rituxan and Bendeka  Last Treated:   10/09/2018 (cycle 3, day 1)  Assessment: Plan:    Fever, unspecified fever cause - Plan: levofloxacin (LEVAQUIN) IVPB 500 mg, levofloxacin (LEVAQUIN) 500 MG tablet, DISCONTINUED: levofloxacin (LEVAQUIN) 500 MG tablet  Leukemoid reaction - Plan: levofloxacin (LEVAQUIN) IVPB 500 mg, levofloxacin (LEVAQUIN) 500 MG tablet, DISCONTINUED: levofloxacin (LEVAQUIN) 500 MG tablet  Grade 3a follicular lymphoma of lymph nodes of multiple regions (HCC)  Hypoxia   Fever, leukemoid reaction, and hypoxia: The patient was noted to have a temperature of 100.5 with her oxygen saturations between 80 and 90%.  A CBC returned showing a WBC of 12.7 and an ANC of 11.6.  The patient was referred for a chest x-ray that showed:  Enlargement of cardiac silhouette. Enlargement of RIGHT hilum increased since 08/29/2018 suspicious for adenopathy. Probable mild atelectasis at RIGHT base.  She was given Levaquin 500 mg IV x1.  Initially a prescription for Levaquin 500 mg once daily for 7 days was sent to the [facility].  It was later changed to 250 mg p.o. once daily for 7 days per the recommendations of the [facility].  Grade 3a follicular lymphoma: The patient continues to be managed by Dr. [name].  She will receive cycle 3, day 2 of Rituxan and Bendeka with the patient to receive Bendeka only today.  She will return as scheduled on 11/06/2018.  Please see After Visit Summary for patient specific instructions.  Future Appointments  Date Time Provider [place]  11/06/2018  9:15 AM [facility] LAB 4 [facility] None  11/06/2018  9:30 AM [facility] [place] FLUSH [facility] None  11/06/2018 10:00 AM Gorsuch, Ni, MD [facility] None  11/06/2018  11:00 AM [facility] INFUSION [facility] None  11/07/2018  1:15 PM [facility] INFUSION [facility] None  12/01/2018 10:00 AM [facility] LAB 4 [facility] None  12/01/2018 10:15 AM [facility] [place] FLUSH [facility] None  12/04/2018  9:00 AM Gorsuch, Ni, MD [facility] None  12/04/2018 10:15 AM [facility] INFUSION [facility] None  12/05/2018  1:00 PM [facility] INFUSION [facility] None    No orders of the defined types were placed in this encounter.      Subjective:   Patient ID:  Carrie [name] is a 77 y.o. (DOB 1941/05/29) female.  Chief Complaint: No chief complaint on file.   HPI [name] is a 77 year old female with a history of a stage IIIa follicular lymphoma who is managed by Dr. [name].  She presents to the clinic today for cycle 3, day 2 of Rituxan and Bendeka with the patient to receive Bendeka only.  She initially presented with a temperature of 100.5, respirations of 24, and an oxygen saturation of 80 to 90% on room air.  She was also noted to have flushing of the face, neck, and chest.  It was initially reported that the patientappeared slightly anxious however she did not appear anxious when she was seen by this provider..  She reported that she was feeling fatigue and that her shortness of breath on exertion was "normal for her."  Upon rest her oxygen saturation level returned to the low 90s with normalization of her respiration rate.  She denied a productive cough.  She was referred for a chest x-ray which returned showing enlargement of the cardiac silhouette, enlargement of the  right hilum which increased since 08/29/2018.  This was suspicious for adenopathy.  Also noted was probable mild atelectasis at the right lung base.  The patient's labs were reviewed from her prior treatments.  She had not shown evidence of neutropenia.  Given her leukocytosis of 12.7 today it was decided that the patient could proceed with her chemotherapy today.  Medications: I have reviewed  the patient's current medications.  Allergies: No Known Allergies  Past Medical History:  Diagnosis Date  . A-fib ([place])   . Anemia    "years ago"  . Atrial fibrillation with rapid ventricular response ([place]) 06/25/2015  . Carpal tunnel syndrome, bilateral   . Cervical spondylosis without myelopathy 10/25/2013  . Dental bridge present   . DJD (degenerative joint disease)   . Dysrhythmia    WENT INTO A FIB IN 2017   . Elevated troponin 02/18/2015  . Follicular lymphoma grade 3a ([place]) 02/03/2015  . History of echocardiogram    Echo 12/16: EF 60-65%, no RWMA, severe LAE  . History of nuclear stress test    Myoview 1/17: EF 55%, Normal study. No ischemia or scar.  . Hypothyroid   . Nausea without vomiting 06/20/2015  . Pneumonia   . Stage III chronic kidney disease ([place]) 07/01/2015  . Thrush of mouth and esophagus ([place]) 06/25/2015    Past Surgical History:  Procedure Laterality Date  . ABDOMINAL HYSTERECTOMY    . APPENDECTOMY    . BACK SURGERY     X5-lumbar-fusion  . COLONOSCOPY    . DILATION AND CURETTAGE OF UTERUS    . LYMPH NODE BIOPSY Right 01/21/2015   Procedure: RIGHT GROIN LYMPH NODE BIOPSY;  Surgeon: Erroll Luna, MD;  Location: [facility];  Service: General;  Laterality: Right;  . MASS EXCISION Left 08/29/2018   Procedure: EXCISION LEFT BACK  MASS;  Surgeon: Erroll Luna, MD;  Location: [facility];  Service: General;  Laterality: Left;  . Ovarian cyst resection    . patelar tendon transplants     Left/right  . PORTACATH PLACEMENT Right 02/13/2015   Procedure: INSERTION PORT-A-CATH WITH ULTRASOUND;  Surgeon: Erroll Luna, MD;  Location: [facility];  Service: General;  Laterality: Right;  . PORTACATH PLACEMENT N/A 08/29/2018   Procedure: INSERTION PORT-A-CATH WITH ULTRA SOUND ERAS PATHWAY;  Surgeon: Erroll Luna, MD;  Location: [facility];  Service: General;  Laterality: N/A;  . TOTAL KNEE ARTHROPLASTY  2011   Right  . TUBAL LIGATION      Family History  Problem  Relation Age of Onset  . Cancer Mother        Breast, lung NHL  . Cancer Sister        Multiple myeloma    Social History   Socioeconomic History  . Marital status: Widowed    Spouse name: Not on file  . Number of children: 2  . Years of education: hs  . Highest education level: Not on file  Occupational History  . Occupation: Retired  Scientific laboratory technician  . Financial resource strain: Not on file  . Food insecurity:    Worry: Not on file    Inability: Not on file  . Transportation needs:    Medical: Not on file    Non-medical: Not on file  Tobacco Use  . Smoking status: Never Smoker  . Smokeless tobacco: Never Used  Substance and Sexual Activity  . Alcohol use: Yes    Comment: occassional wine  . Drug use: No  . Sexual activity: Not  on file  Lifestyle  . Physical activity:    Days per week: Not on file    Minutes per session: Not on file  . Stress: Not on file  Relationships  . Social connections:    Talks on phone: Not on file    Gets together: Not on file    Attends religious service: Not on file    Active member of club or organization: Not on file    Attends meetings of clubs or organizations: Not on file    Relationship status: Not on file  . Intimate partner violence:    Fear of current or ex partner: Not on file    Emotionally abused: Not on file    Physically abused: Not on file    Forced sexual activity: Not on file  Other Topics Concern  . Not on file  Social History Narrative   Lives alone.  Has a walker for home use.    Past Medical History, Surgical history, Social history, and Family history were reviewed and updated as appropriate.   Please see review of systems for further details on the patient's review from today.   Review of Systems:  Review of Systems  Constitutional: Positive for fever. Negative for chills and diaphoresis.  HENT: Negative for trouble swallowing.   Respiratory: Positive for shortness of breath. Negative for cough, choking,  chest tightness, wheezing and stridor.   Cardiovascular: Negative for chest pain and palpitations.  Gastrointestinal: Negative for nausea and vomiting.    Objective:   Physical Exam:  There were no vitals taken for this visit. ECOG: 0  Physical Exam Constitutional:      General: She is not in acute distress.    Appearance: She is not diaphoretic.  HENT:     Head: Normocephalic and atraumatic.  Cardiovascular:     Rate and Rhythm: Normal rate and regular rhythm.     Heart sounds: Normal heart sounds. No murmur. No friction rub. No gallop.   Pulmonary:     Effort: Pulmonary effort is normal. No respiratory distress.     Breath sounds: Normal breath sounds. No stridor. No wheezing or rales.  Skin:    General: Skin is warm and dry.     Coloration: Skin is not pale.     Findings: No erythema.  Neurological:     General: No focal deficit present.     Mental Status: She is alert.  Psychiatric:        Behavior: Behavior normal.        Thought Content: Thought content normal.        Judgment: Judgment normal.     Lab Review:     Component Value Date/Time   NA 141 10/06/2018 0942   NA 142 08/29/2017 0838   K 4.1 10/06/2018 0942   K 4.2 08/29/2017 0838   CL 108 10/06/2018 0942   CO2 24 10/06/2018 0942   CO2 23 08/29/2017 0838   GLUCOSE 108 (H) 10/06/2018 0942   GLUCOSE 97 08/29/2017 0838   BUN 26 (H) 10/06/2018 0942   BUN 24.0 08/29/2017 0838   CREATININE 1.07 (H) 10/06/2018 0942   CREATININE 1.0 08/29/2017 0838   CALCIUM 9.1 10/06/2018 0942   CALCIUM 8.9 08/29/2017 0838   PROT 6.3 (L) 10/06/2018 0942   PROT 6.4 08/29/2017 0838   ALBUMIN 3.8 10/06/2018 0942   ALBUMIN 3.8 08/29/2017 0838   AST 20 10/06/2018 0942   AST 18 08/29/2017 0838   ALT 15 10/06/2018 4627  ALT 16 08/29/2017 0838   ALKPHOS 90 10/06/2018 0942   ALKPHOS 93 08/29/2017 0838   BILITOT 0.6 10/06/2018 0942   BILITOT 0.33 08/29/2017 0838   GFRNONAA 51 (L) 10/06/2018 0942   GFRAA 59 (L) 10/06/2018  0942       Component Value Date/Time   WBC 12.7 (H) 10/10/2018 1130   WBC 6.7 08/24/2018 1404   RBC 3.81 (L) 10/10/2018 1130   HGB 11.5 (L) 10/10/2018 1130   HGB 12.2 08/29/2017 0838   HCT 36.0 10/10/2018 1130   HCT 38.3 08/29/2017 0838   PLT 121 (L) 10/10/2018 1130   PLT 245 08/29/2017 0838   MCV 94.5 10/10/2018 1130   MCV 91.4 08/29/2017 0838   MCH 30.2 10/10/2018 1130   MCHC 31.9 10/10/2018 1130   RDW 13.9 10/10/2018 1130   RDW 14.7 (H) 08/29/2017 0838   LYMPHSABS 0.1 (L) 10/10/2018 1130   LYMPHSABS 0.6 (L) 08/29/2017 0838   MONOABS 0.5 10/10/2018 1130   MONOABS 0.6 08/29/2017 0838   EOSABS 0.4 10/10/2018 1130   EOSABS 0.2 08/29/2017 0838   BASOSABS 0.0 10/10/2018 1130   BASOSABS 0.0 08/29/2017 0838   -------------------------------  Imaging from last 24 hours (if applicable):  Radiology interpretation: Dg Chest 2 View  Result Date: 10/10/2018 CLINICAL DATA:  Hypoxia, fever, history lymphoma EXAM: CHEST - 2 VIEW COMPARISON:  08/29/2018 FINDINGS: RIGHT jugular Port-A-Cath with tip projecting over SVC. Enlargement of cardiac silhouette. RIGHT hilar enlargement question adenopathy. Probable mild atelectasis at RIGHT base. Remaining lungs grossly clear. No pleural effusion or pneumothorax. Bones unremarkable. IMPRESSION: Enlargement of cardiac silhouette. Enlargement of RIGHT hilum increased since 08/29/2018 suspicious for adenopathy. Probable mild atelectasis at RIGHT base. Electronically Signed   By: Lavonia Dana M.D.   On: 10/10/2018 11:13

## 2018-10-31 ENCOUNTER — Telehealth: Payer: Self-pay | Admitting: *Deleted

## 2018-10-31 NOTE — Telephone Encounter (Signed)
Received a voicemail from patient stating she is about to leave for an eye Dr appointment. Her eye is swollen, red. She was able to get into her eye dr first thing this morning.  Tried to return call, no answer.

## 2018-11-06 ENCOUNTER — Encounter: Payer: Self-pay | Admitting: Hematology and Oncology

## 2018-11-06 ENCOUNTER — Inpatient Hospital Stay: Payer: Medicare HMO

## 2018-11-06 ENCOUNTER — Inpatient Hospital Stay (HOSPITAL_BASED_OUTPATIENT_CLINIC_OR_DEPARTMENT_OTHER): Payer: Medicare HMO | Admitting: Hematology and Oncology

## 2018-11-06 ENCOUNTER — Inpatient Hospital Stay: Payer: Medicare HMO | Attending: Hematology and Oncology

## 2018-11-06 VITALS — BP 135/80 | HR 78 | Temp 97.4°F | Resp 18 | Ht 61.0 in | Wt 143.0 lb

## 2018-11-06 VITALS — BP 127/74 | HR 91 | Temp 98.3°F | Resp 18

## 2018-11-06 DIAGNOSIS — R300 Dysuria: Secondary | ICD-10-CM | POA: Insufficient documentation

## 2018-11-06 DIAGNOSIS — Z792 Long term (current) use of antibiotics: Secondary | ICD-10-CM | POA: Insufficient documentation

## 2018-11-06 DIAGNOSIS — Z79899 Other long term (current) drug therapy: Secondary | ICD-10-CM

## 2018-11-06 DIAGNOSIS — R5382 Chronic fatigue, unspecified: Secondary | ICD-10-CM | POA: Diagnosis not present

## 2018-11-06 DIAGNOSIS — Z5112 Encounter for antineoplastic immunotherapy: Secondary | ICD-10-CM | POA: Insufficient documentation

## 2018-11-06 DIAGNOSIS — H00013 Hordeolum externum right eye, unspecified eyelid: Secondary | ICD-10-CM | POA: Insufficient documentation

## 2018-11-06 DIAGNOSIS — Z87440 Personal history of urinary (tract) infections: Secondary | ICD-10-CM | POA: Diagnosis not present

## 2018-11-06 DIAGNOSIS — C8238 Follicular lymphoma grade IIIa, lymph nodes of multiple sites: Secondary | ICD-10-CM

## 2018-11-06 DIAGNOSIS — Z7901 Long term (current) use of anticoagulants: Secondary | ICD-10-CM

## 2018-11-06 DIAGNOSIS — R5383 Other fatigue: Secondary | ICD-10-CM

## 2018-11-06 DIAGNOSIS — R350 Frequency of micturition: Secondary | ICD-10-CM | POA: Diagnosis not present

## 2018-11-06 DIAGNOSIS — Z5111 Encounter for antineoplastic chemotherapy: Secondary | ICD-10-CM | POA: Diagnosis not present

## 2018-11-06 LAB — CMP (CANCER CENTER ONLY)
ALT: 13 U/L (ref 0–44)
AST: 15 U/L (ref 15–41)
Albumin: 3.8 g/dL (ref 3.5–5.0)
Alkaline Phosphatase: 89 U/L (ref 38–126)
Anion gap: 9 (ref 5–15)
BUN: 22 mg/dL (ref 8–23)
CO2: 24 mmol/L (ref 22–32)
Calcium: 9.1 mg/dL (ref 8.9–10.3)
Chloride: 109 mmol/L (ref 98–111)
Creatinine: 0.95 mg/dL (ref 0.44–1.00)
GFR, Est AFR Am: >60 mL/min (ref >60)
GFR, Estimated: 59 mL/min — ABNORMAL LOW (ref >60)
Glucose, Bld: 77 mg/dL (ref 70–99)
Potassium: 4.4 mmol/L (ref 3.5–5.1)
Sodium: 142 mmol/L (ref 135–145)
Total Bilirubin: 0.6 mg/dL (ref 0.3–1.2)
Total Protein: 6.2 g/dL — ABNORMAL LOW (ref 6.5–8.1)

## 2018-11-06 LAB — CBC WITH DIFFERENTIAL (CANCER CENTER ONLY)
Abs Immature Granulocytes: 0.01 10*3/uL (ref 0.00–0.07)
Basophils Absolute: 0 10*3/uL (ref 0.0–0.1)
Basophils Relative: 1 %
Eosinophils Absolute: 0.3 10*3/uL (ref 0.0–0.5)
Eosinophils Relative: 7 %
HCT: 37.3 % (ref 36.0–46.0)
Hemoglobin: 12.3 g/dL (ref 12.0–15.0)
Immature Granulocytes: 0 %
Lymphocytes Relative: 8 %
Lymphs Abs: 0.4 10*3/uL — ABNORMAL LOW (ref 0.7–4.0)
MCH: 31 pg (ref 26.0–34.0)
MCHC: 33 g/dL (ref 30.0–36.0)
MCV: 94 fL (ref 80.0–100.0)
Monocytes Absolute: 0.5 10*3/uL (ref 0.1–1.0)
Monocytes Relative: 12 %
Neutro Abs: 3.1 10*3/uL (ref 1.7–7.7)
Neutrophils Relative %: 72 %
Platelet Count: 173 10*3/uL (ref 150–400)
RBC: 3.97 MIL/uL (ref 3.87–5.11)
RDW: 14.4 % (ref 11.5–15.5)
WBC Count: 4.3 10*3/uL (ref 4.0–10.5)
nRBC: 0 % (ref 0.0–0.2)

## 2018-11-06 LAB — URINALYSIS, COMPLETE (UACMP) WITH MICROSCOPIC
Bilirubin Urine: NEGATIVE
Glucose, UA: NEGATIVE mg/dL
Ketones, ur: NEGATIVE mg/dL
Leukocytes, UA: NEGATIVE
Nitrite: NEGATIVE
Protein, ur: NEGATIVE mg/dL
Specific Gravity, Urine: 1.016 (ref 1.005–1.030)
pH: 5 (ref 5.0–8.0)

## 2018-11-06 MED ORDER — SODIUM CHLORIDE 0.9 % IV SOLN
375.0000 mg/m2 | Freq: Once | INTRAVENOUS | Status: AC
  Administered 2018-11-06: 600 mg via INTRAVENOUS
  Filled 2018-11-06: qty 10

## 2018-11-06 MED ORDER — SODIUM CHLORIDE 0.9 % IV SOLN
Freq: Once | INTRAVENOUS | Status: AC
  Administered 2018-11-06: 11:00:00 via INTRAVENOUS
  Filled 2018-11-06: qty 250

## 2018-11-06 MED ORDER — PALONOSETRON HCL INJECTION 0.25 MG/5ML
INTRAVENOUS | Status: AC
  Filled 2018-11-06: qty 5

## 2018-11-06 MED ORDER — PALONOSETRON HCL INJECTION 0.25 MG/5ML
0.2500 mg | Freq: Once | INTRAVENOUS | Status: AC
  Administered 2018-11-06: 0.25 mg via INTRAVENOUS

## 2018-11-06 MED ORDER — DEXAMETHASONE SODIUM PHOSPHATE 10 MG/ML IJ SOLN
INTRAMUSCULAR | Status: AC
  Filled 2018-11-06: qty 1

## 2018-11-06 MED ORDER — ACETAMINOPHEN 325 MG PO TABS
ORAL_TABLET | ORAL | Status: AC
  Filled 2018-11-06: qty 1

## 2018-11-06 MED ORDER — SODIUM CHLORIDE 0.9% FLUSH
10.0000 mL | INTRAVENOUS | Status: DC | PRN
  Administered 2018-11-06: 10 mL
  Filled 2018-11-06: qty 10

## 2018-11-06 MED ORDER — DEXAMETHASONE SODIUM PHOSPHATE 10 MG/ML IJ SOLN
10.0000 mg | Freq: Once | INTRAMUSCULAR | Status: AC
  Administered 2018-11-06: 10 mg via INTRAVENOUS

## 2018-11-06 MED ORDER — DIPHENHYDRAMINE HCL 25 MG PO CAPS
ORAL_CAPSULE | ORAL | Status: AC
  Filled 2018-11-06: qty 2

## 2018-11-06 MED ORDER — DIPHENHYDRAMINE HCL 25 MG PO CAPS
50.0000 mg | ORAL_CAPSULE | Freq: Once | ORAL | Status: AC
  Administered 2018-11-06: 50 mg via ORAL

## 2018-11-06 MED ORDER — SODIUM CHLORIDE 0.9 % IV SOLN
90.0000 mg/m2 | Freq: Once | INTRAVENOUS | Status: AC
  Administered 2018-11-06: 150 mg via INTRAVENOUS
  Filled 2018-11-06: qty 6

## 2018-11-06 MED ORDER — HEPARIN SOD (PORK) LOCK FLUSH 100 UNIT/ML IV SOLN
500.0000 [IU] | Freq: Once | INTRAVENOUS | Status: AC | PRN
  Administered 2018-11-06: 500 [IU]
  Filled 2018-11-06: qty 5

## 2018-11-06 MED ORDER — ACETAMINOPHEN 325 MG PO TABS
650.0000 mg | ORAL_TABLET | Freq: Once | ORAL | Status: AC
  Administered 2018-11-06: 650 mg via ORAL

## 2018-11-06 NOTE — Assessment & Plan Note (Signed)
Her blood count is satisfactory and she has a little bit of minor complaints but overall feeling good The subcutaneous nodule over her left scapula is no longer palpable We will proceed with treatment as scheduled and I plan to repeat imaging study after current cycle of therapy

## 2018-11-06 NOTE — Progress Notes (Signed)
[facility] OFFICE PROGRESS NOTE  Patient Care Team: Reubin Milan, MD as PCP - General (Internal Medicine) Carola Frost, RN as Registered Nurse (Medical Oncology) [name], MD as Attending Physician (Internal Medicine)  ASSESSMENT & PLAN:  Grade 3a follicular lymphoma of lymph nodes of multiple regions [facility]) Her blood count is satisfactory and she has a little bit of minor complaints but overall feeling good The subcutaneous nodule over her left scapula is no longer palpable We will proceed with treatment as scheduled and I plan to repeat imaging study after current cycle of therapy  Dysuria She is prone to get recurrent UTI She is actually taking antibiotic for her recent stye infection We will check urinalysis and urine culture just to be sure  Other fatigue She has chronic fatigue, likely due to recent side effects of treatment She is being prescribed thyroid replacement therapy and she will continue the same   Orders Placed This Encounter  Procedures  . Urine Culture    Standing Status:   Future    Number of Occurrences:   1    Standing Expiration Date:   12/11/2019  . NM PET Image Restag (PS) Skull Base To Thigh    Standing Status:   Future    Standing Expiration Date:   11/07/2019    Order Specific Question:   If indicated for the ordered procedure, I authorize the administration of a radiopharmaceutical per Radiology protocol    Answer:   Yes    Order Specific Question:   Preferred imaging location?    Answer:   [facility]    Order Specific Question:   Radiology Contrast Protocol - do NOT remove file path    Answer:   [email or web address]  . Urinalysis, Complete w Microscopic    Standing Status:   Future    Number of Occurrences:   1    Standing Expiration Date:   11/07/2019    INTERVAL HISTORY: Please see below for problem oriented charting. She returns with her husband for further follow-up, to be seen  prior to cycle 3 of chemotherapy She complained of fatigue She had recent stye infection on the right side and was prescribed Keflex which improvement She complained of mild skin itching without rash She felt that she is responding to treatment as the lymph nodes are getting better She complains of urinary frequency but no hematuria She complained of left ear discomfort without hearing changes. Denies nausea or constipation with chemotherapy.  SUMMARY OF ONCOLOGIC HISTORY:   Grade 3a follicular lymphoma of lymph nodes of multiple regions ([place])   01/21/2015 Surgery    She underwent excisional lymph node biopsy that came back follicular lymphoma grade 3    01/21/2015 Pathology Results    Accession: [id number] biopsy confirmed follicular lymphoma    5/59/7416 Imaging    Echocardiogram showed ejection fraction of 55-60%    02/11/2015 Imaging     PET CT scan show possible splenic involvement and diffuse lymphadenopathy throughout    02/11/2015 Bone Marrow Biopsy     bone marrow biopsy was performed and is involved by lymphoma with translocation of igH/BCL2    02/13/2015 Procedure    She had port placement.    02/17/2015 - 06/02/2015 Chemotherapy    She received R-CHOP chemo x 6    02/17/2015 Adverse Reaction    She had mild infusion reaction with cycle 1 of treatment.    04/18/2015 Imaging    PET CT scan showed near complete  response to treatment.    04/22/2015 Adverse Reaction    Vincristine dose was reduced by 50% due to neuropathy from cycle 4 onwards    06/25/2015 - 07/06/2015 Hospital Admission    She was hospitalized for recent sepsis/bacteremia and a fib with RVR    07/11/2015 Imaging    repeat PEt scan showed complete response to Rx    07/14/2015 - 06/16/2017 Chemotherapy    She received maintenance Rituximab every 60 days    12/15/2015 Imaging    PET CT showed no evidence of cancer recurrence    06/15/2016 PET scan    No evidence of active lymphoma on skullbase to thigh FDG PET  scan. Small LEFT periaortic retroperitoneal lymph nodes without significant metabolic activity ( Deauville 1). No change from prior    03/24/2017 Imaging    1. Borderline enlarged abdominal retroperitoneal lymph nodes, stable. No new adenopathy in the chest, abdomen or pelvis. 2. Aortic atherosclerosis (ICD10-170.0). 3. Enlarged pulmonary arteries, indicative of pulmonary arterial hypertension.    09/14/2017 PET scan    Stable exam. No evidence of active lymphoma or other acute findings. (Deauville score 1)    08/08/2018 PET scan    1. Evidence of progressive/recurrent disease, as evidenced by new hypermetabolic nodes and subcutaneous nodule/nodes throughout left chest and abdomen. (Deauville 5). 2. Likely physiologic right nasopharyngeal hypermetabolism. Recommend attention on follow-up. 3. Incidental findings, including pulmonary artery enlargement, suggesting pulmonary arterial hypertension, stable left lower lobe pulmonary nodule, and aortic Atherosclerosis (ICD10-I70.0).    08/29/2018 Initial Biopsy    Soft tissue simple excision left back: Follicular lymphoma grade 1-2 positive for CD20, PAX5, bcl-6, and bcl-2.  Ki-67 30%    09/04/2018 -  Chemotherapy    The patient had Bendamustine and Rituximab     REVIEW OF SYSTEMS:   Constitutional: Denies fevers, chills or abnormal weight loss Eyes: Denies blurriness of vision Ears, nose, mouth, throat, and face: Denies mucositis or sore throat Respiratory: Denies cough, dyspnea or wheezes Cardiovascular: Denies palpitation, chest discomfort or lower extremity swelling Gastrointestinal:  Denies nausea, heartburn or change in bowel habits Skin: Denies abnormal skin rashes Lymphatics: Denies new lymphadenopathy or easy bruising Neurological:Denies numbness, tingling or new weaknesses Behavioral/Psych: Mood is stable, no new changes  All other systems were reviewed with the patient and are negative.  I have reviewed the past medical  history, past surgical history, social history and family history with the patient and they are unchanged from previous note.  ALLERGIES:  has No Known Allergies.  MEDICATIONS:  Current Outpatient Medications  Medication Sig Dispense Refill  . acetaminophen (TYLENOL) 500 MG tablet Take 500 mg by mouth every 6 (six) hours as needed for moderate pain or headache.    Marland Kitchen acyclovir (ZOVIRAX) 400 MG tablet Take 1 tablet (400 mg total) by mouth daily. 30 tablet 3  . allopurinol (ZYLOPRIM) 300 MG tablet Take 1 tablet (300 mg total) by mouth daily. 30 tablet 3  . amoxicillin (AMOXIL) 500 MG capsule Take 2,000 mg by mouth See admin instructions. Take 2000 mg by mouth 1 hour prior to dental appointment    . apixaban (ELIQUIS) 5 MG TABS tablet Take 5 mg by mouth 2 (two) times daily.     . bisacodyl (DULCOLAX) 5 MG EC tablet Take 5 mg by mouth daily as needed for moderate constipation.    . diclofenac sodium (VOLTAREN) 1 % GEL Apply 2 g topically 3 (three) times daily as needed (knee pain). (Patient taking differently: Apply 2 g  topically 3 (three) times daily as needed (for back or knee pain). ) 100 g 0  . levothyroxine (SYNTHROID, LEVOTHROID) 112 MCG tablet Take 112 mcg by mouth daily.   9  . lidocaine-prilocaine (EMLA) cream Apply to affected area once 30 g 3  . LORazepam (ATIVAN) 0.5 MG tablet Take 1 tablet (0.5 mg total) by mouth every 6 (six) hours as needed (Nausea or vomiting). 30 tablet 0  . omeprazole (PRILOSEC) 40 MG capsule Take 1 capsule (40 mg total) by mouth daily. 90 capsule 1  . ondansetron (ZOFRAN) 8 MG tablet Take 1 tablet (8 mg total) by mouth 2 (two) times daily as needed for refractory nausea / vomiting. Start on day 2 after bendamustine chemotherapy. 30 tablet 1  . prochlorperazine (COMPAZINE) 10 MG tablet Take 1 tablet (10 mg total) by mouth every 6 (six) hours as needed (Nausea or vomiting). 30 tablet 1   No current facility-administered medications for this visit.     Facility-Administered Medications Ordered in Other Visits  Medication Dose Route Frequency Provider Last Rate Last Dose  . ondansetron (ZOFRAN) 8 mg in sodium chloride 0.9 % 50 mL IVPB   Intravenous Once Shavar Gorka, MD      . sodium chloride 0.9 % injection 10 mL  10 mL Intravenous PRN Alvy Bimler, Gauri Galvao, MD   10 mL at 04/05/17 1315    PHYSICAL EXAMINATION: ECOG PERFORMANCE STATUS: 1 - Symptomatic but completely ambulatory  Vitals:   11/06/18 1001  BP: 135/80  Pulse: 78  Resp: 18  Temp: (!) 97.4 F (36.3 C)  SpO2: 100%   Filed Weights   11/06/18 1001  Weight: 143 lb (64.9 kg)    GENERAL:alert, no distress and comfortable SKIN: skin color, texture, turgor are normal, no rashes or significant lesions EYES: normal, Conjunctiva are pink and non-injected, sclera clear OROPHARYNX:no exudate, no erythema and lips, buccal mucosa, and tongue normal  NECK: supple, thyroid normal size, non-tender, without nodularity LYMPH:  no palpable lymphadenopathy in the cervical, axillary or inguinal LUNGS: clear to auscultation and percussion with normal breathing effort HEART: regular rate & rhythm and no murmurs and no lower extremity edema ABDOMEN:abdomen soft, non-tender and normal bowel sounds Musculoskeletal:no cyanosis of digits and no clubbing  NEURO: alert & oriented x 3 with fluent speech, no focal motor/sensory deficits  LABORATORY DATA:  I have reviewed the data as listed    Component Value Date/Time   NA 141 10/06/2018 0942   NA 142 08/29/2017 0838   K 4.1 10/06/2018 0942   K 4.2 08/29/2017 0838   CL 108 10/06/2018 0942   CO2 24 10/06/2018 0942   CO2 23 08/29/2017 0838   GLUCOSE 108 (H) 10/06/2018 0942   GLUCOSE 97 08/29/2017 0838   BUN 26 (H) 10/06/2018 0942   BUN 24.0 08/29/2017 0838   CREATININE 1.07 (H) 10/06/2018 0942   CREATININE 1.0 08/29/2017 0838   CALCIUM 9.1 10/06/2018 0942   CALCIUM 8.9 08/29/2017 0838   PROT 6.3 (L) 10/06/2018 0942   PROT 6.4 08/29/2017 0838    ALBUMIN 3.8 10/06/2018 0942   ALBUMIN 3.8 08/29/2017 0838   AST 20 10/06/2018 0942   AST 18 08/29/2017 0838   ALT 15 10/06/2018 0942   ALT 16 08/29/2017 0838   ALKPHOS 90 10/06/2018 0942   ALKPHOS 93 08/29/2017 0838   BILITOT 0.6 10/06/2018 0942   BILITOT 0.33 08/29/2017 0838   GFRNONAA 51 (L) 10/06/2018 0942   GFRAA 59 (L) 10/06/2018 0942    No results  found for: SPEP, UPEP  Lab Results  Component Value Date   WBC 4.3 11/06/2018   NEUTROABS 3.1 11/06/2018   HGB 12.3 11/06/2018   HCT 37.3 11/06/2018   MCV 94.0 11/06/2018   PLT 173 11/06/2018      Chemistry      Component Value Date/Time   NA 141 10/06/2018 0942   NA 142 08/29/2017 0838   K 4.1 10/06/2018 0942   K 4.2 08/29/2017 0838   CL 108 10/06/2018 0942   CO2 24 10/06/2018 0942   CO2 23 08/29/2017 0838   BUN 26 (H) 10/06/2018 0942   BUN 24.0 08/29/2017 0838   CREATININE 1.07 (H) 10/06/2018 0942   CREATININE 1.0 08/29/2017 0838      Component Value Date/Time   CALCIUM 9.1 10/06/2018 0942   CALCIUM 8.9 08/29/2017 0838   ALKPHOS 90 10/06/2018 0942   ALKPHOS 93 08/29/2017 0838   AST 20 10/06/2018 0942   AST 18 08/29/2017 0838   ALT 15 10/06/2018 0942   ALT 16 08/29/2017 0838   BILITOT 0.6 10/06/2018 0942   BILITOT 0.33 08/29/2017 0838       RADIOGRAPHIC STUDIES: I have personally reviewed the radiological images as listed and agreed with the findings in the report. Dg Chest 2 View  Result Date: 10/10/2018 CLINICAL DATA:  Hypoxia, fever, history lymphoma EXAM: CHEST - 2 VIEW COMPARISON:  08/29/2018 FINDINGS: RIGHT jugular Port-A-Cath with tip projecting over SVC. Enlargement of cardiac silhouette. RIGHT hilar enlargement question adenopathy. Probable mild atelectasis at RIGHT base. Remaining lungs grossly clear. No pleural effusion or pneumothorax. Bones unremarkable. IMPRESSION: Enlargement of cardiac silhouette. Enlargement of RIGHT hilum increased since 08/29/2018 suspicious for adenopathy. Probable  mild atelectasis at RIGHT base. Electronically Signed   By: Lavonia Dana M.D.   On: 10/10/2018 11:13    All questions were answered. The patient knows to call the clinic with any problems, questions or concerns. No barriers to learning was detected.  I spent 15 minutes counseling the patient face to face. The total time spent in the appointment was 20 minutes and more than 50% was on counseling and review of test results  [name], MD 11/06/2018 10:14 AM

## 2018-11-06 NOTE — Assessment & Plan Note (Signed)
She is prone to get recurrent UTI She is actually taking antibiotic for her recent stye infection We will check urinalysis and urine culture just to be sure

## 2018-11-06 NOTE — Assessment & Plan Note (Signed)
She has chronic fatigue, likely due to recent side effects of treatment She is being prescribed thyroid replacement therapy and she will continue the same

## 2018-11-06 NOTE — Patient Instructions (Signed)
[facility] Discharge Instructions for Patients Receiving Chemotherapy  Today you received the following chemotherapy agents:  Rituxan and Bendeka.  To help prevent nausea and vomiting after your treatment, we encourage you to take your nausea medication as directed.   If you develop nausea and vomiting that is not controlled by your nausea medication, call the clinic.   BELOW ARE SYMPTOMS THAT SHOULD BE REPORTED IMMEDIATELY:  *FEVER GREATER THAN 100.5 F  *CHILLS WITH OR WITHOUT FEVER  NAUSEA AND VOMITING THAT IS NOT CONTROLLED WITH YOUR NAUSEA MEDICATION  *UNUSUAL SHORTNESS OF BREATH  *UNUSUAL BRUISING OR BLEEDING  TENDERNESS IN MOUTH AND THROAT WITH OR WITHOUT PRESENCE OF ULCERS  *URINARY PROBLEMS  *BOWEL PROBLEMS  UNUSUAL RASH Items with * indicate a potential emergency and should be followed up as soon as possible.  Feel free to call the clinic should you have any questions or concerns. The clinic phone number is [phone].  Please show the CHEMO ALERT CARD at check-in to the Emergency Department and triage nurse.   

## 2018-11-07 ENCOUNTER — Inpatient Hospital Stay: Payer: Medicare HMO

## 2018-11-07 VITALS — BP 146/80 | HR 91 | Temp 98.1°F | Resp 20

## 2018-11-07 DIAGNOSIS — C8238 Follicular lymphoma grade IIIa, lymph nodes of multiple sites: Secondary | ICD-10-CM

## 2018-11-07 DIAGNOSIS — Z5111 Encounter for antineoplastic chemotherapy: Secondary | ICD-10-CM | POA: Diagnosis not present

## 2018-11-07 LAB — URINE CULTURE: Culture: NO GROWTH

## 2018-11-07 MED ORDER — DEXAMETHASONE SODIUM PHOSPHATE 10 MG/ML IJ SOLN
INTRAMUSCULAR | Status: AC
  Filled 2018-11-07: qty 1

## 2018-11-07 MED ORDER — SODIUM CHLORIDE 0.9 % IV SOLN
90.0000 mg/m2 | Freq: Once | INTRAVENOUS | Status: AC
  Administered 2018-11-07: 150 mg via INTRAVENOUS
  Filled 2018-11-07: qty 6

## 2018-11-07 MED ORDER — HEPARIN SOD (PORK) LOCK FLUSH 100 UNIT/ML IV SOLN
500.0000 [IU] | Freq: Once | INTRAVENOUS | Status: AC | PRN
  Administered 2018-11-07: 500 [IU]
  Filled 2018-11-07: qty 5

## 2018-11-07 MED ORDER — PEGFILGRASTIM 6 MG/0.6ML ~~LOC~~ PSKT
PREFILLED_SYRINGE | SUBCUTANEOUS | Status: AC
  Filled 2018-11-07: qty 0.6

## 2018-11-07 MED ORDER — SODIUM CHLORIDE 0.9% FLUSH
10.0000 mL | INTRAVENOUS | Status: DC | PRN
  Administered 2018-11-07: 10 mL
  Filled 2018-11-07: qty 10

## 2018-11-07 MED ORDER — SODIUM CHLORIDE 0.9 % IV SOLN
Freq: Once | INTRAVENOUS | Status: AC
  Administered 2018-11-07: 14:00:00 via INTRAVENOUS
  Filled 2018-11-07: qty 250

## 2018-11-07 MED ORDER — DEXAMETHASONE SODIUM PHOSPHATE 10 MG/ML IJ SOLN
10.0000 mg | Freq: Once | INTRAMUSCULAR | Status: AC
  Administered 2018-11-07: 10 mg via INTRAVENOUS

## 2018-11-07 MED ORDER — PEGFILGRASTIM 6 MG/0.6ML ~~LOC~~ PSKT
6.0000 mg | PREFILLED_SYRINGE | Freq: Once | SUBCUTANEOUS | Status: AC
  Administered 2018-11-07: 6 mg via SUBCUTANEOUS

## 2018-11-07 NOTE — Patient Instructions (Signed)
[facility] Discharge Instructions for Patients Receiving Chemotherapy  Today you received the following chemotherapy agents Bendeka.  To help prevent nausea and vomiting after your treatment, we encourage you to take your nausea medication as directed.   If you develop nausea and vomiting that is not controlled by your nausea medication, call the clinic.   BELOW ARE SYMPTOMS THAT SHOULD BE REPORTED IMMEDIATELY:  *FEVER GREATER THAN 100.5 F  *CHILLS WITH OR WITHOUT FEVER  NAUSEA AND VOMITING THAT IS NOT CONTROLLED WITH YOUR NAUSEA MEDICATION  *UNUSUAL SHORTNESS OF BREATH  *UNUSUAL BRUISING OR BLEEDING  TENDERNESS IN MOUTH AND THROAT WITH OR WITHOUT PRESENCE OF ULCERS  *URINARY PROBLEMS  *BOWEL PROBLEMS  UNUSUAL RASH Items with * indicate a potential emergency and should be followed up as soon as possible.  Feel free to call the clinic should you have any questions or concerns. The clinic phone number is [phone].  Please show the CHEMO ALERT CARD at check-in to the Emergency Department and triage nurse.   

## 2018-11-30 ENCOUNTER — Telehealth: Payer: Self-pay

## 2018-11-30 NOTE — Telephone Encounter (Signed)
Called back and canceled appts. Told her that we are waiting on insurance approval for PET. She verbalized understanding.

## 2018-11-30 NOTE — Telephone Encounter (Signed)
She called and left a message to call her.  Called back. Her PET scan was canceled due to not having authorization for tomorrow. She still has labs scheduled for tomorrow and on 2/17 appt with  Dr. Alvy Bimler and treatment. Do we need to reschedule appts?  Message sent to [facility] to check on authorization for PET scan

## 2018-11-30 NOTE — Telephone Encounter (Signed)
Yes, better reschedule everything

## 2018-12-01 ENCOUNTER — Inpatient Hospital Stay: Payer: Medicare HMO

## 2018-12-01 ENCOUNTER — Ambulatory Visit (HOSPITAL_COMMUNITY): Payer: Medicare HMO

## 2018-12-04 ENCOUNTER — Ambulatory Visit: Payer: Medicare HMO

## 2018-12-04 ENCOUNTER — Inpatient Hospital Stay: Payer: Medicare HMO | Admitting: Hematology and Oncology

## 2018-12-05 ENCOUNTER — Inpatient Hospital Stay: Payer: Medicare HMO

## 2018-12-05 ENCOUNTER — Telehealth: Payer: Self-pay | Admitting: *Deleted

## 2018-12-05 NOTE — Telephone Encounter (Signed)
Patient called and left a message in regards to PET scan status. She was told it was approved on Friday until April 20th. Advised patient we have to have a peer to peer completed it was denied at this time  But we will know more tomorrow. Patient is very eager to get this resolved. I apologized and assured her we are working on this.

## 2018-12-06 ENCOUNTER — Telehealth: Payer: Self-pay

## 2018-12-06 ENCOUNTER — Other Ambulatory Visit: Payer: Self-pay | Admitting: Hematology and Oncology

## 2018-12-06 DIAGNOSIS — C8238 Follicular lymphoma grade IIIa, lymph nodes of multiple sites: Secondary | ICD-10-CM

## 2018-12-06 NOTE — Telephone Encounter (Signed)
-----   Message from [name], MD sent at 12/06/2018 11:57 AM EST ----- Regarding: Ct approved I spoke with medical director for her insurance. PET will not be approved  I have gotten approval for Ct chest, abdomen and pelvis with contrast Approval code is [id number] Please schedule labs, flush and CT as soon as you can and let me know when so I can see her back

## 2018-12-06 NOTE — Telephone Encounter (Signed)
Called and given below message. She will call radiology scheduling and schedule scan.

## 2018-12-07 ENCOUNTER — Telehealth: Payer: Self-pay

## 2018-12-07 ENCOUNTER — Telehealth: Payer: Self-pay | Admitting: Hematology and Oncology

## 2018-12-07 NOTE — Telephone Encounter (Signed)
Scheduled appt for 2/27 per sch message - pt is aware of appt date and time

## 2018-12-07 NOTE — Telephone Encounter (Signed)
She called and left a message that PET scan was approved yesterday with her insurance company and she has a case #.  Given above message to Dr. Alvy Bimler. Called back and ask Ms. Lorn Junes to send approval letter to office. She does not have anything. Ask her to call the office if she gets approval letter and to keep CT scan appt. She verbalized understanding.

## 2018-12-07 NOTE — Telephone Encounter (Signed)
She called and left a message to call her. She said her insurance company has approved for her to have a PET scan and they have faxed the authorization.  Called back. Told her that the office has not received a fax from her insurance company. Given fax number to Dr. Alvy Bimler office.  Called back and told her I received a fax from the insurance company for approval for the CT scans. Ms. Carrie [name] said the insurance company will fax authorization for PET.

## 2018-12-13 ENCOUNTER — Inpatient Hospital Stay: Payer: Medicare HMO | Attending: Hematology and Oncology

## 2018-12-13 ENCOUNTER — Encounter (HOSPITAL_COMMUNITY): Payer: Self-pay

## 2018-12-13 ENCOUNTER — Ambulatory Visit (HOSPITAL_COMMUNITY)
Admission: RE | Admit: 2018-12-13 | Discharge: 2018-12-13 | Disposition: A | Discharge status: Home / Self Care | Payer: Medicare HMO | Source: Ambulatory Visit | Attending: Hematology and Oncology | Admitting: Hematology and Oncology

## 2018-12-13 ENCOUNTER — Inpatient Hospital Stay: Payer: Medicare HMO

## 2018-12-13 DIAGNOSIS — D739 Disease of spleen, unspecified: Secondary | ICD-10-CM | POA: Diagnosis not present

## 2018-12-13 DIAGNOSIS — C8238 Follicular lymphoma grade IIIa, lymph nodes of multiple sites: Secondary | ICD-10-CM

## 2018-12-13 DIAGNOSIS — Z9221 Personal history of antineoplastic chemotherapy: Secondary | ICD-10-CM | POA: Diagnosis not present

## 2018-12-13 DIAGNOSIS — N183 Chronic kidney disease, stage 3 (moderate): Secondary | ICD-10-CM | POA: Diagnosis not present

## 2018-12-13 DIAGNOSIS — R350 Frequency of micturition: Secondary | ICD-10-CM | POA: Diagnosis not present

## 2018-12-13 DIAGNOSIS — R300 Dysuria: Secondary | ICD-10-CM | POA: Diagnosis not present

## 2018-12-13 DIAGNOSIS — R918 Other nonspecific abnormal finding of lung field: Secondary | ICD-10-CM | POA: Diagnosis not present

## 2018-12-13 LAB — CBC WITH DIFFERENTIAL (CANCER CENTER ONLY)
Abs Immature Granulocytes: 0.01 10*3/uL (ref 0.00–0.07)
Basophils Absolute: 0 10*3/uL (ref 0.0–0.1)
Basophils Relative: 0 %
Eosinophils Absolute: 0.2 10*3/uL (ref 0.0–0.5)
Eosinophils Relative: 4 %
HCT: 38.3 % (ref 36.0–46.0)
Hemoglobin: 12.6 g/dL (ref 12.0–15.0)
Immature Granulocytes: 0 %
Lymphocytes Relative: 5 %
Lymphs Abs: 0.3 10*3/uL — ABNORMAL LOW (ref 0.7–4.0)
MCH: 31.6 pg (ref 26.0–34.0)
MCHC: 32.9 g/dL (ref 30.0–36.0)
MCV: 96 fL (ref 80.0–100.0)
Monocytes Absolute: 0.5 10*3/uL (ref 0.1–1.0)
Monocytes Relative: 11 %
Neutro Abs: 4 10*3/uL (ref 1.7–7.7)
Neutrophils Relative %: 80 %
Platelet Count: 197 10*3/uL (ref 150–400)
RBC: 3.99 MIL/uL (ref 3.87–5.11)
RDW: 14.9 % (ref 11.5–15.5)
WBC Count: 5 10*3/uL (ref 4.0–10.5)
nRBC: 0 % (ref 0.0–0.2)

## 2018-12-13 LAB — CMP (CANCER CENTER ONLY)
ALT: 12 U/L (ref 0–44)
AST: 20 U/L (ref 15–41)
Albumin: 4.1 g/dL (ref 3.5–5.0)
Alkaline Phosphatase: 83 U/L (ref 38–126)
Anion gap: 9 (ref 5–15)
BUN: 25 mg/dL — ABNORMAL HIGH (ref 8–23)
CO2: 26 mmol/L (ref 22–32)
Calcium: 9.1 mg/dL (ref 8.9–10.3)
Chloride: 105 mmol/L (ref 98–111)
Creatinine: 1.07 mg/dL — ABNORMAL HIGH (ref 0.44–1.00)
GFR, Est AFR Am: 59 mL/min — ABNORMAL LOW (ref >60)
GFR, Estimated: 51 mL/min — ABNORMAL LOW (ref >60)
Glucose, Bld: 100 mg/dL — ABNORMAL HIGH (ref 70–99)
Potassium: 4.4 mmol/L (ref 3.5–5.1)
Sodium: 140 mmol/L (ref 135–145)
Total Bilirubin: 0.6 mg/dL (ref 0.3–1.2)
Total Protein: 6.5 g/dL (ref 6.5–8.1)

## 2018-12-13 MED ORDER — SODIUM CHLORIDE (PF) 0.9 % IJ SOLN
INTRAMUSCULAR | Status: AC
  Filled 2018-12-13: qty 50

## 2018-12-13 MED ORDER — SODIUM CHLORIDE 0.9% FLUSH
10.0000 mL | Freq: Once | INTRAVENOUS | Status: AC
  Administered 2018-12-13: 10 mL
  Filled 2018-12-13: qty 10

## 2018-12-13 MED ORDER — HEPARIN SOD (PORK) LOCK FLUSH 100 UNIT/ML IV SOLN
500.0000 [IU] | Freq: Once | INTRAVENOUS | Status: AC
  Administered 2018-12-13: 500 [IU] via INTRAVENOUS

## 2018-12-13 MED ORDER — IOHEXOL 300 MG/ML  SOLN
100.0000 mL | Freq: Once | INTRAMUSCULAR | Status: AC | PRN
  Administered 2018-12-13: 100 mL via INTRAVENOUS

## 2018-12-13 MED ORDER — HEPARIN SOD (PORK) LOCK FLUSH 100 UNIT/ML IV SOLN
INTRAVENOUS | Status: AC
  Administered 2018-12-13: 500 [IU] via INTRAVENOUS
  Filled 2018-12-13: qty 5

## 2018-12-14 ENCOUNTER — Inpatient Hospital Stay (HOSPITAL_BASED_OUTPATIENT_CLINIC_OR_DEPARTMENT_OTHER): Payer: Medicare HMO | Admitting: Hematology and Oncology

## 2018-12-14 ENCOUNTER — Telehealth: Payer: Self-pay | Admitting: Hematology and Oncology

## 2018-12-14 ENCOUNTER — Inpatient Hospital Stay: Payer: Medicare HMO

## 2018-12-14 ENCOUNTER — Encounter: Payer: Self-pay | Admitting: Hematology and Oncology

## 2018-12-14 VITALS — BP 142/67 | HR 81 | Temp 97.5°F | Resp 18 | Ht 61.0 in | Wt 145.4 lb

## 2018-12-14 DIAGNOSIS — R300 Dysuria: Secondary | ICD-10-CM

## 2018-12-14 DIAGNOSIS — C8238 Follicular lymphoma grade IIIa, lymph nodes of multiple sites: Secondary | ICD-10-CM

## 2018-12-14 DIAGNOSIS — R530 Neoplastic (malignant) related fatigue: Secondary | ICD-10-CM | POA: Diagnosis not present

## 2018-12-14 DIAGNOSIS — Z9221 Personal history of antineoplastic chemotherapy: Secondary | ICD-10-CM | POA: Diagnosis not present

## 2018-12-14 DIAGNOSIS — N183 Chronic kidney disease, stage 3 unspecified: Secondary | ICD-10-CM

## 2018-12-14 DIAGNOSIS — R350 Frequency of micturition: Secondary | ICD-10-CM | POA: Diagnosis not present

## 2018-12-14 DIAGNOSIS — I482 Chronic atrial fibrillation, unspecified: Secondary | ICD-10-CM

## 2018-12-14 LAB — URINALYSIS, COMPLETE (UACMP) WITH MICROSCOPIC
Bacteria, UA: NONE SEEN
Bilirubin Urine: NEGATIVE
Glucose, UA: NEGATIVE mg/dL
Hgb urine dipstick: NEGATIVE
Ketones, ur: NEGATIVE mg/dL
Leukocytes,Ua: NEGATIVE
Nitrite: NEGATIVE
Protein, ur: NEGATIVE mg/dL
Specific Gravity, Urine: 1.014 (ref 1.005–1.030)
pH: 5 (ref 5.0–8.0)

## 2018-12-14 NOTE — Assessment & Plan Note (Signed)
She has chronic kidney disease stage III.  This is likely related to cardiac issues Serum creatinine is stable. Continue medical management 

## 2018-12-14 NOTE — Progress Notes (Signed)
[place] OFFICE PROGRESS NOTE  Patient Care Team: Reubin Milan, MD as PCP - General (Internal Medicine) Carola Frost, RN as Registered Nurse (Medical Oncology) [name], MD as Attending Physician (Internal Medicine)  ASSESSMENT & PLAN:  Grade 3a follicular lymphoma of lymph nodes of multiple regions [facility]) I have reviewed CT scan with the patient and family She has complete response to treatment I recommend 3 more cycles of chemotherapy before stopping She agree with the plan of care  Dysuria She has significant urinary frequency and discomfort CT imaging show abnormalities in her bladder I recommend urinalysis and urine culture and she agreed with the plan of care  Chronic kidney disease, stage III (moderate) She has chronic kidney disease stage III.  This is likely related to cardiac issues Serum creatinine is stable. Continue medical management  Chronic atrial fibrillation [facility]) She has no clinical signs of congestive heart failure on exam She will continue chronic anticoagulation therapy.   Orders Placed This Encounter  Procedures  . Urine Culture    Standing Status:   Future    Standing Expiration Date:   01/18/2020  . Urinalysis, Complete w Microscopic    Standing Status:   Future    Standing Expiration Date:   12/15/2019    INTERVAL HISTORY: Please see below for problem oriented charting. She returns for further follow-up and review of test results Since last time I saw her, she complained of urinary frequency No hematuria.  Denies nausea No new lymphadenopathy  SUMMARY OF ONCOLOGIC HISTORY:   Grade 3a follicular lymphoma of lymph nodes of multiple regions ([place])   01/21/2015 Surgery    She underwent excisional lymph node biopsy that came back follicular lymphoma grade 3    01/21/2015 Pathology Results    Accession: [id number] biopsy confirmed follicular lymphoma    4/00/8676 Imaging    Echocardiogram showed ejection fraction of  55-60%    02/11/2015 Imaging     PET CT scan show possible splenic involvement and diffuse lymphadenopathy throughout    02/11/2015 Bone Marrow Biopsy     bone marrow biopsy was performed and is involved by lymphoma with translocation of igH/BCL2    02/13/2015 Procedure    She had port placement.    02/17/2015 - 06/02/2015 Chemotherapy    She received R-CHOP chemo x 6    02/17/2015 Adverse Reaction    She had mild infusion reaction with cycle 1 of treatment.    04/18/2015 Imaging    PET CT scan showed near complete response to treatment.    04/22/2015 Adverse Reaction    Vincristine dose was reduced by 50% due to neuropathy from cycle 4 onwards    06/25/2015 - 07/06/2015 Hospital Admission    She was hospitalized for recent sepsis/bacteremia and a fib with RVR    07/11/2015 Imaging    repeat PEt scan showed complete response to Rx    07/14/2015 - 06/16/2017 Chemotherapy    She received maintenance Rituximab every 60 days    12/15/2015 Imaging    PET CT showed no evidence of cancer recurrence    06/15/2016 PET scan    No evidence of active lymphoma on skullbase to thigh FDG PET scan. Small LEFT periaortic retroperitoneal lymph nodes without significant metabolic activity ( Deauville 1). No change from prior    03/24/2017 Imaging    1. Borderline enlarged abdominal retroperitoneal lymph nodes, stable. No new adenopathy in the chest, abdomen or pelvis. 2. Aortic atherosclerosis (ICD10-170.0). 3. Enlarged pulmonary arteries,  indicative of pulmonary arterial hypertension.    09/14/2017 PET scan    Stable exam. No evidence of active lymphoma or other acute findings. (Deauville score 1)    08/08/2018 PET scan    1. Evidence of progressive/recurrent disease, as evidenced by new hypermetabolic nodes and subcutaneous nodule/nodes throughout left chest and abdomen. (Deauville 5). 2. Likely physiologic right nasopharyngeal hypermetabolism. Recommend attention on follow-up. 3. Incidental findings,  including pulmonary artery enlargement, suggesting pulmonary arterial hypertension, stable left lower lobe pulmonary nodule, and aortic Atherosclerosis (ICD10-I70.0).    08/29/2018 Initial Biopsy    Soft tissue simple excision left back: Follicular lymphoma grade 1-2 positive for CD20, PAX5, bcl-6, and bcl-2.  Ki-67 30%    09/04/2018 -  Chemotherapy    The patient had Bendamustine and Rituximab    12/13/2018 Imaging    1. Response to therapy, as evidenced by resolution of left chest wall nodularity and decrease in size of small left axillary nodes. 2. No residual soft tissue thickening at the site of hypermetabolism along the posterior left eleventh rib. Resolution of adjacent subcutaneous nodularity. 3. No new or progressive disease identified. 4.  Aortic Atherosclerosis (ICD10-I70.0). 5. Bilateral pulmonary nodules, similar. 6. Trace air within the urinary bladder could be iatrogenic. Possible pericystic edema. Correlate with symptoms of cystitis and recent instrumentation.     REVIEW OF SYSTEMS:   Constitutional: Denies fevers, chills or abnormal weight loss Eyes: Denies blurriness of vision Ears, nose, mouth, throat, and face: Denies mucositis or sore throat Respiratory: Denies cough, dyspnea or wheezes Cardiovascular: Denies palpitation, chest discomfort or lower extremity swelling Gastrointestinal:  Denies nausea, heartburn or change in bowel habits Skin: Denies abnormal skin rashes Lymphatics: Denies new lymphadenopathy or easy bruising Neurological:Denies numbness, tingling or new weaknesses Behavioral/Psych: Mood is stable, no new changes  All other systems were reviewed with the patient and are negative.  I have reviewed the past medical history, past surgical history, social history and family history with the patient and they are unchanged from previous note.  ALLERGIES:  has No Known Allergies.  MEDICATIONS:  Current Outpatient Medications  Medication Sig Dispense  Refill  . acetaminophen (TYLENOL) 500 MG tablet Take 500 mg by mouth every 6 (six) hours as needed for moderate pain or headache.    Marland Kitchen acyclovir (ZOVIRAX) 400 MG tablet Take 1 tablet (400 mg total) by mouth daily. 30 tablet 3  . amoxicillin (AMOXIL) 500 MG capsule Take 2,000 mg by mouth See admin instructions. Take 2000 mg by mouth 1 hour prior to dental appointment    . apixaban (ELIQUIS) 5 MG TABS tablet Take 5 mg by mouth 2 (two) times daily.     . bisacodyl (DULCOLAX) 5 MG EC tablet Take 5 mg by mouth daily as needed for moderate constipation.    . diclofenac sodium (VOLTAREN) 1 % GEL Apply 2 g topically 3 (three) times daily as needed (knee pain). (Patient taking differently: Apply 2 g topically 3 (three) times daily as needed (for back or knee pain). ) 100 g 0  . levothyroxine (SYNTHROID, LEVOTHROID) 112 MCG tablet Take 112 mcg by mouth daily.   9  . lidocaine-prilocaine (EMLA) cream Apply to affected area once 30 g 3  . LORazepam (ATIVAN) 0.5 MG tablet Take 1 tablet (0.5 mg total) by mouth every 6 (six) hours as needed (Nausea or vomiting). 30 tablet 0  . omeprazole (PRILOSEC) 40 MG capsule Take 1 capsule (40 mg total) by mouth daily. 90 capsule 1  . ondansetron (ZOFRAN)  8 MG tablet Take 1 tablet (8 mg total) by mouth 2 (two) times daily as needed for refractory nausea / vomiting. Start on day 2 after bendamustine chemotherapy. 30 tablet 1  . prochlorperazine (COMPAZINE) 10 MG tablet Take 1 tablet (10 mg total) by mouth every 6 (six) hours as needed (Nausea or vomiting). 30 tablet 1   No current facility-administered medications for this visit.    Facility-Administered Medications Ordered in Other Visits  Medication Dose Route Frequency Provider Last Rate Last Dose  . ondansetron (ZOFRAN) 8 mg in sodium chloride 0.9 % 50 mL IVPB   Intravenous Once Mitcheal Sweetin, MD      . sodium chloride 0.9 % injection 10 mL  10 mL Intravenous PRN Alvy Bimler, Kenya Shiraishi, MD   10 mL at 04/05/17 1315    PHYSICAL  EXAMINATION: ECOG PERFORMANCE STATUS: 1 - Symptomatic but completely ambulatory  Vitals:   12/14/18 1330  BP: (!) 142/67  Pulse: 81  Resp: 18  Temp: (!) 97.5 F (36.4 C)  SpO2: 95%   Filed Weights   12/14/18 1330  Weight: 145 lb 6.4 oz (66 kg)    GENERAL:alert, no distress and comfortable SKIN: skin color, texture, turgor are normal, no rashes or significant lesions EYES: normal, Conjunctiva are pink and non-injected, sclera clear OROPHARYNX:no exudate, no erythema and lips, buccal mucosa, and tongue normal  NECK: supple, thyroid normal size, non-tender, without nodularity LYMPH:  no palpable lymphadenopathy in the cervical, axillary or inguinal LUNGS: clear to auscultation and percussion with normal breathing effort HEART: regular rate & rhythm and no murmurs and no lower extremity edema ABDOMEN:abdomen soft, non-tender and normal bowel sounds Musculoskeletal:no cyanosis of digits and no clubbing  NEURO: alert & oriented x 3 with fluent speech, no focal motor/sensory deficits  LABORATORY DATA:  I have reviewed the data as listed    Component Value Date/Time   NA 140 12/13/2018 1115   NA 142 08/29/2017 0838   K 4.4 12/13/2018 1115   K 4.2 08/29/2017 0838   CL 105 12/13/2018 1115   CO2 26 12/13/2018 1115   CO2 23 08/29/2017 0838   GLUCOSE 100 (H) 12/13/2018 1115   GLUCOSE 97 08/29/2017 0838   BUN 25 (H) 12/13/2018 1115   BUN 24.0 08/29/2017 0838   CREATININE 1.07 (H) 12/13/2018 1115   CREATININE 1.0 08/29/2017 0838   CALCIUM 9.1 12/13/2018 1115   CALCIUM 8.9 08/29/2017 0838   PROT 6.5 12/13/2018 1115   PROT 6.4 08/29/2017 0838   ALBUMIN 4.1 12/13/2018 1115   ALBUMIN 3.8 08/29/2017 0838   AST 20 12/13/2018 1115   AST 18 08/29/2017 0838   ALT 12 12/13/2018 1115   ALT 16 08/29/2017 0838   ALKPHOS 83 12/13/2018 1115   ALKPHOS 93 08/29/2017 0838   BILITOT 0.6 12/13/2018 1115   BILITOT 0.33 08/29/2017 0838   GFRNONAA 51 (L) 12/13/2018 1115   GFRAA 59 (L)  12/13/2018 1115    No results found for: SPEP, UPEP  Lab Results  Component Value Date   WBC 5.0 12/13/2018   NEUTROABS 4.0 12/13/2018   HGB 12.6 12/13/2018   HCT 38.3 12/13/2018   MCV 96.0 12/13/2018   PLT 197 12/13/2018      Chemistry      Component Value Date/Time   NA 140 12/13/2018 1115   NA 142 08/29/2017 0838   K 4.4 12/13/2018 1115   K 4.2 08/29/2017 0838   CL 105 12/13/2018 1115   CO2 26 12/13/2018 1115   CO2  23 08/29/2017 0838   BUN 25 (H) 12/13/2018 1115   BUN 24.0 08/29/2017 0838   CREATININE 1.07 (H) 12/13/2018 1115   CREATININE 1.0 08/29/2017 0838      Component Value Date/Time   CALCIUM 9.1 12/13/2018 1115   CALCIUM 8.9 08/29/2017 0838   ALKPHOS 83 12/13/2018 1115   ALKPHOS 93 08/29/2017 0838   AST 20 12/13/2018 1115   AST 18 08/29/2017 0838   ALT 12 12/13/2018 1115   ALT 16 08/29/2017 0838   BILITOT 0.6 12/13/2018 1115   BILITOT 0.33 08/29/2017 0838       RADIOGRAPHIC STUDIES: I have reviewed multiple imaging studies with the patient and family I have personally reviewed the radiological images as listed and agreed with the findings in the report. Ct Chest W Contrast  Result Date: 12/13/2018 CLINICAL DATA:  Non-Hodgkin's lymphoma diagnosed in 2017 and 2019. Chemotherapy in progress. Asymptomatic. Appendectomy. Hysterectomy. EXAM: CT CHEST, ABDOMEN, AND PELVIS WITH CONTRAST TECHNIQUE: Multidetector CT imaging of the chest, abdomen and pelvis was performed following the standard protocol during bolus administration of intravenous contrast. CONTRAST:  166m OMNIPAQUE IOHEXOL 300 MG/ML  SOLN COMPARISON:  08/08/2018 PET.  Most recent CTs of 03/24/2017. FINDINGS: CT CHEST FINDINGS Cardiovascular: Right Port-A-Cath tip high right atrium. Aortic atherosclerosis. Tortuous thoracic aorta. Mild to moderate cardiomegaly, without pericardial effusion. No central pulmonary embolism, on this non-dedicated study. Pulmonary artery enlargement, outflow tract 3.4 cm.  Mediastinum/Nodes: No supraclavicular adenopathy. Decrease in size of left axillary nodes, without residual adenopathy. Nodule/nodes superficial to the left scapula have resolved. No mediastinal or hilar adenopathy. Lungs/Pleura: No pleural fluid. Left lower lobe pulmonary nodule on image 83/7 4 mm is similar. There is nodule of 4 mm along the right minor fissure on image 66/7 which is also not significantly changed. Smaller nodules which are subpleural in distribution and favored to represent subpleural lymph nodes. The majority of these are present on the prior PET. Musculoskeletal: No acute osseous abnormality. CT ABDOMEN PELVIS FINDINGS Hepatobiliary: Segment 4 too small to characterize hypoattenuating lesion is similar on 03/24/2017 and likely a small cyst. Normal gallbladder, without biliary ductal dilatation. Pancreas: Normal, without mass or ductal dilatation. Spleen: A subcentimeter hypoattenuating splenic lesion is of no clinical significance. No splenomegaly. Adrenals/Urinary Tract: Normal adrenal glands. Bilateral renal scarring. No hydronephrosis. Trace air in the nondependent urinary bladder, including on image 99/2. Equivocal pericystic edema. Stomach/Bowel: Proximal gastric underdistention. Normal colon and terminal ileum. Normal small bowel. Vascular/Lymphatic: Aortic atherosclerosis. No abdominopelvic adenopathy. Reproductive: Hysterectomy.  No adnexal mass. Other: No significant free fluid. No evidence of omental or peritoneal disease. The area of subtle soft tissue thickening along the left eleventh posterior rib is no longer identified. The adjacent subcutaneous nodularity has resolved. Musculoskeletal: Right hip osteoarthritis. L3-5 trans pedicle screw fixation with trace L3-4 anterolisthesis. IMPRESSION: 1. Response to therapy, as evidenced by resolution of left chest wall nodularity and decrease in size of small left axillary nodes. 2. No residual soft tissue thickening at the site of  hypermetabolism along the posterior left eleventh rib. Resolution of adjacent subcutaneous nodularity. 3. No new or progressive disease identified. 4.  Aortic Atherosclerosis (ICD10-I70.0). 5. Bilateral pulmonary nodules, similar. 6. Trace air within the urinary bladder could be iatrogenic. Possible pericystic edema. Correlate with symptoms of cystitis and recent instrumentation. Electronically Signed   By: KAbigail MiyamotoM.D.   On: 12/13/2018 16:18   Ct Abdomen Pelvis W Contrast  Result Date: 12/13/2018 CLINICAL DATA:  Non-Hodgkin's lymphoma diagnosed in 2017 and 2019.  Chemotherapy in progress. Asymptomatic. Appendectomy. Hysterectomy. EXAM: CT CHEST, ABDOMEN, AND PELVIS WITH CONTRAST TECHNIQUE: Multidetector CT imaging of the chest, abdomen and pelvis was performed following the standard protocol during bolus administration of intravenous contrast. CONTRAST:  12m OMNIPAQUE IOHEXOL 300 MG/ML  SOLN COMPARISON:  08/08/2018 PET.  Most recent CTs of 03/24/2017. FINDINGS: CT CHEST FINDINGS Cardiovascular: Right Port-A-Cath tip high right atrium. Aortic atherosclerosis. Tortuous thoracic aorta. Mild to moderate cardiomegaly, without pericardial effusion. No central pulmonary embolism, on this non-dedicated study. Pulmonary artery enlargement, outflow tract 3.4 cm. Mediastinum/Nodes: No supraclavicular adenopathy. Decrease in size of left axillary nodes, without residual adenopathy. Nodule/nodes superficial to the left scapula have resolved. No mediastinal or hilar adenopathy. Lungs/Pleura: No pleural fluid. Left lower lobe pulmonary nodule on image 83/7 4 mm is similar. There is nodule of 4 mm along the right minor fissure on image 66/7 which is also not significantly changed. Smaller nodules which are subpleural in distribution and favored to represent subpleural lymph nodes. The majority of these are present on the prior PET. Musculoskeletal: No acute osseous abnormality. CT ABDOMEN PELVIS FINDINGS Hepatobiliary:  Segment 4 too small to characterize hypoattenuating lesion is similar on 03/24/2017 and likely a small cyst. Normal gallbladder, without biliary ductal dilatation. Pancreas: Normal, without mass or ductal dilatation. Spleen: A subcentimeter hypoattenuating splenic lesion is of no clinical significance. No splenomegaly. Adrenals/Urinary Tract: Normal adrenal glands. Bilateral renal scarring. No hydronephrosis. Trace air in the nondependent urinary bladder, including on image 99/2. Equivocal pericystic edema. Stomach/Bowel: Proximal gastric underdistention. Normal colon and terminal ileum. Normal small bowel. Vascular/Lymphatic: Aortic atherosclerosis. No abdominopelvic adenopathy. Reproductive: Hysterectomy.  No adnexal mass. Other: No significant free fluid. No evidence of omental or peritoneal disease. The area of subtle soft tissue thickening along the left eleventh posterior rib is no longer identified. The adjacent subcutaneous nodularity has resolved. Musculoskeletal: Right hip osteoarthritis. L3-5 trans pedicle screw fixation with trace L3-4 anterolisthesis. IMPRESSION: 1. Response to therapy, as evidenced by resolution of left chest wall nodularity and decrease in size of small left axillary nodes. 2. No residual soft tissue thickening at the site of hypermetabolism along the posterior left eleventh rib. Resolution of adjacent subcutaneous nodularity. 3. No new or progressive disease identified. 4.  Aortic Atherosclerosis (ICD10-I70.0). 5. Bilateral pulmonary nodules, similar. 6. Trace air within the urinary bladder could be iatrogenic. Possible pericystic edema. Correlate with symptoms of cystitis and recent instrumentation. Electronically Signed   By: KAbigail MiyamotoM.D.   On: 12/13/2018 16:18    All questions were answered. The patient knows to call the clinic with any problems, questions or concerns. No barriers to learning was detected.  I spent 25 minutes counseling the patient face to face. The  total time spent in the appointment was 30 minutes and more than 50% was on counseling and review of test results  Carrie Lark MD 12/14/2018 1:50 PM

## 2018-12-14 NOTE — Assessment & Plan Note (Signed)
She has significant urinary frequency and discomfort CT imaging show abnormalities in her bladder I recommend urinalysis and urine culture and she agreed with the plan of care

## 2018-12-14 NOTE — Assessment & Plan Note (Signed)
She has no clinical signs of congestive heart failure on exam She will continue chronic anticoagulation therapy. 

## 2018-12-14 NOTE — Assessment & Plan Note (Signed)
I have reviewed CT scan with the patient and family She has complete response to treatment I recommend 3 more cycles of chemotherapy before stopping She agree with the plan of care

## 2018-12-14 NOTE — Telephone Encounter (Signed)
Gave avs and calendar ° °

## 2018-12-15 LAB — URINE CULTURE: Culture: <10000 — AB

## 2018-12-18 ENCOUNTER — Telehealth: Payer: Self-pay

## 2018-12-18 NOTE — Telephone Encounter (Signed)
-----   Message from [name], MD sent at 12/18/2018  1:34 PM EST ----- Regarding: urine culture neg Call her and let her know

## 2018-12-18 NOTE — Telephone Encounter (Signed)
Called and given below message. She verbalized understanding. 

## 2018-12-27 ENCOUNTER — Inpatient Hospital Stay: Payer: No Typology Code available for payment source

## 2018-12-27 ENCOUNTER — Other Ambulatory Visit: Payer: Self-pay

## 2018-12-27 ENCOUNTER — Inpatient Hospital Stay: Payer: No Typology Code available for payment source | Attending: Hematology and Oncology

## 2018-12-27 VITALS — BP 129/81 | HR 86 | Temp 98.4°F | Resp 18 | Wt 134.0 lb

## 2018-12-27 DIAGNOSIS — Z7689 Persons encountering health services in other specified circumstances: Secondary | ICD-10-CM | POA: Insufficient documentation

## 2018-12-27 DIAGNOSIS — C8238 Follicular lymphoma grade IIIa, lymph nodes of multiple sites: Secondary | ICD-10-CM

## 2018-12-27 DIAGNOSIS — Z5112 Encounter for antineoplastic immunotherapy: Secondary | ICD-10-CM | POA: Diagnosis not present

## 2018-12-27 DIAGNOSIS — Z5111 Encounter for antineoplastic chemotherapy: Secondary | ICD-10-CM | POA: Diagnosis not present

## 2018-12-27 LAB — CBC WITH DIFFERENTIAL (CANCER CENTER ONLY)
Abs Immature Granulocytes: 0.01 10*3/uL (ref 0.00–0.07)
Basophils Absolute: 0 10*3/uL (ref 0.0–0.1)
Basophils Relative: 0 %
Eosinophils Absolute: 0.2 10*3/uL (ref 0.0–0.5)
Eosinophils Relative: 4 %
HCT: 37 % (ref 36.0–46.0)
Hemoglobin: 12 g/dL (ref 12.0–15.0)
Immature Granulocytes: 0 %
Lymphocytes Relative: 5 %
Lymphs Abs: 0.3 10*3/uL — ABNORMAL LOW (ref 0.7–4.0)
MCH: 31.7 pg (ref 26.0–34.0)
MCHC: 32.4 g/dL (ref 30.0–36.0)
MCV: 97.9 fL (ref 80.0–100.0)
Monocytes Absolute: 0.6 10*3/uL (ref 0.1–1.0)
Monocytes Relative: 11 %
Neutro Abs: 4 10*3/uL (ref 1.7–7.7)
Neutrophils Relative %: 80 %
Platelet Count: 169 10*3/uL (ref 150–400)
RBC: 3.78 MIL/uL — ABNORMAL LOW (ref 3.87–5.11)
RDW: 14.5 % (ref 11.5–15.5)
WBC Count: 5.1 10*3/uL (ref 4.0–10.5)
nRBC: 0 % (ref 0.0–0.2)

## 2018-12-27 LAB — CMP (CANCER CENTER ONLY)
ALT: 10 U/L (ref 0–44)
AST: 16 U/L (ref 15–41)
Albumin: 3.9 g/dL (ref 3.5–5.0)
Alkaline Phosphatase: 80 U/L (ref 38–126)
Anion gap: 12 (ref 5–15)
BUN: 30 mg/dL — ABNORMAL HIGH (ref 8–23)
CO2: 22 mmol/L (ref 22–32)
Calcium: 8.9 mg/dL (ref 8.9–10.3)
Chloride: 108 mmol/L (ref 98–111)
Creatinine: 1.08 mg/dL — ABNORMAL HIGH (ref 0.44–1.00)
GFR, Est AFR Am: 58 mL/min — ABNORMAL LOW (ref >60)
GFR, Estimated: 50 mL/min — ABNORMAL LOW (ref >60)
Glucose, Bld: 99 mg/dL (ref 70–99)
Potassium: 3.8 mmol/L (ref 3.5–5.1)
Sodium: 142 mmol/L (ref 135–145)
Total Bilirubin: 0.6 mg/dL (ref 0.3–1.2)
Total Protein: 6.2 g/dL — ABNORMAL LOW (ref 6.5–8.1)

## 2018-12-27 MED ORDER — DEXAMETHASONE SODIUM PHOSPHATE 10 MG/ML IJ SOLN
10.0000 mg | Freq: Once | INTRAMUSCULAR | Status: AC
  Administered 2018-12-27: 10 mg via INTRAVENOUS

## 2018-12-27 MED ORDER — PALONOSETRON HCL INJECTION 0.25 MG/5ML
INTRAVENOUS | Status: AC
  Filled 2018-12-27: qty 5

## 2018-12-27 MED ORDER — ACETAMINOPHEN 325 MG PO TABS
650.0000 mg | ORAL_TABLET | Freq: Once | ORAL | Status: AC
  Administered 2018-12-27: 650 mg via ORAL

## 2018-12-27 MED ORDER — SODIUM CHLORIDE 0.9 % IV SOLN
375.0000 mg/m2 | Freq: Once | INTRAVENOUS | Status: AC
  Administered 2018-12-27: 600 mg via INTRAVENOUS
  Filled 2018-12-27: qty 50

## 2018-12-27 MED ORDER — DIPHENHYDRAMINE HCL 25 MG PO CAPS
ORAL_CAPSULE | ORAL | Status: AC
  Filled 2018-12-27: qty 2

## 2018-12-27 MED ORDER — SODIUM CHLORIDE 0.9% FLUSH
10.0000 mL | INTRAVENOUS | Status: DC | PRN
  Administered 2018-12-27: 10 mL
  Filled 2018-12-27: qty 10

## 2018-12-27 MED ORDER — SODIUM CHLORIDE 0.9 % IV SOLN
Freq: Once | INTRAVENOUS | Status: AC
  Administered 2018-12-27: 10:00:00 via INTRAVENOUS
  Filled 2018-12-27: qty 250

## 2018-12-27 MED ORDER — DIPHENHYDRAMINE HCL 25 MG PO CAPS
50.0000 mg | ORAL_CAPSULE | Freq: Once | ORAL | Status: AC
  Administered 2018-12-27: 50 mg via ORAL

## 2018-12-27 MED ORDER — PALONOSETRON HCL INJECTION 0.25 MG/5ML
0.2500 mg | Freq: Once | INTRAVENOUS | Status: AC
  Administered 2018-12-27: 0.25 mg via INTRAVENOUS

## 2018-12-27 MED ORDER — HEPARIN SOD (PORK) LOCK FLUSH 100 UNIT/ML IV SOLN
500.0000 [IU] | Freq: Once | INTRAVENOUS | Status: AC | PRN
  Administered 2018-12-27: 500 [IU]
  Filled 2018-12-27: qty 5

## 2018-12-27 MED ORDER — SODIUM CHLORIDE 0.9% FLUSH
10.0000 mL | Freq: Once | INTRAVENOUS | Status: AC
  Administered 2018-12-27: 10 mL
  Filled 2018-12-27: qty 10

## 2018-12-27 MED ORDER — DEXAMETHASONE SODIUM PHOSPHATE 10 MG/ML IJ SOLN
INTRAMUSCULAR | Status: AC
  Filled 2018-12-27: qty 1

## 2018-12-27 MED ORDER — SODIUM CHLORIDE 0.9 % IV SOLN
90.0000 mg/m2 | Freq: Once | INTRAVENOUS | Status: AC
  Administered 2018-12-27: 150 mg via INTRAVENOUS
  Filled 2018-12-27: qty 6

## 2018-12-27 MED ORDER — ACETAMINOPHEN 325 MG PO TABS
ORAL_TABLET | ORAL | Status: AC
  Filled 2018-12-27: qty 2

## 2018-12-27 NOTE — Patient Instructions (Signed)
[facility] Discharge Instructions for Patients Receiving Chemotherapy  Today you received the following chemotherapy agents:  Rituxan and Bendeka.  To help prevent nausea and vomiting after your treatment, we encourage you to take your nausea medication as directed.   If you develop nausea and vomiting that is not controlled by your nausea medication, call the clinic.   BELOW ARE SYMPTOMS THAT SHOULD BE REPORTED IMMEDIATELY:  *FEVER GREATER THAN 100.5 F  *CHILLS WITH OR WITHOUT FEVER  NAUSEA AND VOMITING THAT IS NOT CONTROLLED WITH YOUR NAUSEA MEDICATION  *UNUSUAL SHORTNESS OF BREATH  *UNUSUAL BRUISING OR BLEEDING  TENDERNESS IN MOUTH AND THROAT WITH OR WITHOUT PRESENCE OF ULCERS  *URINARY PROBLEMS  *BOWEL PROBLEMS  UNUSUAL RASH Items with * indicate a potential emergency and should be followed up as soon as possible.  Feel free to call the clinic should you have any questions or concerns. The clinic phone number is [phone].  Please show the CHEMO ALERT CARD at check-in to the Emergency Department and triage nurse.   

## 2018-12-28 ENCOUNTER — Inpatient Hospital Stay: Payer: No Typology Code available for payment source

## 2018-12-28 VITALS — BP 130/72 | HR 80 | Temp 98.2°F | Resp 18

## 2018-12-28 DIAGNOSIS — C8238 Follicular lymphoma grade IIIa, lymph nodes of multiple sites: Secondary | ICD-10-CM

## 2018-12-28 DIAGNOSIS — Z5111 Encounter for antineoplastic chemotherapy: Secondary | ICD-10-CM | POA: Diagnosis not present

## 2018-12-28 MED ORDER — DEXAMETHASONE SODIUM PHOSPHATE 10 MG/ML IJ SOLN
INTRAMUSCULAR | Status: AC
  Filled 2018-12-28: qty 1

## 2018-12-28 MED ORDER — HEPARIN SOD (PORK) LOCK FLUSH 100 UNIT/ML IV SOLN
500.0000 [IU] | Freq: Once | INTRAVENOUS | Status: AC | PRN
  Administered 2018-12-28: 500 [IU]
  Filled 2018-12-28: qty 5

## 2018-12-28 MED ORDER — PEGFILGRASTIM 6 MG/0.6ML ~~LOC~~ PSKT
6.0000 mg | PREFILLED_SYRINGE | Freq: Once | SUBCUTANEOUS | Status: AC
  Administered 2018-12-28: 6 mg via SUBCUTANEOUS

## 2018-12-28 MED ORDER — SODIUM CHLORIDE 0.9% FLUSH
10.0000 mL | INTRAVENOUS | Status: DC | PRN
  Administered 2018-12-28: 10 mL
  Filled 2018-12-28: qty 10

## 2018-12-28 MED ORDER — SODIUM CHLORIDE 0.9 % IV SOLN
90.0000 mg/m2 | Freq: Once | INTRAVENOUS | Status: AC
  Administered 2018-12-28: 150 mg via INTRAVENOUS
  Filled 2018-12-28: qty 6

## 2018-12-28 MED ORDER — PEGFILGRASTIM 6 MG/0.6ML ~~LOC~~ PSKT
PREFILLED_SYRINGE | SUBCUTANEOUS | Status: AC
  Filled 2018-12-28: qty 0.6

## 2018-12-28 MED ORDER — SODIUM CHLORIDE 0.9 % IV SOLN
Freq: Once | INTRAVENOUS | Status: AC
  Administered 2018-12-28: 10:00:00 via INTRAVENOUS
  Filled 2018-12-28: qty 250

## 2018-12-28 MED ORDER — DEXAMETHASONE SODIUM PHOSPHATE 10 MG/ML IJ SOLN
10.0000 mg | Freq: Once | INTRAMUSCULAR | Status: AC
  Administered 2018-12-28: 10 mg via INTRAVENOUS

## 2018-12-28 NOTE — Patient Instructions (Signed)
[facility] Discharge Instructions for Patients Receiving Chemotherapy  Today you received the following chemotherapy agents Bendeka.  To help prevent nausea and vomiting after your treatment, we encourage you to take your nausea medication as directed.   If you develop nausea and vomiting that is not controlled by your nausea medication, call the clinic.   BELOW ARE SYMPTOMS THAT SHOULD BE REPORTED IMMEDIATELY:  *FEVER GREATER THAN 100.5 F  *CHILLS WITH OR WITHOUT FEVER  NAUSEA AND VOMITING THAT IS NOT CONTROLLED WITH YOUR NAUSEA MEDICATION  *UNUSUAL SHORTNESS OF BREATH  *UNUSUAL BRUISING OR BLEEDING  TENDERNESS IN MOUTH AND THROAT WITH OR WITHOUT PRESENCE OF ULCERS  *URINARY PROBLEMS  *BOWEL PROBLEMS  UNUSUAL RASH Items with * indicate a potential emergency and should be followed up as soon as possible.  Feel free to call the clinic should you have any questions or concerns. The clinic phone number is [phone].  Please show the CHEMO ALERT CARD at check-in to the Emergency Department and triage nurse.   

## 2019-01-24 ENCOUNTER — Inpatient Hospital Stay (HOSPITAL_BASED_OUTPATIENT_CLINIC_OR_DEPARTMENT_OTHER): Payer: No Typology Code available for payment source | Admitting: Hematology and Oncology

## 2019-01-24 ENCOUNTER — Other Ambulatory Visit: Payer: Self-pay

## 2019-01-24 ENCOUNTER — Inpatient Hospital Stay: Payer: No Typology Code available for payment source

## 2019-01-24 ENCOUNTER — Encounter: Payer: Self-pay | Admitting: Hematology and Oncology

## 2019-01-24 ENCOUNTER — Inpatient Hospital Stay: Payer: No Typology Code available for payment source | Attending: Hematology and Oncology

## 2019-01-24 VITALS — BP 130/82 | HR 87 | Temp 98.0°F | Resp 18

## 2019-01-24 DIAGNOSIS — Z7901 Long term (current) use of anticoagulants: Secondary | ICD-10-CM | POA: Insufficient documentation

## 2019-01-24 DIAGNOSIS — Z791 Long term (current) use of non-steroidal anti-inflammatories (NSAID): Secondary | ICD-10-CM | POA: Diagnosis not present

## 2019-01-24 DIAGNOSIS — I10 Essential (primary) hypertension: Secondary | ICD-10-CM

## 2019-01-24 DIAGNOSIS — Z9221 Personal history of antineoplastic chemotherapy: Secondary | ICD-10-CM | POA: Insufficient documentation

## 2019-01-24 DIAGNOSIS — Z5112 Encounter for antineoplastic immunotherapy: Secondary | ICD-10-CM | POA: Insufficient documentation

## 2019-01-24 DIAGNOSIS — N183 Chronic kidney disease, stage 3 unspecified: Secondary | ICD-10-CM

## 2019-01-24 DIAGNOSIS — Z5111 Encounter for antineoplastic chemotherapy: Secondary | ICD-10-CM | POA: Diagnosis present

## 2019-01-24 DIAGNOSIS — C8238 Follicular lymphoma grade IIIa, lymph nodes of multiple sites: Secondary | ICD-10-CM

## 2019-01-24 DIAGNOSIS — Z5189 Encounter for other specified aftercare: Secondary | ICD-10-CM | POA: Insufficient documentation

## 2019-01-24 DIAGNOSIS — Z79899 Other long term (current) drug therapy: Secondary | ICD-10-CM

## 2019-01-24 LAB — CBC WITH DIFFERENTIAL (CANCER CENTER ONLY)
Abs Immature Granulocytes: 0.01 10*3/uL (ref 0.00–0.07)
Basophils Absolute: 0 10*3/uL (ref 0.0–0.1)
Basophils Relative: 1 %
Eosinophils Absolute: 0.2 10*3/uL (ref 0.0–0.5)
Eosinophils Relative: 4 %
HCT: 38.7 % (ref 36.0–46.0)
Hemoglobin: 12.6 g/dL (ref 12.0–15.0)
Immature Granulocytes: 0 %
Lymphocytes Relative: 6 %
Lymphs Abs: 0.3 10*3/uL — ABNORMAL LOW (ref 0.7–4.0)
MCH: 32.4 pg (ref 26.0–34.0)
MCHC: 32.6 g/dL (ref 30.0–36.0)
MCV: 99.5 fL (ref 80.0–100.0)
Monocytes Absolute: 0.6 10*3/uL (ref 0.1–1.0)
Monocytes Relative: 13 %
Neutro Abs: 3.6 10*3/uL (ref 1.7–7.7)
Neutrophils Relative %: 76 %
Platelet Count: 193 10*3/uL (ref 150–400)
RBC: 3.89 MIL/uL (ref 3.87–5.11)
RDW: 13.2 % (ref 11.5–15.5)
WBC Count: 4.7 10*3/uL (ref 4.0–10.5)
nRBC: 0 % (ref 0.0–0.2)

## 2019-01-24 LAB — CMP (CANCER CENTER ONLY)
ALT: 12 U/L (ref 0–44)
AST: 16 U/L (ref 15–41)
Albumin: 3.9 g/dL (ref 3.5–5.0)
Alkaline Phosphatase: 82 U/L (ref 38–126)
Anion gap: 10 (ref 5–15)
BUN: 21 mg/dL (ref 8–23)
CO2: 25 mmol/L (ref 22–32)
Calcium: 9.2 mg/dL (ref 8.9–10.3)
Chloride: 107 mmol/L (ref 98–111)
Creatinine: 1.06 mg/dL — ABNORMAL HIGH (ref 0.44–1.00)
GFR, Est AFR Am: 59 mL/min — ABNORMAL LOW
GFR, Estimated: 51 mL/min — ABNORMAL LOW
Glucose, Bld: 68 mg/dL — ABNORMAL LOW (ref 70–99)
Potassium: 4.1 mmol/L (ref 3.5–5.1)
Sodium: 142 mmol/L (ref 135–145)
Total Bilirubin: 0.7 mg/dL (ref 0.3–1.2)
Total Protein: 6.4 g/dL — ABNORMAL LOW (ref 6.5–8.1)

## 2019-01-24 MED ORDER — SODIUM CHLORIDE 0.9 % IV SOLN
90.0000 mg/m2 | Freq: Once | INTRAVENOUS | Status: AC
  Administered 2019-01-24: 150 mg via INTRAVENOUS
  Filled 2019-01-24: qty 6

## 2019-01-24 MED ORDER — ACETAMINOPHEN 325 MG PO TABS
ORAL_TABLET | ORAL | Status: AC
  Filled 2019-01-24: qty 2

## 2019-01-24 MED ORDER — DIPHENHYDRAMINE HCL 25 MG PO CAPS
50.0000 mg | ORAL_CAPSULE | Freq: Once | ORAL | Status: AC
  Administered 2019-01-24: 50 mg via ORAL

## 2019-01-24 MED ORDER — SODIUM CHLORIDE 0.9% FLUSH
10.0000 mL | Freq: Once | INTRAVENOUS | Status: AC
  Administered 2019-01-24: 10 mL
  Filled 2019-01-24: qty 10

## 2019-01-24 MED ORDER — SODIUM CHLORIDE 0.9% FLUSH
10.0000 mL | INTRAVENOUS | Status: DC | PRN
  Administered 2019-01-24: 10 mL
  Filled 2019-01-24: qty 10

## 2019-01-24 MED ORDER — HEPARIN SOD (PORK) LOCK FLUSH 100 UNIT/ML IV SOLN
500.0000 [IU] | Freq: Once | INTRAVENOUS | Status: AC | PRN
  Administered 2019-01-24: 500 [IU]
  Filled 2019-01-24: qty 5

## 2019-01-24 MED ORDER — ACETAMINOPHEN 325 MG PO TABS
650.0000 mg | ORAL_TABLET | Freq: Once | ORAL | Status: AC
  Administered 2019-01-24: 650 mg via ORAL

## 2019-01-24 MED ORDER — DEXAMETHASONE SODIUM PHOSPHATE 10 MG/ML IJ SOLN
INTRAMUSCULAR | Status: AC
  Filled 2019-01-24: qty 1

## 2019-01-24 MED ORDER — PALONOSETRON HCL INJECTION 0.25 MG/5ML
0.2500 mg | Freq: Once | INTRAVENOUS | Status: AC
  Administered 2019-01-24: 0.25 mg via INTRAVENOUS

## 2019-01-24 MED ORDER — SODIUM CHLORIDE 0.9 % IV SOLN
375.0000 mg/m2 | Freq: Once | INTRAVENOUS | Status: AC
  Administered 2019-01-24: 600 mg via INTRAVENOUS
  Filled 2019-01-24: qty 10

## 2019-01-24 MED ORDER — SODIUM CHLORIDE 0.9 % IV SOLN
Freq: Once | INTRAVENOUS | Status: AC
  Administered 2019-01-24: 10:00:00 via INTRAVENOUS
  Filled 2019-01-24: qty 250

## 2019-01-24 MED ORDER — DIPHENHYDRAMINE HCL 25 MG PO CAPS
ORAL_CAPSULE | ORAL | Status: AC
  Filled 2019-01-24: qty 2

## 2019-01-24 MED ORDER — PALONOSETRON HCL INJECTION 0.25 MG/5ML
INTRAVENOUS | Status: AC
  Filled 2019-01-24: qty 5

## 2019-01-24 MED ORDER — DEXAMETHASONE SODIUM PHOSPHATE 10 MG/ML IJ SOLN
10.0000 mg | Freq: Once | INTRAMUSCULAR | Status: AC
  Administered 2019-01-24: 10 mg via INTRAVENOUS

## 2019-01-24 NOTE — Assessment & Plan Note (Signed)
Her recent CT scan showed complete response to treatment I recommend we proceed with chemotherapy today and tomorrow and then to stop treatment She agree with the plan of care I plan to repeat imaging study again in August 2020

## 2019-01-24 NOTE — Assessment & Plan Note (Signed)
She has chronic kidney disease stage III.  This is likely related to cardiac issues Serum creatinine is stable. Continue medical management

## 2019-01-24 NOTE — Patient Instructions (Signed)
[facility] Discharge Instructions for Patients Receiving Chemotherapy  Today you received the following chemotherapy agents:  Rituxan and Bendeka.  To help prevent nausea and vomiting after your treatment, we encourage you to take your nausea medication as directed.   If you develop nausea and vomiting that is not controlled by your nausea medication, call the clinic.   BELOW ARE SYMPTOMS THAT SHOULD BE REPORTED IMMEDIATELY:  *FEVER GREATER THAN 100.5 F  *CHILLS WITH OR WITHOUT FEVER  NAUSEA AND VOMITING THAT IS NOT CONTROLLED WITH YOUR NAUSEA MEDICATION  *UNUSUAL SHORTNESS OF BREATH  *UNUSUAL BRUISING OR BLEEDING  TENDERNESS IN MOUTH AND THROAT WITH OR WITHOUT PRESENCE OF ULCERS  *URINARY PROBLEMS  *BOWEL PROBLEMS  UNUSUAL RASH Items with * indicate a potential emergency and should be followed up as soon as possible.  Feel free to call the clinic should you have any questions or concerns. The clinic phone number is [phone].  Please show the CHEMO ALERT CARD at check-in to the Emergency Department and triage nurse.   

## 2019-01-24 NOTE — Progress Notes (Signed)
[name] OFF[facility] PROGRESS NOTE  Patient Care Team: Reubin Milan, MD as PCP - General (Internal Medicine) Carola Frost, RN as Registered Nurse (Medical Oncology) [name], MD as Attending Physician (Internal Medicine)  ASSESSMENT & PLAN:  Grade 3a follicular lymphoma of lymph nodes of multiple regions [facility]) Her recent CT scan showed complete response to treatment I recommend we proceed with chemotherapy today and tomorrow and then to stop treatment She agree with the plan of care I plan to repeat imaging study again in August 2020  Chronic kidney disease, stage III (moderate) She has chronic kidney disease stage III.  This is likely related to cardiac issues Serum creatinine is stable. Continue medical management  Essential hypertension Her blood pressure is mildly elevated but could be due to anxiety I reassured the patient and recommend her to continue her medications as directed   No orders of the defined types were placed in this encounter.   INTERVAL HISTORY: Please see below for problem oriented charting. She returns for cycle 5 of chemotherapy She complains of recent fatigue No recent infection, fever or chills No new lymphadenopathy  SUMMARY OF ONCOLOGIC HISTORY:   Grade 3a follicular lymphoma of lymph nodes of multiple regions ([place])   01/21/2015 Surgery    She underwent excisional lymph node biopsy that came back follicular lymphoma grade 3    01/21/2015 Pathology Results    Accession: [id number] biopsy confirmed follicular lymphoma    7/42/5956 Imaging    Echocardiogram showed ejection fraction of 55-60%    02/11/2015 Imaging     PET CT scan show possible splenic involvement and diffuse lymphadenopathy throughout    02/11/2015 Bone Marrow Biopsy     bone marrow biopsy was performed and is involved by lymphoma with translocation of igH/BCL2    02/13/2015 Procedure    She had port placement.    02/17/2015 - 06/02/2015 Chemotherapy    She received R-CHOP chemo x 6    02/17/2015 Adverse Reaction    She had mild infusion reaction with cycle 1 of treatment.    04/18/2015 Imaging    PET CT scan showed near complete response to treatment.    04/22/2015 Adverse Reaction    Vincristine dose was reduced by 50% due to neuropathy from cycle 4 onwards    06/25/2015 - 07/06/2015 Hospital Admission    She was hospitalized for recent sepsis/bacteremia and a fib with RVR    07/11/2015 Imaging    repeat PEt scan showed complete response to Rx    07/14/2015 - 06/16/2017 Chemotherapy    She received maintenance Rituximab every 60 days    12/15/2015 Imaging    PET CT showed no evidence of cancer recurrence    06/15/2016 PET scan    No evidence of active lymphoma on skullbase to thigh FDG PET scan. Small LEFT periaortic retroperitoneal lymph nodes without significant metabolic activity ( Deauville 1). No change from prior    03/24/2017 Imaging    1. Borderline enlarged abdominal retroperitoneal lymph nodes, stable. No new adenopathy in the chest, abdomen or pelvis. 2. Aortic atherosclerosis (ICD10-170.0). 3. Enlarged pulmonary arteries, indicative of pulmonary arterial hypertension.    09/14/2017 PET scan    Stable exam. No evidence of active lymphoma or other acute findings. (Deauville score 1)    08/08/2018 PET scan    1. Evidence of progressive/recurrent disease, as evidenced by new hypermetabolic nodes and subcutaneous nodule/nodes throughout left chest and abdomen. (Deauville 5). 2. Likely physiologic right nasopharyngeal hypermetabolism. Recommend  attention on follow-up. 3. Incidental findings, including pulmonary artery enlargement, suggesting pulmonary arterial hypertension, stable left lower lobe pulmonary nodule, and aortic Atherosclerosis (ICD10-I70.0).    08/29/2018 Initial Biopsy    Soft tissue simple excision left back: Follicular lymphoma grade 1-2 positive for CD20, PAX5, bcl-6, and bcl-2.  Ki-67 30%    09/04/2018 -   Chemotherapy    The patient had Bendamustine and Rituximab    12/13/2018 Imaging    1. Response to therapy, as evidenced by resolution of left chest wall nodularity and decrease in size of small left axillary nodes. 2. No residual soft tissue thickening at the site of hypermetabolism along the posterior left eleventh rib. Resolution of adjacent subcutaneous nodularity. 3. No new or progressive disease identified. 4.  Aortic Atherosclerosis (ICD10-I70.0). 5. Bilateral pulmonary nodules, similar. 6. Trace air within the urinary bladder could be iatrogenic. Possible pericystic edema. Correlate with symptoms of cystitis and recent instrumentation.     REVIEW OF SYSTEMS:   Constitutional: Denies fevers, chills or abnormal weight loss Eyes: Denies blurriness of vision Ears, nose, mouth, throat, and face: Denies mucositis or sore throat Respiratory: Denies cough, dyspnea or wheezes Cardiovascular: Denies palpitation, chest discomfort or lower extremity swelling Gastrointestinal:  Denies nausea, heartburn or change in bowel habits Skin: Denies abnormal skin rashes Lymphatics: Denies new lymphadenopathy or easy bruising Neurological:Denies numbness, tingling or new weaknesses Behavioral/Psych: Mood is stable, no new changes  All other systems were reviewed with the patient and are negative.  I have reviewed the past medical history, past surgical history, social history and family history with the patient and they are unchanged from previous note.  ALLERGIES:  has No Known Allergies.  MEDICATIONS:  Current Outpatient Medications  Medication Sig Dispense Refill  . acetaminophen (TYLENOL) 500 MG tablet Take 500 mg by mouth every 6 (six) hours as needed for moderate pain or headache.    Marland Kitchen acyclovir (ZOVIRAX) 400 MG tablet Take 1 tablet (400 mg total) by mouth daily. 30 tablet 3  . amoxicillin (AMOXIL) 500 MG capsule Take 2,000 mg by mouth See admin instructions. Take 2000 mg by mouth 1 hour  prior to dental appointment    . apixaban (ELIQUIS) 5 MG TABS tablet Take 5 mg by mouth 2 (two) times daily.     . bisacodyl (DULCOLAX) 5 MG EC tablet Take 5 mg by mouth daily as needed for moderate constipation.    . diclofenac sodium (VOLTAREN) 1 % GEL Apply 2 g topically 3 (three) times daily as needed (knee pain). (Patient taking differently: Apply 2 g topically 3 (three) times daily as needed (for back or knee pain). ) 100 g 0  . levothyroxine (SYNTHROID, LEVOTHROID) 112 MCG tablet Take 112 mcg by mouth daily.   9  . lidocaine-prilocaine (EMLA) cream Apply to affected area once 30 g 3  . LORazepam (ATIVAN) 0.5 MG tablet Take 1 tablet (0.5 mg total) by mouth every 6 (six) hours as needed (Nausea or vomiting). 30 tablet 0  . omeprazole (PRILOSEC) 40 MG capsule Take 1 capsule (40 mg total) by mouth daily. 90 capsule 1  . ondansetron (ZOFRAN) 8 MG tablet Take 1 tablet (8 mg total) by mouth 2 (two) times daily as needed for refractory nausea / vomiting. Start on day 2 after bendamustine chemotherapy. 30 tablet 1  . prochlorperazine (COMPAZINE) 10 MG tablet Take 1 tablet (10 mg total) by mouth every 6 (six) hours as needed (Nausea or vomiting). 30 tablet 1   No current facility-administered medications  for this visit.    Facility-Administered Medications Ordered in Other Visits  Medication Dose Route Frequency Provider Last Rate Last Dose  . ondansetron (ZOFRAN) 8 mg in sodium chloride 0.9 % 50 mL IVPB   Intravenous Once Creedon Danielski, MD      . sodium chloride 0.9 % injection 10 mL  10 mL Intravenous PRN Alvy Bimler, Hildred Pharo, MD   10 mL at 04/05/17 1315    PHYSICAL EXAMINATION: ECOG PERFORMANCE STATUS: 1 - Symptomatic but completely ambulatory  Vitals:   01/24/19 0914  BP: (!) 159/84  Pulse: 82  Resp: 17  Temp: 97.8 F (36.6 C)  SpO2: 99%   Filed Weights   01/24/19 0914  Weight: 143 lb 12.8 oz (65.2 kg)    GENERAL:alert, no distress and comfortable SKIN: skin color, texture, turgor are  normal, no rashes or significant lesions EYES: normal, Conjunctiva are pink and non-injected, sclera clear OROPHARYNX:no exudate, no erythema and lips, buccal mucosa, and tongue normal  NECK: supple, thyroid normal size, non-tender, without nodularity LYMPH:  no palpable lymphadenopathy in the cervical, axillary or inguinal LUNGS: clear to auscultation and percussion with normal breathing effort HEART: regular rate & rhythm and no murmurs and no lower extremity edema ABDOMEN:abdomen soft, non-tender and normal bowel sounds Musculoskeletal:no cyanosis of digits and no clubbing  NEURO: alert & oriented x 3 with fluent speech, no focal motor/sensory deficits  LABORATORY DATA:  I have reviewed the data as listed    Component Value Date/Time   NA 142 12/27/2018 0839   NA 142 08/29/2017 0838   K 3.8 12/27/2018 0839   K 4.2 08/29/2017 0838   CL 108 12/27/2018 0839   CO2 22 12/27/2018 0839   CO2 23 08/29/2017 0838   GLUCOSE 99 12/27/2018 0839   GLUCOSE 97 08/29/2017 0838   BUN 30 (H) 12/27/2018 0839   BUN 24.0 08/29/2017 0838   CREATININE 1.08 (H) 12/27/2018 0839   CREATININE 1.0 08/29/2017 0838   CALCIUM 8.9 12/27/2018 0839   CALCIUM 8.9 08/29/2017 0838   PROT 6.2 (L) 12/27/2018 0839   PROT 6.4 08/29/2017 0838   ALBUMIN 3.9 12/27/2018 0839   ALBUMIN 3.8 08/29/2017 0838   AST 16 12/27/2018 0839   AST 18 08/29/2017 0838   ALT 10 12/27/2018 0839   ALT 16 08/29/2017 0838   ALKPHOS 80 12/27/2018 0839   ALKPHOS 93 08/29/2017 0838   BILITOT 0.6 12/27/2018 0839   BILITOT 0.33 08/29/2017 0838   GFRNONAA 50 (L) 12/27/2018 0839   GFRAA 58 (L) 12/27/2018 0839    No results found for: SPEP, UPEP  Lab Results  Component Value Date   WBC 4.7 01/24/2019   NEUTROABS 3.6 01/24/2019   HGB 12.6 01/24/2019   HCT 38.7 01/24/2019   MCV 99.5 01/24/2019   PLT 193 01/24/2019      Chemistry      Component Value Date/Time   NA 142 12/27/2018 0839   NA 142 08/29/2017 0838   K 3.8  12/27/2018 0839   K 4.2 08/29/2017 0838   CL 108 12/27/2018 0839   CO2 22 12/27/2018 0839   CO2 23 08/29/2017 0838   BUN 30 (H) 12/27/2018 0839   BUN 24.0 08/29/2017 0838   CREATININE 1.08 (H) 12/27/2018 0839   CREATININE 1.0 08/29/2017 0838      Component Value Date/Time   CALCIUM 8.9 12/27/2018 0839   CALCIUM 8.9 08/29/2017 0838   ALKPHOS 80 12/27/2018 0839   ALKPHOS 93 08/29/2017 0838   AST 16 12/27/2018  0839   AST 18 08/29/2017 0838   ALT 10 12/27/2018 0839   ALT 16 08/29/2017 0838   BILITOT 0.6 12/27/2018 0839   BILITOT 0.33 08/29/2017 0838      All questions were answered. The patient knows to call the clinic with any problems, questions or concerns. No barriers to learning was detected.  I spent 15 minutes counseling the patient face to face. The total time spent in the appointment was 20 minutes and more than 50% was on counseling and review of test results  [name], MD 01/24/2019 9:24 AM

## 2019-01-24 NOTE — Assessment & Plan Note (Signed)
Her blood pressure is mildly elevated but could be due to anxiety I reassured the patient and recommend her to continue her medications as directed

## 2019-01-25 ENCOUNTER — Inpatient Hospital Stay: Payer: No Typology Code available for payment source

## 2019-01-25 ENCOUNTER — Other Ambulatory Visit: Payer: Self-pay

## 2019-01-25 ENCOUNTER — Telehealth: Payer: Self-pay | Admitting: Hematology and Oncology

## 2019-01-25 VITALS — BP 143/96 | HR 85 | Temp 98.2°F | Resp 20

## 2019-01-25 DIAGNOSIS — C8238 Follicular lymphoma grade IIIa, lymph nodes of multiple sites: Secondary | ICD-10-CM

## 2019-01-25 DIAGNOSIS — Z5112 Encounter for antineoplastic immunotherapy: Secondary | ICD-10-CM | POA: Diagnosis not present

## 2019-01-25 MED ORDER — SODIUM CHLORIDE 0.9% FLUSH
10.0000 mL | INTRAVENOUS | Status: DC | PRN
  Administered 2019-01-25: 10 mL
  Filled 2019-01-25: qty 10

## 2019-01-25 MED ORDER — DEXAMETHASONE SODIUM PHOSPHATE 10 MG/ML IJ SOLN
INTRAMUSCULAR | Status: AC
  Filled 2019-01-25: qty 1

## 2019-01-25 MED ORDER — PEGFILGRASTIM 6 MG/0.6ML ~~LOC~~ PSKT
PREFILLED_SYRINGE | SUBCUTANEOUS | Status: AC
  Filled 2019-01-25: qty 0.6

## 2019-01-25 MED ORDER — PEGFILGRASTIM 6 MG/0.6ML ~~LOC~~ PSKT
6.0000 mg | PREFILLED_SYRINGE | Freq: Once | SUBCUTANEOUS | Status: AC
  Administered 2019-01-25: 6 mg via SUBCUTANEOUS

## 2019-01-25 MED ORDER — SODIUM CHLORIDE 0.9 % IV SOLN
90.0000 mg/m2 | Freq: Once | INTRAVENOUS | Status: AC
  Administered 2019-01-25: 150 mg via INTRAVENOUS
  Filled 2019-01-25: qty 6

## 2019-01-25 MED ORDER — DEXAMETHASONE SODIUM PHOSPHATE 10 MG/ML IJ SOLN
10.0000 mg | Freq: Once | INTRAMUSCULAR | Status: AC
  Administered 2019-01-25: 10 mg via INTRAVENOUS

## 2019-01-25 MED ORDER — SODIUM CHLORIDE 0.9 % IV SOLN
Freq: Once | INTRAVENOUS | Status: AC
  Administered 2019-01-25: 10:00:00 via INTRAVENOUS
  Filled 2019-01-25: qty 250

## 2019-01-25 MED ORDER — HEPARIN SOD (PORK) LOCK FLUSH 100 UNIT/ML IV SOLN
500.0000 [IU] | Freq: Once | INTRAVENOUS | Status: AC | PRN
  Administered 2019-01-25: 500 [IU]
  Filled 2019-01-25: qty 5

## 2019-01-25 NOTE — Patient Instructions (Signed)
Coronavirus (COVID-19) Are you at risk?  Are you at risk for the Coronavirus (COVID-19)?  To be considered HIGH RISK for Coronavirus (COVID-19), you have to meet the following criteria:  . Traveled to [place], [place], [place], [place] or [place]; or in the [place] to [place], [place], [place], or [place]; and have fever, cough, and shortness of breath within the last 2 weeks of travel OR . Been in close contact with a person diagnosed with COVID-19 within the last 2 weeks and have fever, cough, and shortness of breath . IF YOU DO NOT MEET THESE CRITERIA, YOU ARE CONSIDERED LOW RISK FOR COVID-19.  What to do if you are HIGH RISK for COVID-19?  . If you are having a medical emergency, call 911. . Seek medical care right away. Before you go to a doctor's office, urgent care or emergency department, call ahead and tell them about your recent travel, contact with someone diagnosed with COVID-19, and your symptoms. You should receive instructions from your physician's office regarding next steps of care.  . When you arrive at healthcare provider, tell the healthcare staff immediately you have returned from visiting [place], [place], [place], [place] or [place]; or traveled in the [place] to [place], [place], [place], or [place]; in the last two weeks or you have been in close contact with a person diagnosed with COVID-19 in the last 2 weeks.   . Tell the health care staff about your symptoms: fever, cough and shortness of breath. . After you have been seen by a medical provider, you will be either: o Tested for (COVID-19) and discharged home on quarantine except to seek medical care if symptoms worsen, and asked to  - Stay home and avoid contact with others until you get your results (4-5 days)  - Avoid travel on public transportation if possible (such as bus, train, or airplane) or o Sent to the Emergency Department by EMS for evaluation, COVID-19 testing, and possible  admission depending on your condition and test results.  What to do if you are LOW RISK for COVID-19?  Reduce your risk of any infection by using the same precautions used for avoiding the common cold or flu:  . Wash your hands often with soap and warm water for at least 20 seconds.  If soap and water are not readily available, use an alcohol-based hand sanitizer with at least 60% alcohol.  . If coughing or sneezing, cover your mouth and nose by coughing or sneezing into the elbow areas of your shirt or coat, into a tissue or into your sleeve (not your hands). . Avoid shaking hands with others and consider head nods or verbal greetings only. . Avoid touching your eyes, nose, or mouth with unwashed hands.  . Avoid close contact with people who are sick. . Avoid places or events with large numbers of people in one location, like concerts or sporting events. . Carefully consider travel plans you have or are making. . If you are planning any travel outside or inside the [place], visit the CDC's Travelers' Health webpage for the latest health notices. . If you have some symptoms but not all symptoms, continue to monitor at home and seek medical attention if your symptoms worsen. . If you are having a medical emergency, call 911.   ADDITIONAL HEALTHCARE OPTIONS FOR PATIENTS  [facility] / e-Visit: [email or web address]         [facility]: [phone]  Onset   Urgent Care: [facility]: [facility] Discharge Instructions for Patients Receiving Chemotherapy  Today you received the following chemotherapy agents Bendeka   To help prevent nausea and vomiting after your treatment, we encourage you to take your nausea medication as directed.    If you develop nausea and vomiting that is not controlled by your nausea medication, call the clinic.   BELOW ARE  SYMPTOMS THAT SHOULD BE REPORTED IMMEDIATELY:  *FEVER GREATER THAN 100.5 F  *CHILLS WITH OR WITHOUT FEVER  NAUSEA AND VOMITING THAT IS NOT CONTROLLED WITH YOUR NAUSEA MEDICATION  *UNUSUAL SHORTNESS OF BREATH  *UNUSUAL BRUISING OR BLEEDING  TENDERNESS IN MOUTH AND THROAT WITH OR WITHOUT PRESENCE OF ULCERS  *URINARY PROBLEMS  *BOWEL PROBLEMS  UNUSUAL RASH Items with * indicate a potential emergency and should be followed up as soon as possible.  Feel free to call the clinic should you have any questions or concerns. The clinic phone number is [phone].  Please show the [facility] at check-in to the Emergency Department and triage nurse.

## 2019-01-25 NOTE — Telephone Encounter (Signed)
Called regarding 6/5  °

## 2019-01-30 ENCOUNTER — Encounter (HOSPITAL_COMMUNITY): Payer: Self-pay | Admitting: *Deleted

## 2019-01-30 ENCOUNTER — Emergency Department (HOSPITAL_COMMUNITY): Payer: Medicare HMO

## 2019-01-30 ENCOUNTER — Other Ambulatory Visit: Payer: Self-pay

## 2019-01-30 ENCOUNTER — Emergency Department (HOSPITAL_COMMUNITY)
Admission: EM | Admit: 2019-01-30 | Discharge: 2019-01-30 | Disposition: A | Discharge status: Home / Self Care | Payer: Medicare HMO | Attending: Emergency Medicine | Admitting: Emergency Medicine

## 2019-01-30 DIAGNOSIS — C8590 Non-Hodgkin lymphoma, unspecified, unspecified site: Secondary | ICD-10-CM | POA: Insufficient documentation

## 2019-01-30 DIAGNOSIS — R0602 Shortness of breath: Secondary | ICD-10-CM | POA: Diagnosis not present

## 2019-01-30 DIAGNOSIS — N183 Chronic kidney disease, stage 3 (moderate): Secondary | ICD-10-CM | POA: Insufficient documentation

## 2019-01-30 DIAGNOSIS — R1031 Right lower quadrant pain: Secondary | ICD-10-CM | POA: Diagnosis present

## 2019-01-30 DIAGNOSIS — Z9221 Personal history of antineoplastic chemotherapy: Secondary | ICD-10-CM | POA: Insufficient documentation

## 2019-01-30 DIAGNOSIS — I129 Hypertensive chronic kidney disease with stage 1 through stage 4 chronic kidney disease, or unspecified chronic kidney disease: Secondary | ICD-10-CM | POA: Diagnosis not present

## 2019-01-30 DIAGNOSIS — Z79899 Other long term (current) drug therapy: Secondary | ICD-10-CM | POA: Insufficient documentation

## 2019-01-30 DIAGNOSIS — R110 Nausea: Secondary | ICD-10-CM | POA: Diagnosis not present

## 2019-01-30 DIAGNOSIS — E039 Hypothyroidism, unspecified: Secondary | ICD-10-CM | POA: Insufficient documentation

## 2019-01-30 DIAGNOSIS — N390 Urinary tract infection, site not specified: Secondary | ICD-10-CM | POA: Insufficient documentation

## 2019-01-30 DIAGNOSIS — R1030 Lower abdominal pain, unspecified: Secondary | ICD-10-CM | POA: Diagnosis not present

## 2019-01-30 DIAGNOSIS — Z7901 Long term (current) use of anticoagulants: Secondary | ICD-10-CM | POA: Diagnosis not present

## 2019-01-30 LAB — URINALYSIS, ROUTINE W REFLEX MICROSCOPIC
Bilirubin Urine: NEGATIVE
Glucose, UA: NEGATIVE mg/dL
Ketones, ur: NEGATIVE mg/dL
Nitrite: NEGATIVE
Protein, ur: NEGATIVE mg/dL
Specific Gravity, Urine: 1.019 (ref 1.005–1.030)
pH: 5 (ref 5.0–8.0)

## 2019-01-30 LAB — COMPREHENSIVE METABOLIC PANEL
ALT: 13 U/L (ref 0–44)
AST: 15 U/L (ref 15–41)
Albumin: 4.2 g/dL (ref 3.5–5.0)
Alkaline Phosphatase: 143 U/L — ABNORMAL HIGH (ref 38–126)
Anion gap: 10 (ref 5–15)
BUN: 25 mg/dL — ABNORMAL HIGH (ref 8–23)
CO2: 24 mmol/L (ref 22–32)
Calcium: 9.2 mg/dL (ref 8.9–10.3)
Chloride: 104 mmol/L (ref 98–111)
Creatinine, Ser: 1.14 mg/dL — ABNORMAL HIGH (ref 0.44–1.00)
GFR calc Af Amer: 54 mL/min — ABNORMAL LOW (ref >60)
GFR calc non Af Amer: 47 mL/min — ABNORMAL LOW (ref >60)
Glucose, Bld: 123 mg/dL — ABNORMAL HIGH (ref 70–99)
Potassium: 3.6 mmol/L (ref 3.5–5.1)
Sodium: 138 mmol/L (ref 135–145)
Total Bilirubin: 0.7 mg/dL (ref 0.3–1.2)
Total Protein: 6.5 g/dL (ref 6.5–8.1)

## 2019-01-30 LAB — CBC WITH DIFFERENTIAL/PLATELET
Abs Immature Granulocytes: 0.33 10*3/uL — ABNORMAL HIGH (ref 0.00–0.07)
Basophils Absolute: 0 10*3/uL (ref 0.0–0.1)
Basophils Relative: 0 %
Eosinophils Absolute: 0.3 10*3/uL (ref 0.0–0.5)
Eosinophils Relative: 1 %
HCT: 40.9 % (ref 36.0–46.0)
Hemoglobin: 13.2 g/dL (ref 12.0–15.0)
Immature Granulocytes: 2 %
Lymphocytes Relative: 1 %
Lymphs Abs: 0.2 10*3/uL — ABNORMAL LOW (ref 0.7–4.0)
MCH: 32.5 pg (ref 26.0–34.0)
MCHC: 32.3 g/dL (ref 30.0–36.0)
MCV: 100.7 fL — ABNORMAL HIGH (ref 80.0–100.0)
Monocytes Absolute: 2.1 10*3/uL — ABNORMAL HIGH (ref 0.1–1.0)
Monocytes Relative: 9 %
Neutro Abs: 19.4 10*3/uL — ABNORMAL HIGH (ref 1.7–7.7)
Neutrophils Relative %: 87 %
Platelets: 173 10*3/uL (ref 150–400)
RBC: 4.06 MIL/uL (ref 3.87–5.11)
RDW: 13.5 % (ref 11.5–15.5)
WBC: 22.4 10*3/uL — ABNORMAL HIGH (ref 4.0–10.5)
nRBC: 0 % (ref 0.0–0.2)

## 2019-01-30 LAB — URIC ACID: Uric Acid, Serum: 7.5 mg/dL — ABNORMAL HIGH (ref 2.5–7.1)

## 2019-01-30 LAB — MAGNESIUM: Magnesium: 1.9 mg/dL (ref 1.7–2.4)

## 2019-01-30 LAB — TROPONIN I: Troponin I: 0.04 ng/mL

## 2019-01-30 LAB — BRAIN NATRIURETIC PEPTIDE: B Natriuretic Peptide: 328.8 pg/mL — ABNORMAL HIGH (ref 0.0–100.0)

## 2019-01-30 LAB — PHOSPHORUS: Phosphorus: 3.6 mg/dL (ref 2.5–4.6)

## 2019-01-30 MED ORDER — SODIUM CHLORIDE 0.9 % IV SOLN
1.0000 g | Freq: Once | INTRAVENOUS | Status: AC
  Administered 2019-01-30: 1 g via INTRAVENOUS
  Filled 2019-01-30: qty 10

## 2019-01-30 MED ORDER — MORPHINE SULFATE (PF) 4 MG/ML IV SOLN
6.0000 mg | INTRAVENOUS | Status: AC | PRN
  Administered 2019-01-30 (×2): 6 mg via INTRAVENOUS
  Filled 2019-01-30 (×2): qty 2

## 2019-01-30 MED ORDER — ONDANSETRON HCL 4 MG/2ML IJ SOLN
4.0000 mg | Freq: Once | INTRAMUSCULAR | Status: AC
  Administered 2019-01-30: 4 mg via INTRAVENOUS
  Filled 2019-01-30: qty 2

## 2019-01-30 MED ORDER — CEPHALEXIN 500 MG PO CAPS: 500.0000 mg | ORAL_CAPSULE | Freq: Three times a day (TID) | ORAL | 0 refills | Status: DC

## 2019-01-30 MED ORDER — IOHEXOL 350 MG/ML SOLN
100.0000 mL | Freq: Once | INTRAVENOUS | Status: AC | PRN
  Administered 2019-01-30: 11:00:00 100 mL via INTRAVENOUS

## 2019-01-30 MED ORDER — SODIUM CHLORIDE 0.9 % IV BOLUS
500.0000 mL | Freq: Once | INTRAVENOUS | Status: AC
  Administered 2019-01-30: 500 mL via INTRAVENOUS

## 2019-01-30 MED ORDER — HEPARIN SOD (PORK) LOCK FLUSH 100 UNIT/ML IV SOLN
500.0000 [IU] | Freq: Once | INTRAVENOUS | Status: AC
  Administered 2019-01-30: 500 [IU]
  Filled 2019-01-30: qty 5

## 2019-01-30 MED ORDER — HYDROCODONE-ACETAMINOPHEN 5-325 MG PO TABS: 1.0000 | ORAL_TABLET | Freq: Three times a day (TID) | ORAL | 0 refills | Status: AC | PRN

## 2019-01-30 MED ORDER — SODIUM CHLORIDE (PF) 0.9 % IJ SOLN
INTRAMUSCULAR | Status: AC
  Filled 2019-01-30: qty 50

## 2019-01-30 MED ORDER — ONDANSETRON 8 MG PO TBDP: 8.0000 mg | ORAL_TABLET | Freq: Three times a day (TID) | ORAL | 0 refills | Status: DC | PRN

## 2019-01-30 NOTE — ED Triage Notes (Signed)
Pt w/ hx of non-hodgkin's lymphoma. Complains of weakness, abdominal pain, nausea since receiving chemo 5 days ago. Pt took tylenol and nausea medication without relief. Pt states she feels short of breath, which she states she has felt since recieving chemo over the past 5 months. Pt denies fever or cough.

## 2019-01-30 NOTE — ED Notes (Signed)
ED provider at bedside.

## 2019-01-30 NOTE — Discharge Instructions (Addendum)
We saw you in the ER for the abdominal pain, nausea. All the results in the ER are normal, labs and imaging.  CT does not show any signs of obstruction, infection or progression of disease.  We are not sure what is causing your symptoms.  We suspect that you might be having a UTI that is the cause for your symptoms.  The workup in the ER is not complete, and is limited to screening for life threatening and emergent conditions only, so please see a primary care doctor for further evaluation.

## 2019-01-30 NOTE — ED Notes (Signed)
Bed: IY64 Expected date:  Expected time:  Means of arrival:  Comments: EMS 78yo [facility]

## 2019-01-30 NOTE — ED Notes (Signed)
Pt c/o constant abdominal pain and nausea. Reports taking pain medication and nausea medication at home with no relief. Pt appears to be SOB on assessment, pt reports this is normal for her since taking chemo 5 months ago. 02 sat 97% on RA. Will continue to monitor.

## 2019-01-30 NOTE — ED Notes (Signed)
Pt ambulatory to bathroom without assistance. Pt c/o feeling weak. SOB noticed with ambulation. Pt reports this is normal for her since starting chemo 5 months ago.

## 2019-01-30 NOTE — ED Provider Notes (Signed)
[facility] DEPT Provider Note   CSN: [id number] Arrival date & time: 01/30/19  0710    History   Chief Complaint Chief Complaint  Patient presents with   Abdominal Pain   Nausea    HPI Carrie [name] is a 78 y.o. female.     HPI  78 year old female with history of A. Fib on Eliquis, stage III CKD, non-Hodgkin's lymphoma on chemotherapy comes in a chief complaint of nausea, abdominal pain.  Patient reports that her symptoms started 3 or 4 days ago.  She is having intermittent abdominal pain that is located in the lower quadrant and is described as sharp pain.  The pain has been getting more frequent and more severe since last night which prompted her to come to the ER for further evaluation.  She has associated nausea without vomiting.  Her bowel movements have been normal.  Patient is status post hysterectomy and appendectomy.  She has had UTIs in the past, however does not recall having similar pain.  The pain is nonradiating and there is no burning with urination or blood in the urine.  Additionally patient is noted to be short of breath.  She has no history of known CHF no prior history of PE.  She reports that her shortness of breath has been present for the last 3 months.  The shortness of breath is typically exertional, but sometimes she has difficulty with it even at rest.  Her last chemotherapy session was 5 days ago.  She has never had similar symptoms with her chemo either.  CT scan were completed within the last few weeks and showed improvement of her lymphoma.  Past Medical History:  Diagnosis Date   A-fib ([place])    Anemia    "years ago"   Atrial fibrillation with rapid ventricular response ([place]) 06/25/2015   Carpal tunnel syndrome, bilateral    Cervical spondylosis without myelopathy 10/25/2013   Dental bridge present    DJD (degenerative joint disease)    Dysrhythmia    WENT INTO A FIB IN 2017    Elevated troponin  4/0/9811   Follicular lymphoma grade 3a ([place]) 02/03/2015   History of echocardiogram    Echo 12/16: EF 60-65%, no RWMA, severe LAE   History of nuclear stress test    Myoview 1/17: EF 55%, Normal study. No ischemia or scar.   Hypothyroid    Nausea without vomiting 06/20/2015   Pneumonia    Stage III chronic kidney disease ([place]) 07/01/2015   Thrush of mouth and esophagus ([place]) 06/25/2015    Patient Active Problem List   Diagnosis Date Noted   GERD with esophagitis 09/19/2018   Skin lesions 06/06/2017   Dyspnea on exertion 03/25/2017   Anxiety disorder 03/25/2017   Generalized weakness 03/19/2017   Insomnia disorder 03/19/2017   Dysuria 08/31/2016   Acute back pain with sciatica, right 08/31/2016   Chronic back pain 07/02/2016   Quality of life palliative care encounter 03/02/2016   Port catheter in place 03/01/2016   Chronic atrial fibrillation 09/08/2015   Essential hypertension 07/02/2015   Diabetes mellitus, likely due to steroids 06/27/2015   Chronic kidney disease, stage III (moderate) ([place]) 06/25/2015   Other fatigue 91/47/8295   Grade 3a follicular lymphoma of lymph nodes of multiple regions ([place]) 02/03/2015   Hypothyroidism 09/09/2009    Past Surgical History:  Procedure Laterality Date   ABDOMINAL HYSTERECTOMY     APPENDECTOMY     BACK SURGERY     X5-lumbar-fusion  COLONOSCOPY     DILATION AND CURETTAGE OF UTERUS     LYMPH NODE BIOPSY Right 01/21/2015   Procedure: RIGHT GROIN LYMPH NODE BIOPSY;  Surgeon: Erroll Luna, MD;  Location: [facility];  Service: General;  Laterality: Right;   MASS EXCISION Left 08/29/2018   Procedure: EXCISION LEFT BACK  MASS;  Surgeon: Erroll Luna, MD;  Location: [facility];  Service: General;  Laterality: Left;   Ovarian cyst resection     patelar tendon transplants     Left/right   PORTACATH PLACEMENT Right 02/13/2015   Procedure: INSERTION PORT-A-CATH WITH ULTRASOUND;  Surgeon: Erroll Luna, MD;  Location: [facility];  Service: General;  Laterality: Right;   PORTACATH PLACEMENT N/A 08/29/2018   Procedure: INSERTION PORT-A-CATH WITH ULTRA SOUND ERAS PATHWAY;  Surgeon: Erroll Luna, MD;  Location: [facility];  Service: General;  Laterality: N/A;   TOTAL KNEE ARTHROPLASTY  2011   Right   TUBAL LIGATION       OB History   No obstetric history on file.      Home Medications    Prior to Admission medications   Medication Sig Start Date End Date Taking? Authorizing Provider  acetaminophen (TYLENOL) 500 MG tablet Take 500 mg by mouth every 6 (six) hours as needed for moderate pain or headache.   Yes [provider]  apixaban (ELIQUIS) 5 MG TABS tablet Take 5 mg by mouth 2 (two) times daily.    Yes [provider]  bisacodyl (DULCOLAX) 5 MG EC tablet Take 5 mg by mouth daily as needed for moderate constipation.   Yes [provider]  diclofenac sodium (VOLTAREN) 1 % GEL Apply 2 g topically 3 (three) times daily as needed (knee pain). Patient taking differently: Apply 2 g topically 3 (three) times daily as needed (for back or knee pain).  08/01/18  Yes Quintella Reichert, MD  levothyroxine (SYNTHROID, LEVOTHROID) 112 MCG tablet Take 112 mcg by mouth daily.  04/22/15  Yes [provider]  lidocaine-prilocaine (EMLA) cream Apply to affected area once Patient taking differently: Apply 1 application topically as needed (port access). Apply to affected area once 09/07/18  Yes Nicholas Lose, MD  LORazepam (ATIVAN) 0.5 MG tablet Take 1 tablet (0.5 mg total) by mouth every 6 (six) hours as needed (Nausea or vomiting). 09/04/18  Yes Nicholas Lose, MD  ondansetron (ZOFRAN) 8 MG tablet Take 1 tablet (8 mg total) by mouth 2 (two) times daily as needed for refractory nausea / vomiting. Start on day 2 after bendamustine chemotherapy. 09/07/18  Yes Nicholas Lose, MD  OVER THE COUNTER MEDICATION Take 1 packet by mouth daily. OTC Vegetable and fruit vitamin packet  daily.   Yes [provider]  polyvinyl alcohol (LIQUIFILM TEARS) 1.4 % ophthalmic solution Place 1 drop into both eyes as needed for dry eyes.   Yes [provider]  prochlorperazine (COMPAZINE) 10 MG tablet Take 1 tablet (10 mg total) by mouth every 6 (six) hours as needed (Nausea or vomiting). 09/07/18  Yes Nicholas Lose, MD  acyclovir (ZOVIRAX) 400 MG tablet Take 1 tablet (400 mg total) by mouth daily. Patient not taking: Reported on 01/30/2019 09/04/18   Nicholas Lose, MD  cephALEXin (KEFLEX) 500 MG capsule Take 1 capsule (500 mg total) by mouth 3 (three) times daily. 01/30/19   Varney Biles, MD  HYDROcodone-acetaminophen (NORCO/VICODIN) 5-325 MG tablet Take 1 tablet by mouth every 8 (eight) hours as needed for up to 3 days for severe pain. 01/30/19 02/02/19  Varney Biles, MD  omeprazole (PRILOSEC) 40 MG capsule Take 1 capsule (40 mg total) by mouth daily. Patient not taking: Reported on 01/30/2019 09/19/18   [name], MD  ondansetron (ZOFRAN ODT) 8 MG disintegrating tablet Take 1 tablet (8 mg total) by mouth every 8 (eight) hours as needed for nausea. 01/30/19   Varney Biles, MD    Family History Family History  Problem Relation Age of Onset   Cancer Mother        Breast, lung NHL   Cancer Sister        Multiple myeloma    Social History Social History   Tobacco Use   Smoking status: Never Smoker   Smokeless tobacco: Never Used  Substance Use Topics   Alcohol use: Yes    Comment: occassional wine   Drug use: No     Allergies   Patient has no known allergies.   Review of Systems Review of Systems  Constitutional: Positive for activity change.  Respiratory: Positive for shortness of breath.   Cardiovascular: Negative for chest pain.  Gastrointestinal: Positive for abdominal pain and nausea. Negative for diarrhea and vomiting.  Allergic/Immunologic: Positive for immunocompromised state.  Hematological: Bruises/bleeds easily.  All other  systems reviewed and are negative.    Physical Exam Updated Vital Signs BP (!) 145/81    Pulse 83    Temp 98 F (36.7 C) (Oral)    Resp 15    SpO2 93%   Physical Exam Vitals signs and nursing note reviewed.  Constitutional:      Appearance: She is well-developed.  HENT:     Head: Normocephalic and atraumatic.  Neck:     Musculoskeletal: Normal range of motion and neck supple.  Cardiovascular:     Rate and Rhythm: Normal rate.  Pulmonary:     Effort: Pulmonary effort is normal.  Abdominal:     General: Bowel sounds are normal.     Tenderness: There is abdominal tenderness in the right lower quadrant, suprapubic area and left lower quadrant.     Hernia: No hernia is present.  Skin:    General: Skin is warm and dry.  Neurological:     Mental Status: She is alert and oriented to person, place, and time.      ED Treatments / Results  Labs (all labs ordered are listed, but only abnormal results are displayed) Labs Reviewed  CBC WITH DIFFERENTIAL/PLATELET - Abnormal; Notable for the following components:      Result Value   WBC 22.4 (*)    MCV 100.7 (*)    Neutro Abs 19.4 (*)    Lymphs Abs 0.2 (*)    Monocytes Absolute 2.1 (*)    Abs Immature Granulocytes 0.33 (*)    All other components within normal limits  COMPREHENSIVE METABOLIC PANEL - Abnormal; Notable for the following components:   Glucose, Bld 123 (*)    BUN 25 (*)    Creatinine, Ser 1.14 (*)    Alkaline Phosphatase 143 (*)    GFR calc non Af Amer 47 (*)    GFR calc Af Amer 54 (*)    All other components within normal limits  URIC ACID - Abnormal; Notable for the following components:   Uric Acid, Serum 7.5 (*)    All other components within normal limits  TROPONIN I - Abnormal; Notable for the following components:   Troponin I 0.04 (*)    All other components within normal limits  URINALYSIS, ROUTINE W REFLEX MICROSCOPIC -  Abnormal; Notable for the following components:   APPearance HAZY (*)    Hgb  urine dipstick SMALL (*)    Leukocytes,Ua MODERATE (*)    Bacteria, UA RARE (*)    All other components within normal limits  BRAIN NATRIURETIC PEPTIDE - Abnormal; Notable for the following components:   B Natriuretic Peptide 328.8 (*)    All other components within normal limits  URINE CULTURE  MAGNESIUM  PHOSPHORUS    EKG None  Radiology Ct Angio Chest Pe W And/or Wo Contrast  Result Date: 01/30/2019 CLINICAL DATA:  78 year old female with weakness and abdominal pain EXAM: CT ANGIOGRAPHY CHEST CT ABDOMEN AND PELVIS WITH CONTRAST TECHNIQUE: Multidetector CT imaging of the chest was performed using the standard protocol during bolus administration of intravenous contrast. Multiplanar CT image reconstructions and MIPs were obtained to evaluate the vascular anatomy. Multidetector CT imaging of the abdomen and pelvis was performed using the standard protocol during bolus administration of intravenous contrast. CONTRAST:  163m OMNIPAQUE IOHEXOL 350 MG/ML SOLN COMPARISON:  12/15/2018, PET-CT 08/08/2018, 09/14/2017 FINDINGS: CTA CHEST FINDINGS Cardiovascular: Heart: Heart size unchanged with cardiomegaly. No pericardial fluid/thickening. No significant coronary calcifications. Aorta: Unremarkable course, caliber, contour of the thoracic aorta. No aneurysm or dissection flap. No periaortic fluid. Pulmonary arteries: No central, lobar, segmental, or proximal subsegmental filling defects. Mediastinum/Nodes: No mediastinal adenopathy. Unremarkable appearance of the thoracic esophagus. Unremarkable appearance of the thoracic inlet and thyroid. Lungs/Pleura: Geographic ground-glass opacity in the dependent aspects of the lungs, similar to prior. No pneumothorax. No interlobular septal thickening. No pleural effusion. No pneumothorax. No endotracheal or endobronchial debris. 4 mm nodules associated with the minor fissure (image 61 of series 16), unchanged. Unchanged nodule of the left lower lobe measuring 4 mm  (image 72 of series 16). No confluent airspace disease. Musculoskeletal: No acute displaced fracture. Degenerative changes of the spine. No recurrent soft tissue adjacent to the left eleventh rib. No recurrent axillary adenopathy or chest wall adenopathy/nodules. Right chest wall port catheter via IJ approach. Review of the MIP images confirms the above findings. CT ABDOMEN and PELVIS FINDINGS Hepatobiliary: Unremarkable appearance of liver parenchyma. Mild intrahepatic biliary ductal dilatation, new from the comparison. No radiopaque stones of the gallbladder. No inflammatory changes adjacent to the gallbladder. The common bile duct measures 5 mm, slightly larger than the comparison. No radiopaque stones identified. Pancreas: Unremarkable Spleen: Unremarkable Adrenals/Urinary Tract: Unremarkable adrenal glands. Bilateral kidneys unremarkable without hydronephrosis or nephrolithiasis. Unremarkable course the bilateral ureters. Unremarkable urinary bladder. Stomach/Bowel: Hiatal hernia with otherwise unremarkable stomach. Unremarkable small bowel. No transition point or focal wall thickening. No inflammatory changes. Surgical changes of appendectomy. Mild stool burden. No inflammatory changes or wall thickening. Mild diverticular change without acute inflammation. Vascular/Lymphatic: Atherosclerotic changes are mild. No aneurysm or periaortic fluid. Bilateral iliac arteries and proximal femoral arteries patent. Unremarkable appearance of the venous system. Reproductive: Hysterectomy Other: No abdominal wall hernia. Musculoskeletal: Surgical changes of posterior lumbar interbody fusion with laminectomy of L3-L5. Streak artifact somewhat limits evaluation. Degenerative changes of the hips. Review of the MIP images confirms the above findings. IMPRESSION: Negative for pulmonary emboli.  No acute finding of the chest. No acute CT finding of abdomen/pelvis to account for abdominal pain. New mild intrahepatic ductal  dilatation with the common bile duct measuring 5 mm. This is of uncertain significance, as there is no CT evidence of cholelithiasis or radiopaque stones in the common bile duct. If there is concern for biliary colic/dyskinesia, correlation with lab values/patient presentation may be  useful. No evidence of progression/recurrence of lymphadenopathy Additional ancillary findings as above. Electronically Signed   By: Corrie Mckusick D.O.   On: 01/30/2019 11:14   Ct Abdomen Pelvis W Contrast  Result Date: 01/30/2019 CLINICAL DATA:  78 year old female with weakness and abdominal pain EXAM: CT ANGIOGRAPHY CHEST CT ABDOMEN AND PELVIS WITH CONTRAST TECHNIQUE: Multidetector CT imaging of the chest was performed using the standard protocol during bolus administration of intravenous contrast. Multiplanar CT image reconstructions and MIPs were obtained to evaluate the vascular anatomy. Multidetector CT imaging of the abdomen and pelvis was performed using the standard protocol during bolus administration of intravenous contrast. CONTRAST:  157m OMNIPAQUE IOHEXOL 350 MG/ML SOLN COMPARISON:  12/15/2018, PET-CT 08/08/2018, 09/14/2017 FINDINGS: CTA CHEST FINDINGS Cardiovascular: Heart: Heart size unchanged with cardiomegaly. No pericardial fluid/thickening. No significant coronary calcifications. Aorta: Unremarkable course, caliber, contour of the thoracic aorta. No aneurysm or dissection flap. No periaortic fluid. Pulmonary arteries: No central, lobar, segmental, or proximal subsegmental filling defects. Mediastinum/Nodes: No mediastinal adenopathy. Unremarkable appearance of the thoracic esophagus. Unremarkable appearance of the thoracic inlet and thyroid. Lungs/Pleura: Geographic ground-glass opacity in the dependent aspects of the lungs, similar to prior. No pneumothorax. No interlobular septal thickening. No pleural effusion. No pneumothorax. No endotracheal or endobronchial debris. 4 mm nodules associated with the minor  fissure (image 61 of series 16), unchanged. Unchanged nodule of the left lower lobe measuring 4 mm (image 72 of series 16). No confluent airspace disease. Musculoskeletal: No acute displaced fracture. Degenerative changes of the spine. No recurrent soft tissue adjacent to the left eleventh rib. No recurrent axillary adenopathy or chest wall adenopathy/nodules. Right chest wall port catheter via IJ approach. Review of the MIP images confirms the above findings. CT ABDOMEN and PELVIS FINDINGS Hepatobiliary: Unremarkable appearance of liver parenchyma. Mild intrahepatic biliary ductal dilatation, new from the comparison. No radiopaque stones of the gallbladder. No inflammatory changes adjacent to the gallbladder. The common bile duct measures 5 mm, slightly larger than the comparison. No radiopaque stones identified. Pancreas: Unremarkable Spleen: Unremarkable Adrenals/Urinary Tract: Unremarkable adrenal glands. Bilateral kidneys unremarkable without hydronephrosis or nephrolithiasis. Unremarkable course the bilateral ureters. Unremarkable urinary bladder. Stomach/Bowel: Hiatal hernia with otherwise unremarkable stomach. Unremarkable small bowel. No transition point or focal wall thickening. No inflammatory changes. Surgical changes of appendectomy. Mild stool burden. No inflammatory changes or wall thickening. Mild diverticular change without acute inflammation. Vascular/Lymphatic: Atherosclerotic changes are mild. No aneurysm or periaortic fluid. Bilateral iliac arteries and proximal femoral arteries patent. Unremarkable appearance of the venous system. Reproductive: Hysterectomy Other: No abdominal wall hernia. Musculoskeletal: Surgical changes of posterior lumbar interbody fusion with laminectomy of L3-L5. Streak artifact somewhat limits evaluation. Degenerative changes of the hips. Review of the MIP images confirms the above findings. IMPRESSION: Negative for pulmonary emboli.  No acute finding of the chest. No  acute CT finding of abdomen/pelvis to account for abdominal pain. New mild intrahepatic ductal dilatation with the common bile duct measuring 5 mm. This is of uncertain significance, as there is no CT evidence of cholelithiasis or radiopaque stones in the common bile duct. If there is concern for biliary colic/dyskinesia, correlation with lab values/patient presentation may be useful. No evidence of progression/recurrence of lymphadenopathy Additional ancillary findings as above. Electronically Signed   By: JCorrie MckusickD.O.   On: 01/30/2019 11:14    Procedures Procedures (including critical care time)  Medications Ordered in ED Medications  sodium chloride (PF) 0.9 % injection (has no administration in time range)  heparin lock flush  100 unit/mL (has no administration in time range)  ondansetron (ZOFRAN) injection 4 mg (4 mg Intravenous Given 01/30/19 0806)  sodium chloride 0.9 % bolus 500 mL (500 mLs Intravenous New Bag/Given 01/30/19 0817)  morphine 4 MG/ML injection 6 mg (6 mg Intravenous Given 01/30/19 1006)  cefTRIAXone (ROCEPHIN) 1 g in sodium chloride 0.9 % 100 mL IVPB (0 g Intravenous Stopped 01/30/19 1002)  sodium chloride 0.9 % bolus 500 mL (0 mLs Intravenous Stopped 01/30/19 1003)  iohexol (OMNIPAQUE) 350 MG/ML injection 100 mL (100 mLs Intravenous Contrast Given 01/30/19 1035)     Initial Impression / Assessment and Plan / ED Course  I have reviewed the triage vital signs and the nursing notes.  Pertinent labs & imaging results that were available during my care of the patient were reviewed by me and considered in my medical decision making (see chart for details).  Clinical Course as of Jan 30 1147  Tue Jan 30, 2019  0940 Urine analysis is positive for leukocytesm rare bacteria and 21-50 WBCs.  Patient has a white count of 22.4 with neutrophilia.  I discussed these findings with the patient.  I informed her that given her history of appendectomy and TAH/BSO and generalized nature  of this intermittent pain, my suspicion is low for any acute intra-abdominal pathology.  Given her urine has some inflammatory cells and she is having white count elevation with nausea, we can consider treating her with ceftriaxone plus Flagyl with strict ER return precautions.  Patient was not comfortable with that plan.  We discussed the aggressive option of CT abdomen pelvis with contrast in the setting of her CKD.  She prefers getting a CAT scan while she is here.  He understands the risk of contrast.  Given that patient wants a CT scan of the abdomen, I will add CT angios chest to her work-up.  She is on Eliquis for A. fib, therefore has a lower probability of PE, but she is noted to be short of breath even while she is simply speaking to me, and her shortness of breath is progressing.  Therefore we will get a CT angios PE in addition to BNP.  Patient also agrees with the CT angios of the chest.  We will give her additional 500 cc bolus.  Urinalysis, Routine w reflex microscopic(!) [AN]  1147 CT scan results are negative for any acute findings. We will discharge.  P.o. challenge passed. Strict ER return precautions have been discussed, and patient is agreeing with the plan and is comfortable with the workup done and the recommendations from the ER.   CT Angio Chest PE W and/or Wo Contrast [AN]    Clinical Course User Index [AN] Varney Biles, MD       77 year old female with history of A. fib, non-Hodgkin's lymphoma getting chemotherapy, CKD comes in with chief complaint of lower abdominal pain.  She has been having pain for the last 3 days and it is getting worse and she has associated nausea.  She had her last chemotherapy session 5 days ago.  She does not have diarrhea, fevers.  She is also noted to be short of breath, but denies any new cough.  Patient is status post appendectomy and total abdominal hysterectomy with bilateral oophorectomy.  She is noted to have lower quadrant  tenderness.  Initial differential diagnosis includes cystitis, diverticulitis. it is possible that the pain is related to cancer.  Final Clinical Impressions(s) / ED Diagnoses   Final diagnoses:  Lower urinary tract  infectious disease    ED Discharge Orders         Ordered    cephALEXin (KEFLEX) 500 MG capsule  3 times daily     01/30/19 1139    ondansetron (ZOFRAN ODT) 8 MG disintegrating tablet  Every 8 hours PRN     01/30/19 1139    HYDROcodone-acetaminophen (NORCO/VICODIN) 5-325 MG tablet  Every 8 hours PRN     01/30/19 [place], Zymiere Trostle, MD 01/30/19 1148

## 2019-01-30 NOTE — ED Notes (Signed)
Date and time results received: 01/30/19 0847 (use smartphrase ".now" to insert current time)  Test: Troponin Critical Value: 0.04  Name of Provider Notified: Kathrynn Humble  Orders Received? Or Actions Taken?: waiting for orders

## 2019-01-30 NOTE — ED Notes (Signed)
Patient transported to CT 

## 2019-01-31 ENCOUNTER — Telehealth: Payer: Self-pay

## 2019-01-31 LAB — URINE CULTURE

## 2019-01-31 NOTE — Telephone Encounter (Signed)
She called and left a message for the office to call her.  Called back and left a message asking her to call the office.

## 2019-01-31 NOTE — Telephone Encounter (Signed)
She called back. She went to the ER yesterday with UTI and will follow up with urologist if it is okay with the [facility]. Just FYI.

## 2019-02-01 DIAGNOSIS — N3000 Acute cystitis without hematuria: Secondary | ICD-10-CM | POA: Diagnosis not present

## 2019-02-01 DIAGNOSIS — R3982 Chronic bladder pain: Secondary | ICD-10-CM | POA: Diagnosis not present

## 2019-02-15 ENCOUNTER — Telehealth: Payer: Self-pay

## 2019-02-15 NOTE — Telephone Encounter (Signed)
She called and left a message to call her.  Called her back.She went to ER 4/14 for UTI and followed up with [facility] on 4/16. The repeat Urine was negative at [facility]. She completed Keflex Rx on 4/25. She is complaining of no energy and upper abdominal pain yesterday.  Denies fever and no pain today. Denies nausea, diarrhea and constipation.

## 2019-02-16 ENCOUNTER — Encounter (HOSPITAL_COMMUNITY): Payer: Self-pay

## 2019-02-16 ENCOUNTER — Emergency Department (HOSPITAL_COMMUNITY): Payer: Medicare HMO

## 2019-02-16 ENCOUNTER — Other Ambulatory Visit: Payer: Self-pay

## 2019-02-16 ENCOUNTER — Emergency Department (HOSPITAL_COMMUNITY)
Admission: EM | Admit: 2019-02-16 | Discharge: 2019-02-16 | Disposition: A | Discharge status: Home / Self Care | Payer: Medicare HMO | Attending: Emergency Medicine | Admitting: Emergency Medicine

## 2019-02-16 DIAGNOSIS — E039 Hypothyroidism, unspecified: Secondary | ICD-10-CM | POA: Insufficient documentation

## 2019-02-16 DIAGNOSIS — I130 Hypertensive heart and chronic kidney disease with heart failure and stage 1 through stage 4 chronic kidney disease, or unspecified chronic kidney disease: Secondary | ICD-10-CM | POA: Diagnosis not present

## 2019-02-16 DIAGNOSIS — Z7901 Long term (current) use of anticoagulants: Secondary | ICD-10-CM | POA: Diagnosis not present

## 2019-02-16 DIAGNOSIS — R311 Benign essential microscopic hematuria: Secondary | ICD-10-CM | POA: Diagnosis not present

## 2019-02-16 DIAGNOSIS — R42 Dizziness and giddiness: Secondary | ICD-10-CM | POA: Diagnosis not present

## 2019-02-16 DIAGNOSIS — I509 Heart failure, unspecified: Secondary | ICD-10-CM | POA: Diagnosis not present

## 2019-02-16 DIAGNOSIS — I110 Hypertensive heart disease with heart failure: Secondary | ICD-10-CM | POA: Diagnosis not present

## 2019-02-16 DIAGNOSIS — Z79899 Other long term (current) drug therapy: Secondary | ICD-10-CM | POA: Diagnosis not present

## 2019-02-16 DIAGNOSIS — R3121 Asymptomatic microscopic hematuria: Secondary | ICD-10-CM | POA: Diagnosis not present

## 2019-02-16 DIAGNOSIS — N183 Chronic kidney disease, stage 3 (moderate): Secondary | ICD-10-CM | POA: Insufficient documentation

## 2019-02-16 DIAGNOSIS — N3000 Acute cystitis without hematuria: Secondary | ICD-10-CM | POA: Diagnosis not present

## 2019-02-16 DIAGNOSIS — I4891 Unspecified atrial fibrillation: Secondary | ICD-10-CM | POA: Insufficient documentation

## 2019-02-16 DIAGNOSIS — R0602 Shortness of breath: Secondary | ICD-10-CM | POA: Diagnosis present

## 2019-02-16 LAB — PROTIME-INR
INR: 1.5 — ABNORMAL HIGH (ref 0.8–1.2)
Prothrombin Time: 17.5 seconds — ABNORMAL HIGH (ref 11.4–15.2)

## 2019-02-16 LAB — APTT: aPTT: 38 seconds — ABNORMAL HIGH (ref 24–36)

## 2019-02-16 LAB — BASIC METABOLIC PANEL
Anion gap: 8 (ref 5–15)
BUN: 36 mg/dL — ABNORMAL HIGH (ref 8–23)
CO2: 23 mmol/L (ref 22–32)
Calcium: 8.8 mg/dL — ABNORMAL LOW (ref 8.9–10.3)
Chloride: 106 mmol/L (ref 98–111)
Creatinine, Ser: 1.55 mg/dL — ABNORMAL HIGH (ref 0.44–1.00)
GFR calc Af Amer: 37 mL/min — ABNORMAL LOW (ref >60)
GFR calc non Af Amer: 32 mL/min — ABNORMAL LOW (ref >60)
Glucose, Bld: 142 mg/dL — ABNORMAL HIGH (ref 70–99)
Potassium: 4 mmol/L (ref 3.5–5.1)
Sodium: 137 mmol/L (ref 135–145)

## 2019-02-16 LAB — CBC
HCT: 36.6 % (ref 36.0–46.0)
Hemoglobin: 12 g/dL (ref 12.0–15.0)
MCH: 32.8 pg (ref 26.0–34.0)
MCHC: 32.8 g/dL (ref 30.0–36.0)
MCV: 100 fL (ref 80.0–100.0)
Platelets: 179 10*3/uL (ref 150–400)
RBC: 3.66 MIL/uL — ABNORMAL LOW (ref 3.87–5.11)
RDW: 13.3 % (ref 11.5–15.5)
WBC: 8.3 10*3/uL (ref 4.0–10.5)
nRBC: 0 % (ref 0.0–0.2)

## 2019-02-16 LAB — URINALYSIS, ROUTINE W REFLEX MICROSCOPIC
Bilirubin Urine: NEGATIVE
Glucose, UA: NEGATIVE mg/dL
Ketones, ur: NEGATIVE mg/dL
Leukocytes,Ua: NEGATIVE
Nitrite: NEGATIVE
Protein, ur: NEGATIVE mg/dL
Specific Gravity, Urine: 1.014 (ref 1.005–1.030)
pH: 5 (ref 5.0–8.0)

## 2019-02-16 LAB — TSH: TSH: 1.162 u[IU]/mL (ref 0.350–4.500)

## 2019-02-16 LAB — MAGNESIUM: Magnesium: 2.1 mg/dL (ref 1.7–2.4)

## 2019-02-16 LAB — BRAIN NATRIURETIC PEPTIDE: B Natriuretic Peptide: 1207.1 pg/mL — ABNORMAL HIGH (ref 0.0–100.0)

## 2019-02-16 MED ORDER — POTASSIUM CHLORIDE CRYS ER 20 MEQ PO TBCR: 20.0000 meq | EXTENDED_RELEASE_TABLET | Freq: Two times a day (BID) | ORAL | 0 refills | Status: DC

## 2019-02-16 MED ORDER — HEPARIN SOD (PORK) LOCK FLUSH 100 UNIT/ML IV SOLN
500.0000 [IU] | Freq: Once | INTRAVENOUS | Status: AC
  Administered 2019-02-16: 15:00:00 500 [IU]
  Filled 2019-02-16: qty 5

## 2019-02-16 MED ORDER — FUROSEMIDE 40 MG PO TABS
40.0000 mg | ORAL_TABLET | Freq: Once | ORAL | Status: AC
  Administered 2019-02-16: 15:00:00 40 mg via ORAL
  Filled 2019-02-16: qty 1

## 2019-02-16 MED ORDER — ETOMIDATE 2 MG/ML IV SOLN
10.0000 mg | Freq: Once | INTRAVENOUS | Status: AC
  Administered 2019-02-16: 13:00:00 6 mg via INTRAVENOUS
  Filled 2019-02-16: qty 10

## 2019-02-16 MED ORDER — FUROSEMIDE 20 MG PO TABS: 40.0000 mg | ORAL_TABLET | Freq: Every day | ORAL | 0 refills | Status: DC

## 2019-02-16 MED ORDER — POTASSIUM CHLORIDE CRYS ER 20 MEQ PO TBCR
20.0000 meq | EXTENDED_RELEASE_TABLET | Freq: Once | ORAL | Status: AC
  Administered 2019-02-16: 20 meq via ORAL
  Filled 2019-02-16: qty 1

## 2019-02-16 NOTE — Telephone Encounter (Signed)
Called and given below message. She verbalized understanding. She has called the urologist and they offered appt today.

## 2019-02-16 NOTE — ED Triage Notes (Signed)
Patient reports that she was at her follow up Urology appointment for a UTI and reported that she has had SB and weakness since 12/30/18 that has been getting worse. Patient was sent to the ED to r/o Covid-19. Patient denies fever or cough.

## 2019-02-16 NOTE — ED Provider Notes (Addendum)
[facility] DEPT Provider Note   CSN: [id number] Arrival date & time: 02/16/19  1115    History   Chief Complaint Chief Complaint  Patient presents with  . Shortness of Breath  . Fatigue    HPI Carrie [name] is a 78 y.o. female.     HPI  78 year old female with history of A. fib on Eliquis, CKD, lymphoma getting chemotherapy comes in a chief complaint of weakness and shortness of breath.  Patient reports that she was seen in the ER 2 weeks ago.  At that time she was diagnosed with UTI and advised to follow-up with urologist.  She has followed up with urology service on 2 different occasions, and her urine results have come back normal on both occasions.  However she has been having some weakness for the last few days.  Today patient went to the urologist and they advised her to come to the ER for further evaluation COVID-19.  Patient denies any chest pain, cough, fevers, chills.  She does indicate that she is been having some shortness of breath, particularly with exertion along with the fatigue.  She denies any sick contacts.  She stays with her boyfriend, and they have not gone outside.  Patient denies any new abdominal pain, back pain, UTI-like symptoms, severe headaches, URI-like symptoms, rash.  Of note, I noted that patient is tachycardic on my exam.  She is also in A. fib with RVR.  Patient denies any palpitations.  Further questioning does reveal that patient has been having episodes of dizziness and near fainting when she walks.  Past Medical History:  Diagnosis Date  . A-fib ([place])   . Anemia    "years ago"  . Atrial fibrillation with rapid ventricular response ([place]) 06/25/2015  . Carpal tunnel syndrome, bilateral   . Cervical spondylosis without myelopathy 10/25/2013  . Dental bridge present   . DJD (degenerative joint disease)   . Dysrhythmia    WENT INTO A FIB IN 2017   . Elevated troponin 02/18/2015  . Follicular lymphoma grade 3a  ([place]) 02/03/2015  . History of echocardiogram    Echo 12/16: EF 60-65%, no RWMA, severe LAE  . History of nuclear stress test    Myoview 1/17: EF 55%, Normal study. No ischemia or scar.  . Hypothyroid   . Nausea without vomiting 06/20/2015  . Pneumonia   . Stage III chronic kidney disease ([place]) 07/01/2015  . Thrush of mouth and esophagus ([place]) 06/25/2015    Patient Active Problem List   Diagnosis Date Noted  . GERD with esophagitis 09/19/2018  . Skin lesions 06/06/2017  . Dyspnea on exertion 03/25/2017  . Anxiety disorder 03/25/2017  . Generalized weakness 03/19/2017  . Insomnia disorder 03/19/2017  . Dysuria 08/31/2016  . Acute back pain with sciatica, right 08/31/2016  . Chronic back pain 07/02/2016  . Quality of life palliative care encounter 03/02/2016  . Port catheter in place 03/01/2016  . Chronic atrial fibrillation 09/08/2015  . Essential hypertension 07/02/2015  . Diabetes mellitus, likely due to steroids 06/27/2015  . Chronic kidney disease, stage III (moderate) ([place]) 06/25/2015  . Other fatigue 03/07/2015  . Grade 3a follicular lymphoma of lymph nodes of multiple regions ([place]) 02/03/2015  . Hypothyroidism 09/09/2009    Past Surgical History:  Procedure Laterality Date  . ABDOMINAL HYSTERECTOMY    . APPENDECTOMY    . BACK SURGERY     X5-lumbar-fusion  . COLONOSCOPY    . DILATION AND CURETTAGE OF UTERUS    .  LYMPH NODE BIOPSY Right 01/21/2015   Procedure: RIGHT GROIN LYMPH NODE BIOPSY;  Surgeon: Erroll Luna, MD;  Location: [facility];  Service: General;  Laterality: Right;  . MASS EXCISION Left 08/29/2018   Procedure: EXCISION LEFT BACK  MASS;  Surgeon: Erroll Luna, MD;  Location: [facility];  Service: General;  Laterality: Left;  . Ovarian cyst resection    . patelar tendon transplants     Left/right  . PORTACATH PLACEMENT Right 02/13/2015   Procedure: INSERTION PORT-A-CATH WITH ULTRASOUND;  Surgeon: Erroll Luna, MD;  Location: [facility];  Service:  General;  Laterality: Right;  . PORTACATH PLACEMENT N/A 08/29/2018   Procedure: INSERTION PORT-A-CATH WITH ULTRA SOUND ERAS PATHWAY;  Surgeon: Erroll Luna, MD;  Location: [facility];  Service: General;  Laterality: N/A;  . TOTAL KNEE ARTHROPLASTY  2011   Right  . TUBAL LIGATION       OB History   No obstetric history on file.      Home Medications    Prior to Admission medications   Medication Sig Start Date End Date Taking? Authorizing Provider  acetaminophen (TYLENOL) 500 MG tablet Take 500 mg by mouth every 6 (six) hours as needed for moderate pain or headache.    [provider]  acyclovir (ZOVIRAX) 400 MG tablet Take 1 tablet (400 mg total) by mouth daily. Patient not taking: Reported on 01/30/2019 09/04/18   Nicholas Lose, MD  apixaban (ELIQUIS) 5 MG TABS tablet Take 5 mg by mouth 2 (two) times daily.     [provider]  bisacodyl (DULCOLAX) 5 MG EC tablet Take 5 mg by mouth daily as needed for moderate constipation.    [provider]  cephALEXin (KEFLEX) 500 MG capsule Take 1 capsule (500 mg total) by mouth 3 (three) times daily. 01/30/19   Varney Biles, MD  diclofenac sodium (VOLTAREN) 1 % GEL Apply 2 g topically 3 (three) times daily as needed (knee pain). Patient taking differently: Apply 2 g topically 3 (three) times daily as needed (for back or knee pain).  08/01/18   Quintella Reichert, MD  levothyroxine (SYNTHROID, LEVOTHROID) 112 MCG tablet Take 112 mcg by mouth daily.  04/22/15   [provider]  lidocaine-prilocaine (EMLA) cream Apply to affected area once Patient taking differently: Apply 1 application topically as needed (port access). Apply to affected area once 09/07/18   Nicholas Lose, MD  LORazepam (ATIVAN) 0.5 MG tablet Take 1 tablet (0.5 mg total) by mouth every 6 (six) hours as needed (Nausea or vomiting). 09/04/18   Nicholas Lose, MD  omeprazole (PRILOSEC) 40 MG capsule Take 1 capsule (40 mg total) by mouth daily. Patient not  taking: Reported on 01/30/2019 09/19/18   [name], MD  ondansetron (ZOFRAN ODT) 8 MG disintegrating tablet Take 1 tablet (8 mg total) by mouth every 8 (eight) hours as needed for nausea. 01/30/19   Varney Biles, MD  ondansetron (ZOFRAN) 8 MG tablet Take 1 tablet (8 mg total) by mouth 2 (two) times daily as needed for refractory nausea / vomiting. Start on day 2 after bendamustine chemotherapy. Patient not taking: Reported on 02/16/2019 09/07/18   Nicholas Lose, MD  OVER THE COUNTER MEDICATION Take 1 packet by mouth daily. OTC Vegetable and fruit vitamin packet daily.    [provider]  polyvinyl alcohol (LIQUIFILM TEARS) 1.4 % ophthalmic solution Place 1 drop into both eyes as needed for dry eyes.    [provider]  prochlorperazine (COMPAZINE) 10 MG tablet Take 1  tablet (10 mg total) by mouth every 6 (six) hours as needed (Nausea or vomiting). 09/07/18   Nicholas Lose, MD    Family History Family History  Problem Relation Age of Onset  . Cancer Mother        Breast, lung NHL  . Cancer Sister        Multiple myeloma    Social History Social History   Tobacco Use  . Smoking status: Never Smoker  . Smokeless tobacco: Never Used  Substance Use Topics  . Alcohol use: Yes    Comment: occassional wine  . Drug use: No     Allergies   Patient has no known allergies.   Review of Systems Review of Systems  Constitutional: Positive for activity change and fatigue.  Respiratory: Positive for shortness of breath.   Cardiovascular: Negative for palpitations.  Gastrointestinal: Negative for nausea and vomiting.  Neurological: Positive for dizziness and light-headedness.  All other systems reviewed and are negative.    Physical Exam Updated Vital Signs BP 128/86   Pulse 82   Temp 98.4 F (36.9 C) (Oral)   Resp 18   Ht '5\' 1"'  (1.549 m)   Wt 64 kg   SpO2 100%   BMI 26.64 kg/m   Physical Exam Vitals signs and nursing note reviewed.  Constitutional:       Appearance: She is well-developed.  HENT:     Head: Normocephalic and atraumatic.  Neck:     Musculoskeletal: Normal range of motion and neck supple.  Cardiovascular:     Rate and Rhythm: Tachycardia present. Rhythm irregular.  Pulmonary:     Effort: Pulmonary effort is normal.     Breath sounds: No decreased breath sounds, wheezing, rhonchi or rales.  Abdominal:     General: Bowel sounds are normal.  Musculoskeletal:     Right lower leg: She exhibits no tenderness. No edema.     Left lower leg: She exhibits no tenderness. No edema.  Skin:    General: Skin is warm and dry.  Neurological:     Mental Status: She is alert and oriented to person, place, and time.      ED Treatments / Results  Labs (all labs ordered are listed, but only abnormal results are displayed) Labs Reviewed  BASIC METABOLIC PANEL - Abnormal; Notable for the following components:      Result Value   Glucose, Bld 142 (*)    BUN 36 (*)    Creatinine, Ser 1.55 (*)    Calcium 8.8 (*)    GFR calc non Af Amer 32 (*)    GFR calc Af Amer 37 (*)    All other components within normal limits  CBC - Abnormal; Notable for the following components:   RBC 3.66 (*)    All other components within normal limits  BRAIN NATRIURETIC PEPTIDE - Abnormal; Notable for the following components:   B Natriuretic Peptide 1,207.1 (*)    All other components within normal limits  PROTIME-INR - Abnormal; Notable for the following components:   Prothrombin Time 17.5 (*)    INR 1.5 (*)    All other components within normal limits  APTT - Abnormal; Notable for the following components:   aPTT 38 (*)    All other components within normal limits  URINALYSIS, ROUTINE W REFLEX MICROSCOPIC - Abnormal; Notable for the following components:   Hgb urine dipstick SMALL (*)    Bacteria, UA RARE (*)    All other components within normal limits  MAGNESIUM  TSH    EKG  ED ECG REPORT   Date: 02/16/2019  Rate: 153  Rhythm: atrial  fibrillation  QRS Axis: normal  Intervals: normal  ST/T Wave abnormalities: nonspecific ST/T changes  Conduction Disutrbances:none  Narrative Interpretation:   Old EKG Reviewed: changes noted  I have personally reviewed the EKG tracing and agree with the computerized printout as noted.    EKG Interpretation  Date/Time:  Friday Feb 16 2019 13:25:36 EDT Ventricular Rate:  84 PR Interval:    QRS Duration: 79 QT Interval:  379 QTC Calculation: 448 R Axis:   92 Text Interpretation:  Sinus rhythm Atrial premature complexes in couplets Right axis deviation Borderline abnrm T, anterolateral leads SINUS RHYTHM RESTORED Confirmed by Varney Biles ([id number]) on 02/16/2019 2:28:25 PM   Radiology Dg Chest Port 1 View  Result Date: 02/16/2019 CLINICAL DATA:  Progressive shortness of breath. EXAM: PORTABLE CHEST 1 VIEW COMPARISON:  CT chest dated January 30, 2019. Chest x-ray dated October 10, 2018. FINDINGS: Unchanged right chest wall port catheter. Stable cardiomegaly. Normal pulmonary vascularity. Minimal bibasilar atelectasis. No focal consolidation, pleural effusion, or pneumothorax. No acute osseous abnormality. IMPRESSION: No active disease. Electronically Signed   By: Titus Dubin M.D.   On: 02/16/2019 13:16    Procedures .Cardioversion Date/Time: 02/16/2019 2:24 PM Performed by: Varney Biles, MD Authorized by: Varney Biles, MD   Consent:    Consent obtained:  Written   Consent given by:  Patient   Risks discussed:  Cutaneous burn, death, induced arrhythmia and pain   Alternatives discussed:  Rate-control medication Universal protocol:    Procedure explained and questions answered to patient or proxy's satisfaction: yes     Relevant documents present and verified: yes     Test results available and properly labeled: yes     Imaging studies available: yes     Required blood products, implants, devices, and special equipment available: yes     Site/side marked: yes      Immediately prior to procedure a time out was called: yes     Patient identity confirmed:  Arm band Pre-procedure details:    Cardioversion basis:  Elective   Rhythm:  Atrial fibrillation   Electrode placement:  Anterior-posterior Patient sedated: Yes. Refer to sedation procedure documentation for details of sedation.  Attempt one:    Cardioversion mode:  Synchronous   Waveform:  Biphasic   Shock (Joules):  200   Shock outcome:  Conversion to normal sinus rhythm Post-procedure details:    Patient status:  Alert   Patient tolerance of procedure:  Tolerated well, no immediate complications .Sedation Date/Time: 02/16/2019 2:25 PM Performed by: Varney Biles, MD Authorized by: Varney Biles, MD   Consent:    Consent obtained:  Written   Consent given by:  Patient   Risks discussed:  Allergic reaction, dysrhythmia, inadequate sedation, nausea, prolonged hypoxia resulting in organ damage, prolonged sedation necessitating reversal, respiratory compromise necessitating ventilatory assistance and intubation and vomiting   Alternatives discussed:  Analgesia without sedation, anxiolysis and regional anesthesia Universal protocol:    Procedure explained and questions answered to patient or proxy's satisfaction: yes     Relevant documents present and verified: yes     Test results available and properly labeled: yes     Imaging studies available: yes     Required blood products, implants, devices, and special equipment available: yes     Site/side marked: yes     Immediately prior to procedure a time out was called:  yes     Patient identity confirmation method:  Verbally with patient Indications:    Procedure necessitating sedation performed by:  Physician performing sedation Pre-sedation assessment:    Time since last food or drink:  6 HOURS   ASA classification: class 4 - patient with severe systemic disease that is a constant threat to life     Neck mobility: normal     Mouth  opening:  3 or more finger widths   Thyromental distance:  4 finger widths   Mallampati score:  I - soft palate, uvula, fauces, pillars visible   Pre-sedation assessments completed and reviewed: airway patency, cardiovascular function, hydration status, mental status, nausea/vomiting, pain level, respiratory function and temperature     Pre-sedation assessment completed:  02/16/2019 1:00 PM Immediate pre-procedure details:    Reassessment: Patient reassessed immediately prior to procedure     Reviewed: vital signs, relevant labs/tests and NPO status     Verified: bag valve mask available, emergency equipment available, intubation equipment available, IV patency confirmed, oxygen available and suction available   Procedure details (see MAR for exact dosages):    Preoxygenation:  Nasal cannula   Sedation:  Etomidate   Intra-procedure monitoring:  Blood pressure monitoring, cardiac monitor, continuous pulse oximetry, frequent LOC assessments, frequent vital sign checks and continuous capnometry   Intra-procedure events: none     Total Provider sedation time (minutes):  25 Post-procedure details:    Post-sedation assessment completed:  02/16/2019 2:26 PM   Attendance: Constant attendance by certified staff until patient recovered     Recovery: Patient returned to pre-procedure baseline     Post-sedation assessments completed and reviewed: airway patency, cardiovascular function, hydration status, mental status, nausea/vomiting, pain level, respiratory function and temperature     Patient is stable for discharge or admission: yes     Patient tolerance:  Tolerated well, no immediate complications .Critical Care Performed by: Varney Biles, MD Authorized by: Varney Biles, MD   Critical care provider statement:    Critical care time (minutes):  33   Critical care time was exclusive of:  Separately billable procedures and treating other patients   Critical care was necessary to treat or prevent  imminent or life-threatening deterioration of the following conditions:  Cardiac failure   Critical care was time spent personally by me on the following activities:  Discussions with consultants, evaluation of patient's response to treatment, examination of patient, ordering and performing treatments and interventions, ordering and review of laboratory studies, ordering and review of radiographic studies, pulse oximetry, re-evaluation of patient's condition, obtaining history from patient or surrogate, review of old charts and development of treatment plan with patient or surrogate   (including critical care time)  Medications Ordered in ED Medications  furosemide (LASIX) tablet 40 mg (has no administration in time range)  potassium chloride SA (K-DUR) CR tablet 20 mEq (has no administration in time range)  etomidate (AMIDATE) injection 10 mg (6 mg Intravenous Given 02/16/19 1324)     Initial Impression / Assessment and Plan / ED Course  I have reviewed the triage vital signs and the nursing notes.  Pertinent labs & imaging results that were available during my care of the patient were reviewed by me and considered in my medical decision making (see chart for details).  Clinical Course as of Feb 16 1427  Fri Feb 16, 2019  1427 Patient is back to baseline. She had no complications with surgery.   [AN]    Clinical Course User Index [AN]  Varney Biles, MD       78 year old female with history of A. fib, lymphoma sent to the emergency room with concerns for COVID-19.  Patient is noted to have A. fib with RVR.  She has paroxysmal A. fib, but it appears clinically that she is symptomatic from her current A. fib with RVR and perhaps even in CHF given that she is having exertional shortness of breath.  Patient's lung exam however is normal.  Based on history there is no specific evoking factor for this CHF.  Patient does not have any infection-like symptoms for sure.  Clinical suspicion  for COVID-19 is the underlying cause for decompensation of the A. fib is very low.  Basic labs ordered along with x-ray.  There is no evidence of severe CHF on x-ray, however BNP is over 1200.  She is also noted to have slightly elevated BUN/creatinine ratio, consistent with perfusion deficits that is likely prerenal in nature.  Patient consented for procedural sedation with cardioversion.  She has been taking her Eliquis as prescribed.  Patient was eventually successfully cardioverted in the ER.  I spoke with Dr. Lovena Le, EP cardiology to see if he recommends putting patient on Lasix given the CHF type findings.  Dr. Lovena Le recommends that patient get to 3 days of 40 mg oral Lasix along with oral potassium.  They are still seeing patients virtually in the A. fib clinic, and patient can follow-up with them.  This patients CHA2DS2-VASc Score and unadjusted Ischemic Stroke Rate (% per year) is equal to 3.2 % stroke rate/year from a score of 3  Above score calculated as 1 point each if present [CHF, HTN, DM, Vascular=MI/PAD/Aortic Plaque, Age if 65-74, or Female] Above score calculated as 2 points each if present [Age > 75, or Stroke/TIA/TE]     Carrie [name] was evaluated in Emergency Department on 02/16/2019 for the symptoms described in the history of present illness. She was evaluated in the context of the global COVID-19 pandemic, which necessitated consideration that the patient might be at risk for infection with the SARS-CoV-2 virus that causes COVID-19. Institutional protocols and algorithms that pertain to the evaluation of patients at risk for COVID-19 are in a state of rapid change based on information released by regulatory bodies including the CDC and federal and state organizations. These policies and algorithms were followed during the patient's care in the ED.   Final Clinical Impressions(s) / ED Diagnoses   Final diagnoses:  Atrial fibrillation with RVR (HCC)  Acute congestive  heart failure, unspecified heart failure type [facility])    ED Discharge Orders         Ordered    Amb referral to AFIB Clinic     02/16/19 1159           Varney Biles, MD 02/16/19 [place], Sephora Boyar, MD 02/16/19 1429

## 2019-02-16 NOTE — Telephone Encounter (Signed)
She had CT done which showed no signs of lymphoma Probably degenerative arthritis. She had back surgery done before She needs to contact her primary doctor or orthopedic surgeon for pain management

## 2019-02-16 NOTE — Discharge Instructions (Addendum)
We saw in the ER for shortness of breath and weakness. Our work-up showed that you were in a condition called A. fib with RVR. We were able to restore a normal rhythm after performing electric cardioversion.  We still think that you will need close follow-up with the cardiologist.  Please contact the phone number provided if you do not hear back from the cardiology service by Monday afternoon. In the interim, take the medication as prescribed.  Return to the ER immediately if you start having chest pain, palpitations, dizziness, near fainting spells, shortness of breath.

## 2019-02-16 NOTE — ED Notes (Signed)
Pt is post cardioversion. Pt is responsive to voice stimuli. Pt is currently in SR with occasional PAC. Pt tolerated the procedure well.

## 2019-02-19 ENCOUNTER — Telehealth (HOSPITAL_COMMUNITY): Payer: Self-pay | Admitting: *Deleted

## 2019-02-19 NOTE — Telephone Encounter (Signed)
Patient called back stating she has a cardiologist at the [facility] in [place] that she would prefer to see. She will be calling them this morning to get an appt. If she cannot get an appt soon she will call back to schedule with [facility] Clinic.

## 2019-02-19 NOTE — Telephone Encounter (Signed)
Left msg for pt to [facility] to sched f/u appt..  Pt dccv in the ED 02/16/19.  Pt referred to afib dept

## 2019-02-21 ENCOUNTER — Other Ambulatory Visit: Payer: No Typology Code available for payment source

## 2019-02-21 ENCOUNTER — Ambulatory Visit: Payer: No Typology Code available for payment source | Admitting: Hematology and Oncology

## 2019-02-21 ENCOUNTER — Ambulatory Visit: Payer: No Typology Code available for payment source

## 2019-02-22 ENCOUNTER — Ambulatory Visit: Payer: No Typology Code available for payment source

## 2019-02-25 ENCOUNTER — Other Ambulatory Visit: Payer: Self-pay

## 2019-02-25 ENCOUNTER — Inpatient Hospital Stay (HOSPITAL_COMMUNITY): Payer: Medicare HMO

## 2019-02-25 ENCOUNTER — Inpatient Hospital Stay (HOSPITAL_COMMUNITY)
Admission: EM | Admit: 2019-02-25 | Discharge: 2019-02-28 | DRG: 308 | Disposition: A | Discharge status: Home / Self Care | Payer: Medicare HMO | Attending: Family Medicine | Admitting: Family Medicine

## 2019-02-25 ENCOUNTER — Emergency Department (HOSPITAL_COMMUNITY): Payer: Medicare HMO

## 2019-02-25 ENCOUNTER — Encounter (HOSPITAL_COMMUNITY): Payer: Self-pay

## 2019-02-25 DIAGNOSIS — Z807 Family history of other malignant neoplasms of lymphoid, hematopoietic and related tissues: Secondary | ICD-10-CM

## 2019-02-25 DIAGNOSIS — T461X5A Adverse effect of calcium-channel blockers, initial encounter: Secondary | ICD-10-CM | POA: Diagnosis present

## 2019-02-25 DIAGNOSIS — I5031 Acute diastolic (congestive) heart failure: Secondary | ICD-10-CM

## 2019-02-25 DIAGNOSIS — I952 Hypotension due to drugs: Secondary | ICD-10-CM | POA: Diagnosis present

## 2019-02-25 DIAGNOSIS — G8929 Other chronic pain: Secondary | ICD-10-CM | POA: Diagnosis present

## 2019-02-25 DIAGNOSIS — G4700 Insomnia, unspecified: Secondary | ICD-10-CM | POA: Diagnosis present

## 2019-02-25 DIAGNOSIS — F064 Anxiety disorder due to known physiological condition: Secondary | ICD-10-CM | POA: Diagnosis not present

## 2019-02-25 DIAGNOSIS — R531 Weakness: Secondary | ICD-10-CM | POA: Diagnosis not present

## 2019-02-25 DIAGNOSIS — F419 Anxiety disorder, unspecified: Secondary | ICD-10-CM | POA: Diagnosis present

## 2019-02-25 DIAGNOSIS — M5440 Lumbago with sciatica, unspecified side: Secondary | ICD-10-CM | POA: Diagnosis not present

## 2019-02-25 DIAGNOSIS — N179 Acute kidney failure, unspecified: Secondary | ICD-10-CM | POA: Diagnosis not present

## 2019-02-25 DIAGNOSIS — I361 Nonrheumatic tricuspid (valve) insufficiency: Secondary | ICD-10-CM

## 2019-02-25 DIAGNOSIS — N183 Chronic kidney disease, stage 3 unspecified: Secondary | ICD-10-CM | POA: Diagnosis present

## 2019-02-25 DIAGNOSIS — I4891 Unspecified atrial fibrillation: Secondary | ICD-10-CM

## 2019-02-25 DIAGNOSIS — Z7901 Long term (current) use of anticoagulants: Secondary | ICD-10-CM | POA: Diagnosis not present

## 2019-02-25 DIAGNOSIS — I10 Essential (primary) hypertension: Secondary | ICD-10-CM | POA: Diagnosis present

## 2019-02-25 DIAGNOSIS — I340 Nonrheumatic mitral (valve) insufficiency: Secondary | ICD-10-CM | POA: Diagnosis present

## 2019-02-25 DIAGNOSIS — M5441 Lumbago with sciatica, right side: Secondary | ICD-10-CM | POA: Diagnosis present

## 2019-02-25 DIAGNOSIS — R69 Illness, unspecified: Secondary | ICD-10-CM | POA: Diagnosis not present

## 2019-02-25 DIAGNOSIS — K210 Gastro-esophageal reflux disease with esophagitis: Secondary | ICD-10-CM | POA: Diagnosis not present

## 2019-02-25 DIAGNOSIS — I482 Chronic atrial fibrillation, unspecified: Secondary | ICD-10-CM | POA: Diagnosis present

## 2019-02-25 DIAGNOSIS — M549 Dorsalgia, unspecified: Secondary | ICD-10-CM | POA: Diagnosis present

## 2019-02-25 DIAGNOSIS — R0600 Dyspnea, unspecified: Secondary | ICD-10-CM | POA: Diagnosis present

## 2019-02-25 DIAGNOSIS — Z96651 Presence of right artificial knee joint: Secondary | ICD-10-CM | POA: Diagnosis present

## 2019-02-25 DIAGNOSIS — M1990 Unspecified osteoarthritis, unspecified site: Secondary | ICD-10-CM | POA: Diagnosis present

## 2019-02-25 DIAGNOSIS — E038 Other specified hypothyroidism: Secondary | ICD-10-CM | POA: Diagnosis not present

## 2019-02-25 DIAGNOSIS — R0602 Shortness of breath: Secondary | ICD-10-CM | POA: Diagnosis present

## 2019-02-25 DIAGNOSIS — I5021 Acute systolic (congestive) heart failure: Secondary | ICD-10-CM | POA: Diagnosis not present

## 2019-02-25 DIAGNOSIS — F5101 Primary insomnia: Secondary | ICD-10-CM | POA: Diagnosis not present

## 2019-02-25 DIAGNOSIS — I480 Paroxysmal atrial fibrillation: Secondary | ICD-10-CM | POA: Diagnosis not present

## 2019-02-25 DIAGNOSIS — E039 Hypothyroidism, unspecified: Secondary | ICD-10-CM | POA: Diagnosis present

## 2019-02-25 DIAGNOSIS — Z79890 Hormone replacement therapy (postmenopausal): Secondary | ICD-10-CM

## 2019-02-25 DIAGNOSIS — I130 Hypertensive heart and chronic kidney disease with heart failure and stage 1 through stage 4 chronic kidney disease, or unspecified chronic kidney disease: Secondary | ICD-10-CM | POA: Diagnosis not present

## 2019-02-25 DIAGNOSIS — Z1159 Encounter for screening for other viral diseases: Secondary | ICD-10-CM | POA: Diagnosis not present

## 2019-02-25 DIAGNOSIS — C8238 Follicular lymphoma grade IIIa, lymph nodes of multiple sites: Secondary | ICD-10-CM | POA: Diagnosis not present

## 2019-02-25 DIAGNOSIS — R0609 Other forms of dyspnea: Secondary | ICD-10-CM

## 2019-02-25 DIAGNOSIS — N1831 Chronic kidney disease, stage 3a: Secondary | ICD-10-CM | POA: Diagnosis present

## 2019-02-25 LAB — SARS CORONAVIRUS 2 BY RT PCR (HOSPITAL ORDER, PERFORMED IN ~~LOC~~ HOSPITAL LAB): SARS Coronavirus 2: NEGATIVE

## 2019-02-25 LAB — APTT: aPTT: 33 seconds (ref 24–36)

## 2019-02-25 LAB — BASIC METABOLIC PANEL WITH GFR
Anion gap: 9 (ref 5–15)
BUN: 32 mg/dL — ABNORMAL HIGH (ref 8–23)
CO2: 22 mmol/L (ref 22–32)
Calcium: 8.9 mg/dL (ref 8.9–10.3)
Chloride: 108 mmol/L (ref 98–111)
Creatinine, Ser: 1.16 mg/dL — ABNORMAL HIGH (ref 0.44–1.00)
GFR calc Af Amer: 53 mL/min — ABNORMAL LOW
GFR calc non Af Amer: 46 mL/min — ABNORMAL LOW
Glucose, Bld: 86 mg/dL (ref 70–99)
Potassium: 4.3 mmol/L (ref 3.5–5.1)
Sodium: 139 mmol/L (ref 135–145)

## 2019-02-25 LAB — BRAIN NATRIURETIC PEPTIDE: B Natriuretic Peptide: 979.1 pg/mL — ABNORMAL HIGH (ref 0.0–100.0)

## 2019-02-25 LAB — ECHOCARDIOGRAM LIMITED
Height: 61 in
Weight: 2208 oz

## 2019-02-25 LAB — CBC
HCT: 39.3 % (ref 36.0–46.0)
Hemoglobin: 12.8 g/dL (ref 12.0–15.0)
MCH: 32.4 pg (ref 26.0–34.0)
MCHC: 32.6 g/dL (ref 30.0–36.0)
MCV: 99.5 fL (ref 80.0–100.0)
Platelets: 227 10*3/uL (ref 150–400)
RBC: 3.95 MIL/uL (ref 3.87–5.11)
RDW: 13.1 % (ref 11.5–15.5)
WBC: 5.9 10*3/uL (ref 4.0–10.5)
nRBC: 0 % (ref 0.0–0.2)

## 2019-02-25 LAB — MAGNESIUM: Magnesium: 2.3 mg/dL (ref 1.7–2.4)

## 2019-02-25 MED ORDER — ASPIRIN EC 81 MG PO TBEC
81.0000 mg | DELAYED_RELEASE_TABLET | Freq: Every day | ORAL | Status: DC
  Filled 2019-02-25: qty 1

## 2019-02-25 MED ORDER — LORAZEPAM 0.5 MG PO TABS: 0.5000 mg | ORAL_TABLET | Freq: Four times a day (QID) | ORAL | Status: DC | PRN

## 2019-02-25 MED ORDER — AMIODARONE HCL IN DEXTROSE 360-4.14 MG/200ML-% IV SOLN
30.0000 mg/h | INTRAVENOUS | Status: DC
  Administered 2019-02-25 – 2019-02-27 (×4): 30 mg/h via INTRAVENOUS
  Filled 2019-02-25 (×4): qty 200

## 2019-02-25 MED ORDER — AMIODARONE LOAD VIA INFUSION
150.0000 mg | Freq: Once | INTRAVENOUS | Status: AC
  Administered 2019-02-25: 150 mg via INTRAVENOUS
  Filled 2019-02-25: qty 83.34

## 2019-02-25 MED ORDER — DILTIAZEM HCL 100 MG IV SOLR
5.0000 mg/h | INTRAVENOUS | Status: DC
  Administered 2019-02-25: 5 mg/h via INTRAVENOUS
  Filled 2019-02-25: qty 100

## 2019-02-25 MED ORDER — DILTIAZEM LOAD VIA INFUSION
15.0000 mg | Freq: Once | INTRAVENOUS | Status: AC
  Administered 2019-02-25: 15 mg via INTRAVENOUS
  Filled 2019-02-25: qty 15

## 2019-02-25 MED ORDER — TRAMADOL HCL 50 MG PO TABS
50.0000 mg | ORAL_TABLET | Freq: Four times a day (QID) | ORAL | Status: DC | PRN
  Administered 2019-02-26: 50 mg via ORAL
  Filled 2019-02-25: qty 1

## 2019-02-25 MED ORDER — HEPARIN (PORCINE) 25000 UT/250ML-% IV SOLN
900.0000 [IU]/h | INTRAVENOUS | Status: DC
  Administered 2019-02-25: 900 [IU]/h via INTRAVENOUS
  Filled 2019-02-25: qty 250

## 2019-02-25 MED ORDER — LEVOTHYROXINE SODIUM 112 MCG PO TABS: 112.0000 ug | ORAL_TABLET | Freq: Every day | ORAL | Status: DC

## 2019-02-25 MED ORDER — LORAZEPAM 2 MG/ML IJ SOLN: 0.5000 mg | Freq: Four times a day (QID) | INTRAMUSCULAR | Status: DC | PRN

## 2019-02-25 MED ORDER — DICLOFENAC SODIUM 1 % TD GEL: 2.0000 g | Freq: Three times a day (TID) | TRANSDERMAL | Status: DC | PRN

## 2019-02-25 MED ORDER — APIXABAN 5 MG PO TABS: 5.0000 mg | ORAL_TABLET | Freq: Two times a day (BID) | ORAL | Status: DC

## 2019-02-25 MED ORDER — SODIUM CHLORIDE 0.9% FLUSH
10.0000 mL | INTRAVENOUS | Status: DC | PRN
  Administered 2019-02-27 – 2019-02-28 (×2): 10 mL
  Filled 2019-02-25 (×2): qty 40

## 2019-02-25 MED ORDER — AMIODARONE HCL IN DEXTROSE 360-4.14 MG/200ML-% IV SOLN
60.0000 mg/h | INTRAVENOUS | Status: DC
  Administered 2019-02-25 (×2): 60 mg/h via INTRAVENOUS
  Filled 2019-02-25: qty 200

## 2019-02-25 MED ORDER — ONDANSETRON 4 MG PO TBDP: 8.0000 mg | ORAL_TABLET | Freq: Three times a day (TID) | ORAL | Status: DC | PRN

## 2019-02-25 MED ORDER — LEVOTHYROXINE SODIUM 112 MCG PO TABS
112.0000 ug | ORAL_TABLET | Freq: Every day | ORAL | Status: DC
  Administered 2019-02-26 – 2019-02-28 (×3): 112 ug via ORAL
  Filled 2019-02-25 (×3): qty 1

## 2019-02-25 MED ORDER — FUROSEMIDE 10 MG/ML IJ SOLN
40.0000 mg | Freq: Two times a day (BID) | INTRAMUSCULAR | Status: AC
  Administered 2019-02-25 – 2019-02-26 (×2): 40 mg via INTRAVENOUS
  Filled 2019-02-25 (×2): qty 4

## 2019-02-25 MED ORDER — SODIUM CHLORIDE 0.9 % IV SOLN
INTRAVENOUS | Status: DC
  Administered 2019-02-26: 03:00:00 via INTRAVENOUS

## 2019-02-25 NOTE — ED Provider Notes (Signed)
Recurrent rapid atrial fibrillation. [facility] DEPT Provider Note   CSN: [id number] Arrival date & time: 02/25/19  1213    History   Chief Complaint Chief Complaint  Patient presents with  . Weakness  . Shortness of Breath    HPI Carrie [name] is a 78 y.o. female.     HPI Patient presents to the emergency room for evaluation of shortness of breath and generalized weakness.  Patient has a history of atrial fibrillation.  She had similar symptoms fatigue shortness of breath earlier this month.  She was seen in the emergency room on May 1.  She was found to be in rapid A. fib and underwent cardioversion.  Patient states she followed up with a cardiologist at the [facility].  She has been wearing an outpatient monitor.  Patient states this morning she started to feel fatigued and short of breath.  She did not feel her heart racing.  She denies any chest pain.  No leg swelling. Past Medical History:  Diagnosis Date  . A-fib ([place])   . Anemia    "years ago"  . Atrial fibrillation with rapid ventricular response ([place]) 06/25/2015  . Carpal tunnel syndrome, bilateral   . Cervical spondylosis without myelopathy 10/25/2013  . Dental bridge present   . DJD (degenerative joint disease)   . Dysrhythmia    WENT INTO A FIB IN 2017   . Elevated troponin 02/18/2015  . Follicular lymphoma grade 3a ([place]) 02/03/2015  . History of echocardiogram    Echo 12/16: EF 60-65%, no RWMA, severe LAE  . History of nuclear stress test    Myoview 1/17: EF 55%, Normal study. No ischemia or scar.  . Hypothyroid   . Nausea without vomiting 06/20/2015  . Pneumonia   . Stage III chronic kidney disease ([place]) 07/01/2015  . Thrush of mouth and esophagus ([place]) 06/25/2015    Patient Active Problem List   Diagnosis Date Noted  . GERD with esophagitis 09/19/2018  . Skin lesions 06/06/2017  . Dyspnea on exertion 03/25/2017  . Anxiety disorder 03/25/2017  . Generalized weakness 03/19/2017  .  Insomnia disorder 03/19/2017  . Dysuria 08/31/2016  . Acute back pain with sciatica, right 08/31/2016  . Chronic back pain 07/02/2016  . Quality of life palliative care encounter 03/02/2016  . Port catheter in place 03/01/2016  . Chronic atrial fibrillation 09/08/2015  . Essential hypertension 07/02/2015  . Diabetes mellitus, likely due to steroids 06/27/2015  . Chronic kidney disease, stage III (moderate) ([place]) 06/25/2015  . Other fatigue 03/07/2015  . Grade 3a follicular lymphoma of lymph nodes of multiple regions ([place]) 02/03/2015  . Hypothyroidism 09/09/2009    Past Surgical History:  Procedure Laterality Date  . ABDOMINAL HYSTERECTOMY    . APPENDECTOMY    . BACK SURGERY     X5-lumbar-fusion  . COLONOSCOPY    . DILATION AND CURETTAGE OF UTERUS    . LYMPH NODE BIOPSY Right 01/21/2015   Procedure: RIGHT GROIN LYMPH NODE BIOPSY;  Surgeon: Erroll Luna, MD;  Location: [place];  Service: General;  Laterality: Right;  . MASS EXCISION Left 08/29/2018   Procedure: EXCISION LEFT BACK  MASS;  Surgeon: Erroll Luna, MD;  Location: [facility];  Service: General;  Laterality: Left;  . Ovarian cyst resection    . patelar tendon transplants     Left/right  . PORTACATH PLACEMENT Right 02/13/2015   Procedure: INSERTION PORT-A-CATH WITH ULTRASOUND;  Surgeon: Erroll Luna, MD;  Location: [facility];  Service: General;  Laterality: Right;  . PORTACATH PLACEMENT N/A 08/29/2018   Procedure: INSERTION PORT-A-CATH WITH ULTRA SOUND ERAS PATHWAY;  Surgeon: Erroll Luna, MD;  Location: [facility];  Service: General;  Laterality: N/A;  . TOTAL KNEE ARTHROPLASTY  2011   Right  . TUBAL LIGATION       OB History   No obstetric history on file.      Home Medications    Prior to Admission medications   Medication Sig Start Date End Date Taking? Authorizing Provider  acetaminophen (TYLENOL) 500 MG tablet Take 500 mg by mouth every 6 (six) hours as needed for moderate pain or headache.     [provider]  acyclovir (ZOVIRAX) 400 MG tablet Take 1 tablet (400 mg total) by mouth daily. Patient not taking: Reported on 01/30/2019 09/04/18   Nicholas Lose, MD  apixaban (ELIQUIS) 5 MG TABS tablet Take 5 mg by mouth 2 (two) times daily.     [provider]  bisacodyl (DULCOLAX) 5 MG EC tablet Take 5 mg by mouth daily as needed for moderate constipation.    [provider]  cephALEXin (KEFLEX) 500 MG capsule Take 1 capsule (500 mg total) by mouth 3 (three) times daily. 01/30/19   Varney Biles, MD  diclofenac sodium (VOLTAREN) 1 % GEL Apply 2 g topically 3 (three) times daily as needed (knee pain). Patient taking differently: Apply 2 g topically 3 (three) times daily as needed (for back or knee pain).  08/01/18   Quintella Reichert, MD  furosemide (LASIX) 20 MG tablet Take 2 tablets (40 mg total) by mouth daily. 02/17/19   Varney Biles, MD  levothyroxine (SYNTHROID, LEVOTHROID) 112 MCG tablet Take 112 mcg by mouth daily.  04/22/15   [provider]  lidocaine-prilocaine (EMLA) cream Apply to affected area once Patient taking differently: Apply 1 application topically as needed (port access). Apply to affected area once 09/07/18   Nicholas Lose, MD  LORazepam (ATIVAN) 0.5 MG tablet Take 1 tablet (0.5 mg total) by mouth every 6 (six) hours as needed (Nausea or vomiting). 09/04/18   Nicholas Lose, MD  omeprazole (PRILOSEC) 40 MG capsule Take 1 capsule (40 mg total) by mouth daily. Patient not taking: Reported on 01/30/2019 09/19/18   [name], MD  ondansetron (ZOFRAN ODT) 8 MG disintegrating tablet Take 1 tablet (8 mg total) by mouth every 8 (eight) hours as needed for nausea. 01/30/19   Varney Biles, MD  ondansetron (ZOFRAN) 8 MG tablet Take 1 tablet (8 mg total) by mouth 2 (two) times daily as needed for refractory nausea / vomiting. Start on day 2 after bendamustine chemotherapy. Patient not taking: Reported on 02/16/2019 09/07/18   Nicholas Lose, MD  OVER  THE COUNTER MEDICATION Take 1 packet by mouth daily. OTC Vegetable and fruit vitamin packet daily.    [provider]  polyvinyl alcohol (LIQUIFILM TEARS) 1.4 % ophthalmic solution Place 1 drop into both eyes as needed for dry eyes.    [provider]  potassium chloride SA (K-DUR) 20 MEQ tablet Take 1 tablet (20 mEq total) by mouth 2 (two) times daily. 02/17/19   Varney Biles, MD  prochlorperazine (COMPAZINE) 10 MG tablet Take 1 tablet (10 mg total) by mouth every 6 (six) hours as needed (Nausea or vomiting). 09/07/18   Nicholas Lose, MD    Family History Family History  Problem Relation Age of Onset  . Cancer Mother        Breast, lung NHL  . Cancer Sister  Multiple myeloma    Social History Social History   Tobacco Use  . Smoking status: Never Smoker  . Smokeless tobacco: Never Used  Substance Use Topics  . Alcohol use: Yes    Comment: occassional wine  . Drug use: No     Allergies   Patient has no known allergies.   Review of Systems Review of Systems  All other systems reviewed and are negative.    Physical Exam Updated Vital Signs BP (!) 143/78 (BP Location: Left Arm)   Pulse 91   Temp (!) 97.5 F (36.4 C)   Resp 19   Ht 1.549 m ('5\' 1"' )   Wt 62.6 kg   SpO2 97%   BMI 26.07 kg/m   Physical Exam Vitals signs and nursing note reviewed.  Constitutional:      General: She is not in acute distress.    Appearance: She is well-developed.  HENT:     Head: Normocephalic and atraumatic.     Right Ear: External ear normal.     Left Ear: External ear normal.  Eyes:     General: No scleral icterus.       Right eye: No discharge.        Left eye: No discharge.     Conjunctiva/sclera: Conjunctivae normal.  Neck:     Musculoskeletal: Neck supple.     Trachea: No tracheal deviation.  Cardiovascular:     Rate and Rhythm: Tachycardia present. Rhythm irregularly irregular.  Pulmonary:     Effort: Pulmonary effort is normal. No  respiratory distress.     Breath sounds: Normal breath sounds. No stridor. No wheezing or rales.  Abdominal:     General: Bowel sounds are normal. There is no distension.     Palpations: Abdomen is soft.     Tenderness: There is no abdominal tenderness. There is no guarding or rebound.  Musculoskeletal:        General: No tenderness.  Skin:    General: Skin is warm and dry.     Findings: No rash.  Neurological:     Mental Status: She is alert.     Cranial Nerves: No cranial nerve deficit (no facial droop, extraocular movements intact, no slurred speech).     Sensory: No sensory deficit.     Motor: No abnormal muscle tone or seizure activity.     Coordination: Coordination normal.      ED Treatments / Results  Labs (all labs ordered are listed, but only abnormal results are displayed) Labs Reviewed  BASIC METABOLIC PANEL - Abnormal; Notable for the following components:      Result Value   BUN 32 (*)    Creatinine, Ser 1.16 (*)    GFR calc non Af Amer 46 (*)    GFR calc Af Amer 53 (*)    All other components within normal limits  BRAIN NATRIURETIC PEPTIDE - Abnormal; Notable for the following components:   B Natriuretic Peptide 979.1 (*)    All other components within normal limits  SARS CORONAVIRUS 2 (HOSPITAL ORDER, [facility])  MAGNESIUM  CBC    EKG EKG Interpretation  Date/Time:  Sunday Feb 25 2019 12:27:17 EDT Ventricular Rate:  139 PR Interval:    QRS Duration: 75 QT Interval:  342 QTC Calculation: 521 R Axis:   49 Text Interpretation:  Atrial fibrillation with rapid V-rate Nonspecific T abnormalities, lateral leads Atrial fibrillation is new since last tracing Confirmed by Dorie Rank [phone]) on 02/25/2019  12:36:06 PM   Radiology Dg Chest Port 1 View  Result Date: 02/25/2019 CLINICAL DATA:  Chest pain, weakness EXAM: PORTABLE CHEST 1 VIEW COMPARISON:  02/16/2019 FINDINGS: Unchanged AP portable examination with cardiomegaly. No  airspace opacity. Right chest port catheter. Left chest loop recorder. IMPRESSION: Unchanged AP portable examination with cardiomegaly. No airspace opacity. Right chest port catheter. Left chest loop recorder. Electronically Signed   By: Eddie Candle M.D.   On: 02/25/2019 13:09    Procedures .Critical Care Performed by: Dorie Rank, MD Authorized by: Dorie Rank, MD   Critical care provider statement:    Critical care time (minutes):  45   Critical care was time spent personally by me on the following activities:  Discussions with consultants, evaluation of patient's response to treatment, examination of patient, ordering and performing treatments and interventions, ordering and review of laboratory studies, ordering and review of radiographic studies, pulse oximetry, re-evaluation of patient's condition, obtaining history from patient or surrogate and review of old charts   (including critical care time)  Medications Ordered in ED Medications  diltiazem (CARDIZEM) 1 mg/mL load via infusion 15 mg (15 mg Intravenous Bolus from Bag 02/25/19 1333)    And  diltiazem (CARDIZEM) 100 mg in dextrose 5 % 100 mL (1 mg/mL) infusion (5 mg/hr Intravenous New Bag/Given 02/25/19 1329)     Initial Impression / Assessment and Plan / ED Course  I have reviewed the triage vital signs and the nursing notes.  Pertinent labs & imaging results that were available during my care of the patient were reviewed by me and considered in my medical decision making (see chart for details).  Clinical Course as of Feb 25 1424  Sun Feb 25, 2019  1406 HR has improved on Cardizem drip.   BP has improved   [JK]  1413 D/w Dr Percival Spanish.  Would not recommend cardioversion at this time.  Recommends admission to get her rate stabilized.  I will call medical service, cardiology will consult.   [JK]    Clinical Course User Index [JK] Dorie Rank, MD     Patient presented to the emergency room with complaints of fatigue and  shortness of breath.  Patient actually does not appreciate her tachycardia.  She only notices the fatigue and shortness of breath.  It is unclear when she went back in atrial fibrillation.  Patient is anticoagulated but she does not seem to be remaining in normal sinus rhythm.  Patient is now rate controlled on a Cardizem infusion.  Plan on admission to the hospital to get her stabilized on oral regimen.  Patient would like to continue to follow-up with her outpatient cardiologist at the [facility] when she is able.  Final Clinical Impressions(s) / ED Diagnoses   Final diagnoses:  Atrial fibrillation with rapid ventricular response (HCC)      Dorie Rank, MD 02/25/19 1425

## 2019-02-25 NOTE — ED Notes (Signed)
ED TO INPATIENT HANDOFF REPORT  Name/Age/Gender Carrie [name] 78 y.o. female  Code Status    Code Status Orders  (From admission, onward)         Start     Ordered   02/25/19 1531  Full code  Continuous     02/25/19 1533        Code Status History    Date Active Date Inactive Code Status Order ID Comments User Context   06/25/2015 1530 07/06/2015 2245 Full Code [id number]  Rama, Venetia Maxon, MD ED    Advance Directive Documentation     Most Recent Value  Type of Advance Directive  Healthcare Power of Attorney, Living will  Pre-existing out of facility DNR order (yellow form or pink MOST form)  -  "MOST" Form in Place?  -      Home/SNF/Other Home  Chief Complaint weakness, pt on heart monitor  Level of Care/Admitting Diagnosis ED Disposition    ED Disposition Condition [place]: [place] [[id number]]  Level of Care: Telemetry [5]  Admit to tele based on following criteria: Complex arrhythmia (Bradycardia/Tachycardia)  Covid Evaluation: N/A  Diagnosis: Atrial fibrillation with RVR [facility]) [[id number]]  Admitting Physician: Allie Bossier [[id number]]  Attending Physician: Allie Bossier [[id number]]  Estimated length of stay: 3 - 4 days  Certification:: I certify this patient will need inpatient services for at least 2 midnights  PT Class (Do Not Modify): Inpatient [101]  PT Acc Code (Do Not Modify): Private [1]       Medical History Past Medical History:  Diagnosis Date  . A-fib ([place])   . Anemia    "years ago"  . Atrial fibrillation with rapid ventricular response ([place]) 06/25/2015  . Carpal tunnel syndrome, bilateral   . Cervical spondylosis without myelopathy 10/25/2013  . Dental bridge present   . DJD (degenerative joint disease)   . Dysrhythmia    WENT INTO A FIB IN 2017   . Elevated troponin 02/18/2015  . Follicular lymphoma grade 3a ([place]) 02/03/2015  . History of echocardiogram    Echo 12/16: EF 60-65%, no RWMA, severe  LAE  . History of nuclear stress test    Myoview 1/17: EF 55%, Normal study. No ischemia or scar.  . Hypothyroid   . Nausea without vomiting 06/20/2015  . Pneumonia   . Stage III chronic kidney disease ([place]) 07/01/2015  . Thrush of mouth and esophagus ([place]) 06/25/2015    Allergies No Known Allergies  IV Location/Drains/Wounds Patient Lines/Drains/Airways Status   Active Line/Drains/Airways    Name:   Placement date:   Placement time:   Site:   Days:   Implanted Port 08/29/18 Right Chest   08/29/18    0942    Chest   180          Labs/Imaging Results for orders placed or performed during the hospital encounter of 02/25/19 (from the past 48 hour(s))  Basic metabolic panel     Status: Abnormal   Collection Time: 02/25/19 12:43 PM  Result Value Ref Range   Sodium 139 135 - 145 mmol/L   Potassium 4.3 3.5 - 5.1 mmol/L   Chloride 108 98 - 111 mmol/L   CO2 22 22 - 32 mmol/L   Glucose, Bld 86 70 - 99 mg/dL   BUN 32 (H) 8 - 23 mg/dL   Creatinine, Ser 1.16 (H) 0.44 - 1.00 mg/dL   Calcium 8.9 8.9 - 10.3 mg/dL   GFR  calc non Af Amer 46 (L) >60 mL/min   GFR calc Af Amer 53 (L) >60 mL/min   Anion gap 9 5 - 15    Comment: Performed at [facility], [place] [address], [place], [place] [address]  Magnesium     Status: None   Collection Time: 02/25/19 12:43 PM  Result Value Ref Range   Magnesium 2.3 1.7 - 2.4 mg/dL    Comment: Performed at [facility], [place] [address] [place], [place] [address]  CBC     Status: None   Collection Time: 02/25/19 12:43 PM  Result Value Ref Range   WBC 5.9 4.0 - 10.5 K/uL   RBC 3.95 3.87 - 5.11 MIL/uL   Hemoglobin 12.8 12.0 - 15.0 g/dL   HCT 39.3 36.0 - 46.0 %   MCV 99.5 80.0 - 100.0 fL   MCH 32.4 26.0 - 34.0 pg   MCHC 32.6 30.0 - 36.0 g/dL   RDW 13.1 11.5 - 15.5 %   Platelets 227 150 - 400 K/uL   nRBC 0.0 0.0 - 0.2 %    Comment: Performed at [facility], [place] [address], [place], [place] [address]   Brain natriuretic peptide (IF shortness of breath has been documented this visit)     Status: Abnormal   Collection Time: 02/25/19 12:43 PM  Result Value Ref Range   B Natriuretic Peptide 979.1 (H) 0.0 - 100.0 pg/mL    Comment: Performed at [facility], [place] [address] [place], [place] [address]  SARS Coronavirus 2 (CEPHEID - Performed in [facility] hospital lab), Hosp Order     Status: None   Collection Time: 02/25/19  2:30 PM  Result Value Ref Range   SARS Coronavirus 2 NEGATIVE NEGATIVE    Comment: (NOTE) If result is NEGATIVE SARS-CoV-2 target nucleic acids are NOT DETECTED. The SARS-CoV-2 RNA is generally detectable in upper and lower  respiratory specimens during the acute phase of infection. The lowest  concentration of SARS-CoV-2 viral copies this assay can detect is 250  copies / mL. A negative result does not preclude SARS-CoV-2 infection  and should not be used as the sole basis for treatment or other  patient management decisions.  A negative result may occur with  improper specimen collection / handling, submission of specimen other  than nasopharyngeal swab, presence of viral mutation(s) within the  areas targeted by this assay, and inadequate number of viral copies  (<250 copies / mL). A negative result must be combined with clinical  observations, patient history, and epidemiological information. If result is POSITIVE SARS-CoV-2 target nucleic acids are DETECTED. The SARS-CoV-2 RNA is generally detectable in upper and lower  respiratory specimens dur ing the acute phase of infection.  Positive  results are indicative of active infection with SARS-CoV-2.  Clinical  correlation with patient history and other diagnostic information is  necessary to determine patient infection status.  Positive results do  not rule out bacterial infection or co-infection with other viruses. If result is PRESUMPTIVE POSTIVE SARS-CoV-2 nucleic acids MAY BE PRESENT.    A presumptive positive result was obtained on the submitted specimen  and confirmed on repeat testing.  While 2019 novel coronavirus  (SARS-CoV-2) nucleic acids may be present in the submitted sample  additional confirmatory testing may be necessary for epidemiological  and / or clinical management purposes  to differentiate between  SARS-CoV-2 and other Sarbecovirus currently known to infect humans.  If clinically indicated additional testing  with an alternate test  methodology [phone]) is advised. The SARS-CoV-2 RNA is generally  detectable in upper and lower respiratory sp ecimens during the acute  phase of infection. The expected result is Negative. Fact Sheet for Patients:  StrictlyIdeas.no Fact Sheet for Healthcare Providers: BankingDealers.co.za This test is not yet approved or cleared by the [place] FDA and has been authorized for detection and/or diagnosis of SARS-CoV-2 by FDA under an Emergency Use Authorization (EUA).  This EUA will remain in effect (meaning this test can be used) for the duration of the COVID-19 declaration under Section 564(b)(1) of the Act, 21 U.S.C. section 360bbb-3(b)(1), unless the authorization is terminated or revoked sooner. Performed at [facility], [place] [address], [place], [place] [address]    Dg Chest Port 1 View  Result Date: 02/25/2019 CLINICAL DATA:  Chest pain, weakness EXAM: PORTABLE CHEST 1 VIEW COMPARISON:  02/16/2019 FINDINGS: Unchanged AP portable examination with cardiomegaly. No airspace opacity. Right chest port catheter. Left chest loop recorder. IMPRESSION: Unchanged AP portable examination with cardiomegaly. No airspace opacity. Right chest port catheter. Left chest loop recorder. Electronically Signed   By: Eddie Candle M.D.   On: 02/25/2019 13:09    Pending Labs Unresulted Labs (From admission, onward)    Start     Ordered   02/26/19 3267  Basic  metabolic panel  Daily,   R     02/25/19 1533   02/26/19 0500  Magnesium  Daily,   R     02/25/19 1533   02/26/19 0500  CBC  Daily,   R     02/25/19 1533          Vitals/Pain Today's Vitals   02/25/19 1530 02/25/19 1545 02/25/19 1600 02/25/19 1630  BP: (!) 128/96 (!) 141/101 126/77 107/89  Pulse: (!) 47 (!) 51 (!) 59 62  Resp: (!) 21 (!) 21 15 (!) 23  Temp:      SpO2: 100% 100% 99% 100%  Weight:      Height:      PainSc:        Isolation Precautions No active isolations  Medications Medications  amiodarone (NEXTERONE) 1.8 mg/mL load via infusion 150 mg (150 mg Intravenous Bolus from Bag 02/25/19 1541)    And  amiodarone (NEXTERONE PREMIX) 360-4.14 MG/200ML-% (1.8 mg/mL) IV infusion (60 mg/hr Intravenous New Bag/Given 02/25/19 1544)    And  amiodarone (NEXTERONE PREMIX) 360-4.14 MG/200ML-% (1.8 mg/mL) IV infusion (has no administration in time range)  furosemide (LASIX) injection 40 mg (has no administration in time range)  aspirin EC tablet 81 mg (has no administration in time range)  apixaban (ELIQUIS) tablet 5 mg (has no administration in time range)  diltiazem (CARDIZEM) 1 mg/mL load via infusion 15 mg (15 mg Intravenous Bolus from Bag 02/25/19 1333)    Mobility walks

## 2019-02-25 NOTE — ED Notes (Signed)
Echo-Cardiogram Tech at bedside.

## 2019-02-25 NOTE — ED Triage Notes (Signed)
Pt was seen here on 02/16/19. Pt states she had a cardioversion at that time. PT states that since last night she has been [facility] and having generalized weakness.

## 2019-02-25 NOTE — ED Notes (Signed)
RN will stop Cardizem once Amiodarone drip is validated by Pharmacy.

## 2019-02-25 NOTE — ED Notes (Signed)
XR at bedside

## 2019-02-25 NOTE — ED Notes (Signed)
Hosptialist at bedside 

## 2019-02-25 NOTE — ED Notes (Signed)
Report given to [facility] for 4W, room 1425.

## 2019-02-25 NOTE — ED Notes (Addendum)
Cardiologist at bedside with patient.

## 2019-02-25 NOTE — Progress Notes (Signed)
  Echocardiogram 2D Echocardiogram has been performed.  Carrie [name] 02/25/2019, 4:51 PM

## 2019-02-25 NOTE — ED Notes (Signed)
ED Provider at bedside. 

## 2019-02-25 NOTE — Consult Note (Signed)
Cardiology Consultation:   Patient ID: Carrie [name] MRN: [id number]; DOB: [birth year]  Admit date: 02/25/2019 Date of Consult: 02/25/2019  Primary Care Provider: Reubin Milan, MD Primary Cardiologist: Dr C   Patient Profile:   Carrie [name] is a 78 y.o. female with a hx of PAF who is being seen today for the evaluation of ATRIAL FIBRILLATION WITH RVR at the request of Dr Dorie Rank.  History of Present Illness:   Carrie [name] is a 78 y.o. female with a hx of HTN, follicular lymphoma grade 3A, negative stress test in 2010, admitted in 06/2015 with newly diagnosed AF with RVR in the setting of Escherichia coli urosepsis complicated by AKI. There was minimal troponin elevation felt to be related to demand ischemia. She was seen by cardiology. CHADS2-VASc=2. She converted to NSR with rate control therapy. Echo demonstrated normal LV function. As her arrhythmia occurred in the setting of acute illness and her current debilitated state was felt to be significant, anticoagulation was not recommended. Outpatient event monitor was arranged. This demonstrated recurrent AF with RVR with HR in the 160s.  CHADS2-VASc= 2. We placed her on Eliquis for anticoagulation. We increased her Cardizem for rate control. She continued to have paroxysms of rapid A. fib and we eventually placed her on flecainide 50 mg twice a day.  Follow-up ETT was negative for pro-arrhythmia.   She came to the ER on 5/1 with a-fib with RVR and was cardioverted. She has been followed in [place] in [place]. She received Zio patch monitor on 5/5 that she is still wearing. She developed shortness of breath and generalized weakness the last night. This is typical for her when she is in a-fib.    Past Medical History:  Diagnosis Date  . A-fib ([place])   . Anemia    "years ago"  . Atrial fibrillation with rapid ventricular response ([place]) 06/25/2015  . Carpal tunnel syndrome, bilateral   . Cervical spondylosis without myelopathy 10/25/2013   . Dental bridge present   . DJD (degenerative joint disease)   . Dysrhythmia    WENT INTO A FIB IN 2017   . Elevated troponin 02/18/2015  . Follicular lymphoma grade 3a ([place]) 02/03/2015  . History of echocardiogram    Echo 12/16: EF 60-65%, no RWMA, severe LAE  . History of nuclear stress test    Myoview 1/17: EF 55%, Normal study. No ischemia or scar.  . Hypothyroid   . Nausea without vomiting 06/20/2015  . Pneumonia   . Stage III chronic kidney disease ([place]) 07/01/2015  . Thrush of mouth and esophagus ([place]) 06/25/2015    Past Surgical History:  Procedure Laterality Date  . ABDOMINAL HYSTERECTOMY    . APPENDECTOMY    . BACK SURGERY     X5-lumbar-fusion  . COLONOSCOPY    . DILATION AND CURETTAGE OF UTERUS    . LYMPH NODE BIOPSY Right 01/21/2015   Procedure: RIGHT GROIN LYMPH NODE BIOPSY;  Surgeon: Erroll Luna, MD;  Location: [facility];  Service: General;  Laterality: Right;  . MASS EXCISION Left 08/29/2018   Procedure: EXCISION LEFT BACK  MASS;  Surgeon: Erroll Luna, MD;  Location: [facility];  Service: General;  Laterality: Left;  . Ovarian cyst resection    . patelar tendon transplants     Left/right  . PORTACATH PLACEMENT Right 02/13/2015   Procedure: INSERTION PORT-A-CATH WITH ULTRASOUND;  Surgeon: Erroll Luna, MD;  Location: [facility];  Service: General;  Laterality: Right;  . PORTACATH PLACEMENT N/A  08/29/2018   Procedure: INSERTION PORT-A-CATH WITH ULTRA SOUND ERAS PATHWAY;  Surgeon: Erroll Luna, MD;  Location: [facility];  Service: General;  Laterality: N/A;  . TOTAL KNEE ARTHROPLASTY  2011   Right  . TUBAL LIGATION       Home Medications:  Prior to Admission medications   Medication Sig Start Date End Date Taking? Authorizing Provider  acetaminophen (TYLENOL) 500 MG tablet Take 500 mg by mouth every 6 (six) hours as needed for moderate pain or headache.    [provider]  acyclovir (ZOVIRAX) 400 MG tablet Take 1 tablet (400 mg total) by mouth  daily. Patient not taking: Reported on 01/30/2019 09/04/18   Nicholas Lose, MD  apixaban (ELIQUIS) 5 MG TABS tablet Take 5 mg by mouth 2 (two) times daily.     [provider]  bisacodyl (DULCOLAX) 5 MG EC tablet Take 5 mg by mouth daily as needed for moderate constipation.    [provider]  cephALEXin (KEFLEX) 500 MG capsule Take 1 capsule (500 mg total) by mouth 3 (three) times daily. 01/30/19   Varney Biles, MD  diclofenac sodium (VOLTAREN) 1 % GEL Apply 2 g topically 3 (three) times daily as needed (knee pain). Patient taking differently: Apply 2 g topically 3 (three) times daily as needed (for back or knee pain).  08/01/18   Quintella Reichert, MD  furosemide (LASIX) 20 MG tablet Take 2 tablets (40 mg total) by mouth daily. 02/17/19   Varney Biles, MD  levothyroxine (SYNTHROID, LEVOTHROID) 112 MCG tablet Take 112 mcg by mouth daily.  04/22/15   [provider]  lidocaine-prilocaine (EMLA) cream Apply to affected area once Patient taking differently: Apply 1 application topically as needed (port access). Apply to affected area once 09/07/18   Nicholas Lose, MD  LORazepam (ATIVAN) 0.5 MG tablet Take 1 tablet (0.5 mg total) by mouth every 6 (six) hours as needed (Nausea or vomiting). 09/04/18   Nicholas Lose, MD  omeprazole (PRILOSEC) 40 MG capsule Take 1 capsule (40 mg total) by mouth daily. Patient not taking: Reported on 01/30/2019 09/19/18   [name], MD  ondansetron (ZOFRAN ODT) 8 MG disintegrating tablet Take 1 tablet (8 mg total) by mouth every 8 (eight) hours as needed for nausea. 01/30/19   Varney Biles, MD  ondansetron (ZOFRAN) 8 MG tablet Take 1 tablet (8 mg total) by mouth 2 (two) times daily as needed for refractory nausea / vomiting. Start on day 2 after bendamustine chemotherapy. Patient not taking: Reported on 02/16/2019 09/07/18   Nicholas Lose, MD  OVER THE COUNTER MEDICATION Take 1 packet by mouth daily. OTC Vegetable and fruit vitamin packet daily.     [provider]  polyvinyl alcohol (LIQUIFILM TEARS) 1.4 % ophthalmic solution Place 1 drop into both eyes as needed for dry eyes.    [provider]  potassium chloride SA (K-DUR) 20 MEQ tablet Take 1 tablet (20 mEq total) by mouth 2 (two) times daily. 02/17/19   Varney Biles, MD  prochlorperazine (COMPAZINE) 10 MG tablet Take 1 tablet (10 mg total) by mouth every 6 (six) hours as needed (Nausea or vomiting). 09/07/18   Nicholas Lose, MD    Inpatient Medications: Scheduled Meds:  Continuous Infusions: . diltiazem (CARDIZEM) infusion 5 mg/hr (02/25/19 1329)   PRN Meds:  Allergies:   No Known Allergies  Social History:   Social History   Socioeconomic History  . Marital status: Widowed    Spouse name: Not on file  . Number of  children: 2  . Years of education: hs  . Highest education level: Not on file  Occupational History  . Occupation: Retired  Scientific laboratory technician  . Financial resource strain: Not on file  . Food insecurity:    Worry: Not on file    Inability: Not on file  . Transportation needs:    Medical: Not on file    Non-medical: Not on file  Tobacco Use  . Smoking status: Never Smoker  . Smokeless tobacco: Never Used  Substance and Sexual Activity  . Alcohol use: Yes    Comment: occassional wine  . Drug use: No  . Sexual activity: Not on file  Lifestyle  . Physical activity:    Days per week: Not on file    Minutes per session: Not on file  . Stress: Not on file  Relationships  . Social connections:    Talks on phone: Not on file    Gets together: Not on file    Attends religious service: Not on file    Active member of club or organization: Not on file    Attends meetings of clubs or organizations: Not on file    Relationship status: Not on file  . Intimate partner violence:    Fear of current or ex partner: Not on file    Emotionally abused: Not on file    Physically abused: Not on file    Forced sexual activity: Not on file  Other  Topics Concern  . Not on file  Social History Narrative   Lives alone.  Has a walker for home use.    Family History:    Family History  Problem Relation Age of Onset  . Cancer Mother        Breast, lung NHL  . Cancer Sister        Multiple myeloma     ROS:  Please see the history of present illness.  All other ROS reviewed and negative.     Physical Exam/Data:   Vitals:   02/25/19 1345 02/25/19 1350 02/25/19 1400 02/25/19 1415  BP: (!) 141/74 118/80 (!) 129/94 (!) 143/78  Pulse: 82 (!) 57 (!) 55 91  Resp: 16   19  Temp:      SpO2: 97% 99% 96% 97%  Weight:      Height:       No intake or output data in the 24 hours ending 02/25/19 1433 Last 3 Weights 02/25/2019 02/16/2019 01/24/2019  Weight (lbs) 138 lb 141 lb 143 lb 12.8 oz  Weight (kg) 62.596 kg 63.957 kg 65.227 kg     Body mass index is 26.07 kg/m.  General:  Well nourished, well developed, in no acute distress HEENT: normal Lymph: no adenopathy Neck: no JVD Endocrine:  No thryomegaly Vascular: No carotid bruits; FA pulses 2+ bilaterally without bruits  Cardiac:  normal S1, S2; rapid iRRR; no murmur  Lungs: mild crackles to auscultation bilaterally, no wheezing, rhonchi or rales  Abd: soft, nontender, no hepatomegaly  Ext: minimal edema Musculoskeletal:  No deformities, BUE and BLE strength normal and equal Skin: warm and dry  Neuro:  CNs 2-12 intact, no focal abnormalities noted Psych:  Normal affect   EKG:  The EKG was personally reviewed and demonstrates:  A-fib, negative T waves in the lateral leads Telemetry:  Telemetry was personally reviewed and demonstrates:  A-fib with RVR  Relevant CV Studies:  TTE: 10/15/2015  - Left ventricle: The cavity size was normal. Systolic function was  normal. The estimated ejection fraction was in the range of 60%   to 65%. Wall motion was normal; there were no regional wall   motion abnormalities. The study is not technically sufficient to   allow evaluation of LV  diastolic function. - Left atrium: The atrium was severely dilated.  Impressions: - Limited study did not allow for assessment of diastolic function,   mitral, aortic of pulmonic valve function.  Laboratory Data:  Chemistry Recent Labs  Lab 02/25/19 1243  NA 139  K 4.3  CL 108  CO2 22  GLUCOSE 86  BUN 32*  CREATININE 1.16*  CALCIUM 8.9  GFRNONAA 46*  GFRAA 53*  ANIONGAP 9    No results for input(s): PROT, ALBUMIN, AST, ALT, ALKPHOS, BILITOT in the last 168 hours. Hematology Recent Labs  Lab 02/25/19 1243  WBC 5.9  RBC 3.95  HGB 12.8  HCT 39.3  MCV 99.5  MCH 32.4  MCHC 32.6  RDW 13.1  PLT 227   Cardiac EnzymesNo results for input(s): TROPONINI in the last 168 hours. No results for input(s): TROPIPOC in the last 168 hours.  BNP Recent Labs  Lab 02/25/19 1243  BNP 979.1*    DDimer No results for input(s): DDIMER in the last 168 hours.  Radiology/Studies:  Dg Chest Port 1 View  Result Date: 02/25/2019 CLINICAL DATA:  Chest pain, weakness EXAM: PORTABLE CHEST 1 VIEW COMPARISON:  02/16/2019 FINDINGS: Unchanged AP portable examination with cardiomegaly. No airspace opacity. Right chest port catheter. Left chest loop recorder. IMPRESSION: Unchanged AP portable examination with cardiomegaly. No airspace opacity. Right chest port catheter. Left chest loop recorder. Electronically Signed   By: Eddie Candle M.D.   On: 02/25/2019 13:09    Assessment and Plan:   This is a 78 y.o. female with a past medical history significant for mild left atrial dilatation, follicular lymphoma ( on R-CHOP), recent poor PO intake and diarrhea who presented to [facility] on [facility]15 w/ extreme weakness and was found to have atrial fibrillation with RVR, hypovolemia, AKI and electrolyte imbalances.  1. PAF with RVR              - CHADSVasc score 5  - on chronic eliquis  - started on cardizem drip, but BP down to 87 mmHg  - I will d/c cardizem and start amiodarone drip  - we can consider  starting flecainide again once she cardioverts  - she would like to follow with our clinic  2. Acute CHD  - sec to a-fib with RVR  - previously normal LVEF in 2016, we will order new echo  - start lasix 40 mg iv x 2 doses   For questions or updates, please contact [facility] Please consult [email or web address] for contact info under   Signed, Ena Dawley, MD  02/25/2019 2:33 PM

## 2019-02-25 NOTE — ED Notes (Signed)
Pt placed on 2L [place]

## 2019-02-25 NOTE — Progress Notes (Signed)
ANTICOAGULATION CONSULT NOTE - Initial Consult  Pharmacy Consult for Heparin Indication: atrial fibrillation  No Known Allergies  Patient Measurements: Height: 5\' 1"  (154.9 cm) Weight: 138 lb (62.6 kg) IBW/kg (Calculated) : 47.8 Heparin Dosing Weight: 60 kg  Vital Signs: Temp: 97.4 F (36.3 C) (05/10 1731) Temp Source: Axillary (05/10 1731) BP: 124/97 (05/10 1731) Pulse Rate: 95 (05/10 1731)  Labs: Recent Labs    02/25/19 1243  HGB 12.8  HCT 39.3  PLT 227  CREATININE 1.16*    Estimated Creatinine Clearance: 35 mL/min (A) (by C-G formula based on SCr of 1.16 mg/dL (H)).   Medical History: Past Medical History:  Diagnosis Date  . A-fib ([place])   . Anemia    "years ago"  . Atrial fibrillation with rapid ventricular response ([place]) 06/25/2015  . Carpal tunnel syndrome, bilateral   . Cervical spondylosis without myelopathy 10/25/2013  . Dental bridge present   . DJD (degenerative joint disease)   . Dysrhythmia    WENT INTO A FIB IN 2017   . Elevated troponin 02/18/2015  . Follicular lymphoma grade 3a ([place]) 02/03/2015  . History of echocardiogram    Echo 12/16: EF 60-65%, no RWMA, severe LAE  . History of nuclear stress test    Myoview 1/17: EF 55%, Normal study. No ischemia or scar.  . Hypothyroid   . Nausea without vomiting 06/20/2015  . Pneumonia   . Stage III chronic kidney disease ([place]) 07/01/2015  . Thrush of mouth and esophagus ([place]) 06/25/2015    Medications:  PTA Eliquis 5mg  BID - LD reported as taken 02/25/19 @ 0830 AM  Assessment:  78 yr female presented with shortness of breath.  PMH significant for CKD, hypothyroidism, folicular lymphoma, HTN and AFib with RVR (anticoagulation with Eliquis)  Eliquis held on admission and pharmacy consulted to dose IV heparin pending any potential procedures per cardiology  Goal of Therapy:  Heparin level 0.3-0.7 units/ml aPTT 66-102 seconds Monitor platelets by anticoagulation protocol: Yes   Plan:   Obtain baseline  aPTT and Heparin Level  At 21:00 (when next Eliquis dose would have been due): will begin IV heparin @ 900 units/hr  Will check aPTT 8 hr after IV heparin started (Eliquis can falsely elevate heparin level; therefore will monitor IV heparin with both aPTT and heparin levels until effects of Eliquis worn off and the two levels correlate)  Follow CBC and heparin level daily  Skyleen Bentley, Toribio Harbour, PharmD 02/25/2019,6:38 PM

## 2019-02-25 NOTE — H&P (Signed)
[facility] History and Physical  TALEYA WHITCHER KGY:[id number] DOB: 1941/03/09 DOA: 02/25/2019  Referring physician:  PCP: Reubin Milan, MD   Chief Complaint: Cordelia Pen   HPI: Carrie [name] is a 78 y.o. WF PMHx anxiety, CKD stage III, hypothyroidism, follicular lymphoma grade 3A,  S/P chemotherapy last treatment 9 April: Oncologist Dr. [name] cervical spondylosis without myelopathy, chronic pain syndrome (back), essential HTN, A. fib with RVR.  presents to the emergency room for evaluation of shortness of breath and generalized weakness. Patient has a history of atrial fibrillation. She had similar symptoms fatigue shortness of breath earlier this month. She was seen in the emergency room on May 1. She was found to be in rapid A. fib and underwent electric cardioversion. Patient states she followed up with a cardiologist at the [facility]. She has been wearing an outpatient monitor Zio Patch. Patient states this morning she started to feel fatigued and short of breath. She did not feel her heart racing. She denies any chest pain. No leg swelling.       Review of Systems:  Constitutional:  No weight loss, night sweats, Fevers, chills, fatigue.  HEENT:  No headaches, Difficulty swallowing,Tooth/dental problems,Sore throat,  No sneezing, itching, ear ache, nasal congestion, post nasal drip,  Cardio-vascular:  No chest pain, Orthopnea, PND, swelling in lower extremities, anasarca, dizziness, palpitations  GI:  No heartburn, indigestion, abdominal pain, nausea, vomiting, diarrhea, change in bowel habits, loss of appetite  Resp:  Positive shortness of breath with exertion or at rest. No excess mucus, no productive cough, No non-productive cough, No coughing up of blood.No change in color of mucus.No wheezing.No chest wall deformity  Skin:  no rash or lesions.  GU:  no dysuria, change in color of urine, no urgency or frequency. No flank pain.  Musculoskeletal:  No joint pain or  swelling. No decreased range of motion. No back pain.  Psych:  No change in mood or affect. No depression or anxiety. No memory loss.   Past Medical History:  Diagnosis Date  . A-fib ([place])   . Anemia    "years ago"  . Atrial fibrillation with rapid ventricular response ([place]) 06/25/2015  . Carpal tunnel syndrome, bilateral   . Cervical spondylosis without myelopathy 10/25/2013  . Dental bridge present   . DJD (degenerative joint disease)   . Dysrhythmia    WENT INTO A FIB IN 2017   . Elevated troponin 02/18/2015  . Follicular lymphoma grade 3a ([place]) 02/03/2015  . History of echocardiogram    Echo 12/16: EF 60-65%, no RWMA, severe LAE  . History of nuclear stress test    Myoview 1/17: EF 55%, Normal study. No ischemia or scar.  . Hypothyroid   . Nausea without vomiting 06/20/2015  . Pneumonia   . Stage III chronic kidney disease ([place]) 07/01/2015  . Thrush of mouth and esophagus ([place]) 06/25/2015   Past Surgical History:  Procedure Laterality Date  . ABDOMINAL HYSTERECTOMY    . APPENDECTOMY    . BACK SURGERY     X5-lumbar-fusion  . COLONOSCOPY    . DILATION AND CURETTAGE OF UTERUS    . LYMPH NODE BIOPSY Right 01/21/2015   Procedure: RIGHT GROIN LYMPH NODE BIOPSY;  Surgeon: Erroll Luna, MD;  Location: [place];  Service: General;  Laterality: Right;  . MASS EXCISION Left 08/29/2018   Procedure: EXCISION LEFT BACK  MASS;  Surgeon: Erroll Luna, MD;  Location: [place];  Service: General;  Laterality: Left;  .  Ovarian cyst resection    . patelar tendon transplants     Left/right  . PORTACATH PLACEMENT Right 02/13/2015   Procedure: INSERTION PORT-A-CATH WITH ULTRASOUND;  Surgeon: Erroll Luna, MD;  Location: [place];  Service: General;  Laterality: Right;  . PORTACATH PLACEMENT N/A 08/29/2018   Procedure: INSERTION PORT-A-CATH WITH ULTRA SOUND ERAS PATHWAY;  Surgeon: Erroll Luna, MD;  Location: [place];  Service: General;  Laterality: N/A;  . TOTAL KNEE ARTHROPLASTY   2011   Right  . TUBAL LIGATION     Social History:  reports that she has never smoked. She has never used smokeless tobacco. She reports current alcohol use. She reports that she does not use drugs.  No Known Allergies  Family History  Problem Relation Age of Onset  . Cancer Mother        Breast, lung NHL  . Cancer Sister        Multiple myeloma    Prior to Admission medications   Medication Sig Start Date End Date Taking? Authorizing Provider  acetaminophen (TYLENOL) 500 MG tablet Take 500 mg by mouth every 6 (six) hours as needed for moderate pain or headache.    [provider]  acyclovir (ZOVIRAX) 400 MG tablet Take 1 tablet (400 mg total) by mouth daily. Patient not taking: Reported on 01/30/2019 09/04/18   Nicholas Lose, MD  apixaban (ELIQUIS) 5 MG TABS tablet Take 5 mg by mouth 2 (two) times daily.     [provider]  bisacodyl (DULCOLAX) 5 MG EC tablet Take 5 mg by mouth daily as needed for moderate constipation.    [provider]  cephALEXin (KEFLEX) 500 MG capsule Take 1 capsule (500 mg total) by mouth 3 (three) times daily. 01/30/19   Varney Biles, MD  diclofenac sodium (VOLTAREN) 1 % GEL Apply 2 g topically 3 (three) times daily as needed (knee pain). Patient taking differently: Apply 2 g topically 3 (three) times daily as needed (for back or knee pain).  08/01/18   Quintella Reichert, MD  furosemide (LASIX) 20 MG tablet Take 2 tablets (40 mg total) by mouth daily. 02/17/19   Varney Biles, MD  levothyroxine (SYNTHROID, LEVOTHROID) 112 MCG tablet Take 112 mcg by mouth daily.  04/22/15   [provider]  lidocaine-prilocaine (EMLA) cream Apply to affected area once Patient taking differently: Apply 1 application topically as needed (port access). Apply to affected area once 09/07/18   Nicholas Lose, MD  LORazepam (ATIVAN) 0.5 MG tablet Take 1 tablet (0.5 mg total) by mouth every 6 (six) hours as needed (Nausea or vomiting). 09/04/18   Nicholas Lose, MD  omeprazole (PRILOSEC) 40 MG capsule Take 1 capsule (40 mg total) by mouth daily. Patient not taking: Reported on 01/30/2019 09/19/18   [name], MD  ondansetron (ZOFRAN ODT) 8 MG disintegrating tablet Take 1 tablet (8 mg total) by mouth every 8 (eight) hours as needed for nausea. 01/30/19   Varney Biles, MD  ondansetron (ZOFRAN) 8 MG tablet Take 1 tablet (8 mg total) by mouth 2 (two) times daily as needed for refractory nausea / vomiting. Start on day 2 after bendamustine chemotherapy. Patient not taking: Reported on 02/16/2019 09/07/18   Nicholas Lose, MD  OVER THE COUNTER MEDICATION Take 1 packet by mouth daily. OTC Vegetable and fruit vitamin packet daily.    [provider]  polyvinyl alcohol (LIQUIFILM TEARS) 1.4 % ophthalmic solution Place 1 drop into both eyes as needed for dry eyes.    [provider]  potassium chloride SA (K-DUR) 20 MEQ tablet Take 1 tablet (20 mEq total) by mouth 2 (two) times daily. 02/17/19   Varney Biles, MD  prochlorperazine (COMPAZINE) 10 MG tablet Take 1 tablet (10 mg total) by mouth every 6 (six) hours as needed (Nausea or vomiting). 09/07/18   Nicholas Lose, MD     Consultants:  Cardiology    Procedures/Significant Events:     I have personally reviewed and interpreted all radiology studies and my findings are as above.   VENTILATOR SETTINGS:    Cultures None  Antimicrobials: None   Devices None   LINES / TUBES:  None    Continuous Infusions: . diltiazem (CARDIZEM) infusion 5 mg/hr (02/25/19 1329)    Physical Exam: Vitals:   02/25/19 1345 02/25/19 1350 02/25/19 1400 02/25/19 1415  BP: (!) 141/74 118/80 (!) 129/94 (!) 143/78  Pulse: 82 (!) 57 (!) 55 91  Resp: 16   19  Temp:      SpO2: 97% 99% 96% 97%  Weight:      Height:        Wt Readings from Last 3 Encounters:  02/25/19 62.6 kg  02/16/19 64 kg  01/24/19 65.2 kg    General: A/O x4, no acute respiratory distress, cachectic Eyes:  negative scleral hemorrhage, negative anisocoria, negative icterus ENT: Negative Runny nose, negative gingival bleeding, Neck:  Negative scars, masses, torticollis, lymphadenopathy, JVD Lungs: Clear to auscultation bilaterally without wheezes or crackles Cardiovascular: Irregularly irregular rhythm and rate, without murmur gallop or rub normal S1 and S2, port right chest wall Abdomen: negative abdominal pain, nondistended, positive soft, bowel sounds, no rebound, no ascites, no appreciable mass Extremities: No significant cyanosis, clubbing, or edema bilateral lower extremities Skin: Negative rashes, lesions, ulcers Psychiatric:  Negative depression, negative anxiety, negative fatigue, negative mania  Central nervous system:  Cranial nerves II through XII intact, tongue/uvula midline, all extremities muscle strength 5/5, sensation intact throughout, negative dysarthria, negative expressive aphasia, negative receptive aphasia.        Labs on Admission:  Basic Metabolic Panel: Recent Labs  Lab 02/25/19 1243  NA 139  K 4.3  CL 108  CO2 22  GLUCOSE 86  BUN 32*  CREATININE 1.16*  CALCIUM 8.9  MG 2.3   Liver Function Tests: No results for input(s): AST, ALT, ALKPHOS, BILITOT, PROT, ALBUMIN in the last 168 hours. No results for input(s): LIPASE, AMYLASE in the last 168 hours. No results for input(s): AMMONIA in the last 168 hours. CBC: Recent Labs  Lab 02/25/19 1243  WBC 5.9  HGB 12.8  HCT 39.3  MCV 99.5  PLT 227   Cardiac Enzymes: No results for input(s): CKTOTAL, CKMB, CKMBINDEX, TROPONINI in the last 168 hours.  BNP (last 3 results) Recent Labs    01/30/19 0756 02/16/19 1226 02/25/19 1243  BNP 328.8* 1,207.1* 979.1*    ProBNP (last 3 results) No results for input(s): PROBNP in the last 8760 hours.  CBG: No results for input(s): GLUCAP in the last 168 hours.  Radiological Exams on Admission: Dg Chest Port 1 View  Result Date: 02/25/2019 CLINICAL DATA:  Chest  pain, weakness EXAM: PORTABLE CHEST 1 VIEW COMPARISON:  02/16/2019 FINDINGS: Unchanged AP portable examination with cardiomegaly. No airspace opacity. Right chest port catheter. Left chest loop recorder. IMPRESSION: Unchanged AP portable examination with cardiomegaly. No airspace opacity. Right chest port catheter. Left chest loop recorder. Electronically Signed   By: Eddie Candle M.D.   On: 02/25/2019 13:09  EKG: Independently reviewed.  A. fib with RVR  Assessment/Plan Active Problems:   Hypothyroidism   Grade 3a follicular lymphoma of lymph nodes of multiple regions (HCC)   Chronic kidney disease, stage III (moderate) (HCC)   Essential hypertension   Chronic atrial fibrillation   Chronic back pain   Acute back pain with sciatica, right   Generalized weakness   Insomnia disorder   Dyspnea on exertion   Anxiety disorder   Atrial fibrillation with RVR/CHF? - Patient currently in A. fib rate controlled although not perfectly.  Will not start another agent.  Will allow cardiology to manage. - Amiodarone drip per cardiology - Lasix 40 mg twice daily - Echocardiogram pending -Patient last received electrocardioversion on 5/1, cardioversion failure - Awaiting cardiology recommendations on if going to perform cardioversion: Electrocardioversion vs Ablation -We will discontinue Eliquis and place patient on heparin drip in case cardiology decides on ablation -Zofran PRN for nausea  Essential HTN -See A. fib  Anxiety DO -Part of patient's anxiety disorder may be her atrial fibrillation, patient cannot actually feel her heartbeat however becomes weak, nauseous when she goes into A. fib. - Ativan 0.5 mg QID PRN   Generalized weakness -Secondary to A. fib, see A. fib  Hypothyroidism - Previous admission on 5/1 TSH WNL - 112 mcg daily  CKD stage III (baseline 1.06-1.14) - Monitor creatinine closely given that patient may have to undergo cardiac catheterization - Will gently hydrate  normal saline 45m/hr   Chronic back pain/ -Diclofenac transdermal gel - Tramadol 50 mg QID PRN    Code Status: Full (DVT Prophylaxis: Heparin drip Family Communication: None Disposition Plan: Cardiology   Data Reviewed: Care during the described time interval was provided by me .  I have reviewed this patient's available data, including medical history, events of note, physical examination, and all test results as part of my evaluation.   The patient is critically ill with multiple organ systems failure and requires high complexity decision making for assessment and support, frequent evaluation and titration of therapies, application of advanced monitoring technologies and extensive interpretation of multiple databases. Critical Care Time devoted to patient care services described in this note  Time spent: 715minutes   , [facility] Pager [phone]

## 2019-02-26 ENCOUNTER — Other Ambulatory Visit: Payer: Self-pay | Admitting: Hematology and Oncology

## 2019-02-26 DIAGNOSIS — I5021 Acute systolic (congestive) heart failure: Secondary | ICD-10-CM

## 2019-02-26 LAB — BASIC METABOLIC PANEL
Anion gap: 11 (ref 5–15)
Anion gap: 11 (ref 5–15)
BUN: 31 mg/dL — ABNORMAL HIGH (ref 8–23)
BUN: 27 mg/dL — ABNORMAL HIGH (ref 8–23)
CO2: 23 mmol/L (ref 22–32)
CO2: 24 mmol/L (ref 22–32)
Calcium: 8.4 mg/dL — ABNORMAL LOW (ref 8.9–10.3)
Calcium: 8.4 mg/dL — ABNORMAL LOW (ref 8.9–10.3)
Chloride: 106 mmol/L (ref 98–111)
Chloride: 103 mmol/L (ref 98–111)
Creatinine, Ser: 1.29 mg/dL — ABNORMAL HIGH (ref 0.44–1.00)
Creatinine, Ser: 1.28 mg/dL — ABNORMAL HIGH (ref 0.44–1.00)
GFR calc Af Amer: 47 mL/min — ABNORMAL LOW (ref >60)
GFR calc Af Amer: 47 mL/min — ABNORMAL LOW (ref >60)
GFR calc non Af Amer: 40 mL/min — ABNORMAL LOW (ref >60)
GFR calc non Af Amer: 41 mL/min — ABNORMAL LOW (ref >60)
Glucose, Bld: 123 mg/dL — ABNORMAL HIGH (ref 70–99)
Glucose, Bld: 92 mg/dL (ref 70–99)
Potassium: 3.7 mmol/L (ref 3.5–5.1)
Potassium: 3.3 mmol/L — ABNORMAL LOW (ref 3.5–5.1)
Sodium: 140 mmol/L (ref 135–145)
Sodium: 138 mmol/L (ref 135–145)

## 2019-02-26 LAB — CBC
HCT: 36.7 % (ref 36.0–46.0)
Hemoglobin: 12.1 g/dL (ref 12.0–15.0)
MCH: 32.7 pg (ref 26.0–34.0)
MCHC: 33 g/dL (ref 30.0–36.0)
MCV: 99.2 fL (ref 80.0–100.0)
Platelets: 206 10*3/uL (ref 150–400)
RBC: 3.7 MIL/uL — ABNORMAL LOW (ref 3.87–5.11)
RDW: 13.1 % (ref 11.5–15.5)
WBC: 5.4 10*3/uL (ref 4.0–10.5)
nRBC: 0 % (ref 0.0–0.2)

## 2019-02-26 LAB — HEPARIN LEVEL (UNFRACTIONATED)
Heparin Unfractionated: 1.7 IU/mL — ABNORMAL HIGH (ref 0.30–0.70)
Heparin Unfractionated: 1.26 IU/mL — ABNORMAL HIGH (ref 0.30–0.70)

## 2019-02-26 LAB — MAGNESIUM: Magnesium: 1.9 mg/dL (ref 1.7–2.4)

## 2019-02-26 LAB — APTT: aPTT: 78 seconds — ABNORMAL HIGH (ref 24–36)

## 2019-02-26 MED ORDER — CARVEDILOL 3.125 MG PO TABS
3.1250 mg | ORAL_TABLET | Freq: Two times a day (BID) | ORAL | Status: DC
  Administered 2019-02-26 – 2019-02-28 (×4): 3.125 mg via ORAL
  Filled 2019-02-26 (×4): qty 1

## 2019-02-26 MED ORDER — SACUBITRIL-VALSARTAN 24-26 MG PO TABS
1.0000 | ORAL_TABLET | Freq: Two times a day (BID) | ORAL | Status: DC
  Administered 2019-02-26 – 2019-02-28 (×5): 1 via ORAL
  Filled 2019-02-26 (×5): qty 1

## 2019-02-26 MED ORDER — APIXABAN 5 MG PO TABS
5.0000 mg | ORAL_TABLET | Freq: Two times a day (BID) | ORAL | Status: DC
  Administered 2019-02-26 – 2019-02-28 (×5): 5 mg via ORAL
  Filled 2019-02-26 (×5): qty 1

## 2019-02-26 MED ORDER — FUROSEMIDE 20 MG PO TABS
10.0000 mg | ORAL_TABLET | Freq: Every day | ORAL | Status: DC
  Administered 2019-02-27 – 2019-02-28 (×2): 10 mg via ORAL
  Filled 2019-02-26 (×2): qty 1

## 2019-02-26 MED ORDER — POTASSIUM CHLORIDE CRYS ER 20 MEQ PO TBCR
40.0000 meq | EXTENDED_RELEASE_TABLET | Freq: Once | ORAL | Status: AC
  Administered 2019-02-26: 40 meq via ORAL
  Filled 2019-02-26: qty 2

## 2019-02-26 NOTE — Progress Notes (Signed)
Progress Note  Patient Name: Carrie [name] Date of Encounter: 02/26/2019  Primary Cardiologist: No primary care provider on file.   Subjective   No complaints   Inpatient Medications    Scheduled Meds:  aspirin EC  81 mg Oral Daily   levothyroxine  112 mcg Oral Q0600   Continuous Infusions:  amiodarone 30 mg/hr (02/26/19 0127)   heparin 900 Units/hr (02/25/19 2330)   PRN Meds: LORazepam **OR** LORazepam, sodium chloride flush, traMADol   Vital Signs    Vitals:   02/25/19 1645 02/25/19 1731 02/25/19 2112 02/26/19 0430  BP: (!) 126/98 (!) 124/97 (!) 139/91 121/86  Pulse: (!) 51 95 (!) 56 72  Resp: (!) 21 20 18 14   Temp:  (!) 97.4 F (36.3 C) (!) 97.2 F (36.2 C) 98.2 F (36.8 C)  TempSrc:  Axillary Oral Oral  SpO2: 100% 97% 99% 95%  Weight:    62.2 kg  Height:        Intake/Output Summary (Last 24 hours) at 02/26/2019 0810 Last data filed at 02/26/2019 0754 Gross per 24 hour  Intake 1583.99 ml  Output 300 ml  Net 1283.99 ml   Filed Weights   02/25/19 1225 02/26/19 0430  Weight: 62.6 kg 62.2 kg    Telemetry    NSR  - Personally Reviewed  Physical Exam    BP 121/86 (BP Location: Right Arm)    Pulse 72    Temp 98.2 F (36.8 C) (Oral)    Resp 14    Ht 5\' 1"  (1.549 m)    Wt 62.2 kg    SpO2 95%    BMI 25.92 kg/m  Port a cath right subclavian Lungs clear S1/S2 MR murmur  Abdomen soft  No edema   Labs    Chemistry Recent Labs  Lab 02/25/19 1243 02/26/19 0403  NA 139 140  K 4.3 3.7  CL 108 106  CO2 22 23  GLUCOSE 86 123*  BUN 32* 31*  CREATININE 1.16* 1.29*  CALCIUM 8.9 8.4*  GFRNONAA 46* 40*  GFRAA 53* 47*  ANIONGAP 9 11     Hematology Recent Labs  Lab 02/25/19 1243 02/26/19 0403  WBC 5.9 5.4  RBC 3.95 3.70*  HGB 12.8 12.1  HCT 39.3 36.7  MCV 99.5 99.2  MCH 32.4 32.7  MCHC 32.6 33.0  RDW 13.1 13.1  PLT 227 206    Cardiac EnzymesNo results for input(s): TROPONINI in the last 168 hours. No results for input(s):  TROPIPOC in the last 168 hours.   BNP Recent Labs  Lab 02/25/19 1243  BNP 979.1*     DDimer No results for input(s): DDIMER in the last 168 hours.   Radiology    Dg Chest Port 1 View  Result Date: 02/25/2019 CLINICAL DATA:  Chest pain, weakness EXAM: PORTABLE CHEST 1 VIEW COMPARISON:  02/16/2019 FINDINGS: Unchanged AP portable examination with cardiomegaly. No airspace opacity. Right chest port catheter. Left chest loop recorder. IMPRESSION: Unchanged AP portable examination with cardiomegaly. No airspace opacity. Right chest port catheter. Left chest loop recorder. Electronically Signed   By: Eddie Candle M.D.   On: 02/25/2019 13:09    Cardiac Studies   Echocardiogram 02/25/2019: IMPRESSIONS    1. The left ventricle has severely reduced systolic function, with an ejection fraction of 20-25%. The cavity size was normal. Left ventricular diastolic function could not be evaluated secondary to atrial fibrillation. Left ventricular diffuse  hypokinesis.  2. The right ventricle has severely reduced systolic function. The  cavity was mildly enlarged. There is no increase in right ventricular wall thickness. Right ventricular systolic pressure is mildly elevated with an estimated pressure of 23.5 mmHg.  3. Left atrial size was mildly dilated.  4. Right atrial size was mildly dilated.  5. Trivial pericardial effusion is present.  6. The mitral valve is grossly normal. Mild thickening of the mitral valve leaflet. Mitral valve regurgitation is moderate by color flow Doppler.  7. The aortic valve is tricuspid. Aortic valve regurgitation is trivial by color flow Doppler. No stenosis of the aortic valve.  SUMMARY   When compared to the prior study from 2016 there is a significant change, LVEF has decreased from 50-55% to 15-20% with diffuse hypokinesis. RVEF is severely decreased.  Patient Profile     78 y.o. female with a PMH of HTN, PAF (s/p recent cardioversion 01/25/4495), follicular  lymphoma s/p chemo (last tx 01/25/2019), hypothyroidism, CKD stage 3, who presented with SOB and weakness, found to have atrial fibrillation with RVR TTE done 5/10 with newly discovered cardiomyopathy EF 20-25%  Assessment & Plan    1. Acute combined CHF: presented with SOB and weakness. BNP 979. CXR without overt edema. Felt to be mildly volume overloaded on consult yesterday and given IV lasix 40mg  x2 doses - doubt I&Os documented completely. Echo 02/25/2019 showed EF 20-25% (previously 50-55% in 2016) with diffuse hypokinesis, severely reduced RV systolic function, and moderate MR. TSH wnl 02/16/2019. Possible this is tachycardia mediated  Vs chemoRx vs HTN mediated, though cannot exclude ischemic heart disease. Last ischemic evaluation was a NST in 2017 which was negative for ischemia, EF 55%.  - Diuresis continue low dose lasix Cr / K stable  - Start carvedilol 3.125 bid  - Cr 1.29 today - BP better will try lowest dose of entresto and see how she responds   Have sent not to Dr Alvy Bimler regarding low EF as her chemoRx will have to be altered and no more rituximab.   2. Paroxysmal atrial fibrillation: patient presented with SOB and weakness. Found to be in Afib RVR on admission with rates int he 130s. Started on a diltiazem gtt initially c/b hypotension, therefore transitioned to an amiodarone gtt. NSR this am  Currently wearing a zio patch monitor. - Continue IV amiodarone vs transition to po  400 bid in am  - Will start carvedilol for rate control and management of #1.  - On a heparin gtt for stroke ppx  Change to oral eliquis   3. HTN: BP up a bit. Not on any antihypertensives prior to admission. - normal follow for hypotension with starting entresto  4. Acute on chronic renal insufficiency stage 3: Cr 1.29 today; baseline ~1.  - Continue to monitor closely with diuresis.    Jenkins Rouge

## 2019-02-26 NOTE — Progress Notes (Signed)
[place] for Heparin >> Eliquis Indication: atrial fibrillation  No Known Allergies  Patient Measurements: Height: 5\' 1"  (154.9 cm) Weight: 137 lb 3.2 oz (62.2 kg) IBW/kg (Calculated) : 47.8  Vital Signs: Temp: 98.2 F (36.8 C) (05/11 0430) Temp Source: Oral (05/11 0430) BP: 121/86 (05/11 0430) Pulse Rate: 72 (05/11 0430)  Labs: Recent Labs    02/25/19 1243 02/25/19 2211 02/26/19 0403  HGB 12.8  --  12.1  HCT 39.3  --  36.7  PLT 227  --  206  APTT  --  33 78*  HEPARINUNFRC  --  1.70* 1.26*  CREATININE 1.16*  --  1.29*    Estimated Creatinine Clearance: 31.4 mL/min (A) (by C-G formula based on SCr of 1.29 mg/dL (H)).   Medications:  Infusions:  . amiodarone 30 mg/hr (02/26/19 0127)    Medications:  PTA Eliquis 5mg  BID - LD reported as taken 02/25/19 @ 0830 AM  Assessment:  78 yr female presented with shortness of breath.  PMH significant for CKD, hypothyroidism, folicular lymphoma, HTN and AFib with RVR (anticoagulation with Eliquis)  Eliquis held on admission and pharmacy consulted to dose IV heparin pending any potential procedures per cardiology  Today, 02/26/2019:  CBC WNL, stable  SCr 1.29, Wt 62 kg, Age 78 yr  Most recent aPTT therapeutic on heparin infusion  Pharmacy consulted to transition back to Eliquis today    Plan:   Heparin already stopped by MD  Resume Eliquis 5 mg PO bid  Carrie [name] A 02/26/2019,11:01 AM

## 2019-02-26 NOTE — Progress Notes (Addendum)
ANTICOAGULATION CONSULT NOTE - Follow Up Consult  Pharmacy Consult for Heparin Indication: atrial fibrillation  No Known Allergies  Patient Measurements: Height: 5\' 1"  (154.9 cm) Weight: 137 lb 3.2 oz (62.2 kg) IBW/kg (Calculated) : 47.8 Heparin Dosing Weight:   Vital Signs: Temp: 98.2 F (36.8 C) (05/11 0430) Temp Source: Oral (05/11 0430) BP: 121/86 (05/11 0430) Pulse Rate: 72 (05/11 0430)  Labs: Recent Labs    02/25/19 1243 02/25/19 2211 02/26/19 0403  HGB 12.8  --  12.1  HCT 39.3  --  36.7  PLT 227  --  206  APTT  --  33 78*  HEPARINUNFRC  --  1.70*  --   CREATININE 1.16*  --  1.29*    Estimated Creatinine Clearance: 31.4 mL/min (A) (by C-G formula based on SCr of 1.29 mg/dL (H)).   Medications:  Infusions:  . sodium chloride 75 mL/hr at 02/26/19 0322  . amiodarone 30 mg/hr (02/26/19 0127)  . heparin 900 Units/hr (02/25/19 2330)    Assessment: Patient with PTT at goal.  Heparin level high.  Will only act on PTT.  No heparin issues noted.  PTT ordered with Heparin level until both correlate due to possible drug-lab interaction between oral anticoagulant (rivaroxaban, edoxaban, or apixaban) and anti-Xa level (aka heparin level)   Goal of Therapy:  Heparin level 0.3-0.7 units/ml aPTT 66-102 seconds Monitor platelets by anticoagulation protocol: Yes   Plan:  Continue heparin drip at current rate Recheck level PTT/HL at [address], [place] 02/26/2019,5:01 AM

## 2019-02-26 NOTE — Progress Notes (Signed)
PROGRESS NOTE    Carrie [name]  RWE:[id number] DOB: 31-May-1941 DOA: 02/25/2019 PCP: Reubin Milan, MD   Brief Narrative: Patient is a 78 year old female with history of anxiety, A. fib, CKD stage III, hypothyroidism, follicular lymphoma grade 3A, hypertension who presents to the emergency department with complaints of shortness of breath and generalized weakness.  Patient was found to be in A. fib with RVR.  Started on Cardizem drip.  Cardiology consulted.  Echocardiogram showed new onset systolic CHF.  Assessment & Plan:   Active Problems:   Hypothyroidism   Grade 3a follicular lymphoma of lymph nodes of multiple regions (HCC)   Chronic kidney disease, stage III (moderate) (HCC)   Atrial fibrillation with rapid ventricular response (HCC)   Essential hypertension   Chronic atrial fibrillation   Chronic back pain   Acute back pain with sciatica, right   Generalized weakness   Insomnia disorder   Dyspnea on exertion   Anxiety disorder   Atrial fibrillation with RVR (HCC)   Acute systolic congestive heart failure (HCC)  A. fib with RVR: Currently heart rate controlled.  Became hypotensive with Cardizem drip.  Started on amiodarone drip. This morning heart rate was well controlled. She was in NSR. Cardiology following.  Currently on heparin drip.  She is on Eliquis at home.CHADSVasc score 5  Acute systolic congestive heart failure: Most likely secondary to A. fib with RVR.  Previous echocardiogram showed normal left ventricular ejection fraction of 60 to 65% in 2016.  She has been given 2 doses of IV Lasix.  She does not have any signs and symptoms of heart failure. She has been started on carvedilol and Entresto.  Hypertension: Currently blood pressure stable.  Continue to monitor  Anxiety: Continue Ativan as needed  Hypothyroidism: Continue Synthyroid  CKD stage III: Baseline creatinine ranges from 1-1.14.  Currently kidney function at baseline.  Chronic back pain: Continue  supportive care.  Pain management  Follicular lymphoma: Her last chemotherapy was in April.  Follows with Dr. Alvy Bimler.         DVT prophylaxis: Heparin Code Status: Full Family Communication: None present at the bedside Disposition Plan: Home after cardiology clearance   Consultants: Cardiology  Procedures: Echocardiogram  Antimicrobials:  Anti-infectives (From admission, onward)   None      Subjective: Patient seen and examined the bedside this morning.  Remains comfortable.  Hemodynamically stable.  Her heart rate is well controlled at present.  She converted to normal sinus rhythm.  Denies any shortness of breath or chest pain.  Objective: Vitals:   02/25/19 1645 02/25/19 1731 02/25/19 2112 02/26/19 0430  BP: (!) 126/98 (!) 124/97 (!) 139/91 121/86  Pulse: (!) 51 95 (!) 56 72  Resp: (!) 21 20 18 14   Temp:  (!) 97.4 F (36.3 C) (!) 97.2 F (36.2 C) 98.2 F (36.8 C)  TempSrc:  Axillary Oral Oral  SpO2: 100% 97% 99% 95%  Weight:    62.2 kg  Height:        Intake/Output Summary (Last 24 hours) at 02/26/2019 1034 Last data filed at 02/26/2019 1033 Gross per 24 hour  Intake 1864.19 ml  Output 750 ml  Net 1114.19 ml   Filed Weights   02/25/19 1225 02/26/19 0430  Weight: 62.6 kg 62.2 kg    Examination:  General exam: Appears calm and comfortable ,Not in distress,thin built elderly female  HEENT:PERRL,Oral mucosa moist, Ear/Nose normal on gross exam Respiratory system: Bilateral equal air entry, normal vesicular breath sounds, no wheezes  or crackles  Cardiovascular system: S1 & S2 heard, RRR. No JVD, murmurs, rubs, gallops or clicks. Trace pedal edema.Chemo port on the right chest Gastrointestinal system: Abdomen is nondistended, soft and nontender. No organomegaly or masses felt. Normal bowel sounds heard. Central nervous system: Alert and oriented. No focal neurological deficits. Extremities: Trace edema, no clubbing ,no cyanosis, distal peripheral pulses  palpable. Skin: No rashes, lesions or ulcers,no icterus ,no pallor MSK: Normal muscle bulk,tone ,power Psychiatry: Judgement and insight appear normal. Mood & affect appropriate.     Data Reviewed: I have personally reviewed following labs and imaging studies  CBC: Recent Labs  Lab 02/25/19 1243 02/26/19 0403  WBC 5.9 5.4  HGB 12.8 12.1  HCT 39.3 36.7  MCV 99.5 99.2  PLT 227 767   Basic Metabolic Panel: Recent Labs  Lab 02/25/19 1243 02/26/19 0403  NA 139 140  K 4.3 3.7  CL 108 106  CO2 22 23  GLUCOSE 86 123*  BUN 32* 31*  CREATININE 1.16* 1.29*  CALCIUM 8.9 8.4*  MG 2.3 1.9   GFR: Estimated Creatinine Clearance: 31.4 mL/min (A) (by C-G formula based on SCr of 1.29 mg/dL (H)). Liver Function Tests: No results for input(s): AST, ALT, ALKPHOS, BILITOT, PROT, ALBUMIN in the last 168 hours. No results for input(s): LIPASE, AMYLASE in the last 168 hours. No results for input(s): AMMONIA in the last 168 hours. Coagulation Profile: No results for input(s): INR, PROTIME in the last 168 hours. Cardiac Enzymes: No results for input(s): CKTOTAL, CKMB, CKMBINDEX, TROPONINI in the last 168 hours. BNP (last 3 results) No results for input(s): PROBNP in the last 8760 hours. HbA1C: No results for input(s): HGBA1C in the last 72 hours. CBG: No results for input(s): GLUCAP in the last 168 hours. Lipid Profile: No results for input(s): CHOL, HDL, LDLCALC, TRIG, CHOLHDL, LDLDIRECT in the last 72 hours. Thyroid Function Tests: No results for input(s): TSH, T4TOTAL, FREET4, T3FREE, THYROIDAB in the last 72 hours. Anemia Panel: No results for input(s): VITAMINB12, FOLATE, FERRITIN, TIBC, IRON, RETICCTPCT in the last 72 hours. Sepsis Labs: No results for input(s): PROCALCITON, LATICACIDVEN in the last 168 hours.  Recent Results (from the past 240 hour(s))  SARS Coronavirus 2 (CEPHEID - Performed in [facility] lab), Hosp Order     Status: None   Collection Time:  02/25/19  2:30 PM  Result Value Ref Range Status   SARS Coronavirus 2 NEGATIVE NEGATIVE Final    Comment: (NOTE) If result is NEGATIVE SARS-CoV-2 target nucleic acids are NOT DETECTED. The SARS-CoV-2 RNA is generally detectable in upper and lower  respiratory specimens during the acute phase of infection. The lowest  concentration of SARS-CoV-2 viral copies this assay can detect is 250  copies / mL. A negative result does not preclude SARS-CoV-2 infection  and should not be used as the sole basis for treatment or other  patient management decisions.  A negative result may occur with  improper specimen collection / handling, submission of specimen other  than nasopharyngeal swab, presence of viral mutation(s) within the  areas targeted by this assay, and inadequate number of viral copies  (<250 copies / mL). A negative result must be combined with clinical  observations, patient history, and epidemiological information. If result is POSITIVE SARS-CoV-2 target nucleic acids are DETECTED. The SARS-CoV-2 RNA is generally detectable in upper and lower  respiratory specimens dur ing the acute phase of infection.  Positive  results are indicative of active infection with SARS-CoV-2.  Clinical  correlation with patient history and other diagnostic information is  necessary to determine patient infection status.  Positive results do  not rule out bacterial infection or co-infection with other viruses. If result is PRESUMPTIVE POSTIVE SARS-CoV-2 nucleic acids MAY BE PRESENT.   A presumptive positive result was obtained on the submitted specimen  and confirmed on repeat testing.  While 2019 novel coronavirus  (SARS-CoV-2) nucleic acids may be present in the submitted sample  additional confirmatory testing may be necessary for epidemiological  and / or clinical management purposes  to differentiate between  SARS-CoV-2 and other Sarbecovirus currently known to infect humans.  If clinically  indicated additional testing with an alternate test  methodology [phone]) is advised. The SARS-CoV-2 RNA is generally  detectable in upper and lower respiratory sp ecimens during the acute  phase of infection. The expected result is Negative. Fact Sheet for Patients:  StrictlyIdeas.no Fact Sheet for Healthcare Providers: BankingDealers.co.za This test is not yet approved or cleared by the [place] FDA and has been authorized for detection and/or diagnosis of SARS-CoV-2 by FDA under an Emergency Use Authorization (EUA).  This EUA will remain in effect (meaning this test can be used) for the duration of the COVID-19 declaration under Section 564(b)(1) of the Act, 21 U.S.C. section 360bbb-3(b)(1), unless the authorization is terminated or revoked sooner. Performed at [facility], [place] [address] [place], [place] [address]          Radiology Studies: Dg Chest Port 1 View  Result Date: 02/25/2019 CLINICAL DATA:  Chest pain, weakness EXAM: PORTABLE CHEST 1 VIEW COMPARISON:  02/16/2019 FINDINGS: Unchanged AP portable examination with cardiomegaly. No airspace opacity. Right chest port catheter. Left chest loop recorder. IMPRESSION: Unchanged AP portable examination with cardiomegaly. No airspace opacity. Right chest port catheter. Left chest loop recorder. Electronically Signed   By: Eddie Candle M.D.   On: 02/25/2019 13:09        Scheduled Meds: . aspirin EC  81 mg Oral Daily  . carvedilol  3.125 mg Oral BID WC  . furosemide  10 mg Oral Daily  . levothyroxine  112 mcg Oral Q0600  . sacubitril-valsartan  1 tablet Oral BID   Continuous Infusions: . amiodarone 30 mg/hr (02/26/19 0127)     LOS: 1 day    Time spent: 35 mins.More than 50% of that time was spent in counseling and/or coordination of care.      Shelly Coss, MD [facility] Pager [phone]  If 7PM-7AM, please contact  night-coverage [email or web address] Password [facility] 02/26/2019, 10:34 AM

## 2019-02-26 NOTE — Discharge Instructions (Signed)
Sacubitril; Valsartan oral tablet What is this medicine? SACUBITRIL; VALSARTAN (sak UE bi tril; val SAR tan) is a combination of 2 drugs used to reduce the risk of death and hospitalizations in people with long-lasting heart failure. It is usually used with other medicines to treat heart failure. This medicine may be used for other purposes; ask your health care provider or pharmacist if you have questions. COMMON BRAND NAME(S): Entresto What should I tell my health care provider before I take this medicine? They need to know if you have any of these conditions: -diabetes and take a medicine that contains aliskiren -kidney disease -liver disease -an unusual or allergic reaction to sacubitril; valsartan, drugs called angiotensin converting enzyme (ACE) inhibitors, angiotensin II receptor blockers (ARBs), other medicines, foods, dyes, or preservatives -pregnant or trying to get pregnant -breast-feeding How should I use this medicine? Take this medicine by mouth with a glass of water. Follow the directions on the prescription label. You can take it with or without food. If it upsets your stomach, take it with food. Take your medicine at regular intervals. Do not take it more often than directed. Do not stop taking except on your doctor's advice. Do not take this medicine for at least 36 hours before or after you take an ACE inhibitor medicine. Talk to your health care provider if you are not sure if you take an ACE inhibitor. Talk to your pediatrician regarding the use of this medicine in children. Special care may be needed. Overdosage: If you think you have taken too much of this medicine contact a poison control center or emergency room at once. NOTE: This medicine is only for you. Do not share this medicine with others. What if I miss a dose? If you miss a dose, take it as soon as you can. If it is almost time for next dose, take only that dose. Do not take double or extra doses. What may  interact with this medicine? Do not take this medicine with any of the following medicines: -aliskiren if you have diabetes -angiotensin-converting enzyme (ACE) inhibitors, like benazepril, captopril, enalapril, fosinopril, lisinopril, or ramipril This medicine may also interact with the following medicines: -angiotensin II receptor blockers (ARBs) like azilsartan, candesartan, eprosartan, irbesartan, losartan, olmesartan, telmisartan, or valsartan -lithium -NSAIDS, medicines for pain and inflammation, like ibuprofen or naproxen -potassium-sparing diuretics like amiloride, spironolactone, and triamterene -potassium supplements This list may not describe all possible interactions. Give your health care provider a list of all the medicines, herbs, non-prescription drugs, or dietary supplements you use. Also tell them if you smoke, drink alcohol, or use illegal drugs. Some items may interact with your medicine. What should I watch for while using this medicine? Tell your doctor or healthcare professional if your symptoms do not start to get better or if they get worse. Do not become pregnant while taking this medicine. Women should inform their doctor if they wish to become pregnant or think they might be pregnant. There is a potential for serious side effects to an unborn child. Talk to your health care professional or pharmacist for more information. You may get dizzy. Do not drive, use machinery, or do anything that needs mental alertness until you know how this medicine affects you. Do not stand or sit up quickly, especially if you are an older patient. This reduces the risk of dizzy or fainting spells. Avoid alcoholic drinks; they can make you more dizzy. What side effects may I notice from receiving this medicine? Side effects  that you should report to your doctor or health care professional as soon as possible: -allergic reactions like skin rash, itching or hives, swelling of the face, lips, or  tongue -signs and symptoms of increased potassium like muscle weakness; chest pain; or fast, irregular heartbeat -signs and symptoms of kidney injury like trouble passing urine or change in the amount of urine -signs and symptoms of low blood pressure like feeling dizzy or lightheaded, or if you develop extreme fatigue Side effects that usually do not require medical attention (report to your doctor or health care professional if they continue or are bothersome): -cough This list may not describe all possible side effects. Call your doctor for medical advice about side effects. You may report side effects to FDA at 1-800-FDA-1088. Where should I keep my medicine? Keep out of the reach of children. Store at room temperature between 15 and 30 degrees C (59 and 86 degrees F). Throw away any unused medicine after the expiration date. NOTE: This sheet is a summary. It may not cover all possible information. If you have questions about this medicine, talk to your doctor, pharmacist, or health care provider.  2019 Elsevier/Gold Standard (2015-11-19 13:54:19)  Carvedilol tablets What is this medicine? CARVEDILOL (KAR ve dil ol) is a beta-blocker. Beta-blockers reduce the workload on the heart and help it to beat more regularly. This medicine is used to treat high blood pressure and heart failure. This medicine may be used for other purposes; ask your health care provider or pharmacist if you have questions. COMMON BRAND NAME(S): Coreg What should I tell my health care provider before I take this medicine? They need to know if you have any of these conditions: -circulation problems -diabetes -history of heart attack or heart disease -liver disease -lung or breathing disease, like asthma or emphysema -pheochromocytoma -slow or irregular heartbeat -thyroid disease -an unusual or allergic reaction to carvedilol, other beta-blockers, medicines, foods, dyes, or preservatives -pregnant or trying to  get pregnant -breast-feeding How should I use this medicine? Take this medicine by mouth with a glass of water. Follow the directions on the prescription label. It is best to take the tablets with food. Take your doses at regular intervals. Do not take your medicine more often than directed. Do not stop taking except on the advice of your doctor or health care professional. Talk to your pediatrician regarding the use of this medicine in children. Special care may be needed. Overdosage: If you think you have taken too much of this medicine contact a poison control center or emergency room at once. NOTE: This medicine is only for you. Do not share this medicine with others. What if I miss a dose? If you miss a dose, take it as soon as you can. If it is almost time for your next dose, take only that dose. Do not take double or extra doses. What may interact with this medicine? This medicine may interact with the following medications: -certain medicines for blood pressure, heart disease, irregular heart beat -certain medicines for depression, like fluoxetine or paroxetine -certain medicines for diabetes, like glipizide or glyburide -cimetidine -clonidine -cyclosporine -digoxin -MAOIs like Carbex, Eldepryl, Marplan, Nardil, and Parnate -reserpine -rifampin This list may not describe all possible interactions. Give your health care provider a list of all the medicines, herbs, non-prescription drugs, or dietary supplements you use. Also tell them if you smoke, drink alcohol, or use illegal drugs. Some items may interact with your medicine. What should I watch for while  using this medicine? Check your heart rate and blood pressure regularly while you are taking this medicine. Ask your doctor or health care professional what your heart rate and blood pressure should be, and when you should contact him or her. Do not stop taking this medicine suddenly. This could lead to serious heart-related  effects. Contact your doctor or health care professional if you have difficulty breathing while taking this drug. Check your weight daily. Ask your doctor or health care professional when you should notify him/her of any weight gain. You may get drowsy or dizzy. Do not drive, use machinery, or do anything that requires mental alertness until you know how this medicine affects you. To reduce the risk of dizzy or fainting spells, do not sit or stand up quickly. Alcohol can make you more drowsy, and increase flushing and rapid heartbeats. Avoid alcoholic drinks. If you have diabetes, check your blood sugar as directed. Tell your doctor if you have changes in your blood sugar while you are taking this medicine. If you are going to have surgery, tell your doctor or health care professional that you are taking this medicine. What side effects may I notice from receiving this medicine? Side effects that you should report to your doctor or health care professional as soon as possible: -allergic reactions like skin rash, itching or hives, swelling of the face, lips, or tongue -breathing problems -dark urine -irregular heartbeat -swollen legs or ankles -vomiting -yellowing of the eyes or skin Side effects that usually do not require medical attention (report to your doctor or health care professional if they continue or are bothersome): -change in sex drive or performance -diarrhea -dry eyes (especially if wearing contact lenses) -dry, itching skin -headache -nausea -unusually tired This list may not describe all possible side effects. Call your doctor for medical advice about side effects. You may report side effects to FDA at 1-800-FDA-1088. Where should I keep my medicine? Keep out of the reach of children. Store at room temperature below 30 degrees C (86 degrees F). Protect from moisture. Keep container tightly closed. Throw away any unused medicine after the expiration date. NOTE: This sheet is  a summary. It may not cover all possible information. If you have questions about this medicine, talk to your doctor, pharmacist, or health care provider.  2019 Elsevier/Gold Standard (2013-06-10 14:12:02)

## 2019-02-27 LAB — BASIC METABOLIC PANEL
Anion gap: 12 (ref 5–15)
BUN: 28 mg/dL — ABNORMAL HIGH (ref 8–23)
CO2: 22 mmol/L (ref 22–32)
Calcium: 8.4 mg/dL — ABNORMAL LOW (ref 8.9–10.3)
Chloride: 102 mmol/L (ref 98–111)
Creatinine, Ser: 1.29 mg/dL — ABNORMAL HIGH (ref 0.44–1.00)
GFR calc Af Amer: 47 mL/min — ABNORMAL LOW (ref >60)
GFR calc non Af Amer: 40 mL/min — ABNORMAL LOW (ref >60)
Glucose, Bld: 175 mg/dL — ABNORMAL HIGH (ref 70–99)
Potassium: 3.6 mmol/L (ref 3.5–5.1)
Sodium: 136 mmol/L (ref 135–145)

## 2019-02-27 MED ORDER — AMIODARONE HCL 200 MG PO TABS
200.0000 mg | ORAL_TABLET | Freq: Two times a day (BID) | ORAL | Status: DC
  Administered 2019-02-27 – 2019-02-28 (×3): 200 mg via ORAL
  Filled 2019-02-27 (×3): qty 1

## 2019-02-27 NOTE — Progress Notes (Signed)
Progress Note  Patient Name: Carrie [name] Date of Encounter: 02/27/2019  Primary Cardiologist: No primary care provider on file.   Subjective   No complaints Spoke with daughter in law Carrie [name]  Inpatient Medications    Scheduled Meds:  amiodarone  200 mg Oral BID   apixaban  5 mg Oral BID   carvedilol  3.125 mg Oral BID WC   furosemide  10 mg Oral Daily   levothyroxine  112 mcg Oral Q0600   sacubitril-valsartan  1 tablet Oral BID   Continuous Infusions:  PRN Meds: LORazepam **OR** LORazepam, sodium chloride flush, traMADol   Vital Signs    Vitals:   02/26/19 0430 02/26/19 1416 02/26/19 2104 02/27/19 0458  BP: 121/86 115/82 117/80 139/87  Pulse: 72 82 73 64  Resp: 14  18 18   Temp: 98.2 F (36.8 C) 98.2 F (36.8 C) 98.3 F (36.8 C) 97.8 F (36.6 C)  TempSrc: Oral Oral Oral Oral  SpO2: 95% 100% 96% 97%  Weight: 62.2 kg   62.7 kg  Height:        Intake/Output Summary (Last 24 hours) at 02/27/2019 0756 Last data filed at 02/27/2019 0600 Gross per 24 hour  Intake 970.6 ml  Output 2000 ml  Net -1029.4 ml   Filed Weights   02/25/19 1225 02/26/19 0430 02/27/19 0458  Weight: 62.6 kg 62.2 kg 62.7 kg    Telemetry    NSR  - Personally Reviewed  Physical Exam    BP 139/87 (BP Location: Left Arm)    Pulse 64    Temp 97.8 F (36.6 C) (Oral)    Resp 18    Ht 5\' 1"  (1.549 m)    Wt 62.7 kg    SpO2 97%    BMI 26.12 kg/m  Port a cath right subclavian Lungs clear S1/S2 MR murmur  Abdomen soft  No edema   Labs    Chemistry Recent Labs  Lab 02/26/19 0403 02/26/19 1056 02/27/19 0501  NA 140 138 136  K 3.7 3.3* 3.6  CL 106 103 102  CO2 23 24 22   GLUCOSE 123* 92 175*  BUN 31* 27* 28*  CREATININE 1.29* 1.28* 1.29*  CALCIUM 8.4* 8.4* 8.4*  GFRNONAA 40* 41* 40*  GFRAA 47* 47* 47*  ANIONGAP 11 11 12      Hematology Recent Labs  Lab 02/25/19 1243 02/26/19 0403  WBC 5.9 5.4  RBC 3.95 3.70*  HGB 12.8 12.1  HCT 39.3 36.7  MCV 99.5 99.2  MCH  32.4 32.7  MCHC 32.6 33.0  RDW 13.1 13.1  PLT 227 206    Cardiac EnzymesNo results for input(s): TROPONINI in the last 168 hours. No results for input(s): TROPIPOC in the last 168 hours.   BNP Recent Labs  Lab 02/25/19 1243  BNP 979.1*     DDimer No results for input(s): DDIMER in the last 168 hours.   Radiology    Dg Chest Port 1 View  Result Date: 02/25/2019 CLINICAL DATA:  Chest pain, weakness EXAM: PORTABLE CHEST 1 VIEW COMPARISON:  02/16/2019 FINDINGS: Unchanged AP portable examination with cardiomegaly. No airspace opacity. Right chest port catheter. Left chest loop recorder. IMPRESSION: Unchanged AP portable examination with cardiomegaly. No airspace opacity. Right chest port catheter. Left chest loop recorder. Electronically Signed   By: Eddie Candle M.D.   On: 02/25/2019 13:09    Cardiac Studies   Echocardiogram 02/25/2019: IMPRESSIONS    1. The left ventricle has severely reduced systolic function, with an ejection  fraction of 20-25%. The cavity size was normal. Left ventricular diastolic function could not be evaluated secondary to atrial fibrillation. Left ventricular diffuse  hypokinesis.  2. The right ventricle has severely reduced systolic function. The cavity was mildly enlarged. There is no increase in right ventricular wall thickness. Right ventricular systolic pressure is mildly elevated with an estimated pressure of 23.5 mmHg.  3. Left atrial size was mildly dilated.  4. Right atrial size was mildly dilated.  5. Trivial pericardial effusion is present.  6. The mitral valve is grossly normal. Mild thickening of the mitral valve leaflet. Mitral valve regurgitation is moderate by color flow Doppler.  7. The aortic valve is tricuspid. Aortic valve regurgitation is trivial by color flow Doppler. No stenosis of the aortic valve.  SUMMARY   When compared to the prior study from 2016 there is a significant change, LVEF has decreased from 50-55% to 15-20% with  diffuse hypokinesis. RVEF is severely decreased.  Patient Profile     78 y.o. female with a PMH of HTN, PAF (s/p recent cardioversion 12/20/3612), follicular lymphoma s/p chemo (last tx 01/25/2019), hypothyroidism, CKD stage 3, who presented with SOB and weakness, found to have atrial fibrillation with RVR TTE done 5/10 with newly discovered cardiomyopathy EF 20-25%  Assessment & Plan    1. Acute combined CHF: presented with SOB and weakness. BNP 979. CXR without overt edema. Felt to be mildly volume overloaded on consult yesterday and given IV lasix 40mg  x2 doses - doubt I&Os documented completely. Echo 02/25/2019 showed EF 20-25% (previously 50-55% in 2016) with diffuse hypokinesis, severely reduced RV systolic function, and moderate MR. TSH wnl 02/16/2019. Possible this is tachycardia mediated  Vs chemoRx vs HTN mediated, though cannot exclude ischemic heart disease. Last ischemic evaluation was a NST in 2017 which was negative for ischemia, EF 55%.  - Diuresis continue low dose lasix Cr / K stable K 3.6 this am  - Carvedilol 3.125 bid  - Cr 1.29 today - BP not too low on entresto  Have sent not to Dr Alvy Bimler regarding low EF as her chemoRx will have to be altered and no more rituximab. She indicated she completed her course last month   2. Paroxysmal atrial fibrillation: patient presented with SOB and weakness. Found to be in Afib RVR on admission with rates int he 130s. Started on a diltiazem gtt initially c/b hypotension, therefore transitioned to an amiodarone gtt. NSR this am  Currently wearing a zio patch monitor. - D/C iv start PO amiodarone 200 mg bid   - Eliquis for anticoagulation   3. HTN: BP up a bit. Not on any antihypertensives prior to admission. - normal follow for hypotension with starting entresto  4. Acute on chronic renal insufficiency stage 3: Cr 1.29 today; baseline ~1.  - Continue to monitor closely with diuresis.    Jenkins Rouge

## 2019-02-27 NOTE — Progress Notes (Addendum)
PROGRESS NOTE    Carrie [name]  UTM:[id number] DOB: 1941-09-26 DOA: 02/25/2019 PCP: Reubin Milan, MD   Brief Narrative: Patient is a 78 year old female with history of anxiety, A. fib, CKD stage III, hypothyroidism, follicular lymphoma grade 3A, hypertension who presents to the emergency department with complaints of shortness of breath and generalized weakness.  Patient was found to be in A. fib with RVR.  Started on Cardizem drip.  Cardiology consulted.  Echocardiogram showed new onset systolic CHF.  Assessment & Plan:   Active Problems:   Hypothyroidism   Grade 3a follicular lymphoma of lymph nodes of multiple regions (HCC)   Chronic kidney disease, stage III (moderate) (HCC)   Atrial fibrillation with rapid ventricular response (HCC)   Essential hypertension   Chronic atrial fibrillation   Chronic back pain   Acute back pain with sciatica, right   Generalized weakness   Insomnia disorder   Dyspnea on exertion   Anxiety disorder   Atrial fibrillation with RVR (HCC)   Acute systolic congestive heart failure (HCC)  A. fib with RVR: Currently heart rate controlled.  Became hypotensive with Cardizem drip.  Started on amiodarone drip.Amiodarone changed to PO. This morning heart rate was well controlled. She was in NSR. Cardiology following.    She is on Eliquis at home which has been restarted.CHADSVasc score 5 She also had a Zio patch monitor on her right chest when she presented here and it was placed in [facility] in [place].  It has been removed now.  Acute systolic congestive heart failure: Most likely secondary to A. fib with RVR.  Previous echocardiogram showed normal left ventricular ejection fraction of 60 to 65% in 2016.  She has been given 2 doses of IV Lasix. Started on low dose oral lasix. She does not have any signs and symptoms of heart failure. She has been started on carvedilol and Entresto.  Cardiology not planning for ischemic work-up at present  .  Hypertension: Currently blood pressure stable.  Continue to monitor  Anxiety: Continue Ativan as needed  Hypothyroidism: Continue Synthyroid  AKI on CKD stage III: Baseline creatinine ranges from 1-1.14.  Currently kidney function close at baseline.Check BMP tomorrow.  Chronic back pain: Continue supportive care.  Pain management  Follicular lymphoma: Her last chemotherapy was in April.  Follows with Dr. Alvy Bimler.         DVT prophylaxis: Eliquis Code Status: Full Family Communication: None present at the bedside Disposition Plan: Home after cardiology clearance.Likely tomorrow   Consultants: Cardiology  Procedures: Echocardiogram  Antimicrobials:  Anti-infectives (From admission, onward)   None      Subjective: Patient seen and examined the bedside this morning.  Hemodynamically stable.  Currently heart rate is controlled.  Denies any shortness of breath or chest pain.  Comfortable.  Objective: Vitals:   02/26/19 0430 02/26/19 1416 02/26/19 2104 02/27/19 0458  BP: 121/86 115/82 117/80 139/87  Pulse: 72 82 73 64  Resp: 14  18 18   Temp: 98.2 F (36.8 C) 98.2 F (36.8 C) 98.3 F (36.8 C) 97.8 F (36.6 C)  TempSrc: Oral Oral Oral Oral  SpO2: 95% 100% 96% 97%  Weight: 62.2 kg   62.7 kg  Height:        Intake/Output Summary (Last 24 hours) at 02/27/2019 1312 Last data filed at 02/27/2019 0900 Gross per 24 hour  Intake 810.4 ml  Output 1550 ml  Net -739.6 ml   Filed Weights   02/25/19 1225 02/26/19 0430 02/27/19 0458  Weight:  62.6 kg 62.2 kg 62.7 kg    Examination:  General exam: Appears calm and comfortable ,Not in distress,average built HEENT:PERRL,Oral mucosa moist, Ear/Nose normal on gross exam Respiratory system: Bilateral equal air entry, normal vesicular breath sounds, no wheezes or crackles  Cardiovascular system: S1 & S2 heard, RRR. No JVD, murmurs, rubs, gallops or clicks.  Port on the right chest. Gastrointestinal system: Abdomen is  nondistended, soft and nontender. No organomegaly or masses felt. Normal bowel sounds heard. Central nervous system: Alert and oriented. No focal neurological deficits. Extremities: Trace pedal edema, no clubbing ,no cyanosis, distal peripheral pulses palpable. Skin: No rashes, lesions or ulcers,no icterus ,no pallor MSK: Normal muscle bulk,tone ,power Psychiatry: Judgement and insight appear normal. Mood & affect appropriate.      Data Reviewed: I have personally reviewed following labs and imaging studies  CBC: Recent Labs  Lab 02/25/19 1243 02/26/19 0403  WBC 5.9 5.4  HGB 12.8 12.1  HCT 39.3 36.7  MCV 99.5 99.2  PLT 227 979   Basic Metabolic Panel: Recent Labs  Lab 02/25/19 1243 02/26/19 0403 02/26/19 1056 02/27/19 0501  NA 139 140 138 136  K 4.3 3.7 3.3* 3.6  CL 108 106 103 102  CO2 22 23 24 22   GLUCOSE 86 123* 92 175*  BUN 32* 31* 27* 28*  CREATININE 1.16* 1.29* 1.28* 1.29*  CALCIUM 8.9 8.4* 8.4* 8.4*  MG 2.3 1.9  --   --    GFR: Estimated Creatinine Clearance: 31.5 mL/min (A) (by C-G formula based on SCr of 1.29 mg/dL (H)). Liver Function Tests: No results for input(s): AST, ALT, ALKPHOS, BILITOT, PROT, ALBUMIN in the last 168 hours. No results for input(s): LIPASE, AMYLASE in the last 168 hours. No results for input(s): AMMONIA in the last 168 hours. Coagulation Profile: No results for input(s): INR, PROTIME in the last 168 hours. Cardiac Enzymes: No results for input(s): CKTOTAL, CKMB, CKMBINDEX, TROPONINI in the last 168 hours. BNP (last 3 results) No results for input(s): PROBNP in the last 8760 hours. HbA1C: No results for input(s): HGBA1C in the last 72 hours. CBG: No results for input(s): GLUCAP in the last 168 hours. Lipid Profile: No results for input(s): CHOL, HDL, LDLCALC, TRIG, CHOLHDL, LDLDIRECT in the last 72 hours. Thyroid Function Tests: No results for input(s): TSH, T4TOTAL, FREET4, T3FREE, THYROIDAB in the last 72 hours. Anemia  Panel: No results for input(s): VITAMINB12, FOLATE, FERRITIN, TIBC, IRON, RETICCTPCT in the last 72 hours. Sepsis Labs: No results for input(s): PROCALCITON, LATICACIDVEN in the last 168 hours.  Recent Results (from the past 240 hour(s))  SARS Coronavirus 2 (CEPHEID - Performed in [facility] lab), Hosp Order     Status: None   Collection Time: 02/25/19  2:30 PM  Result Value Ref Range Status   SARS Coronavirus 2 NEGATIVE NEGATIVE Final    Comment: (NOTE) If result is NEGATIVE SARS-CoV-2 target nucleic acids are NOT DETECTED. The SARS-CoV-2 RNA is generally detectable in upper and lower  respiratory specimens during the acute phase of infection. The lowest  concentration of SARS-CoV-2 viral copies this assay can detect is 250  copies / mL. A negative result does not preclude SARS-CoV-2 infection  and should not be used as the sole basis for treatment or other  patient management decisions.  A negative result may occur with  improper specimen collection / handling, submission of specimen other  than nasopharyngeal swab, presence of viral mutation(s) within the  areas targeted by this assay, and inadequate  number of viral copies  (<250 copies / mL). A negative result must be combined with clinical  observations, patient history, and epidemiological information. If result is POSITIVE SARS-CoV-2 target nucleic acids are DETECTED. The SARS-CoV-2 RNA is generally detectable in upper and lower  respiratory specimens dur ing the acute phase of infection.  Positive  results are indicative of active infection with SARS-CoV-2.  Clinical  correlation with patient history and other diagnostic information is  necessary to determine patient infection status.  Positive results do  not rule out bacterial infection or co-infection with other viruses. If result is PRESUMPTIVE POSTIVE SARS-CoV-2 nucleic acids MAY BE PRESENT.   A presumptive positive result was obtained on the submitted  specimen  and confirmed on repeat testing.  While 2019 novel coronavirus  (SARS-CoV-2) nucleic acids may be present in the submitted sample  additional confirmatory testing may be necessary for epidemiological  and / or clinical management purposes  to differentiate between  SARS-CoV-2 and other Sarbecovirus currently known to infect humans.  If clinically indicated additional testing with an alternate test  methodology [phone]) is advised. The SARS-CoV-2 RNA is generally  detectable in upper and lower respiratory sp ecimens during the acute  phase of infection. The expected result is Negative. Fact Sheet for Patients:  StrictlyIdeas.no Fact Sheet for Healthcare Providers: BankingDealers.co.za This test is not yet approved or cleared by the [place] FDA and has been authorized for detection and/or diagnosis of SARS-CoV-2 by FDA under an Emergency Use Authorization (EUA).  This EUA will remain in effect (meaning this test can be used) for the duration of the COVID-19 declaration under Section 564(b)(1) of the Act, 21 U.S.C. section 360bbb-3(b)(1), unless the authorization is terminated or revoked sooner. Performed at [facility], [place] [address] [place], [place] [address]          Radiology Studies: No results found.      Scheduled Meds:  amiodarone  200 mg Oral BID   apixaban  5 mg Oral BID   carvedilol  3.125 mg Oral BID WC   furosemide  10 mg Oral Daily   levothyroxine  112 mcg Oral Q0600   sacubitril-valsartan  1 tablet Oral BID   Continuous Infusions:    LOS: 2 days    Time spent: 35 mins.More than 50% of that time was spent in counseling and/or coordination of care.      Shelly Coss, MD [facility] Pager [phone]  If 7PM-7AM, please contact night-coverage [email or web address] Password [email or web address] 02/27/2019, 1:12 PM

## 2019-02-28 LAB — BASIC METABOLIC PANEL
Anion gap: 10 (ref 5–15)
BUN: 28 mg/dL — ABNORMAL HIGH (ref 8–23)
CO2: 23 mmol/L (ref 22–32)
Calcium: 8.5 mg/dL — ABNORMAL LOW (ref 8.9–10.3)
Chloride: 105 mmol/L (ref 98–111)
Creatinine, Ser: 1.31 mg/dL — ABNORMAL HIGH (ref 0.44–1.00)
GFR calc Af Amer: 46 mL/min — ABNORMAL LOW (ref >60)
GFR calc non Af Amer: 39 mL/min — ABNORMAL LOW (ref >60)
Glucose, Bld: 111 mg/dL — ABNORMAL HIGH (ref 70–99)
Potassium: 3.7 mmol/L (ref 3.5–5.1)
Sodium: 138 mmol/L (ref 135–145)

## 2019-02-28 MED ORDER — HEPARIN SOD (PORK) LOCK FLUSH 100 UNIT/ML IV SOLN
500.0000 [IU] | INTRAVENOUS | Status: AC | PRN
  Administered 2019-02-28: 12:00:00 500 [IU]

## 2019-02-28 MED ORDER — AMIODARONE HCL 200 MG PO TABS: 200.0000 mg | ORAL_TABLET | Freq: Two times a day (BID) | ORAL | 0 refills | Status: DC

## 2019-02-28 MED ORDER — CARVEDILOL 3.125 MG PO TABS: 3.1250 mg | ORAL_TABLET | Freq: Two times a day (BID) | ORAL | 0 refills | Status: DC

## 2019-02-28 MED ORDER — FUROSEMIDE 20 MG PO TABS: 10.0000 mg | ORAL_TABLET | Freq: Every day | ORAL | 0 refills | Status: DC

## 2019-02-28 MED ORDER — SACUBITRIL-VALSARTAN 24-26 MG PO TABS: 1.0000 | ORAL_TABLET | Freq: Two times a day (BID) | ORAL | 0 refills | Status: DC

## 2019-02-28 NOTE — Progress Notes (Signed)
Progress Note  Patient Name: Carrie [name] Date of Encounter: 02/28/2019  Primary Cardiologist: No primary care provider on file.   Subjective   No complaints Spoke with daughter in law [place]  Inpatient Medications    Scheduled Meds: . amiodarone  200 mg Oral BID  . apixaban  5 mg Oral BID  . carvedilol  3.125 mg Oral BID WC  . furosemide  10 mg Oral Daily  . levothyroxine  112 mcg Oral Q0600  . sacubitril-valsartan  1 tablet Oral BID   Continuous Infusions:  PRN Meds: LORazepam **OR** LORazepam, sodium chloride flush, traMADol   Vital Signs    Vitals:   02/27/19 1743 02/27/19 2122 02/28/19 0401 02/28/19 0405  BP:  123/72  134/85  Pulse: 75 65  63  Resp:  20  18  Temp:  (!) 97.5 F (36.4 C)  97.6 F (36.4 C)  TempSrc:  Oral  Oral  SpO2:  97%  96%  Weight:   61.6 kg   Height:        Intake/Output Summary (Last 24 hours) at 02/28/2019 0752 Last data filed at 02/28/2019 0600 Gross per 24 hour  Intake 720 ml  Output 1050 ml  Net -330 ml   Filed Weights   02/26/19 0430 02/27/19 0458 02/28/19 0401  Weight: 62.2 kg 62.7 kg 61.6 kg    Telemetry    NSR  - Personally Reviewed  Physical Exam    BP 134/85 (BP Location: Right Arm)   Pulse 63   Temp 97.6 F (36.4 C) (Oral)   Resp 18   Ht 5\' 1"  (1.549 m)   Wt 61.6 kg   SpO2 96%   BMI 25.64 kg/m  Port a cath right subclavian Lungs clear S1/S2 MR murmur  Abdomen soft  No edema   Labs    Chemistry Recent Labs  Lab 02/26/19 1056 02/27/19 0501 02/28/19 0356  NA 138 136 138  K 3.3* 3.6 3.7  CL 103 102 105  CO2 24 22 23   GLUCOSE 92 175* 111*  BUN 27* 28* 28*  CREATININE 1.28* 1.29* 1.31*  CALCIUM 8.4* 8.4* 8.5*  GFRNONAA 41* 40* 39*  GFRAA 47* 47* 46*  ANIONGAP 11 12 10      Hematology Recent Labs  Lab 02/25/19 1243 02/26/19 0403  WBC 5.9 5.4  RBC 3.95 3.70*  HGB 12.8 12.1  HCT 39.3 36.7  MCV 99.5 99.2  MCH 32.4 32.7  MCHC 32.6 33.0  RDW 13.1 13.1  PLT 227 206    Cardiac  EnzymesNo results for input(s): TROPONINI in the last 168 hours. No results for input(s): TROPIPOC in the last 168 hours.   BNP Recent Labs  Lab 02/25/19 1243  BNP 979.1*     DDimer No results for input(s): DDIMER in the last 168 hours.   Radiology    No results found.  Cardiac Studies   Echocardiogram 02/25/2019: IMPRESSIONS    1. The left ventricle has severely reduced systolic function, with an ejection fraction of 20-25%. The cavity size was normal. Left ventricular diastolic function could not be evaluated secondary to atrial fibrillation. Left ventricular diffuse  hypokinesis.  2. The right ventricle has severely reduced systolic function. The cavity was mildly enlarged. There is no increase in right ventricular wall thickness. Right ventricular systolic pressure is mildly elevated with an estimated pressure of 23.5 mmHg.  3. Left atrial size was mildly dilated.  4. Right atrial size was mildly dilated.  5. Trivial pericardial effusion is  present.  6. The mitral valve is grossly normal. Mild thickening of the mitral valve leaflet. Mitral valve regurgitation is moderate by color flow Doppler.  7. The aortic valve is tricuspid. Aortic valve regurgitation is trivial by color flow Doppler. No stenosis of the aortic valve.  SUMMARY   When compared to the prior study from 2016 there is a significant change, LVEF has decreased from 50-55% to 15-20% with diffuse hypokinesis. RVEF is severely decreased.  Patient Profile     78 y.o. female with a PMH of HTN, PAF (s/p recent cardioversion 10/23/1094), follicular lymphoma s/p chemo (last tx 01/25/2019), hypothyroidism, CKD stage 3, who presented with SOB and weakness, found to have atrial fibrillation with RVR TTE done 5/10 with newly discovered cardiomyopathy EF 20-25%  Assessment & Plan    1. Acute combined CHF: presented with SOB and weakness. BNP 979. CXR without overt edema. Felt to be mildly volume overloaded on consult  yesterday and given IV lasix 40mg  x2 doses - doubt I&Os documented completely. Echo 02/25/2019 showed EF 20-25% (previously 50-55% in 2016) with diffuse hypokinesis, severely reduced RV systolic function, and moderate MR. TSH wnl 02/16/2019. Possible this is tachycardia mediated  Vs chemoRx vs HTN mediated, though cannot exclude ischemic heart disease. Last ischemic evaluation was a NST in 2017 which was negative for ischemia, EF 55%.  - Diuresis continue low dose lasix Cr / K stable K 3.6 this am  - Carvedilol 3.125 bid  - Cr 1.3 today - BP not too low on entresto  Have sent not to Dr Alvy Bimler regarding low EF as her chemoRx will have to be altered and no more rituximab. She indicated she completed her course last month   2. Paroxysmal atrial fibrillation: patient presented with SOB and weakness. Found to be in Afib RVR on admission with rates int he 130s. Started on a diltiazem gtt initially c/b hypotension, therefore transitioned to an amiodarone gtt. NSR this am  Currently wearing a zio patch monitor. - D/C iv start PO amiodarone 200 mg bid   - Eliquis for anticoagulation   3. HTN: BP up a bit. Not on any antihypertensives prior to admission. - normal follow for hypotension with starting entresto  4. Acute on chronic renal insufficiency stage 3: Cr 1.3today; baseline ~1.  - Stable  Ok to d/c home will arrange outpatient f/u with Pharm D to titrate entresto and f/u with Dr Orion Modest

## 2019-02-28 NOTE — Progress Notes (Signed)
Patient A&O x4, ambulatory without assistance. Discharge instructions reviewed, questions concerns denied. Hard copy prescriptions provided.

## 2019-02-28 NOTE — Discharge Summary (Addendum)
Physician Discharge Summary  MICALA SALTSMAN IRS:[id number] DOB: 01-05-1941 DOA: 02/25/2019  PCP: Reubin Milan, MD  Admit date: 02/25/2019 Discharge date: 02/28/2019  Admitted From: Home Disposition: Home  Recommendations for Outpatient Follow-up:  1. Follow up with PCP in 1-2 weeks 2. Please obtain BMP/CBC in one week 3. Please follow up on the following pending results:  Home Health: None Equipment/Devices: None  Discharge Condition: Stable CODE STATUS: Full code Diet recommendation: Low-sodium/heart healthy  Subjective: Patient seen and examined.  She has no complaints.  Denies any palpitation, chest pain or shortness of breath.  Brief/Interim Summary: Patient is a 78 year old female with history of anxiety, A. fib, CKD stage III, hypothyroidism, follicular lymphoma grade 3A, hypertension who presentd to the emergency department with complaints of shortness of breath and generalized weakness.  Patient was found to be in A. fib with RVR. Cardiology consulted.  Echocardiogram showed new onset systolic CHF.  She was initially started on Cardizem drip but that lowered her blood pressure so she was then started on amiodarone drip which was subsequently switched to oral amiodarone.  She converted to sinus rhythm.  She was seen by cardiology but there was no plan for cardiac catheterization.  She was started on low-dose carvedilol 3.125 mg twice daily as well as Entresto.  Her renal function remained stable.  She was diuresed here and then later on her Lasix was reduced to dose to 10 mg p.o. daily by cardiology.  She is much better today with no symptoms.  She was seen by cardiology and they had cleared the patient as well.  She is being discharged on 3 new medications which will be carvedilol, Entresto and amiodarone and her Lasix is being reduced in dose as mentioned above.  Discharge Diagnoses:  Active Problems:   Hypothyroidism   Grade 3a follicular lymphoma of lymph nodes of multiple  regions (HCC)   Chronic kidney disease, stage III (moderate) (HCC)   Atrial fibrillation with rapid ventricular response (HCC)   Essential hypertension   Chronic atrial fibrillation   Chronic back pain   Acute back pain with sciatica, right   Generalized weakness   Insomnia disorder   Dyspnea on exertion   Anxiety disorder   Atrial fibrillation with RVR (HCC)   Acute systolic congestive heart failure [facility])    Discharge Instructions  Discharge Instructions    Discharge patient   Complete by:  As directed    Discharge disposition:  01-Home or Self Care   Discharge patient date:  02/28/2019     Allergies as of 02/28/2019   No Known Allergies    Allergies as of 02/28/2019   No Known Allergies    Allergies as of 02/28/2019   No Known Allergies     Medication List    TAKE these medications   acetaminophen 500 MG tablet Commonly known as:  TYLENOL Take 500 mg by mouth every 6 (six) hours as needed for moderate pain or headache.   acyclovir 400 MG tablet Commonly known as:  ZOVIRAX Take 1 tablet (400 mg total) by mouth daily.   amiodarone 200 MG tablet Commonly known as:  PACERONE Take 1 tablet (200 mg total) by mouth 2 (two) times daily.   bisacodyl 5 MG EC tablet Commonly known as:  DULCOLAX Take 5 mg by mouth daily as needed for moderate constipation.   carvedilol 3.125 MG tablet Commonly known as:  COREG Take 1 tablet (3.125 mg total) by mouth 2 (two) times daily with a meal.  cephALEXin 500 MG capsule Commonly known as:  KEFLEX Take 1 capsule (500 mg total) by mouth 3 (three) times daily.   diclofenac sodium 1 % Gel Commonly known as:  VOLTAREN Apply 2 g topically 3 (three) times daily as needed (knee pain).   Eliquis 5 MG Tabs tablet Generic drug:  apixaban Take 5 mg by mouth 2 (two) times daily.   furosemide 20 MG tablet Commonly known as:  LASIX Take 0.5 tablets (10 mg total) by mouth daily. Start taking on:  Mar 01, 2019 What changed:  how  much to take   levothyroxine 112 MCG tablet Commonly known as:  SYNTHROID Take 112 mcg by mouth daily.   lidocaine-prilocaine cream Commonly known as:  EMLA Apply to affected area once   LORazepam 0.5 MG tablet Commonly known as:  Ativan Take 1 tablet (0.5 mg total) by mouth every 6 (six) hours as needed (Nausea or vomiting).   omeprazole 40 MG capsule Commonly known as:  PRILOSEC Take 1 capsule (40 mg total) by mouth daily.   ondansetron 8 MG disintegrating tablet Commonly known as:  Zofran ODT Take 1 tablet (8 mg total) by mouth every 8 (eight) hours as needed for nausea.   ondansetron 8 MG tablet Commonly known as:  Zofran Take 1 tablet (8 mg total) by mouth 2 (two) times daily as needed for refractory nausea / vomiting. Start on day 2 after bendamustine chemotherapy.   polyvinyl alcohol 1.4 % ophthalmic solution Commonly known as:  LIQUIFILM TEARS Place 1 drop into both eyes as needed for dry eyes.   potassium chloride SA 20 MEQ tablet Commonly known as:  K-DUR Take 1 tablet (20 mEq total) by mouth 2 (two) times daily.   prochlorperazine 10 MG tablet Commonly known as:  COMPAZINE Take 1 tablet (10 mg total) by mouth every 6 (six) hours as needed (Nausea or vomiting).   sacubitril-valsartan 24-26 MG Commonly known as:  ENTRESTO Take 1 tablet by mouth 2 (two) times daily.       No Known Allergies  Consultations: Cardiology   Procedures/Studies: Ct Angio Chest Pe W And/or Wo Contrast  Result Date: 01/30/2019 CLINICAL DATA:  78 year old female with weakness and abdominal pain EXAM: CT ANGIOGRAPHY CHEST CT ABDOMEN AND PELVIS WITH CONTRAST TECHNIQUE: Multidetector CT imaging of the chest was performed using the standard protocol during bolus administration of intravenous contrast. Multiplanar CT image reconstructions and MIPs were obtained to evaluate the vascular anatomy. Multidetector CT imaging of the abdomen and pelvis was performed using the standard protocol  during bolus administration of intravenous contrast. CONTRAST:  130mL OMNIPAQUE IOHEXOL 350 MG/ML SOLN COMPARISON:  12/15/2018, PET-CT 08/08/2018, 09/14/2017 FINDINGS: CTA CHEST FINDINGS Cardiovascular: Heart: Heart size unchanged with cardiomegaly. No pericardial fluid/thickening. No significant coronary calcifications. Aorta: Unremarkable course, caliber, contour of the thoracic aorta. No aneurysm or dissection flap. No periaortic fluid. Pulmonary arteries: No central, lobar, segmental, or proximal subsegmental filling defects. Mediastinum/Nodes: No mediastinal adenopathy. Unremarkable appearance of the thoracic esophagus. Unremarkable appearance of the thoracic inlet and thyroid. Lungs/Pleura: Geographic ground-glass opacity in the dependent aspects of the lungs, similar to prior. No pneumothorax. No interlobular septal thickening. No pleural effusion. No pneumothorax. No endotracheal or endobronchial debris. 4 mm nodules associated with the minor fissure (image 61 of series 16), unchanged. Unchanged nodule of the left lower lobe measuring 4 mm (image 72 of series 16). No confluent airspace disease. Musculoskeletal: No acute displaced fracture. Degenerative changes of the spine. No recurrent soft tissue adjacent to the  left eleventh rib. No recurrent axillary adenopathy or chest wall adenopathy/nodules. Right chest wall port catheter via IJ approach. Review of the MIP images confirms the above findings. CT ABDOMEN and PELVIS FINDINGS Hepatobiliary: Unremarkable appearance of liver parenchyma. Mild intrahepatic biliary ductal dilatation, new from the comparison. No radiopaque stones of the gallbladder. No inflammatory changes adjacent to the gallbladder. The common bile duct measures 5 mm, slightly larger than the comparison. No radiopaque stones identified. Pancreas: Unremarkable Spleen: Unremarkable Adrenals/Urinary Tract: Unremarkable adrenal glands. Bilateral kidneys unremarkable without hydronephrosis or  nephrolithiasis. Unremarkable course the bilateral ureters. Unremarkable urinary bladder. Stomach/Bowel: Hiatal hernia with otherwise unremarkable stomach. Unremarkable small bowel. No transition point or focal wall thickening. No inflammatory changes. Surgical changes of appendectomy. Mild stool burden. No inflammatory changes or wall thickening. Mild diverticular change without acute inflammation. Vascular/Lymphatic: Atherosclerotic changes are mild. No aneurysm or periaortic fluid. Bilateral iliac arteries and proximal femoral arteries patent. Unremarkable appearance of the venous system. Reproductive: Hysterectomy Other: No abdominal wall hernia. Musculoskeletal: Surgical changes of posterior lumbar interbody fusion with laminectomy of L3-L5. Streak artifact somewhat limits evaluation. Degenerative changes of the hips. Review of the MIP images confirms the above findings. IMPRESSION: Negative for pulmonary emboli.  No acute finding of the chest. No acute CT finding of abdomen/pelvis to account for abdominal pain. New mild intrahepatic ductal dilatation with the common bile duct measuring 5 mm. This is of uncertain significance, as there is no CT evidence of cholelithiasis or radiopaque stones in the common bile duct. If there is concern for biliary colic/dyskinesia, correlation with lab values/patient presentation may be useful. No evidence of progression/recurrence of lymphadenopathy Additional ancillary findings as above. Electronically Signed   By: Corrie Mckusick D.O.   On: 01/30/2019 11:14   Ct Abdomen Pelvis W Contrast  Result Date: 01/30/2019 CLINICAL DATA:  78 year old female with weakness and abdominal pain EXAM: CT ANGIOGRAPHY CHEST CT ABDOMEN AND PELVIS WITH CONTRAST TECHNIQUE: Multidetector CT imaging of the chest was performed using the standard protocol during bolus administration of intravenous contrast. Multiplanar CT image reconstructions and MIPs were obtained to evaluate the vascular  anatomy. Multidetector CT imaging of the abdomen and pelvis was performed using the standard protocol during bolus administration of intravenous contrast. CONTRAST:  135mL OMNIPAQUE IOHEXOL 350 MG/ML SOLN COMPARISON:  12/15/2018, PET-CT 08/08/2018, 09/14/2017 FINDINGS: CTA CHEST FINDINGS Cardiovascular: Heart: Heart size unchanged with cardiomegaly. No pericardial fluid/thickening. No significant coronary calcifications. Aorta: Unremarkable course, caliber, contour of the thoracic aorta. No aneurysm or dissection flap. No periaortic fluid. Pulmonary arteries: No central, lobar, segmental, or proximal subsegmental filling defects. Mediastinum/Nodes: No mediastinal adenopathy. Unremarkable appearance of the thoracic esophagus. Unremarkable appearance of the thoracic inlet and thyroid. Lungs/Pleura: Geographic ground-glass opacity in the dependent aspects of the lungs, similar to prior. No pneumothorax. No interlobular septal thickening. No pleural effusion. No pneumothorax. No endotracheal or endobronchial debris. 4 mm nodules associated with the minor fissure (image 61 of series 16), unchanged. Unchanged nodule of the left lower lobe measuring 4 mm (image 72 of series 16). No confluent airspace disease. Musculoskeletal: No acute displaced fracture. Degenerative changes of the spine. No recurrent soft tissue adjacent to the left eleventh rib. No recurrent axillary adenopathy or chest wall adenopathy/nodules. Right chest wall port catheter via IJ approach. Review of the MIP images confirms the above findings. CT ABDOMEN and PELVIS FINDINGS Hepatobiliary: Unremarkable appearance of liver parenchyma. Mild intrahepatic biliary ductal dilatation, new from the comparison. No radiopaque stones of the gallbladder. No inflammatory changes adjacent  to the gallbladder. The common bile duct measures 5 mm, slightly larger than the comparison. No radiopaque stones identified. Pancreas: Unremarkable Spleen: Unremarkable  Adrenals/Urinary Tract: Unremarkable adrenal glands. Bilateral kidneys unremarkable without hydronephrosis or nephrolithiasis. Unremarkable course the bilateral ureters. Unremarkable urinary bladder. Stomach/Bowel: Hiatal hernia with otherwise unremarkable stomach. Unremarkable small bowel. No transition point or focal wall thickening. No inflammatory changes. Surgical changes of appendectomy. Mild stool burden. No inflammatory changes or wall thickening. Mild diverticular change without acute inflammation. Vascular/Lymphatic: Atherosclerotic changes are mild. No aneurysm or periaortic fluid. Bilateral iliac arteries and proximal femoral arteries patent. Unremarkable appearance of the venous system. Reproductive: Hysterectomy Other: No abdominal wall hernia. Musculoskeletal: Surgical changes of posterior lumbar interbody fusion with laminectomy of L3-L5. Streak artifact somewhat limits evaluation. Degenerative changes of the hips. Review of the MIP images confirms the above findings. IMPRESSION: Negative for pulmonary emboli.  No acute finding of the chest. No acute CT finding of abdomen/pelvis to account for abdominal pain. New mild intrahepatic ductal dilatation with the common bile duct measuring 5 mm. This is of uncertain significance, as there is no CT evidence of cholelithiasis or radiopaque stones in the common bile duct. If there is concern for biliary colic/dyskinesia, correlation with lab values/patient presentation may be useful. No evidence of progression/recurrence of lymphadenopathy Additional ancillary findings as above. Electronically Signed   By: Corrie Mckusick D.O.   On: 01/30/2019 11:14   Dg Chest Port 1 View  Result Date: 02/25/2019 CLINICAL DATA:  Chest pain, weakness EXAM: PORTABLE CHEST 1 VIEW COMPARISON:  02/16/2019 FINDINGS: Unchanged AP portable examination with cardiomegaly. No airspace opacity. Right chest port catheter. Left chest loop recorder. IMPRESSION: Unchanged AP portable  examination with cardiomegaly. No airspace opacity. Right chest port catheter. Left chest loop recorder. Electronically Signed   By: Eddie Candle M.D.   On: 02/25/2019 13:09   Dg Chest Port 1 View  Result Date: 02/16/2019 CLINICAL DATA:  Progressive shortness of breath. EXAM: PORTABLE CHEST 1 VIEW COMPARISON:  CT chest dated January 30, 2019. Chest x-ray dated October 10, 2018. FINDINGS: Unchanged right chest wall port catheter. Stable cardiomegaly. Normal pulmonary vascularity. Minimal bibasilar atelectasis. No focal consolidation, pleural effusion, or pneumothorax. No acute osseous abnormality. IMPRESSION: No active disease. Electronically Signed   By: Titus Dubin M.D.   On: 02/16/2019 13:16     Discharge Exam: Vitals:   02/28/19 0405 02/28/19 0846  BP: 134/85 123/89  Pulse: 63 69  Resp: 18   Temp: 97.6 F (36.4 C)   SpO2: 96%    Vitals:   02/27/19 2122 02/28/19 0401 02/28/19 0405 02/28/19 0846  BP: 123/72  134/85 123/89  Pulse: 65  63 69  Resp: 20  18   Temp: (!) 97.5 F (36.4 C)  97.6 F (36.4 C)   TempSrc: Oral  Oral   SpO2: 97%  96%   Weight:  61.6 kg    Height:        General: Pt is alert, awake, not in acute distress Cardiovascular: RRR, S1/S2 +, no rubs, no gallops Respiratory: CTA bilaterally, no wheezing, no rhonchi Abdominal: Soft, NT, ND, bowel sounds + Extremities: no edema, no cyanosis    The results of significant diagnostics from this hospitalization (including imaging, microbiology, ancillary and laboratory) are listed below for reference.     Microbiology: Recent Results (from the past 240 hour(s))  SARS Coronavirus 2 (CEPHEID - Performed in [place] hospital lab), [facility] Order     Status: None   Collection  Time: 02/25/19  2:30 PM  Result Value Ref Range Status   SARS Coronavirus 2 NEGATIVE NEGATIVE Final    Comment: (NOTE) If result is NEGATIVE SARS-CoV-2 target nucleic acids are NOT DETECTED. The SARS-CoV-2 RNA is generally detectable in  upper and lower  respiratory specimens during the acute phase of infection. The lowest  concentration of SARS-CoV-2 viral copies this assay can detect is 250  copies / mL. A negative result does not preclude SARS-CoV-2 infection  and should not be used as the sole basis for treatment or other  patient management decisions.  A negative result may occur with  improper specimen collection / handling, submission of specimen other  than nasopharyngeal swab, presence of viral mutation(s) within the  areas targeted by this assay, and inadequate number of viral copies  (<250 copies / mL). A negative result must be combined with clinical  observations, patient history, and epidemiological information. If result is POSITIVE SARS-CoV-2 target nucleic acids are DETECTED. The SARS-CoV-2 RNA is generally detectable in upper and lower  respiratory specimens dur ing the acute phase of infection.  Positive  results are indicative of active infection with SARS-CoV-2.  Clinical  correlation with patient history and other diagnostic information is  necessary to determine patient infection status.  Positive results do  not rule out bacterial infection or co-infection with other viruses. If result is PRESUMPTIVE POSTIVE SARS-CoV-2 nucleic acids MAY BE PRESENT.   A presumptive positive result was obtained on the submitted specimen  and confirmed on repeat testing.  While 2019 novel coronavirus  (SARS-CoV-2) nucleic acids may be present in the submitted sample  additional confirmatory testing may be necessary for epidemiological  and / or clinical management purposes  to differentiate between  SARS-CoV-2 and other Sarbecovirus currently known to infect humans.  If clinically indicated additional testing with an alternate test  methodology [phone]) is advised. The SARS-CoV-2 RNA is generally  detectable in upper and lower respiratory sp ecimens during the acute  phase of infection. The expected result is  Negative. Fact Sheet for Patients:  StrictlyIdeas.no Fact Sheet for Healthcare Providers: BankingDealers.co.za This test is not yet approved or cleared by the [place] FDA and has been authorized for detection and/or diagnosis of SARS-CoV-2 by FDA under an Emergency Use Authorization (EUA).  This EUA will remain in effect (meaning this test can be used) for the duration of the COVID-19 declaration under Section 564(b)(1) of the Act, 21 U.S.C. section 360bbb-3(b)(1), unless the authorization is terminated or revoked sooner. Performed at [facility], [place] [address] [place], [place] [address]      Labs: BNP (last 3 results) Recent Labs    01/30/19 0756 02/16/19 1226 02/25/19 1243  BNP 328.8* 1,207.1* 967.5*   Basic Metabolic Panel: Recent Labs  Lab 02/25/19 1243 02/26/19 0403 02/26/19 1056 02/27/19 0501 02/28/19 0356  NA 139 140 138 136 138  K 4.3 3.7 3.3* 3.6 3.7  CL 108 106 103 102 105  CO2 22 23 24 22 23   GLUCOSE 86 123* 92 175* 111*  BUN 32* 31* 27* 28* 28*  CREATININE 1.16* 1.29* 1.28* 1.29* 1.31*  CALCIUM 8.9 8.4* 8.4* 8.4* 8.5*  MG 2.3 1.9  --   --   --    Liver Function Tests: No results for input(s): AST, ALT, ALKPHOS, BILITOT, PROT, ALBUMIN in the last 168 hours. No results for input(s): LIPASE, AMYLASE in the last 168 hours. No results for input(s): AMMONIA in the last 168 hours. CBC:  Recent Labs  Lab 02/25/19 1243 02/26/19 0403  WBC 5.9 5.4  HGB 12.8 12.1  HCT 39.3 36.7  MCV 99.5 99.2  PLT 227 206   Cardiac Enzymes: No results for input(s): CKTOTAL, CKMB, CKMBINDEX, TROPONINI in the last 168 hours. BNP: Invalid input(s): POCBNP CBG: No results for input(s): GLUCAP in the last 168 hours. D-Dimer No results for input(s): DDIMER in the last 72 hours. Hgb A1c No results for input(s): HGBA1C in the last 72 hours. Lipid Profile No results for input(s): CHOL, HDL, LDLCALC,  TRIG, CHOLHDL, LDLDIRECT in the last 72 hours. Thyroid function studies No results for input(s): TSH, T4TOTAL, T3FREE, THYROIDAB in the last 72 hours.  Invalid input(s): FREET3 Anemia work up No results for input(s): VITAMINB12, FOLATE, FERRITIN, TIBC, IRON, RETICCTPCT in the last 72 hours. Urinalysis    Component Value Date/Time   COLORURINE YELLOW 02/16/2019 1218   [place] 02/16/2019 1218   LABSPEC 1.014 02/16/2019 1218   LABSPEC 1.005 03/18/2017 1315   PHURINE 5.0 02/16/2019 1218   GLUCOSEU NEGATIVE 02/16/2019 1218   GLUCOSEU Negative 03/18/2017 1315   HGBUR SMALL (A) 02/16/2019 1218   BILIRUBINUR NEGATIVE 02/16/2019 1218   BILIRUBINUR Negative 03/18/2017 1315   [place] 02/16/2019 1218   PROTEINUR NEGATIVE 02/16/2019 1218   UROBILINOGEN 0.2 03/18/2017 1315   NITRITE NEGATIVE 02/16/2019 1218   LEUKOCYTESUR NEGATIVE 02/16/2019 1218   LEUKOCYTESUR Negative 03/18/2017 1315   Sepsis Labs Invalid input(s): PROCALCITONIN,  WBC,  LACTICIDVEN Microbiology Recent Results (from the past 240 hour(s))  SARS Coronavirus 2 (CEPHEID - Performed in [place] hospital lab), Hosp Order     Status: None   Collection Time: 02/25/19  2:30 PM  Result Value Ref Range Status   SARS Coronavirus 2 NEGATIVE NEGATIVE Final    Comment: (NOTE) If result is NEGATIVE SARS-CoV-2 target nucleic acids are NOT DETECTED. The SARS-CoV-2 RNA is generally detectable in upper and lower  respiratory specimens during the acute phase of infection. The lowest  concentration of SARS-CoV-2 viral copies this assay can detect is 250  copies / mL. A negative result does not preclude SARS-CoV-2 infection  and should not be used as the sole basis for treatment or other  patient management decisions.  A negative result may occur with  improper specimen collection / handling, submission of specimen other  than nasopharyngeal swab, presence of viral mutation(s) within the  areas targeted by this  assay, and inadequate number of viral copies  (<250 copies / mL). A negative result must be combined with clinical  observations, patient history, and epidemiological information. If result is POSITIVE SARS-CoV-2 target nucleic acids are DETECTED. The SARS-CoV-2 RNA is generally detectable in upper and lower  respiratory specimens dur ing the acute phase of infection.  Positive  results are indicative of active infection with SARS-CoV-2.  Clinical  correlation with patient history and other diagnostic information is  necessary to determine patient infection status.  Positive results do  not rule out bacterial infection or co-infection with other viruses. If result is PRESUMPTIVE POSTIVE SARS-CoV-2 nucleic acids MAY BE PRESENT.   A presumptive positive result was obtained on the submitted specimen  and confirmed on repeat testing.  While 2019 novel coronavirus  (SARS-CoV-2) nucleic acids may be present in the submitted sample  additional confirmatory testing may be necessary for epidemiological  and / or clinical management purposes  to differentiate between  SARS-CoV-2 and other Sarbecovirus currently known to infect humans.  If clinically indicated additional testing with  an alternate test  methodology [phone]) is advised. The SARS-CoV-2 RNA is generally  detectable in upper and lower respiratory sp ecimens during the acute  phase of infection. The expected result is Negative. Fact Sheet for Patients:  StrictlyIdeas.no Fact Sheet for Healthcare Providers: BankingDealers.co.za This test is not yet approved or cleared by the [place] FDA and has been authorized for detection and/or diagnosis of SARS-CoV-2 by FDA under an Emergency Use Authorization (EUA).  This EUA will remain in effect (meaning this test can be used) for the duration of the COVID-19 declaration under Section 564(b)(1) of the Act, 21 U.S.C. section 360bbb-3(b)(1),  unless the authorization is terminated or revoked sooner. Performed at [facility], [place] [address] [place], [place] [address]      Time coordinating discharge: 35 minutes  SIGNED:   Darliss Cheney, MD  [facility] 02/28/2019, 10:36 AM Pager [phone]  If 7PM-7AM, please contact night-coverage [email or web address] Password [email or web address]

## 2019-02-28 NOTE — Care Management Important Message (Signed)
Important Message  Patient Details IM Letter given to Rhea Pink RN to present to the Patient Name: Carrie [name] MRN: [id number] Date of Birth: [birth year]   Medicare Important Message Given:  Yes    Kerin Salen 02/28/2019, 10:42 AM

## 2019-03-01 ENCOUNTER — Telehealth: Payer: Self-pay | Admitting: Cardiology

## 2019-03-01 NOTE — Telephone Encounter (Signed)
Spoke with the pt about call placed earlier.  She was calling to inform us that the hospital discharged her with all her meds, but her lasix.  Pt states she has to have her lasix faxed to the [facility] at [phone], for she will get her medications at no cost.  Pt states she has 2 doses on hand, but will need this faxed soon. Asked the pt if she had any other concerns as she voiced chest discomfort to the operator.  Pt states she is having no cardiac issues at all at this time, and is finally home resting comfortably.  I informed the pt that I will have our office fax her lasix prescription to the [place] as requested, tomorrow.  Pt verbalized understanding and agrees with this plan. Pt voiced no other concerns at this time.

## 2019-03-01 NOTE — Telephone Encounter (Signed)
  Patient was seen in the hospital by Dr Johnsie Cancel and told she would be a patient of Dr Meda Coffee. She was told to take Lasix but was not given a prescription for it. She needs a script sent to [facility] to fill.  She would also like the Dr to know that she is having an ache over her left breast to her armpit and she has not had that before.

## 2019-03-01 NOTE — Telephone Encounter (Signed)
Left message to call office Chart indicates prescription was sent to [facility]. I spoke with pharmacy at [facility] and they do not take prescriptions electronically.  Has to be faxed to [phone].

## 2019-03-02 ENCOUNTER — Telehealth: Payer: Self-pay | Admitting: Pharmacist

## 2019-03-02 NOTE — Telephone Encounter (Signed)
Left message for patient to return call. Calling patient to set up lab work in 3 weeks for [employer] start/hospital discharge. Will also request patient monitor her blood pressure. Will titrate Entresto as long as BP isnt too low and labwork appropriate in 3 weeks

## 2019-03-02 NOTE — Telephone Encounter (Signed)
Faxed Lasix prescription to [place] on 5/15.

## 2019-03-04 ENCOUNTER — Inpatient Hospital Stay (HOSPITAL_COMMUNITY)
Admission: EM | Admit: 2019-03-04 | Discharge: 2019-03-06 | DRG: 312 | Disposition: A | Discharge status: Home / Self Care | Payer: Medicare HMO | Attending: Family Medicine | Admitting: Family Medicine

## 2019-03-04 ENCOUNTER — Emergency Department (HOSPITAL_COMMUNITY): Payer: Medicare HMO

## 2019-03-04 ENCOUNTER — Other Ambulatory Visit: Payer: Self-pay

## 2019-03-04 ENCOUNTER — Encounter (HOSPITAL_COMMUNITY): Payer: Self-pay | Admitting: Emergency Medicine

## 2019-03-04 ENCOUNTER — Telehealth: Payer: Self-pay | Admitting: Cardiology

## 2019-03-04 DIAGNOSIS — C8590 Non-Hodgkin lymphoma, unspecified, unspecified site: Secondary | ICD-10-CM | POA: Diagnosis present

## 2019-03-04 DIAGNOSIS — E1122 Type 2 diabetes mellitus with diabetic chronic kidney disease: Secondary | ICD-10-CM | POA: Diagnosis present

## 2019-03-04 DIAGNOSIS — Z807 Family history of other malignant neoplasms of lymphoid, hematopoietic and related tissues: Secondary | ICD-10-CM

## 2019-03-04 DIAGNOSIS — I427 Cardiomyopathy due to drug and external agent: Secondary | ICD-10-CM | POA: Diagnosis present

## 2019-03-04 DIAGNOSIS — R42 Dizziness and giddiness: Secondary | ICD-10-CM | POA: Diagnosis not present

## 2019-03-04 DIAGNOSIS — E869 Volume depletion, unspecified: Secondary | ICD-10-CM | POA: Diagnosis not present

## 2019-03-04 DIAGNOSIS — Z803 Family history of malignant neoplasm of breast: Secondary | ICD-10-CM

## 2019-03-04 DIAGNOSIS — E039 Hypothyroidism, unspecified: Secondary | ICD-10-CM | POA: Diagnosis present

## 2019-03-04 DIAGNOSIS — R0789 Other chest pain: Secondary | ICD-10-CM

## 2019-03-04 DIAGNOSIS — R0602 Shortness of breath: Secondary | ICD-10-CM | POA: Diagnosis not present

## 2019-03-04 DIAGNOSIS — R519 Headache, unspecified: Secondary | ICD-10-CM

## 2019-03-04 DIAGNOSIS — I129 Hypertensive chronic kidney disease with stage 1 through stage 4 chronic kidney disease, or unspecified chronic kidney disease: Secondary | ICD-10-CM | POA: Diagnosis present

## 2019-03-04 DIAGNOSIS — Z801 Family history of malignant neoplasm of trachea, bronchus and lung: Secondary | ICD-10-CM

## 2019-03-04 DIAGNOSIS — I951 Orthostatic hypotension: Secondary | ICD-10-CM | POA: Diagnosis not present

## 2019-03-04 DIAGNOSIS — Z7901 Long term (current) use of anticoagulants: Secondary | ICD-10-CM

## 2019-03-04 DIAGNOSIS — R51 Headache: Secondary | ICD-10-CM | POA: Diagnosis not present

## 2019-03-04 DIAGNOSIS — T451X5A Adverse effect of antineoplastic and immunosuppressive drugs, initial encounter: Secondary | ICD-10-CM | POA: Diagnosis present

## 2019-03-04 DIAGNOSIS — R55 Syncope and collapse: Secondary | ICD-10-CM | POA: Diagnosis not present

## 2019-03-04 DIAGNOSIS — Z96651 Presence of right artificial knee joint: Secondary | ICD-10-CM | POA: Diagnosis present

## 2019-03-04 DIAGNOSIS — M1990 Unspecified osteoarthritis, unspecified site: Secondary | ICD-10-CM | POA: Diagnosis present

## 2019-03-04 DIAGNOSIS — Z20828 Contact with and (suspected) exposure to other viral communicable diseases: Secondary | ICD-10-CM | POA: Diagnosis present

## 2019-03-04 DIAGNOSIS — N183 Chronic kidney disease, stage 3 (moderate): Secondary | ICD-10-CM | POA: Diagnosis present

## 2019-03-04 DIAGNOSIS — Z79890 Hormone replacement therapy (postmenopausal): Secondary | ICD-10-CM

## 2019-03-04 DIAGNOSIS — I480 Paroxysmal atrial fibrillation: Secondary | ICD-10-CM | POA: Diagnosis present

## 2019-03-04 LAB — CBC WITH DIFFERENTIAL/PLATELET
Abs Immature Granulocytes: 0.02 10*3/uL (ref 0.00–0.07)
Basophils Absolute: 0 10*3/uL (ref 0.0–0.1)
Basophils Relative: 1 %
Eosinophils Absolute: 0.2 10*3/uL (ref 0.0–0.5)
Eosinophils Relative: 3 %
HCT: 41.8 % (ref 36.0–46.0)
Hemoglobin: 13.8 g/dL (ref 12.0–15.0)
Immature Granulocytes: 0 %
Lymphocytes Relative: 6 %
Lymphs Abs: 0.4 10*3/uL — ABNORMAL LOW (ref 0.7–4.0)
MCH: 32.5 pg (ref 26.0–34.0)
MCHC: 33 g/dL (ref 30.0–36.0)
MCV: 98.4 fL (ref 80.0–100.0)
Monocytes Absolute: 0.6 10*3/uL (ref 0.1–1.0)
Monocytes Relative: 10 %
Neutro Abs: 5.3 10*3/uL (ref 1.7–7.7)
Neutrophils Relative %: 80 %
Platelets: 204 10*3/uL (ref 150–400)
RBC: 4.25 MIL/uL (ref 3.87–5.11)
RDW: 12.8 % (ref 11.5–15.5)
WBC: 6.6 10*3/uL (ref 4.0–10.5)
nRBC: 0 % (ref 0.0–0.2)

## 2019-03-04 LAB — COMPREHENSIVE METABOLIC PANEL
ALT: 14 U/L (ref 0–44)
AST: 17 U/L (ref 15–41)
Albumin: 4.5 g/dL (ref 3.5–5.0)
Alkaline Phosphatase: 74 U/L (ref 38–126)
Anion gap: 9 (ref 5–15)
BUN: 36 mg/dL — ABNORMAL HIGH (ref 8–23)
CO2: 23 mmol/L (ref 22–32)
Calcium: 9 mg/dL (ref 8.9–10.3)
Chloride: 105 mmol/L (ref 98–111)
Creatinine, Ser: 1.41 mg/dL — ABNORMAL HIGH (ref 0.44–1.00)
GFR calc Af Amer: 42 mL/min — ABNORMAL LOW (ref >60)
GFR calc non Af Amer: 36 mL/min — ABNORMAL LOW (ref >60)
Glucose, Bld: 105 mg/dL — ABNORMAL HIGH (ref 70–99)
Potassium: 4.5 mmol/L (ref 3.5–5.1)
Sodium: 137 mmol/L (ref 135–145)
Total Bilirubin: 1.2 mg/dL (ref 0.3–1.2)
Total Protein: 6.9 g/dL (ref 6.5–8.1)

## 2019-03-04 LAB — URINALYSIS, ROUTINE W REFLEX MICROSCOPIC
Bacteria, UA: NONE SEEN
Bilirubin Urine: NEGATIVE
Glucose, UA: NEGATIVE mg/dL
Hgb urine dipstick: NEGATIVE
Ketones, ur: NEGATIVE mg/dL
Nitrite: NEGATIVE
Protein, ur: NEGATIVE mg/dL
Specific Gravity, Urine: 1.011 (ref 1.005–1.030)
pH: 5 (ref 5.0–8.0)

## 2019-03-04 LAB — MAGNESIUM: Magnesium: 2.3 mg/dL (ref 1.7–2.4)

## 2019-03-04 LAB — TROPONIN I
Troponin I: <0.03 ng/mL (ref <0.03)
Troponin I: <0.03 ng/mL (ref <0.03)

## 2019-03-04 LAB — BRAIN NATRIURETIC PEPTIDE: B Natriuretic Peptide: 207.3 pg/mL — ABNORMAL HIGH (ref 0.0–100.0)

## 2019-03-04 LAB — SARS CORONAVIRUS 2 BY RT PCR (HOSPITAL ORDER, PERFORMED IN ~~LOC~~ HOSPITAL LAB): SARS Coronavirus 2: NEGATIVE

## 2019-03-04 MED ORDER — APIXABAN 5 MG PO TABS
5.0000 mg | ORAL_TABLET | Freq: Two times a day (BID) | ORAL | Status: DC
  Administered 2019-03-04 – 2019-03-06 (×4): 5 mg via ORAL
  Filled 2019-03-04 (×4): qty 1

## 2019-03-04 MED ORDER — HEPARIN SOD (PORK) LOCK FLUSH 100 UNIT/ML IV SOLN
500.0000 [IU] | Freq: Once | INTRAVENOUS | Status: DC | PRN
  Filled 2019-03-04: qty 5

## 2019-03-04 MED ORDER — BISACODYL 5 MG PO TBEC: 5.0000 mg | DELAYED_RELEASE_TABLET | Freq: Every day | ORAL | Status: DC | PRN

## 2019-03-04 MED ORDER — AMIODARONE HCL 200 MG PO TABS
200.0000 mg | ORAL_TABLET | Freq: Two times a day (BID) | ORAL | Status: DC
  Administered 2019-03-04 – 2019-03-06 (×4): 200 mg via ORAL
  Filled 2019-03-04 (×4): qty 1

## 2019-03-04 MED ORDER — ACETAMINOPHEN 500 MG PO TABS
1000.0000 mg | ORAL_TABLET | Freq: Once | ORAL | Status: AC
  Administered 2019-03-04: 1000 mg via ORAL
  Filled 2019-03-04: qty 2

## 2019-03-04 MED ORDER — POLYVINYL ALCOHOL 1.4 % OP SOLN: 1.0000 [drp] | OPHTHALMIC | Status: DC | PRN

## 2019-03-04 MED ORDER — LEVOTHYROXINE SODIUM 112 MCG PO TABS
112.0000 ug | ORAL_TABLET | Freq: Every day | ORAL | Status: DC
  Administered 2019-03-05 – 2019-03-06 (×2): 112 ug via ORAL
  Filled 2019-03-04 (×2): qty 1

## 2019-03-04 MED ORDER — ACETAMINOPHEN 500 MG PO TABS
500.0000 mg | ORAL_TABLET | Freq: Four times a day (QID) | ORAL | Status: DC | PRN
  Administered 2019-03-04: 500 mg via ORAL
  Filled 2019-03-04: qty 1

## 2019-03-04 NOTE — ED Notes (Signed)
Report given to Rita, RN.

## 2019-03-04 NOTE — ED Notes (Signed)
ED TO INPATIENT HANDOFF REPORT  ED Nurse Name and Phone #: [place] G, [phone]  S Name/Age/Gender Carrie [name] 78 y.o. female Room/Bed: WA16/WA16  Code Status   Code Status: Prior  Home/SNF/Other Home Patient oriented to: self, place, time and situation Is this baseline? Yes   Triage Complete: Triage complete  Chief Complaint weakness/ chest discomfort   Triage Note Pt reports chest pain in l/upper chest pain and shortness of breath since 10 am.Pt reports "sweating" after eating and feeling lightheaded.. Took 2 tylenol. Denies cough. Reports loose stool this am. Pt has port on r/chest. EDP at bedside. Pt is alert, oriented and conversive.   Allergies No Known Allergies  Level of Care/Admitting Diagnosis ED Disposition    ED Disposition Condition Comment   Admit  Hospital Area: [place] [[id number]]  Level of Care: Telemetry [5]  Admit to tele based on following criteria: Eval of Syncope  Covid Evaluation: N/A  Diagnosis: Postural dizziness with presyncope [0867619]  Admitting Physician: Bonnell Public [[id number]]  Attending Physician: Dana Allan I [[id number]]  PT Class (Do Not Modify): Observation [104]  PT Acc Code (Do Not Modify): Observation [10022]       B Medical/Surgery History Past Medical History:  Diagnosis Date  . A-fib ([place])   . Anemia    "years ago"  . Atrial fibrillation with rapid ventricular response ([place]) 06/25/2015  . Carpal tunnel syndrome, bilateral   . Cervical spondylosis without myelopathy 10/25/2013  . Dental bridge present   . DJD (degenerative joint disease)   . Dysrhythmia    WENT INTO A FIB IN 2017   . Elevated troponin 02/18/2015  . Follicular lymphoma grade 3a ([place]) 02/03/2015  . History of echocardiogram    Echo 12/16: EF 60-65%, no RWMA, severe LAE  . History of nuclear stress test    Myoview 1/17: EF 55%, Normal study. No ischemia or scar.  . Hypothyroid   . Nausea without vomiting 06/20/2015  .  Pneumonia   . Stage III chronic kidney disease ([place]) 07/01/2015  . Thrush of mouth and esophagus ([place]) 06/25/2015   Past Surgical History:  Procedure Laterality Date  . ABDOMINAL HYSTERECTOMY    . APPENDECTOMY    . BACK SURGERY     X5-lumbar-fusion  . COLONOSCOPY    . DILATION AND CURETTAGE OF UTERUS    . LYMPH NODE BIOPSY Right 01/21/2015   Procedure: RIGHT GROIN LYMPH NODE BIOPSY;  Surgeon: Erroll Luna, MD;  Location: [place];  Service: General;  Laterality: Right;  . MASS EXCISION Left 08/29/2018   Procedure: EXCISION LEFT BACK  MASS;  Surgeon: Erroll Luna, MD;  Location: [place];  Service: General;  Laterality: Left;  . Ovarian cyst resection    . patelar tendon transplants     Left/right  . PORTACATH PLACEMENT Right 02/13/2015   Procedure: INSERTION PORT-A-CATH WITH ULTRASOUND;  Surgeon: Erroll Luna, MD;  Location: [place];  Service: General;  Laterality: Right;  . PORTACATH PLACEMENT N/A 08/29/2018   Procedure: INSERTION PORT-A-CATH WITH ULTRA SOUND ERAS PATHWAY;  Surgeon: Erroll Luna, MD;  Location: [place];  Service: General;  Laterality: N/A;  . TOTAL KNEE ARTHROPLASTY  2011   Right  . TUBAL LIGATION       A IV Location/Drains/Wounds Patient Lines/Drains/Airways Status   Active Line/Drains/Airways    Name:   Placement date:   Placement time:   Site:   Days:   Implanted Port 02/25/19 Right Chest   02/25/19    -  Chest   7   External Urinary Catheter   03/04/19    1330    -   less than 1          Intake/Output Last 24 hours No intake or output data in the 24 hours ending 03/04/19 2015  Labs/Imaging Results for orders placed or performed during the hospital encounter of 03/04/19 (from the past 48 hour(s))  CBC with Differential/Platelet     Status: Abnormal   Collection Time: 03/04/19 12:22 PM  Result Value Ref Range   WBC 6.6 4.0 - 10.5 K/uL   RBC 4.25 3.87 - 5.11 MIL/uL   Hemoglobin 13.8 12.0 - 15.0 g/dL   HCT 41.8 36.0 - 46.0 %   MCV  98.4 80.0 - 100.0 fL   MCH 32.5 26.0 - 34.0 pg   MCHC 33.0 30.0 - 36.0 g/dL   RDW 12.8 11.5 - 15.5 %   Platelets 204 150 - 400 K/uL   nRBC 0.0 0.0 - 0.2 %   Neutrophils Relative % 80 %   Neutro Abs 5.3 1.7 - 7.7 K/uL   Lymphocytes Relative 6 %   Lymphs Abs 0.4 (L) 0.7 - 4.0 K/uL   Monocytes Relative 10 %   Monocytes Absolute 0.6 0.1 - 1.0 K/uL   Eosinophils Relative 3 %   Eosinophils Absolute 0.2 0.0 - 0.5 K/uL   Basophils Relative 1 %   Basophils Absolute 0.0 0.0 - 0.1 K/uL   Immature Granulocytes 0 %   Abs Immature Granulocytes 0.02 0.00 - 0.07 K/uL    Comment: Performed at [facility], [place] [address] [place], [place] [address]  Comprehensive metabolic panel     Status: Abnormal   Collection Time: 03/04/19 12:22 PM  Result Value Ref Range   Sodium 137 135 - 145 mmol/L   Potassium 4.5 3.5 - 5.1 mmol/L   Chloride 105 98 - 111 mmol/L   CO2 23 22 - 32 mmol/L   Glucose, Bld 105 (H) 70 - 99 mg/dL   BUN 36 (H) 8 - 23 mg/dL   Creatinine, Ser 1.41 (H) 0.44 - 1.00 mg/dL   Calcium 9.0 8.9 - 10.3 mg/dL   Total Protein 6.9 6.5 - 8.1 g/dL   Albumin 4.5 3.5 - 5.0 g/dL   AST 17 15 - 41 U/L   ALT 14 0 - 44 U/L   Alkaline Phosphatase 74 38 - 126 U/L   Total Bilirubin 1.2 0.3 - 1.2 mg/dL   GFR calc non Af Amer 36 (L) >60 mL/min   GFR calc Af Amer 42 (L) >60 mL/min   Anion gap 9 5 - 15    Comment: Performed at [facility], [place] [address], [place], [place] [address]  Brain natriuretic peptide     Status: Abnormal   Collection Time: 03/04/19 12:22 PM  Result Value Ref Range   B Natriuretic Peptide 207.3 (H) 0.0 - 100.0 pg/mL    Comment: Performed at [facility], [place] [address] [place], [place] [address]  Magnesium     Status: None   Collection Time: 03/04/19 12:22 PM  Result Value Ref Range   Magnesium 2.3 1.7 - 2.4 mg/dL    Comment: Performed at [facility], [place] [address] [place], [place] [address]   Troponin I - ONCE - STAT     Status: None   Collection Time: 03/04/19 12:22 PM  Result Value Ref Range   Troponin I <0.03 <0.03 ng/mL  Comment: Performed at [facility], [place] [address], [place], [place] [address]  Urinalysis, Routine w reflex microscopic     Status: Abnormal   Collection Time: 03/04/19  1:54 PM  Result Value Ref Range   Color, Urine STRAW (A) YELLOW   APPearance CLEAR CLEAR   Specific Gravity, Urine 1.011 1.005 - 1.030   pH 5.0 5.0 - 8.0   Glucose, UA NEGATIVE NEGATIVE mg/dL   Hgb urine dipstick NEGATIVE NEGATIVE   Bilirubin Urine NEGATIVE NEGATIVE   Ketones, ur NEGATIVE NEGATIVE mg/dL   Protein, ur NEGATIVE NEGATIVE mg/dL   Nitrite NEGATIVE NEGATIVE   Leukocytes,Ua SMALL (A) NEGATIVE   RBC / HPF 0-5 0 - 5 RBC/hpf   WBC, UA 6-10 0 - 5 WBC/hpf   Bacteria, UA NONE SEEN NONE SEEN   Squamous Epithelial / LPF 0-5 0 - 5    Comment: Performed at [facility], [place] [address] [place], [place] [address]  Troponin I - Once-Timed     Status: None   Collection Time: 03/04/19  4:44 PM  Result Value Ref Range   Troponin I <0.03 <0.03 ng/mL    Comment: Performed at [facility], [place] [address] [place], [place] [address]   Ct Head Wo Contrast  Result Date: 03/04/2019 CLINICAL DATA:  Headache and weakness for 2 weeks EXAM: CT HEAD WITHOUT CONTRAST TECHNIQUE: Contiguous axial images were obtained from the base of the skull through the vertex without intravenous contrast. COMPARISON:  October 18, 2013 FINDINGS: Brain: No subdural, epidural, or subarachnoid hemorrhage. Ventricles and sulci are unremarkable. Cerebellum, brainstem, and basal cisterns are normal. No mass effect or midline shift. Scattered white matter changes have increased since 2015 but remain moderate. No acute cortical ischemia or infarct. Vascular: Atherosclerotic changes are seen in the intracranial carotids. Skull: Normal. Negative for fracture or focal  lesion. Sinuses/Orbits: No acute finding. Other: None. IMPRESSION: 1. Increasing chronic white matter changes compared to 2015. No acute intracranial abnormalities are noted. Electronically Signed   By: Dorise Bullion III M.D   On: 03/04/2019 14:22   Dg Chest Port 1 View  Result Date: 03/04/2019 CLINICAL DATA:  Pain. EXAM: PORTABLE CHEST 1 VIEW COMPARISON:  Feb 25, 2019 FINDINGS: Stable right Port-A-Cath. No pneumothorax. Stable cardiomegaly. The hila and mediastinum are normal. No pulmonary nodules, masses, or focal infiltrates. IMPRESSION: No active disease. Electronically Signed   By: Dorise Bullion III M.D   On: 03/04/2019 13:29    Pending Labs Unresulted Labs (From admission, onward)    Start     Ordered   03/04/19 1929  SARS Coronavirus 2 (CEPHEID - Performed in [place] hospital lab), [facility] Order  (Asymptomatic Patients Labs)  Once,   R    Question:  Rule Out  Answer:  Yes   03/04/19 1928   03/04/19 1443  Urine culture  Add-on,   STAT     03/04/19 1443          Vitals/Pain Today's Vitals   03/04/19 1900 03/04/19 1930 03/04/19 2000 03/04/19 2014  BP: 122/71 131/69 120/67   Pulse: (!) 59 62 (!) 58   Resp: 19 18 18    Temp:      TempSrc:      SpO2: 94% 97% 95%   Weight:      PainSc:    0-No pain    Isolation Precautions No active isolations  Medications Medications  heparin lock flush 100 unit/mL (has no administration in time range)  acetaminophen (TYLENOL) tablet 1,000 mg (1,000 mg Oral Given 03/04/19 1515)    Mobility walks Low fall risk

## 2019-03-04 NOTE — ED Notes (Signed)
Patient transported to CT 

## 2019-03-04 NOTE — H&P (Signed)
History and Physical  NUMA HEATWOLE ULA:[id number] DOB: April 12, 1941 DOA: 03/04/2019  Referring physician: ER provider PCP: Reubin Milan, MD  Outpatient Specialists: Cardiology team Patient coming from: Home  Chief Complaint: Near syncope  HPI: Patient is a 78 year old Caucasian female past medical history significant for cardiomyopathy with EF of 20 to 25% following chemotherapy for non-Hodgkin's lymphoma according to patient, paroxysmal atrial fibrillation, chronic kidney disease stage III amongst other medical problems.  According to the patient, she was discharged from this hospital about 3 days ago and more cardiac medications were added to her cardiac regimen.  Patient reports that she has been feeling weak for over 1 to 2 weeks.  Patient experienced a near syncopal episode earlier today after bowel movement.  Patient described diaphoresis and sweats, but no chest pain no palpitations.  Patient was said to be hypotensive, but this seems to have responded to volume resuscitation.  No fever or chills, no headache, no neck pain, vague history of chest pain, no GI symptoms and no urinary symptoms.  Troponin is negative.  ER provider, Dr. Vanita Panda, has asked the hospitalist team to observe patient overnight at the hospital.    ED Course: Patient has been volume resuscitated.  Troponin is negative.  Patient continues to go in and out of atrial fibrillation, but rate controlled.  Hospitalist team has been asked to observe patient overnight.  Pertinent labs: Sodium is 137, potassium of 4.5, chloride 105, CO2 23, BUN of 36, serum creatinine of 1.41 and blood sugar of 105.  Cardiac BNP is 207.  Troponin is less than 0.03 on 2 occasions.  CBC reveals WBC of 6.6, hemoglobin of 13.8, hematocrit of 41.8, MCV of 90.4 with platelet count of 204.  EKG: Independently reviewed.   Imaging: independently reviewed.  Chest x-ray has not shown any acute abnormality.  CT scan of the head without contrast has not  shown any acute abnormalities.  Review of Systems:  Negative for fever, visual changes, sore throat, rash, new muscle aches, chest pain, SOB, dysuria, bleeding, n/v/abdominal pain.  Past Medical History:  Diagnosis Date  . A-fib ([place])   . Anemia    "years ago"  . Atrial fibrillation with rapid ventricular response ([place]) 06/25/2015  . Carpal tunnel syndrome, bilateral   . Cervical spondylosis without myelopathy 10/25/2013  . Dental bridge present   . DJD (degenerative joint disease)   . Dysrhythmia    WENT INTO A FIB IN 2017   . Elevated troponin 02/18/2015  . Follicular lymphoma grade 3a ([place]) 02/03/2015  . History of echocardiogram    Echo 12/16: EF 60-65%, no RWMA, severe LAE  . History of nuclear stress test    Myoview 1/17: EF 55%, Normal study. No ischemia or scar.  . Hypothyroid   . Nausea without vomiting 06/20/2015  . Pneumonia   . Stage III chronic kidney disease ([place]) 07/01/2015  . Thrush of mouth and esophagus ([place]) 06/25/2015    Past Surgical History:  Procedure Laterality Date  . ABDOMINAL HYSTERECTOMY    . APPENDECTOMY    . BACK SURGERY     X5-lumbar-fusion  . COLONOSCOPY    . DILATION AND CURETTAGE OF UTERUS    . LYMPH NODE BIOPSY Right 01/21/2015   Procedure: RIGHT GROIN LYMPH NODE BIOPSY;  Surgeon: Erroll Luna, MD;  Location: [facility];  Service: General;  Laterality: Right;  . MASS EXCISION Left 08/29/2018   Procedure: EXCISION LEFT BACK  MASS;  Surgeon: Erroll Luna, MD;  Location: [facility]  OR;  Service: General;  Laterality: Left;  . Ovarian cyst resection    . patelar tendon transplants     Left/right  . PORTACATH PLACEMENT Right 02/13/2015   Procedure: INSERTION PORT-A-CATH WITH ULTRASOUND;  Surgeon: Erroll Luna, MD;  Location: [facility];  Service: General;  Laterality: Right;  . PORTACATH PLACEMENT N/A 08/29/2018   Procedure: INSERTION PORT-A-CATH WITH ULTRA SOUND ERAS PATHWAY;  Surgeon: Erroll Luna, MD;  Location: [facility];  Service: General;   Laterality: N/A;  . TOTAL KNEE ARTHROPLASTY  2011   Right  . TUBAL LIGATION       reports that she has never smoked. She has never used smokeless tobacco. She reports current alcohol use. She reports that she does not use drugs.  No Known Allergies  Family History  Problem Relation Age of Onset  . Cancer Mother        Breast, lung NHL  . Cancer Sister        Multiple myeloma     Prior to Admission medications   Medication Sig Start Date End Date Taking? Authorizing Provider  acetaminophen (TYLENOL) 500 MG tablet Take 500 mg by mouth every 6 (six) hours as needed for moderate pain or headache.   Yes [provider]  amiodarone (PACERONE) 200 MG tablet Take 1 tablet (200 mg total) by mouth 2 (two) times daily. 02/28/19  Yes Pahwani, Einar Grad, MD  apixaban (ELIQUIS) 5 MG TABS tablet Take 5 mg by mouth 2 (two) times daily.    Yes [provider]  bisacodyl (DULCOLAX) 5 MG EC tablet Take 5 mg by mouth daily as needed for moderate constipation.   Yes [provider]  carvedilol (COREG) 3.125 MG tablet Take 1 tablet (3.125 mg total) by mouth 2 (two) times daily with a meal. 02/28/19  Yes Pahwani, Einar Grad, MD  furosemide (LASIX) 20 MG tablet Take 0.5 tablets (10 mg total) by mouth daily. 03/01/19  Yes Pahwani, Einar Grad, MD  levothyroxine (SYNTHROID, LEVOTHROID) 112 MCG tablet Take 112 mcg by mouth daily.  04/22/15  Yes [provider]  polyvinyl alcohol (LIQUIFILM TEARS) 1.4 % ophthalmic solution Place 1 drop into both eyes as needed for dry eyes.   Yes [provider]  sacubitril-valsartan (ENTRESTO) 24-26 MG Take 1 tablet by mouth 2 (two) times daily. 02/28/19  Yes Pahwani, Einar Grad, MD  acyclovir (ZOVIRAX) 400 MG tablet Take 1 tablet (400 mg total) by mouth daily. Patient not taking: Reported on 01/30/2019 09/04/18   Nicholas Lose, MD  cephALEXin (KEFLEX) 500 MG capsule Take 1 capsule (500 mg total) by mouth 3 (three) times daily. Patient not taking: Reported on  02/25/2019 01/30/19   Varney Biles, MD  diclofenac sodium (VOLTAREN) 1 % GEL Apply 2 g topically 3 (three) times daily as needed (knee pain). Patient not taking: Reported on 02/25/2019 08/01/18   Quintella Reichert, MD  lidocaine-prilocaine (EMLA) cream Apply to affected area once Patient not taking: Reported on 02/25/2019 09/07/18   Nicholas Lose, MD  LORazepam (ATIVAN) 0.5 MG tablet Take 1 tablet (0.5 mg total) by mouth every 6 (six) hours as needed (Nausea or vomiting). Patient not taking: Reported on 02/25/2019 09/04/18   Nicholas Lose, MD  omeprazole (PRILOSEC) 40 MG capsule Take 1 capsule (40 mg total) by mouth daily. Patient not taking: Reported on 01/30/2019 09/19/18   [name], MD  ondansetron (ZOFRAN ODT) 8 MG disintegrating tablet Take 1 tablet (8 mg total) by mouth every 8 (eight) hours as needed for nausea. Patient not taking:  Reported on 02/25/2019 01/30/19   Varney Biles, MD  ondansetron (ZOFRAN) 8 MG tablet Take 1 tablet (8 mg total) by mouth 2 (two) times daily as needed for refractory nausea / vomiting. Start on day 2 after bendamustine chemotherapy. Patient not taking: Reported on 02/16/2019 09/07/18   Nicholas Lose, MD  potassium chloride SA (K-DUR) 20 MEQ tablet Take 1 tablet (20 mEq total) by mouth 2 (two) times daily. Patient not taking: Reported on 02/25/2019 02/17/19   Varney Biles, MD  prochlorperazine (COMPAZINE) 10 MG tablet Take 1 tablet (10 mg total) by mouth every 6 (six) hours as needed (Nausea or vomiting). 09/07/18   Nicholas Lose, MD    Physical Exam: Vitals:   03/04/19 1730 03/04/19 1800 03/04/19 1830 03/04/19 1900  BP: 127/78 129/79 130/80 122/71  Pulse: 65 (!) 53 61 (!) 59  Resp: (!) 22 (!) _0 Temp:      TempSrc:      SpO2: 99% 99% 97% 94%  Weight:        Constitutional:  . Appears calm and comfortable Eyes:  . No pallor. No jaundice.  ENMT:  . external ears, nose appear normal.  Dry buccal mucosa. Neck:  . Neck is supple. No JVD  Respiratory:  . CTA bilaterally, no w/r/r.  . Respiratory effort normal. No retractions or accessory muscle use Cardiovascular:  . S1S2 . No LE extremity edema   Abdomen:  . Abdomen is soft and non tender. Organs are difficult to assess. Neurologic:  . Awake and alert. . Moves all limbs.  Wt Readings from Last 3 Encounters:  03/04/19 60.9 kg  02/28/19 61.6 kg  02/16/19 64 kg    I have personally reviewed following labs and imaging studies  Labs on Admission:  CBC: Recent Labs  Lab 02/26/19 0403 03/04/19 1222  WBC 5.4 6.6  NEUTROABS  --  5.3  HGB 12.1 13.8  HCT 36.7 41.8  MCV 99.2 98.4  PLT 206 415   Basic Metabolic Panel: Recent Labs  Lab 02/26/19 0403 02/26/19 1056 02/27/19 0501 02/28/19 0356 03/04/19 1222  NA 140 138 136 138 137  K 3.7 3.3* 3.6 3.7 4.5  CL 106 103 102 105 105  CO2 _1 GLUCOSE 123* 92 175* 111* 105*  BUN 31* 27* 28* 28* 36*  CREATININE 1.29* 1.28* 1.29* 1.31* 1.41*  CALCIUM 8.4* 8.4* 8.4* 8.5* 9.0  MG 1.9  --   --   --  2.3   Liver Function Tests: Recent Labs  Lab 03/04/19 1222  AST 17  ALT 14  ALKPHOS 74  BILITOT 1.2  PROT 6.9  ALBUMIN 4.5   No results for input(s): LIPASE, AMYLASE in the last 168 hours. No results for input(s): AMMONIA in the last 168 hours. Coagulation Profile: No results for input(s): INR, PROTIME in the last 168 hours. Cardiac Enzymes: Recent Labs  Lab 03/04/19 1222 03/04/19 1644  TROPONINI <0.03 <0.03   BNP (last 3 results) No results for input(s): PROBNP in the last 8760 hours. HbA1C: No results for input(s): HGBA1C in the last 72 hours. CBG: No results for input(s): GLUCAP in the last 168 hours. Lipid Profile: No results for input(s): CHOL, HDL, LDLCALC, TRIG, CHOLHDL, LDLDIRECT in the last 72 hours. Thyroid Function Tests: No results for input(s): TSH, T4TOTAL, FREET4, T3FREE, THYROIDAB in the last 72 hours. Anemia Panel: No results for input(s): VITAMINB12, FOLATE, FERRITIN,  TIBC, IRON, RETICCTPCT in the last 72 hours. Urine analysis:  Component Value Date/Time   COLORURINE STRAW (A) 03/04/2019 1354   APPEARANCEUR CLEAR 03/04/2019 1354   LABSPEC 1.011 03/04/2019 1354   LABSPEC 1.005 03/18/2017 1315   PHURINE 5.0 03/04/2019 1354   GLUCOSEU NEGATIVE 03/04/2019 1354   GLUCOSEU Negative 03/18/2017 1315   HGBUR NEGATIVE 03/04/2019 [place] 03/04/2019 1354   BILIRUBINUR Negative 03/18/2017 1315   [place] 03/04/2019 1354   PROTEINUR NEGATIVE 03/04/2019 1354   UROBILINOGEN 0.2 03/18/2017 1315   NITRITE NEGATIVE 03/04/2019 1354   LEUKOCYTESUR SMALL (A) 03/04/2019 1354   LEUKOCYTESUR Negative 03/18/2017 1315   Sepsis Labs: _0 (procalcitonin:4,lacticidven:4) ) Recent Results (from the past 240 hour(s))  SARS Coronavirus 2 (CEPHEID - Performed in [place] hospital lab), Hosp Order     Status: None   Collection Time: 02/25/19  2:30 PM  Result Value Ref Range Status   SARS Coronavirus 2 NEGATIVE NEGATIVE Final    Comment: (NOTE) If result is NEGATIVE SARS-CoV-2 target nucleic acids are NOT DETECTED. The SARS-CoV-2 RNA is generally detectable in upper and lower  respiratory specimens during the acute phase of infection. The lowest  concentration of SARS-CoV-2 viral copies this assay can detect is 250  copies / mL. A negative result does not preclude SARS-CoV-2 infection  and should not be used as the sole basis for treatment or other  patient management decisions.  A negative result may occur with  improper specimen collection / handling, submission of specimen other  than nasopharyngeal swab, presence of viral mutation(s) within the  areas targeted by this assay, and inadequate number of viral copies  (<250 copies / mL). A negative result must be combined with clinical  observations, patient history, and epidemiological information. If result is POSITIVE SARS-CoV-2 target nucleic acids are DETECTED. The SARS-CoV-2  RNA is generally detectable in upper and lower  respiratory specimens dur ing the acute phase of infection.  Positive  results are indicative of active infection with SARS-CoV-2.  Clinical  correlation with patient history and other diagnostic information is  necessary to determine patient infection status.  Positive results do  not rule out bacterial infection or co-infection with other viruses. If result is PRESUMPTIVE POSTIVE SARS-CoV-2 nucleic acids MAY BE PRESENT.   A presumptive positive result was obtained on the submitted specimen  and confirmed on repeat testing.  While 2019 novel coronavirus  (SARS-CoV-2) nucleic acids may be present in the submitted sample  additional confirmatory testing may be necessary for epidemiological  and / or clinical management purposes  to differentiate between  SARS-CoV-2 and other Sarbecovirus currently known to infect humans.  If clinically indicated additional testing with an alternate test  methodology [phone]) is advised. The SARS-CoV-2 RNA is generally  detectable in upper and lower respiratory sp ecimens during the acute  phase of infection. The expected result is Negative. Fact Sheet for Patients:  StrictlyIdeas.no Fact Sheet for Healthcare Providers: BankingDealers.co.za This test is not yet approved or cleared by the [place] FDA and has been authorized for detection and/or diagnosis of SARS-CoV-2 by FDA under an Emergency Use Authorization (EUA).  This EUA will remain in effect (meaning this test can be used) for the duration of the COVID-19 declaration under Section 564(b)(1) of the Act, 21 U.S.C. section 360bbb-3(b)(1), unless the authorization is terminated or revoked sooner. Performed at [facility], [place] [address], [place], [place] [address]       Radiological Exams on Admission: Ct Head Wo Contrast  Result Date: 03/04/2019 CLINICAL DATA:  Headache  and weakness for 2 weeks EXAM: CT HEAD WITHOUT CONTRAST TECHNIQUE: Contiguous axial images were obtained from the base of the skull through the vertex without intravenous contrast. COMPARISON:  October 18, 2013 FINDINGS: Brain: No subdural, epidural, or subarachnoid hemorrhage. Ventricles and sulci are unremarkable. Cerebellum, brainstem, and basal cisterns are normal. No mass effect or midline shift. Scattered white matter changes have increased since 2015 but remain moderate. No acute cortical ischemia or infarct. Vascular: Atherosclerotic changes are seen in the intracranial carotids. Skull: Normal. Negative for fracture or focal lesion. Sinuses/Orbits: No acute finding. Other: None. IMPRESSION: 1. Increasing chronic white matter changes compared to 2015. No acute intracranial abnormalities are noted. Electronically Signed   By: Dorise Bullion III M.D   On: 03/04/2019 14:22   Dg Chest Port 1 View  Result Date: 03/04/2019 CLINICAL DATA:  Pain. EXAM: PORTABLE CHEST 1 VIEW COMPARISON:  Feb 25, 2019 FINDINGS: Stable right Port-A-Cath. No pneumothorax. Stable cardiomegaly. The hila and mediastinum are normal. No pulmonary nodules, masses, or focal infiltrates. IMPRESSION: No active disease. Electronically Signed   By: Dorise Bullion III M.D   On: 03/04/2019 13:29    EKG: Independently reviewed.   Active Problems:   * No active hospital problems. *   Assessment/Plan Near syncope: Possibly vasovagal May be related to hypotension and volume depletion Observe patient in the hospital overnight Gentle hydration Low threshold to consult cardiology team in the morning Troponin is nonrevealing Patient is asymptomatic at the moment. Other management will depend on hospital course.  Paroxysmal atrial fibrillation: Rate controlled Continue anticoagulation.  Cardiomyopathy with EF of 20 to 25%: Reported to be secondary to chemotherapy for non-Hodgkin's lymphoma No symptoms of heart failure  Mild  volume depletion: Cautious hydration  Hypotension: Optimize cardiac medications.  Further management depend on hospital course.   DVT prophylaxis: Apixaban Code Status: Full Family Communication:  Disposition Plan: Likely home in a.m. Consults called: Low threshold to consult cardiology Admission status: Observation  Time spent: 65 minutes  Dana Allan, MD  [facility] Pager #: [phone] 7PM-7AM contact night coverage as above  03/04/2019, 7:29 PM

## 2019-03-04 NOTE — Telephone Encounter (Signed)
Patient called stating that she woke up not feeling well and had a bowel movement and got very weak and almost passed out. She now feels very weak and her BP is 99/82mmHg and she says that her BP has been low all week since discharge from the hospital.  I have instructed her to hold further carvedilol and Entresto and call the office on Monday for further instructions on meds. I have encouraged her to drink some fluids and rest and if sx dont improve to call me back. She agrees to plan. I suspect she had a vagal reaction while going to the bathroom.

## 2019-03-04 NOTE — ED Provider Notes (Signed)
[facility] DEPT Provider Note   CSN: [id number] Arrival date & time: 03/04/19  1149    History   Chief Complaint Chief Complaint  Patient presents with   Chest Pain   Shortness of Breath   Dizziness    HPI Carrie [name] is a 78 y.o. female.     HPI Patient recently admitted for atrial fibrillation with RVR and CHF.  States she is been compliant with her medications since discharge.  She had episode of loose stool this morning and became lightheaded, diaphoretic and nauseated.  She states she developed left-sided chest pain and shortness of breath.  States her shortness of breath is worse with any exertion.  She denies cough, fever or chills.  She denies any trauma.  No focal weakness or numbness.  She continues to feel short of breath with minimal left-sided chest pain.  States that she took her blood pressure during this time and it was low. Past Medical History:  Diagnosis Date   A-fib ([place])    Anemia    "years ago"   Atrial fibrillation with rapid ventricular response ([place]) 06/25/2015   Carpal tunnel syndrome, bilateral    Cervical spondylosis without myelopathy 10/25/2013   Dental bridge present    DJD (degenerative joint disease)    Dysrhythmia    WENT INTO A FIB IN 2017    Elevated troponin 10/21/4313   Follicular lymphoma grade 3a ([place]) 02/03/2015   History of echocardiogram    Echo 12/16: EF 60-65%, no RWMA, severe LAE   History of nuclear stress test    Myoview 1/17: EF 55%, Normal study. No ischemia or scar.   Hypothyroid    Nausea without vomiting 06/20/2015   Pneumonia    Stage III chronic kidney disease ([employer]) 07/01/2015   Thrush of mouth and esophagus ([place]) 06/25/2015    Patient Active Problem List   Diagnosis Date Noted   Syncope 03/05/2019   Postural dizziness with presyncope 03/04/2019   Atypical chest pain    Near syncope    Acute systolic congestive heart failure (HCC)    Atrial fibrillation with  RVR ([place]) 02/25/2019   GERD with esophagitis 09/19/2018   Skin lesions 06/06/2017   Dyspnea on exertion 03/25/2017   Anxiety disorder 03/25/2017   Generalized weakness 03/19/2017   Insomnia disorder 03/19/2017   Dysuria 08/31/2016   Acute back pain with sciatica, right 08/31/2016   Chronic back pain 07/02/2016   Quality of life palliative care encounter 03/02/2016   Port catheter in place 03/01/2016   Chronic atrial fibrillation 09/08/2015   Essential hypertension 07/02/2015   Diabetes mellitus, likely due to steroids 06/27/2015   Chronic kidney disease, stage III (moderate) ([place]) 06/25/2015   PAF (paroxysmal atrial fibrillation) ([place]) 06/25/2015   Atrial fibrillation with rapid ventricular response ([employer]) 06/25/2015   Other fatigue 40/05/6760   Grade 3a follicular lymphoma of lymph nodes of multiple regions ([place]) 02/03/2015   Hypothyroidism 09/09/2009    Past Surgical History:  Procedure Laterality Date   ABDOMINAL HYSTERECTOMY     APPENDECTOMY     BACK SURGERY     X5-lumbar-fusion   COLONOSCOPY     DILATION AND CURETTAGE OF UTERUS     LYMPH NODE BIOPSY Right 01/21/2015   Procedure: RIGHT GROIN LYMPH NODE BIOPSY;  Surgeon: Erroll Luna, MD;  Location: [place];  Service: General;  Laterality: Right;   MASS EXCISION Left 08/29/2018   Procedure: EXCISION LEFT BACK  MASS;  Surgeon: Erroll Luna,  MD;  Location: [facility];  Service: General;  Laterality: Left;   Ovarian cyst resection     patelar tendon transplants     Left/right   PORTACATH PLACEMENT Right 02/13/2015   Procedure: INSERTION PORT-A-CATH WITH ULTRASOUND;  Surgeon: Erroll Luna, MD;  Location: [facility];  Service: General;  Laterality: Right;   PORTACATH PLACEMENT N/A 08/29/2018   Procedure: INSERTION PORT-A-CATH WITH ULTRA SOUND ERAS PATHWAY;  Surgeon: Erroll Luna, MD;  Location: [place] [place];  Service: General;  Laterality: N/A;   TOTAL KNEE ARTHROPLASTY  2011   Right    TUBAL LIGATION       OB History   No obstetric history on file.      Home Medications    Prior to Admission medications   Medication Sig Start Date End Date Taking? Authorizing Provider  acetaminophen (TYLENOL) 500 MG tablet Take 500 mg by mouth every 6 (six) hours as needed for moderate pain or headache.   Yes [provider]  amiodarone (PACERONE) 200 MG tablet Take 1 tablet (200 mg total) by mouth 2 (two) times daily. 02/28/19  Yes Pahwani, Einar Grad, MD  apixaban (ELIQUIS) 5 MG TABS tablet Take 5 mg by mouth 2 (two) times daily.    Yes [provider]  bisacodyl (DULCOLAX) 5 MG EC tablet Take 5 mg by mouth daily as needed for moderate constipation.   Yes [provider]  carvedilol (COREG) 3.125 MG tablet Take 1 tablet (3.125 mg total) by mouth 2 (two) times daily with a meal. 02/28/19  Yes Pahwani, Einar Grad, MD  furosemide (LASIX) 20 MG tablet Take 0.5 tablets (10 mg total) by mouth daily. 03/01/19  Yes Pahwani, Einar Grad, MD  levothyroxine (SYNTHROID, LEVOTHROID) 112 MCG tablet Take 112 mcg by mouth daily.  04/22/15  Yes [provider]  polyvinyl alcohol (LIQUIFILM TEARS) 1.4 % ophthalmic solution Place 1 drop into both eyes as needed for dry eyes.   Yes [provider]  sacubitril-valsartan (ENTRESTO) 24-26 MG Take 1 tablet by mouth 2 (two) times daily. 02/28/19  Yes Pahwani, Einar Grad, MD  acyclovir (ZOVIRAX) 400 MG tablet Take 1 tablet (400 mg total) by mouth daily. Patient not taking: Reported on 01/30/2019 09/04/18   Nicholas Lose, MD  cephALEXin (KEFLEX) 500 MG capsule Take 1 capsule (500 mg total) by mouth 3 (three) times daily. Patient not taking: Reported on 02/25/2019 01/30/19   Varney Biles, MD  diclofenac sodium (VOLTAREN) 1 % GEL Apply 2 g topically 3 (three) times daily as needed (knee pain). Patient not taking: Reported on 02/25/2019 08/01/18   Quintella Reichert, MD  lidocaine-prilocaine (EMLA) cream Apply to affected area once Patient not taking:  Reported on 02/25/2019 09/07/18   Nicholas Lose, MD  LORazepam (ATIVAN) 0.5 MG tablet Take 1 tablet (0.5 mg total) by mouth every 6 (six) hours as needed (Nausea or vomiting). Patient not taking: Reported on 02/25/2019 09/04/18   Nicholas Lose, MD  omeprazole (PRILOSEC) 40 MG capsule Take 1 capsule (40 mg total) by mouth daily. Patient not taking: Reported on 01/30/2019 09/19/18   [name], MD  ondansetron (ZOFRAN ODT) 8 MG disintegrating tablet Take 1 tablet (8 mg total) by mouth every 8 (eight) hours as needed for nausea. Patient not taking: Reported on 02/25/2019 01/30/19   Varney Biles, MD  ondansetron (ZOFRAN) 8 MG tablet Take 1 tablet (8 mg total) by mouth 2 (two) times daily as needed for refractory nausea / vomiting. Start on day 2 after bendamustine chemotherapy. Patient not taking: Reported on  02/16/2019 09/07/18   Nicholas Lose, MD  potassium chloride SA (K-DUR) 20 MEQ tablet Take 1 tablet (20 mEq total) by mouth 2 (two) times daily. Patient not taking: Reported on 02/25/2019 02/17/19   Varney Biles, MD  prochlorperazine (COMPAZINE) 10 MG tablet Take 1 tablet (10 mg total) by mouth every 6 (six) hours as needed (Nausea or vomiting). 09/07/18   Nicholas Lose, MD    Family History Family History  Problem Relation Age of Onset   Cancer Mother        Breast, lung NHL   Cancer Sister        Multiple myeloma    Social History Social History   Tobacco Use   Smoking status: Never Smoker   Smokeless tobacco: Never Used  Substance Use Topics   Alcohol use: Yes    Comment: occassional wine   Drug use: No     Allergies   Patient has no known allergies.   Review of Systems Review of Systems  Constitutional: Positive for diaphoresis and fatigue. Negative for chills and fever.  HENT: Negative for sore throat and trouble swallowing.   Eyes: Negative for visual disturbance.  Respiratory: Positive for shortness of breath. Negative for cough.   Cardiovascular: Positive for  chest pain. Negative for palpitations and leg swelling.  Gastrointestinal: Positive for diarrhea and nausea. Negative for abdominal pain, blood in stool, constipation and vomiting.  Genitourinary: Negative for dysuria and flank pain.  Musculoskeletal: Negative for back pain, myalgias and neck pain.  Skin: Negative for rash and wound.  Neurological: Positive for light-headedness and headaches. Negative for syncope, weakness and numbness.  All other systems reviewed and are negative.    Physical Exam Updated Vital Signs BP 136/89 (BP Location: Right Arm)    Pulse 64    Temp 97.9 F (36.6 C) (Oral)    Resp 19    Ht '5\' 1"'  (1.549 m)    Wt 62.4 kg    SpO2 98%    BMI 25.99 kg/m   Physical Exam Vitals signs and nursing note reviewed.  Constitutional:      General: She is not in acute distress.    Appearance: Normal appearance. She is well-developed. She is not ill-appearing.  HENT:     Head: Normocephalic and atraumatic.     Nose: Nose normal.     Mouth/Throat:     Mouth: Mucous membranes are moist.  Eyes:     Pupils: Pupils are equal, round, and reactive to light.  Neck:     Musculoskeletal: Normal range of motion and neck supple. No neck rigidity or muscular tenderness.  Cardiovascular:     Rate and Rhythm: Regular rhythm. Bradycardia present.     Heart sounds: No murmur. No friction rub. No gallop.   Pulmonary:     Effort: Pulmonary effort is normal.     Breath sounds: Rales present.     Comments: No respiratory distress.  Few rales in bilateral bases. Abdominal:     General: Bowel sounds are normal.     Palpations: Abdomen is soft.     Tenderness: There is no abdominal tenderness. There is no guarding or rebound.  Musculoskeletal: Normal range of motion.        General: No swelling, tenderness, deformity or signs of injury.     Right lower leg: No edema.     Left lower leg: No edema.     Comments: Distal pulses intact.  No midline thoracic or lumbar tenderness.  No CVA  tenderness.  Lymphadenopathy:     Cervical: No cervical adenopathy.  Skin:    General: Skin is warm and dry.     Capillary Refill: Capillary refill takes less than 2 seconds.     Findings: No erythema or rash.  Neurological:     General: No focal deficit present.     Mental Status: She is alert and oriented to person, place, and time.     Comments: Patient is alert and oriented x3 with clear, goal oriented speech. Patient has 5/5 motor in all extremities. Sensation is intact to light touch. Bilateral finger-to-nose is normal with no signs of dysmetria.  Psychiatric:        Behavior: Behavior normal.      ED Treatments / Results  Labs (all labs ordered are listed, but only abnormal results are displayed) Labs Reviewed  CBC WITH DIFFERENTIAL/PLATELET - Abnormal; Notable for the following components:      Result Value   Lymphs Abs 0.4 (*)    All other components within normal limits  COMPREHENSIVE METABOLIC PANEL - Abnormal; Notable for the following components:   Glucose, Bld 105 (*)    BUN 36 (*)    Creatinine, Ser 1.41 (*)    GFR calc non Af Amer 36 (*)    GFR calc Af Amer 42 (*)    All other components within normal limits  BRAIN NATRIURETIC PEPTIDE - Abnormal; Notable for the following components:   B Natriuretic Peptide 207.3 (*)    All other components within normal limits  URINALYSIS, ROUTINE W REFLEX MICROSCOPIC - Abnormal; Notable for the following components:   Color, Urine STRAW (*)    Leukocytes,Ua SMALL (*)    All other components within normal limits  BASIC METABOLIC PANEL - Abnormal; Notable for the following components:   BUN 31 (*)    Creatinine, Ser 1.38 (*)    Calcium 8.8 (*)    GFR calc non Af Amer 37 (*)    GFR calc Af Amer 43 (*)    All other components within normal limits  CBC - Abnormal; Notable for the following components:   WBC 3.8 (*)    All other components within normal limits  URINE CULTURE  SARS CORONAVIRUS 2 (HOSPITAL ORDER, [facility])  MAGNESIUM  TROPONIN I  TROPONIN I  MAGNESIUM  PHOSPHORUS  TSH    EKG EKG Interpretation  Date/Time:  Sunday Mar 04 2019 12:03:01 EDT Ventricular Rate:  59 PR Interval:    QRS Duration: 105 QT Interval:  461 QTC Calculation: 457 R Axis:   -2 Text Interpretation:  Sinus rhythm Abnormal T, consider ischemia, lateral leads Confirmed by Julianne Rice [phone]) on 03/04/2019 12:28:03 PM   Radiology Ct Head Wo Contrast  Result Date: 03/04/2019 CLINICAL DATA:  Headache and weakness for 2 weeks EXAM: CT HEAD WITHOUT CONTRAST TECHNIQUE: Contiguous axial images were obtained from the base of the skull through the vertex without intravenous contrast. COMPARISON:  October 18, 2013 FINDINGS: Brain: No subdural, epidural, or subarachnoid hemorrhage. Ventricles and sulci are unremarkable. Cerebellum, brainstem, and basal cisterns are normal. No mass effect or midline shift. Scattered white matter changes have increased since 2015 but remain moderate. No acute cortical ischemia or infarct. Vascular: Atherosclerotic changes are seen in the intracranial carotids. Skull: Normal. Negative for fracture or focal lesion. Sinuses/Orbits: No acute finding. Other: None. IMPRESSION: 1. Increasing chronic white matter changes compared to 2015. No acute intracranial abnormalities are noted. Electronically Signed  By: Dorise Bullion III M.D   On: 03/04/2019 14:22   Dg Chest Port 1 View  Result Date: 03/04/2019 CLINICAL DATA:  Pain. EXAM: PORTABLE CHEST 1 VIEW COMPARISON:  Feb 25, 2019 FINDINGS: Stable right Port-A-Cath. No pneumothorax. Stable cardiomegaly. The hila and mediastinum are normal. No pulmonary nodules, masses, or focal infiltrates. IMPRESSION: No active disease. Electronically Signed   By: Dorise Bullion III M.D   On: 03/04/2019 13:29    Procedures Procedures (including critical care time)  Medications Ordered in ED Medications  heparin lock flush 100 unit/mL (has  no administration in time range)  acetaminophen (TYLENOL) tablet 500 mg (500 mg Oral Given 03/04/19 2324)  amiodarone (PACERONE) tablet 200 mg (200 mg Oral Given 03/05/19 1036)  levothyroxine (SYNTHROID) tablet 112 mcg (112 mcg Oral Given 03/05/19 0609)  bisacodyl (DULCOLAX) EC tablet 5 mg (has no administration in time range)  apixaban (ELIQUIS) tablet 5 mg (5 mg Oral Given 03/05/19 1036)  polyvinyl alcohol (LIQUIFILM TEARS) 1.4 % ophthalmic solution 1 drop (has no administration in time range)  sodium chloride flush (NS) 0.9 % injection 10-40 mL (has no administration in time range)  sacubitril-valsartan (ENTRESTO) 24-26 mg per tablet (has no administration in time range)  carvedilol (COREG) tablet 3.125 mg (has no administration in time range)  acetaminophen (TYLENOL) tablet 1,000 mg (1,000 mg Oral Given 03/04/19 1515)     Initial Impression / Assessment and Plan / ED Course  I have reviewed the triage vital signs and the nursing notes.  Pertinent labs & imaging results that were available during my care of the patient were reviewed by me and considered in my medical decision making (see chart for details).       Vital signs are stable in the emergency department.  Initial troponin is normal.  Low suspicion of PE given patient is on blood thinner.  Given she is complaining of headache will get CT head to rule out intracranial bleed.  Discussed with Dr. Radford Pax.  Has been in contact with the patient.  Agrees with delta troponin and will follow-up closely as an outpatient. Signed out to oncoming emergency physician pending repeat troponin.  Final Clinical Impressions(s) / ED Diagnoses   Final diagnoses:  Near syncope  Nonintractable episodic headache, unspecified headache type  Atypical chest pain    ED Discharge Orders    None       Julianne Rice, MD 03/05/19 1615

## 2019-03-04 NOTE — ED Notes (Signed)
EDP at bedside  

## 2019-03-04 NOTE — ED Notes (Signed)
Graham crackers and peanut butter provided at pts request

## 2019-03-04 NOTE — ED Provider Notes (Signed)
I evaluated the patient after second troponin result was available. Subsequently discussed her results with her, and her daughter-in-law cardiac nurse at length. Patient's evaluation here generally reassuring, and in comparison to recent lab studies, her BNP is diminished appropriately, troponin remains normal after 2 events, reassuring for absence of likely ischemic cause for today's episode of near syncope and hypertension. Family has a particularly strong preference for the patient being monitored overnight.  With some consideration of iatrogenic hypotension/overdiuresis, contributing, possibly exacerbating a vagal episode, the patient will be monitored, admitted for further evaluation.   Carmin Muskrat, MD 03/04/19 [phone]

## 2019-03-04 NOTE — ED Triage Notes (Signed)
Pt reports chest pain in l/upper chest pain and shortness of breath since 10 am.Pt reports "sweating" after eating and feeling lightheaded.. Took 2 tylenol. Denies cough. Reports loose stool this am. Pt has port on r/chest. EDP at bedside. Pt is alert, oriented and conversive.

## 2019-03-05 DIAGNOSIS — Z803 Family history of malignant neoplasm of breast: Secondary | ICD-10-CM | POA: Diagnosis not present

## 2019-03-05 DIAGNOSIS — M1990 Unspecified osteoarthritis, unspecified site: Secondary | ICD-10-CM | POA: Diagnosis present

## 2019-03-05 DIAGNOSIS — Z801 Family history of malignant neoplasm of trachea, bronchus and lung: Secondary | ICD-10-CM | POA: Diagnosis not present

## 2019-03-05 DIAGNOSIS — T451X5A Adverse effect of antineoplastic and immunosuppressive drugs, initial encounter: Secondary | ICD-10-CM | POA: Diagnosis present

## 2019-03-05 DIAGNOSIS — R55 Syncope and collapse: Secondary | ICD-10-CM | POA: Diagnosis not present

## 2019-03-05 DIAGNOSIS — Z807 Family history of other malignant neoplasms of lymphoid, hematopoietic and related tissues: Secondary | ICD-10-CM | POA: Diagnosis not present

## 2019-03-05 DIAGNOSIS — R42 Dizziness and giddiness: Secondary | ICD-10-CM

## 2019-03-05 DIAGNOSIS — Z20828 Contact with and (suspected) exposure to other viral communicable diseases: Secondary | ICD-10-CM | POA: Diagnosis present

## 2019-03-05 DIAGNOSIS — C8590 Non-Hodgkin lymphoma, unspecified, unspecified site: Secondary | ICD-10-CM | POA: Diagnosis present

## 2019-03-05 DIAGNOSIS — Z7901 Long term (current) use of anticoagulants: Secondary | ICD-10-CM | POA: Diagnosis not present

## 2019-03-05 DIAGNOSIS — Z96651 Presence of right artificial knee joint: Secondary | ICD-10-CM | POA: Diagnosis present

## 2019-03-05 DIAGNOSIS — N183 Chronic kidney disease, stage 3 (moderate): Secondary | ICD-10-CM | POA: Diagnosis present

## 2019-03-05 DIAGNOSIS — Z79890 Hormone replacement therapy (postmenopausal): Secondary | ICD-10-CM | POA: Diagnosis not present

## 2019-03-05 DIAGNOSIS — E039 Hypothyroidism, unspecified: Secondary | ICD-10-CM | POA: Diagnosis present

## 2019-03-05 DIAGNOSIS — E1122 Type 2 diabetes mellitus with diabetic chronic kidney disease: Secondary | ICD-10-CM | POA: Diagnosis present

## 2019-03-05 DIAGNOSIS — I427 Cardiomyopathy due to drug and external agent: Secondary | ICD-10-CM | POA: Diagnosis present

## 2019-03-05 DIAGNOSIS — I951 Orthostatic hypotension: Secondary | ICD-10-CM | POA: Diagnosis present

## 2019-03-05 DIAGNOSIS — I129 Hypertensive chronic kidney disease with stage 1 through stage 4 chronic kidney disease, or unspecified chronic kidney disease: Secondary | ICD-10-CM | POA: Diagnosis present

## 2019-03-05 DIAGNOSIS — I480 Paroxysmal atrial fibrillation: Secondary | ICD-10-CM

## 2019-03-05 DIAGNOSIS — E869 Volume depletion, unspecified: Secondary | ICD-10-CM | POA: Diagnosis present

## 2019-03-05 LAB — CBC
HCT: 39.7 % (ref 36.0–46.0)
Hemoglobin: 13.1 g/dL (ref 12.0–15.0)
MCH: 32.5 pg (ref 26.0–34.0)
MCHC: 33 g/dL (ref 30.0–36.0)
MCV: 98.5 fL (ref 80.0–100.0)
Platelets: 174 10*3/uL (ref 150–400)
RBC: 4.03 MIL/uL (ref 3.87–5.11)
RDW: 12.6 % (ref 11.5–15.5)
WBC: 3.8 10*3/uL — ABNORMAL LOW (ref 4.0–10.5)
nRBC: 0 % (ref 0.0–0.2)

## 2019-03-05 LAB — URINE CULTURE: Culture: NO GROWTH

## 2019-03-05 LAB — BASIC METABOLIC PANEL
Anion gap: 8 (ref 5–15)
BUN: 31 mg/dL — ABNORMAL HIGH (ref 8–23)
CO2: 24 mmol/L (ref 22–32)
Calcium: 8.8 mg/dL — ABNORMAL LOW (ref 8.9–10.3)
Chloride: 106 mmol/L (ref 98–111)
Creatinine, Ser: 1.38 mg/dL — ABNORMAL HIGH (ref 0.44–1.00)
GFR calc Af Amer: 43 mL/min — ABNORMAL LOW (ref >60)
GFR calc non Af Amer: 37 mL/min — ABNORMAL LOW (ref >60)
Glucose, Bld: 98 mg/dL (ref 70–99)
Potassium: 3.9 mmol/L (ref 3.5–5.1)
Sodium: 138 mmol/L (ref 135–145)

## 2019-03-05 LAB — MAGNESIUM: Magnesium: 2.1 mg/dL (ref 1.7–2.4)

## 2019-03-05 LAB — PHOSPHORUS: Phosphorus: 4.1 mg/dL (ref 2.5–4.6)

## 2019-03-05 LAB — TSH: TSH: 1.333 u[IU]/mL (ref 0.350–4.500)

## 2019-03-05 MED ORDER — SODIUM CHLORIDE 0.9% FLUSH: 10.0000 mL | INTRAVENOUS | Status: DC | PRN

## 2019-03-05 MED ORDER — SACUBITRIL-VALSARTAN 24-26 MG PO TABS
1.0000 | ORAL_TABLET | Freq: Two times a day (BID) | ORAL | Status: DC
  Administered 2019-03-05 – 2019-03-06 (×2): 1 via ORAL
  Filled 2019-03-05 (×2): qty 1

## 2019-03-05 MED ORDER — CARVEDILOL 3.125 MG PO TABS
3.1250 mg | ORAL_TABLET | Freq: Two times a day (BID) | ORAL | Status: DC
  Administered 2019-03-05 – 2019-03-06 (×2): 3.125 mg via ORAL
  Filled 2019-03-05 (×2): qty 1

## 2019-03-05 NOTE — Progress Notes (Signed)
[facility]  PROGRESS NOTE  Carrie [name] RKY:[id number] DOB: 09-28-1941 DOA: 03/04/2019 PCP: Reubin Milan, MD   Brief HPI:   78 year old female with a history of cardiomyopathy with EF 20 to 25% following chemotherapy for non-Hodgkin lymphoma as per patient, paroxysmal atrial fibrillation, CKD stage III was discharged from the hospital 3 days ago at that time cardiology added Entresto and Coreg.  Patient says that today after BM she had a presyncopal episode where she was very dizzy and had sweating.  Patient was noted for presyncope.    Subjective   Patient feels much better now denies any dizziness, no chest pain or shortness of breath.   Assessment/Plan:     1. Presyncope-likely vasovagal but also possibility that patient was hypotensive from her medications at home including Coreg, Entresto, Lasix.  Cardiology was consulted to adjust medications at this time cardiology recommends restarting her home medications except Lasix.  Cardiology recommends to monitor her overnight for hypotension.  Will check orthostatic vital signs every shift.  Discussed with cardiology, if patient blood pressure is stable ,we will discharge her in a.m.  2. Course paroxysmal atrial fibrillation-heart rate is controlled, restarted on Coreg.  Continue anticoagulation with apixaban.  3. Cardiomyopathy/chronic systolic CHF-EF 20 to 31%, secondary to chemotherapy for non-Hodgkin lymphoma.  No symptoms of heart failure.  Lasix is currently on hold as per cardiology recommendation.     CBC: Recent Labs  Lab 03/04/19 1222 03/05/19 0452  WBC 6.6 3.8*  NEUTROABS 5.3  --   HGB 13.8 13.1  HCT 41.8 39.7  MCV 98.4 98.5  PLT 204 517    Basic Metabolic Panel: Recent Labs  Lab 02/27/19 0501 02/28/19 0356 03/04/19 1222 03/05/19 0452  NA 136 138 137 138  K 3.6 3.7 4.5 3.9  CL 102 105 105 106  CO2 22 23 23 24   GLUCOSE 175* 111* 105* 98  BUN 28* 28* 36* 31*  CREATININE 1.29* 1.31* 1.41* 1.38*   CALCIUM 8.4* 8.5* 9.0 8.8*  MG  --   --  2.3 2.1  PHOS  --   --   --  4.1     DVT prophylaxis: Apixaban  Code Status: Full code  Family Communication: No family at bedside  Disposition Plan: likely home when medically ready for discharge     Consultants:  Cardiology  Procedures:     Antibiotics:   Anti-infectives (From admission, onward)   None       Objective   Vitals:   03/04/19 2117 03/04/19 2322 03/05/19 0612 03/05/19 1243  BP: 139/86 137/71  136/89  Pulse: 62 64  64  Resp: (!) 22 18 20 19   Temp: 97.7 F (36.5 C) 97.8 F (36.6 C) 97.7 F (36.5 C) 97.9 F (36.6 C)  TempSrc: Oral Oral Oral Oral  SpO2: 96% 96% 98% 98%  Weight: 62.4 kg     Height: 5\' 1"  (1.549 m)       Intake/Output Summary (Last 24 hours) at 03/05/2019 1710 Last data filed at 03/05/2019 1400 Gross per 24 hour  Intake 470 ml  Output 1000 ml  Net -530 ml   Filed Weights   03/04/19 1214 03/04/19 2117  Weight: 60.9 kg 62.4 kg     Physical Examination:   General-appears in no acute distress  Heart-S1-S2, regular, no murmur auscultated  Lungs-clear to auscultation bilaterally, no wheezing or crackles auscultated  Abdomen-soft, nontender, no organomegaly  Extremities-no edema in the lower extremities  Neuro-alert, oriented x3, no focal deficit noted  Data Reviewed: I have personally reviewed following labs and imaging studies   Recent Results (from the past 240 hour(s))  SARS Coronavirus 2 (CEPHEID - Performed in [place] hospital lab), Hosp Order     Status: None   Collection Time: 02/25/19  2:30 PM  Result Value Ref Range Status   SARS Coronavirus 2 NEGATIVE NEGATIVE Final    Comment: (NOTE) If result is NEGATIVE SARS-CoV-2 target nucleic acids are NOT DETECTED. The SARS-CoV-2 RNA is generally detectable in upper and lower  respiratory specimens during the acute phase of infection. The lowest  concentration of SARS-CoV-2 viral copies this assay can  detect is 250  copies / mL. A negative result does not preclude SARS-CoV-2 infection  and should not be used as the sole basis for treatment or other  patient management decisions.  A negative result may occur with  improper specimen collection / handling, submission of specimen other  than nasopharyngeal swab, presence of viral mutation(s) within the  areas targeted by this assay, and inadequate number of viral copies  (<250 copies / mL). A negative result must be combined with clinical  observations, patient history, and epidemiological information. If result is POSITIVE SARS-CoV-2 target nucleic acids are DETECTED. The SARS-CoV-2 RNA is generally detectable in upper and lower  respiratory specimens dur ing the acute phase of infection.  Positive  results are indicative of active infection with SARS-CoV-2.  Clinical  correlation with patient history and other diagnostic information is  necessary to determine patient infection status.  Positive results do  not rule out bacterial infection or co-infection with other viruses. If result is PRESUMPTIVE POSTIVE SARS-CoV-2 nucleic acids MAY BE PRESENT.   A presumptive positive result was obtained on the submitted specimen  and confirmed on repeat testing.  While 2019 novel coronavirus  (SARS-CoV-2) nucleic acids may be present in the submitted sample  additional confirmatory testing may be necessary for epidemiological  and / or clinical management purposes  to differentiate between  SARS-CoV-2 and other Sarbecovirus currently known to infect humans.  If clinically indicated additional testing with an alternate test  methodology [phone]) is advised. The SARS-CoV-2 RNA is generally  detectable in upper and lower respiratory sp ecimens during the acute  phase of infection. The expected result is Negative. Fact Sheet for Patients:  StrictlyIdeas.no Fact Sheet for Healthcare  Providers: BankingDealers.co.za This test is not yet approved or cleared by the [place] FDA and has been authorized for detection and/or diagnosis of SARS-CoV-2 by FDA under an Emergency Use Authorization (EUA).  This EUA will remain in effect (meaning this test can be used) for the duration of the COVID-19 declaration under Section 564(b)(1) of the Act, 21 U.S.C. section 360bbb-3(b)(1), unless the authorization is terminated or revoked sooner. Performed at [facility], [place] [address] [place], [place] [address]   Urine culture     Status: None   Collection Time: 03/04/19  4:42 PM  Result Value Ref Range Status   Specimen Description   Final    URINE, RANDOM Performed at [place] [address] [place], [place] [address]    Special Requests   Final    NONE Performed at [facility], [place] [address], [place], [place] [address]    Culture   Final    NO GROWTH Performed at [facility], [place] [address] [place], [place] [address]    Report Status 03/05/2019 FINAL  Final  SARS Coronavirus 2 (CEPHEID -  Performed in [facility] hospital lab), Hosp Order     Status: None   Collection Time: 03/04/19  7:29 PM  Result Value Ref Range Status   SARS Coronavirus 2 NEGATIVE NEGATIVE Final    Comment: (NOTE) If result is NEGATIVE SARS-CoV-2 target nucleic acids are NOT DETECTED. The SARS-CoV-2 RNA is generally detectable in upper and lower  respiratory specimens during the acute phase of infection. The lowest  concentration of SARS-CoV-2 viral copies this assay can detect is 250  copies / mL. A negative result does not preclude SARS-CoV-2 infection  and should not be used as the sole basis for treatment or other  patient management decisions.  A negative result may occur with  improper specimen collection / handling, submission of specimen other  than nasopharyngeal swab, presence of viral  mutation(s) within the  areas targeted by this assay, and inadequate number of viral copies  (<250 copies / mL). A negative result must be combined with clinical  observations, patient history, and epidemiological information. If result is POSITIVE SARS-CoV-2 target nucleic acids are DETECTED. The SARS-CoV-2 RNA is generally detectable in upper and lower  respiratory specimens dur ing the acute phase of infection.  Positive  results are indicative of active infection with SARS-CoV-2.  Clinical  correlation with patient history and other diagnostic information is  necessary to determine patient infection status.  Positive results do  not rule out bacterial infection or co-infection with other viruses. If result is PRESUMPTIVE POSTIVE SARS-CoV-2 nucleic acids MAY BE PRESENT.   A presumptive positive result was obtained on the submitted specimen  and confirmed on repeat testing.  While 2019 novel coronavirus  (SARS-CoV-2) nucleic acids may be present in the submitted sample  additional confirmatory testing may be necessary for epidemiological  and / or clinical management purposes  to differentiate between  SARS-CoV-2 and other Sarbecovirus currently known to infect humans.  If clinically indicated additional testing with an alternate test  methodology [phone]) is advised. The SARS-CoV-2 RNA is generally  detectable in upper and lower respiratory sp ecimens during the acute  phase of infection. The expected result is Negative. Fact Sheet for Patients:  StrictlyIdeas.no Fact Sheet for Healthcare Providers: BankingDealers.co.za This test is not yet approved or cleared by the [place] FDA and has been authorized for detection and/or diagnosis of SARS-CoV-2 by FDA under an Emergency Use Authorization (EUA).  This EUA will remain in effect (meaning this test can be used) for the duration of the COVID-19 declaration under Section 564(b)(1)  of the Act, 21 U.S.C. section 360bbb-3(b)(1), unless the authorization is terminated or revoked sooner. Performed at [facility], [place] [address] [place], [place] [address]      Liver Function Tests: Recent Labs  Lab 03/04/19 1222  AST 17  ALT 14  ALKPHOS 74  BILITOT 1.2  PROT 6.9  ALBUMIN 4.5   No results for input(s): LIPASE, AMYLASE in the last 168 hours. No results for input(s): AMMONIA in the last 168 hours.  Cardiac Enzymes: Recent Labs  Lab 03/04/19 1222 03/04/19 1644  TROPONINI <0.03 <0.03   BNP (last 3 results) Recent Labs    02/16/19 1226 02/25/19 1243 03/04/19 1222  BNP 1,207.1* 979.1* 207.3*    ProBNP (last 3 results) No results for input(s): PROBNP in the last 8760 hours.    Studies: Ct Head Wo Contrast  Result Date: 03/04/2019 CLINICAL DATA:  Headache and weakness for 2 weeks EXAM: CT HEAD WITHOUT CONTRAST TECHNIQUE: Contiguous axial images  were obtained from the base of the skull through the vertex without intravenous contrast. COMPARISON:  October 18, 2013 FINDINGS: Brain: No subdural, epidural, or subarachnoid hemorrhage. Ventricles and sulci are unremarkable. Cerebellum, brainstem, and basal cisterns are normal. No mass effect or midline shift. Scattered white matter changes have increased since 2015 but remain moderate. No acute cortical ischemia or infarct. Vascular: Atherosclerotic changes are seen in the intracranial carotids. Skull: Normal. Negative for fracture or focal lesion. Sinuses/Orbits: No acute finding. Other: None. IMPRESSION: 1. Increasing chronic white matter changes compared to 2015. No acute intracranial abnormalities are noted. Electronically Signed   By: Dorise Bullion III M.D   On: 03/04/2019 14:22   Dg Chest Port 1 View  Result Date: 03/04/2019 CLINICAL DATA:  Pain. EXAM: PORTABLE CHEST 1 VIEW COMPARISON:  Feb 25, 2019 FINDINGS: Stable right Port-A-Cath. No pneumothorax. Stable cardiomegaly. The hila and  mediastinum are normal. No pulmonary nodules, masses, or focal infiltrates. IMPRESSION: No active disease. Electronically Signed   By: Dorise Bullion III M.D   On: 03/04/2019 13:29    Scheduled Meds: . amiodarone  200 mg Oral BID  . apixaban  5 mg Oral BID  . carvedilol  3.125 mg Oral BID WC  . levothyroxine  112 mcg Oral Daily  . sacubitril-valsartan  1 tablet Oral BID    Admission status: Inpatient: Based on patients clinical presentation and evaluation of above clinical data, I have made determination that patient meets Inpatient criteria at this time.  Time spent: 20 min  [facility] Pager [phone]. If 7PM-7AM, please contact night-coverage at [email or web address], Office  [phone]  password [email or web address]  03/05/2019, 5:10 PM  LOS: 0 days

## 2019-03-05 NOTE — Consult Note (Signed)
Cardiology Consultation:   Due to the COVID-19 pandemic, this visit was completed with telemedicine (audio/video) technology to reduce patient and provider exposure as well as to preserve personal protective equipment.   Patient ID: Carrie [name] MRN: [id number]; DOB: August 14, 1941  Admit date: 03/04/2019 Date of Consult: 03/05/2019  Primary Care Provider: Reubin Milan, MD Primary Cardiologist: Carrie Klein, MD  Primary Electrophysiologist:  None    Patient Profile:   Carrie [name] is a 78 y.o. female with a hx of PAF, HTN, follicular lymphoma grade 3A, with chemo,  neg nuc 2010 and CHAD2S2VASc of 5 who is being seen today for the evaluation of a fib with RVR at the request of Dr. Darrick [name].  History of Present Illness:   Carrie [name] with above hx and ER visit 02/16/19 with weakness + a fib and DCCV.  She is on Eliquis and had not missed doses.  She saw her [facility] and monitor placed. On 02/25/19 was back in ER with symptomatic a fib.  And amiodarone drip started.  Also lasix for CHF.   Echo revealed EF 20-25% with normal cavity size.   She was diuresed.  And Dr. Alvy Bimler was notified of low EF and chemo Rx should be changed --no more rituximab.  dilt stopped and BB added.  D/c'd 02/28/19 in [facility].    Pt presented back to ER 03/04/19 with Lt upper chest pain and SOB. She had episode of near syncope after BM.  She was also diaphoretic but no chest pain or palpitations.  She was noted to be hypotensive and re'c fluids.   Troponin neg X 2, she was kept overnight to monitor.  She has been weak for 2 weeks.  The chest pain reported was pin prick type pain above her left breast,  No associated symptoms with this.  None this AM.  EKG:  The EKG was personally reviewed and demonstrates:  SB at 60 with inverted T waves in V2-V5 new from 02/16/19. Telemetry:  Telemetry was personally reviewed and demonstrates:  SR  Troponin <0.03 X 2, BNP 207 Na 138, K+ 3.9, BUN 31 and Cr 1.38 Mg+ 2.1 Hgb 13.1 plts 174  CT Head  without acute abnormality  CXR with no active disease.   Cepheid for COVID neg 02/25/19 and 03/04/19  BP this AM with drop in BP from sitting to standing from 137/78 to 111/84.   Currently no further complaints.   Past Medical History:  Diagnosis Date  . A-fib ([place])   . Anemia    "years ago"  . Atrial fibrillation with rapid ventricular response ([place]) 06/25/2015  . Carpal tunnel syndrome, bilateral   . Cervical spondylosis without myelopathy 10/25/2013  . Dental bridge present   . DJD (degenerative joint disease)   . Dysrhythmia    WENT INTO A FIB IN 2017   . Elevated troponin 02/18/2015  . Follicular lymphoma grade 3a ([place]) 02/03/2015  . History of echocardiogram    Echo 12/16: EF 60-65%, no RWMA, severe LAE  . History of nuclear stress test    Myoview 1/17: EF 55%, Normal study. No ischemia or scar.  . Hypothyroid   . Nausea without vomiting 06/20/2015  . Pneumonia   . Stage III chronic kidney disease ([place]) 07/01/2015  . Thrush of mouth and esophagus ([place]) 06/25/2015    Past Surgical History:  Procedure Laterality Date  . ABDOMINAL HYSTERECTOMY    . APPENDECTOMY    . BACK SURGERY     X5-lumbar-fusion  . COLONOSCOPY    .  DILATION AND CURETTAGE OF UTERUS    . LYMPH NODE BIOPSY Right 01/21/2015   Procedure: RIGHT GROIN LYMPH NODE BIOPSY;  Surgeon: Carrie Luna, MD;  Location: [facility];  Service: General;  Laterality: Right;  . MASS EXCISION Left 08/29/2018   Procedure: EXCISION LEFT BACK  MASS;  Surgeon: Carrie Luna, MD;  Location: [facility];  Service: General;  Laterality: Left;  . Ovarian cyst resection    . patelar tendon transplants     Left/right  . PORTACATH PLACEMENT Right 02/13/2015   Procedure: INSERTION PORT-A-CATH WITH ULTRASOUND;  Surgeon: Carrie Luna, MD;  Location: [facility];  Service: General;  Laterality: Right;  . PORTACATH PLACEMENT N/A 08/29/2018   Procedure: INSERTION PORT-A-CATH WITH ULTRA SOUND ERAS PATHWAY;  Surgeon: Carrie Luna, MD;   Location: [facility];  Service: General;  Laterality: N/A;  . TOTAL KNEE ARTHROPLASTY  2011   Right  . TUBAL LIGATION       Home Medications:  Prior to Admission medications   Medication Sig Start Date End Date Taking? Authorizing Provider  acetaminophen (TYLENOL) 500 MG tablet Take 500 mg by mouth every 6 (six) hours as needed for moderate pain or headache.   Yes [provider]  amiodarone (PACERONE) 200 MG tablet Take 1 tablet (200 mg total) by mouth 2 (two) times daily. 02/28/19  Yes Pahwani, Einar Grad, MD  apixaban (ELIQUIS) 5 MG TABS tablet Take 5 mg by mouth 2 (two) times daily.    Yes [provider]  bisacodyl (DULCOLAX) 5 MG EC tablet Take 5 mg by mouth daily as needed for moderate constipation.   Yes [provider]  carvedilol (COREG) 3.125 MG tablet Take 1 tablet (3.125 mg total) by mouth 2 (two) times daily with a meal. 02/28/19  Yes Pahwani, Einar Grad, MD  furosemide (LASIX) 20 MG tablet Take 0.5 tablets (10 mg total) by mouth daily. 03/01/19  Yes Pahwani, Einar Grad, MD  levothyroxine (SYNTHROID, LEVOTHROID) 112 MCG tablet Take 112 mcg by mouth daily.  04/22/15  Yes [provider]  polyvinyl alcohol (LIQUIFILM TEARS) 1.4 % ophthalmic solution Place 1 drop into both eyes as needed for dry eyes.   Yes [provider]  sacubitril-valsartan (ENTRESTO) 24-26 MG Take 1 tablet by mouth 2 (two) times daily. 02/28/19  Yes Pahwani, Einar Grad, MD  acyclovir (ZOVIRAX) 400 MG tablet Take 1 tablet (400 mg total) by mouth daily. Patient not taking: Reported on 01/30/2019 09/04/18   Carrie Lose, MD  cephALEXin (KEFLEX) 500 MG capsule Take 1 capsule (500 mg total) by mouth 3 (three) times daily. Patient not taking: Reported on 02/25/2019 01/30/19   Varney Biles, MD  diclofenac sodium (VOLTAREN) 1 % GEL Apply 2 g topically 3 (three) times daily as needed (knee pain). Patient not taking: Reported on 02/25/2019 08/01/18   Quintella Reichert, MD  lidocaine-prilocaine (EMLA) cream Apply  to affected area once Patient not taking: Reported on 02/25/2019 09/07/18   Carrie Lose, MD  LORazepam (ATIVAN) 0.5 MG tablet Take 1 tablet (0.5 mg total) by mouth every 6 (six) hours as needed (Nausea or vomiting). Patient not taking: Reported on 02/25/2019 09/04/18   Carrie Lose, MD  omeprazole (PRILOSEC) 40 MG capsule Take 1 capsule (40 mg total) by mouth daily. Patient not taking: Reported on 01/30/2019 09/19/18   [name], MD  ondansetron (ZOFRAN ODT) 8 MG disintegrating tablet Take 1 tablet (8 mg total) by mouth every 8 (eight) hours as needed for nausea. Patient not taking: Reported on 02/25/2019 01/30/19  Varney Biles, MD  ondansetron (ZOFRAN) 8 MG tablet Take 1 tablet (8 mg total) by mouth 2 (two) times daily as needed for refractory nausea / vomiting. Start on day 2 after bendamustine chemotherapy. Patient not taking: Reported on 02/16/2019 09/07/18   Carrie Lose, MD  potassium chloride SA (K-DUR) 20 MEQ tablet Take 1 tablet (20 mEq total) by mouth 2 (two) times daily. Patient not taking: Reported on 02/25/2019 02/17/19   Varney Biles, MD  prochlorperazine (COMPAZINE) 10 MG tablet Take 1 tablet (10 mg total) by mouth every 6 (six) hours as needed (Nausea or vomiting). 09/07/18   Carrie Lose, MD    Inpatient Medications: Scheduled Meds: . amiodarone  200 mg Oral BID  . apixaban  5 mg Oral BID  . levothyroxine  112 mcg Oral Daily   Continuous Infusions:  PRN Meds: acetaminophen, bisacodyl, heparin lock flush, polyvinyl alcohol, sodium chloride flush  Allergies:   No Known Allergies  Social History:   Social History   Socioeconomic History  . Marital status: Widowed    Spouse name: Not on file  . Number of children: 2  . Years of education: hs  . Highest education level: Not on file  Occupational History  . Occupation: Retired  Scientific laboratory technician  . Financial resource strain: Not on file  . Food insecurity:    Worry: Not on file    Inability: Not on file  .  Transportation needs:    Medical: Not on file    Non-medical: Not on file  Tobacco Use  . Smoking status: Never Smoker  . Smokeless tobacco: Never Used  Substance and Sexual Activity  . Alcohol use: Yes    Comment: occassional wine  . Drug use: No  . Sexual activity: Not on file  Lifestyle  . Physical activity:    Days per week: Not on file    Minutes per session: Not on file  . Stress: Not on file  Relationships  . Social connections:    Talks on phone: Not on file    Gets together: Not on file    Attends religious service: Not on file    Active member of club or organization: Not on file    Attends meetings of clubs or organizations: Not on file    Relationship status: Not on file  . Intimate partner violence:    Fear of current or ex partner: Not on file    Emotionally abused: Not on file    Physically abused: Not on file    Forced sexual activity: Not on file  Other Topics Concern  . Not on file  Social History Narrative   Lives alone.  Has a walker for home use.    Family History:    Family History  Problem Relation Age of Onset  . Cancer Mother        Breast, lung NHL  . Cancer Sister        Multiple myeloma     ROS:  Please see the history of present illness.  General:no colds or fevers, no weight changes wt at discharge 135 lbs and today 137 lbs. Skin:no rashes or ulcers HEENT:no blurred vision, no congestion CV:see HPI PUL:see HPI GI:no diarrhea constipation or melena, no indigestion GU:no hematuria, no dysuria MS:no joint pain, no claudication Neuro:pre- syncope, no lightheadedness Endo:no diabetes, + thyroid disease  All other ROS reviewed and negative.     Physical Exam/Data:   Vitals:   03/04/19 2030 03/04/19 2117 03/04/19 2322 03/05/19  0612  BP: 122/75 139/86 137/71   Pulse: (!) 59 62 64   Resp: (!) 21 (!) '22 18 20  ' Temp:  97.7 F (36.5 C) 97.8 F (36.6 C) 97.7 F (36.5 C)  TempSrc:  Oral Oral Oral  SpO2: 99% 96% 96% 98%  Weight:   62.4 kg    Height:  '5\' 1"'  (1.549 m)      Intake/Output Summary (Last 24 hours) at 03/05/2019 0955 Last data filed at 03/05/2019 6812 Gross per 24 hour  Intake 120 ml  Output 900 ml  Net -780 ml   Last 3 Weights 03/04/2019 03/04/2019 02/28/2019  Weight (lbs) 137 lb 9.1 oz 134 lb 4 oz 135 lb 11.2 oz  Weight (kg) 62.4 kg 60.895 kg 61.553 kg     Body mass index is 25.99 kg/m.       Relevant CV Studies: Echo 02/25/19 IMPRESSIONS    1. The left ventricle has severely reduced systolic function, with an ejection fraction of 20-25%. The cavity size was normal. Left ventricular diastolic function could not be evaluated secondary to atrial fibrillation. Left ventricular diffuse  hypokinesis.  2. The right ventricle has severely reduced systolic function. The cavity was mildly enlarged. There is no increase in right ventricular wall thickness. Right ventricular systolic pressure is mildly elevated with an estimated pressure of 23.5 mmHg.  3. Left atrial size was mildly dilated.  4. Right atrial size was mildly dilated.  5. Trivial pericardial effusion is present.  6. The mitral valve is grossly normal. Mild thickening of the mitral valve leaflet. Mitral valve regurgitation is moderate by color flow Doppler.  7. The aortic valve is tricuspid. Aortic valve regurgitation is trivial by color flow Doppler. No stenosis of the aortic valve.  SUMMARY   When compared to the prior study from 2016 there is a significant change, LVEF has decreased from 50-55% to 15-20% with diffuse hypokinesis. RVEF is severely decreased.  FINDINGS  Left Ventricle: The left ventricle has severely reduced systolic function, with an ejection fraction of 20-25%. The cavity size was normal. There is no increase in left ventricular wall thickness. Left ventricular diastolic function could not be  evaluated secondary to atrial fibrillation. Left ventricular diffuse hypokinesis.  Right Ventricle: The right ventricle has  severely reduced systolic function. The cavity was mildly enlarged. There is no increase in right ventricular wall thickness. Right ventricular systolic pressure is mildly elevated with an estimated pressure of  23.5 mmHg.  Left Atrium: Left atrial size was mildly dilated.  Right Atrium: Right atrial size was mildly dilated. Right atrial pressure is estimated at 8 mmHg.  Interatrial Septum: No atrial level shunt detected by color flow Doppler.  Pericardium: Trivial pericardial effusion is present.  Mitral Valve: The mitral valve is grossly normal. Mild thickening of the mitral valve leaflet. Mitral valve regurgitation is moderate by color flow Doppler.  Tricuspid Valve: The tricuspid valve is normal in structure. Tricuspid valve regurgitation is mild by color flow Doppler.  Aortic Valve: The aortic valve is tricuspid Aortic valve regurgitation is trivial by color flow Doppler. There is No stenosis of the aortic valve.  Pulmonic Valve: The pulmonic valve was not assessed. Pulmonic valve regurgitation was not assessed by color flow Doppler.  Venous: The inferior vena cava is normal in size with greater than 50% respiratory variability.      Laboratory Data:  Chemistry Recent Labs  Lab 02/28/19 0356 03/04/19 1222 03/05/19 0452  NA 138 137 138  K 3.7 4.5 3.9  CL 105 105 106  CO2 '23 23 24  ' GLUCOSE 111* 105* 98  BUN 28* 36* 31*  CREATININE 1.31* 1.41* 1.38*  CALCIUM 8.5* 9.0 8.8*  GFRNONAA 39* 36* 37*  GFRAA 46* 42* 43*  ANIONGAP '10 9 8    ' Recent Labs  Lab 03/04/19 1222  PROT 6.9  ALBUMIN 4.5  AST 17  ALT 14  ALKPHOS 74  BILITOT 1.2   Hematology Recent Labs  Lab 03/04/19 1222 03/05/19 0452  WBC 6.6 3.8*  RBC 4.25 4.03  HGB 13.8 13.1  HCT 41.8 39.7  MCV 98.4 98.5  MCH 32.5 32.5  MCHC 33.0 33.0  RDW 12.8 12.6  PLT 204 174   Cardiac Enzymes Recent Labs  Lab 03/04/19 1222 03/04/19 1644  TROPONINI <0.03 <0.03   No results for input(s):  TROPIPOC in the last 168 hours.  BNP Recent Labs  Lab 03/04/19 1222  BNP 207.3*    DDimer No results for input(s): DDIMER in the last 168 hours.  Radiology/Studies:  Ct Head Wo Contrast  Result Date: 03/04/2019 CLINICAL DATA:  Headache and weakness for 2 weeks EXAM: CT HEAD WITHOUT CONTRAST TECHNIQUE: Contiguous axial images were obtained from the base of the skull through the vertex without intravenous contrast. COMPARISON:  October 18, 2013 FINDINGS: Brain: No subdural, epidural, or subarachnoid hemorrhage. Ventricles and sulci are unremarkable. Cerebellum, brainstem, and basal cisterns are normal. No mass effect or midline shift. Scattered white matter changes have increased since 2015 but remain moderate. No acute cortical ischemia or infarct. Vascular: Atherosclerotic changes are seen in the intracranial carotids. Skull: Normal. Negative for fracture or focal lesion. Sinuses/Orbits: No acute finding. Other: None. IMPRESSION: 1. Increasing chronic white matter changes compared to 2015. No acute intracranial abnormalities are noted. Electronically Signed   By: Dorise Bullion III M.D   On: 03/04/2019 14:22   Dg Chest Port 1 View  Result Date: 03/04/2019 CLINICAL DATA:  Pain. EXAM: PORTABLE CHEST 1 VIEW COMPARISON:  Feb 25, 2019 FINDINGS: Stable right Port-A-Cath. No pneumothorax. Stable cardiomegaly. The hila and mediastinum are normal. No pulmonary nodules, masses, or focal infiltrates. IMPRESSION: No active disease. Electronically Signed   By: Dorise Bullion III M.D   On: 03/04/2019 13:29     Assessment and Plan:   1. Near syncope secondary to orthostatic hypotension , recommend support stockings.  Her coreg 3.125 and her entresto have been held. Dr. Angelena Form to see.  2. orthostatic hypotension despite mild increase of wt..  3. Cardiomyopathy 02/25/19 EF 20-25% while in a fib.  But also had just completed chemo.   Now SR.  ? limited echo to re eval vs slowly resuming BB and entresto 4.  PAF - on discharge 02/28/19 was in SR and SR on admit here.  On amiodarone 200 mg BID  TSH 1.33 5. Hx of neg nuc in 2017 6. Anticoagulation on Eliquis.  7. Chest pain, pin-prick type sensation over Lt breast would come about every 20 min.  None now. Neg troponin.  ? outpt nuc  For questions or updates, please contact [place] Please consult [email or web address] for contact info under   Signed, Cecilie Kicks, NP  03/05/2019 9:55 AM   I have personally seen and examined this patient. I agree with the assessment and plan as outlined above. I examined this patient in her room with a face to face visit. She is known to have PAF and is on Eliquis and Coreg. DCCV Feb 16, 2019 and then amiodarone added 02/25/19.  She has been receiving chemotherapy for lymphoma and has been feeling weak for several weeks. She was orthostatic on admission. Coreg and Entresto were held. Her BP is now in the 130s. Known to have cardiomyopathy which was a new finding early may 2020. LVEF=20-25%. Question of relation to chemotherapy or atrial fib.  She is in sinus today.  My exam:   General: Well developed, well nourished, NAD  HEENT: OP clear, mucus membranes moist  SKIN: warm, dry. No rashes. Neuro: No focal deficits  Musculoskeletal: Muscle strength 5/5 all ext  Psychiatric: Mood and affect normal  Neck: No JVD, no carotid bruits, no thyromegaly, no lymphadenopathy.  Lungs:Clear bilaterally, no wheezes, rhonci, crackles Cardiovascular: Regular rate and rhythm. No murmurs, gallops or rubs. Abdomen:Soft. Bowel sounds present. Non-tender.  Extremities: No lower extremity edema. Pulses are 2 + in the bilateral DP/PT.  Plan: She has paroxysmal atrial fib and was admitted with near syncope. She is in sinus. Her near syncope may have been due to volume depletion. Would resume Coreg and Entresto as she is not hypotensive. Would monitor today at least until 3-4 pm to make sure BP ok after restarting these medications. No  indication for ischemic evaluation. Continue amiodarone and Eliquis. Would not restart Lasix or potassium.  She has f/u in our office for virtual visit with Dr. Meda Coffee next week.   Lauree Chandler 03/05/2019 11:21 AM

## 2019-03-06 MED ORDER — HEPARIN SOD (PORK) LOCK FLUSH 100 UNIT/ML IV SOLN
500.0000 [IU] | INTRAVENOUS | Status: AC | PRN
  Administered 2019-03-06: 500 [IU]

## 2019-03-06 NOTE — Progress Notes (Signed)
Progress Note  Due to the COVID-19 pandemic, this NP visit was completed with telemedicine (audio/video) technology to reduce patient and provider exposure as well as to preserve personal protective equipment.  See MD note below for full face to face visit info.   Patient Name: Carrie [name] Date of Encounter: 03/06/2019  Primary Cardiologist: Sanda Klein, MD   Subjective   No chest pain and no SOB, the monitor she was wearing was mailed back to [place], she is waiting for those results.  Inpatient Medications    Scheduled Meds: . amiodarone  200 mg Oral BID  . apixaban  5 mg Oral BID  . carvedilol  3.125 mg Oral BID WC  . levothyroxine  112 mcg Oral Daily  . sacubitril-valsartan  1 tablet Oral BID   Continuous Infusions:  PRN Meds: acetaminophen, bisacodyl, heparin lock flush, polyvinyl alcohol, sodium chloride flush   Vital Signs    Vitals:   03/05/19 0612 03/05/19 1243 03/05/19 2126 03/06/19 0642  BP:  136/89 136/71 135/85  Pulse:  64 63 (!) 58  Resp: 20 19 18 18   Temp: 97.7 F (36.5 C) 97.9 F (36.6 C) 99.3 F (37.4 C) 97.6 F (36.4 C)  TempSrc: Oral Oral Oral Oral  SpO2: 98% 98% 98% 97%  Weight:      Height:        Intake/Output Summary (Last 24 hours) at 03/06/2019 0800 Last data filed at 03/06/2019 0200 Gross per 24 hour  Intake 1010 ml  Output 1000 ml  Net 10 ml   Last 3 Weights 03/04/2019 03/04/2019 02/28/2019  Weight (lbs) 137 lb 9.1 oz 134 lb 4 oz 135 lb 11.2 oz  Weight (kg) 62.4 kg 60.895 kg 61.553 kg      Telemetry    SR - Personally Reviewed  ECG    No new - Personally Reviewed  Physical Exam    See MD exam below  Labs    Chemistry Recent Labs  Lab 02/28/19 0356 03/04/19 1222 03/05/19 0452  NA 138 137 138  K 3.7 4.5 3.9  CL 105 105 106  CO2 23 23 24   GLUCOSE 111* 105* 98  BUN 28* 36* 31*  CREATININE 1.31* 1.41* 1.38*  CALCIUM 8.5* 9.0 8.8*  PROT  --  6.9  --   ALBUMIN  --  4.5  --   AST  --  17  --   ALT  --  14  --    ALKPHOS  --  74  --   BILITOT  --  1.2  --   GFRNONAA 39* 36* 37*  GFRAA 46* 42* 43*  ANIONGAP 10 9 8      Hematology Recent Labs  Lab 03/04/19 1222 03/05/19 0452  WBC 6.6 3.8*  RBC 4.25 4.03  HGB 13.8 13.1  HCT 41.8 39.7  MCV 98.4 98.5  MCH 32.5 32.5  MCHC 33.0 33.0  RDW 12.8 12.6  PLT 204 174    Cardiac Enzymes Recent Labs  Lab 03/04/19 1222 03/04/19 1644  TROPONINI <0.03 <0.03   No results for input(s): TROPIPOC in the last 168 hours.   BNP Recent Labs  Lab 03/04/19 1222  BNP 207.3*     DDimer No results for input(s): DDIMER in the last 168 hours.   Radiology    Ct Head Wo Contrast  Result Date: 03/04/2019 CLINICAL DATA:  Headache and weakness for 2 weeks EXAM: CT HEAD WITHOUT CONTRAST TECHNIQUE: Contiguous axial images were obtained from the base of the  skull through the vertex without intravenous contrast. COMPARISON:  October 18, 2013 FINDINGS: Brain: No subdural, epidural, or subarachnoid hemorrhage. Ventricles and sulci are unremarkable. Cerebellum, brainstem, and basal cisterns are normal. No mass effect or midline shift. Scattered white matter changes have increased since 2015 but remain moderate. No acute cortical ischemia or infarct. Vascular: Atherosclerotic changes are seen in the intracranial carotids. Skull: Normal. Negative for fracture or focal lesion. Sinuses/Orbits: No acute finding. Other: None. IMPRESSION: 1. Increasing chronic white matter changes compared to 2015. No acute intracranial abnormalities are noted. Electronically Signed   By: Dorise Bullion III M.D   On: 03/04/2019 14:22   Dg Chest Port 1 View  Result Date: 03/04/2019 CLINICAL DATA:  Pain. EXAM: PORTABLE CHEST 1 VIEW COMPARISON:  Feb 25, 2019 FINDINGS: Stable right Port-A-Cath. No pneumothorax. Stable cardiomegaly. The hila and mediastinum are normal. No pulmonary nodules, masses, or focal infiltrates. IMPRESSION: No active disease. Electronically Signed   By: Dorise Bullion III  M.D   On: 03/04/2019 13:29    Cardiac Studies   Echo 02/25/19 IMPRESSIONS   1. The left ventricle has severely reduced systolic function, with an ejection fraction of 20-25%. The cavity size was normal. Left ventricular diastolic function could not be evaluated secondary to atrial fibrillation. Left ventricular diffuse  hypokinesis. 2. The right ventricle has severely reduced systolic function. The cavity was mildly enlarged. There is no increase in right ventricular wall thickness. Right ventricular systolic pressure is mildly elevated with an estimated pressure of 23.5 mmHg. 3. Left atrial size was mildly dilated. 4. Right atrial size was mildly dilated. 5. Trivial pericardial effusion is present. 6. The mitral valve is grossly normal. Mild thickening of the mitral valve leaflet. Mitral valve regurgitation is moderate by color flow Doppler. 7. The aortic valve is tricuspid. Aortic valve regurgitation is trivial by color flow Doppler. No stenosis of the aortic valve.  SUMMARY  When compared to the prior study from 2016 there is a significant change, LVEF has decreased from 50-55% to 15-20% with diffuse hypokinesis. RVEF is severely decreased. FINDINGS Left Ventricle: The left ventricle has severely reduced systolic function, with an ejection fraction of 20-25%. The cavity size was normal. There is no increase in left ventricular wall thickness. Left ventricular diastolic function could not be  evaluated secondary to atrial fibrillation. Left ventricular diffuse hypokinesis.  Right Ventricle: The right ventricle has severely reduced systolic function. The cavity was mildly enlarged. There is no increase in right ventricular wall thickness. Right ventricular systolic pressure is mildly elevated with an estimated pressure of  23.5 mmHg.  Left Atrium: Left atrial size was mildly dilated.  Right Atrium: Right atrial size was mildly dilated. Right atrial pressure is  estimated at 8 mmHg.  Interatrial Septum: No atrial level shunt detected by color flow Doppler.  Pericardium: Trivial pericardial effusion is present.  Mitral Valve: The mitral valve is grossly normal. Mild thickening of the mitral valve leaflet. Mitral valve regurgitation is moderate by color flow Doppler.  Tricuspid Valve: The tricuspid valve is normal in structure. Tricuspid valve regurgitation is mild by color flow Doppler.  Aortic Valve: The aortic valve is tricuspid Aortic valve regurgitation is trivial by color flow Doppler. There is No stenosis of the aortic valve.  Pulmonic Valve: The pulmonic valve was not assessed. Pulmonic valve regurgitation was not assessed by color flow Doppler.  Venous: The inferior vena cava is normal in size with greater than 50% respiratory variability.   Patient Profile  78 y.o. female with a hx of PAF, HTN, follicular lymphoma grade 3A, with chemo,  neg nuc 2010 and CHAD2S2VASc of 5 who is being seen today for the near syncope   Assessment & Plan    1. Near syncope secondary to orthostatic hypotension , recommend support stockings.  Her coreg 3.125 and her entresto have been held. Now resumed and Orthostatic BP improved. .   2. orthostatic hypotension despite mild increase of wt.. now improved today  3. Cardiomyopathy 02/25/19 EF 20-25% while in a fib.  But also had just completed chemo.   Now SR.   4. PAF - on discharge 02/28/19 was in SR and SR on admit here.  On amiodarone 200 mg BID  TSH 1.33 5. Hx of neg nuc in 2017 6. Anticoagulation on Eliquis.  7. Chest pain, pin-prick type sensation over Lt breast would come about every 20 min.  None now. Neg troponin.  no need for ischemic work up. 8. Has f/u with Ellen Henri on 5/27 telehealth. Pt aware     For questions or updates, please contact [facility] Please consult [email or web address] for contact info under        Signed, Cecilie Kicks, NP  03/06/2019, 8:00 AM     I have  personally seen and examined this patient. I agree with the assessment and plan as outlined above.  BP stable today. Orthostasis resolved. No further syncope. She is back on Coreg and Entresto. Lasix has been stopped. Would not restart Lasix or potassium My exam:  General: Well developed, well nourished, NAD  HEENT: OP clear, mucus membranes moist  SKIN: warm, dry. No rashes. Neuro: No focal deficits  Musculoskeletal: Muscle strength 5/5 all ext  Psychiatric: Mood and affect normal  Neck: No JVD, no carotid bruits, no thyromegaly, no lymphadenopathy.  Lungs:Clear bilaterally, no wheezes, rhonci, crackles Cardiovascular: Regular rate and rhythm. No murmurs, gallops or rubs. Abdomen:Soft. Bowel sounds present. Non-tender.  Extremities: No lower extremity edema. Pulses are 2 + in the bilateral DP/PT.  Plan: OK to d/c home on Coreg and Entresto. Stop Lasix. Follow up next week in our office by telehealth visit.   Lauree Chandler 03/06/2019 9:29 AM

## 2019-03-06 NOTE — Discharge Summary (Signed)
Physician Discharge Summary  Carrie [name]:[id number] DOB: 1940-12-12 DOA: 03/04/2019  PCP: Reubin Milan, MD  Admit date: 03/04/2019 Discharge date: 03/06/2019  Time spent: 45 minutes  Recommendations for Outpatient Follow-up:  1. Follow-up cardiology in 1 week  Discharge Diagnoses:  Active Problems:   PAF (paroxysmal atrial fibrillation) (HCC)   Postural dizziness with presyncope   Near syncope   Syncope   Discharge Condition: Stable  Diet recommendation: Heart healthy diet  Filed Weights   03/04/19 1214 03/04/19 2117  Weight: 60.9 kg 62.4 kg    History of present illness:  78 year old female with a history of cardiomyopathy with EF 20 to 25% following chemotherapy for non-Hodgkin lymphoma as per patient, paroxysmal atrial fibrillation, CKD stage III was discharged from the hospital 3 days ago at that time cardiology added Entresto and Coreg.  Patient says that today after BM she had a presyncopal episode where she was very dizzy and had sweating.  Patient was noted for presyncope.   Hospital Course:   Presyncope-likely vasovagal but also possibility that patient was hypotensive from her medications at home including Coreg, Entresto, Lasix.  Cardiology was consulted to adjust medications at this time cardiology recommends restarting her home medications except Lasix.  Blood pressure has been stable, orthostatic vital signs checked this morning showed no postural hypotension.  Patient will be discharged home on current medical regimen Lasix and potassium has been discontinued per cardiology.  Patient to follow-up cardiology in 1 week.   2.  paroxysmal atrial fibrillation-heart rate is controlled, restarted on Coreg.  Continue anticoagulation with apixaban.  3. Cardiomyopathy/chronic systolic CHF-EF 20 to 31%, secondary to chemotherapy for non-Hodgkin lymphoma.  No symptoms of heart failure.  Lasix is currently on hold as per cardiology  recommendation.    Procedures:    Consultations:  Cardiology  Discharge Exam: Vitals:   03/05/19 2126 03/06/19 0642  BP: 136/71 135/85  Pulse: 63 (!) 58  Resp: 18 18  Temp: 99.3 F (37.4 C) 97.6 F (36.4 C)  SpO2: 98% 97%    General-appears in no acute distress Heart-S1-S2, regular, no murmur auscultated Lungs-clear to auscultation bilaterally, no wheezing or crackles auscultated Abdomen-soft, nontender, no organomegaly Extremities-no edema in the lower extremities Neuro-alert, oriented x3, no focal deficit noted Discharge Instructions   Discharge Instructions    Diet - low sodium heart healthy   Complete by:  As directed    Increase activity slowly   Complete by:  As directed      Allergies as of 03/06/2019   No Known Allergies     Medication List    STOP taking these medications   acyclovir 400 MG tablet Commonly known as:  ZOVIRAX   cephALEXin 500 MG capsule Commonly known as:  KEFLEX   furosemide 20 MG tablet Commonly known as:  LASIX   LORazepam 0.5 MG tablet Commonly known as:  Ativan   omeprazole 40 MG capsule Commonly known as:  PRILOSEC   ondansetron 8 MG disintegrating tablet Commonly known as:  Zofran ODT   ondansetron 8 MG tablet Commonly known as:  Zofran   potassium chloride SA 20 MEQ tablet Commonly known as:  K-DUR     TAKE these medications   acetaminophen 500 MG tablet Commonly known as:  TYLENOL Take 500 mg by mouth every 6 (six) hours as needed for moderate pain or headache.   amiodarone 200 MG tablet Commonly known as:  PACERONE Take 1 tablet (200 mg total) by mouth 2 (two) times daily.  bisacodyl 5 MG EC tablet Commonly known as:  DULCOLAX Take 5 mg by mouth daily as needed for moderate constipation.   carvedilol 3.125 MG tablet Commonly known as:  COREG Take 1 tablet (3.125 mg total) by mouth 2 (two) times daily with a meal.   diclofenac sodium 1 % Gel Commonly known as:  VOLTAREN Apply 2 g topically 3  (three) times daily as needed (knee pain).   Eliquis 5 MG Tabs tablet Generic drug:  apixaban Take 5 mg by mouth 2 (two) times daily.   levothyroxine 112 MCG tablet Commonly known as:  SYNTHROID Take 112 mcg by mouth daily.   lidocaine-prilocaine cream Commonly known as:  EMLA Apply to affected area once   polyvinyl alcohol 1.4 % ophthalmic solution Commonly known as:  LIQUIFILM TEARS Place 1 drop into both eyes as needed for dry eyes.   prochlorperazine 10 MG tablet Commonly known as:  COMPAZINE Take 1 tablet (10 mg total) by mouth every 6 (six) hours as needed (Nausea or vomiting).   sacubitril-valsartan 24-26 MG Commonly known as:  ENTRESTO Take 1 tablet by mouth 2 (two) times daily.      No Known Allergies Follow-up Information    Croitoru, Mihai, MD Follow up on 03/14/2019.   Specialty:  Cardiology Why:  at 1000 - with his PA [place].  the nurse will call 15 min or so ahead of appt to record BP and pulse.   Contact information: [address] [place] [address] [phone]            The results of significant diagnostics from this hospitalization (including imaging, microbiology, ancillary and laboratory) are listed below for reference.    Significant Diagnostic Studies: Ct Head Wo Contrast  Result Date: 03/04/2019 CLINICAL DATA:  Headache and weakness for 2 weeks EXAM: CT HEAD WITHOUT CONTRAST TECHNIQUE: Contiguous axial images were obtained from the base of the skull through the vertex without intravenous contrast. COMPARISON:  October 18, 2013 FINDINGS: Brain: No subdural, epidural, or subarachnoid hemorrhage. Ventricles and sulci are unremarkable. Cerebellum, brainstem, and basal cisterns are normal. No mass effect or midline shift. Scattered white matter changes have increased since 2015 but remain moderate. No acute cortical ischemia or infarct. Vascular: Atherosclerotic changes are seen in the intracranial carotids. Skull: Normal.  Negative for fracture or focal lesion. Sinuses/Orbits: No acute finding. Other: None. IMPRESSION: 1. Increasing chronic white matter changes compared to 2015. No acute intracranial abnormalities are noted. Electronically Signed   By: Dorise Bullion III M.D   On: 03/04/2019 14:22   Dg Chest Port 1 View  Result Date: 03/04/2019 CLINICAL DATA:  Pain. EXAM: PORTABLE CHEST 1 VIEW COMPARISON:  Feb 25, 2019 FINDINGS: Stable right Port-A-Cath. No pneumothorax. Stable cardiomegaly. The hila and mediastinum are normal. No pulmonary nodules, masses, or focal infiltrates. IMPRESSION: No active disease. Electronically Signed   By: Dorise Bullion III M.D   On: 03/04/2019 13:29   Dg Chest Port 1 View  Result Date: 02/25/2019 CLINICAL DATA:  Chest pain, weakness EXAM: PORTABLE CHEST 1 VIEW COMPARISON:  02/16/2019 FINDINGS: Unchanged AP portable examination with cardiomegaly. No airspace opacity. Right chest port catheter. Left chest loop recorder. IMPRESSION: Unchanged AP portable examination with cardiomegaly. No airspace opacity. Right chest port catheter. Left chest loop recorder. Electronically Signed   By: Eddie Candle M.D.   On: 02/25/2019 13:09   Dg Chest Port 1 View  Result Date: 02/16/2019 CLINICAL DATA:  Progressive shortness of breath. EXAM: PORTABLE CHEST  1 VIEW COMPARISON:  CT chest dated January 30, 2019. Chest x-ray dated October 10, 2018. FINDINGS: Unchanged right chest wall port catheter. Stable cardiomegaly. Normal pulmonary vascularity. Minimal bibasilar atelectasis. No focal consolidation, pleural effusion, or pneumothorax. No acute osseous abnormality. IMPRESSION: No active disease. Electronically Signed   By: Titus Dubin M.D.   On: 02/16/2019 13:16    Microbiology: Recent Results (from the past 240 hour(s))  SARS Coronavirus 2 (CEPHEID - Performed in [place] hospital lab), Hosp Order     Status: None   Collection Time: 02/25/19  2:30 PM  Result Value Ref Range Status   SARS  Coronavirus 2 NEGATIVE NEGATIVE Final    Comment: (NOTE) If result is NEGATIVE SARS-CoV-2 target nucleic acids are NOT DETECTED. The SARS-CoV-2 RNA is generally detectable in upper and lower  respiratory specimens during the acute phase of infection. The lowest  concentration of SARS-CoV-2 viral copies this assay can detect is 250  copies / mL. A negative result does not preclude SARS-CoV-2 infection  and should not be used as the sole basis for treatment or other  patient management decisions.  A negative result may occur with  improper specimen collection / handling, submission of specimen other  than nasopharyngeal swab, presence of viral mutation(s) within the  areas targeted by this assay, and inadequate number of viral copies  (<250 copies / mL). A negative result must be combined with clinical  observations, patient history, and epidemiological information. If result is POSITIVE SARS-CoV-2 target nucleic acids are DETECTED. The SARS-CoV-2 RNA is generally detectable in upper and lower  respiratory specimens dur ing the acute phase of infection.  Positive  results are indicative of active infection with SARS-CoV-2.  Clinical  correlation with patient history and other diagnostic information is  necessary to determine patient infection status.  Positive results do  not rule out bacterial infection or co-infection with other viruses. If result is PRESUMPTIVE POSTIVE SARS-CoV-2 nucleic acids MAY BE PRESENT.   A presumptive positive result was obtained on the submitted specimen  and confirmed on repeat testing.  While 2019 novel coronavirus  (SARS-CoV-2) nucleic acids may be present in the submitted sample  additional confirmatory testing may be necessary for epidemiological  and / or clinical management purposes  to differentiate between  SARS-CoV-2 and other Sarbecovirus currently known to infect humans.  If clinically indicated additional testing with an alternate test   methodology [id number]) is advised. The SARS-CoV-2 RNA is generally  detectable in upper and lower respiratory sp ecimens during the acute  phase of infection. The expected result is Negative. Fact Sheet for Patients:  StrictlyIdeas.no Fact Sheet for Healthcare Providers: BankingDealers.co.za This test is not yet approved or cleared by the [place] FDA and has been authorized for detection and/or diagnosis of SARS-CoV-2 by FDA under an Emergency Use Authorization (EUA).  This EUA will remain in effect (meaning this test can be used) for the duration of the COVID-19 declaration under Section 564(b)(1) of the Act, 21 U.S.C. section 360bbb-3(b)(1), unless the authorization is terminated or revoked sooner. Performed at [facility], [place] [address], [place], [place] [address]   Urine culture     Status: None   Collection Time: 03/04/19  4:42 PM  Result Value Ref Range Status   Specimen Description   Final    URINE, RANDOM Performed at [facility] [address] [place], [place] [address]    Special Requests   Final    NONE Performed  at [facility], [place] [address], [place], [place] [address]    Culture   Final    NO GROWTH Performed at [facility], [place] [address] [place], [place] [address]    Report Status 03/05/2019 FINAL  Final  SARS Coronavirus 2 (CEPHEID - Performed in [place] hospital lab), Hosp Order     Status: None   Collection Time: 03/04/19  7:29 PM  Result Value Ref Range Status   SARS Coronavirus 2 NEGATIVE NEGATIVE Final    Comment: (NOTE) If result is NEGATIVE SARS-CoV-2 target nucleic acids are NOT DETECTED. The SARS-CoV-2 RNA is generally detectable in upper and lower  respiratory specimens during the acute phase of infection. The lowest  concentration of SARS-CoV-2 viral copies this assay can detect is 250  copies / mL. A negative result  does not preclude SARS-CoV-2 infection  and should not be used as the sole basis for treatment or other  patient management decisions.  A negative result may occur with  improper specimen collection / handling, submission of specimen other  than nasopharyngeal swab, presence of viral mutation(s) within the  areas targeted by this assay, and inadequate number of viral copies  (<250 copies / mL). A negative result must be combined with clinical  observations, patient history, and epidemiological information. If result is POSITIVE SARS-CoV-2 target nucleic acids are DETECTED. The SARS-CoV-2 RNA is generally detectable in upper and lower  respiratory specimens dur ing the acute phase of infection.  Positive  results are indicative of active infection with SARS-CoV-2.  Clinical  correlation with patient history and other diagnostic information is  necessary to determine patient infection status.  Positive results do  not rule out bacterial infection or co-infection with other viruses. If result is PRESUMPTIVE POSTIVE SARS-CoV-2 nucleic acids MAY BE PRESENT.   A presumptive positive result was obtained on the submitted specimen  and confirmed on repeat testing.  While 2019 novel coronavirus  (SARS-CoV-2) nucleic acids may be present in the submitted sample  additional confirmatory testing may be necessary for epidemiological  and / or clinical management purposes  to differentiate between  SARS-CoV-2 and other Sarbecovirus currently known to infect humans.  If clinically indicated additional testing with an alternate test  methodology [id number]) is advised. The SARS-CoV-2 RNA is generally  detectable in upper and lower respiratory sp ecimens during the acute  phase of infection. The expected result is Negative. Fact Sheet for Patients:  StrictlyIdeas.no Fact Sheet for Healthcare Providers: BankingDealers.co.za This test is not yet approved or  cleared by the [place] FDA and has been authorized for detection and/or diagnosis of SARS-CoV-2 by FDA under an Emergency Use Authorization (EUA).  This EUA will remain in effect (meaning this test can be used) for the duration of the COVID-19 declaration under Section 564(b)(1) of the Act, 21 U.S.C. section 360bbb-3(b)(1), unless the authorization is terminated or revoked sooner. Performed at [facility], [place] [address], [place], [place] [address]      Labs: Basic Metabolic Panel: Recent Labs  Lab 02/28/19 0356 03/04/19 1222 03/05/19 0452  NA 138 137 138  K 3.7 4.5 3.9  CL 105 105 106  CO2 23 23 24   GLUCOSE 111* 105* 98  BUN 28* 36* 31*  CREATININE 1.31* 1.41* 1.38*  CALCIUM 8.5* 9.0 8.8*  MG  --  2.3 2.1  PHOS  --   --  4.1   Liver Function Tests: Recent Labs  Lab 03/04/19 1222  AST 17  ALT 14  ALKPHOS 74  BILITOT 1.2  PROT 6.9  ALBUMIN 4.5   No results for input(s): LIPASE, AMYLASE in the last 168 hours. No results for input(s): AMMONIA in the last 168 hours. CBC: Recent Labs  Lab 03/04/19 1222 03/05/19 0452  WBC 6.6 3.8*  NEUTROABS 5.3  --   HGB 13.8 13.1  HCT 41.8 39.7  MCV 98.4 98.5  PLT 204 174   Cardiac Enzymes: Recent Labs  Lab 03/04/19 1222 03/04/19 1644  TROPONINI <0.03 <0.03   BNP: BNP (last 3 results) Recent Labs    02/16/19 1226 02/25/19 1243 03/04/19 1222  BNP 1,207.1* 979.1* 207.3*       Signed:  Oswald Hillock MD.  [facility] 03/06/2019, 12:06 PM

## 2019-03-06 NOTE — TOC Initial Note (Signed)
Transition of Care [facility]) - Initial/Assessment Note    Patient Details  Name: Carrie [name] MRN: [id number] Date of Birth: [birth year]  Transition of Care [facility]) CM/SW Contact:    Purcell Mouton, RN Phone Number: 03/06/2019, 1:27 PM  Clinical Narrative:                   Expected Discharge Plan: Home/Self Care     Patient Goals and CMS Choice        Expected Discharge Plan and Services Expected Discharge Plan: Home/Self Care       Living arrangements for the past 2 months: Single Family Home Expected Discharge Date: 03/06/19                                    Prior Living Arrangements/Services Living arrangements for the past 2 months: Single Family Home Lives with:: Spouse Patient language and need for interpreter reviewed:: No Do you feel safe going back to the place where you live?: Yes               Activities of Daily Living Home Assistive Devices/Equipment: Eyeglasses, Cane (specify quad or straight), Walker (specify type) ADL Screening (condition at time of admission) Patient's cognitive ability adequate to safely complete daily activities?: Yes Is the patient deaf or have difficulty hearing?: No Does the patient have difficulty seeing, even when wearing glasses/contacts?: No Does the patient have difficulty concentrating, remembering, or making decisions?: Yes Patient able to express need for assistance with ADLs?: Yes Does the patient have difficulty dressing or bathing?: No Independently performs ADLs?: Yes (appropriate for developmental age) Does the patient have difficulty walking or climbing stairs?: Yes Weakness of Legs: Both Weakness of Arms/Hands: None  Permission Sought/Granted                  Emotional Assessment     Affect (typically observed): Accepting Orientation: : Oriented to Self, Oriented to Place, Oriented to  Time, Oriented to Situation      Admission diagnosis:  Atypical chest pain [R07.89] Near  syncope [R55] Nonintractable episodic headache, unspecified headache type [R51] Patient Active Problem List   Diagnosis Date Noted  . Syncope 03/05/2019  . Postural dizziness with presyncope 03/04/2019  . Atypical chest pain   . Near syncope   . Acute systolic congestive heart failure ([place])   . Atrial fibrillation with RVR ([place]) 02/25/2019  . GERD with esophagitis 09/19/2018  . Skin lesions 06/06/2017  . Dyspnea on exertion 03/25/2017  . Anxiety disorder 03/25/2017  . Generalized weakness 03/19/2017  . Insomnia disorder 03/19/2017  . Dysuria 08/31/2016  . Acute back pain with sciatica, right 08/31/2016  . Chronic back pain 07/02/2016  . Quality of life palliative care encounter 03/02/2016  . Port catheter in place 03/01/2016  . Chronic atrial fibrillation 09/08/2015  . Essential hypertension 07/02/2015  . Diabetes mellitus, likely due to steroids 06/27/2015  . Chronic kidney disease, stage III (moderate) ([place]) 06/25/2015  . PAF (paroxysmal atrial fibrillation) ([place]) 06/25/2015  . Atrial fibrillation with rapid ventricular response ([place]) 06/25/2015  . Other fatigue 03/07/2015  . Grade 3a follicular lymphoma of lymph nodes of multiple regions ([place]) 02/03/2015  . Hypothyroidism 09/09/2009   PCP:  Reubin Milan, MD Pharmacy:   [place], [place] - [phone] [place] [address] Phone: [phone] Fax: [phone]     Social  Determinants of Health (SDOH) Interventions    Readmission Risk Interventions No flowsheet data found.

## 2019-03-13 ENCOUNTER — Telehealth: Payer: Self-pay

## 2019-03-13 NOTE — Telephone Encounter (Signed)
   TELEPHONE CALL NOTE  This patient has been deemed a candidate for follow-up tele-health visit to limit community exposure during the Covid-19 pandemic. I spoke with the patient via phone to discuss instructions.  A Virtual Office Visit appointment type has been scheduled for 5/27 with Lyda Jester, PA, with "VIDEO" or "TELEPHONE" in the appointment notes - patient prefers Phone type.   Frederik Schmidt, RN 03/13/2019 4:56 PM

## 2019-03-14 ENCOUNTER — Telehealth (INDEPENDENT_AMBULATORY_CARE_PROVIDER_SITE_OTHER): Payer: Medicare HMO | Admitting: Cardiology

## 2019-03-14 ENCOUNTER — Telehealth: Payer: Self-pay

## 2019-03-14 ENCOUNTER — Other Ambulatory Visit: Payer: Self-pay

## 2019-03-14 ENCOUNTER — Encounter: Payer: Self-pay | Admitting: Cardiology

## 2019-03-14 VITALS — BP 117/79 | HR 60 | Ht 61.0 in | Wt 136.6 lb

## 2019-03-14 DIAGNOSIS — Z7901 Long term (current) use of anticoagulants: Secondary | ICD-10-CM

## 2019-03-14 DIAGNOSIS — I5022 Chronic systolic (congestive) heart failure: Secondary | ICD-10-CM

## 2019-03-14 DIAGNOSIS — I480 Paroxysmal atrial fibrillation: Secondary | ICD-10-CM

## 2019-03-14 MED ORDER — AMIODARONE HCL 200 MG PO TABS: 200.0000 mg | ORAL_TABLET | Freq: Every day | ORAL | 3 refills | Status: DC

## 2019-03-14 NOTE — Patient Instructions (Signed)
Medication Instructions: June 1 Start Amiodarone 200 mg once DAILY If you need a refill on your cardiac medications before your next appointment, please call your pharmacy.   Lab work: None If you have labs (blood work) drawn today and your tests are completely normal, you will receive your results only by: Marland Kitchen MyChart Message (if you have MyChart) OR . A paper copy in the mail If you have any lab test that is abnormal or we need to change your treatment, we will call you to review the results.  Testing/Procedures: None  Follow-Up: 4-6 WEEKS Video visit with Dr Meda Coffee [facility], you and your health needs are our priority.  As part of our continuing mission to provide you with exceptional heart care, we have created designated Provider Care Teams.  These Care Teams include your primary Cardiologist (physician) and Advanced Practice Providers (APPs -  Physician Assistants and Nurse Practitioners) who all work together to provide you with the care you need, when you need it. .   Any Other Special Instructions Will Be Listed Below (If Applicable).  WEIGH DAILY:  If you gain more than 3 lbs in 1 day or 5 lbs in 1 week, call our office.  [phone]  If you have any cardiac symptoms or fluid retention, call our office.   DASH Eating Plan DASH stands for "Dietary Approaches to Stop Hypertension." The DASH eating plan is a healthy eating plan that has been shown to reduce high blood pressure (hypertension). It may also reduce your risk for type 2 diabetes, heart disease, and stroke. The DASH eating plan may also help with weight loss. What are tips for following this plan?  General guidelines  Avoid eating more than 2,300 mg (milligrams) of salt (sodium) a day. If you have hypertension, you may need to reduce your sodium intake to 1,500 mg a day.  Limit alcohol intake to no more than 1 drink a day for nonpregnant women and 2 drinks a day for men. One drink equals 12 oz of beer, 5 oz  of wine, or 1 oz of hard liquor.  Work with your health care provider to maintain a healthy body weight or to lose weight. Ask what an ideal weight is for you.  Get at least 30 minutes of exercise that causes your heart to beat faster (aerobic exercise) most days of the week. Activities may include walking, swimming, or biking.  Work with your health care provider or diet and nutrition specialist (dietitian) to adjust your eating plan to your individual calorie needs. Reading food labels   Check food labels for the amount of sodium per serving. Choose foods with less than 5 percent of the Daily Value of sodium. Generally, foods with less than 300 mg of sodium per serving fit into this eating plan.  To find whole grains, look for the word "whole" as the first word in the ingredient list. Shopping  Buy products labeled as "low-sodium" or "no salt added."  Buy fresh foods. Avoid canned foods and premade or frozen meals. Cooking  Avoid adding salt when cooking. Use salt-free seasonings or herbs instead of table salt or sea salt. Check with your health care provider or pharmacist before using salt substitutes.  Do not fry foods. Cook foods using healthy methods such as baking, boiling, grilling, and broiling instead.  Cook with heart-healthy oils, such as olive, canola, soybean, or sunflower oil. Meal planning  Eat a balanced diet that includes: ? 5 or more servings of fruits  and vegetables each day. At each meal, try to fill half of your plate with fruits and vegetables. ? Up to 6-8 servings of whole grains each day. ? Less than 6 oz of lean meat, poultry, or fish each day. A 3-oz serving of meat is about the same size as a deck of cards. One egg equals 1 oz. ? 2 servings of low-fat dairy each day. ? A serving of nuts, seeds, or beans 5 times each week. ? Heart-healthy fats. Healthy fats called Omega-3 fatty acids are found in foods such as flaxseeds and coldwater fish, like sardines,  salmon, and mackerel.  Limit how much you eat of the following: ? Canned or prepackaged foods. ? Food that is high in trans fat, such as fried foods. ? Food that is high in saturated fat, such as fatty meat. ? Sweets, desserts, sugary drinks, and other foods with added sugar. ? Full-fat dairy products.  Do not salt foods before eating.  Try to eat at least 2 vegetarian meals each week.  Eat more home-cooked food and less restaurant, buffet, and fast food.  When eating at a restaurant, ask that your food be prepared with less salt or no salt, if possible. What foods are recommended? The items listed may not be a complete list. Talk with your dietitian about what dietary choices are best for you. Grains Whole-grain or whole-wheat bread. Whole-grain or whole-wheat pasta. Brown rice. [place]. Bulgur. Whole-grain and low-sodium cereals. Pita bread. Low-fat, low-sodium crackers. Whole-wheat flour tortillas. Vegetables Fresh or frozen vegetables (raw, steamed, roasted, or grilled). Low-sodium or reduced-sodium tomato and vegetable juice. Low-sodium or reduced-sodium tomato sauce and tomato paste. Low-sodium or reduced-sodium canned vegetables. Fruits All fresh, dried, or frozen fruit. Canned fruit in natural juice (without added sugar). Meat and other protein foods Skinless chicken or [place]. Ground chicken or [place]. Pork with fat trimmed off. Fish and seafood. Egg whites. Dried beans, peas, or lentils. Unsalted nuts, nut butters, and seeds. Unsalted canned beans. Lean cuts of beef with fat trimmed off. Low-sodium, lean deli meat. Dairy Low-fat (1%) or fat-free (skim) milk. Fat-free, low-fat, or reduced-fat cheeses. Nonfat, low-sodium ricotta or cottage cheese. Low-fat or nonfat yogurt. Low-fat, low-sodium cheese. Fats and oils Soft margarine without trans fats. Vegetable oil. Low-fat, reduced-fat, or light mayonnaise and salad dressings (reduced-sodium). Canola, safflower, olive,  soybean, and sunflower oils. Avocado. Seasoning and other foods Herbs. Spices. Seasoning mixes without salt. Unsalted popcorn and pretzels. Fat-free sweets. What foods are not recommended? The items listed may not be a complete list. Talk with your dietitian about what dietary choices are best for you. Grains Baked goods made with fat, such as croissants, muffins, or some breads. Dry pasta or rice meal packs. Vegetables Creamed or fried vegetables. Vegetables in a cheese sauce. Regular canned vegetables (not low-sodium or reduced-sodium). Regular canned tomato sauce and paste (not low-sodium or reduced-sodium). Regular tomato and vegetable juice (not low-sodium or reduced-sodium). Angie Fava. Olives. Fruits Canned fruit in a light or heavy syrup. Fried fruit. Fruit in cream or butter sauce. Meat and other protein foods Fatty cuts of meat. Ribs. Fried meat. Berniece Salines. Sausage. Bologna and other processed lunch meats. Salami. Fatback. Hotdogs. Bratwurst. Salted nuts and seeds. Canned beans with added salt. Canned or smoked fish. Whole eggs or egg yolks. Chicken or [place] with skin. Dairy Whole or 2% milk, cream, and half-and-half. Whole or full-fat cream cheese. Whole-fat or sweetened yogurt. Full-fat cheese. Nondairy creamers. Whipped toppings. Processed cheese and cheese spreads. Fats and  oils Butter. Stick margarine. Lard. Shortening. Ghee. Bacon fat. Tropical oils, such as coconut, palm kernel, or palm oil. Seasoning and other foods Salted popcorn and pretzels. Onion salt, garlic salt, seasoned salt, table salt, and sea salt. Worcestershire sauce. Tartar sauce. Barbecue sauce. Teriyaki sauce. Soy sauce, including reduced-sodium. Steak sauce. Canned and packaged gravies. Fish sauce. Oyster sauce. Cocktail sauce. Horseradish that you find on the shelf. Ketchup. Mustard. Meat flavorings and tenderizers. Bouillon cubes. Hot sauce and Tabasco sauce. Premade or packaged marinades. Premade or packaged taco  seasonings. Relishes. Regular salad dressings. Where to find more information:  National Heart, Lung, and [place]: https://wilson-eaton.com/  American Heart Association: www.heart.org Summary  The DASH eating plan is a healthy eating plan that has been shown to reduce high blood pressure (hypertension). It may also reduce your risk for type 2 diabetes, heart disease, and stroke.  With the DASH eating plan, you should limit salt (sodium) intake to 2,300 mg a day. If you have hypertension, you may need to reduce your sodium intake to 1,500 mg a day.  When on the DASH eating plan, aim to eat more fresh fruits and vegetables, whole grains, lean proteins, low-fat dairy, and heart-healthy fats.  Work with your health care provider or diet and nutrition specialist (dietitian) to adjust your eating plan to your individual calorie needs. This information is not intended to replace advice given to you by your health care provider. Make sure you discuss any questions you have with your health care provider. Document Released: 09/23/2011 Document Revised: 09/27/2016 Document Reviewed: 09/27/2016 Elsevier Interactive Patient Education  2019 [employer].

## 2019-03-14 NOTE — Telephone Encounter (Signed)
YOUR CARDIOLOGY TEAM HAS ARRANGED FOR AN E-VISIT FOR YOUR APPOINTMENT - PLEASE REVIEW IMPORTANT INFORMATION BELOW SEVERAL DAYS PRIOR TO YOUR APPOINTMENT  Due to the recent COVID-19 pandemic, we are transitioning in-person office visits to tele-medicine visits in an effort to decrease unnecessary exposure to our patients, their families, and staff. These visits are billed to your insurance just like a normal visit is. We also encourage you to sign up for MyChart if you have not already done so. You will need a smartphone if possible. For patients that do not have this, we can still complete the visit using a regular telephone but do prefer a smartphone to enable video when possible. You may have a family member that lives with you that can help. If possible, we also ask that you have a blood pressure cuff and scale at home to measure your blood pressure, heart rate and weight prior to your scheduled appointment. Patients with clinical needs that need an in-person evaluation and testing will still be able to come to the office if absolutely necessary. If you have any questions, feel free to call our office.     YOUR PROVIDER WILL BE USING THE FOLLOWING PLATFORM TO COMPLETE YOUR VISIT: Doximity  . IF USING MYCHART - How to Download the MyChart App to Your SmartPhone   - If Apple, go to App Store and type in MyChart in the search bar and download the app. If Android, ask patient to go to Google Play Store and type in MyChart in the search bar and download the app. The app is free but as with any other app downloads, your phone may require you to verify saved payment information or Apple/Android password.  - You will need to then log into the app with your MyChart username and password, and select [facility] as your healthcare provider to link the account.  - When it is time for your visit, go to the MyChart app, find appointments, and click Begin Video Visit. Be sure to Select Allow for your device to  access the Microphone and Camera for your visit. You will then be connected, and your provider will be with you shortly.  **If you have any issues connecting or need assistance, please contact MyChart service desk [phone] ([phone])**  **If using a computer, in order to ensure the best quality for your visit, you will need to use either of the following Internet Browsers: Google Chrome or Microsoft Edge**  . IF USING DOXIMITY or DOXY.ME - The staff will give you instructions on receiving your link to join the meeting the day of your visit.      2-3 DAYS BEFORE YOUR APPOINTMENT  You will receive a telephone call from one of our HeartCare team members - your caller ID may say "Unknown caller." If this is a video visit, we will walk you through how to get the video launched on your phone. We will remind you check your blood pressure, heart rate and weight prior to your scheduled appointment. If you have an Apple Watch or Kardia, please upload any pertinent ECG strips the day before or morning of your appointment to MyChart. Our staff will also make sure you have reviewed the consent and agree to move forward with your scheduled tele-health visit.     THE DAY OF YOUR APPOINTMENT  Approximately 15 minutes prior to your scheduled appointment, you will receive a telephone call from one of HeartCare team - your caller ID may say "Unknown caller."    Our staff will confirm medications, vital signs for the day and any symptoms you may be experiencing. Please have this information available prior to the time of visit start. It may also be helpful for you to have a pad of paper and pen handy for any instructions given during your visit. They will also walk you through joining the smartphone meeting if this is a video visit.    CONSENT FOR TELE-HEALTH VISIT - PLEASE REVIEW  I hereby voluntarily request, consent and authorize [facility] and its employed or contracted physicians, physician  assistants, nurse practitioners or other licensed health care professionals (the Practitioner), to provide me with telemedicine health care services (the "Services") as deemed necessary by the treating Practitioner. I acknowledge and consent to receive the Services by the Practitioner via telemedicine. I understand that the telemedicine visit will involve communicating with the Practitioner through live audiovisual communication technology and the disclosure of certain medical information by electronic transmission. I acknowledge that I have been given the opportunity to request an in-person assessment or other available alternative prior to the telemedicine visit and am voluntarily participating in the telemedicine visit.  I understand that I have the right to withhold or withdraw my consent to the use of telemedicine in the course of my care at any time, without affecting my right to future care or treatment, and that the Practitioner or I may terminate the telemedicine visit at any time. I understand that I have the right to inspect all information obtained and/or recorded in the course of the telemedicine visit and may receive copies of available information for a reasonable fee.  I understand that some of the potential risks of receiving the Services via telemedicine include:  . Delay or interruption in medical evaluation due to technological equipment failure or disruption; . Information transmitted may not be sufficient (e.g. poor resolution of images) to allow for appropriate medical decision making by the Practitioner; and/or  . In rare instances, security protocols could fail, causing a breach of personal health information.  Furthermore, I acknowledge that it is my responsibility to provide information about my medical history, conditions and care that is complete and accurate to the best of my ability. I acknowledge that Practitioner's advice, recommendations, and/or decision may be based on  factors not within their control, such as incomplete or inaccurate data provided by me or distortions of diagnostic images or specimens that may result from electronic transmissions. I understand that the practice of medicine is not an exact science and that Practitioner makes no warranties or guarantees regarding treatment outcomes. I acknowledge that I will receive a copy of this consent concurrently upon execution via email to the email address I last provided but may also request a printed copy by calling the office of [facility].    I understand that my insurance will be billed for this visit.   I have read or had this consent read to me. . I understand the contents of this consent, which adequately explains the benefits and risks of the Services being provided via telemedicine.  . I have been provided ample opportunity to ask questions regarding this consent and the Services and have had my questions answered to my satisfaction. . I give my informed consent for the services to be provided through the use of telemedicine in my medical care  By participating in this telemedicine visit I agree to the above.  

## 2019-03-14 NOTE — Progress Notes (Signed)
Virtual Visit via Video Note   This visit type was conducted due to national recommendations for restrictions regarding the COVID-19 Pandemic (e.g. social distancing) in an effort to limit this patient's exposure and mitigate transmission in our community.  Due to her co-morbid illnesses, this patient is at least at moderate risk for complications without adequate follow up.  This format is felt to be most appropriate for this patient at this time.  All issues noted in this document were discussed and addressed.  A limited physical exam was performed with this format.  Please refer to the patient's chart for her consent to telehealth for [facility].   Date:  03/14/2019   ID:  Carrie [name], DOB 06/07/1941, MRN [id number]  Patient Location: Home Provider Location: Home  PCP:  Reubin Milan, MD  Cardiologist:  Ena Dawley, MD  Electrophysiologist:  None   Evaluation Performed:  Follow-Up Visit  Chief Complaint:  [facility] F/u for Orthostatic Hypotension, Atrial Fibrillation and Systolic HF  History of Present Illness:    Carrie [name] is a 78 y.o. female being evaluated for post hospital f/u. She has a PMH of HTN, PAF (s/p recent cardioversion 02/16/7615), follicular lymphoma s/p chemo (last tx 01/25/2019), hypothyroidism, CKD stage 3 and recent diagnosis of systolic HF. Of note, she had a NST in 2017 that was negative for ischemia and echo showed normal EF at that time.   On Feb 16, 2019, she presented to the ED w/ dyspnea and fatigue and was found to be in rapid atrial fibrillation and she required DCCV and was placed on amiodarone. Echo was done and showed significantly reduced LVEF, down to 20-25% (previously normal). This was felt either tachy mediated from rapid afib vs chemo induced. No CP and no plans for ischemic w/u. She did require diuretics for volume overload. Was initially treated w/ IV Lasix then transitioned to PO. She was also placed on guidelines directed medical  therapy w/ Coreg and Entresto.   She was readmitted again 5/17 for near syncope and was found to be orthostatic. Her Coreg and Delene Loll were initially held. Lasix discontinued. Pt monitored. Coreg and Entresto added back and BP normalized and symptoms resolved. Pt was seen during hospitalization by Dr. Angelena Form who recommended that she continue to refrain from Lasix. Pt was discharged home in stable condition.   Today in f/u, she reports that she has done well.  She feels that she is still in sinus rhythm.  She denies any palpitations.  No dyspnea or chest pain.  She also denies orthopnea and PND.  She sleeps with one pillow.  No lower extremity edema.  She has been checking her weight daily at home and denies any weight gain.  She has also been very mindful of her diet and has cut out sodium.  She is cooking all of her meals at home and using fresh and frozen vegetables.  She denies any recurrent syncope/near syncope.  Her blood pressure has been stable at home in the 073X systolic.  Denies lightheadedness and dizziness.  Pulse rate in the 60s.  She reports full medication compliance with Entresto and carvedilol.  Also fully compliant with amiodarone and Eliquis.  She is still on 200 mg of amiodarone twice daily.  She denies any abnormal bleeding with Eliquis.  No falls.  The patient does not have symptoms concerning for COVID-19 infection (fever, chills, cough, or new shortness of breath).    Past Medical History:  Diagnosis Date  .  A-fib ([place])   . Anemia    "years ago"  . Atrial fibrillation with rapid ventricular response ([place]) 06/25/2015  . Carpal tunnel syndrome, bilateral   . Cervical spondylosis without myelopathy 10/25/2013  . Dental bridge present   . DJD (degenerative joint disease)   . Dysrhythmia    WENT INTO A FIB IN 2017   . Elevated troponin 02/18/2015  . Follicular lymphoma grade 3a ([place]) 02/03/2015  . History of echocardiogram    Echo 12/16: EF 60-65%, no RWMA, severe LAE  .  History of nuclear stress test    Myoview 1/17: EF 55%, Normal study. No ischemia or scar.  . Hypothyroid   . Nausea without vomiting 06/20/2015  . Pneumonia   . Stage III chronic kidney disease ([place]) 07/01/2015  . Thrush of mouth and esophagus ([place]) 06/25/2015   Past Surgical History:  Procedure Laterality Date  . ABDOMINAL HYSTERECTOMY    . APPENDECTOMY    . BACK SURGERY     X5-lumbar-fusion  . COLONOSCOPY    . DILATION AND CURETTAGE OF UTERUS    . LYMPH NODE BIOPSY Right 01/21/2015   Procedure: RIGHT GROIN LYMPH NODE BIOPSY;  Surgeon: Erroll Luna, MD;  Location: [place];  Service: General;  Laterality: Right;  . MASS EXCISION Left 08/29/2018   Procedure: EXCISION LEFT BACK  MASS;  Surgeon: Erroll Luna, MD;  Location: [place];  Service: General;  Laterality: Left;  . Ovarian cyst resection    . patelar tendon transplants     Left/right  . PORTACATH PLACEMENT Right 02/13/2015   Procedure: INSERTION PORT-A-CATH WITH ULTRASOUND;  Surgeon: Erroll Luna, MD;  Location: [place];  Service: General;  Laterality: Right;  . PORTACATH PLACEMENT N/A 08/29/2018   Procedure: INSERTION PORT-A-CATH WITH ULTRA SOUND ERAS PATHWAY;  Surgeon: Erroll Luna, MD;  Location: [place];  Service: General;  Laterality: N/A;  . TOTAL KNEE ARTHROPLASTY  2011   Right  . TUBAL LIGATION       Current Meds  Medication Sig  . acetaminophen (TYLENOL) 500 MG tablet Take 500 mg by mouth every 6 (six) hours as needed for moderate pain or headache.  Marland Kitchen apixaban (ELIQUIS) 5 MG TABS tablet Take 5 mg by mouth 2 (two) times daily.   . bisacodyl (DULCOLAX) 5 MG EC tablet Take 5 mg by mouth daily as needed for moderate constipation.  . carvedilol (COREG) 3.125 MG tablet Take 1 tablet (3.125 mg total) by mouth 2 (two) times daily with a meal.  . diclofenac sodium (VOLTAREN) 1 % GEL Apply 2 g topically 3 (three) times daily as needed (knee pain).  Marland Kitchen levothyroxine (SYNTHROID, LEVOTHROID) 112 MCG tablet Take  112 mcg by mouth daily.   Marland Kitchen lidocaine-prilocaine (EMLA) cream Apply to affected area once  . polyvinyl alcohol (LIQUIFILM TEARS) 1.4 % ophthalmic solution Place 1 drop into both eyes as needed for dry eyes.  Marland Kitchen prochlorperazine (COMPAZINE) 10 MG tablet Take 1 tablet (10 mg total) by mouth every 6 (six) hours as needed (Nausea or vomiting).  . sacubitril-valsartan (ENTRESTO) 24-26 MG Take 1 tablet by mouth 2 (two) times daily.  . [DISCONTINUED] amiodarone (PACERONE) 200 MG tablet Take 1 tablet (200 mg total) by mouth 2 (two) times daily.     Allergies:   Patient has no known allergies.   Social History   Tobacco Use  . Smoking status: Never Smoker  . Smokeless tobacco: Never Used  Substance Use Topics  . Alcohol use: Yes  Comment: occassional wine  . Drug use: No     Family Hx: The patient's family history includes Cancer in her mother and sister.  ROS:   Please see the history of present illness.    All other systems reviewed and are negative.   Prior CV studies:   The following studies were reviewed today:  Echo 02/25/19 IMPRESSIONS   1. The left ventricle has severely reduced systolic function, with an ejection fraction of 20-25%. The cavity size was normal. Left ventricular diastolic function could not be evaluated secondary to atrial fibrillation. Left ventricular diffuse  hypokinesis. 2. The right ventricle has severely reduced systolic function. The cavity was mildly enlarged. There is no increase in right ventricular wall thickness. Right ventricular systolic pressure is mildly elevated with an estimated pressure of 23.5 mmHg. 3. Left atrial size was mildly dilated. 4. Right atrial size was mildly dilated. 5. Trivial pericardial effusion is present. 6. The mitral valve is grossly normal. Mild thickening of the mitral valve leaflet. Mitral valve regurgitation is moderate by color flow Doppler. 7. The aortic valve is tricuspid. Aortic valve regurgitation is  trivial by color flow Doppler. No stenosis of the aortic valve.  SUMMARY  When compared to the prior study from 2016 there is a significant change, LVEF has decreased from 50-55% to 15-20% with diffuse hypokinesis. RVEF is severely decreased.  Labs/Other Tests and Data Reviewed:    EKG:  An ECG dated 03/04/19 was personally reviewed today and demonstrated:  NSR  Recent Labs: 03/04/2019: ALT 14; B Natriuretic Peptide 207.3 03/05/2019: BUN 31; Creatinine, Ser 1.38; Hemoglobin 13.1; Magnesium 2.1; Platelets 174; Potassium 3.9; Sodium 138; TSH 1.333   Recent Lipid Panel No results found for: CHOL, TRIG, HDL, CHOLHDL, LDLCALC, LDLDIRECT  Wt Readings from Last 3 Encounters:  03/14/19 136 lb 9.6 oz (62 kg)  03/04/19 137 lb 9.1 oz (62.4 kg)  02/28/19 135 lb 11.2 oz (61.6 kg)     Objective:    Vital Signs:  BP 117/79   Pulse 60   Ht 5\' 1"  (1.549 m)   Wt 136 lb 9.6 oz (62 kg)   BMI 25.81 kg/m    VITAL SIGNS:  reviewed GEN:  no acute distress EYES:  sclerae anicteric, EOMI - Extraocular Movements Intact RESPIRATORY:  normal respiratory effort, symmetric expansion CARDIOVASCULAR:  no peripheral edema SKIN:  no rash, lesions or ulcers. MUSCULOSKELETAL:  no obvious deformities. NEURO:  alert and oriented x 3, no obvious focal deficit PSYCH:  normal affect  ASSESSMENT & PLAN:    1. Systolic heart failure: New diagnosis.  Recent echocardiogram showed reduced EF down to 20 to 25% (previously normal).  Etiology felt to be likely nonischemic, tachycardia mediated secondary to rapid atrial fibrillation versus chemotherapy induced.  She has not had any recent anginal symptomatology and there are no plans for ischemic work-up at this time.  She denies any current dyspnea.  No orthopnea, PND or lower extremity edema and no weight gain.  Continue guidelines directed medical therapy with Entresto and Coreg.  Lasix previously discontinued due to orthostatic hypotension.  As noted above, her volume  has remained stable.  She will continue to check her weight daily and will notify our office if any significant weight gain, greater than 3 pounds in 24 hours or more than 5 pounds in the course of 1 week.  She will continue to refrain from sodium intake.  We will plan to continue current dose of both Coreg and Entresto as her blood  pressure is soft.  Unable to tolerate further titration.  Will reassess in 4 to 6 weeks and will try further titration if blood pressure and heart rate allows.  2.  Atrial fibrillation: Status post direct-current cardioversion Feb 16, 2019.  Placed on amiodarone.  On Eliquis for anticoagulation.  She feels that she is currently in sinus rhythm.  She denies any palpitations.  No dyspnea.  Heart rate well controlled in the low 60s.  She is currently on 200 mg of amiodarone twice daily.  We will continue this dose for another week.  Starting June 1, she will reduce dose down to 200 mg daily.  We will continue Eliquis for stroke prophylaxis.  She denies any abnormal bleeding and no falls or balance issues.  3.  Near syncope secondary to orthostatic hypotension: Felt secondary to overdiuresis.  Patient was previously on Lasix as well as Entresto.  She remains on Entresto but Lasix was discontinued.  Symptoms resolved.  No recurrence.  Blood pressure stable.  No symptoms with positional changes.  4. Follicular lymphoma: s/p chemo (last tx 01/25/2019)  5.  Hypertension: Controlled on current regimen.  COVID-19 Education: The signs and symptoms of COVID-19 were discussed with the patient and how to seek care for testing (follow up with PCP or arrange E-visit).  The importance of social distancing was discussed today.  Time:   Today, I have spent 25 minutes with the patient with telehealth technology discussing the above problems.     Medication Adjustments/Labs and Tests Ordered: Current medicines are reviewed at length with the patient today.  Concerns regarding medicines are  outlined above.   Tests Ordered: No orders of the defined types were placed in this encounter.   Medication Changes: Meds ordered this encounter  Medications  . amiodarone (PACERONE) 200 MG tablet    Sig: Take 1 tablet (200 mg total) by mouth daily.    Dispense:  90 tablet    Refill:  3    Disposition:  Follow up w/ Dr. Meda Coffee in 4-6 weeks  Signed, Lyda Jester, PA-C  03/14/2019 10:35 AM    [place]

## 2019-03-19 ENCOUNTER — Telehealth: Payer: Self-pay | Admitting: Cardiology

## 2019-03-19 NOTE — Telephone Encounter (Signed)
I spoke to the patient who is concerned because her BP has been low and she's felt tired.  Her BP at 9:20 this morning was 96/57 with HR of 52.  She hydrated herself and ate breakfast.    At 12:15 pm, her BP was 129/77 with HR of 67.  She stated that last night 5/31, she urinated 6 times between 11:30 pm-8:30 am.  We discussed that this may of played a role.  She will check BP tonight and tomorrow morning and report to [place].

## 2019-03-19 NOTE — Telephone Encounter (Signed)
I tried calling patient, no answer, VM not set up. 6/1

## 2019-03-19 NOTE — Telephone Encounter (Signed)
New Message  Pt c/o BP issue: STAT if pt c/o blurred vision, one-sided weakness or slurred speech  1. What are your last 5 BP readings? Today 96/57 Hr-52, 03/18/19 106/77 HR-63, 03/17/19 104/67 HR-63, 03/16/19 104/67 HR-64, 03/15/19 121/74 HR-64, 03/14/19 117/79 HR-60  2. Are you having any other symptoms (ex. Dizziness, headache, blurred vision, passed out)? Weak this morning   3. What is your BP issue? Patient calling because blood pressure has been low.

## 2019-03-19 NOTE — Telephone Encounter (Signed)
  Patient is calling back. Please call

## 2019-03-20 ENCOUNTER — Telehealth: Payer: Self-pay | Admitting: Cardiology

## 2019-03-20 ENCOUNTER — Telehealth: Payer: Self-pay

## 2019-03-20 NOTE — Telephone Encounter (Signed)
Spoke with pt by phone to move appts to 04/30/19. Pt aware of new date/time

## 2019-03-20 NOTE — Telephone Encounter (Signed)
Spoke with the pt and informed her that per Dr Meda Coffee, she agrees with she should continue taking her Eliquis and amiodarone today, continue hydration, restart her carvedilol tomorrow, and continue to monitor her HR and BP, and report as needed.  Pt verbalized understanding and agrees with this plan.

## 2019-03-20 NOTE — Telephone Encounter (Signed)
Dr. Meda Coffee pt calling to give you and Ellen Henri PA-C an update on her BP and HR readings this morning.  Please refer to Brittany's covering nurse Legrand Como, with his conversation he had with this  pt yesterday.  Note is  copied in this telephone encounter. Please advise if her readings are acceptable, for yesterday she was slightly hypotensive, and should she have her med regimen changed or continue her regimen as is? Please advise.  Will also route this conversation to [place] and Demaris Callander, for they requested the pt to call the office back this morning to report her current readings.

## 2019-03-20 NOTE — Telephone Encounter (Signed)
I agree Eliquis and amiodarone today, continue hydration and restart carvedilol tomorrow

## 2019-03-20 NOTE — Telephone Encounter (Signed)
Dr. Meda Coffee this pt is calling back to see if her BP and HR is appropriate to continue her current med regimen, hold any medicine, or change all together.  Please advise.

## 2019-03-20 NOTE — Telephone Encounter (Signed)
Frederik Schmidt, RN       03/19/19 3:49 PM  Note    I spoke to the patient who is concerned because her BP has been low and she's felt tired.  Her BP at 9:20 this morning was 96/57 with HR of 52.  She hydrated herself and ate breakfast.    At 12:15 pm, her BP was 129/77 with HR of 67.  She stated that last night 5/31, she urinated 6 times between 11:30 pm-8:30 am.  We discussed that this may of played a role.  She will check BP tonight and tomorrow morning and report to [place].

## 2019-03-20 NOTE — Telephone Encounter (Signed)
Dr. Meda Coffee, pt held her coreg, amiodarone, entresto, and eliquis today, for yesterday she was advised to do so, and hydrate well and call back with her new readings today.  Pt is asymptomatic today and her BP is 106/69 and HR 66.  Yesterday she was hypotensive.  Advised the pt that while waiting on a response from either you or [place], she should continue taking her Eliquis and amiodarone, and she can hold her coreg tonight, continue to hydrate well, and we will follow-up with her either today or tomorrow at latest.  Pt states she is doing well, but she  needs direction on her medication regimen, to prevent further hypotension.   Informed the pt that as soon as I get a response, either myself, or Demaris Callander will return her call back shortly thereafter.  Pt verbalized understanding and agrees with this plan.

## 2019-03-20 NOTE — Telephone Encounter (Signed)
New Message      Pt is calling to leave her BP reading from this morning   BP 106/69 hr 66

## 2019-03-20 NOTE — Telephone Encounter (Signed)
New Message  Patient calling back to see whether or not to take her medication advised to hold off on medication and you will call her back per triage.

## 2019-03-20 NOTE — Telephone Encounter (Signed)
Follow UP;    Pt calling to find out what to do about her medicine.

## 2019-03-23 ENCOUNTER — Other Ambulatory Visit: Payer: No Typology Code available for payment source

## 2019-03-23 ENCOUNTER — Ambulatory Visit: Payer: No Typology Code available for payment source | Admitting: Hematology and Oncology

## 2019-03-27 ENCOUNTER — Telehealth: Payer: Self-pay | Admitting: Cardiology

## 2019-03-27 NOTE — Telephone Encounter (Signed)
Call returned to Pt.  Pt has continued to have systolic blood pressures in the 90's and 100's on her current medication regimen.  She has headaches and feels tired often.  She spends a good part of her day trying to increase her blood pressure with fluids and food.  Advised Pt that she should discuss her ongoing symptomatic hypotension with APP to see if any changes could be made to improve her quality of life.    Pt agrees.  Pt agrees to appt on Thursday at 10:00 am.  Pt aware appt is virtual.  Pt states APP face timed her for her last visit.  Pt blood pressure stable at this time-reports systolic 962 after many glasses of water and pedialyte.

## 2019-03-27 NOTE — Telephone Encounter (Signed)
  Pt c/o BP issue: STAT if pt c/o blurred vision, one-sided weakness or slurred speech  1. What are your last 5 BP readings?   96/67 p 70 102/62 p 68   2. Are you having any other symptoms (ex. Dizziness, headache, blurred vision, passed out)? headache  3. What is your BP issue? Patient is concerned because her BP is running low and her head hurts. She states that it has been fine until this morning.

## 2019-03-29 ENCOUNTER — Other Ambulatory Visit: Payer: Self-pay

## 2019-03-29 ENCOUNTER — Telehealth (INDEPENDENT_AMBULATORY_CARE_PROVIDER_SITE_OTHER): Payer: No Typology Code available for payment source | Admitting: Cardiology

## 2019-03-29 ENCOUNTER — Telehealth: Payer: Self-pay | Admitting: Cardiology

## 2019-03-29 ENCOUNTER — Telehealth: Payer: Self-pay

## 2019-03-29 ENCOUNTER — Encounter: Payer: Self-pay | Admitting: Cardiology

## 2019-03-29 VITALS — BP 139/78 | HR 68 | Ht 61.0 in | Wt 137.2 lb

## 2019-03-29 DIAGNOSIS — I5022 Chronic systolic (congestive) heart failure: Secondary | ICD-10-CM

## 2019-03-29 DIAGNOSIS — I959 Hypotension, unspecified: Secondary | ICD-10-CM

## 2019-03-29 MED ORDER — METOPROLOL SUCCINATE ER 25 MG PO TB24: 12.5000 mg | ORAL_TABLET | Freq: Every day | ORAL | 3 refills | Status: DC

## 2019-03-29 NOTE — Telephone Encounter (Signed)
New  Message     Pt is calling because she has a dentist appt at 28 and she has to take amoxicillin and she wants to make sure that's okay

## 2019-03-29 NOTE — Telephone Encounter (Signed)
Lpm that prescription is ready for pick up at [facility] office.

## 2019-03-29 NOTE — Addendum Note (Signed)
Addended by: Frederik Schmidt on: 03/29/2019 10:30 AM   Modules accepted: Orders

## 2019-03-29 NOTE — Telephone Encounter (Signed)
I spoke to the patient and informed her to take Amoxicillin before going to the dentist.

## 2019-03-29 NOTE — Addendum Note (Signed)
Addended by: Dollene Primrose on: 03/29/2019 11:11 AM   Modules accepted: Orders

## 2019-03-29 NOTE — Progress Notes (Signed)
Virtual Visit via Video Note   This visit type was conducted due to national recommendations for restrictions regarding the COVID-19 Pandemic (e.g. social distancing) in an effort to limit this patient's exposure and mitigate transmission in our community.  Due to her co-morbid illnesses, this patient is at least at moderate risk for complications without adequate follow up.  This format is felt to be most appropriate for this patient at this time.  All issues noted in this document were discussed and addressed.  A limited physical exam was performed with this format.  Please refer to the patient's chart for her consent to telehealth for [facility].   Date:  03/29/2019   ID:  [name], DOB May 08, 1941, MRN [id number]  Patient Location: Home Provider Location: Home  PCP:  Reubin Milan, MD  Cardiologist:  Ena Dawley, MD  Electrophysiologist:  None   Evaluation Performed:  Follow-Up Visit  Chief Complaint:  Hypotension   History of Present Illness:    Carrie [name] is a 78 y.o. female being evaluated for hypotension and systolic heart failure. She has a PMH of HTN, PAF (s/p recent cardioversion 4/0/1027), follicular lymphoma s/p chemo (last tx 01/25/2019), hypothyroidism, CKD stage 3 and recent diagnosis of systolic HF. Of note, she had a NST in 2017 that was negative for ischemia and echo showed normal EF at that time.   On Feb 16, 2019, she presented to the ED w/ dyspnea and fatigue and was found to be in rapid atrial fibrillation and she required DCCV and was placed on amiodarone. Echo was done and showed significantly reduced LVEF, down to 20-25% (previously normal). This was felt either tachy mediated from rapid afib vs chemo induced. No CP and no plans for ischemic w/u. She did require diuretics for volume overload. Was initially treated w/ IV Lasix then transitioned to PO. She was also placed on guidelines directed medical therapy w/ Coreg and Entresto.   She was  readmitted again 5/17 for near syncope and was found to be orthostatic. Her Coreg and Delene Loll were initially held. Lasix discontinued. Pt monitored. Coreg and Entresto added back and BP normalized and symptoms resolved. Pt was seen during hospitalization by Dr. Angelena Form who recommended that she continue to refrain from Lasix. Pt was discharged home in stable condition.   I had evaluated her for post hospital follow-up and she was doing fairly decent, at that time, however she was added back to my schedule today due to issues with recurrent hypotension.  She cannot tolerate systolic pressures less than 115.  She feels extremely tired and fatigued.  She denies any associated dizziness, lightheadedness and no recurrent near syncope or syncope.  She felt bad several days earlier this week and recorded blood pressures of 109/68, 110/79 and 108/74.  Her heart rates have been well controlled in the 60s.  Current cardiac medications include Entresto 24-26 mg, carvedilol 3.125 mg twice daily and amlodipine 200 mg daily.  She is also on Eliquis for anticoagulation, 5 mg twice daily.  Otherwise, she has been doing well from a heart failure standpoint.  She denies any dyspnea, orthopnea, PND or lower extremity edema.  She also denies chest pain and no palpitations/symptoms of breakthrough atrial fibrillation.  Her blood pressure this morning is 139/70 however this is prior to taking her Entresto and Coreg. No abnormal bleeding or falls w/ Eliquis.   The patient does not have symptoms concerning for COVID-19 infection (fever, chills, cough, or new shortness of breath).  Past Medical History:  Diagnosis Date  . A-fib ([place])   . Anemia    "years ago"  . Atrial fibrillation with rapid ventricular response ([place]) 06/25/2015  . Carpal tunnel syndrome, bilateral   . Cervical spondylosis without myelopathy 10/25/2013  . Dental bridge present   . DJD (degenerative joint disease)   . Dysrhythmia    WENT INTO A FIB IN  2017   . Elevated troponin 02/18/2015  . Follicular lymphoma grade 3a ([place]) 02/03/2015  . History of echocardiogram    Echo 12/16: EF 60-65%, no RWMA, severe LAE  . History of nuclear stress test    Myoview 1/17: EF 55%, Normal study. No ischemia or scar.  . Hypothyroid   . Nausea without vomiting 06/20/2015  . Pneumonia   . Stage III chronic kidney disease ([place]) 07/01/2015  . Thrush of mouth and esophagus ([place]) 06/25/2015   Past Surgical History:  Procedure Laterality Date  . ABDOMINAL HYSTERECTOMY    . APPENDECTOMY    . BACK SURGERY     X5-lumbar-fusion  . COLONOSCOPY    . DILATION AND CURETTAGE OF UTERUS    . LYMPH NODE BIOPSY Right 01/21/2015   Procedure: RIGHT GROIN LYMPH NODE BIOPSY;  Surgeon: Erroll Luna, MD;  Location: [place];  Service: General;  Laterality: Right;  . MASS EXCISION Left 08/29/2018   Procedure: EXCISION LEFT BACK  MASS;  Surgeon: Erroll Luna, MD;  Location: [place];  Service: General;  Laterality: Left;  . Ovarian cyst resection    . patelar tendon transplants     Left/right  . PORTACATH PLACEMENT Right 02/13/2015   Procedure: INSERTION PORT-A-CATH WITH ULTRASOUND;  Surgeon: Erroll Luna, MD;  Location: [place];  Service: General;  Laterality: Right;  . PORTACATH PLACEMENT N/A 08/29/2018   Procedure: INSERTION PORT-A-CATH WITH ULTRA SOUND ERAS PATHWAY;  Surgeon: Erroll Luna, MD;  Location: [place];  Service: General;  Laterality: N/A;  . TOTAL KNEE ARTHROPLASTY  2011   Right  . TUBAL LIGATION       No outpatient medications have been marked as taking for the 03/29/19 encounter (Appointment) with Consuelo Pandy, PA-C.     Allergies:   Patient has no known allergies.   Social History   Tobacco Use  . Smoking status: Never Smoker  . Smokeless tobacco: Never Used  Substance Use Topics  . Alcohol use: Yes    Comment: occassional wine  . Drug use: No     Family Hx: The patient's family history includes Cancer in her mother  and sister.  ROS:   Please see the history of present illness.     All other systems reviewed and are negative.   Prior CV studies:   The following studies were reviewed today:  Echo 02/25/19 IMPRESSIONS   1. The left ventricle has severely reduced systolic function, with an ejection fraction of 20-25%. The cavity size was normal. Left ventricular diastolic function could not be evaluated secondary to atrial fibrillation. Left ventricular diffuse  hypokinesis. 2. The right ventricle has severely reduced systolic function. The cavity was mildly enlarged. There is no increase in right ventricular wall thickness. Right ventricular systolic pressure is mildly elevated with an estimated pressure of 23.5 mmHg. 3. Left atrial size was mildly dilated. 4. Right atrial size was mildly dilated. 5. Trivial pericardial effusion is present. 6. The mitral valve is grossly normal. Mild thickening of the mitral valve leaflet. Mitral valve regurgitation is moderate by color flow Doppler. 7. The aortic  valve is tricuspid. Aortic valve regurgitation is trivial by color flow Doppler. No stenosis of the aortic valve.  SUMMARY  When compared to the prior study from 2016 there is a significant change, LVEF has decreased from 50-55% to 15-20% with diffuse hypokinesis. RVEF is severely decreased.  Labs/Other Tests and Data Reviewed:    EKG:  An ECG dated 02/2019 was personally reviewed today and demonstrated:  sinus brady 59 bpm, inferior TW inversions  Recent Labs: 03/04/2019: ALT 14; B Natriuretic Peptide 207.3 03/05/2019: BUN 31; Creatinine, Ser 1.38; Hemoglobin 13.1; Magnesium 2.1; Platelets 174; Potassium 3.9; Sodium 138; TSH 1.333   Recent Lipid Panel No results found for: CHOL, TRIG, HDL, CHOLHDL, LDLCALC, LDLDIRECT  Wt Readings from Last 3 Encounters:  03/14/19 136 lb 9.6 oz (62 kg)  03/04/19 137 lb 9.1 oz (62.4 kg)  02/28/19 135 lb 11.2 oz (61.6 kg)     Objective:    Vital  Signs:  There were no vitals taken for this visit.   VITAL SIGNS:  reviewed GEN:  no acute distress EYES:  sclerae anicteric, EOMI - Extraocular Movements Intact RESPIRATORY:  normal respiratory effort, symmetric expansion CARDIOVASCULAR:  no peripheral edema SKIN:  no rash, lesions or ulcers. MUSCULOSKELETAL:  no obvious deformities. NEURO:  alert and oriented x 3, no obvious focal deficit PSYCH:  normal affect  ASSESSMENT & PLAN:    1. Systolic heart failure: New diagnosis.  Recent echocardiogram showed reduced EF down to 20 to 25% (previously normal).  Etiology felt to be likely nonischemic, tachycardia mediated secondary to rapid atrial fibrillation versus chemotherapy induced.  She has not had any recent anginal symptomatology and there are no plans for ischemic work-up at this time.  She denies any current dyspnea.  No orthopnea, PND or lower extremity edema and no weight gain.  Continue guidelines directed medical therapy with Entresto.  Given her low blood pressure and symptoms, we will stop carvedilol and will try low-dose Toprol XL, 12.5 mg daily. Lasix previously discontinued due to orthostatic hypotension.  Per patient, no signs and symptoms of acute CHF.  Her volume has remained stable.  No dyspnea, orthopnea, PND or lower extremity edema.  2.  Atrial fibrillation: Status post direct-current cardioversion Feb 16, 2019.  Placed on amiodarone.  She denies any symptoms of breakthrough atrial fibrillation.  Continue amiodarone 200 mg daily.  Her heart rate has been well controlled with beta-blocker therapy.  As noted above, we will stop Coreg due to low blood pressure and add low-dose Toprol XL. On Eliquis for anticoagulation.  She denies any abnormal bleeding and no falls or balance issues.  3.    History of near syncope secondary to orthostatic hypotension: Previous hospitalization several weeks ago for near syncope/orthostatic hypotension, felt secondary to overdiuresis.  Patient was  previously on Lasix as well as Entresto.  She remains on Entresto but Lasix was discontinued.    She has had no recurrent near syncope or syncope but reports symptoms with lower blood pressures.  She cannot tolerate systolic blood pressures less than 115.  She feels very tired and fatigue with such blood pressures.  Her blood pressure this morning is 139/70 prior to taking her Entresto and carvedilol.  We will change her beta-blocker.  We will stop carvedilol which has alpha 1 blocker properties and will try low-dose Toprol-XL, 12.5 mg daily.  I will have RN follow-up with a phone call in 1 week to see if her pressures have improved and symptoms have resolved.  If  she is unable to tolerate low-dose metoprolol, then we will permanently discontinue beta-blocker and continue monotherapy with Entresto.  4. Follicular lymphoma: s/p chemo (last tx 01/25/2019)   COVID-19 Education: The signs and symptoms of COVID-19 were discussed with the patient and how to seek care for testing (follow up with PCP or arrange E-visit).  The importance of social distancing was discussed today.  Time:   Today, I have spent 10 minutes with the patient with telehealth technology discussing the above problems.     Medication Adjustments/Labs and Tests Ordered: Current medicines are reviewed at length with the patient today.  Concerns regarding medicines are outlined above.   Tests Ordered: No orders of the defined types were placed in this encounter.   Medication Changes: No orders of the defined types were placed in this encounter.   Disposition:  Follow up 1 week RN visit to reassess BP.   Signed, Lyda Jester, PA-C  03/29/2019 9:42 AM    [place]

## 2019-03-29 NOTE — Patient Instructions (Signed)
Medication Instructions:  Stop Coreg (Carvedilol) Start Metoprolol Succinate (Toprol) XL 12.5 mg DAILY If you need a refill on your cardiac medications before your next appointment, please call your pharmacy.   Lab work: NONE If you have labs (blood work) drawn today and your tests are completely normal, you will receive your results only by: Marland Kitchen MyChart Message (if you have MyChart) OR . A paper copy in the mail If you have any lab test that is abnormal or we need to change your treatment, we will call you to review the results.  Testing/Procedures: NONE  Follow-Up: 1 WEEK we will call you to check BP At [facility], you and your health needs are our priority.  As part of our continuing mission to provide you with exceptional heart care, we have created designated Provider Care Teams.  These Care Teams include your primary Cardiologist (physician) and Advanced Practice Providers (APPs -  Physician Assistants and Nurse Practitioners) who all work together to provide you with the care you need, when you need it. .   Any Other Special Instructions Will Be Listed Below (If Applicable).

## 2019-04-02 ENCOUNTER — Telehealth: Payer: Self-pay | Admitting: Cardiology

## 2019-04-02 MED ORDER — SACUBITRIL-VALSARTAN 24-26 MG PO TABS: 1.0000 | ORAL_TABLET | Freq: Two times a day (BID) | ORAL | 3 refills | Status: DC

## 2019-04-02 NOTE — Telephone Encounter (Signed)
Tye Maryland, can you please print off the prescription for this pts Entresto under Dr. Meda Coffee and use her stamp and fax this to the [facility] attn:Pharmacy pt needs STAT at  [phone].  Dr. Francesca Oman stamp is in her office in my cart that has my name on it, first drawer. Thanks!   I spoke with the pt and she would like it sent this way, which we have done in the past, to the [place] [facility].  That way she gets her meds at no cost.

## 2019-04-02 NOTE — Telephone Encounter (Signed)
Pt's medication Script was faxed to [facility] [phone], confirmation received.

## 2019-04-02 NOTE — Telephone Encounter (Signed)
Pt calling requesting a refill on entresto 24-26 mg tablet, pt would like to pick up the prescription to take to the [place]. I explained to the pt that Dr. Meda Coffee is not in the office to be able to sign the Rx. Pt would like a call back concerning this matter. Please address

## 2019-04-23 ENCOUNTER — Telehealth: Payer: Self-pay | Admitting: Cardiology

## 2019-04-23 NOTE — Telephone Encounter (Signed)
I spoke with pt. She reports she is feeling very tired today.  No other complaints. Heart rate was 52 this AM.  At noon heart rate was 69 and BP 120/68.  On 7/5 readings were 110/68,55 in the morning.  On 7/4 readings were 134/70,60 in the AM.  She is afraid because of heart rate and feeling tired she may go back into atrial fib.  She is not having any palpitations or feeling or irregular heart beat.  Taking medications as listed.  I told pt readings looked OK.  I asked her to let us know if she continues to feel tired or has any other change in symptoms.  She has video visit on July 14,2020 with Dr Meda Coffee

## 2019-04-23 NOTE — Telephone Encounter (Signed)
New Message            Patient is calling about her pulse being low,69 @ noon 52 @8 :15 55 on yesterday on 07/04/ 60, patient states he is trying to go into A-Fib.After trying to get a nurse I told the patient I would send the message. Pls advise.

## 2019-04-24 NOTE — Telephone Encounter (Signed)
Please switch her video visit on 7/14 to in real life visit so we can obtain ECG

## 2019-04-24 NOTE — Telephone Encounter (Signed)
Thank you :)

## 2019-04-24 NOTE — Telephone Encounter (Signed)
    COVID-19 Pre-Screening Questions:  . In the past 7 to 10 days have you had a cough,  shortness of breath, headache, congestion, fever (100 or greater) body aches, chills, sore throat, or sudden loss of taste or sense of smell?-NO . Have you been around anyone with known Covid 19.-NO . Have you been around anyone who is awaiting Covid 19 test results in the past 7 to 10 days?-NO . Have you been around anyone who has been exposed to Covid 19, or has mentioned symptoms of Covid 19 within the past 7 to 10 days?-NO   Spoke with the pt and endorsed to her that Dr Meda Coffee would like to turn her virtual video appt for next week into a regular OV.  Pt states she wants to be seen tomorrow if possible, for a OV, for she is still having issues with feeling really tired, and soreness under her left breast. Pt had no further cardiac complaints at this time.  Endorsed to the pt that Dr Meda Coffee is not in the office tomorrow, but I can schedule her with an APP.  Pt was completely ok with this.  Scheduled the pt to see [name] PA-C for tomorrow 7/8 at 2:15 pm.  Pt is aware to wear her facial mask during the entire duration of her OV.  Pt is aware that no visitors are allowed.  Informed the pt that I will route this message to Dr Meda Coffee as a general FYI, to make her aware that she prefers being seen tomorrow.  Will cancel her 7/14 video visit with Dr. Meda Coffee.  Pt verbalized understanding and agrees with this plan. Pt more than gracious for all the assistance provided.

## 2019-04-25 ENCOUNTER — Encounter: Payer: Self-pay | Admitting: Physician Assistant

## 2019-04-25 ENCOUNTER — Other Ambulatory Visit: Payer: Self-pay

## 2019-04-25 ENCOUNTER — Ambulatory Visit (INDEPENDENT_AMBULATORY_CARE_PROVIDER_SITE_OTHER): Payer: No Typology Code available for payment source | Admitting: Medical

## 2019-04-25 VITALS — BP 122/70 | HR 57 | Ht 61.0 in | Wt 138.0 lb

## 2019-04-25 DIAGNOSIS — I480 Paroxysmal atrial fibrillation: Secondary | ICD-10-CM

## 2019-04-25 DIAGNOSIS — R55 Syncope and collapse: Secondary | ICD-10-CM | POA: Diagnosis not present

## 2019-04-25 DIAGNOSIS — I5022 Chronic systolic (congestive) heart failure: Secondary | ICD-10-CM

## 2019-04-25 DIAGNOSIS — I10 Essential (primary) hypertension: Secondary | ICD-10-CM | POA: Diagnosis not present

## 2019-04-25 NOTE — Progress Notes (Signed)
Cardiology Office Note   Date:  04/25/2019   ID:  Carrie [name], Carrie [name] 1940/12/18, MRN [id number]  PCP:  Reubin Milan, MD  Cardiologist:  Ena Dawley, MD EP: None  Chief Complaint  Patient presents with  . Fatigue  . Chest Pain      History of Present Illness: Carrie [name] is a 78 y.o. female with PMH of chronic combined CHF, HTN, paroxsymal atrial fibrillation s/p DCCV 10/26/6220, follicular lymphoma s/p chemo (last tx 01/25/2019), hypothyroidism, and CKD stage 3, who presents for the evaluation of fatigue and soreness under left breast.   She was last evaluated by cardiology via a telemedicine visit with Lyda Jester, PA-C 03/29/2019, at which time she had complaints of hypotension and fatigue but was without anginal or volume overload complaints. She was transitioned from carvedilol to metoprolol succinate at that time in an effort to limit hypotension. Her last ischemic evaluation was a NST in 2017 which was without ischemia or scarring. Echo 02/2019 showed EF 20-25%, diffuse LV hypokinesis, severely decreased RVEF, mildly elevated RV systolic pressures, mildly biatrial enlargement, moderate MR. 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   She returns to clinic today with complaints of ongoing fatigue, though she reports this has improved over the past 3 weeks. On Sunday she experienced some soreness under her left breast which persisted throughout the night and resolved spontaneously Monday morning. No associated symptoms. She reported it felt like a bruise and was tender to touch. Overall she feels like she is doing okay from a cardiac standpoint. No complaints of dizziness, lightheadedness, palpitations, orthopnea, PND, LE edema, syncope, or weight gain. She reports compliance with a low sodium diet.    Past Medical History:  Diagnosis Date  . A-fib ([place])   . Anemia    "years ago"  . Atrial fibrillation with rapid ventricular response ([place]) 06/25/2015  .  Carpal tunnel syndrome, bilateral   . Cervical spondylosis without myelopathy 10/25/2013  . Dental bridge present   . DJD (degenerative joint disease)   . Dysrhythmia    WENT INTO A FIB IN 2017   . Elevated troponin 02/18/2015  . Follicular lymphoma grade 3a ([place]) 02/03/2015  . History of echocardiogram    Echo 12/16: EF 60-65%, no RWMA, severe LAE  . History of nuclear stress test    Myoview 1/17: EF 55%, Normal study. No ischemia or scar.  . Hypothyroid   . Nausea without vomiting 06/20/2015  . Pneumonia   . Stage III chronic kidney disease ([place]) 07/01/2015  . Thrush of mouth and esophagus ([place]) 06/25/2015    Past Surgical History:  Procedure Laterality Date  . ABDOMINAL HYSTERECTOMY    . APPENDECTOMY    . BACK SURGERY     X5-lumbar-fusion  . COLONOSCOPY    . DILATION AND CURETTAGE OF UTERUS    . LYMPH NODE BIOPSY Right 01/21/2015   Procedure: RIGHT GROIN LYMPH NODE BIOPSY;  Surgeon: Erroll Luna, MD;  Location: [facility];  Service: General;  Laterality: Right;  . MASS EXCISION Left 08/29/2018   Procedure: EXCISION LEFT BACK  MASS;  Surgeon: Erroll Luna, MD;  Location: [facility];  Service: General;  Laterality: Left;  . Ovarian cyst resection    . patelar tendon transplants     Left/right  . PORTACATH PLACEMENT Right 02/13/2015   Procedure: INSERTION PORT-A-CATH WITH ULTRASOUND;  Surgeon: Erroll Luna, MD;  Location: [facility];  Service: General;  Laterality: Right;  . PORTACATH PLACEMENT N/A  08/29/2018   Procedure: INSERTION PORT-A-CATH WITH ULTRA SOUND ERAS PATHWAY;  Surgeon: Erroll Luna, MD;  Location: [facility];  Service: General;  Laterality: N/A;  . TOTAL KNEE ARTHROPLASTY  2011   Right  . TUBAL LIGATION       Current Outpatient Medications  Medication Sig Dispense Refill  . acetaminophen (TYLENOL) 500 MG tablet Take 500 mg by mouth every 6 (six) hours as needed for moderate pain or headache.    Marland Kitchen amiodarone (PACERONE) 200 MG tablet Take 1 tablet (200 mg  total) by mouth daily. 90 tablet 3  . apixaban (ELIQUIS) 5 MG TABS tablet Take 5 mg by mouth 2 (two) times daily.     . bisacodyl (DULCOLAX) 5 MG EC tablet Take 5 mg by mouth daily as needed for moderate constipation.    . diclofenac sodium (VOLTAREN) 1 % GEL Apply 2 g topically 3 (three) times daily as needed (knee pain). 100 g 0  . levothyroxine (SYNTHROID, LEVOTHROID) 112 MCG tablet Take 112 mcg by mouth daily.   9  . lidocaine-prilocaine (EMLA) cream Apply to affected area once 30 g 3  . metoprolol succinate (TOPROL-XL) 25 MG 24 hr tablet Take 0.5 tablets (12.5 mg total) by mouth daily. Take with or immediately following a meal. 45 tablet 3  . polyvinyl alcohol (LIQUIFILM TEARS) 1.4 % ophthalmic solution Place 1 drop into both eyes as needed for dry eyes.    Marland Kitchen prochlorperazine (COMPAZINE) 10 MG tablet Take 1 tablet (10 mg total) by mouth every 6 (six) hours as needed (Nausea or vomiting). 30 tablet 1  . sacubitril-valsartan (ENTRESTO) 24-26 MG Take 1 tablet by mouth 2 (two) times daily. 180 tablet 3   No current facility-administered medications for this visit.    Facility-Administered Medications Ordered in Other Visits  Medication Dose Route Frequency Provider Last Rate Last Dose  . ondansetron (ZOFRAN) 8 mg in sodium chloride 0.9 % 50 mL IVPB   Intravenous Once Gorsuch, Ni, MD      . sodium chloride 0.9 % injection 10 mL  10 mL Intravenous PRN Alvy Bimler, Ni, MD   10 mL at 04/05/17 1315    Allergies:   Patient has no known allergies.    Social History:  The patient  reports that she has never smoked. She has never used smokeless tobacco. She reports current alcohol use. She reports that she does not use drugs.   Family History:  The patient's family history includes Cancer in her mother and sister.    ROS:  Please see the history of present illness.   Otherwise, review of systems are positive for none.   All other systems are reviewed and negative.    PHYSICAL EXAM: VS:  BP 122/70    Pulse (!) 57   Ht 5\' 1"  (1.549 m)   Wt 138 lb (62.6 kg)   BMI 26.07 kg/m  , BMI Body mass index is 26.07 kg/m. GEN: Well nourished, well developed, in no acute distress HEENT: sclera anicteric Neck: no JVD, carotid bruits, or masses Cardiac: RRR; +murmur, no rubs or gallops,no edema  Respiratory:  clear to auscultation bilaterally, normal work of breathing GI: soft, nontender, nondistended, + BS MS: no deformity or atrophy Skin: warm and dry, no rash Neuro:  Strength and sensation are intact Psych: euthymic mood, full affect   EKG:  EKG is ordered today. The ekg ordered today demonstrates sinus bradycardia, rate 57 bpm, no STE/D, non-specific T wave abnormalities, no significant change from previous.  Recent Labs: 03/04/2019: ALT 14; B Natriuretic Peptide 207.3 03/05/2019: BUN 31; Creatinine, Ser 1.38; Hemoglobin 13.1; Magnesium 2.1; Platelets 174; Potassium 3.9; Sodium 138; TSH 1.333    Lipid Panel No results found for: CHOL, TRIG, HDL, CHOLHDL, VLDL, LDLCALC, LDLDIRECT    Wt Readings from Last 3 Encounters:  04/25/19 138 lb (62.6 kg)  03/29/19 137 lb 3.2 oz (62.2 kg)  03/14/19 136 lb 9.6 oz (62 kg)      Other studies Reviewed: Additional studies/ records that were reviewed today include:   Echocardiogram 02/2019: IMPRESSIONS    1. The left ventricle has severely reduced systolic function, with an ejection fraction of 20-25%. The cavity size was normal. Left ventricular diastolic function could not be evaluated secondary to atrial fibrillation. Left ventricular diffuse  hypokinesis.  2. The right ventricle has severely reduced systolic function. The cavity was mildly enlarged. There is no increase in right ventricular wall thickness. Right ventricular systolic pressure is mildly elevated with an estimated pressure of 23.5 mmHg.  3. Left atrial size was mildly dilated.  4. Right atrial size was mildly dilated.  5. Trivial pericardial effusion is present.  6. The  mitral valve is grossly normal. Mild thickening of the mitral valve leaflet. Mitral valve regurgitation is moderate by color flow Doppler.  7. The aortic valve is tricuspid. Aortic valve regurgitation is trivial by color flow Doppler. No stenosis of the aortic valve.  SUMMARY   When compared to the prior study from 2016 there is a significant change, LVEF has decreased from 50-55% to 15-20% with diffuse hypokinesis. RVEF is severely decreased.  NST 2017:   Nuclear stress EF: 55%.  There was no ST segment deviation noted during stress.  The study is normal.  This is a low risk study.  The left ventricular ejection fraction is normal (55-65%).   Normal study. No ischemia or scar.   ASSESSMENT AND PLAN:  1. Chronic combined CHF: found to have an EF of 20-25% on Echo 02/2019. Felt to be chemotherapy induced cardiomyopathy. She was started on entresto and carvedilol, later transitioned to entresto and metoprolol succinate. She is without volume overload complaints and appears euvolemic on exam today. Suspect fatigue may be related to both systolic CHF and BBlocker thearpy. We had a long discussion on the nature of systolic CHF and expectations going forward and the importance of staying on entresto and a BBlocker - all questions were answered.  - Continue entresto and metoprolol succinate - Will plan to repeat an echocardiogram next month to evaluate for improvement in EF. If no improvement, will refer to EP for ICD consideration for primary prevention - Encouraged her to continue to monitor daily weights and limit salt intake to <2g per day.   2. Paroxysmal atrial fibrillation: s/p DCCV 02/16/2019. EKG today with sinus rhythm. She denies palpitations or jump in blood pressure which has signaled recurrent atrial fibrillation in the past. No complaints of bleeding on eliquis. - Continue metoprolol succinate for rate control - Continue eliquis for stroke ppx  3. HTN in patient with history  of pre-syncope/orthostatic hypotension: no recent complaints of dizziness/lightheadedness. Seems to be tolerating low dose metoprolol succinate.  - Continue to monitor for recurrent symptoms  4. Follicular lymphoma: s/p chemotherapy with last treatment 01/2019 - Continue routine follow-up with oncology. Recommended to avoid rituximab in the future given cardiomyopathy.    Current medicines are reviewed at length with the patient today.  The patient does not have concerns regarding medicines.  The following  changes have been made:  no change  Labs/ tests ordered today include:   Orders Placed This Encounter  Procedures  . EKG 12-Lead  . ECHOCARDIOGRAM COMPLETE     Disposition:   FU with Dr. Marjo Bicker in 1 month  Signed, Abigail Butts, PA-C  04/25/2019 3:10 PM

## 2019-04-25 NOTE — Patient Instructions (Signed)
Medication Instructions:   Your physician recommends that you continue on your current medications as directed. Please refer to the Current Medication list given to you today.  If you need a refill on your cardiac medications before your next appointment, please call your pharmacy.   Lab work:  NONE ORDERED  TODAY   If you have labs (blood work) drawn today and your tests are completely normal, you will receive your results only by: Marland Kitchen MyChart Message (if you have MyChart) OR . A paper copy in the mail If you have any lab test that is abnormal or we need to change your treatment, we will call you to review the results.  Testing/Procedures: IN ONE MONTH Your physician has requested that you have an echocardiogram. Echocardiography is a painless test that uses sound waves to create images of your heart. It provides your doctor with information about the size and shape of your heart and how well your heart's chambers and valves are working. This procedure takes approximately one hour. There are no restrictions for this procedure.  Follow-Up: At [facility], you and your health needs are our priority.  As part of our continuing mission to provide you with exceptional heart care, we have created designated Provider Care Teams.  These Care Teams include your primary Cardiologist (physician) and Advanced Practice Providers (APPs -  Physician Assistants and Nurse Practitioners) who all work together to provide you with the care you need, when you need it. You will need a follow up appointment in 1 months. AFTER ECHO  Please call our office 2 months in advance to schedule this appointment.  You may see Ena Dawley, MD or one of the following Advanced Practice Providers on your designated Care Team:   [place], PA-C Melina Copa, PA-C . Ermalinda Barrios, PA-C  Any Other Special Instructions Will Be Listed Below (If Applicable).

## 2019-04-27 ENCOUNTER — Inpatient Hospital Stay: Payer: Medicare HMO

## 2019-04-27 ENCOUNTER — Other Ambulatory Visit: Payer: Medicare HMO

## 2019-04-27 ENCOUNTER — Ambulatory Visit: Payer: Medicare HMO | Admitting: Hematology and Oncology

## 2019-04-30 ENCOUNTER — Inpatient Hospital Stay: Payer: No Typology Code available for payment source | Attending: Hematology and Oncology

## 2019-04-30 ENCOUNTER — Inpatient Hospital Stay (HOSPITAL_BASED_OUTPATIENT_CLINIC_OR_DEPARTMENT_OTHER): Payer: No Typology Code available for payment source | Admitting: Hematology and Oncology

## 2019-04-30 ENCOUNTER — Inpatient Hospital Stay: Payer: No Typology Code available for payment source

## 2019-04-30 ENCOUNTER — Encounter: Payer: Self-pay | Admitting: Hematology and Oncology

## 2019-04-30 ENCOUNTER — Other Ambulatory Visit: Payer: Self-pay

## 2019-04-30 DIAGNOSIS — C8238 Follicular lymphoma grade IIIa, lymph nodes of multiple sites: Secondary | ICD-10-CM | POA: Diagnosis present

## 2019-04-30 DIAGNOSIS — Z452 Encounter for adjustment and management of vascular access device: Secondary | ICD-10-CM | POA: Insufficient documentation

## 2019-04-30 DIAGNOSIS — Z9221 Personal history of antineoplastic chemotherapy: Secondary | ICD-10-CM

## 2019-04-30 DIAGNOSIS — Z79899 Other long term (current) drug therapy: Secondary | ICD-10-CM

## 2019-04-30 DIAGNOSIS — N183 Chronic kidney disease, stage 3 unspecified: Secondary | ICD-10-CM

## 2019-04-30 DIAGNOSIS — I5021 Acute systolic (congestive) heart failure: Secondary | ICD-10-CM

## 2019-04-30 DIAGNOSIS — Z7901 Long term (current) use of anticoagulants: Secondary | ICD-10-CM | POA: Insufficient documentation

## 2019-04-30 LAB — CBC WITH DIFFERENTIAL (CANCER CENTER ONLY)
Abs Immature Granulocytes: 0.03 10*3/uL (ref 0.00–0.07)
Basophils Absolute: 0 10*3/uL (ref 0.0–0.1)
Basophils Relative: 1 %
Eosinophils Absolute: 0.3 10*3/uL (ref 0.0–0.5)
Eosinophils Relative: 5 %
HCT: 39.5 % (ref 36.0–46.0)
Hemoglobin: 12.9 g/dL (ref 12.0–15.0)
Immature Granulocytes: 1 %
Lymphocytes Relative: 10 %
Lymphs Abs: 0.6 10*3/uL — ABNORMAL LOW (ref 0.7–4.0)
MCH: 31 pg (ref 26.0–34.0)
MCHC: 32.7 g/dL (ref 30.0–36.0)
MCV: 95 fL (ref 80.0–100.0)
Monocytes Absolute: 0.9 10*3/uL (ref 0.1–1.0)
Monocytes Relative: 14 %
Neutro Abs: 4.4 10*3/uL (ref 1.7–7.7)
Neutrophils Relative %: 69 %
Platelet Count: 203 10*3/uL (ref 150–400)
RBC: 4.16 MIL/uL (ref 3.87–5.11)
RDW: 13.2 % (ref 11.5–15.5)
WBC Count: 6.3 10*3/uL (ref 4.0–10.5)
nRBC: 0 % (ref 0.0–0.2)

## 2019-04-30 LAB — CMP (CANCER CENTER ONLY)
ALT: 11 U/L (ref 0–44)
AST: 16 U/L (ref 15–41)
Albumin: 4.2 g/dL (ref 3.5–5.0)
Alkaline Phosphatase: 81 U/L (ref 38–126)
Anion gap: 9 (ref 5–15)
BUN: 35 mg/dL — ABNORMAL HIGH (ref 8–23)
CO2: 24 mmol/L (ref 22–32)
Calcium: 8.8 mg/dL — ABNORMAL LOW (ref 8.9–10.3)
Chloride: 108 mmol/L (ref 98–111)
Creatinine: 1.56 mg/dL — ABNORMAL HIGH (ref 0.44–1.00)
GFR, Est AFR Am: 37 mL/min — ABNORMAL LOW (ref >60)
GFR, Estimated: 32 mL/min — ABNORMAL LOW (ref >60)
Glucose, Bld: 87 mg/dL (ref 70–99)
Potassium: 4.3 mmol/L (ref 3.5–5.1)
Sodium: 141 mmol/L (ref 135–145)
Total Bilirubin: 0.6 mg/dL (ref 0.3–1.2)
Total Protein: 6.6 g/dL (ref 6.5–8.1)

## 2019-04-30 MED ORDER — SODIUM CHLORIDE 0.9% FLUSH
10.0000 mL | Freq: Once | INTRAVENOUS | Status: AC
  Administered 2019-04-30: 10 mL
  Filled 2019-04-30: qty 10

## 2019-04-30 MED ORDER — HEPARIN SOD (PORK) LOCK FLUSH 100 UNIT/ML IV SOLN
250.0000 [IU] | Freq: Once | INTRAVENOUS | Status: AC
  Administered 2019-04-30: 500 [IU]
  Filled 2019-04-30: qty 5

## 2019-04-30 NOTE — Assessment & Plan Note (Signed)
She was recently diagnosed with congestive heart failure and is currently on medical management She has no signs or symptoms of fluid overload I would defer to her cardiologist for medical management

## 2019-04-30 NOTE — Assessment & Plan Note (Signed)
She has achieved complete response to treatment recently Continue supportive care I plan to order repeat CT imaging 6 months away from her recent imaging, meaning around October She will get port flush in 6 weeks and I will see her in about 12 weeks

## 2019-04-30 NOTE — Patient Instructions (Signed)

## 2019-04-30 NOTE — Progress Notes (Signed)
[place] OFFICE PROGRESS NOTE  Patient Care Team: Reubin Milan, MD as PCP - General (Internal Medicine) [name], MD as PCP - Cardiology (Cardiology) Carola Frost, RN as Registered Nurse (Medical Oncology) [name], MD as Attending Physician (Internal Medicine)  ASSESSMENT & PLAN:  Grade 3a follicular lymphoma of lymph nodes of multiple regions [facility]) She has achieved complete response to treatment recently Continue supportive care I plan to order repeat CT imaging 6 months away from her recent imaging, meaning around October She will get port flush in 6 weeks and I will see her in about 12 weeks  Acute systolic congestive heart failure ([place]) She was recently diagnosed with congestive heart failure and is currently on medical management She has no signs or symptoms of fluid overload I would defer to her cardiologist for medical management  Chronic kidney disease, stage III (moderate) ([place]) She has chronic kidney disease stage III.  This is likely related to cardiac issues Serum creatinine is stable. Continue medical management   Orders Placed This Encounter  Procedures  . CT CHEST W CONTRAST    Standing Status:   Future    Standing Expiration Date:   04/29/2020    Order Specific Question:   If indicated for the ordered procedure, I authorize the administration of contrast media per Radiology protocol    Answer:   Yes    Order Specific Question:   Preferred imaging location?    Answer:   [facility]    Order Specific Question:   Radiology Contrast Protocol - do NOT remove file path    Answer:   \\[email or web address].[email or web address]  . CT ABDOMEN PELVIS W CONTRAST    Standing Status:   Future    Standing Expiration Date:   04/29/2020    Order Specific Question:   If indicated for the ordered procedure, I authorize the administration of contrast media per Radiology protocol    Answer:   Yes    Order Specific Question:    Preferred imaging location?    Answer:   [facility]    Order Specific Question:   Radiology Contrast Protocol - do NOT remove file path    Answer:   \\[email or web address]    INTERVAL HISTORY: Please see below for problem oriented charting. She was recently hospitalized for acute congestive heart failure She is currently on medical management She continues to complain of shortness of breath on minimal exertion No recent infection, fever or chills No new lymphadenopathy  SUMMARY OF ONCOLOGIC HISTORY: Oncology History  Grade 3a follicular lymphoma of lymph nodes of multiple regions ([place])  01/21/2015 Surgery   She underwent excisional lymph node biopsy that came back follicular lymphoma grade 3   01/21/2015 Pathology Results   Accession: [id number] biopsy confirmed follicular lymphoma   04/08/6332 Imaging   Echocardiogram showed ejection fraction of 55-60%   02/11/2015 Imaging    PET CT scan show possible splenic involvement and diffuse lymphadenopathy throughout   02/11/2015 Bone Marrow Biopsy    bone marrow biopsy was performed and is involved by lymphoma with translocation of igH/BCL2   02/13/2015 Procedure   She had port placement.   02/17/2015 - 06/02/2015 Chemotherapy   She received R-CHOP chemo x 6   02/17/2015 Adverse Reaction   She had mild infusion reaction with cycle 1 of treatment.   04/18/2015 Imaging   PET CT scan showed near complete response to treatment.   04/22/2015 Adverse Reaction   Vincristine dose was reduced by  50% due to neuropathy from cycle 4 onwards   06/25/2015 - 07/06/2015 Hospital Admission   She was hospitalized for recent sepsis/bacteremia and a fib with RVR   07/11/2015 Imaging   repeat PEt scan showed complete response to Rx   07/14/2015 - 06/16/2017 Chemotherapy   She received maintenance Rituximab every 60 days   12/15/2015 Imaging   PET CT showed no evidence of cancer recurrence   06/15/2016 PET scan   No evidence of  active lymphoma on skullbase to thigh FDG PET scan. Small LEFT periaortic retroperitoneal lymph nodes without significant metabolic activity ( Deauville 1). No change from prior   03/24/2017 Imaging   1. Borderline enlarged abdominal retroperitoneal lymph nodes, stable. No new adenopathy in the chest, abdomen or pelvis. 2. Aortic atherosclerosis (ICD10-170.0). 3. Enlarged pulmonary arteries, indicative of pulmonary arterial hypertension.   09/14/2017 PET scan   Stable exam. No evidence of active lymphoma or other acute findings. (Deauville score 1)   08/08/2018 PET scan   1. Evidence of progressive/recurrent disease, as evidenced by new hypermetabolic nodes and subcutaneous nodule/nodes throughout left chest and abdomen. (Deauville 5). 2. Likely physiologic right nasopharyngeal hypermetabolism. Recommend attention on follow-up. 3. Incidental findings, including pulmonary artery enlargement, suggesting pulmonary arterial hypertension, stable left lower lobe pulmonary nodule, and aortic Atherosclerosis (ICD10-I70.0).   08/29/2018 Initial Biopsy   Soft tissue simple excision left back: Follicular lymphoma grade 1-2 positive for CD20, PAX5, bcl-6, and bcl-2.  Ki-67 30%   09/04/2018 -  Chemotherapy   The patient had Bendamustine and Rituximab   12/13/2018 Imaging   1. Response to therapy, as evidenced by resolution of left chest wall nodularity and decrease in size of small left axillary nodes. 2. No residual soft tissue thickening at the site of hypermetabolism along the posterior left eleventh rib. Resolution of adjacent subcutaneous nodularity. 3. No new or progressive disease identified. 4.  Aortic Atherosclerosis (ICD10-I70.0). 5. Bilateral pulmonary nodules, similar. 6. Trace air within the urinary bladder could be iatrogenic. Possible pericystic edema. Correlate with symptoms of cystitis and recent instrumentation.     REVIEW OF SYSTEMS:   Constitutional: Denies fevers, chills or  abnormal weight loss Eyes: Denies blurriness of vision Ears, nose, mouth, throat, and face: Denies mucositis or sore throat Gastrointestinal:  Denies nausea, heartburn or change in bowel habits Skin: Denies abnormal skin rashes Lymphatics: Denies new lymphadenopathy or easy bruising Neurological:Denies numbness, tingling or new weaknesses Behavioral/Psych: Mood is stable, no new changes  All other systems were reviewed with the patient and are negative.  I have reviewed the past medical history, past surgical history, social history and family history with the patient and they are unchanged from previous note.  ALLERGIES:  has No Known Allergies.  MEDICATIONS:  Current Outpatient Medications  Medication Sig Dispense Refill  . acetaminophen (TYLENOL) 500 MG tablet Take 500 mg by mouth every 6 (six) hours as needed for moderate pain or headache.    Marland Kitchen amiodarone (PACERONE) 200 MG tablet Take 1 tablet (200 mg total) by mouth daily. 90 tablet 3  . apixaban (ELIQUIS) 5 MG TABS tablet Take 5 mg by mouth 2 (two) times daily.     . bisacodyl (DULCOLAX) 5 MG EC tablet Take 5 mg by mouth daily as needed for moderate constipation.    . diclofenac sodium (VOLTAREN) 1 % GEL Apply 2 g topically 3 (three) times daily as needed (knee pain). 100 g 0  . levothyroxine (SYNTHROID, LEVOTHROID) 112 MCG tablet Take 112 mcg by mouth daily.  9  . lidocaine-prilocaine (EMLA) cream Apply to affected area once 30 g 3  . metoprolol succinate (TOPROL-XL) 25 MG 24 hr tablet Take 0.5 tablets (12.5 mg total) by mouth daily. Take with or immediately following a meal. 45 tablet 3  . polyvinyl alcohol (LIQUIFILM TEARS) 1.4 % ophthalmic solution Place 1 drop into both eyes as needed for dry eyes.    Marland Kitchen prochlorperazine (COMPAZINE) 10 MG tablet Take 1 tablet (10 mg total) by mouth every 6 (six) hours as needed (Nausea or vomiting). 30 tablet 1  . sacubitril-valsartan (ENTRESTO) 24-26 MG Take 1 tablet by mouth 2 (two) times  daily. 180 tablet 3   No current facility-administered medications for this visit.    Facility-Administered Medications Ordered in Other Visits  Medication Dose Route Frequency Provider Last Rate Last Dose  . ondansetron (ZOFRAN) 8 mg in sodium chloride 0.9 % 50 mL IVPB   Intravenous Once Elexa Kivi, MD      . sodium chloride 0.9 % injection 10 mL  10 mL Intravenous PRN Alvy Bimler, Anaeli Cornwall, MD   10 mL at 04/05/17 1315    PHYSICAL EXAMINATION: ECOG PERFORMANCE STATUS: 2 - Symptomatic, <50% confined to bed  Vitals:   04/30/19 1247  BP: (!) 151/60  Pulse: (!) 55  Resp: 18  Temp: 99.1 F (37.3 C)  SpO2: 100%   Filed Weights   04/30/19 1247  Weight: 137 lb 3.2 oz (62.2 kg)    GENERAL:alert, no distress and comfortable SKIN: skin color, texture, turgor are normal, no rashes or significant lesions EYES: normal, Conjunctiva are pink and non-injected, sclera clear OROPHARYNX:no exudate, no erythema and lips, buccal mucosa, and tongue normal  NECK: supple, thyroid normal size, non-tender, without nodularity LYMPH:  no palpable lymphadenopathy in the cervical, axillary or inguinal LUNGS: clear to auscultation and percussion with normal breathing effort HEART: regular rate & rhythm and no murmurs and no lower extremity edema ABDOMEN:abdomen soft, non-tender and normal bowel sounds Musculoskeletal:no cyanosis of digits and no clubbing  NEURO: alert & oriented x 3 with fluent speech, no focal motor/sensory deficits  LABORATORY DATA:  I have reviewed the data as listed    Component Value Date/Time   NA 141 04/30/2019 1220   NA 142 08/29/2017 0838   K 4.3 04/30/2019 1220   K 4.2 08/29/2017 0838   CL 108 04/30/2019 1220   CO2 24 04/30/2019 1220   CO2 23 08/29/2017 0838   GLUCOSE 87 04/30/2019 1220   GLUCOSE 97 08/29/2017 0838   BUN 35 (H) 04/30/2019 1220   BUN 24.0 08/29/2017 0838   CREATININE 1.56 (H) 04/30/2019 1220   CREATININE 1.0 08/29/2017 0838   CALCIUM 8.8 (L) 04/30/2019 1220    CALCIUM 8.9 08/29/2017 0838   PROT 6.6 04/30/2019 1220   PROT 6.4 08/29/2017 0838   ALBUMIN 4.2 04/30/2019 1220   ALBUMIN 3.8 08/29/2017 0838   AST 16 04/30/2019 1220   AST 18 08/29/2017 0838   ALT 11 04/30/2019 1220   ALT 16 08/29/2017 0838   ALKPHOS 81 04/30/2019 1220   ALKPHOS 93 08/29/2017 0838   BILITOT 0.6 04/30/2019 1220   BILITOT 0.33 08/29/2017 0838   GFRNONAA 32 (L) 04/30/2019 1220   GFRAA 37 (L) 04/30/2019 1220    No results found for: SPEP, UPEP  Lab Results  Component Value Date   WBC 6.3 04/30/2019   NEUTROABS 4.4 04/30/2019   HGB 12.9 04/30/2019   HCT 39.5 04/30/2019   MCV 95.0 04/30/2019   PLT 203 04/30/2019  Chemistry      Component Value Date/Time   NA 141 04/30/2019 1220   NA 142 08/29/2017 0838   K 4.3 04/30/2019 1220   K 4.2 08/29/2017 0838   CL 108 04/30/2019 1220   CO2 24 04/30/2019 1220   CO2 23 08/29/2017 0838   BUN 35 (H) 04/30/2019 1220   BUN 24.0 08/29/2017 0838   CREATININE 1.56 (H) 04/30/2019 1220   CREATININE 1.0 08/29/2017 0838      Component Value Date/Time   CALCIUM 8.8 (L) 04/30/2019 1220   CALCIUM 8.9 08/29/2017 0838   ALKPHOS 81 04/30/2019 1220   ALKPHOS 93 08/29/2017 0838   AST 16 04/30/2019 1220   AST 18 08/29/2017 0838   ALT 11 04/30/2019 1220   ALT 16 08/29/2017 0838   BILITOT 0.6 04/30/2019 1220   BILITOT 0.33 08/29/2017 0838     All questions were answered. The patient knows to call the clinic with any problems, questions or concerns. No barriers to learning was detected.  I spent 15 minutes counseling the patient face to face. The total time spent in the appointment was 20 minutes and more than 50% was on counseling and review of test results  [name], MD 04/30/2019 1:36 PM

## 2019-04-30 NOTE — Assessment & Plan Note (Signed)
She has chronic kidney disease stage III.  This is likely related to cardiac issues Serum creatinine is stable. Continue medical management

## 2019-05-01 ENCOUNTER — Telehealth: Payer: Medicare HMO | Admitting: Cardiology

## 2019-05-01 ENCOUNTER — Telehealth: Payer: Self-pay | Admitting: Hematology and Oncology

## 2019-05-01 NOTE — Telephone Encounter (Signed)
I left a message but it was static so I will mail

## 2019-05-24 ENCOUNTER — Ambulatory Visit (HOSPITAL_COMMUNITY): Payer: Medicare HMO | Attending: Cardiology

## 2019-05-24 ENCOUNTER — Other Ambulatory Visit: Payer: Self-pay

## 2019-05-24 DIAGNOSIS — I5022 Chronic systolic (congestive) heart failure: Secondary | ICD-10-CM

## 2019-05-28 ENCOUNTER — Telehealth: Payer: Self-pay | Admitting: Cardiology

## 2019-05-28 NOTE — Telephone Encounter (Signed)
New Message  Patient calling for the results of the Echocardiogram on 05/24/19. Please give patient a call back.  Patient also requesting for results to be sent to: Dr. Filbert Berthold- [facility]

## 2019-05-28 NOTE — Telephone Encounter (Signed)
Lm to call back ./cy 

## 2019-05-29 NOTE — Telephone Encounter (Signed)
Follow up    Patient is returning call in reference to echo results.

## 2019-05-29 NOTE — Telephone Encounter (Signed)
Left message for patient to call back for full details from her echo report which overall looked good

## 2019-05-30 NOTE — Telephone Encounter (Signed)
Pt verbalized understanding of her Echo results and will keep her upcoming appt with B Simmons PA on 06/01/19. She will wear her mask and call if she develops any problems prior to her appt.       COVID-19 Pre-Screening Questions:  . In the past 7 to 10 days have you had a cough,  shortness of breath, headache, congestion, fever (100 or greater) body aches, chills, sore throat, or sudden loss of taste or sense of smell? NO . Have you been around anyone with known Covid 19. NO  . Have you been around anyone who is awaiting Covid 19 test results in the past 7 to 10 days? NO  . Have you been around anyone who has been exposed to Covid 19, or has mentioned symptoms of Covid 19 within the past 7 to 10 days? NO   If you have any concerns/questions about symptoms patients report during screening (either on the phone or at threshold). Contact the provider seeing the patient or DOD for further guidance.  If neither are available contact a member of the leadership team.

## 2019-06-01 ENCOUNTER — Encounter: Payer: Self-pay | Admitting: Cardiology

## 2019-06-01 ENCOUNTER — Ambulatory Visit (INDEPENDENT_AMBULATORY_CARE_PROVIDER_SITE_OTHER): Payer: No Typology Code available for payment source | Admitting: Cardiology

## 2019-06-01 ENCOUNTER — Other Ambulatory Visit: Payer: Self-pay

## 2019-06-01 VITALS — BP 130/68 | HR 58 | Ht 61.0 in | Wt 141.4 lb

## 2019-06-01 DIAGNOSIS — Z5181 Encounter for therapeutic drug level monitoring: Secondary | ICD-10-CM | POA: Diagnosis not present

## 2019-06-01 NOTE — Patient Instructions (Signed)
Medication Instructions:  NONE If you need a refill on your cardiac medications before your next appointment, please call your pharmacy.   Lab work:  TODAY TSH If you have labs (blood work) drawn today and your tests are completely normal, you will receive your results only by: Marland Kitchen MyChart Message (if you have MyChart) OR . A paper copy in the mail If you have any lab test that is abnormal or we need to change your treatment, we will call you to review the results.  Testing/Procedures: NONE  Follow-Up: At [facility], you and your health needs are our priority.  As part of our continuing mission to provide you with exceptional heart care, we have created designated Provider Care Teams.  These Care Teams include your primary Cardiologist (physician) and Advanced Practice Providers (APPs -  Physician Assistants and Nurse Practitioners) who all work together to provide you with the care you need, when you need it. You will need a follow up appointment in 6 months.  Please call our office 2 months in advance to schedule this appointment.  You may see Ena Dawley, MD or one of the following Advanced Practice Providers on your designated Care Team:   [place], PA-C Melina Copa, PA-C . Ermalinda Barrios, PA-C  Any Other Special Instructions Will Be Listed Below (If Applicable).

## 2019-06-01 NOTE — Progress Notes (Signed)
06/01/2019 Johnney Ou Cindie Laroche   [birth year]  [id number]  Primary Physician Patient, No Pcp Per Primary Cardiologist: Ena Dawley, MD  Electrophysiologist: None   Reason for Visit/CC: F/u for Tachy mediated Cardiomyopathy  HPI:  She has aPMH of HTN, PAF (s/p recent cardioversion 02/16/7781), follicular lymphoma s/p chemo (last tx 01/25/2019), hypothyroidism, CKD stage 3and recent diagnosis of systolic HF. Of note, she had a NST in 2017 that was negative for ischemia and echo showed normal EFat that time.   On Feb 16, 2019, she presented to the ED w/ dyspnea and fatigue and was found to be in rapid atrial fibrillation andshe required DCCV and was placed on amiodarone. Echo was done and showed significantly reduced LVEF, down to 20-25% (previously normal). This was felt either tachy mediated from rapid afib vs chemo induced. No CP and no plans for ischemic w/u. She did require diuretics for volume overload. Was initially treated w/ IV Lasix then transitioned to PO. She was also placed on guidelines directed medical therapy w/ Coreg and Entresto.   She was readmitted again 5/17 for near syncope and was found to be orthostatic. Her Coreg and Delene Loll were initially held. Lasix discontinued. Pt monitored. Coreg and Entresto added back and BP normalized and symptoms resolved. Afterwards, she still has issues with soft BP and  blocker was changed from Coreg was to metoprolol. She tolerated this better.   04/25/19, she was seen by Roby Lofts, PA-C and was doing well w/o symptoms and tolerating medications ok. EKG showed NSR. Repeat echo was ordered and showed improved LVEF back to normal at 55-60%.  Today in f/u, she reports that she continues to do well. No palpitations. No CP of dyspnea. No recurrent syncope. Tolerating meds well. BP 130/68. HR 58. No abnormal bleeding w/ Eliquis. Her only complaint is that over the last 3 weeks, she has had hot flashes. Don't know why. Happening almost daily. She is  on amiodarone for afib. No other symptoms.   Cardiac Studies  2D Echo 05/24/19 The left ventricle has normal systolic function, with an ejection fraction of 55-60%. The cavity size was normal. Left ventricular diastolic Doppler parameters are consistent with pseudonormalization. No evidence of left ventricular regional wall motion abnormalities. 2. The right ventricle has normal systolic function. The cavity was normal. There is no increase in right ventricular wall thickness. 3. Left atrial size was mildly dilated. 4. No evidence of mitral valve stenosis. Mild mitral regurgitation. 5. The aortic valve is tricuspid. Aortic valve regurgitation is trivial by color flow Doppler. No stenosis of the aortic valve. 6. The aorta is normal in size and structure. 7. The aortic root is normal in size and structure. 8. The IVC was normal in size. PA systolic pressure 28 mmHg.    Current Meds  Medication Sig   acetaminophen (TYLENOL) 500 MG tablet Take 500 mg by mouth every 6 (six) hours as needed for moderate pain or headache.   amiodarone (PACERONE) 200 MG tablet Take 1 tablet (200 mg total) by mouth daily.   apixaban (ELIQUIS) 5 MG TABS tablet Take 5 mg by mouth 2 (two) times daily.    bisacodyl (DULCOLAX) 5 MG EC tablet Take 5 mg by mouth daily as needed for moderate constipation.   diclofenac sodium (VOLTAREN) 1 % GEL Apply 2 g topically 3 (three) times daily as needed (knee pain).   levothyroxine (SYNTHROID, LEVOTHROID) 112 MCG tablet Take 112 mcg by mouth daily.    lidocaine-prilocaine (EMLA) cream Apply to affected  area once   metoprolol succinate (TOPROL-XL) 25 MG 24 hr tablet Take 0.5 tablets (12.5 mg total) by mouth daily. Take with or immediately following a meal.   polyvinyl alcohol (LIQUIFILM TEARS) 1.4 % ophthalmic solution Place 1 drop into both eyes as needed for dry eyes.   prochlorperazine (COMPAZINE) 10 MG tablet Take 1 tablet (10 mg total) by mouth every 6 (six) hours as  needed (Nausea or vomiting).   sacubitril-valsartan (ENTRESTO) 24-26 MG Take 1 tablet by mouth 2 (two) times daily.   No Known Allergies Past Medical History:  Diagnosis Date   A-fib ([place])    Anemia    "years ago"   Atrial fibrillation with rapid ventricular response ([place]) 06/25/2015   Carpal tunnel syndrome, bilateral    Cervical spondylosis without myelopathy 10/25/2013   Dental bridge present    DJD (degenerative joint disease)    Dysrhythmia    WENT INTO A FIB IN 2017    Elevated troponin 10/26/4172   Follicular lymphoma grade 3a ([place]) 02/03/2015   History of echocardiogram    Echo 12/16: EF 60-65%, no RWMA, severe LAE   History of nuclear stress test    Myoview 1/17: EF 55%, Normal study. No ischemia or scar.   Hypothyroid    Nausea without vomiting 06/20/2015   Pneumonia    Stage III chronic kidney disease ([place]) 07/01/2015   Thrush of mouth and esophagus ([place]) 06/25/2015   Family History  Problem Relation Age of Onset   Cancer Mother        Breast, lung NHL   Cancer Sister        Multiple myeloma   Past Surgical History:  Procedure Laterality Date   ABDOMINAL HYSTERECTOMY     APPENDECTOMY     BACK SURGERY     X5-lumbar-fusion   COLONOSCOPY     DILATION AND CURETTAGE OF UTERUS     LYMPH NODE BIOPSY Right 01/21/2015   Procedure: RIGHT GROIN LYMPH NODE BIOPSY;  Surgeon: Erroll Luna, MD;  Location: [facility];  Service: General;  Laterality: Right;   MASS EXCISION Left 08/29/2018   Procedure: EXCISION LEFT BACK  MASS;  Surgeon: Erroll Luna, MD;  Location: [facility];  Service: General;  Laterality: Left;   Ovarian cyst resection     patelar tendon transplants     Left/right   PORTACATH PLACEMENT Right 02/13/2015   Procedure: INSERTION PORT-A-CATH WITH ULTRASOUND;  Surgeon: Erroll Luna, MD;  Location: [facility];  Service: General;  Laterality: Right;   PORTACATH PLACEMENT N/A 08/29/2018   Procedure: INSERTION PORT-A-CATH WITH ULTRA  SOUND ERAS PATHWAY;  Surgeon: Erroll Luna, MD;  Location: [facility];  Service: General;  Laterality: N/A;   TOTAL KNEE ARTHROPLASTY  2011   Right   TUBAL LIGATION     Social History   Socioeconomic History   Marital status: Widowed    Spouse name: Not on file   Number of children: 2   Years of education: hs   Highest education level: Not on file  Occupational History   Occupation: Retired  Scientist, product/process development strain: Not on file   Food insecurity    Worry: Not on file    Inability: Not on Lexicographer needs    Medical: Not on file    Non-medical: Not on file  Tobacco Use   Smoking status: Never Smoker   Smokeless tobacco: Never Used  Substance and Sexual Activity   Alcohol use: Yes  Comment: occassional wine   Drug use: No   Sexual activity: Not on file  Lifestyle   Physical activity    Days per week: Not on file    Minutes per session: Not on file   Stress: Not on file  Relationships   Social connections    Talks on phone: Not on file    Gets together: Not on file    Attends religious service: Not on file    Active member of club or organization: Not on file    Attends meetings of clubs or organizations: Not on file    Relationship status: Not on file   Intimate partner violence    Fear of current or ex partner: Not on file    Emotionally abused: Not on file    Physically abused: Not on file    Forced sexual activity: Not on file  Other Topics Concern   Not on file  Social History Narrative   Lives alone.  Has a walker for home use.     Lipid Panel  No results found for: CHOL, TRIG, HDL, CHOLHDL, VLDL, LDLCALC, LDLDIRECT  Review of Systems: General: negative for chills, fever, night sweats or weight changes.  Cardiovascular: negative for chest pain, dyspnea on exertion, edema, orthopnea, palpitations, paroxysmal nocturnal dyspnea or shortness of breath Dermatological: negative for rash Respiratory: negative  for cough or wheezing Urologic: negative for hematuria Abdominal: negative for nausea, vomiting, diarrhea, bright red blood per rectum, melena, or hematemesis Neurologic: negative for visual changes, syncope, or dizziness All other systems reviewed and are otherwise negative except as noted above.   Physical Exam:  Blood pressure 130/68, pulse (!) 58, height '5\' 1"'  (1.549 m), weight 141 lb 6.4 oz (64.1 kg), SpO2 98 %.  General appearance: alert, cooperative and no distress Neck: no carotid bruit and no JVD Lungs: clear to auscultation bilaterally Heart: regular rate and rhythm, S1, S2 normal, no murmur, click, rub or gallop Extremities: extremities normal, atraumatic, no cyanosis or edema Pulses: 2+ and symmetric Skin: Skin color, texture, turgor normal. No rashes or lesions Neurologic: Grossly normal  EKG not performed -- personally reviewed   ASSESSMENT AND PLAN:   1. Systolic HF/ NICM: suspect tachy mediated as LVEF normalized post conversion from AFib to NSR. EF improved from 25%>>55-60%. Will continue metoprolol and Entresto. Euvolemic on exam.   2. Atrial Fibrillation: treated w/ DCCV in May. Maintaining NSR w/ amiodarone. HR controlled w/ metoprolol. On Eliquis for a/c. No abnormal bleeding.   3. HTN: controlled on current regimen.   4. Hot Flashes: on amiodarone for afib. ? Hyperthyroidism. Will check TSH.   5. Follicular lymphoma:  s/p chemo (last tx 01/25/2019)   Follow-Up with 6 months.   Sheryl Saintil Ladoris Gene, MHS [facility] 06/01/2019 2:42 PM

## 2019-06-02 LAB — TSH: TSH: 0.413 u[IU]/mL — ABNORMAL LOW (ref 0.450–4.500)

## 2019-06-04 ENCOUNTER — Telehealth: Payer: Self-pay

## 2019-06-04 DIAGNOSIS — R7989 Other specified abnormal findings of blood chemistry: Secondary | ICD-10-CM

## 2019-06-04 NOTE — Telephone Encounter (Signed)
Notes recorded by Frederik Schmidt, RN on 06/04/2019 at 12:45 PM EDT  The patient has been notified of the result and verbalized understanding. All questions (if any) were answered.  Frederik Schmidt, RN 06/04/2019 12:45 PM

## 2019-06-04 NOTE — Telephone Encounter (Signed)
-----   Message from Consuelo Pandy, [place] sent at 06/04/2019 12:25 PM EDT ----- TSH is low. Have pt come back in for repeat TSH and Free T3 and Free T4. If f/u labs are abnormal, then we may need to stop amiodarone. This may be the cause of her hot flashes.

## 2019-06-05 ENCOUNTER — Other Ambulatory Visit: Payer: Self-pay

## 2019-06-05 ENCOUNTER — Other Ambulatory Visit: Payer: No Typology Code available for payment source

## 2019-06-05 DIAGNOSIS — R7989 Other specified abnormal findings of blood chemistry: Secondary | ICD-10-CM

## 2019-06-05 LAB — T4, FREE: Free T4: 2.02 ng/dL — ABNORMAL HIGH (ref 0.82–1.77)

## 2019-06-05 LAB — T3, FREE: T3, Free: 2 pg/mL (ref 2.0–4.4)

## 2019-06-05 LAB — TSH: TSH: 0.656 u[IU]/mL (ref 0.450–4.500)

## 2019-06-06 ENCOUNTER — Telehealth: Payer: Self-pay

## 2019-06-06 DIAGNOSIS — E0590 Thyrotoxicosis, unspecified without thyrotoxic crisis or storm: Secondary | ICD-10-CM

## 2019-06-06 NOTE — Telephone Encounter (Signed)
Notes recorded by Frederik Schmidt, RN on 06/06/2019 at 12:28 PM EDT  The patient has been notified of the result and verbalized understanding. All questions (if any) were answered.  Frederik Schmidt, RN 06/06/2019 12:28 PM

## 2019-06-06 NOTE — Telephone Encounter (Signed)
-----   Message from Consuelo Pandy, [place] sent at 06/06/2019 12:14 PM EDT ----- Her TSH is low and Free T4 is elevated. Lab values are consistent with hyperthyroidism (over production of thyroid hormone). This is the likely cause of her recent hot flashes and thyroid dysfunction likely caused by amiodarone. STOP AMIODARONE. Have her return for f/u labs in 1 week, repeat TSH and Free T3 and T4 to see if numbers are improving.

## 2019-06-12 ENCOUNTER — Telehealth: Payer: Self-pay | Admitting: Cardiology

## 2019-06-12 NOTE — Telephone Encounter (Signed)
I spoke to the patient who is concerned about her BP.  She is asymptomatic and I told her that she is on the borderline with her BP.  Our goal is usually at 130/80.  She is taking Metoprolol succinate 12.5 mg daily.  She wanted [place] to know that she has gained 3 lbs in 1 week also.  She has labs (TSH, Free T3 and T4) scheduled 8/26.  Please advise, thank you.

## 2019-06-12 NOTE — Telephone Encounter (Signed)
° ° °  Pt c/o BP issue: STAT if pt c/o blurred vision, one-sided weakness or slurred speech  1. What are your last 5 BP readings? 133/79 HR 54, 122/78 HR 59, 135/81 HR 60, 137/76 HR 65  2. Are you having any other symptoms (ex. Dizziness, headache, blurred vision, passed out)? NO 3. What is your BP issue? Patient states she is concerned BP not controlled

## 2019-06-13 ENCOUNTER — Other Ambulatory Visit: Payer: Self-pay

## 2019-06-13 ENCOUNTER — Other Ambulatory Visit: Payer: Medicare HMO | Admitting: *Deleted

## 2019-06-13 ENCOUNTER — Encounter: Payer: Self-pay | Admitting: Cardiology

## 2019-06-13 DIAGNOSIS — E0590 Thyrotoxicosis, unspecified without thyrotoxic crisis or storm: Secondary | ICD-10-CM | POA: Diagnosis not present

## 2019-06-13 LAB — T4, FREE: Free T4: 1.85 ng/dL — ABNORMAL HIGH (ref 0.82–1.77)

## 2019-06-13 LAB — TSH: TSH: 0.946 u[IU]/mL (ref 0.450–4.500)

## 2019-06-13 LAB — T3, FREE: T3, Free: 2.1 pg/mL (ref 2.0–4.4)

## 2019-06-14 ENCOUNTER — Other Ambulatory Visit: Payer: Self-pay

## 2019-06-14 DIAGNOSIS — E038 Other specified hypothyroidism: Secondary | ICD-10-CM

## 2019-06-14 NOTE — Telephone Encounter (Signed)
I spoke to the patient with Carrie [name]'s recommendations.  She verbalized understanding.

## 2019-06-20 ENCOUNTER — Telehealth: Payer: Self-pay | Admitting: Cardiology

## 2019-06-20 MED ORDER — METOPROLOL SUCCINATE ER 25 MG PO TB24: 12.5000 mg | ORAL_TABLET | Freq: Every day | ORAL | 3 refills | Status: DC

## 2019-06-20 NOTE — Telephone Encounter (Signed)
I spoke to the patient who called because her BP this morning was 91/60 with a HR of 54.  She feels fine and will hold her Metoprolol Succinate today.  She continues to take 12.5 mg Daily.  She will continue to monitor BP and HR, informing us of trend next week.

## 2019-06-20 NOTE — Telephone Encounter (Signed)
New message   Pt c/o medication issue:  1. Name of Medication: metoprolol succinate (TOPROL-XL) 25 MG 24 hr tablet  2. How are you currently taking this medication (dosage and times per day)? 1 time daily  3. Are you having a reaction (difficulty breathing--STAT)?no   4. What is your medication issue? Patient wants to know if she should continue to take this medication. Patient states that her b/p is low. Please advise.

## 2019-06-29 ENCOUNTER — Telehealth: Payer: Self-pay | Admitting: Cardiology

## 2019-06-29 NOTE — Telephone Encounter (Signed)
New message   Patient states that she had labs done at the [place] in [place] and she states that she has renal issues and needs to stop sacubitril-valsartan (ENTRESTO) 24-26 MG. Please call to discuss.

## 2019-06-29 NOTE — Telephone Encounter (Signed)
Left message to call back  

## 2019-06-29 NOTE — Telephone Encounter (Signed)
Please have her fax [place] new labs, also if she stays off entresto she needs to be seen followed here and started on some other agent, thank you

## 2019-06-29 NOTE — Telephone Encounter (Signed)
I spoke with pt. She reports she had lab work done today at [facility] and she received a call from [place] instructing her to stop Entresto based on renal function. She is having repeat lab work on Monday. Pt wanted to make Dr. Meda Coffee aware.  I asked pt to have [facility] fax lab work to our office.

## 2019-07-02 NOTE — Progress Notes (Addendum)
07/03/2019 Carrie [name]   Aug 03, 1941  [id number]  Primary Physician Patient, No Pcp Per Primary Cardiologist: Ena Dawley, MD  Electrophysiologist: None   Reason for Visit/CC: 1 month f/u for Tachy mediated Cardiomyopathy  HPI:  She has aPMH of HTN, PAF (s/p recent cardioversion 02/15/257), follicular lymphoma s/p chemo (last tx 01/25/2019), hypothyroidism, CKD stage 3and recent diagnosis of systolic HF. Of note, she had a NST in 2017 that was negative for ischemia and echo showed normal EFat that time.   On Feb 16, 2019, she presented to the ED w/ dyspnea and fatigue and was found to be in rapid atrial fibrillation andshe required DCCV and was placed on amiodarone. Echo was done and showed significantly reduced LVEF, down to 20-25% (previously normal). This was felt either tachy mediated from rapid afib vs chemo induced. No CP and no plans for ischemic w/u. She did require diuretics for volume overload. Was initially treated w/ IV Lasix then transitioned to PO. She was also placed on guidelines directed medical therapy w/ Coreg and Entresto.   She was readmitted again 5/17 for near syncope and was found to be orthostatic. Her Coreg and Delene Loll were initially held. Lasix discontinued. Pt monitored. Coreg and Entresto added back and BP normalized and symptoms resolved. Afterwards, she still has issues with soft BP and  blocker was changed from Coreg was to metoprolol. She tolerated this better.   04/25/19, she was seen by Roby Lofts, PA-C and was doing well w/o symptoms and tolerating medications ok. EKG showed NSR. Repeat echo was ordered and showed improved LVEF back to normal at 55-60%.  07/03/2019 - 4 weeks follow up, she was seen on 06/07/2019 when she was doing well, however had labs drawn in [place] with worsening Crea and was instructed to hold Entresto. Her most recent echo on 05/22/2019 showed normalization of LVEF, she remains asymptomatic, denies DOE, CP, LE edema, No palpitations,  she is compliant with her meds. No bleeding.  Cardiac Studies  2D Echo 05/24/19 The left ventricle has normal systolic function, with an ejection fraction of 55-60%. The cavity size was normal. Left ventricular diastolic Doppler parameters are consistent with pseudonormalization. No evidence of left ventricular regional wall motion abnormalities. 2. The right ventricle has normal systolic function. The cavity was normal. There is no increase in right ventricular wall thickness. 3. Left atrial size was mildly dilated. 4. No evidence of mitral valve stenosis. Mild mitral regurgitation. 5. The aortic valve is tricuspid. Aortic valve regurgitation is trivial by color flow Doppler. No stenosis of the aortic valve. 6. The aorta is normal in size and structure. 7. The aortic root is normal in size and structure. 8. The IVC was normal in size. PA systolic pressure 28 mmHg.    Current Meds  Medication Sig  . acetaminophen (TYLENOL) 500 MG tablet Take 500 mg by mouth every 6 (six) hours as needed for moderate pain or headache.  Marland Kitchen apixaban (ELIQUIS) 5 MG TABS tablet Take 5 mg by mouth 2 (two) times daily.   . bisacodyl (DULCOLAX) 5 MG EC tablet Take 5 mg by mouth daily as needed for moderate constipation.  . diclofenac sodium (VOLTAREN) 1 % GEL Apply 2 g topically 3 (three) times daily as needed (knee pain).  Marland Kitchen levothyroxine (SYNTHROID, LEVOTHROID) 112 MCG tablet Take 112 mcg by mouth daily.   Marland Kitchen lidocaine-prilocaine (EMLA) cream Apply to affected area once  . metoprolol succinate (TOPROL-XL) 25 MG 24 hr tablet Take 0.5 tablets (12.5 mg total)  by mouth daily. Take with or immediately following a meal.  . polyvinyl alcohol (LIQUIFILM TEARS) 1.4 % ophthalmic solution Place 1 drop into both eyes as needed for dry eyes.   Allergies  Allergen Reactions  . Amiodarone Other (See Comments)    Hyperthyroidism    Past Medical History:  Diagnosis Date  . A-fib ([place])   . Anemia    "years ago"  . Atrial  fibrillation with rapid ventricular response ([place]) 06/25/2015  . Carpal tunnel syndrome, bilateral   . Cervical spondylosis without myelopathy 10/25/2013  . Dental bridge present   . DJD (degenerative joint disease)   . Dysrhythmia    WENT INTO A FIB IN 2017   . Elevated troponin 02/18/2015  . Follicular lymphoma grade 3a ([place]) 02/03/2015  . History of echocardiogram    Echo 12/16: EF 60-65%, no RWMA, severe LAE  . History of nuclear stress test    Myoview 1/17: EF 55%, Normal study. No ischemia or scar.  . Hypothyroid   . Nausea without vomiting 06/20/2015  . Pneumonia   . Stage III chronic kidney disease ([place]) 07/01/2015  . Thrush of mouth and esophagus ([place]) 06/25/2015   Family History  Problem Relation Age of Onset  . Cancer Mother        Breast, lung NHL  . Cancer Sister        Multiple myeloma   Past Surgical History:  Procedure Laterality Date  . ABDOMINAL HYSTERECTOMY    . APPENDECTOMY    . BACK SURGERY     X5-lumbar-fusion  . COLONOSCOPY    . DILATION AND CURETTAGE OF UTERUS    . LYMPH NODE BIOPSY Right 01/21/2015   Procedure: RIGHT GROIN LYMPH NODE BIOPSY;  Surgeon: Erroll Luna, MD;  Location: [facility];  Service: General;  Laterality: Right;  . MASS EXCISION Left 08/29/2018   Procedure: EXCISION LEFT BACK  MASS;  Surgeon: Erroll Luna, MD;  Location: [facility];  Service: General;  Laterality: Left;  . Ovarian cyst resection    . patelar tendon transplants     Left/right  . PORTACATH PLACEMENT Right 02/13/2015   Procedure: INSERTION PORT-A-CATH WITH ULTRASOUND;  Surgeon: Erroll Luna, MD;  Location: [facility];  Service: General;  Laterality: Right;  . PORTACATH PLACEMENT N/A 08/29/2018   Procedure: INSERTION PORT-A-CATH WITH ULTRA SOUND ERAS PATHWAY;  Surgeon: Erroll Luna, MD;  Location: [facility];  Service: General;  Laterality: N/A;  . TOTAL KNEE ARTHROPLASTY  2011   Right  . TUBAL LIGATION     Social History   Socioeconomic History  . Marital status:  Widowed    Spouse name: Not on file  . Number of children: 2  . Years of education: hs  . Highest education level: Not on file  Occupational History  . Occupation: Retired  Scientific laboratory technician  . Financial resource strain: Not on file  . Food insecurity    Worry: Not on file    Inability: Not on file  . Transportation needs    Medical: Not on file    Non-medical: Not on file  Tobacco Use  . Smoking status: Never Smoker  . Smokeless tobacco: Never Used  Substance and Sexual Activity  . Alcohol use: Yes    Comment: occassional wine  . Drug use: No  . Sexual activity: Not on file  Lifestyle  . Physical activity    Days per week: Not on file    Minutes per session: Not on file  . Stress:  Not on file  Relationships  . Social Herbalist on phone: Not on file    Gets together: Not on file    Attends religious service: Not on file    Active member of club or organization: Not on file    Attends meetings of clubs or organizations: Not on file    Relationship status: Not on file  . Intimate partner violence    Fear of current or ex partner: Not on file    Emotionally abused: Not on file    Physically abused: Not on file    Forced sexual activity: Not on file  Other Topics Concern  . Not on file  Social History Narrative   Lives alone.  Has a walker for home use.     Lipid Panel  No results found for: CHOL, TRIG, HDL, CHOLHDL, VLDL, LDLCALC, LDLDIRECT  Review of Systems: General: negative for chills, fever, night sweats or weight changes.  Cardiovascular: negative for chest pain, dyspnea on exertion, edema, orthopnea, palpitations, paroxysmal nocturnal dyspnea or shortness of breath Dermatological: negative for rash Respiratory: negative for cough or wheezing Urologic: negative for hematuria Abdominal: negative for nausea, vomiting, diarrhea, bright red blood per rectum, melena, or hematemesis Neurologic: negative for visual changes, syncope, or dizziness All other  systems reviewed and are otherwise negative except as noted above.   Physical Exam:  Blood pressure 136/84, pulse (!) 58, height '5\' 1"'  (1.549 m), weight 141 lb 6.4 oz (64.1 kg), SpO2 98 %.  General appearance: alert, cooperative and no distress Neck: no carotid bruit and no JVD Lungs: clear to auscultation bilaterally Heart: regular rate and rhythm, S1, S2 normal, no murmur, click, rub or gallop Extremities: extremities normal, atraumatic, no cyanosis or edema Pulses: 2+ and symmetric Skin: Skin color, texture, turgor normal. No rashes or lesions Neurologic: Grossly normal  EKG performed, it shows sinus bradycardia 55 bpm, poor R wave progression in the anterior leads, unchanged from prior, personally reviewed     ASSESSMENT AND PLAN:   1. Systolic HF/ NICM: suspect tachy mediated as LVEF normalized post conversion from AFib to NSR. EF improved from 25%>>55-60%.  She had finding of worsening creatinine on her last blood work in [place], we do not have dose but we will obtain.  Considering that her LVEF has normalized I would hold Entresto, patient is advised to monitor for any worsening symptoms.  She is currently euvolemic, I would continue low-dose Toprol-XL 12.5 mg daily.  We will follow-up in 6 months, based on her symptoms we might repeat echocardiogram at that time.  I just obtain her labs from [facility] [place], her creatinine on September 11 was 2.3, this was improved to 1.8 on September 14, her baseline is 1.1-1.5, [facility] and [facility] obtained in 2 weeks.  We will avoid any ACE or ARBs in the future.  2. Atrial Fibrillation: treated w/ DCCV in May. Maintaining NSR w/ amiodarone. HR controlled w/ low-dose metoprolol. On Eliquis for a/c. No abnormal bleeding.  We will obtain labs from [place] to check on hemoglobin.  3. HTN: controlled on current regimen.   4. Follicular lymphoma:  s/p chemo (last tx 01/25/2019)  Follow-Up with 6 months.   Ena Dawley, MD [facility] 07/03/2019 2:37 PM

## 2019-07-02 NOTE — Telephone Encounter (Signed)
Spoke with the pt and she stated she is going back right now to the [facility], to have her repeat labs done, from recent [facility] and then they held her Entresto.  Endorsed to the pt that Dr. Meda Coffee needs to have those labs faxed to her ASAP at [phone] ATTN: Dr. Meda Coffee, so that we can advise on her Carmin Muskrat or any other alternative treatment option in its place.  Also informed the pt that Dr. Meda Coffee said if she is going to be off of Entresto, she needs to be followed here and started on some other agent.   Pt states she would like to see Dr. Meda Coffee in the office this week, to get this issue addressed, and being she is the one following all her cardiac meds, and another Provider held her Delene Loll, she thinks its the best decision just to get in to see Dr. Meda Coffee, to have this addressed immediately.  Agreed with the pt and scheduled her to come in and see Dr. Meda Coffee in the office tomorrow 9/15 at 1:40 pm.  Advised the pt to go ahead and tell the [facility] to fax the lab results they have on hand to the office today.  Pt verbalized understanding and agrees with this plan.

## 2019-07-03 ENCOUNTER — Other Ambulatory Visit: Payer: Self-pay

## 2019-07-03 ENCOUNTER — Encounter: Payer: Self-pay | Admitting: Cardiology

## 2019-07-03 ENCOUNTER — Telehealth: Payer: Self-pay

## 2019-07-03 ENCOUNTER — Ambulatory Visit (INDEPENDENT_AMBULATORY_CARE_PROVIDER_SITE_OTHER): Payer: Medicare HMO | Admitting: Cardiology

## 2019-07-03 VITALS — BP 136/84 | HR 58 | Ht 61.0 in | Wt 141.4 lb

## 2019-07-03 DIAGNOSIS — I10 Essential (primary) hypertension: Secondary | ICD-10-CM | POA: Diagnosis not present

## 2019-07-03 DIAGNOSIS — I5022 Chronic systolic (congestive) heart failure: Secondary | ICD-10-CM

## 2019-07-03 DIAGNOSIS — I480 Paroxysmal atrial fibrillation: Secondary | ICD-10-CM | POA: Diagnosis not present

## 2019-07-03 DIAGNOSIS — Z79899 Other long term (current) drug therapy: Secondary | ICD-10-CM

## 2019-07-03 DIAGNOSIS — Z7901 Long term (current) use of anticoagulants: Secondary | ICD-10-CM

## 2019-07-03 NOTE — Patient Instructions (Signed)
Medication Instructions:  Your physician recommends that you continue on your current medications as directed. Please refer to the Current Medication list given to you today.  If you need a refill on your cardiac medications before your next appointment, please call your pharmacy.   Lab work: none If you have labs (blood work) drawn today and your tests are completely normal, you will receive your results only by: Marland Kitchen MyChart Message (if you have MyChart) OR . A paper copy in the mail If you have any lab test that is abnormal or we need to change your treatment, we will call you to review the results.  Testing/Procedures: none  Follow-Up: At [facility], you and your health needs are our priority.  As part of our continuing mission to provide you with exceptional heart care, we have created designated Provider Care Teams.  These Care Teams include your primary Cardiologist (physician) and Advanced Practice Providers (APPs -  Physician Assistants and Nurse Practitioners) who all work together to provide you with the care you need, when you need it. You will need a follow up appointment in 6 months.  Please call our office 2 months in advance to schedule this appointment.  You may see Ena Dawley, MD or one of the following Advanced Practice Providers on your designated Care Team:   [place]on, PA-C Melina Copa, PA-C . Ermalinda Barrios, PA-C  Any Other Special Instructions Will Be Listed Below (If Applicable).

## 2019-07-03 NOTE — Telephone Encounter (Signed)
Spoke with the pt and she will make appt at the [facility] for her BMET in 2 weeks.

## 2019-07-03 NOTE — Telephone Encounter (Signed)
Lm for pt to call back... need Bmet in 2 weeks at [facility] per Dr. Meda Coffee after reviewing her recent [facility] labs after her 07/03/19 appt. (to recheck her elevated Creatinine)

## 2019-07-04 NOTE — Telephone Encounter (Signed)
I spoke to the patient and arranged BMET for 9/28 along with other labs.

## 2019-07-04 NOTE — Addendum Note (Signed)
Addended by: Frederik Schmidt on: 07/04/2019 09:56 AM   Modules accepted: Orders

## 2019-07-06 NOTE — Addendum Note (Signed)
Addended by: Nuala Alpha on: 07/06/2019 04:05 PM   Modules accepted: Orders

## 2019-07-13 ENCOUNTER — Telehealth: Payer: Self-pay

## 2019-07-13 NOTE — Telephone Encounter (Signed)
She called to verify appt date and times. Called back and given appt details. She verbalized understanding.

## 2019-07-16 ENCOUNTER — Other Ambulatory Visit: Payer: Medicare HMO | Admitting: *Deleted

## 2019-07-16 ENCOUNTER — Other Ambulatory Visit: Payer: Self-pay

## 2019-07-16 DIAGNOSIS — E038 Other specified hypothyroidism: Secondary | ICD-10-CM

## 2019-07-16 DIAGNOSIS — Z79899 Other long term (current) drug therapy: Secondary | ICD-10-CM

## 2019-07-16 LAB — BASIC METABOLIC PANEL
BUN/Creatinine Ratio: 18 (ref 12–28)
BUN: 27 mg/dL (ref 8–27)
CO2: 24 mmol/L (ref 20–29)
Calcium: 9.3 mg/dL (ref 8.7–10.3)
Chloride: 104 mmol/L (ref 96–106)
Creatinine, Ser: 1.48 mg/dL — ABNORMAL HIGH (ref 0.57–1.00)
GFR calc Af Amer: 39 mL/min/{1.73_m2} — ABNORMAL LOW (ref >59)
GFR calc non Af Amer: 34 mL/min/{1.73_m2} — ABNORMAL LOW (ref >59)
Glucose: 110 mg/dL — ABNORMAL HIGH (ref 65–99)
Potassium: 4.3 mmol/L (ref 3.5–5.2)
Sodium: 143 mmol/L (ref 134–144)

## 2019-07-16 LAB — T3, FREE: T3, Free: 2.5 pg/mL (ref 2.0–4.4)

## 2019-07-16 LAB — T4, FREE: Free T4: 2.13 ng/dL — ABNORMAL HIGH (ref 0.82–1.77)

## 2019-07-16 LAB — TSH: TSH: 0.582 u[IU]/mL (ref 0.450–4.500)

## 2019-07-17 ENCOUNTER — Telehealth: Payer: Self-pay | Admitting: *Deleted

## 2019-07-17 DIAGNOSIS — I495 Sick sinus syndrome: Secondary | ICD-10-CM

## 2019-07-17 NOTE — Telephone Encounter (Signed)
BP looks good - HR a little slow in the 50's and had not taken her BB prior.  Please get a 24 hour Holter to assess average HR

## 2019-07-17 NOTE — Telephone Encounter (Signed)
I spoke with pt and reviewed BMP results with her. She reports the following BP/pulse readings-  9/29-140/86,56 9/28-139/77-59 9/27-135/82,59 9/26-136/81,52 9/25-134/83,52 9/24-130/72,56  Readings were taken between 8:00-8:30 AM.  She takes Toprol about 8:30 AM daily

## 2019-07-17 NOTE — Telephone Encounter (Signed)
I spoke to the patient with Dr Theodosia Blender advisement.  She verbalized understanding that I will order the 24 hour Holter monitor.

## 2019-07-18 ENCOUNTER — Telehealth: Payer: Self-pay | Admitting: *Deleted

## 2019-07-18 ENCOUNTER — Telehealth: Payer: Self-pay | Admitting: Cardiology

## 2019-07-18 NOTE — Telephone Encounter (Signed)
3 day ZIO XT long term holter monitor to be mailed to the patients home.  Instructions reviewed briefly as they are included in the monitor kit. 

## 2019-07-18 NOTE — Telephone Encounter (Signed)
FW: 24 hour holter monitor per Dr. Radford Pax covering for Meda Coffee Received: Today Message Contents  Rock Nephew, [facility], LPN        We should have her results approx. 08/02/19.  Thanks,  [employer]

## 2019-07-18 NOTE — Telephone Encounter (Signed)
New Message    Patient called back stating she will wait until she gets the monitor and if she has any question she will call in.

## 2019-07-23 ENCOUNTER — Ambulatory Visit (INDEPENDENT_AMBULATORY_CARE_PROVIDER_SITE_OTHER): Payer: Medicare HMO

## 2019-07-23 ENCOUNTER — Other Ambulatory Visit: Payer: No Typology Code available for payment source

## 2019-07-23 DIAGNOSIS — I495 Sick sinus syndrome: Secondary | ICD-10-CM

## 2019-07-24 NOTE — Telephone Encounter (Signed)
Follow Up   Patient is calling back to follow up about how long she should be wearing the monitor. Please call patient back to advise.

## 2019-07-24 NOTE — Telephone Encounter (Signed)
Patient informed her monitor was enrolled for 3 days.

## 2019-07-30 ENCOUNTER — Inpatient Hospital Stay: Payer: No Typology Code available for payment source | Attending: Hematology and Oncology

## 2019-07-30 ENCOUNTER — Inpatient Hospital Stay: Payer: No Typology Code available for payment source

## 2019-07-30 ENCOUNTER — Ambulatory Visit (HOSPITAL_COMMUNITY)
Admission: RE | Admit: 2019-07-30 | Discharge: 2019-07-30 | Disposition: A | Discharge status: Home / Self Care | Payer: Medicare HMO | Source: Ambulatory Visit | Attending: Hematology and Oncology | Admitting: Hematology and Oncology

## 2019-07-30 ENCOUNTER — Other Ambulatory Visit: Payer: Self-pay

## 2019-07-30 DIAGNOSIS — N183 Chronic kidney disease, stage 3 unspecified: Secondary | ICD-10-CM | POA: Insufficient documentation

## 2019-07-30 DIAGNOSIS — Z7901 Long term (current) use of anticoagulants: Secondary | ICD-10-CM | POA: Insufficient documentation

## 2019-07-30 DIAGNOSIS — C8238 Follicular lymphoma grade IIIa, lymph nodes of multiple sites: Secondary | ICD-10-CM

## 2019-07-30 DIAGNOSIS — Z79899 Other long term (current) drug therapy: Secondary | ICD-10-CM | POA: Diagnosis not present

## 2019-07-30 DIAGNOSIS — F419 Anxiety disorder, unspecified: Secondary | ICD-10-CM | POA: Insufficient documentation

## 2019-07-30 DIAGNOSIS — Z791 Long term (current) use of non-steroidal anti-inflammatories (NSAID): Secondary | ICD-10-CM | POA: Diagnosis not present

## 2019-07-30 DIAGNOSIS — R918 Other nonspecific abnormal finding of lung field: Secondary | ICD-10-CM | POA: Diagnosis not present

## 2019-07-30 DIAGNOSIS — C8590 Non-Hodgkin lymphoma, unspecified, unspecified site: Secondary | ICD-10-CM | POA: Diagnosis not present

## 2019-07-30 DIAGNOSIS — Z23 Encounter for immunization: Secondary | ICD-10-CM | POA: Diagnosis not present

## 2019-07-30 DIAGNOSIS — I1310 Hypertensive heart and chronic kidney disease without heart failure, with stage 1 through stage 4 chronic kidney disease, or unspecified chronic kidney disease: Secondary | ICD-10-CM | POA: Insufficient documentation

## 2019-07-30 LAB — CBC WITH DIFFERENTIAL (CANCER CENTER ONLY)
Abs Immature Granulocytes: 0.01 10*3/uL (ref 0.00–0.07)
Basophils Absolute: 0 10*3/uL (ref 0.0–0.1)
Basophils Relative: 0 %
Eosinophils Absolute: 0.1 10*3/uL (ref 0.0–0.5)
Eosinophils Relative: 4 %
HCT: 40.3 % (ref 36.0–46.0)
Hemoglobin: 13.2 g/dL (ref 12.0–15.0)
Immature Granulocytes: 0 %
Lymphocytes Relative: 9 %
Lymphs Abs: 0.3 10*3/uL — ABNORMAL LOW (ref 0.7–4.0)
MCH: 31.1 pg (ref 26.0–34.0)
MCHC: 32.8 g/dL (ref 30.0–36.0)
MCV: 95 fL (ref 80.0–100.0)
Monocytes Absolute: 0.5 10*3/uL (ref 0.1–1.0)
Monocytes Relative: 13 %
Neutro Abs: 2.6 10*3/uL (ref 1.7–7.7)
Neutrophils Relative %: 74 %
Platelet Count: 196 10*3/uL (ref 150–400)
RBC: 4.24 MIL/uL (ref 3.87–5.11)
RDW: 12.9 % (ref 11.5–15.5)
WBC Count: 3.5 10*3/uL — ABNORMAL LOW (ref 4.0–10.5)
nRBC: 0 % (ref 0.0–0.2)

## 2019-07-30 LAB — CMP (CANCER CENTER ONLY)
ALT: 9 U/L (ref 0–44)
AST: 13 U/L — ABNORMAL LOW (ref 15–41)
Albumin: 4.2 g/dL (ref 3.5–5.0)
Alkaline Phosphatase: 95 U/L (ref 38–126)
Anion gap: 12 (ref 5–15)
BUN: 25 mg/dL — ABNORMAL HIGH (ref 8–23)
CO2: 26 mmol/L (ref 22–32)
Calcium: 9.4 mg/dL (ref 8.9–10.3)
Chloride: 104 mmol/L (ref 98–111)
Creatinine: 1.51 mg/dL — ABNORMAL HIGH (ref 0.44–1.00)
GFR, Est AFR Am: 39 mL/min — ABNORMAL LOW (ref >60)
GFR, Estimated: 33 mL/min — ABNORMAL LOW (ref >60)
Glucose, Bld: 102 mg/dL — ABNORMAL HIGH (ref 70–99)
Potassium: 4.5 mmol/L (ref 3.5–5.1)
Sodium: 142 mmol/L (ref 135–145)
Total Bilirubin: 0.5 mg/dL (ref 0.3–1.2)
Total Protein: 6.7 g/dL (ref 6.5–8.1)

## 2019-07-30 MED ORDER — SODIUM CHLORIDE (PF) 0.9 % IJ SOLN
INTRAMUSCULAR | Status: AC
  Filled 2019-07-30: qty 50

## 2019-07-30 MED ORDER — SODIUM CHLORIDE 0.9% FLUSH
10.0000 mL | Freq: Once | INTRAVENOUS | Status: AC
  Administered 2019-07-30: 10 mL
  Filled 2019-07-30: qty 10

## 2019-07-30 MED ORDER — HEPARIN SOD (PORK) LOCK FLUSH 100 UNIT/ML IV SOLN
INTRAVENOUS | Status: AC
  Filled 2019-07-30: qty 5

## 2019-07-30 MED ORDER — HEPARIN SOD (PORK) LOCK FLUSH 100 UNIT/ML IV SOLN
500.0000 [IU] | Freq: Once | INTRAVENOUS | Status: AC
  Administered 2019-07-30: 500 [IU] via INTRAVENOUS

## 2019-07-30 MED ORDER — IOHEXOL 300 MG/ML  SOLN
75.0000 mL | Freq: Once | INTRAMUSCULAR | Status: AC | PRN
  Administered 2019-07-30: 75 mL via INTRAVENOUS

## 2019-07-30 NOTE — Patient Instructions (Signed)

## 2019-07-30 NOTE — Progress Notes (Signed)
Port left accessed for CT appt with power port needle, CT aware.

## 2019-07-31 ENCOUNTER — Inpatient Hospital Stay (HOSPITAL_BASED_OUTPATIENT_CLINIC_OR_DEPARTMENT_OTHER): Payer: No Typology Code available for payment source | Admitting: Hematology and Oncology

## 2019-07-31 ENCOUNTER — Telehealth: Payer: Self-pay | Admitting: Hematology and Oncology

## 2019-07-31 ENCOUNTER — Encounter: Payer: Self-pay | Admitting: Hematology and Oncology

## 2019-07-31 ENCOUNTER — Other Ambulatory Visit: Payer: Self-pay

## 2019-07-31 VITALS — BP 158/70 | HR 60 | Temp 98.5°F | Resp 18 | Ht 61.0 in | Wt 140.0 lb

## 2019-07-31 DIAGNOSIS — Z23 Encounter for immunization: Secondary | ICD-10-CM

## 2019-07-31 DIAGNOSIS — N183 Chronic kidney disease, stage 3 unspecified: Secondary | ICD-10-CM

## 2019-07-31 DIAGNOSIS — I129 Hypertensive chronic kidney disease with stage 1 through stage 4 chronic kidney disease, or unspecified chronic kidney disease: Secondary | ICD-10-CM | POA: Diagnosis not present

## 2019-07-31 DIAGNOSIS — C8238 Follicular lymphoma grade IIIa, lymph nodes of multiple sites: Secondary | ICD-10-CM

## 2019-07-31 DIAGNOSIS — I10 Essential (primary) hypertension: Secondary | ICD-10-CM

## 2019-07-31 MED ORDER — INFLUENZA VAC A&B SA ADJ QUAD 0.5 ML IM PRSY
PREFILLED_SYRINGE | INTRAMUSCULAR | Status: AC
  Filled 2019-07-31: qty 0.5

## 2019-07-31 MED ORDER — INFLUENZA VAC A&B SA ADJ QUAD 0.5 ML IM PRSY
0.5000 mL | PREFILLED_SYRINGE | Freq: Once | INTRAMUSCULAR | Status: AC
  Administered 2019-07-31: 0.5 mL via INTRAMUSCULAR

## 2019-07-31 NOTE — Assessment & Plan Note (Signed)
She has chronic kidney disease stage III.  This is likely related to cardiac issues Continue medical management 

## 2019-07-31 NOTE — Assessment & Plan Note (Signed)
She has elevated blood pressure today, likely secondary to anxiety Recommend close follow-up with cardiologist and primary care doctor for medication adjustment

## 2019-07-31 NOTE — Assessment & Plan Note (Signed)
Her blood work and imaging studies show no evidence of cancer recurrence She is at high risk of recurrent cancer given multiple relapse I will see her again in 2 months for blood work, port flush maintenance as well as examination

## 2019-07-31 NOTE — Progress Notes (Signed)
[place] OFFICE PROGRESS NOTE  Patient Care Team: Domenick Bookbinder, MD as PCP - General (Internal Medicine) [name], MD as PCP - Cardiology (Cardiology) Carola Frost, RN as Registered Nurse (Medical Oncology) [name], MD as Attending Physician (Internal Medicine)  ASSESSMENT & PLAN:  Grade 3a follicular lymphoma of lymph nodes of multiple regions [facility]) Her blood work and imaging studies show no evidence of cancer recurrence She is at high risk of recurrent cancer given multiple relapse I will see her again in 2 months for blood work, port flush maintenance as well as examination  Chronic kidney disease, stage III (moderate) ([place]) She has chronic kidney disease stage III.  This is likely related to cardiac issues Continue medical management  Essential hypertension She has elevated blood pressure today, likely secondary to anxiety Recommend close follow-up with cardiologist and primary care doctor for medication adjustment   No orders of the defined types were placed in this encounter.   INTERVAL HISTORY: Please see below for problem oriented charting. She returns today to review test results Denies recent infection, fever or chills No new lymphadenopathy She brought up concerns about recent abnormal blood work, specifically related to chronic kidney disease She denies leg edema No recent chest pain or shortness of breath  SUMMARY OF ONCOLOGIC HISTORY: Oncology History  Grade 3a follicular lymphoma of lymph nodes of multiple regions ([place])  01/21/2015 Surgery   She underwent excisional lymph node biopsy that came back follicular lymphoma grade 3   01/21/2015 Pathology Results   Accession: [id number] biopsy confirmed follicular lymphoma   1/65/7903 Imaging   Echocardiogram showed ejection fraction of 55-60%   02/11/2015 Imaging    PET CT scan show possible splenic involvement and diffuse lymphadenopathy throughout   02/11/2015 Bone Marrow  Biopsy    bone marrow biopsy was performed and is involved by lymphoma with translocation of igH/BCL2   02/13/2015 Procedure   She had port placement.   02/17/2015 - 06/02/2015 Chemotherapy   She received R-CHOP chemo x 6   02/17/2015 Adverse Reaction   She had mild infusion reaction with cycle 1 of treatment.   04/18/2015 Imaging   PET CT scan showed near complete response to treatment.   04/22/2015 Adverse Reaction   Vincristine dose was reduced by 50% due to neuropathy from cycle 4 onwards   06/25/2015 - 07/06/2015 Hospital Admission   She was hospitalized for recent sepsis/bacteremia and a fib with RVR   07/11/2015 Imaging   repeat PEt scan showed complete response to Rx   07/14/2015 - 06/16/2017 Chemotherapy   She received maintenance Rituximab every 60 days   12/15/2015 Imaging   PET CT showed no evidence of cancer recurrence   06/15/2016 PET scan   No evidence of active lymphoma on skullbase to thigh FDG PET scan. Small LEFT periaortic retroperitoneal lymph nodes without significant metabolic activity ( Deauville 1). No change from prior   03/24/2017 Imaging   1. Borderline enlarged abdominal retroperitoneal lymph nodes, stable. No new adenopathy in the chest, abdomen or pelvis. 2. Aortic atherosclerosis (ICD10-170.0). 3. Enlarged pulmonary arteries, indicative of pulmonary arterial hypertension.   09/14/2017 PET scan   Stable exam. No evidence of active lymphoma or other acute findings. (Deauville score 1)   08/08/2018 PET scan   1. Evidence of progressive/recurrent disease, as evidenced by new hypermetabolic nodes and subcutaneous nodule/nodes throughout left chest and abdomen. (Deauville 5). 2. Likely physiologic right nasopharyngeal hypermetabolism. Recommend attention on follow-up. 3. Incidental findings, including pulmonary  artery enlargement, suggesting pulmonary arterial hypertension, stable left lower lobe pulmonary nodule, and aortic Atherosclerosis (ICD10-I70.0).    08/29/2018 Initial Biopsy   Soft tissue simple excision left back: Follicular lymphoma grade 1-2 positive for CD20, PAX5, bcl-6, and bcl-2.  Ki-67 30%   09/11/2018 - 02/20/2019 Chemotherapy   The patient had Bendamustine and Rituximab   12/13/2018 Imaging   1. Response to therapy, as evidenced by resolution of left chest wall nodularity and decrease in size of small left axillary nodes. 2. No residual soft tissue thickening at the site of hypermetabolism along the posterior left eleventh rib. Resolution of adjacent subcutaneous nodularity. 3. No new or progressive disease identified. 4.  Aortic Atherosclerosis (ICD10-I70.0). 5. Bilateral pulmonary nodules, similar. 6. Trace air within the urinary bladder could be iatrogenic. Possible pericystic edema. Correlate with symptoms of cystitis and recent instrumentation.   07/30/2019 Imaging   Ct chest, abdomen and pelvis No findings suspicious for active lymphoma in the chest, abdomen, or pelvis.   Small bilateral pulmonary nodules, unchanged, benign.   Again noted is trace nondependent gas within the bladder. Correlate for recent intervention.     REVIEW OF SYSTEMS:   Constitutional: Denies fevers, chills or abnormal weight loss Eyes: Denies blurriness of vision Ears, nose, mouth, throat, and face: Denies mucositis or sore throat Respiratory: Denies cough, dyspnea or wheezes Cardiovascular: Denies palpitation, chest discomfort or lower extremity swelling Gastrointestinal:  Denies nausea, heartburn or change in bowel habits Skin: Denies abnormal skin rashes Lymphatics: Denies new lymphadenopathy or easy bruising Neurological:Denies numbness, tingling or new weaknesses Behavioral/Psych: Mood is stable, no new changes  All other systems were reviewed with the patient and are negative.  I have reviewed the past medical history, past surgical history, social history and family history with the patient and they are unchanged from previous  note.  ALLERGIES:  is allergic to amiodarone.  MEDICATIONS:  Current Outpatient Medications  Medication Sig Dispense Refill  . acetaminophen (TYLENOL) 500 MG tablet Take 500 mg by mouth every 6 (six) hours as needed for moderate pain or headache.    Marland Kitchen apixaban (ELIQUIS) 5 MG TABS tablet Take 5 mg by mouth 2 (two) times daily.     . bisacodyl (DULCOLAX) 5 MG EC tablet Take 5 mg by mouth daily as needed for moderate constipation.    . diclofenac sodium (VOLTAREN) 1 % GEL Apply 2 g topically 3 (three) times daily as needed (knee pain). 100 g 0  . levothyroxine (SYNTHROID, LEVOTHROID) 112 MCG tablet Take 112 mcg by mouth daily.   9  . lidocaine-prilocaine (EMLA) cream Apply to affected area once 30 g 3  . metoprolol succinate (TOPROL-XL) 25 MG 24 hr tablet Take 0.5 tablets (12.5 mg total) by mouth daily. Take with or immediately following a meal. 90 tablet 3  . polyvinyl alcohol (LIQUIFILM TEARS) 1.4 % ophthalmic solution Place 1 drop into both eyes as needed for dry eyes.     No current facility-administered medications for this visit.    Facility-Administered Medications Ordered in Other Visits  Medication Dose Route Frequency Provider Last Rate Last Dose  . ondansetron (ZOFRAN) 8 mg in sodium chloride 0.9 % 50 mL IVPB   Intravenous Once Etna Forquer, MD      . sodium chloride 0.9 % injection 10 mL  10 mL Intravenous PRN Alvy Bimler, Malavika Lira, MD   10 mL at 04/05/17 1315    PHYSICAL EXAMINATION: ECOG PERFORMANCE STATUS: 1 - Symptomatic but completely ambulatory  Vitals:   07/31/19 1004  BP: (!) 158/70  Pulse: 60  Resp: 18  Temp: 98.5 F (36.9 C)  SpO2: 100%   Filed Weights   07/31/19 1004  Weight: 140 lb (63.5 kg)    GENERAL:alert, no distress and comfortable SKIN: skin color, texture, turgor are normal, no rashes or significant lesions EYES: normal, Conjunctiva are pink and non-injected, sclera clear OROPHARYNX:no exudate, no erythema and lips, buccal mucosa, and tongue normal  NECK:  supple, thyroid normal size, non-tender, without nodularity LYMPH:  no palpable lymphadenopathy in the cervical, axillary or inguinal LUNGS: clear to auscultation and percussion with normal breathing effort HEART: regular rate & rhythm and no murmurs and no lower extremity edema ABDOMEN:abdomen soft, non-tender and normal bowel sounds Musculoskeletal:no cyanosis of digits and no clubbing  NEURO: alert & oriented x 3 with fluent speech, no focal motor/sensory deficits  LABORATORY DATA:  I have reviewed the data as listed    Component Value Date/Time   NA 142 07/30/2019 1010   NA 143 07/16/2019 1132   NA 142 08/29/2017 0838   K 4.5 07/30/2019 1010   K 4.2 08/29/2017 0838   CL 104 07/30/2019 1010   CO2 26 07/30/2019 1010   CO2 23 08/29/2017 0838   GLUCOSE 102 (H) 07/30/2019 1010   GLUCOSE 97 08/29/2017 0838   BUN 25 (H) 07/30/2019 1010   BUN 27 07/16/2019 1132   BUN 24.0 08/29/2017 0838   CREATININE 1.51 (H) 07/30/2019 1010   CREATININE 1.0 08/29/2017 0838   CALCIUM 9.4 07/30/2019 1010   CALCIUM 8.9 08/29/2017 0838   PROT 6.7 07/30/2019 1010   PROT 6.4 08/29/2017 0838   ALBUMIN 4.2 07/30/2019 1010   ALBUMIN 3.8 08/29/2017 0838   AST 13 (L) 07/30/2019 1010   AST 18 08/29/2017 0838   ALT 9 07/30/2019 1010   ALT 16 08/29/2017 0838   ALKPHOS 95 07/30/2019 1010   ALKPHOS 93 08/29/2017 0838   BILITOT 0.5 07/30/2019 1010   BILITOT 0.33 08/29/2017 0838   GFRNONAA 33 (L) 07/30/2019 1010   GFRAA 39 (L) 07/30/2019 1010    No results found for: SPEP, UPEP  Lab Results  Component Value Date   WBC 3.5 (L) 07/30/2019   NEUTROABS 2.6 07/30/2019   HGB 13.2 07/30/2019   HCT 40.3 07/30/2019   MCV 95.0 07/30/2019   PLT 196 07/30/2019      Chemistry      Component Value Date/Time   NA 142 07/30/2019 1010   NA 143 07/16/2019 1132   NA 142 08/29/2017 0838   K 4.5 07/30/2019 1010   K 4.2 08/29/2017 0838   CL 104 07/30/2019 1010   CO2 26 07/30/2019 1010   CO2 23 08/29/2017 0838    BUN 25 (H) 07/30/2019 1010   BUN 27 07/16/2019 1132   BUN 24.0 08/29/2017 0838   CREATININE 1.51 (H) 07/30/2019 1010   CREATININE 1.0 08/29/2017 0838      Component Value Date/Time   CALCIUM 9.4 07/30/2019 1010   CALCIUM 8.9 08/29/2017 0838   ALKPHOS 95 07/30/2019 1010   ALKPHOS 93 08/29/2017 0838   AST 13 (L) 07/30/2019 1010   AST 18 08/29/2017 0838   ALT 9 07/30/2019 1010   ALT 16 08/29/2017 0838   BILITOT 0.5 07/30/2019 1010   BILITOT 0.33 08/29/2017 0838       RADIOGRAPHIC STUDIES: I have personally reviewed the radiological images as listed and agreed with the findings in the report. Ct Chest W Contrast  Result Date: 07/30/2019 CLINICAL DATA:  Non-Hodgkin's lymphoma, chemotherapy complete EXAM: CT CHEST, ABDOMEN, AND PELVIS WITH CONTRAST TECHNIQUE: Multidetector CT imaging of the chest, abdomen and pelvis was performed following the standard protocol during bolus administration of intravenous contrast. CONTRAST:  15m OMNIPAQUE IOHEXOL 300 MG/ML  SOLN COMPARISON:  CTA chest, CT abdomen/pelvis dated 01/30/2019 FINDINGS: CT CHEST FINDINGS Cardiovascular: Cardiomegaly.  No pericardial effusion. No evidence of thoracic aortic aneurysm. Atherosclerotic calcifications of the aortic arch. Enlargement the main pulmonary artery, suggesting pulmonary arterial hypertension. Right chest port terminates in the lower SVC. Mediastinum/Nodes: No suspicious mediastinal, hilar, or axillary lymphadenopathy. Visualized thyroid is unremarkable. Lungs/Pleura: Evaluation of the lung parenchyma is mildly constrained by respiratory motion. 3 mm nodule along the right minor fissure (series 7/image 81), unchanged. 4 mm subpleural nodule in the left lower lobe (series 7/image 88), unchanged. 2 mm subpleural nodule in the anterior left lower lobe (series 7/image 84), unchanged. Mild dependent atelectasis in the bilateral upper and lower lobes. Mild ground-glass opacity/mosaic attenuation in the bilateral lower  lobes, favoring atelectasis. No focal consolidation. No pleural effusion or pneumothorax. Musculoskeletal: Thoracic spine is within normal limits. CT ABDOMEN PELVIS FINDINGS Hepatobiliary: Liver is within normal limits. Gallbladder is unremarkable. No intrahepatic or extrahepatic ductal dilatation. Pancreas: Within normal limits. Spleen: 5 mm probable cyst in the central spleen (series 2/image 48). Normal in size. Adrenals/Urinary Tract: Adrenal glands are within normal limits. Kidneys are within normal limits.  No hydronephrosis. Bladder is thick-walled although underdistended. Trace nondependent gas, correlate for recent intervention. Stomach/Bowel: Stomach is within normal limits. No evidence of bowel obstruction. Appendix is not discretely visualized. Vascular/Lymphatic: No evidence of abdominal aortic aneurysm. Atherosclerotic calcifications of the abdominal aorta and branch vessels. No suspicious abdominopelvic lymphadenopathy. Reproductive: Status post hysterectomy. No adnexal masses. Other: No abdominopelvic ascites. Musculoskeletal: Status post PLIF at L3-5. Grade 1 anterolisthesis of L3 on L4. IMPRESSION: No findings suspicious for active lymphoma in the chest, abdomen, or pelvis. Small bilateral pulmonary nodules, unchanged, benign. Again noted is trace nondependent gas within the bladder. Correlate for recent intervention. Electronically Signed   By: SJulian HyM.D.   On: 07/30/2019 13:43   Ct Abdomen Pelvis W Contrast  Result Date: 07/30/2019 CLINICAL DATA:  Non-Hodgkin's lymphoma, chemotherapy complete EXAM: CT CHEST, ABDOMEN, AND PELVIS WITH CONTRAST TECHNIQUE: Multidetector CT imaging of the chest, abdomen and pelvis was performed following the standard protocol during bolus administration of intravenous contrast. CONTRAST:  728mOMNIPAQUE IOHEXOL 300 MG/ML  SOLN COMPARISON:  CTA chest, CT abdomen/pelvis dated 01/30/2019 FINDINGS: CT CHEST FINDINGS Cardiovascular: Cardiomegaly.  No  pericardial effusion. No evidence of thoracic aortic aneurysm. Atherosclerotic calcifications of the aortic arch. Enlargement the main pulmonary artery, suggesting pulmonary arterial hypertension. Right chest port terminates in the lower SVC. Mediastinum/Nodes: No suspicious mediastinal, hilar, or axillary lymphadenopathy. Visualized thyroid is unremarkable. Lungs/Pleura: Evaluation of the lung parenchyma is mildly constrained by respiratory motion. 3 mm nodule along the right minor fissure (series 7/image 81), unchanged. 4 mm subpleural nodule in the left lower lobe (series 7/image 88), unchanged. 2 mm subpleural nodule in the anterior left lower lobe (series 7/image 84), unchanged. Mild dependent atelectasis in the bilateral upper and lower lobes. Mild ground-glass opacity/mosaic attenuation in the bilateral lower lobes, favoring atelectasis. No focal consolidation. No pleural effusion or pneumothorax. Musculoskeletal: Thoracic spine is within normal limits. CT ABDOMEN PELVIS FINDINGS Hepatobiliary: Liver is within normal limits. Gallbladder is unremarkable. No intrahepatic or extrahepatic ductal dilatation. Pancreas: Within normal limits. Spleen: 5 mm probable cyst in the central spleen (series 2/image  48). Normal in size. Adrenals/Urinary Tract: Adrenal glands are within normal limits. Kidneys are within normal limits.  No hydronephrosis. Bladder is thick-walled although underdistended. Trace nondependent gas, correlate for recent intervention. Stomach/Bowel: Stomach is within normal limits. No evidence of bowel obstruction. Appendix is not discretely visualized. Vascular/Lymphatic: No evidence of abdominal aortic aneurysm. Atherosclerotic calcifications of the abdominal aorta and branch vessels. No suspicious abdominopelvic lymphadenopathy. Reproductive: Status post hysterectomy. No adnexal masses. Other: No abdominopelvic ascites. Musculoskeletal: Status post PLIF at L3-5. Grade 1 anterolisthesis of L3 on L4.  IMPRESSION: No findings suspicious for active lymphoma in the chest, abdomen, or pelvis. Small bilateral pulmonary nodules, unchanged, benign. Again noted is trace nondependent gas within the bladder. Correlate for recent intervention. Electronically Signed   By: Julian Hy M.D.   On: 07/30/2019 13:43    All questions were answered. The patient knows to call the clinic with any problems, questions or concerns. No barriers to learning was detected.  I spent 15 minutes counseling the patient face to face. The total time spent in the appointment was 20 minutes and more than 50% was on counseling and review of test results  [name], MD 07/31/2019 12:05 PM

## 2019-07-31 NOTE — Telephone Encounter (Signed)
I talk with patient regarding schedule  

## 2019-08-06 DIAGNOSIS — I495 Sick sinus syndrome: Secondary | ICD-10-CM | POA: Diagnosis not present

## 2019-09-24 ENCOUNTER — Inpatient Hospital Stay: Payer: Medicare HMO | Attending: Hematology and Oncology | Admitting: Hematology and Oncology

## 2019-09-24 ENCOUNTER — Other Ambulatory Visit: Payer: Self-pay

## 2019-09-24 ENCOUNTER — Other Ambulatory Visit: Payer: Self-pay | Admitting: Hematology and Oncology

## 2019-09-24 ENCOUNTER — Inpatient Hospital Stay: Payer: Medicare HMO

## 2019-09-24 DIAGNOSIS — C8238 Follicular lymphoma grade IIIa, lymph nodes of multiple sites: Secondary | ICD-10-CM

## 2019-09-24 DIAGNOSIS — I700 Atherosclerosis of aorta: Secondary | ICD-10-CM | POA: Insufficient documentation

## 2019-09-24 DIAGNOSIS — I10 Essential (primary) hypertension: Secondary | ICD-10-CM | POA: Diagnosis not present

## 2019-09-24 DIAGNOSIS — Z7901 Long term (current) use of anticoagulants: Secondary | ICD-10-CM | POA: Diagnosis not present

## 2019-09-24 DIAGNOSIS — I2510 Atherosclerotic heart disease of native coronary artery without angina pectoris: Secondary | ICD-10-CM | POA: Diagnosis not present

## 2019-09-24 DIAGNOSIS — D72819 Decreased white blood cell count, unspecified: Secondary | ICD-10-CM

## 2019-09-24 DIAGNOSIS — Z79899 Other long term (current) drug therapy: Secondary | ICD-10-CM | POA: Diagnosis not present

## 2019-09-24 DIAGNOSIS — Z452 Encounter for adjustment and management of vascular access device: Secondary | ICD-10-CM | POA: Insufficient documentation

## 2019-09-24 LAB — CBC WITH DIFFERENTIAL/PLATELET
Abs Immature Granulocytes: 0.04 10*3/uL (ref 0.00–0.07)
Basophils Absolute: 0 10*3/uL (ref 0.0–0.1)
Basophils Relative: 1 %
Eosinophils Absolute: 0.2 10*3/uL (ref 0.0–0.5)
Eosinophils Relative: 7 %
HCT: 39.3 % (ref 36.0–46.0)
Hemoglobin: 12.5 g/dL (ref 12.0–15.0)
Immature Granulocytes: 2 %
Lymphocytes Relative: 9 %
Lymphs Abs: 0.2 10*3/uL — ABNORMAL LOW (ref 0.7–4.0)
MCH: 30.2 pg (ref 26.0–34.0)
MCHC: 31.8 g/dL (ref 30.0–36.0)
MCV: 94.9 fL (ref 80.0–100.0)
Monocytes Absolute: 0.4 10*3/uL (ref 0.1–1.0)
Monocytes Relative: 18 %
Neutro Abs: 1.5 10*3/uL — ABNORMAL LOW (ref 1.7–7.7)
Neutrophils Relative %: 63 %
Platelets: 193 10*3/uL (ref 150–400)
RBC: 4.14 MIL/uL (ref 3.87–5.11)
RDW: 12.5 % (ref 11.5–15.5)
WBC: 2.3 10*3/uL — ABNORMAL LOW (ref 4.0–10.5)
nRBC: 0 % (ref 0.0–0.2)

## 2019-09-24 LAB — COMPREHENSIVE METABOLIC PANEL
ALT: 11 U/L (ref 0–44)
AST: 15 U/L (ref 15–41)
Albumin: 3.8 g/dL (ref 3.5–5.0)
Alkaline Phosphatase: 93 U/L (ref 38–126)
Anion gap: 9 (ref 5–15)
BUN: 24 mg/dL — ABNORMAL HIGH (ref 8–23)
CO2: 25 mmol/L (ref 22–32)
Calcium: 8.7 mg/dL — ABNORMAL LOW (ref 8.9–10.3)
Chloride: 107 mmol/L (ref 98–111)
Creatinine, Ser: 1.15 mg/dL — ABNORMAL HIGH (ref 0.44–1.00)
GFR calc Af Amer: 54 mL/min — ABNORMAL LOW (ref >60)
GFR calc non Af Amer: 46 mL/min — ABNORMAL LOW (ref >60)
Glucose, Bld: 89 mg/dL (ref 70–99)
Potassium: 4.4 mmol/L (ref 3.5–5.1)
Sodium: 141 mmol/L (ref 135–145)
Total Bilirubin: 0.5 mg/dL (ref 0.3–1.2)
Total Protein: 6.2 g/dL — ABNORMAL LOW (ref 6.5–8.1)

## 2019-09-24 MED ORDER — SODIUM CHLORIDE 0.9% FLUSH
10.0000 mL | Freq: Once | INTRAVENOUS | Status: AC
  Administered 2019-09-24: 10 mL
  Filled 2019-09-24: qty 10

## 2019-09-24 MED ORDER — HEPARIN SOD (PORK) LOCK FLUSH 100 UNIT/ML IV SOLN
500.0000 [IU] | Freq: Once | INTRAVENOUS | Status: AC
  Administered 2019-09-24: 500 [IU]
  Filled 2019-09-24: qty 5

## 2019-09-25 ENCOUNTER — Telehealth: Payer: Self-pay | Admitting: Hematology and Oncology

## 2019-09-25 ENCOUNTER — Encounter: Payer: Self-pay | Admitting: Hematology and Oncology

## 2019-09-25 DIAGNOSIS — D72819 Decreased white blood cell count, unspecified: Secondary | ICD-10-CM | POA: Insufficient documentation

## 2019-09-25 NOTE — Telephone Encounter (Signed)
Scheduled appt per 12/7 sch message - pt aware of apt date and time

## 2019-09-25 NOTE — Assessment & Plan Note (Signed)
Her blood pressure is elevated She have cardiovascular risk factors It could be attributed to anxiety today I recommend close follow-up with primary care doctor and her cardiologist for medical management

## 2019-09-25 NOTE — Progress Notes (Signed)
[facility] OFFICE PROGRESS NOTE  Patient Care Team: Domenick Bookbinder, MD as PCP - General (Internal Medicine) [name], MD as PCP - Cardiology (Cardiology) Carola Frost, RN as Registered Nurse (Medical Oncology) [name], MD as Attending Physician (Internal Medicine)  ASSESSMENT & PLAN:  Grade 3a follicular lymphoma of lymph nodes of multiple regions [facility]) Her blood work and recent imaging studies showed no evidence of cancer recurrence Her examination is benign She is at high risk of recurrent cancer given multiple relapse I will see her again in 2 months for blood work, port flush maintenance as well as examination  Chronic leukopenia She is not symptomatic We discussed neutropenic precaution She is up-to-date with influenza vaccination It could be related to previous side effects of treatment I plan to order serum vitamin B12 in her next visit  Essential hypertension Her blood pressure is elevated She have cardiovascular risk factors It could be attributed to anxiety today I recommend close follow-up with primary care doctor and her cardiologist for medical management   Orders Placed This Encounter  Procedures  . Vitamin B12    Standing Status:   Future    Standing Expiration Date:   10/29/2020    INTERVAL HISTORY: Please see below for problem oriented charting. She returns for further follow-up She has no recent exacerbation of congestive heart failure No recent bleeding No new lymphadenopathy Denies recent infection, fever or chills  SUMMARY OF ONCOLOGIC HISTORY: Oncology History  Grade 3a follicular lymphoma of lymph nodes of multiple regions ([place])  01/21/2015 Surgery   She underwent excisional lymph node biopsy that came back follicular lymphoma grade 3   01/21/2015 Pathology Results   Accession: [id number] biopsy confirmed follicular lymphoma   5/99/3570 Imaging   Echocardiogram showed ejection fraction of 55-60%   02/11/2015  Imaging    PET CT scan show possible splenic involvement and diffuse lymphadenopathy throughout   02/11/2015 Bone Marrow Biopsy    bone marrow biopsy was performed and is involved by lymphoma with translocation of igH/BCL2   02/13/2015 Procedure   She had port placement.   02/17/2015 - 06/02/2015 Chemotherapy   She received R-CHOP chemo x 6   02/17/2015 Adverse Reaction   She had mild infusion reaction with cycle 1 of treatment.   04/18/2015 Imaging   PET CT scan showed near complete response to treatment.   04/22/2015 Adverse Reaction   Vincristine dose was reduced by 50% due to neuropathy from cycle 4 onwards   06/25/2015 - 07/06/2015 Hospital Admission   She was hospitalized for recent sepsis/bacteremia and a fib with RVR   07/11/2015 Imaging   repeat PEt scan showed complete response to Rx   07/14/2015 - 06/16/2017 Chemotherapy   She received maintenance Rituximab every 60 days   12/15/2015 Imaging   PET CT showed no evidence of cancer recurrence   06/15/2016 PET scan   No evidence of active lymphoma on skullbase to thigh FDG PET scan. Small LEFT periaortic retroperitoneal lymph nodes without significant metabolic activity ( Deauville 1). No change from prior   03/24/2017 Imaging   1. Borderline enlarged abdominal retroperitoneal lymph nodes, stable. No new adenopathy in the chest, abdomen or pelvis. 2. Aortic atherosclerosis (ICD10-170.0). 3. Enlarged pulmonary arteries, indicative of pulmonary arterial hypertension.   09/14/2017 PET scan   Stable exam. No evidence of active lymphoma or other acute findings. (Deauville score 1)   08/08/2018 PET scan   1. Evidence of progressive/recurrent disease, as evidenced by new hypermetabolic  nodes and subcutaneous nodule/nodes throughout left chest and abdomen. (Deauville 5). 2. Likely physiologic right nasopharyngeal hypermetabolism. Recommend attention on follow-up. 3. Incidental findings, including pulmonary artery enlargement, suggesting  pulmonary arterial hypertension, stable left lower lobe pulmonary nodule, and aortic Atherosclerosis (ICD10-I70.0).   08/29/2018 Initial Biopsy   Soft tissue simple excision left back: Follicular lymphoma grade 1-2 positive for CD20, PAX5, bcl-6, and bcl-2.  Ki-67 30%   09/11/2018 - 02/20/2019 Chemotherapy   The patient had Bendamustine and Rituximab   12/13/2018 Imaging   1. Response to therapy, as evidenced by resolution of left chest wall nodularity and decrease in size of small left axillary nodes. 2. No residual soft tissue thickening at the site of hypermetabolism along the posterior left eleventh rib. Resolution of adjacent subcutaneous nodularity. 3. No new or progressive disease identified. 4.  Aortic Atherosclerosis (ICD10-I70.0). 5. Bilateral pulmonary nodules, similar. 6. Trace air within the urinary bladder could be iatrogenic. Possible pericystic edema. Correlate with symptoms of cystitis and recent instrumentation.   07/30/2019 Imaging   Ct chest, abdomen and pelvis No findings suspicious for active lymphoma in the chest, abdomen, or pelvis.   Small bilateral pulmonary nodules, unchanged, benign.   Again noted is trace nondependent gas within the bladder. Correlate for recent intervention.     REVIEW OF SYSTEMS:   Constitutional: Denies fevers, chills or abnormal weight loss Eyes: Denies blurriness of vision Ears, nose, mouth, throat, and face: Denies mucositis or sore throat Respiratory: Denies cough, dyspnea or wheezes Cardiovascular: Denies palpitation, chest discomfort or lower extremity swelling Gastrointestinal:  Denies nausea, heartburn or change in bowel habits Skin: Denies abnormal skin rashes Lymphatics: Denies new lymphadenopathy or easy bruising Neurological:Denies numbness, tingling or new weaknesses Behavioral/Psych: Mood is stable, no new changes  All other systems were reviewed with the patient and are negative.  I have reviewed the past medical  history, past surgical history, social history and family history with the patient and they are unchanged from previous note.  ALLERGIES:  is allergic to amiodarone.  MEDICATIONS:  Current Outpatient Medications  Medication Sig Dispense Refill  . acetaminophen (TYLENOL) 500 MG tablet Take 500 mg by mouth every 6 (six) hours as needed for moderate pain or headache.    Marland Kitchen apixaban (ELIQUIS) 5 MG TABS tablet Take 5 mg by mouth 2 (two) times daily.     . bisacodyl (DULCOLAX) 5 MG EC tablet Take 5 mg by mouth daily as needed for moderate constipation.    . diclofenac sodium (VOLTAREN) 1 % GEL Apply 2 g topically 3 (three) times daily as needed (knee pain). 100 g 0  . levothyroxine (SYNTHROID, LEVOTHROID) 112 MCG tablet Take 112 mcg by mouth daily.   9  . lidocaine-prilocaine (EMLA) cream Apply to affected area once 30 g 3  . metoprolol succinate (TOPROL-XL) 25 MG 24 hr tablet Take 0.5 tablets (12.5 mg total) by mouth daily. Take with or immediately following a meal. 90 tablet 3  . polyvinyl alcohol (LIQUIFILM TEARS) 1.4 % ophthalmic solution Place 1 drop into both eyes as needed for dry eyes.     No current facility-administered medications for this visit.    Facility-Administered Medications Ordered in Other Visits  Medication Dose Route Frequency Provider Last Rate Last Dose  . ondansetron (ZOFRAN) 8 mg in sodium chloride 0.9 % 50 mL IVPB   Intravenous Once Jeyden Coffelt, MD      . sodium chloride 0.9 % injection 10 mL  10 mL Intravenous PRN [name], MD  10 mL at 04/05/17 1315    PHYSICAL EXAMINATION: ECOG PERFORMANCE STATUS: 1 - Symptomatic but completely ambulatory  Vitals:   09/24/19 1109  BP: (!) 150/81  Pulse: 61  Resp: 18  Temp: 98 F (36.7 C)  SpO2: 96%   Filed Weights   09/24/19 1109  Weight: 139 lb 14.4 oz (63.5 kg)    GENERAL:alert, no distress and comfortable SKIN: skin color, texture, turgor are normal, no rashes or significant lesions EYES: normal, Conjunctiva  are pink and non-injected, sclera clear OROPHARYNX:no exudate, no erythema and lips, buccal mucosa, and tongue normal  NECK: supple, thyroid normal size, non-tender, without nodularity LYMPH:  no palpable lymphadenopathy in the cervical, axillary or inguinal LUNGS: clear to auscultation and percussion with normal breathing effort HEART: regular rate & rhythm and no murmurs and no lower extremity edema ABDOMEN:abdomen soft, non-tender and normal bowel sounds Musculoskeletal:no cyanosis of digits and no clubbing  NEURO: alert & oriented x 3 with fluent speech, no focal motor/sensory deficits  LABORATORY DATA:  I have reviewed the data as listed    Component Value Date/Time   NA 141 09/24/2019 1050   NA 143 07/16/2019 1132   NA 142 08/29/2017 0838   K 4.4 09/24/2019 1050   K 4.2 08/29/2017 0838   CL 107 09/24/2019 1050   CO2 25 09/24/2019 1050   CO2 23 08/29/2017 0838   GLUCOSE 89 09/24/2019 1050   GLUCOSE 97 08/29/2017 0838   BUN 24 (H) 09/24/2019 1050   BUN 27 07/16/2019 1132   BUN 24.0 08/29/2017 0838   CREATININE 1.15 (H) 09/24/2019 1050   CREATININE 1.51 (H) 07/30/2019 1010   CREATININE 1.0 08/29/2017 0838   CALCIUM 8.7 (L) 09/24/2019 1050   CALCIUM 8.9 08/29/2017 0838   PROT 6.2 (L) 09/24/2019 1050   PROT 6.4 08/29/2017 0838   ALBUMIN 3.8 09/24/2019 1050   ALBUMIN 3.8 08/29/2017 0838   AST 15 09/24/2019 1050   AST 13 (L) 07/30/2019 1010   AST 18 08/29/2017 0838   ALT 11 09/24/2019 1050   ALT 9 07/30/2019 1010   ALT 16 08/29/2017 0838   ALKPHOS 93 09/24/2019 1050   ALKPHOS 93 08/29/2017 0838   BILITOT 0.5 09/24/2019 1050   BILITOT 0.5 07/30/2019 1010   BILITOT 0.33 08/29/2017 0838   GFRNONAA 46 (L) 09/24/2019 1050   GFRNONAA 33 (L) 07/30/2019 1010   GFRAA 54 (L) 09/24/2019 1050   GFRAA 39 (L) 07/30/2019 1010    No results found for: SPEP, UPEP  Lab Results  Component Value Date   WBC 2.3 (L) 09/24/2019   NEUTROABS 1.5 (L) 09/24/2019   HGB 12.5 09/24/2019    HCT 39.3 09/24/2019   MCV 94.9 09/24/2019   PLT 193 09/24/2019      Chemistry      Component Value Date/Time   NA 141 09/24/2019 1050   NA 143 07/16/2019 1132   NA 142 08/29/2017 0838   K 4.4 09/24/2019 1050   K 4.2 08/29/2017 0838   CL 107 09/24/2019 1050   CO2 25 09/24/2019 1050   CO2 23 08/29/2017 0838   BUN 24 (H) 09/24/2019 1050   BUN 27 07/16/2019 1132   BUN 24.0 08/29/2017 0838   CREATININE 1.15 (H) 09/24/2019 1050   CREATININE 1.51 (H) 07/30/2019 1010   CREATININE 1.0 08/29/2017 0838      Component Value Date/Time   CALCIUM 8.7 (L) 09/24/2019 1050   CALCIUM 8.9 08/29/2017 0838   ALKPHOS 93 09/24/2019 1050  ALKPHOS 93 08/29/2017 0838   AST 15 09/24/2019 1050   AST 13 (L) 07/30/2019 1010   AST 18 08/29/2017 0838   ALT 11 09/24/2019 1050   ALT 9 07/30/2019 1010   ALT 16 08/29/2017 0838   BILITOT 0.5 09/24/2019 1050   BILITOT 0.5 07/30/2019 1010   BILITOT 0.33 08/29/2017 0838       All questions were answered. The patient knows to call the clinic with any problems, questions or concerns. No barriers to learning was detected.  I spent 15 minutes counseling the patient face to face. The total time spent in the appointment was 20 minutes and more than 50% was on counseling and review of test results  [name], MD 09/25/2019 8:34 AM

## 2019-09-25 NOTE — Assessment & Plan Note (Signed)
She is not symptomatic We discussed neutropenic precaution She is up-to-date with influenza vaccination It could be related to previous side effects of treatment I plan to order serum vitamin B12 in her next visit

## 2019-09-25 NOTE — Assessment & Plan Note (Signed)
Her blood work and recent imaging studies showed no evidence of cancer recurrence Her examination is benign She is at high risk of recurrent cancer given multiple relapse I will see her again in 2 months for blood work, port flush maintenance as well as examination 

## 2019-10-31 ENCOUNTER — Other Ambulatory Visit: Payer: Self-pay | Admitting: Hematology and Oncology

## 2019-11-01 ENCOUNTER — Telehealth: Payer: Self-pay | Admitting: Cardiology

## 2019-11-01 ENCOUNTER — Telehealth: Payer: Self-pay | Admitting: *Deleted

## 2019-11-01 NOTE — Telephone Encounter (Signed)
Patient calling to inquire about the COVID vaccine. Should she get this done next week?

## 2019-11-01 NOTE — Telephone Encounter (Signed)
New Message      We are recommending the COVID-19 vaccine to all of our patients. Cardiac medications (including blood thinners) should not deter anyone from being vaccinated and there is no need to hold any of those medications prior to vaccine administration.     Currently, there is a hotline to call (active 10/26/19) to schedule vaccination appointments as no walk-ins will be accepted.   Number: [phone]    If you have further questions or concerns about the vaccine process, please visit [email or web address] or contact your primary care physician.         

## 2019-11-01 NOTE — Telephone Encounter (Signed)
Telephone call returned to patient to advise she is able to receive the vaccine. Message left for a return call with any further questions.

## 2019-11-01 NOTE — Telephone Encounter (Signed)
Since she is off treatment now she can have the vaccine

## 2019-11-15 IMAGING — CT CT HEAD WITHOUT CONTRAST
3 series · 15 of 45 positions shown, 18 images · non-contrast
Comparison: October 18, 2013

CLINICAL DATA: Headache and weakness for 2 weeks

EXAM:
CT HEAD WITHOUT CONTRAST
TECHNIQUE: Contiguous axial images were obtained from the base of the skull
through the vertex without intravenous contrast.

[Series 2: head wo · axial · 0.41mm/px · z∈[+1614,+1729]mm · 9 of 28 slices shown, 12 images]
[im 3/28  brain]
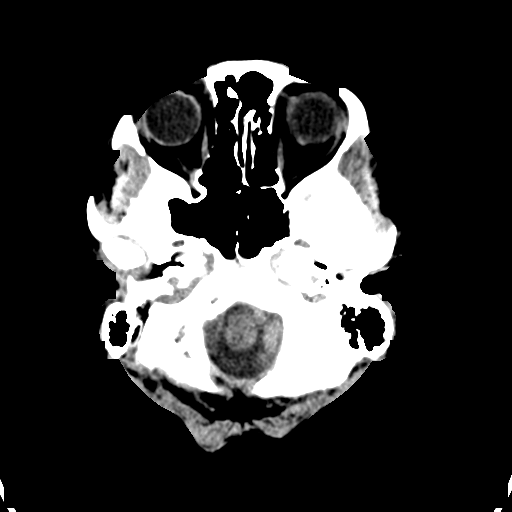
[im 3/28  bone]
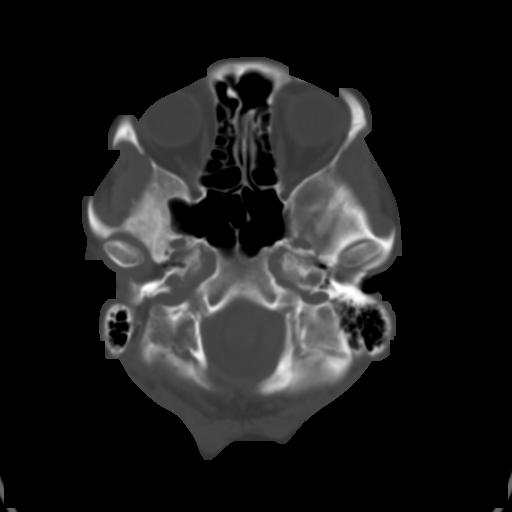
[im 6/28  brain]
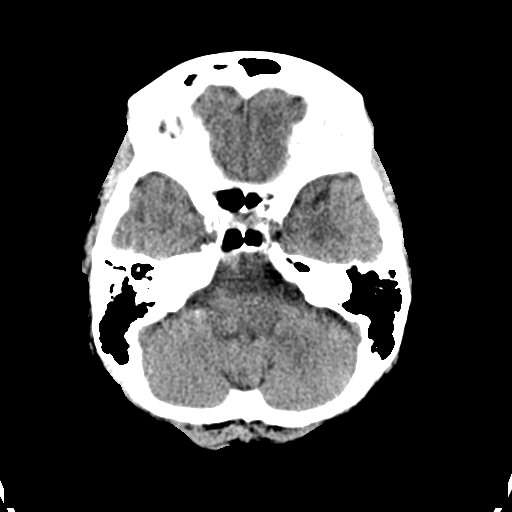
[im 9/28  brain]
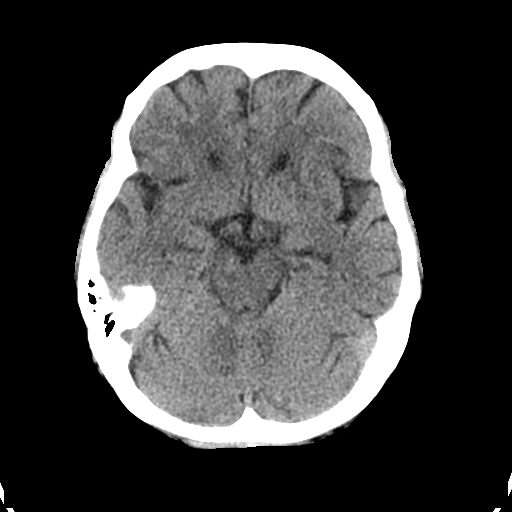
[im 12/28  brain]
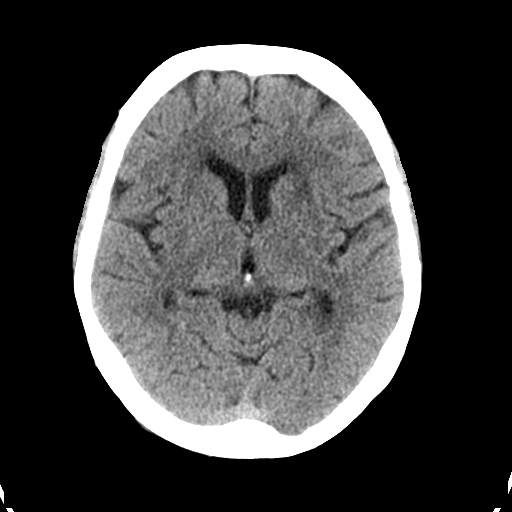
[im 15/28  brain]
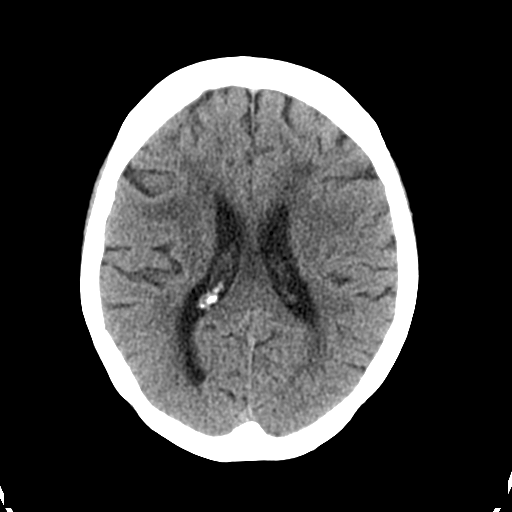
[im 15/28  bone]
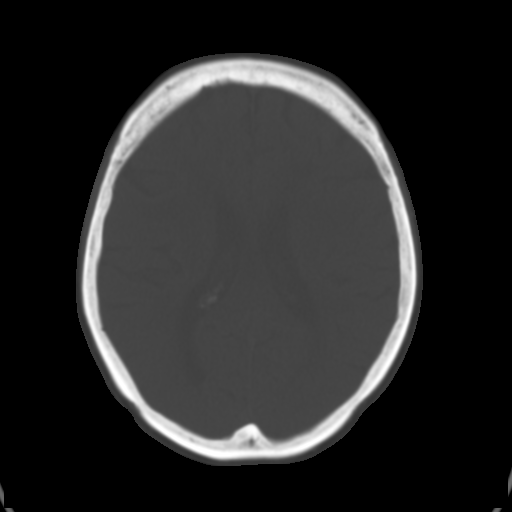
[im 17/28  brain]
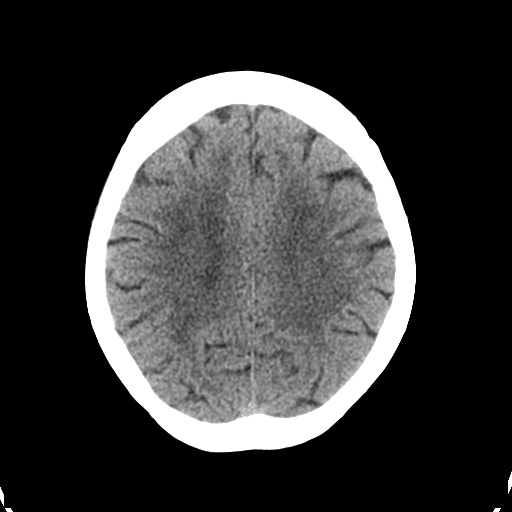
[im 20/28  brain]
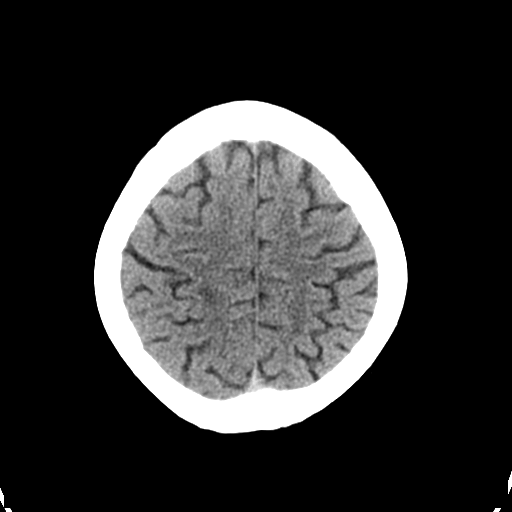
[im 23/28  brain]
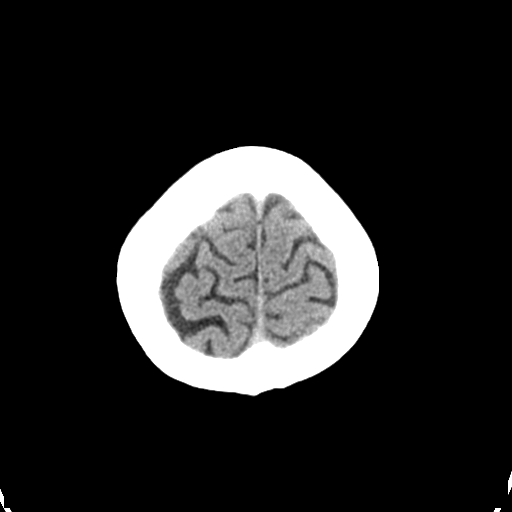
[im 26/28  brain]
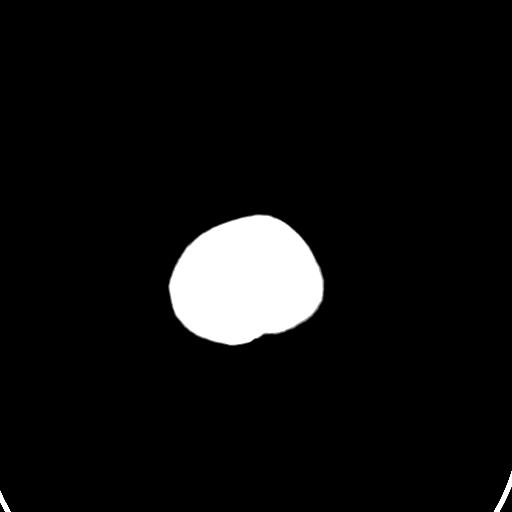
[im 26/28  bone]
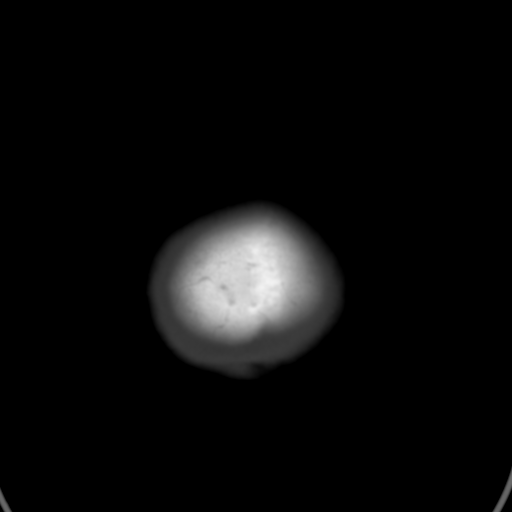

[Series 5: coronal soft tissue · coronal · 0.27mm/px · 3 of 64 slices shown]
[im 22/64  brain]
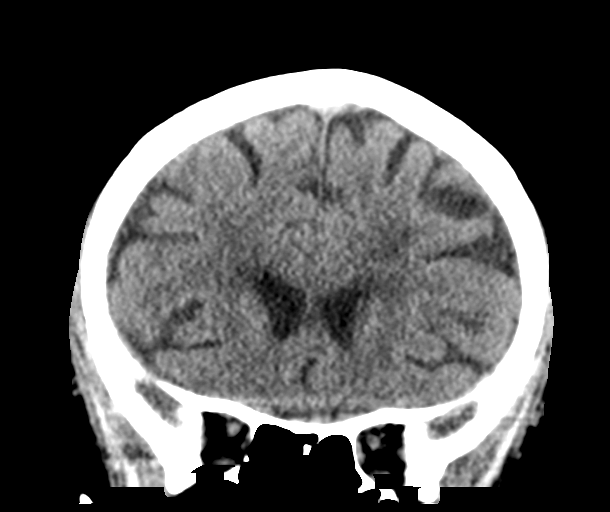
[im 29/64  brain]
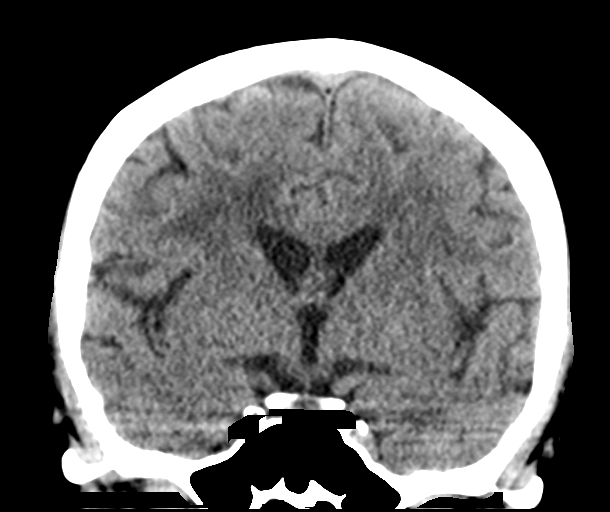
[im 36/64  brain]
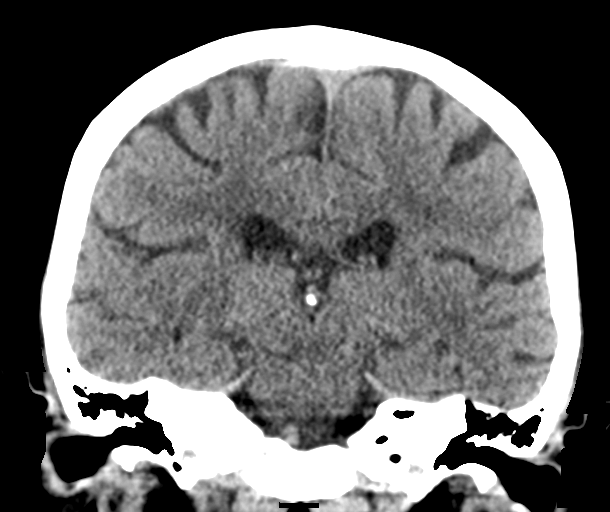

[Series 6: sagittal soft tissue · sagittal · 0.27mm/px · 3 of 51 slices shown]
[im 17/51  brain]
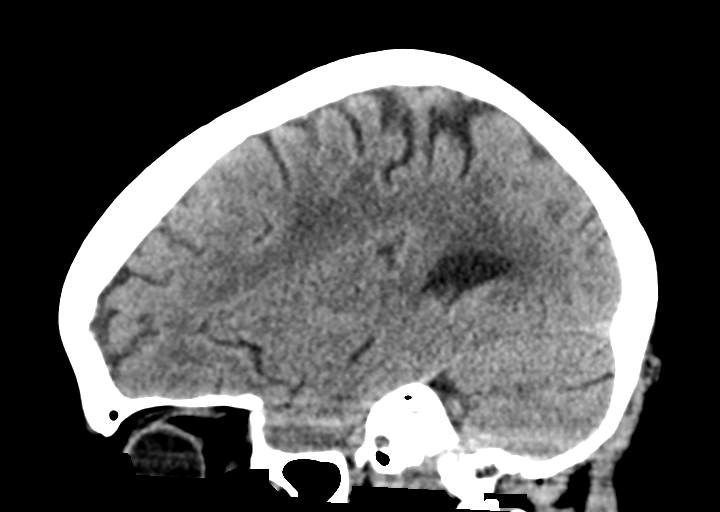
[im 26/51  brain]
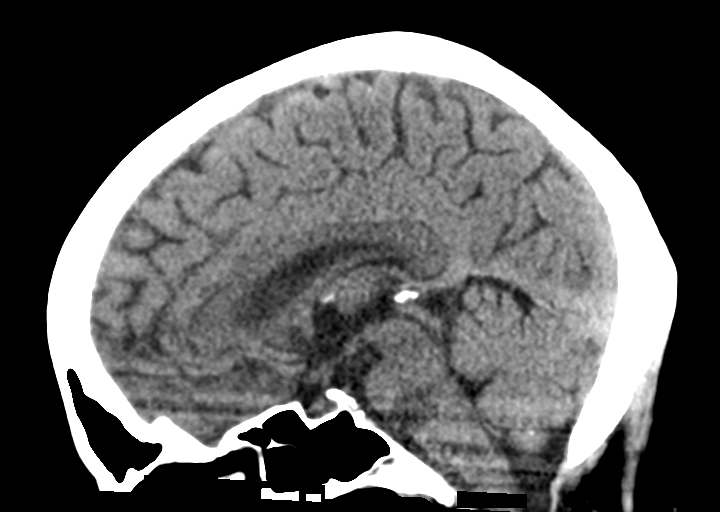
[im 34/51  brain]
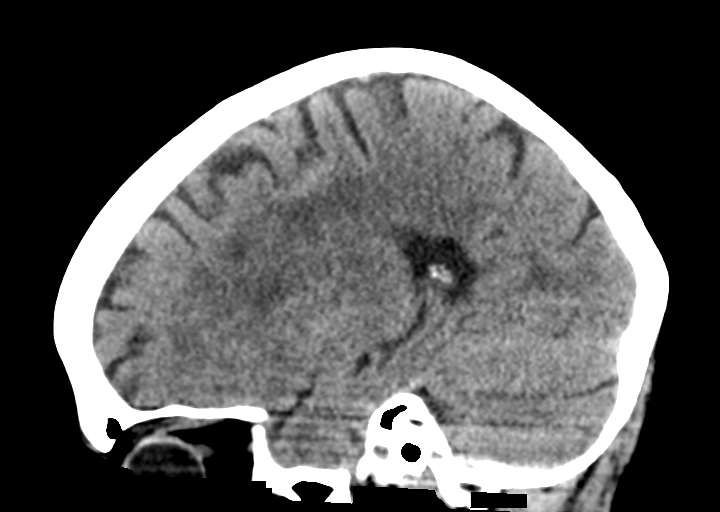

[15 of 45 positions shown; findings below may reference images not displayed]

FINDINGS: Brain: No subdural, epidural, or subarachnoid hemorrhage. Ventricles
and sulci are unremarkable. Cerebellum, brainstem, and basal
cisterns are normal. No mass effect or midline shift. Scattered
white matter changes have increased since 4602 but remain moderate.
No acute cortical ischemia or infarct.

Vascular: Atherosclerotic changes are seen in the intracranial
carotids.

Skull: Normal. Negative for fracture or focal lesion.

Sinuses/Orbits: No acute finding.

Other: None.
IMPRESSION: 1. Increasing chronic white matter changes compared to 4602. No
acute intracranial abnormalities are noted.

## 2019-11-19 ENCOUNTER — Inpatient Hospital Stay: Payer: Medicare HMO

## 2019-11-19 ENCOUNTER — Other Ambulatory Visit: Payer: Self-pay

## 2019-11-19 ENCOUNTER — Inpatient Hospital Stay: Payer: Medicare HMO | Attending: Hematology and Oncology | Admitting: Hematology and Oncology

## 2019-11-19 ENCOUNTER — Encounter: Payer: Self-pay | Admitting: Hematology and Oncology

## 2019-11-19 DIAGNOSIS — Z452 Encounter for adjustment and management of vascular access device: Secondary | ICD-10-CM | POA: Insufficient documentation

## 2019-11-19 DIAGNOSIS — Z79899 Other long term (current) drug therapy: Secondary | ICD-10-CM | POA: Diagnosis not present

## 2019-11-19 DIAGNOSIS — C8238 Follicular lymphoma grade IIIa, lymph nodes of multiple sites: Secondary | ICD-10-CM

## 2019-11-19 DIAGNOSIS — Z7901 Long term (current) use of anticoagulants: Secondary | ICD-10-CM | POA: Diagnosis not present

## 2019-11-19 DIAGNOSIS — D72819 Decreased white blood cell count, unspecified: Secondary | ICD-10-CM | POA: Diagnosis not present

## 2019-11-19 DIAGNOSIS — Z791 Long term (current) use of non-steroidal anti-inflammatories (NSAID): Secondary | ICD-10-CM | POA: Diagnosis not present

## 2019-11-19 DIAGNOSIS — M25569 Pain in unspecified knee: Secondary | ICD-10-CM | POA: Insufficient documentation

## 2019-11-19 DIAGNOSIS — Z9221 Personal history of antineoplastic chemotherapy: Secondary | ICD-10-CM | POA: Diagnosis not present

## 2019-11-19 LAB — CBC WITH DIFFERENTIAL/PLATELET
Abs Immature Granulocytes: 0.01 10*3/uL (ref 0.00–0.07)
Basophils Absolute: 0 10*3/uL (ref 0.0–0.1)
Basophils Relative: 1 %
Eosinophils Absolute: 0.1 10*3/uL (ref 0.0–0.5)
Eosinophils Relative: 6 %
HCT: 42.5 % (ref 36.0–46.0)
Hemoglobin: 13.6 g/dL (ref 12.0–15.0)
Immature Granulocytes: 0 %
Lymphocytes Relative: 11 %
Lymphs Abs: 0.3 10*3/uL — ABNORMAL LOW (ref 0.7–4.0)
MCH: 29.5 pg (ref 26.0–34.0)
MCHC: 32 g/dL (ref 30.0–36.0)
MCV: 92.2 fL (ref 80.0–100.0)
Monocytes Absolute: 0.4 10*3/uL (ref 0.1–1.0)
Monocytes Relative: 19 %
Neutro Abs: 1.4 10*3/uL — ABNORMAL LOW (ref 1.7–7.7)
Neutrophils Relative %: 63 %
Platelets: 190 10*3/uL (ref 150–400)
RBC: 4.61 MIL/uL (ref 3.87–5.11)
RDW: 13.2 % (ref 11.5–15.5)
WBC: 2.3 10*3/uL — ABNORMAL LOW (ref 4.0–10.5)
nRBC: 0 % (ref 0.0–0.2)

## 2019-11-19 LAB — COMPREHENSIVE METABOLIC PANEL
ALT: 12 U/L (ref 0–44)
AST: 13 U/L — ABNORMAL LOW (ref 15–41)
Albumin: 4.2 g/dL (ref 3.5–5.0)
Alkaline Phosphatase: 85 U/L (ref 38–126)
Anion gap: 10 (ref 5–15)
BUN: 25 mg/dL — ABNORMAL HIGH (ref 8–23)
CO2: 27 mmol/L (ref 22–32)
Calcium: 9.2 mg/dL (ref 8.9–10.3)
Chloride: 106 mmol/L (ref 98–111)
Creatinine, Ser: 1.24 mg/dL — ABNORMAL HIGH (ref 0.44–1.00)
GFR calc Af Amer: 49 mL/min — ABNORMAL LOW (ref >60)
GFR calc non Af Amer: 42 mL/min — ABNORMAL LOW (ref >60)
Glucose, Bld: 97 mg/dL (ref 70–99)
Potassium: 4.2 mmol/L (ref 3.5–5.1)
Sodium: 143 mmol/L (ref 135–145)
Total Bilirubin: 0.6 mg/dL (ref 0.3–1.2)
Total Protein: 6.5 g/dL (ref 6.5–8.1)

## 2019-11-19 LAB — VITAMIN B12: Vitamin B-12: 341 pg/mL (ref 180–914)

## 2019-11-19 MED ORDER — HEPARIN SOD (PORK) LOCK FLUSH 100 UNIT/ML IV SOLN
500.0000 [IU] | Freq: Once | INTRAVENOUS | Status: AC
  Administered 2019-11-19: 500 [IU]
  Filled 2019-11-19: qty 5

## 2019-11-19 MED ORDER — SODIUM CHLORIDE 0.9% FLUSH
10.0000 mL | Freq: Once | INTRAVENOUS | Status: AC
  Administered 2019-11-19: 10 mL
  Filled 2019-11-19: qty 10

## 2019-11-19 NOTE — Assessment & Plan Note (Signed)
She continues to have persistent neutropenia Serum vitamin B12 is pending I will call her with test results In the meantime, we discussed neutropenic precaution

## 2019-11-19 NOTE — Patient Instructions (Signed)

## 2019-11-19 NOTE — Progress Notes (Signed)
[place] OFFICE PROGRESS NOTE  Patient Care Team: Domenick Bookbinder, MD as PCP - General (Internal Medicine) [name], MD as PCP - Cardiology (Cardiology) Carola Frost, RN as Registered Nurse (Medical Oncology) [name], MD as Attending Physician (Internal Medicine)  ASSESSMENT & PLAN:  Grade 3a follicular lymphoma of lymph nodes of multiple regions [facility]) Her blood work and recent imaging studies showed no evidence of cancer recurrence Her examination is benign She is at high risk of recurrent cancer given multiple relapse I will see her again in 2 months for blood work, port flush maintenance as well as examination  Chronic leukopenia She continues to have persistent neutropenia Serum vitamin B12 is pending I will call her with test results In the meantime, we discussed neutropenic precaution   No orders of the defined types were placed in this encounter.   All questions were answered. The patient knows to call the clinic with any problems, questions or concerns. The total time spent in the appointment was 15 minutes encounter with patients including review of chart and various tests results, discussions about plan of care and coordination of care plan   [name], MD 11/19/2019 11:36 AM  INTERVAL HISTORY: Please see below for problem oriented charting. She returns for further follow-up for lymphoma No new lymphadenopathy Denies recent infection, fever or chills No bleeding She has occasional knee pain that comes and goes  SUMMARY OF ONCOLOGIC HISTORY: Oncology History  Grade 3a follicular lymphoma of lymph nodes of multiple regions ([place])  01/21/2015 Surgery   She underwent excisional lymph node biopsy that came back follicular lymphoma grade 3   01/21/2015 Pathology Results   Accession: [id number] biopsy confirmed follicular lymphoma   01/24/8118 Imaging   Echocardiogram showed ejection fraction of 55-60%   02/11/2015 Imaging    PET CT  scan show possible splenic involvement and diffuse lymphadenopathy throughout   02/11/2015 Bone Marrow Biopsy    bone marrow biopsy was performed and is involved by lymphoma with translocation of igH/BCL2   02/13/2015 Procedure   She had port placement.   02/17/2015 - 06/02/2015 Chemotherapy   She received R-CHOP chemo x 6   02/17/2015 Adverse Reaction   She had mild infusion reaction with cycle 1 of treatment.   04/18/2015 Imaging   PET CT scan showed near complete response to treatment.   04/22/2015 Adverse Reaction   Vincristine dose was reduced by 50% due to neuropathy from cycle 4 onwards   06/25/2015 - 07/06/2015 Hospital Admission   She was hospitalized for recent sepsis/bacteremia and a fib with RVR   07/11/2015 Imaging   repeat PEt scan showed complete response to Rx   07/14/2015 - 06/16/2017 Chemotherapy   She received maintenance Rituximab every 60 days   12/15/2015 Imaging   PET CT showed no evidence of cancer recurrence   06/15/2016 PET scan   No evidence of active lymphoma on skullbase to thigh FDG PET scan. Small LEFT periaortic retroperitoneal lymph nodes without significant metabolic activity ( Deauville 1). No change from prior   03/24/2017 Imaging   1. Borderline enlarged abdominal retroperitoneal lymph nodes, stable. No new adenopathy in the chest, abdomen or pelvis. 2. Aortic atherosclerosis (ICD10-170.0). 3. Enlarged pulmonary arteries, indicative of pulmonary arterial hypertension.   09/14/2017 PET scan   Stable exam. No evidence of active lymphoma or other acute findings. (Deauville score 1)   08/08/2018 PET scan   1. Evidence of progressive/recurrent disease, as evidenced by new hypermetabolic nodes and subcutaneous  nodule/nodes throughout left chest and abdomen. (Deauville 5). 2. Likely physiologic right nasopharyngeal hypermetabolism. Recommend attention on follow-up. 3. Incidental findings, including pulmonary artery enlargement, suggesting pulmonary arterial  hypertension, stable left lower lobe pulmonary nodule, and aortic Atherosclerosis (ICD10-I70.0).   08/29/2018 Initial Biopsy   Soft tissue simple excision left back: Follicular lymphoma grade 1-2 positive for CD20, PAX5, bcl-6, and bcl-2.  Ki-67 30%   09/11/2018 - 02/20/2019 Chemotherapy   The patient had Bendamustine and Rituximab   12/13/2018 Imaging   1. Response to therapy, as evidenced by resolution of left chest wall nodularity and decrease in size of small left axillary nodes. 2. No residual soft tissue thickening at the site of hypermetabolism along the posterior left eleventh rib. Resolution of adjacent subcutaneous nodularity. 3. No new or progressive disease identified. 4.  Aortic Atherosclerosis (ICD10-I70.0). 5. Bilateral pulmonary nodules, similar. 6. Trace air within the urinary bladder could be iatrogenic. Possible pericystic edema. Correlate with symptoms of cystitis and recent instrumentation.   07/30/2019 Imaging   Ct chest, abdomen and pelvis No findings suspicious for active lymphoma in the chest, abdomen, or pelvis.   Small bilateral pulmonary nodules, unchanged, benign.   Again noted is trace nondependent gas within the bladder. Correlate for recent intervention.     REVIEW OF SYSTEMS:   Constitutional: Denies fevers, chills or abnormal weight loss Eyes: Denies blurriness of vision Ears, nose, mouth, throat, and face: Denies mucositis or sore throat Respiratory: Denies cough, dyspnea or wheezes Cardiovascular: Denies palpitation, chest discomfort or lower extremity swelling Gastrointestinal:  Denies nausea, heartburn or change in bowel habits Skin: Denies abnormal skin rashes Lymphatics: Denies new lymphadenopathy or easy bruising Neurological:Denies numbness, tingling or new weaknesses Behavioral/Psych: Mood is stable, no new changes  All other systems were reviewed with the patient and are negative.  I have reviewed the past medical history, past surgical  history, social history and family history with the patient and they are unchanged from previous note.  ALLERGIES:  is allergic to amiodarone.  MEDICATIONS:  Current Outpatient Medications  Medication Sig Dispense Refill  . acetaminophen (TYLENOL) 500 MG tablet Take 500 mg by mouth every 6 (six) hours as needed for moderate pain or headache.    Marland Kitchen apixaban (ELIQUIS) 5 MG TABS tablet Take 5 mg by mouth 2 (two) times daily.     . bisacodyl (DULCOLAX) 5 MG EC tablet Take 5 mg by mouth daily as needed for moderate constipation.    . diclofenac sodium (VOLTAREN) 1 % GEL Apply 2 g topically 3 (three) times daily as needed (knee pain). 100 g 0  . levothyroxine (SYNTHROID, LEVOTHROID) 112 MCG tablet Take 112 mcg by mouth daily.   9  . lidocaine-prilocaine (EMLA) cream Apply to affected area once 30 g 3  . metoprolol succinate (TOPROL-XL) 25 MG 24 hr tablet Take 0.5 tablets (12.5 mg total) by mouth daily. Take with or immediately following a meal. 90 tablet 3  . polyvinyl alcohol (LIQUIFILM TEARS) 1.4 % ophthalmic solution Place 1 drop into both eyes as needed for dry eyes.     No current facility-administered medications for this visit.   Facility-Administered Medications Ordered in Other Visits  Medication Dose Route Frequency Provider Last Rate Last Admin  . ondansetron (ZOFRAN) 8 mg in sodium chloride 0.9 % 50 mL IVPB   Intravenous Once Tyde Lamison, MD      . sodium chloride 0.9 % injection 10 mL  10 mL Intravenous PRN Alvy Bimler, Zian Mohamed, MD   10 mL at  04/05/17 1315    PHYSICAL EXAMINATION: ECOG PERFORMANCE STATUS: 0 - Asymptomatic  Vitals:   11/19/19 1104  BP: 119/73  Pulse: 71  Resp: 18  Temp: 98.2 F (36.8 C)  SpO2: 99%   Filed Weights   11/19/19 1104  Weight: 140 lb 3.2 oz (63.6 kg)    GENERAL:alert, no distress and comfortable SKIN: skin color, texture, turgor are normal, no rashes or significant lesions EYES: normal, Conjunctiva are pink and non-injected, sclera  clear OROPHARYNX:no exudate, no erythema and lips, buccal mucosa, and tongue normal  NECK: supple, thyroid normal size, non-tender, without nodularity LYMPH:  no palpable lymphadenopathy in the cervical, axillary or inguinal LUNGS: clear to auscultation and percussion with normal breathing effort HEART: regular rate & rhythm and no murmurs and no lower extremity edema ABDOMEN:abdomen soft, non-tender and normal bowel sounds Musculoskeletal:no cyanosis of digits and no clubbing  NEURO: alert & oriented x 3 with fluent speech, no focal motor/sensory deficits  LABORATORY DATA:  I have reviewed the data as listed    Component Value Date/Time   NA 143 11/19/2019 1052   NA 143 07/16/2019 1132   NA 142 08/29/2017 0838   K 4.2 11/19/2019 1052   K 4.2 08/29/2017 0838   CL 106 11/19/2019 1052   CO2 27 11/19/2019 1052   CO2 23 08/29/2017 0838   GLUCOSE 97 11/19/2019 1052   GLUCOSE 97 08/29/2017 0838   BUN 25 (H) 11/19/2019 1052   BUN 27 07/16/2019 1132   BUN 24.0 08/29/2017 0838   CREATININE 1.24 (H) 11/19/2019 1052   CREATININE 1.51 (H) 07/30/2019 1010   CREATININE 1.0 08/29/2017 0838   CALCIUM 9.2 11/19/2019 1052   CALCIUM 8.9 08/29/2017 0838   PROT 6.5 11/19/2019 1052   PROT 6.4 08/29/2017 0838   ALBUMIN 4.2 11/19/2019 1052   ALBUMIN 3.8 08/29/2017 0838   AST 13 (L) 11/19/2019 1052   AST 13 (L) 07/30/2019 1010   AST 18 08/29/2017 0838   ALT 12 11/19/2019 1052   ALT 9 07/30/2019 1010   ALT 16 08/29/2017 0838   ALKPHOS 85 11/19/2019 1052   ALKPHOS 93 08/29/2017 0838   BILITOT 0.6 11/19/2019 1052   BILITOT 0.5 07/30/2019 1010   BILITOT 0.33 08/29/2017 0838   GFRNONAA 42 (L) 11/19/2019 1052   GFRNONAA 33 (L) 07/30/2019 1010   GFRAA 49 (L) 11/19/2019 1052   GFRAA 39 (L) 07/30/2019 1010    No results found for: SPEP, UPEP  Lab Results  Component Value Date   WBC 2.3 (L) 11/19/2019   NEUTROABS 1.4 (L) 11/19/2019   HGB 13.6 11/19/2019   HCT 42.5 11/19/2019   MCV 92.2  11/19/2019   PLT 190 11/19/2019      Chemistry      Component Value Date/Time   NA 143 11/19/2019 1052   NA 143 07/16/2019 1132   NA 142 08/29/2017 0838   K 4.2 11/19/2019 1052   K 4.2 08/29/2017 0838   CL 106 11/19/2019 1052   CO2 27 11/19/2019 1052   CO2 23 08/29/2017 0838   BUN 25 (H) 11/19/2019 1052   BUN 27 07/16/2019 1132   BUN 24.0 08/29/2017 0838   CREATININE 1.24 (H) 11/19/2019 1052   CREATININE 1.51 (H) 07/30/2019 1010   CREATININE 1.0 08/29/2017 0838      Component Value Date/Time   CALCIUM 9.2 11/19/2019 1052   CALCIUM 8.9 08/29/2017 0838   ALKPHOS 85 11/19/2019 1052   ALKPHOS 93 08/29/2017 0838   AST 13 (L)  11/19/2019 1052   AST 13 (L) 07/30/2019 1010   AST 18 08/29/2017 0838   ALT 12 11/19/2019 1052   ALT 9 07/30/2019 1010   ALT 16 08/29/2017 0838   BILITOT 0.6 11/19/2019 1052   BILITOT 0.5 07/30/2019 1010   BILITOT 0.33 08/29/2017 3710

## 2019-11-19 NOTE — Assessment & Plan Note (Signed)
Her blood work and recent imaging studies showed no evidence of cancer recurrence Her examination is benign She is at high risk of recurrent cancer given multiple relapse I will see her again in 2 months for blood work, port flush maintenance as well as examination 

## 2019-11-20 ENCOUNTER — Telehealth: Payer: Self-pay | Admitting: *Deleted

## 2019-11-20 NOTE — Telephone Encounter (Signed)
-----   Message from [name], MD sent at 11/20/2019  7:44 AM EST ----- Regarding: vitamin b12 Please let her know B12 level is ok The cause of low WBC is likely due to previous treatment, nothing else to do for now

## 2019-11-20 NOTE — Telephone Encounter (Signed)
Telephone call to patient to advise lab results as directed below. Patient appreciated call. 

## 2019-11-23 ENCOUNTER — Telehealth: Payer: Self-pay | Admitting: Hematology and Oncology

## 2019-11-23 NOTE — Telephone Encounter (Signed)
Scheduled per 2/1 sch msg. Called and spoke with pt, confirmed 3/29 appt

## 2019-11-27 NOTE — Progress Notes (Signed)
11/28/2019 Carrie [name] Carrie [name]   June 04, 1941  [id number]  Primary Physician Domenick Bookbinder, MD Primary Cardiologist: Ena Dawley, MD  Electrophysiologist: None   Reason for Visit/CC: 1 month f/u for Tachy mediated Cardiomyopathy  HPI:  She has aPMH of HTN, PAF (s/p recent cardioversion 04/17/2457), follicular lymphoma s/p chemo (last tx 01/25/2019), hypothyroidism, CKD stage 3and recent diagnosis of systolic HF. Of note, she had a NST in 2017 that was negative for ischemia and echo showed normal EFat that time.   On Feb 16, 2019, she presented to the ED w/ dyspnea and fatigue and was found to be in rapid atrial fibrillation andshe required DCCV and was placed on amiodarone. Echo was done and showed significantly reduced LVEF, down to 20-25% (previously normal). This was felt either tachy mediated from rapid afib vs chemo induced. No CP and no plans for ischemic w/u. She did require diuretics for volume overload. Was initially treated w/ IV Lasix then transitioned to PO. She was also placed on guidelines directed medical therapy w/ Coreg and Entresto.   She was readmitted again 5/17 for near syncope and was found to be orthostatic. Her Coreg and Delene Loll were initially held. Lasix discontinued. Pt monitored. Coreg and Entresto added back and BP normalized and symptoms resolved. Afterwards, she still has issues with soft BP and  blocker was changed from Coreg was to metoprolol. She tolerated this better.   04/25/19, she was seen by Roby Lofts, PA-C and was doing well w/o symptoms and tolerating medications ok. EKG showed NSR. Repeat echo was ordered and showed improved LVEF back to normal at 55-60%.  07/03/2019 - 4 weeks follow up, she was seen on 06/07/2019 when she was doing well, however had labs drawn in [place] with worsening Crea and was instructed to hold Entresto. Her most recent echo on 05/22/2019 showed normalization of LVEF, she remains asymptomatic, denies DOE, CP, LE edema, No palpitations, she  is compliant with her meds. No bleeding.  11/28/2019, this is 6 months follow-up, the patient has been doing great, she is able to walk daily without any symptoms of chest pain or shortness of breath.  She has no lower extremity edema orthopnea proximal nocturnal dyspnea.  She has no further palpitations she has been compliant with her medications and has no bleeding.  She checks her blood pressure at home and it is in the 130 range.  Cardiac Studies  2D Echo 05/24/19 The left ventricle has normal systolic function, with an ejection fraction of 55-60%. The cavity size was normal. Left ventricular diastolic Doppler parameters are consistent with pseudonormalization. No evidence of left ventricular regional wall motion abnormalities. 2. The right ventricle has normal systolic function. The cavity was normal. There is no increase in right ventricular wall thickness. 3. Left atrial size was mildly dilated. 4. No evidence of mitral valve stenosis. Mild mitral regurgitation. 5. The aortic valve is tricuspid. Aortic valve regurgitation is trivial by color flow Doppler. No stenosis of the aortic valve. 6. The aorta is normal in size and structure. 7. The aortic root is normal in size and structure. 8. The IVC was normal in size. PA systolic pressure 28 mmHg.    Current Meds  Medication Sig  . acetaminophen (TYLENOL) 500 MG tablet Take 500 mg by mouth every 6 (six) hours as needed for moderate pain or headache.  Marland Kitchen apixaban (ELIQUIS) 5 MG TABS tablet Take 5 mg by mouth 2 (two) times daily.   . bisacodyl (DULCOLAX) 5 MG EC tablet Take  5 mg by mouth daily as needed for moderate constipation.  . diclofenac sodium (VOLTAREN) 1 % GEL Apply 2 g topically 3 (three) times daily as needed (knee pain).  Marland Kitchen levothyroxine (SYNTHROID, LEVOTHROID) 112 MCG tablet Take 112 mcg by mouth daily.   Marland Kitchen lidocaine-prilocaine (EMLA) cream Apply to affected area once  . metoprolol succinate (TOPROL-XL) 25 MG 24 hr tablet Take  0.5 tablets (12.5 mg total) by mouth daily. Take with or immediately following a meal.  . polyvinyl alcohol (LIQUIFILM TEARS) 1.4 % ophthalmic solution Place 1 drop into both eyes as needed for dry eyes.   Allergies  Allergen Reactions  . Amiodarone Other (See Comments)    Hyperthyroidism    Past Medical History:  Diagnosis Date  . A-fib ([place])   . Anemia    "years ago"  . Atrial fibrillation with rapid ventricular response ([place]) 06/25/2015  . Carpal tunnel syndrome, bilateral   . Cervical spondylosis without myelopathy 10/25/2013  . Dental bridge present   . DJD (degenerative joint disease)   . Dysrhythmia    WENT INTO A FIB IN 2017   . Elevated troponin 02/18/2015  . Follicular lymphoma grade 3a ([place]) 02/03/2015  . History of echocardiogram    Echo 12/16: EF 60-65%, no RWMA, severe LAE  . History of nuclear stress test    Myoview 1/17: EF 55%, Normal study. No ischemia or scar.  . Hypothyroid   . Nausea without vomiting 06/20/2015  . Pneumonia   . Stage III chronic kidney disease 07/01/2015  . Thrush of mouth and esophagus ([place]) 06/25/2015   Family History  Problem Relation Age of Onset  . Cancer Mother        Breast, lung NHL  . Cancer Sister        Multiple myeloma   Past Surgical History:  Procedure Laterality Date  . ABDOMINAL HYSTERECTOMY    . APPENDECTOMY    . BACK SURGERY     X5-lumbar-fusion  . COLONOSCOPY    . DILATION AND CURETTAGE OF UTERUS    . LYMPH NODE BIOPSY Right 01/21/2015   Procedure: RIGHT GROIN LYMPH NODE BIOPSY;  Surgeon: Erroll Luna, MD;  Location: [facility];  Service: General;  Laterality: Right;  . MASS EXCISION Left 08/29/2018   Procedure: EXCISION LEFT BACK  MASS;  Surgeon: Erroll Luna, MD;  Location: [facility];  Service: General;  Laterality: Left;  . Ovarian cyst resection    . patelar tendon transplants     Left/right  . PORTACATH PLACEMENT Right 02/13/2015   Procedure: INSERTION PORT-A-CATH WITH ULTRASOUND;  Surgeon: Erroll Luna, MD;  Location: [facility];  Service: General;  Laterality: Right;  . PORTACATH PLACEMENT N/A 08/29/2018   Procedure: INSERTION PORT-A-CATH WITH ULTRA SOUND ERAS PATHWAY;  Surgeon: Erroll Luna, MD;  Location: [place];  Service: General;  Laterality: N/A;  . TOTAL KNEE ARTHROPLASTY  2011   Right  . TUBAL LIGATION     Social History   Socioeconomic History  . Marital status: Widowed    Spouse name: Not on file  . Number of children: 2  . Years of education: hs  . Highest education level: Not on file  Occupational History  . Occupation: Retired  Tobacco Use  . Smoking status: Never Smoker  . Smokeless tobacco: Never Used  Substance and Sexual Activity  . Alcohol use: Yes    Comment: occassional wine  . Drug use: No  . Sexual activity: Not on file  Other Topics Concern  .  Not on file  Social History Narrative   Lives alone.  Has a walker for home use.   Social Determinants of Health   Financial Resource Strain:   . Difficulty of Paying Living Expenses: Not on file  Food Insecurity:   . Worried About Charity fundraiser in the Last Year: Not on file  . Ran Out of Food in the Last Year: Not on file  Transportation Needs:   . Lack of Transportation (Medical): Not on file  . Lack of Transportation (Non-Medical): Not on file  Physical Activity:   . Days of Exercise per Week: Not on file  . Minutes of Exercise per Session: Not on file  Stress:   . Feeling of Stress : Not on file  Social Connections:   . Frequency of Communication with Friends and Family: Not on file  . Frequency of Social Gatherings with Friends and Family: Not on file  . Attends Religious Services: Not on file  . Active Member of Clubs or Organizations: Not on file  . Attends Archivist Meetings: Not on file  . Marital Status: Not on file  Intimate Partner Violence:   . Fear of Current or Ex-Partner: Not on file  . Emotionally Abused: Not on file  . Physically Abused: Not on file  .  Sexually Abused: Not on file     Lipid Panel  No results found for: CHOL, TRIG, HDL, CHOLHDL, VLDL, LDLCALC, LDLDIRECT  Review of Systems: General: negative for chills, fever, night sweats or weight changes.  Cardiovascular: negative for chest pain, dyspnea on exertion, edema, orthopnea, palpitations, paroxysmal nocturnal dyspnea or shortness of breath Dermatological: negative for rash Respiratory: negative for cough or wheezing Urologic: negative for hematuria Abdominal: negative for nausea, vomiting, diarrhea, bright red blood per rectum, melena, or hematemesis Neurologic: negative for visual changes, syncope, or dizziness All other systems reviewed and are otherwise negative except as noted above.   Physical Exam:  Blood pressure (!) 142/82, pulse 77, height '5\' 1"'  (1.549 m), weight 141 lb 12.8 oz (64.3 kg).  General appearance: alert, cooperative and no distress Neck: no carotid bruit and no JVD Lungs: clear to auscultation bilaterally Heart: regular rate and rhythm, S1, S2 normal, no murmur, click, rub or gallop Extremities: extremities normal, atraumatic, no cyanosis or edema Pulses: 2+ and symmetric Skin: Skin color, texture, turgor normal. No rashes or lesions Neurologic: Grossly normal  EKG performed, it shows sinus bradycardia 55 bpm, poor R wave progression in the anterior leads, unchanged from prior, personally reviewed     ASSESSMENT AND PLAN:   1. Systolic HF/ NICM: suspect tachy mediated as LVEF normalized post conversion from AFib to NSR. EF improved from 25%>>55-60%.  She had finding of worsening creatinine on her last blood work in [place], we do not have dose but we will obtain.  Considering that her LVEF has normalized we discontinued Entresto, the patient remains euvolemic with functional class NYHA I. Her creatinine improved from 1.8-1.2. We will avoid any ACE or ARBs in the future.  2. Atrial Fibrillation: treated w/ DCCV in May. Maintaining NSR w/  amiodarone. HR controlled w/ low-dose metoprolol. On Eliquis for a/c. No abnormal bleeding.  Hemoglobin 13.2.  3. HTN: controlled on current regimen.   4. Grade 3 Follicular lymphoma:  s/p chemo, the last dose in April 2020, followed by Dr Alvy Bimler, records reviewed  Follow-Up with 6 months.  Ena Dawley, MD [facility] 11/28/2019 4:19 PM

## 2019-11-28 ENCOUNTER — Ambulatory Visit (INDEPENDENT_AMBULATORY_CARE_PROVIDER_SITE_OTHER): Payer: Medicare HMO | Admitting: Cardiology

## 2019-11-28 ENCOUNTER — Other Ambulatory Visit: Payer: Self-pay

## 2019-11-28 ENCOUNTER — Encounter: Payer: Self-pay | Admitting: Cardiology

## 2019-11-28 VITALS — BP 142/82 | HR 77 | Ht 61.0 in | Wt 141.8 lb

## 2019-11-28 DIAGNOSIS — I495 Sick sinus syndrome: Secondary | ICD-10-CM

## 2019-11-28 DIAGNOSIS — I480 Paroxysmal atrial fibrillation: Secondary | ICD-10-CM

## 2019-11-28 DIAGNOSIS — I10 Essential (primary) hypertension: Secondary | ICD-10-CM

## 2019-11-28 DIAGNOSIS — I5022 Chronic systolic (congestive) heart failure: Secondary | ICD-10-CM | POA: Diagnosis not present

## 2019-11-28 NOTE — Patient Instructions (Signed)
Medication Instructions:   Your physician recommends that you continue on your current medications as directed. Please refer to the Current Medication list given to you today.  *If you need a refill on your cardiac medications before your next appointment, please call your pharmacy*    Follow-Up: At [facility], you and your health needs are our priority.  As part of our continuing mission to provide you with exceptional heart care, we have created designated Provider Care Teams.  These Care Teams include your primary Cardiologist (physician) and Advanced Practice Providers (APPs -  Physician Assistants and Nurse Practitioners) who all work together to provide you with the care you need, when you need it.  Your next appointment:   6 month(s)  The format for your next appointment:   In Person  Provider:   Katarina Nelson, MD    

## 2019-12-26 ENCOUNTER — Emergency Department (HOSPITAL_COMMUNITY)
Admission: EM | Admit: 2019-12-26 | Discharge: 2019-12-26 | Discharge status: Against Medical Advice | Payer: No Typology Code available for payment source | Attending: Emergency Medicine | Admitting: Emergency Medicine

## 2019-12-26 ENCOUNTER — Other Ambulatory Visit: Payer: Self-pay

## 2019-12-26 ENCOUNTER — Encounter (HOSPITAL_COMMUNITY): Payer: Self-pay | Admitting: Emergency Medicine

## 2019-12-26 DIAGNOSIS — I4891 Unspecified atrial fibrillation: Secondary | ICD-10-CM | POA: Diagnosis not present

## 2019-12-26 DIAGNOSIS — Z79899 Other long term (current) drug therapy: Secondary | ICD-10-CM | POA: Diagnosis not present

## 2019-12-26 DIAGNOSIS — M79604 Pain in right leg: Secondary | ICD-10-CM

## 2019-12-26 NOTE — ED Provider Notes (Signed)
[facility] DEPT Provider Note   CSN: [id number] Arrival date & time: 12/26/19  1316     History Chief Complaint  Patient presents with  . Leg Pain    Carrie [name] is a 79 y.o. female history includes A. fib, anemia, DJD, back surgery, CKD, GERD, diabetes.  Patient presents today for right leg pain onset at 10 AM this morning, no clear inciting event, aching throb of the entire right leg constant worsened with movement slightly improved with Tylenol.  She reports history of the same last month which eventually improved with Percocet.  She reports that she called her orthopedic specialist at the [facility] today they were unable to see her so she came to the ER for evaluation.  Denies fever/chills, headache, neck pain, upper back pain, numbness/tingling, weakness, fall/injury, saddle or paresthesias, bowel/bladder incontinence, urinary retention, dysuria/hematuria, abdominal pain, nausea/vomiting or any additional concerns today.  HPI     Past Medical History:  Diagnosis Date  . A-fib ([place])   . Anemia    "years ago"  . Atrial fibrillation with rapid ventricular response ([place]) 06/25/2015  . Carpal tunnel syndrome, bilateral   . Cervical spondylosis without myelopathy 10/25/2013  . Dental bridge present   . DJD (degenerative joint disease)   . Dysrhythmia    WENT INTO A FIB IN 2017   . Elevated troponin 02/18/2015  . Follicular lymphoma grade 3a ([place]) 02/03/2015  . History of echocardiogram    Echo 12/16: EF 60-65%, no RWMA, severe LAE  . History of nuclear stress test    Myoview 1/17: EF 55%, Normal study. No ischemia or scar.  . Hypothyroid   . Nausea without vomiting 06/20/2015  . Pneumonia   . Stage III chronic kidney disease 07/01/2015  . Thrush of mouth and esophagus ([place]) 06/25/2015    Patient Active Problem List   Diagnosis Date Noted  . Chronic leukopenia 09/25/2019  . Syncope 03/05/2019  . Postural dizziness with presyncope 03/04/2019  .  Atypical chest pain   . Near syncope   . Acute systolic congestive heart failure ([place])   . Atrial fibrillation with RVR ([place]) 02/25/2019  . GERD with esophagitis 09/19/2018  . Skin lesions 06/06/2017  . Dyspnea on exertion 03/25/2017  . Anxiety disorder 03/25/2017  . Generalized weakness 03/19/2017  . Insomnia disorder 03/19/2017  . Dysuria 08/31/2016  . Acute back pain with sciatica, right 08/31/2016  . Chronic back pain 07/02/2016  . Quality of life palliative care encounter 03/02/2016  . Port catheter in place 03/01/2016  . Chronic atrial fibrillation 09/08/2015  . Essential hypertension 07/02/2015  . Diabetes mellitus, likely due to steroids 06/27/2015  . Chronic kidney disease, stage III (moderate) ([place]) 06/25/2015  . PAF (paroxysmal atrial fibrillation) ([place]) 06/25/2015  . Atrial fibrillation with rapid ventricular response ([place]) 06/25/2015  . Other fatigue 03/07/2015  . Grade 3a follicular lymphoma of lymph nodes of multiple regions ([place]) 02/03/2015  . Hypothyroidism 09/09/2009    Past Surgical History:  Procedure Laterality Date  . ABDOMINAL HYSTERECTOMY    . APPENDECTOMY    . BACK SURGERY     X5-lumbar-fusion  . COLONOSCOPY    . DILATION AND CURETTAGE OF UTERUS    . LYMPH NODE BIOPSY Right 01/21/2015   Procedure: RIGHT GROIN LYMPH NODE BIOPSY;  Surgeon: Erroll Luna, MD;  Location: [facility];  Service: General;  Laterality: Right;  . MASS EXCISION Left 08/29/2018   Procedure: EXCISION LEFT BACK  MASS;  Surgeon: Brantley Stage,  Marcello Moores, MD;  Location: [facility];  Service: General;  Laterality: Left;  . Ovarian cyst resection    . patelar tendon transplants     Left/right  . PORTACATH PLACEMENT Right 02/13/2015   Procedure: INSERTION PORT-A-CATH WITH ULTRASOUND;  Surgeon: Erroll Luna, MD;  Location: [facility];  Service: General;  Laterality: Right;  . PORTACATH PLACEMENT N/A 08/29/2018   Procedure: INSERTION PORT-A-CATH WITH ULTRA SOUND ERAS PATHWAY;  Surgeon:  Erroll Luna, MD;  Location: [facility];  Service: General;  Laterality: N/A;  . TOTAL KNEE ARTHROPLASTY  2011   Right  . TUBAL LIGATION       OB History   No obstetric history on file.     Family History  Problem Relation Age of Onset  . Cancer Mother        Breast, lung NHL  . Cancer Sister        Multiple myeloma    Social History   Tobacco Use  . Smoking status: Never Smoker  . Smokeless tobacco: Never Used  Substance Use Topics  . Alcohol use: Yes    Comment: occassional wine  . Drug use: No    Home Medications Prior to Admission medications   Medication Sig Start Date End Date Taking? Authorizing Provider  acetaminophen (TYLENOL) 500 MG tablet Take 500 mg by mouth every 6 (six) hours as needed for moderate pain or headache.   Yes [provider]  apixaban (ELIQUIS) 5 MG TABS tablet Take 5 mg by mouth 2 (two) times daily.    Yes [provider]  bisacodyl (DULCOLAX) 5 MG EC tablet Take 5 mg by mouth daily as needed for moderate constipation.   Yes [provider]  diclofenac sodium (VOLTAREN) 1 % GEL Apply 2 g topically 3 (three) times daily as needed (knee pain). 08/01/18  Yes Quintella Reichert, MD  levothyroxine (SYNTHROID, LEVOTHROID) 112 MCG tablet Take 112 mcg by mouth daily.  04/22/15  Yes [provider]  lidocaine-prilocaine (EMLA) cream Apply to affected area once Patient taking differently: Apply 1 application topically as needed (port access). Apply to affected area once 09/07/18  Yes Nicholas Lose, MD  metoprolol succinate (TOPROL-XL) 25 MG 24 hr tablet Take 0.5 tablets (12.5 mg total) by mouth daily. Take with or immediately following a meal. 06/20/19  Yes Rosita Fire, Brittainy M, PA-C  polyvinyl alcohol (LIQUIFILM TEARS) 1.4 % ophthalmic solution Place 1 drop into both eyes as needed for dry eyes.   Yes [provider]    Allergies    Amiodarone  Review of Systems   Review of Systems Ten systems are reviewed and are  negative for acute change except as noted in the HPI  Physical Exam Updated Vital Signs BP 137/81 (BP Location: Right Arm)   Pulse 60   Temp 97.6 F (36.4 C) (Oral)   Resp 19   SpO2 100%   Physical Exam Constitutional:      General: She is not in acute distress.    Appearance: Normal appearance. She is well-developed. She is not ill-appearing or diaphoretic.  HENT:     Head: Normocephalic and atraumatic.     Right Ear: External ear normal.     Left Ear: External ear normal.     Nose: Nose normal.  Eyes:     General: Vision grossly intact. Gaze aligned appropriately.     Pupils: Pupils are equal, round, and reactive to light.  Neck:     Trachea: Trachea and phonation normal. No tracheal deviation.  Pulmonary:     Effort: Pulmonary effort is normal. No respiratory distress.  Abdominal:     General: There is no distension.     Palpations: Abdomen is soft.     Tenderness: There is no abdominal tenderness. There is no guarding or rebound.  Musculoskeletal:        General: Normal range of motion.     Cervical back: Normal range of motion.     Right lower leg: No edema.     Left lower leg: No edema.     Comments: No midline C/T/L spinal tenderness to palpation, no paraspinal muscle tenderness, no deformity, crepitus, or step-off noted. No sign of injury to the neck or back.  Skin:    General: Skin is warm and dry.  Neurological:     Mental Status: She is alert.     GCS: GCS eye subscore is 4. GCS verbal subscore is 5. GCS motor subscore is 6.     Comments: Speech is clear and goal oriented, follows commands Major Cranial nerves without deficit, no facial droop Moves extremities without ataxia, coordination intact No clonus of the feet  Psychiatric:        Behavior: Behavior normal.     ED Results / Procedures / Treatments   Labs (all labs ordered are listed, but only abnormal results are displayed) Labs Reviewed - No data to display  EKG None  Radiology No results  found.  Procedures Procedures (including critical care time)  Medications Ordered in ED Medications - No data to display  ED Course  I have reviewed the triage vital signs and the nursing notes.  Pertinent labs & imaging results that were available during my care of the patient were reviewed by me and considered in my medical decision making (see chart for details).    MDM Rules/Calculators/A&P                     , Initial evaluation patient ambulating back from the bathroom unassisted and without difficulty.  She is fully dressed.  She reports onset of right leg pain this morning no preceding trauma.  History of similar 1 month ago.  She reports that her orthopedist feels this is due to sciatica but she was unable to see her orthopedist today but she came to the ER.  She has no red flag symptoms concerning for cauda equina and no infectious symptoms to suggest spinal epidural abscess, UTI or kidney stone.  She has no evidence of injury, no gross ligamentous laxity or evidence of DVT.  During my initial evaluation patient was using her cell phone to call her "back surgeon Dr. Patrice Paradise".  She then informed me that she is leaving at this time and does not want any further work-up here in the ER.  I discussed potential x-ray imaging and other tests here in the ER she refused all.  I informed patient that she will be leaving [place] and that she may return at any time.  Discussed the risks of leaving [facility] including pain, infection, disability and potentially death and stated understanding.  She is fully alert and oriented and has mental capacity make her own medical decisions.  Patient ambulated out of the ER without assistance.   Note: Portions of this report may have been transcribed using voice recognition software. Every effort was made to ensure accuracy; however, inadvertent computerized transcription errors may still be present. Final Clinical Impression(s) / ED  Diagnoses Final diagnoses:  Right leg pain    Rx / DC Orders ED Discharge Orders    None       Gari Crown 12/26/19 1543    Valarie Merino, MD 12/26/19 1547

## 2019-12-26 NOTE — ED Triage Notes (Signed)
Pt reports that she been having right leg pains for over month. Been seen at [facility] for it and had xray. Scheduled for a bone scan. Denies any falls or injuries. Orthopedic doctor told her to go to ED as he couldn't do any thing for her today when she called them on the phone.

## 2019-12-26 NOTE — Discharge Instructions (Signed)
Your leaving AGAINST MEDICAL ADVICE at this time.  I strongly advised that you stay in the emergency department for completion of your work-up.  [place] has many risks including but not limited to worsening of pain, infection, disability and potentially death.  You may return to the emergency department at any time for reevaluation.

## 2020-01-01 ENCOUNTER — Other Ambulatory Visit: Payer: Self-pay | Admitting: Orthopaedic Surgery

## 2020-01-01 ENCOUNTER — Telehealth: Payer: Self-pay | Admitting: Nurse Practitioner

## 2020-01-01 DIAGNOSIS — M5136 Other intervertebral disc degeneration, lumbar region: Secondary | ICD-10-CM

## 2020-01-01 NOTE — Telephone Encounter (Signed)
Phone call to patient to verify medication list and allergies for myelogram procedure. Pt aware she will need to hold her eliquis for this procedure, pending approval and recommended hold time from cardiologist, Filbert Berthold, PA-C. Pre and post procedure instructions reviewed with pt. Pt verbalized understanding.

## 2020-01-08 ENCOUNTER — Ambulatory Visit
Admission: RE | Admit: 2020-01-08 | Discharge: 2020-01-08 | Disposition: A | Discharge status: Home / Self Care | Payer: Medicare HMO | Source: Ambulatory Visit | Attending: Orthopaedic Surgery | Admitting: Orthopaedic Surgery

## 2020-01-08 VITALS — BP 146/76 | HR 61

## 2020-01-08 DIAGNOSIS — M5136 Other intervertebral disc degeneration, lumbar region: Secondary | ICD-10-CM

## 2020-01-08 DIAGNOSIS — M5441 Lumbago with sciatica, right side: Secondary | ICD-10-CM

## 2020-01-08 MED ORDER — DIAZEPAM 5 MG PO TABS: 5.0000 mg | ORAL_TABLET | Freq: Once | ORAL | Status: DC

## 2020-01-08 MED ORDER — IOPAMIDOL (ISOVUE-M 200) INJECTION 41%
20.0000 mL | Freq: Once | INTRAMUSCULAR | Status: AC
  Administered 2020-01-08: 20 mL via INTRATHECAL

## 2020-01-08 NOTE — Discharge Instructions (Signed)
Myelogram Discharge Instructions  1. Go home and rest quietly for the next 24 hours.  It is important to lie flat for the next 24 hours.  Get up only to go to the restroom.  You may lie in the bed or on a couch on your back, your stomach, your left side or your right side.  You may have one pillow under your head.  You may have pillows between your knees while you are on your side or under your knees while you are on your back.  2. DO NOT drive today.  Recline the seat as far back as it will go, while still wearing your seat belt, on the way home.  3. You may get up to go to the bathroom as needed.  You may sit up for 10 minutes to eat.  You may resume your normal diet and medications unless otherwise indicated.  Drink lots of extra fluids today and tomorrow.  4. The incidence of headache, nausea, or vomiting is about 5% (one in 20 patients).  If you develop a headache, lie flat and drink plenty of fluids until the headache goes away.  Caffeinated beverages may be helpful.  If you develop severe nausea and vomiting or a headache that does not go away with flat bed rest, call [phone].  5. You may resume normal activities after your 24 hours of bed rest is over; however, do not exert yourself strongly or do any heavy lifting tomorrow. If when you get up you have a headache when standing, go back to bed and force fluids for another 24 hours.  6. Call your physician for a follow-up appointment.  The results of your myelogram will be sent directly to your physician by the following day.  7. If you have any questions or if complications develop after you arrive home, please call [phone].  Discharge instructions have been explained to the patient.  The patient, or the person responsible for the patient, fully understands these instructions.  YOU MAY RESTART YOUR ELIQUIS TOMORROW 01/09/20 AT NEXT SCHEDULED DOSE.

## 2020-01-08 NOTE — Progress Notes (Signed)
Patient states she has been off Eliquis for at least the past two days. 

## 2020-01-14 ENCOUNTER — Inpatient Hospital Stay: Payer: Medicare HMO

## 2020-01-14 ENCOUNTER — Encounter: Payer: Self-pay | Admitting: Hematology and Oncology

## 2020-01-14 ENCOUNTER — Other Ambulatory Visit: Payer: Self-pay

## 2020-01-14 ENCOUNTER — Inpatient Hospital Stay: Payer: Medicare HMO | Attending: Hematology and Oncology | Admitting: Hematology and Oncology

## 2020-01-14 DIAGNOSIS — Z9225 Personal history of immunosupression therapy: Secondary | ICD-10-CM | POA: Insufficient documentation

## 2020-01-14 DIAGNOSIS — C8238 Follicular lymphoma grade IIIa, lymph nodes of multiple sites: Secondary | ICD-10-CM

## 2020-01-14 DIAGNOSIS — M79604 Pain in right leg: Secondary | ICD-10-CM | POA: Insufficient documentation

## 2020-01-14 DIAGNOSIS — M5440 Lumbago with sciatica, unspecified side: Secondary | ICD-10-CM

## 2020-01-14 DIAGNOSIS — Z9221 Personal history of antineoplastic chemotherapy: Secondary | ICD-10-CM | POA: Insufficient documentation

## 2020-01-14 DIAGNOSIS — Z452 Encounter for adjustment and management of vascular access device: Secondary | ICD-10-CM | POA: Insufficient documentation

## 2020-01-14 DIAGNOSIS — Z79899 Other long term (current) drug therapy: Secondary | ICD-10-CM | POA: Insufficient documentation

## 2020-01-14 DIAGNOSIS — Z791 Long term (current) use of non-steroidal anti-inflammatories (NSAID): Secondary | ICD-10-CM | POA: Diagnosis not present

## 2020-01-14 DIAGNOSIS — Z7901 Long term (current) use of anticoagulants: Secondary | ICD-10-CM | POA: Diagnosis not present

## 2020-01-14 DIAGNOSIS — G8929 Other chronic pain: Secondary | ICD-10-CM | POA: Diagnosis not present

## 2020-01-14 DIAGNOSIS — M48061 Spinal stenosis, lumbar region without neurogenic claudication: Secondary | ICD-10-CM | POA: Diagnosis not present

## 2020-01-14 LAB — CBC WITH DIFFERENTIAL/PLATELET
Abs Immature Granulocytes: 0.01 10*3/uL (ref 0.00–0.07)
Basophils Absolute: 0 10*3/uL (ref 0.0–0.1)
Basophils Relative: 1 %
Eosinophils Absolute: 0.1 10*3/uL (ref 0.0–0.5)
Eosinophils Relative: 3 %
HCT: 40.8 % (ref 36.0–46.0)
Hemoglobin: 13.1 g/dL (ref 12.0–15.0)
Immature Granulocytes: 0 %
Lymphocytes Relative: 10 %
Lymphs Abs: 0.4 10*3/uL — ABNORMAL LOW (ref 0.7–4.0)
MCH: 29.8 pg (ref 26.0–34.0)
MCHC: 32.1 g/dL (ref 30.0–36.0)
MCV: 92.7 fL (ref 80.0–100.0)
Monocytes Absolute: 0.5 10*3/uL (ref 0.1–1.0)
Monocytes Relative: 11 %
Neutro Abs: 3 10*3/uL (ref 1.7–7.7)
Neutrophils Relative %: 75 %
Platelets: 161 10*3/uL (ref 150–400)
RBC: 4.4 MIL/uL (ref 3.87–5.11)
RDW: 13.6 % (ref 11.5–15.5)
WBC: 4.1 10*3/uL (ref 4.0–10.5)
nRBC: 0 % (ref 0.0–0.2)

## 2020-01-14 LAB — COMPREHENSIVE METABOLIC PANEL
ALT: 10 U/L (ref 0–44)
AST: 16 U/L (ref 15–41)
Albumin: 4 g/dL (ref 3.5–5.0)
Alkaline Phosphatase: 79 U/L (ref 38–126)
Anion gap: 6 (ref 5–15)
BUN: 18 mg/dL (ref 8–23)
CO2: 27 mmol/L (ref 22–32)
Calcium: 9.1 mg/dL (ref 8.9–10.3)
Chloride: 108 mmol/L (ref 98–111)
Creatinine, Ser: 1.05 mg/dL — ABNORMAL HIGH (ref 0.44–1.00)
GFR calc Af Amer: 59 mL/min — ABNORMAL LOW (ref >60)
GFR calc non Af Amer: 51 mL/min — ABNORMAL LOW (ref >60)
Glucose, Bld: 79 mg/dL (ref 70–99)
Potassium: 4.2 mmol/L (ref 3.5–5.1)
Sodium: 141 mmol/L (ref 135–145)
Total Bilirubin: 0.7 mg/dL (ref 0.3–1.2)
Total Protein: 6.4 g/dL — ABNORMAL LOW (ref 6.5–8.1)

## 2020-01-14 MED ORDER — SODIUM CHLORIDE 0.9% FLUSH
10.0000 mL | Freq: Once | INTRAVENOUS | Status: AC
  Administered 2020-01-14: 10 mL
  Filled 2020-01-14: qty 10

## 2020-01-14 MED ORDER — HEPARIN SOD (PORK) LOCK FLUSH 100 UNIT/ML IV SOLN
500.0000 [IU] | Freq: Once | INTRAVENOUS | Status: AC
  Administered 2020-01-14: 500 [IU]
  Filled 2020-01-14: qty 5

## 2020-01-14 NOTE — Assessment & Plan Note (Signed)
Her blood work and recent imaging studies showed no evidence of cancer recurrence Her examination is benign She is at high risk of recurrent cancer given multiple relapse I will see her again in 2 months for blood work, port flush maintenance as well as examination 

## 2020-01-14 NOTE — Progress Notes (Signed)
[place] OFFICE PROGRESS NOTE  Patient Care Team: Domenick Bookbinder, MD as PCP - General (Internal Medicine) [name], MD as PCP - Cardiology (Cardiology) Carola Frost, RN as Registered Nurse (Medical Oncology) [name], MD as Attending Physician (Internal Medicine)  ASSESSMENT & PLAN:  Grade 3a follicular lymphoma of lymph nodes of multiple regions [facility]) Her blood work and recent imaging studies showed no evidence of cancer recurrence Her examination is benign She is at high risk of recurrent cancer given multiple relapse I will see her again in 2 months for blood work, port flush maintenance as well as examination  Chronic back pain She has chronic back pain and leg pain She is undergoing evaluation by orthopedic surgery I will defer to them for further management   No orders of the defined types were placed in this encounter.   All questions were answered. The patient knows to call the clinic with any problems, questions or concerns. The total time spent in the appointment was 15 minutes encounter with patients including review of chart and various tests results, discussions about plan of care and coordination of care plan   [name], MD 01/14/2020 11:43 AM  INTERVAL HISTORY: Please see below for problem oriented charting. She returns for further follow-up She is having a lot of back pain and sciatica right leg pain No new lymphadenopathy No recent infection, fever or chills  SUMMARY OF ONCOLOGIC HISTORY: Oncology History  Grade 3a follicular lymphoma of lymph nodes of multiple regions ([place])  01/21/2015 Surgery   She underwent excisional lymph node biopsy that came back follicular lymphoma grade 3   01/21/2015 Pathology Results   Accession: [id number] biopsy confirmed follicular lymphoma   4/69/6295 Imaging   Echocardiogram showed ejection fraction of 55-60%   02/11/2015 Imaging    PET CT scan show possible splenic involvement and  diffuse lymphadenopathy throughout   02/11/2015 Bone Marrow Biopsy    bone marrow biopsy was performed and is involved by lymphoma with translocation of igH/BCL2   02/13/2015 Procedure   She had port placement.   02/17/2015 - 06/02/2015 Chemotherapy   She received R-CHOP chemo x 6   02/17/2015 Adverse Reaction   She had mild infusion reaction with cycle 1 of treatment.   04/18/2015 Imaging   PET CT scan showed near complete response to treatment.   04/22/2015 Adverse Reaction   Vincristine dose was reduced by 50% due to neuropathy from cycle 4 onwards   06/25/2015 - 07/06/2015 Hospital Admission   She was hospitalized for recent sepsis/bacteremia and a fib with RVR   07/11/2015 Imaging   repeat PEt scan showed complete response to Rx   07/14/2015 - 06/16/2017 Chemotherapy   She received maintenance Rituximab every 60 days   12/15/2015 Imaging   PET CT showed no evidence of cancer recurrence   06/15/2016 PET scan   No evidence of active lymphoma on skullbase to thigh FDG PET scan. Small LEFT periaortic retroperitoneal lymph nodes without significant metabolic activity ( Deauville 1). No change from prior   03/24/2017 Imaging   1. Borderline enlarged abdominal retroperitoneal lymph nodes, stable. No new adenopathy in the chest, abdomen or pelvis. 2. Aortic atherosclerosis (ICD10-170.0). 3. Enlarged pulmonary arteries, indicative of pulmonary arterial hypertension.   09/14/2017 PET scan   Stable exam. No evidence of active lymphoma or other acute findings. (Deauville score 1)   08/08/2018 PET scan   1. Evidence of progressive/recurrent disease, as evidenced by new hypermetabolic nodes and subcutaneous nodule/nodes  throughout left chest and abdomen. (Deauville 5). 2. Likely physiologic right nasopharyngeal hypermetabolism. Recommend attention on follow-up. 3. Incidental findings, including pulmonary artery enlargement, suggesting pulmonary arterial hypertension, stable left lower lobe  pulmonary nodule, and aortic Atherosclerosis (ICD10-I70.0).   08/29/2018 Initial Biopsy   Soft tissue simple excision left back: Follicular lymphoma grade 1-2 positive for CD20, PAX5, bcl-6, and bcl-2.  Ki-67 30%   09/11/2018 - 02/20/2019 Chemotherapy   The patient had Bendamustine and Rituximab   12/13/2018 Imaging   1. Response to therapy, as evidenced by resolution of left chest wall nodularity and decrease in size of small left axillary nodes. 2. No residual soft tissue thickening at the site of hypermetabolism along the posterior left eleventh rib. Resolution of adjacent subcutaneous nodularity. 3. No new or progressive disease identified. 4.  Aortic Atherosclerosis (ICD10-I70.0). 5. Bilateral pulmonary nodules, similar. 6. Trace air within the urinary bladder could be iatrogenic. Possible pericystic edema. Correlate with symptoms of cystitis and recent instrumentation.   07/30/2019 Imaging   Ct chest, abdomen and pelvis No findings suspicious for active lymphoma in the chest, abdomen, or pelvis.   Small bilateral pulmonary nodules, unchanged, benign.   Again noted is trace nondependent gas within the bladder. Correlate for recent intervention.     REVIEW OF SYSTEMS:   Constitutional: Denies fevers, chills or abnormal weight loss Eyes: Denies blurriness of vision Ears, nose, mouth, throat, and face: Denies mucositis or sore throat Respiratory: Denies cough, dyspnea or wheezes Cardiovascular: Denies palpitation, chest discomfort or lower extremity swelling Gastrointestinal:  Denies nausea, heartburn or change in bowel habits Skin: Denies abnormal skin rashes Lymphatics: Denies new lymphadenopathy or easy bruising Neurological:Denies numbness, tingling or new weaknesses Behavioral/Psych: Mood is stable, no new changes  All other systems were reviewed with the patient and are negative.  I have reviewed the past medical history, past surgical history, social history and family  history with the patient and they are unchanged from previous note.  ALLERGIES:  is allergic to amiodarone.  MEDICATIONS:  Current Outpatient Medications  Medication Sig Dispense Refill  . acetaminophen (TYLENOL) 500 MG tablet Take 500 mg by mouth every 6 (six) hours as needed for moderate pain or headache.    Marland Kitchen apixaban (ELIQUIS) 5 MG TABS tablet Take 5 mg by mouth 2 (two) times daily.     . bisacodyl (DULCOLAX) 5 MG EC tablet Take 5 mg by mouth daily as needed for moderate constipation.    . diclofenac sodium (VOLTAREN) 1 % GEL Apply 2 g topically 3 (three) times daily as needed (knee pain). 100 g 0  . levothyroxine (SYNTHROID, LEVOTHROID) 112 MCG tablet Take 112 mcg by mouth daily.   9  . lidocaine-prilocaine (EMLA) cream Apply to affected area once (Patient taking differently: Apply 1 application topically as needed (port access). Apply to affected area once) 30 g 3  . metoprolol succinate (TOPROL-XL) 25 MG 24 hr tablet Take 0.5 tablets (12.5 mg total) by mouth daily. Take with or immediately following a meal. 90 tablet 3  . polyvinyl alcohol (LIQUIFILM TEARS) 1.4 % ophthalmic solution Place 1 drop into both eyes as needed for dry eyes.     No current facility-administered medications for this visit.   Facility-Administered Medications Ordered in Other Visits  Medication Dose Route Frequency Provider Last Rate Last Admin  . ondansetron (ZOFRAN) 8 mg in sodium chloride 0.9 % 50 mL IVPB   Intravenous Once Montray Kliebert, MD      . sodium chloride 0.9 % injection  10 mL  10 mL Intravenous PRN Alvy Bimler, Londan Coplen, MD   10 mL at 04/05/17 1315    PHYSICAL EXAMINATION: ECOG PERFORMANCE STATUS: 1 - Symptomatic but completely ambulatory  Vitals:   01/14/20 1055  BP: (!) 153/85  Pulse: 64  Resp: 18  Temp: 97.9 F (36.6 C)  SpO2: 98%   Filed Weights   01/14/20 1055  Weight: 139 lb 12.8 oz (63.4 kg)    GENERAL:alert, no distress and comfortable SKIN: skin color, texture, turgor are normal, no  rashes or significant lesions EYES: normal, Conjunctiva are pink and non-injected, sclera clear OROPHARYNX:no exudate, no erythema and lips, buccal mucosa, and tongue normal  NECK: supple, thyroid normal size, non-tender, without nodularity LYMPH:  no palpable lymphadenopathy in the cervical, axillary or inguinal LUNGS: clear to auscultation and percussion with normal breathing effort HEART: regular rate & rhythm and no murmurs and no lower extremity edema ABDOMEN:abdomen soft, non-tender and normal bowel sounds Musculoskeletal:no cyanosis of digits and no clubbing  NEURO: alert & oriented x 3 with fluent speech, no focal motor/sensory deficits  LABORATORY DATA:  I have reviewed the data as listed    Component Value Date/Time   NA 141 01/14/2020 1040   NA 143 07/16/2019 1132   NA 142 08/29/2017 0838   K 4.2 01/14/2020 1040   K 4.2 08/29/2017 0838   CL 108 01/14/2020 1040   CO2 27 01/14/2020 1040   CO2 23 08/29/2017 0838   GLUCOSE 79 01/14/2020 1040   GLUCOSE 97 08/29/2017 0838   BUN 18 01/14/2020 1040   BUN 27 07/16/2019 1132   BUN 24.0 08/29/2017 0838   CREATININE 1.05 (H) 01/14/2020 1040   CREATININE 1.51 (H) 07/30/2019 1010   CREATININE 1.0 08/29/2017 0838   CALCIUM 9.1 01/14/2020 1040   CALCIUM 8.9 08/29/2017 0838   PROT 6.4 (L) 01/14/2020 1040   PROT 6.4 08/29/2017 0838   ALBUMIN 4.0 01/14/2020 1040   ALBUMIN 3.8 08/29/2017 0838   AST 16 01/14/2020 1040   AST 13 (L) 07/30/2019 1010   AST 18 08/29/2017 0838   ALT 10 01/14/2020 1040   ALT 9 07/30/2019 1010   ALT 16 08/29/2017 0838   ALKPHOS 79 01/14/2020 1040   ALKPHOS 93 08/29/2017 0838   BILITOT 0.7 01/14/2020 1040   BILITOT 0.5 07/30/2019 1010   BILITOT 0.33 08/29/2017 0838   GFRNONAA 51 (L) 01/14/2020 1040   GFRNONAA 33 (L) 07/30/2019 1010   GFRAA 59 (L) 01/14/2020 1040   GFRAA 39 (L) 07/30/2019 1010    No results found for: SPEP, UPEP  Lab Results  Component Value Date   WBC 4.1 01/14/2020    NEUTROABS 3.0 01/14/2020   HGB 13.1 01/14/2020   HCT 40.8 01/14/2020   MCV 92.7 01/14/2020   PLT 161 01/14/2020      Chemistry      Component Value Date/Time   NA 141 01/14/2020 1040   NA 143 07/16/2019 1132   NA 142 08/29/2017 0838   K 4.2 01/14/2020 1040   K 4.2 08/29/2017 0838   CL 108 01/14/2020 1040   CO2 27 01/14/2020 1040   CO2 23 08/29/2017 0838   BUN 18 01/14/2020 1040   BUN 27 07/16/2019 1132   BUN 24.0 08/29/2017 0838   CREATININE 1.05 (H) 01/14/2020 1040   CREATININE 1.51 (H) 07/30/2019 1010   CREATININE 1.0 08/29/2017 0838      Component Value Date/Time   CALCIUM 9.1 01/14/2020 1040   CALCIUM 8.9 08/29/2017 0838  ALKPHOS 79 01/14/2020 1040   ALKPHOS 93 08/29/2017 0838   AST 16 01/14/2020 1040   AST 13 (L) 07/30/2019 1010   AST 18 08/29/2017 0838   ALT 10 01/14/2020 1040   ALT 9 07/30/2019 1010   ALT 16 08/29/2017 0838   BILITOT 0.7 01/14/2020 1040   BILITOT 0.5 07/30/2019 1010   BILITOT 0.33 08/29/2017 0838       RADIOGRAPHIC STUDIES: I have personally reviewed the radiological images as listed and agreed with the findings in the report. CT LUMBAR SPINE W CONTRAST  Result Date: 01/08/2020 CLINICAL DATA:  Significant anterior right leg pain from the hip to the knee for the past 2 months. Chronic low back pain. Prior lumbar fusion, most recently in 2018. EXAM: LUMBAR MYELOGRAM CT LUMBAR MYELOGRAM FLUOROSCOPY TIME:  Radiation Exposure Index (as provided by the fluoroscopic device): 10.0 mGy Fluoroscopy Time:  40 seconds Number of Acquired Images:  16 PROCEDURE: After thorough discussion of risks and benefits of the procedure including bleeding, infection, injury to nerves, blood vessels, adjacent structures as well as headache and CSF leak, written and oral informed consent was obtained. Consent was obtained by Dr. Fabiola Backer. Time out form was completed. Patient was positioned prone on the fluoroscopy table. Local anesthesia was provided with 1% lidocaine  without epinephrine after prepped and draped in the usual sterile fashion. Puncture was performed at L1-L2 using a 3 1/2 inch 22-gauge spinal needle via right interlaminar approach. Using a single pass through the dura, the needle was placed within the thecal sac, with return of clear CSF. 15 mL of Isovue M-200 was injected into the thecal sac, with normal opacification of the nerve roots and cauda equina consistent with free flow within the subarachnoid space. I personally performed the lumbar puncture and administered the intrathecal contrast. I also personally supervised acquisition of the myelogram images. TECHNIQUE: Contiguous axial images were obtained through the lumbar spine after the intrathecal infusion of contrast. Coronal and sagittal reconstructions were obtained of the axial image sets. COMPARISON:  CT abdomen pelvis dated July 30, 2019. MRI lumbar spine dated September 19, 2016. FINDINGS: LUMBAR MYELOGRAM FINDINGS: Prior L3-L5 posterior fusion with interbody graft at L3-L4. Chronic mild L5 compression deformity. 5 mm anterolisthesis at L3-L4 increases to 8 mm with standing, flexion, and extension. 4 mm anterolisthesis at L4-L5 increases to 6 mm with standing and flexion. No significant ventral extradural defect. Initial contrast block at L4-L5 with the patient prone, that resolved after standing. No nerve root effacement. CT LUMBAR MYELOGRAM FINDINGS: Segmentation: Standard. Alignment: Unchanged 5 mm anterolisthesis at L3-L4 and 4 mm anterolisthesis at L4-L5. Vertebrae: Prior L3-L5 posterior fusion with L3-L4 interbody graft. Unchanged subsidence of the interbody graft without interbody fusion. Chronic lucency around the bilateral L3, bilateral L5, and right L4 pedicle screws. Abandoned left pedicle screw. Chronic mild L5 superior endplate compression deformity. No acute fracture or focal pathologic process. Conus medullaris and cauda equina: Conus extends to the L1 level. Conus and cauda equina  appear normal. Paraspinal and other soft tissues: Mild aortoiliac atherosclerotic calcification. Disc levels: T12-L1:  Negative. L1-L2:  Negative. L2-L3: Unchanged tiny shallow broad-based posterior disc protrusion and mild bilateral facet arthropathy. No stenosis. L3-L4: Prior posterior decompression and PLIF. Residual mild right neuroforaminal stenosis due to endplate spurring. No spinal canal or left neuroforaminal stenosis. L4-L5: Prior posterior decompression and fusion. Unchanged chronic ossification posterior to the thecal sac at L4 with resultant mild spinal canal and left lateral recess stenosis. No neuroforaminal stenosis. L5-S1:  Prior posterior decompression.  No stenosis. IMPRESSION: 1. Prior L3-L5 fusion with loosening of the screws and pseudoarthrosis at L3-L4. Dynamic instability at L3-L4 and L4-L5. 2. Residual mild right neuroforaminal stenosis at L3-L4 that could worsen with dynamic maneuvers and may affect the exiting right L3 nerve root. 3. Residual mild spinal canal and left lateral recess stenosis at L4 due to chronic ossification posterior to the thecal sac. Electronically Signed   By: Titus Dubin M.D.   On: 01/08/2020 15:35   DG MYELOGRAPHY LUMBAR INJ LUMBOSACRAL  Result Date: 01/08/2020 CLINICAL DATA:  Significant anterior right leg pain from the hip to the knee for the past 2 months. Chronic low back pain. Prior lumbar fusion, most recently in 2018. EXAM: LUMBAR MYELOGRAM CT LUMBAR MYELOGRAM FLUOROSCOPY TIME:  Radiation Exposure Index (as provided by the fluoroscopic device): 10.0 mGy Fluoroscopy Time:  40 seconds Number of Acquired Images:  16 PROCEDURE: After thorough discussion of risks and benefits of the procedure including bleeding, infection, injury to nerves, blood vessels, adjacent structures as well as headache and CSF leak, written and oral informed consent was obtained. Consent was obtained by Dr. Fabiola Backer. Time out form was completed. Patient was positioned prone  on the fluoroscopy table. Local anesthesia was provided with 1% lidocaine without epinephrine after prepped and draped in the usual sterile fashion. Puncture was performed at L1-L2 using a 3 1/2 inch 22-gauge spinal needle via right interlaminar approach. Using a single pass through the dura, the needle was placed within the thecal sac, with return of clear CSF. 15 mL of Isovue M-200 was injected into the thecal sac, with normal opacification of the nerve roots and cauda equina consistent with free flow within the subarachnoid space. I personally performed the lumbar puncture and administered the intrathecal contrast. I also personally supervised acquisition of the myelogram images. TECHNIQUE: Contiguous axial images were obtained through the lumbar spine after the intrathecal infusion of contrast. Coronal and sagittal reconstructions were obtained of the axial image sets. COMPARISON:  CT abdomen pelvis dated July 30, 2019. MRI lumbar spine dated September 19, 2016. FINDINGS: LUMBAR MYELOGRAM FINDINGS: Prior L3-L5 posterior fusion with interbody graft at L3-L4. Chronic mild L5 compression deformity. 5 mm anterolisthesis at L3-L4 increases to 8 mm with standing, flexion, and extension. 4 mm anterolisthesis at L4-L5 increases to 6 mm with standing and flexion. No significant ventral extradural defect. Initial contrast block at L4-L5 with the patient prone, that resolved after standing. No nerve root effacement. CT LUMBAR MYELOGRAM FINDINGS: Segmentation: Standard. Alignment: Unchanged 5 mm anterolisthesis at L3-L4 and 4 mm anterolisthesis at L4-L5. Vertebrae: Prior L3-L5 posterior fusion with L3-L4 interbody graft. Unchanged subsidence of the interbody graft without interbody fusion. Chronic lucency around the bilateral L3, bilateral L5, and right L4 pedicle screws. Abandoned left pedicle screw. Chronic mild L5 superior endplate compression deformity. No acute fracture or focal pathologic process. Conus medullaris  and cauda equina: Conus extends to the L1 level. Conus and cauda equina appear normal. Paraspinal and other soft tissues: Mild aortoiliac atherosclerotic calcification. Disc levels: T12-L1:  Negative. L1-L2:  Negative. L2-L3: Unchanged tiny shallow broad-based posterior disc protrusion and mild bilateral facet arthropathy. No stenosis. L3-L4: Prior posterior decompression and PLIF. Residual mild right neuroforaminal stenosis due to endplate spurring. No spinal canal or left neuroforaminal stenosis. L4-L5: Prior posterior decompression and fusion. Unchanged chronic ossification posterior to the thecal sac at L4 with resultant mild spinal canal and left lateral recess stenosis. No neuroforaminal stenosis. L5-S1:  Prior posterior decompression.  No stenosis. IMPRESSION: 1. Prior L3-L5 fusion with loosening of the screws and pseudoarthrosis at L3-L4. Dynamic instability at L3-L4 and L4-L5. 2. Residual mild right neuroforaminal stenosis at L3-L4 that could worsen with dynamic maneuvers and may affect the exiting right L3 nerve root. 3. Residual mild spinal canal and left lateral recess stenosis at L4 due to chronic ossification posterior to the thecal sac. Electronically Signed   By: Titus Dubin M.D.   On: 01/08/2020 15:35

## 2020-01-14 NOTE — Assessment & Plan Note (Signed)
She has chronic back pain and leg pain She is undergoing evaluation by orthopedic surgery I will defer to them for further management

## 2020-01-14 NOTE — Patient Instructions (Signed)

## 2020-01-15 ENCOUNTER — Telehealth: Payer: Self-pay | Admitting: Hematology and Oncology

## 2020-01-15 NOTE — Telephone Encounter (Signed)
Scheduled per 3/29 sch msg. Called and spoke with pt, confirmed 6/4 appt

## 2020-03-10 ENCOUNTER — Other Ambulatory Visit: Payer: Medicare HMO

## 2020-03-10 ENCOUNTER — Ambulatory Visit: Payer: Medicare HMO | Admitting: Hematology and Oncology

## 2020-03-21 ENCOUNTER — Encounter: Payer: Self-pay | Admitting: Hematology and Oncology

## 2020-03-21 ENCOUNTER — Inpatient Hospital Stay: Payer: Medicare HMO

## 2020-03-21 ENCOUNTER — Other Ambulatory Visit: Payer: Self-pay

## 2020-03-21 ENCOUNTER — Telehealth: Payer: Self-pay | Admitting: Hematology and Oncology

## 2020-03-21 ENCOUNTER — Inpatient Hospital Stay: Payer: Medicare HMO | Attending: Hematology and Oncology

## 2020-03-21 ENCOUNTER — Inpatient Hospital Stay: Payer: Medicare HMO | Admitting: Hematology and Oncology

## 2020-03-21 DIAGNOSIS — C8238 Follicular lymphoma grade IIIa, lymph nodes of multiple sites: Secondary | ICD-10-CM

## 2020-03-21 DIAGNOSIS — I482 Chronic atrial fibrillation, unspecified: Secondary | ICD-10-CM

## 2020-03-21 DIAGNOSIS — Z7901 Long term (current) use of anticoagulants: Secondary | ICD-10-CM | POA: Insufficient documentation

## 2020-03-21 DIAGNOSIS — Z452 Encounter for adjustment and management of vascular access device: Secondary | ICD-10-CM | POA: Diagnosis not present

## 2020-03-21 DIAGNOSIS — N183 Chronic kidney disease, stage 3 unspecified: Secondary | ICD-10-CM

## 2020-03-21 LAB — CBC WITH DIFFERENTIAL/PLATELET
Abs Immature Granulocytes: 0.01 10*3/uL (ref 0.00–0.07)
Basophils Absolute: 0 10*3/uL (ref 0.0–0.1)
Basophils Relative: 1 %
Eosinophils Absolute: 0.2 10*3/uL (ref 0.0–0.5)
Eosinophils Relative: 4 %
HCT: 40 % (ref 36.0–46.0)
Hemoglobin: 13 g/dL (ref 12.0–15.0)
Immature Granulocytes: 0 %
Lymphocytes Relative: 8 %
Lymphs Abs: 0.4 10*3/uL — ABNORMAL LOW (ref 0.7–4.0)
MCH: 30.8 pg (ref 26.0–34.0)
MCHC: 32.5 g/dL (ref 30.0–36.0)
MCV: 94.8 fL (ref 80.0–100.0)
Monocytes Absolute: 0.7 10*3/uL (ref 0.1–1.0)
Monocytes Relative: 14 %
Neutro Abs: 3.6 10*3/uL (ref 1.7–7.7)
Neutrophils Relative %: 73 %
Platelets: 172 10*3/uL (ref 150–400)
RBC: 4.22 MIL/uL (ref 3.87–5.11)
RDW: 13.5 % (ref 11.5–15.5)
WBC: 4.9 10*3/uL (ref 4.0–10.5)
nRBC: 0 % (ref 0.0–0.2)

## 2020-03-21 LAB — COMPREHENSIVE METABOLIC PANEL
ALT: 14 U/L (ref 0–44)
AST: 18 U/L (ref 15–41)
Albumin: 3.9 g/dL (ref 3.5–5.0)
Alkaline Phosphatase: 81 U/L (ref 38–126)
Anion gap: 11 (ref 5–15)
BUN: 24 mg/dL — ABNORMAL HIGH (ref 8–23)
CO2: 22 mmol/L (ref 22–32)
Calcium: 8.8 mg/dL — ABNORMAL LOW (ref 8.9–10.3)
Chloride: 108 mmol/L (ref 98–111)
Creatinine, Ser: 1.1 mg/dL — ABNORMAL HIGH (ref 0.44–1.00)
GFR calc Af Amer: 56 mL/min — ABNORMAL LOW (ref >60)
GFR calc non Af Amer: 48 mL/min — ABNORMAL LOW (ref >60)
Glucose, Bld: 84 mg/dL (ref 70–99)
Potassium: 4.2 mmol/L (ref 3.5–5.1)
Sodium: 141 mmol/L (ref 135–145)
Total Bilirubin: 0.9 mg/dL (ref 0.3–1.2)
Total Protein: 6.3 g/dL — ABNORMAL LOW (ref 6.5–8.1)

## 2020-03-21 MED ORDER — SODIUM CHLORIDE 0.9% FLUSH
10.0000 mL | Freq: Once | INTRAVENOUS | Status: AC
  Administered 2020-03-21: 10 mL
  Filled 2020-03-21: qty 10

## 2020-03-21 MED ORDER — HEPARIN SOD (PORK) LOCK FLUSH 100 UNIT/ML IV SOLN
500.0000 [IU] | Freq: Once | INTRAVENOUS | Status: AC
  Administered 2020-03-21: 500 [IU]
  Filled 2020-03-21: qty 5

## 2020-03-21 NOTE — Assessment & Plan Note (Signed)
She has chronic kidney disease stage III.  This is likely related to cardiac issues Continue medical management

## 2020-03-21 NOTE — Assessment & Plan Note (Signed)
She has no clinical signs of congestive heart failure on exam She will continue chronic anticoagulation therapy.

## 2020-03-21 NOTE — Assessment & Plan Note (Signed)
Her blood work and recent imaging studies showed no evidence of cancer recurrence Her examination is benign She is at high risk of recurrent cancer given multiple relapse I will see her again in 2 months for blood work, port flush maintenance as well as examination

## 2020-03-21 NOTE — Progress Notes (Signed)
[facility] OFFICE PROGRESS NOTE  Patient Care Team: Domenick Bookbinder, MD as PCP - General (Internal Medicine) [name], MD as PCP - Cardiology (Cardiology) Carola Frost, RN as Registered Nurse (Medical Oncology) [name], MD as Attending Physician (Internal Medicine)  ASSESSMENT & PLAN:  Grade 3a follicular lymphoma of lymph nodes of multiple regions [facility]) Her blood work and recent imaging studies showed no evidence of cancer recurrence Her examination is benign She is at high risk of recurrent cancer given multiple relapse I will see her again in 2 months for blood work, port flush maintenance as well as examination  Chronic kidney disease, stage III (moderate) ([place]) She has chronic kidney disease stage III.  This is likely related to cardiac issues Continue medical management  Chronic atrial fibrillation She has no clinical signs of congestive heart failure on exam She will continue chronic anticoagulation therapy.   No orders of the defined types were placed in this encounter.   All questions were answered. The patient knows to call the clinic with any problems, questions or concerns. The total time spent in the appointment was 20 minutes encounter with patients including review of chart and various tests results, discussions about plan of care and coordination of care plan   [name], MD 03/21/2020 12:04 PM  INTERVAL HISTORY: Please see below for problem oriented charting. She returns for further follow-up She is doing well No bleeding complications from anticoagulation therapy She has intermittent bilateral knee pain and has seen orthopedic surgeon for management No recent exacerbation of congestive heart failure No new lymphadenopathy or infections  SUMMARY OF ONCOLOGIC HISTORY: Oncology History  Grade 3a follicular lymphoma of lymph nodes of multiple regions ([place])  01/21/2015 Surgery   She underwent excisional lymph node biopsy that  came back follicular lymphoma grade 3   01/21/2015 Pathology Results   Accession: [id number] biopsy confirmed follicular lymphoma   4/65/0354 Imaging   Echocardiogram showed ejection fraction of 55-60%   02/11/2015 Imaging    PET CT scan show possible splenic involvement and diffuse lymphadenopathy throughout   02/11/2015 Bone Marrow Biopsy    bone marrow biopsy was performed and is involved by lymphoma with translocation of igH/BCL2   02/13/2015 Procedure   She had port placement.   02/17/2015 - 06/02/2015 Chemotherapy   She received R-CHOP chemo x 6   02/17/2015 Adverse Reaction   She had mild infusion reaction with cycle 1 of treatment.   04/18/2015 Imaging   PET CT scan showed near complete response to treatment.   04/22/2015 Adverse Reaction   Vincristine dose was reduced by 50% due to neuropathy from cycle 4 onwards   06/25/2015 - 07/06/2015 Hospital Admission   She was hospitalized for recent sepsis/bacteremia and a fib with RVR   07/11/2015 Imaging   repeat PEt scan showed complete response to Rx   07/14/2015 - 06/16/2017 Chemotherapy   She received maintenance Rituximab every 60 days   12/15/2015 Imaging   PET CT showed no evidence of cancer recurrence   06/15/2016 PET scan   No evidence of active lymphoma on skullbase to thigh FDG PET scan. Small LEFT periaortic retroperitoneal lymph nodes without significant metabolic activity ( Deauville 1). No change from prior   03/24/2017 Imaging   1. Borderline enlarged abdominal retroperitoneal lymph nodes, stable. No new adenopathy in the chest, abdomen or pelvis. 2. Aortic atherosclerosis (ICD10-170.0). 3. Enlarged pulmonary arteries, indicative of pulmonary arterial hypertension.   09/14/2017 PET scan   Stable exam.  No evidence of active lymphoma or other acute findings. (Deauville score 1)   08/08/2018 PET scan   1. Evidence of progressive/recurrent disease, as evidenced by new hypermetabolic nodes and subcutaneous nodule/nodes  throughout left chest and abdomen. (Deauville 5). 2. Likely physiologic right nasopharyngeal hypermetabolism. Recommend attention on follow-up. 3. Incidental findings, including pulmonary artery enlargement, suggesting pulmonary arterial hypertension, stable left lower lobe pulmonary nodule, and aortic Atherosclerosis (ICD10-I70.0).   08/29/2018 Initial Biopsy   Soft tissue simple excision left back: Follicular lymphoma grade 1-2 positive for CD20, PAX5, bcl-6, and bcl-2.  Ki-67 30%   09/11/2018 - 02/20/2019 Chemotherapy   The patient had Bendamustine and Rituximab   12/13/2018 Imaging   1. Response to therapy, as evidenced by resolution of left chest wall nodularity and decrease in size of small left axillary nodes. 2. No residual soft tissue thickening at the site of hypermetabolism along the posterior left eleventh rib. Resolution of adjacent subcutaneous nodularity. 3. No new or progressive disease identified. 4.  Aortic Atherosclerosis (ICD10-I70.0). 5. Bilateral pulmonary nodules, similar. 6. Trace air within the urinary bladder could be iatrogenic. Possible pericystic edema. Correlate with symptoms of cystitis and recent instrumentation.   07/30/2019 Imaging   Ct chest, abdomen and pelvis No findings suspicious for active lymphoma in the chest, abdomen, or pelvis.   Small bilateral pulmonary nodules, unchanged, benign.   Again noted is trace nondependent gas within the bladder. Correlate for recent intervention.     REVIEW OF SYSTEMS:   Constitutional: Denies fevers, chills or abnormal weight loss Eyes: Denies blurriness of vision Ears, nose, mouth, throat, and face: Denies mucositis or sore throat Respiratory: Denies cough, dyspnea or wheezes Cardiovascular: Denies palpitation, chest discomfort or lower extremity swelling Gastrointestinal:  Denies nausea, heartburn or change in bowel habits Skin: Denies abnormal skin rashes Lymphatics: Denies new lymphadenopathy or easy  bruising Neurological:Denies numbness, tingling or new weaknesses Behavioral/Psych: Mood is stable, no new changes  All other systems were reviewed with the patient and are negative.  I have reviewed the past medical history, past surgical history, social history and family history with the patient and they are unchanged from previous note.  ALLERGIES:  is allergic to amiodarone.  MEDICATIONS:  Current Outpatient Medications  Medication Sig Dispense Refill  . acetaminophen (TYLENOL) 500 MG tablet Take 500 mg by mouth every 6 (six) hours as needed for moderate pain or headache.    Marland Kitchen apixaban (ELIQUIS) 5 MG TABS tablet Take 5 mg by mouth 2 (two) times daily.     . bisacodyl (DULCOLAX) 5 MG EC tablet Take 5 mg by mouth daily as needed for moderate constipation.    . diclofenac sodium (VOLTAREN) 1 % GEL Apply 2 g topically 3 (three) times daily as needed (knee pain). 100 g 0  . levothyroxine (SYNTHROID, LEVOTHROID) 112 MCG tablet Take 112 mcg by mouth daily.   9  . lidocaine-prilocaine (EMLA) cream Apply to affected area once (Patient taking differently: Apply 1 application topically as needed (port access). Apply to affected area once) 30 g 3  . metoprolol succinate (TOPROL-XL) 25 MG 24 hr tablet Take 0.5 tablets (12.5 mg total) by mouth daily. Take with or immediately following a meal. 90 tablet 3  . polyvinyl alcohol (LIQUIFILM TEARS) 1.4 % ophthalmic solution Place 1 drop into both eyes as needed for dry eyes.     No current facility-administered medications for this visit.   Facility-Administered Medications Ordered in Other Visits  Medication Dose Route Frequency Provider Last Rate Last  Admin  . ondansetron (ZOFRAN) 8 mg in sodium chloride 0.9 % 50 mL IVPB   Intravenous Once , , MD      . sodium chloride 0.9 % injection 10 mL  10 mL Intravenous PRN Alvy Bimler, , MD   10 mL at 04/05/17 1315    PHYSICAL EXAMINATION: ECOG PERFORMANCE STATUS: 1 - Symptomatic but completely  ambulatory  Vitals:   03/21/20 1035  BP: 138/63  Pulse: 67  Resp: 18  Temp: 98.1 F (36.7 C)  SpO2: 96%   Filed Weights   03/21/20 1035  Weight: 138 lb 3.2 oz (62.7 kg)    GENERAL:alert, no distress and comfortable SKIN: skin color, texture, turgor are normal, no rashes or significant lesions EYES: normal, Conjunctiva are pink and non-injected, sclera clear OROPHARYNX:no exudate, no erythema and lips, buccal mucosa, and tongue normal  NECK: supple, thyroid normal size, non-tender, without nodularity LYMPH:  no palpable lymphadenopathy in the cervical, axillary or inguinal LUNGS: clear to auscultation and percussion with normal breathing effort HEART: regular rate & rhythm and no murmurs and no lower extremity edema ABDOMEN:abdomen soft, non-tender and normal bowel sounds Musculoskeletal:no cyanosis of digits and no clubbing  NEURO: alert & oriented x 3 with fluent speech, no focal motor/sensory deficits  LABORATORY DATA:  I have reviewed the data as listed    Component Value Date/Time   NA 141 03/21/2020 1012   NA 143 07/16/2019 1132   NA 142 08/29/2017 0838   K 4.2 03/21/2020 1012   K 4.2 08/29/2017 0838   CL 108 03/21/2020 1012   CO2 22 03/21/2020 1012   CO2 23 08/29/2017 0838   GLUCOSE 84 03/21/2020 1012   GLUCOSE 97 08/29/2017 0838   BUN 24 (H) 03/21/2020 1012   BUN 27 07/16/2019 1132   BUN 24.0 08/29/2017 0838   CREATININE 1.10 (H) 03/21/2020 1012   CREATININE 1.51 (H) 07/30/2019 1010   CREATININE 1.0 08/29/2017 0838   CALCIUM 8.8 (L) 03/21/2020 1012   CALCIUM 8.9 08/29/2017 0838   PROT 6.3 (L) 03/21/2020 1012   PROT 6.4 08/29/2017 0838   ALBUMIN 3.9 03/21/2020 1012   ALBUMIN 3.8 08/29/2017 0838   AST 18 03/21/2020 1012   AST 13 (L) 07/30/2019 1010   AST 18 08/29/2017 0838   ALT 14 03/21/2020 1012   ALT 9 07/30/2019 1010   ALT 16 08/29/2017 0838   ALKPHOS 81 03/21/2020 1012   ALKPHOS 93 08/29/2017 0838   BILITOT 0.9 03/21/2020 1012   BILITOT 0.5  07/30/2019 1010   BILITOT 0.33 08/29/2017 0838   GFRNONAA 48 (L) 03/21/2020 1012   GFRNONAA 33 (L) 07/30/2019 1010   GFRAA 56 (L) 03/21/2020 1012   GFRAA 39 (L) 07/30/2019 1010    No results found for: SPEP, UPEP  Lab Results  Component Value Date   WBC 4.9 03/21/2020   NEUTROABS 3.6 03/21/2020   HGB 13.0 03/21/2020   HCT 40.0 03/21/2020   MCV 94.8 03/21/2020   PLT 172 03/21/2020      Chemistry      Component Value Date/Time   NA 141 03/21/2020 1012   NA 143 07/16/2019 1132   NA 142 08/29/2017 0838   K 4.2 03/21/2020 1012   K 4.2 08/29/2017 0838   CL 108 03/21/2020 1012   CO2 22 03/21/2020 1012   CO2 23 08/29/2017 0838   BUN 24 (H) 03/21/2020 1012   BUN 27 07/16/2019 1132   BUN 24.0 08/29/2017 0838   CREATININE 1.10 (H) 03/21/2020 1012  CREATININE 1.51 (H) 07/30/2019 1010   CREATININE 1.0 08/29/2017 0838      Component Value Date/Time   CALCIUM 8.8 (L) 03/21/2020 1012   CALCIUM 8.9 08/29/2017 0838   ALKPHOS 81 03/21/2020 1012   ALKPHOS 93 08/29/2017 0838   AST 18 03/21/2020 1012   AST 13 (L) 07/30/2019 1010   AST 18 08/29/2017 0838   ALT 14 03/21/2020 1012   ALT 9 07/30/2019 1010   ALT 16 08/29/2017 0838   BILITOT 0.9 03/21/2020 1012   BILITOT 0.5 07/30/2019 1010   BILITOT 0.33 08/29/2017 2122

## 2020-03-21 NOTE — Telephone Encounter (Signed)
Scheduled per 6/4 sch message. Pt requested the week of 8/9. Pt aware of appts.

## 2020-05-26 ENCOUNTER — Inpatient Hospital Stay: Payer: Medicare HMO

## 2020-05-26 ENCOUNTER — Telehealth: Payer: Self-pay | Admitting: Hematology and Oncology

## 2020-05-26 ENCOUNTER — Inpatient Hospital Stay: Payer: Medicare HMO | Attending: Hematology and Oncology | Admitting: Hematology and Oncology

## 2020-05-26 ENCOUNTER — Other Ambulatory Visit: Payer: Self-pay | Admitting: Hematology and Oncology

## 2020-05-26 ENCOUNTER — Encounter: Payer: Self-pay | Admitting: Hematology and Oncology

## 2020-05-26 ENCOUNTER — Other Ambulatory Visit: Payer: Self-pay

## 2020-05-26 DIAGNOSIS — Z7901 Long term (current) use of anticoagulants: Secondary | ICD-10-CM | POA: Insufficient documentation

## 2020-05-26 DIAGNOSIS — Z791 Long term (current) use of non-steroidal anti-inflammatories (NSAID): Secondary | ICD-10-CM | POA: Diagnosis not present

## 2020-05-26 DIAGNOSIS — N183 Chronic kidney disease, stage 3 unspecified: Secondary | ICD-10-CM

## 2020-05-26 DIAGNOSIS — C8238 Follicular lymphoma grade IIIa, lymph nodes of multiple sites: Secondary | ICD-10-CM

## 2020-05-26 DIAGNOSIS — Z452 Encounter for adjustment and management of vascular access device: Secondary | ICD-10-CM | POA: Insufficient documentation

## 2020-05-26 DIAGNOSIS — I482 Chronic atrial fibrillation, unspecified: Secondary | ICD-10-CM | POA: Diagnosis not present

## 2020-05-26 DIAGNOSIS — Z79899 Other long term (current) drug therapy: Secondary | ICD-10-CM | POA: Diagnosis not present

## 2020-05-26 DIAGNOSIS — Z9221 Personal history of antineoplastic chemotherapy: Secondary | ICD-10-CM | POA: Insufficient documentation

## 2020-05-26 LAB — COMPREHENSIVE METABOLIC PANEL
ALT: 11 U/L (ref 0–44)
AST: 14 U/L — ABNORMAL LOW (ref 15–41)
Albumin: 4 g/dL (ref 3.5–5.0)
Alkaline Phosphatase: 84 U/L (ref 38–126)
Anion gap: 8 (ref 5–15)
BUN: 24 mg/dL — ABNORMAL HIGH (ref 8–23)
CO2: 24 mmol/L (ref 22–32)
Calcium: 9.7 mg/dL (ref 8.9–10.3)
Chloride: 107 mmol/L (ref 98–111)
Creatinine, Ser: 1.07 mg/dL — ABNORMAL HIGH (ref 0.44–1.00)
GFR calc Af Amer: 58 mL/min — ABNORMAL LOW (ref >60)
GFR calc non Af Amer: 50 mL/min — ABNORMAL LOW (ref >60)
Glucose, Bld: 83 mg/dL (ref 70–99)
Potassium: 4.7 mmol/L (ref 3.5–5.1)
Sodium: 139 mmol/L (ref 135–145)
Total Bilirubin: 0.6 mg/dL (ref 0.3–1.2)
Total Protein: 6.5 g/dL (ref 6.5–8.1)

## 2020-05-26 LAB — CBC WITH DIFFERENTIAL/PLATELET
Abs Immature Granulocytes: 0.02 10*3/uL (ref 0.00–0.07)
Basophils Absolute: 0 10*3/uL (ref 0.0–0.1)
Basophils Relative: 0 %
Eosinophils Absolute: 0.1 10*3/uL (ref 0.0–0.5)
Eosinophils Relative: 2 %
HCT: 40.4 % (ref 36.0–46.0)
Hemoglobin: 13.2 g/dL (ref 12.0–15.0)
Immature Granulocytes: 0 %
Lymphocytes Relative: 7 %
Lymphs Abs: 0.4 10*3/uL — ABNORMAL LOW (ref 0.7–4.0)
MCH: 30.7 pg (ref 26.0–34.0)
MCHC: 32.7 g/dL (ref 30.0–36.0)
MCV: 94 fL (ref 80.0–100.0)
Monocytes Absolute: 0.6 10*3/uL (ref 0.1–1.0)
Monocytes Relative: 11 %
Neutro Abs: 4.4 10*3/uL (ref 1.7–7.7)
Neutrophils Relative %: 80 %
Platelets: 177 10*3/uL (ref 150–400)
RBC: 4.3 MIL/uL (ref 3.87–5.11)
RDW: 12.9 % (ref 11.5–15.5)
WBC: 5.6 10*3/uL (ref 4.0–10.5)
nRBC: 0 % (ref 0.0–0.2)

## 2020-05-26 MED ORDER — HEPARIN SOD (PORK) LOCK FLUSH 100 UNIT/ML IV SOLN
500.0000 [IU] | Freq: Once | INTRAVENOUS | Status: AC
  Administered 2020-05-26: 500 [IU]
  Filled 2020-05-26: qty 5

## 2020-05-26 MED ORDER — SODIUM CHLORIDE 0.9% FLUSH
10.0000 mL | Freq: Once | INTRAVENOUS | Status: AC
  Administered 2020-05-26: 10 mL
  Filled 2020-05-26: qty 10

## 2020-05-26 NOTE — Patient Instructions (Signed)

## 2020-05-26 NOTE — Telephone Encounter (Signed)
Scheduled appts per 8/9 sch msg. Gave pt a print out of AVS. 

## 2020-05-26 NOTE — Progress Notes (Signed)
[facility] OFFICE PROGRESS NOTE  Patient Care Team: Eyo, Unwana, MD as PCP - General (Internal Medicine) Nelson, Katarina H, MD as PCP - Cardiology (Cardiology) Dixon, Gina B, RN as Registered Nurse (Medical Oncology) Sekhon, Manharprett, MD as Attending Physician (Internal Medicine)  ASSESSMENT & PLAN:  Grade 3a follicular lymphoma of lymph nodes of multiple regions ([facility]) Her blood work and recent imaging studies showed no evidence of cancer recurrence Her examination is benign I do not recommend routine surveillance imaging study I will see her every 3 months for further follow-up She is educated to watch out for signs and symptoms of cancer recurrence We discussed port removal and she is in agreement  She is instructed to stop anticoagulation therapy for 48 hours before port removal  Chronic atrial fibrillation She is in normal sinus rhythm She will continue medical management  Chronic kidney disease, stage III (moderate) ([facility]) She has chronic kidney disease stage III.  This is stable Continue medical management   No orders of the defined types were placed in this encounter.   All questions were answered. The patient knows to call the clinic with any problems, questions or concerns. The total time spent in the appointment was 20 minutes encounter with patients including review of chart and various tests results, discussions about plan of care and coordination of care plan    , MD 05/26/2020 11:33 AM  INTERVAL HISTORY: Please see below for problem oriented charting. She returns for further follow-up No recent infection, fever or chills No new lymphadenopathy Her appetite is fair No recent abnormal night sweats No new back pain The patient denies any recent signs or symptoms of bleeding such as spontaneous epistaxis, hematuria or hematochezia.   SUMMARY OF ONCOLOGIC HISTORY: Oncology History  Grade 3a follicular lymphoma of lymph nodes of  multiple regions ([facility])  01/21/2015 Surgery   She underwent excisional lymph node biopsy that came back follicular lymphoma grade 3   01/21/2015 Pathology Results   Accession: [id number] biopsy confirmed follicular lymphoma   02/07/2015 Imaging   Echocardiogram showed ejection fraction of 55-60%   02/11/2015 Imaging    PET CT scan show possible splenic involvement and diffuse lymphadenopathy throughout   02/11/2015 Bone Marrow Biopsy    bone marrow biopsy was performed and is involved by lymphoma with translocation of igH/BCL2   02/13/2015 Procedure   She had port placement.   02/17/2015 - 06/02/2015 Chemotherapy   She received R-CHOP chemo x 6   02/17/2015 Adverse Reaction   She had mild infusion reaction with cycle 1 of treatment.   04/18/2015 Imaging   PET CT scan showed near complete response to treatment.   04/22/2015 Adverse Reaction   Vincristine dose was reduced by 50% due to neuropathy from cycle 4 onwards   06/25/2015 - 07/06/2015 Hospital Admission   She was hospitalized for recent sepsis/bacteremia and a fib with RVR   07/11/2015 Imaging   repeat PEt scan showed complete response to Rx   07/14/2015 - 06/16/2017 Chemotherapy   She received maintenance Rituximab every 60 days   12/15/2015 Imaging   PET CT showed no evidence of cancer recurrence   06/15/2016 PET scan   No evidence of active lymphoma on skullbase to thigh FDG PET scan. Small LEFT periaortic retroperitoneal lymph nodes without significant metabolic activity ( Deauville 1). No change from prior   03/24/2017 Imaging   1. Borderline enlarged abdominal retroperitoneal lymph nodes, stable. No new adenopathy in the chest, abdomen or pelvis. 2. Aortic   atherosclerosis (ICD10-170.0). 3. Enlarged pulmonary arteries, indicative of pulmonary arterial hypertension.   09/14/2017 PET scan   Stable exam. No evidence of active lymphoma or other acute findings. (Deauville score 1)   08/08/2018 PET scan   1. Evidence of  progressive/recurrent disease, as evidenced by new hypermetabolic nodes and subcutaneous nodule/nodes throughout left chest and abdomen. (Deauville 5). 2. Likely physiologic right nasopharyngeal hypermetabolism. Recommend attention on follow-up. 3. Incidental findings, including pulmonary artery enlargement, suggesting pulmonary arterial hypertension, stable left lower lobe pulmonary nodule, and aortic Atherosclerosis (ICD10-I70.0).   08/29/2018 Initial Biopsy   Soft tissue simple excision left back: Follicular lymphoma grade 1-2 positive for CD20, PAX5, bcl-6, and bcl-2.  Ki-67 30%   09/11/2018 - 02/20/2019 Chemotherapy   The patient had Bendamustine and Rituximab   12/13/2018 Imaging   1. Response to therapy, as evidenced by resolution of left chest wall nodularity and decrease in size of small left axillary nodes. 2. No residual soft tissue thickening at the site of hypermetabolism along the posterior left eleventh rib. Resolution of adjacent subcutaneous nodularity. 3. No new or progressive disease identified. 4.  Aortic Atherosclerosis (ICD10-I70.0). 5. Bilateral pulmonary nodules, similar. 6. Trace air within the urinary bladder could be iatrogenic. Possible pericystic edema. Correlate with symptoms of cystitis and recent instrumentation.   07/30/2019 Imaging   Ct chest, abdomen and pelvis No findings suspicious for active lymphoma in the chest, abdomen, or pelvis.   Small bilateral pulmonary nodules, unchanged, benign.   Again noted is trace nondependent gas within the bladder. Correlate for recent intervention.     REVIEW OF SYSTEMS:   Constitutional: Denies fevers, chills or abnormal weight loss Eyes: Denies blurriness of vision Ears, nose, mouth, throat, and face: Denies mucositis or sore throat Respiratory: Denies cough, dyspnea or wheezes Cardiovascular: Denies palpitation, chest discomfort or lower extremity swelling Gastrointestinal:  Denies nausea, heartburn or change  in bowel habits Skin: Denies abnormal skin rashes Lymphatics: Denies new lymphadenopathy or easy bruising Neurological:Denies numbness, tingling or new weaknesses Behavioral/Psych: Mood is stable, no new changes  All other systems were reviewed with the patient and are negative.  I have reviewed the past medical history, past surgical history, social history and family history with the patient and they are unchanged from previous note.  ALLERGIES:  is allergic to amiodarone.  MEDICATIONS:  Current Outpatient Medications  Medication Sig Dispense Refill  . acetaminophen (TYLENOL) 500 MG tablet Take 500 mg by mouth every 6 (six) hours as needed for moderate pain or headache.    Marland Kitchen apixaban (ELIQUIS) 5 MG TABS tablet Take 5 mg by mouth 2 (two) times daily.     . bisacodyl (DULCOLAX) 5 MG EC tablet Take 5 mg by mouth daily as needed for moderate constipation.    . diclofenac sodium (VOLTAREN) 1 % GEL Apply 2 g topically 3 (three) times daily as needed (knee pain). 100 g 0  . levothyroxine (SYNTHROID, LEVOTHROID) 112 MCG tablet Take 112 mcg by mouth daily.   9  . metoprolol succinate (TOPROL-XL) 25 MG 24 hr tablet Take 0.5 tablets (12.5 mg total) by mouth daily. Take with or immediately following a meal. 90 tablet 3  . polyvinyl alcohol (LIQUIFILM TEARS) 1.4 % ophthalmic solution Place 1 drop into both eyes as needed for dry eyes.     No current facility-administered medications for this visit.   Facility-Administered Medications Ordered in Other Visits  Medication Dose Route Frequency Provider Last Rate Last Admin  . ondansetron (ZOFRAN) 8 mg in sodium  chloride 0.9 % 50 mL IVPB   Intravenous Once Kamarius Buckbee, MD      . sodium chloride 0.9 % injection 10 mL  10 mL Intravenous PRN Alvy Bimler, Fidelis Loth, MD   10 mL at 04/05/17 1315    PHYSICAL EXAMINATION: ECOG PERFORMANCE STATUS: 0 - Asymptomatic  Vitals:   05/26/20 1118  BP: (!) 144/80  Pulse: 69  Resp: 18  Temp: 98.3 F (36.8 C)  SpO2: 99%    Filed Weights   05/26/20 1118  Weight: 137 lb 6.4 oz (62.3 kg)    GENERAL:alert, no distress and comfortable SKIN: skin color, texture, turgor are normal, no rashes or significant lesions EYES: normal, Conjunctiva are pink and non-injected, sclera clear OROPHARYNX:no exudate, no erythema and lips, buccal mucosa, and tongue normal  NECK: supple, thyroid normal size, non-tender, without nodularity LYMPH:  no palpable lymphadenopathy in the cervical, axillary or inguinal LUNGS: clear to auscultation and percussion with normal breathing effort HEART: regular rate & rhythm and no murmurs and no lower extremity edema ABDOMEN:abdomen soft, non-tender and normal bowel sounds Musculoskeletal:no cyanosis of digits and no clubbing  NEURO: alert & oriented x 3 with fluent speech, no focal motor/sensory deficits  LABORATORY DATA:  I have reviewed the data as listed    Component Value Date/Time   NA 141 03/21/2020 1012   NA 143 07/16/2019 1132   NA 142 08/29/2017 0838   K 4.2 03/21/2020 1012   K 4.2 08/29/2017 0838   CL 108 03/21/2020 1012   CO2 22 03/21/2020 1012   CO2 23 08/29/2017 0838   GLUCOSE 84 03/21/2020 1012   GLUCOSE 97 08/29/2017 0838   BUN 24 (H) 03/21/2020 1012   BUN 27 07/16/2019 1132   BUN 24.0 08/29/2017 0838   CREATININE 1.10 (H) 03/21/2020 1012   CREATININE 1.51 (H) 07/30/2019 1010   CREATININE 1.0 08/29/2017 0838   CALCIUM 8.8 (L) 03/21/2020 1012   CALCIUM 8.9 08/29/2017 0838   PROT 6.3 (L) 03/21/2020 1012   PROT 6.4 08/29/2017 0838   ALBUMIN 3.9 03/21/2020 1012   ALBUMIN 3.8 08/29/2017 0838   AST 18 03/21/2020 1012   AST 13 (L) 07/30/2019 1010   AST 18 08/29/2017 0838   ALT 14 03/21/2020 1012   ALT 9 07/30/2019 1010   ALT 16 08/29/2017 0838   ALKPHOS 81 03/21/2020 1012   ALKPHOS 93 08/29/2017 0838   BILITOT 0.9 03/21/2020 1012   BILITOT 0.5 07/30/2019 1010   BILITOT 0.33 08/29/2017 0838   GFRNONAA 48 (L) 03/21/2020 1012   GFRNONAA 33 (L) 07/30/2019  1010   GFRAA 56 (L) 03/21/2020 1012   GFRAA 39 (L) 07/30/2019 1010    No results found for: SPEP, UPEP  Lab Results  Component Value Date   WBC 5.6 05/26/2020   NEUTROABS 4.4 05/26/2020   HGB 13.2 05/26/2020   HCT 40.4 05/26/2020   MCV 94.0 05/26/2020   PLT 177 05/26/2020      Chemistry      Component Value Date/Time   NA 141 03/21/2020 1012   NA 143 07/16/2019 1132   NA 142 08/29/2017 0838   K 4.2 03/21/2020 1012   K 4.2 08/29/2017 0838   CL 108 03/21/2020 1012   CO2 22 03/21/2020 1012   CO2 23 08/29/2017 0838   BUN 24 (H) 03/21/2020 1012   BUN 27 07/16/2019 1132   BUN 24.0 08/29/2017 0838   CREATININE 1.10 (H) 03/21/2020 1012   CREATININE 1.51 (H) 07/30/2019 1010   CREATININE 1.0  08/29/2017 0838      Component Value Date/Time   CALCIUM 8.8 (L) 03/21/2020 1012   CALCIUM 8.9 08/29/2017 0838   ALKPHOS 81 03/21/2020 1012   ALKPHOS 93 08/29/2017 0838   AST 18 03/21/2020 1012   AST 13 (L) 07/30/2019 1010   AST 18 08/29/2017 0838   ALT 14 03/21/2020 1012   ALT 9 07/30/2019 1010   ALT 16 08/29/2017 0838   BILITOT 0.9 03/21/2020 1012   BILITOT 0.5 07/30/2019 1010   BILITOT 0.33 08/29/2017 0838       

## 2020-05-26 NOTE — Assessment & Plan Note (Addendum)
She has chronic kidney disease stage III.  This is stable Continue medical management 

## 2020-05-26 NOTE — Assessment & Plan Note (Addendum)
Her blood work and recent imaging studies showed no evidence of cancer recurrence Her examination is benign I do not recommend routine surveillance imaging study I will see her every 3 months for further follow-up She is educated to watch out for signs and symptoms of cancer recurrence We discussed port removal and she is in agreement  She is instructed to stop anticoagulation therapy for 48 hours before port removal

## 2020-05-26 NOTE — Assessment & Plan Note (Signed)
She is in normal sinus rhythm She will continue medical management

## 2020-05-27 ENCOUNTER — Telehealth: Payer: Self-pay

## 2020-05-27 NOTE — Telephone Encounter (Signed)
Called Dr. Irven [place] office at [facility] and faxed referral to [phone]. Received confirmation.

## 2020-07-09 ENCOUNTER — Telehealth: Payer: Self-pay

## 2020-07-09 NOTE — Telephone Encounter (Signed)
Called back and given below message. She verbalized understanding. She has not had a covid vaccine in the last 2 months. She had first vaccine on 12/01/19 and 2nd vaccine on 12/29/19. She has not had a 3 rd vaccine.

## 2020-07-09 NOTE — Telephone Encounter (Signed)
Can you also ask if she had covid injection on that same arm in the last 2 months?

## 2020-07-09 NOTE — Telephone Encounter (Signed)
She called. She went to the [place] to see Dr. Radford Pax at for left shoulder issue. She had a MRI and got results today. On the MRI it showed enlarged lymph nodes in the shoulder area. She gave the [facility] phone # and fax # . She ask them to share report. She still has her port and was planning on removal in November.  She will wait on the office to get the [place] information.

## 2020-07-10 ENCOUNTER — Other Ambulatory Visit: Payer: Self-pay | Admitting: Hematology and Oncology

## 2020-07-10 ENCOUNTER — Telehealth: Payer: Self-pay

## 2020-07-10 DIAGNOSIS — C8238 Follicular lymphoma grade IIIa, lymph nodes of multiple sites: Secondary | ICD-10-CM

## 2020-07-10 NOTE — Telephone Encounter (Signed)
She called back PET is scheduled on 10/4 at 7 am. That is the earliest appt she could get. Instructed a scheduler would call her regarding appt with Dr. Alvy Bimler and she can bring one family member to appt with Dr. Alvy Bimler. She verbalzied understanding.

## 2020-07-10 NOTE — Telephone Encounter (Signed)
Called and scheduled appt on 10/4 at 1:15 pm, instructed to arrive 15 mins early. She is ware of appt time.

## 2020-07-17 ENCOUNTER — Other Ambulatory Visit: Payer: Self-pay | Admitting: Hematology and Oncology

## 2020-07-17 DIAGNOSIS — C8238 Follicular lymphoma grade IIIa, lymph nodes of multiple sites: Secondary | ICD-10-CM

## 2020-07-18 ENCOUNTER — Telehealth: Payer: Self-pay

## 2020-07-18 ENCOUNTER — Ambulatory Visit
Admission: EM | Admit: 2020-07-18 | Discharge: 2020-07-18 | Disposition: A | Discharge status: Home / Self Care | Payer: Medicare HMO | Attending: Emergency Medicine | Admitting: Emergency Medicine

## 2020-07-18 NOTE — Telephone Encounter (Signed)
Called and given appt details for CT scan at [facility] for 10/6 and Dr. Alvy Bimler appt at 1 pm . Given lab and flush appt for 10 am on 10/5. She verbalized understanding and will pick up contrast on 10/5.

## 2020-07-18 NOTE — ED Triage Notes (Signed)
Pt here for covid testing for possible exposure; no sx

## 2020-07-21 ENCOUNTER — Inpatient Hospital Stay: Payer: Medicare HMO | Admitting: Hematology and Oncology

## 2020-07-21 ENCOUNTER — Other Ambulatory Visit (HOSPITAL_COMMUNITY): Payer: Medicare HMO

## 2020-07-21 LAB — NOVEL CORONAVIRUS, NAA: SARS-CoV-2, NAA: NOT DETECTED

## 2020-07-22 ENCOUNTER — Inpatient Hospital Stay: Payer: Medicare HMO

## 2020-07-22 ENCOUNTER — Telehealth: Payer: Self-pay | Admitting: Hematology and Oncology

## 2020-07-22 ENCOUNTER — Other Ambulatory Visit: Payer: Self-pay

## 2020-07-22 ENCOUNTER — Inpatient Hospital Stay: Payer: Medicare HMO | Attending: Hematology and Oncology

## 2020-07-22 DIAGNOSIS — M25512 Pain in left shoulder: Secondary | ICD-10-CM | POA: Diagnosis not present

## 2020-07-22 DIAGNOSIS — Z5111 Encounter for antineoplastic chemotherapy: Secondary | ICD-10-CM | POA: Insufficient documentation

## 2020-07-22 DIAGNOSIS — Z79899 Other long term (current) drug therapy: Secondary | ICD-10-CM | POA: Insufficient documentation

## 2020-07-22 DIAGNOSIS — Z95828 Presence of other vascular implants and grafts: Secondary | ICD-10-CM

## 2020-07-22 DIAGNOSIS — Z23 Encounter for immunization: Secondary | ICD-10-CM | POA: Insufficient documentation

## 2020-07-22 DIAGNOSIS — Z5112 Encounter for antineoplastic immunotherapy: Secondary | ICD-10-CM | POA: Insufficient documentation

## 2020-07-22 DIAGNOSIS — Z5189 Encounter for other specified aftercare: Secondary | ICD-10-CM | POA: Insufficient documentation

## 2020-07-22 DIAGNOSIS — C8238 Follicular lymphoma grade IIIa, lymph nodes of multiple sites: Secondary | ICD-10-CM

## 2020-07-22 LAB — COMPREHENSIVE METABOLIC PANEL
ALT: 12 U/L (ref 0–44)
AST: 15 U/L (ref 15–41)
Albumin: 3.8 g/dL (ref 3.5–5.0)
Alkaline Phosphatase: 85 U/L (ref 38–126)
Anion gap: 3 — ABNORMAL LOW (ref 5–15)
BUN: 31 mg/dL — ABNORMAL HIGH (ref 8–23)
CO2: 29 mmol/L (ref 22–32)
Calcium: 8.8 mg/dL — ABNORMAL LOW (ref 8.9–10.3)
Chloride: 108 mmol/L (ref 98–111)
Creatinine, Ser: 1.27 mg/dL — ABNORMAL HIGH (ref 0.44–1.00)
GFR calc non Af Amer: 41 mL/min — ABNORMAL LOW (ref >60)
Glucose, Bld: 61 mg/dL — ABNORMAL LOW (ref 70–99)
Potassium: 4.4 mmol/L (ref 3.5–5.1)
Sodium: 140 mmol/L (ref 135–145)
Total Bilirubin: 0.7 mg/dL (ref 0.3–1.2)
Total Protein: 6 g/dL — ABNORMAL LOW (ref 6.5–8.1)

## 2020-07-22 LAB — CBC WITH DIFFERENTIAL/PLATELET
Abs Immature Granulocytes: 0.02 10*3/uL (ref 0.00–0.07)
Basophils Absolute: 0 10*3/uL (ref 0.0–0.1)
Basophils Relative: 1 %
Eosinophils Absolute: 0.1 10*3/uL (ref 0.0–0.5)
Eosinophils Relative: 2 %
HCT: 39.7 % (ref 36.0–46.0)
Hemoglobin: 13.1 g/dL (ref 12.0–15.0)
Immature Granulocytes: 0 %
Lymphocytes Relative: 11 %
Lymphs Abs: 0.7 10*3/uL (ref 0.7–4.0)
MCH: 30.9 pg (ref 26.0–34.0)
MCHC: 33 g/dL (ref 30.0–36.0)
MCV: 93.6 fL (ref 80.0–100.0)
Monocytes Absolute: 0.7 10*3/uL (ref 0.1–1.0)
Monocytes Relative: 11 %
Neutro Abs: 4.7 10*3/uL (ref 1.7–7.7)
Neutrophils Relative %: 75 %
Platelets: 129 10*3/uL — ABNORMAL LOW (ref 150–400)
RBC: 4.24 MIL/uL (ref 3.87–5.11)
RDW: 13.3 % (ref 11.5–15.5)
WBC: 6.3 10*3/uL (ref 4.0–10.5)
nRBC: 0 % (ref 0.0–0.2)

## 2020-07-22 MED ORDER — SODIUM CHLORIDE 0.9% FLUSH
10.0000 mL | INTRAVENOUS | Status: DC | PRN
  Administered 2020-07-22: 10 mL via INTRAVENOUS
  Filled 2020-07-22: qty 10

## 2020-07-22 MED ORDER — HEPARIN SOD (PORK) LOCK FLUSH 100 UNIT/ML IV SOLN
500.0000 [IU] | Freq: Once | INTRAVENOUS | Status: AC
  Administered 2020-07-22: 500 [IU] via INTRAVENOUS
  Filled 2020-07-22: qty 5

## 2020-07-22 NOTE — Telephone Encounter (Signed)
Release: [id number] Faxed medical records to Dpt of [facility] @ fax [phone]

## 2020-07-23 ENCOUNTER — Other Ambulatory Visit: Payer: Self-pay

## 2020-07-23 ENCOUNTER — Encounter (HOSPITAL_BASED_OUTPATIENT_CLINIC_OR_DEPARTMENT_OTHER): Payer: Self-pay

## 2020-07-23 ENCOUNTER — Other Ambulatory Visit: Payer: Self-pay | Admitting: Hematology and Oncology

## 2020-07-23 ENCOUNTER — Inpatient Hospital Stay: Payer: Medicare HMO

## 2020-07-23 ENCOUNTER — Inpatient Hospital Stay (HOSPITAL_BASED_OUTPATIENT_CLINIC_OR_DEPARTMENT_OTHER): Payer: Medicare HMO | Admitting: Hematology and Oncology

## 2020-07-23 ENCOUNTER — Ambulatory Visit (HOSPITAL_BASED_OUTPATIENT_CLINIC_OR_DEPARTMENT_OTHER)
Admission: RE | Admit: 2020-07-23 | Discharge: 2020-07-23 | Disposition: A | Discharge status: Home / Self Care | Payer: Medicare HMO | Source: Ambulatory Visit | Attending: Hematology and Oncology | Admitting: Hematology and Oncology

## 2020-07-23 ENCOUNTER — Ambulatory Visit
Admission: RE | Admit: 2020-07-23 | Discharge: 2020-07-23 | Disposition: A | Discharge status: Home / Self Care | Payer: Self-pay | Source: Ambulatory Visit | Attending: Hematology and Oncology | Admitting: Hematology and Oncology

## 2020-07-23 ENCOUNTER — Encounter: Payer: Self-pay | Admitting: Hematology and Oncology

## 2020-07-23 VITALS — BP 131/64 | HR 72 | Temp 97.7°F | Resp 18 | Ht 61.0 in | Wt 136.0 lb

## 2020-07-23 VITALS — BP 150/92 | HR 68 | Resp 20

## 2020-07-23 DIAGNOSIS — Z23 Encounter for immunization: Secondary | ICD-10-CM | POA: Diagnosis not present

## 2020-07-23 DIAGNOSIS — C8208 Follicular lymphoma grade I, lymph nodes of multiple sites: Secondary | ICD-10-CM

## 2020-07-23 DIAGNOSIS — Z7189 Other specified counseling: Secondary | ICD-10-CM

## 2020-07-23 DIAGNOSIS — C8238 Follicular lymphoma grade IIIa, lymph nodes of multiple sites: Secondary | ICD-10-CM | POA: Diagnosis not present

## 2020-07-23 DIAGNOSIS — G8929 Other chronic pain: Secondary | ICD-10-CM

## 2020-07-23 DIAGNOSIS — Z299 Encounter for prophylactic measures, unspecified: Secondary | ICD-10-CM | POA: Diagnosis not present

## 2020-07-23 DIAGNOSIS — Z5111 Encounter for antineoplastic chemotherapy: Secondary | ICD-10-CM | POA: Diagnosis not present

## 2020-07-23 DIAGNOSIS — M25512 Pain in left shoulder: Secondary | ICD-10-CM

## 2020-07-23 DIAGNOSIS — Z95828 Presence of other vascular implants and grafts: Secondary | ICD-10-CM

## 2020-07-23 MED ORDER — ACYCLOVIR 400 MG PO TABS: 400.0000 mg | ORAL_TABLET | Freq: Every day | ORAL | 3 refills | Status: DC

## 2020-07-23 MED ORDER — INFLUENZA VAC A&B SA ADJ QUAD 0.5 ML IM PRSY
0.5000 mL | PREFILLED_SYRINGE | Freq: Once | INTRAMUSCULAR | Status: AC
  Administered 2020-07-23: 0.5 mL via INTRAMUSCULAR

## 2020-07-23 MED ORDER — LIDOCAINE-PRILOCAINE 2.5-2.5 % EX CREA: TOPICAL_CREAM | CUTANEOUS | 3 refills | Status: DC

## 2020-07-23 MED ORDER — INFLUENZA VAC A&B SA ADJ QUAD 0.5 ML IM PRSY
PREFILLED_SYRINGE | INTRAMUSCULAR | Status: AC
  Filled 2020-07-23: qty 0.5

## 2020-07-23 MED ORDER — HEPARIN SOD (PORK) LOCK FLUSH 100 UNIT/ML IV SOLN
500.0000 [IU] | Freq: Once | INTRAVENOUS | Status: AC
  Administered 2020-07-23: 500 [IU] via INTRAVENOUS
  Filled 2020-07-23: qty 5

## 2020-07-23 MED ORDER — ALLOPURINOL 300 MG PO TABS: 300.0000 mg | ORAL_TABLET | Freq: Every day | ORAL | 3 refills | Status: DC

## 2020-07-23 MED ORDER — ONDANSETRON HCL 8 MG PO TABS: 8.0000 mg | ORAL_TABLET | Freq: Three times a day (TID) | ORAL | 1 refills | Status: DC | PRN

## 2020-07-23 MED ORDER — PROCHLORPERAZINE MALEATE 10 MG PO TABS: 10.0000 mg | ORAL_TABLET | Freq: Four times a day (QID) | ORAL | 1 refills | Status: DC | PRN

## 2020-07-23 MED ORDER — SODIUM CHLORIDE 0.9% FLUSH
10.0000 mL | Freq: Once | INTRAVENOUS | Status: AC
  Administered 2020-07-23: 10 mL via INTRAVENOUS
  Filled 2020-07-23: qty 10

## 2020-07-23 MED ORDER — IOHEXOL 300 MG/ML  SOLN
100.0000 mL | Freq: Once | INTRAMUSCULAR | Status: AC | PRN
  Administered 2020-07-23: 80 mL via INTRAVENOUS

## 2020-07-23 NOTE — Assessment & Plan Note (Addendum)
I have reviewed multiple CT imaging with the patient and family Unfortunately, she has signs of cancer relapse The size of the lymphadenopathy were too small for repeat biopsy She is quite asymptomatic I do not believe were dealing with abnormal transformation to high-grade lymphoma Assuming she had similar disease as before, namely, follicular lymphoma grade 1-2, we can proceed with treatment without repeating biopsy She is in agreement We reviewed the current guidelines We discussed the risk, benefits, side effects of combination chemotherapy of bendamustine with rituximab, single agent rituximab only or lenalidomide with rituximab Previously, she achieved complete remission with combination of bendamustine with rituximab but her last cycle of treatment was complicated by UTI, atrial fibrillation with rapid ventricular response and others side effects which I believe stem from pancytopenia  After much discussion, she wants to proceed with previous treatment with combination of bendamustine with rituximab .We discussed the role of chemotherapy is of curative intent The decision was made based on publication in the Blood: Randomized trial of bendamustine-rituximab or R-CHOP/R-CVP in first-line treatment of indolent NHL or MCL: the BRIGHT study.  [place] [address], Lucianne Lei der [address], [place] BS, [address] M, Kwan YL, Simpson D, Craig M, Kolibaba K, Issa S, [place], [place] DM, Munteanu M, Aram Beecham JM SO  Blood. 2014;123(19):2944.  The chemotherapy consists of   1. Bendamustine at 90 mg/m2 on day 1 & 2 2. Rituximab at 375 mg/m2 on day 1 Each cycle + 28 days  Plan for 6 cycles total with or without Neulasta support  In the international phase III BRIGHT trial, 447 previously untreated patients with advanced stage follicular (n = 662 patients), mantle cell (n = 74 patients), or other indolent lymphoma were randomly assigned to six cycles of BR according to the same  dose and schedule described above or to R-CHOP or R-CVP. BR resulted in similar complete (31 versus 25 percent) and overall (97 versus 91 percent) response rates. BR was associated with higher rates of vomiting and drug hypersensitivity and lower rates of peripheral neuropathy/paresthesia and alopecia. The use of prophylactic antiemetics was not specified in the protocol and was more common among patients assigned to R-CHOP.   We discussed some of the risks, benefits and side-effects of Rituximab with Bendamustine.   Some of the short term side-effects included, though not limited to, risk of fatigue, weight loss, tumor lysis syndrome, risk of allergic reactions, pancytopenia, life-threatening infections, need for transfusions of blood products, nausea, vomiting, change in bowel habits, admission to hospital for various reasons, and risks of death.   Long term side-effects are also discussed including permanent damage to nerve function, chronic fatigue, and rare secondary malignancy including bone marrow disorders.   The patient is aware that the response rates discussed earlier is not guaranteed.    After a long discussion, patient made an informed decision to proceed with the prescribed plan of care.   Patient education material was dispensed I recommend she returns next week to get her blood drawn I plan to reduce bendamustine to 60 mg/m given history of pancytopenia with a 90 mg per metered squared dose I recommend minimum 3 cycles of treatment before repeating imaging study I do not plan prophylactic G-CSF support I recommend acyclovir daily for prophylaxis as well as allopurinol to prevent hyperuricemia

## 2020-07-23 NOTE — Progress Notes (Signed)
ON [facility] - Lymphoma and CLL  No Change  Continue With Treatment as Ordered.  Original Decision Date/Time: 09/04/2018 16:52     A cycle is every 28 days:     Bendamustine      Rituximab   **Always confirm dose/schedule in your pharmacy ordering system**  Patient Characteristics: Follicular Lymphoma, Grades 1, 2, and 3A, Second Line, Prior Treatment with Rituximab Alone, Relapse < 24 Months Disease Type: Follicular Lymphoma Disease Type: Not Applicable Disease Type: Not Applicable [place] Stage: IV Tumor Grade: 2 Line of Therapy: Second Line Prior Treatment: Prior Treatment with Rituximab Alone Time to Relapse: Relapse < 24 Months Intent of Therapy: Curative Intent, Discussed with Patient 

## 2020-07-23 NOTE — Progress Notes (Signed)
[name] OFF[facility] PROGRESS NOTE  Patient Care Team: Domenick Bookbinder, MD as PCP - General (Internal Medicine) [name], MD as PCP - Cardiology (Cardiology) Carola Frost, RN as Registered Nurse (Medical Oncology) [name], MD as Attending Physician (Internal Medicine)  ASSESSMENT & PLAN:  Grade 3a follicular lymphoma of lymph nodes of multiple regions [name]) I have reviewed multiple CT imaging with the patient and family Unfortunately, she has signs of cancer relapse The size of the lymphadenopathy were too small for repeat biopsy She is quite asymptomatic I do not believe were dealing with abnormal transformation to high-grade lymphoma Assuming she had similar disease as before, namely, follicular lymphoma grade 1-2, we can proceed with treatment without repeating biopsy She is in agreement We reviewed the current guidelines We discussed the risk, benefits, side effects of combination chemotherapy of bendamustine with rituximab, single agent rituximab only or lenalidomide with rituximab Previously, she achieved complete remission with combination of bendamustine with rituximab but her last cycle of treatment was complicated by UTI, atrial fibrillation with rapid ventricular response and others side effects which I believe stem from pancytopenia  After much discussion, she wants to proceed with previous treatment with combination of bendamustine with rituximab .We discussed the role of chemotherapy is of curative intent The decision was made based on publication in the Blood: Randomized trial of bendamustine-rituximab or R-CHOP/R-CVP in first-line treatment of indolent NHL or MCL: the BRIGHT study.  [place] [address], [name] [address] [place] BS, [address] M, [name], Simpson D, Craig M, Kolibaba K, [place] S, [place], [name], Munteanu M, Aram Beecham JM SO  Blood. 2014;123(19):2944.  The chemotherapy consists of   1.  Bendamustine at 90 mg/m2 on day 1 & 2 2. Rituximab at 375 mg/m2 on day 1 Each cycle + 28 days  Plan for 6 cycles total with or without Neulasta support  In the international phase III BRIGHT trial, 447 previously untreated patients with advanced stage follicular (n = 956 patients), mantle cell (n = 74 patients), or other indolent lymphoma were randomly assigned to six cycles of BR according to the same dose and schedule described above or to R-CHOP or R-CVP. BR resulted in similar complete (31 versus 25 percent) and overall (97 versus 91 percent) response rates. BR was associated with higher rates of vomiting and drug hypersensitivity and lower rates of peripheral neuropathy/paresthesia and alopecia. The use of prophylactic antiemetics was not specified in the protocol and was more common among patients assigned to R-CHOP.   We discussed some of the risks, benefits and side-effects of Rituximab with Bendamustine.   Some of the short term side-effects included, though not limited to, risk of fatigue, weight loss, tumor lysis syndrome, risk of allergic reactions, pancytopenia, life-threatening infections, need for transfusions of blood products, nausea, vomiting, change in bowel habits, admission to [name] for various reasons, and risks of death.   Long term side-effects are also discussed including permanent damage to nerve function, chronic fatigue, and rare secondary malignancy including bone marrow disorders.   The patient is aware that the response rates discussed earlier is not guaranteed.    After a long discussion, patient made an informed decision to proceed with the prescribed plan of care.   Patient education material was dispensed I recommend she returns next week to get her blood drawn I plan to reduce bendamustine to 60 mg/m given history of pancytopenia with a 90 mg per  metered squared dose I recommend minimum 3 cycles of treatment before repeating imaging study I do not plan  prophylactic G-CSF support I recommend acyclovir daily for prophylaxis as well as allopurinol to prevent hyperuricemia   Goals of care, counseling/discussion We have extensive discussions about goals of care Given her history of recurrent lymphoma, treatment is considered palliative She would likely need chronic maintenance treatment indefinitely   Preventive measure We discussed the importance of preventive care and reviewed the vaccination programs. She does not have any prior allergic reactions to influenza vaccination. She agrees to proceed with influenza vaccination today and we will administer it today at the clinic.   Left shoulder pain Recent MRI showed tendinopathy This is unrelated to her recurrent lymphoma   Orders Placed This Encounter  Procedures  . Hepatitis B surface antigen    Standing Status:   Standing    Number of Occurrences:   1    Standing Expiration Date:   07/23/2021  . Hepatitis B core antibody, total    Standing Status:   Standing    Number of Occurrences:   1    Standing Expiration Date:   07/23/2021  . CBC with Differential ([place] Only)    Standing Status:   Standing    Number of Occurrences:   20    Standing Expiration Date:   07/23/2021  . CMP ([place] only)    Standing Status:   Standing    Number of Occurrences:   20    Standing Expiration Date:   07/23/2021  . Uric acid    Standing Status:   Standing    Number of Occurrences:   9    Standing Expiration Date:   07/23/2021    All questions were answered. The patient knows to call the clinic with any problems, questions or concerns. The total time spent in the appointment was 55 minutes encounter with patients including review of chart and various tests results, discussions about plan of care and coordination of care plan   Carrie Lark, MD 07/23/2020 3:07 PM  INTERVAL HISTORY: Please see below for problem oriented charting. She returns for further follow-up with family She  continues to have chronic shoulder pain She had CT imaging done today  SUMMARY OF ONCOLOGIC HISTORY: Oncology History  Grade 3a follicular lymphoma of lymph nodes of multiple regions ([name])  01/21/2015 Surgery   She underwent excisional lymph node biopsy that came back follicular lymphoma grade 3   01/21/2015 Pathology Results   Accession: [id number] biopsy confirmed follicular lymphoma   03/05/8415 Imaging   Echocardiogram showed ejection fraction of 55-60%   02/11/2015 Imaging    PET CT scan show possible splenic involvement and diffuse lymphadenopathy throughout   02/11/2015 Bone Marrow Biopsy    bone marrow biopsy was performed and is involved by lymphoma with translocation of igH/BCL2   02/13/2015 Procedure   She had port placement.   02/17/2015 - 06/02/2015 Chemotherapy   She received R-CHOP chemo x 6   02/17/2015 Adverse Reaction   She had mild infusion reaction with cycle 1 of treatment.   04/18/2015 Imaging   PET CT scan showed near complete response to treatment.   04/22/2015 Adverse Reaction   Vincristine dose was reduced by 50% due to neuropathy from cycle 4 onwards   06/25/2015 - 07/06/2015 [name] Admission   She was hospitalized for recent sepsis/bacteremia and a fib with RVR   07/11/2015 Imaging   repeat PEt scan showed complete response to Rx  07/14/2015 - 06/16/2017 Chemotherapy   She received maintenance Rituximab every 60 days   12/15/2015 Imaging   PET CT showed no evidence of cancer recurrence   06/15/2016 PET scan   No evidence of active lymphoma on skullbase to thigh FDG PET scan. Small LEFT periaortic retroperitoneal lymph nodes without significant metabolic activity ( Deauville 1). No change from prior   03/24/2017 Imaging   1. Borderline enlarged abdominal retroperitoneal lymph nodes, stable. No new adenopathy in the chest, abdomen or pelvis. 2. Aortic atherosclerosis (ICD10-170.0). 3. Enlarged pulmonary arteries, indicative of pulmonary arterial  hypertension.   09/14/2017 PET scan   Stable exam. No evidence of active lymphoma or other acute findings. (Deauville score 1)   08/08/2018 PET scan   1. Evidence of progressive/recurrent disease, as evidenced by new hypermetabolic nodes and subcutaneous nodule/nodes throughout left chest and abdomen. (Deauville 5). 2. Likely physiologic right nasopharyngeal hypermetabolism. Recommend attention on follow-up. 3. Incidental findings, including pulmonary artery enlargement, suggesting pulmonary arterial hypertension, stable left lower lobe pulmonary nodule, and aortic Atherosclerosis (ICD10-I70.0).   08/29/2018 Initial Biopsy   Soft tissue simple excision left back: Follicular lymphoma grade 1-2 positive for CD20, PAX5, bcl-6, and bcl-2.  Ki-67 30%   09/11/2018 - 02/20/2019 Chemotherapy   The patient had Bendamustine and Rituximab   12/13/2018 Imaging   1. Response to therapy, as evidenced by resolution of left chest wall nodularity and decrease in size of small left axillary nodes. 2. No residual soft tissue thickening at the site of hypermetabolism along the posterior left eleventh rib. Resolution of adjacent subcutaneous nodularity. 3. No new or progressive disease identified. 4.  Aortic Atherosclerosis (ICD10-I70.0). 5. Bilateral pulmonary nodules, similar. 6. Trace air within the urinary bladder could be iatrogenic. Possible pericystic edema. Correlate with symptoms of cystitis and recent instrumentation.   07/30/2019 Imaging   Ct chest, abdomen and pelvis No findings suspicious for active lymphoma in the chest, abdomen, or pelvis.   Small bilateral pulmonary nodules, unchanged, benign.   Again noted is trace nondependent gas within the bladder. Correlate for recent intervention.   07/09/2020 Imaging   MRI imaging performed at the [name] on the left shoulder reviewed significant lymphadenopathy, worrisome for recurrent lymphoma   07/23/2020 Imaging   1. Recurrent lymphoma as evidenced  by enlarging left pectoral and abdominal retroperitoneal lymph nodes. Small left supraclavicular lymph nodes are new. 2. Avascular necrosis in the right femoral head with secondary degenerative osteoarthritis in the right hip. 3.  Aortic atherosclerosis (ICD10-I70.0). 4. Enlarged pulmonic trunk, indicative of pulmonary arterial hypertension.     08/04/2020 -  Chemotherapy   The patient had palonosetron (ALOXI) injection 0.25 mg, 0.25 mg, Intravenous,  Once, 0 of 4 cycles bendamustine (BENDEKA) 100 mg in sodium chloride 0.9 % 50 mL (1.8519 mg/mL) chemo infusion, 60 mg/m2 = 100 mg (100 % of original dose 60 mg/m2), Intravenous,  Once, 0 of 4 cycles Dose modification: 60 mg/m2 (original dose 60 mg/m2, Cycle 1, Reason: Dose not tolerated) riTUXimab-pvvr (RUXIENCE) 600 mg in sodium chloride 0.9 % 250 mL (1.9355 mg/mL) infusion, 375 mg/m2 = 600 mg, Intravenous,  Once, 0 of 4 cycles  for chemotherapy treatment.      REVIEW OF SYSTEMS:   Constitutional: Denies fevers, chills or abnormal weight loss Eyes: Denies blurriness of vision Ears, nose, mouth, throat, and face: Denies mucositis or sore throat Respiratory: Denies cough, dyspnea or wheezes Cardiovascular: Denies palpitation, chest discomfort or lower extremity swelling Gastrointestinal:  Denies nausea, heartburn or change in bowel habits  Skin: Denies abnormal skin rashes Lymphatics: Denies new lymphadenopathy or easy bruising Neurological:Denies numbness, tingling or new weaknesses Behavioral/Psych: Mood is stable, no new changes  All other systems were reviewed with the patient and are negative.  I have reviewed the past medical history, past surgical history, social history and family history with the patient and they are unchanged from previous note.  ALLERGIES:  is allergic to amiodarone.  MEDICATIONS:  Current Outpatient Medications  Medication Sig Dispense Refill  . acetaminophen (TYLENOL) 500 MG tablet Take 500 mg by mouth  every 6 (six) hours as needed for moderate pain or headache.    Marland Kitchen acyclovir (ZOVIRAX) 400 MG tablet Take 1 tablet (400 mg total) by mouth daily. 30 tablet 3  . allopurinol (ZYLOPRIM) 300 MG tablet Take 1 tablet (300 mg total) by mouth daily. 30 tablet 3  . apixaban (ELIQUIS) 5 MG TABS tablet Take 5 mg by mouth 2 (two) times daily.     . bisacodyl (DULCOLAX) 5 MG EC tablet Take 5 mg by mouth daily as needed for moderate constipation.    . diclofenac sodium (VOLTAREN) 1 % GEL Apply 2 g topically 3 (three) times daily as needed (knee pain). 100 g 0  . levothyroxine (SYNTHROID, LEVOTHROID) 112 MCG tablet Take 112 mcg by mouth daily.   9  . lidocaine-prilocaine (EMLA) cream Apply to affected area once 30 g 3  . metoprolol succinate (TOPROL-XL) 25 MG 24 hr tablet Take 0.5 tablets (12.5 mg total) by mouth daily. Take with or immediately following a meal. 90 tablet 3  . ondansetron (ZOFRAN) 8 MG tablet Take 1 tablet (8 mg total) by mouth every 8 (eight) hours as needed for refractory nausea / vomiting. Start on day 2 after bendamustine chemo. 30 tablet 1  . polyvinyl alcohol (LIQUIFILM TEARS) 1.4 % ophthalmic solution Place 1 drop into both eyes as needed for dry eyes.    Marland Kitchen prochlorperazine (COMPAZINE) 10 MG tablet Take 1 tablet (10 mg total) by mouth every 6 (six) hours as needed (Nausea or vomiting). 30 tablet 1   No current facility-administered medications for this visit.   Facility-Administered Medications Ordered in Other Visits  Medication Dose Route Frequency Provider Last Rate Last Admin  . ondansetron (ZOFRAN) 8 mg in sodium chloride 0.9 % 50 mL IVPB   Intravenous Once Michaeleen Down, MD      . sodium chloride 0.9 % injection 10 mL  10 mL Intravenous PRN Alvy Bimler, Sinclaire Artiga, MD   10 mL at 04/05/17 1315    PHYSICAL EXAMINATION: ECOG PERFORMANCE STATUS: 1 - Symptomatic but completely ambulatory  Vitals:   07/23/20 1310  BP: 131/64  Pulse: 72  Resp: 18  Temp: 97.7 F (36.5 C)  SpO2: 100%   Filed  Weights   07/23/20 1310  Weight: 136 lb (61.7 kg)    GENERAL:alert, no distress and comfortable NEURO: alert & oriented x 3 with fluent speech, no focal motor/sensory deficits  LABORATORY DATA:  I have reviewed the data as listed    Component Value Date/Time   NA 140 07/22/2020 1047   NA 143 07/16/2019 1132   NA 142 08/29/2017 0838   K 4.4 07/22/2020 1047   K 4.2 08/29/2017 0838   CL 108 07/22/2020 1047   CO2 29 07/22/2020 1047   CO2 23 08/29/2017 0838   GLUCOSE 61 (L) 07/22/2020 1047   GLUCOSE 97 08/29/2017 0838   BUN 31 (H) 07/22/2020 1047   BUN 27 07/16/2019 1132   BUN 24.0 08/29/2017  1791   CREATININE 1.27 (H) 07/22/2020 1047   CREATININE 1.51 (H) 07/30/2019 1010   CREATININE 1.0 08/29/2017 0838   CALCIUM 8.8 (L) 07/22/2020 1047   CALCIUM 8.9 08/29/2017 0838   PROT 6.0 (L) 07/22/2020 1047   PROT 6.4 08/29/2017 0838   ALBUMIN 3.8 07/22/2020 1047   ALBUMIN 3.8 08/29/2017 0838   AST 15 07/22/2020 1047   AST 13 (L) 07/30/2019 1010   AST 18 08/29/2017 0838   ALT 12 07/22/2020 1047   ALT 9 07/30/2019 1010   ALT 16 08/29/2017 0838   ALKPHOS 85 07/22/2020 1047   ALKPHOS 93 08/29/2017 0838   BILITOT 0.7 07/22/2020 1047   BILITOT 0.5 07/30/2019 1010   BILITOT 0.33 08/29/2017 0838   GFRNONAA 41 (L) 07/22/2020 1047   GFRNONAA 33 (L) 07/30/2019 1010   GFRAA 58 (L) 05/26/2020 1103   GFRAA 39 (L) 07/30/2019 1010    No results found for: SPEP, UPEP  Lab Results  Component Value Date   WBC 6.3 07/22/2020   NEUTROABS 4.7 07/22/2020   HGB 13.1 07/22/2020   HCT 39.7 07/22/2020   MCV 93.6 07/22/2020   PLT 129 (L) 07/22/2020      Chemistry      Component Value Date/Time   NA 140 07/22/2020 1047   NA 143 07/16/2019 1132   NA 142 08/29/2017 0838   K 4.4 07/22/2020 1047   K 4.2 08/29/2017 0838   CL 108 07/22/2020 1047   CO2 29 07/22/2020 1047   CO2 23 08/29/2017 0838   BUN 31 (H) 07/22/2020 1047   BUN 27 07/16/2019 1132   BUN 24.0 08/29/2017 0838   CREATININE  1.27 (H) 07/22/2020 1047   CREATININE 1.51 (H) 07/30/2019 1010   CREATININE 1.0 08/29/2017 0838      Component Value Date/Time   CALCIUM 8.8 (L) 07/22/2020 1047   CALCIUM 8.9 08/29/2017 0838   ALKPHOS 85 07/22/2020 1047   ALKPHOS 93 08/29/2017 0838   AST 15 07/22/2020 1047   AST 13 (L) 07/30/2019 1010   AST 18 08/29/2017 0838   ALT 12 07/22/2020 1047   ALT 9 07/30/2019 1010   ALT 16 08/29/2017 0838   BILITOT 0.7 07/22/2020 1047   BILITOT 0.5 07/30/2019 1010   BILITOT 0.33 08/29/2017 0838       RADIOGRAPHIC STUDIES: I have reviewed multiple imaging studies with the patient and family I have personally reviewed the radiological images as listed and agreed with the findings in the report. CT CHEST ABDOMEN PELVIS W CONTRAST  Result Date: 07/23/2020 CLINICAL DATA:  Lymphoma. EXAM: CT CHEST, ABDOMEN, AND PELVIS WITH CONTRAST TECHNIQUE: Multidetector CT imaging of the chest, abdomen and pelvis was performed following the standard protocol during bolus administration of intravenous contrast. CONTRAST:  86m OMNIPAQUE IOHEXOL 300 MG/ML  SOLN COMPARISON:  07/30/2019. FINDINGS: CT CHEST FINDINGS Cardiovascular: Right IJ Port-A-Cath terminates in the low SVC. Atherosclerotic calcification of the aorta and aortic valve. Pulmonic trunk and heart are enlarged. No pericardial effusion. Mediastinum/Nodes: No pathologically enlarged mediastinal, hilar or right axillary lymph nodes. Left subpectoral and axillary lymph nodes measure up to 1.3 cm (2/10), increased from 7 mm on 07/30/2019. Left supraclavicular lymph nodes measure up to 7 mm, new. Esophagus is unremarkable. Lungs/Pleura: Image quality is degraded by respiratory motion and expiratory phase imaging, which creates increased density in the lungs. 2 mm left upper lobe nodule (4/32), unchanged and considered benign. 4 mm left lower lobe nodule (4/79) has a central calcification and is unchanged from  03/24/2017, indicative of a benign lesion. No pleural  fluid. Airway is otherwise unremarkable. Musculoskeletal: No worrisome lytic or sclerotic lesions. CT ABDOMEN PELVIS FINDINGS Hepatobiliary: Liver and gallbladder are unremarkable. No biliary ductal dilatation. Pancreas: Negative. Spleen: Subcentimeter low-attenuation lesion in the spleen is too small to characterize. Adrenals/Urinary Tract: Adrenal glands are unremarkable. Scarring and cortical thinning in the kidneys bilaterally. Kidneys are otherwise unremarkable. Ureters are decompressed. Bladder is low in volume. Stomach/Bowel: Stomach, small bowel and colon are unremarkable. Appendix is not visualized. Vascular/Lymphatic: Atherosclerotic calcification of the aorta without aneurysm. Abdominal retroperitoneal lymph nodes measure up to 10 mm in the low left periaortic and left common iliac stations (2/70 and 76), increased from 6 mm and 5 mm, respectively. Reproductive: Hysterectomy.  No adnexal mass. Other: No free fluid.  Mesenteries and peritoneum are unremarkable. Musculoskeletal: Degenerative and postoperative changes in the spine. Avascular necrosis in the right femoral head. Degenerative changes in the right hip. IMPRESSION: 1. Recurrent lymphoma as evidenced by enlarging left pectoral and abdominal retroperitoneal lymph nodes. Small left supraclavicular lymph nodes are new. 2. Avascular necrosis in the right femoral head with secondary degenerative osteoarthritis in the right hip. 3.  Aortic atherosclerosis (ICD10-I70.0). 4. Enlarged pulmonic trunk, indicative of pulmonary arterial hypertension. Electronically Signed   By: Lorin Picket M.D.   On: 07/23/2020 10:08

## 2020-07-23 NOTE — Assessment & Plan Note (Signed)
We discussed the importance of preventive care and reviewed the vaccination programs. She does not have any prior allergic reactions to influenza vaccination. She agrees to proceed with influenza vaccination today and we will administer it today at the clinic.  

## 2020-07-23 NOTE — Assessment & Plan Note (Signed)
Recent MRI showed tendinopathy This is unrelated to her recurrent lymphoma

## 2020-07-23 NOTE — Patient Instructions (Signed)

## 2020-07-23 NOTE — Assessment & Plan Note (Signed)
We have extensive discussions about goals of care Given her history of recurrent lymphoma, treatment is considered palliative She would likely need chronic maintenance treatment indefinitely

## 2020-07-24 ENCOUNTER — Encounter (HOSPITAL_COMMUNITY): Payer: Self-pay

## 2020-07-24 ENCOUNTER — Emergency Department (HOSPITAL_COMMUNITY): Payer: No Typology Code available for payment source

## 2020-07-24 ENCOUNTER — Other Ambulatory Visit: Payer: Self-pay

## 2020-07-24 ENCOUNTER — Emergency Department (HOSPITAL_COMMUNITY)
Admission: EM | Admit: 2020-07-24 | Discharge: 2020-07-24 | Disposition: A | Discharge status: Home / Self Care | Payer: No Typology Code available for payment source | Attending: Emergency Medicine | Admitting: Emergency Medicine

## 2020-07-24 DIAGNOSIS — I5021 Acute systolic (congestive) heart failure: Secondary | ICD-10-CM | POA: Diagnosis not present

## 2020-07-24 DIAGNOSIS — Z96651 Presence of right artificial knee joint: Secondary | ICD-10-CM | POA: Insufficient documentation

## 2020-07-24 DIAGNOSIS — Z7901 Long term (current) use of anticoagulants: Secondary | ICD-10-CM | POA: Diagnosis not present

## 2020-07-24 DIAGNOSIS — I4891 Unspecified atrial fibrillation: Secondary | ICD-10-CM | POA: Diagnosis not present

## 2020-07-24 DIAGNOSIS — N183 Chronic kidney disease, stage 3 unspecified: Secondary | ICD-10-CM | POA: Diagnosis not present

## 2020-07-24 DIAGNOSIS — I130 Hypertensive heart and chronic kidney disease with heart failure and stage 1 through stage 4 chronic kidney disease, or unspecified chronic kidney disease: Secondary | ICD-10-CM | POA: Diagnosis not present

## 2020-07-24 DIAGNOSIS — R079 Chest pain, unspecified: Secondary | ICD-10-CM | POA: Diagnosis not present

## 2020-07-24 DIAGNOSIS — E0965 Drug or chemical induced diabetes mellitus with hyperglycemia: Secondary | ICD-10-CM | POA: Diagnosis not present

## 2020-07-24 DIAGNOSIS — E039 Hypothyroidism, unspecified: Secondary | ICD-10-CM | POA: Insufficient documentation

## 2020-07-24 DIAGNOSIS — Z79899 Other long term (current) drug therapy: Secondary | ICD-10-CM | POA: Insufficient documentation

## 2020-07-24 LAB — CBC
HCT: 38.2 % (ref 36.0–46.0)
Hemoglobin: 12.3 g/dL (ref 12.0–15.0)
MCH: 30.7 pg (ref 26.0–34.0)
MCHC: 32.2 g/dL (ref 30.0–36.0)
MCV: 95.3 fL (ref 80.0–100.0)
Platelets: 114 10*3/uL — ABNORMAL LOW (ref 150–400)
RBC: 4.01 MIL/uL (ref 3.87–5.11)
RDW: 13.4 % (ref 11.5–15.5)
WBC: 4.8 10*3/uL (ref 4.0–10.5)
nRBC: 0 % (ref 0.0–0.2)

## 2020-07-24 LAB — BASIC METABOLIC PANEL
Anion gap: 10 (ref 5–15)
BUN: 27 mg/dL — ABNORMAL HIGH (ref 8–23)
CO2: 21 mmol/L — ABNORMAL LOW (ref 22–32)
Calcium: 8.6 mg/dL — ABNORMAL LOW (ref 8.9–10.3)
Chloride: 102 mmol/L (ref 98–111)
Creatinine, Ser: 1.4 mg/dL — ABNORMAL HIGH (ref 0.44–1.00)
GFR calc non Af Amer: 36 mL/min — ABNORMAL LOW (ref >60)
Glucose, Bld: 181 mg/dL — ABNORMAL HIGH (ref 70–99)
Potassium: 4.2 mmol/L (ref 3.5–5.1)
Sodium: 133 mmol/L — ABNORMAL LOW (ref 135–145)

## 2020-07-24 LAB — TROPONIN I (HIGH SENSITIVITY)
Troponin I (High Sensitivity): 20 ng/L — ABNORMAL HIGH (ref <18)
Troponin I (High Sensitivity): 16 ng/L (ref <18)

## 2020-07-24 NOTE — ED Triage Notes (Signed)
Pt from home with ems for mid chest pain that radiates to her right jaw starting at about 5pm, lasting an hour. No other symptoms. Pt given 324 ASA and 2 nitros, pain went from 6/10 to 2/10. VSS, pt diagnosed with non hodgkin's lymphoma yesterday

## 2020-07-24 NOTE — ED Provider Notes (Signed)
[facility] Provider Note   CSN: [id number] Arrival date & time: 07/24/20  1859     History Chief Complaint  Patient presents with  . Chest Pain    Carrie [name] is a 79 y.o. female history of A. fib on Eliquis, non-Hodgkin's lymphoma who presented with chest pain. Patient was just told by oncology yesterday that she had a recurrence of her non-Hodgkin's lymphoma.  She states that she was doing well until this afternoon has suddenly onset of chest pain. States that it was sharp and lasted several hours.  Patient took some aspirin and was given nitro and now is pain-free.   The history is provided by the patient.       Past Medical History:  Diagnosis Date  . A-fib ([place])   . Anemia    "years ago"  . Atrial fibrillation with rapid ventricular response ([place]) 06/25/2015  . Carpal tunnel syndrome, bilateral   . Cervical spondylosis without myelopathy 10/25/2013  . Dental bridge present   . DJD (degenerative joint disease)   . Dysrhythmia    WENT INTO A FIB IN 2017   . Elevated troponin 02/18/2015  . Follicular lymphoma grade 3a ([place] [place]) 02/03/2015  . History of echocardiogram    Echo 12/16: EF 60-65%, no RWMA, severe LAE  . History of nuclear stress test    Myoview 1/17: EF 55%, Normal study. No ischemia or scar.  . Hypothyroid   . Nausea without vomiting 06/20/2015  . Pneumonia   . Stage III chronic kidney disease ([place]) 07/01/2015  . Thrush of mouth and esophagus ([place]) 06/25/2015    Patient Active Problem List   Diagnosis Date Noted  . Goals of care, counseling/discussion 07/23/2020  . Preventive measure 07/23/2020  . Chronic leukopenia 09/25/2019  . Syncope 03/05/2019  . Postural dizziness with presyncope 03/04/2019  . Atypical chest pain   . Near syncope   . Acute systolic congestive heart failure ([place])   . Atrial fibrillation with RVR ([place]) 02/25/2019  . GERD with esophagitis 09/19/2018  . Skin lesions 06/06/2017  . Dyspnea on  exertion 03/25/2017  . Anxiety disorder 03/25/2017  . Generalized weakness 03/19/2017  . Insomnia disorder 03/19/2017  . Dysuria 08/31/2016  . Acute back pain with sciatica, right 08/31/2016  . Chronic back pain 07/02/2016  . Quality of life palliative care encounter 03/02/2016  . Port catheter in place 03/01/2016  . Chronic atrial fibrillation 09/08/2015  . Essential hypertension 07/02/2015  . Diabetes mellitus, likely due to steroids 06/27/2015  . Chronic kidney disease, stage III (moderate) ([place]) 06/25/2015  . PAF (paroxysmal atrial fibrillation) ([place]) 06/25/2015  . Atrial fibrillation with rapid ventricular response ([place]) 06/25/2015  . Left shoulder pain 03/31/2015  . Other fatigue 03/07/2015  . Grade 3a follicular lymphoma of lymph nodes of multiple regions ([place]) 02/03/2015  . Hypothyroidism 09/09/2009    Past Surgical History:  Procedure Laterality Date  . ABDOMINAL HYSTERECTOMY    . APPENDECTOMY    . BACK SURGERY     X5-lumbar-fusion  . COLONOSCOPY    . DILATION AND CURETTAGE OF UTERUS    . LYMPH NODE BIOPSY Right 01/21/2015   Procedure: RIGHT GROIN LYMPH NODE BIOPSY;  Surgeon: Erroll Luna, MD;  Location: [facility];  Service: General;  Laterality: Right;  . MASS EXCISION Left 08/29/2018   Procedure: EXCISION LEFT BACK  MASS;  Surgeon: Erroll Luna, MD;  Location: [facility];  Service: General;  Laterality: Left;  . Ovarian cyst  resection    . patelar tendon transplants     Left/right  . PORTACATH PLACEMENT Right 02/13/2015   Procedure: INSERTION PORT-A-CATH WITH ULTRASOUND;  Surgeon: Erroll Luna, MD;  Location: [facility];  Service: General;  Laterality: Right;  . PORTACATH PLACEMENT N/A 08/29/2018   Procedure: INSERTION PORT-A-CATH WITH ULTRA SOUND ERAS PATHWAY;  Surgeon: Erroll Luna, MD;  Location: [facility];  Service: General;  Laterality: N/A;  . TOTAL KNEE ARTHROPLASTY  2011   Right  . TUBAL LIGATION       OB History   No obstetric history on  file.     Family History  Problem Relation Age of Onset  . Cancer Mother        Breast, lung NHL  . Cancer Sister        Multiple myeloma    Social History   Tobacco Use  . Smoking status: Never Smoker  . Smokeless tobacco: Never Used  Vaping Use  . Vaping Use: Never used  Substance Use Topics  . Alcohol use: Yes    Comment: occassional wine  . Drug use: No    Home Medications Prior to Admission medications   Medication Sig Start Date End Date Taking? Authorizing Provider  acetaminophen (TYLENOL) 500 MG tablet Take 500 mg by mouth every 6 (six) hours as needed for moderate pain or headache.    [provider]  acyclovir (ZOVIRAX) 400 MG tablet Take 1 tablet (400 mg total) by mouth daily. 07/23/20   [name], MD  allopurinol (ZYLOPRIM) 300 MG tablet Take 1 tablet (300 mg total) by mouth daily. 07/23/20   [name], MD  apixaban (ELIQUIS) 5 MG TABS tablet Take 5 mg by mouth 2 (two) times daily.     [provider]  bisacodyl (DULCOLAX) 5 MG EC tablet Take 5 mg by mouth daily as needed for moderate constipation.    [provider]  diclofenac sodium (VOLTAREN) 1 % GEL Apply 2 g topically 3 (three) times daily as needed (knee pain). 08/01/18   Quintella Reichert, MD  levothyroxine (SYNTHROID, LEVOTHROID) 112 MCG tablet Take 112 mcg by mouth daily.  04/22/15   [provider]  lidocaine-prilocaine (EMLA) cream Apply to affected area once 07/23/20   [name], MD  metoprolol succinate (TOPROL-XL) 25 MG 24 hr tablet Take 0.5 tablets (12.5 mg total) by mouth daily. Take with or immediately following a meal. 06/20/19   Lyda Jester M, PA-C  ondansetron (ZOFRAN) 8 MG tablet Take 1 tablet (8 mg total) by mouth every 8 (eight) hours as needed for refractory nausea / vomiting. Start on day 2 after bendamustine chemo. 07/23/20   [name], MD  polyvinyl alcohol (LIQUIFILM TEARS) 1.4 % ophthalmic solution Place 1 drop into both eyes as needed for dry  eyes.    [provider]  prochlorperazine (COMPAZINE) 10 MG tablet Take 1 tablet (10 mg total) by mouth every 6 (six) hours as needed (Nausea or vomiting). 07/23/20   [name], MD    Allergies    Amiodarone  Review of Systems   Review of Systems  Cardiovascular: Positive for chest pain.  All other systems reviewed and are negative.   Physical Exam Updated Vital Signs BP 115/68   Pulse 60   Temp 98.5 F (36.9 C)   Resp 19   SpO2 96%   Physical Exam Vitals and nursing note reviewed.  Constitutional:      Comments: Chronically ill   HENT:     Head:  Normocephalic.  Eyes:     Extraocular Movements: Extraocular movements intact.     Pupils: Pupils are equal, round, and reactive to light.  Cardiovascular:     Rate and Rhythm: Normal rate and regular rhythm.     Heart sounds: Normal heart sounds.  Pulmonary:     Effort: Pulmonary effort is normal.     Breath sounds: Normal breath sounds.  Abdominal:     General: Bowel sounds are normal.     Palpations: Abdomen is soft.  Musculoskeletal:        General: Normal range of motion.     Cervical back: Normal range of motion and neck supple.  Skin:    General: Skin is warm.     Capillary Refill: Capillary refill takes less than 2 seconds.  Neurological:     General: No focal deficit present.     Mental Status: She is alert and oriented to person, place, and time.  Psychiatric:        Mood and Affect: Mood normal.        Behavior: Behavior normal.     ED Results / Procedures / Treatments   Labs (all labs ordered are listed, but only abnormal results are displayed) Labs Reviewed  BASIC METABOLIC PANEL - Abnormal; Notable for the following components:      Result Value   Sodium 133 (*)    CO2 21 (*)    Glucose, Bld 181 (*)    BUN 27 (*)    Creatinine, Ser 1.40 (*)    Calcium 8.6 (*)    GFR calc non Af Amer 36 (*)    All other components within normal limits  CBC - Abnormal; Notable for the following  components:   Platelets 114 (*)    All other components within normal limits  TROPONIN I (HIGH SENSITIVITY) - Abnormal; Notable for the following components:   Troponin I (High Sensitivity) 20 (*)    All other components within normal limits  TROPONIN I (HIGH SENSITIVITY)    EKG None  Radiology DG Chest 2 View  Result Date: 07/24/2020 CLINICAL DATA:  Chest pain EXAM: CHEST - 2 VIEW COMPARISON:  July 23, 2020 CT chest FINDINGS: The heart size and mediastinal contours are within normal limits. A right-sided MediPort catheter seen with the tip in the mid SVC. Both lungs are clear. The visualized skeletal structures are unremarkable. IMPRESSION: No active cardiopulmonary disease. Electronically Signed   By: Prudencio Pair M.D.   On: 07/24/2020 19:34   CT CHEST ABDOMEN PELVIS W CONTRAST  Result Date: 07/23/2020 CLINICAL DATA:  Lymphoma. EXAM: CT CHEST, ABDOMEN, AND PELVIS WITH CONTRAST TECHNIQUE: Multidetector CT imaging of the chest, abdomen and pelvis was performed following the standard protocol during bolus administration of intravenous contrast. CONTRAST:  67m OMNIPAQUE IOHEXOL 300 MG/ML  SOLN COMPARISON:  07/30/2019. FINDINGS: CT CHEST FINDINGS Cardiovascular: Right IJ Port-A-Cath terminates in the low SVC. Atherosclerotic calcification of the aorta and aortic valve. Pulmonic trunk and heart are enlarged. No pericardial effusion. Mediastinum/Nodes: No pathologically enlarged mediastinal, hilar or right axillary lymph nodes. Left subpectoral and axillary lymph nodes measure up to 1.3 cm (2/10), increased from 7 mm on 07/30/2019. Left supraclavicular lymph nodes measure up to 7 mm, new. Esophagus is unremarkable. Lungs/Pleura: Image quality is degraded by respiratory motion and expiratory phase imaging, which creates increased density in the lungs. 2 mm left upper lobe nodule (4/32), unchanged and considered benign. 4 mm left lower lobe nodule (4/79) has a central calcification  and is unchanged  from 03/24/2017, indicative of a benign lesion. No pleural fluid. Airway is otherwise unremarkable. Musculoskeletal: No worrisome lytic or sclerotic lesions. CT ABDOMEN PELVIS FINDINGS Hepatobiliary: Liver and gallbladder are unremarkable. No biliary ductal dilatation. Pancreas: Negative. Spleen: Subcentimeter low-attenuation lesion in the spleen is too small to characterize. Adrenals/Urinary Tract: Adrenal glands are unremarkable. Scarring and cortical thinning in the kidneys bilaterally. Kidneys are otherwise unremarkable. Ureters are decompressed. Bladder is low in volume. Stomach/Bowel: Stomach, small bowel and colon are unremarkable. Appendix is not visualized. Vascular/Lymphatic: Atherosclerotic calcification of the aorta without aneurysm. Abdominal retroperitoneal lymph nodes measure up to 10 mm in the low left periaortic and left common iliac stations (2/70 and 76), increased from 6 mm and 5 mm, respectively. Reproductive: Hysterectomy.  No adnexal mass. Other: No free fluid.  Mesenteries and peritoneum are unremarkable. Musculoskeletal: Degenerative and postoperative changes in the spine. Avascular necrosis in the right femoral head. Degenerative changes in the right hip. IMPRESSION: 1. Recurrent lymphoma as evidenced by enlarging left pectoral and abdominal retroperitoneal lymph nodes. Small left supraclavicular lymph nodes are new. 2. Avascular necrosis in the right femoral head with secondary degenerative osteoarthritis in the right hip. 3.  Aortic atherosclerosis (ICD10-I70.0). 4. Enlarged pulmonic trunk, indicative of pulmonary arterial hypertension. Electronically Signed   By: Lorin Picket M.D.   On: 07/23/2020 10:08    Procedures Procedures (including critical care time)  Medications Ordered in ED Medications - No data to display  ED Course  I have reviewed the triage vital signs and the nursing notes.  Pertinent labs & imaging results that were available during my care of the patient  were reviewed by me and considered in my medical decision making (see chart for details).    MDM Rules/Calculators/A&P                         Addisen MATASHA SMIGELSKI is a 79 y.o. female here with chest pain.  Patient was just diagnosed with recurrent non-Hodgkin's lymphoma yesterday.  I chest pain today.  I wonder if it is related to diagnosis of non-Hodgkin's lymphoma.  She has no known CAD .  She also has no history of PE. She is also not tachycardic.  Plan to get troponin x2, cbc, bmp, chest x-ray.   10:32 PM Initial troponin was 20.  Repeat troponin is 16.  Patient is pain-free.  Stable for discharge and will have her follow-up outpatient with her oncologist and cardiologist.  Final Clinical Impression(s) / ED Diagnoses Final diagnoses:  None    Rx / DC Orders ED Discharge Orders    None       Drenda Freeze, MD 07/24/20 2233

## 2020-07-24 NOTE — Discharge Instructions (Addendum)
Your labs today initially showed slightly elevated heart enzyme tests that trended normal.   Your pain can be from your non-Hodgkin's lymphoma.  Please follow-up with your cardiologist as well as oncologist.  Return to ER if you have worse chest pain, trouble breathing.

## 2020-07-24 NOTE — ED Notes (Signed)
Patient verbalizes understanding of discharge instructions. Opportunity for questioning and answers were provided. Armband removed by staff, pt discharged from ED ambulatory.   

## 2020-07-29 NOTE — Progress Notes (Signed)
Pharmacist Chemotherapy Monitoring - Initial Assessment    Anticipated start date: 08/04/2020   Regimen:  . Are orders appropriate based on the patient's diagnosis, regimen, and cycle? Yes . Does the plan date match the patient's scheduled date? Yes . Is the sequencing of drugs appropriate? Yes . Are the premedications appropriate for the patient's regimen? Yes . Prior Authorization for treatment is: Not Started o If applicable, is the correct biosimilar selected based on the patient's insurance? yes  Organ Function and Labs: Marland Kitchen Are dose adjustments needed based on the patient's renal function, hepatic function, or hematologic function? Yes . Are appropriate labs ordered prior to the start of patient's treatment? Yes . Other organ system assessment, if indicated: N/A . The following baseline labs, if indicated, have been ordered: rituximab: baseline Hepatitis B labs  Dose Assessment: . Are the drug doses appropriate? Yes . Are the following correct: o Drug concentrations Yes o IV fluid compatible with drug Yes o Administration routes Yes o Timing of therapy Yes . If applicable, does the patient have documented access for treatment and/or plans for port-a-cath placement? yes . If applicable, have lifetime cumulative doses been properly documented and assessed? no Lifetime Dose Tracking  . Doxorubicin: 270.828 mg/m2 (440 mg) = 60.18 % of the maximum lifetime dose of 450 mg/m2  o   Toxicity Monitoring/Prevention: . The patient has the following take home antiemetics prescribed: Ondansetron and Prochlorperazine . The patient has the following take home medications prescribed: N/A . Medication allergies and previous infusion related reactions, if applicable, have been reviewed and addressed. No . The patient's current medication list has been assessed for drug-drug interactions with their chemotherapy regimen. no significant drug-drug interactions were identified on review.  Order  Review: . Are the treatment plan orders signed? Yes . Is the patient scheduled to see a provider prior to their treatment? No  I verify that I have reviewed each item in the above checklist and answered each question accordingly.  George Haggart D 07/29/2020 4:16 PM

## 2020-07-30 ENCOUNTER — Other Ambulatory Visit: Payer: Self-pay

## 2020-07-30 ENCOUNTER — Inpatient Hospital Stay: Payer: Medicare HMO

## 2020-07-30 ENCOUNTER — Other Ambulatory Visit: Payer: Self-pay | Admitting: Hematology and Oncology

## 2020-07-30 DIAGNOSIS — Z7189 Other specified counseling: Secondary | ICD-10-CM

## 2020-07-30 DIAGNOSIS — C8238 Follicular lymphoma grade IIIa, lymph nodes of multiple sites: Secondary | ICD-10-CM

## 2020-07-30 DIAGNOSIS — Z5111 Encounter for antineoplastic chemotherapy: Secondary | ICD-10-CM | POA: Diagnosis not present

## 2020-07-30 LAB — CBC WITH DIFFERENTIAL (CANCER CENTER ONLY)
Abs Immature Granulocytes: 0.01 10*3/uL (ref 0.00–0.07)
Basophils Absolute: 0 10*3/uL (ref 0.0–0.1)
Basophils Relative: 1 %
Eosinophils Absolute: 0.1 10*3/uL (ref 0.0–0.5)
Eosinophils Relative: 3 %
HCT: 41.5 % (ref 36.0–46.0)
Hemoglobin: 13.4 g/dL (ref 12.0–15.0)
Immature Granulocytes: 0 %
Lymphocytes Relative: 11 %
Lymphs Abs: 0.6 10*3/uL — ABNORMAL LOW (ref 0.7–4.0)
MCH: 30.5 pg (ref 26.0–34.0)
MCHC: 32.3 g/dL (ref 30.0–36.0)
MCV: 94.5 fL (ref 80.0–100.0)
Monocytes Absolute: 0.3 10*3/uL (ref 0.1–1.0)
Monocytes Relative: 6 %
Neutro Abs: 4.3 10*3/uL (ref 1.7–7.7)
Neutrophils Relative %: 79 %
Platelet Count: 133 10*3/uL — ABNORMAL LOW (ref 150–400)
RBC: 4.39 MIL/uL (ref 3.87–5.11)
RDW: 13.2 % (ref 11.5–15.5)
WBC Count: 5.4 10*3/uL (ref 4.0–10.5)
nRBC: 0 % (ref 0.0–0.2)

## 2020-07-30 LAB — CMP (CANCER CENTER ONLY)
ALT: 14 U/L (ref 0–44)
AST: 16 U/L (ref 15–41)
Albumin: 3.7 g/dL (ref 3.5–5.0)
Alkaline Phosphatase: 81 U/L (ref 38–126)
Anion gap: 5 (ref 5–15)
BUN: 26 mg/dL — ABNORMAL HIGH (ref 8–23)
CO2: 31 mmol/L (ref 22–32)
Calcium: 9.4 mg/dL (ref 8.9–10.3)
Chloride: 106 mmol/L (ref 98–111)
Creatinine: 1.59 mg/dL — ABNORMAL HIGH (ref 0.44–1.00)
GFR, Estimated: 31 mL/min — ABNORMAL LOW (ref >60)
Glucose, Bld: 99 mg/dL (ref 70–99)
Potassium: 4.6 mmol/L (ref 3.5–5.1)
Sodium: 142 mmol/L (ref 135–145)
Total Bilirubin: 0.7 mg/dL (ref 0.3–1.2)
Total Protein: 6.1 g/dL — ABNORMAL LOW (ref 6.5–8.1)

## 2020-07-30 LAB — HEPATITIS B CORE ANTIBODY, TOTAL: Hep B Core Total Ab: NONREACTIVE

## 2020-07-30 LAB — HEPATITIS B SURFACE ANTIGEN: Hepatitis B Surface Ag: NONREACTIVE

## 2020-07-30 LAB — URIC ACID: Uric Acid, Serum: 4.8 mg/dL (ref 2.5–7.1)

## 2020-08-04 ENCOUNTER — Other Ambulatory Visit: Payer: Self-pay

## 2020-08-04 ENCOUNTER — Inpatient Hospital Stay: Payer: Medicare HMO

## 2020-08-04 VITALS — BP 108/64 | HR 71 | Temp 98.3°F | Resp 20 | Ht 61.0 in | Wt 138.0 lb

## 2020-08-04 DIAGNOSIS — Z5111 Encounter for antineoplastic chemotherapy: Secondary | ICD-10-CM | POA: Diagnosis not present

## 2020-08-04 DIAGNOSIS — C8238 Follicular lymphoma grade IIIa, lymph nodes of multiple sites: Secondary | ICD-10-CM

## 2020-08-04 DIAGNOSIS — Z7189 Other specified counseling: Secondary | ICD-10-CM

## 2020-08-04 MED ORDER — SODIUM CHLORIDE 0.9 % IV SOLN
375.0000 mg/m2 | Freq: Once | INTRAVENOUS | Status: AC
  Administered 2020-08-04: 600 mg via INTRAVENOUS
  Filled 2020-08-04: qty 50

## 2020-08-04 MED ORDER — ACETAMINOPHEN 325 MG PO TABS
650.0000 mg | ORAL_TABLET | Freq: Once | ORAL | Status: AC
  Administered 2020-08-04: 650 mg via ORAL

## 2020-08-04 MED ORDER — DIPHENHYDRAMINE HCL 25 MG PO CAPS
25.0000 mg | ORAL_CAPSULE | Freq: Once | ORAL | Status: AC
  Administered 2020-08-04: 25 mg via ORAL

## 2020-08-04 MED ORDER — SODIUM CHLORIDE 0.9 % IV SOLN
60.0000 mg/m2 | Freq: Once | INTRAVENOUS | Status: AC
  Administered 2020-08-04: 100 mg via INTRAVENOUS
  Filled 2020-08-04: qty 4

## 2020-08-04 MED ORDER — HEPARIN SOD (PORK) LOCK FLUSH 100 UNIT/ML IV SOLN
500.0000 [IU] | Freq: Once | INTRAVENOUS | Status: AC | PRN
  Administered 2020-08-04: 500 [IU]
  Filled 2020-08-04: qty 5

## 2020-08-04 MED ORDER — ACETAMINOPHEN 325 MG PO TABS
ORAL_TABLET | ORAL | Status: AC
  Filled 2020-08-04: qty 2

## 2020-08-04 MED ORDER — PALONOSETRON HCL INJECTION 0.25 MG/5ML
0.2500 mg | Freq: Once | INTRAVENOUS | Status: AC
  Administered 2020-08-04: 0.25 mg via INTRAVENOUS

## 2020-08-04 MED ORDER — DIPHENHYDRAMINE HCL 25 MG PO CAPS
ORAL_CAPSULE | ORAL | Status: AC
  Filled 2020-08-04: qty 1

## 2020-08-04 MED ORDER — SODIUM CHLORIDE 0.9% FLUSH
10.0000 mL | INTRAVENOUS | Status: DC | PRN
  Administered 2020-08-04: 10 mL
  Filled 2020-08-04: qty 10

## 2020-08-04 MED ORDER — SODIUM CHLORIDE 0.9 % IV SOLN
Freq: Once | INTRAVENOUS | Status: AC
  Filled 2020-08-04: qty 250

## 2020-08-04 MED ORDER — PALONOSETRON HCL INJECTION 0.25 MG/5ML
INTRAVENOUS | Status: AC
  Filled 2020-08-04: qty 5

## 2020-08-04 MED ORDER — SODIUM CHLORIDE 0.9 % IV SOLN
10.0000 mg | Freq: Once | INTRAVENOUS | Status: AC
  Administered 2020-08-04: 10 mg via INTRAVENOUS
  Filled 2020-08-04: qty 10

## 2020-08-04 NOTE — Patient Instructions (Addendum)
[facility] Discharge Instructions for Patients Receiving Chemotherapy  Today you received the following chemotherapy agents:  Rituximab and Bendamustine Verl Dicker)  To help prevent nausea and vomiting after your treatment, we encourage you to take your nausea medication as directed by your MD.   If you develop nausea and vomiting that is not controlled by your nausea medication, call the clinic.   BELOW ARE SYMPTOMS THAT SHOULD BE REPORTED IMMEDIATELY:  *FEVER GREATER THAN 100.5 F  *CHILLS WITH OR WITHOUT FEVER  NAUSEA AND VOMITING THAT IS NOT CONTROLLED WITH YOUR NAUSEA MEDICATION  *UNUSUAL SHORTNESS OF BREATH  *UNUSUAL BRUISING OR BLEEDING  TENDERNESS IN MOUTH AND THROAT WITH OR WITHOUT PRESENCE OF ULCERS  *URINARY PROBLEMS  *BOWEL PROBLEMS  UNUSUAL RASH Items with * indicate a potential emergency and should be followed up as soon as possible.  Feel free to call the clinic should you have any questions or concerns. The clinic phone number is [phone].  Please show the [place] at check-in to the Emergency Department and triage nurse.  Rituximab injection What is this medicine? RITUXIMAB (ri TUX i mab) is a monoclonal antibody. It is used to treat certain types of cancer like non-Hodgkin lymphoma and chronic lymphocytic leukemia. It is also used to treat rheumatoid arthritis, granulomatosis with polyangiitis (or Wegener's granulomatosis), microscopic polyangiitis, and pemphigus vulgaris. This medicine may be used for other purposes; ask your health care provider or pharmacist if you have questions. COMMON BRAND NAME(S): Rituxan, RUXIENCE What should I tell my health care provider before I take this medicine? They need to know if you have any of these conditions:  heart disease  infection (especially a virus infection such as hepatitis B, chickenpox, cold sores, or herpes)  immune system problems  irregular heartbeat  kidney disease  low  blood counts, like low white cell, platelet, or red cell counts  lung or breathing disease, like asthma  recently received or scheduled to receive a vaccine  an unusual or allergic reaction to rituximab, other medicines, foods, dyes, or preservatives  pregnant or trying to get pregnant  breast-feeding How should I use this medicine? This medicine is for infusion into a vein. It is administered in a hospital or clinic by a specially trained health care professional. A special MedGuide will be given to you by the pharmacist with each prescription and refill. Be sure to read this information carefully each time. Talk to your pediatrician regarding the use of this medicine in children. This medicine is not approved for use in children. Overdosage: If you think you have taken too much of this medicine contact a poison control center or emergency room at once. NOTE: This medicine is only for you. Do not share this medicine with others. What if I miss a dose? It is important not to miss a dose. Call your doctor or health care professional if you are unable to keep an appointment. What may interact with this medicine?  cisplatin  live virus vaccines This list may not describe all possible interactions. Give your health care provider a list of all the medicines, herbs, non-prescription drugs, or dietary supplements you use. Also tell them if you smoke, drink alcohol, or use illegal drugs. Some items may interact with your medicine. What should I watch for while using this medicine? Your condition will be monitored carefully while you are receiving this medicine. You may need blood work done while you are taking this medicine. This medicine can cause serious allergic  reactions. To reduce your risk you may need to take medicine before treatment with this medicine. Take your medicine as directed. In some patients, this medicine may cause a serious brain infection that may cause death. If you have any  problems seeing, thinking, speaking, walking, or standing, tell your healthcare professional right away. If you cannot reach your healthcare professional, urgently seek other source of medical care. Call your doctor or health care professional for advice if you get a fever, chills or sore throat, or other symptoms of a cold or flu. Do not treat yourself. This drug decreases your body's ability to fight infections. Try to avoid being around people who are sick. Do not become pregnant while taking this medicine or for at least 12 months after stopping it. Women should inform their doctor if they wish to become pregnant or think they might be pregnant. There is a potential for serious side effects to an unborn child. Talk to your health care professional or pharmacist for more information. Do not breast-feed an infant while taking this medicine or for at least 6 months after stopping it. What side effects may I notice from receiving this medicine? Side effects that you should report to your doctor or health care professional as soon as possible:  allergic reactions like skin rash, itching or hives; swelling of the face, lips, or tongue  breathing problems  chest pain  changes in vision  diarrhea  headache with fever, neck stiffness, sensitivity to light, nausea, or confusion  fast, irregular heartbeat  loss of memory  low blood counts - this medicine may decrease the number of white blood cells, red blood cells and platelets. You may be at increased risk for infections and bleeding.  mouth sores  problems with balance, talking, or walking  redness, blistering, peeling or loosening of the skin, including inside the mouth  signs of infection - fever or chills, cough, sore throat, pain or difficulty passing urine  signs and symptoms of kidney injury like trouble passing urine or change in the amount of urine  signs and symptoms of liver injury like dark yellow or brown urine; general ill  feeling or flu-like symptoms; light-colored stools; loss of appetite; nausea; right upper belly pain; unusually weak or tired; yellowing of the eyes or skin  signs and symptoms of low blood pressure like dizziness; feeling faint or lightheaded, falls; unusually weak or tired  stomach pain  swelling of the ankles, feet, hands  unusual bleeding or bruising  vomiting Side effects that usually do not require medical attention (report to your doctor or health care professional if they continue or are bothersome):  headache  joint pain  muscle cramps or muscle pain  nausea  tiredness This list may not describe all possible side effects. Call your doctor for medical advice about side effects. You may report side effects to FDA at 1-800-FDA-1088. Where should I keep my medicine? This drug is given in a hospital or clinic and will not be stored at home. NOTE: This sheet is a summary. It may not cover all possible information. If you have questions about this medicine, talk to your doctor, pharmacist, or health care provider.  2020 Elsevier/Gold Standard (2018-11-15 22:01:36)  Bendamustine Injection What is this medicine? BENDAMUSTINE (BEN da MUS teen) is a chemotherapy drug. It is used to treat chronic lymphocytic leukemia and non-Hodgkin lymphoma. This medicine may be used for other purposes; ask your health care provider or pharmacist if you have questions. COMMON BRAND NAME(S):  Kristine Royal, Treanda What should I tell my health care provider before I take this medicine? They need to know if you have any of these conditions:  infection (especially a virus infection such as chickenpox, cold sores, or herpes)  kidney disease  liver disease  an unusual or allergic reaction to bendamustine, mannitol, other medicines, foods, dyes, or preservatives  pregnant or trying to get pregnant  breast-feeding How should I use this medicine? This medicine is for infusion into a vein. It  is given by a health care professional in a hospital or clinic setting. Talk to your pediatrician regarding the use of this medicine in children. Special care may be needed. Overdosage: If you think you have taken too much of this medicine contact a poison control center or emergency room at once. NOTE: This medicine is only for you. Do not share this medicine with others. What if I miss a dose? It is important not to miss your dose. Call your doctor or health care professional if you are unable to keep an appointment. What may interact with this medicine? Do not take this medicine with any of the following medications:  clozapine This medicine may also interact with the following medications:  atazanavir  cimetidine  ciprofloxacin  enoxacin  fluvoxamine  medicines for seizures like carbamazepine and phenobarbital  mexiletine  rifampin  tacrine  thiabendazole  zileuton This list may not describe all possible interactions. Give your health care provider a list of all the medicines, herbs, non-prescription drugs, or dietary supplements you use. Also tell them if you smoke, drink alcohol, or use illegal drugs. Some items may interact with your medicine. What should I watch for while using this medicine? This drug may make you feel generally unwell. This is not uncommon, as chemotherapy can affect healthy cells as well as cancer cells. Report any side effects. Continue your course of treatment even though you feel ill unless your doctor tells you to stop. You may need blood work done while you are taking this medicine. Call your doctor or healthcare provider for advice if you get a fever, chills or sore throat, or other symptoms of a cold or flu. Do not treat yourself. This drug decreases your body's ability to fight infections. Try to avoid being around people who are sick. This medicine may cause serious skin reactions. They can happen weeks to months after starting the medicine.  Contact your healthcare provider right away if you notice fevers or flu-like symptoms with a rash. The rash may be red or purple and then turn into blisters or peeling of the skin. Or, you might notice a red rash with swelling of the face, lips or lymph nodes in your neck or under your arms. This medicine may increase your risk to bruise or bleed. Call your doctor or healthcare provider if you notice any unusual bleeding. Talk to your doctor about your risk of cancer. You may be more at risk for certain types of cancers if you take this medicine. Do not become pregnant while taking this medicine or for at least 6 months after stopping it. Women should inform their doctor if they wish to become pregnant or think they might be pregnant. Men should not father a child while taking this medicine and for at least 3 months after stopping it. There is a potential for serious side effects to an unborn child. Talk to your healthcare provider or pharmacist for more information. Do not breast-feed an infant while taking this medicine  or for at least 1 week after stopping it. This medicine may make it more difficult to father a child. You should talk with your doctor or healthcare provider if you are concerned about your fertility. What side effects may I notice from receiving this medicine? Side effects that you should report to your doctor or health care professional as soon as possible:  allergic reactions like skin rash, itching or hives, swelling of the face, lips, or tongue  low blood counts - this medicine may decrease the number of white blood cells, red blood cells and platelets. You may be at increased risk for infections and bleeding.  rash, fever, and swollen lymph nodes  redness, blistering, peeling, or loosening of the skin, including inside the mouth  signs of infection like fever or chills, cough, sore throat, pain or difficulty passing urine  signs of decreased platelets or bleeding like  bruising, pinpoint red spots on the skin, black, tarry stools, blood in the urine  signs of decreased red blood cells like being unusually weak or tired, fainting spells, lightheadedness  signs and symptoms of kidney injury like trouble passing urine or change in the amount of urine  signs and symptoms of liver injury like dark yellow or brown urine; general ill feeling or flu-like symptoms; light-colored stools; loss of appetite; nausea; right upper belly pain; unusually weak or tired; yellowing of the eyes or skin Side effects that usually do not require medical attention (report to your doctor or health care professional if they continue or are bothersome):  constipation  decreased appetite  diarrhea  headache  mouth sores  nausea, vomiting  tiredness This list may not describe all possible side effects. Call your doctor for medical advice about side effects. You may report side effects to FDA at 1-800-FDA-1088. Where should I keep my medicine? This drug is given in a hospital or clinic and will not be stored at home. NOTE: This sheet is a summary. It may not cover all possible information. If you have questions about this medicine, talk to your doctor, pharmacist, or health care provider.  2020 Elsevier/Gold Standard (2018-12-26 10:26:46)

## 2020-08-05 ENCOUNTER — Other Ambulatory Visit: Payer: Self-pay

## 2020-08-05 ENCOUNTER — Inpatient Hospital Stay: Payer: Medicare HMO

## 2020-08-05 VITALS — BP 119/88 | HR 65 | Temp 97.4°F | Resp 20

## 2020-08-05 DIAGNOSIS — Z5111 Encounter for antineoplastic chemotherapy: Secondary | ICD-10-CM | POA: Diagnosis not present

## 2020-08-05 DIAGNOSIS — Z7189 Other specified counseling: Secondary | ICD-10-CM

## 2020-08-05 DIAGNOSIS — C8238 Follicular lymphoma grade IIIa, lymph nodes of multiple sites: Secondary | ICD-10-CM

## 2020-08-05 MED ORDER — SODIUM CHLORIDE 0.9% FLUSH
10.0000 mL | INTRAVENOUS | Status: DC | PRN
  Administered 2020-08-05: 10 mL
  Filled 2020-08-05: qty 10

## 2020-08-05 MED ORDER — SODIUM CHLORIDE 0.9 % IV SOLN
60.0000 mg/m2 | Freq: Once | INTRAVENOUS | Status: AC
  Administered 2020-08-05: 100 mg via INTRAVENOUS
  Filled 2020-08-05: qty 4

## 2020-08-05 MED ORDER — SODIUM CHLORIDE 0.9 % IV SOLN
Freq: Once | INTRAVENOUS | Status: AC
  Filled 2020-08-05: qty 250

## 2020-08-05 MED ORDER — HEPARIN SOD (PORK) LOCK FLUSH 100 UNIT/ML IV SOLN
500.0000 [IU] | Freq: Once | INTRAVENOUS | Status: AC | PRN
  Administered 2020-08-05: 500 [IU]
  Filled 2020-08-05: qty 5

## 2020-08-05 MED ORDER — SODIUM CHLORIDE 0.9 % IV SOLN
10.0000 mg | Freq: Once | INTRAVENOUS | Status: AC
  Administered 2020-08-05: 10 mg via INTRAVENOUS
  Filled 2020-08-05: qty 10

## 2020-08-05 NOTE — Patient Instructions (Signed)
[facility] Discharge Instructions for Patients Receiving Chemotherapy  Today you received the following chemotherapy agents: Bendamustine Verl Dicker)  To help prevent nausea and vomiting after your treatment, we encourage you to take your nausea medication as directed by your MD.   If you develop nausea and vomiting that is not controlled by your nausea medication, call the clinic.   BELOW ARE SYMPTOMS THAT SHOULD BE REPORTED IMMEDIATELY:  *FEVER GREATER THAN 100.5 F  *CHILLS WITH OR WITHOUT FEVER  NAUSEA AND VOMITING THAT IS NOT CONTROLLED WITH YOUR NAUSEA MEDICATION  *UNUSUAL SHORTNESS OF BREATH  *UNUSUAL BRUISING OR BLEEDING  TENDERNESS IN MOUTH AND THROAT WITH OR WITHOUT PRESENCE OF ULCERS  *URINARY PROBLEMS  *BOWEL PROBLEMS  UNUSUAL RASH Items with * indicate a potential emergency and should be followed up as soon as possible.  Feel free to call the clinic should you have any questions or concerns. The clinic phone number is [phone].  Please show the [facility] at check-in to the Emergency Department and triage nurse.

## 2020-08-08 ENCOUNTER — Telehealth: Payer: Self-pay | Admitting: Hematology and Oncology

## 2020-08-08 NOTE — Telephone Encounter (Signed)
Release: [phone] Faxed medical records to [facility] Dept @ fax [phone]

## 2020-08-12 ENCOUNTER — Telehealth: Payer: Self-pay

## 2020-08-12 NOTE — Telephone Encounter (Signed)
Called back. Instructed to drink Gatorade or Pedialyte. She is drinking Gatorade along with other fluids. She will get some more gatorade and push fluids today. Ask her to call in the am with update. She verbalized understanding and will call.

## 2020-08-12 NOTE — Telephone Encounter (Signed)
She called and left a message to call her about blood pressure being low.  Called back. She is feeling weak and tired. Blood pressure at 11 am today 94/62 and HR 72. She is eating and drinking with no problems. She is drinking lots of fluids. Denies nausea/vomiting. Denies diarrhea. She rechecked blood pressure while on the phone, bp 84/60. Offered appt for IV fluids. She declined appt. She is going to rest this afternoon. She is pushing oral intake and will call back if appt needed or changes.

## 2020-08-13 ENCOUNTER — Telehealth: Payer: Self-pay

## 2020-08-13 NOTE — Telephone Encounter (Signed)
She called to give update. She feels better today and she feels the best since treatment. Blood pressure today 121/92 and pulse 68. She started feeling better last night. Instructed to call the office back if needed. She verbalized understanding and will call back for changes. She said thank you Dr. Alvy Bimler for your concern. She really appreciates the concern.

## 2020-08-15 ENCOUNTER — Telehealth: Payer: Self-pay | Admitting: Oncology

## 2020-08-15 NOTE — Telephone Encounter (Signed)
Thanks for helping out I can see her Monday at 145 pm. Order labs before appt She needs to also contact her cardiologist about low BP.

## 2020-08-15 NOTE — Telephone Encounter (Signed)
Carrie [name] left a message that she is feeling very fatigued.  Called her back and she said she has felt tired with no energy since 2 days after chemo on 08/04/20.  She said it is not getting worse but it is also not getting better. She denies having any nausea and is eating and drinking ok.  She said her blood pressure at noon was 104/72.  Advised her that I will let Dr. Alvy Bimler know how she is feeling and call back with recommendations if needed.

## 2020-08-15 NOTE — Telephone Encounter (Signed)
Called Carrie [name] and advised her to call her cardiologist and notify them of her low BP.  Also scheduled appointments for lab/flush at 1:00 and follow up with Dr. Alvy Bimler at 1:45.  She verbalized understanding of appointments and will call her cardiologist.

## 2020-08-18 ENCOUNTER — Inpatient Hospital Stay: Payer: No Typology Code available for payment source | Attending: Hematology and Oncology

## 2020-08-18 ENCOUNTER — Inpatient Hospital Stay: Payer: No Typology Code available for payment source

## 2020-08-18 ENCOUNTER — Inpatient Hospital Stay (HOSPITAL_BASED_OUTPATIENT_CLINIC_OR_DEPARTMENT_OTHER): Payer: No Typology Code available for payment source | Admitting: Hematology and Oncology

## 2020-08-18 ENCOUNTER — Other Ambulatory Visit: Payer: Self-pay

## 2020-08-18 DIAGNOSIS — Z452 Encounter for adjustment and management of vascular access device: Secondary | ICD-10-CM | POA: Diagnosis not present

## 2020-08-18 DIAGNOSIS — Z5112 Encounter for antineoplastic immunotherapy: Secondary | ICD-10-CM | POA: Insufficient documentation

## 2020-08-18 DIAGNOSIS — C8238 Follicular lymphoma grade IIIa, lymph nodes of multiple sites: Secondary | ICD-10-CM

## 2020-08-18 DIAGNOSIS — N183 Chronic kidney disease, stage 3 unspecified: Secondary | ICD-10-CM

## 2020-08-18 DIAGNOSIS — D696 Thrombocytopenia, unspecified: Secondary | ICD-10-CM | POA: Insufficient documentation

## 2020-08-18 DIAGNOSIS — N1330 Unspecified hydronephrosis: Secondary | ICD-10-CM | POA: Diagnosis not present

## 2020-08-18 DIAGNOSIS — Z5111 Encounter for antineoplastic chemotherapy: Secondary | ICD-10-CM | POA: Insufficient documentation

## 2020-08-18 DIAGNOSIS — Z79899 Other long term (current) drug therapy: Secondary | ICD-10-CM | POA: Diagnosis not present

## 2020-08-18 DIAGNOSIS — Z7901 Long term (current) use of anticoagulants: Secondary | ICD-10-CM | POA: Insufficient documentation

## 2020-08-18 DIAGNOSIS — I482 Chronic atrial fibrillation, unspecified: Secondary | ICD-10-CM | POA: Diagnosis not present

## 2020-08-18 DIAGNOSIS — R531 Weakness: Secondary | ICD-10-CM | POA: Diagnosis not present

## 2020-08-18 DIAGNOSIS — R21 Rash and other nonspecific skin eruption: Secondary | ICD-10-CM | POA: Insufficient documentation

## 2020-08-18 DIAGNOSIS — Z7189 Other specified counseling: Secondary | ICD-10-CM

## 2020-08-18 DIAGNOSIS — Z791 Long term (current) use of non-steroidal anti-inflammatories (NSAID): Secondary | ICD-10-CM | POA: Diagnosis not present

## 2020-08-18 DIAGNOSIS — Z9221 Personal history of antineoplastic chemotherapy: Secondary | ICD-10-CM | POA: Insufficient documentation

## 2020-08-18 DIAGNOSIS — Z95828 Presence of other vascular implants and grafts: Secondary | ICD-10-CM

## 2020-08-18 LAB — CMP (CANCER CENTER ONLY)
ALT: 16 U/L (ref 0–44)
AST: 19 U/L (ref 15–41)
Albumin: 3.8 g/dL (ref 3.5–5.0)
Alkaline Phosphatase: 73 U/L (ref 38–126)
Anion gap: 6 (ref 5–15)
BUN: 23 mg/dL (ref 8–23)
CO2: 27 mmol/L (ref 22–32)
Calcium: 9.1 mg/dL (ref 8.9–10.3)
Chloride: 107 mmol/L (ref 98–111)
Creatinine: 1.23 mg/dL — ABNORMAL HIGH (ref 0.44–1.00)
GFR, Estimated: 45 mL/min — ABNORMAL LOW
Glucose, Bld: 96 mg/dL (ref 70–99)
Potassium: 4.3 mmol/L (ref 3.5–5.1)
Sodium: 140 mmol/L (ref 135–145)
Total Bilirubin: 0.6 mg/dL (ref 0.3–1.2)
Total Protein: 6 g/dL — ABNORMAL LOW (ref 6.5–8.1)

## 2020-08-18 LAB — CBC WITH DIFFERENTIAL (CANCER CENTER ONLY)
Abs Immature Granulocytes: 0.01 K/uL (ref 0.00–0.07)
Basophils Absolute: 0 K/uL (ref 0.0–0.1)
Basophils Relative: 1 %
Eosinophils Absolute: 0.2 K/uL (ref 0.0–0.5)
Eosinophils Relative: 4 %
HCT: 37.3 % (ref 36.0–46.0)
Hemoglobin: 12.3 g/dL (ref 12.0–15.0)
Immature Granulocytes: 0 %
Lymphocytes Relative: 8 %
Lymphs Abs: 0.3 K/uL — ABNORMAL LOW (ref 0.7–4.0)
MCH: 31.2 pg (ref 26.0–34.0)
MCHC: 33 g/dL (ref 30.0–36.0)
MCV: 94.7 fL (ref 80.0–100.0)
Monocytes Absolute: 0.5 K/uL (ref 0.1–1.0)
Monocytes Relative: 13 %
Neutro Abs: 3 K/uL (ref 1.7–7.7)
Neutrophils Relative %: 74 %
Platelet Count: 157 K/uL (ref 150–400)
RBC: 3.94 MIL/uL (ref 3.87–5.11)
RDW: 14.1 % (ref 11.5–15.5)
WBC Count: 4.1 K/uL (ref 4.0–10.5)
nRBC: 0 % (ref 0.0–0.2)

## 2020-08-18 LAB — URIC ACID: Uric Acid, Serum: 4 mg/dL (ref 2.5–7.1)

## 2020-08-18 MED ORDER — SODIUM CHLORIDE 0.9% FLUSH
10.0000 mL | Freq: Once | INTRAVENOUS | Status: AC
  Administered 2020-08-18: 10 mL
  Filled 2020-08-18: qty 10

## 2020-08-18 MED ORDER — HEPARIN SOD (PORK) LOCK FLUSH 100 UNIT/ML IV SOLN
500.0000 [IU] | Freq: Once | INTRAVENOUS | Status: AC
  Administered 2020-08-18: 500 [IU]
  Filled 2020-08-18: qty 5

## 2020-08-18 NOTE — Patient Instructions (Signed)

## 2020-08-19 ENCOUNTER — Encounter: Payer: Self-pay | Admitting: Hematology and Oncology

## 2020-08-19 ENCOUNTER — Telehealth: Payer: Self-pay | Admitting: Cardiology

## 2020-08-19 NOTE — Assessment & Plan Note (Signed)
She has chronic kidney disease stage III.  This is stable Continue medical management 

## 2020-08-19 NOTE — Telephone Encounter (Signed)
Patient wanted to tell Dr. Meda Coffee and nurse that cancer has come back. Please contact patient for more details.

## 2020-08-19 NOTE — Assessment & Plan Note (Signed)
Her examination is benign Overall, she tolerated treatment well She is reassured

## 2020-08-19 NOTE — Telephone Encounter (Signed)
Pt is calling to let Dr. Meda Coffee know that back in early October, she went in to have an MRI done of her shoulder, for a recent tear, and they noted her cancer has returned.  Pt states she has cancer underneath her armpit and into parts of her chest.  Pt states Dr. Alvy Bimler started aggressive chemo on her starting Oct 19.  Pt wanted to update Dr. Meda Coffee about this.  Deepest condolences provided to the pt.  Informed the pt that I will send this message to Dr. Meda Coffee to make her aware of this.  Informed the pt if she has anything to add, I will follow-up with her shortly thereafter.  Pt verbalized understanding and agrees with this plan. Pt was gracious for all the assistance provided.

## 2020-08-19 NOTE — Assessment & Plan Note (Signed)
She is rate controlled Her blood pressure is improved today compared to her home blood pressure monitoring She has no symptoms or signs of congestive heart failure She is reassured

## 2020-08-19 NOTE — Progress Notes (Signed)
[place] OFFICE PROGRESS NOTE  Patient Care Team: Domenick Bookbinder, MD as PCP - General (Internal Medicine) [name], MD as PCP - Cardiology (Cardiology) Carola Frost, RN as Registered Nurse (Medical Oncology) [name], MD as Attending Physician (Internal Medicine)  ASSESSMENT & PLAN:  Grade 3a follicular lymphoma of lymph nodes of multiple regions [facility]) Her examination is benign Overall, she tolerated treatment well She is reassured  Chronic atrial fibrillation She is rate controlled Her blood pressure is improved today compared to her home blood pressure monitoring She has no symptoms or signs of congestive heart failure She is reassured  Chronic kidney disease, stage III (moderate) ([place]) She has chronic kidney disease stage III.  This is stable Continue medical management   No orders of the defined types were placed in this encounter.   All questions were answered. The patient knows to call the clinic with any problems, questions or concerns. The total time spent in the appointment was 20 minutes encounter with patients including review of chart and various tests results, discussions about plan of care and coordination of care plan   [name], MD 08/19/2020 11:09 AM  INTERVAL HISTORY: Please see below for problem oriented charting. She is seen urgently because she has been feeling unwell She complains of overall generalized weakness Her blood pressure monitoring at home was low after chemotherapy She was instructed to start increasing oral fluid intake Today, she felt much better Her blood pressure has improved She denies chest pain or shortness of breath No new lymphadenopathy  SUMMARY OF ONCOLOGIC HISTORY: Oncology History  Grade 3a follicular lymphoma of lymph nodes of multiple regions ([place])  01/21/2015 Surgery   She underwent excisional lymph node biopsy that came back follicular lymphoma grade 3   01/21/2015 Pathology Results    Accession: [id number] biopsy confirmed follicular lymphoma   04/05/5092 Imaging   Echocardiogram showed ejection fraction of 55-60%   02/11/2015 Imaging    PET CT scan show possible splenic involvement and diffuse lymphadenopathy throughout   02/11/2015 Bone Marrow Biopsy    bone marrow biopsy was performed and is involved by lymphoma with translocation of igH/BCL2   02/13/2015 Procedure   She had port placement.   02/17/2015 - 06/02/2015 Chemotherapy   She received R-CHOP chemo x 6   02/17/2015 Adverse Reaction   She had mild infusion reaction with cycle 1 of treatment.   04/18/2015 Imaging   PET CT scan showed near complete response to treatment.   04/22/2015 Adverse Reaction   Vincristine dose was reduced by 50% due to neuropathy from cycle 4 onwards   06/25/2015 - 07/06/2015 Hospital Admission   She was hospitalized for recent sepsis/bacteremia and a fib with RVR   07/11/2015 Imaging   repeat PEt scan showed complete response to Rx   07/14/2015 - 06/16/2017 Chemotherapy   She received maintenance Rituximab every 60 days   12/15/2015 Imaging   PET CT showed no evidence of cancer recurrence   06/15/2016 PET scan   No evidence of active lymphoma on skullbase to thigh FDG PET scan. Small LEFT periaortic retroperitoneal lymph nodes without significant metabolic activity ( Deauville 1). No change from prior   03/24/2017 Imaging   1. Borderline enlarged abdominal retroperitoneal lymph nodes, stable. No new adenopathy in the chest, abdomen or pelvis. 2. Aortic atherosclerosis (ICD10-170.0). 3. Enlarged pulmonary arteries, indicative of pulmonary arterial hypertension.   09/14/2017 PET scan   Stable exam. No evidence of active lymphoma or other acute findings. (  Deauville score 1)   08/08/2018 PET scan   1. Evidence of progressive/recurrent disease, as evidenced by new hypermetabolic nodes and subcutaneous nodule/nodes throughout left chest and abdomen. (Deauville 5). 2. Likely physiologic  right nasopharyngeal hypermetabolism. Recommend attention on follow-up. 3. Incidental findings, including pulmonary artery enlargement, suggesting pulmonary arterial hypertension, stable left lower lobe pulmonary nodule, and aortic Atherosclerosis (ICD10-I70.0).   08/29/2018 Initial Biopsy   Soft tissue simple excision left back: Follicular lymphoma grade 1-2 positive for CD20, PAX5, bcl-6, and bcl-2.  Ki-67 30%   09/11/2018 - 02/20/2019 Chemotherapy   The patient had Bendamustine and Rituximab   12/13/2018 Imaging   1. Response to therapy, as evidenced by resolution of left chest wall nodularity and decrease in size of small left axillary nodes. 2. No residual soft tissue thickening at the site of hypermetabolism along the posterior left eleventh rib. Resolution of adjacent subcutaneous nodularity. 3. No new or progressive disease identified. 4.  Aortic Atherosclerosis (ICD10-I70.0). 5. Bilateral pulmonary nodules, similar. 6. Trace air within the urinary bladder could be iatrogenic. Possible pericystic edema. Correlate with symptoms of cystitis and recent instrumentation.   07/30/2019 Imaging   Ct chest, abdomen and pelvis No findings suspicious for active lymphoma in the chest, abdomen, or pelvis.   Small bilateral pulmonary nodules, unchanged, benign.   Again noted is trace nondependent gas within the bladder. Correlate for recent intervention.   07/09/2020 Imaging   MRI imaging performed at [facility] on the left shoulder reviewed significant lymphadenopathy, worrisome for recurrent lymphoma   07/23/2020 Imaging   1. Recurrent lymphoma as evidenced by enlarging left pectoral and abdominal retroperitoneal lymph nodes. Small left supraclavicular lymph nodes are new. 2. Avascular necrosis in the right femoral head with secondary degenerative osteoarthritis in the right hip. 3.  Aortic atherosclerosis (ICD10-I70.0). 4. Enlarged pulmonic trunk, indicative of pulmonary arterial  hypertension.     08/04/2020 -  Chemotherapy   The patient had Bendamustine and Rituxan     REVIEW OF SYSTEMS:   Constitutional: Denies fevers, chills or abnormal weight loss Eyes: Denies blurriness of vision Ears, nose, mouth, throat, and face: Denies mucositis or sore throat Respiratory: Denies cough, dyspnea or wheezes Cardiovascular: Denies palpitation, chest discomfort or lower extremity swelling Gastrointestinal:  Denies nausea, heartburn or change in bowel habits Skin: Denies abnormal skin rashes Lymphatics: Denies new lymphadenopathy or easy bruising Neurological:Denies numbness, tingling or new weaknesses Behavioral/Psych: Mood is stable, no new changes  All other systems were reviewed with the patient and are negative.  I have reviewed the past medical history, past surgical history, social history and family history with the patient and they are unchanged from previous note.  ALLERGIES:  is allergic to amiodarone.  MEDICATIONS:  Current Outpatient Medications  Medication Sig Dispense Refill  . acetaminophen (TYLENOL) 500 MG tablet Take 500 mg by mouth every 6 (six) hours as needed for moderate pain or headache.    Marland Kitchen acyclovir (ZOVIRAX) 400 MG tablet Take 1 tablet (400 mg total) by mouth daily. 30 tablet 3  . allopurinol (ZYLOPRIM) 300 MG tablet Take 1 tablet (300 mg total) by mouth daily. 30 tablet 3  . apixaban (ELIQUIS) 5 MG TABS tablet Take 5 mg by mouth 2 (two) times daily.     . bisacodyl (DULCOLAX) 5 MG EC tablet Take 5 mg by mouth daily as needed for moderate constipation.    . diclofenac sodium (VOLTAREN) 1 % GEL Apply 2 g topically 3 (three) times daily as needed (knee pain). 100 g  0  . levothyroxine (SYNTHROID, LEVOTHROID) 112 MCG tablet Take 112 mcg by mouth daily.   9  . lidocaine-prilocaine (EMLA) cream Apply to affected area once 30 g 3  . metoprolol succinate (TOPROL-XL) 25 MG 24 hr tablet Take 0.5 tablets (12.5 mg total) by mouth daily. Take with or  immediately following a meal. 90 tablet 3  . ondansetron (ZOFRAN) 8 MG tablet Take 1 tablet (8 mg total) by mouth every 8 (eight) hours as needed for refractory nausea / vomiting. Start on day 2 after bendamustine chemo. 30 tablet 1  . polyvinyl alcohol (LIQUIFILM TEARS) 1.4 % ophthalmic solution Place 1 drop into both eyes as needed for dry eyes.    Marland Kitchen prochlorperazine (COMPAZINE) 10 MG tablet Take 1 tablet (10 mg total) by mouth every 6 (six) hours as needed (Nausea or vomiting). 30 tablet 1   No current facility-administered medications for this visit.   Facility-Administered Medications Ordered in Other Visits  Medication Dose Route Frequency Provider Last Rate Last Admin  . ondansetron (ZOFRAN) 8 mg in sodium chloride 0.9 % 50 mL IVPB   Intravenous Once Quintana Canelo, MD      . sodium chloride 0.9 % injection 10 mL  10 mL Intravenous PRN Alvy Bimler, Doretha Goding, MD   10 mL at 04/05/17 1315    PHYSICAL EXAMINATION: ECOG PERFORMANCE STATUS: 1 - Symptomatic but completely ambulatory  Vitals:   08/18/20 1326  BP: 138/77  Pulse: 66  Resp: 19  Temp: (!) 97.5 F (36.4 C)  SpO2: 99%   Filed Weights   08/18/20 1326  Weight: 134 lb 12.8 oz (61.1 kg)    GENERAL:alert, no distress and comfortable SKIN: skin color, texture, turgor are normal, no rashes or significant lesions EYES: normal, Conjunctiva are pink and non-injected, sclera clear OROPHARYNX:no exudate, no erythema and lips, buccal mucosa, and tongue normal  NECK: supple, thyroid normal size, non-tender, without nodularity LYMPH:  no palpable lymphadenopathy in the cervical, axillary or inguinal LUNGS: clear to auscultation and percussion with normal breathing effort HEART: regular rate & rhythm and no murmurs and no lower extremity edema ABDOMEN:abdomen soft, non-tender and normal bowel sounds Musculoskeletal:no cyanosis of digits and no clubbing  NEURO: alert & oriented x 3 with fluent speech, no focal motor/sensory deficits  LABORATORY  DATA:  I have reviewed the data as listed    Component Value Date/Time   NA 140 08/18/2020 1255   NA 143 07/16/2019 1132   NA 142 08/29/2017 0838   K 4.3 08/18/2020 1255   K 4.2 08/29/2017 0838   CL 107 08/18/2020 1255   CO2 27 08/18/2020 1255   CO2 23 08/29/2017 0838   GLUCOSE 96 08/18/2020 1255   GLUCOSE 97 08/29/2017 0838   BUN 23 08/18/2020 1255   BUN 27 07/16/2019 1132   BUN 24.0 08/29/2017 0838   CREATININE 1.23 (H) 08/18/2020 1255   CREATININE 1.0 08/29/2017 0838   CALCIUM 9.1 08/18/2020 1255   CALCIUM 8.9 08/29/2017 0838   PROT 6.0 (L) 08/18/2020 1255   PROT 6.4 08/29/2017 0838   ALBUMIN 3.8 08/18/2020 1255   ALBUMIN 3.8 08/29/2017 0838   AST 19 08/18/2020 1255   AST 18 08/29/2017 0838   ALT 16 08/18/2020 1255   ALT 16 08/29/2017 0838   ALKPHOS 73 08/18/2020 1255   ALKPHOS 93 08/29/2017 0838   BILITOT 0.6 08/18/2020 1255   BILITOT 0.33 08/29/2017 0838   GFRNONAA 45 (L) 08/18/2020 1255   GFRAA 58 (L) 05/26/2020 1103   GFRAA  39 (L) 07/30/2019 1010    No results found for: SPEP, UPEP  Lab Results  Component Value Date   WBC 4.1 08/18/2020   NEUTROABS 3.0 08/18/2020   HGB 12.3 08/18/2020   HCT 37.3 08/18/2020   MCV 94.7 08/18/2020   PLT 157 08/18/2020      Chemistry      Component Value Date/Time   NA 140 08/18/2020 1255   NA 143 07/16/2019 1132   NA 142 08/29/2017 0838   K 4.3 08/18/2020 1255   K 4.2 08/29/2017 0838   CL 107 08/18/2020 1255   CO2 27 08/18/2020 1255   CO2 23 08/29/2017 0838   BUN 23 08/18/2020 1255   BUN 27 07/16/2019 1132   BUN 24.0 08/29/2017 0838   CREATININE 1.23 (H) 08/18/2020 1255   CREATININE 1.0 08/29/2017 0838      Component Value Date/Time   CALCIUM 9.1 08/18/2020 1255   CALCIUM 8.9 08/29/2017 0838   ALKPHOS 73 08/18/2020 1255   ALKPHOS 93 08/29/2017 0838   AST 19 08/18/2020 1255   AST 18 08/29/2017 0838   ALT 16 08/18/2020 1255   ALT 16 08/29/2017 0838   BILITOT 0.6 08/18/2020 1255   BILITOT 0.33 08/29/2017  0838       RADIOGRAPHIC STUDIES: I have personally reviewed the radiological images as listed and agreed with the findings in the report. DG Chest 2 View  Result Date: 07/24/2020 CLINICAL DATA:  Chest pain EXAM: CHEST - 2 VIEW COMPARISON:  July 23, 2020 CT chest FINDINGS: The heart size and mediastinal contours are within normal limits. A right-sided MediPort catheter seen with the tip in the mid SVC. Both lungs are clear. The visualized skeletal structures are unremarkable. IMPRESSION: No active cardiopulmonary disease. Electronically Signed   By: Prudencio Pair M.D.   On: 07/24/2020 19:34   CT CHEST ABDOMEN PELVIS W CONTRAST  Result Date: 07/23/2020 CLINICAL DATA:  Lymphoma. EXAM: CT CHEST, ABDOMEN, AND PELVIS WITH CONTRAST TECHNIQUE: Multidetector CT imaging of the chest, abdomen and pelvis was performed following the standard protocol during bolus administration of intravenous contrast. CONTRAST:  69m OMNIPAQUE IOHEXOL 300 MG/ML  SOLN COMPARISON:  07/30/2019. FINDINGS: CT CHEST FINDINGS Cardiovascular: Right IJ Port-A-Cath terminates in the low SVC. Atherosclerotic calcification of the aorta and aortic valve. Pulmonic trunk and heart are enlarged. No pericardial effusion. Mediastinum/Nodes: No pathologically enlarged mediastinal, hilar or right axillary lymph nodes. Left subpectoral and axillary lymph nodes measure up to 1.3 cm (2/10), increased from 7 mm on 07/30/2019. Left supraclavicular lymph nodes measure up to 7 mm, new. Esophagus is unremarkable. Lungs/Pleura: Image quality is degraded by respiratory motion and expiratory phase imaging, which creates increased density in the lungs. 2 mm left upper lobe nodule (4/32), unchanged and considered benign. 4 mm left lower lobe nodule (4/79) has a central calcification and is unchanged from 03/24/2017, indicative of a benign lesion. No pleural fluid. Airway is otherwise unremarkable. Musculoskeletal: No worrisome lytic or sclerotic lesions. CT  ABDOMEN PELVIS FINDINGS Hepatobiliary: Liver and gallbladder are unremarkable. No biliary ductal dilatation. Pancreas: Negative. Spleen: Subcentimeter low-attenuation lesion in the spleen is too small to characterize. Adrenals/Urinary Tract: Adrenal glands are unremarkable. Scarring and cortical thinning in the kidneys bilaterally. Kidneys are otherwise unremarkable. Ureters are decompressed. Bladder is low in volume. Stomach/Bowel: Stomach, small bowel and colon are unremarkable. Appendix is not visualized. Vascular/Lymphatic: Atherosclerotic calcification of the aorta without aneurysm. Abdominal retroperitoneal lymph nodes measure up to 10 mm in the low left periaortic and left  common iliac stations (2/70 and 76), increased from 6 mm and 5 mm, respectively. Reproductive: Hysterectomy.  No adnexal mass. Other: No free fluid.  Mesenteries and peritoneum are unremarkable. Musculoskeletal: Degenerative and postoperative changes in the spine. Avascular necrosis in the right femoral head. Degenerative changes in the right hip. IMPRESSION: 1. Recurrent lymphoma as evidenced by enlarging left pectoral and abdominal retroperitoneal lymph nodes. Small left supraclavicular lymph nodes are new. 2. Avascular necrosis in the right femoral head with secondary degenerative osteoarthritis in the right hip. 3.  Aortic atherosclerosis (ICD10-I70.0). 4. Enlarged pulmonic trunk, indicative of pulmonary arterial hypertension. Electronically Signed   By: Lorin Picket M.D.   On: 07/23/2020 10:08

## 2020-08-26 ENCOUNTER — Other Ambulatory Visit: Payer: Medicare HMO

## 2020-08-26 ENCOUNTER — Ambulatory Visit: Payer: Medicare HMO | Admitting: Hematology and Oncology

## 2020-08-29 ENCOUNTER — Telehealth: Payer: Self-pay

## 2020-08-29 NOTE — Telephone Encounter (Signed)
OK - thanks

## 2020-08-29 NOTE — Telephone Encounter (Signed)
Pt called to report rash to arm, legs, and axillary area.    Pt was evaluated by her doctor with the [place].   Per patient, the doctor ruled our shingles. Pt was given Rx for triamcinolone cream.  Pt just wanted MD to be aware.   RN encouraged patient to continue use of cream, and keep follow up 11/15.  Will forward to MD as an FYI.

## 2020-09-01 ENCOUNTER — Encounter: Payer: Self-pay | Admitting: Hematology and Oncology

## 2020-09-01 ENCOUNTER — Telehealth: Payer: Self-pay | Admitting: Hematology and Oncology

## 2020-09-01 ENCOUNTER — Other Ambulatory Visit: Payer: Self-pay

## 2020-09-01 ENCOUNTER — Inpatient Hospital Stay: Payer: No Typology Code available for payment source

## 2020-09-01 ENCOUNTER — Inpatient Hospital Stay (HOSPITAL_BASED_OUTPATIENT_CLINIC_OR_DEPARTMENT_OTHER): Payer: No Typology Code available for payment source | Admitting: Hematology and Oncology

## 2020-09-01 VITALS — BP 126/82 | HR 67 | Temp 98.2°F | Resp 16

## 2020-09-01 DIAGNOSIS — C8238 Follicular lymphoma grade IIIa, lymph nodes of multiple sites: Secondary | ICD-10-CM | POA: Diagnosis not present

## 2020-09-01 DIAGNOSIS — Z5112 Encounter for antineoplastic immunotherapy: Secondary | ICD-10-CM | POA: Diagnosis not present

## 2020-09-01 DIAGNOSIS — R21 Rash and other nonspecific skin eruption: Secondary | ICD-10-CM

## 2020-09-01 DIAGNOSIS — D696 Thrombocytopenia, unspecified: Secondary | ICD-10-CM | POA: Diagnosis not present

## 2020-09-01 DIAGNOSIS — N183 Chronic kidney disease, stage 3 unspecified: Secondary | ICD-10-CM | POA: Diagnosis not present

## 2020-09-01 DIAGNOSIS — Z7189 Other specified counseling: Secondary | ICD-10-CM

## 2020-09-01 LAB — CMP (CANCER CENTER ONLY)
ALT: 14 U/L (ref 0–44)
AST: 18 U/L (ref 15–41)
Albumin: 3.8 g/dL (ref 3.5–5.0)
Alkaline Phosphatase: 79 U/L (ref 38–126)
Anion gap: 8 (ref 5–15)
BUN: 29 mg/dL — ABNORMAL HIGH (ref 8–23)
CO2: 25 mmol/L (ref 22–32)
Calcium: 9 mg/dL (ref 8.9–10.3)
Chloride: 109 mmol/L (ref 98–111)
Creatinine: 1.15 mg/dL — ABNORMAL HIGH (ref 0.44–1.00)
GFR, Estimated: 49 mL/min — ABNORMAL LOW (ref >60)
Glucose, Bld: 98 mg/dL (ref 70–99)
Potassium: 4.2 mmol/L (ref 3.5–5.1)
Sodium: 142 mmol/L (ref 135–145)
Total Bilirubin: 0.7 mg/dL (ref 0.3–1.2)
Total Protein: 6.2 g/dL — ABNORMAL LOW (ref 6.5–8.1)

## 2020-09-01 LAB — CBC WITH DIFFERENTIAL (CANCER CENTER ONLY)
Abs Immature Granulocytes: 0.02 10*3/uL (ref 0.00–0.07)
Basophils Absolute: 0 10*3/uL (ref 0.0–0.1)
Basophils Relative: 1 %
Eosinophils Absolute: 0.4 10*3/uL (ref 0.0–0.5)
Eosinophils Relative: 8 %
HCT: 37.3 % (ref 36.0–46.0)
Hemoglobin: 12 g/dL (ref 12.0–15.0)
Immature Granulocytes: 0 %
Lymphocytes Relative: 6 %
Lymphs Abs: 0.3 10*3/uL — ABNORMAL LOW (ref 0.7–4.0)
MCH: 30.8 pg (ref 26.0–34.0)
MCHC: 32.2 g/dL (ref 30.0–36.0)
MCV: 95.6 fL (ref 80.0–100.0)
Monocytes Absolute: 0.5 10*3/uL (ref 0.1–1.0)
Monocytes Relative: 10 %
Neutro Abs: 3.7 10*3/uL (ref 1.7–7.7)
Neutrophils Relative %: 75 %
Platelet Count: 136 10*3/uL — ABNORMAL LOW (ref 150–400)
RBC: 3.9 MIL/uL (ref 3.87–5.11)
RDW: 14.8 % (ref 11.5–15.5)
WBC Count: 5 10*3/uL (ref 4.0–10.5)
nRBC: 0 % (ref 0.0–0.2)

## 2020-09-01 LAB — URIC ACID: Uric Acid, Serum: 3.1 mg/dL (ref 2.5–7.1)

## 2020-09-01 MED ORDER — SODIUM CHLORIDE 0.9 % IV SOLN
10.0000 mg | Freq: Once | INTRAVENOUS | Status: AC
  Administered 2020-09-01: 10 mg via INTRAVENOUS
  Filled 2020-09-01: qty 10

## 2020-09-01 MED ORDER — PALONOSETRON HCL INJECTION 0.25 MG/5ML
0.2500 mg | Freq: Once | INTRAVENOUS | Status: AC
  Administered 2020-09-01: 0.25 mg via INTRAVENOUS

## 2020-09-01 MED ORDER — ACETAMINOPHEN 325 MG PO TABS
650.0000 mg | ORAL_TABLET | Freq: Once | ORAL | Status: AC
  Administered 2020-09-01: 650 mg via ORAL

## 2020-09-01 MED ORDER — DIPHENHYDRAMINE HCL 25 MG PO CAPS
ORAL_CAPSULE | ORAL | Status: AC
  Filled 2020-09-01: qty 1

## 2020-09-01 MED ORDER — SODIUM CHLORIDE 0.9% FLUSH
10.0000 mL | Freq: Once | INTRAVENOUS | Status: AC
  Administered 2020-09-01: 10 mL
  Filled 2020-09-01: qty 10

## 2020-09-01 MED ORDER — ACETAMINOPHEN 325 MG PO TABS
ORAL_TABLET | ORAL | Status: AC
  Filled 2020-09-01: qty 2

## 2020-09-01 MED ORDER — PALONOSETRON HCL INJECTION 0.25 MG/5ML
INTRAVENOUS | Status: AC
  Filled 2020-09-01: qty 5

## 2020-09-01 MED ORDER — SODIUM CHLORIDE 0.9 % IV SOLN
60.0000 mg/m2 | Freq: Once | INTRAVENOUS | Status: AC
  Administered 2020-09-01: 100 mg via INTRAVENOUS
  Filled 2020-09-01: qty 4

## 2020-09-01 MED ORDER — SODIUM CHLORIDE 0.9% FLUSH
10.0000 mL | INTRAVENOUS | Status: DC | PRN
  Administered 2020-09-01: 3 mL
  Filled 2020-09-01: qty 10

## 2020-09-01 MED ORDER — HEPARIN SOD (PORK) LOCK FLUSH 100 UNIT/ML IV SOLN
500.0000 [IU] | Freq: Once | INTRAVENOUS | Status: AC | PRN
  Administered 2020-09-01: 500 [IU]
  Filled 2020-09-01: qty 5

## 2020-09-01 MED ORDER — DIPHENHYDRAMINE HCL 25 MG PO CAPS
25.0000 mg | ORAL_CAPSULE | Freq: Once | ORAL | Status: AC
  Administered 2020-09-01: 25 mg via ORAL

## 2020-09-01 MED ORDER — SODIUM CHLORIDE 0.9 % IV SOLN
Freq: Once | INTRAVENOUS | Status: AC
  Filled 2020-09-01: qty 250

## 2020-09-01 MED ORDER — SODIUM CHLORIDE 0.9 % IV SOLN
375.0000 mg/m2 | Freq: Once | INTRAVENOUS | Status: AC
  Administered 2020-09-01: 600 mg via INTRAVENOUS
  Filled 2020-09-01: qty 50

## 2020-09-01 NOTE — Assessment & Plan Note (Signed)
Her examination is benign Overall, she tolerated treatment well She is reassured I recommend we proceed with 3 cycles of treatment before repeating imaging study early next year

## 2020-09-01 NOTE — Assessment & Plan Note (Signed)
She has very faint macular skin rash throughout I could not say for sure which medication would cause that I recommend observation versus short course of prednisone therapy The patient elected to be observe only for now

## 2020-09-01 NOTE — Progress Notes (Signed)
[place] OFFICE PROGRESS NOTE  Patient Care Team: Domenick Bookbinder, MD as PCP - General (Internal Medicine) [name], MD as PCP - Cardiology (Cardiology) Carola Frost, RN as Registered Nurse ([place]) [name], MD as Attending Physician (Internal Medicine)  ASSESSMENT & PLAN:  Grade 3a follicular lymphoma of lymph nodes of multiple regions [facility]) Her examination is benign Overall, she tolerated treatment well She is reassured I recommend we proceed with 3 cycles of treatment before repeating imaging study early next year  Thrombocytopenia [facility]) This is likely due to recent treatment. The patient denies recent history of bleeding such as epistaxis, hematuria or hematochezia. She is asymptomatic from the low platelet count. I will observe for now.  she does not require transfusion now. I will continue the chemotherapy at current dose without dosage adjustment.  If the thrombocytopenia gets progressive worse in the future, I might have to delay her treatment or adjust the chemotherapy dose.    Chronic kidney disease, stage III (moderate) (HCC) She has chronic kidney disease stage III.  This is stable Continue medical management  Skin rash She has very faint macular skin rash throughout I could not say for sure which medication would cause that I recommend observation versus short course of prednisone therapy The patient elected to be observe only for now   No orders of the defined types were placed in this encounter.   All questions were answered. The patient knows to call the clinic with any problems, questions or concerns. The total time spent in the appointment was 20 minutes encounter with patients including review of chart and various tests results, discussions about plan of care and coordination of care plan   [name], MD 09/01/2020 12:22 PM  INTERVAL HISTORY: Please see below for problem oriented charting. She returns to be  seen prior to cycle 2 of treatment She felt better since last time I saw her No new lymphadenopathy No recent infection, fever or chills The patient denies any recent signs or symptoms of bleeding such as spontaneous epistaxis, hematuria or hematochezia. She noticed a faint rash affecting her torso and legs but it is not itchy  SUMMARY OF ONCOLOGIC HISTORY: Oncology History  Grade 3a follicular lymphoma of lymph nodes of multiple regions ([place])  01/21/2015 Surgery   She underwent excisional lymph node biopsy that came back follicular lymphoma grade 3   01/21/2015 Pathology Results   Accession: [id number] biopsy confirmed follicular lymphoma   4/85/4627 Imaging   Echocardiogram showed ejection fraction of 55-60%   02/11/2015 Imaging    PET CT scan show possible splenic involvement and diffuse lymphadenopathy throughout   02/11/2015 Bone Marrow Biopsy    bone marrow biopsy was performed and is involved by lymphoma with translocation of igH/BCL2   02/13/2015 Procedure   She had port placement.   02/17/2015 - 06/02/2015 Chemotherapy   She received R-CHOP chemo x 6   02/17/2015 Adverse Reaction   She had mild infusion reaction with cycle 1 of treatment.   04/18/2015 Imaging   PET CT scan showed near complete response to treatment.   04/22/2015 Adverse Reaction   Vincristine dose was reduced by 50% due to neuropathy from cycle 4 onwards   06/25/2015 - 07/06/2015 Hospital Admission   She was hospitalized for recent sepsis/bacteremia and a fib with RVR   07/11/2015 Imaging   repeat PEt scan showed complete response to Rx   07/14/2015 - 06/16/2017 Chemotherapy   She received maintenance Rituximab every 60  days   12/15/2015 Imaging   PET CT showed no evidence of cancer recurrence   06/15/2016 PET scan   No evidence of active lymphoma on skullbase to thigh FDG PET scan. Small LEFT periaortic retroperitoneal lymph nodes without significant metabolic activity ( Deauville 1). No change from prior    03/24/2017 Imaging   1. Borderline enlarged abdominal retroperitoneal lymph nodes, stable. No new adenopathy in the chest, abdomen or pelvis. 2. Aortic atherosclerosis (ICD10-170.0). 3. Enlarged pulmonary arteries, indicative of pulmonary arterial hypertension.   09/14/2017 PET scan   Stable exam. No evidence of active lymphoma or other acute findings. (Deauville score 1)   08/08/2018 PET scan   1. Evidence of progressive/recurrent disease, as evidenced by new hypermetabolic nodes and subcutaneous nodule/nodes throughout left chest and abdomen. (Deauville 5). 2. Likely physiologic right nasopharyngeal hypermetabolism. Recommend attention on follow-up. 3. Incidental findings, including pulmonary artery enlargement, suggesting pulmonary arterial hypertension, stable left lower lobe pulmonary nodule, and aortic Atherosclerosis (ICD10-I70.0).   08/29/2018 Initial Biopsy   Soft tissue simple excision left back: Follicular lymphoma grade 1-2 positive for CD20, PAX5, bcl-6, and bcl-2.  Ki-67 30%   09/11/2018 - 02/20/2019 Chemotherapy   The patient had Bendamustine and Rituximab   12/13/2018 Imaging   1. Response to therapy, as evidenced by resolution of left chest wall nodularity and decrease in size of small left axillary nodes. 2. No residual soft tissue thickening at the site of hypermetabolism along the posterior left eleventh rib. Resolution of adjacent subcutaneous nodularity. 3. No new or progressive disease identified. 4.  Aortic Atherosclerosis (ICD10-I70.0). 5. Bilateral pulmonary nodules, similar. 6. Trace air within the urinary bladder could be iatrogenic. Possible pericystic edema. Correlate with symptoms of cystitis and recent instrumentation.   07/30/2019 Imaging   Ct chest, abdomen and pelvis No findings suspicious for active lymphoma in the chest, abdomen, or pelvis.   Small bilateral pulmonary nodules, unchanged, benign.   Again noted is trace nondependent gas within the  bladder. Correlate for recent intervention.   07/09/2020 Imaging   MRI imaging performed [facility] on the left shoulder reviewed significant lymphadenopathy, worrisome for recurrent lymphoma   07/23/2020 Imaging   1. Recurrent lymphoma as evidenced by enlarging left pectoral and abdominal retroperitoneal lymph nodes. Small left supraclavicular lymph nodes are new. 2. Avascular necrosis in the right femoral head with secondary degenerative osteoarthritis in the right hip. 3.  Aortic atherosclerosis (ICD10-I70.0). 4. Enlarged pulmonic trunk, indicative of pulmonary arterial hypertension.     08/04/2020 -  Chemotherapy   The patient had Bendamustine and Rituxan     REVIEW OF SYSTEMS:   Constitutional: Denies fevers, chills or abnormal weight loss Eyes: Denies blurriness of vision Ears, nose, mouth, throat, and face: Denies mucositis or sore throat Respiratory: Denies cough, dyspnea or wheezes Cardiovascular: Denies palpitation, chest discomfort or lower extremity swelling Gastrointestinal:  Denies nausea, heartburn or change in bowel habits Lymphatics: Denies new lymphadenopathy or easy bruising Neurological:Denies numbness, tingling or new weaknesses Behavioral/Psych: Mood is stable, no new changes  All other systems were reviewed with the patient and are negative.  I have reviewed the past medical history, past surgical history, social history and family history with the patient and they are unchanged from previous note.  ALLERGIES:  is allergic to amiodarone.  MEDICATIONS:  Current Outpatient Medications  Medication Sig Dispense Refill  . acetaminophen (TYLENOL) 500 MG tablet Take 500 mg by mouth every 6 (six) hours as needed for moderate pain or headache.    Marland Kitchen  acyclovir (ZOVIRAX) 400 MG tablet Take 1 tablet (400 mg total) by mouth daily. 30 tablet 3  . allopurinol (ZYLOPRIM) 300 MG tablet Take 1 tablet (300 mg total) by mouth daily. 30 tablet 3  . apixaban (ELIQUIS) 5 MG TABS  tablet Take 5 mg by mouth 2 (two) times daily.     . bisacodyl (DULCOLAX) 5 MG EC tablet Take 5 mg by mouth daily as needed for moderate constipation.    . diclofenac sodium (VOLTAREN) 1 % GEL Apply 2 g topically 3 (three) times daily as needed (knee pain). 100 g 0  . levothyroxine (SYNTHROID, LEVOTHROID) 112 MCG tablet Take 112 mcg by mouth daily.   9  . lidocaine-prilocaine (EMLA) cream Apply to affected area once 30 g 3  . metoprolol succinate (TOPROL-XL) 25 MG 24 hr tablet Take 0.5 tablets (12.5 mg total) by mouth daily. Take with or immediately following a meal. 90 tablet 3  . ondansetron (ZOFRAN) 8 MG tablet Take 1 tablet (8 mg total) by mouth every 8 (eight) hours as needed for refractory nausea / vomiting. Start on day 2 after bendamustine chemo. 30 tablet 1  . polyvinyl alcohol (LIQUIFILM TEARS) 1.4 % ophthalmic solution Place 1 drop into both eyes as needed for dry eyes.    Marland Kitchen prochlorperazine (COMPAZINE) 10 MG tablet Take 1 tablet (10 mg total) by mouth every 6 (six) hours as needed (Nausea or vomiting). 30 tablet 1   No current facility-administered medications for this visit.   Facility-Administered Medications Ordered in Other Visits  Medication Dose Route Frequency Provider Last Rate Last Admin  . ondansetron (ZOFRAN) 8 mg in sodium chloride 0.9 % 50 mL IVPB   Intravenous Once Kent Braunschweig, MD      . sodium chloride 0.9 % injection 10 mL  10 mL Intravenous PRN Alvy Bimler, Akul Leggette, MD   10 mL at 04/05/17 1315    PHYSICAL EXAMINATION: ECOG PERFORMANCE STATUS: 1 - Symptomatic but completely ambulatory  Vitals:   09/01/20 1120  BP: 125/61  Pulse: 87  Resp: 18  Temp: 97.9 F (36.6 C)  SpO2: 100%   Filed Weights   09/01/20 1120  Weight: 138 lb 12.8 oz (63 kg)    GENERAL:alert, no distress and comfortable SKIN: Noted in eczematous skin rash throughout her body EYES: normal, Conjunctiva are pink and non-injected, sclera clear OROPHARYNX:no exudate, no erythema and lips, buccal  mucosa, and tongue normal  NECK: supple, thyroid normal size, non-tender, without nodularity LYMPH:  no palpable lymphadenopathy in the cervical, axillary or inguinal LUNGS: clear to auscultation and percussion with normal breathing effort HEART: regular rate & rhythm and no murmurs and no lower extremity edema ABDOMEN:abdomen soft, non-tender and normal bowel sounds Musculoskeletal:no cyanosis of digits and no clubbing  NEURO: alert & oriented x 3 with fluent speech, no focal motor/sensory deficits  LABORATORY DATA:  I have reviewed the data as listed    Component Value Date/Time   NA 142 09/01/2020 1102   NA 143 07/16/2019 1132   NA 142 08/29/2017 0838   K 4.2 09/01/2020 1102   K 4.2 08/29/2017 0838   CL 109 09/01/2020 1102   CO2 25 09/01/2020 1102   CO2 23 08/29/2017 0838   GLUCOSE 98 09/01/2020 1102   GLUCOSE 97 08/29/2017 0838   BUN 29 (H) 09/01/2020 1102   BUN 27 07/16/2019 1132   BUN 24.0 08/29/2017 0838   CREATININE 1.15 (H) 09/01/2020 1102   CREATININE 1.0 08/29/2017 0838   CALCIUM 9.0 09/01/2020 1102  CALCIUM 8.9 08/29/2017 0838   PROT 6.2 (L) 09/01/2020 1102   PROT 6.4 08/29/2017 0838   ALBUMIN 3.8 09/01/2020 1102   ALBUMIN 3.8 08/29/2017 0838   AST 18 09/01/2020 1102   AST 18 08/29/2017 0838   ALT 14 09/01/2020 1102   ALT 16 08/29/2017 0838   ALKPHOS 79 09/01/2020 1102   ALKPHOS 93 08/29/2017 0838   BILITOT 0.7 09/01/2020 1102   BILITOT 0.33 08/29/2017 0838   GFRNONAA 49 (L) 09/01/2020 1102   GFRAA 58 (L) 05/26/2020 1103   GFRAA 39 (L) 07/30/2019 1010    No results found for: SPEP, UPEP  Lab Results  Component Value Date   WBC 5.0 09/01/2020   NEUTROABS 3.7 09/01/2020   HGB 12.0 09/01/2020   HCT 37.3 09/01/2020   MCV 95.6 09/01/2020   PLT 136 (L) 09/01/2020      Chemistry      Component Value Date/Time   NA 142 09/01/2020 1102   NA 143 07/16/2019 1132   NA 142 08/29/2017 0838   K 4.2 09/01/2020 1102   K 4.2 08/29/2017 0838   CL 109  09/01/2020 1102   CO2 25 09/01/2020 1102   CO2 23 08/29/2017 0838   BUN 29 (H) 09/01/2020 1102   BUN 27 07/16/2019 1132   BUN 24.0 08/29/2017 0838   CREATININE 1.15 (H) 09/01/2020 1102   CREATININE 1.0 08/29/2017 0838      Component Value Date/Time   CALCIUM 9.0 09/01/2020 1102   CALCIUM 8.9 08/29/2017 0838   ALKPHOS 79 09/01/2020 1102   ALKPHOS 93 08/29/2017 0838   AST 18 09/01/2020 1102   AST 18 08/29/2017 0838   ALT 14 09/01/2020 1102   ALT 16 08/29/2017 0838   BILITOT 0.7 09/01/2020 1102   BILITOT 0.33 08/29/2017 9774

## 2020-09-01 NOTE — Telephone Encounter (Signed)
Scheduled appts per 11/15 sch msg. Gave pt a print out of AVS.

## 2020-09-01 NOTE — Assessment & Plan Note (Signed)
She has chronic kidney disease stage III.  This is stable Continue medical management 

## 2020-09-01 NOTE — Patient Instructions (Signed)
[facility] Discharge Instructions for Patients Receiving Chemotherapy  Today you received the following chemotherapy agents:  Rituximab and Bendamustine Verl Dicker)  To help prevent nausea and vomiting after your treatment, we encourage you to take your nausea medication as directed by your MD.   If you develop nausea and vomiting that is not controlled by your nausea medication, call the clinic.   BELOW ARE SYMPTOMS THAT SHOULD BE REPORTED IMMEDIATELY:  *FEVER GREATER THAN 100.5 F  *CHILLS WITH OR WITHOUT FEVER  NAUSEA AND VOMITING THAT IS NOT CONTROLLED WITH YOUR NAUSEA MEDICATION  *UNUSUAL SHORTNESS OF BREATH  *UNUSUAL BRUISING OR BLEEDING  TENDERNESS IN MOUTH AND THROAT WITH OR WITHOUT PRESENCE OF ULCERS  *URINARY PROBLEMS  *BOWEL PROBLEMS  UNUSUAL RASH Items with * indicate a potential emergency and should be followed up as soon as possible.  Feel free to call the clinic should you have any questions or concerns. The clinic phone number is [phone].  Please show the [facility] at check-in to the Emergency Department and triage nurse.  Rituximab injection What is this medicine? RITUXIMAB (ri TUX i mab) is a monoclonal antibody. It is used to treat certain types of cancer like non-Hodgkin lymphoma and chronic lymphocytic leukemia. It is also used to treat rheumatoid arthritis, granulomatosis with polyangiitis (or Wegener's granulomatosis), microscopic polyangiitis, and pemphigus vulgaris. This medicine may be used for other purposes; ask your health care provider or pharmacist if you have questions. COMMON BRAND NAME(S): Rituxan, RUXIENCE What should I tell my health care provider before I take this medicine? They need to know if you have any of these conditions:  heart disease  infection (especially a virus infection such as hepatitis B, chickenpox, cold sores, or herpes)  immune system problems  irregular heartbeat  kidney disease  low  blood counts, like low white cell, platelet, or red cell counts  lung or breathing disease, like asthma  recently received or scheduled to receive a vaccine  an unusual or allergic reaction to rituximab, other medicines, foods, dyes, or preservatives  pregnant or trying to get pregnant  breast-feeding How should I use this medicine? This medicine is for infusion into a vein. It is administered in a hospital or clinic by a specially trained health care professional. A special MedGuide will be given to you by the pharmacist with each prescription and refill. Be sure to read this information carefully each time. Talk to your pediatrician regarding the use of this medicine in children. This medicine is not approved for use in children. Overdosage: If you think you have taken too much of this medicine contact a poison control center or emergency room at once. NOTE: This medicine is only for you. Do not share this medicine with others. What if I miss a dose? It is important not to miss a dose. Call your doctor or health care professional if you are unable to keep an appointment. What may interact with this medicine?  cisplatin  live virus vaccines This list may not describe all possible interactions. Give your health care provider a list of all the medicines, herbs, non-prescription drugs, or dietary supplements you use. Also tell them if you smoke, drink alcohol, or use illegal drugs. Some items may interact with your medicine. What should I watch for while using this medicine? Your condition will be monitored carefully while you are receiving this medicine. You may need blood work done while you are taking this medicine. This medicine can cause serious allergic  reactions. To reduce your risk you may need to take medicine before treatment with this medicine. Take your medicine as directed. In some patients, this medicine may cause a serious brain infection that may cause death. If you have any  problems seeing, thinking, speaking, walking, or standing, tell your healthcare professional right away. If you cannot reach your healthcare professional, urgently seek other source of medical care. Call your doctor or health care professional for advice if you get a fever, chills or sore throat, or other symptoms of a cold or flu. Do not treat yourself. This drug decreases your body's ability to fight infections. Try to avoid being around people who are sick. Do not become pregnant while taking this medicine or for at least 12 months after stopping it. Women should inform their doctor if they wish to become pregnant or think they might be pregnant. There is a potential for serious side effects to an unborn child. Talk to your health care professional or pharmacist for more information. Do not breast-feed an infant while taking this medicine or for at least 6 months after stopping it. What side effects may I notice from receiving this medicine? Side effects that you should report to your doctor or health care professional as soon as possible:  allergic reactions like skin rash, itching or hives; swelling of the face, lips, or tongue  breathing problems  chest pain  changes in vision  diarrhea  headache with fever, neck stiffness, sensitivity to light, nausea, or confusion  fast, irregular heartbeat  loss of memory  low blood counts - this medicine may decrease the number of white blood cells, red blood cells and platelets. You may be at increased risk for infections and bleeding.  mouth sores  problems with balance, talking, or walking  redness, blistering, peeling or loosening of the skin, including inside the mouth  signs of infection - fever or chills, cough, sore throat, pain or difficulty passing urine  signs and symptoms of kidney injury like trouble passing urine or change in the amount of urine  signs and symptoms of liver injury like dark yellow or brown urine; general ill  feeling or flu-like symptoms; light-colored stools; loss of appetite; nausea; right upper belly pain; unusually weak or tired; yellowing of the eyes or skin  signs and symptoms of low blood pressure like dizziness; feeling faint or lightheaded, falls; unusually weak or tired  stomach pain  swelling of the ankles, feet, hands  unusual bleeding or bruising  vomiting Side effects that usually do not require medical attention (report to your doctor or health care professional if they continue or are bothersome):  headache  joint pain  muscle cramps or muscle pain  nausea  tiredness This list may not describe all possible side effects. Call your doctor for medical advice about side effects. You may report side effects to FDA at 1-800-FDA-1088. Where should I keep my medicine? This drug is given in a hospital or clinic and will not be stored at home. NOTE: This sheet is a summary. It may not cover all possible information. If you have questions about this medicine, talk to your doctor, pharmacist, or health care provider.  2020 Elsevier/Gold Standard (2018-11-15 22:01:36)  Bendamustine Injection What is this medicine? BENDAMUSTINE (BEN da MUS teen) is a chemotherapy drug. It is used to treat chronic lymphocytic leukemia and non-Hodgkin lymphoma. This medicine may be used for other purposes; ask your health care provider or pharmacist if you have questions. COMMON BRAND NAME(S):  Kristine Royal, Treanda What should I tell my health care provider before I take this medicine? They need to know if you have any of these conditions:  infection (especially a virus infection such as chickenpox, cold sores, or herpes)  kidney disease  liver disease  an unusual or allergic reaction to bendamustine, mannitol, other medicines, foods, dyes, or preservatives  pregnant or trying to get pregnant  breast-feeding How should I use this medicine? This medicine is for infusion into a vein. It  is given by a health care professional in a hospital or clinic setting. Talk to your pediatrician regarding the use of this medicine in children. Special care may be needed. Overdosage: If you think you have taken too much of this medicine contact a poison control center or emergency room at once. NOTE: This medicine is only for you. Do not share this medicine with others. What if I miss a dose? It is important not to miss your dose. Call your doctor or health care professional if you are unable to keep an appointment. What may interact with this medicine? Do not take this medicine with any of the following medications:  clozapine This medicine may also interact with the following medications:  atazanavir  cimetidine  ciprofloxacin  enoxacin  fluvoxamine  medicines for seizures like carbamazepine and phenobarbital  mexiletine  rifampin  tacrine  thiabendazole  zileuton This list may not describe all possible interactions. Give your health care provider a list of all the medicines, herbs, non-prescription drugs, or dietary supplements you use. Also tell them if you smoke, drink alcohol, or use illegal drugs. Some items may interact with your medicine. What should I watch for while using this medicine? This drug may make you feel generally unwell. This is not uncommon, as chemotherapy can affect healthy cells as well as cancer cells. Report any side effects. Continue your course of treatment even though you feel ill unless your doctor tells you to stop. You may need blood work done while you are taking this medicine. Call your doctor or healthcare provider for advice if you get a fever, chills or sore throat, or other symptoms of a cold or flu. Do not treat yourself. This drug decreases your body's ability to fight infections. Try to avoid being around people who are sick. This medicine may cause serious skin reactions. They can happen weeks to months after starting the medicine.  Contact your healthcare provider right away if you notice fevers or flu-like symptoms with a rash. The rash may be red or purple and then turn into blisters or peeling of the skin. Or, you might notice a red rash with swelling of the face, lips or lymph nodes in your neck or under your arms. This medicine may increase your risk to bruise or bleed. Call your doctor or healthcare provider if you notice any unusual bleeding. Talk to your doctor about your risk of cancer. You may be more at risk for certain types of cancers if you take this medicine. Do not become pregnant while taking this medicine or for at least 6 months after stopping it. Women should inform their doctor if they wish to become pregnant or think they might be pregnant. Men should not father a child while taking this medicine and for at least 3 months after stopping it. There is a potential for serious side effects to an unborn child. Talk to your healthcare provider or pharmacist for more information. Do not breast-feed an infant while taking this medicine  or for at least 1 week after stopping it. This medicine may make it more difficult to father a child. You should talk with your doctor or healthcare provider if you are concerned about your fertility. What side effects may I notice from receiving this medicine? Side effects that you should report to your doctor or health care professional as soon as possible:  allergic reactions like skin rash, itching or hives, swelling of the face, lips, or tongue  low blood counts - this medicine may decrease the number of white blood cells, red blood cells and platelets. You may be at increased risk for infections and bleeding.  rash, fever, and swollen lymph nodes  redness, blistering, peeling, or loosening of the skin, including inside the mouth  signs of infection like fever or chills, cough, sore throat, pain or difficulty passing urine  signs of decreased platelets or bleeding like  bruising, pinpoint red spots on the skin, black, tarry stools, blood in the urine  signs of decreased red blood cells like being unusually weak or tired, fainting spells, lightheadedness  signs and symptoms of kidney injury like trouble passing urine or change in the amount of urine  signs and symptoms of liver injury like dark yellow or brown urine; general ill feeling or flu-like symptoms; light-colored stools; loss of appetite; nausea; right upper belly pain; unusually weak or tired; yellowing of the eyes or skin Side effects that usually do not require medical attention (report to your doctor or health care professional if they continue or are bothersome):  constipation  decreased appetite  diarrhea  headache  mouth sores  nausea, vomiting  tiredness This list may not describe all possible side effects. Call your doctor for medical advice about side effects. You may report side effects to FDA at 1-800-FDA-1088. Where should I keep my medicine? This drug is given in a hospital or clinic and will not be stored at home. NOTE: This sheet is a summary. It may not cover all possible information. If you have questions about this medicine, talk to your doctor, pharmacist, or health care provider.  2020 Elsevier/Gold Standard (2018-12-26 10:26:46)

## 2020-09-01 NOTE — Assessment & Plan Note (Signed)
This is likely due to recent treatment. The patient denies recent history of bleeding such as epistaxis, hematuria or hematochezia. She is asymptomatic from the low platelet count. I will observe for now.  she does not require transfusion now. I will continue the chemotherapy at current dose without dosage adjustment.  If the thrombocytopenia gets progressive worse in the future, I might have to delay her treatment or adjust the chemotherapy dose.   

## 2020-09-02 ENCOUNTER — Other Ambulatory Visit: Payer: Self-pay

## 2020-09-02 ENCOUNTER — Inpatient Hospital Stay: Payer: No Typology Code available for payment source

## 2020-09-02 VITALS — BP 117/61 | HR 66 | Temp 98.0°F | Resp 16

## 2020-09-02 DIAGNOSIS — Z5112 Encounter for antineoplastic immunotherapy: Secondary | ICD-10-CM | POA: Diagnosis not present

## 2020-09-02 DIAGNOSIS — Z7189 Other specified counseling: Secondary | ICD-10-CM

## 2020-09-02 DIAGNOSIS — C8238 Follicular lymphoma grade IIIa, lymph nodes of multiple sites: Secondary | ICD-10-CM

## 2020-09-02 MED ORDER — SODIUM CHLORIDE 0.9 % IV SOLN
Freq: Once | INTRAVENOUS | Status: AC
  Filled 2020-09-02: qty 250

## 2020-09-02 MED ORDER — HEPARIN SOD (PORK) LOCK FLUSH 100 UNIT/ML IV SOLN
500.0000 [IU] | Freq: Once | INTRAVENOUS | Status: AC | PRN
  Administered 2020-09-02: 500 [IU]
  Filled 2020-09-02: qty 5

## 2020-09-02 MED ORDER — SODIUM CHLORIDE 0.9% FLUSH
10.0000 mL | INTRAVENOUS | Status: DC | PRN
  Administered 2020-09-02: 10 mL
  Filled 2020-09-02: qty 10

## 2020-09-02 MED ORDER — SODIUM CHLORIDE 0.9 % IV SOLN
60.0000 mg/m2 | Freq: Once | INTRAVENOUS | Status: AC
  Administered 2020-09-02: 100 mg via INTRAVENOUS
  Filled 2020-09-02: qty 4

## 2020-09-02 MED ORDER — SODIUM CHLORIDE 0.9 % IV SOLN
10.0000 mg | Freq: Once | INTRAVENOUS | Status: AC
  Administered 2020-09-02: 10 mg via INTRAVENOUS
  Filled 2020-09-02: qty 10

## 2020-09-02 NOTE — Patient Instructions (Signed)
[facility] Discharge Instructions for Patients Receiving Chemotherapy  Today you received the following chemotherapy agents Bendeka.   To help prevent nausea and vomiting after your treatment, we encourage you to take your nausea medication as prescribed.    If you develop nausea and vomiting that is not controlled by your nausea medication, call the clinic.   BELOW ARE SYMPTOMS THAT SHOULD BE REPORTED IMMEDIATELY:  *FEVER GREATER THAN 100.5 F  *CHILLS WITH OR WITHOUT FEVER  NAUSEA AND VOMITING THAT IS NOT CONTROLLED WITH YOUR NAUSEA MEDICATION  *UNUSUAL SHORTNESS OF BREATH  *UNUSUAL BRUISING OR BLEEDING  TENDERNESS IN MOUTH AND THROAT WITH OR WITHOUT PRESENCE OF ULCERS  *URINARY PROBLEMS  *BOWEL PROBLEMS  UNUSUAL RASH Items with * indicate a potential emergency and should be followed up as soon as possible.  Feel free to call the clinic should you have any questions or concerns. The clinic phone number is [phone].  Please show the CHEMO ALERT CARD at check-in to the Emergency Department and triage nurse.  

## 2020-09-23 NOTE — Progress Notes (Signed)
Rapid Infusion Rituximab Pharmacist Evaluation  Carrie [name] is a 79 y.o. female being treated with rituximab for follicular lymphoma. This patient may be considered for RIR.   A pharmacist has verified the patient tolerated rituximab infusions per the [name] standard infusion protocol without grade 3-4 infusion reactions. The treatment plan will be updated to reflect RIR if the patient qualifies per the checklist below:   Age > 71 years old Yes   Clinically significant cardiovascular disease No   Circulating lymphocyte count < 5000/uL prior to cycle two Yes  Lab Results  Component Value Date   LYMPHSABS 0.3 (L) 09/01/2020    Prior documented grade 3-4 infusion reaction to rituximab No   Prior documented grade 1-2 infusion reaction to rituximab (If YES, Pharmacist will confirm with Physician if patient is still a candidate for RIR) No   Previous rituximab infusion within the past 6 months Yes   Treatment Plan updated orders to reflect RIR Yes    Carrie [name] does meet the criteria for Rapid Infusion Rituximab. This patient is going to be switched to rapid infusion rituximab.   Carrie [name], Pharm.D., CPP 09/23/2020@3 :17 PM

## 2020-09-29 ENCOUNTER — Inpatient Hospital Stay: Payer: No Typology Code available for payment source

## 2020-09-29 ENCOUNTER — Other Ambulatory Visit: Payer: Self-pay

## 2020-09-29 ENCOUNTER — Inpatient Hospital Stay: Payer: No Typology Code available for payment source | Attending: Hematology and Oncology

## 2020-09-29 ENCOUNTER — Encounter: Payer: Self-pay | Admitting: Hematology and Oncology

## 2020-09-29 ENCOUNTER — Inpatient Hospital Stay (HOSPITAL_BASED_OUTPATIENT_CLINIC_OR_DEPARTMENT_OTHER): Payer: No Typology Code available for payment source | Admitting: Hematology and Oncology

## 2020-09-29 VITALS — BP 126/68 | HR 70 | Temp 97.2°F | Resp 17 | Ht 61.0 in | Wt 139.6 lb

## 2020-09-29 VITALS — BP 124/74 | HR 67 | Temp 98.6°F | Resp 18

## 2020-09-29 DIAGNOSIS — D631 Anemia in chronic kidney disease: Secondary | ICD-10-CM | POA: Diagnosis not present

## 2020-09-29 DIAGNOSIS — Z7189 Other specified counseling: Secondary | ICD-10-CM

## 2020-09-29 DIAGNOSIS — D638 Anemia in other chronic diseases classified elsewhere: Secondary | ICD-10-CM | POA: Diagnosis not present

## 2020-09-29 DIAGNOSIS — C8238 Follicular lymphoma grade IIIa, lymph nodes of multiple sites: Secondary | ICD-10-CM

## 2020-09-29 DIAGNOSIS — Z5112 Encounter for antineoplastic immunotherapy: Secondary | ICD-10-CM | POA: Insufficient documentation

## 2020-09-29 DIAGNOSIS — N183 Chronic kidney disease, stage 3 unspecified: Secondary | ICD-10-CM | POA: Insufficient documentation

## 2020-09-29 DIAGNOSIS — Z79899 Other long term (current) drug therapy: Secondary | ICD-10-CM | POA: Diagnosis not present

## 2020-09-29 DIAGNOSIS — Z5111 Encounter for antineoplastic chemotherapy: Secondary | ICD-10-CM | POA: Insufficient documentation

## 2020-09-29 LAB — CMP (CANCER CENTER ONLY)
ALT: 12 U/L (ref 0–44)
AST: 17 U/L (ref 15–41)
Albumin: 3.7 g/dL (ref 3.5–5.0)
Alkaline Phosphatase: 71 U/L (ref 38–126)
Anion gap: 5 (ref 5–15)
BUN: 21 mg/dL (ref 8–23)
CO2: 27 mmol/L (ref 22–32)
Calcium: 8.9 mg/dL (ref 8.9–10.3)
Chloride: 106 mmol/L (ref 98–111)
Creatinine: 1.14 mg/dL — ABNORMAL HIGH (ref 0.44–1.00)
GFR, Estimated: 50 mL/min — ABNORMAL LOW (ref >60)
Glucose, Bld: 127 mg/dL — ABNORMAL HIGH (ref 70–99)
Potassium: 4.4 mmol/L (ref 3.5–5.1)
Sodium: 138 mmol/L (ref 135–145)
Total Bilirubin: 0.8 mg/dL (ref 0.3–1.2)
Total Protein: 6.1 g/dL — ABNORMAL LOW (ref 6.5–8.1)

## 2020-09-29 LAB — CBC WITH DIFFERENTIAL (CANCER CENTER ONLY)
Abs Immature Granulocytes: 0.01 10*3/uL (ref 0.00–0.07)
Basophils Absolute: 0 10*3/uL (ref 0.0–0.1)
Basophils Relative: 0 %
Eosinophils Absolute: 0.2 10*3/uL (ref 0.0–0.5)
Eosinophils Relative: 5 %
HCT: 35.2 % — ABNORMAL LOW (ref 36.0–46.0)
Hemoglobin: 11.8 g/dL — ABNORMAL LOW (ref 12.0–15.0)
Immature Granulocytes: 0 %
Lymphocytes Relative: 4 %
Lymphs Abs: 0.2 10*3/uL — ABNORMAL LOW (ref 0.7–4.0)
MCH: 32.1 pg (ref 26.0–34.0)
MCHC: 33.5 g/dL (ref 30.0–36.0)
MCV: 95.7 fL (ref 80.0–100.0)
Monocytes Absolute: 0.5 10*3/uL (ref 0.1–1.0)
Monocytes Relative: 11 %
Neutro Abs: 4 10*3/uL (ref 1.7–7.7)
Neutrophils Relative %: 80 %
Platelet Count: 169 10*3/uL (ref 150–400)
RBC: 3.68 MIL/uL — ABNORMAL LOW (ref 3.87–5.11)
RDW: 15.4 % (ref 11.5–15.5)
WBC Count: 5 10*3/uL (ref 4.0–10.5)
nRBC: 0 % (ref 0.0–0.2)

## 2020-09-29 LAB — URIC ACID: Uric Acid, Serum: 3.2 mg/dL (ref 2.5–7.1)

## 2020-09-29 MED ORDER — PALONOSETRON HCL INJECTION 0.25 MG/5ML
INTRAVENOUS | Status: AC
  Filled 2020-09-29: qty 5

## 2020-09-29 MED ORDER — SODIUM CHLORIDE 0.9 % IV SOLN
60.0000 mg/m2 | Freq: Once | INTRAVENOUS | Status: AC
  Administered 2020-09-29: 100 mg via INTRAVENOUS
  Filled 2020-09-29: qty 4

## 2020-09-29 MED ORDER — SODIUM CHLORIDE 0.9% FLUSH
10.0000 mL | INTRAVENOUS | Status: DC | PRN
  Administered 2020-09-29: 10 mL
  Filled 2020-09-29: qty 10

## 2020-09-29 MED ORDER — DIPHENHYDRAMINE HCL 25 MG PO CAPS
ORAL_CAPSULE | ORAL | Status: AC
  Filled 2020-09-29: qty 1

## 2020-09-29 MED ORDER — HEPARIN SOD (PORK) LOCK FLUSH 100 UNIT/ML IV SOLN
500.0000 [IU] | Freq: Once | INTRAVENOUS | Status: AC | PRN
  Administered 2020-09-29: 500 [IU]
  Filled 2020-09-29: qty 5

## 2020-09-29 MED ORDER — PALONOSETRON HCL INJECTION 0.25 MG/5ML
0.2500 mg | Freq: Once | INTRAVENOUS | Status: AC
  Administered 2020-09-29: 0.25 mg via INTRAVENOUS

## 2020-09-29 MED ORDER — SODIUM CHLORIDE 0.9 % IV SOLN
375.0000 mg/m2 | Freq: Once | INTRAVENOUS | Status: AC
  Administered 2020-09-29: 600 mg via INTRAVENOUS
  Filled 2020-09-29: qty 50

## 2020-09-29 MED ORDER — ACETAMINOPHEN 325 MG PO TABS
ORAL_TABLET | ORAL | Status: AC
  Filled 2020-09-29: qty 2

## 2020-09-29 MED ORDER — SODIUM CHLORIDE 0.9 % IV SOLN
10.0000 mg | Freq: Once | INTRAVENOUS | Status: AC
  Administered 2020-09-29: 10 mg via INTRAVENOUS
  Filled 2020-09-29: qty 10

## 2020-09-29 MED ORDER — DIPHENHYDRAMINE HCL 25 MG PO CAPS
25.0000 mg | ORAL_CAPSULE | Freq: Once | ORAL | Status: AC
  Administered 2020-09-29: 25 mg via ORAL

## 2020-09-29 MED ORDER — ACETAMINOPHEN 325 MG PO TABS
650.0000 mg | ORAL_TABLET | Freq: Once | ORAL | Status: AC
  Administered 2020-09-29: 650 mg via ORAL

## 2020-09-29 MED ORDER — SODIUM CHLORIDE 0.9% FLUSH
10.0000 mL | Freq: Once | INTRAVENOUS | Status: DC
  Filled 2020-09-29: qty 10

## 2020-09-29 MED ORDER — SODIUM CHLORIDE 0.9 % IV SOLN
Freq: Once | INTRAVENOUS | Status: AC
  Filled 2020-09-29: qty 250

## 2020-09-29 NOTE — Assessment & Plan Note (Signed)
Her examination is benign Overall, she tolerated treatment well I will order CT imaging next month before cycle 4 therapy

## 2020-09-29 NOTE — Assessment & Plan Note (Signed)

## 2020-09-29 NOTE — Patient Instructions (Addendum)
[facility]charge Inst[facility] for Patients Receiving Chemotherapy  Today you received the following chemotherapy agents: Rituximab and Bendamustine (Bendeka)  To help prevent nausea and vomiting after your treatment, we encourage you to take your nausea medication as directed by your MD.   If you develop nausea and vomiting that is not controlled by your nausea medication, call the clinic.   BELOW ARE SYMPTOMS THAT SHOULD BE REPORTED IMMEDIATELY:  *FEVER GREATER THAN 100.5 F  *CHILLS WITH OR WITHOUT FEVER  NAUSEA AND VOMITING THAT IS NOT CONTROLLED WITH YOUR NAUSEA MEDICATION  *UNUSUAL SHORTNESS OF BREATH  *UNUSUAL BRUISING OR BLEEDING  TENDERNESS IN MOUTH AND THROAT WITH OR WITHOUT PRESENCE OF ULCERS  *URINARY PROBLEMS  *BOWEL PROBLEMS  UNUSUAL RASH Items with * indicate a potential emergency and should be followed up as soon as possible.  Feel free to call the clinic should you have any questions or concerns. The clinic phone number is [phone].  Please show the CHEMO ALERT CARD at check-in to the Emergency Department and triage nurse.   

## 2020-09-29 NOTE — Assessment & Plan Note (Signed)
She has chronic kidney disease stage III.  This is stable Continue medical management 

## 2020-09-29 NOTE — Progress Notes (Signed)
Carrie [name] OFFICE PROGRESS NOTE  Patient Care Team: Domenick Bookbinder, MD as PCP - General (Internal Medicine) [name], MD as PCP - Cardiology (Cardiology) Carola Frost, RN as Registered Nurse (Medical Oncology) [name], MD as Attending Physician (Internal Medicine)  ASSESSMENT & PLAN:  Grade 3a follicular lymphoma of lymph nodes of multiple regions [facility]) Her examination is benign Overall, Carrie [name] tolerated treatment well I will order CT imaging next month before cycle 4 therapy  Chronic kidney disease, stage III (moderate) ([place]) Carrie [name] has chronic kidney disease stage III.  This is stable Continue medical management  Anemia of chronic disease This is likely due to recent treatment. The patient denies recent history of bleeding such as epistaxis, hematuria or hematochezia. Carrie [name] is asymptomatic from the anemia. I will observe for now.  Carrie [name] does not require transfusion now. I will continue the chemotherapy at current dose without dosage adjustment.  If the anemia gets progressive worse in the future, I might have to delay her treatment or adjust the chemotherapy dose.    Orders Placed This Encounter  Procedures  . CT CHEST ABDOMEN PELVIS W CONTRAST    Standing Status:   Future    Standing Expiration Date:   09/29/2021    Order Specific Question:   Preferred imaging location?    Answer:   [facility]    Order Specific Question:   Radiology Contrast Protocol - do NOT remove file path    Answer:   [email or web address]    All questions were answered. The patient knows to call the clinic with any problems, questions or concerns. The total time spent in the appointment was 20 minutes encounter with patients including review of chart and various tests results, discussions about plan of care and coordination of care plan   [name], MD 09/29/2020 11:53 AM  INTERVAL HISTORY: Please see below for problem oriented  charting. Carrie [name] is seen prior to cycle 3 of chemotherapy Since last time I saw her, her rashes disappeared Carrie [name] denies other side effects or new symptoms No new lymphadenopathy No recent infection, fever or chills No recent exacerbation of congestive heart failure Carrie [name] fell last week and had minor bruising around her right eye and right knee but did not sustain major injury or bleeding.  Carrie [name] denies presyncopal episode.  Carrie [name] tripped over the curb after Carrie [name] visited the store  [place] HISTORY: Oncology History  Grade 3a follicular lymphoma of lymph nodes of multiple regions ([place])  01/21/2015 Surgery   Carrie [name] underwent excisional lymph node biopsy that came back follicular lymphoma grade 3   01/21/2015 Pathology Results   Accession: [id number] biopsy confirmed follicular lymphoma   11/29/9415 Imaging   Echocardiogram showed ejection fraction of 55-60%   02/11/2015 Imaging    PET CT scan show possible splenic involvement and diffuse lymphadenopathy throughout   02/11/2015 Bone Marrow Biopsy    bone marrow biopsy was performed and is involved by lymphoma with translocation of igH/BCL2   02/13/2015 Procedure   Carrie [name] had port placement.   02/17/2015 - 06/02/2015 Chemotherapy   Carrie [name] received R-CHOP chemo x 6   02/17/2015 Adverse Reaction   Carrie [name] had mild infusion reaction with cycle 1 of treatment.   04/18/2015 Imaging   PET CT scan showed near complete response to treatment.   04/22/2015 Adverse Reaction   Vincristine dose was reduced by 50% due to neuropathy from cycle 4 onwards   06/25/2015 - 07/06/2015 Hospital Admission   Carrie [name] was hospitalized  for recent sepsis/bacteremia and a fib with RVR   07/11/2015 Imaging   repeat PEt scan showed complete response to Rx   07/14/2015 - 06/16/2017 Chemotherapy   Carrie [name] received maintenance Rituximab every 60 days   12/15/2015 Imaging   PET CT showed no evidence of cancer recurrence   06/15/2016 PET scan   No evidence of active lymphoma on skullbase to  thigh FDG PET scan. Small LEFT periaortic retroperitoneal lymph nodes without significant metabolic activity ( [place] 1). No change from prior   03/24/2017 Imaging   1. Borderline enlarged abdominal retroperitoneal lymph nodes, stable. No new adenopathy in the chest, abdomen or pelvis. 2. Aortic atherosclerosis (ICD10-170.0). 3. Enlarged pulmonary arteries, indicative of pulmonary arterial hypertension.   09/14/2017 PET scan   Stable exam. No evidence of active lymphoma or other acute findings. (Deauville score 1)   08/08/2018 PET scan   1. Evidence of progressive/recurrent disease, as evidenced by new hypermetabolic nodes and subcutaneous nodule/nodes throughout left chest and abdomen. (Deauville 5). 2. Likely physiologic right nasopharyngeal hypermetabolism. Recommend attention on follow-up. 3. Incidental findings, including pulmonary artery enlargement, suggesting pulmonary arterial hypertension, stable left lower lobe pulmonary nodule, and aortic Atherosclerosis (ICD10-I70.0).   08/29/2018 Initial Biopsy   Soft tissue simple excision left back: Follicular lymphoma grade 1-2 positive for CD20, PAX5, bcl-6, and bcl-2.  Ki-67 30%   09/11/2018 - 02/20/2019 Chemotherapy   The patient had Bendamustine and Rituximab   12/13/2018 Imaging   1. Response to therapy, as evidenced by resolution of left chest wall nodularity and decrease in size of small left axillary nodes. 2. No residual soft tissue thickening at the site of hypermetabolism along the posterior left eleventh rib. Resolution of adjacent subcutaneous nodularity. 3. No new or progressive disease identified. 4.  Aortic Atherosclerosis (ICD10-I70.0). 5. Bilateral pulmonary nodules, similar. 6. Trace air within the urinary bladder could be iatrogenic. Possible pericystic edema. Correlate with symptoms of cystitis and recent instrumentation.   07/30/2019 Imaging   Ct chest, abdomen and pelvis No findings suspicious for active  lymphoma in the chest, abdomen, or pelvis.   Small bilateral pulmonary nodules, unchanged, benign.   Again noted is trace nondependent gas within the bladder. Correlate for recent intervention.   07/09/2020 Imaging   MRI imaging performed [facility] on the left shoulder reviewed significant lymphadenopathy, worrisome for recurrent lymphoma   07/23/2020 Imaging   1. Recurrent lymphoma as evidenced by enlarging left pectoral and abdominal retroperitoneal lymph nodes. Small left supraclavicular lymph nodes are new. 2. Avascular necrosis in the right femoral head with secondary degenerative osteoarthritis in the right hip. 3.  Aortic atherosclerosis (ICD10-I70.0). 4. Enlarged pulmonic trunk, indicative of pulmonary arterial hypertension.     08/04/2020 -  Chemotherapy   The patient had Bendamustine and Rituxan     REVIEW OF SYSTEMS:   Constitutional: Denies fevers, chills or abnormal weight loss Eyes: Denies blurriness of vision Ears, nose, mouth, throat, and face: Denies mucositis or sore throat Respiratory: Denies cough, dyspnea or wheezes Cardiovascular: Denies palpitation, chest discomfort or lower extremity swelling Gastrointestinal:  Denies nausea, heartburn or change in bowel habits Skin: Denies abnormal skin rashes Lymphatics: Denies new lymphadenopathy or easy bruising Neurological:Denies numbness, tingling or new weaknesses Behavioral/Psych: Mood is stable, no new changes  All other systems were reviewed with the patient and are negative.  I have reviewed the past medical history, past surgical history, social history and family history with the patient and they are unchanged from previous note.  ALLERGIES:  is allergic to amiodarone.  MEDICATIONS:  Current Outpatient Medications  Medication Sig Dispense Refill  . acetaminophen (TYLENOL) 500 MG tablet Take 500 mg by mouth every 6 (six) hours as needed for moderate pain or headache.    Marland Kitchen acyclovir (ZOVIRAX) 400 MG tablet  Take 1 tablet (400 mg total) by mouth daily. 30 tablet 3  . allopurinol (ZYLOPRIM) 300 MG tablet Take 1 tablet (300 mg total) by mouth daily. 30 tablet 3  . apixaban (ELIQUIS) 5 MG TABS tablet Take 5 mg by mouth 2 (two) times daily.     . bisacodyl (DULCOLAX) 5 MG EC tablet Take 5 mg by mouth daily as needed for moderate constipation.    . diclofenac sodium (VOLTAREN) 1 % GEL Apply 2 g topically 3 (three) times daily as needed (knee pain). 100 g 0  . levothyroxine (SYNTHROID, LEVOTHROID) 112 MCG tablet Take 112 mcg by mouth daily.   9  . lidocaine-prilocaine (EMLA) cream Apply to affected area once 30 g 3  . metoprolol succinate (TOPROL-XL) 25 MG 24 hr tablet Take 0.5 tablets (12.5 mg total) by mouth daily. Take with or immediately following a meal. 90 tablet 3  . ondansetron (ZOFRAN) 8 MG tablet Take 1 tablet (8 mg total) by mouth every 8 (eight) hours as needed for refractory nausea / vomiting. Start on day 2 after bendamustine chemo. 30 tablet 1  . polyvinyl alcohol (LIQUIFILM TEARS) 1.4 % ophthalmic solution Place 1 drop into both eyes as needed for dry eyes.    Marland Kitchen prochlorperazine (COMPAZINE) 10 MG tablet Take 1 tablet (10 mg total) by mouth every 6 (six) hours as needed (Nausea or vomiting). 30 tablet 1   No current facility-administered medications for this visit.   Facility-Administered Medications Ordered in Other Visits  Medication Dose Route Frequency Provider Last Rate Last Admin  . ondansetron (ZOFRAN) 8 mg in sodium chloride 0.9 % 50 mL IVPB   Intravenous Once Katerin Negrete, MD      . sodium chloride 0.9 % injection 10 mL  10 mL Intravenous PRN Alvy Bimler, Kylyn Mcdade, MD   10 mL at 04/05/17 1315  . sodium chloride flush (NS) 0.9 % injection 10 mL  10 mL Intracatheter Once [name], MD        PHYSICAL EXAMINATION: ECOG PERFORMANCE STATUS: 1 - Symptomatic but completely ambulatory  Vitals:   09/29/20 1148  BP: 126/68  Pulse: 70  Resp: 17  Temp: (!) 97.2 F (36.2 C)  SpO2: 100%    Filed Weights   09/29/20 1148  Weight: 139 lb 9.6 oz (63.3 kg)    GENERAL:alert, no distress and comfortable SKIN: Noted skin bruising around her eye and her right knee EYES: normal, Conjunctiva are pink and non-injected, sclera clear OROPHARYNX:no exudate, no erythema and lips, buccal mucosa, and tongue normal  NECK: supple, thyroid normal size, non-tender, without nodularity LYMPH:  no palpable lymphadenopathy in the cervical, axillary or inguinal LUNGS: clear to auscultation and percussion with normal breathing effort HEART: regular rate & rhythm and no murmurs and no lower extremity edema ABDOMEN:abdomen soft, non-tender and normal bowel sounds Musculoskeletal:no cyanosis of digits and no clubbing  NEURO: alert & oriented x 3 with fluent speech, no focal motor/sensory deficits  LABORATORY DATA:  I have reviewed the data as listed    Component Value Date/Time   NA 142 09/01/2020 1102   NA 143 07/16/2019 1132   NA 142 08/29/2017 0838   K 4.2 09/01/2020 1102   K 4.2 08/29/2017 9326  CL 109 09/01/2020 1102   CO2 25 09/01/2020 1102   CO2 23 08/29/2017 0838   GLUCOSE 98 09/01/2020 1102   GLUCOSE 97 08/29/2017 0838   BUN 29 (H) 09/01/2020 1102   BUN 27 07/16/2019 1132   BUN 24.0 08/29/2017 0838   CREATININE 1.15 (H) 09/01/2020 1102   CREATININE 1.0 08/29/2017 0838   CALCIUM 9.0 09/01/2020 1102   CALCIUM 8.9 08/29/2017 0838   PROT 6.2 (L) 09/01/2020 1102   PROT 6.4 08/29/2017 0838   ALBUMIN 3.8 09/01/2020 1102   ALBUMIN 3.8 08/29/2017 0838   AST 18 09/01/2020 1102   AST 18 08/29/2017 0838   ALT 14 09/01/2020 1102   ALT 16 08/29/2017 0838   ALKPHOS 79 09/01/2020 1102   ALKPHOS 93 08/29/2017 0838   BILITOT 0.7 09/01/2020 1102   BILITOT 0.33 08/29/2017 0838   GFRNONAA 49 (L) 09/01/2020 1102   GFRAA 58 (L) 05/26/2020 1103   GFRAA 39 (L) 07/30/2019 1010    No results found for: SPEP, UPEP  Lab Results  Component Value Date   WBC 5.0 09/29/2020   NEUTROABS 4.0  09/29/2020   HGB 11.8 (L) 09/29/2020   HCT 35.2 (L) 09/29/2020   MCV 95.7 09/29/2020   PLT 169 09/29/2020      Chemistry      Component Value Date/Time   NA 142 09/01/2020 1102   NA 143 07/16/2019 1132   NA 142 08/29/2017 0838   K 4.2 09/01/2020 1102   K 4.2 08/29/2017 0838   CL 109 09/01/2020 1102   CO2 25 09/01/2020 1102   CO2 23 08/29/2017 0838   BUN 29 (H) 09/01/2020 1102   BUN 27 07/16/2019 1132   BUN 24.0 08/29/2017 0838   CREATININE 1.15 (H) 09/01/2020 1102   CREATININE 1.0 08/29/2017 0838      Component Value Date/Time   CALCIUM 9.0 09/01/2020 1102   CALCIUM 8.9 08/29/2017 0838   ALKPHOS 79 09/01/2020 1102   ALKPHOS 93 08/29/2017 0838   AST 18 09/01/2020 1102   AST 18 08/29/2017 0838   ALT 14 09/01/2020 1102   ALT 16 08/29/2017 0838   BILITOT 0.7 09/01/2020 1102   BILITOT 0.33 08/29/2017 5003

## 2020-09-30 ENCOUNTER — Inpatient Hospital Stay: Payer: No Typology Code available for payment source

## 2020-09-30 ENCOUNTER — Other Ambulatory Visit: Payer: Self-pay

## 2020-09-30 VITALS — BP 117/73 | HR 73 | Temp 97.3°F | Resp 18

## 2020-09-30 DIAGNOSIS — Z5111 Encounter for antineoplastic chemotherapy: Secondary | ICD-10-CM | POA: Diagnosis not present

## 2020-09-30 DIAGNOSIS — C8238 Follicular lymphoma grade IIIa, lymph nodes of multiple sites: Secondary | ICD-10-CM

## 2020-09-30 DIAGNOSIS — Z7189 Other specified counseling: Secondary | ICD-10-CM

## 2020-09-30 MED ORDER — SODIUM CHLORIDE 0.9% FLUSH
10.0000 mL | INTRAVENOUS | Status: DC | PRN
  Administered 2020-09-30: 10 mL
  Filled 2020-09-30: qty 10

## 2020-09-30 MED ORDER — SODIUM CHLORIDE 0.9 % IV SOLN
60.0000 mg/m2 | Freq: Once | INTRAVENOUS | Status: AC
  Administered 2020-09-30: 100 mg via INTRAVENOUS
  Filled 2020-09-30: qty 4

## 2020-09-30 MED ORDER — SODIUM CHLORIDE 0.9 % IV SOLN
Freq: Once | INTRAVENOUS | Status: AC
  Filled 2020-09-30: qty 250

## 2020-09-30 MED ORDER — HEPARIN SOD (PORK) LOCK FLUSH 100 UNIT/ML IV SOLN
500.0000 [IU] | Freq: Once | INTRAVENOUS | Status: AC | PRN
  Administered 2020-09-30: 500 [IU]
  Filled 2020-09-30: qty 5

## 2020-09-30 MED ORDER — SODIUM CHLORIDE 0.9 % IV SOLN
10.0000 mg | Freq: Once | INTRAVENOUS | Status: AC
  Administered 2020-09-30: 10 mg via INTRAVENOUS
  Filled 2020-09-30: qty 10

## 2020-09-30 NOTE — Patient Instructions (Signed)
[facility] Discharge Instructions for Patients Receiving Chemotherapy  Today you received the following chemotherapy agents [place].   To help prevent nausea and vomiting after your treatment, we encourage you to take your nausea medication as prescribed.    If you develop nausea and vomiting that is not controlled by your nausea medication, call the clinic.   BELOW ARE SYMPTOMS THAT SHOULD BE REPORTED IMMEDIATELY:  *FEVER GREATER THAN 100.5 F  *CHILLS WITH OR WITHOUT FEVER  NAUSEA AND VOMITING THAT IS NOT CONTROLLED WITH YOUR NAUSEA MEDICATION  *UNUSUAL SHORTNESS OF BREATH  *UNUSUAL BRUISING OR BLEEDING  TENDERNESS IN MOUTH AND THROAT WITH OR WITHOUT PRESENCE OF ULCERS  *URINARY PROBLEMS  *BOWEL PROBLEMS  UNUSUAL RASH Items with * indicate a potential emergency and should be followed up as soon as possible.  Feel free to call the clinic should you have any questions or concerns. The clinic phone number is [phone].  Please show the CHEMO ALERT CARD at check-in to the Emergency Department and triage nurse.  

## 2020-10-24 ENCOUNTER — Other Ambulatory Visit: Payer: Self-pay

## 2020-10-24 ENCOUNTER — Encounter (HOSPITAL_COMMUNITY): Payer: Self-pay

## 2020-10-24 ENCOUNTER — Ambulatory Visit (HOSPITAL_COMMUNITY)
Admission: RE | Admit: 2020-10-24 | Discharge: 2020-10-24 | Disposition: A | Discharge status: Home / Self Care | Payer: No Typology Code available for payment source | Source: Ambulatory Visit | Attending: Hematology and Oncology | Admitting: Hematology and Oncology

## 2020-10-24 DIAGNOSIS — C8238 Follicular lymphoma grade IIIa, lymph nodes of multiple sites: Secondary | ICD-10-CM | POA: Diagnosis present

## 2020-10-24 MED ORDER — HEPARIN SOD (PORK) LOCK FLUSH 100 UNIT/ML IV SOLN
INTRAVENOUS | Status: AC
  Filled 2020-10-24: qty 5

## 2020-10-24 MED ORDER — HEPARIN SOD (PORK) LOCK FLUSH 100 UNIT/ML IV SOLN
500.0000 [IU] | Freq: Once | INTRAVENOUS | Status: AC
  Administered 2020-10-24: 500 [IU] via INTRAVENOUS

## 2020-10-24 MED ORDER — IOHEXOL 300 MG/ML  SOLN
100.0000 mL | Freq: Once | INTRAMUSCULAR | Status: AC | PRN
  Administered 2020-10-24: 100 mL via INTRAVENOUS

## 2020-10-27 ENCOUNTER — Inpatient Hospital Stay (HOSPITAL_BASED_OUTPATIENT_CLINIC_OR_DEPARTMENT_OTHER): Payer: Medicare HMO | Admitting: Hematology and Oncology

## 2020-10-27 ENCOUNTER — Inpatient Hospital Stay: Payer: Medicare HMO | Attending: Hematology and Oncology

## 2020-10-27 ENCOUNTER — Other Ambulatory Visit: Payer: Self-pay

## 2020-10-27 ENCOUNTER — Inpatient Hospital Stay: Payer: Medicare HMO

## 2020-10-27 ENCOUNTER — Encounter: Payer: Self-pay | Admitting: Hematology and Oncology

## 2020-10-27 VITALS — BP 136/82 | HR 69 | Temp 98.1°F | Resp 18

## 2020-10-27 DIAGNOSIS — I482 Chronic atrial fibrillation, unspecified: Secondary | ICD-10-CM | POA: Insufficient documentation

## 2020-10-27 DIAGNOSIS — Z5111 Encounter for antineoplastic chemotherapy: Secondary | ICD-10-CM | POA: Insufficient documentation

## 2020-10-27 DIAGNOSIS — Z5112 Encounter for antineoplastic immunotherapy: Secondary | ICD-10-CM | POA: Diagnosis present

## 2020-10-27 DIAGNOSIS — I272 Pulmonary hypertension, unspecified: Secondary | ICD-10-CM | POA: Insufficient documentation

## 2020-10-27 DIAGNOSIS — N183 Chronic kidney disease, stage 3 unspecified: Secondary | ICD-10-CM | POA: Insufficient documentation

## 2020-10-27 DIAGNOSIS — Z79899 Other long term (current) drug therapy: Secondary | ICD-10-CM | POA: Diagnosis not present

## 2020-10-27 DIAGNOSIS — C8238 Follicular lymphoma grade IIIa, lymph nodes of multiple sites: Secondary | ICD-10-CM

## 2020-10-27 DIAGNOSIS — Z7189 Other specified counseling: Secondary | ICD-10-CM

## 2020-10-27 LAB — CBC WITH DIFFERENTIAL (CANCER CENTER ONLY)
Abs Immature Granulocytes: 0.02 10*3/uL (ref 0.00–0.07)
Basophils Absolute: 0 10*3/uL (ref 0.0–0.1)
Basophils Relative: 1 %
Eosinophils Absolute: 0.2 10*3/uL (ref 0.0–0.5)
Eosinophils Relative: 4 %
HCT: 37.7 % (ref 36.0–46.0)
Hemoglobin: 12.5 g/dL (ref 12.0–15.0)
Immature Granulocytes: 0 %
Lymphocytes Relative: 7 %
Lymphs Abs: 0.3 10*3/uL — ABNORMAL LOW (ref 0.7–4.0)
MCH: 32.6 pg (ref 26.0–34.0)
MCHC: 33.2 g/dL (ref 30.0–36.0)
MCV: 98.4 fL (ref 80.0–100.0)
Monocytes Absolute: 0.7 10*3/uL (ref 0.1–1.0)
Monocytes Relative: 14 %
Neutro Abs: 3.3 10*3/uL (ref 1.7–7.7)
Neutrophils Relative %: 74 %
Platelet Count: 160 10*3/uL (ref 150–400)
RBC: 3.83 MIL/uL — ABNORMAL LOW (ref 3.87–5.11)
RDW: 14.6 % (ref 11.5–15.5)
WBC Count: 4.6 10*3/uL (ref 4.0–10.5)
nRBC: 0 % (ref 0.0–0.2)

## 2020-10-27 LAB — CMP (CANCER CENTER ONLY)
ALT: 11 U/L (ref 0–44)
AST: 15 U/L (ref 15–41)
Albumin: 3.9 g/dL (ref 3.5–5.0)
Alkaline Phosphatase: 68 U/L (ref 38–126)
Anion gap: 9 (ref 5–15)
BUN: 27 mg/dL — ABNORMAL HIGH (ref 8–23)
CO2: 25 mmol/L (ref 22–32)
Calcium: 9.3 mg/dL (ref 8.9–10.3)
Chloride: 107 mmol/L (ref 98–111)
Creatinine: 1.09 mg/dL — ABNORMAL HIGH (ref 0.44–1.00)
GFR, Estimated: 52 mL/min — ABNORMAL LOW (ref >60)
Glucose, Bld: 80 mg/dL (ref 70–99)
Potassium: 4.5 mmol/L (ref 3.5–5.1)
Sodium: 141 mmol/L (ref 135–145)
Total Bilirubin: 0.6 mg/dL (ref 0.3–1.2)
Total Protein: 6.4 g/dL — ABNORMAL LOW (ref 6.5–8.1)

## 2020-10-27 LAB — URIC ACID: Uric Acid, Serum: 3.3 mg/dL (ref 2.5–7.1)

## 2020-10-27 MED ORDER — SODIUM CHLORIDE 0.9 % IV SOLN
60.0000 mg/m2 | Freq: Once | INTRAVENOUS | Status: AC
  Administered 2020-10-27: 100 mg via INTRAVENOUS
  Filled 2020-10-27: qty 4

## 2020-10-27 MED ORDER — ACETAMINOPHEN 325 MG PO TABS
ORAL_TABLET | ORAL | Status: AC
  Filled 2020-10-27: qty 2

## 2020-10-27 MED ORDER — PALONOSETRON HCL INJECTION 0.25 MG/5ML
INTRAVENOUS | Status: AC
  Filled 2020-10-27: qty 5

## 2020-10-27 MED ORDER — SODIUM CHLORIDE 0.9% FLUSH
10.0000 mL | Freq: Once | INTRAVENOUS | Status: DC
  Filled 2020-10-27: qty 10

## 2020-10-27 MED ORDER — RITUXIMAB-PVVR CHEMO 500 MG/50ML IV SOLN
375.0000 mg/m2 | Freq: Once | INTRAVENOUS | Status: AC
  Administered 2020-10-27: 600 mg via INTRAVENOUS
  Filled 2020-10-27: qty 50

## 2020-10-27 MED ORDER — ACETAMINOPHEN 325 MG PO TABS
650.0000 mg | ORAL_TABLET | Freq: Once | ORAL | Status: AC
  Administered 2020-10-27: 650 mg via ORAL

## 2020-10-27 MED ORDER — SODIUM CHLORIDE 0.9 % IV SOLN
10.0000 mg | Freq: Once | INTRAVENOUS | Status: AC
  Administered 2020-10-27: 10 mg via INTRAVENOUS
  Filled 2020-10-27: qty 10

## 2020-10-27 MED ORDER — DIPHENHYDRAMINE HCL 25 MG PO CAPS
ORAL_CAPSULE | ORAL | Status: AC
  Filled 2020-10-27: qty 1

## 2020-10-27 MED ORDER — SODIUM CHLORIDE 0.9 % IV SOLN
Freq: Once | INTRAVENOUS | Status: AC
  Filled 2020-10-27: qty 250

## 2020-10-27 MED ORDER — HEPARIN SOD (PORK) LOCK FLUSH 100 UNIT/ML IV SOLN
500.0000 [IU] | Freq: Once | INTRAVENOUS | Status: AC | PRN
  Administered 2020-10-27: 500 [IU]
  Filled 2020-10-27: qty 5

## 2020-10-27 MED ORDER — DIPHENHYDRAMINE HCL 25 MG PO CAPS
25.0000 mg | ORAL_CAPSULE | Freq: Once | ORAL | Status: AC
  Administered 2020-10-27: 25 mg via ORAL

## 2020-10-27 MED ORDER — PALONOSETRON HCL INJECTION 0.25 MG/5ML
0.2500 mg | Freq: Once | INTRAVENOUS | Status: AC
  Administered 2020-10-27: 0.25 mg via INTRAVENOUS

## 2020-10-27 MED ORDER — SODIUM CHLORIDE 0.9% FLUSH
10.0000 mL | INTRAVENOUS | Status: DC | PRN
  Administered 2020-10-27: 10 mL
  Filled 2020-10-27: qty 10

## 2020-10-27 MED ORDER — SODIUM CHLORIDE 0.9% FLUSH
10.0000 mL | Freq: Once | INTRAVENOUS | Status: AC
  Administered 2020-10-27: 10 mL
  Filled 2020-10-27: qty 10

## 2020-10-27 NOTE — Patient Instructions (Signed)
[facility] Discharge Instructions for Patients Receiving Chemotherapy  Today you received the following chemotherapy agents: rituximab/bendamustine  To help prevent nausea and vomiting after your treatment, we encourage you to take your nausea medication as directed.   If you develop nausea and vomiting that is not controlled by your nausea medication, call the clinic.   BELOW ARE SYMPTOMS THAT SHOULD BE REPORTED IMMEDIATELY:  *FEVER GREATER THAN 100.5 F  *CHILLS WITH OR WITHOUT FEVER  NAUSEA AND VOMITING THAT IS NOT CONTROLLED WITH YOUR NAUSEA MEDICATION  *UNUSUAL SHORTNESS OF BREATH  *UNUSUAL BRUISING OR BLEEDING  TENDERNESS IN MOUTH AND THROAT WITH OR WITHOUT PRESENCE OF ULCERS  *URINARY PROBLEMS  *BOWEL PROBLEMS  UNUSUAL RASH Items with * indicate a potential emergency and should be followed up as soon as possible.  Feel free to call the clinic should you have any questions or concerns. The clinic phone number is [phone].  Please show the CHEMO ALERT CARD at check-in to the Emergency Department and triage nurse.   

## 2020-10-27 NOTE — Assessment & Plan Note (Signed)
She has chronic kidney disease stage III.  This is stable Continue medical management 

## 2020-10-27 NOTE — Progress Notes (Signed)
[place] OFFICE PROGRESS NOTE  Patient Care Team: Domenick Bookbinder, MD as PCP - General (Internal Medicine) [name], MD as PCP - Cardiology (Cardiology) [name], MD as Attending Physician (Internal Medicine)  ASSESSMENT & PLAN:  Grade 3a follicular lymphoma of lymph nodes of multiple regions [facility]) I have reviewed CT imaging with the patient She has achieved complete response with 3 cycles of Bendamustine and rituximab I plan to continue on reduced dose Bendamustine which she tolerated very well My plan would be to complete 6 cycles before switching her over to maintenance rituximab  Pulmonary hypertension ([place]) She has chronic shortness of breath with minimal exertion Her oxygen saturation fluctuate widely, overall consistent with no signs of hypoxia CT imaging shows signs of pulmonary hypertension I told the patient this is not related to her treatment I will reach out to her cardiologist to see if she needs further work-up  Chronic kidney disease, stage III (moderate) ([place]) She has chronic kidney disease stage III.  This is stable Continue medical management  Chronic atrial fibrillation She has chronic atrial fibrillation On examination today, she was noted to have variable heart rate although by examination, I felt that she is in sinus rhythm She will continue medical management She has no signs or symptoms of congestive heart failure today   No orders of the defined types were placed in this encounter.   All questions were answered. The patient knows to call the clinic with any problems, questions or concerns. The total time spent in the appointment was 30 minutes encounter with patients including review of chart and various tests results, discussions about plan of care and coordination of care plan   [name], MD 10/27/2020 12:35 PM  INTERVAL HISTORY: Please see below for problem oriented charting. She returns for further  follow-up She complained of shortness of breath When her vital signs were checked, she was noted to have occasional low oxygen saturation On more prolonged monitoring, her oxygen saturation approach 100% Her heart rate is also noted to be variable but overall, her regular average heart rate is around 70 bpm She has no new lymphadenopathy No recent infection, fever or chills  SUMMARY OF ONCOLOGIC HISTORY: Oncology History  Grade 3a follicular lymphoma of lymph nodes of multiple regions ([place])  01/21/2015 Surgery   She underwent excisional lymph node biopsy that came back follicular lymphoma grade 3   01/21/2015 Pathology Results   Accession: [id number] biopsy confirmed follicular lymphoma   12/23/6576 Imaging   Echocardiogram showed ejection fraction of 55-60%   02/11/2015 Imaging    PET CT scan show possible splenic involvement and diffuse lymphadenopathy throughout   02/11/2015 Bone Marrow Biopsy    bone marrow biopsy was performed and is involved by lymphoma with translocation of igH/BCL2   02/13/2015 Procedure   She had port placement.   02/17/2015 - 06/02/2015 Chemotherapy   She received R-CHOP chemo x 6   02/17/2015 Adverse Reaction   She had mild infusion reaction with cycle 1 of treatment.   04/18/2015 Imaging   PET CT scan showed near complete response to treatment.   04/22/2015 Adverse Reaction   Vincristine dose was reduced by 50% due to neuropathy from cycle 4 onwards   06/25/2015 - 07/06/2015 Hospital Admission   She was hospitalized for recent sepsis/bacteremia and a fib with RVR   07/11/2015 Imaging   repeat PEt scan showed complete response to Rx   07/14/2015 - 06/16/2017 Chemotherapy   She received maintenance Rituximab  every 60 days   12/15/2015 Imaging   PET CT showed no evidence of cancer recurrence   06/15/2016 PET scan   No evidence of active lymphoma on skullbase to thigh FDG PET scan. Small LEFT periaortic retroperitoneal lymph nodes without significant metabolic  activity ( [place] 1). No change from prior   03/24/2017 Imaging   1. Borderline enlarged abdominal retroperitoneal lymph nodes, stable. No new adenopathy in the chest, abdomen or pelvis. 2. Aortic atherosclerosis (ICD10-170.0). 3. Enlarged pulmonary arteries, indicative of pulmonary arterial hypertension.   09/14/2017 PET scan   Stable exam. No evidence of active lymphoma or other acute findings. (Deauville score 1)   08/08/2018 PET scan   1. Evidence of progressive/recurrent disease, as evidenced by new hypermetabolic nodes and subcutaneous nodule/nodes throughout left chest and abdomen. (Deauville 5). 2. Likely physiologic right nasopharyngeal hypermetabolism. Recommend attention on follow-up. 3. Incidental findings, including pulmonary artery enlargement, suggesting pulmonary arterial hypertension, stable left lower lobe pulmonary nodule, and aortic Atherosclerosis (ICD10-I70.0).   08/29/2018 Initial Biopsy   Soft tissue simple excision left back: Follicular lymphoma grade 1-2 positive for CD20, PAX5, bcl-6, and bcl-2.  Ki-67 30%   09/11/2018 - 02/20/2019 Chemotherapy   The patient had Bendamustine and Rituximab   12/13/2018 Imaging   1. Response to therapy, as evidenced by resolution of left chest wall nodularity and decrease in size of small left axillary nodes. 2. No residual soft tissue thickening at the site of hypermetabolism along the posterior left eleventh rib. Resolution of adjacent subcutaneous nodularity. 3. No new or progressive disease identified. 4.  Aortic Atherosclerosis (ICD10-I70.0). 5. Bilateral pulmonary nodules, similar. 6. Trace air within the urinary bladder could be iatrogenic. Possible pericystic edema. Correlate with symptoms of cystitis and recent instrumentation.   07/30/2019 Imaging   Ct chest, abdomen and pelvis No findings suspicious for active lymphoma in the chest, abdomen, or pelvis.   Small bilateral pulmonary nodules, unchanged, benign.    Again noted is trace nondependent gas within the bladder. Correlate for recent intervention.   07/09/2020 Imaging   MRI imaging performed at [facility] on the left shoulder reviewed significant lymphadenopathy, worrisome for recurrent lymphoma   07/23/2020 Imaging   1. Recurrent lymphoma as evidenced by enlarging left pectoral and abdominal retroperitoneal lymph nodes. Small left supraclavicular lymph nodes are new. 2. Avascular necrosis in the right femoral head with secondary degenerative osteoarthritis in the right hip. 3.  Aortic atherosclerosis (ICD10-I70.0). 4. Enlarged pulmonic trunk, indicative of pulmonary arterial hypertension.     08/04/2020 -  Chemotherapy   The patient had Bendamustine and Rituxan   10/24/2020 Imaging   1. Resolution of left supraclavicular and left axillary adenopathy. No residual measurable disease suggesting an excellent response to treatment. 2. Significant interval improvement in retroperitoneal lymph nodes. No new or progressive findings. 3. Stable mild emphysematous changes. 4. Enlarged pulmonary arteries suggesting pulmonary hypertension. 5. Stable small low-attenuation splenic lesion, likely benign cyst. 6. Emphysema and aortic atherosclerosis.     REVIEW OF SYSTEMS:   Constitutional: Denies fevers, chills or abnormal weight loss Eyes: Denies blurriness of vision Ears, nose, mouth, throat, and face: Denies mucositis or sore throat Cardiovascular: Denies palpitation, chest discomfort or lower extremity swelling Gastrointestinal:  Denies nausea, heartburn or change in bowel habits Skin: Denies abnormal skin rashes Lymphatics: Denies new lymphadenopathy or easy bruising Neurological:Denies numbness, tingling or new weaknesses Behavioral/Psych: Mood is stable, no new changes  All other systems were reviewed with the patient and are negative.  I have reviewed the  past medical history, past surgical history, social history and family history with the  patient and they are unchanged from previous note.  ALLERGIES:  is allergic to amiodarone.  MEDICATIONS:  Current Outpatient Medications  Medication Sig Dispense Refill  . acetaminophen (TYLENOL) 500 MG tablet Take 500 mg by mouth every 6 (six) hours as needed for moderate pain or headache.    Marland Kitchen acyclovir (ZOVIRAX) 400 MG tablet Take 1 tablet (400 mg total) by mouth daily. 30 tablet 3  . allopurinol (ZYLOPRIM) 300 MG tablet Take 1 tablet (300 mg total) by mouth daily. 30 tablet 3  . apixaban (ELIQUIS) 5 MG TABS tablet Take 5 mg by mouth 2 (two) times daily.     . bisacodyl (DULCOLAX) 5 MG EC tablet Take 5 mg by mouth daily as needed for moderate constipation.    . diclofenac sodium (VOLTAREN) 1 % GEL Apply 2 g topically 3 (three) times daily as needed (knee pain). 100 g 0  . levothyroxine (SYNTHROID, LEVOTHROID) 112 MCG tablet Take 112 mcg by mouth daily.   9  . lidocaine-prilocaine (EMLA) cream Apply to affected area once 30 g 3  . metoprolol succinate (TOPROL-XL) 25 MG 24 hr tablet Take 0.5 tablets (12.5 mg total) by mouth daily. Take with or immediately following a meal. 90 tablet 3  . ondansetron (ZOFRAN) 8 MG tablet Take 1 tablet (8 mg total) by mouth every 8 (eight) hours as needed for refractory nausea / vomiting. Start on day 2 after bendamustine chemo. 30 tablet 1  . polyvinyl alcohol (LIQUIFILM TEARS) 1.4 % ophthalmic solution Place 1 drop into both eyes as needed for dry eyes.    Marland Kitchen prochlorperazine (COMPAZINE) 10 MG tablet Take 1 tablet (10 mg total) by mouth every 6 (six) hours as needed (Nausea or vomiting). 30 tablet 1   No current facility-administered medications for this visit.   Facility-Administered Medications Ordered in Other Visits  Medication Dose Route Frequency Provider Last Rate Last Admin  . bendamustine (BENDEKA) 100 mg in sodium chloride 0.9 % 50 mL (1.8519 mg/mL) chemo infusion  60 mg/m2 (Treatment Plan Recorded) Intravenous Once Alvy Bimler, Lalaine Overstreet, MD      . heparin  lock flush 100 unit/mL  500 Units Intracatheter Once PRN Alvy Bimler, Maren Wiesen, MD      . ondansetron (ZOFRAN) 8 mg in sodium chloride 0.9 % 50 mL IVPB   Intravenous Once Alvy Bimler, Elman Dettman, MD      . riTUXimab-pvvr (RUXIENCE) 600 mg in sodium chloride 0.9 % 190 mL infusion  375 mg/m2 (Treatment Plan Recorded) Intravenous Once Magdelena Kinsella, MD      . sodium chloride 0.9 % injection 10 mL  10 mL Intravenous PRN Alvy Bimler, Linnaea Ahn, MD   10 mL at 04/05/17 1315  . sodium chloride flush (NS) 0.9 % injection 10 mL  10 mL Intracatheter Once Alvy Bimler, Chou Busler, MD      . sodium chloride flush (NS) 0.9 % injection 10 mL  10 mL Intracatheter PRN Alvy Bimler, Traniya Prichett, MD        PHYSICAL EXAMINATION: ECOG PERFORMANCE STATUS: 1 - Symptomatic but completely ambulatory  Vitals:   10/27/20 1134  BP: (!) 144/90  Pulse: 70  Resp: 18  Temp: 97.8 F (36.6 C)  SpO2: 100%   Filed Weights   10/27/20 1134  Weight: 139 lb 12.8 oz (63.4 kg)    GENERAL:alert, no distress and comfortable SKIN: skin color, texture, turgor are normal, no rashes or significant lesions EYES: normal, Conjunctiva are pink and non-injected, sclera clear  OROPHARYNX:no exudate, no erythema and lips, buccal mucosa, and tongue normal  NECK: supple, thyroid normal size, non-tender, without nodularity LYMPH:  no palpable lymphadenopathy in the cervical, axillary or inguinal LUNGS: clear to auscultation and percussion with normal breathing effort HEART: regular rate & rhythm and no murmurs and no lower extremity edema ABDOMEN:abdomen soft, non-tender and normal bowel sounds Musculoskeletal:no cyanosis of digits and no clubbing  NEURO: alert & oriented x 3 with fluent speech, no focal motor/sensory deficits  LABORATORY DATA:  I have reviewed the data as listed    Component Value Date/Time   NA 141 10/27/2020 1120   NA 143 07/16/2019 1132   NA 142 08/29/2017 0838   K 4.5 10/27/2020 1120   K 4.2 08/29/2017 0838   CL 107 10/27/2020 1120   CO2 25 10/27/2020 1120   CO2 23  08/29/2017 0838   GLUCOSE 80 10/27/2020 1120   GLUCOSE 97 08/29/2017 0838   BUN 27 (H) 10/27/2020 1120   BUN 27 07/16/2019 1132   BUN 24.0 08/29/2017 0838   CREATININE 1.09 (H) 10/27/2020 1120   CREATININE 1.0 08/29/2017 0838   CALCIUM 9.3 10/27/2020 1120   CALCIUM 8.9 08/29/2017 0838   PROT 6.4 (L) 10/27/2020 1120   PROT 6.4 08/29/2017 0838   ALBUMIN 3.9 10/27/2020 1120   ALBUMIN 3.8 08/29/2017 0838   AST 15 10/27/2020 1120   AST 18 08/29/2017 0838   ALT 11 10/27/2020 1120   ALT 16 08/29/2017 0838   ALKPHOS 68 10/27/2020 1120   ALKPHOS 93 08/29/2017 0838   BILITOT 0.6 10/27/2020 1120   BILITOT 0.33 08/29/2017 0838   GFRNONAA 52 (L) 10/27/2020 1120   GFRAA 58 (L) 05/26/2020 1103   GFRAA 39 (L) 07/30/2019 1010    No results found for: SPEP, UPEP  Lab Results  Component Value Date   WBC 4.6 10/27/2020   NEUTROABS 3.3 10/27/2020   HGB 12.5 10/27/2020   HCT 37.7 10/27/2020   MCV 98.4 10/27/2020   PLT 160 10/27/2020      Chemistry      Component Value Date/Time   NA 141 10/27/2020 1120   NA 143 07/16/2019 1132   NA 142 08/29/2017 0838   K 4.5 10/27/2020 1120   K 4.2 08/29/2017 0838   CL 107 10/27/2020 1120   CO2 25 10/27/2020 1120   CO2 23 08/29/2017 0838   BUN 27 (H) 10/27/2020 1120   BUN 27 07/16/2019 1132   BUN 24.0 08/29/2017 0838   CREATININE 1.09 (H) 10/27/2020 1120   CREATININE 1.0 08/29/2017 0838      Component Value Date/Time   CALCIUM 9.3 10/27/2020 1120   CALCIUM 8.9 08/29/2017 0838   ALKPHOS 68 10/27/2020 1120   ALKPHOS 93 08/29/2017 0838   AST 15 10/27/2020 1120   AST 18 08/29/2017 0838   ALT 11 10/27/2020 1120   ALT 16 08/29/2017 0838   BILITOT 0.6 10/27/2020 1120   BILITOT 0.33 08/29/2017 0838       RADIOGRAPHIC STUDIES: I have reviewed CT imaging with the patient I have personally reviewed the radiological images as listed and agreed with the findings in the report. CT CHEST ABDOMEN PELVIS W CONTRAST  Result Date:  10/24/2020 CLINICAL DATA:  Restaging grade 3A follicular lymphoma. EXAM: CT CHEST, ABDOMEN, AND PELVIS WITH CONTRAST TECHNIQUE: Multidetector CT imaging of the chest, abdomen and pelvis was performed following the standard protocol during bolus administration of intravenous contrast. CONTRAST:  150m OMNIPAQUE IOHEXOL 300 MG/ML  SOLN COMPARISON:  07/23/2020  FINDINGS: CT CHEST FINDINGS Cardiovascular: The heart is mildly enlarged but stable. No pericardial effusion. The aorta and branch vessels are patent. Stable atherosclerotic calcifications. No dissection. No definite coronary artery calcifications. Enlarged pulmonary arterial trunk and right and left main pulmonary artery suggesting pulmonary hypertension. Mediastinum/Nodes: The left supraclavicular and left axillary adenopathy has resolved. Do not see any residual measurable disease suggesting an excellent response to treatment. No enlarged mediastinal or hilar lymph nodes. The esophagus is grossly normal. Lungs/Pleura: Stable mild emphysematous changes. No worrisome pulmonary lesions or pleural effusions or pleural nodules. Musculoskeletal: No breast masses or bone lesions. CT ABDOMEN PELVIS FINDINGS Hepatobiliary: Stable tiny left hepatic lobe lesion, likely benign cyst. No worrisome hepatic lesions or intrahepatic biliary dilatation. The gallbladder is normal. Normal caliber common bile duct. Pancreas: No mass, inflammation or ductal dilatation. Spleen: Small low-attenuation lesions spleen is stable and likely a benign cyst. No splenic enlargement or worrisome splenic lesions. Adrenals/Urinary Tract: Adrenal glands and kidneys are stable. Marked cortical irregularity of both kidneys could be due to fetal lobulation or cortical scarring changes related to previous infection. The bladder is unremarkable. Stomach/Bowel: The stomach, duodenum, small bowel and colon are unremarkable. No acute inflammatory changes, mass lesions or obstructive findings.  Vascular/Lymphatic: The aorta and branch vessels are patent. Stable scattered atherosclerotic calcifications. The major venous structures are patent. Significant interval improvement in retroperitoneal lymph nodes. Left para-aortic node on image number 64/2 measures 7 mm and previously measured 11 mm. Lower left para-aortic lymph node on image number 71/2 measures 5.5 mm and previously measured 9.5 mm. Left common iliac lymph node on image number 78/2 measures 5 mm and previously measured 10 mm. No pelvic or inguinal adenopathy. Reproductive: Surgically absent. Other: No pelvic mass or adenopathy. No free pelvic fluid collections. No inguinal mass or adenopathy. No abdominal wall hernia or subcutaneous lesions. Musculoskeletal: No significant bony findings. Remote lumbar fusion hardware. Stable focus of AVN involving the right hip. IMPRESSION: 1. Resolution of left supraclavicular and left axillary adenopathy. No residual measurable disease suggesting an excellent response to treatment. 2. Significant interval improvement in retroperitoneal lymph nodes. No new or progressive findings. 3. Stable mild emphysematous changes. 4. Enlarged pulmonary arteries suggesting pulmonary hypertension. 5. Stable small low-attenuation splenic lesion, likely benign cyst. 6. Emphysema and aortic atherosclerosis. Aortic Atherosclerosis (ICD10-I70.0) and Emphysema (ICD10-J43.9). Electronically Signed   By: Marijo Sanes M.D.   On: 10/24/2020 13:37

## 2020-10-27 NOTE — Assessment & Plan Note (Signed)
She has chronic shortness of breath with minimal exertion Her oxygen saturation fluctuate widely, overall consistent with no signs of hypoxia CT imaging shows signs of pulmonary hypertension I told the patient this is not related to her treatment I will reach out to her cardiologist to see if she needs further work-up

## 2020-10-27 NOTE — Assessment & Plan Note (Signed)
I have reviewed CT imaging with the patient She has achieved complete response with 3 cycles of Bendamustine and rituximab I plan to continue on reduced dose Bendamustine which she tolerated very well My plan would be to complete 6 cycles before switching her over to maintenance rituximab

## 2020-10-27 NOTE — Assessment & Plan Note (Signed)
She has chronic atrial fibrillation On examination today, she was noted to have variable heart rate although by examination, I felt that she is in sinus rhythm She will continue medical management She has no signs or symptoms of congestive heart failure today

## 2020-10-28 ENCOUNTER — Other Ambulatory Visit: Payer: Self-pay

## 2020-10-28 ENCOUNTER — Inpatient Hospital Stay: Payer: Medicare HMO

## 2020-10-28 VITALS — BP 120/67 | HR 60 | Temp 97.6°F | Resp 22

## 2020-10-28 DIAGNOSIS — Z7189 Other specified counseling: Secondary | ICD-10-CM

## 2020-10-28 DIAGNOSIS — Z5111 Encounter for antineoplastic chemotherapy: Secondary | ICD-10-CM | POA: Diagnosis not present

## 2020-10-28 DIAGNOSIS — C8238 Follicular lymphoma grade IIIa, lymph nodes of multiple sites: Secondary | ICD-10-CM

## 2020-10-28 MED ORDER — SODIUM CHLORIDE 0.9 % IV SOLN
10.0000 mg | Freq: Once | INTRAVENOUS | Status: AC
  Administered 2020-10-28: 10 mg via INTRAVENOUS
  Filled 2020-10-28: qty 10

## 2020-10-28 MED ORDER — SODIUM CHLORIDE 0.9 % IV SOLN
60.0000 mg/m2 | Freq: Once | INTRAVENOUS | Status: AC
  Administered 2020-10-28: 100 mg via INTRAVENOUS
  Filled 2020-10-28: qty 4

## 2020-10-28 MED ORDER — SODIUM CHLORIDE 0.9% FLUSH
10.0000 mL | INTRAVENOUS | Status: DC | PRN
  Administered 2020-10-28: 10 mL
  Filled 2020-10-28: qty 10

## 2020-10-28 MED ORDER — SODIUM CHLORIDE 0.9 % IV SOLN
Freq: Once | INTRAVENOUS | Status: AC
  Filled 2020-10-28: qty 250

## 2020-10-28 MED ORDER — HEPARIN SOD (PORK) LOCK FLUSH 100 UNIT/ML IV SOLN
500.0000 [IU] | Freq: Once | INTRAVENOUS | Status: AC | PRN
  Administered 2020-10-28: 500 [IU]
  Filled 2020-10-28: qty 5

## 2020-10-28 NOTE — Patient Instructions (Signed)
[facility] Discharge Instructions for Patients Receiving Chemotherapy  Today you received the following chemotherapy agents Bend[place]a.   To help prevent nausea and vomiting after your treatment, we encourage you to take your nausea medication as prescribed.    If you develop nausea and vomiting that is not controlled by your nausea medication, call the clinic.   BELOW ARE SYMPTOMS THAT SHOULD BE REPORTED IMMEDIATELY:  *FEVER GREATER THAN 100.5 F  *CHILLS WITH OR WITHOUT FEVER  NAUSEA AND VOMITING THAT IS NOT CONTROLLED WITH YOUR NAUSEA MEDICATION  *UNUSUAL SHORTNESS OF BREATH  *UNUSUAL BRUISING OR BLEEDING  TENDERNESS IN MOUTH AND THROAT WITH OR WITHOUT PRESENCE OF ULCERS  *URINARY PROBLEMS  *BOWEL PROBLEMS  UNUSUAL RASH Items with * indicate a potential emergency and should be followed up as soon as possible.  Feel free to call the clinic should you have any questions or concerns. The clinic phone number is [phone].  Please show the CHEMO ALERT CARD at check-in to the Emergency Department and triage nurse.  

## 2020-10-29 ENCOUNTER — Telehealth: Payer: Self-pay | Admitting: *Deleted

## 2020-10-29 DIAGNOSIS — Z9221 Personal history of antineoplastic chemotherapy: Secondary | ICD-10-CM

## 2020-10-29 DIAGNOSIS — I5022 Chronic systolic (congestive) heart failure: Secondary | ICD-10-CM

## 2020-10-29 DIAGNOSIS — Z5111 Encounter for antineoplastic chemotherapy: Secondary | ICD-10-CM

## 2020-10-29 NOTE — Telephone Encounter (Signed)
RE: pt needs an echo with strain done and a follow-up visit with our office after echo, per Meda Coffee and pts Oncologist Received: Today Carrie Ly, LPN Schedule for 1-82-99 echo and Carrie [name] 11-05-20 pt is aware   Pt made aware of appt dates and times by [facility] scheduling.

## 2020-10-29 NOTE — Telephone Encounter (Signed)
Order for echo with strain placed in the system for this pt to have done, per Dr. Meda Coffee and pts Oncologist Dr. Alvy Bimler. Pt will need to have an echo done, and follow-up appt arranged with an APP in our office, once the echo is scheduled and complete.  Message sent to our [facility] schedulers to call the pt back and arrange echo with strain and follow-up appt thereafter, as advised in this message.

## 2020-10-29 NOTE — Telephone Encounter (Signed)
-----   Message from [name], MD sent at 10/28/2020 12:23 AM EST ----- Regarding: FW: mutual patient Carrie [name],  Can you order an echocardiogram with strain (Dg CHF, chemo) and then arrange a follow up appointment after that?  Thank you,  [place] Siren  ----- Message ----- From: [name], MD Sent: 10/27/2020  12:38 PM EST To: [name], MD Subject: mutual patient                                 [name],  How are you? I saw our mutual patient today She has a lot of sensation of dyspnea on minimal exertion Her CT showed signs of pulmonary hypertension. She says she is getting an echo at the [place] soon Do you need see her?  Thanks, Ni

## 2020-10-31 ENCOUNTER — Other Ambulatory Visit: Payer: Self-pay

## 2020-10-31 ENCOUNTER — Ambulatory Visit (HOSPITAL_COMMUNITY): Payer: No Typology Code available for payment source | Attending: Cardiology

## 2020-10-31 DIAGNOSIS — I5022 Chronic systolic (congestive) heart failure: Secondary | ICD-10-CM

## 2020-10-31 DIAGNOSIS — Z5111 Encounter for antineoplastic chemotherapy: Secondary | ICD-10-CM

## 2020-10-31 DIAGNOSIS — Z9221 Personal history of antineoplastic chemotherapy: Secondary | ICD-10-CM | POA: Insufficient documentation

## 2020-10-31 LAB — ECHOCARDIOGRAM COMPLETE
Area-P 1/2: 2.6 cm2
P 1/2 time: 572 msec
S' Lateral: 3.4 cm

## 2020-11-04 NOTE — Progress Notes (Signed)
Virtual Visit via Telephone Note   This visit type was conducted due to national recommendations for restrictions regarding the COVID-19 Pandemic (e.g. social distancing) in an effort to limit this patient's exposure and mitigate transmission in our community.  Due to her co-morbid illnesses, this patient is at least at moderate risk for complications without adequate follow up.  This format is felt to be most appropriate for this patient at this time.  The patient did not have access to video technology/had technical difficulties with video requiring transitioning to audio format only (telephone).  All issues noted in this document were discussed and addressed.  No physical exam could be performed with this format.  Please refer to the patient's chart for her  consent to telehealth for [facility].    Date:  11/05/2020   ID:  Carrie [name], DOB Apr 15, 1941, MRN [id number] The patient was identified using 2 identifiers.  Patient Location: Home Provider Location: Home Office  PCP:  Domenick Bookbinder, MD  Cardiologist:  Ena Dawley, MD   Electrophysiologist:  None   Evaluation Performed:  Follow-Up Visit  Chief Complaint:  Follow-up (Review recent echo)    Patient Profile: Carrie [name] is a 80 y.o. female with:  White coat Hypertension   Follicular lymphoma grade 3  Paroxysmal atrial fibrillation   Admx 9/16 w AF with RVR in setting of Ecoli urosepsis and AKI  Recurrent episodes on OP monitor >> anticoagulation   Flecainide Rx; Apixaban Rx  ETT: no VT  Recurrent AF w RVR >> Amiodarone (02/2019)   HFimpEF (Heart Failure with improved EF)  Dilated cardiomyopathy  CMR 11/16: EF 40-45;  However f/u Echocardiogram 12/16: EF 55-60  admx in 02/2019 with AF w RVR >> DCCV; Amio Rx  Echocardiogram 02/2019: EF 20-25 >> Echocardiogram 05/2019: EF 55-60  Tachycardia induced CM   Echocardiogram 1/22: EF 55-60, Gr 1 DD, GLS -17.9%  Hypothyroidism   Chronic kidney disease  stage 3   Aortic atherosclerosis (CT 10/2020)  Prior CV Studies:  Echocardiogram 10/31/2020 EF 55-60, GR 1 DD, GLS -17.9% (normal), normal RVSF, moderate LAE, mild-moderate MR, mild AI  Echocardiogram 05/24/2019 EF 55-60, GRII DD, normal RVSF, mild LAE, mild MR, trivial AI, PASP 28  Echocardiogram 02/25/2019 EF 20-25, severely reduced RVSF, RVSP 23.5, mild BAE, trivial pericardial effusion, moderate MR, trivial AI  Echocardiogram 10/15/2015 EF 60-65, normal wall motion, severe LAE  ETT 08/20/15 Submaximal exercise; no ventricular arrhythmias.  Venous Duplex 9/16 L Leg neg for DVT  Echo 06/26/15 Inferobasal AK, EF 55%, mild AI, mild MR, mild LAE  Echo 4/16 EF 55-60%, normal wall motion, grade 2 diastolic dysfunction, trivial AI, mild MR, moderate LAE, mild TR, trivial PI, PASP 33 mmHg  Myoview 11/10 Normal, EF 66%  Carotid US 6/05 No ICA stenosis  History of Present Illness:   Ms. Goings was last seen by Dr. Meda Coffee in 2/21.  She saw her oncologist (Dr. Alvy Bimler) earlier this month.  She had symptoms of shortness of breath.  A CT scan had recently shown signs of pulmonary hypertension.  She was set up for a follow-up echocardiogram.  This demonstrated normal EF, mild diastolic dysfunction and normal global longitudinal strain.  RVSF was normal.  She is seen for follow-up. We reviewed the findings on her echocardiogram. She notes lack of energy and weakness. This has been ongoing since she was placed back on chemotherapy. She has occasional episodes of chest discomfort. She has noticed this for years since she was diagnosed with  lymphoma. She has not had any exertional chest discomfort. Her chest symptoms are unchanged. She does not sleep on her back due to prior back surgery. She does not describe orthopnea, paroxysmal nocturnal dyspnea. She has not had leg edema. She has not had syncope.   Past Medical History:  Diagnosis Date  . A-fib ([place])   . Anemia    "years ago"  . Atrial  fibrillation with rapid ventricular response ([place]) 06/25/2015  . Carpal tunnel syndrome, bilateral   . Cervical spondylosis without myelopathy 10/25/2013  . Dental bridge present   . DJD (degenerative joint disease)   . Dysrhythmia    WENT INTO A FIB IN 2017   . Elevated troponin 02/18/2015  . Follicular lymphoma grade 3a ([place]) 02/03/2015  . History of echocardiogram    Echo 12/16: EF 60-65%, no RWMA, severe LAE  . History of nuclear stress test    Myoview 1/17: EF 55%, Normal study. No ischemia or scar.  . Hypothyroid   . Nausea without vomiting 06/20/2015  . Pneumonia   . Stage III chronic kidney disease ([place]) 07/01/2015  . Thrush of mouth and esophagus ([place]) 06/25/2015   Past Surgical History:  Procedure Laterality Date  . ABDOMINAL HYSTERECTOMY    . APPENDECTOMY    . BACK SURGERY     X5-lumbar-fusion  . COLONOSCOPY    . DILATION AND CURETTAGE OF UTERUS    . LYMPH NODE BIOPSY Right 01/21/2015   Procedure: RIGHT GROIN LYMPH NODE BIOPSY;  Surgeon: Erroll Luna, MD;  Location: [place];  Service: General;  Laterality: Right;  . MASS EXCISION Left 08/29/2018   Procedure: EXCISION LEFT BACK  MASS;  Surgeon: Erroll Luna, MD;  Location: [place];  Service: General;  Laterality: Left;  . Ovarian cyst resection    . patelar tendon transplants     Left/right  . PORTACATH PLACEMENT Right 02/13/2015   Procedure: INSERTION PORT-A-CATH WITH ULTRASOUND;  Surgeon: Erroll Luna, MD;  Location: [place];  Service: General;  Laterality: Right;  . PORTACATH PLACEMENT N/A 08/29/2018   Procedure: INSERTION PORT-A-CATH WITH ULTRA SOUND ERAS PATHWAY;  Surgeon: Erroll Luna, MD;  Location: [place];  Service: General;  Laterality: N/A;  . TOTAL KNEE ARTHROPLASTY  2011   Right  . TUBAL LIGATION       Current Meds  Medication Sig  . acetaminophen (TYLENOL) 500 MG tablet Take 500 mg by mouth every 6 (six) hours as needed for moderate pain or headache.  Marland Kitchen acyclovir (ZOVIRAX) 400 MG tablet  Take 1 tablet (400 mg total) by mouth daily.  Marland Kitchen allopurinol (ZYLOPRIM) 300 MG tablet Take 1 tablet (300 mg total) by mouth daily.  Marland Kitchen apixaban (ELIQUIS) 5 MG TABS tablet Take 5 mg by mouth 2 (two) times daily.   . bisacodyl (DULCOLAX) 5 MG EC tablet Take 5 mg by mouth daily as needed for moderate constipation.  . diclofenac sodium (VOLTAREN) 1 % GEL Apply 2 g topically 3 (three) times daily as needed (knee pain).  Marland Kitchen levothyroxine (SYNTHROID, LEVOTHROID) 112 MCG tablet Take 112 mcg by mouth daily.   Marland Kitchen lidocaine-prilocaine (EMLA) cream Apply to affected area once  . metoprolol succinate (TOPROL-XL) 25 MG 24 hr tablet Take 0.5 tablets (12.5 mg total) by mouth daily. Take with or immediately following a meal.  . ondansetron (ZOFRAN) 8 MG tablet Take 1 tablet (8 mg total) by mouth every 8 (eight) hours as needed for refractory nausea / vomiting. Start on day 2 after bendamustine chemo.  Marland Kitchen  polyvinyl alcohol (LIQUIFILM TEARS) 1.4 % ophthalmic solution Place 1 drop into both eyes as needed for dry eyes.  Marland Kitchen prochlorperazine (COMPAZINE) 10 MG tablet Take 1 tablet (10 mg total) by mouth every 6 (six) hours as needed (Nausea or vomiting).     Allergies:   Amiodarone   Social History   Tobacco Use  . Smoking status: Never Smoker  . Smokeless tobacco: Never Used  Vaping Use  . Vaping Use: Never used  Substance Use Topics  . Alcohol use: Yes    Comment: occassional wine  . Drug use: No     Family Hx: The patient's family history includes Cancer in her mother and sister.  ROS:   Please see the history of present illness.      Labs/Other Tests and Data Reviewed:    EKG:  No ECG reviewed.  Recent Labs: 10/27/2020: ALT 11; BUN 27; Creatinine 1.09; Hemoglobin 12.5; Platelet Count 160; Potassium 4.5; Sodium 141   Recent Lipid Panel No results found for: CHOL, TRIG, HDL, CHOLHDL, LDLCALC, LDLDIRECT  Wt Readings from Last 3 Encounters:  11/05/20 139 lb (63 kg)  10/27/20 139 lb 12.8 oz (63.4 kg)   09/29/20 139 lb 9.6 oz (63.3 kg)     Risk Assessment/Calculations:     CHA2DS2-VASc Score = 6  This indicates a 9.7% annual risk of stroke. The patient's score is based upon: CHF History: Yes HTN History: Yes Diabetes History: No Stroke History: No Vascular Disease History: Yes Age Score: 2 Gender Score: 1     Objective:    Vital Signs:  BP 106/72   Pulse 79   Ht 5' (1.524 m)   Wt 139 lb (63 kg)   BMI 27.15 kg/m    VITAL SIGNS:  reviewed GEN:  no acute distress PSYCH:  normal affect  ASSESSMENT & PLAN:    1. HFimpEF (Heart Failure with improved EF) 2. Pulmonary hypertension (HCC) The patient has a history of tachycardia induced cardiomyopathy with full recovery of her ejection fraction. Recent CT scan suggested pulmonary hypertension. The patient notes weakness and lack of energy as well as some mild shortness of breath. Her echocardiogram demonstrated normal LV function, normal strain and normal RVSF. I reviewed her chart. She is not anemic. She is no longer on amiodarone. She does not really have symptoms that sound consistent with volume excess. She was in sinus rhythm during her echocardiogram. Therefore, no further work-up is needed at this time. I have asked her to contact us if she has worsening symptoms or changing symptoms. Otherwise, I will have her see Dr. Delton See again in 3 months.  3. PAF (paroxysmal atrial fibrillation) (HCC) She seems to be maintaining sinus rhythm. She is tolerating anticoagulation. Recent creatinine, hemoglobin stable. Continue current dose of Apixaban.  4. Aortic atherosclerosis (HCC) She is not on aspirin as she is on Apixaban.  5. Grade 3a follicular lymphoma of lymph nodes of multiple regions [facility]) Continue follow-up with oncology.  Time:   Today, I have spent 17 minutes with the patient with telehealth technology discussing the above problems.     Medication Adjustments/Labs and Tests Ordered: Current medicines are reviewed at  length with the patient today.  Concerns regarding medicines are outlined above.   Tests Ordered: No orders of the defined types were placed in this encounter.   Medication Changes: No orders of the defined types were placed in this encounter.   Follow Up:  In Person in 3 month(s)  Signed, Tereso Newcomer, PA-C  11/05/2020 3:59 PM     [facility]

## 2020-11-05 ENCOUNTER — Telehealth: Payer: Self-pay | Admitting: *Deleted

## 2020-11-05 ENCOUNTER — Telehealth (INDEPENDENT_AMBULATORY_CARE_PROVIDER_SITE_OTHER): Payer: Medicare HMO | Admitting: Physician Assistant

## 2020-11-05 ENCOUNTER — Other Ambulatory Visit: Payer: Self-pay

## 2020-11-05 ENCOUNTER — Encounter: Payer: Self-pay | Admitting: Physician Assistant

## 2020-11-05 VITALS — BP 106/72 | HR 79 | Ht 60.0 in | Wt 139.0 lb

## 2020-11-05 DIAGNOSIS — C8238 Follicular lymphoma grade IIIa, lymph nodes of multiple sites: Secondary | ICD-10-CM

## 2020-11-05 DIAGNOSIS — I10 Essential (primary) hypertension: Secondary | ICD-10-CM

## 2020-11-05 DIAGNOSIS — I480 Paroxysmal atrial fibrillation: Secondary | ICD-10-CM | POA: Diagnosis not present

## 2020-11-05 DIAGNOSIS — I5020 Unspecified systolic (congestive) heart failure: Secondary | ICD-10-CM

## 2020-11-05 DIAGNOSIS — I272 Pulmonary hypertension, unspecified: Secondary | ICD-10-CM

## 2020-11-05 DIAGNOSIS — N1831 Chronic kidney disease, stage 3a: Secondary | ICD-10-CM

## 2020-11-05 DIAGNOSIS — I700 Atherosclerosis of aorta: Secondary | ICD-10-CM | POA: Diagnosis not present

## 2020-11-05 NOTE — Patient Instructions (Signed)
Medication Instructions:  Your physician recommends that you continue on your current medications as directed. Please refer to the Current Medication list given to you today.  *If you need a refill on your cardiac medications before your next appointment, please call your pharmacy*  Lab Work: None ordered today  Testing/Procedures: None ordered today  Follow-Up: At [facility], you and your health needs are our priority.  As part of our continuing mission to provide you with exceptional heart care, we have created designated Provider Care Teams.  These Care Teams include your primary Cardiologist (physician) and Advanced Practice Providers (APPs -  Physician Assistants and Nurse Practitioners) who all work together to provide you with the care you need, when you need it.  Your next appointment:   4 month(s) on 02/26/21 at 4:00PM  The format for your next appointment:   In Person  Provider:   Ena Dawley, MD

## 2020-11-05 NOTE — Telephone Encounter (Signed)
  Patient Consent for Virtual Visit         Carrie [name] has provided verbal consent on 11/05/2020 for a virtual visit (video or telephone).   CONSENT FOR VIRTUAL VISIT FOR:  Carrie [name]  By participating in this virtual visit I agree to the following:  I hereby voluntarily request, consent and authorize [facility] and its employed or contracted physicians, physician assistants, nurse practitioners or other licensed health care professionals (the Practitioner), to provide me with telemedicine health care services (the "Services") as deemed necessary by the treating Practitioner. I acknowledge and consent to receive the Services by the Practitioner via telemedicine. I understand that the telemedicine visit will involve communicating with the Practitioner through live audiovisual communication technology and the disclosure of certain medical information by electronic transmission. I acknowledge that I have been given the opportunity to request an in-person assessment or other available alternative prior to the telemedicine visit and am voluntarily participating in the telemedicine visit.  I understand that I have the right to withhold or withdraw my consent to the use of telemedicine in the course of my care at any time, without affecting my right to future care or treatment, and that the Practitioner or I may terminate the telemedicine visit at any time. I understand that I have the right to inspect all information obtained and/or recorded in the course of the telemedicine visit and may receive copies of available information for a reasonable fee.  I understand that some of the potential risks of receiving the Services via telemedicine include:  Marland Kitchen Delay or interruption in medical evaluation due to technological equipment failure or disruption; . Information transmitted may not be sufficient (e.g. poor resolution of images) to allow for appropriate medical decision making by the Practitioner;  and/or  . In rare instances, security protocols could fail, causing a breach of personal health information.  Furthermore, I acknowledge that it is my responsibility to provide information about my medical history, conditions and care that is complete and accurate to the best of my ability. I acknowledge that Practitioner's advice, recommendations, and/or decision may be based on factors not within their control, such as incomplete or inaccurate data provided by me or distortions of diagnostic images or specimens that may result from electronic transmissions. I understand that the practice of medicine is not an exact science and that Practitioner makes no warranties or guarantees regarding treatment outcomes. I acknowledge that a copy of this consent can be made available to me via my patient portal ([place]), or I can request a printed copy by calling the office of [place].    I understand that my insurance will be billed for this visit.   I have read or had this consent read to me. . I understand the contents of this consent, which adequately explains the benefits and risks of the Services being provided via telemedicine.  . I have been provided ample opportunity to ask questions regarding this consent and the Services and have had my questions answered to my satisfaction. . I give my informed consent for the services to be provided through the use of telemedicine in my medical care

## 2020-11-21 MED FILL — Dexamethasone Sodium Phosphate Inj 100 MG/10ML: INTRAMUSCULAR | Qty: 1 | Status: AC

## 2020-11-24 ENCOUNTER — Ambulatory Visit: Payer: No Typology Code available for payment source | Admitting: Hematology and Oncology

## 2020-11-24 ENCOUNTER — Other Ambulatory Visit: Payer: No Typology Code available for payment source

## 2020-11-24 ENCOUNTER — Telehealth: Payer: Self-pay | Admitting: *Deleted

## 2020-11-24 ENCOUNTER — Ambulatory Visit: Payer: No Typology Code available for payment source

## 2020-11-24 ENCOUNTER — Telehealth: Payer: Self-pay | Admitting: Hematology and Oncology

## 2020-11-24 NOTE — Telephone Encounter (Signed)
Pt called to cancel appt for labs, infusion and doctor visit. Pt was not available for reschedule time. Message was sent to scheduling to reschedule patient for appts.

## 2020-11-24 NOTE — Telephone Encounter (Signed)
Called pt to confirm appts on 2/16 and 2/17 - pt is aware of appt date and time

## 2020-11-25 ENCOUNTER — Ambulatory Visit: Payer: No Typology Code available for payment source

## 2020-11-26 ENCOUNTER — Ambulatory Visit: Payer: Medicare HMO

## 2020-12-03 ENCOUNTER — Inpatient Hospital Stay (HOSPITAL_BASED_OUTPATIENT_CLINIC_OR_DEPARTMENT_OTHER): Payer: No Typology Code available for payment source | Admitting: Hematology and Oncology

## 2020-12-03 ENCOUNTER — Inpatient Hospital Stay: Payer: No Typology Code available for payment source | Attending: Hematology and Oncology

## 2020-12-03 ENCOUNTER — Inpatient Hospital Stay: Payer: No Typology Code available for payment source

## 2020-12-03 ENCOUNTER — Encounter: Payer: Self-pay | Admitting: Hematology and Oncology

## 2020-12-03 ENCOUNTER — Telehealth: Payer: Self-pay | Admitting: Hematology and Oncology

## 2020-12-03 ENCOUNTER — Other Ambulatory Visit: Payer: Self-pay

## 2020-12-03 VITALS — BP 120/78 | HR 71 | Temp 98.4°F | Resp 20

## 2020-12-03 DIAGNOSIS — C8238 Follicular lymphoma grade IIIa, lymph nodes of multiple sites: Secondary | ICD-10-CM | POA: Diagnosis present

## 2020-12-03 DIAGNOSIS — M25512 Pain in left shoulder: Secondary | ICD-10-CM | POA: Diagnosis not present

## 2020-12-03 DIAGNOSIS — N183 Chronic kidney disease, stage 3 unspecified: Secondary | ICD-10-CM | POA: Insufficient documentation

## 2020-12-03 DIAGNOSIS — Z5112 Encounter for antineoplastic immunotherapy: Secondary | ICD-10-CM | POA: Diagnosis present

## 2020-12-03 DIAGNOSIS — Z5111 Encounter for antineoplastic chemotherapy: Secondary | ICD-10-CM | POA: Diagnosis not present

## 2020-12-03 DIAGNOSIS — N1831 Chronic kidney disease, stage 3a: Secondary | ICD-10-CM

## 2020-12-03 DIAGNOSIS — Z7189 Other specified counseling: Secondary | ICD-10-CM

## 2020-12-03 DIAGNOSIS — Z9221 Personal history of antineoplastic chemotherapy: Secondary | ICD-10-CM | POA: Diagnosis not present

## 2020-12-03 DIAGNOSIS — G8929 Other chronic pain: Secondary | ICD-10-CM

## 2020-12-03 DIAGNOSIS — D61818 Other pancytopenia: Secondary | ICD-10-CM

## 2020-12-03 LAB — CBC WITH DIFFERENTIAL (CANCER CENTER ONLY)
Abs Immature Granulocytes: 0.01 10*3/uL (ref 0.00–0.07)
Basophils Absolute: 0 10*3/uL (ref 0.0–0.1)
Basophils Relative: 1 %
Eosinophils Absolute: 0.1 10*3/uL (ref 0.0–0.5)
Eosinophils Relative: 4 %
HCT: 35.2 % — ABNORMAL LOW (ref 36.0–46.0)
Hemoglobin: 11.9 g/dL — ABNORMAL LOW (ref 12.0–15.0)
Immature Granulocytes: 0 %
Lymphocytes Relative: 8 %
Lymphs Abs: 0.3 10*3/uL — ABNORMAL LOW (ref 0.7–4.0)
MCH: 33.1 pg (ref 26.0–34.0)
MCHC: 33.8 g/dL (ref 30.0–36.0)
MCV: 97.8 fL (ref 80.0–100.0)
Monocytes Absolute: 0.5 10*3/uL (ref 0.1–1.0)
Monocytes Relative: 13 %
Neutro Abs: 2.7 10*3/uL (ref 1.7–7.7)
Neutrophils Relative %: 74 %
Platelet Count: 148 10*3/uL — ABNORMAL LOW (ref 150–400)
RBC: 3.6 MIL/uL — ABNORMAL LOW (ref 3.87–5.11)
RDW: 13.3 % (ref 11.5–15.5)
WBC Count: 3.6 10*3/uL — ABNORMAL LOW (ref 4.0–10.5)
nRBC: 0 % (ref 0.0–0.2)

## 2020-12-03 LAB — CMP (CANCER CENTER ONLY)
ALT: 12 U/L (ref 0–44)
AST: 16 U/L (ref 15–41)
Albumin: 3.8 g/dL (ref 3.5–5.0)
Alkaline Phosphatase: 81 U/L (ref 38–126)
Anion gap: 7 (ref 5–15)
BUN: 25 mg/dL — ABNORMAL HIGH (ref 8–23)
CO2: 25 mmol/L (ref 22–32)
Calcium: 8.8 mg/dL — ABNORMAL LOW (ref 8.9–10.3)
Chloride: 108 mmol/L (ref 98–111)
Creatinine: 1.21 mg/dL — ABNORMAL HIGH (ref 0.44–1.00)
GFR, Estimated: 46 mL/min — ABNORMAL LOW (ref >60)
Glucose, Bld: 100 mg/dL — ABNORMAL HIGH (ref 70–99)
Potassium: 4.3 mmol/L (ref 3.5–5.1)
Sodium: 140 mmol/L (ref 135–145)
Total Bilirubin: 0.6 mg/dL (ref 0.3–1.2)
Total Protein: 6.1 g/dL — ABNORMAL LOW (ref 6.5–8.1)

## 2020-12-03 LAB — URIC ACID: Uric Acid, Serum: 3.2 mg/dL (ref 2.5–7.1)

## 2020-12-03 MED ORDER — SODIUM CHLORIDE 0.9 % IV SOLN
60.0000 mg/m2 | Freq: Once | INTRAVENOUS | Status: AC
  Administered 2020-12-03: 100 mg via INTRAVENOUS
  Filled 2020-12-03: qty 4

## 2020-12-03 MED ORDER — SODIUM CHLORIDE 0.9 % IV SOLN
375.0000 mg/m2 | Freq: Once | INTRAVENOUS | Status: AC
  Administered 2020-12-03: 600 mg via INTRAVENOUS
  Filled 2020-12-03: qty 10

## 2020-12-03 MED ORDER — PALONOSETRON HCL INJECTION 0.25 MG/5ML
0.2500 mg | Freq: Once | INTRAVENOUS | Status: AC
  Administered 2020-12-03: 0.25 mg via INTRAVENOUS

## 2020-12-03 MED ORDER — DIPHENHYDRAMINE HCL 25 MG PO CAPS
ORAL_CAPSULE | ORAL | Status: AC
  Filled 2020-12-03: qty 1

## 2020-12-03 MED ORDER — DIPHENHYDRAMINE HCL 25 MG PO CAPS
25.0000 mg | ORAL_CAPSULE | Freq: Once | ORAL | Status: AC
  Administered 2020-12-03: 25 mg via ORAL

## 2020-12-03 MED ORDER — HEPARIN SOD (PORK) LOCK FLUSH 100 UNIT/ML IV SOLN
500.0000 [IU] | Freq: Once | INTRAVENOUS | Status: AC | PRN
  Administered 2020-12-03: 500 [IU]
  Filled 2020-12-03: qty 5

## 2020-12-03 MED ORDER — ACETAMINOPHEN 325 MG PO TABS
ORAL_TABLET | ORAL | Status: AC
  Filled 2020-12-03: qty 2

## 2020-12-03 MED ORDER — SODIUM CHLORIDE 0.9% FLUSH
10.0000 mL | Freq: Once | INTRAVENOUS | Status: AC
  Administered 2020-12-03: 10 mL
  Filled 2020-12-03: qty 10

## 2020-12-03 MED ORDER — ACETAMINOPHEN 325 MG PO TABS
650.0000 mg | ORAL_TABLET | Freq: Once | ORAL | Status: AC
  Administered 2020-12-03: 650 mg via ORAL

## 2020-12-03 MED ORDER — PALONOSETRON HCL INJECTION 0.25 MG/5ML
INTRAVENOUS | Status: AC
  Filled 2020-12-03: qty 5

## 2020-12-03 MED ORDER — SODIUM CHLORIDE 0.9% FLUSH
10.0000 mL | INTRAVENOUS | Status: DC | PRN
  Administered 2020-12-03: 10 mL
  Filled 2020-12-03: qty 10

## 2020-12-03 MED ORDER — DEXAMETHASONE SODIUM PHOSPHATE 100 MG/10ML IJ SOLN
10.0000 mg | Freq: Once | INTRAMUSCULAR | Status: AC
  Administered 2020-12-03: 10 mg via INTRAVENOUS
  Filled 2020-12-03: qty 10

## 2020-12-03 MED ORDER — SODIUM CHLORIDE 0.9 % IV SOLN
Freq: Once | INTRAVENOUS | Status: AC
  Filled 2020-12-03: qty 250

## 2020-12-03 NOTE — Assessment & Plan Note (Signed)
According to the patient, she will need rotator cuff surgery in the future The patient would like to hold off surgery until completion of chemotherapy which I think is reasonable

## 2020-12-03 NOTE — Assessment & Plan Note (Signed)
She has chronic kidney disease stage III.  This is stable Continue medical management

## 2020-12-03 NOTE — Assessment & Plan Note (Signed)
Her last CT scan showed complete response with 3 cycles of Bendamustine and rituximab I plan to continue on reduced dose Bendamustine which she tolerated very well My plan would be to complete 6 cycles before switching her over to maintenance rituximab

## 2020-12-03 NOTE — Progress Notes (Signed)
[place] OFFICE PROGRESS NOTE  Patient Care Team: Domenick Bookbinder, MD as PCP - General (Internal Medicine) [name], MD as PCP - Cardiology (Cardiology) [name], MD as Attending Physician (Internal Medicine)  ASSESSMENT & PLAN:  Grade 3a follicular lymphoma of lymph nodes of multiple regions [facility]) Her last CT scan showed complete response with 3 cycles of Bendamustine and rituximab I plan to continue on reduced dose Bendamustine which she tolerated very well My plan would be to complete 6 cycles before switching her over to maintenance rituximab  Chronic kidney disease, stage III (moderate) ([place]) She has chronic kidney disease stage III.  This is stable Continue medical management  Pancytopenia, acquired [facility]) This is due to her treatment She is not symptomatic Observe closely for now  Chronic left shoulder pain According to the patient, she will need rotator cuff surgery in the future The patient would like to hold off surgery until completion of chemotherapy which I think is reasonable   No orders of the defined types were placed in this encounter.   All questions were answered. The patient knows to call the clinic with any problems, questions or concerns. The total time spent in the appointment was 20 minutes encounter with patients including review of chart and various tests results, discussions about plan of care and coordination of care plan   [name], MD 12/03/2020 2:13 PM  INTERVAL HISTORY: Please see below for problem oriented charting. She returns for further follow-up She is doing well No recent infection, fever or chills She desires to have shoulder surgery in the near future after chemo  SUMMARY OF ONCOLOGIC HISTORY: Oncology History  Grade 3a follicular lymphoma of lymph nodes of multiple regions ([place])  01/21/2015 Surgery   She underwent excisional lymph node biopsy that came back follicular lymphoma grade 3    01/21/2015 Pathology Results   Accession: [id number] biopsy confirmed follicular lymphoma   2/54/2706 Imaging   Echocardiogram showed ejection fraction of 55-60%   02/11/2015 Imaging    PET CT scan show possible splenic involvement and diffuse lymphadenopathy throughout   02/11/2015 Bone Marrow Biopsy    bone marrow biopsy was performed and is involved by lymphoma with translocation of igH/BCL2   02/13/2015 Procedure   She had port placement.   02/17/2015 - 06/02/2015 Chemotherapy   She received R-CHOP chemo x 6   02/17/2015 Adverse Reaction   She had mild infusion reaction with cycle 1 of treatment.   04/18/2015 Imaging   PET CT scan showed near complete response to treatment.   04/22/2015 Adverse Reaction   Vincristine dose was reduced by 50% due to neuropathy from cycle 4 onwards   06/25/2015 - 07/06/2015 Hospital Admission   She was hospitalized for recent sepsis/bacteremia and a fib with RVR   07/11/2015 Imaging   repeat PEt scan showed complete response to Rx   07/14/2015 - 06/16/2017 Chemotherapy   She received maintenance Rituximab every 60 days   12/15/2015 Imaging   PET CT showed no evidence of cancer recurrence   06/15/2016 PET scan   No evidence of active lymphoma on skullbase to thigh FDG PET scan. Small LEFT periaortic retroperitoneal lymph nodes without significant metabolic activity ( [place]auville 1). No change from prior   03/24/2017 Imaging   1. Borderline enlarged abdominal retroperitoneal lymph nodes, stable. No new adenopathy in the chest, abdomen or pelvis. 2. Aortic atherosclerosis (ICD10-170.0). 3. Enlarged pulmonary arteries, indicative of pulmonary arterial hypertension.   09/14/2017 PET scan  Stable exam. No evidence of active lymphoma or other acute findings. (Deauville score 1)   08/08/2018 PET scan   1. Evidence of progressive/recurrent disease, as evidenced by new hypermetabolic nodes and subcutaneous nodule/nodes throughout left chest and abdomen.  (Deauville 5). 2. Likely physiologic right nasopharyngeal hypermetabolism. Recommend attention on follow-up. 3. Incidental findings, including pulmonary artery enlargement, suggesting pulmonary arterial hypertension, stable left lower lobe pulmonary nodule, and aortic Atherosclerosis (ICD10-I70.0).   08/29/2018 Initial Biopsy   Soft tissue simple excision left back: Follicular lymphoma grade 1-2 positive for CD20, PAX5, bcl-6, and bcl-2.  Ki-67 30%   09/11/2018 - 02/20/2019 Chemotherapy   The patient had Bendamustine and Rituximab   12/13/2018 Imaging   1. Response to therapy, as evidenced by resolution of left chest wall nodularity and decrease in size of small left axillary nodes. 2. No residual soft tissue thickening at the site of hypermetabolism along the posterior left eleventh rib. Resolution of adjacent subcutaneous nodularity. 3. No new or progressive disease identified. 4.  Aortic Atherosclerosis (ICD10-I70.0). 5. Bilateral pulmonary nodules, similar. 6. Trace air within the urinary bladder could be iatrogenic. Possible pericystic edema. Correlate with symptoms of cystitis and recent instrumentation.   07/30/2019 Imaging   Ct chest, abdomen and pelvis No findings suspicious for active lymphoma in the chest, abdomen, or pelvis.   Small bilateral pulmonary nodules, unchanged, benign.   Again noted is trace nondependent gas within the bladder. Correlate for recent intervention.   07/09/2020 Imaging   MRI imaging performed at the [facility] on the left shoulder reviewed significant lymphadenopathy, worrisome for recurrent lymphoma   07/23/2020 Imaging   1. Recurrent lymphoma as evidenced by enlarging left pectoral and abdominal retroperitoneal lymph nodes. Small left supraclavicular lymph nodes are new. 2. Avascular necrosis in the right femoral head with secondary degenerative osteoarthritis in the right hip. 3.  Aortic atherosclerosis (ICD10-I70.0). 4. Enlarged pulmonic trunk,  indicative of pulmonary arterial hypertension.     08/04/2020 -  Chemotherapy   The patient had Bendamustine and Rituxan   10/24/2020 Imaging   1. Resolution of left supraclavicular and left axillary adenopathy. No residual measurable disease suggesting an excellent response to treatment. 2. Significant interval improvement in retroperitoneal lymph nodes. No new or progressive findings. 3. Stable mild emphysematous changes. 4. Enlarged pulmonary arteries suggesting pulmonary hypertension. 5. Stable small low-attenuation splenic lesion, likely benign cyst. 6. Emphysema and aortic atherosclerosis.     REVIEW OF SYSTEMS:   Constitutional: Denies fevers, chills or abnormal weight loss Eyes: Denies blurriness of vision Ears, nose, mouth, throat, and face: Denies mucositis or sore throat Respiratory: Denies cough, dyspnea or wheezes Cardiovascular: Denies palpitation, chest discomfort or lower extremity swelling Gastrointestinal:  Denies nausea, heartburn or change in bowel habits Skin: Denies abnormal skin rashes Lymphatics: Denies new lymphadenopathy or easy bruising Neurological:Denies numbness, tingling or new weaknesses Behavioral/Psych: Mood is stable, no new changes  All other systems were reviewed with the patient and are negative.  I have reviewed the past medical history, past surgical history, social history and family history with the patient and they are unchanged from previous note.  ALLERGIES:  is allergic to amiodarone.  MEDICATIONS:  Current Outpatient Medications  Medication Sig Dispense Refill  . acetaminophen (TYLENOL) 500 MG tablet Take 500 mg by mouth every 6 (six) hours as needed for moderate pain or headache.    Marland Kitchen acyclovir (ZOVIRAX) 400 MG tablet Take 1 tablet (400 mg total) by mouth daily. 30 tablet 3  . allopurinol (ZYLOPRIM) 300 MG tablet  Take 1 tablet (300 mg total) by mouth daily. 30 tablet 3  . apixaban (ELIQUIS) 5 MG TABS tablet Take 5 mg by mouth 2  (two) times daily.     . bisacodyl (DULCOLAX) 5 MG EC tablet Take 5 mg by mouth daily as needed for moderate constipation.    . diclofenac sodium (VOLTAREN) 1 % GEL Apply 2 g topically 3 (three) times daily as needed (knee pain). 100 g 0  . levothyroxine (SYNTHROID, LEVOTHROID) 112 MCG tablet Take 112 mcg by mouth daily.   9  . lidocaine-prilocaine (EMLA) cream Apply to affected area once 30 g 3  . metoprolol succinate (TOPROL-XL) 25 MG 24 hr tablet Take 0.5 tablets (12.5 mg total) by mouth daily. Take with or immediately following a meal. 90 tablet 3  . ondansetron (ZOFRAN) 8 MG tablet Take 1 tablet (8 mg total) by mouth every 8 (eight) hours as needed for refractory nausea / vomiting. Start on day 2 after bendamustine chemo. 30 tablet 1  . polyvinyl alcohol (LIQUIFILM TEARS) 1.4 % ophthalmic solution Place 1 drop into both eyes as needed for dry eyes.    Marland Kitchen prochlorperazine (COMPAZINE) 10 MG tablet Take 1 tablet (10 mg total) by mouth every 6 (six) hours as needed (Nausea or vomiting). 30 tablet 1   No current facility-administered medications for this visit.   Facility-Administered Medications Ordered in Other Visits  Medication Dose Route Frequency Provider Last Rate Last Admin  . bendamustine (BENDEKA) 100 mg in sodium chloride 0.9 % 50 mL (1.8519 mg/mL) chemo infusion  60 mg/m2 (Treatment Plan Recorded) Intravenous Once Alvy Bimler, Geremiah Fussell, MD      . heparin lock flush 100 unit/mL  500 Units Intracatheter Once PRN Alvy Bimler, Sherea Liptak, MD      . ondansetron (ZOFRAN) 8 mg in sodium chloride 0.9 % 50 mL IVPB   Intravenous Once Jalisa Sacco, MD      . sodium chloride 0.9 % injection 10 mL  10 mL Intravenous PRN Alvy Bimler, Lorre Opdahl, MD   10 mL at 04/05/17 1315  . sodium chloride flush (NS) 0.9 % injection 10 mL  10 mL Intracatheter Once Alvy Bimler, Redith Drach, MD      . sodium chloride flush (NS) 0.9 % injection 10 mL  10 mL Intracatheter PRN Alvy Bimler, Lanissa Cashen, MD        PHYSICAL EXAMINATION: ECOG PERFORMANCE STATUS: 1 - Symptomatic  but completely ambulatory  Vitals:   12/03/20 1131  BP: 133/69  Pulse: 72  Resp: 20  Temp: (!) 96.8 F (36 C)  SpO2: 100%   Filed Weights   12/03/20 1131  Weight: 142 lb 3.2 oz (64.5 kg)    GENERAL:alert, no distress and comfortable SKIN: skin color, texture, turgor are normal, no rashes or significant lesions EYES: normal, Conjunctiva are pink and non-injected, sclera clear OROPHARYNX:no exudate, no erythema and lips, buccal mucosa, and tongue normal  NECK: supple, thyroid normal size, non-tender, without nodularity LYMPH:  no palpable lymphadenopathy in the cervical, axillary or inguinal LUNGS: clear to auscultation and percussion with normal breathing effort HEART: regular rate & rhythm and no murmurs and no lower extremity edema ABDOMEN:abdomen soft, non-tender and normal bowel sounds Musculoskeletal:no cyanosis of digits and no clubbing  NEURO: alert & oriented x 3 with fluent speech, no focal motor/sensory deficits  LABORATORY DATA:  I have reviewed the data as listed    Component Value Date/Time   NA 140 12/03/2020 1119   NA 143 07/16/2019 1132   NA 142 08/29/2017 0254  K 4.3 12/03/2020 1119   K 4.2 08/29/2017 0838   CL 108 12/03/2020 1119   CO2 25 12/03/2020 1119   CO2 23 08/29/2017 0838   GLUCOSE 100 (H) 12/03/2020 1119   GLUCOSE 97 08/29/2017 0838   BUN 25 (H) 12/03/2020 1119   BUN 27 07/16/2019 1132   BUN 24.0 08/29/2017 0838   CREATININE 1.21 (H) 12/03/2020 1119   CREATININE 1.0 08/29/2017 0838   CALCIUM 8.8 (L) 12/03/2020 1119   CALCIUM 8.9 08/29/2017 0838   PROT 6.1 (L) 12/03/2020 1119   PROT 6.4 08/29/2017 0838   ALBUMIN 3.8 12/03/2020 1119   ALBUMIN 3.8 08/29/2017 0838   AST 16 12/03/2020 1119   AST 18 08/29/2017 0838   ALT 12 12/03/2020 1119   ALT 16 08/29/2017 0838   ALKPHOS 81 12/03/2020 1119   ALKPHOS 93 08/29/2017 0838   BILITOT 0.6 12/03/2020 1119   BILITOT 0.33 08/29/2017 0838   GFRNONAA 46 (L) 12/03/2020 1119   GFRAA 58 (L)  05/26/2020 1103   GFRAA 39 (L) 07/30/2019 1010    No results found for: SPEP, UPEP  Lab Results  Component Value Date   WBC 3.6 (L) 12/03/2020   NEUTROABS 2.7 12/03/2020   HGB 11.9 (L) 12/03/2020   HCT 35.2 (L) 12/03/2020   MCV 97.8 12/03/2020   PLT 148 (L) 12/03/2020      Chemistry      Component Value Date/Time   NA 140 12/03/2020 1119   NA 143 07/16/2019 1132   NA 142 08/29/2017 0838   K 4.3 12/03/2020 1119   K 4.2 08/29/2017 0838   CL 108 12/03/2020 1119   CO2 25 12/03/2020 1119   CO2 23 08/29/2017 0838   BUN 25 (H) 12/03/2020 1119   BUN 27 07/16/2019 1132   BUN 24.0 08/29/2017 0838   CREATININE 1.21 (H) 12/03/2020 1119   CREATININE 1.0 08/29/2017 0838      Component Value Date/Time   CALCIUM 8.8 (L) 12/03/2020 1119   CALCIUM 8.9 08/29/2017 0838   ALKPHOS 81 12/03/2020 1119   ALKPHOS 93 08/29/2017 0838   AST 16 12/03/2020 1119   AST 18 08/29/2017 0838   ALT 12 12/03/2020 1119   ALT 16 08/29/2017 0838   BILITOT 0.6 12/03/2020 1119   BILITOT 0.33 08/29/2017 1855

## 2020-12-03 NOTE — Assessment & Plan Note (Signed)
This is due to her treatment She is not symptomatic Observe closely for now

## 2020-12-03 NOTE — Telephone Encounter (Signed)
Scheduled appts per 2/16 sch msg. Gave pt a print of AVS.

## 2020-12-04 ENCOUNTER — Inpatient Hospital Stay: Payer: No Typology Code available for payment source

## 2020-12-04 VITALS — BP 120/69 | HR 60 | Temp 98.2°F | Resp 18

## 2020-12-04 DIAGNOSIS — C8238 Follicular lymphoma grade IIIa, lymph nodes of multiple sites: Secondary | ICD-10-CM

## 2020-12-04 DIAGNOSIS — Z7189 Other specified counseling: Secondary | ICD-10-CM

## 2020-12-04 DIAGNOSIS — Z5112 Encounter for antineoplastic immunotherapy: Secondary | ICD-10-CM | POA: Diagnosis not present

## 2020-12-04 MED ORDER — HEPARIN SOD (PORK) LOCK FLUSH 100 UNIT/ML IV SOLN
500.0000 [IU] | Freq: Once | INTRAVENOUS | Status: AC | PRN
  Administered 2020-12-04: 500 [IU]
  Filled 2020-12-04: qty 5

## 2020-12-04 MED ORDER — SODIUM CHLORIDE 0.9 % IV SOLN
10.0000 mg | Freq: Once | INTRAVENOUS | Status: AC
  Administered 2020-12-04: 10 mg via INTRAVENOUS
  Filled 2020-12-04: qty 10

## 2020-12-04 MED ORDER — SODIUM CHLORIDE 0.9 % IV SOLN
Freq: Once | INTRAVENOUS | Status: AC
  Filled 2020-12-04: qty 250

## 2020-12-04 MED ORDER — SODIUM CHLORIDE 0.9% FLUSH
10.0000 mL | INTRAVENOUS | Status: DC | PRN
  Administered 2020-12-04: 10 mL
  Filled 2020-12-04: qty 10

## 2020-12-04 MED ORDER — SODIUM CHLORIDE 0.9 % IV SOLN
60.0000 mg/m2 | Freq: Once | INTRAVENOUS | Status: AC
  Administered 2020-12-04: 100 mg via INTRAVENOUS
  Filled 2020-12-04: qty 4

## 2020-12-04 NOTE — Patient Instructions (Signed)
[facility] Discharge Instructions for Patients Receiving Chemotherapy  Today you received the following chemotherapy agents Bendeka.  To help prevent nausea and vomiting after your treatment, we encourage you to take your nausea medication as directed.   If you develop nausea and vomiting that is not controlled by your nausea medication, call the clinic.   BELOW ARE SYMPTOMS THAT SHOULD BE REPORTED IMMEDIATELY:  *FEVER GREATER THAN 100.5 F  *CHILLS WITH OR WITHOUT FEVER  NAUSEA AND VOMITING THAT IS NOT CONTROLLED WITH YOUR NAUSEA MEDICATION  *UNUSUAL SHORTNESS OF BREATH  *UNUSUAL BRUISING OR BLEEDING  TENDERNESS IN MOUTH AND THROAT WITH OR WITHOUT PRESENCE OF ULCERS  *URINARY PROBLEMS  *BOWEL PROBLEMS  UNUSUAL RASH Items with * indicate a potential emergency and should be followed up as soon as possible.  Feel free to call the clinic should you have any questions or concerns. The clinic phone number is [phone].  Please show the CHEMO ALERT CARD at check-in to the Emergency Department and triage nurse.   

## 2020-12-08 ENCOUNTER — Telehealth: Payer: Self-pay

## 2020-12-08 NOTE — Telephone Encounter (Signed)
Return call to Pt. Informed her per Dr. Alvy Bimler to continue to take acyclovir. And discontinue allopurinol

## 2020-12-08 NOTE — Telephone Encounter (Signed)
-----   Message from [name], MD sent at 12/08/2020 10:41 AM EST ----- Regarding: RE: medications No need allopurinol but continue acyclovir ----- Message ----- From: Kelli Hope, LPN Sent: 6/38/9373  10:40 AM EST To: [name], MD Subject: medications                                    Hi Dr Alvy Bimler above  Pt wants to know should she still keep taking allopurinol and acyclovir if she is not having treatment.

## 2020-12-22 ENCOUNTER — Ambulatory Visit: Payer: No Typology Code available for payment source

## 2020-12-22 ENCOUNTER — Other Ambulatory Visit: Payer: No Typology Code available for payment source

## 2020-12-22 ENCOUNTER — Ambulatory Visit: Payer: No Typology Code available for payment source | Admitting: Hematology and Oncology

## 2020-12-23 ENCOUNTER — Ambulatory Visit: Payer: No Typology Code available for payment source

## 2020-12-30 ENCOUNTER — Other Ambulatory Visit: Payer: Self-pay

## 2020-12-30 ENCOUNTER — Encounter: Payer: Self-pay | Admitting: Hematology and Oncology

## 2020-12-30 ENCOUNTER — Inpatient Hospital Stay: Payer: No Typology Code available for payment source

## 2020-12-30 ENCOUNTER — Inpatient Hospital Stay (HOSPITAL_BASED_OUTPATIENT_CLINIC_OR_DEPARTMENT_OTHER): Payer: No Typology Code available for payment source | Admitting: Hematology and Oncology

## 2020-12-30 ENCOUNTER — Inpatient Hospital Stay: Payer: No Typology Code available for payment source | Attending: Hematology and Oncology

## 2020-12-30 VITALS — BP 142/80 | HR 70 | Temp 98.0°F | Resp 18

## 2020-12-30 DIAGNOSIS — N1831 Chronic kidney disease, stage 3a: Secondary | ICD-10-CM

## 2020-12-30 DIAGNOSIS — Z5111 Encounter for antineoplastic chemotherapy: Secondary | ICD-10-CM | POA: Insufficient documentation

## 2020-12-30 DIAGNOSIS — Z79899 Other long term (current) drug therapy: Secondary | ICD-10-CM | POA: Diagnosis not present

## 2020-12-30 DIAGNOSIS — T451X5A Adverse effect of antineoplastic and immunosuppressive drugs, initial encounter: Secondary | ICD-10-CM | POA: Diagnosis not present

## 2020-12-30 DIAGNOSIS — Z7189 Other specified counseling: Secondary | ICD-10-CM

## 2020-12-30 DIAGNOSIS — D61818 Other pancytopenia: Secondary | ICD-10-CM | POA: Diagnosis not present

## 2020-12-30 DIAGNOSIS — D61811 Other drug-induced pancytopenia: Secondary | ICD-10-CM | POA: Insufficient documentation

## 2020-12-30 DIAGNOSIS — C8238 Follicular lymphoma grade IIIa, lymph nodes of multiple sites: Secondary | ICD-10-CM | POA: Diagnosis not present

## 2020-12-30 DIAGNOSIS — N183 Chronic kidney disease, stage 3 unspecified: Secondary | ICD-10-CM | POA: Insufficient documentation

## 2020-12-30 DIAGNOSIS — M25561 Pain in right knee: Secondary | ICD-10-CM | POA: Diagnosis not present

## 2020-12-30 DIAGNOSIS — Z96651 Presence of right artificial knee joint: Secondary | ICD-10-CM

## 2020-12-30 DIAGNOSIS — Z7901 Long term (current) use of anticoagulants: Secondary | ICD-10-CM | POA: Diagnosis not present

## 2020-12-30 DIAGNOSIS — G8929 Other chronic pain: Secondary | ICD-10-CM

## 2020-12-30 LAB — CMP (CANCER CENTER ONLY)
ALT: 14 U/L (ref 0–44)
AST: 17 U/L (ref 15–41)
Albumin: 3.9 g/dL (ref 3.5–5.0)
Alkaline Phosphatase: 78 U/L (ref 38–126)
Anion gap: 7 (ref 5–15)
BUN: 28 mg/dL — ABNORMAL HIGH (ref 8–23)
CO2: 26 mmol/L (ref 22–32)
Calcium: 9 mg/dL (ref 8.9–10.3)
Chloride: 107 mmol/L (ref 98–111)
Creatinine: 1.12 mg/dL — ABNORMAL HIGH (ref 0.44–1.00)
GFR, Estimated: 51 mL/min — ABNORMAL LOW (ref >60)
Glucose, Bld: 73 mg/dL (ref 70–99)
Potassium: 4.1 mmol/L (ref 3.5–5.1)
Sodium: 140 mmol/L (ref 135–145)
Total Bilirubin: 0.6 mg/dL (ref 0.3–1.2)
Total Protein: 6.3 g/dL — ABNORMAL LOW (ref 6.5–8.1)

## 2020-12-30 LAB — CBC WITH DIFFERENTIAL (CANCER CENTER ONLY)
Abs Immature Granulocytes: 0.01 10*3/uL (ref 0.00–0.07)
Basophils Absolute: 0 10*3/uL (ref 0.0–0.1)
Basophils Relative: 1 %
Eosinophils Absolute: 0.2 10*3/uL (ref 0.0–0.5)
Eosinophils Relative: 5 %
HCT: 36.8 % (ref 36.0–46.0)
Hemoglobin: 12.4 g/dL (ref 12.0–15.0)
Immature Granulocytes: 0 %
Lymphocytes Relative: 9 %
Lymphs Abs: 0.4 10*3/uL — ABNORMAL LOW (ref 0.7–4.0)
MCH: 32.6 pg (ref 26.0–34.0)
MCHC: 33.7 g/dL (ref 30.0–36.0)
MCV: 96.8 fL (ref 80.0–100.0)
Monocytes Absolute: 0.6 10*3/uL (ref 0.1–1.0)
Monocytes Relative: 16 %
Neutro Abs: 2.7 10*3/uL (ref 1.7–7.7)
Neutrophils Relative %: 69 %
Platelet Count: 146 10*3/uL — ABNORMAL LOW (ref 150–400)
RBC: 3.8 MIL/uL — ABNORMAL LOW (ref 3.87–5.11)
RDW: 12.8 % (ref 11.5–15.5)
WBC Count: 3.9 10*3/uL — ABNORMAL LOW (ref 4.0–10.5)
nRBC: 0 % (ref 0.0–0.2)

## 2020-12-30 LAB — URIC ACID: Uric Acid, Serum: 7 mg/dL (ref 2.5–7.1)

## 2020-12-30 MED ORDER — SODIUM CHLORIDE 0.9% FLUSH
10.0000 mL | Freq: Once | INTRAVENOUS | Status: AC
  Administered 2020-12-30: 10 mL
  Filled 2020-12-30: qty 10

## 2020-12-30 MED ORDER — PALONOSETRON HCL INJECTION 0.25 MG/5ML
0.2500 mg | Freq: Once | INTRAVENOUS | Status: AC
  Administered 2020-12-30: 0.25 mg via INTRAVENOUS

## 2020-12-30 MED ORDER — ACETAMINOPHEN 325 MG PO TABS
650.0000 mg | ORAL_TABLET | Freq: Once | ORAL | Status: AC
  Administered 2020-12-30: 650 mg via ORAL

## 2020-12-30 MED ORDER — SODIUM CHLORIDE 0.9% FLUSH
10.0000 mL | INTRAVENOUS | Status: DC | PRN
  Administered 2020-12-30: 10 mL
  Filled 2020-12-30: qty 10

## 2020-12-30 MED ORDER — DIPHENHYDRAMINE HCL 25 MG PO CAPS
ORAL_CAPSULE | ORAL | Status: AC
  Filled 2020-12-30: qty 1

## 2020-12-30 MED ORDER — SODIUM CHLORIDE 0.9 % IV SOLN
Freq: Once | INTRAVENOUS | Status: AC
  Filled 2020-12-30: qty 250

## 2020-12-30 MED ORDER — ACETAMINOPHEN 325 MG PO TABS
ORAL_TABLET | ORAL | Status: AC
  Filled 2020-12-30: qty 2

## 2020-12-30 MED ORDER — SODIUM CHLORIDE 0.9 % IV SOLN
10.0000 mg | Freq: Once | INTRAVENOUS | Status: AC
  Administered 2020-12-30: 10 mg via INTRAVENOUS
  Filled 2020-12-30: qty 10

## 2020-12-30 MED ORDER — DIPHENHYDRAMINE HCL 25 MG PO CAPS
25.0000 mg | ORAL_CAPSULE | Freq: Once | ORAL | Status: AC
  Administered 2020-12-30: 25 mg via ORAL

## 2020-12-30 MED ORDER — HEPARIN SOD (PORK) LOCK FLUSH 100 UNIT/ML IV SOLN
500.0000 [IU] | Freq: Once | INTRAVENOUS | Status: AC | PRN
  Administered 2020-12-30: 500 [IU]
  Filled 2020-12-30: qty 5

## 2020-12-30 MED ORDER — SODIUM CHLORIDE 0.9 % IV SOLN
375.0000 mg/m2 | Freq: Once | INTRAVENOUS | Status: AC
  Administered 2020-12-30: 600 mg via INTRAVENOUS
  Filled 2020-12-30: qty 10

## 2020-12-30 MED ORDER — SODIUM CHLORIDE 0.9 % IV SOLN
60.0000 mg/m2 | Freq: Once | INTRAVENOUS | Status: AC
  Administered 2020-12-30: 100 mg via INTRAVENOUS
  Filled 2020-12-30: qty 4

## 2020-12-30 MED ORDER — PALONOSETRON HCL INJECTION 0.25 MG/5ML
INTRAVENOUS | Status: AC
  Filled 2020-12-30: qty 5

## 2020-12-30 NOTE — Patient Instructions (Signed)
[place] Discharge Instructions for Patients Receiving Chemotherapy  Today you received the following chemotherapy agents: Rituxan, Bendeka   To help prevent nausea and vomiting after your treatment, we encourage you to take your nausea medication as directed.    If you develop nausea and vomiting that is not controlled by your nausea medication, call the clinic.   BELOW ARE SYMPTOMS THAT SHOULD BE REPORTED IMMEDIATELY:  *FEVER GREATER THAN 100.5 F  *CHILLS WITH OR WITHOUT FEVER  NAUSEA AND VOMITING THAT IS NOT CONTROLLED WITH YOUR NAUSEA MEDICATION  *UNUSUAL SHORTNESS OF BREATH  *UNUSUAL BRUISING OR BLEEDING  TENDERNESS IN MOUTH AND THROAT WITH OR WITHOUT PRESENCE OF ULCERS  *URINARY PROBLEMS  *BOWEL PROBLEMS  UNUSUAL RASH Items with * indicate a potential emergency and should be followed up as soon as possible.  Feel free to call the clinic should you have any questions or concerns. The clinic phone number is [phone].  Please show the [place] at check-in to the Emergency Department and triage nurse.

## 2020-12-30 NOTE — Assessment & Plan Note (Signed)
Her last CT scan showed complete response with 3 cycles of Bendamustine and rituximab I plan to continue on reduced dose Bendamustine which she tolerated very well My plan would be to complete 6 cycles before switching her over to maintenance rituximab I plan to order CT imaging next month for further follow-up We can start maintenance rituximab in the future

## 2020-12-30 NOTE — Progress Notes (Signed)
[place] OFFICE PROGRESS NOTE  Patient Care Team: Domenick Bookbinder, MD as PCP - General (Internal Medicine) [name], MD as PCP - Cardiology (Cardiology) [name], MD as Attending Physician (Internal Medicine)  ASSESSMENT & PLAN:  Grade 3a follicular lymphoma of lymph nodes of multiple regions [facility]) Her last CT scan showed complete response with 3 cycles of Bendamustine and rituximab I plan to continue on reduced dose Bendamustine which she tolerated very well My plan would be to complete 6 cycles before switching her over to maintenance rituximab I plan to order CT imaging next month for further follow-up We can start maintenance rituximab in the future  Pancytopenia, acquired ([place]) This is due to her treatment She is not symptomatic Observe closely for now  Chronic kidney disease, stage III (moderate) ([place]) She has chronic kidney disease stage III.  This is stable Continue medical management  Chronic knee pain after total replacement of right knee joint I recommend conservative approach She can use topical lidocaine as needed   Orders Placed This Encounter  Procedures  . CT CHEST ABDOMEN PELVIS W CONTRAST    Standing Status:   Future    Standing Expiration Date:   12/30/2021    Order Specific Question:   Preferred imaging location?    Answer:   [facility]    Order Specific Question:   Radiology Contrast Protocol - do NOT remove file path    Answer:   [email or web address]    All questions were answered. The patient knows to call the clinic with any problems, questions or concerns. The total time spent in the appointment was 20 minutes encounter with patients including review of chart and various tests results, discussions about plan of care and coordination of care plan   [name], MD 12/30/2020 1:06 PM  INTERVAL HISTORY: Please see below for problem oriented charting. She is seen prior  to cycle 6 of treatment She complains of recent flare of acute knee pain She denies recent trauma No infusion reactions No recent exacerbation of congestive heart failure  SUMMARY OF ONCOLOGIC HISTORY: Oncology History  Grade 3a follicular lymphoma of lymph nodes of multiple regions ([place])  01/21/2015 Surgery   She underwent excisional lymph node biopsy that came back follicular lymphoma grade 3   01/21/2015 Pathology Results   Accession: [id number] biopsy confirmed follicular lymphoma   9/52/8413 Imaging   Echocardiogram showed ejection fraction of 55-60%   02/11/2015 Imaging    PET CT scan show possible splenic involvement and diffuse lymphadenopathy throughout   02/11/2015 Bone Marrow Biopsy    bone marrow biopsy was performed and is involved by lymphoma with translocation of igH/BCL2   02/13/2015 Procedure   She had port placement.   02/17/2015 - 06/02/2015 Chemotherapy   She received R-CHOP chemo x 6   02/17/2015 Adverse Reaction   She had mild infusion reaction with cycle 1 of treatment.   04/18/2015 Imaging   PET CT scan showed near complete response to treatment.   04/22/2015 Adverse Reaction   Vincristine dose was reduced by 50% due to neuropathy from cycle 4 onwards   06/25/2015 - 07/06/2015 Hospital Admission   She was hospitalized for recent sepsis/bacteremia and a fib with RVR   07/11/2015 Imaging   repeat PEt scan showed complete response to Rx   07/14/2015 - 06/16/2017 Chemotherapy   She received maintenance Rituximab every 60 days   12/15/2015 Imaging   PET CT showed no evidence of cancer recurrence   06/15/2016  PET scan   No evidence of active lymphoma on skullbase to thigh FDG PET scan. Small LEFT periaortic retroperitoneal lymph nodes without significant metabolic activity ( Deauville 1). No change from prior   03/24/2017 Imaging   1. Borderline enlarged abdominal retroperitoneal lymph nodes, stable. No new adenopathy in the chest, abdomen or pelvis. 2. Aortic  atherosclerosis (ICD10-170.0). 3. Enlarged pulmonary arteries, indicative of pulmonary arterial hypertension.   09/14/2017 PET scan   Stable exam. No evidence of active lymphoma or other acute findings. (Deauville score 1)   08/08/2018 PET scan   1. Evidence of progressive/recurrent disease, as evidenced by new hypermetabolic nodes and subcutaneous nodule/nodes throughout left chest and abdomen. (Deauville 5). 2. Likely physiologic right nasopharyngeal hypermetabolism. Recommend attention on follow-up. 3. Incidental findings, including pulmonary artery enlargement, suggesting pulmonary arterial hypertension, stable left lower lobe pulmonary nodule, and aortic Atherosclerosis (ICD10-I70.0).   08/29/2018 Initial Biopsy   Soft tissue simple excision left back: Follicular lymphoma grade 1-2 positive for CD20, PAX5, bcl-6, and bcl-2.  Ki-67 30%   09/11/2018 - 02/20/2019 Chemotherapy   The patient had Bendamustine and Rituximab   12/13/2018 Imaging   1. Response to therapy, as evidenced by resolution of left chest wall nodularity and decrease in size of small left axillary nodes. 2. No residual soft tissue thickening at the site of hypermetabolism along the posterior left eleventh rib. Resolution of adjacent subcutaneous nodularity. 3. No new or progressive disease identified. 4.  Aortic Atherosclerosis (ICD10-I70.0). 5. Bilateral pulmonary nodules, similar. 6. Trace air within the urinary bladder could be iatrogenic. Possible pericystic edema. Correlate with symptoms of cystitis and recent instrumentation.   07/30/2019 Imaging   Ct chest, abdomen and pelvis No findings suspicious for active lymphoma in the chest, abdomen, or pelvis.   Small bilateral pulmonary nodules, unchanged, benign.   Again noted is trace nondependent gas within the bladder. Correlate for recent intervention.   07/09/2020 Imaging   MRI imaging performed [facility] on the left shoulder reviewed significant  lymphadenopathy, worrisome for recurrent lymphoma   07/23/2020 Imaging   1. Recurrent lymphoma as evidenced by enlarging left pectoral and abdominal retroperitoneal lymph nodes. Small left supraclavicular lymph nodes are new. 2. Avascular necrosis in the right femoral head with secondary degenerative osteoarthritis in the right hip. 3.  Aortic atherosclerosis (ICD10-I70.0). 4. Enlarged pulmonic trunk, indicative of pulmonary arterial hypertension.     08/04/2020 -  Chemotherapy   The patient had Bendamustine and Rituxan   10/24/2020 Imaging   1. Resolution of left supraclavicular and left axillary adenopathy. No residual measurable disease suggesting an excellent response to treatment. 2. Significant interval improvement in retroperitoneal lymph nodes. No new or progressive findings. 3. Stable mild emphysematous changes. 4. Enlarged pulmonary arteries suggesting pulmonary hypertension. 5. Stable small low-attenuation splenic lesion, likely benign cyst. 6. Emphysema and aortic atherosclerosis.     REVIEW OF SYSTEMS:   Constitutional: Denies fevers, chills or abnormal weight loss Eyes: Denies blurriness of vision Ears, nose, mouth, throat, and face: Denies mucositis or sore throat Respiratory: Denies cough, dyspnea or wheezes Cardiovascular: Denies palpitation, chest discomfort or lower extremity swelling Gastrointestinal:  Denies nausea, heartburn or change in bowel habits Skin: Denies abnormal skin rashes Lymphatics: Denies new lymphadenopathy or easy bruising Neurological:Denies numbness, tingling or new weaknesses Behavioral/Psych: Mood is stable, no new changes  All other systems were reviewed with the patient and are negative.  I have reviewed the past medical history, past surgical history, social history and family history with the patient  and they are unchanged from previous note.  ALLERGIES:  is allergic to amiodarone.  MEDICATIONS:  Current Outpatient Medications   Medication Sig Dispense Refill  . acetaminophen (TYLENOL) 500 MG tablet Take 500 mg by mouth every 6 (six) hours as needed for moderate pain or headache.    Marland Kitchen acyclovir (ZOVIRAX) 400 MG tablet Take 1 tablet (400 mg total) by mouth daily. 30 tablet 3  . apixaban (ELIQUIS) 5 MG TABS tablet Take 5 mg by mouth 2 (two) times daily.     . bisacodyl (DULCOLAX) 5 MG EC tablet Take 5 mg by mouth daily as needed for moderate constipation.    . diclofenac sodium (VOLTAREN) 1 % GEL Apply 2 g topically 3 (three) times daily as needed (knee pain). 100 g 0  . levothyroxine (SYNTHROID, LEVOTHROID) 112 MCG tablet Take 112 mcg by mouth daily.   9  . lidocaine-prilocaine (EMLA) cream Apply to affected area once 30 g 3  . metoprolol succinate (TOPROL-XL) 25 MG 24 hr tablet Take 0.5 tablets (12.5 mg total) by mouth daily. Take with or immediately following a meal. 90 tablet 3  . ondansetron (ZOFRAN) 8 MG tablet Take 1 tablet (8 mg total) by mouth every 8 (eight) hours as needed for refractory nausea / vomiting. Start on day 2 after bendamustine chemo. 30 tablet 1  . polyvinyl alcohol (LIQUIFILM TEARS) 1.4 % ophthalmic solution Place 1 drop into both eyes as needed for dry eyes.    Marland Kitchen prochlorperazine (COMPAZINE) 10 MG tablet Take 1 tablet (10 mg total) by mouth every 6 (six) hours as needed (Nausea or vomiting). 30 tablet 1   No current facility-administered medications for this visit.   Facility-Administered Medications Ordered in Other Visits  Medication Dose Route Frequency Provider Last Rate Last Admin  . bendamustine (BENDEKA) 100 mg in sodium chloride 0.9 % 50 mL (1.8519 mg/mL) chemo infusion  60 mg/m2 (Treatment Plan Recorded) Intravenous Once Alvy Bimler, Ivanna Kocak, MD      . heparin lock flush 100 unit/mL  500 Units Intracatheter Once PRN Alvy Bimler, Korrie Hofbauer, MD      . ondansetron (ZOFRAN) 8 mg in sodium chloride 0.9 % 50 mL IVPB   Intravenous Once Kashmir Lysaght, MD      . sodium chloride 0.9 % injection 10 mL  10 mL  Intravenous PRN Alvy Bimler, Kerby Borner, MD   10 mL at 04/05/17 1315  . sodium chloride flush (NS) 0.9 % injection 10 mL  10 mL Intracatheter Once Alvy Bimler, Tinea Nobile, MD      . sodium chloride flush (NS) 0.9 % injection 10 mL  10 mL Intracatheter PRN Alvy Bimler, Kareena Arrambide, MD        PHYSICAL EXAMINATION: ECOG PERFORMANCE STATUS: 1 - Symptomatic but completely ambulatory GENERAL:alert, no distress and comfortable SKIN: skin color, texture, turgor are normal, no rashes or significant lesions EYES: normal, Conjunctiva are pink and non-injected, sclera clear OROPHARYNX:no exudate, no erythema and lips, buccal mucosa, and tongue normal  NECK: supple, thyroid normal size, non-tender, without nodularity LYMPH:  no palpable lymphadenopathy in the cervical, axillary or inguinal LUNGS: clear to auscultation and percussion with normal breathing effort HEART: regular rate & rhythm and no murmurs and no lower extremity edema ABDOMEN:abdomen soft, non-tender and normal bowel sounds Musculoskeletal:no cyanosis of digits and no clubbing  NEURO: alert & oriented x 3 with fluent speech, no focal motor/sensory deficits  LABORATORY DATA:  I have reviewed the data as listed    Component Value Date/Time   NA 140 12/30/2020 1047  NA 143 07/16/2019 1132   NA 142 08/29/2017 0838   K 4.1 12/30/2020 1047   K 4.2 08/29/2017 0838   CL 107 12/30/2020 1047   CO2 26 12/30/2020 1047   CO2 23 08/29/2017 0838   GLUCOSE 73 12/30/2020 1047   GLUCOSE 97 08/29/2017 0838   BUN 28 (H) 12/30/2020 1047   BUN 27 07/16/2019 1132   BUN 24.0 08/29/2017 0838   CREATININE 1.12 (H) 12/30/2020 1047   CREATININE 1.0 08/29/2017 0838   CALCIUM 9.0 12/30/2020 1047   CALCIUM 8.9 08/29/2017 0838   PROT 6.3 (L) 12/30/2020 1047   PROT 6.4 08/29/2017 0838   ALBUMIN 3.9 12/30/2020 1047   ALBUMIN 3.8 08/29/2017 0838   AST 17 12/30/2020 1047   AST 18 08/29/2017 0838   ALT 14 12/30/2020 1047   ALT 16 08/29/2017 0838   ALKPHOS 78 12/30/2020 1047   ALKPHOS 93  08/29/2017 0838   BILITOT 0.6 12/30/2020 1047   BILITOT 0.33 08/29/2017 0838   GFRNONAA 51 (L) 12/30/2020 1047   GFRAA 58 (L) 05/26/2020 1103   GFRAA 39 (L) 07/30/2019 1010    No results found for: SPEP, UPEP  Lab Results  Component Value Date   WBC 3.9 (L) 12/30/2020   NEUTROABS 2.7 12/30/2020   HGB 12.4 12/30/2020   HCT 36.8 12/30/2020   MCV 96.8 12/30/2020   PLT 146 (L) 12/30/2020      Chemistry      Component Value Date/Time   NA 140 12/30/2020 1047   NA 143 07/16/2019 1132   NA 142 08/29/2017 0838   K 4.1 12/30/2020 1047   K 4.2 08/29/2017 0838   CL 107 12/30/2020 1047   CO2 26 12/30/2020 1047   CO2 23 08/29/2017 0838   BUN 28 (H) 12/30/2020 1047   BUN 27 07/16/2019 1132   BUN 24.0 08/29/2017 0838   CREATININE 1.12 (H) 12/30/2020 1047   CREATININE 1.0 08/29/2017 0838      Component Value Date/Time   CALCIUM 9.0 12/30/2020 1047   CALCIUM 8.9 08/29/2017 0838   ALKPHOS 78 12/30/2020 1047   ALKPHOS 93 08/29/2017 0838   AST 17 12/30/2020 1047   AST 18 08/29/2017 0838   ALT 14 12/30/2020 1047   ALT 16 08/29/2017 0838   BILITOT 0.6 12/30/2020 1047   BILITOT 0.33 08/29/2017 4536

## 2020-12-30 NOTE — Assessment & Plan Note (Signed)
She has chronic kidney disease stage III.  This is stable Continue medical management

## 2020-12-30 NOTE — Assessment & Plan Note (Signed)
This is due to her treatment She is not symptomatic Observe closely for now

## 2020-12-30 NOTE — Assessment & Plan Note (Signed)
I recommend conservative approach She can use topical lidocaine as needed

## 2020-12-31 ENCOUNTER — Inpatient Hospital Stay: Payer: No Typology Code available for payment source

## 2020-12-31 ENCOUNTER — Other Ambulatory Visit: Payer: Self-pay

## 2020-12-31 VITALS — BP 134/76 | HR 65 | Temp 98.0°F | Resp 18 | Wt 144.8 lb

## 2020-12-31 DIAGNOSIS — Z7189 Other specified counseling: Secondary | ICD-10-CM

## 2020-12-31 DIAGNOSIS — Z5111 Encounter for antineoplastic chemotherapy: Secondary | ICD-10-CM | POA: Diagnosis not present

## 2020-12-31 DIAGNOSIS — C8238 Follicular lymphoma grade IIIa, lymph nodes of multiple sites: Secondary | ICD-10-CM

## 2020-12-31 MED ORDER — SODIUM CHLORIDE 0.9 % IV SOLN
60.0000 mg/m2 | Freq: Once | INTRAVENOUS | Status: AC
  Administered 2020-12-31: 100 mg via INTRAVENOUS
  Filled 2020-12-31: qty 4

## 2020-12-31 MED ORDER — SODIUM CHLORIDE 0.9 % IV SOLN
Freq: Once | INTRAVENOUS | Status: AC
  Filled 2020-12-31: qty 250

## 2020-12-31 MED ORDER — HEPARIN SOD (PORK) LOCK FLUSH 100 UNIT/ML IV SOLN
500.0000 [IU] | Freq: Once | INTRAVENOUS | Status: AC | PRN
  Administered 2020-12-31: 500 [IU]
  Filled 2020-12-31: qty 5

## 2020-12-31 MED ORDER — SODIUM CHLORIDE 0.9 % IV SOLN
10.0000 mg | Freq: Once | INTRAVENOUS | Status: AC
  Administered 2020-12-31: 10 mg via INTRAVENOUS
  Filled 2020-12-31: qty 10

## 2020-12-31 MED ORDER — SODIUM CHLORIDE 0.9% FLUSH
10.0000 mL | INTRAVENOUS | Status: DC | PRN
  Administered 2020-12-31: 10 mL
  Filled 2020-12-31: qty 10

## 2020-12-31 NOTE — Patient Instructions (Signed)
[facility] Discharge Instructions for Patients Receiving Chemotherapy  Today you received the following chemotherapy agents Bendeka.  To help prevent nausea and vomiting after your treatment, we encourage you to take your nausea medication as directed.   If you develop nausea and vomiting that is not controlled by your nausea medication, call the clinic.   BELOW ARE SYMPTOMS THAT SHOULD BE REPORTED IMMEDIATELY:  *FEVER GREATER THAN 100.5 F  *CHILLS WITH OR WITHOUT FEVER  NAUSEA AND VOMITING THAT IS NOT CONTROLLED WITH YOUR NAUSEA MEDICATION  *UNUSUAL SHORTNESS OF BREATH  *UNUSUAL BRUISING OR BLEEDING  TENDERNESS IN MOUTH AND THROAT WITH OR WITHOUT PRESENCE OF ULCERS  *URINARY PROBLEMS  *BOWEL PROBLEMS  UNUSUAL RASH Items with * indicate a potential emergency and should be followed up as soon as possible.  Feel free to call the clinic should you have any questions or concerns. The clinic phone number is [phone].  Please show the CHEMO ALERT CARD at check-in to the Emergency Department and triage nurse.   

## 2021-01-28 ENCOUNTER — Telehealth: Payer: Self-pay | Admitting: *Deleted

## 2021-01-28 NOTE — Telephone Encounter (Signed)
   [facility] Pre-operative Risk Assessment    Patient Name: Carrie [name]  DOB: 11/19/1940  MRN: [id number]   HEARTCARE STAFF: - Please ensure there is not already an duplicate clearance open for this procedure. - Under Visit Info/Reason for Call, type in Other and utilize the format Clearance MM/DD/YY or Clearance TBD. Do not use dashes or single digits. - If request is for dental extraction, please clarify the # of teeth to be extracted.  Request for surgical clearance:  1. What type of surgery is being performed? LEFT REVERSE TOTAL SHOULDER    2. When is this surgery scheduled? TBD   3. What type of clearance is required (medical clearance vs. Pharmacy clearance to hold med vs. Both)? BOTH  4. Are there any medications that need to be held prior to surgery and how long? ELIQUIS   5. Practice name and name of physician performing surgery? [facility]; DR. Remo Lipps [facility]   6. What is the office phone number? [phone]   7.   What is the office fax number? [phone] ATTN: ASHLEY HILTON  8.   Anesthesia type (None, local, MAC, general) ? GENERAL   Julaine Hua 01/28/2021, 4:36 PM  _________________________________________________________________   (provider comments below)

## 2021-01-29 NOTE — Telephone Encounter (Signed)
Left message for the pt to call the office for a sooner pre op appt.

## 2021-01-29 NOTE — Telephone Encounter (Signed)
   Name: Carrie [name]  DOB: [birth year]  MRN: [id number]  Primary Cardiologist: Ena Dawley, MD  Chart reviewed as part of pre-operative protocol coverage. Because of 58 M Jauregui's past medical history and time since last visit, she will require a follow-up visit in order to better assess preoperative cardiovascular risk.  Pre-op covering staff: - Please schedule appointment and call patient to inform them. If patient already had an upcoming appointment within acceptable timeframe, please add "pre-op clearance" to the appointment notes so provider is aware. - Please contact requesting surgeon's office via preferred method (i.e, phone, fax) to inform them of need for appointment prior to surgery.  If applicable, this message will also be routed to pharmacy pool and/or primary cardiologist for input on holding anticoagulant/antiplatelet agent as requested below so that this information is available to the clearing provider at time of patient's appointment.   Patient has not been seen in cardiac office in 1 year, needs pre-op cardiac appointment. Appears to have apt with Dr. Johney Frame, will try and get earlier apt. Will route to pre-op team to re-schedule.  Giancarlos Berendt Ninfa Meeker, PA-C  01/29/2021, 12:35 PM

## 2021-01-29 NOTE — Telephone Encounter (Signed)
Pt has been scheduled sooner for a pre op appt. Carrie [name], [facility] 02/03/21 @ 11:45 am. Pt is grateful for the call and the help. I will send notes to PA for appt and FYI to requesting office pt has appt.

## 2021-01-29 NOTE — Telephone Encounter (Signed)
Sooner appt with Dr. Johney Frame or APP for pre op

## 2021-01-29 NOTE — Telephone Encounter (Signed)
Please comment on Eliquis. Thanks! 

## 2021-02-02 ENCOUNTER — Inpatient Hospital Stay: Payer: Medicare HMO | Attending: Hematology and Oncology

## 2021-02-02 ENCOUNTER — Ambulatory Visit (HOSPITAL_COMMUNITY)
Admission: RE | Admit: 2021-02-02 | Discharge: 2021-02-02 | Disposition: A | Discharge status: Home / Self Care | Payer: No Typology Code available for payment source | Source: Ambulatory Visit | Attending: Hematology and Oncology | Admitting: Hematology and Oncology

## 2021-02-02 ENCOUNTER — Inpatient Hospital Stay: Payer: Medicare HMO

## 2021-02-02 ENCOUNTER — Encounter (HOSPITAL_COMMUNITY): Payer: Self-pay

## 2021-02-02 ENCOUNTER — Other Ambulatory Visit: Payer: Self-pay

## 2021-02-02 DIAGNOSIS — N183 Chronic kidney disease, stage 3 unspecified: Secondary | ICD-10-CM | POA: Insufficient documentation

## 2021-02-02 DIAGNOSIS — Z791 Long term (current) use of non-steroidal anti-inflammatories (NSAID): Secondary | ICD-10-CM | POA: Insufficient documentation

## 2021-02-02 DIAGNOSIS — Z9221 Personal history of antineoplastic chemotherapy: Secondary | ICD-10-CM | POA: Diagnosis not present

## 2021-02-02 DIAGNOSIS — C8238 Follicular lymphoma grade IIIa, lymph nodes of multiple sites: Secondary | ICD-10-CM

## 2021-02-02 DIAGNOSIS — M25512 Pain in left shoulder: Secondary | ICD-10-CM | POA: Diagnosis not present

## 2021-02-02 DIAGNOSIS — D61818 Other pancytopenia: Secondary | ICD-10-CM | POA: Insufficient documentation

## 2021-02-02 DIAGNOSIS — G8929 Other chronic pain: Secondary | ICD-10-CM | POA: Diagnosis not present

## 2021-02-02 DIAGNOSIS — I700 Atherosclerosis of aorta: Secondary | ICD-10-CM | POA: Diagnosis not present

## 2021-02-02 DIAGNOSIS — Z7901 Long term (current) use of anticoagulants: Secondary | ICD-10-CM | POA: Insufficient documentation

## 2021-02-02 DIAGNOSIS — D539 Nutritional anemia, unspecified: Secondary | ICD-10-CM | POA: Insufficient documentation

## 2021-02-02 DIAGNOSIS — Z79899 Other long term (current) drug therapy: Secondary | ICD-10-CM | POA: Diagnosis not present

## 2021-02-02 DIAGNOSIS — Z7189 Other specified counseling: Secondary | ICD-10-CM

## 2021-02-02 LAB — CBC WITH DIFFERENTIAL (CANCER CENTER ONLY)
Abs Immature Granulocytes: 0.01 10*3/uL (ref 0.00–0.07)
Basophils Absolute: 0 10*3/uL (ref 0.0–0.1)
Basophils Relative: 0 %
Eosinophils Absolute: 0.2 10*3/uL (ref 0.0–0.5)
Eosinophils Relative: 5 %
HCT: 35.7 % — ABNORMAL LOW (ref 36.0–46.0)
Hemoglobin: 12.1 g/dL (ref 12.0–15.0)
Immature Granulocytes: 0 %
Lymphocytes Relative: 6 %
Lymphs Abs: 0.2 10*3/uL — ABNORMAL LOW (ref 0.7–4.0)
MCH: 32.3 pg (ref 26.0–34.0)
MCHC: 33.9 g/dL (ref 30.0–36.0)
MCV: 95.2 fL (ref 80.0–100.0)
Monocytes Absolute: 0.5 10*3/uL (ref 0.1–1.0)
Monocytes Relative: 15 %
Neutro Abs: 2.6 10*3/uL (ref 1.7–7.7)
Neutrophils Relative %: 74 %
Platelet Count: 132 10*3/uL — ABNORMAL LOW (ref 150–400)
RBC: 3.75 MIL/uL — ABNORMAL LOW (ref 3.87–5.11)
RDW: 12.2 % (ref 11.5–15.5)
WBC Count: 3.5 10*3/uL — ABNORMAL LOW (ref 4.0–10.5)
nRBC: 0 % (ref 0.0–0.2)

## 2021-02-02 LAB — CMP (CANCER CENTER ONLY)
ALT: 10 U/L (ref 0–44)
AST: 17 U/L (ref 15–41)
Albumin: 3.8 g/dL (ref 3.5–5.0)
Alkaline Phosphatase: 77 U/L (ref 38–126)
Anion gap: 12 (ref 5–15)
BUN: 28 mg/dL — ABNORMAL HIGH (ref 8–23)
CO2: 24 mmol/L (ref 22–32)
Calcium: 8.9 mg/dL (ref 8.9–10.3)
Chloride: 106 mmol/L (ref 98–111)
Creatinine: 1.21 mg/dL — ABNORMAL HIGH (ref 0.44–1.00)
GFR, Estimated: 46 mL/min — ABNORMAL LOW (ref >60)
Glucose, Bld: 90 mg/dL (ref 70–99)
Potassium: 3.9 mmol/L (ref 3.5–5.1)
Sodium: 142 mmol/L (ref 135–145)
Total Bilirubin: 0.6 mg/dL (ref 0.3–1.2)
Total Protein: 5.9 g/dL — ABNORMAL LOW (ref 6.5–8.1)

## 2021-02-02 LAB — URIC ACID: Uric Acid, Serum: 7.2 mg/dL — ABNORMAL HIGH (ref 2.5–7.1)

## 2021-02-02 MED ORDER — IOHEXOL 300 MG/ML  SOLN
100.0000 mL | Freq: Once | INTRAMUSCULAR | Status: AC | PRN
  Administered 2021-02-02: 80 mL via INTRAVENOUS

## 2021-02-02 MED ORDER — HEPARIN SOD (PORK) LOCK FLUSH 100 UNIT/ML IV SOLN
INTRAVENOUS | Status: AC
  Filled 2021-02-02: qty 5

## 2021-02-02 MED ORDER — SODIUM CHLORIDE 0.9% FLUSH
10.0000 mL | Freq: Once | INTRAVENOUS | Status: DC
  Filled 2021-02-02: qty 10

## 2021-02-02 MED ORDER — HEPARIN SOD (PORK) LOCK FLUSH 100 UNIT/ML IV SOLN
500.0000 [IU] | Freq: Once | INTRAVENOUS | Status: AC
  Administered 2021-02-02: 500 [IU] via INTRAVENOUS

## 2021-02-02 NOTE — Progress Notes (Signed)
Cardiology Office Note    Date:  02/03/2021   ID:  Carrie [name], Carrie [name] 11-Dec-1940, MRN [id number]   PCP:  Domenick Bookbinder, [place]  Cardiologist:  Ena Dawley, MD  Advanced Practice Provider:  No care team member to display Electrophysiologist:  None   [phone]   Chief Complaint  Patient presents with  . Pre-op Exam    History of Present Illness:  Carrie [name] is a 80 y.o. female with history of hypertension, PAF status post DCCV 05/22/4626 on Eliquis, follicular lymphoma, systolic CHF question secondary to tachycardia mediated from A. fib versus chemo induced LVEF 20 to 25% 02/2019, follow-up echo 05/2019 LVEF 55 to 60%, Entresto stopped because of worsening renal function syncope and orthostasis  Patient is here today for preoperative clearance before undergoing left reverse total shoulder surgery by Dr. Earlie Counts.  She denies chest pain, palpitations, dyspnea, dizziness or edema. Just finished 6 months of chemo in March and now with shoulder pain she has been unable to exercise. BP up today but ok at home 128/78 this am.  Past Medical History:  Diagnosis Date  . A-fib ([place])   . Anemia    "years ago"  . Atrial fibrillation with rapid ventricular response ([place]) 06/25/2015  . Carpal tunnel syndrome, bilateral   . Cervical spondylosis without myelopathy 10/25/2013  . Dental bridge present   . DJD (degenerative joint disease)   . Dysrhythmia    WENT INTO A FIB IN 2017   . Elevated troponin 02/18/2015  . Follicular lymphoma grade 3a ([place]) 02/03/2015  . History of echocardiogram    Echo 12/16: EF 60-65%, no RWMA, severe LAE  . History of nuclear stress test    Myoview 1/17: EF 55%, Normal study. No ischemia or scar.  . Hypothyroid   . Nausea without vomiting 06/20/2015  . Pneumonia   . Stage III chronic kidney disease ([place]) 07/01/2015  . Thrush of mouth and esophagus ([place]) 06/25/2015    Past Surgical History:  Procedure Laterality Date   . ABDOMINAL HYSTERECTOMY    . APPENDECTOMY    . BACK SURGERY     X5-lumbar-fusion  . COLONOSCOPY    . DILATION AND CURETTAGE OF UTERUS    . LYMPH NODE BIOPSY Right 01/21/2015   Procedure: RIGHT GROIN LYMPH NODE BIOPSY;  Surgeon: Erroll Luna, MD;  Location: [facility];  Service: General;  Laterality: Right;  . MASS EXCISION Left 08/29/2018   Procedure: EXCISION LEFT BACK  MASS;  Surgeon: Erroll Luna, MD;  Location: [facility];  Service: General;  Laterality: Left;  . Ovarian cyst resection    . patelar tendon transplants     Left/right  . PORTACATH PLACEMENT Right 02/13/2015   Procedure: INSERTION PORT-A-CATH WITH ULTRASOUND;  Surgeon: Erroll Luna, MD;  Location: [facility];  Service: General;  Laterality: Right;  . PORTACATH PLACEMENT N/A 08/29/2018   Procedure: INSERTION PORT-A-CATH WITH ULTRA SOUND ERAS PATHWAY;  Surgeon: Erroll Luna, MD;  Location: [facility];  Service: General;  Laterality: N/A;  . TOTAL KNEE ARTHROPLASTY  2011   Right  . TUBAL LIGATION      Current Medications: Current Meds  Medication Sig  . acetaminophen (TYLENOL) 500 MG tablet Take 500 mg by mouth every 6 (six) hours as needed for moderate pain or headache.  Marland Kitchen acyclovir (ZOVIRAX) 400 MG tablet Take 1 tablet (400 mg total) by mouth daily.  Marland Kitchen allopurinol (ZYLOPRIM) 300 MG tablet Take 1 tablet  by mouth daily.  Marland Kitchen apixaban (ELIQUIS) 5 MG TABS tablet Take 5 mg by mouth 2 (two) times daily.   . bisacodyl (DULCOLAX) 5 MG EC tablet Take 5 mg by mouth daily as needed for moderate constipation.  . diclofenac sodium (VOLTAREN) 1 % GEL Apply 2 g topically 3 (three) times daily as needed (knee pain).  Marland Kitchen levothyroxine (SYNTHROID, LEVOTHROID) 112 MCG tablet Take 112 mcg by mouth daily.   Marland Kitchen lidocaine-prilocaine (EMLA) cream Apply to affected area once  . metoprolol succinate (TOPROL-XL) 25 MG 24 hr tablet Take 0.5 tablets (12.5 mg total) by mouth daily. Take with or immediately following a meal.  . ondansetron  (ZOFRAN) 8 MG tablet Take 1 tablet (8 mg total) by mouth every 8 (eight) hours as needed for refractory nausea / vomiting. Start on day 2 after bendamustine chemo.  . polyvinyl alcohol (LIQUIFILM TEARS) 1.4 % ophthalmic solution Place 1 drop into both eyes as needed for dry eyes.  Marland Kitchen prochlorperazine (COMPAZINE) 10 MG tablet Take 1 tablet (10 mg total) by mouth every 6 (six) hours as needed (Nausea or vomiting).     Allergies:   Amiodarone   Social History   Socioeconomic History  . Marital status: Widowed    Spouse name: Not on file  . Number of children: 2  . Years of education: hs  . Highest education level: Not on file  Occupational History  . Occupation: Retired  Tobacco Use  . Smoking status: Never Smoker  . Smokeless tobacco: Never Used  Vaping Use  . Vaping Use: Never used  Substance and Sexual Activity  . Alcohol use: Yes    Comment: occassional wine  . Drug use: No  . Sexual activity: Not on file  Other Topics Concern  . Not on file  Social History Narrative   Lives alone.  Has a walker for home use.   Social Determinants of Health   Financial Resource Strain: Not on file  Food Insecurity: Not on file  Transportation Needs: Not on file  Physical Activity: Not on file  Stress: Not on file  Social Connections: Not on file     Family History:  The patient's family history includes Cancer in her mother and sister.   ROS:   Please see the history of present illness.    ROS All other systems reviewed and are negative.   PHYSICAL EXAM:   VS:  BP 130/90   Pulse 71   Ht 5\' 1"  (1.549 m)   Wt 142 lb 3.2 oz (64.5 kg)   SpO2 97%   BMI 26.87 kg/m   Physical Exam  GEN: Well nourished, well developed, in no acute distress  HEENT: normal  Neck: no JVD, carotid bruits, or masses Cardiac:RRR; no murmurs, rubs, or gallops  Respiratory:  clear to auscultation bilaterally, normal work of breathing GI: soft, nontender, nondistended, + BS Ext: without cyanosis,  clubbing, or edema, Good distal pulses bilaterally MS: no deformity or atrophy  Skin: warm and dry, no rash Neuro:  Alert and Oriented x 3, Strength and sensation are intact Psych: euthymic mood, full affect  Wt Readings from Last 3 Encounters:  02/03/21 142 lb 3.2 oz (64.5 kg)  02/03/21 143 lb 3.2 oz (65 kg)  12/31/20 144 lb 12 oz (65.7 kg)      Studies/Labs Reviewed:   EKG:  EKG is  ordered today.  The ekg ordered today demonstrates NSR with inf, ant, lat TWI improved from last EKG.  Recent Labs: 02/02/2021:  ALT 10; BUN 28; Creatinine 1.21; Hemoglobin 12.1; Platelet Count 132; Potassium 3.9; Sodium 142   Lipid Panel No results found for: CHOL, TRIG, HDL, CHOLHDL, VLDL, LDLCALC, LDLDIRECT  Additional studies/ records that were reviewed today include:  2D Echo 05/24/19 The left ventricle has normal systolic function, with an ejection fraction of 55-60%. The cavity size was normal. Left ventricular diastolic Doppler parameters are consistent with pseudonormalization. No evidence of left ventricular regional wall motion abnormalities. 2. The right ventricle has normal systolic function. The cavity was normal. There is no increase in right ventricular wall thickness. 3. Left atrial size was mildly dilated. 4. No evidence of mitral valve stenosis. Mild mitral regurgitation. 5. The aortic valve is tricuspid. Aortic valve regurgitation is trivial by color flow Doppler. No stenosis of the aortic valve. 6. The aorta is normal in size and structure. 7. The aortic root is normal in size and structure. 8. The IVC was normal in size. PA systolic pressure 28 mmHg.        Risk Assessment/Calculations:    CHA2DS2-VASc Score = 6  This indicates a 9.7% annual risk of stroke. The patient's score is based upon: CHF History: Yes HTN History: Yes Diabetes History: No Stroke History: No Vascular Disease History: Yes Age Score: 2 Gender Score: 1        ASSESSMENT:    1.  Preoperative clearance   2. NICM (nonischemic cardiomyopathy) (HCC)   3. Paroxysmal atrial fibrillation ([place])   4. Renal insufficiency   5. Essential hypertension   6. Grade 3a follicular lymphoma of lymph nodes of multiple regions [facility])      PLAN:  In order of problems listed above:  Preoperative clearance before undergoing reverse total shoulder surgery by Dr. Veverly Fells. Patient without cardiac symptoms but not very active because of recent chemo fatigue, knee and shoulder problems.Marland Kitchen METs  4.4.Eliquis managed by [facility]ologist at [facility] and they will recommend how long she can hold it for sugery. Can proceed with surgery without further cardiac workup.   According to the Revised Cardiac Risk Index (RCRI), her Perioperative Risk of Major Cardiac Event is (%): 0.9  Her Functional Capacity in METs is: 4.4 according to the [facility] Activity Status Index (DASI).    Nonischemic cardiomyopathy ejection fraction 25% improved to 55 to 60% postconversion from A. fib to normal sinus rhythm.  Last echo 05/2019 normal LVEF. No symptoms  PAF status post DCCV 02/2019 on Eliquis and metoprolol  Renal insufficiency on Entresto plan to avoid ACE/ARB in the future  Hypertension BP up today but in pain and rushed in her. Stable at home. No changes  Grade 3 follicular lymphoma treated with chemo-just finished.     Shared Decision Making/Informed Consent        Medication Adjustments/Labs and Tests Ordered: Current medicines are reviewed at length with the patient today.  Concerns regarding medicines are outlined above.  Medication changes, Labs and Tests ordered today are listed in the Patient Instructions below. Patient Instructions  Medication Instructions:  Your physician recommends that you continue on your current medications as directed. Please refer to the Current Medication list given to you today.  *If you need a refill on your cardiac medications before your next appointment, please call your  pharmacy*   Lab Work: None If you have labs (blood work) drawn today and your tests are completely normal, you will receive your results only by: Marland Kitchen MyChart Message (if you have MyChart) OR . A paper copy in the mail If  you have any lab test that is abnormal or we need to change your treatment, we will call you to review the results.   Follow-Up: At [facility], you and your health needs are our priority.  As part of our continuing mission to provide you with exceptional heart care, we have created designated Provider Care Teams.  These Care Teams include your primary Cardiologist (physician) and Advanced Practice Providers (APPs -  Physician Assistants and Nurse Practitioners) who all work together to provide you with the care you need, when you need it.  Your next appointment:   6 month(s)  The format for your next appointment:   In Person  Provider:   Gwyndolyn Kaufman, MD     Signed, Ermalinda Barrios, PA-C  02/03/2021 11:58 AM    [address], [place], [place]  [address] Phone: [phone]; Fax: [phone]

## 2021-02-03 ENCOUNTER — Encounter: Payer: Self-pay | Admitting: Physician Assistant

## 2021-02-03 ENCOUNTER — Encounter: Payer: Self-pay | Admitting: Hematology and Oncology

## 2021-02-03 ENCOUNTER — Ambulatory Visit (INDEPENDENT_AMBULATORY_CARE_PROVIDER_SITE_OTHER): Payer: No Typology Code available for payment source | Admitting: Physician Assistant

## 2021-02-03 ENCOUNTER — Inpatient Hospital Stay (HOSPITAL_BASED_OUTPATIENT_CLINIC_OR_DEPARTMENT_OTHER): Payer: Medicare HMO | Admitting: Hematology and Oncology

## 2021-02-03 VITALS — BP 130/90 | HR 71 | Ht 61.0 in | Wt 142.2 lb

## 2021-02-03 DIAGNOSIS — C8238 Follicular lymphoma grade IIIa, lymph nodes of multiple sites: Secondary | ICD-10-CM | POA: Diagnosis not present

## 2021-02-03 DIAGNOSIS — D539 Nutritional anemia, unspecified: Secondary | ICD-10-CM

## 2021-02-03 DIAGNOSIS — M25512 Pain in left shoulder: Secondary | ICD-10-CM | POA: Diagnosis not present

## 2021-02-03 DIAGNOSIS — I428 Other cardiomyopathies: Secondary | ICD-10-CM

## 2021-02-03 DIAGNOSIS — N1831 Chronic kidney disease, stage 3a: Secondary | ICD-10-CM

## 2021-02-03 DIAGNOSIS — Z01818 Encounter for other preprocedural examination: Secondary | ICD-10-CM | POA: Diagnosis not present

## 2021-02-03 DIAGNOSIS — N289 Disorder of kidney and ureter, unspecified: Secondary | ICD-10-CM | POA: Diagnosis not present

## 2021-02-03 DIAGNOSIS — I480 Paroxysmal atrial fibrillation: Secondary | ICD-10-CM | POA: Diagnosis not present

## 2021-02-03 DIAGNOSIS — I10 Essential (primary) hypertension: Secondary | ICD-10-CM

## 2021-02-03 DIAGNOSIS — G8929 Other chronic pain: Secondary | ICD-10-CM

## 2021-02-03 NOTE — Assessment & Plan Note (Signed)
She has borderline renal dysfunction and slightly elevated uric acid We discussed the importance of hydration

## 2021-02-03 NOTE — Patient Instructions (Signed)
Medication Instructions:  Your physician recommends that you continue on your current medications as directed. Please refer to the Current Medication list given to you today.  *If you need a refill on your cardiac medications before your next appointment, please call your pharmacy*   Lab Work: None If you have labs (blood work) drawn today and your tests are completely normal, you will receive your results only by: Marland Kitchen MyChart Message (if you have MyChart) OR . A paper copy in the mail If you have any lab test that is abnormal or we need to change your treatment, we will call you to review the results.   Follow-Up: At [facility], you and your health needs are our priority.  As part of our continuing mission to provide you with exceptional heart care, we have created designated Provider Care Teams.  These Care Teams include your primary Cardiologist (physician) and Advanced Practice Providers (APPs -  Physician Assistants and Nurse Practitioners) who all work together to provide you with the care you need, when you need it.  Your next appointment:   6 month(s)  The format for your next appointment:   In Person  Provider:   Gwyndolyn Kaufman, MD

## 2021-02-03 NOTE — Progress Notes (Signed)
ON PATHWAY REGIMEN - Lymphoma and CLL  No Change  Continue With Treatment as Ordered.  Original Decision Date/Time: 09/04/2018 16:52     A cycle is every 28 days:     Bendamustine      Rituximab   **Always confirm dose/schedule in your pharmacy ordering system**  Patient Characteristics: Follicular Lymphoma, Grades 1, 2, and 3A, Second Line, Prior Treatment with Rituximab Alone, Relapse < 24 Months Disease Type: Follicular Lymphoma Disease Type: Not Applicable Disease Type: Not Applicable [place] Stage: IV Tumor Grade: 2 Line of Therapy: Second Line Prior Treatment: Prior Treatment with Rituximab Alone Time to Relapse: Relapse < 24 Months Intent of Therapy: Curative Intent, Discussed with Patient

## 2021-02-03 NOTE — Progress Notes (Signed)
Carrie [name] OFFICE PROGRESS NOTE  Patient Care Team: Domenick Bookbinder, MD as PCP - General (Internal Medicine) [name], MD as PCP - Cardiology (Cardiology) [name], MD as Attending Physician (Internal Medicine)  ASSESSMENT & PLAN:  Grade 3a follicular lymphoma of lymph nodes of multiple regions [facility]) She has complete response to treatment CT imaging is completely normal Given her high risk situation, I recommend maintenance rituximab We discussed the risk, benefits, side effects of maintenance rituximab every 60 days and she is in agreement to proceed I will schedule that to start in June The patient desire to have shoulder surgery next month  Chronic left shoulder pain She has severe shoulder pain I offered her pain medicine According to the patient, she has oxycodone to take as needed at home  Deficiency anemia She has very mild pancytopenia likely due to recent treatment I will check a vitamin B12 level in her next visit  Chronic kidney disease, stage III (moderate) ([place]) She has borderline renal dysfunction and slightly elevated uric acid We discussed the importance of hydration   Orders Placed This Encounter  Procedures  . Vitamin B12    Standing Status:   Standing    Number of Occurrences:   1    Standing Expiration Date:   02/03/2022    All questions were answered. The patient knows to call the clinic with any problems, questions or concerns. The total time spent in the appointment was 30 minutes encounter with patients including review of chart and various tests results, discussions about plan of care and coordination of care plan   [name], MD 02/03/2021 12:10 PM  INTERVAL HISTORY: Please see below for problem oriented charting. She returns to review test result She is in obvious discomfort from left shoulder pain She did not take any pain medicine recently No recent infection She informed me she would like to proceed with  shoulder repair next month  SUMMARY OF ONCOLOGIC HISTORY: Oncology History  Grade 3a follicular lymphoma of lymph nodes of multiple regions ([place])  01/21/2015 Surgery   She underwent excisional lymph node biopsy that came back follicular lymphoma grade 3   01/21/2015 Pathology Results   Accession: [id number] biopsy confirmed follicular lymphoma   1/51/7616 Imaging   Echocardiogram showed ejection fraction of 55-60%   02/11/2015 Imaging    PET CT scan show possible splenic involvement and diffuse lymphadenopathy throughout   02/11/2015 Bone Marrow Biopsy    bone marrow biopsy was performed and is involved by lymphoma with translocation of igH/BCL2   02/13/2015 Procedure   She had port placement.   02/17/2015 - 06/02/2015 Chemotherapy   She received R-CHOP chemo x 6   02/17/2015 Adverse Reaction   She had mild infusion reaction with cycle 1 of treatment.   04/18/2015 Imaging   PET CT scan showed near complete response to treatment.   04/22/2015 Adverse Reaction   Vincristine dose was reduced by 50% due to neuropathy from cycle 4 onwards   06/25/2015 - 07/06/2015 Hospital Admission   She was hospitalized for recent sepsis/bacteremia and a fib with RVR   07/11/2015 Imaging   repeat PEt scan showed complete response to Rx   07/14/2015 - 06/16/2017 Chemotherapy   She received maintenance Rituximab every 60 days   12/15/2015 Imaging   PET CT showed no evidence of cancer recurrence   06/15/2016 PET scan   No evidence of active lymphoma on skullbase to thigh FDG PET scan. Small LEFT periaortic retroperitoneal lymph nodes  without significant metabolic activity ( [place] 1). No change from prior   03/24/2017 Imaging   1. Borderline enlarged abdominal retroperitoneal lymph nodes, stable. No new adenopathy in the chest, abdomen or pelvis. 2. Aortic atherosclerosis (ICD10-170.0). 3. Enlarged pulmonary arteries, indicative of pulmonary arterial hypertension.   09/14/2017 PET scan   Stable exam. No  evidence of active lymphoma or other acute findings. (Deauville score 1)   08/08/2018 PET scan   1. Evidence of progressive/recurrent disease, as evidenced by new hypermetabolic nodes and subcutaneous nodule/nodes throughout left chest and abdomen. ([place]ville 5). 2. Likely physiologic right nasopharyngeal hypermetabolism. Recommend attention on follow-up. 3. Incidental findings, including pulmonary artery enlargement, suggesting pulmonary arterial hypertension, stable left lower lobe pulmonary nodule, and aortic Atherosclerosis (ICD10-I70.0).   08/29/2018 Initial Biopsy   Soft tissue simple excision left back: Follicular lymphoma grade 1-2 positive for CD20, PAX5, bcl-6, and bcl-2.  Ki-67 30%   09/11/2018 - 02/20/2019 Chemotherapy   The patient had Bendamustine and Rituximab   12/13/2018 Imaging   1. Response to therapy, as evidenced by resolution of left chest wall nodularity and decrease in size of small left axillary nodes. 2. No residual soft tissue thickening at the site of hypermetabolism along the posterior left eleventh rib. Resolution of adjacent subcutaneous nodularity. 3. No new or progressive disease identified. 4.  Aortic Atherosclerosis (ICD10-I70.0). 5. Bilateral pulmonary nodules, similar. 6. Trace air within the urinary bladder could be iatrogenic. Possible pericystic edema. Correlate with symptoms of cystitis and recent instrumentation.   07/30/2019 Imaging   Ct chest, abdomen and pelvis No findings suspicious for active lymphoma in the chest, abdomen, or pelvis.   Small bilateral pulmonary nodules, unchanged, benign.   Again noted is trace nondependent gas within the bladder. Correlate for recent intervention.   07/09/2020 Imaging   MRI imaging performed at [facility] on the left shoulder reviewed significant lymphadenopathy, worrisome for recurrent lymphoma   07/23/2020 Imaging   1. Recurrent lymphoma as evidenced by enlarging left pectoral and abdominal retroperitoneal  lymph nodes. Small left supraclavicular lymph nodes are new. 2. Avascular necrosis in the right femoral head with secondary degenerative osteoarthritis in the right hip. 3.  Aortic atherosclerosis (ICD10-I70.0). 4. Enlarged pulmonic trunk, indicative of pulmonary arterial hypertension.     08/04/2020 -  Chemotherapy   The patient had Bendamustine and Rituxan   10/24/2020 Imaging   1. Resolution of left supraclavicular and left axillary adenopathy. No residual measurable disease suggesting an excellent response to treatment. 2. Significant interval improvement in retroperitoneal lymph nodes. No new or progressive findings. 3. Stable mild emphysematous changes. 4. Enlarged pulmonary arteries suggesting pulmonary hypertension. 5. Stable small low-attenuation splenic lesion, likely benign cyst. 6. Emphysema and aortic atherosclerosis.   02/02/2021 Imaging   1. Stable examination with unchanged small retroperitoneal lymph nodes. No new or enlarging pathologically enlarged lymph nodes in the chest, abdomen or pelvis. 2. Stable enlargement of the central pulmonary arteries, which can be seen with pulmonary arterial hypertension. 3. Emphysema and aortic atherosclerosis.     03/23/2021 -  Chemotherapy    Patient is on Treatment Plan: NON-HODGKINS LYMPHOMA RITUXIMAB Q60D MAINTENANCE        REVIEW OF SYSTEMS:   Constitutional: Denies fevers, chills or abnormal weight loss Eyes: Denies blurriness of vision Ears, nose, mouth, throat, and face: Denies mucositis or sore throat Respiratory: Denies cough, dyspnea or wheezes Cardiovascular: Denies palpitation, chest discomfort or lower extremity swelling Gastrointestinal:  Denies nausea, heartburn or change in bowel habits Skin: Denies abnormal skin  rashes Lymphatics: Denies new lymphadenopathy or easy bruising Neurological:Denies numbness, tingling or new weaknesses Behavioral/Psych: Mood is stable, no new changes  All other systems were reviewed  with the patient and are negative.  I have reviewed the past medical history, past surgical history, social history and family history with the patient and they are unchanged from previous note.  ALLERGIES:  is allergic to amiodarone.  MEDICATIONS:  Current Outpatient Medications  Medication Sig Dispense Refill  . acetaminophen (TYLENOL) 500 MG tablet Take 500 mg by mouth every 6 (six) hours as needed for moderate pain or headache.    Marland Kitchen acyclovir (ZOVIRAX) 400 MG tablet Take 1 tablet (400 mg total) by mouth daily. 30 tablet 3  . allopurinol (ZYLOPRIM) 300 MG tablet Take 1 tablet by mouth daily.    Marland Kitchen apixaban (ELIQUIS) 5 MG TABS tablet Take 5 mg by mouth 2 (two) times daily.     . bisacodyl (DULCOLAX) 5 MG EC tablet Take 5 mg by mouth daily as needed for moderate constipation.    . diclofenac sodium (VOLTAREN) 1 % GEL Apply 2 g topically 3 (three) times daily as needed (knee pain). 100 g 0  . levothyroxine (SYNTHROID, LEVOTHROID) 112 MCG tablet Take 112 mcg by mouth daily.   9  . lidocaine-prilocaine (EMLA) cream Apply to affected area once 30 g 3  . metoprolol succinate (TOPROL-XL) 25 MG 24 hr tablet Take 0.5 tablets (12.5 mg total) by mouth daily. Take with or immediately following a meal. 90 tablet 3  . ondansetron (ZOFRAN) 8 MG tablet Take 1 tablet (8 mg total) by mouth every 8 (eight) hours as needed for refractory nausea / vomiting. Start on day 2 after bendamustine chemo. 30 tablet 1  . polyvinyl alcohol (LIQUIFILM TEARS) 1.4 % ophthalmic solution Place 1 drop into both eyes as needed for dry eyes.    Marland Kitchen prochlorperazine (COMPAZINE) 10 MG tablet Take 1 tablet (10 mg total) by mouth every 6 (six) hours as needed (Nausea or vomiting). 30 tablet 1   No current facility-administered medications for this visit.   Facility-Administered Medications Ordered in Other Visits  Medication Dose Route Frequency Provider Last Rate Last Admin  . ondansetron (ZOFRAN) 8 mg in sodium chloride 0.9 % 50 mL  IVPB   Intravenous Once Rosalyn Archambault, MD      . sodium chloride 0.9 % injection 10 mL  10 mL Intravenous PRN Alvy Bimler, Wendi Lastra, MD   10 mL at 04/05/17 1315  . sodium chloride flush (NS) 0.9 % injection 10 mL  10 mL Intracatheter Once Alvy Bimler, Bryler Dibble, MD        PHYSICAL EXAMINATION: ECOG PERFORMANCE STATUS: 1 - Symptomatic but completely ambulatory  Vitals:   02/03/21 0935  BP: 138/82  Pulse: (!) 136  Resp: 18  Temp: 97.6 F (36.4 C)  SpO2: 94%   Filed Weights   02/03/21 0935  Weight: 143 lb 3.2 oz (65 kg)    GENERAL:alert, in mild distress due to left shoulder discomfort NEURO: alert & oriented x 3 with fluent speech, no focal motor/sensory deficits  LABORATORY DATA:  I have reviewed the data as listed    Component Value Date/Time   NA 142 02/02/2021 0855   NA 143 07/16/2019 1132   NA 142 08/29/2017 0838   K 3.9 02/02/2021 0855   K 4.2 08/29/2017 0838   CL 106 02/02/2021 0855   CO2 24 02/02/2021 0855   CO2 23 08/29/2017 0838   GLUCOSE 90 02/02/2021 0855   GLUCOSE  97 08/29/2017 0838   BUN 28 (H) 02/02/2021 0855   BUN 27 07/16/2019 1132   BUN 24.0 08/29/2017 0838   CREATININE 1.21 (H) 02/02/2021 0855   CREATININE 1.0 08/29/2017 0838   CALCIUM 8.9 02/02/2021 0855   CALCIUM 8.9 08/29/2017 0838   PROT 5.9 (L) 02/02/2021 0855   PROT 6.4 08/29/2017 0838   ALBUMIN 3.8 02/02/2021 0855   ALBUMIN 3.8 08/29/2017 0838   AST 17 02/02/2021 0855   AST 18 08/29/2017 0838   ALT 10 02/02/2021 0855   ALT 16 08/29/2017 0838   ALKPHOS 77 02/02/2021 0855   ALKPHOS 93 08/29/2017 0838   BILITOT 0.6 02/02/2021 0855   BILITOT 0.33 08/29/2017 0838   GFRNONAA 46 (L) 02/02/2021 0855   GFRAA 58 (L) 05/26/2020 1103   GFRAA 39 (L) 07/30/2019 1010    No results found for: SPEP, UPEP  Lab Results  Component Value Date   WBC 3.5 (L) 02/02/2021   NEUTROABS 2.6 02/02/2021   HGB 12.1 02/02/2021   HCT 35.7 (L) 02/02/2021   MCV 95.2 02/02/2021   PLT 132 (L) 02/02/2021      Chemistry       Component Value Date/Time   NA 142 02/02/2021 0855   NA 143 07/16/2019 1132   NA 142 08/29/2017 0838   K 3.9 02/02/2021 0855   K 4.2 08/29/2017 0838   CL 106 02/02/2021 0855   CO2 24 02/02/2021 0855   CO2 23 08/29/2017 0838   BUN 28 (H) 02/02/2021 0855   BUN 27 07/16/2019 1132   BUN 24.0 08/29/2017 0838   CREATININE 1.21 (H) 02/02/2021 0855   CREATININE 1.0 08/29/2017 0838      Component Value Date/Time   CALCIUM 8.9 02/02/2021 0855   CALCIUM 8.9 08/29/2017 0838   ALKPHOS 77 02/02/2021 0855   ALKPHOS 93 08/29/2017 0838   AST 17 02/02/2021 0855   AST 18 08/29/2017 0838   ALT 10 02/02/2021 0855   ALT 16 08/29/2017 0838   BILITOT 0.6 02/02/2021 0855   BILITOT 0.33 08/29/2017 0838       RADIOGRAPHIC STUDIES: I have personally reviewed the radiological images as listed and agreed with the findings in the report. CT CHEST ABDOMEN PELVIS W CONTRAST  Result Date: 02/02/2021 CLINICAL DATA:  Hematologic malignancy, assess treatment response EXAM: CT CHEST, ABDOMEN, AND PELVIS WITH CONTRAST TECHNIQUE: Multidetector CT imaging of the chest, abdomen and pelvis was performed following the standard protocol during bolus administration of intravenous contrast. CONTRAST:  43m OMNIPAQUE IOHEXOL 300 MG/ML  SOLN COMPARISON:  Multiple priors including most recent CT chest abdomen and pelvis October 24, 2020. FINDINGS: CT CHEST FINDINGS Cardiovascular: Similar mild cardiac enlargement. No significant pericardial effusion/thickening. Aortic atherosclerosis. No thoracic aortic aneurysm. Similar enlargement of the central pulmonary arteries. Accessed right chest wall Port-A-Cath with tip at the superior cavoatrial junction. Mediastinum/Nodes: No mediastinal, hilar or axillary adenopathy. No discrete thyroid nodule. The the trachea and esophagus are grossly unremarkable. Lungs/Pleura: Stable mild emphysematous change. Hypoventilatory change in the dependent lungs. No suspicious pulmonary nodules or masses.  No focal consolidation. No pleural effusion. No pneumothorax. Musculoskeletal: No aggressive lytic or blastic lesion of bone. CT ABDOMEN PELVIS FINDINGS Hepatobiliary: No solid enhancing hepatic lesion. Gallbladder is grossly unremarkable. No biliary ductal dilation. Pancreas: Within normal limits. Spleen: Unchanged size of the 5 mm hypoattenuating splenic lesion likely a cyst. No splenic enlargement or were some splenic lesions. Adrenals/Urinary Tract: Bilateral adrenal glands are unremarkable. No hydronephrosis. No solid enhancing renal masses. Similar  marked cortical irregularity of the bilateral kidneys which may be related to fetal lobulations or sequela prior infection/inflammation with cortical scarring. Urinary bladder is grossly unremarkable for degree of distension. Stomach/Bowel: Stomach, small bowel, and colon are without evidence of bowel wall thickening, distention, or inflammatory changes. Scattered left-sided colonic diverticulosis without findings of acute diverticulitis. Vascular/Lymphatic: Aortic atherosclerosis. No abdominal aortic aneurysm. No new or enlarging suspicious abdominal or pelvic lymph nodes. Index lymph nodes are as follows: -left periaortic lymph node measures 7 mm on image 62/2, unchanged. -left lower periaortic lymph node measures 6 mm on image 70/2, unchanged. -left common iliac lymph node measures 5 mm on image 76/2, unchanged. Reproductive: Surgically absent Other: No abdominopelvic ascites. Musculoskeletal: Stable posterior lumbar fusion hardware. No aggressive lytic or blastic lesion of bone. Stable focus of AVN involving the right hip. IMPRESSION: 1. Stable examination with unchanged small retroperitoneal lymph nodes. No new or enlarging pathologically enlarged lymph nodes in the chest, abdomen or pelvis. 2. Stable enlargement of the central pulmonary arteries, which can be seen with pulmonary arterial hypertension. 3. Emphysema and aortic atherosclerosis. Aortic  Atherosclerosis (ICD10-I70.0) and Emphysema (ICD10-J43.9). Electronically Signed   By: Dahlia Bailiff MD   On: 02/02/2021 13:35

## 2021-02-03 NOTE — Assessment & Plan Note (Signed)
She has severe shoulder pain I offered her pain medicine According to the patient, she has oxycodone to take as needed at home

## 2021-02-03 NOTE — Assessment & Plan Note (Signed)
She has very mild pancytopenia likely due to recent treatment I will check a vitamin B12 level in her next visit

## 2021-02-03 NOTE — Assessment & Plan Note (Signed)
She has complete response to treatment CT imaging is completely normal Given her high risk situation, I recommend maintenance rituximab We discussed the risk, benefits, side effects of maintenance rituximab every 60 days and she is in agreement to proceed I will schedule that to start in June The patient desire to have shoulder surgery next month

## 2021-02-04 ENCOUNTER — Telehealth: Payer: Self-pay | Admitting: Hematology and Oncology

## 2021-02-04 NOTE — Telephone Encounter (Signed)
Scheduled appts per 4/19 sch msg. Pt aware.  

## 2021-02-17 ENCOUNTER — Other Ambulatory Visit (HOSPITAL_COMMUNITY): Payer: No Typology Code available for payment source

## 2021-02-18 NOTE — Progress Notes (Signed)
Sent message, via epic in basket, requesting orders in epic from surgeon.  

## 2021-02-19 NOTE — H&P (Signed)
Patient's anticipated LOS is less than 2 midnights, meeting these requirements: - Younger than 63 - Lives within 1 hour of care - Has a competent adult at home to recover with post-op recover - NO history of  - Chronic pain requiring opiods  - Diabetes  - Coronary Artery Disease  - Heart failure  - Heart attack  - Stroke  - DVT/VTE  - Cardiac arrhythmia  - Respiratory Failure/COPD  - Renal failure  - Anemia  - Advanced Liver disease       Carrie [name] is an 80 y.o. female.    Chief Complaint: left shoulder pain  HPI: Pt is a 80 y.o. female complaining of left shoulder pain for multiple years. Pain had continually increased since the beginning. X-rays in the clinic show end-stage arthritic changes of the left shoulder. Pt has tried various conservative treatments which have failed to alleviate their symptoms, including injections and therapy. Various options are discussed with the patient. Risks, benefits and expectations were discussed with the patient. Patient understand the risks, benefits and expectations and wishes to proceed with surgery.   PCP:  Domenick Bookbinder, MD  D/C Plans: Home  PMH: Past Medical History:  Diagnosis Date  . A-fib ([place])   . Anemia    "years ago"  . Atrial fibrillation with rapid ventricular response ([place]) 06/25/2015  . Carpal tunnel syndrome, bilateral   . Cervical spondylosis without myelopathy 10/25/2013  . Dental bridge present   . DJD (degenerative joint disease)   . Dysrhythmia    WENT INTO A FIB IN 2017   . Elevated troponin 02/18/2015  . Follicular lymphoma grade 3a ([place]) 02/03/2015  . History of echocardiogram    Echo 12/16: EF 60-65%, no RWMA, severe LAE  . History of nuclear stress test    Myoview 1/17: EF 55%, Normal study. No ischemia or scar.  . Hypothyroid   . Nausea without vomiting 06/20/2015  . Pneumonia   . Stage III chronic kidney disease ([place]) 07/01/2015  . Thrush of mouth and esophagus ([place]) 06/25/2015    PSH: Past  Surgical History:  Procedure Laterality Date  . ABDOMINAL HYSTERECTOMY    . APPENDECTOMY    . BACK SURGERY     X5-lumbar-fusion  . COLONOSCOPY    . DILATION AND CURETTAGE OF UTERUS    . LYMPH NODE BIOPSY Right 01/21/2015   Procedure: RIGHT GROIN LYMPH NODE BIOPSY;  Surgeon: Erroll Luna, MD;  Location: [place];  Service: General;  Laterality: Right;  . MASS EXCISION Left 08/29/2018   Procedure: EXCISION LEFT BACK  MASS;  Surgeon: Erroll Luna, MD;  Location: [place];  Service: General;  Laterality: Left;  . Ovarian cyst resection    . patelar tendon transplants     Left/right  . PORTACATH PLACEMENT Right 02/13/2015   Procedure: INSERTION PORT-A-CATH WITH ULTRASOUND;  Surgeon: Erroll Luna, MD;  Location: [place];  Service: General;  Laterality: Right;  . PORTACATH PLACEMENT N/A 08/29/2018   Procedure: INSERTION PORT-A-CATH WITH ULTRA SOUND ERAS PATHWAY;  Surgeon: Erroll Luna, MD;  Location: [place];  Service: General;  Laterality: N/A;  . TOTAL KNEE ARTHROPLASTY  2011   Right  . TUBAL LIGATION      Social History:  reports that she has never smoked. She has never used smokeless tobacco. She reports current alcohol use. She reports that she does not use drugs.  Allergies:  Allergies  Allergen Reactions  . Amiodarone Other (See Comments)    Hyperthyroidism  Medications: No current facility-administered medications for this encounter.   Current Outpatient Medications  Medication Sig Dispense Refill  . acetaminophen (TYLENOL) 500 MG tablet Take 500 mg by mouth every 6 (six) hours as needed for moderate pain or headache.    Marland Kitchen acyclovir (ZOVIRAX) 400 MG tablet Take 1 tablet (400 mg total) by mouth daily. 30 tablet 3  . allopurinol (ZYLOPRIM) 300 MG tablet Take 1 tablet by mouth daily.    Marland Kitchen apixaban (ELIQUIS) 5 MG TABS tablet Take 5 mg by mouth 2 (two) times daily.     . bisacodyl (DULCOLAX) 5 MG EC tablet Take 5 mg by mouth daily as needed for moderate  constipation.    . diclofenac sodium (VOLTAREN) 1 % GEL Apply 2 g topically 3 (three) times daily as needed (knee pain). 100 g 0  . levothyroxine (SYNTHROID, LEVOTHROID) 112 MCG tablet Take 112 mcg by mouth daily.   9  . lidocaine-prilocaine (EMLA) cream Apply to affected area once 30 g 3  . metoprolol succinate (TOPROL-XL) 25 MG 24 hr tablet Take 0.5 tablets (12.5 mg total) by mouth daily. Take with or immediately following a meal. 90 tablet 3  . ondansetron (ZOFRAN) 8 MG tablet Take 1 tablet (8 mg total) by mouth every 8 (eight) hours as needed for refractory nausea / vomiting. Start on day 2 after bendamustine chemo. 30 tablet 1  . polyvinyl alcohol (LIQUIFILM TEARS) 1.4 % ophthalmic solution Place 1 drop into both eyes as needed for dry eyes.    Marland Kitchen prochlorperazine (COMPAZINE) 10 MG tablet Take 1 tablet (10 mg total) by mouth every 6 (six) hours as needed (Nausea or vomiting). 30 tablet 1   Facility-Administered Medications Ordered in Other Encounters  Medication Dose Route Frequency Provider Last Rate Last Admin  . ondansetron (ZOFRAN) 8 mg in sodium chloride 0.9 % 50 mL IVPB   Intravenous Once Gorsuch, Ni, MD      . sodium chloride 0.9 % injection 10 mL  10 mL Intravenous PRN Alvy Bimler, Ni, MD   10 mL at 04/05/17 1315  . sodium chloride flush (NS) 0.9 % injection 10 mL  10 mL Intracatheter Once Alvy Bimler, Ni, MD        No results found for this or any previous visit (from the past 48 hour(s)). No results found.  ROS: Pain with rom of the left upper extremity  Physical Exam: Alert and oriented 80 y.o. female in no acute distress Cranial nerves 2-12 intact Cervical spine: full rom with no tenderness, nv intact distally Chest: active breath sounds bilaterally, no wheeze rhonchi or rales Heart: regular rate and rhythm, no murmur Abd: non tender non distended with active bowel sounds Hip is stable with rom  Left shoulder painful and weak rom nv intact distally No rashes or edema  distally   Assessment/Plan Assessment: left shoulder cuff arthropathy  Plan:  Patient will undergo a left reverse shoulder by Dr. Veverly Fells at [facility] Risks benefits and expectations were discussed with the patient. Patient understand risks, benefits and expectations and wishes to proceed. Preoperative templating of the joint replacement has been completed, documented, and submitted to the Operating Room personnel in order to optimize intra-operative equipment management.   Merla Riches PA-C, MPAS [facility] is now [facility] [address] [place], [place], [place] [address] Phone: [phone] [email or web address]  [employer]

## 2021-02-24 NOTE — Progress Notes (Signed)
DUE TO COVID-19 ONLY ONE VISITOR IS ALLOWED TO COME WITH YOU AND STAY IN THE WAITING ROOM ONLY DURING PRE OP AND PROCEDURE DAY OF SURGERY. THE 1 VISITOR  MAY VISIT WITH YOU AFTER SURGERY IN YOUR PRIVATE ROOM DURING VISITING HOURS ONLY!  YOU NEED TO HAVE A COVID 19 TEST ON__5/09/2021 _____ @_______ , THIS TEST MUST BE DONE BEFORE SURGERY,  COVID TESTING SITE [address] [place] [address], IT IS ON THE RIGHT GOING OUT [address] APPROXIMATELY  2 MINUTES PAST [employer] ON THE RIGHT. ONCE YOUR COVID TEST IS COMPLETED,  PLEASE BEGIN THE QUARANTINE INSTRUCTIONS AS OUTLINED IN YOUR HANDOUT.                Carrie [name]  02/24/2021   Your procedure is scheduled on:              5/`16/2022   Report to [facility] Main  Entrance   Report to admitting a     0715t AM     Call this number if you have problems the morning of surgery [phone]    REMEMBER: NO  SOLID FOOD CANDY OR GUM AFTER MIDNIGHT. CLEAR LIQUIDS UNTIL     0645am     . NOTHING BY MOUTH EXCEPT CLEAR LIQUIDS UNTIL     0645am  . PLEASE FINISH ENSURE DRINK PER SURGEON ORDER  WHICH NEEDS TO BE COMPLETED AT  0645am     .      CLEAR LIQUID DIET   Foods Allowed                                                                    Coffee and tea, regular and decaf                            Fruit ices (not with fruit pulp)                                      Iced Popsicles                                    Carbonated beverages, regular and diet                                    Cranberry, grape and apple juices Sports drinks like Gatorade Lightly seasoned clear broth or consume(fat free) Sugar, honey syrup ___________________________________________________________________      BRUSH YOUR TEETH MORNING OF SURGERY AND RINSE YOUR MOUTH OUT, NO CHEWING GUM CANDY OR MINTS.     Take these medicines the morning of surgery with A SIP OF WATER:       Toprol, synthroid  DO NOT TAKE ANY DIABETIC  MEDICATIONS DAY OF YOUR SURGERY                               You may not have any metal on your  body including hair pins and              piercings  Do not wear jewelry, make-up, lotions, powders or perfumes, deodorant             Do not wear nail polish on your fingernails.  Do not shave  48 hours prior to surgery.              Men may shave face and neck.   Do not bring valuables to the hospital. [place].  Contacts, dentures or bridgework may not be worn into surgery.  Leave suitcase in the car. After surgery it may be brought to your room.     Patients discharged the day of surgery will not be allowed to drive home. IF YOU ARE HAVING SURGERY AND GOING HOME THE SAME DAY, YOU MUST HAVE AN ADULT TO DRIVE YOU HOME AND BE WITH YOU FOR 24 HOURS. YOU MAY GO HOME BY TAXI OR UBER OR ORTHERWISE, BUT AN ADULT MUST ACCOMPANY YOU HOME AND STAY WITH YOU FOR 24 HOURS.  Name and phone number of your driver:  Special Instructions: N/A              Please read over the following fact sheets you were given: _____________________________________________________________________  [facility] - Preparing for Surgery Before surgery, you can play an important role.  Because skin is not sterile, your skin needs to be as free of germs as possible.  You can reduce the number of germs on your skin by washing with CHG (chlorahexidine gluconate) soap before surgery.  CHG is an antiseptic cleaner which kills germs and bonds with the skin to continue killing germs even after washing. Please DO NOT use if you have an allergy to CHG or antibacterial soaps.  If your skin becomes reddened/irritated stop using the CHG and inform your nurse when you arrive at Short Stay. Do not shave (including legs and underarms) for at least 48 hours prior to the first CHG shower.  You may shave your face/neck. Please follow these instructions carefully:  1.  Shower with CHG Soap the night  before surgery and the  morning of Surgery.  2.  If you choose to wash your hair, wash your hair first as usual with your  normal  shampoo.  3.  After you shampoo, rinse your hair and body thoroughly to remove the  shampoo.                           4.  Use CHG as you would any other liquid soap.  You can apply chg directly  to the skin and wash                       Gently with a scrungie or clean washcloth.  5.  Apply the CHG Soap to your body ONLY FROM THE NECK DOWN.   Do not use on face/ open                           Wound or open sores. Avoid contact with eyes, ears mouth and genitals (private parts).                       Wash face,  Development worker, international aid (private  parts) with your normal soap.             6.  Wash thoroughly, paying special attention to the area where your surgery  will be performed.  7.  Thoroughly rinse your body with warm water from the neck down.  8.  DO NOT shower/wash with your normal soap after using and rinsing off  the CHG Soap.                9.  Pat yourself dry with a clean towel.            10.  Wear clean pajamas.            11.  Place clean sheets on your bed the night of your first shower and do not  sleep with pets. Day of Surgery : Do not apply any lotions/deodorants the morning of surgery.  Please wear clean clothes to the hospital/surgery center.  FAILURE TO FOLLOW THESE INSTRUCTIONS MAY RESULT IN THE CANCELLATION OF YOUR SURGERY PATIENT SIGNATURE_________________________________  NURSE SIGNATURE__________________________________  ________________________________________________________________________             [facility]- Preparing for Total Shoulder Arthroplasty    Before surgery, you can play an important role. Because skin is not sterile, your skin needs to be as free of germs as possible. You can reduce the number of germs on your skin by using the following products. . Benzoyl Peroxide Gel o Reduces the number of germs present on the  skin o Applied twice a day to shoulder area starting two days before surgery    ==================================================================  Please follow these instructions carefully:  BENZOYL PEROXIDE 5% GEL  Please do not use if you have an allergy to benzoyl peroxide.   If your skin becomes reddened/irritated stop using the benzoyl peroxide.  Starting two days before surgery, apply as follows: 1. Apply benzoyl peroxide in the morning and at night. Apply after taking a shower. If you are not taking a shower clean entire shoulder front, back, and side along with the armpit with a clean wet washcloth.  2. Place a quarter-sized dollop on your shoulder and rub in thoroughly, making sure to cover the front, back, and side of your shoulder, along with the armpit.   2 days before ____ AM   ____ PM              1 day before ____ AM   ____ PM                         3. Do this twice a day for two days.  (Last application is the night before surgery, AFTER using the CHG soap as described below).  4. Do NOT apply benzoyl peroxide gel on the day of surgery.

## 2021-02-26 ENCOUNTER — Encounter (HOSPITAL_COMMUNITY)
Admission: RE | Admit: 2021-02-26 | Discharge: 2021-02-26 | Disposition: A | Discharge status: Home / Self Care | Payer: No Typology Code available for payment source | Source: Ambulatory Visit | Attending: Orthopedic Surgery | Admitting: Orthopedic Surgery

## 2021-02-26 ENCOUNTER — Other Ambulatory Visit (HOSPITAL_COMMUNITY)
Admission: RE | Admit: 2021-02-26 | Discharge: 2021-02-26 | Disposition: A | Discharge status: Home / Self Care | Payer: No Typology Code available for payment source | Source: Ambulatory Visit | Attending: Orthopedic Surgery | Admitting: Orthopedic Surgery

## 2021-02-26 ENCOUNTER — Encounter (HOSPITAL_COMMUNITY): Payer: Self-pay

## 2021-02-26 ENCOUNTER — Ambulatory Visit: Payer: Medicare HMO | Admitting: Cardiology

## 2021-02-26 ENCOUNTER — Other Ambulatory Visit: Payer: Self-pay

## 2021-02-26 DIAGNOSIS — Z79899 Other long term (current) drug therapy: Secondary | ICD-10-CM | POA: Diagnosis not present

## 2021-02-26 DIAGNOSIS — I480 Paroxysmal atrial fibrillation: Secondary | ICD-10-CM | POA: Diagnosis not present

## 2021-02-26 DIAGNOSIS — I252 Old myocardial infarction: Secondary | ICD-10-CM | POA: Insufficient documentation

## 2021-02-26 DIAGNOSIS — I129 Hypertensive chronic kidney disease with stage 1 through stage 4 chronic kidney disease, or unspecified chronic kidney disease: Secondary | ICD-10-CM | POA: Diagnosis not present

## 2021-02-26 DIAGNOSIS — Z20822 Contact with and (suspected) exposure to covid-19: Secondary | ICD-10-CM | POA: Insufficient documentation

## 2021-02-26 DIAGNOSIS — Z01812 Encounter for preprocedural laboratory examination: Secondary | ICD-10-CM | POA: Diagnosis present

## 2021-02-26 DIAGNOSIS — Z7901 Long term (current) use of anticoagulants: Secondary | ICD-10-CM | POA: Insufficient documentation

## 2021-02-26 DIAGNOSIS — N183 Chronic kidney disease, stage 3 unspecified: Secondary | ICD-10-CM | POA: Diagnosis not present

## 2021-02-26 DIAGNOSIS — M75102 Unspecified rotator cuff tear or rupture of left shoulder, not specified as traumatic: Secondary | ICD-10-CM | POA: Insufficient documentation

## 2021-02-26 HISTORY — DX: Essential (primary) hypertension: I10

## 2021-02-26 HISTORY — DX: Headache, unspecified: R51.9

## 2021-02-26 HISTORY — DX: Dyspnea, unspecified: R06.00

## 2021-02-26 LAB — CBC
HCT: 39.2 % (ref 36.0–46.0)
Hemoglobin: 12.7 g/dL (ref 12.0–15.0)
MCH: 31.8 pg (ref 26.0–34.0)
MCHC: 32.4 g/dL (ref 30.0–36.0)
MCV: 98 fL (ref 80.0–100.0)
Platelets: 170 10*3/uL (ref 150–400)
RBC: 4 MIL/uL (ref 3.87–5.11)
RDW: 12.3 % (ref 11.5–15.5)
WBC: 4.8 10*3/uL (ref 4.0–10.5)
nRBC: 0 % (ref 0.0–0.2)

## 2021-02-26 LAB — BASIC METABOLIC PANEL
Anion gap: 5 (ref 5–15)
BUN: 25 mg/dL — ABNORMAL HIGH (ref 8–23)
CO2: 28 mmol/L (ref 22–32)
Calcium: 9.3 mg/dL (ref 8.9–10.3)
Chloride: 108 mmol/L (ref 98–111)
Creatinine, Ser: 1.29 mg/dL — ABNORMAL HIGH (ref 0.44–1.00)
GFR, Estimated: 42 mL/min — ABNORMAL LOW (ref >60)
Glucose, Bld: 64 mg/dL — ABNORMAL LOW (ref 70–99)
Potassium: 4.5 mmol/L (ref 3.5–5.1)
Sodium: 141 mmol/L (ref 135–145)

## 2021-02-26 LAB — SARS CORONAVIRUS 2 (TAT 6-24 HRS): SARS Coronavirus 2: NEGATIVE

## 2021-02-26 LAB — SURGICAL PCR SCREEN
MRSA, PCR: NEGATIVE
Staphylococcus aureus: NEGATIVE

## 2021-02-26 NOTE — Progress Notes (Addendum)
Anesthesia Review:  PCP: [name]- [facility] request[facility] via [facility]  Called [facility]- They do not have clearance on her from PCP.  Cardiologist : DR Dorthy Cooler- preop clearance 02/03/21  Chest x-ray : EKG : 02/03/2021  Echo :11/05/2020  Stress test: Cardiac Cath :  Activity level: can do a flight of stairs without difficulty  Sleep Study/ CPAP : none  Fasting Blood Sugar :      / Checks Blood Sugar -- times a day:   Blood Thinner/ Instructions /Last Dose: ASA / Instructions/ Last Dose :  Elliquis- To stop on 02/27/2021 per pt  Pt has PORT  Hx of non Hodgkins lymphoma x 3 per pt  Dr Alvy Bimler - LOV 02/03/2021  BMP done 02/26/21 routed to DR Veverly Fells.  At 1220pm on 02/26/2021 glucose - 64 on lab results.  Attempted to call pt and LVMM to have her to call me.  PT did not report being a diabetic at time of preop appt.   Made Roanna Banning aware of BMP glucose of 64 and that I had attempted to call pt and LVMM.  No new orders given except to check glucose am of surgery.   At 1530 lvmm  For pt to call me at [phone].   Made Roanna Banning aware that left another voice mail message for pt to call me and still no return phone call from pt.  .   Pt called back at 430 pm and stated she feel fine and that she ate breakfast this am prior to preop appt and she had eaten lunch since preop appt and that she feels fine.  Made pt aware that glucose reading on preop labs was 64 and normal is 70-110.

## 2021-02-27 ENCOUNTER — Other Ambulatory Visit (HOSPITAL_COMMUNITY): Payer: No Typology Code available for payment source

## 2021-02-27 NOTE — Progress Notes (Signed)
Anesthesia Chart Review   Case: [id number] Date/Time: 03/02/21 0931   Procedure: REVERSE SHOULDER ARTHROPLASTY (Left Shoulder) - with interscalene block   Anesthesia type: General   Pre-op diagnosis: Left shoulder rotator cuff tear arthropathy   Location: [facility] ROOM 08 / [facility]   Surgeons: Netta Cedars, MD      DISCUSSION:80 y.o. never smoker with h/o HTN, Stage III CKD, PAF s/p DCCV (on Eliquis), follicular lymphoma, NICM, left shoulder rotator cuff arthropathy scheduled for above procedure 03/02/2021 with Dr. Netta Cedars.   Pt last seen by cardiology 02/03/2021. Per OV note, "Preoperative clearance before undergoing reverse total shoulder surgery by Dr. Veverly Fells. Patient without cardiac symptoms but not very active because of recent chemo fatigue, knee and shoulder problems.Marland Kitchen METs  4.4.Eliquis managed by cardiologist at [facility] and they will recommend how long she can hold it for sugery. Can proceed with surgery without further cardiac workup.   According to the Revised Cardiac Risk Index (RCRI), her Perioperative Risk of Major Cardiac Event is (%): 0.9  Her Functional Capacity in METs is: 4.4 according to the Duke Activity Status Index (DASI)."  Pt reports last dose of Eliquis 02/26/21.  VS: BP 128/69   Pulse 72   Temp 37.1 C (Oral)   Resp 16   Ht 5\' 1"  (1.549 m)   Wt 63.5 kg   SpO2 97%   BMI 26.45 kg/m   PROVIDERS: Clinic, Jake Bathe, MD is Cardiologist  LABS: Labs reviewed: Acceptable for surgery. (all labs ordered are listed, but only abnormal results are displayed)  Labs Reviewed  BASIC METABOLIC PANEL - Abnormal; Notable for the following components:      Result Value   Glucose, Bld 64 (*)    BUN 25 (*)    Creatinine, Ser 1.29 (*)    GFR, Estimated 42 (*)    All other components within normal limits  SURGICAL PCR SCREEN  CBC     IMAGES:   EKG: 02/03/2021 Rate 71 bpm  NSR  CV: Echo 10/31/2020 1. Left ventricular ejection fraction, by  estimation, is 55 to 60%. The  left ventricle has normal function. The left ventricle has no regional  wall motion abnormalities. Left ventricular diastolic parameters are  consistent with Grade I diastolic  dysfunction (impaired relaxation). Elevated left ventricular end-diastolic  pressure. The average left ventricular global longitudinal strain is -17.9  %. The global longitudinal strain is normal.  2. Right ventricular systolic function is normal. The right ventricular  size is normal. There is normal pulmonary artery systolic pressure.  3. Left atrial size was moderately dilated.  4. The mitral valve is normal in structure. Mild to moderate mitral valve  regurgitation. No evidence of mitral stenosis.  5. The aortic valve is tricuspid. There is mild thickening of the aortic  valve. Aortic valve regurgitation is mild. No aortic stenosis is present.  6. The inferior vena cava is normal in size with greater than 50%  respiratory variability, suggesting right atrial pressure of 3 mmHg.  Stress Test 10/22/2015  Nuclear stress EF: 55%.  There was no ST segment deviation noted during stress.  The study is normal.  This is a low risk study.  The left ventricular ejection fraction is normal (55-65%).   Normal study. No ischemia or scar.  Past Medical History:  Diagnosis Date  . A-fib ([place])   . Anemia    pt denies   . Atrial fibrillation with rapid ventricular response ([place]) 06/25/2015  . Carpal tunnel  syndrome, bilateral    pt denies   . Cervical spondylosis without myelopathy 10/25/2013  . Dental bridge present   . DJD (degenerative joint disease)   . Dyspnea    sometimes hx of cancer non hodgkins lymphoma   . Dysrhythmia    WENT INTO A FIB IN 2017   . Elevated troponin 02/18/2015  . Follicular lymphoma grade 3a ([place]) 02/03/2015   hx of nonhodgkins lymphoma x 3   . Headache    sometimes a headache   . History of echocardiogram    Echo 12/16: EF 60-65%, no RWMA, severe LAE   . History of nuclear stress test    Myoview 1/17: EF 55%, Normal study. No ischemia or scar.  . Hypertension   . Hypothyroid   . Nausea without vomiting 06/20/2015  . Stage III chronic kidney disease ([place]) 07/01/2015  . Thrush of mouth and esophagus ([place]) 06/25/2015    Past Surgical History:  Procedure Laterality Date  . ABDOMINAL HYSTERECTOMY    . APPENDECTOMY    . BACK SURGERY     X5-lumbar-fusion  . COLONOSCOPY    . DILATION AND CURETTAGE OF UTERUS    . LYMPH NODE BIOPSY Right 01/21/2015   Procedure: RIGHT GROIN LYMPH NODE BIOPSY;  Surgeon: Erroll Luna, MD;  Location: [place];  Service: General;  Laterality: Right;  . MASS EXCISION Left 08/29/2018   Procedure: EXCISION LEFT BACK  MASS;  Surgeon: Erroll Luna, MD;  Location: [place];  Service: General;  Laterality: Left;  . Ovarian cyst resection    . patelar tendon transplants     Left/right  . PORTACATH PLACEMENT Right 02/13/2015   Procedure: INSERTION PORT-A-CATH WITH ULTRASOUND;  Surgeon: Erroll Luna, MD;  Location: [place];  Service: General;  Laterality: Right;  . PORTACATH PLACEMENT N/A 08/29/2018   Procedure: INSERTION PORT-A-CATH WITH ULTRA SOUND ERAS PATHWAY;  Surgeon: Erroll Luna, MD;  Location: [place];  Service: General;  Laterality: N/A;  . TOTAL KNEE ARTHROPLASTY  2011   Right  . TUBAL LIGATION      MEDICATIONS: . acetaminophen (TYLENOL) 500 MG tablet  . acyclovir (ZOVIRAX) 400 MG tablet  . apixaban (ELIQUIS) 5 MG TABS tablet  . diclofenac sodium (VOLTAREN) 1 % GEL  . levothyroxine (SYNTHROID, LEVOTHROID) 112 MCG tablet  . lidocaine-prilocaine (EMLA) cream  . metoprolol succinate (TOPROL-XL) 25 MG 24 hr tablet  . ondansetron (ZOFRAN) 8 MG tablet  . polyvinyl alcohol (LIQUIFILM TEARS) 1.4 % ophthalmic solution  . prochlorperazine (COMPAZINE) 10 MG tablet   No current facility-administered medications for this encounter.   . ondansetron (ZOFRAN) 8 mg in sodium chloride 0.9 % 50 mL IVPB   . sodium chloride 0.9 % injection 10 mL  . sodium chloride flush (NS) 0.9 % injection 10 mL     Konrad Felix, PA-C [facility] Pre-Surgical Testing [phone]

## 2021-02-27 NOTE — Anesthesia Preprocedure Evaluation (Addendum)
Anesthesia Evaluation  Patient identified by MRN, date of birth, ID band Patient awake    Reviewed: Allergy & Precautions, NPO status , Patient's Chart, lab work & pertinent test results  Airway Mallampati: I  TM Distance: >3 FB Neck ROM: Full    Dental no notable dental hx. (+) Teeth Intact, Dental Advisory Given   Pulmonary shortness of breath,    Pulmonary exam normal breath sounds clear to auscultation       Cardiovascular hypertension, Pt. on home beta blockers and Pt. on medications +CHF  Normal cardiovascular exam+ dysrhythmias (on eliquis) Atrial Fibrillation  Rhythm:Regular Rate:Normal  EKG: 02/03/2021 Rate 71 bpm  NSR  CV: Echo 10/31/2020 1. Left ventricular ejection fraction, by estimation, is 55 to 60%. The  left ventricle has normal function. The left ventricle has no regional  wall motion abnormalities. Left ventricular diastolic parameters are  consistent with Grade I diastolic  dysfunction (impaired relaxation). Elevated left ventricular end-diastolic  pressure. The average left ventricular global longitudinal strain is -17.9  %. The global longitudinal strain is normal.  2. Right ventricular systolic function is normal. The right ventricular  size is normal. There is normal pulmonary artery systolic pressure.  3. Left atrial size was moderately dilated.  4. The mitral valve is normal in structure. Mild to moderate mitral valve  regurgitation. No evidence of mitral stenosis.  5. The aortic valve is tricuspid. There is mild thickening of the aortic  valve. Aortic valve regurgitation is mild. No aortic stenosis is present.  6. The inferior vena cava is normal in size with greater than 50%  respiratory variability, suggesting right atrial pressure of 3 mmHg.  Stress Test 10/22/2015  Nuclear stress EF: 55%.  There was no ST segment deviation noted during stress.  The study is normal.  This is a low  risk study.  The left ventricular ejection fraction is normal (55-65%).    Neuro/Psych  Headaches, PSYCHIATRIC DISORDERS Anxiety    GI/Hepatic negative GI ROS, Neg liver ROS,   Endo/Other  diabetes (2/2 steroids), Well ControlledHypothyroidism   Renal/GU Renal InsufficiencyRenal disease (Cr 1.29, K 4.5)  negative genitourinary   Musculoskeletal  (+) Arthritis ,   Abdominal   Peds  Hematology negative hematology ROS (+) Follicular lymphoma, finished chemo in 12/2020   Anesthesia Other Findings   Reproductive/Obstetrics                           Anesthesia Physical Anesthesia Plan  ASA: III  Anesthesia Plan: General and Regional   Post-op Pain Management:  Regional for Post-op pain   Induction: Intravenous  PONV Risk Score and Plan: 3 and Dexamethasone, Ondansetron and Treatment may vary due to age or medical condition  Airway Management Planned: Oral ETT  Additional Equipment:   Intra-op Plan:   Post-operative Plan: Extubation in OR  Informed Consent: I have reviewed the patients History and Physical, chart, labs and discussed the procedure including the risks, benefits and alternatives for the proposed anesthesia with the patient or authorized representative who has indicated his/her understanding and acceptance.     Dental advisory given  Plan Discussed with: CRNA  Anesthesia Plan Comments:        Anesthesia Quick Evaluation

## 2021-03-02 ENCOUNTER — Other Ambulatory Visit: Payer: Self-pay

## 2021-03-02 ENCOUNTER — Ambulatory Visit (HOSPITAL_COMMUNITY): Payer: No Typology Code available for payment source | Admitting: Anesthesiology

## 2021-03-02 ENCOUNTER — Observation Stay (HOSPITAL_COMMUNITY): Payer: No Typology Code available for payment source

## 2021-03-02 ENCOUNTER — Encounter (HOSPITAL_COMMUNITY): Payer: Self-pay | Admitting: Orthopedic Surgery

## 2021-03-02 ENCOUNTER — Encounter (HOSPITAL_COMMUNITY)
Admission: RE | Disposition: A | Discharge status: Home / Self Care | Payer: Self-pay | Source: Home / Self Care | Attending: Orthopedic Surgery

## 2021-03-02 ENCOUNTER — Observation Stay (HOSPITAL_COMMUNITY)
Admission: RE | Admit: 2021-03-02 | Discharge: 2021-03-03 | Disposition: A | Discharge status: Home / Self Care | Payer: No Typology Code available for payment source | Attending: Orthopedic Surgery | Admitting: Orthopedic Surgery

## 2021-03-02 ENCOUNTER — Ambulatory Visit (HOSPITAL_COMMUNITY): Payer: No Typology Code available for payment source | Admitting: Physician Assistant

## 2021-03-02 DIAGNOSIS — Z96651 Presence of right artificial knee joint: Secondary | ICD-10-CM | POA: Diagnosis not present

## 2021-03-02 DIAGNOSIS — N183 Chronic kidney disease, stage 3 unspecified: Secondary | ICD-10-CM | POA: Diagnosis not present

## 2021-03-02 DIAGNOSIS — E1122 Type 2 diabetes mellitus with diabetic chronic kidney disease: Secondary | ICD-10-CM | POA: Insufficient documentation

## 2021-03-02 DIAGNOSIS — M75102 Unspecified rotator cuff tear or rupture of left shoulder, not specified as traumatic: Secondary | ICD-10-CM | POA: Insufficient documentation

## 2021-03-02 DIAGNOSIS — M19012 Primary osteoarthritis, left shoulder: Secondary | ICD-10-CM | POA: Diagnosis present

## 2021-03-02 DIAGNOSIS — Z7901 Long term (current) use of anticoagulants: Secondary | ICD-10-CM | POA: Insufficient documentation

## 2021-03-02 DIAGNOSIS — Z79899 Other long term (current) drug therapy: Secondary | ICD-10-CM | POA: Diagnosis not present

## 2021-03-02 DIAGNOSIS — E039 Hypothyroidism, unspecified: Secondary | ICD-10-CM | POA: Insufficient documentation

## 2021-03-02 DIAGNOSIS — I129 Hypertensive chronic kidney disease with stage 1 through stage 4 chronic kidney disease, or unspecified chronic kidney disease: Secondary | ICD-10-CM | POA: Diagnosis not present

## 2021-03-02 DIAGNOSIS — I4891 Unspecified atrial fibrillation: Secondary | ICD-10-CM | POA: Diagnosis not present

## 2021-03-02 DIAGNOSIS — Z96612 Presence of left artificial shoulder joint: Secondary | ICD-10-CM

## 2021-03-02 HISTORY — PX: REVERSE SHOULDER ARTHROPLASTY: SHX5054

## 2021-03-02 LAB — GLUCOSE, CAPILLARY: Glucose-Capillary: 88 mg/dL (ref 70–99)

## 2021-03-02 SURGERY — ARTHROPLASTY, SHOULDER, TOTAL, REVERSE
Anesthesia: Regional | Site: Shoulder | Laterality: Left

## 2021-03-02 MED ORDER — ROCURONIUM BROMIDE 10 MG/ML (PF) SYRINGE
PREFILLED_SYRINGE | INTRAVENOUS | Status: AC
  Filled 2021-03-02: qty 10

## 2021-03-02 MED ORDER — SUCCINYLCHOLINE CHLORIDE 200 MG/10ML IV SOSY
PREFILLED_SYRINGE | INTRAVENOUS | Status: AC
  Filled 2021-03-02: qty 10

## 2021-03-02 MED ORDER — METOCLOPRAMIDE HCL 5 MG/ML IJ SOLN: 5.0000 mg | Freq: Three times a day (TID) | INTRAMUSCULAR | Status: DC | PRN

## 2021-03-02 MED ORDER — PHENOL 1.4 % MT LIQD: 1.0000 | OROMUCOSAL | Status: DC | PRN

## 2021-03-02 MED ORDER — ONDANSETRON HCL 4 MG/2ML IJ SOLN
INTRAMUSCULAR | Status: AC
  Filled 2021-03-02: qty 2

## 2021-03-02 MED ORDER — MENTHOL 3 MG MT LOZG: 1.0000 | LOZENGE | OROMUCOSAL | Status: DC | PRN

## 2021-03-02 MED ORDER — SODIUM CHLORIDE 0.9 % IV SOLN
1.0000 g | Freq: Four times a day (QID) | INTRAVENOUS | Status: AC
  Administered 2021-03-02 – 2021-03-03 (×3): 1 g via INTRAVENOUS
  Filled 2021-03-02 (×3): qty 1

## 2021-03-02 MED ORDER — ONDANSETRON HCL 4 MG/2ML IJ SOLN: 4.0000 mg | Freq: Four times a day (QID) | INTRAMUSCULAR | Status: DC | PRN

## 2021-03-02 MED ORDER — DEXAMETHASONE SODIUM PHOSPHATE 10 MG/ML IJ SOLN
INTRAMUSCULAR | Status: DC | PRN
  Administered 2021-03-02: 10 mg via INTRAVENOUS

## 2021-03-02 MED ORDER — ONDANSETRON HCL 4 MG PO TABS: 8.0000 mg | ORAL_TABLET | Freq: Three times a day (TID) | ORAL | Status: DC | PRN

## 2021-03-02 MED ORDER — MIDAZOLAM HCL 2 MG/2ML IJ SOLN
1.0000 mg | INTRAMUSCULAR | Status: DC
  Filled 2021-03-02: qty 2

## 2021-03-02 MED ORDER — CEFAZOLIN SODIUM-DEXTROSE 2-4 GM/100ML-% IV SOLN
2.0000 g | INTRAVENOUS | Status: AC
  Administered 2021-03-02: 2 g via INTRAVENOUS
  Filled 2021-03-02: qty 100

## 2021-03-02 MED ORDER — BISACODYL 10 MG RE SUPP: 10.0000 mg | Freq: Every day | RECTAL | Status: DC | PRN

## 2021-03-02 MED ORDER — PHENYLEPHRINE HCL-NACL 10-0.9 MG/250ML-% IV SOLN
INTRAVENOUS | Status: DC | PRN
  Administered 2021-03-02: 20 ug/min via INTRAVENOUS

## 2021-03-02 MED ORDER — FENTANYL CITRATE (PF) 100 MCG/2ML IJ SOLN
INTRAMUSCULAR | Status: AC
  Filled 2021-03-02: qty 2

## 2021-03-02 MED ORDER — APIXABAN 5 MG PO TABS
5.0000 mg | ORAL_TABLET | Freq: Two times a day (BID) | ORAL | Status: DC
  Administered 2021-03-03: 5 mg via ORAL
  Filled 2021-03-02: qty 1

## 2021-03-02 MED ORDER — DOCUSATE SODIUM 100 MG PO CAPS
100.0000 mg | ORAL_CAPSULE | Freq: Two times a day (BID) | ORAL | Status: DC
  Filled 2021-03-02 (×2): qty 1

## 2021-03-02 MED ORDER — CEFAZOLIN SODIUM-DEXTROSE 1-4 GM/50ML-% IV SOLN
1.0000 g | Freq: Four times a day (QID) | INTRAVENOUS | Status: DC
  Filled 2021-03-02: qty 50

## 2021-03-02 MED ORDER — ACETAMINOPHEN 500 MG PO TABS
1000.0000 mg | ORAL_TABLET | Freq: Once | ORAL | Status: AC
  Administered 2021-03-02: 1000 mg via ORAL
  Filled 2021-03-02: qty 2

## 2021-03-02 MED ORDER — BUPIVACAINE LIPOSOME 1.3 % IJ SUSP
INTRAMUSCULAR | Status: DC | PRN
  Administered 2021-03-02: 10 mL via PERINEURAL

## 2021-03-02 MED ORDER — BUPIVACAINE-EPINEPHRINE (PF) 0.25% -1:200000 IJ SOLN
INTRAMUSCULAR | Status: AC
  Filled 2021-03-02: qty 30

## 2021-03-02 MED ORDER — DEXAMETHASONE SODIUM PHOSPHATE 10 MG/ML IJ SOLN
INTRAMUSCULAR | Status: AC
  Filled 2021-03-02: qty 1

## 2021-03-02 MED ORDER — LIDOCAINE 2% (20 MG/ML) 5 ML SYRINGE
INTRAMUSCULAR | Status: AC
  Filled 2021-03-02: qty 5

## 2021-03-02 MED ORDER — BUPIVACAINE HCL (PF) 0.5 % IJ SOLN
INTRAMUSCULAR | Status: DC | PRN
  Administered 2021-03-02: 15 mL via PERINEURAL

## 2021-03-02 MED ORDER — SUCCINYLCHOLINE CHLORIDE 200 MG/10ML IV SOSY
PREFILLED_SYRINGE | INTRAVENOUS | Status: DC | PRN
  Administered 2021-03-02: 140 mg via INTRAVENOUS

## 2021-03-02 MED ORDER — FENTANYL CITRATE (PF) 100 MCG/2ML IJ SOLN
50.0000 ug | INTRAMUSCULAR | Status: DC
  Administered 2021-03-02: 100 ug via INTRAVENOUS
  Filled 2021-03-02: qty 2

## 2021-03-02 MED ORDER — PHENYLEPHRINE 40 MCG/ML (10ML) SYRINGE FOR IV PUSH (FOR BLOOD PRESSURE SUPPORT)
PREFILLED_SYRINGE | INTRAVENOUS | Status: DC | PRN
  Administered 2021-03-02 (×2): 120 ug via INTRAVENOUS

## 2021-03-02 MED ORDER — ACETAMINOPHEN 500 MG PO TABS: 500.0000 mg | ORAL_TABLET | Freq: Four times a day (QID) | ORAL | Status: DC | PRN

## 2021-03-02 MED ORDER — LACTATED RINGERS IV SOLN: INTRAVENOUS | Status: DC

## 2021-03-02 MED ORDER — CHLORHEXIDINE GLUCONATE 0.12 % MT SOLN: 15.0000 mL | Freq: Once | OROMUCOSAL | Status: AC

## 2021-03-02 MED ORDER — SODIUM CHLORIDE 0.9 % IV SOLN: INTRAVENOUS | Status: DC

## 2021-03-02 MED ORDER — ROCURONIUM BROMIDE 10 MG/ML (PF) SYRINGE
PREFILLED_SYRINGE | INTRAVENOUS | Status: DC | PRN
  Administered 2021-03-02: 10 mg via INTRAVENOUS

## 2021-03-02 MED ORDER — ORAL CARE MOUTH RINSE
15.0000 mL | Freq: Once | OROMUCOSAL | Status: AC
  Administered 2021-03-02: 15 mL via OROMUCOSAL

## 2021-03-02 MED ORDER — POLYETHYLENE GLYCOL 3350 17 G PO PACK: 17.0000 g | PACK | Freq: Every day | ORAL | Status: DC | PRN

## 2021-03-02 MED ORDER — ONDANSETRON HCL 4 MG/2ML IJ SOLN
INTRAMUSCULAR | Status: DC | PRN
  Administered 2021-03-02: 4 mg via INTRAVENOUS

## 2021-03-02 MED ORDER — LIDOCAINE 2% (20 MG/ML) 5 ML SYRINGE
INTRAMUSCULAR | Status: DC | PRN
  Administered 2021-03-02: 40 mg via INTRAVENOUS

## 2021-03-02 MED ORDER — OXYCODONE HCL 5 MG PO TABS: 5.0000 mg | ORAL_TABLET | ORAL | Status: DC | PRN

## 2021-03-02 MED ORDER — PROPOFOL 10 MG/ML IV BOLUS
INTRAVENOUS | Status: DC | PRN
  Administered 2021-03-02: 90 mg via INTRAVENOUS

## 2021-03-02 MED ORDER — FENTANYL CITRATE (PF) 100 MCG/2ML IJ SOLN: 25.0000 ug | INTRAMUSCULAR | Status: DC | PRN

## 2021-03-02 MED ORDER — LEVOTHYROXINE SODIUM 112 MCG PO TABS
112.0000 ug | ORAL_TABLET | Freq: Every day | ORAL | Status: DC
  Administered 2021-03-03: 112 ug via ORAL
  Filled 2021-03-02: qty 1

## 2021-03-02 MED ORDER — METOCLOPRAMIDE HCL 5 MG PO TABS: 5.0000 mg | ORAL_TABLET | Freq: Three times a day (TID) | ORAL | Status: DC | PRN

## 2021-03-02 MED ORDER — SODIUM CHLORIDE 0.9 % IR SOLN
Status: DC | PRN
  Administered 2021-03-02: 1000 mL

## 2021-03-02 MED ORDER — PROPOFOL 10 MG/ML IV BOLUS
INTRAVENOUS | Status: AC
  Filled 2021-03-02: qty 20

## 2021-03-02 MED ORDER — PHENYLEPHRINE HCL (PRESSORS) 10 MG/ML IV SOLN
INTRAVENOUS | Status: AC
  Filled 2021-03-02: qty 1

## 2021-03-02 MED ORDER — METOPROLOL SUCCINATE ER 25 MG PO TB24
12.5000 mg | ORAL_TABLET | Freq: Every day | ORAL | Status: DC
  Administered 2021-03-03: 12.5 mg via ORAL
  Filled 2021-03-02: qty 1

## 2021-03-02 MED ORDER — HYDROMORPHONE HCL 1 MG/ML IJ SOLN: 0.5000 mg | INTRAMUSCULAR | Status: DC | PRN

## 2021-03-02 MED ORDER — POLYVINYL ALCOHOL 1.4 % OP SOLN: 1.0000 [drp] | OPHTHALMIC | Status: DC | PRN

## 2021-03-02 MED ORDER — PHENYLEPHRINE 40 MCG/ML (10ML) SYRINGE FOR IV PUSH (FOR BLOOD PRESSURE SUPPORT)
PREFILLED_SYRINGE | INTRAVENOUS | Status: AC
  Filled 2021-03-02: qty 10

## 2021-03-02 MED ORDER — ONDANSETRON HCL 4 MG PO TABS: 4.0000 mg | ORAL_TABLET | Freq: Four times a day (QID) | ORAL | Status: DC | PRN

## 2021-03-02 MED ORDER — FENTANYL CITRATE (PF) 100 MCG/2ML IJ SOLN
INTRAMUSCULAR | Status: DC | PRN
  Administered 2021-03-02: 50 ug via INTRAVENOUS

## 2021-03-02 MED ORDER — OXYCODONE-ACETAMINOPHEN 5-325 MG PO TABS: 0.5000 | ORAL_TABLET | Freq: Four times a day (QID) | ORAL | 0 refills | Status: DC | PRN

## 2021-03-02 MED ORDER — BUPIVACAINE-EPINEPHRINE (PF) 0.25% -1:200000 IJ SOLN
INTRAMUSCULAR | Status: DC | PRN
  Administered 2021-03-02: 11 mL

## 2021-03-02 MED ORDER — ACETAMINOPHEN 325 MG PO TABS
325.0000 mg | ORAL_TABLET | Freq: Four times a day (QID) | ORAL | Status: DC | PRN
  Administered 2021-03-02: 650 mg via ORAL
  Filled 2021-03-02 (×2): qty 2

## 2021-03-02 SURGICAL SUPPLY — 70 items
AID PSTN UNV HD RSTRNT DISP (MISCELLANEOUS) ×1
BAG SPEC THK2 15X12 ZIP CLS (MISCELLANEOUS)
BAG ZIPLOCK 12X15 (MISCELLANEOUS) IMPLANT
BIT DRILL 1.6MX128 (BIT) IMPLANT
BIT DRILL 170X2.5X (BIT) IMPLANT
BIT DRL 170X2.5X (BIT) ×1
BLADE SAG 18X100X1.27 (BLADE) ×2 IMPLANT
CLOSURE STERI STRIP 1/2 X4 (GAUZE/BANDAGES/DRESSINGS) ×1 IMPLANT
COVER BACK TABLE 60X90IN (DRAPES) ×2 IMPLANT
COVER SURGICAL LIGHT HANDLE (MISCELLANEOUS) ×2 IMPLANT
DECANTER SPIKE VIAL GLASS SM (MISCELLANEOUS) ×2 IMPLANT
DRAPE INCISE IOBAN 66X45 STRL (DRAPES) ×2 IMPLANT
DRAPE ORTHO SPLIT 77X108 STRL (DRAPES) ×4
DRAPE SHEET LG 3/4 BI-LAMINATE (DRAPES) ×2 IMPLANT
DRAPE SURG ORHT 6 SPLT 77X108 (DRAPES) ×2 IMPLANT
DRAPE TOP 10253 STERILE (DRAPES) ×2 IMPLANT
DRAPE U-SHAPE 47X51 STRL (DRAPES) ×2 IMPLANT
DRILL 2.5 (BIT) ×2
DRSG ADAPTIC 3X8 NADH LF (GAUZE/BANDAGES/DRESSINGS) ×2 IMPLANT
DRSG PAD ABDOMINAL 8X10 ST (GAUZE/BANDAGES/DRESSINGS) ×2 IMPLANT
DURAPREP 26ML APPLICATOR (WOUND CARE) ×2 IMPLANT
ELECT BLADE TIP CTD 4 INCH (ELECTRODE) ×2 IMPLANT
ELECT NDL TIP 2.8 STRL (NEEDLE) ×1 IMPLANT
ELECT NEEDLE TIP 2.8 STRL (NEEDLE) ×2 IMPLANT
ELECT REM PT RETURN 15FT ADLT (MISCELLANEOUS) ×2 IMPLANT
EPI LT SZ 1 (Orthopedic Implant) ×2 IMPLANT
EPIPHYSIS LT SZ 1 (Orthopedic Implant) IMPLANT
FACESHIELD WRAPAROUND (MASK) ×2 IMPLANT
FACESHIELD WRAPAROUND OR TEAM (MASK) ×1 IMPLANT
GAUZE SPONGE 4X4 12PLY STRL (GAUZE/BANDAGES/DRESSINGS) ×2 IMPLANT
GLENOSPHERE DELTA XTEND LAT 38 (Miscellaneous) ×1 IMPLANT
GLOVE BIOGEL PI ORTHO PRO 7.5 (GLOVE) ×1
GLOVE PI ORTHO PRO STRL 7.5 (GLOVE) ×1 IMPLANT
GLOVE SURG ORTHO LTX SZ7.5 (GLOVE) ×2 IMPLANT
GLOVE SURG ORTHO LTX SZ8.5 (GLOVE) ×2 IMPLANT
GLOVE SURG POLY ORTHO LF SZ8 (GLOVE) ×2 IMPLANT
GOWN STRL REUS W/TWL XL LVL3 (GOWN DISPOSABLE) ×4 IMPLANT
KIT BASIN OR (CUSTOM PROCEDURE TRAY) ×2 IMPLANT
KIT TURNOVER KIT A (KITS) ×2 IMPLANT
MANIFOLD NEPTUNE II (INSTRUMENTS) ×2 IMPLANT
METAGLENE DELTA EXTEND (Trauma) IMPLANT
METAGLENE DXTEND (Trauma) ×2 IMPLANT
NDL MAYO CATGUT SZ4 TPR NDL (NEEDLE) IMPLANT
NEEDLE MAYO CATGUT SZ4 (NEEDLE) IMPLANT
NS IRRIG 1000ML POUR BTL (IV SOLUTION) ×2 IMPLANT
PACK SHOULDER (CUSTOM PROCEDURE TRAY) ×2 IMPLANT
PIN GUIDE 1.2 (PIN) ×1 IMPLANT
PIN GUIDE GLENOPHERE 1.5MX300M (PIN) ×1 IMPLANT
PIN METAGLENE 2.5 (PIN) ×1 IMPLANT
PROTECTOR NERVE ULNAR (MISCELLANEOUS) ×2 IMPLANT
RESTRAINT HEAD UNIVERSAL NS (MISCELLANEOUS) ×2 IMPLANT
SCREW 4.5X36MM (Screw) ×2 IMPLANT
SLING ARM FOAM STRAP LRG (SOFTGOODS) ×1 IMPLANT
SMARTMIX MINI TOWER (MISCELLANEOUS)
SPACER 38 PLUS 3 (Spacer) ×1 IMPLANT
STEM HUMERAL SZ8 STANDARD (Stem) ×2 IMPLANT
STEM HUMERAL SZ8 STD (Stem) IMPLANT
STRIP CLOSURE SKIN 1/2X4 (GAUZE/BANDAGES/DRESSINGS) ×2 IMPLANT
SUCTION FRAZIER HANDLE 10FR (MISCELLANEOUS) ×2
SUCTION TUBE FRAZIER 10FR DISP (MISCELLANEOUS) ×1 IMPLANT
SUT FIBERWIRE #2 38 T-5 BLUE (SUTURE) ×4
SUT MNCRL AB 4-0 PS2 18 (SUTURE) ×2 IMPLANT
SUT VIC AB 0 CT1 36 (SUTURE) ×4 IMPLANT
SUT VIC AB 0 CT2 27 (SUTURE) ×2 IMPLANT
SUT VIC AB 2-0 CT1 27 (SUTURE) ×2
SUT VIC AB 2-0 CT1 TAPERPNT 27 (SUTURE) ×1 IMPLANT
SUTURE FIBERWR #2 38 T-5 BLUE (SUTURE) ×2 IMPLANT
TAPE CLOTH SURG 4X10 WHT LF (GAUZE/BANDAGES/DRESSINGS) ×1 IMPLANT
TOWEL OR 17X26 10 PK STRL BLUE (TOWEL DISPOSABLE) ×2 IMPLANT
TOWER SMARTMIX MINI (MISCELLANEOUS) IMPLANT

## 2021-03-02 NOTE — Transfer of Care (Signed)
Immediate Anesthesia Transfer of Care Note  Patient: Carrie [name]  Procedure(s) Performed: REVERSE SHOULDER ARTHROPLASTY (Left Shoulder)  Patient Location: PACU  Anesthesia Type:GA combined with regional for post-op pain  Level of Consciousness: awake and oriented  Airway & Oxygen Therapy: Patient Spontanous Breathing and Patient connected to face mask oxygen  Post-op Assessment: Report given to RN and Post -op Vital signs reviewed and stable  Post vital signs: Reviewed and stable  Last Vitals:  Vitals Value Taken Time  BP 130/78 03/02/21 1143  Temp    Pulse 70 03/02/21 1144  Resp 15 03/02/21 1144  SpO2 98 % 03/02/21 1144  Vitals shown include unvalidated device data.  Last Pain:  Vitals:   03/02/21 0925  TempSrc:   PainSc: 0-No pain      Patients Stated Pain Goal: 2 (40/81/44 8185)  Complications: No complications documented.

## 2021-03-02 NOTE — Anesthesia Procedure Notes (Signed)
Procedure Name: Intubation Date/Time: 03/02/2021 10:08 AM Performed by: Sharlette Dense, CRNA Patient Re-evaluated:Patient Re-evaluated prior to induction Oxygen Delivery Method: Circle system utilized Preoxygenation: Pre-oxygenation with 100% oxygen Induction Type: IV induction, Cricoid Pressure applied and Rapid sequence Laryngoscope Size: Miller and 2 Grade View: Grade I Tube type: Oral Tube size: 7.5 mm Number of attempts: 1 Airway Equipment and Method: Stylet Placement Confirmation: ETT inserted through vocal cords under direct vision,  positive ETCO2 and breath sounds checked- equal and bilateral Secured at: 21 cm Tube secured with: Tape Dental Injury: Teeth and Oropharynx as per pre-operative assessment

## 2021-03-02 NOTE — Interval H&P Note (Signed)
History and Physical Interval Note:  03/02/2021 9:13 AM  Carrie [name]  has presented today for surgery, with the diagnosis of Left shoulder rotator cuff tear arthropathy.  The various methods of treatment have been discussed with the patient and family. After consideration of risks, benefits and other options for treatment, the patient has consented to  Procedure(s) with comments: REVERSE SHOULDER ARTHROPLASTY (Left) - with interscalene block as a surgical intervention.  The patient's history has been reviewed, patient examined, no change in status, stable for surgery.  I have reviewed the patient's chart and labs.  Questions were answered to the patient's satisfaction.     Augustin Schooling

## 2021-03-02 NOTE — Discharge Instructions (Signed)
Ice to the shoulder constantly.  Keep the incision covered and clean and dry for one week, then ok to get it wet in the shower. ° °Do exercise as instructed several times per day. ° °DO NOT reach behind your back or push up out of a chair with the operative arm. ° °Use a sling while you are up and around for comfort, may remove while seated.  Keep pillow propped behind the operative elbow. ° °Follow up with Dr Suttyn Cryder in two weeks in the office, call [phone] for appt °

## 2021-03-02 NOTE — Brief Op Note (Signed)
03/02/2021  11:56 AM  PATIENT:  Carrie [name]  80 y.o. female  PRE-OPERATIVE DIAGNOSIS:  Left shoulder rotator cuff tear arthropathy  POST-OPERATIVE DIAGNOSIS:  Left shoulder rotator cuff tear arthropathy  PROCEDURE:  Procedure(s): REVERSE SHOULDER ARTHROPLASTY (Left)  DePuy Delta Xtend with no subscap repair  SURGEON:  Surgeon(s) and Role:    Netta Cedars, MD - Primary  PHYSICIAN ASSISTANT:   ASSISTANTS: Ventura Bruns, PA-C   ANESTHESIA:   regional and general  EBL:  50 mL   BLOOD ADMINISTERED:none  DRAINS: none   LOCAL MEDICATIONS USED:  MARCAINE     SPECIMEN:  No Specimen  DISPOSITION OF SPECIMEN:  N/A  COUNTS:  YES  TOURNIQUET:  * No tourniquets in log *  DICTATION: .Other Dictation: Dictation Number [id number]  PLAN OF CARE: Admit for overnight observation  PATIENT DISPOSITION:  PACU - hemodynamically stable.   Delay start of Pharmacological VTE agent (>24hrs) due to surgical blood loss or risk of bleeding: not applicable

## 2021-03-02 NOTE — Anesthesia Procedure Notes (Signed)
Anesthesia Regional Block: Interscalene brachial plexus block   Pre-Anesthetic Checklist: ,, timeout performed, Correct Patient, Correct Site, Correct Laterality, Correct Procedure, Correct Position, site marked, Risks and benefits discussed,  Surgical consent,  Pre-op evaluation,  At surgeon's request and post-op pain management  Laterality: Left  Prep: Maximum Sterile Barrier Precautions used, chloraprep       Needles:  Injection technique: Single-shot  Needle Type: Echogenic Stimulator Needle     Needle Length: 4cm  Needle Gauge: 22     Additional Needles:   Procedures:,,,, ultrasound used (permanent image in chart),,,,  Narrative:  Start time: 03/02/2021 9:15 AM End time: 03/02/2021 9:21 AM Injection made incrementally with aspirations every 5 mL.  Performed by: Personally  Anesthesiologist: Freddrick March, MD  Additional Notes: Monitors applied. No increased pain on injection. No increased resistance to injection. Injection made in 5cc increments. Good needle visualization. Patient tolerated procedure well.

## 2021-03-02 NOTE — Progress Notes (Signed)
Time out completed AssistedDr. Hulan Fray with left, ultrasound guided, interscalene  block. Side rails up, monitors on throughout procedure. See vital signs in flow sheet. Tolerated Procedure well.

## 2021-03-03 DIAGNOSIS — M19012 Primary osteoarthritis, left shoulder: Secondary | ICD-10-CM | POA: Diagnosis not present

## 2021-03-03 LAB — BASIC METABOLIC PANEL
Anion gap: 8 (ref 5–15)
BUN: 29 mg/dL — ABNORMAL HIGH (ref 8–23)
CO2: 25 mmol/L (ref 22–32)
Calcium: 8.3 mg/dL — ABNORMAL LOW (ref 8.9–10.3)
Chloride: 106 mmol/L (ref 98–111)
Creatinine, Ser: 1.04 mg/dL — ABNORMAL HIGH (ref 0.44–1.00)
GFR, Estimated: 55 mL/min — ABNORMAL LOW (ref >60)
Glucose, Bld: 220 mg/dL — ABNORMAL HIGH (ref 70–99)
Potassium: 4.1 mmol/L (ref 3.5–5.1)
Sodium: 139 mmol/L (ref 135–145)

## 2021-03-03 LAB — HEMOGLOBIN AND HEMATOCRIT, BLOOD
HCT: 31.9 % — ABNORMAL LOW (ref 36.0–46.0)
Hemoglobin: 10.3 g/dL — ABNORMAL LOW (ref 12.0–15.0)

## 2021-03-03 NOTE — Progress Notes (Signed)
Orthopedics Progress Note  Subjective: Stable overnight. Minimal pain  Objective:  Vitals:   03/03/21 0130 03/03/21 0500  BP: (!) 173/66 (!) 148/71  Pulse: 79 84  Resp: 16 16  Temp: 98.3 F (36.8 C) 98 F (36.7 C)  SpO2: 94% 92%    General: Awake and alert  Musculoskeletal: Left shoulder wound looks good, dressing changed. Some ecchymosis and mild swelling Neurovascularly intact  Lab Results  Component Value Date   WBC 4.8 02/26/2021   HGB 10.3 (L) 03/03/2021   HCT 31.9 (L) 03/03/2021   MCV 98.0 02/26/2021   PLT 170 02/26/2021       Component Value Date/Time   NA 139 03/03/2021 0326   NA 143 07/16/2019 1132   NA 142 08/29/2017 0838   K 4.1 03/03/2021 0326   K 4.2 08/29/2017 0838   CL 106 03/03/2021 0326   CO2 25 03/03/2021 0326   CO2 23 08/29/2017 0838   GLUCOSE 220 (H) 03/03/2021 0326   GLUCOSE 97 08/29/2017 0838   BUN 29 (H) 03/03/2021 0326   BUN 27 07/16/2019 1132   BUN 24.0 08/29/2017 0838   CREATININE 1.04 (H) 03/03/2021 0326   CREATININE 1.21 (H) 02/02/2021 0855   CREATININE 1.0 08/29/2017 0838   CALCIUM 8.3 (L) 03/03/2021 0326   CALCIUM 8.9 08/29/2017 0838   GFRNONAA 55 (L) 03/03/2021 0326   GFRNONAA 46 (L) 02/02/2021 0855   GFRAA 58 (L) 05/26/2020 1103   GFRAA 39 (L) 07/30/2019 1010    Lab Results  Component Value Date   INR 1.5 (H) 02/16/2019   INR 1.03 08/29/2018   INR 1.00 12/17/2009    Assessment/Plan: POD #1 s/p Procedure(s): REVERSE SHOULDER ARTHROPLASTY Home after therapy Follow up in two weeks in the office  Carrie Lipps R. Veverly Fells, MD 03/03/2021 7:31 AM

## 2021-03-03 NOTE — Anesthesia Postprocedure Evaluation (Signed)
Anesthesia Post Note  Patient: Carrie [name]  Procedure(s) Performed: REVERSE SHOULDER ARTHROPLASTY (Left Shoulder)     Patient location during evaluation: PACU Anesthesia Type: Regional and General Level of consciousness: awake and alert Pain management: pain level controlled Vital Signs Assessment: post-procedure vital signs reviewed and stable Respiratory status: spontaneous breathing, nonlabored ventilation, respiratory function stable and patient connected to nasal cannula oxygen Cardiovascular status: blood pressure returned to baseline and stable Postop Assessment: no apparent nausea or vomiting Anesthetic complications: no   No complications documented.  Last Vitals:  Vitals:   03/03/21 0500 03/03/21 1014  BP: (!) 148/71 116/68  Pulse: 84 70  Resp: 16 18  Temp: 36.7 C   SpO2: 92% 96%    Last Pain:  Vitals:   03/03/21 0819  TempSrc:   PainSc: 3    Pain Goal: Patients Stated Pain Goal: 2 (03/02/21 0754)                 Haywood Lasso L Liliann File

## 2021-03-03 NOTE — Evaluation (Signed)
Occupational Therapy Evaluation Patient Details Name: Carrie [name] MRN: [id number] DOB: 09-Oct-1941 Today's Date: 03/03/2021    History of Present Illness Pt is a 80 year old female who underwent Left reverse shoulder replacement using DePuy Delta Xtend prosthesis with no subscapularis repair on 03/02/2021.   Clinical Impression   Pt is a 80 year old female, s/p shoulder replacement without functional use of non-dominant LT upper extremity secondary to effects of surgery and interscalene block and shoulder precautions. Therapist provided education and instruction to patient and spouse in regards to exercises, precautions, positioning, donning upper extremity clothing and bathing while maintaining shoulder precautions, ice and edema management and donning/doffing sling. Patient and daughter verbalized understanding and demonstrated as needed. Patient needed assistance to donn shirt, underwear, pants, and shoes and provided with instruction on compensatory strategies to perform ADLs. Patient limited by decreased ROM in LT shoulder so therefore will need some form of assistance at home which is available 24/7. Patient and daughter verbalized and/or demonstrated understanding to all instruction. Patient to follow up with MD for further therapy needs.     Follow Up Recommendations  Follow surgeon's recommendation for DC plan and follow-up therapies    Equipment Recommendations  None recommended by OT    Recommendations for Other Services       Precautions / Restrictions Precautions Precautions: Shoulder Shoulder Interventions: At all times;Off for dressing/bathing/exercises;Shoulder sling/immobilizer Precaution Booklet Issued: Yes (comment) Precaution Comments: No P/AROM to LUE. rTSA precautions. Required Braces or Orthoses: Sling Restrictions Weight Bearing Restrictions: Yes LUE Weight Bearing: Non weight bearing Other Position/Activity Restrictions: NWB LUE      Mobility Bed  Mobility Overal bed mobility: Modified Independent             General bed mobility comments: HOB raised.    Transfers Overall transfer level: Modified independent                    Balance Overall balance assessment: Mild deficits observed, not formally tested;History of Falls                                         ADL either performed or assessed with clinical judgement   ADL Overall ADL's : Needs assistance/impaired Eating/Feeding: Set up   Grooming: Standing;Sitting;Set up   Upper Body Bathing: Moderate assistance;Sitting;With caregiver independent assisting;Adhering to UE precautions   Lower Body Bathing: Min guard;Sit to/from stand   Upper Body Dressing : Moderate assistance;Sitting;Cueing for UE precautions;With caregiver independent assisting   Lower Body Dressing: Min guard;Sit to/from stand;Sitting/lateral leans;With caregiver independent assisting   Toilet Transfer: Supervision/safety;Comfort height toilet   Toileting- Clothing Manipulation and Hygiene: Min guard;Sitting/lateral lean;Sit to/from stand;With caregiver independent assisting       Functional mobility during ADLs: Supervision/safety       Vision Baseline Vision/History: Wears glasses Wears Glasses: At all times Patient Visual Report: No change from baseline       Perception     Praxis      Pertinent Vitals/Pain Pain Assessment: No/denies pain (lingering numbness)     Hand Dominance Right   Extremity/Trunk Assessment Upper Extremity Assessment Upper Extremity Assessment: LUE deficits/detail;RUE deficits/detail RUE Deficits / Details: WFL RUE Sensation: WNL RUE Coordination: WNL LUE Deficits / Details: Wrist and hand ROM has returned. Pt still mostly numb with no active elbow flex yet. LUE: Unable to fully assess due to immobilization LUE Sensation:  decreased light touch LUE Coordination: decreased fine motor (due to residual numbness)            Communication Communication Communication: No difficulties   Cognition Arousal/Alertness: Awake/alert Behavior During Therapy: WFL for tasks assessed/performed Overall Cognitive Status: Within Functional Limits for tasks assessed                                     General Comments       Exercises Shoulder Exercises Elbow Flexion: PROM;Standing;Left;10 reps Elbow Extension: AAROM;Standing;Left;10 reps Wrist Flexion: Seated;AROM;10 reps;Left Wrist Extension: AROM;Seated;10 reps;Left Digit Composite Flexion: AROM;Seated;Left;10 reps Composite Extension: Seated;AROM;10 reps;Left Neck Flexion: AROM;10 reps;Seated Neck Extension: AROM;10 reps;Seated Neck Lateral Flexion - Right: AROM;10 reps;Seated Neck Lateral Flexion - Left: AROM;10 reps;Seated Hand Exercises Forearm Supination: AAROM;Left;10 reps;Seated Forearm Pronation: AROM;Left;10 reps;Seated   Shoulder Instructions Shoulder Instructions Donning/doffing shirt without moving shoulder: Moderate assistance;Patient able to independently direct caregiver;Caregiver independent with task Method for sponge bathing under operated UE: Maximal assistance;Patient able to independently direct caregiver;Caregiver independent with task Donning/doffing sling/immobilizer: Minimal assistance;Patient able to independently direct caregiver;Caregiver independent with task Correct positioning of sling/immobilizer: Caregiver independent with task;Patient able to independently direct caregiver;Supervision/safety ROM for elbow, wrist and digits of operated UE: Supervision/safety;Maximal assistance;Caregiver independent with task;Patient able to independently direct caregiver (Max As for elbow due to residual post-op numbness.) Sling wearing schedule (on at all times/off for ADL's): Independent;Caregiver independent with task;Patient able to independently direct caregiver Proper positioning of operated UE when showering:  Supervision/safety;Patient able to independently direct caregiver;Caregiver independent with task Dressing change: Caregiver independent with task;Patient able to independently direct caregiver Positioning of UE while sleeping: Independent;Caregiver independent with task;Patient able to independently direct caregiver    Home Living Family/patient expects to be discharged to:: Private residence Living Arrangements: Alone Available Help at Discharge: Family;Friend(s);Available 24 hours/day (Pt's daughter who is a trained CNA will be with pt 24/7 until Thursday. After this pt's significant other will be available 24/7.) Type of Home: House Home Access: Ramped entrance     Home Layout: One level     Bathroom Shower/Tub: Occupational psychologist: Handicapped height     Home Equipment: Environmental consultant - 2 wheels;Cane - single point;Grab bars - tub/shower;Grab bars - toilet;Wheelchair - Press photographer;Shower seat - built in   Additional Comments: Pt has not needed to use DME. she has them in storage. they were used for her late husband. Pt also lift recliner.      Prior Functioning/Environment Level of Independence: Independent        Comments: Pt endorses 2 falls in last 6 months.        OT Problem List: Decreased strength;Decreased range of motion;Impaired sensation      OT Treatment/Interventions:      OT Goals(Current goals can be found in the care plan section) Acute Rehab OT Goals Patient Stated Goal: to go home today. OT Goal Formulation: With patient/family Potential to Achieve Goals: Good ADL Goals Additional ADL Goal #1: Pt will demonstrate UE/LE dressing, donning/doffing of sling, correct positioning of LT UE, and compensatory strategies for LT axilla hygiene, all while correctly following all shoulder post-op precautions/restrictions.  OT Frequency:     Barriers to D/C:            Co-evaluation              AM-PAC OT "6 Clicks" Daily Activity  Outcome Measure Help from another person eating meals?: A Little Help from another person taking care of personal grooming?: A Little Help from another person toileting, which includes using toliet, bedpan, or urinal?: A Little Help from another person bathing (including washing, rinsing, drying)?: A Lot Help from another person to put on and taking off regular upper body clothing?: A Lot Help from another person to put on and taking off regular lower body clothing?: A Little 6 Click Score: 16   End of Session Equipment Utilized During Treatment:  (sling) Nurse Communication:  (OT completed. Pt ready for home from OT perspective.)  Activity Tolerance: Patient tolerated treatment well Patient left: with family/visitor present;with call bell/phone within reach (EOB with daughter)  OT Visit Diagnosis: History of falling (Z91.81);Muscle weakness (generalized) (M62.81)                Time: 6384-6659 OT Time Calculation (min): 36 min Charges:  OT General Charges $OT Visit: 1 Visit OT Evaluation $OT Eval Low Complexity: 1 Low OT Treatments $Self Care/Home Management : 8-22 mins  Anderson Malta, OT Acute Rehab Services Office: [phone] 03/03/2021  Julien Girt 03/03/2021, 10:22 AM

## 2021-03-03 NOTE — Discharge Summary (Signed)
In most cases prophylactic antibiotics for Dental procdeures after total joint surgery are not necessary.  Exceptions are as follows:  1. History of prior total joint infection  2. Severely immunocompromised (Organ Transplant, cancer chemotherapy, Rheumatoid biologic meds such as [place])  3. Poorly controlled diabetes (A1C &gt; 8.0, blood glucose over 200)  If you have one of these conditions, contact your surgeon for an antibiotic prescription, prior to your dental procedure. Orthopedic Discharge Summary        Physician Discharge Summary  Patient ID: Carrie [name] MRN: [id number] DOB/AGE: 09/03/1941 80 y.o.  Admit date: 03/02/2021 Discharge date: 03/03/2021   Procedures:  Procedure(s) (LRB): REVERSE SHOULDER ARTHROPLASTY (Left)  Attending Physician:  Dr. Esmond Plants  Admission Diagnoses:   Left shoulder rotator cuff tear arthropathy   Discharge Diagnoses:  same   Past Medical History:  Diagnosis Date  . A-fib ([place])   . Anemia    pt denies   . Atrial fibrillation with rapid ventricular response ([place]) 06/25/2015  . Carpal tunnel syndrome, bilateral    pt denies   . Cervical spondylosis without myelopathy 10/25/2013  . Dental bridge present   . DJD (degenerative joint disease)   . Dyspnea    sometimes hx of cancer non hodgkins lymphoma   . Dysrhythmia    WENT INTO A FIB IN 2017   . Elevated troponin 02/18/2015  . Follicular lymphoma grade 3a ([place]) 02/03/2015   hx of nonhodgkins lymphoma x 3   . Headache    sometimes a headache   . History of echocardiogram    Echo 12/16: EF 60-65%, no RWMA, severe LAE  . History of nuclear stress test    Myoview 1/17: EF 55%, Normal study. No ischemia or scar.  . Hypertension   . Hypothyroid   . Nausea without vomiting 06/20/2015  . Stage III chronic kidney disease ([place]) 07/01/2015  . Thrush of mouth and esophagus ([place]) 06/25/2015    PCP: [name]   Discharged Condition: good  Hospital Course:  Patient  underwent the above stated procedure on 03/02/2021. Patient tolerated the procedure well and brought to the recovery room in good condition and subsequently to the floor. Patient had an uncomplicated hospital course and was stable for discharge.   Disposition: Discharge disposition: 01-Home or Self Care      with follow up in 2 weeks    Follow-up Information    Netta Cedars, MD. Call in 2 weeks.   Specialty: Orthopedic Surgery Why: [phone] Contact information: [address] [place] [address] [phone]               Dental Antibiotics:  In most cases prophylactic antibiotics for Dental procdeures after total joint surgery are not necessary.  Exceptions are as follows:  1. History of prior total joint infection  2. Severely immunocompromised (Organ Transplant, cancer chemotherapy, Rheumatoid biologic meds such as [place])  3. Poorly controlled diabetes (A1C &gt; 8.0, blood glucose over 200)  If you have one of these conditions, contact your surgeon for an antibiotic prescription, prior to your dental procedure.  Discharge Instructions    Call MD / Call 911   Complete by: As directed    If you experience chest pain or shortness of breath, CALL 911 and be transported to the hospital emergency room.  If you develope a fever above 101 F, pus (white drainage) or increased drainage or redness at the wound, or calf pain, call your surgeon's office.  Constipation Prevention   Complete by: As directed    Drink plenty of fluids.  Prune juice may be helpful.  You may use a stool softener, such as Colace (over the counter) 100 mg twice a day.  Use MiraLax (over the counter) for constipation as needed.   Diet - low sodium heart healthy   Complete by: As directed    Increase activity slowly as tolerated   Complete by: As directed    Post-operative opioid taper instructions:   Complete by: As directed    POST-OPERATIVE OPIOID TAPER INSTRUCTIONS: It  is important to wean off of your opioid medication as soon as possible. If you do not need pain medication after your surgery it is ok to stop day one. Opioids include: Codeine, Hydrocodone(Norco, Vicodin), Oxycodone(Percocet, oxycontin) and hydromorphone amongst others.  Long term and even short term use of opiods can cause: Increased pain response Dependence Constipation Depression Respiratory depression And more.  Withdrawal symptoms can include Flu like symptoms Nausea, vomiting And more Techniques to manage these symptoms Hydrate well Eat regular healthy meals Stay active Use relaxation techniques(deep breathing, meditating, yoga) Do Not substitute Alcohol to help with tapering If you have been on opioids for less than two weeks and do not have pain than it is ok to stop all together.  Plan to wean off of opioids This plan should start within one week post op of your joint replacement. Maintain the same interval or time between taking each dose and first decrease the dose.  Cut the total daily intake of opioids by one tablet each day Next start to increase the time between doses. The last dose that should be eliminated is the evening dose.         Allergies as of 03/03/2021      Reactions   Amiodarone Other (See Comments)   Hyperthyroidism       Medication List    STOP taking these medications   acyclovir 400 MG tablet Commonly known as: ZOVIRAX   lidocaine-prilocaine cream Commonly known as: EMLA   prochlorperazine 10 MG tablet Commonly known as: COMPAZINE     TAKE these medications   acetaminophen 500 MG tablet Commonly known as: TYLENOL Take 500 mg by mouth every 6 (six) hours as needed for moderate pain or headache.   diclofenac sodium 1 % Gel Commonly known as: VOLTAREN Apply 2 g topically 3 (three) times daily as needed (knee pain).   Eliquis 5 MG Tabs tablet Generic drug: apixaban Take 5 mg by mouth 2 (two) times daily.   levothyroxine 112  MCG tablet Commonly known as: SYNTHROID Take 112 mcg by mouth daily before breakfast.   metoprolol succinate 25 MG 24 hr tablet Commonly known as: TOPROL-XL Take 0.5 tablets (12.5 mg total) by mouth daily. Take with or immediately following a meal.   ondansetron 8 MG tablet Commonly known as: Zofran Take 1 tablet (8 mg total) by mouth every 8 (eight) hours as needed for refractory nausea / vomiting. Start on day 2 after bendamustine chemo.   oxyCODONE-acetaminophen 5-325 MG tablet Commonly known as: Percocet Take 0.5-1 tablets by mouth every 6 (six) hours as needed for moderate pain or severe pain.   polyvinyl alcohol 1.4 % ophthalmic solution Commonly known as: LIQUIFILM TEARS Place 1 drop into both eyes as needed for dry eyes.         Signed: Augustin Schooling 03/03/2021, 7:33 AM  [facility] is now [facility]  [address], [place], [place] [address] Phone: [place]

## 2021-03-03 NOTE — Op Note (Signed)
Carrie, [name] MEDICAL RECORD NO: [id number] ACCOUNT NO: [id number] DATE OF BIRTH: [birth year] FACILITY: [facility] LOCATION: [id number] PHYSICIAN: Doran Heater. Veverly Fells, MD  Operative Report   DATE OF PROCEDURE: 03/02/2021   PREOPERATIVE DIAGNOSIS:  Left shoulder rotator cuff tear arthropathy.  POSTOPERATIVE DIAGNOSIS:  Left shoulder rotator cuff tear arthropathy.  PROCEDURE PERFORMED:  Left reverse shoulder replacement using DePuy Delta Xtend prosthesis with no subscapularis repair.  ATTENDING SURGEON:  Doran Heater. Veverly Fells, MD.  ASSISTANT:  Charletta Cousin [place], [place], who was scrubbed during the entire procedure, and necessary for satisfactory completion of surgery.    General anesthesia was used plus interscalene block.  ESTIMATED BLOOD LOSS:  Less than 100 mL  FLUID REPLACEMENT:  1500 mL crystalloid.    Instrument count was correct, there were no complications, perioperative antibiotics were given.  INDICATIONS:  The patient is a 80 year old female with a history of worsening left shoulder pain and dysfunction secondary to rotator cuff tear arthropathy.  The patient has failed conservative management, desires operative treatment to restore function  and eliminate pain.  Informed consent was obtained.  DESCRIPTION OF PROCEDURE:  After an adequate level of anesthesia was achieved, the patient was positioned in the modified beach chair position.  Left shoulder correctly identified and sterilely prepped and draped in the usual manner.  Timeout called,  verifying correct patient, correct site.  We entered the patient's shoulder using a standard deltopectoral incision starting at the coracoid process and extending down to the anterior humerus.  Dissection down through the subcutaneous tissues using  Bovie.  We identified the cephalic vein and took it laterally with the deltoid.  Pectoralis was taken medially.  Conjoined tendon identified and retracted medially.  We did not find a biceps tenodesis.   She had previously ruptured that, but I did go  ahead and released the subscapularis remnant, which was thin and not repairable off the lesser tuberosity.  We did tag for retraction and protection of the axillary nerve.  We did an inferior capsular release progressively externally rotating the humerus  and delivered the humeral head out of the wound.  We entered the proximal humerus with a 6 mm reamer, reaming up to a size 8.  We then placed our T-handled 8 intramedullary guide and resected the humeral head in 10 degrees of retroversion using an  oscillating saw.  We then subluxed the humerus posteriorly.  We gained good exposure to the glenoid face removing the capsule and the labrum.  We then went ahead and removed the remaining cartilage on the glenoid.  There was some cartilage there, removed  that with a Cobb elevator and a rongeur.  We got down to native bone.  We then drilled our central guide pin and then did our reaming for the metaglene baseplate with the powered reamer, we then did peripheral hand reaming by hand and then we drilled  out the central peg hole for the DePuy Delta extend reverse shoulder replacement baseplate.  We impacted the HA coated baseplate getting good bony support.  We then placed a 36 screw inferiorly, 36 screws at the base of the coracoid, we had good purchase  of both screws and locked them in the baseplate.  We could not get anterior and posterior screws because of just the size of the glenoid was fairly small, but we had good baseplate security.  We then took a 38 standard glenosphere and placed it on the  baseplate and secured it, we did a finger sweep to  make sure there was no soft tissue caught up in that and then we directed our attention back toward the humeral side.  We reamed for the one left metaphysis and then trialled with the 8 stem, 1 left  metaphysis set on the 0 setting and placed in 10 degrees of retroversion.  With that impacted in place we selected a  38+3 poly trial, we placed that on the humeral tray and then reduced the shoulder.  We were pleased with our soft tissue balancing.  No  gapping with inferior pole or external rotation and conjoined as appropriately tight.  I removed all the trial components from the humeral side.  The bone quality was not great, we did use available bone graft and used impaction grafting technique to  then place the HA coated press fit real stem with the 1 left metaphysis, again set on the 0 setting and placed in 10 degrees of retroversion.  Once we impacted that in place with bone graft and that really supplemented the fixation well and the fixation  of the real stem was excellent.  Then, we selected the 38+3 real poly and placed it on the humeral tray and impacted that.  We reduced the shoulder, had nice little pop as it reduced, a nice stable shoulder throughout a full range of motion, no  impingement.  We irrigated thoroughly, resected the subscap remnant and then repaired deltopectoral interval with 0 Vicryl suture followed by 2-0 Vicryl for subcutaneous closure and 4-0 Monocryl for skin.  Steri-Strips were applied followed by sterile  dressing.  The patient tolerated surgery well.      SUJ D: 03/02/2021 12:02:22 pm T: 03/03/2021 2:55:00 am  JOB: [id number]/ [id number]

## 2021-03-03 NOTE — Plan of Care (Signed)
Patient discharged home in stable condition 

## 2021-03-05 ENCOUNTER — Encounter (HOSPITAL_COMMUNITY): Payer: Self-pay | Admitting: Orthopedic Surgery

## 2021-03-13 ENCOUNTER — Ambulatory Visit: Payer: Medicare HMO | Admitting: Cardiology

## 2021-03-23 ENCOUNTER — Telehealth: Payer: Self-pay

## 2021-03-23 NOTE — Telephone Encounter (Signed)
She called and left a message to call her.  Called back. She recently had should replacement surgery and is canceling 6/14 appts. She does not think she can do treatment at this time. Appts canceled and she is requesting appt be rescheduled to the end of July to give her time to recover. Scheduling message sent to reschedule.  FYI

## 2021-03-24 ENCOUNTER — Telehealth: Payer: Self-pay

## 2021-03-24 NOTE — Telephone Encounter (Signed)
She called and is concerned today after canceling appts at [facility] yesterday and moving appt to the end of July. She is afraid her cancer is back and is requesting appt. She is having a bad day today. Scheduled appt with Dr. Alvy Bimler with labs on 6/21. She is aware of appt time.

## 2021-03-31 ENCOUNTER — Ambulatory Visit: Payer: No Typology Code available for payment source

## 2021-03-31 ENCOUNTER — Ambulatory Visit: Payer: No Typology Code available for payment source | Admitting: Hematology and Oncology

## 2021-03-31 ENCOUNTER — Other Ambulatory Visit: Payer: No Typology Code available for payment source

## 2021-04-07 ENCOUNTER — Encounter: Payer: Self-pay | Admitting: Hematology and Oncology

## 2021-04-07 ENCOUNTER — Other Ambulatory Visit: Payer: Self-pay

## 2021-04-07 ENCOUNTER — Inpatient Hospital Stay (HOSPITAL_BASED_OUTPATIENT_CLINIC_OR_DEPARTMENT_OTHER): Payer: Medicare HMO | Admitting: Hematology and Oncology

## 2021-04-07 ENCOUNTER — Inpatient Hospital Stay: Payer: Medicare HMO | Attending: Hematology and Oncology

## 2021-04-07 DIAGNOSIS — Z7901 Long term (current) use of anticoagulants: Secondary | ICD-10-CM | POA: Diagnosis not present

## 2021-04-07 DIAGNOSIS — Z7189 Other specified counseling: Secondary | ICD-10-CM

## 2021-04-07 DIAGNOSIS — C8238 Follicular lymphoma grade IIIa, lymph nodes of multiple sites: Secondary | ICD-10-CM | POA: Insufficient documentation

## 2021-04-07 DIAGNOSIS — D638 Anemia in other chronic diseases classified elsewhere: Secondary | ICD-10-CM

## 2021-04-07 DIAGNOSIS — Z79899 Other long term (current) drug therapy: Secondary | ICD-10-CM | POA: Diagnosis not present

## 2021-04-07 DIAGNOSIS — Z9221 Personal history of antineoplastic chemotherapy: Secondary | ICD-10-CM | POA: Diagnosis not present

## 2021-04-07 DIAGNOSIS — R531 Weakness: Secondary | ICD-10-CM | POA: Diagnosis not present

## 2021-04-07 DIAGNOSIS — R5383 Other fatigue: Secondary | ICD-10-CM

## 2021-04-07 DIAGNOSIS — D539 Nutritional anemia, unspecified: Secondary | ICD-10-CM

## 2021-04-07 LAB — CMP (CANCER CENTER ONLY)
ALT: 14 U/L (ref 0–44)
AST: 19 U/L (ref 15–41)
Albumin: 4.2 g/dL (ref 3.5–5.0)
Alkaline Phosphatase: 89 U/L (ref 38–126)
Anion gap: 7 (ref 5–15)
BUN: 34 mg/dL — ABNORMAL HIGH (ref 8–23)
CO2: 26 mmol/L (ref 22–32)
Calcium: 9.2 mg/dL (ref 8.9–10.3)
Chloride: 106 mmol/L (ref 98–111)
Creatinine: 1.07 mg/dL — ABNORMAL HIGH (ref 0.44–1.00)
GFR, Estimated: 53 mL/min — ABNORMAL LOW (ref >60)
Glucose, Bld: 85 mg/dL (ref 70–99)
Potassium: 4 mmol/L (ref 3.5–5.1)
Sodium: 139 mmol/L (ref 135–145)
Total Bilirubin: 0.6 mg/dL (ref 0.3–1.2)
Total Protein: 6.8 g/dL (ref 6.5–8.1)

## 2021-04-07 LAB — CBC WITH DIFFERENTIAL (CANCER CENTER ONLY)
Abs Immature Granulocytes: 0.31 10*3/uL — ABNORMAL HIGH (ref 0.00–0.07)
Basophils Absolute: 0 10*3/uL (ref 0.0–0.1)
Basophils Relative: 1 %
Eosinophils Absolute: 0.3 10*3/uL (ref 0.0–0.5)
Eosinophils Relative: 5 %
HCT: 35 % — ABNORMAL LOW (ref 36.0–46.0)
Hemoglobin: 11.6 g/dL — ABNORMAL LOW (ref 12.0–15.0)
Immature Granulocytes: 6 %
Lymphocytes Relative: 10 %
Lymphs Abs: 0.5 10*3/uL — ABNORMAL LOW (ref 0.7–4.0)
MCH: 30.7 pg (ref 26.0–34.0)
MCHC: 33.1 g/dL (ref 30.0–36.0)
MCV: 92.6 fL (ref 80.0–100.0)
Monocytes Absolute: 0.6 10*3/uL (ref 0.1–1.0)
Monocytes Relative: 13 %
Neutro Abs: 3.3 10*3/uL (ref 1.7–7.7)
Neutrophils Relative %: 65 %
Platelet Count: 201 10*3/uL (ref 150–400)
RBC: 3.78 MIL/uL — ABNORMAL LOW (ref 3.87–5.11)
RDW: 12.8 % (ref 11.5–15.5)
WBC Count: 5 10*3/uL (ref 4.0–10.5)
WBC Morphology: INCREASED
nRBC: 0 % (ref 0.0–0.2)

## 2021-04-07 LAB — VITAMIN B12: Vitamin B-12: 1143 pg/mL — ABNORMAL HIGH (ref 180–914)

## 2021-04-07 LAB — URIC ACID: Uric Acid, Serum: 6.4 mg/dL (ref 2.5–7.1)

## 2021-04-07 MED ORDER — LIDOCAINE-PRILOCAINE 2.5-2.5 % EX CREA: 1.0000 "application " | TOPICAL_CREAM | Freq: Every day | CUTANEOUS | 3 refills | Status: AC | PRN

## 2021-04-07 MED ORDER — SODIUM CHLORIDE 0.9% FLUSH
10.0000 mL | Freq: Once | INTRAVENOUS | Status: AC
  Administered 2021-04-07: 10 mL
  Filled 2021-04-07: qty 10

## 2021-04-07 MED ORDER — HEPARIN SOD (PORK) LOCK FLUSH 100 UNIT/ML IV SOLN
500.0000 [IU] | Freq: Once | INTRAVENOUS | Status: AC
Start: 2021-04-07 — End: 2021-04-07
  Administered 2021-04-07: 500 [IU]
  Filled 2021-04-07: qty 5

## 2021-04-07 NOTE — Assessment & Plan Note (Signed)
She has excessive fatigue likely due to deconditioning She also had poor sleep in the range sleep pattern in general I recommend also close follow-up with her primary care doctor and make sure she has recent TSH monitoring since she is on thyroid replacement therapy I reassured her that her mild anemia is not the cause of her excessive fatigue

## 2021-04-07 NOTE — Assessment & Plan Note (Signed)
The cause of her anemia is likely due to anemia of chronic illness  Her B12 level is normal She is reassured

## 2021-04-07 NOTE — Assessment & Plan Note (Signed)
Her blood work is unremarkable except for mild anemia which is not unexpected given her recent surgery I told the patient and family that she has been off treatment for some time; her last imaging studies show no evidence of disease Overall, her symptoms are not due to her disease or treatment I reassured her We will proceed with treatment next month as scheduled

## 2021-04-07 NOTE — Progress Notes (Signed)
[facility] OFFICE PROGRESS NOTE  Patient Care Team: Clinic, [name] as PCP - General [name], MD (Inactive) as PCP - Cardiology (Cardiology) [name], MD as Attending Physician (Internal Medicine)  ASSESSMENT & PLAN:  Grade 3a follicular lymphoma of lymph nodes of multiple regions [facility]) Her blood work is unremarkable except for mild anemia which is not unexpected given her recent surgery I told the patient and family that she has been off treatment for some time; her last imaging studies show no evidence of disease Overall, her symptoms are not due to her disease or treatment I reassured her We will proceed with treatment next month as scheduled  Anemia of chronic disease The cause of her anemia is likely due to anemia of chronic illness  Her B12 level is normal She is reassured  Generalized weakness I believe her generalized weakness is due to deconditioning due to multiple surgeries and treatment I recommend physical therapy and rehab I encouraged her to discuss this with her orthopedic surgeon after her next visit  Other fatigue She has excessive fatigue likely due to deconditioning She also had poor sleep in the range sleep pattern in general I recommend also close follow-up with her primary care doctor and make sure she has recent TSH monitoring since she is on thyroid replacement therapy I reassured her that her mild anemia is not the cause of her excessive fatigue  No orders of the defined types were placed in this encounter.   All questions were answered. The patient knows to call the clinic with any problems, questions or concerns. The total time spent in the appointment was 30 minutes encounter with patients including review of chart and various tests results, discussions about plan of care and coordination of care plan   [name], MD 04/07/2021 2:12 PM  INTERVAL HISTORY: Please see below for problem oriented  charting. She is here accompanied by her husband.  Her son is available to collaborate her history over the phone Her patient appears extremely anxious She felt excessively fatigue and unable to do much activities of daily living She felt weak overall She has poor sleep pattern She does not have a fixed sleep schedule and wakes up several times at night to go to the bathroom to urinate She also sleep during daytime to catch up with her poor sleep She has not been doing much since her recent orthopedic surgery No recent infection, fever or chills  SUMMARY OF ONCOLOGIC HISTORY: Oncology History  Grade 3a follicular lymphoma of lymph nodes of multiple regions ([place])  01/21/2015 Surgery   She underwent excisional lymph node biopsy that came back follicular lymphoma grade 3    01/21/2015 Pathology Results   Accession: [id number] biopsy confirmed follicular lymphoma    11/27/4707 Imaging   Echocardiogram showed ejection fraction of 55-60%    02/11/2015 Imaging    PET CT scan show possible splenic involvement and diffuse lymphadenopathy throughout    02/11/2015 Bone Marrow Biopsy    bone marrow biopsy was performed and is involved by lymphoma with translocation of igH/BCL2    02/13/2015 Procedure   She had port placement.    02/17/2015 - 06/02/2015 Chemotherapy   She received R-CHOP chemo x 6    02/17/2015 Adverse Reaction   She had mild infusion reaction with cycle 1 of treatment.    04/18/2015 Imaging   PET CT scan showed near complete response to treatment.    04/22/2015 Adverse Reaction   Vincristine dose was  reduced by 50% due to neuropathy from cycle 4 onwards    06/25/2015 - 07/06/2015 Hospital Admission   She was hospitalized for recent sepsis/bacteremia and a fib with RVR    07/11/2015 Imaging   repeat PEt scan showed complete response to Rx    07/14/2015 - 06/16/2017 Chemotherapy   She received maintenance Rituximab every 60 days   12/15/2015 Imaging   PET CT showed no  evidence of cancer recurrence    06/15/2016 PET scan   No evidence of active lymphoma on skullbase to thigh FDG PET scan. Small LEFT periaortic retroperitoneal lymph nodes without significant metabolic activity ( Deauville 1). No change from prior    03/24/2017 Imaging   1. Borderline enlarged abdominal retroperitoneal lymph nodes, stable. No new adenopathy in the chest, abdomen or pelvis. 2.  Aortic atherosclerosis (ICD10-170.0). 3. Enlarged pulmonary arteries, indicative of pulmonary arterial hypertension.    09/14/2017 PET scan   Stable exam. No evidence of active lymphoma or other acute findings. (Deauville score 1)    08/08/2018 PET scan   1. Evidence of progressive/recurrent disease, as evidenced by new hypermetabolic nodes and subcutaneous nodule/nodes throughout left chest and abdomen. (Deauville 5). 2. Likely physiologic right nasopharyngeal hypermetabolism. Recommend attention on follow-up. 3. Incidental findings, including pulmonary artery enlargement, suggesting pulmonary arterial hypertension, stable left lower lobe pulmonary nodule, and aortic Atherosclerosis (ICD10-I70.0).    08/29/2018 Initial Biopsy   Soft tissue simple excision left back: Follicular lymphoma grade 1-2 positive for CD20, PAX5, bcl-6, and bcl-2.  Ki-67 30%    09/11/2018 - 02/20/2019 Chemotherapy   The patient had Bendamustine and Rituximab    12/13/2018 Imaging   1. Response to therapy, as evidenced by resolution of left chest wall nodularity and decrease in size of small left axillary nodes. 2. No residual soft tissue thickening at the site of hypermetabolism along the posterior left eleventh rib. Resolution of adjacent subcutaneous nodularity. 3. No new or progressive disease identified. 4.  Aortic Atherosclerosis (ICD10-I70.0). 5. Bilateral pulmonary nodules, similar. 6. Trace air within the urinary bladder could be iatrogenic. Possible pericystic edema. Correlate with symptoms of cystitis and  recent instrumentation.    07/30/2019 Imaging   Ct chest, abdomen and pelvis No findings suspicious for active lymphoma in the chest, abdomen, or pelvis.   Small bilateral pulmonary nodules, unchanged, benign.   Again noted is trace nondependent gas within the bladder. Correlate for recent intervention.   07/09/2020 Imaging   MRI imaging performed at the [facility] on the left shoulder reviewed significant lymphadenopathy, worrisome for recurrent lymphoma   07/23/2020 Imaging   1. Recurrent lymphoma as evidenced by enlarging left pectoral and abdominal retroperitoneal lymph nodes. Small left supraclavicular lymph nodes are new. 2. Avascular necrosis in the right femoral head with secondary degenerative osteoarthritis in the right hip. 3.  Aortic atherosclerosis (ICD10-I70.0). 4. Enlarged pulmonic trunk, indicative of pulmonary arterial hypertension.     08/04/2020 -  Chemotherapy   The patient had Bendamustine and Rituxan   10/24/2020 Imaging   1. Resolution of left supraclavicular and left axillary adenopathy. No residual measurable disease suggesting an excellent response to treatment. 2. Significant interval improvement in retroperitoneal lymph nodes. No new or progressive findings. 3. Stable mild emphysematous changes. 4. Enlarged pulmonary arteries suggesting pulmonary hypertension. 5. Stable small low-attenuation splenic lesion, likely benign cyst. 6. Emphysema and aortic atherosclerosis.   02/02/2021 Imaging   1. Stable examination with unchanged small retroperitoneal lymph nodes. No new or enlarging pathologically enlarged lymph nodes in the chest,  abdomen or pelvis. 2. Stable enlargement of the central pulmonary arteries, which can be seen with pulmonary arterial hypertension. 3. Emphysema and aortic atherosclerosis.     03/23/2021 -  Chemotherapy    Patient is on Treatment Plan: NON-HODGKINS LYMPHOMA RITUXIMAB Q60D MAINTENANCE         REVIEW OF SYSTEMS:   Constitutional:  Denies fevers, chills or abnormal weight loss Eyes: Denies blurriness of vision Ears, nose, mouth, throat, and face: Denies mucositis or sore throat Respiratory: Denies cough, dyspnea or wheezes Cardiovascular: Denies palpitation, chest discomfort or lower extremity swelling Gastrointestinal:  Denies nausea, heartburn or change in bowel habits Skin: Denies abnormal skin rashes Lymphatics: Denies new lymphadenopathy or easy bruising Neurological:Denies numbness, tingling or new weaknesses Behavioral/Psych: Mood is stable, no new changes  All other systems were reviewed with the patient and are negative.  I have reviewed the past medical history, past surgical history, social history and family history with the patient and they are unchanged from previous note.  ALLERGIES:  is allergic to amiodarone.  MEDICATIONS:  Current Outpatient Medications  Medication Sig Dispense Refill   lidocaine-prilocaine (EMLA) cream Apply 1 application topically daily as needed. 30 g 3   acetaminophen (TYLENOL) 500 MG tablet Take 500 mg by mouth every 6 (six) hours as needed for moderate pain or headache.     apixaban (ELIQUIS) 5 MG TABS tablet Take 5 mg by mouth 2 (two) times daily.      diclofenac sodium (VOLTAREN) 1 % GEL Apply 2 g topically 3 (three) times daily as needed (knee pain). 100 g 0   levothyroxine (SYNTHROID, LEVOTHROID) 112 MCG tablet Take 112 mcg by mouth daily before breakfast.  9   metoprolol succinate (TOPROL-XL) 25 MG 24 hr tablet Take 0.5 tablets (12.5 mg total) by mouth daily. Take with or immediately following a meal. 90 tablet 3   ondansetron (ZOFRAN) 8 MG tablet Take 1 tablet (8 mg total) by mouth every 8 (eight) hours as needed for refractory nausea / vomiting. Start on day 2 after bendamustine chemo. (Patient not taking: Reported on 02/23/2021) 30 tablet 1   oxyCODONE-acetaminophen (PERCOCET) 5-325 MG tablet Take 0.5-1 tablets by mouth every 6 (six) hours as needed for moderate pain or  severe pain. 20 tablet 0   polyvinyl alcohol (LIQUIFILM TEARS) 1.4 % ophthalmic solution Place 1 drop into both eyes as needed for dry eyes.     No current facility-administered medications for this visit.   Facility-Administered Medications Ordered in Other Visits  Medication Dose Route Frequency Provider Last Rate Last Admin   ondansetron (ZOFRAN) 8 mg in sodium chloride 0.9 % 50 mL IVPB   Intravenous Once Rawlins Stuard, MD       sodium chloride 0.9 % injection 10 mL  10 mL Intravenous PRN Alvy Bimler, Deniz Eskridge, MD   10 mL at 04/05/17 1315   sodium chloride flush (NS) 0.9 % injection 10 mL  10 mL Intracatheter Once [name], MD        PHYSICAL EXAMINATION: ECOG PERFORMANCE STATUS: 1 - Symptomatic but completely ambulatory  Vitals:   04/07/21 1136  BP: 134/87  Pulse: 71  Resp: 17  SpO2: 91%   Filed Weights   04/07/21 1136  Weight: 140 lb 3.2 oz (63.6 kg)    GENERAL:alert, no distress and comfortable SKIN: skin color, texture, turgor are normal, no rashes or significant lesions EYES: normal, Conjunctiva are pink and non-injected, sclera clear OROPHARYNX:no exudate, no erythema and lips, buccal mucosa, and tongue normal  NECK: supple, thyroid normal size, non-tender, without nodularity LYMPH:  no palpable lymphadenopathy in the cervical, axillary or inguinal LUNGS: clear to auscultation and percussion with normal breathing effort HEART: regular rate & rhythm and no murmurs and no lower extremity edema ABDOMEN:abdomen soft, non-tender and normal bowel sounds Musculoskeletal:no cyanosis of digits and no clubbing  NEURO: alert & oriented x 3 with fluent speech, no focal motor/sensory deficits  LABORATORY DATA:  I have reviewed the data as listed    Component Value Date/Time   NA 139 04/07/2021 1113   NA 143 07/16/2019 1132   NA 142 08/29/2017 0838   K 4.0 04/07/2021 1113   K 4.2 08/29/2017 0838   CL 106 04/07/2021 1113   CO2 26 04/07/2021 1113   CO2 23 08/29/2017 0838   GLUCOSE  85 04/07/2021 1113   GLUCOSE 97 08/29/2017 0838   BUN 34 (H) 04/07/2021 1113   BUN 27 07/16/2019 1132   BUN 24.0 08/29/2017 0838   CREATININE 1.07 (H) 04/07/2021 1113   CREATININE 1.0 08/29/2017 0838   CALCIUM 9.2 04/07/2021 1113   CALCIUM 8.9 08/29/2017 0838   PROT 6.8 04/07/2021 1113   PROT 6.4 08/29/2017 0838   ALBUMIN 4.2 04/07/2021 1113   ALBUMIN 3.8 08/29/2017 0838   AST 19 04/07/2021 1113   AST 18 08/29/2017 0838   ALT 14 04/07/2021 1113   ALT 16 08/29/2017 0838   ALKPHOS 89 04/07/2021 1113   ALKPHOS 93 08/29/2017 0838   BILITOT 0.6 04/07/2021 1113   BILITOT 0.33 08/29/2017 0838   GFRNONAA 53 (L) 04/07/2021 1113   GFRAA 58 (L) 05/26/2020 1103   GFRAA 39 (L) 07/30/2019 1010    No results found for: SPEP, UPEP  Lab Results  Component Value Date   WBC 5.0 04/07/2021   NEUTROABS 3.3 04/07/2021   HGB 11.6 (L) 04/07/2021   HCT 35.0 (L) 04/07/2021   MCV 92.6 04/07/2021   PLT 201 04/07/2021      Chemistry      Component Value Date/Time   NA 139 04/07/2021 1113   NA 143 07/16/2019 1132   NA 142 08/29/2017 0838   K 4.0 04/07/2021 1113   K 4.2 08/29/2017 0838   CL 106 04/07/2021 1113   CO2 26 04/07/2021 1113   CO2 23 08/29/2017 0838   BUN 34 (H) 04/07/2021 1113   BUN 27 07/16/2019 1132   BUN 24.0 08/29/2017 0838   CREATININE 1.07 (H) 04/07/2021 1113   CREATININE 1.0 08/29/2017 0838      Component Value Date/Time   CALCIUM 9.2 04/07/2021 1113   CALCIUM 8.9 08/29/2017 0838   ALKPHOS 89 04/07/2021 1113   ALKPHOS 93 08/29/2017 0838   AST 19 04/07/2021 1113   AST 18 08/29/2017 0838   ALT 14 04/07/2021 1113   ALT 16 08/29/2017 0838   BILITOT 0.6 04/07/2021 1113   BILITOT 0.33 08/29/2017 0370

## 2021-04-07 NOTE — Assessment & Plan Note (Signed)
I believe her generalized weakness is due to deconditioning due to multiple surgeries and treatment I recommend physical therapy and rehab I encouraged her to discuss this with her orthopedic surgeon after her next visit

## 2021-04-07 NOTE — Patient Instructions (Signed)

## 2021-04-08 ENCOUNTER — Telehealth: Payer: Self-pay

## 2021-04-08 NOTE — Telephone Encounter (Signed)
This LPN called pt to report lab results pt Dr Alvy Bimler. Informed pt lab results we OK. Pt verbalized thanks and understanding and will call with any questions/concerns.

## 2021-04-15 ENCOUNTER — Telehealth: Payer: Self-pay

## 2021-04-15 NOTE — Telephone Encounter (Signed)
This nurse received a message from patient stating that she has not received her Emla cream and when she called she was being told they had no information on a refill request.  This nurse contacted [facility] and spoke with a rep that stated it was processed from shipment this morning and she should receive it in the mail in the next few days.  Patient acknowledged understanding and knows to call with any other questions or concerns.

## 2021-04-17 ENCOUNTER — Encounter: Payer: Self-pay | Admitting: Hematology and Oncology

## 2021-05-05 ENCOUNTER — Ambulatory Visit: Payer: Medicare HMO | Admitting: Cardiology

## 2021-05-11 ENCOUNTER — Encounter: Payer: Self-pay | Admitting: Hematology and Oncology

## 2021-05-11 ENCOUNTER — Other Ambulatory Visit: Payer: Self-pay

## 2021-05-11 ENCOUNTER — Inpatient Hospital Stay (HOSPITAL_BASED_OUTPATIENT_CLINIC_OR_DEPARTMENT_OTHER): Payer: No Typology Code available for payment source | Admitting: Hematology and Oncology

## 2021-05-11 ENCOUNTER — Inpatient Hospital Stay: Payer: No Typology Code available for payment source | Attending: Hematology and Oncology

## 2021-05-11 ENCOUNTER — Inpatient Hospital Stay: Payer: No Typology Code available for payment source

## 2021-05-11 VITALS — BP 152/85 | HR 63 | Temp 97.7°F | Resp 18

## 2021-05-11 DIAGNOSIS — I482 Chronic atrial fibrillation, unspecified: Secondary | ICD-10-CM | POA: Diagnosis not present

## 2021-05-11 DIAGNOSIS — D72819 Decreased white blood cell count, unspecified: Secondary | ICD-10-CM

## 2021-05-11 DIAGNOSIS — Z5112 Encounter for antineoplastic immunotherapy: Secondary | ICD-10-CM | POA: Diagnosis present

## 2021-05-11 DIAGNOSIS — Z7901 Long term (current) use of anticoagulants: Secondary | ICD-10-CM | POA: Diagnosis not present

## 2021-05-11 DIAGNOSIS — C8238 Follicular lymphoma grade IIIa, lymph nodes of multiple sites: Secondary | ICD-10-CM

## 2021-05-11 DIAGNOSIS — Z79899 Other long term (current) drug therapy: Secondary | ICD-10-CM | POA: Diagnosis not present

## 2021-05-11 DIAGNOSIS — Z7189 Other specified counseling: Secondary | ICD-10-CM

## 2021-05-11 LAB — CMP (CANCER CENTER ONLY)
ALT: 11 U/L (ref 0–44)
AST: 17 U/L (ref 15–41)
Albumin: 3.9 g/dL (ref 3.5–5.0)
Alkaline Phosphatase: 87 U/L (ref 38–126)
Anion gap: 10 (ref 5–15)
BUN: 24 mg/dL — ABNORMAL HIGH (ref 8–23)
CO2: 26 mmol/L (ref 22–32)
Calcium: 8.9 mg/dL (ref 8.9–10.3)
Chloride: 106 mmol/L (ref 98–111)
Creatinine: 1.27 mg/dL — ABNORMAL HIGH (ref 0.44–1.00)
GFR, Estimated: 43 mL/min — ABNORMAL LOW (ref >60)
Glucose, Bld: 124 mg/dL — ABNORMAL HIGH (ref 70–99)
Potassium: 4.1 mmol/L (ref 3.5–5.1)
Sodium: 142 mmol/L (ref 135–145)
Total Bilirubin: 0.6 mg/dL (ref 0.3–1.2)
Total Protein: 6.1 g/dL — ABNORMAL LOW (ref 6.5–8.1)

## 2021-05-11 LAB — CBC WITH DIFFERENTIAL (CANCER CENTER ONLY)
Abs Immature Granulocytes: 0.08 10*3/uL — ABNORMAL HIGH (ref 0.00–0.07)
Basophils Absolute: 0 10*3/uL (ref 0.0–0.1)
Basophils Relative: 0 %
Eosinophils Absolute: 0.2 10*3/uL (ref 0.0–0.5)
Eosinophils Relative: 5 %
HCT: 38.3 % (ref 36.0–46.0)
Hemoglobin: 12.3 g/dL (ref 12.0–15.0)
Immature Granulocytes: 2 %
Lymphocytes Relative: 7 %
Lymphs Abs: 0.3 10*3/uL — ABNORMAL LOW (ref 0.7–4.0)
MCH: 29.8 pg (ref 26.0–34.0)
MCHC: 32.1 g/dL (ref 30.0–36.0)
MCV: 92.7 fL (ref 80.0–100.0)
Monocytes Absolute: 0.3 10*3/uL (ref 0.1–1.0)
Monocytes Relative: 9 %
Neutro Abs: 2.8 10*3/uL (ref 1.7–7.7)
Neutrophils Relative %: 77 %
Platelet Count: 160 10*3/uL (ref 150–400)
RBC: 4.13 MIL/uL (ref 3.87–5.11)
RDW: 13.4 % (ref 11.5–15.5)
WBC Count: 3.7 10*3/uL — ABNORMAL LOW (ref 4.0–10.5)
nRBC: 0 % (ref 0.0–0.2)

## 2021-05-11 MED ORDER — SODIUM CHLORIDE 0.9% FLUSH
10.0000 mL | Freq: Once | INTRAVENOUS | Status: AC
  Administered 2021-05-11: 10 mL
  Filled 2021-05-11: qty 10

## 2021-05-11 MED ORDER — DIPHENHYDRAMINE HCL 25 MG PO CAPS
ORAL_CAPSULE | ORAL | Status: AC
  Filled 2021-05-11: qty 2

## 2021-05-11 MED ORDER — HEPARIN SOD (PORK) LOCK FLUSH 100 UNIT/ML IV SOLN
500.0000 [IU] | Freq: Once | INTRAVENOUS | Status: AC | PRN
  Administered 2021-05-11: 500 [IU]
  Filled 2021-05-11: qty 5

## 2021-05-11 MED ORDER — SODIUM CHLORIDE 0.9 % IV SOLN
Freq: Once | INTRAVENOUS | Status: AC
  Filled 2021-05-11: qty 250

## 2021-05-11 MED ORDER — ACETAMINOPHEN 325 MG PO TABS
ORAL_TABLET | ORAL | Status: AC
  Filled 2021-05-11: qty 2

## 2021-05-11 MED ORDER — SODIUM CHLORIDE 0.9% FLUSH
10.0000 mL | INTRAVENOUS | Status: DC | PRN
  Administered 2021-05-11: 10 mL
  Filled 2021-05-11: qty 10

## 2021-05-11 MED ORDER — DIPHENHYDRAMINE HCL 25 MG PO CAPS
50.0000 mg | ORAL_CAPSULE | Freq: Once | ORAL | Status: AC
  Administered 2021-05-11: 50 mg via ORAL

## 2021-05-11 MED ORDER — ACETAMINOPHEN 325 MG PO TABS
650.0000 mg | ORAL_TABLET | Freq: Once | ORAL | Status: AC
  Administered 2021-05-11: 650 mg via ORAL

## 2021-05-11 MED ORDER — SODIUM CHLORIDE 0.9 % IV SOLN
375.0000 mg/m2 | Freq: Once | INTRAVENOUS | Status: AC
  Administered 2021-05-11: 600 mg via INTRAVENOUS
  Filled 2021-05-11: qty 50

## 2021-05-11 NOTE — Assessment & Plan Note (Signed)
Her blood work and exam is benign However, she is at high risk for cancer recurrence I plan to repeat CT imaging before her next visit in September

## 2021-05-11 NOTE — Patient Instructions (Signed)
[facility] ONCOLOGY  Discharge Instructions: Thank you for choosing [facility] to provide your oncology and hematology care.   If you have a lab appointment with the [facility], please go directly to the [facility] and check in at the registration area.   Wear comfortable clothing and clothing appropriate for easy access to any Portacath or PICC line.   We strive to give you quality time with your provider. You may need to reschedule your appointment if you arrive late (15 or more minutes).  Arriving late affects you and other patients whose appointments are after yours.  Also, if you miss three or more appointments without notifying the office, you may be dismissed from the clinic at the provider's discretion.      For prescription refill requests, have your pharmacy contact our office and allow 72 hours for refills to be completed.    Today you received the following chemotherapy and/or immunotherapy agents rituxin    To help prevent nausea and vomiting after your treatment, we encourage you to take your nausea medication as directed.  BELOW ARE SYMPTOMS THAT SHOULD BE REPORTED IMMEDIATELY: *FEVER GREATER THAN 100.4 F (38 C) OR HIGHER *CHILLS OR SWEATING *NAUSEA AND VOMITING THAT IS NOT CONTROLLED WITH YOUR NAUSEA MEDICATION *UNUSUAL SHORTNESS OF BREATH *UNUSUAL BRUISING OR BLEEDING *URINARY PROBLEMS (pain or burning when urinating, or frequent urination) *BOWEL PROBLEMS (unusual diarrhea, constipation, pain near the anus) TENDERNESS IN MOUTH AND THROAT WITH OR WITHOUT PRESENCE OF ULCERS (sore throat, sores in mouth, or a toothache) UNUSUAL RASH, SWELLING OR PAIN  UNUSUAL VAGINAL DISCHARGE OR ITCHING   Items with * indicate a potential emergency and should be followed up as soon as possible or go to the Emergency Department if any problems should occur.  Please show the CHEMOTHERAPY ALERT CARD or IMMUNOTHERAPY ALERT CARD at check-in to the  Emergency Department and triage nurse.  Should you have questions after your visit or need to cancel or reschedule your appointment, please contact [facility]  Dept: [phone]  and follow the prompts.  Office hours are 8:00 a.m. to 4:30 p.m. Monday - Friday. Please note that voicemails left after 4:00 p.m. may not be returned until the following business day.  We are closed weekends and major holidays. You have access to a nurse at all times for urgent questions. Please call the main number to the clinic Dept: [phone] and follow the prompts.   For any non-urgent questions, you may also contact your provider using MyChart. We now offer e-Visits for anyone 38 and older to request care online for non-urgent symptoms. For details visit [email or web address].   Also download the MyChart app! Go to the app store, search "MyChart", open the app, select [facility], and log in with your MyChart username and password.  Due to Covid, a mask is required upon entering the hospital/clinic. If you do not have a mask, one will be given to you upon arrival. For doctor visits, patients may have 1 support person aged 36 or older with them. For treatment visits, patients cannot have anyone with them due to current Covid guidelines and our immunocompromised population.

## 2021-05-11 NOTE — Assessment & Plan Note (Signed)
She is in sinus rhythm today According to the patient, her recent echocardiogram was within normal range She will continue medical management

## 2021-05-11 NOTE — Assessment & Plan Note (Signed)
She has mild intermittent chronic leukopenia in the past She is not symptomatic Observe

## 2021-05-11 NOTE — Progress Notes (Signed)
[place] OFFICE PROGRESS NOTE  Patient Care Team: [facility], [name] as PCP - General [name], MD (Inactive) as PCP - Cardiology (Cardiology) [name], MD as Attending Physician (Internal Medicine)  ASSESSMENT & PLAN:  Grade 3a follicular lymphoma of lymph nodes of multiple regions [facility]) Her blood work and exam is benign However, she is at high risk for cancer recurrence I plan to repeat CT imaging before her next visit in September  Chronic atrial fibrillation She is in sinus rhythm today According to the patient, her recent echocardiogram was within normal range She will continue medical management  Chronic leukopenia She has mild intermittent chronic leukopenia in the past She is not symptomatic Observe  Orders Placed This Encounter  Procedures   CT CHEST ABDOMEN PELVIS W CONTRAST    Standing Status:   Future    Standing Expiration Date:   05/11/2022    Order Specific Question:   Preferred imaging location?    Answer:   [facility]    Order Specific Question:   Radiology Contrast Protocol - do NOT remove file path    Answer:   [email or web address]    All questions were answered. The patient knows to call the clinic with any problems, questions or concerns. The total time spent in the appointment was 20 minutes encounter with patients including review of chart and various tests results, discussions about plan of care and coordination of care plan   [name], MD 05/11/2021 9:51 AM  INTERVAL HISTORY: Please see below for problem oriented charting. She returns for further follow-up and to start maintenance rituximab She has no recent exacerbation of congestive heart failure Her chronic joint pain is stable No recent bleeding complications from anticoagulation therapy  SUMMARY OF ONCOLOGIC HISTORY: Oncology History  Grade 3a follicular lymphoma of lymph nodes of multiple regions  ([place])  01/21/2015 Surgery   She underwent excisional lymph node biopsy that came back follicular lymphoma grade 3    01/21/2015 Pathology Results   Accession: [id number] biopsy confirmed follicular lymphoma    4/81/8563 Imaging   Echocardiogram showed ejection fraction of 55-60%    02/11/2015 Imaging    PET CT scan show possible splenic involvement and diffuse lymphadenopathy throughout    02/11/2015 Bone Marrow Biopsy    bone marrow biopsy was performed and is involved by lymphoma with translocation of igH/BCL2    02/13/2015 Procedure   She had port placement.    02/17/2015 - 06/02/2015 Chemotherapy   She received R-CHOP chemo x 6    02/17/2015 Adverse Reaction   She had mild infusion reaction with cycle 1 of treatment.    04/18/2015 Imaging   PET CT scan showed near complete response to treatment.    04/22/2015 Adverse Reaction   Vincristine dose was reduced by 50% due to neuropathy from cycle 4 onwards    06/25/2015 - 07/06/2015 Hospital Admission   She was hospitalized for recent sepsis/bacteremia and a fib with RVR    07/11/2015 Imaging   repeat PEt scan showed complete response to Rx    07/14/2015 - 06/16/2017 Chemotherapy   She received maintenance Rituximab every 60 days   12/15/2015 Imaging   PET CT showed no evidence of cancer recurrence    06/15/2016 PET scan   No evidence of active lymphoma on skullbase to thigh FDG PET scan. Small LEFT periaortic retroperitoneal lymph nodes without significant metabolic activity ( Deauville 1). No change from prior    03/24/2017 Imaging   1.  Borderline enlarged abdominal retroperitoneal lymph nodes, stable. No new adenopathy in the chest, abdomen or pelvis. 2.  Aortic atherosclerosis (ICD10-170.0). 3. Enlarged pulmonary arteries, indicative of pulmonary arterial hypertension.    09/14/2017 PET scan   Stable exam. No evidence of active lymphoma or other acute findings. (Deauville score 1)    08/08/2018 PET scan   1. Evidence  of progressive/recurrent disease, as evidenced by new hypermetabolic nodes and subcutaneous nodule/nodes throughout left chest and abdomen. (Deauville 5). 2. Likely physiologic right nasopharyngeal hypermetabolism. Recommend attention on follow-up. 3. Incidental findings, including pulmonary artery enlargement, suggesting pulmonary arterial hypertension, stable left lower lobe pulmonary nodule, and aortic Atherosclerosis (ICD10-I70.0).    08/29/2018 Initial Biopsy   Soft tissue simple excision left back: Follicular lymphoma grade 1-2 positive for CD20, PAX5, bcl-6, and bcl-2.  Ki-67 30%    09/11/2018 - 02/20/2019 Chemotherapy   The patient had Bendamustine and Rituximab    12/13/2018 Imaging   1. Response to therapy, as evidenced by resolution of left chest wall nodularity and decrease in size of small left axillary nodes. 2. No residual soft tissue thickening at the site of hypermetabolism along the posterior left eleventh rib. Resolution of adjacent subcutaneous nodularity. 3. No new or progressive disease identified. 4.  Aortic Atherosclerosis (ICD10-I70.0). 5. Bilateral pulmonary nodules, similar. 6. Trace air within the urinary bladder could be iatrogenic. Possible pericystic edema. Correlate with symptoms of cystitis and recent instrumentation.    07/30/2019 Imaging   Ct chest, abdomen and pelvis No findings suspicious for active lymphoma in the chest, abdomen, or pelvis.   Small bilateral pulmonary nodules, unchanged, benign.   Again noted is trace nondependent gas within the bladder. Correlate for recent intervention.   07/09/2020 Imaging   MRI imaging performed at [facility] on the left shoulder reviewed significant lymphadenopathy, worrisome for recurrent lymphoma   07/23/2020 Imaging   1. Recurrent lymphoma as evidenced by enlarging left pectoral and abdominal retroperitoneal lymph nodes. Small left supraclavicular lymph nodes are new. 2. Avascular necrosis in the right femoral  head with secondary degenerative osteoarthritis in the right hip. 3.  Aortic atherosclerosis (ICD10-I70.0). 4. Enlarged pulmonic trunk, indicative of pulmonary arterial hypertension.     08/04/2020 -  Chemotherapy   The patient had Bendamustine and Rituxan   10/24/2020 Imaging   1. Resolution of left supraclavicular and left axillary adenopathy. No residual measurable disease suggesting an excellent response to treatment. 2. Significant interval improvement in retroperitoneal lymph nodes. No new or progressive findings. 3. Stable mild emphysematous changes. 4. Enlarged pulmonary arteries suggesting pulmonary hypertension. 5. Stable small low-attenuation splenic lesion, likely benign cyst. 6. Emphysema and aortic atherosclerosis.   02/02/2021 Imaging   1. Stable examination with unchanged small retroperitoneal lymph nodes. No new or enlarging pathologically enlarged lymph nodes in the chest, abdomen or pelvis. 2. Stable enlargement of the central pulmonary arteries, which can be seen with pulmonary arterial hypertension. 3. Emphysema and aortic atherosclerosis.     05/11/2021 -  Chemotherapy    Patient is on Treatment Plan: NON-HODGKINS LYMPHOMA RITUXIMAB Q60D MAINTENANCE         REVIEW OF SYSTEMS:   Constitutional: Denies fevers, chills or abnormal weight loss Eyes: Denies blurriness of vision Ears, nose, mouth, throat, and face: Denies mucositis or sore throat Respiratory: Denies cough, dyspnea or wheezes Cardiovascular: Denies palpitation, chest discomfort or lower extremity swelling Gastrointestinal:  Denies nausea, heartburn or change in bowel habits Skin: Denies abnormal skin rashes Lymphatics: Denies new lymphadenopathy or easy bruising Neurological:Denies numbness,  tingling or new weaknesses Behavioral/Psych: Mood is stable, no new changes  All other systems were reviewed with the patient and are negative.  I have reviewed the past medical history, past surgical history,  social history and family history with the patient and they are unchanged from previous note.  ALLERGIES:  is allergic to amiodarone.  MEDICATIONS:  Current Outpatient Medications  Medication Sig Dispense Refill   acetaminophen (TYLENOL) 500 MG tablet Take 500 mg by mouth every 6 (six) hours as needed for moderate pain or headache.     apixaban (ELIQUIS) 5 MG TABS tablet Take 5 mg by mouth 2 (two) times daily.      diclofenac sodium (VOLTAREN) 1 % GEL Apply 2 g topically 3 (three) times daily as needed (knee pain). 100 g 0   levothyroxine (SYNTHROID, LEVOTHROID) 112 MCG tablet Take 112 mcg by mouth daily before breakfast.  9   lidocaine-prilocaine (EMLA) cream Apply 1 application topically daily as needed. 30 g 3   metoprolol succinate (TOPROL-XL) 25 MG 24 hr tablet Take 0.5 tablets (12.5 mg total) by mouth daily. Take with or immediately following a meal. 90 tablet 3   ondansetron (ZOFRAN) 8 MG tablet Take 1 tablet (8 mg total) by mouth every 8 (eight) hours as needed for refractory nausea / vomiting. Start on day 2 after bendamustine chemo. (Patient not taking: Reported on 02/23/2021) 30 tablet 1   oxyCODONE-acetaminophen (PERCOCET) 5-325 MG tablet Take 0.5-1 tablets by mouth every 6 (six) hours as needed for moderate pain or severe pain. 20 tablet 0   polyvinyl alcohol (LIQUIFILM TEARS) 1.4 % ophthalmic solution Place 1 drop into both eyes as needed for dry eyes.     No current facility-administered medications for this visit.   Facility-Administered Medications Ordered in Other Visits  Medication Dose Route Frequency Provider Last Rate Last Admin   ondansetron (ZOFRAN) 8 mg in sodium chloride 0.9 % 50 mL IVPB   Intravenous Once Alvy Bimler, Adison Reifsteck, MD       sodium chloride 0.9 % injection 10 mL  10 mL Intravenous PRN Alvy Bimler, Saanvika Vazques, MD   10 mL at 04/05/17 1315   sodium chloride flush (NS) 0.9 % injection 10 mL  10 mL Intracatheter Once Alvy Bimler, Donye Campanelli, MD        PHYSICAL EXAMINATION: ECOG PERFORMANCE  STATUS: 0 - Asymptomatic GENERAL:alert, no distress and comfortable SKIN: skin color, texture, turgor are normal, no rashes or significant lesions EYES: normal, Conjunctiva are pink and non-injected, sclera clear OROPHARYNX:no exudate, no erythema and lips, buccal mucosa, and tongue normal  NECK: supple, thyroid normal size, non-tender, without nodularity LYMPH:  no palpable lymphadenopathy in the cervical, axillary or inguinal LUNGS: clear to auscultation and percussion with normal breathing effort HEART: regular rate & rhythm and no murmurs and no lower extremity edema ABDOMEN:abdomen soft, non-tender and normal bowel sounds Musculoskeletal:no cyanosis of digits and no clubbing  NEURO: alert & oriented x 3 with fluent speech, no focal motor/sensory deficits  LABORATORY DATA:  I have reviewed the data as listed    Component Value Date/Time   NA 139 04/07/2021 1113   NA 143 07/16/2019 1132   NA 142 08/29/2017 0838   K 4.0 04/07/2021 1113   K 4.2 08/29/2017 0838   CL 106 04/07/2021 1113   CO2 26 04/07/2021 1113   CO2 23 08/29/2017 0838   GLUCOSE 85 04/07/2021 1113   GLUCOSE 97 08/29/2017 0838   BUN 34 (H) 04/07/2021 1113   BUN 27 07/16/2019 1132  BUN 24.0 08/29/2017 0838   CREATININE 1.07 (H) 04/07/2021 1113   CREATININE 1.0 08/29/2017 0838   CALCIUM 9.2 04/07/2021 1113   CALCIUM 8.9 08/29/2017 0838   PROT 6.8 04/07/2021 1113   PROT 6.4 08/29/2017 0838   ALBUMIN 4.2 04/07/2021 1113   ALBUMIN 3.8 08/29/2017 0838   AST 19 04/07/2021 1113   AST 18 08/29/2017 0838   ALT 14 04/07/2021 1113   ALT 16 08/29/2017 0838   ALKPHOS 89 04/07/2021 1113   ALKPHOS 93 08/29/2017 0838   BILITOT 0.6 04/07/2021 1113   BILITOT 0.33 08/29/2017 0838   GFRNONAA 53 (L) 04/07/2021 1113   GFRAA 58 (L) 05/26/2020 1103   GFRAA 39 (L) 07/30/2019 1010    No results found for: SPEP, UPEP  Lab Results  Component Value Date   WBC 3.7 (L) 05/11/2021   NEUTROABS 2.8 05/11/2021   HGB 12.3  05/11/2021   HCT 38.3 05/11/2021   MCV 92.7 05/11/2021   PLT 160 05/11/2021      Chemistry      Component Value Date/Time   NA 139 04/07/2021 1113   NA 143 07/16/2019 1132   NA 142 08/29/2017 0838   K 4.0 04/07/2021 1113   K 4.2 08/29/2017 0838   CL 106 04/07/2021 1113   CO2 26 04/07/2021 1113   CO2 23 08/29/2017 0838   BUN 34 (H) 04/07/2021 1113   BUN 27 07/16/2019 1132   BUN 24.0 08/29/2017 0838   CREATININE 1.07 (H) 04/07/2021 1113   CREATININE 1.0 08/29/2017 0838      Component Value Date/Time   CALCIUM 9.2 04/07/2021 1113   CALCIUM 8.9 08/29/2017 0838   ALKPHOS 89 04/07/2021 1113   ALKPHOS 93 08/29/2017 0838   AST 19 04/07/2021 1113   AST 18 08/29/2017 0838   ALT 14 04/07/2021 1113   ALT 16 08/29/2017 0838   BILITOT 0.6 04/07/2021 1113   BILITOT 0.33 08/29/2017 2023

## 2021-07-08 ENCOUNTER — Inpatient Hospital Stay: Payer: No Typology Code available for payment source | Attending: Hematology and Oncology

## 2021-07-08 ENCOUNTER — Ambulatory Visit (HOSPITAL_COMMUNITY)
Admission: RE | Admit: 2021-07-08 | Discharge: 2021-07-08 | Disposition: A | Discharge status: Home / Self Care | Payer: No Typology Code available for payment source | Source: Ambulatory Visit | Attending: Hematology and Oncology | Admitting: Hematology and Oncology

## 2021-07-08 ENCOUNTER — Other Ambulatory Visit: Payer: Self-pay

## 2021-07-08 DIAGNOSIS — C8238 Follicular lymphoma grade IIIa, lymph nodes of multiple sites: Secondary | ICD-10-CM | POA: Diagnosis not present

## 2021-07-08 DIAGNOSIS — Z5112 Encounter for antineoplastic immunotherapy: Secondary | ICD-10-CM | POA: Insufficient documentation

## 2021-07-08 DIAGNOSIS — Z7189 Other specified counseling: Secondary | ICD-10-CM

## 2021-07-08 LAB — CMP (CANCER CENTER ONLY)
ALT: 10 U/L (ref 0–44)
AST: 19 U/L (ref 15–41)
Albumin: 4.2 g/dL (ref 3.5–5.0)
Alkaline Phosphatase: 89 U/L (ref 38–126)
Anion gap: 13 (ref 5–15)
BUN: 25 mg/dL — ABNORMAL HIGH (ref 8–23)
CO2: 24 mmol/L (ref 22–32)
Calcium: 9.4 mg/dL (ref 8.9–10.3)
Chloride: 106 mmol/L (ref 98–111)
Creatinine: 1.38 mg/dL — ABNORMAL HIGH (ref 0.44–1.00)
GFR, Estimated: 39 mL/min — ABNORMAL LOW (ref >60)
Glucose, Bld: 119 mg/dL — ABNORMAL HIGH (ref 70–99)
Potassium: 4.4 mmol/L (ref 3.5–5.1)
Sodium: 143 mmol/L (ref 135–145)
Total Bilirubin: 0.8 mg/dL (ref 0.3–1.2)
Total Protein: 6.6 g/dL (ref 6.5–8.1)

## 2021-07-08 LAB — CBC WITH DIFFERENTIAL (CANCER CENTER ONLY)
Abs Immature Granulocytes: 0.24 10*3/uL — ABNORMAL HIGH (ref 0.00–0.07)
Basophils Absolute: 0 10*3/uL (ref 0.0–0.1)
Basophils Relative: 1 %
Eosinophils Absolute: 0.3 10*3/uL (ref 0.0–0.5)
Eosinophils Relative: 6 %
HCT: 39.5 % (ref 36.0–46.0)
Hemoglobin: 13.1 g/dL (ref 12.0–15.0)
Immature Granulocytes: 5 %
Lymphocytes Relative: 8 %
Lymphs Abs: 0.4 10*3/uL — ABNORMAL LOW (ref 0.7–4.0)
MCH: 30 pg (ref 26.0–34.0)
MCHC: 33.2 g/dL (ref 30.0–36.0)
MCV: 90.6 fL (ref 80.0–100.0)
Monocytes Absolute: 0.5 10*3/uL (ref 0.1–1.0)
Monocytes Relative: 11 %
Neutro Abs: 3.2 10*3/uL (ref 1.7–7.7)
Neutrophils Relative %: 69 %
Platelet Count: 163 10*3/uL (ref 150–400)
RBC: 4.36 MIL/uL (ref 3.87–5.11)
RDW: 13.9 % (ref 11.5–15.5)
WBC Count: 4.6 10*3/uL (ref 4.0–10.5)
nRBC: 0 % (ref 0.0–0.2)

## 2021-07-08 MED ORDER — HEPARIN SOD (PORK) LOCK FLUSH 100 UNIT/ML IV SOLN
INTRAVENOUS | Status: AC
  Administered 2021-07-08: 500 [IU] via INTRAVENOUS
  Filled 2021-07-08: qty 5

## 2021-07-08 MED ORDER — IOHEXOL 350 MG/ML SOLN
100.0000 mL | Freq: Once | INTRAVENOUS | Status: AC | PRN
  Administered 2021-07-08: 75 mL via INTRAVENOUS

## 2021-07-08 MED ORDER — HEPARIN SOD (PORK) LOCK FLUSH 100 UNIT/ML IV SOLN: 500.0000 [IU] | Freq: Once | INTRAVENOUS | Status: AC

## 2021-07-08 MED ORDER — SODIUM CHLORIDE 0.9% FLUSH
10.0000 mL | Freq: Once | INTRAVENOUS | Status: AC
  Administered 2021-07-08: 10 mL

## 2021-07-10 ENCOUNTER — Inpatient Hospital Stay (HOSPITAL_BASED_OUTPATIENT_CLINIC_OR_DEPARTMENT_OTHER): Payer: No Typology Code available for payment source | Admitting: Hematology and Oncology

## 2021-07-10 ENCOUNTER — Encounter: Payer: Self-pay | Admitting: Hematology and Oncology

## 2021-07-10 ENCOUNTER — Inpatient Hospital Stay: Payer: No Typology Code available for payment source

## 2021-07-10 ENCOUNTER — Other Ambulatory Visit: Payer: Self-pay

## 2021-07-10 VITALS — BP 160/72 | HR 71 | Temp 97.7°F | Resp 18

## 2021-07-10 DIAGNOSIS — C8238 Follicular lymphoma grade IIIa, lymph nodes of multiple sites: Secondary | ICD-10-CM

## 2021-07-10 DIAGNOSIS — N1831 Chronic kidney disease, stage 3a: Secondary | ICD-10-CM

## 2021-07-10 DIAGNOSIS — I272 Pulmonary hypertension, unspecified: Secondary | ICD-10-CM | POA: Diagnosis not present

## 2021-07-10 DIAGNOSIS — Z7189 Other specified counseling: Secondary | ICD-10-CM

## 2021-07-10 DIAGNOSIS — Z5112 Encounter for antineoplastic immunotherapy: Secondary | ICD-10-CM | POA: Diagnosis present

## 2021-07-10 MED ORDER — SODIUM CHLORIDE 0.9% FLUSH
10.0000 mL | INTRAVENOUS | Status: DC | PRN
  Administered 2021-07-10: 10 mL

## 2021-07-10 MED ORDER — SODIUM CHLORIDE 0.9 % IV SOLN: Freq: Once | INTRAVENOUS | Status: AC

## 2021-07-10 MED ORDER — HEPARIN SOD (PORK) LOCK FLUSH 100 UNIT/ML IV SOLN
500.0000 [IU] | Freq: Once | INTRAVENOUS | Status: AC | PRN
  Administered 2021-07-10: 500 [IU]

## 2021-07-10 MED ORDER — DIPHENHYDRAMINE HCL 25 MG PO CAPS
50.0000 mg | ORAL_CAPSULE | Freq: Once | ORAL | Status: AC
  Administered 2021-07-10: 50 mg via ORAL
  Filled 2021-07-10: qty 2

## 2021-07-10 MED ORDER — SODIUM CHLORIDE 0.9 % IV SOLN
375.0000 mg/m2 | Freq: Once | INTRAVENOUS | Status: AC
  Administered 2021-07-10: 600 mg via INTRAVENOUS
  Filled 2021-07-10: qty 50

## 2021-07-10 MED ORDER — ACETAMINOPHEN 325 MG PO TABS
650.0000 mg | ORAL_TABLET | Freq: Once | ORAL | Status: AC
  Administered 2021-07-10: 650 mg via ORAL
  Filled 2021-07-10: qty 2

## 2021-07-10 NOTE — Assessment & Plan Note (Signed)
I have reviewed CT imaging with the patient and family She has no signs of residual disease or relapse She tolerated rituximab well The plan will be to continue treatment every 60 days for 2 years Do not plan to repeat imaging study until mid next year

## 2021-07-10 NOTE — Patient Instructions (Signed)
[facility] MEDICAL ONCOLOGY  Discharge Instructions: Thank you for choosing [facility] to provide your oncology and hematology care.   If you have a lab appointment with the Cancer Center, please go directly to the Cancer Center and check in at the registration area.   Wear comfortable clothing and clothing appropriate for easy access to any Portacath or PICC line.   We strive to give you quality time with your provider. You may need to reschedule your appointment if you arrive late (15 or more minutes).  Arriving late affects you and other patients whose appointments are after yours.  Also, if you miss three or more appointments without notifying the office, you may be dismissed from the clinic at the provider's discretion.      For prescription refill requests, have your pharmacy contact our office and allow 72 hours for refills to be completed.    Today you received the following chemotherapy and/or immunotherapy agents rituxan      To help prevent nausea and vomiting after your treatment, we encourage you to take your nausea medication as directed.  BELOW ARE SYMPTOMS THAT SHOULD BE REPORTED IMMEDIATELY: *FEVER GREATER THAN 100.4 F (38 C) OR HIGHER *CHILLS OR SWEATING *NAUSEA AND VOMITING THAT IS NOT CONTROLLED WITH YOUR NAUSEA MEDICATION *UNUSUAL SHORTNESS OF BREATH *UNUSUAL BRUISING OR BLEEDING *URINARY PROBLEMS (pain or burning when urinating, or frequent urination) *BOWEL PROBLEMS (unusual diarrhea, constipation, pain near the anus) TENDERNESS IN MOUTH AND THROAT WITH OR WITHOUT PRESENCE OF ULCERS (sore throat, sores in mouth, or a toothache) UNUSUAL RASH, SWELLING OR PAIN  UNUSUAL VAGINAL DISCHARGE OR ITCHING   Items with * indicate a potential emergency and should be followed up as soon as possible or go to the Emergency Department if any problems should occur.  Please show the CHEMOTHERAPY ALERT CARD or IMMUNOTHERAPY ALERT CARD at check-in to the  Emergency Department and triage nurse.  Should you have questions after your visit or need to cancel or reschedule your appointment, please contact [facility]  Dept: [phone]  and follow the prompts.  Office hours are 8:00 a.m. to 4:30 p.m. Monday - Friday. Please note that voicemails left after 4:00 p.m. may not be returned until the following business day.  We are closed weekends and major holidays. You have access to a nurse at all times for urgent questions. Please call the main number to the clinic Dept: [phone] and follow the prompts.   For any non-urgent questions, you may also contact your provider using MyChart. We now offer e-Visits for anyone 18 and older to request care online for non-urgent symptoms. For details visit [email or web address].   Also download the MyChart app! Go to the app store, search "MyChart", open the app, select [facility], and log in with your MyChart username and password.  Due to Covid, a mask is required upon entering the hospital/clinic. If you do not have a mask, one will be given to you upon arrival. For doctor visits, patients may have 1 support person aged 18 or older with them. For treatment visits, patients cannot have anyone with them due to current Covid guidelines and our immunocompromised population.   

## 2021-07-10 NOTE — Assessment & Plan Note (Signed)
We discussed imaging study findings We discussed importance of risk factor modification and close monitoring of heart function with her cardiologist

## 2021-07-10 NOTE — Assessment & Plan Note (Signed)
She has borderline renal dysfunction  We discussed the importance of hydration

## 2021-07-10 NOTE — Progress Notes (Signed)
[name] OFFICE PROGRESS NOTE  Patient Care Team: [facility], [name] as PCP - General [name], MD (Inactive) as PCP - Cardiology (Cardiology) [name], MD as Attending Physician (Internal Medicine)  ASSESSMENT & PLAN:  Grade 3a follicular lymphoma of lymph nodes of multiple regions [facility]) I have reviewed CT imaging with the patient and family She has no signs of residual disease or relapse She tolerated rituximab well The plan will be to continue treatment every 60 days for 2 years Do not plan to repeat imaging study until mid next year  Chronic kidney disease, stage III (moderate) ([place]) She has borderline renal dysfunction  We discussed the importance of hydration  Pulmonary hypertension ([place]) We discussed imaging study findings We discussed importance of risk factor modification and close monitoring of heart function with her cardiologist  No orders of the defined types were placed in this encounter.   All questions were answered. The patient knows to call the clinic with any problems, questions or concerns. The total time spent in the appointment was 20 minutes encounter with patients including review of chart and various tests results, discussions about plan of care and coordination of care plan   [name], MD 07/10/2021 11:20 AM  INTERVAL HISTORY: Please see below for problem oriented charting. she returns for treatment follow-up with maintenance treatment of rituximab for follicular lymphoma She is here accompanied by her husband and her daughter is available over the phone She is doing well except for musculoskeletal pain Denies recent exacerbation of congestive heart failure No infusion reaction to rituximab  REVIEW OF SYSTEMS:   Constitutional: Denies fevers, chills or abnormal weight loss Eyes: Denies blurriness of vision Ears, nose, mouth, throat, and face: Denies mucositis or sore throat Respiratory: Denies cough,  dyspnea or wheezes Cardiovascular: Denies palpitation, chest discomfort or lower extremity swelling Gastrointestinal:  Denies nausea, heartburn or change in bowel habits Skin: Denies abnormal skin rashes Lymphatics: Denies new lymphadenopathy or easy bruising Neurological:Denies numbness, tingling or new weaknesses Behavioral/Psych: Mood is stable, no new changes  All other systems were reviewed with the patient and are negative.  I have reviewed the past medical history, past surgical history, social history and family history with the patient and they are unchanged from previous note.  ALLERGIES:  is allergic to amiodarone.  MEDICATIONS:  Current Outpatient Medications  Medication Sig Dispense Refill   acetaminophen (TYLENOL) 500 MG tablet Take 500 mg by mouth every 6 (six) hours as needed for moderate pain or headache.     apixaban (ELIQUIS) 5 MG TABS tablet Take 5 mg by mouth 2 (two) times daily.      diclofenac sodium (VOLTAREN) 1 % GEL Apply 2 g topically 3 (three) times daily as needed (knee pain). 100 g 0   levothyroxine (SYNTHROID, LEVOTHROID) 112 MCG tablet Take 112 mcg by mouth daily before breakfast.  9   lidocaine-prilocaine (EMLA) cream Apply 1 application topically daily as needed. 30 g 3   metoprolol succinate (TOPROL-XL) 25 MG 24 hr tablet Take 0.5 tablets (12.5 mg total) by mouth daily. Take with or immediately following a meal. 90 tablet 3   ondansetron (ZOFRAN) 8 MG tablet Take 1 tablet (8 mg total) by mouth every 8 (eight) hours as needed for refractory nausea / vomiting. Start on day 2 after bendamustine chemo. (Patient not taking: Reported on 02/23/2021) 30 tablet 1   oxyCODONE-acetaminophen (PERCOCET) 5-325 MG tablet Take 0.5-1 tablets by mouth every 6 (six) hours as needed for  moderate pain or severe pain. 20 tablet 0   polyvinyl alcohol (LIQUIFILM TEARS) 1.4 % ophthalmic solution Place 1 drop into both eyes as needed for dry eyes.     No current facility-administered  medications for this visit.   Facility-Administered Medications Ordered in Other Visits  Medication Dose Route Frequency Provider Last Rate Last Admin   heparin lock flush 100 unit/mL  500 Units Intracatheter Once PRN Alvy Bimler, Floretta Petro, MD       ondansetron (ZOFRAN) 8 mg in sodium chloride 0.9 % 50 mL IVPB   Intravenous Once Alvy Bimler, Terald Jump, MD       sodium chloride 0.9 % injection 10 mL  10 mL Intravenous PRN Alvy Bimler, Mecca Guitron, MD   10 mL at 04/05/17 1315   sodium chloride flush (NS) 0.9 % injection 10 mL  10 mL Intracatheter Once Alvy Bimler, Donyel Castagnola, MD       sodium chloride flush (NS) 0.9 % injection 10 mL  10 mL Intracatheter PRN Alvy Bimler, Guerino Caporale, MD        SUMMARY OF ONCOLOGIC HISTORY: Oncology History  Grade 3a follicular lymphoma of lymph nodes of multiple regions ([place])  01/21/2015 Surgery   She underwent excisional lymph node biopsy that came back follicular lymphoma grade 3   01/21/2015 Pathology Results   Accession: [id number] biopsy confirmed follicular lymphoma   06/16/9406 Imaging   Echocardiogram showed ejection fraction of 55-60%   02/11/2015 Imaging    PET CT scan show possible splenic involvement and diffuse lymphadenopathy throughout   02/11/2015 Bone Marrow Biopsy    bone marrow biopsy was performed and is involved by lymphoma with translocation of igH/BCL2   02/13/2015 Procedure   She had port placement.   02/17/2015 - 06/02/2015 Chemotherapy   She received R-CHOP chemo x 6   02/17/2015 Adverse Reaction   She had mild infusion reaction with cycle 1 of treatment.   04/18/2015 Imaging   PET CT scan showed near complete response to treatment.   04/22/2015 Adverse Reaction   Vincristine dose was reduced by 50% due to neuropathy from cycle 4 onwards   06/25/2015 - 07/06/2015 Hospital Admission   She was hospitalized for recent sepsis/bacteremia and a fib with RVR   07/11/2015 Imaging   repeat PEt scan showed complete response to Rx   07/14/2015 - 06/16/2017 Chemotherapy   She received maintenance  Rituximab every 60 days   12/15/2015 Imaging   PET CT showed no evidence of cancer recurrence   06/15/2016 PET scan   No evidence of active lymphoma on skullbase to thigh FDG PET scan. Small LEFT periaortic retroperitoneal lymph nodes without significant metabolic activity ( [place]auville 1). No change from prior   03/24/2017 Imaging   1. Borderline enlarged abdominal retroperitoneal lymph nodes, stable. No new adenopathy in the chest, abdomen or pelvis. 2.  Aortic atherosclerosis (ICD10-170.0). 3. Enlarged pulmonary arteries, indicative of pulmonary arterial hypertension.   09/14/2017 PET scan   Stable exam. No evidence of active lymphoma or other acute findings. (Deauville score 1)   08/08/2018 PET scan   1. Evidence of progressive/recurrent disease, as evidenced by new hypermetabolic nodes and subcutaneous nodule/nodes throughout left chest and abdomen. (Deauville 5). 2. Likely physiologic right nasopharyngeal hypermetabolism. Recommend attention on follow-up. 3. Incidental findings, including pulmonary artery enlargement, suggesting pulmonary arterial hypertension, stable left lower lobe pulmonary nodule, and aortic Atherosclerosis (ICD10-I70.0).   08/29/2018 Initial Biopsy   Soft tissue simple excision left back: Follicular lymphoma grade 1-2 positive for CD20, PAX5, bcl-6, and bcl-2.  Ki-67 30%  09/11/2018 - 02/20/2019 Chemotherapy   The patient had Bendamustine and Rituximab   12/13/2018 Imaging   1. Response to therapy, as evidenced by resolution of left chest wall nodularity and decrease in size of small left axillary nodes. 2. No residual soft tissue thickening at the site of hypermetabolism along the posterior left eleventh rib. Resolution of adjacent subcutaneous nodularity. 3. No new or progressive disease identified. 4.  Aortic Atherosclerosis (ICD10-I70.0). 5. Bilateral pulmonary nodules, similar. 6. Trace air within the urinary bladder could be iatrogenic. Possible pericystic  edema. Correlate with symptoms of cystitis and recent instrumentation.   07/30/2019 Imaging   Ct chest, abdomen and pelvis No findings suspicious for active lymphoma in the chest, abdomen, or pelvis.   Small bilateral pulmonary nodules, unchanged, benign.   Again noted is trace nondependent gas within the bladder. Correlate for recent intervention.   07/09/2020 Imaging   [facility] performed at [facility] on the left shoulder reviewed significant lymphadenopathy, worrisome for recurrent lymphoma   07/23/2020 Imaging   1. Recurrent lymphoma as evidenced by enlarging left pectoral and abdominal retroperitoneal lymph nodes. Small left supraclavicular lymph nodes are new. 2. Avascular necrosis in the right femoral head with secondary degenerative osteoarthritis in the right hip. 3.  Aortic atherosclerosis (ICD10-I70.0). 4. Enlarged pulmonic trunk, indicative of pulmonary arterial hypertension.     08/04/2020 -  Chemotherapy   The patient had Bendamustine and Rituxan   10/24/2020 Imaging   1. Resolution of left supraclavicular and left axillary adenopathy. No residual measurable disease suggesting an excellent response to treatment. 2. Significant interval improvement in retroperitoneal lymph nodes. No new or progressive findings. 3. Stable mild emphysematous changes. 4. Enlarged pulmonary arteries suggesting pulmonary hypertension. 5. Stable small low-attenuation splenic lesion, likely benign cyst. 6. Emphysema and aortic atherosclerosis.   02/02/2021 Imaging   1. Stable examination with unchanged small retroperitoneal lymph nodes. No new or enlarging pathologically enlarged lymph nodes in the chest, abdomen or pelvis. 2. Stable enlargement of the central pulmonary arteries, which can be seen with pulmonary arterial hypertension. 3. Emphysema and aortic atherosclerosis.     05/11/2021 -  Chemotherapy    Patient is on Treatment Plan: NON-HODGKINS LYMPHOMA RITUXIMAB Q60D MAINTENANCE        07/09/2021 Imaging   No new or enlarging lymph nodes seen in the chest, abdomen or pelvis.   Stable enlarged main pulmonary artery which can be seen in the setting of pulmonary hypertension.   Aortic Atherosclerosis (ICD10-I70.0) and Emphysema (ICD10-J43.9).     PHYSICAL EXAMINATION: ECOG PERFORMANCE STATUS: 1 - Symptomatic but completely ambulatory  Vitals:   07/10/21 0940  BP: 140/63  Pulse: 64  Resp: 18  Temp: 97.7 F (36.5 C)  SpO2: 100%   Filed Weights   07/10/21 0940  Weight: 140 lb 3.2 oz (63.6 kg)    GENERAL:alert, no distress and comfortable NEURO: alert & oriented x 3 with fluent speech, no focal motor/sensory deficits  LABORATORY DATA:  I have reviewed the data as listed    Component Value Date/Time   NA 143 07/08/2021 0935   NA 143 07/16/2019 1132   NA 142 08/29/2017 0838   K 4.4 07/08/2021 0935   K 4.2 08/29/2017 0838   CL 106 07/08/2021 0935   CO2 24 07/08/2021 0935   CO2 23 08/29/2017 0838   GLUCOSE 119 (H) 07/08/2021 0935   GLUCOSE 97 08/29/2017 0838   BUN 25 (H) 07/08/2021 0935   BUN 27 07/16/2019 1132   BUN 24.0 08/29/2017 9509  CREATININE 1.38 (H) 07/08/2021 0935   CREATININE 1.0 08/29/2017 0838   CALCIUM 9.4 07/08/2021 0935   CALCIUM 8.9 08/29/2017 0838   PROT 6.6 07/08/2021 0935   PROT 6.4 08/29/2017 0838   ALBUMIN 4.2 07/08/2021 0935   ALBUMIN 3.8 08/29/2017 0838   AST 19 07/08/2021 0935   AST 18 08/29/2017 0838   ALT 10 07/08/2021 0935   ALT 16 08/29/2017 0838   ALKPHOS 89 07/08/2021 0935   ALKPHOS 93 08/29/2017 0838   BILITOT 0.8 07/08/2021 0935   BILITOT 0.33 08/29/2017 0838   GFRNONAA 39 (L) 07/08/2021 0935   GFRAA 58 (L) 05/26/2020 1103   GFRAA 39 (L) 07/30/2019 1010    No results found for: SPEP, UPEP  Lab Results  Component Value Date   WBC 4.6 07/08/2021   NEUTROABS 3.2 07/08/2021   HGB 13.1 07/08/2021   HCT 39.5 07/08/2021   MCV 90.6 07/08/2021   PLT 163 07/08/2021      Chemistry      Component Value  Date/Time   NA 143 07/08/2021 0935   NA 143 07/16/2019 1132   NA 142 08/29/2017 0838   K 4.4 07/08/2021 0935   K 4.2 08/29/2017 0838   CL 106 07/08/2021 0935   CO2 24 07/08/2021 0935   CO2 23 08/29/2017 0838   BUN 25 (H) 07/08/2021 0935   BUN 27 07/16/2019 1132   BUN 24.0 08/29/2017 0838   CREATININE 1.38 (H) 07/08/2021 0935   CREATININE 1.0 08/29/2017 0838      Component Value Date/Time   CALCIUM 9.4 07/08/2021 0935   CALCIUM 8.9 08/29/2017 0838   ALKPHOS 89 07/08/2021 0935   ALKPHOS 93 08/29/2017 0838   AST 19 07/08/2021 0935   AST 18 08/29/2017 0838   ALT 10 07/08/2021 0935   ALT 16 08/29/2017 0838   BILITOT 0.8 07/08/2021 0935   BILITOT 0.33 08/29/2017 0838       RADIOGRAPHIC STUDIES: I have personally reviewed the radiological images as listed and agreed with the findings in the report. CT CHEST ABDOMEN PELVIS W CONTRAST  Result Date: 07/09/2021 CLINICAL DATA:  History of follicular lymphoma EXAM: CT CHEST, ABDOMEN, AND PELVIS WITH CONTRAST TECHNIQUE: Multidetector CT imaging of the chest, abdomen and pelvis was performed following the standard protocol during bolus administration of intravenous contrast. CONTRAST:  28m OMNIPAQUE IOHEXOL 350 MG/ML SOLN COMPARISON:  CT chest, abdomen and pelvis dated October 24, 2020 FINDINGS: CT CHEST FINDINGS Cardiovascular: Cardiomegaly. No pericardial effusion. Enlarged main pulmonary artery, measuring up to 3.9 cm. Atherosclerotic disease of the thoracic aorta. No significant coronary artery calcifications visualized. Right chest wall port with tip in the lower SVC. Mediastinum/Nodes: No pathologically enlarged lymph nodes seen in the chest. Normal esophagus. Thyroid is unremarkable. Lungs/Pleura: Central airways are patent. Mild bibasilar atelectasis. No consolidation, pleural effusion or pneumothorax. Solid bilateral pulmonary nodules are unchanged in size compared to prior exam. Reference nodule in the left lower lobe measuring 5 mm on  series 4, image 79. Musculoskeletal: Prior left total shoulder arthroplasty. No aggressive appearing osseous lesions. CT ABDOMEN PELVIS FINDINGS Hepatobiliary: No focal liver abnormality is seen. No gallstones, gallbladder wall thickening, or biliary dilatation. Pancreas: Unremarkable. No pancreatic ductal dilatation or surrounding inflammatory changes. Spleen: Normal in size.  Stable small splenic cyst measuring 5 mm. Adrenals/Urinary Tract: Adrenal glands are unremarkable. Kidneys are normal, without renal calculi, focal lesion, or hydronephrosis. Bladder is unremarkable. Stomach/Bowel: Stomach is within normal limits. Appendix appears normal. No evidence of bowel wall thickening, distention,  or inflammatory changes. Vascular/Lymphatic: No significant vascular findings are present. No enlarged abdominal or pelvic lymph nodes. Reference left periaortic lymph node measures 6 mm in short axis on series 2, image 72, unchanged compared to prior exam Reproductive: Status post hysterectomy. No adnexal masses. Other: No abdominal wall hernia or abnormality. No abdominopelvic ascites. Musculoskeletal: Prior posterior fusion of the lower lumbar spine with laminectomy changes at L5-S1. no aggressive appearing osseous lesions. IMPRESSION: No new or enlarging lymph nodes seen in the chest, abdomen or pelvis. Stable enlarged main pulmonary artery which can be seen in the setting of pulmonary hypertension. Aortic Atherosclerosis (ICD10-I70.0) and Emphysema (ICD10-J43.9). Electronically Signed   By: Yetta Glassman M.D.   On: 07/09/2021 11:06

## 2021-09-01 ENCOUNTER — Telehealth: Payer: Self-pay | Admitting: *Deleted

## 2021-09-01 NOTE — Telephone Encounter (Signed)
Contacted by Burna Sis, RN  w/[facility] after hours nurse triage). The nurse said the patient called them this morning before office hours:  Patient has had cough, DOE, hoarse since Friday. Tested negative for covid on Friday.    [facility] recommends she be seen in next 3-4 hours. Their nurse wants to know if patient could be seen iin this office of if Dr. Alvy Bimler recommends PCP, Urgent Care or ED.  Dr. Alvy Bimler informed - per Dr. Alvy Bimler, called [facility] [place],RN and asked her to advise patient to repeat home  home covid test today - now if possible -and call us back as soon as she can. Ms. Nicki Reaper said she would advise patient of Dr. Alvy Bimler instructions.  Patient called office - covid neg. No temp elevation. Cough productive at times-white/cream sputum. Taking OTC cough med.  States not really short of breath -- she gets out of breath after coughing. Not coughing at night, able to sleep through night.  Dr. Alvy Bimler informed of patient information: Per Dr. Alvy Bimler: offered patient appt today with CXR and lab prior to MD appt. If patient does not want to come in, patient can continue OTC cough med and can add allergy med such as claritin/zyrtec if desired. Patient declined appt today, stating she will continue to treat with OTC cough med. Advised patient per MD if she was not better in a few days, call office and she can be seen first pf next week. Patient verbalized understanding of all information and advice.

## 2021-09-03 ENCOUNTER — Ambulatory Visit
Admission: EM | Admit: 2021-09-03 | Discharge: 2021-09-03 | Disposition: A | Discharge status: Home / Self Care | Payer: No Typology Code available for payment source | Attending: Physician Assistant | Admitting: Physician Assistant

## 2021-09-03 ENCOUNTER — Other Ambulatory Visit: Payer: Self-pay

## 2021-09-03 DIAGNOSIS — J101 Influenza due to other identified influenza virus with other respiratory manifestations: Secondary | ICD-10-CM | POA: Diagnosis not present

## 2021-09-03 LAB — POCT INFLUENZA A/B
Influenza A, POC: POSITIVE — AB
Influenza B, POC: NEGATIVE

## 2021-09-03 MED ORDER — OSELTAMIVIR PHOSPHATE 75 MG PO CAPS: 75.0000 mg | ORAL_CAPSULE | Freq: Two times a day (BID) | ORAL | 0 refills | Status: DC

## 2021-09-03 NOTE — ED Triage Notes (Signed)
Pt c/o cough, fatigue, headache, nasal drainage, dyspnea   Denies fever, sore throat, nausea, vomiting, diarrhea, constipation  Onset a week ago   Dx with lymphoma on chemo

## 2021-09-03 NOTE — ED Provider Notes (Signed)
[facility]    CSN: [id number] Arrival date & time: 09/03/21  1807      History   Chief Complaint Chief Complaint  Patient presents with   Cough    HPI Carrie [name] is a 80 y.o. female.   Patient here today for evaluation of cough, fatigue and headache that started a week ago.  She is currently taking chemo for lymphoma.  She denies fever.  She has not had vomiting or diarrhea.  The history is provided by the patient.  Cough Associated symptoms: no chills, no ear pain, no eye discharge, no fever, no shortness of breath, no sore throat and no wheezing    Past Medical History:  Diagnosis Date   A-fib ([place])    Anemia    pt denies    Atrial fibrillation with rapid ventricular response ([place]) 06/25/2015   Carpal tunnel syndrome, bilateral    pt denies    Cervical spondylosis without myelopathy 10/25/2013   Dental bridge present    DJD (degenerative joint disease)    Dyspnea    sometimes hx of cancer non hodgkins lymphoma    Dysrhythmia    WENT INTO A FIB IN 2017    Elevated troponin 04/18/946   Follicular lymphoma grade 3a ([place]) 02/03/2015   hx of nonhodgkins lymphoma x 3    Headache    sometimes a headache    History of echocardiogram    Echo 12/16: EF 60-65%, no RWMA, severe LAE   History of nuclear stress test    Myoview 1/17: EF 55%, Normal study. No ischemia or scar.   Hypertension    Hypothyroid    Nausea without vomiting 06/20/2015   Stage III chronic kidney disease ([place]) 07/01/2015   Thrush of mouth and esophagus ([place]) 06/25/2015    Patient Active Problem List   Diagnosis Date Noted   H/O total shoulder replacement, left 03/02/2021   Deficiency anemia 02/03/2021   Pancytopenia, acquired ([place]) 12/03/2020   Pulmonary hypertension ([place]) 10/27/2020   Thrombocytopenia ([place]) 09/01/2020   Goals of care, counseling/discussion 07/23/2020   Preventive measure 07/23/2020   Chronic leukopenia 09/25/2019   Syncope 03/05/2019   Postural dizziness with  presyncope 03/04/2019   Atypical chest pain    Near syncope    Acute systolic congestive heart failure (HCC)    Atrial fibrillation with RVR ([place]) 02/25/2019   GERD with esophagitis 09/19/2018   Skin lesions 06/06/2017   Dyspnea on exertion 03/25/2017   Anxiety disorder 03/25/2017   Generalized weakness 03/19/2017   Insomnia disorder 03/19/2017   Dysuria 08/31/2016   Acute back pain with sciatica, right 08/31/2016   Chronic back pain 07/02/2016   Quality of life palliative care encounter 03/02/2016   Port catheter in place 03/01/2016   Skin rash 11/03/2015   Chronic atrial fibrillation 09/08/2015   Anemia of chronic disease 07/02/2015   Essential hypertension 07/02/2015   Diabetes mellitus, likely due to steroids 06/27/2015   Chronic kidney disease, stage III (moderate) ([place]) 06/25/2015   PAF (paroxysmal atrial fibrillation) ([place]) 06/25/2015   Atrial fibrillation with rapid ventricular response ([place]) 06/25/2015   Chronic knee pain after total replacement of right knee joint 04/09/2015   Chronic left shoulder pain 03/31/2015   Other fatigue 09/62/8366   Grade 3a follicular lymphoma of lymph nodes of multiple regions ([place]) 02/03/2015   Hypothyroidism 09/09/2009    Past Surgical History:  Procedure Laterality Date   ABDOMINAL HYSTERECTOMY     APPENDECTOMY     BACK  SURGERY     X5-lumbar-fusion   COLONOSCOPY     DILATION AND CURETTAGE OF UTERUS     LYMPH NODE BIOPSY Right 01/21/2015   Procedure: RIGHT GROIN LYMPH NODE BIOPSY;  Surgeon: Erroll Luna, MD;  Location: [facility];  Service: General;  Laterality: Right;   MASS EXCISION Left 08/29/2018   Procedure: EXCISION LEFT BACK  MASS;  Surgeon: Erroll Luna, MD;  Location: [facility];  Service: General;  Laterality: Left;   Ovarian cyst resection     patelar tendon transplants     Left/right   PORTACATH PLACEMENT Right 02/13/2015   Procedure: INSERTION PORT-A-CATH WITH ULTRASOUND;  Surgeon: Erroll Luna, MD;   Location: [facility];  Service: General;  Laterality: Right;   PORTACATH PLACEMENT N/A 08/29/2018   Procedure: INSERTION PORT-A-CATH WITH ULTRA SOUND ERAS PATHWAY;  Surgeon: Erroll Luna, MD;  Location: [facility];  Service: General;  Laterality: N/A;   REVERSE SHOULDER ARTHROPLASTY Left 03/02/2021   Procedure: REVERSE SHOULDER ARTHROPLASTY;  Surgeon: Netta Cedars, MD;  Location: [facility];  Service: Orthopedics;  Laterality: Left;   TOTAL KNEE ARTHROPLASTY  2011   Right   TUBAL LIGATION      OB History   No obstetric history on file.      Home Medications    Prior to Admission medications   Medication Sig Start Date End Date Taking? Authorizing Provider  oseltamivir (TAMIFLU) 75 MG capsule Take 1 capsule (75 mg total) by mouth every 12 (twelve) hours. 09/03/21  Yes Francene Finders, PA-C  acetaminophen (TYLENOL) 500 MG tablet Take 500 mg by mouth every 6 (six) hours as needed for moderate pain or headache.    [provider]  apixaban (ELIQUIS) 5 MG TABS tablet Take 5 mg by mouth 2 (two) times daily.     [provider]  diclofenac sodium (VOLTAREN) 1 % GEL Apply 2 g topically 3 (three) times daily as needed (knee pain). 08/01/18   Quintella Reichert, MD  levothyroxine (SYNTHROID, LEVOTHROID) 112 MCG tablet Take 112 mcg by mouth daily before breakfast. 04/22/15   [provider]  lidocaine-prilocaine (EMLA) cream Apply 1 application topically daily as needed. 04/07/21   [name], MD  metoprolol succinate (TOPROL-XL) 25 MG 24 hr tablet Take 0.5 tablets (12.5 mg total) by mouth daily. Take with or immediately following a meal. 06/20/19   Lyda Jester M, PA-C  ondansetron (ZOFRAN) 8 MG tablet Take 1 tablet (8 mg total) by mouth every 8 (eight) hours as needed for refractory nausea / vomiting. Start on day 2 after bendamustine chemo. Patient not taking: Reported on 02/23/2021 07/23/20   [name], MD  oxyCODONE-acetaminophen (PERCOCET) 5-325 MG tablet Take 0.5-1 tablets by  mouth every 6 (six) hours as needed for moderate pain or severe pain. 03/02/21 03/02/22  Netta Cedars, MD  polyvinyl alcohol (LIQUIFILM TEARS) 1.4 % ophthalmic solution Place 1 drop into both eyes as needed for dry eyes.    [provider]    Family History Family History  Problem Relation Age of Onset   Cancer Mother        Breast, lung NHL   Cancer Sister        Multiple myeloma    Social History Social History   Tobacco Use   Smoking status: Never   Smokeless tobacco: Never  Vaping Use   Vaping Use: Never used  Substance Use Topics   Alcohol use: Not Currently   Drug use: No     Allergies  Amiodarone   Review of Systems Review of Systems  Constitutional:  Negative for chills and fever.  HENT:  Positive for congestion and sinus pressure. Negative for ear pain and sore throat.   Eyes:  Negative for discharge and redness.  Respiratory:  Positive for cough. Negative for shortness of breath and wheezing.   Gastrointestinal:  Negative for abdominal pain, diarrhea, nausea and vomiting.    Physical Exam Triage Vital Signs ED Triage Vitals  Enc Vitals Group     BP 09/03/21 1916 (!) 154/87     Pulse Rate 09/03/21 1916 80     Resp 09/03/21 1916 18     Temp 09/03/21 1916 98.2 F (36.8 C)     Temp Source 09/03/21 1916 Oral     SpO2 09/03/21 1916 96 %     Weight --      Height --      Head Circumference --      Peak Flow --      Pain Score 09/03/21 1917 0     Pain Loc --      Pain Edu? --      Excl. in [place]? --    No data found.  Updated Vital Signs BP (!) 154/87 (BP Location: Right Arm)   Pulse 80   Temp 98.2 F (36.8 C) (Oral)   Resp 18   SpO2 96%   Physical Exam Vitals and nursing note reviewed.  Constitutional:      General: She is not in acute distress.    Appearance: Normal appearance. She is not ill-appearing.  HENT:     Head: Normocephalic and atraumatic.     Nose: Congestion present.  Eyes:     Conjunctiva/sclera: Conjunctivae  normal.  Cardiovascular:     Rate and Rhythm: Normal rate and regular rhythm.     Heart sounds: Normal heart sounds. No murmur heard. Pulmonary:     Effort: Pulmonary effort is normal. No respiratory distress.     Breath sounds: Normal breath sounds. No wheezing, rhonchi or rales.  Skin:    General: Skin is warm and dry.  Neurological:     Mental Status: She is alert.  Psychiatric:        Mood and Affect: Mood normal.        Thought Content: Thought content normal.     UC Treatments / Results  Labs (all labs ordered are listed, but only abnormal results are displayed) Labs Reviewed  POCT INFLUENZA A/B - Abnormal; Notable for the following components:      Result Value   Influenza A, POC Positive (*)    All other components within normal limits    EKG   Radiology No results found.  Procedures Procedures (including critical care time)  Medications Ordered in UC Medications - No data to display  Initial Impression / Assessment and Plan / UC Course  I have reviewed the triage vital signs and the nursing notes.  Pertinent labs & imaging results that were available during my care of the patient were reviewed by me and considered in my medical decision making (see chart for details).  Flu test positive in office.  Will treat with Tamiflu given patient is currently taking chemo.  Encouraged follow-up if symptoms fail to improve or worsen.  Final Clinical Impressions(s) / UC Diagnoses   Final diagnoses:  Influenza A   Discharge Instructions   None    ED Prescriptions     Medication Sig Dispense Auth. Provider   oseltamivir (  TAMIFLU) 75 MG capsule Take 1 capsule (75 mg total) by mouth every 12 (twelve) hours. 10 capsule Francene Finders, PA-C      PDMP not reviewed this encounter.   Francene Finders, PA-C 09/03/21 [phone]

## 2021-09-09 ENCOUNTER — Encounter: Payer: Self-pay | Admitting: Hematology and Oncology

## 2021-09-09 ENCOUNTER — Telehealth: Payer: Self-pay | Admitting: *Deleted

## 2021-09-09 ENCOUNTER — Other Ambulatory Visit: Payer: Self-pay | Admitting: Hematology and Oncology

## 2021-09-09 ENCOUNTER — Other Ambulatory Visit (HOSPITAL_COMMUNITY): Payer: Self-pay

## 2021-09-09 DIAGNOSIS — R059 Cough, unspecified: Secondary | ICD-10-CM

## 2021-09-09 MED ORDER — HYDROCODONE BIT-HOMATROP MBR 5-1.5 MG/5ML PO SOLN
5.0000 mL | Freq: Four times a day (QID) | ORAL | 0 refills | Status: DC | PRN
  Filled 2021-09-09: qty 120, 6d supply, fill #0

## 2021-09-09 NOTE — Telephone Encounter (Signed)
Patient called. Diagnosed with flu on 09/03/21. Took course of Tamiflu as prescribed. No fever and taking OTC cough med. Feels better, but cough continues. Coughing at night. Sleeping propped on pillows at night to decrease coughing, but not sleeping well. Wants to as Dr. Alvy Bimler if there is another or better cough medicine she can take. Dr. Alvy Bimler informed of message information and question.  Contacted patient with Dr. Alvy Bimler response - will send prescription to [facility] for cough medication if patient would like to use. Patient in agreement. Dr. Alvy Bimler informed and prescription sent to [facility]. Patient notified of same and verbalized understanding.

## 2021-09-15 ENCOUNTER — Other Ambulatory Visit: Payer: Self-pay

## 2021-09-15 ENCOUNTER — Encounter: Payer: Self-pay | Admitting: Hematology and Oncology

## 2021-09-15 ENCOUNTER — Emergency Department (HOSPITAL_COMMUNITY)
Admission: EM | Admit: 2021-09-15 | Discharge: 2021-09-15 | Disposition: A | Discharge status: Home / Self Care | Payer: No Typology Code available for payment source | Attending: Emergency Medicine | Admitting: Emergency Medicine

## 2021-09-15 ENCOUNTER — Inpatient Hospital Stay: Payer: Medicare HMO | Attending: Hematology and Oncology

## 2021-09-15 ENCOUNTER — Other Ambulatory Visit (HOSPITAL_COMMUNITY): Payer: Self-pay

## 2021-09-15 ENCOUNTER — Encounter (HOSPITAL_COMMUNITY): Payer: Self-pay | Admitting: Emergency Medicine

## 2021-09-15 ENCOUNTER — Inpatient Hospital Stay: Payer: Medicare HMO

## 2021-09-15 ENCOUNTER — Inpatient Hospital Stay (HOSPITAL_BASED_OUTPATIENT_CLINIC_OR_DEPARTMENT_OTHER): Payer: Medicare HMO | Admitting: Hematology and Oncology

## 2021-09-15 ENCOUNTER — Emergency Department (HOSPITAL_COMMUNITY): Payer: No Typology Code available for payment source

## 2021-09-15 ENCOUNTER — Telehealth: Payer: Self-pay | Admitting: *Deleted

## 2021-09-15 DIAGNOSIS — Z79899 Other long term (current) drug therapy: Secondary | ICD-10-CM | POA: Diagnosis not present

## 2021-09-15 DIAGNOSIS — E1122 Type 2 diabetes mellitus with diabetic chronic kidney disease: Secondary | ICD-10-CM | POA: Insufficient documentation

## 2021-09-15 DIAGNOSIS — Z7189 Other specified counseling: Secondary | ICD-10-CM

## 2021-09-15 DIAGNOSIS — J101 Influenza due to other identified influenza virus with other respiratory manifestations: Secondary | ICD-10-CM

## 2021-09-15 DIAGNOSIS — Z8572 Personal history of non-Hodgkin lymphomas: Secondary | ICD-10-CM | POA: Diagnosis not present

## 2021-09-15 DIAGNOSIS — I4891 Unspecified atrial fibrillation: Secondary | ICD-10-CM

## 2021-09-15 DIAGNOSIS — Z7901 Long term (current) use of anticoagulants: Secondary | ICD-10-CM | POA: Insufficient documentation

## 2021-09-15 DIAGNOSIS — Z96651 Presence of right artificial knee joint: Secondary | ICD-10-CM | POA: Diagnosis not present

## 2021-09-15 DIAGNOSIS — C8238 Follicular lymphoma grade IIIa, lymph nodes of multiple sites: Secondary | ICD-10-CM | POA: Diagnosis not present

## 2021-09-15 DIAGNOSIS — Z96612 Presence of left artificial shoulder joint: Secondary | ICD-10-CM | POA: Diagnosis not present

## 2021-09-15 DIAGNOSIS — N183 Chronic kidney disease, stage 3 unspecified: Secondary | ICD-10-CM | POA: Diagnosis not present

## 2021-09-15 DIAGNOSIS — I130 Hypertensive heart and chronic kidney disease with heart failure and stage 1 through stage 4 chronic kidney disease, or unspecified chronic kidney disease: Secondary | ICD-10-CM | POA: Insufficient documentation

## 2021-09-15 DIAGNOSIS — I5021 Acute systolic (congestive) heart failure: Secondary | ICD-10-CM | POA: Diagnosis not present

## 2021-09-15 DIAGNOSIS — E039 Hypothyroidism, unspecified: Secondary | ICD-10-CM | POA: Diagnosis not present

## 2021-09-15 LAB — CBC WITH DIFFERENTIAL (CANCER CENTER ONLY)
Abs Immature Granulocytes: 0.09 10*3/uL — ABNORMAL HIGH (ref 0.00–0.07)
Basophils Absolute: 0 10*3/uL (ref 0.0–0.1)
Basophils Relative: 1 %
Eosinophils Absolute: 0.1 10*3/uL (ref 0.0–0.5)
Eosinophils Relative: 3 %
HCT: 39 % (ref 36.0–46.0)
Hemoglobin: 12.6 g/dL (ref 12.0–15.0)
Immature Granulocytes: 2 %
Lymphocytes Relative: 8 %
Lymphs Abs: 0.3 10*3/uL — ABNORMAL LOW (ref 0.7–4.0)
MCH: 30.1 pg (ref 26.0–34.0)
MCHC: 32.3 g/dL (ref 30.0–36.0)
MCV: 93.1 fL (ref 80.0–100.0)
Monocytes Absolute: 0.5 10*3/uL (ref 0.1–1.0)
Monocytes Relative: 13 %
Neutro Abs: 2.8 10*3/uL (ref 1.7–7.7)
Neutrophils Relative %: 73 %
Platelet Count: 263 10*3/uL (ref 150–400)
RBC: 4.19 MIL/uL (ref 3.87–5.11)
RDW: 13.8 % (ref 11.5–15.5)
WBC Count: 3.8 10*3/uL — ABNORMAL LOW (ref 4.0–10.5)
nRBC: 0 % (ref 0.0–0.2)

## 2021-09-15 LAB — CBC
HCT: 41.1 % (ref 36.0–46.0)
Hemoglobin: 13.6 g/dL (ref 12.0–15.0)
MCH: 31.2 pg (ref 26.0–34.0)
MCHC: 33.1 g/dL (ref 30.0–36.0)
MCV: 94.3 fL (ref 80.0–100.0)
Platelets: 326 10*3/uL (ref 150–400)
RBC: 4.36 MIL/uL (ref 3.87–5.11)
RDW: 13.7 % (ref 11.5–15.5)
WBC: 4.3 10*3/uL (ref 4.0–10.5)
nRBC: 0 % (ref 0.0–0.2)

## 2021-09-15 LAB — BASIC METABOLIC PANEL
Anion gap: 12 (ref 5–15)
BUN: 26 mg/dL — ABNORMAL HIGH (ref 8–23)
CO2: 24 mmol/L (ref 22–32)
Calcium: 9.3 mg/dL (ref 8.9–10.3)
Chloride: 102 mmol/L (ref 98–111)
Creatinine, Ser: 1.51 mg/dL — ABNORMAL HIGH (ref 0.44–1.00)
GFR, Estimated: 35 mL/min — ABNORMAL LOW (ref >60)
Glucose, Bld: 135 mg/dL — ABNORMAL HIGH (ref 70–99)
Potassium: 4.4 mmol/L (ref 3.5–5.1)
Sodium: 138 mmol/L (ref 135–145)

## 2021-09-15 LAB — CMP (CANCER CENTER ONLY)
ALT: 15 U/L (ref 0–44)
AST: 19 U/L (ref 15–41)
Albumin: 3.9 g/dL (ref 3.5–5.0)
Alkaline Phosphatase: 78 U/L (ref 38–126)
Anion gap: 9 (ref 5–15)
BUN: 30 mg/dL — ABNORMAL HIGH (ref 8–23)
CO2: 27 mmol/L (ref 22–32)
Calcium: 8.9 mg/dL (ref 8.9–10.3)
Chloride: 101 mmol/L (ref 98–111)
Creatinine: 1.38 mg/dL — ABNORMAL HIGH (ref 0.44–1.00)
GFR, Estimated: 39 mL/min — ABNORMAL LOW (ref >60)
Glucose, Bld: 161 mg/dL — ABNORMAL HIGH (ref 70–99)
Potassium: 4.1 mmol/L (ref 3.5–5.1)
Sodium: 137 mmol/L (ref 135–145)
Total Bilirubin: 0.9 mg/dL (ref 0.3–1.2)
Total Protein: 6.4 g/dL — ABNORMAL LOW (ref 6.5–8.1)

## 2021-09-15 LAB — MAGNESIUM: Magnesium: 2.2 mg/dL (ref 1.7–2.4)

## 2021-09-15 MED ORDER — FENTANYL CITRATE PF 50 MCG/ML IJ SOSY
25.0000 ug | PREFILLED_SYRINGE | Freq: Once | INTRAMUSCULAR | Status: AC
  Administered 2021-09-15: 25 ug via INTRAVENOUS

## 2021-09-15 MED ORDER — HYDROCODONE BIT-HOMATROP MBR 5-1.5 MG/5ML PO SOLN
5.0000 mL | Freq: Four times a day (QID) | ORAL | 0 refills | Status: DC | PRN
  Filled 2021-09-15: qty 240, 12d supply, fill #0

## 2021-09-15 MED ORDER — ETOMIDATE 2 MG/ML IV SOLN
INTRAVENOUS | Status: AC | PRN
  Administered 2021-09-15: 10 mg via INTRAVENOUS

## 2021-09-15 MED ORDER — ETOMIDATE 2 MG/ML IV SOLN
10.0000 mg | Freq: Once | INTRAVENOUS | Status: AC
  Administered 2021-09-15: 10 mg via INTRAVENOUS

## 2021-09-15 MED ORDER — FENTANYL CITRATE (PF) 100 MCG/2ML IJ SOLN
INTRAMUSCULAR | Status: AC | PRN
  Administered 2021-09-15: 25 ug via INTRAVENOUS

## 2021-09-15 MED ORDER — METOPROLOL TARTRATE 5 MG/5ML IV SOLN
5.0000 mg | INTRAVENOUS | Status: AC | PRN
  Administered 2021-09-15 (×3): 5 mg via INTRAVENOUS

## 2021-09-15 MED ORDER — HEPARIN SOD (PORK) LOCK FLUSH 100 UNIT/ML IV SOLN
500.0000 [IU] | Freq: Once | INTRAVENOUS | Status: AC
Start: 2021-09-15 — End: 2021-09-15
  Administered 2021-09-15: 500 [IU]

## 2021-09-15 MED ORDER — SODIUM CHLORIDE 0.9% FLUSH
10.0000 mL | Freq: Once | INTRAVENOUS | Status: AC
  Administered 2021-09-15: 10 mL

## 2021-09-15 NOTE — Telephone Encounter (Signed)
Patient called and left message: she is on her way home from ED. She said they shocked her twice and she is not having afib anymore. She said to tell Dr. Alvy Bimler thank you very very much for taking such good care of her.  Dr. Alvy Bimler informed of patient's message

## 2021-09-15 NOTE — ED Provider Notes (Signed)
[facility] EMERGENCY DEPARTMENT Provider Note   CSN: [id number] Arrival date & time: 09/15/21  1046     History Chief Complaint  Patient presents with   Atrial Fibrillation    Carrie [name] is a 80 y.o. female.  HPI Patient presents with concern of a dyspnea, palpitations, fatigue.  When she went to chemotherapy today.  She is receiving maintenance chemotherapy for non-Hodgkin's lymphoma.  She has been doing generally well, but today, even before going to her appointment she felt her symptoms as above.  There she was found to have tachycardia, and sent here for evaluation.  She does note ongoing mild cough, though notes this is unusual not unusual for her when she has had URI-like illness and she recently had influenza.  No true chest pain, no syncope.  She is here with female companion who assists with the history.    Past Medical History:  Diagnosis Date   A-fib [facility])    Anemia    pt denies    Atrial fibrillation with rapid ventricular response ([place]) 06/25/2015   Carpal tunnel syndrome, bilateral    pt denies    Cervical spondylosis without myelopathy 10/25/2013   Dental bridge present    DJD (degenerative joint disease)    Dyspnea    sometimes hx of cancer non hodgkins lymphoma    Dysrhythmia    WENT INTO A FIB IN 2017    Elevated troponin 06/27/3715   Follicular lymphoma grade 3a ([place]) 02/03/2015   hx of nonhodgkins lymphoma x 3    Headache    sometimes a headache    History of echocardiogram    Echo 12/16: EF 60-65%, no RWMA, severe LAE   History of nuclear stress test    Myoview 1/17: EF 55%, Normal study. No ischemia or scar.   Hypertension    Hypothyroid    Nausea without vomiting 06/20/2015   Stage III chronic kidney disease ([place]) 07/01/2015   Thrush of mouth and esophagus ([place]) 06/25/2015    Patient Active Problem List   Diagnosis Date Noted   Influenza A 09/15/2021   Cough in adult 09/09/2021   H/O total shoulder replacement, left 03/02/2021    Deficiency anemia 02/03/2021   Pancytopenia, acquired ([place]) 12/03/2020   Pulmonary hypertension ([place]) 10/27/2020   Thrombocytopenia ([place]) 09/01/2020   Goals of care, counseling/discussion 07/23/2020   Preventive measure 07/23/2020   Chronic leukopenia 09/25/2019   Syncope 03/05/2019   Postural dizziness with presyncope 03/04/2019   Atypical chest pain    Near syncope    Acute systolic congestive heart failure (HCC)    Atrial fibrillation with RVR ([place]) 02/25/2019   GERD with esophagitis 09/19/2018   Skin lesions 06/06/2017   Dyspnea on exertion 03/25/2017   Anxiety disorder 03/25/2017   Generalized weakness 03/19/2017   Insomnia disorder 03/19/2017   Dysuria 08/31/2016   Acute back pain with sciatica, right 08/31/2016   Chronic back pain 07/02/2016   Quality of life palliative care encounter 03/02/2016   Port catheter in place 03/01/2016   Skin rash 11/03/2015   Chronic atrial fibrillation 09/08/2015   Anemia of chronic disease 07/02/2015   Essential hypertension 07/02/2015   Diabetes mellitus, likely due to steroids 06/27/2015   Chronic kidney disease, stage III (moderate) ([place]) 06/25/2015   PAF (paroxysmal atrial fibrillation) ([place]) 06/25/2015   Atrial fibrillation with rapid ventricular response ([place]) 06/25/2015   Chronic knee pain after total replacement of right knee joint 04/09/2015   Chronic left shoulder pain  03/31/2015   Other fatigue 49/67/5916   Grade 3a follicular lymphoma of lymph nodes of multiple regions ([place]) 02/03/2015   Hypothyroidism 09/09/2009    Past Surgical History:  Procedure Laterality Date   ABDOMINAL HYSTERECTOMY     APPENDECTOMY     BACK SURGERY     X5-lumbar-fusion   COLONOSCOPY     DILATION AND CURETTAGE OF UTERUS     LYMPH NODE BIOPSY Right 01/21/2015   Procedure: RIGHT GROIN LYMPH NODE BIOPSY;  Surgeon: Erroll Luna, MD;  Location: [place];  Service: General;  Laterality: Right;   MASS EXCISION Left 08/29/2018    Procedure: EXCISION LEFT BACK  MASS;  Surgeon: Erroll Luna, MD;  Location: [place];  Service: General;  Laterality: Left;   Ovarian cyst resection     patelar tendon transplants     Left/right   PORTACATH PLACEMENT Right 02/13/2015   Procedure: INSERTION PORT-A-CATH WITH ULTRASOUND;  Surgeon: Erroll Luna, MD;  Location: [place];  Service: General;  Laterality: Right;   PORTACATH PLACEMENT N/A 08/29/2018   Procedure: INSERTION PORT-A-CATH WITH ULTRA SOUND ERAS PATHWAY;  Surgeon: Erroll Luna, MD;  Location: [place];  Service: General;  Laterality: N/A;   REVERSE SHOULDER ARTHROPLASTY Left 03/02/2021   Procedure: REVERSE SHOULDER ARTHROPLASTY;  Surgeon: Netta Cedars, MD;  Location: [facility];  Service: Orthopedics;  Laterality: Left;   TOTAL KNEE ARTHROPLASTY  2011   Right   TUBAL LIGATION       OB History   No obstetric history on file.     Family History  Problem Relation Age of Onset   Cancer Mother        Breast, lung NHL   Cancer Sister        Multiple myeloma    Social History   Tobacco Use   Smoking status: Never   Smokeless tobacco: Never  Vaping Use   Vaping Use: Never used  Substance Use Topics   Alcohol use: Not Currently   Drug use: No    Home Medications Prior to Admission medications   Medication Sig Start Date End Date Taking? Authorizing Provider  acetaminophen (TYLENOL) 500 MG tablet Take 500 mg by mouth every 6 (six) hours as needed for moderate pain or headache.    [provider]  apixaban (ELIQUIS) 5 MG TABS tablet Take 5 mg by mouth 2 (two) times daily.     [provider]  diclofenac sodium (VOLTAREN) 1 % GEL Apply 2 g topically 3 (three) times daily as needed (knee pain). 08/01/18   Quintella Reichert, MD  HYDROcodone bit-homatropine [facility]) 5-1.5 MG/5ML syrup Take 5 mLs by mouth every 6 (six) hours as needed for cough. 09/15/21   [name], MD  levothyroxine (SYNTHROID, LEVOTHROID) 112 MCG tablet Take 112 mcg by mouth daily  before breakfast. 04/22/15   [provider]  lidocaine-prilocaine (EMLA) cream Apply 1 application topically daily as needed. 04/07/21   [name], MD  metoprolol succinate (TOPROL-XL) 25 MG 24 hr tablet Take 0.5 tablets (12.5 mg total) by mouth daily. Take with or immediately following a meal. 06/20/19   Lyda Jester M, PA-C  ondansetron (ZOFRAN) 8 MG tablet Take 1 tablet (8 mg total) by mouth every 8 (eight) hours as needed for refractory nausea / vomiting. Start on day 2 after bendamustine chemo. Patient not taking: Reported on 02/23/2021 07/23/20   [name], MD  oxyCODONE-acetaminophen (PERCOCET) 5-325 MG tablet Take 0.5-1 tablets by mouth every 6 (six) hours as needed for  moderate pain or severe pain. 03/02/21 03/02/22  Netta Cedars, MD  polyvinyl alcohol (LIQUIFILM TEARS) 1.4 % ophthalmic solution Place 1 drop into both eyes as needed for dry eyes.    [provider]    Allergies    Amiodarone  Review of Systems   Review of Systems  Constitutional:        Per HPI, otherwise negative  HENT:         Per HPI, otherwise negative  Respiratory:         Per HPI, otherwise negative  Cardiovascular:        Per HPI, otherwise negative  Gastrointestinal:  Negative for vomiting.  Endocrine:       Negative aside from HPI  Genitourinary:        Neg aside from HPI   Musculoskeletal:        Per HPI, otherwise negative  Skin: Negative.   Neurological:  Negative for syncope.   Physical Exam Updated Vital Signs BP 123/87   Pulse 66   Temp 98 F (36.7 C) (Oral)   Resp (!) 28   SpO2 96%   Physical Exam Vitals and nursing note reviewed.  Constitutional:      General: She is not in acute distress.    Appearance: She is well-developed.  HENT:     Head: Normocephalic and atraumatic.  Eyes:     Conjunctiva/sclera: Conjunctivae normal.  Cardiovascular:     Rate and Rhythm: Regular rhythm. Tachycardia present.  Pulmonary:     Effort: Pulmonary effort is normal. No  respiratory distress.     Breath sounds: Normal breath sounds. No stridor.  Chest:    Abdominal:     General: There is no distension.  Skin:    General: Skin is warm and dry.  Neurological:     Mental Status: She is alert and oriented to person, place, and time.     Cranial Nerves: No cranial nerve deficit.    ED Results / Procedures / Treatments   Labs (all labs ordered are listed, but only abnormal results are displayed) Labs Reviewed  BASIC METABOLIC PANEL - Abnormal; Notable for the following components:      Result Value   Glucose, Bld 135 (*)    BUN 26 (*)    Creatinine, Ser 1.51 (*)    GFR, Estimated 35 (*)    All other components within normal limits  CBC  MAGNESIUM    EKG None  Radiology No results found.  Procedures .Cardioversion  Date/Time: 09/15/2021 1:32 PM Performed by: Carmin Muskrat, MD Authorized by: Carmin Muskrat, MD   Consent:    Consent obtained:  Verbal   Consent given by:  Patient   Risks discussed:  Induced arrhythmia and pain   Alternatives discussed:  Rate-control medication and alternative treatment Universal protocol:    Procedure explained and questions answered to patient or proxy's satisfaction: yes     Relevant documents present and verified: yes     Test results available and properly labeled: yes     Imaging studies available: yes     Required blood products, implants, devices, and special equipment available: yes     Site/side marked: yes     Immediately prior to procedure a time out was called: yes     Patient identity confirmed:  Verbally with patient Pre-procedure details:    Cardioversion basis:  Emergent   Rhythm:  Atrial fibrillation   Electrode placement:  Anterior-posterior Patient sedated: Yes. Refer to sedation procedure  documentation for details of sedation.  Attempt one:    Cardioversion mode:  Synchronous   Waveform:  Biphasic   Shock (Joules):  120   Shock outcome:  Conversion to normal sinus  rhythm Post-procedure details:    Patient status:  Awake   Patient tolerance of procedure:  Tolerated well, no immediate complications .Sedation  Date/Time: 09/15/2021 1:33 PM Performed by: Carmin Muskrat, MD Authorized by: Carmin Muskrat, MD   Consent:    Consent obtained:  Verbal   Consent given by:  Patient   Risks discussed:  Dysrhythmia, inadequate sedation, nausea and prolonged hypoxia resulting in organ damage   Alternatives discussed:  Analgesia without sedation Universal protocol:    Procedure explained and questions answered to patient or proxy's satisfaction: yes     Relevant documents present and verified: yes     Test results available: yes     Imaging studies available: yes     Required blood products, implants, devices, and special equipment available: yes     Site/side marked: yes     Immediately prior to procedure, a time out was called: yes     Patient identity confirmed:  Verbally with patient Indications:    Procedure performed:  Cardioversion   Procedure necessitating sedation performed by:  Physician performing sedation Pre-sedation assessment:    Time since last food or drink:  4   ASA classification: class 2 - patient with mild systemic disease     Mouth opening:  2 finger widths   Thyromental distance:  2 finger widths   Mallampati score:  II - soft palate, uvula, fauces visible   Neck mobility: normal     Pre-sedation assessments completed and reviewed: airway patency, cardiovascular function, hydration status, mental status, nausea/vomiting, pain level, respiratory function and temperature     Pre-sedation assessment completed:  09/15/2021 12:00 PM Immediate pre-procedure details:    Reassessment: Patient reassessed immediately prior to procedure     Reviewed: vital signs, relevant labs/tests and NPO status     Verified: bag valve mask available, emergency equipment available, intubation equipment available, IV patency confirmed, oxygen available,  reversal medications available and suction available   Procedure details (see MAR for exact dosages):    Preoxygenation:  Nasal cannula   Sedation:  Etomidate   Intended level of sedation: deep   Analgesia:  Fentanyl   Intra-procedure monitoring:  Blood pressure monitoring, cardiac monitor, continuous pulse oximetry, continuous capnometry, frequent LOC assessments and frequent vital sign checks   Intra-procedure events: none     Total Provider sedation time (minutes):  20 Post-procedure details:    Post-sedation assessment completed:  09/15/2021 1:34 PM   Attendance: Constant attendance by certified staff until patient recovered     Recovery: Patient returned to pre-procedure baseline     Post-sedation assessments completed and reviewed: airway patency, cardiovascular function, hydration status, mental status, nausea/vomiting, pain level, respiratory function and temperature     Patient is stable for discharge or admission: yes     Procedure completion:  Tolerated well, no immediate complications   Medications Ordered in ED Medications  fentaNYL (SUBLIMAZE) injection (25 mcg Intravenous Given 09/15/21 1306)  etomidate (AMIDATE) injection (10 mg Intravenous Given 09/15/21 1306)  metoprolol tartrate (LOPRESSOR) injection 5 mg (5 mg Intravenous Given 09/15/21 1217)  fentaNYL (SUBLIMAZE) injection 25 mcg (25 mcg Intravenous Given 09/15/21 1303)  etomidate (AMIDATE) injection 10 mg (10 mg Intravenous Given 09/15/21 1303)    ED Course  I have reviewed the triage vital signs and  the nursing notes.  Pertinent labs & imaging results that were available during my care of the patient were reviewed by me and considered in my medical decision making (see chart for details).  Cardiac 150 A. fib abnormal Pulse ox 98% room air normal   Initial evaluation with consideration of A. fib, rapid ventricular response, and ongoing symptoms we discussed interventions.  Patient will try beta-blocker  x3.  Update:, 3 doses beta-blocker did not result in change in her clinical status, nor appreciably in her heart rate.  We discussed indications for cardioversion she notes that she has had this successfully once in the past. 1:32 PM Patient has tolerated electrocardioversion without complication after consent was obtained.  3:07 PM Patient in no distress.  She is awake, alert, states that she is hungry. MDM Rules/Calculators/A&P L female with history of paroxysmal A. fib on anticoagulation presents with A. fib and symptoms. Patient is awake and alert, but complains of fatigue, dyspnea, palpitations.  After initial interventions with medications did not result in cardioversion patient had successful cardioversion. Patient was monitored for several hours, with otherwise unremarkable labs, and with return to unremarkable hemodynamic status, patient discharged to follow-up with A. fib clinic and primary care physician. MDM Number of Diagnoses or Management Options Atrial fibrillation with RVR ([place]): new, needed workup   Amount and/or Complexity of Data Reviewed Clinical lab tests: ordered and reviewed Tests in the radiology section of CPT: ordered and reviewed Tests in the medicine section of CPT: ordered and reviewed Decide to obtain previous medical records or to obtain history from someone other than the patient: yes Obtain history from someone other than the patient: yes Review and summarize past medical records: yes Independent visualization of images, tracings, or specimens: yes  Risk of Complications, Morbidity, and/or Mortality Presenting problems: high Diagnostic procedures: high Management options: high  Critical Care Total time providing critical care: 30-74 minutes (35)  Patient Progress Patient progress: stable   Final Clinical Impression(s) / ED Diagnoses Final diagnoses:  Atrial fibrillation with RVR [facility])    Rx / DC Orders ED Discharge Orders           Ordered    Amb referral to AFIB Clinic        09/15/21 1157             Carmin Muskrat, MD 09/15/21 1507

## 2021-09-15 NOTE — Discharge Instructions (Signed)
As discussed, with your ongoing A. fib and studies episode of rapid A. fib it is important you follow-up with your cardiologist as soon as possible for consideration of your medication regimen.  Return here for concerning changes in your condition.

## 2021-09-15 NOTE — ED Notes (Signed)
Pt provided discharge instructions and prescription information. Pt was given the opportunity to ask questions and questions were answered. Discharge signature not obtained in the setting of the COVID-19 pandemic in order to reduce high touch surfaces.  ° °

## 2021-09-15 NOTE — Assessment & Plan Note (Signed)
The patient is unstable to proceed with treatment We will reschedule her treatment and delay a minimum of 2 weeks to allow recovery

## 2021-09-15 NOTE — Assessment & Plan Note (Signed)
She contracted influenza A infection recently and was treated with Tamiflu She continues to have profound productive cough and shortness of breath I have refilled her prescription of cough syrup but I suspect her shortness of breath is due to her cardiac condition She is directed to the emergency department for further evaluation and management

## 2021-09-15 NOTE — ED Notes (Signed)
Pt teaching provided on medications that may cause drowsiness. Pt instructed not to drive or operate heavy machinery while taking the prescribed medication. Pt verbalized understanding.   

## 2021-09-15 NOTE — Progress Notes (Signed)
[place] OFFICE PROGRESS NOTE  Patient Care Team: [facility], [name] as PCP - General [name], MD (Inactive) as PCP - Cardiology (Cardiology) [name], MD as Attending Physician (Internal Medicine)  ASSESSMENT & PLAN:  Grade 3a follicular lymphoma of lymph nodes of multiple regions [facility]) The patient is unstable to proceed with treatment We will reschedule her treatment and delay a minimum of 2 weeks to allow recovery  Atrial fibrillation with rapid ventricular response ([place]) She developed atrial fibrillation with rapid ventricular response, likely triggered by recent influenza infection Her blood pressure is adequate but I am concerned given her significant cardiovascular history, she could develop acute heart failure I spoke with triage nurse at [facility] emergency room; unfortunately, we are not able to expedite her evaluation I have directed the patient and her husband to the emergency department for further evaluation  Influenza A She contracted influenza A infection recently and was treated with Tamiflu She continues to have profound productive cough and shortness of breath I have refilled her prescription of cough syrup but I suspect her shortness of breath is due to her cardiac condition She is directed to the emergency department for further evaluation and management  No orders of the defined types were placed in this encounter.   All questions were answered. The patient knows to call the clinic with any problems, questions or concerns. The total time spent in the appointment was 40 minutes encounter with patients including review of chart and various tests results, discussions about plan of care and coordination of care plan   [name], MD 09/15/2021 10:29 AM  INTERVAL HISTORY: Please see below for problem oriented charting. she returns for treatment follow-up with her husband due for maintenance rituximab for history of  follicular lymphoma She developed influenza A infection 2 weeks ago and was placed on Tamiflu and cough syrup She is not feeling well She is short of breath and have difficulties finishing sentence She denies fever chills Denies chest pain no dizziness She continues to have mild productive cough  REVIEW OF SYSTEMS:   Constitutional: Denies fevers, chills or abnormal weight loss Eyes: Denies blurriness of vision Ears, nose, mouth, throat, and face: Denies mucositis or sore throat Gastrointestinal:  Denies nausea, heartburn or change in bowel habits Skin: Denies abnormal skin rashes Lymphatics: Denies new lymphadenopathy or easy bruising Neurological:Denies numbness, tingling or new weaknesses Behavioral/Psych: Mood is stable, no new changes  All other systems were reviewed with the patient and are negative.  I have reviewed the past medical history, past surgical history, social history and family history with the patient and they are unchanged from previous note.  ALLERGIES:  is allergic to amiodarone.  MEDICATIONS:  Current Outpatient Medications  Medication Sig Dispense Refill   acetaminophen (TYLENOL) 500 MG tablet Take 500 mg by mouth every 6 (six) hours as needed for moderate pain or headache.     apixaban (ELIQUIS) 5 MG TABS tablet Take 5 mg by mouth 2 (two) times daily.      diclofenac sodium (VOLTAREN) 1 % GEL Apply 2 g topically 3 (three) times daily as needed (knee pain). 100 g 0   HYDROcodone bit-homatropine (HYCODAN) 5-1.5 MG/5ML syrup Take 5 mLs by mouth every 6 (six) hours as needed for cough. 240 mL 0   levothyroxine (SYNTHROID, LEVOTHROID) 112 MCG tablet Take 112 mcg by mouth daily before breakfast.  9   lidocaine-prilocaine (EMLA) cream Apply 1 application topically daily as needed. 30 g 3  metoprolol succinate (TOPROL-XL) 25 MG 24 hr tablet Take 0.5 tablets (12.5 mg total) by mouth daily. Take with or immediately following a meal. 90 tablet 3   ondansetron (ZOFRAN)  8 MG tablet Take 1 tablet (8 mg total) by mouth every 8 (eight) hours as needed for refractory nausea / vomiting. Start on day 2 after bendamustine chemo. (Patient not taking: Reported on 02/23/2021) 30 tablet 1   oxyCODONE-acetaminophen (PERCOCET) 5-325 MG tablet Take 0.5-1 tablets by mouth every 6 (six) hours as needed for moderate pain or severe pain. 20 tablet 0   polyvinyl alcohol (LIQUIFILM TEARS) 1.4 % ophthalmic solution Place 1 drop into both eyes as needed for dry eyes.     No current facility-administered medications for this visit.   Facility-Administered Medications Ordered in Other Visits  Medication Dose Route Frequency Provider Last Rate Last Admin   ondansetron (ZOFRAN) 8 mg in sodium chloride 0.9 % 50 mL IVPB   Intravenous Once Brayley Mackowiak, MD       sodium chloride 0.9 % injection 10 mL  10 mL Intravenous PRN Alvy Bimler, Kailin Leu, MD   10 mL at 04/05/17 1315   sodium chloride flush (NS) 0.9 % injection 10 mL  10 mL Intracatheter Once [name], MD        SUMMARY OF ONCOLOGIC HISTORY: Oncology History  Grade 3a follicular lymphoma of lymph nodes of multiple regions ([place])  01/21/2015 Surgery   She underwent excisional lymph node biopsy that came back follicular lymphoma grade 3   01/21/2015 Pathology Results   Accession: [id number] biopsy confirmed follicular lymphoma   0/07/711 Imaging   Echocardiogram showed ejection fraction of 55-60%   02/11/2015 Imaging    PET CT scan show possible splenic involvement and diffuse lymphadenopathy throughout   02/11/2015 Bone Marrow Biopsy    bone marrow biopsy was performed and is involved by lymphoma with translocation of igH/BCL2   02/13/2015 Procedure   She had port placement.   02/17/2015 - 06/02/2015 Chemotherapy   She received R-CHOP chemo x 6   02/17/2015 Adverse Reaction   She had mild infusion reaction with cycle 1 of treatment.   04/18/2015 Imaging   PET CT scan showed near complete response to treatment.   04/22/2015 Adverse Reaction    Vincristine dose was reduced by 50% due to neuropathy from cycle 4 onwards   06/25/2015 - 07/06/2015 Hospital Admission   She was hospitalized for recent sepsis/bacteremia and a fib with RVR   07/11/2015 Imaging   repeat PEt scan showed complete response to Rx   07/14/2015 - 06/16/2017 Chemotherapy   She received maintenance Rituximab every 60 days   12/15/2015 Imaging   PET CT showed no evidence of cancer recurrence   06/15/2016 PET scan   No evidence of active lymphoma on skullbase to thigh FDG PET scan. Small LEFT periaortic retroperitoneal lymph nodes without significant metabolic activity ( [place] 1). No change from prior   03/24/2017 Imaging   1. Borderline enlarged abdominal retroperitoneal lymph nodes, stable. No new adenopathy in the chest, abdomen or pelvis. 2.  Aortic atherosclerosis (ICD10-170.0). 3. Enlarged pulmonary arteries, indicative of pulmonary arterial hypertension.   09/14/2017 PET scan   Stable exam. No evidence of active lymphoma or other acute findings. (Deauville score 1)   08/08/2018 PET scan   1. Evidence of progressive/recurrent disease, as evidenced by new hypermetabolic nodes and subcutaneous nodule/nodes throughout left chest and abdomen. (Deauville 5). 2. Likely physiologic right nasopharyngeal hypermetabolism. Recommend attention on follow-up. 3. Incidental findings, including  pulmonary artery enlargement, suggesting pulmonary arterial hypertension, stable left lower lobe pulmonary nodule, and aortic Atherosclerosis (ICD10-I70.0).   08/29/2018 Initial Biopsy   Soft tissue simple excision left back: Follicular lymphoma grade 1-2 positive for CD20, PAX5, bcl-6, and bcl-2.  Ki-67 30%   09/11/2018 - 02/20/2019 Chemotherapy   The patient had Bendamustine and Rituximab   12/13/2018 Imaging   1. Response to therapy, as evidenced by resolution of left chest wall nodularity and decrease in size of small left axillary nodes. 2. No residual soft tissue  thickening at the site of hypermetabolism along the posterior left eleventh rib. Resolution of adjacent subcutaneous nodularity. 3. No new or progressive disease identified. 4.  Aortic Atherosclerosis (ICD10-I70.0). 5. Bilateral pulmonary nodules, similar. 6. Trace air within the urinary bladder could be iatrogenic. Possible pericystic edema. Correlate with symptoms of cystitis and recent instrumentation.   07/30/2019 Imaging   Ct chest, abdomen and pelvis No findings suspicious for active lymphoma in the chest, abdomen, or pelvis.   Small bilateral pulmonary nodules, unchanged, benign.   Again noted is trace nondependent gas within the bladder. Correlate for recent intervention.   07/09/2020 Imaging   MRI imaging performed at the [facility] on the left shoulder reviewed significant lymphadenopathy, worrisome for recurrent lymphoma   07/23/2020 Imaging   1. Recurrent lymphoma as evidenced by enlarging left pectoral and abdominal retroperitoneal lymph nodes. Small left supraclavicular lymph nodes are new. 2. Avascular necrosis in the right femoral head with secondary degenerative osteoarthritis in the right hip. 3.  Aortic atherosclerosis (ICD10-I70.0). 4. Enlarged pulmonic trunk, indicative of pulmonary arterial hypertension.     08/04/2020 -  Chemotherapy   The patient had Bendamustine and Rituxan   10/24/2020 Imaging   1. Resolution of left supraclavicular and left axillary adenopathy. No residual measurable disease suggesting an excellent response to treatment. 2. Significant interval improvement in retroperitoneal lymph nodes. No new or progressive findings. 3. Stable mild emphysematous changes. 4. Enlarged pulmonary arteries suggesting pulmonary hypertension. 5. Stable small low-attenuation splenic lesion, likely benign cyst. 6. Emphysema and aortic atherosclerosis.   02/02/2021 Imaging   1. Stable examination with unchanged small retroperitoneal lymph nodes. No new or enlarging  pathologically enlarged lymph nodes in the chest, abdomen or pelvis. 2. Stable enlargement of the central pulmonary arteries, which can be seen with pulmonary arterial hypertension. 3. Emphysema and aortic atherosclerosis.     05/11/2021 -  Chemotherapy    Patient is on Treatment Plan: NON-HODGKINS LYMPHOMA RITUXIMAB Q60D MAINTENANCE       07/09/2021 Imaging   No new or enlarging lymph nodes seen in the chest, abdomen or pelvis.   Stable enlarged main pulmonary artery which can be seen in the setting of pulmonary hypertension.   Aortic Atherosclerosis (ICD10-I70.0) and Emphysema (ICD10-J43.9).     PHYSICAL EXAMINATION: ECOG PERFORMANCE STATUS: 2 - Symptomatic, <50% confined to bed  Vitals:   09/15/21 1001  BP: 114/67  Pulse: (!) 56  Resp: 18  Temp: 97.8 F (36.6 C)  SpO2: 97%   Filed Weights   09/15/21 1001  Weight: 139 lb 3.2 oz (63.1 kg)    GENERAL:alert, no distress and comfortable SKIN: skin color, texture, turgor are normal, no rashes or significant lesions EYES: normal, Conjunctiva are pink and non-injected, sclera clear OROPHARYNX:no exudate, no erythema and lips, buccal mucosa, and tongue normal  NECK: supple, thyroid normal size, non-tender, without nodularity LYMPH:  no palpable lymphadenopathy in the cervical, axillary or inguinal LUNGS: clear to auscultation and percussion with normal breathing effort,  but she has nonstop coughing to the point of vomiting in my office HEART: Irregular rate and rhythm with tachycardia, confirmed on EKG with A. fib with RVR ABDOMEN:abdomen soft, non-tender and normal bowel sounds Musculoskeletal:no cyanosis of digits and no clubbing  NEURO: alert & oriented x 3 with fluent speech, no focal motor/sensory deficits  LABORATORY DATA:  I have reviewed the data as listed    Component Value Date/Time   NA 143 07/08/2021 0935   NA 143 07/16/2019 1132   NA 142 08/29/2017 0838   K 4.4 07/08/2021 0935   K 4.2 08/29/2017 0838    CL 106 07/08/2021 0935   CO2 24 07/08/2021 0935   CO2 23 08/29/2017 0838   GLUCOSE 119 (H) 07/08/2021 0935   GLUCOSE 97 08/29/2017 0838   BUN 25 (H) 07/08/2021 0935   BUN 27 07/16/2019 1132   BUN 24.0 08/29/2017 0838   CREATININE 1.38 (H) 07/08/2021 0935   CREATININE 1.0 08/29/2017 0838   CALCIUM 9.4 07/08/2021 0935   CALCIUM 8.9 08/29/2017 0838   PROT 6.6 07/08/2021 0935   PROT 6.4 08/29/2017 0838   ALBUMIN 4.2 07/08/2021 0935   ALBUMIN 3.8 08/29/2017 0838   AST 19 07/08/2021 0935   AST 18 08/29/2017 0838   ALT 10 07/08/2021 0935   ALT 16 08/29/2017 0838   ALKPHOS 89 07/08/2021 0935   ALKPHOS 93 08/29/2017 0838   BILITOT 0.8 07/08/2021 0935   BILITOT 0.33 08/29/2017 0838   GFRNONAA 39 (L) 07/08/2021 0935   GFRAA 58 (L) 05/26/2020 1103   GFRAA 39 (L) 07/30/2019 1010    No results found for: SPEP, UPEP  Lab Results  Component Value Date   WBC 3.8 (L) 09/15/2021   NEUTROABS 2.8 09/15/2021   HGB 12.6 09/15/2021   HCT 39.0 09/15/2021   MCV 93.1 09/15/2021   PLT 263 09/15/2021      Chemistry      Component Value Date/Time   NA 143 07/08/2021 0935   NA 143 07/16/2019 1132   NA 142 08/29/2017 0838   K 4.4 07/08/2021 0935   K 4.2 08/29/2017 0838   CL 106 07/08/2021 0935   CO2 24 07/08/2021 0935   CO2 23 08/29/2017 0838   BUN 25 (H) 07/08/2021 0935   BUN 27 07/16/2019 1132   BUN 24.0 08/29/2017 0838   CREATININE 1.38 (H) 07/08/2021 0935   CREATININE 1.0 08/29/2017 0838      Component Value Date/Time   CALCIUM 9.4 07/08/2021 0935   CALCIUM 8.9 08/29/2017 0838   ALKPHOS 89 07/08/2021 0935   ALKPHOS 93 08/29/2017 0838   AST 19 07/08/2021 0935   AST 18 08/29/2017 0838   ALT 10 07/08/2021 0935   ALT 16 08/29/2017 0838   BILITOT 0.8 07/08/2021 0935   BILITOT 0.33 08/29/2017 1700

## 2021-09-15 NOTE — Progress Notes (Signed)
[facility] consulted to access patient's port per her request. Upon arrival to pt's bedside noted PIV in place. Pt reported she does not need her port accessed now as she might be going home in a few hours. Spoke with patient's nurse who stated use of PIV is appropriate for now. Educated that if IV no longer working, IVT consult can be placed and port can be accessed at that time.

## 2021-09-15 NOTE — ED Triage Notes (Signed)
Pt went for her chemo treatment this morning and was in Afib.  Reports heart racing and SOB x 1 hour.  Cough since diagnosed with flu 11/17.  Denies pain.

## 2021-09-15 NOTE — Sedation Documentation (Signed)
Shocked at 120jules

## 2021-09-15 NOTE — ED Notes (Signed)
Triage RN made aware of pts vital signs. Pt to be taken back to next triage room.

## 2021-09-15 NOTE — ED Notes (Signed)
MD notified about pts HR  

## 2021-09-15 NOTE — Assessment & Plan Note (Signed)
She developed atrial fibrillation with rapid ventricular response, likely triggered by recent influenza infection Her blood pressure is adequate but I am concerned given her significant cardiovascular history, she could develop acute heart failure I spoke with triage nurse at [facility] emergency room; unfortunately, we are not able to expedite her evaluation I have directed the patient and her husband to the emergency department for further evaluation

## 2021-09-16 ENCOUNTER — Other Ambulatory Visit (HOSPITAL_COMMUNITY): Payer: Self-pay

## 2021-09-17 ENCOUNTER — Telehealth: Payer: Self-pay | Admitting: Hematology and Oncology

## 2021-09-17 NOTE — Telephone Encounter (Signed)
Scheduled appointments per 11/29 sch msg. Patient aware. Patient will call back if she can not make it.

## 2021-09-22 ENCOUNTER — Encounter (HOSPITAL_COMMUNITY): Payer: Self-pay | Admitting: Physician Assistant

## 2021-09-22 ENCOUNTER — Ambulatory Visit (HOSPITAL_COMMUNITY)
Admission: RE | Admit: 2021-09-22 | Discharge: 2021-09-22 | Disposition: A | Discharge status: Home / Self Care | Payer: Medicare HMO | Source: Ambulatory Visit | Attending: Physician Assistant | Admitting: Physician Assistant

## 2021-09-22 ENCOUNTER — Other Ambulatory Visit: Payer: Self-pay

## 2021-09-22 VITALS — BP 144/90 | HR 60 | Ht 61.0 in | Wt 139.4 lb

## 2021-09-22 DIAGNOSIS — R053 Chronic cough: Secondary | ICD-10-CM | POA: Insufficient documentation

## 2021-09-22 DIAGNOSIS — N189 Chronic kidney disease, unspecified: Secondary | ICD-10-CM | POA: Insufficient documentation

## 2021-09-22 DIAGNOSIS — I480 Paroxysmal atrial fibrillation: Secondary | ICD-10-CM | POA: Diagnosis present

## 2021-09-22 DIAGNOSIS — C8290 Follicular lymphoma, unspecified, unspecified site: Secondary | ICD-10-CM | POA: Insufficient documentation

## 2021-09-22 DIAGNOSIS — I130 Hypertensive heart and chronic kidney disease with heart failure and stage 1 through stage 4 chronic kidney disease, or unspecified chronic kidney disease: Secondary | ICD-10-CM | POA: Insufficient documentation

## 2021-09-22 DIAGNOSIS — Z7901 Long term (current) use of anticoagulants: Secondary | ICD-10-CM | POA: Insufficient documentation

## 2021-09-22 DIAGNOSIS — I5022 Chronic systolic (congestive) heart failure: Secondary | ICD-10-CM | POA: Diagnosis not present

## 2021-09-22 DIAGNOSIS — Z79899 Other long term (current) drug therapy: Secondary | ICD-10-CM | POA: Insufficient documentation

## 2021-09-22 DIAGNOSIS — D6869 Other thrombophilia: Secondary | ICD-10-CM

## 2021-09-22 NOTE — Progress Notes (Signed)
Primary Care Physician: [name] Primary Cardiologist: Dr Johney Frame  Primary Electrophysiologist: none Referring Physician: [facility]   Carrie [name] is a 80 y.o. female with a history of follicular lymphoma, HTN, CKD, atrial fibrillation who presents for consultation in the [facility].  The patient was initially diagnosed with atrial fibrillation 2016 in the setting of hypovolemia and electrolyte imbalances. She did well until 02/2019 when she had another episodes of afib with RVR and underwent DCCV. Echo showed EF 20-25% and she was started on amiodarone. It was unclear if the drop in EF was due to afib or her chemotherapy. Her EF normalized 3 months later. Patient is on Eliquis for a CHADS2VASC score of 6. She is no longer on amiodarone. She presented for an infusion at her oncologist but was found to be in rapid afib. She was sent to the ED and underwent DCCV. Of note, she had recently been diagnosed with the flu and is still coughing. She is in SR today but does have many days where she will feel very tired.   Today, she denies symptoms of palpitations, chest pain, shortness of breath, orthopnea, PND, lower extremity edema, dizziness, presyncope, syncope, snoring, daytime somnolence, bleeding, or neurologic sequela. The patient is tolerating medications without difficulties and is otherwise without complaint today.    Atrial Fibrillation Risk Factors:  she does not have symptoms or diagnosis of sleep apnea. she does not have a history of rheumatic fever. she does not have a history of alcohol use. The patient does not have a history of early familial atrial fibrillation or other arrhythmias.  she has a BMI of Body mass index is 26.34 kg/m.Marland Kitchen Filed Weights   09/22/21 1423  Weight: 63.2 kg    Family History  Problem Relation Age of Onset   Cancer Mother        Breast, lung NHL   Cancer Sister        Multiple myeloma     Atrial  Fibrillation Management history:  Previous antiarrhythmic drugs: flecainide, amiodarone  Previous cardioversions: 02/2019, 09/15/21 Previous ablations: none CHADS2VASC score: 6 Anticoagulation history: Eliquis   Past Medical History:  Diagnosis Date   A-fib ([place])    Anemia    pt denies    Atrial fibrillation with rapid ventricular response ([place]) 06/25/2015   Carpal tunnel syndrome, bilateral    pt denies    Cervical spondylosis without myelopathy 10/25/2013   Dental bridge present    DJD (degenerative joint disease)    Dyspnea    sometimes hx of cancer non hodgkins lymphoma    Dysrhythmia    WENT INTO A FIB IN 2017    Elevated troponin 05/22/9092   Follicular lymphoma grade 3a ([place]) 02/03/2015   hx of nonhodgkins lymphoma x 3    Headache    sometimes a headache    History of echocardiogram    Echo 12/16: EF 60-65%, no RWMA, severe LAE   History of nuclear stress test    Myoview 1/17: EF 55%, Normal study. No ischemia or scar.   Hypertension    Hypothyroid    Nausea without vomiting 06/20/2015   Stage III chronic kidney disease ([place]) 07/01/2015   Thrush of mouth and esophagus ([place]) 06/25/2015   Past Surgical History:  Procedure Laterality Date   ABDOMINAL HYSTERECTOMY     APPENDECTOMY     BACK SURGERY     X5-lumbar-fusion   COLONOSCOPY     DILATION AND CURETTAGE  OF UTERUS     LYMPH NODE BIOPSY Right 01/21/2015   Procedure: RIGHT GROIN LYMPH NODE BIOPSY;  Surgeon: Erroll Luna, MD;  Location: [facility];  Service: General;  Laterality: Right;   MASS EXCISION Left 08/29/2018   Procedure: EXCISION LEFT BACK  MASS;  Surgeon: Erroll Luna, MD;  Location: [facility];  Service: General;  Laterality: Left;   Ovarian cyst resection     patelar tendon transplants     Left/right   PORTACATH PLACEMENT Right 02/13/2015   Procedure: INSERTION PORT-A-CATH WITH ULTRASOUND;  Surgeon: Erroll Luna, MD;  Location: [facility];  Service: General;  Laterality: Right;   PORTACATH  PLACEMENT N/A 08/29/2018   Procedure: INSERTION PORT-A-CATH WITH ULTRA SOUND ERAS PATHWAY;  Surgeon: Erroll Luna, MD;  Location: [facility];  Service: General;  Laterality: N/A;   REVERSE SHOULDER ARTHROPLASTY Left 03/02/2021   Procedure: REVERSE SHOULDER ARTHROPLASTY;  Surgeon: Netta Cedars, MD;  Location: [facility];  Service: Orthopedics;  Laterality: Left;   TOTAL KNEE ARTHROPLASTY  2011   Right   TUBAL LIGATION      Current Outpatient Medications  Medication Sig Dispense Refill   acetaminophen (TYLENOL) 500 MG tablet Take 500 mg by mouth every 6 (six) hours as needed for moderate pain or headache.     amoxicillin (AMOXIL) 500 MG capsule TAKE FOUR CAPSULES BY MOUTH ONCE -TAKE 30-60 MINUTES BEFORE DENTAL APPOINTMENT     apixaban (ELIQUIS) 5 MG TABS tablet Take 5 mg by mouth 2 (two) times daily.      diclofenac sodium (VOLTAREN) 1 % GEL Apply 2 g topically 3 (three) times daily as needed (knee pain). 100 g 0   HYDROcodone bit-homatropine (HYCODAN) 5-1.5 MG/5ML syrup Take 5 mLs by mouth every 6 (six) hours as needed for cough. 240 mL 0   levothyroxine (SYNTHROID) 100 MCG tablet TAKE ONE TABLET BY MOUTH DAILY THYROID REPLACE CURRENT DOSAGE OF LEVOTHYROXINE WITH THIS LOWER DOSAGE.     lidocaine-prilocaine (EMLA) cream Apply 1 application topically daily as needed. 30 g 3   metoprolol succinate (TOPROL-XL) 25 MG 24 hr tablet Take 0.5 tablets (12.5 mg total) by mouth daily. Take with or immediately following a meal. 90 tablet 3   ondansetron (ZOFRAN) 8 MG tablet Take 1 tablet (8 mg total) by mouth every 8 (eight) hours as needed for refractory nausea / vomiting. Start on day 2 after bendamustine chemo. 30 tablet 1   oxyCODONE-acetaminophen (PERCOCET) 5-325 MG tablet Take 0.5-1 tablets by mouth every 6 (six) hours as needed for moderate pain or severe pain. 20 tablet 0   polyvinyl alcohol (LIQUIFILM TEARS) 1.4 % ophthalmic solution Place 1 drop into both eyes as needed for dry eyes.     No current  facility-administered medications for this encounter.   Facility-Administered Medications Ordered in Other Encounters  Medication Dose Route Frequency Provider Last Rate Last Admin   ondansetron (ZOFRAN) 8 mg in sodium chloride 0.9 % 50 mL IVPB   Intravenous Once Gorsuch, Ni, MD       sodium chloride 0.9 % injection 10 mL  10 mL Intravenous PRN Alvy Bimler, Ni, MD   10 mL at 04/05/17 1315   sodium chloride flush (NS) 0.9 % injection 10 mL  10 mL Intracatheter Once [name], MD        Allergies  Allergen Reactions   Amiodarone Other (See Comments)    Hyperthyroidism     Social History   Socioeconomic History   Marital status: Widowed  Spouse name: Not on file   Number of children: 2   Years of education: hs   Highest education level: Not on file  Occupational History   Occupation: Retired  Tobacco Use   Smoking status: Never   Smokeless tobacco: Never  Vaping Use   Vaping Use: Never used  Substance and Sexual Activity   Alcohol use: Not Currently   Drug use: No   Sexual activity: Not on file  Other Topics Concern   Not on file  Social History Narrative   Lives alone.  Has a walker for home use.   Social Determinants of Health   Financial Resource Strain: Not on file  Food Insecurity: Not on file  Transportation Needs: Not on file  Physical Activity: Not on file  Stress: Not on file  Social Connections: Not on file  Intimate Partner Violence: Not on file     ROS- All systems are reviewed and negative except as per the HPI above.  Physical Exam: Vitals:   09/22/21 1423  BP: (!) 144/90  Pulse: 60  Weight: 63.2 kg  Height: _0  (1.549 m)    GEN- The patient is a well appearing elderly female, alert and oriented x 3 today.   Head- normocephalic, atraumatic Eyes-  Sclera clear, conjunctiva pink Ears- hearing intact Oropharynx- clear Neck- supple  Lungs- Clear to ausculation bilaterally, normal work of breathing Heart- Regular rate and rhythm, no  murmurs, rubs or gallops  GI- soft, NT, ND, + BS Extremities- no clubbing, cyanosis, or edema MS- no significant deformity or atrophy Skin- no rash or lesion Psych- euthymic mood, full affect Neuro- strength and sensation are intact  Wt Readings from Last 3 Encounters:  09/22/21 63.2 kg  09/15/21 63.1 kg  07/10/21 63.6 kg    EKG today demonstrates  SR, NST Vent. rate 60 BPM PR interval 172 ms QRS duration 80 ms QT/QTcB 400/400 ms  Echo 10/31/20 demonstrated   1. Left ventricular ejection fraction, by estimation, is 55 to 60%. The  left ventricle has normal function. The left ventricle has no regional  wall motion abnormalities. Left ventricular diastolic parameters are  consistent with Grade I diastolic dysfunction (impaired relaxation). Elevated left ventricular end-diastolic pressure. The average left ventricular global longitudinal strain is -17.9 %. The global longitudinal strain is normal.   2. Right ventricular systolic function is normal. The right ventricular  size is normal. There is normal pulmonary artery systolic pressure.   3. Left atrial size was moderately dilated.   4. The mitral valve is normal in structure. Mild to moderate mitral valve regurgitation. No evidence of mitral stenosis.   5. The aortic valve is tricuspid. There is mild thickening of the aortic  valve. Aortic valve regurgitation is mild. No aortic stenosis is present.   6. The inferior vena cava is normal in size with greater than 50%  respiratory variability, suggesting right atrial pressure of 3 mmHg.   Comparison(s): No significant change from prior study. 05/24/19 EF 55-60%.   Epic records are reviewed at length today  CHA2DS2-VASc Score = 6  The patient's score is based upon: CHF History: 1 HTN History: 1 Diabetes History: 0 Stroke History: 0 Vascular Disease History: 1 Age Score: 2 Gender Score: 1       ASSESSMENT AND PLAN: 1. Paroxysmal Atrial Fibrillation (ICD10:  I48.0) The  patient's CHA2DS2-VASc score is 6, indicating a 9.7% annual risk of stroke.   S/p DCCV 09/15/21 Patient in [place] today.  We discussed having  her wear a heart monitor to see if afib could be contributing to her fatigue at home. She would like to defer for now and see how she feels after recovering from the flu. Continue Eliquis 5 mg BID Continue Toprol 12.5 mg daily  2. Secondary Hypercoagulable State (ICD10:  D68.69) The patient is at significant risk for stroke/thromboembolism based upon her CHA2DS2-VASc Score of 6.  Continue Apixaban (Eliquis).   3. Systolic dysfunction EF recovered, 55-60% No signs or symptoms of fluid overload today.  4. HTN Stable, no changes today.   Follow up in the AF clinic in 6 weeks.    [facility] [address] [place], [place] [address] [phone] 09/22/2021 2:45 PM

## 2021-09-30 ENCOUNTER — Telehealth: Payer: Self-pay | Admitting: Hematology and Oncology

## 2021-09-30 ENCOUNTER — Telehealth: Payer: Self-pay

## 2021-09-30 NOTE — Telephone Encounter (Signed)
Called back. Appts canceled and scheduling message sent. Ask Jessenya  to call the office back if needed.

## 2021-09-30 NOTE — Telephone Encounter (Signed)
She called and left a message. She wants to cancel 12/16 appts due to still having a cold/cough.  When should I attempt to reschedule appts? I will send a scheduling message.

## 2021-09-30 NOTE — Telephone Encounter (Signed)
Let's give her 1 month off I am free on 1/10 Can you send LOS and reschedule?

## 2021-09-30 NOTE — Telephone Encounter (Signed)
Scheduled per sch msg. Called and spoke with patient. Confirmed appt  

## 2021-10-02 ENCOUNTER — Ambulatory Visit: Payer: No Typology Code available for payment source | Admitting: Hematology and Oncology

## 2021-10-02 ENCOUNTER — Ambulatory Visit: Payer: No Typology Code available for payment source

## 2021-10-27 ENCOUNTER — Inpatient Hospital Stay (HOSPITAL_BASED_OUTPATIENT_CLINIC_OR_DEPARTMENT_OTHER): Payer: Medicare HMO | Admitting: Hematology and Oncology

## 2021-10-27 ENCOUNTER — Inpatient Hospital Stay: Payer: Medicare HMO | Attending: Hematology and Oncology

## 2021-10-27 ENCOUNTER — Inpatient Hospital Stay: Payer: Medicare HMO

## 2021-10-27 ENCOUNTER — Encounter: Payer: Self-pay | Admitting: Hematology and Oncology

## 2021-10-27 ENCOUNTER — Other Ambulatory Visit: Payer: Self-pay

## 2021-10-27 DIAGNOSIS — C8238 Follicular lymphoma grade IIIa, lymph nodes of multiple sites: Secondary | ICD-10-CM | POA: Insufficient documentation

## 2021-10-27 DIAGNOSIS — N1831 Chronic kidney disease, stage 3a: Secondary | ICD-10-CM

## 2021-10-27 DIAGNOSIS — Z5112 Encounter for antineoplastic immunotherapy: Secondary | ICD-10-CM | POA: Insufficient documentation

## 2021-10-27 DIAGNOSIS — H66001 Acute suppurative otitis media without spontaneous rupture of ear drum, right ear: Secondary | ICD-10-CM | POA: Diagnosis not present

## 2021-10-27 DIAGNOSIS — D61818 Other pancytopenia: Secondary | ICD-10-CM | POA: Insufficient documentation

## 2021-10-27 DIAGNOSIS — N183 Chronic kidney disease, stage 3 unspecified: Secondary | ICD-10-CM | POA: Diagnosis not present

## 2021-10-27 DIAGNOSIS — H6691 Otitis media, unspecified, right ear: Secondary | ICD-10-CM | POA: Diagnosis not present

## 2021-10-27 DIAGNOSIS — Z7189 Other specified counseling: Secondary | ICD-10-CM

## 2021-10-27 DIAGNOSIS — H6690 Otitis media, unspecified, unspecified ear: Secondary | ICD-10-CM | POA: Insufficient documentation

## 2021-10-27 LAB — CBC WITH DIFFERENTIAL (CANCER CENTER ONLY)
Abs Immature Granulocytes: 0.15 10*3/uL — ABNORMAL HIGH (ref 0.00–0.07)
Basophils Absolute: 0 10*3/uL (ref 0.0–0.1)
Basophils Relative: 1 %
Eosinophils Absolute: 0.2 10*3/uL (ref 0.0–0.5)
Eosinophils Relative: 12 %
HCT: 37.3 % (ref 36.0–46.0)
Hemoglobin: 11.9 g/dL — ABNORMAL LOW (ref 12.0–15.0)
Immature Granulocytes: 9 %
Lymphocytes Relative: 17 %
Lymphs Abs: 0.3 10*3/uL — ABNORMAL LOW (ref 0.7–4.0)
MCH: 29.8 pg (ref 26.0–34.0)
MCHC: 31.9 g/dL (ref 30.0–36.0)
MCV: 93.5 fL (ref 80.0–100.0)
Monocytes Absolute: 0.3 10*3/uL (ref 0.1–1.0)
Monocytes Relative: 20 %
Neutro Abs: 0.7 10*3/uL — ABNORMAL LOW (ref 1.7–7.7)
Neutrophils Relative %: 41 %
Platelet Count: 168 10*3/uL (ref 150–400)
RBC: 3.99 MIL/uL (ref 3.87–5.11)
RDW: 13.2 % (ref 11.5–15.5)
Smear Review: NORMAL
WBC Count: 1.6 10*3/uL — ABNORMAL LOW (ref 4.0–10.5)
WBC Morphology: INCREASED
nRBC: 0 % (ref 0.0–0.2)

## 2021-10-27 LAB — CMP (CANCER CENTER ONLY)
ALT: 10 U/L (ref 0–44)
AST: 14 U/L — ABNORMAL LOW (ref 15–41)
Albumin: 3.9 g/dL (ref 3.5–5.0)
Alkaline Phosphatase: 73 U/L (ref 38–126)
Anion gap: 7 (ref 5–15)
BUN: 24 mg/dL — ABNORMAL HIGH (ref 8–23)
CO2: 29 mmol/L (ref 22–32)
Calcium: 8.7 mg/dL — ABNORMAL LOW (ref 8.9–10.3)
Chloride: 104 mmol/L (ref 98–111)
Creatinine: 1.32 mg/dL — ABNORMAL HIGH (ref 0.44–1.00)
GFR, Estimated: 41 mL/min — ABNORMAL LOW (ref >60)
Glucose, Bld: 124 mg/dL — ABNORMAL HIGH (ref 70–99)
Potassium: 3.8 mmol/L (ref 3.5–5.1)
Sodium: 140 mmol/L (ref 135–145)
Total Bilirubin: 0.6 mg/dL (ref 0.3–1.2)
Total Protein: 6 g/dL — ABNORMAL LOW (ref 6.5–8.1)

## 2021-10-27 MED ORDER — AZITHROMYCIN 500 MG PO TABS: 500.0000 mg | ORAL_TABLET | Freq: Every day | ORAL | 0 refills | Status: DC

## 2021-10-27 MED ORDER — SODIUM CHLORIDE 0.9% FLUSH
10.0000 mL | Freq: Once | INTRAVENOUS | Status: AC
  Administered 2021-10-27: 10 mL

## 2021-10-27 NOTE — Assessment & Plan Note (Signed)
The patient is unstable to proceed with treatment We will reschedule her treatment and delay a minimum of 2 weeks to allow recovery

## 2021-10-27 NOTE — Assessment & Plan Note (Signed)
She has signs of acute otitis media I recommend a course of antibiotics We will delay treatment for 2 weeks

## 2021-10-27 NOTE — Assessment & Plan Note (Signed)
She has severe pancytopenia likely due to recent infection She is not safe to proceed with treatment I recommend canceling her treatment today and delay for 2 weeks

## 2021-10-27 NOTE — Assessment & Plan Note (Signed)
She has borderline renal dysfunction  We discussed the importance of hydration

## 2021-10-27 NOTE — Progress Notes (Signed)
[place] OFFICE PROGRESS NOTE  Patient Care Team: [facility], [name] as PCP - General [name], MD as PCP - Cardiology (Cardiology) [name], MD as Attending Physician (Internal Medicine)  ASSESSMENT & PLAN:  Grade 3a follicular lymphoma of lymph nodes of multiple regions [facility]) The patient is unstable to proceed with treatment We will reschedule her treatment and delay a minimum of 2 weeks to allow recovery  Pancytopenia, acquired [facility]) She has severe pancytopenia likely due to recent infection She is not safe to proceed with treatment I recommend canceling her treatment today and delay for 2 weeks  Acute otitis media She has signs of acute otitis media I recommend a course of antibiotics We will delay treatment for 2 weeks  Chronic kidney disease, stage III (moderate) ([place]) She has borderline renal dysfunction  We discussed the importance of hydration  No orders of the defined types were placed in this encounter.   All questions were answered. The patient knows to call the clinic with any problems, questions or concerns. The total time spent in the appointment was 30 minutes encounter with patients including review of chart and various tests results, discussions about plan of care and coordination of care plan   [name], MD 10/27/2021 3:27 PM  INTERVAL HISTORY: Please see below for problem oriented charting. she returns for treatment follow-up for maintenance rituximab for follicular lymphoma She is here accompanied by her husband She has not been feeling well She was ill approximately a month ago and was placed on steroids and antibiotics She complain of right-sided ear pain No recent fever, chills or sore throat No new lymphadenopathy  REVIEW OF SYSTEMS:   Constitutional: Denies fevers, chills or abnormal weight loss Eyes: Denies blurriness of vision Ears, nose, mouth, throat, and face: Denies mucositis or sore  throat Respiratory: Denies cough, dyspnea or wheezes Cardiovascular: Denies palpitation, chest discomfort or lower extremity swelling Gastrointestinal:  Denies nausea, heartburn or change in bowel habits Skin: Denies abnormal skin rashes Lymphatics: Denies new lymphadenopathy or easy bruising Neurological:Denies numbness, tingling or new weaknesses Behavioral/Psych: Mood is stable, no new changes  All other systems were reviewed with the patient and are negative.  I have reviewed the past medical history, past surgical history, social history and family history with the patient and they are unchanged from previous note.  ALLERGIES:  is allergic to amiodarone.  MEDICATIONS:  Current Outpatient Medications  Medication Sig Dispense Refill   azithromycin (ZITHROMAX) 500 MG tablet Take 1 tablet (500 mg total) by mouth daily. 3 tablet 0   acetaminophen (TYLENOL) 500 MG tablet Take 500 mg by mouth every 6 (six) hours as needed for moderate pain or headache.     amoxicillin (AMOXIL) 500 MG capsule TAKE FOUR CAPSULES BY MOUTH ONCE -TAKE 30-60 MINUTES BEFORE DENTAL APPOINTMENT     apixaban (ELIQUIS) 5 MG TABS tablet Take 5 mg by mouth 2 (two) times daily.      diclofenac sodium (VOLTAREN) 1 % GEL Apply 2 g topically 3 (three) times daily as needed (knee pain). 100 g 0   HYDROcodone bit-homatropine (HYCODAN) 5-1.5 MG/5ML syrup Take 5 mLs by mouth every 6 (six) hours as needed for cough. 240 mL 0   levothyroxine (SYNTHROID) 100 MCG tablet TAKE ONE TABLET BY MOUTH DAILY THYROID REPLACE CURRENT DOSAGE OF LEVOTHYROXINE WITH THIS LOWER DOSAGE.     lidocaine-prilocaine (EMLA) cream Apply 1 application topically daily as needed. 30 g 3   metoprolol succinate (TOPROL-XL) 25 MG  24 hr tablet Take 0.5 tablets (12.5 mg total) by mouth daily. Take with or immediately following a meal. 90 tablet 3   ondansetron (ZOFRAN) 8 MG tablet Take 1 tablet (8 mg total) by mouth every 8 (eight) hours as needed for refractory  nausea / vomiting. Start on day 2 after bendamustine chemo. 30 tablet 1   oxyCODONE-acetaminophen (PERCOCET) 5-325 MG tablet Take 0.5-1 tablets by mouth every 6 (six) hours as needed for moderate pain or severe pain. 20 tablet 0   polyvinyl alcohol (LIQUIFILM TEARS) 1.4 % ophthalmic solution Place 1 drop into both eyes as needed for dry eyes.     No current facility-administered medications for this visit.   Facility-Administered Medications Ordered in Other Visits  Medication Dose Route Frequency Provider Last Rate Last Admin   ondansetron (ZOFRAN) 8 mg in sodium chloride 0.9 % 50 mL IVPB   Intravenous Once Caterin Tabares, MD       sodium chloride 0.9 % injection 10 mL  10 mL Intravenous PRN Alvy Bimler, Mirjana Tarleton, MD   10 mL at 04/05/17 1315   sodium chloride flush (NS) 0.9 % injection 10 mL  10 mL Intracatheter Once [name], MD        SUMMARY OF ONCOLOGIC HISTORY: Oncology History  Grade 3a follicular lymphoma of lymph nodes of multiple regions ([place])  01/21/2015 Surgery   She underwent excisional lymph node biopsy that came back follicular lymphoma grade 3   01/21/2015 Pathology Results   Accession: [id number] biopsy confirmed follicular lymphoma   0/25/8527 Imaging   Echocardiogram showed ejection fraction of 55-60%   02/11/2015 Imaging    PET CT scan show possible splenic involvement and diffuse lymphadenopathy throughout   02/11/2015 Bone Marrow Biopsy    bone marrow biopsy was performed and is involved by lymphoma with translocation of igH/BCL2   02/13/2015 Procedure   She had port placement.   02/17/2015 - 06/02/2015 Chemotherapy   She received R-CHOP chemo x 6   02/17/2015 Adverse Reaction   She had mild infusion reaction with cycle 1 of treatment.   04/18/2015 Imaging   PET CT scan showed near complete response to treatment.   04/22/2015 Adverse Reaction   Vincristine dose was reduced by 50% due to neuropathy from cycle 4 onwards   06/25/2015 - 07/06/2015 Hospital Admission   She was  hospitalized for recent sepsis/bacteremia and a fib with RVR   07/11/2015 Imaging   repeat PEt scan showed complete response to Rx   07/14/2015 - 06/16/2017 Chemotherapy   She received maintenance Rituximab every 60 days   12/15/2015 Imaging   PET CT showed no evidence of cancer recurrence   06/15/2016 PET scan   No evidence of active lymphoma on skullbase to thigh FDG PET scan. Small LEFT periaortic retroperitoneal lymph nodes without significant metabolic activity ( Deauville 1). No change from prior   03/24/2017 Imaging   1. Borderline enlarged abdominal retroperitoneal lymph nodes, stable. No new adenopathy in the chest, abdomen or pelvis. 2.  Aortic atherosclerosis (ICD10-170.0). 3. Enlarged pulmonary arteries, indicative of pulmonary arterial hypertension.   09/14/2017 PET scan   Stable exam. No evidence of active lymphoma or other acute findings. (Deauville score 1)   08/08/2018 PET scan   1. Evidence of progressive/recurrent disease, as evidenced by new hypermetabolic nodes and subcutaneous nodule/nodes throughout left chest and abdomen. (Deauville 5). 2. Likely physiologic right nasopharyngeal hypermetabolism. Recommend attention on follow-up. 3. Incidental findings, including pulmonary artery enlargement, suggesting pulmonary arterial hypertension, stable left lower lobe  pulmonary nodule, and aortic Atherosclerosis (ICD10-I70.0).   08/29/2018 Initial Biopsy   Soft tissue simple excision left back: Follicular lymphoma grade 1-2 positive for CD20, PAX5, bcl-6, and bcl-2.  Ki-67 30%   09/11/2018 - 02/20/2019 Chemotherapy   The patient had Bendamustine and Rituximab   12/13/2018 Imaging   1. Response to therapy, as evidenced by resolution of left chest wall nodularity and decrease in size of small left axillary nodes. 2. No residual soft tissue thickening at the site of hypermetabolism along the posterior left eleventh rib. Resolution of adjacent subcutaneous nodularity. 3. No new  or progressive disease identified. 4.  Aortic Atherosclerosis (ICD10-I70.0). 5. Bilateral pulmonary nodules, similar. 6. Trace air within the urinary bladder could be iatrogenic. Possible pericystic edema. Correlate with symptoms of cystitis and recent instrumentation.   07/30/2019 Imaging   Ct chest, abdomen and pelvis No findings suspicious for active lymphoma in the chest, abdomen, or pelvis.   Small bilateral pulmonary nodules, unchanged, benign.   Again noted is trace nondependent gas within the bladder. Correlate for recent intervention.   07/09/2020 Imaging   MRI imaging performed [facility] on the left shoulder reviewed significant lymphadenopathy, worrisome for recurrent lymphoma   07/23/2020 Imaging   1. Recurrent lymphoma as evidenced by enlarging left pectoral and abdominal retroperitoneal lymph nodes. Small left supraclavicular lymph nodes are new. 2. Avascular necrosis in the right femoral head with secondary degenerative osteoarthritis in the right hip. 3.  Aortic atherosclerosis (ICD10-I70.0). 4. Enlarged pulmonic trunk, indicative of pulmonary arterial hypertension.     08/04/2020 -  Chemotherapy   The patient had Bendamustine and Rituxan   10/24/2020 Imaging   1. Resolution of left supraclavicular and left axillary adenopathy. No residual measurable disease suggesting an excellent response to treatment. 2. Significant interval improvement in retroperitoneal lymph nodes. No new or progressive findings. 3. Stable mild emphysematous changes. 4. Enlarged pulmonary arteries suggesting pulmonary hypertension. 5. Stable small low-attenuation splenic lesion, likely benign cyst. 6. Emphysema and aortic atherosclerosis.   02/02/2021 Imaging   1. Stable examination with unchanged small retroperitoneal lymph nodes. No new or enlarging pathologically enlarged lymph nodes in the chest, abdomen or pelvis. 2. Stable enlargement of the central pulmonary arteries, which can be seen with  pulmonary arterial hypertension. 3. Emphysema and aortic atherosclerosis.     05/11/2021 -  Chemotherapy   Patient is on Treatment Plan : NON-HODGKINS LYMPHOMA Rituximab q60d Maintenance     07/09/2021 Imaging   No new or enlarging lymph nodes seen in the chest, abdomen or pelvis.   Stable enlarged main pulmonary artery which can be seen in the setting of pulmonary hypertension.   Aortic Atherosclerosis (ICD10-I70.0) and Emphysema (ICD10-J43.9).     PHYSICAL EXAMINATION: ECOG PERFORMANCE STATUS: 1 - Symptomatic but completely ambulatory  Vitals:   10/27/21 1042  BP: (!) 141/75  Pulse: 81  Resp: 18  Temp: 97.7 F (36.5 C)  SpO2: 97%   Filed Weights   10/27/21 1042  Weight: 138 lb 3.2 oz (62.7 kg)    GENERAL:alert, no distress and comfortable SKIN: skin color, texture, turgor are normal, no rashes or significant lesions EYES: normal, Conjunctiva are pink and non-injected, sclera clear OROPHARYNX:no exudate, no erythema and lips, buccal mucosa, and tongue normal.  Noted signs of acute otitis media on the right side NECK: supple, thyroid normal size, non-tender, without nodularity LYMPH:  no palpable lymphadenopathy in the cervical, axillary or inguinal LUNGS: clear to auscultation and percussion with normal breathing effort HEART: regular rate &  rhythm and no murmurs and no lower extremity edema ABDOMEN:abdomen soft, non-tender and normal bowel sounds Musculoskeletal:no cyanosis of digits and no clubbing  NEURO: alert & oriented x 3 with fluent speech, no focal motor/sensory deficits  LABORATORY DATA:  I have reviewed the data as listed    Component Value Date/Time   NA 140 10/27/2021 1010   NA 143 07/16/2019 1132   NA 142 08/29/2017 0838   K 3.8 10/27/2021 1010   K 4.2 08/29/2017 0838   CL 104 10/27/2021 1010   CO2 29 10/27/2021 1010   CO2 23 08/29/2017 0838   GLUCOSE 124 (H) 10/27/2021 1010   GLUCOSE 97 08/29/2017 0838   BUN 24 (H) 10/27/2021 1010   BUN 27  07/16/2019 1132   BUN 24.0 08/29/2017 0838   CREATININE 1.32 (H) 10/27/2021 1010   CREATININE 1.0 08/29/2017 0838   CALCIUM 8.7 (L) 10/27/2021 1010   CALCIUM 8.9 08/29/2017 0838   PROT 6.0 (L) 10/27/2021 1010   PROT 6.4 08/29/2017 0838   ALBUMIN 3.9 10/27/2021 1010   ALBUMIN 3.8 08/29/2017 0838   AST 14 (L) 10/27/2021 1010   AST 18 08/29/2017 0838   ALT 10 10/27/2021 1010   ALT 16 08/29/2017 0838   ALKPHOS 73 10/27/2021 1010   ALKPHOS 93 08/29/2017 0838   BILITOT 0.6 10/27/2021 1010   BILITOT 0.33 08/29/2017 0838   GFRNONAA 41 (L) 10/27/2021 1010   GFRAA 58 (L) 05/26/2020 1103   GFRAA 39 (L) 07/30/2019 1010    No results found for: SPEP, UPEP  Lab Results  Component Value Date   WBC 1.6 (L) 10/27/2021   NEUTROABS 0.7 (L) 10/27/2021   HGB 11.9 (L) 10/27/2021   HCT 37.3 10/27/2021   MCV 93.5 10/27/2021   PLT 168 10/27/2021      Chemistry      Component Value Date/Time   NA 140 10/27/2021 1010   NA 143 07/16/2019 1132   NA 142 08/29/2017 0838   K 3.8 10/27/2021 1010   K 4.2 08/29/2017 0838   CL 104 10/27/2021 1010   CO2 29 10/27/2021 1010   CO2 23 08/29/2017 0838   BUN 24 (H) 10/27/2021 1010   BUN 27 07/16/2019 1132   BUN 24.0 08/29/2017 0838   CREATININE 1.32 (H) 10/27/2021 1010   CREATININE 1.0 08/29/2017 0838      Component Value Date/Time   CALCIUM 8.7 (L) 10/27/2021 1010   CALCIUM 8.9 08/29/2017 0838   ALKPHOS 73 10/27/2021 1010   ALKPHOS 93 08/29/2017 0838   AST 14 (L) 10/27/2021 1010   AST 18 08/29/2017 0838   ALT 10 10/27/2021 1010   ALT 16 08/29/2017 0838   BILITOT 0.6 10/27/2021 1010   BILITOT 0.33 08/29/2017 0600

## 2021-11-02 ENCOUNTER — Telehealth: Payer: Self-pay

## 2021-11-02 NOTE — Telephone Encounter (Signed)
Called and given below message. She is feeling a lot better and just has a cough at times. She appreciated the call and will keep scheduled appt on 1/23.

## 2021-11-02 NOTE — Telephone Encounter (Signed)
-----   Message from [name], MD sent at 11/02/2021  8:57 AM EST ----- Can you call if she feels better?

## 2021-11-03 ENCOUNTER — Ambulatory Visit (HOSPITAL_COMMUNITY): Payer: No Typology Code available for payment source | Admitting: Physician Assistant

## 2021-11-06 ENCOUNTER — Encounter: Payer: Self-pay | Admitting: Hematology and Oncology

## 2021-11-09 ENCOUNTER — Inpatient Hospital Stay: Payer: Medicare HMO

## 2021-11-09 ENCOUNTER — Other Ambulatory Visit: Payer: Self-pay

## 2021-11-09 ENCOUNTER — Encounter: Payer: Self-pay | Admitting: Hematology and Oncology

## 2021-11-09 ENCOUNTER — Inpatient Hospital Stay (HOSPITAL_BASED_OUTPATIENT_CLINIC_OR_DEPARTMENT_OTHER): Payer: Medicare HMO | Admitting: Hematology and Oncology

## 2021-11-09 VITALS — BP 166/93 | HR 84 | Temp 98.4°F | Resp 17

## 2021-11-09 DIAGNOSIS — N1831 Chronic kidney disease, stage 3a: Secondary | ICD-10-CM

## 2021-11-09 DIAGNOSIS — C8238 Follicular lymphoma grade IIIa, lymph nodes of multiple sites: Secondary | ICD-10-CM

## 2021-11-09 DIAGNOSIS — D61818 Other pancytopenia: Secondary | ICD-10-CM | POA: Diagnosis not present

## 2021-11-09 DIAGNOSIS — Z7189 Other specified counseling: Secondary | ICD-10-CM

## 2021-11-09 DIAGNOSIS — Z5112 Encounter for antineoplastic immunotherapy: Secondary | ICD-10-CM | POA: Diagnosis not present

## 2021-11-09 LAB — CBC WITH DIFFERENTIAL (CANCER CENTER ONLY)
Abs Immature Granulocytes: 0.11 10*3/uL — ABNORMAL HIGH (ref 0.00–0.07)
Basophils Absolute: 0 10*3/uL (ref 0.0–0.1)
Basophils Relative: 1 %
Eosinophils Absolute: 0.2 10*3/uL (ref 0.0–0.5)
Eosinophils Relative: 6 %
HCT: 37.1 % (ref 36.0–46.0)
Hemoglobin: 12.5 g/dL (ref 12.0–15.0)
Immature Granulocytes: 4 %
Lymphocytes Relative: 12 %
Lymphs Abs: 0.3 10*3/uL — ABNORMAL LOW (ref 0.7–4.0)
MCH: 30.9 pg (ref 26.0–34.0)
MCHC: 33.7 g/dL (ref 30.0–36.0)
MCV: 91.6 fL (ref 80.0–100.0)
Monocytes Absolute: 0.4 10*3/uL (ref 0.1–1.0)
Monocytes Relative: 16 %
Neutro Abs: 1.6 10*3/uL — ABNORMAL LOW (ref 1.7–7.7)
Neutrophils Relative %: 61 %
Platelet Count: 161 10*3/uL (ref 150–400)
RBC: 4.05 MIL/uL (ref 3.87–5.11)
RDW: 13.7 % (ref 11.5–15.5)
WBC Count: 2.7 10*3/uL — ABNORMAL LOW (ref 4.0–10.5)
nRBC: 0 % (ref 0.0–0.2)

## 2021-11-09 LAB — CMP (CANCER CENTER ONLY)
ALT: 9 U/L (ref 0–44)
AST: 15 U/L (ref 15–41)
Albumin: 4.1 g/dL (ref 3.5–5.0)
Alkaline Phosphatase: 84 U/L (ref 38–126)
Anion gap: 8 (ref 5–15)
BUN: 28 mg/dL — ABNORMAL HIGH (ref 8–23)
CO2: 27 mmol/L (ref 22–32)
Calcium: 9 mg/dL (ref 8.9–10.3)
Chloride: 106 mmol/L (ref 98–111)
Creatinine: 1.22 mg/dL — ABNORMAL HIGH (ref 0.44–1.00)
GFR, Estimated: 45 mL/min — ABNORMAL LOW (ref >60)
Glucose, Bld: 115 mg/dL — ABNORMAL HIGH (ref 70–99)
Potassium: 3.9 mmol/L (ref 3.5–5.1)
Sodium: 141 mmol/L (ref 135–145)
Total Bilirubin: 0.7 mg/dL (ref 0.3–1.2)
Total Protein: 6.1 g/dL — ABNORMAL LOW (ref 6.5–8.1)

## 2021-11-09 MED ORDER — SODIUM CHLORIDE 0.9 % IV SOLN: Freq: Once | INTRAVENOUS | Status: AC

## 2021-11-09 MED ORDER — HEPARIN SOD (PORK) LOCK FLUSH 100 UNIT/ML IV SOLN
500.0000 [IU] | Freq: Once | INTRAVENOUS | Status: AC | PRN
  Administered 2021-11-09: 500 [IU]

## 2021-11-09 MED ORDER — SODIUM CHLORIDE 0.9 % IV SOLN
375.0000 mg/m2 | Freq: Once | INTRAVENOUS | Status: AC
  Administered 2021-11-09: 600 mg via INTRAVENOUS
  Filled 2021-11-09: qty 10

## 2021-11-09 MED ORDER — DIPHENHYDRAMINE HCL 25 MG PO CAPS
25.0000 mg | ORAL_CAPSULE | Freq: Once | ORAL | Status: AC
  Administered 2021-11-09: 25 mg via ORAL
  Filled 2021-11-09: qty 1

## 2021-11-09 MED ORDER — ACETAMINOPHEN 325 MG PO TABS
650.0000 mg | ORAL_TABLET | Freq: Once | ORAL | Status: AC
  Administered 2021-11-09: 650 mg via ORAL
  Filled 2021-11-09: qty 2

## 2021-11-09 MED ORDER — SODIUM CHLORIDE 0.9% FLUSH
10.0000 mL | Freq: Once | INTRAVENOUS | Status: AC
  Administered 2021-11-09: 10 mL

## 2021-11-09 MED ORDER — SODIUM CHLORIDE 0.9% FLUSH
10.0000 mL | INTRAVENOUS | Status: DC | PRN
  Administered 2021-11-09: 10 mL

## 2021-11-09 NOTE — Assessment & Plan Note (Signed)
Her last CT imaging showed complete response She has no signs of residual disease or relapse She is fully recovered from recent infection and we will resume treatment today The plan will be to continue treatment every 60 days for 2 years

## 2021-11-09 NOTE — Progress Notes (Signed)
[facility] OFFICE PROGRESS NOTE  Patient Care Team: Clinic, [name] as PCP - General [name], MD as PCP - Cardiology (Cardiology) [name], MD as Attending Physician (Internal Medicine)  ASSESSMENT & PLAN:  Grade 3a follicular lymphoma of lymph nodes of multiple regions [facility]) Her last CT imaging showed complete response She has no signs of residual disease or relapse She is fully recovered from recent infection and we will resume treatment today The plan will be to continue treatment every 60 days for 2 years   Pancytopenia, acquired ([place]) She has history of intermittent leukopenia She has recovered from recent infection We will proceed with treatment without delay  Chronic kidney disease, stage III (moderate) ([place]) She has borderline renal dysfunction  We will monitor closely and continue risk factor modification  No orders of the defined types were placed in this encounter.   All questions were answered. The patient knows to call the clinic with any problems, questions or concerns. The total time spent in the appointment was 20 minutes encounter with patients including review of chart and various tests results, discussions about plan of care and coordination of care plan   [name], MD 11/09/2021 11:46 AM  INTERVAL HISTORY: Please see below for problem oriented charting. she returns for treatment follow-up on rituximab maintenance treatment for recurrent follicular lymphoma She is doing better since last time I saw her No further fever or chills She has slight residual cough but overall much improved  REVIEW OF SYSTEMS:   Constitutional: Denies fevers, chills or abnormal weight loss Eyes: Denies blurriness of vision Ears, nose, mouth, throat, and face: Denies mucositis or sore throat Respiratory: Denies cough, dyspnea or wheezes Cardiovascular: Denies palpitation, chest discomfort or lower extremity  swelling Gastrointestinal:  Denies nausea, heartburn or change in bowel habits Skin: Denies abnormal skin rashes Lymphatics: Denies new lymphadenopathy or easy bruising Neurological:Denies numbness, tingling or new weaknesses Behavioral/Psych: Mood is stable, no new changes  All other systems were reviewed with the patient and are negative.  I have reviewed the past medical history, past surgical history, social history and family history with the patient and they are unchanged from previous note.  ALLERGIES:  is allergic to amiodarone.  MEDICATIONS:  Current Outpatient Medications  Medication Sig Dispense Refill   acetaminophen (TYLENOL) 500 MG tablet Take 500 mg by mouth every 6 (six) hours as needed for moderate pain or headache.     amoxicillin (AMOXIL) 500 MG capsule TAKE FOUR CAPSULES BY MOUTH ONCE -TAKE 30-60 MINUTES BEFORE DENTAL APPOINTMENT     apixaban (ELIQUIS) 5 MG TABS tablet Take 5 mg by mouth 2 (two) times daily.      diclofenac sodium (VOLTAREN) 1 % GEL Apply 2 g topically 3 (three) times daily as needed (knee pain). 100 g 0   levothyroxine (SYNTHROID) 100 MCG tablet TAKE ONE TABLET BY MOUTH DAILY THYROID REPLACE CURRENT DOSAGE OF LEVOTHYROXINE WITH THIS LOWER DOSAGE.     lidocaine-prilocaine (EMLA) cream Apply 1 application topically daily as needed. 30 g 3   metoprolol succinate (TOPROL-XL) 25 MG 24 hr tablet Take 0.5 tablets (12.5 mg total) by mouth daily. Take with or immediately following a meal. 90 tablet 3   ondansetron (ZOFRAN) 8 MG tablet Take 1 tablet (8 mg total) by mouth every 8 (eight) hours as needed for refractory nausea / vomiting. Start on day 2 after bendamustine chemo. 30 tablet 1   oxyCODONE-acetaminophen (PERCOCET) 5-325 MG tablet Take 0.5-1 tablets by mouth  every 6 (six) hours as needed for moderate pain or severe pain. 20 tablet 0   polyvinyl alcohol (LIQUIFILM TEARS) 1.4 % ophthalmic solution Place 1 drop into both eyes as needed for dry eyes.     No  current facility-administered medications for this visit.   Facility-Administered Medications Ordered in Other Visits  Medication Dose Route Frequency Provider Last Rate Last Admin   ondansetron (ZOFRAN) 8 mg in sodium chloride 0.9 % 50 mL IVPB   Intravenous Once Reyansh Kushnir, MD       sodium chloride 0.9 % injection 10 mL  10 mL Intravenous PRN Alvy Bimler, Markian Glockner, MD   10 mL at 04/05/17 1315   sodium chloride flush (NS) 0.9 % injection 10 mL  10 mL Intracatheter Once [name], MD        SUMMARY OF ONCOLOGIC HISTORY: Oncology History  Grade 3a follicular lymphoma of lymph nodes of multiple regions ([place])  01/21/2015 Surgery   She underwent excisional lymph node biopsy that came back follicular lymphoma grade 3   01/21/2015 Pathology Results   Accession: [id number] biopsy confirmed follicular lymphoma   2/58/5277 Imaging   Echocardiogram showed ejection fraction of 55-60%   02/11/2015 Imaging    PET CT scan show possible splenic involvement and diffuse lymphadenopathy throughout   02/11/2015 Bone Marrow Biopsy    bone marrow biopsy was performed and is involved by lymphoma with translocation of igH/BCL2   02/13/2015 Procedure   She had port placement.   02/17/2015 - 06/02/2015 Chemotherapy   She received R-CHOP chemo x 6   02/17/2015 Adverse Reaction   She had mild infusion reaction with cycle 1 of treatment.   04/18/2015 Imaging   PET CT scan showed near complete response to treatment.   04/22/2015 Adverse Reaction   Vincristine dose was reduced by 50% due to neuropathy from cycle 4 onwards   06/25/2015 - 07/06/2015 Hospital Admission   She was hospitalized for recent sepsis/bacteremia and a fib with RVR   07/11/2015 Imaging   repeat PEt scan showed complete response to Rx   07/14/2015 - 06/16/2017 Chemotherapy   She received maintenance Rituximab every 60 days   12/15/2015 Imaging   PET CT showed no evidence of cancer recurrence   06/15/2016 PET scan   No evidence of active lymphoma on  skullbase to thigh FDG PET scan. Small LEFT periaortic retroperitoneal lymph nodes without significant metabolic activity ( [place]auville 1). No change from prior   03/24/2017 Imaging   1. Borderline enlarged abdominal retroperitoneal lymph nodes, stable. No new adenopathy in the chest, abdomen or pelvis. 2.  Aortic atherosclerosis (ICD10-170.0). 3. Enlarged pulmonary arteries, indicative of pulmonary arterial hypertension.   09/14/2017 PET scan   Stable exam. No evidence of active lymphoma or other acute findings. (Deauville score 1)   08/08/2018 PET scan   1. Evidence of progressive/recurrent disease, as evidenced by new hypermetabolic nodes and subcutaneous nodule/nodes throughout left chest and abdomen. (Deauville 5). 2. Likely physiologic right nasopharyngeal hypermetabolism. Recommend attention on follow-up. 3. Incidental findings, including pulmonary artery enlargement, suggesting pulmonary arterial hypertension, stable left lower lobe pulmonary nodule, and aortic Atherosclerosis (ICD10-I70.0).   08/29/2018 Initial Biopsy   Soft tissue simple excision left back: Follicular lymphoma grade 1-2 positive for CD20, PAX5, bcl-6, and bcl-2.  Ki-67 30%   09/11/2018 - 02/20/2019 Chemotherapy   The patient had Bendamustine and Rituximab   12/13/2018 Imaging   1. Response to therapy, as evidenced by resolution of left chest wall nodularity and decrease in size  of small left axillary nodes. 2. No residual soft tissue thickening at the site of hypermetabolism along the posterior left eleventh rib. Resolution of adjacent subcutaneous nodularity. 3. No new or progressive disease identified. 4.  Aortic Atherosclerosis (ICD10-I70.0). 5. Bilateral pulmonary nodules, similar. 6. Trace air within the urinary bladder could be iatrogenic. Possible pericystic edema. Correlate with symptoms of cystitis and recent instrumentation.   07/30/2019 Imaging   Ct chest, abdomen and pelvis No findings suspicious for  active lymphoma in the chest, abdomen, or pelvis.   Small bilateral pulmonary nodules, unchanged, benign.   Again noted is trace nondependent gas within the bladder. Correlate for recent intervention.   07/09/2020 Imaging   MRI imaging performed at the [facility] on the left shoulder reviewed significant lymphadenopathy, worrisome for recurrent lymphoma   07/23/2020 Imaging   1. Recurrent lymphoma as evidenced by enlarging left pectoral and abdominal retroperitoneal lymph nodes. Small left supraclavicular lymph nodes are new. 2. Avascular necrosis in the right femoral head with secondary degenerative osteoarthritis in the right hip. 3.  Aortic atherosclerosis (ICD10-I70.0). 4. Enlarged pulmonic trunk, indicative of pulmonary arterial hypertension.     08/04/2020 -  Chemotherapy   The patient had Bendamustine and Rituxan   10/24/2020 Imaging   1. Resolution of left supraclavicular and left axillary adenopathy. No residual measurable disease suggesting an excellent response to treatment. 2. Significant interval improvement in retroperitoneal lymph nodes. No new or progressive findings. 3. Stable mild emphysematous changes. 4. Enlarged pulmonary arteries suggesting pulmonary hypertension. 5. Stable small low-attenuation splenic lesion, likely benign cyst. 6. Emphysema and aortic atherosclerosis.   02/02/2021 Imaging   1. Stable examination with unchanged small retroperitoneal lymph nodes. No new or enlarging pathologically enlarged lymph nodes in the chest, abdomen or pelvis. 2. Stable enlargement of the central pulmonary arteries, which can be seen with pulmonary arterial hypertension. 3. Emphysema and aortic atherosclerosis.     05/11/2021 -  Chemotherapy   Patient is on Treatment Plan : NON-HODGKINS LYMPHOMA Rituximab q60d Maintenance     07/09/2021 Imaging   No new or enlarging lymph nodes seen in the chest, abdomen or pelvis.   Stable enlarged main pulmonary artery which can be seen in the  setting of pulmonary hypertension.   Aortic Atherosclerosis (ICD10-I70.0) and Emphysema (ICD10-J43.9).     PHYSICAL EXAMINATION: ECOG PERFORMANCE STATUS: 0 - Asymptomatic  Vitals:   11/09/21 1123  BP: (!) 154/80  Pulse: 72  Resp: 18  Temp: 98.1 F (36.7 C)  SpO2: 99%   Filed Weights   11/09/21 1123  Weight: 139 lb 9.6 oz (63.3 kg)    GENERAL:alert, no distress and comfortable SKIN: skin color, texture, turgor are normal, no rashes or significant lesions EYES: normal, Conjunctiva are pink and non-injected, sclera clear OROPHARYNX:no exudate, no erythema and lips, buccal mucosa, and tongue normal  NECK: supple, thyroid normal size, non-tender, without nodularity LYMPH:  no palpable lymphadenopathy in the cervical, axillary or inguinal LUNGS: clear to auscultation and percussion with normal breathing effort HEART: regular rate & rhythm and no murmurs and no lower extremity edema ABDOMEN:abdomen soft, non-tender and normal bowel sounds Musculoskeletal:no cyanosis of digits and no clubbing  NEURO: alert & oriented x 3 with fluent speech, no focal motor/sensory deficits  LABORATORY DATA:  I have reviewed the data as listed    Component Value Date/Time   NA 140 10/27/2021 1010   NA 143 07/16/2019 1132   NA 142 08/29/2017 0838   K 3.8 10/27/2021 1010   K 4.2 08/29/2017  0838   CL 104 10/27/2021 1010   CO2 29 10/27/2021 1010   CO2 23 08/29/2017 0838   GLUCOSE 124 (H) 10/27/2021 1010   GLUCOSE 97 08/29/2017 0838   BUN 24 (H) 10/27/2021 1010   BUN 27 07/16/2019 1132   BUN 24.0 08/29/2017 0838   CREATININE 1.32 (H) 10/27/2021 1010   CREATININE 1.0 08/29/2017 0838   CALCIUM 8.7 (L) 10/27/2021 1010   CALCIUM 8.9 08/29/2017 0838   PROT 6.0 (L) 10/27/2021 1010   PROT 6.4 08/29/2017 0838   ALBUMIN 3.9 10/27/2021 1010   ALBUMIN 3.8 08/29/2017 0838   AST 14 (L) 10/27/2021 1010   AST 18 08/29/2017 0838   ALT 10 10/27/2021 1010   ALT 16 08/29/2017 0838   ALKPHOS 73  10/27/2021 1010   ALKPHOS 93 08/29/2017 0838   BILITOT 0.6 10/27/2021 1010   BILITOT 0.33 08/29/2017 0838   GFRNONAA 41 (L) 10/27/2021 1010   GFRAA 58 (L) 05/26/2020 1103   GFRAA 39 (L) 07/30/2019 1010    No results found for: SPEP, UPEP  Lab Results  Component Value Date   WBC 2.7 (L) 11/09/2021   NEUTROABS 1.6 (L) 11/09/2021   HGB 12.5 11/09/2021   HCT 37.1 11/09/2021   MCV 91.6 11/09/2021   PLT 161 11/09/2021      Chemistry      Component Value Date/Time   NA 140 10/27/2021 1010   NA 143 07/16/2019 1132   NA 142 08/29/2017 0838   K 3.8 10/27/2021 1010   K 4.2 08/29/2017 0838   CL 104 10/27/2021 1010   CO2 29 10/27/2021 1010   CO2 23 08/29/2017 0838   BUN 24 (H) 10/27/2021 1010   BUN 27 07/16/2019 1132   BUN 24.0 08/29/2017 0838   CREATININE 1.32 (H) 10/27/2021 1010   CREATININE 1.0 08/29/2017 0838      Component Value Date/Time   CALCIUM 8.7 (L) 10/27/2021 1010   CALCIUM 8.9 08/29/2017 0838   ALKPHOS 73 10/27/2021 1010   ALKPHOS 93 08/29/2017 0838   AST 14 (L) 10/27/2021 1010   AST 18 08/29/2017 0838   ALT 10 10/27/2021 1010   ALT 16 08/29/2017 0838   BILITOT 0.6 10/27/2021 1010   BILITOT 0.33 08/29/2017 8250

## 2021-11-09 NOTE — Patient Instructions (Signed)
[facility] MEDICAL ONCOLOGY  Discharge Instructions: Thank you for choosing [facility] to provide your oncology and hematology care.   If you have a lab appointment with the Cancer Center, please go directly to the Cancer Center and check in at the registration area.   Wear comfortable clothing and clothing appropriate for easy access to any Portacath or PICC line.   We strive to give you quality time with your provider. You may need to reschedule your appointment if you arrive late (15 or more minutes).  Arriving late affects you and other patients whose appointments are after yours.  Also, if you miss three or more appointments without notifying the office, you may be dismissed from the clinic at the provider's discretion.      For prescription refill requests, have your pharmacy contact our office and allow 72 hours for refills to be completed.    Today you received the following chemotherapy and/or immunotherapy agents Rituximab      To help prevent nausea and vomiting after your treatment, we encourage you to take your nausea medication as directed.  BELOW ARE SYMPTOMS THAT SHOULD BE REPORTED IMMEDIATELY: *FEVER GREATER THAN 100.4 F (38 C) OR HIGHER *CHILLS OR SWEATING *NAUSEA AND VOMITING THAT IS NOT CONTROLLED WITH YOUR NAUSEA MEDICATION *UNUSUAL SHORTNESS OF BREATH *UNUSUAL BRUISING OR BLEEDING *URINARY PROBLEMS (pain or burning when urinating, or frequent urination) *BOWEL PROBLEMS (unusual diarrhea, constipation, pain near the anus) TENDERNESS IN MOUTH AND THROAT WITH OR WITHOUT PRESENCE OF ULCERS (sore throat, sores in mouth, or a toothache) UNUSUAL RASH, SWELLING OR PAIN  UNUSUAL VAGINAL DISCHARGE OR ITCHING   Items with * indicate a potential emergency and should be followed up as soon as possible or go to the Emergency Department if any problems should occur.  Please show the CHEMOTHERAPY ALERT CARD or IMMUNOTHERAPY ALERT CARD at check-in to  the Emergency Department and triage nurse.  Should you have questions after your visit or need to cancel or reschedule your appointment, please contact [facility]ICAL [facility]COL[facility]  Dept: [phone]  and follow the prompts.  Office hours are 8:00 a.m. to 4:30 p.m. Monday - Friday. Please note that voicemails left after 4:00 p.m. may not be returned until the following business day.  We are closed weekends and major holidays. You have access to a nurse at all times for urgent questions. Please call the main number to the clinic Dept: [phone] and follow the prompts.   For any non-urgent questions, you may also contact your provider using MyChart. We now offer e-Visits for anyone 18 and older to request care online for non-urgent symptoms. For details visit [email or web address].   Also download the MyChart app! Go to the app store, search "MyChart", open the app, select [facility], and log in with your MyChart username and password.  Due to Covid, a mask is required upon entering the hospital/clinic. If you do not have a mask, one will be given to you upon arrival. For doctor visits, patients may have 1 support person aged 18 or older with them. For treatment visits, patients cannot have anyone with them due to current Covid guidelines and our immunocompromised population.   

## 2021-11-09 NOTE — Assessment & Plan Note (Signed)
She has borderline renal dysfunction  We will monitor closely and continue risk factor modification

## 2021-11-09 NOTE — Assessment & Plan Note (Signed)
She has history of intermittent leukopenia She has recovered from recent infection We will proceed with treatment without delay

## 2021-12-30 ENCOUNTER — Ambulatory Visit
Admission: EM | Admit: 2021-12-30 | Discharge: 2021-12-30 | Disposition: A | Discharge status: Home / Self Care | Payer: Medicare HMO

## 2021-12-30 ENCOUNTER — Observation Stay (HOSPITAL_COMMUNITY)
Admission: EM | Admit: 2021-12-30 | Discharge: 2021-12-31 | Disposition: A | Discharge status: Home / Self Care | Payer: No Typology Code available for payment source | Attending: Internal Medicine | Admitting: Internal Medicine

## 2021-12-30 ENCOUNTER — Other Ambulatory Visit: Payer: Self-pay

## 2021-12-30 ENCOUNTER — Emergency Department (HOSPITAL_COMMUNITY): Payer: No Typology Code available for payment source

## 2021-12-30 ENCOUNTER — Encounter (HOSPITAL_COMMUNITY): Payer: Self-pay

## 2021-12-30 ENCOUNTER — Observation Stay (HOSPITAL_COMMUNITY): Payer: No Typology Code available for payment source

## 2021-12-30 DIAGNOSIS — I130 Hypertensive heart and chronic kidney disease with heart failure and stage 1 through stage 4 chronic kidney disease, or unspecified chronic kidney disease: Secondary | ICD-10-CM | POA: Diagnosis not present

## 2021-12-30 DIAGNOSIS — J9601 Acute respiratory failure with hypoxia: Secondary | ICD-10-CM

## 2021-12-30 DIAGNOSIS — I480 Paroxysmal atrial fibrillation: Secondary | ICD-10-CM | POA: Diagnosis present

## 2021-12-30 DIAGNOSIS — Z79899 Other long term (current) drug therapy: Secondary | ICD-10-CM | POA: Diagnosis not present

## 2021-12-30 DIAGNOSIS — R5383 Other fatigue: Secondary | ICD-10-CM

## 2021-12-30 DIAGNOSIS — Z79890 Hormone replacement therapy (postmenopausal): Secondary | ICD-10-CM | POA: Diagnosis not present

## 2021-12-30 DIAGNOSIS — R519 Headache, unspecified: Secondary | ICD-10-CM

## 2021-12-30 DIAGNOSIS — Z20822 Contact with and (suspected) exposure to covid-19: Secondary | ICD-10-CM | POA: Diagnosis not present

## 2021-12-30 DIAGNOSIS — R0602 Shortness of breath: Secondary | ICD-10-CM

## 2021-12-30 DIAGNOSIS — I272 Pulmonary hypertension, unspecified: Secondary | ICD-10-CM | POA: Diagnosis not present

## 2021-12-30 DIAGNOSIS — I5030 Unspecified diastolic (congestive) heart failure: Secondary | ICD-10-CM

## 2021-12-30 DIAGNOSIS — N1831 Chronic kidney disease, stage 3a: Secondary | ICD-10-CM | POA: Diagnosis present

## 2021-12-30 DIAGNOSIS — C8238 Follicular lymphoma grade IIIa, lymph nodes of multiple sites: Secondary | ICD-10-CM | POA: Diagnosis present

## 2021-12-30 DIAGNOSIS — J9621 Acute and chronic respiratory failure with hypoxia: Secondary | ICD-10-CM | POA: Diagnosis not present

## 2021-12-30 DIAGNOSIS — R0609 Other forms of dyspnea: Secondary | ICD-10-CM | POA: Diagnosis not present

## 2021-12-30 DIAGNOSIS — I5022 Chronic systolic (congestive) heart failure: Secondary | ICD-10-CM | POA: Insufficient documentation

## 2021-12-30 DIAGNOSIS — Z7901 Long term (current) use of anticoagulants: Secondary | ICD-10-CM | POA: Insufficient documentation

## 2021-12-30 DIAGNOSIS — I10 Essential (primary) hypertension: Secondary | ICD-10-CM | POA: Diagnosis present

## 2021-12-30 DIAGNOSIS — I5032 Chronic diastolic (congestive) heart failure: Secondary | ICD-10-CM

## 2021-12-30 DIAGNOSIS — Z96651 Presence of right artificial knee joint: Secondary | ICD-10-CM | POA: Insufficient documentation

## 2021-12-30 DIAGNOSIS — N179 Acute kidney failure, unspecified: Secondary | ICD-10-CM | POA: Diagnosis present

## 2021-12-30 DIAGNOSIS — U071 COVID-19: Secondary | ICD-10-CM | POA: Diagnosis not present

## 2021-12-30 DIAGNOSIS — D638 Anemia in other chronic diseases classified elsewhere: Secondary | ICD-10-CM | POA: Diagnosis present

## 2021-12-30 DIAGNOSIS — N183 Chronic kidney disease, stage 3 unspecified: Secondary | ICD-10-CM | POA: Insufficient documentation

## 2021-12-30 DIAGNOSIS — I482 Chronic atrial fibrillation, unspecified: Secondary | ICD-10-CM | POA: Diagnosis present

## 2021-12-30 DIAGNOSIS — K2100 Gastro-esophageal reflux disease with esophagitis, without bleeding: Secondary | ICD-10-CM | POA: Diagnosis present

## 2021-12-30 LAB — TSH: TSH: 0.343 u[IU]/mL — ABNORMAL LOW (ref 0.350–4.500)

## 2021-12-30 LAB — D-DIMER, QUANTITATIVE: D-Dimer, Quant: 0.72 ug/mL-FEU — ABNORMAL HIGH (ref 0.00–0.50)

## 2021-12-30 LAB — CBC
HCT: 41.9 % (ref 36.0–46.0)
Hemoglobin: 14.1 g/dL (ref 12.0–15.0)
MCH: 31 pg (ref 26.0–34.0)
MCHC: 33.7 g/dL (ref 30.0–36.0)
MCV: 92.1 fL (ref 80.0–100.0)
Platelets: 177 10*3/uL (ref 150–400)
RBC: 4.55 MIL/uL (ref 3.87–5.11)
RDW: 13.4 % (ref 11.5–15.5)
WBC: 5.4 10*3/uL (ref 4.0–10.5)
nRBC: 0 % (ref 0.0–0.2)

## 2021-12-30 LAB — TROPONIN I (HIGH SENSITIVITY)
Troponin I (High Sensitivity): 24 ng/L — ABNORMAL HIGH (ref <18)
Troponin I (High Sensitivity): 17 ng/L (ref <18)

## 2021-12-30 LAB — BASIC METABOLIC PANEL
Anion gap: 12 (ref 5–15)
BUN: 23 mg/dL (ref 8–23)
CO2: 26 mmol/L (ref 22–32)
Calcium: 9.1 mg/dL (ref 8.9–10.3)
Chloride: 100 mmol/L (ref 98–111)
Creatinine, Ser: 1.07 mg/dL — ABNORMAL HIGH (ref 0.44–1.00)
GFR, Estimated: 53 mL/min — ABNORMAL LOW (ref >60)
Glucose, Bld: 120 mg/dL — ABNORMAL HIGH (ref 70–99)
Potassium: 4.3 mmol/L (ref 3.5–5.1)
Sodium: 138 mmol/L (ref 135–145)

## 2021-12-30 LAB — RESP PANEL BY RT-PCR (FLU A&B, COVID) ARPGX2
Influenza A by PCR: NEGATIVE
Influenza B by PCR: NEGATIVE
SARS Coronavirus 2 by RT PCR: POSITIVE — AB

## 2021-12-30 LAB — BRAIN NATRIURETIC PEPTIDE: B Natriuretic Peptide: 502.6 pg/mL — ABNORMAL HIGH (ref 0.0–100.0)

## 2021-12-30 MED ORDER — METOPROLOL SUCCINATE ER 25 MG PO TB24
12.5000 mg | ORAL_TABLET | Freq: Every day | ORAL | Status: DC
  Administered 2021-12-31: 12.5 mg via ORAL
  Filled 2021-12-30 (×2): qty 1

## 2021-12-30 MED ORDER — IOHEXOL 300 MG/ML  SOLN
80.0000 mL | Freq: Once | INTRAMUSCULAR | Status: AC | PRN
  Administered 2021-12-30: 80 mL via INTRAVENOUS

## 2021-12-30 MED ORDER — HYDRALAZINE HCL 20 MG/ML IJ SOLN
10.0000 mg | Freq: Once | INTRAMUSCULAR | Status: AC
  Administered 2021-12-30: 10 mg via INTRAVENOUS
  Filled 2021-12-30: qty 1

## 2021-12-30 MED ORDER — LEVOTHYROXINE SODIUM 100 MCG PO TABS
100.0000 ug | ORAL_TABLET | Freq: Every day | ORAL | Status: DC
  Administered 2021-12-31: 100 ug via ORAL
  Filled 2021-12-30: qty 1

## 2021-12-30 MED ORDER — SODIUM CHLORIDE 0.9% FLUSH: 3.0000 mL | INTRAVENOUS | Status: DC | PRN

## 2021-12-30 MED ORDER — HYDRALAZINE HCL 20 MG/ML IJ SOLN: 10.0000 mg | Freq: Three times a day (TID) | INTRAMUSCULAR | Status: DC | PRN

## 2021-12-30 MED ORDER — ASPIRIN 325 MG PO TABS
325.0000 mg | ORAL_TABLET | Freq: Every day | ORAL | Status: DC
  Filled 2021-12-30 (×2): qty 1

## 2021-12-30 MED ORDER — SODIUM CHLORIDE 0.9% FLUSH
3.0000 mL | Freq: Two times a day (BID) | INTRAVENOUS | Status: DC
  Administered 2021-12-30 – 2021-12-31 (×2): 3 mL via INTRAVENOUS

## 2021-12-30 MED ORDER — NITROGLYCERIN 0.4 MG SL SUBL: 0.4000 mg | SUBLINGUAL_TABLET | SUBLINGUAL | Status: DC | PRN

## 2021-12-30 MED ORDER — ACETAMINOPHEN 325 MG PO TABS
650.0000 mg | ORAL_TABLET | Freq: Four times a day (QID) | ORAL | Status: DC | PRN
  Administered 2021-12-30 – 2021-12-31 (×2): 650 mg via ORAL
  Filled 2021-12-30 (×2): qty 2

## 2021-12-30 MED ORDER — ACETAMINOPHEN 650 MG RE SUPP
650.0000 mg | Freq: Four times a day (QID) | RECTAL | Status: DC | PRN
Start: 2021-12-30 — End: 2021-12-31

## 2021-12-30 MED ORDER — SODIUM CHLORIDE 0.9 % IV SOLN: 250.0000 mL | INTRAVENOUS | Status: DC | PRN

## 2021-12-30 MED ORDER — DEXAMETHASONE SODIUM PHOSPHATE 10 MG/ML IJ SOLN
10.0000 mg | Freq: Once | INTRAMUSCULAR | Status: AC
  Administered 2021-12-30: 10 mg via INTRAVENOUS
  Filled 2021-12-30: qty 1

## 2021-12-30 MED ORDER — ACETAMINOPHEN 500 MG PO TABS
1000.0000 mg | ORAL_TABLET | Freq: Once | ORAL | Status: AC
  Administered 2021-12-30: 1000 mg via ORAL
  Filled 2021-12-30: qty 2

## 2021-12-30 NOTE — ED Notes (Signed)
Attempted to call report

## 2021-12-30 NOTE — ED Notes (Signed)
Pt transported to CT ?

## 2021-12-30 NOTE — H&P (Signed)
?History and Physical  ? ? ?Patient: Carrie [name] IHK:[id number] DOB: 1941-10-05 ?DOA: 12/30/2021 ?DOS: the patient was seen and examined on 12/30/2021 ?PCP: [name]  ?Patient coming from: Home - lives alone  ? ? ?Chief Complaint: shortness of breath ? ?HPI: Carrie [name] is a 81 y.o. female with medical history significant of follicular lymphoma on chemo q 60 days, CKD stage 3, pulmonary HTN,  recent covid infection 12/07/21 which was mild and only took tylenol, atrial fibrillation on eliquis, HTN, GERD, ACD who presented to ED with concerns for high blood pressure and shortness of breath. She also states her breathing is worse with exertion than normal. She started to get a headache today and feels like she doesn't have as much energy today. Duration of symptoms is hard to obtain. She states her headache is pretty bad. Rates the pain as an 8/10 and is sharp. Located in the back of her head. She states this is the worst of her life. Feels like it was a "thunderclap."  ? ?She states her blood pressure was high yesterday and she took it easy and then it was high today (160/90) so went to an UC. They sent her to Ed. Blood pressure normally runs 120/70.  ? ?No fever/chills, no vision changes, no chest pain or palpitations, +cough from covid, no stomach pain, no N/V/D, no dysuria or other urinary symptoms, no leg swelling. She does endorse orthopnea, but this is not new. No weight gain.  ? ? ?She does not smoke or drink.  ? ? ?ER Course:  vitals: afebrile, bp: 161/91, HR: 71, RR: 18, oxygen: 95%RA but desats with ambulation  ?Pertinent labs: covid positive x1 month, troponin 24>17, d-dimer: .72,  ?CXR: cardiomegaly, no edema/consolidation ?CTA chest: no PE.  ?I Ed given decadron, hydralazine.  ? ? ? ?Review of Systems: As mentioned in the history of present illness. All other systems reviewed and are negative. ?Past Medical History:  ?Diagnosis Date  ? A-fib ([place])   ? Anemia   ? pt denies   ? Atrial  fibrillation with rapid ventricular response ([place]) 06/25/2015  ? Carpal tunnel syndrome, bilateral   ? pt denies   ? Cervical spondylosis without myelopathy 10/25/2013  ? Dental bridge present   ? DJD (degenerative joint disease)   ? Dyspnea   ? sometimes hx of cancer non hodgkins lymphoma   ? Dysrhythmia   ? WENT INTO A FIB IN 2017   ? Elevated troponin 02/18/2015  ? Follicular lymphoma grade 3a ([place]) 02/03/2015  ? hx of nonhodgkins lymphoma x 3   ? Headache   ? sometimes a headache   ? History of echocardiogram   ? Echo 12/16: EF 60-65%, no RWMA, severe LAE  ? History of nuclear stress test   ? Myoview 1/17: EF 55%, Normal study. No ischemia or scar.  ? Hypertension   ? Hypothyroid   ? Nausea without vomiting 06/20/2015  ? Stage III chronic kidney disease ([place]) 07/01/2015  ? Thrush of mouth and esophagus ([place]) 06/25/2015  ? ?Past Surgical History:  ?Procedure Laterality Date  ? ABDOMINAL HYSTERECTOMY    ? APPENDECTOMY    ? BACK SURGERY    ? X5-lumbar-fusion  ? COLONOSCOPY    ? DILATION AND CURETTAGE OF UTERUS    ? LYMPH NODE BIOPSY Right 01/21/2015  ? Procedure: RIGHT GROIN LYMPH NODE BIOPSY;  Surgeon: Erroll Luna, MD;  Location: [facility];  Service: General;  Laterality: Right;  ? MASS  EXCISION Left 08/29/2018  ? Procedure: EXCISION LEFT BACK  MASS;  Surgeon: Erroll Luna, MD;  Location: [facility];  Service: General;  Laterality: Left;  ? Ovarian cyst resection    ? patelar tendon transplants    ? Left/right  ? PORTACATH PLACEMENT Right 02/13/2015  ? Procedure: INSERTION PORT-A-CATH WITH ULTRASOUND;  Surgeon: Erroll Luna, MD;  Location: [facility];  Service: General;  Laterality: Right;  ? PORTACATH PLACEMENT N/A 08/29/2018  ? Procedure: INSERTION PORT-A-CATH WITH ULTRA SOUND ERAS PATHWAY;  Surgeon: Erroll Luna, MD;  Location: [facility];  Service: General;  Laterality: N/A;  ? REVERSE SHOULDER ARTHROPLASTY Left 03/02/2021  ? Procedure: REVERSE SHOULDER ARTHROPLASTY;  Surgeon: Netta Cedars, MD;  Location: [facility];   Service: Orthopedics;  Laterality: Left;  ? TOTAL KNEE ARTHROPLASTY  2011  ? Right  ? TUBAL LIGATION    ? ?Social History:  reports that she has never smoked. She has never used smokeless tobacco. She reports that she does not currently use alcohol. She reports that she does not use drugs. ? ?Allergies  ?Allergen Reactions  ? Amiodarone Other (See Comments)  ?  Hyperthyroidism   ? ? ?Family History  ?Problem Relation Age of Onset  ? Cancer Mother   ?     Breast, lung NHL  ? Cancer Sister   ?     Multiple myeloma  ? ? ?Prior to Admission medications   ?Medication Sig Start Date End Date Taking? Authorizing Provider  ?acetaminophen (TYLENOL) 500 MG tablet Take 500 mg by mouth every 6 (six) hours as needed for moderate pain or headache.    [provider]  ?amoxicillin (AMOXIL) 500 MG capsule TAKE FOUR CAPSULES BY MOUTH ONCE -TAKE 30-60 MINUTES BEFORE DENTAL APPOINTMENT 06/30/21   [provider]  ?apixaban (ELIQUIS) 5 MG TABS tablet Take 5 mg by mouth 2 (two) times daily.     [provider]  ?diclofenac sodium (VOLTAREN) 1 % GEL Apply 2 g topically 3 (three) times daily as needed (knee pain). 08/01/18   Quintella Reichert, MD  ?levothyroxine (SYNTHROID) 100 MCG tablet TAKE ONE TABLET BY MOUTH DAILY THYROID REPLACE CURRENT DOSAGE OF LEVOTHYROXINE WITH THIS LOWER DOSAGE. 06/30/21   [provider]  ?lidocaine-prilocaine (EMLA) cream Apply 1 application topically daily as needed. 04/07/21   [name], MD  ?metoprolol succinate (TOPROL-XL) 25 MG 24 hr tablet Take 0.5 tablets (12.5 mg total) by mouth daily. Take with or immediately following a meal. 06/20/19   Lyda Jester M, PA-C  ?ondansetron (ZOFRAN) 8 MG tablet Take 1 tablet (8 mg total) by mouth every 8 (eight) hours as needed for refractory nausea / vomiting. Start on day 2 after bendamustine chemo. 07/23/20   [name], MD  ?oxyCODONE-acetaminophen (PERCOCET) 5-325 MG tablet Take 0.5-1 tablets by mouth every 6 (six) hours as  needed for moderate pain or severe pain. 03/02/21 03/02/22  Netta Cedars, MD  ?polyvinyl alcohol (LIQUIFILM TEARS) 1.4 % ophthalmic solution Place 1 drop into both eyes as needed for dry eyes.    [provider]  ? ? ?Physical Exam: ?Vitals:  ? 12/30/21 1300 12/30/21 1400 12/30/21 1600 12/30/21 1645  ?BP: 131/79 (!) 142/76 (!) 148/76 (!) 152/79  ?Pulse: 62 62 (!) 59 62  ?Resp: '18 16 15 ' (!) 22  ?Temp:      ?TempSrc:      ?SpO2: 96% 100% 98% 100%  ?Weight:      ?Height:      ? ?General:  Appears calm and  comfortable and is in NAD. Mildly dyspneic with talking ?Eyes:  PERRL, EOMI, normal lids, iris ?ENT:  grossly normal hearing, lips & tongue, mmm; appropriate dentition ?Neck:  no LAD, masses or thyromegaly; no carotid bruits ?Cardiovascular:  RRR, no m/r/g. No LE edema.  ?Respiratory:   CTA bilaterally with no wheezes/rales/rhonchi.  Mildly dyspneic with talking ?Abdomen:  soft, NT, ND, NABS ?Back:   normal alignment, no CVAT ?Skin:  no rash or induration seen on limited exam ?Musculoskeletal:  grossly normal tone BUE/BLE, good ROM, no bony abnormality ?Lower extremity:  No LE edema.  Limited foot exam with no ulcerations.  2+ distal pulses. ?Psychiatric:  grossly normal mood and affect, speech fluent and appropriate, AOx3 ?Neurologic:  CN 2-12 grossly intact, moves all extremities in coordinated fashion, sensation intact ? ? ?Radiological Exams on Admission: ?Independently reviewed - see discussion in A/P where applicable ? ?CT HEAD WO CONTRAST (5MM) ? ?Result Date: 12/30/2021 ?CLINICAL DATA:  Headache. EXAM: CT HEAD WITHOUT CONTRAST TECHNIQUE: Contiguous axial images were obtained from the base of the skull through the vertex without intravenous contrast. RADIATION DOSE REDUCTION: This exam was performed according to the departmental dose-optimization program which includes automated exposure control, adjustment of the mA and/or kV according to patient size and/or use of iterative reconstruction technique.  COMPARISON:  03/04/2019 FINDINGS: Brain: There is no evidence for acute hemorrhage, hydrocephalus, mass lesion, or abnormal extra-axial fluid collection. No definite CT evidence for acute infarction. Patchy

## 2021-12-30 NOTE — ED Notes (Signed)
Patient currently at MRI

## 2021-12-30 NOTE — ED Triage Notes (Signed)
Pt c/o weakness and lightheadedness then continues to state this has been happening for a long time b/c of her chemo treatments. She is now associating these symptoms to her BP which in clinic now is 161/91.  ? ?Pt appears dyspneic rn asked about resp status pt states she feels labored.  ?

## 2021-12-30 NOTE — ED Provider Notes (Signed)
?[facility] ?Provider Note ? ? ?CSN: [id number] ?Arrival date & time: 12/30/21  1032 ? ?  ? ?History ? ?Chief Complaint  ?Patient presents with  ? Shortness of Breath  ? ? ?Carrie [name] is a 81 y.o. female. ? ? ?Shortness of Breath ? ?81 year old female with a history of CKD, atrial fibrillation with RVR, follicular lymphoma in remission, HTN, pulmonary hypertension, recent diagnosis of COVID-19 in mid February who presents to the emergency department with worsening shortness of breath, dyspnea on exertion, cough. ? ?The patient endorses a cough, fatigue, myalgias, headache.  She has had symptoms since her COVID diagnosis in mid February.  She endorses worsening dyspnea over the past 2 days to the point where she has decided to present to the emergency department for further evaluation.  She denies any current fevers or chills.  She states that her cough is mildly productive of sputum.  Sputum characteristic is yellow.  She denies any current chest pain.  She endorses a tension type headache diffusely radiating across her forehead to her back. ? ?Home Medications ?Prior to Admission medications   ?Medication Sig Start Date End Date Taking? Authorizing Provider  ?acetaminophen (TYLENOL) 500 MG tablet Take 500 mg by mouth every 6 (six) hours as needed for moderate pain or headache.   Yes [provider]  ?amoxicillin (AMOXIL) 500 MG capsule Take 2,000 mg by mouth once. Take 30-60 minutes before dental appointment 06/30/21  Yes [provider]  ?apixaban (ELIQUIS) 5 MG TABS tablet Take 5 mg by mouth 2 (two) times daily.    Yes [provider]  ?diclofenac sodium (VOLTAREN) 1 % GEL Apply 2 g topically 3 (three) times daily as needed (knee pain). 08/01/18  Yes Quintella Reichert, MD  ?levothyroxine (SYNTHROID) 100 MCG tablet Take 100 mcg by mouth daily before breakfast. 06/30/21  Yes [provider]  ?lidocaine-prilocaine (EMLA) cream Apply 1  application topically daily as needed. ?Patient taking differently: Apply 1 application. topically daily as needed (port). 04/07/21  Yes [name], MD  ?metoprolol succinate (TOPROL-XL) 25 MG 24 hr tablet Take 0.5 tablets (12.5 mg total) by mouth daily. Take with or immediately following a meal. 06/20/19  Yes Lyda Jester M, PA-C  ?ondansetron (ZOFRAN) 8 MG tablet Take 1 tablet (8 mg total) by mouth every 8 (eight) hours as needed for refractory nausea / vomiting. Start on day 2 after bendamustine chemo. 07/23/20  Yes [name], MD  ?oxyCODONE-acetaminophen (PERCOCET) 5-325 MG tablet Take 0.5-1 tablets by mouth every 6 (six) hours as needed for moderate pain or severe pain. 03/02/21 03/02/22 Yes Netta Cedars, MD  ?polyvinyl alcohol (LIQUIFILM TEARS) 1.4 % ophthalmic solution Place 1 drop into both eyes as needed for dry eyes.   Yes [provider]  ?   ? ?Allergies    ?Amiodarone   ? ?Review of Systems   ?Review of Systems  ?Respiratory:  Positive for shortness of breath.   ?All other systems reviewed and are negative. ? ?Physical Exam ?Updated Vital Signs ?BP (!) 170/92 (BP Location: Right Arm)   Pulse 74   Temp 98 ?F (36.7 ?C) (Oral)   Resp 15   Ht '5\' 1"'$  (1.549 m)   Wt 65.1 kg   SpO2 97%   BMI 27.12 kg/m?  ?Physical Exam ?Vitals and nursing note reviewed.  ?Constitutional:   ?   General: She is not in acute distress. ?HENT:  ?   Head: Normocephalic and atraumatic.  ?Eyes:  ?  Conjunctiva/sclera: Conjunctivae normal.  ?   Pupils: Pupils are equal, round, and reactive to light.  ?Cardiovascular:  ?   Rate and Rhythm: Normal rate and regular rhythm.  ?Pulmonary:  ?   Effort: Pulmonary effort is normal. No respiratory distress.  ?Abdominal:  ?   General: There is no distension.  ?   Tenderness: There is no guarding.  ?Musculoskeletal:     ?   General: No deformity or signs of injury.  ?   Cervical back: Neck supple.  ?   Right lower leg: No edema.  ?   Left lower leg: No edema.  ?Skin: ?    Findings: No lesion or rash.  ?Neurological:  ?   General: No focal deficit present.  ?   Mental Status: She is alert and oriented to person, place, and time. Mental status is at baseline.  ?   Cranial Nerves: No cranial nerve deficit.  ?   Motor: No weakness.  ? ? ?ED Results / Procedures / Treatments   ?Labs ?(all labs ordered are listed, but only abnormal results are displayed) ?Labs Reviewed  ?RESP PANEL BY RT-PCR (FLU A&B, COVID) ARPGX2 - Abnormal; Notable for the following components:  ?    Result Value  ? SARS Coronavirus 2 by RT PCR POSITIVE (*)   ? All other components within normal limits  ?BASIC METABOLIC PANEL - Abnormal; Notable for the following components:  ? Glucose, Bld 120 (*)   ? Creatinine, Ser 1.07 (*)   ? GFR, Estimated 53 (*)   ? All other components within normal limits  ?D-DIMER, QUANTITATIVE - Abnormal; Notable for the following components:  ? D-Dimer, Quant 0.72 (*)   ? All other components within normal limits  ?BASIC METABOLIC PANEL - Abnormal; Notable for the following components:  ? Glucose, Bld 211 (*)   ? BUN 30 (*)   ? Creatinine, Ser 1.36 (*)   ? GFR, Estimated 40 (*)   ? All other components within normal limits  ?BRAIN NATRIURETIC PEPTIDE - Abnormal; Notable for the following components:  ? B Natriuretic Peptide 502.6 (*)   ? All other components within normal limits  ?TSH - Abnormal; Notable for the following components:  ? TSH 0.343 (*)   ? All other components within normal limits  ?TROPONIN I (HIGH SENSITIVITY) - Abnormal; Notable for the following components:  ? Troponin I (High Sensitivity) 24 (*)   ? All other components within normal limits  ?CBC  ?CBC  ?RAPID URINE DRUG SCREEN, HOSP PERFORMED  ?TROPONIN I (HIGH SENSITIVITY)  ? ? ?EKG ?EKG Interpretation ? ?Date/Time:  Wednesday December 30 2021 10:50:59 EDT ?Ventricular Rate:  59 ?PR Interval:  190 ?QRS Duration: 94 ?QT Interval:  414 ?QTC Calculation: 411 ?R Axis:   25 ?Text Interpretation: Sinus rhythm Repol abnrm  suggests ischemia, lateral leads Confirmed by Regan Lemming ([id number]) on 12/30/2021 12:18:11 PM ? ?Radiology ?CT HEAD WO CONTRAST (5MM) ? ?Result Date: 12/30/2021 ?CLINICAL DATA:  Headache. EXAM: CT HEAD WITHOUT CONTRAST TECHNIQUE: Contiguous axial images were obtained from the base of the skull through the vertex without intravenous contrast. RADIATION DOSE REDUCTION: This exam was performed according to the departmental dose-optimization program which includes automated exposure control, adjustment of the mA and/or kV according to patient size and/or use of iterative reconstruction technique. COMPARISON:  03/04/2019 FINDINGS: Brain: There is no evidence for acute hemorrhage, hydrocephalus, mass lesion, or abnormal extra-axial fluid collection. No definite CT evidence for acute infarction. Patchy low attenuation in  the deep hemispheric and periventricular white matter is nonspecific, but likely reflects chronic microvascular ischemic demyelination. A tiny 4 mm focus of hyper attenuation is identified in the left temporal lobe (well seen on coronal image 35 of series 5). This was not visible on the prior CT scan. Vascular: Prominence of the vascular anatomy is compatible with the recent CTA chest earlier today. Skull: No evidence for fracture. No worrisome lytic or sclerotic lesion. Sinuses/Orbits: Chronic mucosal thickening is seen in the ethmoid air cells bilaterally and right sphenoid sinus. Visualized portions of the globes and intraorbital fat are unremarkable. Other: None. IMPRESSION: 1. 4 mm focus of hyper attenuation in the left temporal lobe. Given the recent contrast administration for chest CTA, this could represent a tiny enhancing lesion such as vascular malformation or metastatic deposit (although this would be distinctly atypical for lymphoma). Small focus of acute blood products could also have this appearance as could a small parenchymal calcification. Follow-up MRI of the brain without and with contrast  recommended to further evaluate. 2. Chronic small vessel ischemic disease. 3. Chronic paranasal sinusitis. Electronically Signed   By: Misty Stanley M.D.   On: 12/30/2021 18:34  ? ?CT Angio Chest PE W and/or Wo Contrast ? ?Res

## 2021-12-30 NOTE — Assessment & Plan Note (Addendum)
NSR, rate controlled and does not appear to be contributing to her shortness of breath  ?She has a CHADS2Vasc Score 4 ?Will continue eliquis if head CT results wnl  ?Telemetry  ? ?

## 2021-12-30 NOTE — Plan of Care (Signed)

## 2021-12-30 NOTE — ED Notes (Signed)
Pt placed on 2L O2 [place] per provider. ?

## 2021-12-30 NOTE — ED Notes (Signed)
Patient transported to CT 

## 2021-12-30 NOTE — ED Provider Notes (Signed)
?Carrie [name] ? ? ? ?CSN: [id number] ?Arrival date & time: 12/30/21  6314 ? ? ?  ? ?History   ?Chief Complaint ?Chief Complaint  ?Patient presents with  ? Weakness  ? ? ?HPI ?Carrie [name] is a 81 y.o. female.  ? ?Patient presents today due to concerns of elevated blood pressure.  Patient reports that she has been taking her blood pressure at home and has been averaging 160s/100s over the past 3 to 4 days.  She also complains of weakness, fatigue, headache, shortness of breath.  Patient is a poor historian and is not able to enumerate the duration of symptoms.  Although, she does report that the headache started today and is rated 8/10.  Headache is constant and located on the right side of the head.  Denies any recent head trauma.  Weakness and fatigue has been present for "a while" as she states she has been receiving treatment for "several different cancers" with chemotherapy.  She reports shortness of breath started approximately 3 to 4 days ago.  Denies any chest pain or palpitations.  She also has some associated dizziness but denies blurred vision or nausea or vomiting. ? ? ?Weakness ? ?Past Medical History:  ?Diagnosis Date  ? A-fib ([employer])   ? Anemia   ? pt denies   ? Atrial fibrillation with rapid ventricular response ([place]) 06/25/2015  ? Carpal tunnel syndrome, bilateral   ? pt denies   ? Cervical spondylosis without myelopathy 10/25/2013  ? Dental bridge present   ? DJD (degenerative joint disease)   ? Dyspnea   ? sometimes hx of cancer non hodgkins lymphoma   ? Dysrhythmia   ? WENT INTO A FIB IN 2017   ? Elevated troponin 02/18/2015  ? Follicular lymphoma grade 3a ([place]) 02/03/2015  ? hx of nonhodgkins lymphoma x 3   ? Headache   ? sometimes a headache   ? History of echocardiogram   ? Echo 12/16: EF 60-65%, no RWMA, severe LAE  ? History of nuclear stress test   ? Myoview 1/17: EF 55%, Normal study. No ischemia or scar.  ? Hypertension   ? Hypothyroid   ? Nausea without vomiting 06/20/2015  ? Stage III  chronic kidney disease ([place]) 07/01/2015  ? Thrush of mouth and esophagus ([place]) 06/25/2015  ? ? ?Patient Active Problem List  ? Diagnosis Date Noted  ? Acute otitis media 10/27/2021  ? Secondary hypercoagulable state ([place]) 09/22/2021  ? Influenza A 09/15/2021  ? Cough in adult 09/09/2021  ? H/O total shoulder replacement, left 03/02/2021  ? Deficiency anemia 02/03/2021  ? Pancytopenia, acquired ([place]) 12/03/2020  ? Pulmonary hypertension ([place]) 10/27/2020  ? Thrombocytopenia ([place]) 09/01/2020  ? Goals of care, counseling/discussion 07/23/2020  ? Preventive measure 07/23/2020  ? Chronic leukopenia 09/25/2019  ? Syncope 03/05/2019  ? Postural dizziness with presyncope 03/04/2019  ? Atypical chest pain   ? Near syncope   ? Acute systolic congestive heart failure ([place])   ? Atrial fibrillation with RVR ([place]) 02/25/2019  ? GERD with esophagitis 09/19/2018  ? Skin lesions 06/06/2017  ? Dyspnea on exertion 03/25/2017  ? Anxiety disorder 03/25/2017  ? Generalized weakness 03/19/2017  ? Insomnia disorder 03/19/2017  ? Dysuria 08/31/2016  ? Acute back pain with sciatica, right 08/31/2016  ? Chronic back pain 07/02/2016  ? Quality of life palliative care encounter 03/02/2016  ? Port catheter in place 03/01/2016  ? Skin rash 11/03/2015  ? Chronic atrial fibrillation 09/08/2015  ? Anemia of  chronic disease 07/02/2015  ? Essential hypertension 07/02/2015  ? 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 Chronic kidney disease, stage III (moderate) ([place]) 06/25/2015  ? Paroxysmal atrial fibrillation ([place]) 06/25/2015  ? Atrial fibrillation with rapid ventricular response ([place]) 06/25/2015  ? Chronic knee pain after total replacement of right knee joint 04/09/2015  ? Chronic left shoulder pain 03/31/2015  ? Other fatigue 03/07/2015  ? Grade 3a follicular lymphoma of lymph nodes of multiple regions ([place]) 02/03/2015  ? Hypothyroidism 09/09/2009  ? ? ?Past Surgical History:  ?Procedure Laterality Date  ? ABDOMINAL HYSTERECTOMY    ?  APPENDECTOMY    ? BACK SURGERY    ? X5-lumbar-fusion  ? COLONOSCOPY    ? DILATION AND CURETTAGE OF UTERUS    ? LYMPH NODE BIOPSY Right 01/21/2015  ? Procedure: RIGHT GROIN LYMPH NODE BIOPSY;  Surgeon: Erroll Luna, MD;  Location: [facility];  Service: General;  Laterality: Right;  ? MASS EXCISION Left 08/29/2018  ? Procedure: EXCISION LEFT BACK  MASS;  Surgeon: Erroll Luna, MD;  Location: [facility];  Service: General;  Laterality: Left;  ? Ovarian cyst resection    ? patelar tendon transplants    ? Left/right  ? PORTACATH PLACEMENT Right 02/13/2015  ? Procedure: INSERTION PORT-A-CATH WITH ULTRASOUND;  Surgeon: Erroll Luna, MD;  Location: [facility];  Service: General;  Laterality: Right;  ? PORTACATH PLACEMENT N/A 08/29/2018  ? Procedure: INSERTION PORT-A-CATH WITH ULTRA SOUND ERAS PATHWAY;  Surgeon: Erroll Luna, MD;  Location: [facility];  Service: General;  Laterality: N/A;  ? REVERSE SHOULDER ARTHROPLASTY Left 03/02/2021  ? Procedure: REVERSE SHOULDER ARTHROPLASTY;  Surgeon: Netta Cedars, MD;  Location: [facility];  Service: Orthopedics;  Laterality: Left;  ? TOTAL KNEE ARTHROPLASTY  2011  ? Right  ? TUBAL LIGATION    ? ? ?OB History   ?No obstetric history on file. ?  ? ? ? ?Home Medications   ? ?Prior to Admission medications   ?Medication Sig Start Date End Date Taking? Authorizing Provider  ?acetaminophen (TYLENOL) 500 MG tablet Take 500 mg by mouth every 6 (six) hours as needed for moderate pain or headache.    [provider]  ?amoxicillin (AMOXIL) 500 MG capsule TAKE FOUR CAPSULES BY MOUTH ONCE -TAKE 30-60 MINUTES BEFORE DENTAL APPOINTMENT 06/30/21   [provider]  ?apixaban (ELIQUIS) 5 MG TABS tablet Take 5 mg by mouth 2 (two) times daily.     [provider]  ?diclofenac sodium (VOLTAREN) 1 % GEL Apply 2 g topically 3 (three) times daily as needed (knee pain). 08/01/18   Quintella Reichert, MD  ?levothyroxine (SYNTHROID) 100 MCG tablet TAKE ONE TABLET BY MOUTH DAILY  THYROID REPLACE CURRENT DOSAGE OF LEVOTHYROXINE WITH THIS LOWER DOSAGE. 06/30/21   [provider]  ?lidocaine-prilocaine (EMLA) cream Apply 1 application topically daily as needed. 04/07/21   [name], MD  ?metoprolol succinate (TOPROL-XL) 25 MG 24 hr tablet Take 0.5 tablets (12.5 mg total) by mouth daily. Take with or immediately following a meal. 06/20/19   Lyda Jester M, PA-C  ?ondansetron (ZOFRAN) 8 MG tablet Take 1 tablet (8 mg total) by mouth every 8 (eight) hours as needed for refractory nausea / vomiting. Start on day 2 after bendamustine chemo. 07/23/20   [name], MD  ?oxyCODONE-acetaminophen (PERCOCET) 5-325 MG tablet Take 0.5-1 tablets by mouth every 6 (six) hours as needed for moderate pain or severe pain. 03/02/21 03/02/22  Netta Cedars, MD  ?polyvinyl alcohol ([place])  1.4 % ophthalmic solution Place 1 drop into both eyes as needed for dry eyes.    [provider]  ? ? ?Family History ?Family History  ?Problem Relation Age of Onset  ? Cancer Mother   ?     Breast, lung NHL  ? Cancer Sister   ?     Multiple myeloma  ? ? ?Social History ?Social History  ? ?Tobacco Use  ? Smoking status: Never  ? Smokeless tobacco: Never  ?Vaping Use  ? Vaping Use: Never used  ?Substance Use Topics  ? Alcohol use: Not Currently  ? Drug use: No  ? ? ? ?Allergies   ?Amiodarone ? ? ?Review of Systems ?Review of Systems ?Per HPI ? ?Physical Exam ?Triage Vital Signs ?ED Triage Vitals [12/30/21 0946]  ?Enc Vitals Group  ?   BP (!) 161/91  ?   Pulse Rate 71  ?   Resp 18  ?   Temp 97.7 ?F (36.5 ?C)  ?   Temp Source Oral  ?   SpO2 95 %  ?   Weight   ?   Height   ?   Head Circumference   ?   Peak Flow   ?   Pain Score 0  ?   Pain Loc   ?   Pain Edu?   ?   Excl. in [place]?   ? ?No data found. ? ?Updated Vital Signs ?BP (!) 143/91   Pulse 71   Temp 97.7 ?F (36.5 ?C) (Oral)   Resp 18   SpO2 98%  ? ?Visual Acuity ?Right Eye Distance:   ?Left Eye Distance:   ?Bilateral Distance:   ? ?Right Eye Near:    ?Left Eye Near:    ?Bilateral Near:    ? ?Physical Exam ?Constitutional:   ?   General: She is not in acute distress. ?   Appearance: Normal appearance. She is not toxic-appearing or diaphoretic.  ?HENT:  ?   Head:

## 2021-12-30 NOTE — Assessment & Plan Note (Signed)
?   If this is driving her increased dyspnea ?Repeat echo pending ?Cardiology consulted  ?

## 2021-12-30 NOTE — ED Triage Notes (Signed)
SOB since yesterday, worse on exertion. Has a productive cough. ?

## 2021-12-30 NOTE — Assessment & Plan Note (Addendum)
Elevated to 326 systolic despite taking her medication ?Given x1 dose of hydralazine in ED and bp are much better controlled ?? If the shortness of breath is contributing vs. Pulmonary HTN ?Will continue home medication of toprol-xl 12.'5mg'$  and hydralazine prn only for now, but may need adjustment of her medication  ?HR has been in upper 50s/low 60s so hesitant to increase her toprol.  ? ?

## 2021-12-30 NOTE — Assessment & Plan Note (Signed)
Baseline creatinine appears to be around 1.2-1.3 ?Stable, continue to monitor  ?

## 2021-12-30 NOTE — Assessment & Plan Note (Addendum)
She appears euvolemic with no signs of overload on exam or CXR to contribute to her dyspnea ?-bnp pending ?-strict I/O ?-hx of EF down to 25% in 02/2019, but normal EF on recent echo 10/2020  ?-She did not previously tolerate Entresto due to worsening renal function and  ?hypotension. Per review of: Cardiology note they do not plan on restarting  ?ACE/ARB, or Entresto. ?

## 2021-12-30 NOTE — Discharge Instructions (Signed)
Please go to the emergency department as soon as you leave urgent care for further evaluation and management. ?

## 2021-12-30 NOTE — Assessment & Plan Note (Addendum)
Diagnosed in 2016 with hx of recurrence. Currently on rituximab q 60 days.  ?Last infusion 11/09/21 ? ?

## 2021-12-30 NOTE — Assessment & Plan Note (Addendum)
81 year old female presenting with worsening dyspnea on exertion, increased work of breathing, tachypnea ?-obs to telemetry ?-CTA chest with no acute findings, but does suggest pulmonary HTN ?-CXR with no edema/consolidation ?-not requiring oxygen at rest, but has tachypnea/dyspnea with talking and ? Drops saturation with ambulation  ?-does not appear to be fluid overloaded in a CHF exacerbation and she is not in afib.  ?-BNP pending ?-covid positive, but was positive on 2/20 with mild infection. Has been 23 days, no precautions needed. Question if related to long covid>pulmonary htn?  ?-? If pulmonary HTN is behind symptoms. Cardiology has been consulted ?-echo pending  ?-will check TSH/UDs as well  ?-? SE from rituximab  ?

## 2021-12-30 NOTE — Assessment & Plan Note (Addendum)
With worse headache of her life/thunder clap and high blood pressure check stat head CT ?CT showing 4 mm focus of hyper attenuation in the left temporal lobe: blood products, vs. Metastatic vs. Artifact vs. Vascular malformation ?-MRI brain ordered  ?-hold pm dose of eliquis until results  ?

## 2021-12-31 ENCOUNTER — Other Ambulatory Visit (HOSPITAL_COMMUNITY): Payer: Self-pay

## 2021-12-31 ENCOUNTER — Observation Stay (HOSPITAL_BASED_OUTPATIENT_CLINIC_OR_DEPARTMENT_OTHER): Payer: No Typology Code available for payment source

## 2021-12-31 DIAGNOSIS — R0609 Other forms of dyspnea: Secondary | ICD-10-CM

## 2021-12-31 DIAGNOSIS — I10 Essential (primary) hypertension: Secondary | ICD-10-CM

## 2021-12-31 DIAGNOSIS — I480 Paroxysmal atrial fibrillation: Secondary | ICD-10-CM

## 2021-12-31 DIAGNOSIS — I420 Dilated cardiomyopathy: Secondary | ICD-10-CM

## 2021-12-31 LAB — BASIC METABOLIC PANEL
Anion gap: 13 (ref 5–15)
BUN: 30 mg/dL — ABNORMAL HIGH (ref 8–23)
CO2: 22 mmol/L (ref 22–32)
Calcium: 9 mg/dL (ref 8.9–10.3)
Chloride: 100 mmol/L (ref 98–111)
Creatinine, Ser: 1.36 mg/dL — ABNORMAL HIGH (ref 0.44–1.00)
GFR, Estimated: 40 mL/min — ABNORMAL LOW (ref >60)
Glucose, Bld: 211 mg/dL — ABNORMAL HIGH (ref 70–99)
Potassium: 4.1 mmol/L (ref 3.5–5.1)
Sodium: 135 mmol/L (ref 135–145)

## 2021-12-31 LAB — CBC
HCT: 40.8 % (ref 36.0–46.0)
Hemoglobin: 13.5 g/dL (ref 12.0–15.0)
MCH: 30.1 pg (ref 26.0–34.0)
MCHC: 33.1 g/dL (ref 30.0–36.0)
MCV: 91.1 fL (ref 80.0–100.0)
Platelets: 181 10*3/uL (ref 150–400)
RBC: 4.48 MIL/uL (ref 3.87–5.11)
RDW: 13.7 % (ref 11.5–15.5)
WBC: 6.1 10*3/uL (ref 4.0–10.5)
nRBC: 0 % (ref 0.0–0.2)

## 2021-12-31 LAB — RAPID URINE DRUG SCREEN, HOSP PERFORMED
Amphetamines: NOT DETECTED
Barbiturates: NOT DETECTED
Benzodiazepines: NOT DETECTED
Cocaine: NOT DETECTED
Opiates: NOT DETECTED
Tetrahydrocannabinol: NOT DETECTED

## 2021-12-31 LAB — ECHOCARDIOGRAM COMPLETE
AR max vel: 1.85 cm2
AV Area VTI: 1.84 cm2
AV Area mean vel: 1.72 cm2
AV Mean grad: 3.5 mmHg
AV Peak grad: 6.9 mmHg
Ao pk vel: 1.31 m/s
Area-P 1/2: 2 cm2
Height: 61 in
P 1/2 time: 533 msec
S' Lateral: 3.5 cm
Weight: 2296.31 oz

## 2021-12-31 MED ORDER — AMLODIPINE BESYLATE 10 MG PO TABS
10.0000 mg | ORAL_TABLET | Freq: Every day | ORAL | 0 refills | Status: DC
  Filled 2021-12-31: qty 30, 30d supply, fill #0

## 2021-12-31 MED ORDER — HALOPERIDOL LACTATE 5 MG/ML IJ SOLN: 2.0000 mg | Freq: Once | INTRAMUSCULAR | Status: DC | PRN

## 2021-12-31 MED ORDER — QUETIAPINE FUMARATE 25 MG PO TABS
25.0000 mg | ORAL_TABLET | Freq: Once | ORAL | Status: AC | PRN
  Administered 2021-12-31: 25 mg via ORAL
  Filled 2021-12-31: qty 1

## 2021-12-31 MED ORDER — AMLODIPINE BESYLATE 10 MG PO TABS
10.0000 mg | ORAL_TABLET | Freq: Every day | ORAL | Status: DC
  Administered 2021-12-31: 10 mg via ORAL

## 2021-12-31 MED ORDER — AMLODIPINE BESYLATE 5 MG PO TABS: 5.0000 mg | ORAL_TABLET | Freq: Every day | ORAL | Status: DC

## 2021-12-31 MED ORDER — APIXABAN 5 MG PO TABS
5.0000 mg | ORAL_TABLET | Freq: Two times a day (BID) | ORAL | Status: DC
  Administered 2021-12-31: 5 mg via ORAL
  Filled 2021-12-31: qty 1

## 2021-12-31 MED ORDER — AMLODIPINE BESYLATE 10 MG PO TABS: 10.0000 mg | ORAL_TABLET | Freq: Every day | ORAL | Status: DC

## 2021-12-31 NOTE — Progress Notes (Signed)
Agitated states she cannot sleep with SCDs and removed them and states she is refusing them now. Angry irritable. States many things she doesn't like them like they are but was not specific.  ?

## 2021-12-31 NOTE — Consult Note (Addendum)
CARDIOLOGY CONSULT NOTE  ? ? ? ? ? ?Patient ID: ?Carrie [name] ?MRN: [id number] ?DOB/AGE: 01/18/1941 80 y.o. ? ?Admit date: 12/30/2021 ?Referring Physician: Joseph ?Primary Physician: [facility] ?Primary Cardiologist: [place] ?Reason for Consultation: Dyspnea ? ?Principal Problem: ?  Dyspnea on exertion ?Active Problems: ?  Grade 3a follicular lymphoma of lymph nodes of multiple regions (HCC) ?  Chronic kidney disease, stage III (moderate) (HCC) ?  Essential hypertension ?  Chronic atrial fibrillation ?  Pulmonary hypertension (HCC) ?  Chronic systolic CHF (congestive heart failure) (HCC) ?  Headache ? ? ?HPI:  80 y.o. asked to see by Dr Joseph for dyspnea. History of follicular lymphoma post chemo. With DCM 02/25/19 EF 20-25% in setting of PAF. Cardioverted and started on amiodarone and has maintained NSR. CHADVASC 6 on eliquis EF normalized 3 months latter TTE 10/31/20 EF 55-60% mild/mod MR normal strain normal RV with no evidence of PHT.  Amiodarone has been d/c No history of CAD No chest pain. Noted elevated BP with headache at home. MRI with no acute event ? Amyloid angiopathy. Dyspnea last few weeks no fever/cough Covid positive in February and still positive on admission CTA no PE or interstitial lung dx ? Dilated mPA suggesting elevated PA pressure. BNP elevated 502.  BP improved now on norvasc and Toprol Telemetry with NSR no PAF and ECG no acute changes Headache and breathing better this am TTE pending  ? ?ROS ?All other systems reviewed and negative except as noted above ? ?Past Medical History:  ?Diagnosis Date  ? A-fib (H[facility])   ? Anemia   ? pt denies   ? Atrial fibrillation with rapid ventricular response ([facility]) 06/25/2015  ? Carpal tunnel syndrome, bilateral   ? pt denies   ? Cervical spondylosis without myelopathy 10/25/2013  ? Dental bridge present   ? DJD (degenerative joint disease)   ? Dyspnea   ? sometimes hx of cancer non hodgkins lymphoma   ? Dysrhythmia   ? WENT INTO A FIB IN 2017   ?  Elevated troponin 02/18/2015  ? Follicular lymphoma grade 3a (H[facility]) 02/03/2015  ? hx of nonhodgkins lymphoma x 3   ? Headache   ? sometimes a headache   ? History of echocardiogram   ? Echo 12/16: EF 60-65%, no RWMA, severe LAE  ? History of nuclear stress test   ? Myoview 1/17: EF 55%, Normal study. No ischemia or scar.  ? Hypertension   ? Hypothyroid   ? Nausea without vomiting 06/20/2015  ? Stage III chronic kidney disease (HCC) 07/01/2015  ? Thrush of mouth and esophagus (HCC) 06/25/2015  ?  ?Family History  ?Problem Relation Age of Onset  ? Cancer Mother   ?     Breast, lung NHL  ? Cancer Sister   ?     Multiple myeloma  ?  ?Social History  ? ?Socioeconomic History  ? Marital status: Widowed  ?  Spouse name: Not on file  ? Number of children: 2  ? Years of education: hs  ? Highest education level: Not on file  ?Occupational History  ? Occupation: Retired  ?Tobacco Use  ? Smoking status: Never  ? Smokeless tobacco: Never  ?Vaping Use  ? Vaping Use: Never used  ?Substance and Sexual Activity  ? Alcohol use: Not Currently  ? Drug use: No  ? Sexual activity: Not on file  ?Other Topics Concern  ? Not on file  ?Social History Narrative  ? Lives alone.  Has a   walker for home use.  ? ?Social Determinants of Health  ? ?Financial Resource Strain: Not on file  ?Food Insecurity: Not on file  ?Transportation Needs: Not on file  ?Physical Activity: Not on file  ?Stress: Not on file  ?Social Connections: Not on file  ?Intimate Partner Violence: Not on file  ?  ?Past Surgical History:  ?Procedure Laterality Date  ? ABDOMINAL HYSTERECTOMY    ? APPENDECTOMY    ? BACK SURGERY    ? X5-lumbar-fusion  ? COLONOSCOPY    ? DILATION AND CURETTAGE OF UTERUS    ? LYMPH NODE BIOPSY Right 01/21/2015  ? Procedure: RIGHT GROIN LYMPH NODE BIOPSY;  Surgeon: Thomas Cornett, MD;  [facility];  Service: General;  Laterality: Right;  ? MASS EXCISION Left 08/29/2018  ? Procedure: EXCISION LEFT BACK  MASS;  Surgeon: Cornett, Thomas, MD;   [facility];  Service: General;  Laterality: Left;  ? Ovarian cyst resection    ? patelar tendon transplants    ? Left/right  ? PORTACATH PLACEMENT Right 02/13/2015  ? Procedure: INSERTION PORT-A-CATH WITH ULTRASOUND;  Surgeon: Thomas Cornett, MD;  [facility];  Service: General;  Laterality: Right;  ? PORTACATH PLACEMENT N/A 08/29/2018  ? Procedure: INSERTION PORT-A-CATH WITH ULTRA SOUND ERAS PATHWAY;  Surgeon: Cornett, Thomas, MD;  [facility];  Service: General;  Laterality: N/A;  ? REVERSE SHOULDER ARTHROPLASTY Left 03/02/2021  ? Procedure: REVERSE SHOULDER ARTHROPLASTY;  Surgeon: Norris, Steve, MD;  [facility];  Service: Orthopedics;  Laterality: Left;  ? TOTAL KNEE ARTHROPLASTY  2011  ? Right  ? TUBAL LIGATION    ?  ? ? ?Current Facility-Administered Medications:  ?  0.9 %  sodium chloride infusion, 250 mL, Intravenous, PRN, Wolfe, Allison, MD ?  acetaminophen (TYLENOL) tablet 650 mg, 650 mg, Oral, Q6H PRN, 650 mg at 12/31/21 0930 **OR** acetaminophen (TYLENOL) suppository 650 mg, 650 mg, Rectal, Q6H PRN, Wolfe, Allison, MD ?  amLODipine (NORVASC) tablet 5 mg, 5 mg, Oral, Daily, Joseph, Preetha, MD ?  hydrALAZINE (APRESOLINE) injection 10 mg, 10 mg, Intravenous, Q8H PRN, Wolfe, Allison, MD ?  levothyroxine (SYNTHROID) tablet 100 mcg, 100 mcg, Oral, QAC breakfast, Wolfe, Allison, MD, 100 mcg at 12/31/21 0511 ?  metoprolol succinate (TOPROL-XL) 24 hr tablet 12.5 mg, 12.5 mg, Oral, Daily, Wolfe, Allison, MD, 12.5 mg at 12/31/21 0929 ?  nitroGLYCERIN (NITROSTAT) SL tablet 0.4 mg, 0.4 mg, Sublingual, Q5 min PRN, Lawsing, James, MD ?  sodium chloride flush (NS) 0.9 % injection 3 mL, 3 mL, Intravenous, Q12H, Wolfe, Allison, MD, 3 mL at 12/31/21 0931 ?  sodium chloride flush (NS) 0.9 % injection 3 mL, 3 mL, Intravenous, PRN, Wolfe, Allison, MD ? ?Facility-Administered Medications Ordered in Other Encounters:  ?  ondansetron (ZOFRAN) 8 mg in sodium chloride 0.9 % 50 mL IVPB, , Intravenous, Once,  Gorsuch, Ni, MD ?  sodium chloride 0.9 % injection 10 mL, 10 mL, Intravenous, PRN, Gorsuch, Ni, MD, 10 mL at 04/05/17 1315 ?  sodium chloride flush (NS) 0.9 % injection 10 mL, 10 mL, Intracatheter, Once, Gorsuch, Ni, MD ? amLODipine  5 mg Oral Daily  ? levothyroxine  100 mcg Oral QAC breakfast  ? metoprolol succinate  12.5 mg Oral Daily  ? sodium chloride flush  3 mL Intravenous Q12H  ? ? sodium chloride    ? ? ?Physical Exam: ?BP (!) 170/92 (BP Location: Right Arm)   Pulse 74   Temp 98 ?F (36.7 ?C) (Oral)     Resp 15   Ht 5' 1" (1.549 m)   Wt 65.1 kg   SpO2 97%   BMI 27.12 kg/m?  ? ? ?Affect appropriate ?Elderly female  ?HEENT: normal ?Neck supple with no adenopathy Port a cath right subclavian tunneled  ?JVP normal no bruits no thyromegaly ?Lungs clear with no wheezing and good diaphragmatic motion ?Heart:  S1/S2 SEM  murmur, no rub, gallop or click ?PMI normal ?Abdomen: benighn, BS positve, no tenderness, no AAA ?no bruit.  No HSM or HJR ?Distal pulses intact with no bruits ?No edema ?Neuro non-focal ?Skin warm and dry ?No muscular weakness ? ? ?Labs: ?  ?Lab Results  ?Component Value Date  ? WBC 6.1 12/31/2021  ? HGB 13.5 12/31/2021  ? HCT 40.8 12/31/2021  ? MCV 91.1 12/31/2021  ? PLT 181 12/31/2021  ?  ?Recent Labs  ?Lab 12/31/21 ?0402  ?NA 135  ?K 4.1  ?CL 100  ?CO2 22  ?BUN 30*  ?CREATININE 1.36*  ?CALCIUM 9.0  ?GLUCOSE 211*  ? ?Lab Results  ?Component Value Date  ? TROPONINI <0.03 03/04/2019  ? No results found for: CHOL ?No results found for: HDL ?No results found for: [place] ?No results found for: TRIG ?No results found for: CHOLHDL ?No results found for: LDLDIRECT  ?  ?Radiology: ?CT HEAD WO CONTRAST (5MM) ? ?Result Date: 12/30/2021 ?CLINICAL DATA:  Headache. EXAM: CT HEAD WITHOUT CONTRAST TECHNIQUE: Contiguous axial images were obtained from the base of the skull through the vertex without intravenous contrast. RADIATION DOSE REDUCTION: This exam was performed according to the departmental  dose-optimization program which includes automated exposure control, adjustment of the mA and/or kV according to patient size and/or use of iterative reconstruction technique. COMPARISON:  03/04/2019 FINDINGS: Brain: There i

## 2021-12-31 NOTE — Discharge Summary (Signed)
Physician Discharge Summary  ?Carrie [name]:[id number] DOB: 09-Apr-1941 DOA: 12/30/2021 ? ?PCP: [name] ? ?Admit date: 12/30/2021 ?Discharge date: 12/31/2021 ? ?Time spent: 35 minutes ? ?Recommendations for Outpatient Follow-up:  ?Cardiology Dr. Johney Frame in 2 to 3 weeks, please review echocardiogram ? ? ?Discharge Diagnoses:  ?Principal Problem: ?  Dyspnea on exertion ?Active Problems: ?  Essential hypertension ?  Headache ?  Chronic atrial fibrillation ?  Chronic systolic CHF (congestive heart failure) ([place]) ?  Grade 3a follicular lymphoma of lymph nodes of multiple regions [facility]) ?  Chronic kidney disease, stage III (moderate) (HCC) ? ? ?Discharge Condition: stable ? ?Diet recommendation: low sodium ? ?Filed Weights  ? 12/30/21 1050 12/30/21 2139  ?Weight: 60.8 kg 65.1 kg  ? ? ?History of present illness:  ?Carrie [name] is a 81 y.o. female with medical history significant of follicular lymphoma on chemo q 60 days, CKD stage 3, pulmonary HTN,  recent covid infection 12/07/21 which was mild and only took tylenol, atrial fibrillation on eliquis, HTN, GERD, ACD who presented to ED with concerns for high blood pressure, headache and transient dyspnea. ? ?Hospital Course:  ? ?Elevated blood pressure, headache ?Transient dyspnea ?-Admitted overnight for observation, EKG was unremarkable, high-sensitivity troponins were negative, CTA chest was negative for PE ?-Symptoms improved and resolved started on low-dose amlodipine this admission ?-Seen by cardiology in consultation, had a 2D echocardiogram completed, noted to have EF of 50-55% with some inferobasal hypokinesis, mild PAH. ?-Previously in 2020 her EF was as low as 25%, since recovered to 55-60% range ?-Clinically noted to be euvolemic, she is not on diuretics at baseline and none felt to be indicated at this time, she is on Toprol at baseline, resumed ?-Cardiology recommended outpatient follow-up with Dr. Johney Frame with additional work-up  then ? ?Paroxysmal atrial fibrillation ?-In sinus rhythm at this time, continue metoprolol, Eliquis  ? ?Grade 3a follicular lymphoma of lymph nodes of multiple regions [facility]) ?-Currently on rituximab q 60 days.  ?Last infusion 11/09/21, followed by Dr.Gorsuch ? ?Chronic medical problems remained stable ? ?Consultations: ?Cardiology ? ?Discharge Exam: ?Vitals:  ? 12/31/21 0756 12/31/21 1144  ?BP: (!) 170/92 (!) 150/71  ?Pulse: 74 66  ?Resp: 15 19  ?Temp: 98 ?F (36.7 ?C) 98.2 ?F (36.8 ?C)  ?SpO2: 97% 90%  ? ? ? ? ?Discharge Instructions ? ? ? ?Allergies as of 12/31/2021   ? ?   Reactions  ? Amiodarone Other (See Comments)  ? Hyperthyroidism   ? ?  ? ?  ?Medication List  ?  ? ?TAKE these medications   ? ?acetaminophen 500 MG tablet ?Commonly known as: TYLENOL ?Take 500 mg by mouth every 6 (six) hours as needed for moderate pain or headache. ?  ?amLODipine 10 MG tablet ?Commonly known as: NORVASC ?Take 1 tablet (10 mg total) by mouth daily. ?Start taking on: January 01, 2022 ?  ?amoxicillin 500 MG capsule ?Commonly known as: AMOXIL ?Take 2,000 mg by mouth once. Take 30-60 minutes before dental appointment ?  ?diclofenac sodium 1 % Gel ?Commonly known as: VOLTAREN ?Apply 2 g topically 3 (three) times daily as needed (knee pain). ?  ?Eliquis 5 MG Tabs tablet ?Generic drug: apixaban ?Take 5 mg by mouth 2 (two) times daily. ?  ?levothyroxine 100 MCG tablet ?Commonly known as: SYNTHROID ?Take 100 mcg by mouth daily before breakfast. ?  ?lidocaine-prilocaine cream ?Commonly known as: EMLA ?Apply 1 application topically daily as needed. ?What changed: reasons to take this ?  ?metoprolol  succinate 25 MG 24 hr tablet ?Commonly known as: TOPROL-XL ?Take 0.5 tablets (12.5 mg total) by mouth daily. Take with or immediately following a meal. ?  ?ondansetron 8 MG tablet ?Commonly known as: Zofran ?Take 1 tablet (8 mg total) by mouth every 8 (eight) hours as needed for refractory nausea / vomiting. Start on day 2 after bendamustine  chemo. ?  ?oxyCODONE-acetaminophen 5-325 MG tablet ?Commonly known as: Percocet ?Take 0.5-1 tablets by mouth every 6 (six) hours as needed for moderate pain or severe pain. ?  ?polyvinyl alcohol 1.4 % ophthalmic solution ?Commonly known as: LIQUIFILM TEARS ?Place 1 drop into both eyes as needed for dry eyes. ?  ? ?  ? ?Allergies  ?Allergen Reactions  ? Amiodarone Other (See Comments)  ?  Hyperthyroidism   ? ? Follow-up Information   ? ? [name], MD. Schedule an appointment as soon as possible for a visit in 2 week(s).   ?Specialties: Cardiology, Radiology ?Contact information: ?[address] ?[address] ?[place] [address] ?[phone] ? ? ?  ?  ? ?  ?  ? ?  ? ? ? ?The results of significant diagnostics from this hospitalization (including imaging, microbiology, ancillary and laboratory) are listed below for reference.   ? ?Significant Diagnostic Studies: ?CT HEAD WO CONTRAST (5MM) ? ?Result Date: 12/30/2021 ?CLINICAL DATA:  Headache. EXAM: CT HEAD WITHOUT CONTRAST TECHNIQUE: Contiguous axial images were obtained from the base of the skull through the vertex without intravenous contrast. RADIATION DOSE REDUCTION: This exam was performed according to the departmental dose-optimization program which includes automated exposure control, adjustment of the mA and/or kV according to patient size and/or use of iterative reconstruction technique. COMPARISON:  03/04/2019 FINDINGS: Brain: There is no evidence for acute hemorrhage, hydrocephalus, mass lesion, or abnormal extra-axial fluid collection. No definite CT evidence for acute infarction. Patchy low attenuation in the deep hemispheric and periventricular white matter is nonspecific, but likely reflects chronic microvascular ischemic demyelination. A tiny 4 mm focus of hyper attenuation is identified in the left temporal lobe (well seen on coronal image 35 of series 5). This was not visible on the prior CT scan. Vascular: Prominence of the vascular  anatomy is compatible with the recent CTA chest earlier today. Skull: No evidence for fracture. No worrisome lytic or sclerotic lesion. Sinuses/Orbits: Chronic mucosal thickening is seen in the ethmoid air cells bilaterally and right sphenoid sinus. Visualized portions of the globes and intraorbital fat are unremarkable. Other: None. IMPRESSION: 1. 4 mm focus of hyper attenuation in the left temporal lobe. Given the recent contrast administration for chest CTA, this could represent a tiny enhancing lesion such as vascular malformation or metastatic deposit (although this would be distinctly atypical for lymphoma). Small focus of acute blood products could also have this appearance as could a small parenchymal calcification. Follow-up MRI of the brain without and with contrast recommended to further evaluate. 2. Chronic small vessel ischemic disease. 3. Chronic paranasal sinusitis. Electronically Signed   By: Misty Stanley M.D.   On: 12/30/2021 18:34  ? ?CT Angio Chest PE W and/or Wo Contrast ? ?Result Date: 12/30/2021 ?CLINICAL DATA:  Shortness of breath and elevated D-dimer. History of lymphoma. EXAM: CT ANGIOGRAPHY CHEST WITH CONTRAST TECHNIQUE: Multidetector CT imaging of the chest was performed using the standard protocol during bolus administration of intravenous contrast. Multiplanar CT image reconstructions and MIPs were obtained to evaluate the vascular anatomy. RADIATION DOSE REDUCTION: This exam was performed according to the departmental dose-optimization program which  includes automated exposure control, adjustment of the mA and/or kV according to patient size and/or use of iterative reconstruction technique. CONTRAST:  47m OMNIPAQUE IOHEXOL 300 MG/ML  SOLN COMPARISON:  Chest x-ray from same day. Multiple prior CT chests, most recently dated July 08, 2021. FINDINGS: Cardiovascular: Satisfactory opacification of the pulmonary arteries to the segmental level. No evidence of pulmonary embolism. Similar  enlarged main pulmonary artery measuring up to 4.0 cm in diameter. Stable cardiomegaly. No pericardial effusion. No thoracic aortic aneurysm. Coronary, aortic arch, and branch vessel atherosclerotic vascular disease.

## 2021-12-31 NOTE — Progress Notes (Signed)
Pt got oob states she was wanting to see the lights that were on outside her house and was heading to go to the door of her room, removed her tele and 02 sat, angry awake. Confused states she knows what she is doing and she is at [facility] hospital after attempt to reorient pt she stated she would try to sleep but with all the noise and lights around here she couldn't sleep. Pt continues to be restless and agitated moving in bed almost continually often interfering with her tele by the constant movement.  ?

## 2021-12-31 NOTE — Progress Notes (Addendum)
Admitted from emergency room via stretcher with mask to room 12 on [address]. Alert awake but irritable. Pt non verbal but throwing her things in room and acting aggressive. No verbal complaints made outloud.  ?

## 2021-12-31 NOTE — Care Management Obs Status (Signed)
MEDICARE OBSERVATION STATUS NOTIFICATION ? ? ?Patient Details  ?Name: Carrie [name] ?MRN: [id number] ?Date of Birth: 1941-10-17 ? ? ?Medicare Observation Status Notification Given:    ? ? ? ?Cyndi Bender, RN ?12/31/2021, 12:09 PM ?

## 2021-12-31 NOTE — TOC Initial Note (Addendum)
Transition of Care (TOC) - Initial/Assessment Note  ? ? ?Patient Details  ?Name: Carrie [name] ?MRN: [id number] ?Date of Birth: 06/17/1941 ? ?Transition of Care (TOC) CM/SW Contact:    ?Cyndi Bender, RN ?Phone Number: ?12/31/2021, 1:42 PM ? ?Clinical Narrative:                 ?Spoke to patient regarding transition needs. ?Patient lives alone and has a RW, but doesn't use.  ?[facility]. PCP is Tessie Eke. [place]asmine [phone] ? ?Expected Discharge Plan: Home/Self Care ?Barriers to Discharge: Continued Medical Work up ? ? ?Patient Goals and CMS Choice ?Patient states their goals for this hospitalization and ongoing recovery are:: go home ?  ?  ? ?Expected Discharge Plan and Services ?Expected Discharge Plan: Home/Self Care ?  ?Discharge Planning Services: CM Consult ?  ?Living arrangements for the past 2 months: [place] ?                ?  ?  ?  ?  ?  ?  ?  ?  ?  ?  ? ?Prior Living Arrangements/Services ?Living arrangements for the past 2 months: [place] ?Lives with:: Self ?Patient language and need for interpreter reviewed:: Yes ?Do you feel safe going back to the place where you live?: Yes      ?Need for Family Participation in Patient Care: Yes (Comment) ?Care giver support system in place?: Yes (comment) ?Current home services: DME ([facility]) ?Criminal Activity/Legal Involvement Pertinent to Current Situation/Hospitalization: No - Comment as needed ? ?Activities of Daily Living ?Home Assistive Devices/Equipment: None ?ADL Screening (condition at time of admission) ?Patient's cognitive ability adequate to safely complete daily activities?: Yes ?Is the patient deaf or have difficulty hearing?: No ?Does the patient have difficulty seeing, even when wearing glasses/contacts?: No ?Does the patient have difficulty concentrating, remembering, or making decisions?: Yes ?Patient able to express need for assistance with ADLs?: Yes ?Does the  patient have difficulty dressing or bathing?: No ?Independently performs ADLs?: Yes (appropriate for developmental age) ?Does the patient have difficulty walking or climbing stairs?: Yes ?Weakness of Legs: Right ?Weakness of Arms/Hands: None ? ?Permission Sought/Granted ?Permission sought to share information with : Customer service manager ?  ?   ?   ?   ?   ? ?Emotional Assessment ?Appearance:: Appears stated age ?Attitude/Demeanor/Rapport: Engaged ?Affect (typically observed): Accepting ?Orientation: : Oriented to Self, Oriented to Place, Oriented to  Time, Oriented to Situation ?Alcohol / Substance Use: Not Applicable ?Psych Involvement: No (comment) ? ?Admission diagnosis:  Shortness of breath [R06.02] ?Dyspnea on exertion [R06.09] ?Acute respiratory failure with hypoxia ([place]) [J96.01] ?Pulmonary HTN ([place]) [I27.20] ?COVID-19 [U07.1] ?Patient Active Problem List  ? Diagnosis Date Noted  ? Chronic systolic CHF (congestive heart failure) ([place]) 12/30/2021  ? Headache 12/30/2021  ? Acute otitis media 10/27/2021  ? Secondary hypercoagulable state ([place]) 09/22/2021  ? Influenza A 09/15/2021  ? Cough in adult 09/09/2021  ? H/O total shoulder replacement, left 03/02/2021  ? Deficiency anemia 02/03/2021  ? Pancytopenia, acquired ([place]) 12/03/2020  ? Pulmonary hypertension ([place]) 10/27/2020  ? Thrombocytopenia ([place]) 09/01/2020  ? Goals of care, counseling/discussion 07/23/2020  ? Chronic leukopenia 09/25/2019  ? Syncope 03/05/2019  ? Postural dizziness with presyncope 03/04/2019  ? Atypical chest pain   ? Near syncope   ? Acute systolic congestive heart failure ([place])   ? Atrial fibrillation with RVR ([place]) 02/25/2019  ? Skin lesions  06/06/2017  ? Dyspnea on exertion 03/25/2017  ? Anxiety disorder 03/25/2017  ? Generalized weakness 03/19/2017  ? Insomnia disorder 03/19/2017  ? Dysuria 08/31/2016  ? Acute back pain with sciatica, right 08/31/2016  ? Chronic back pain 07/02/2016  ? Quality of life palliative care  encounter 03/02/2016  ? Port catheter in place 03/01/2016  ? Skin rash 11/03/2015  ? Chronic atrial fibrillation 09/08/2015  ? Essential hypertension 07/02/2015  ? 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 Chronic kidney disease, stage III (moderate) ([place]) 06/25/2015  ? Atrial fibrillation with rapid ventricular response ([place]) 06/25/2015  ? Chronic knee pain after total replacement of right knee joint 04/09/2015  ? Chronic left shoulder pain 03/31/2015  ? Other fatigue 03/07/2015  ? Grade 3a follicular lymphoma of lymph nodes of multiple regions ([place]) 02/03/2015  ? Hypothyroidism 09/09/2009  ? ?PCP:  [name] ?Pharmacy:   ?[facility] [address] ?[phone] ?[place] [address] ?Phone: [phone] Fax: [phone] ? ?[id number] [place] [facility] [address] ?[id number]?Phone: [phone]: [phone]? ?[facility] ?[id number]?Phone: [phone]: [phone]? ? ? ? ?Social Determinants of Health (SDOH) Interventions ?  ? ?Readmission Risk Interventions ?No flowsheet data found. ? ? ?

## 2021-12-31 NOTE — Progress Notes (Signed)
Echocardiogram ?2D Echocardiogram has been performed. ? ?Carrie [name] ?12/31/2021, 1:16 PM ?

## 2022-01-10 DIAGNOSIS — I700 Atherosclerosis of aorta: Secondary | ICD-10-CM | POA: Insufficient documentation

## 2022-01-10 NOTE — Progress Notes (Addendum)
?Cardiology Office Note:   ? ?Date:  01/11/2022  ? ?ID:  Carrie [name], DOB 22-Aug-1941, MRN [id number] ? ?PCP:  [name]  ?[facility] Providers ?Cardiologist:  [name], MD    ?Referring MD: [name]  ? ?Chief Complaint:  Hospitalization Follow-up (Shortness of breath) ?  ? ?Patient Profile: ?White coat Hypertension  ?Follicular lymphoma grade 3 ?Paroxysmal atrial fibrillation  ?Admx 9/16 w AF with RVR in setting of Ecoli urosepsis and AKI ?Recurrent episodes on OP monitor >> anticoagulation  ?Flecainide Rx; Apixaban Rx ?ETT: no VT ?Recurrent AF w RVR >> Amiodarone (02/2019)  ?Recurrent AF w RVR >> s/p DCCV in 11/22  ?HFimpEF (Heart Failure with improved EF) ?Dilated cardiomyopathy ?CMR 11/16: EF 40-45;  However f/u Echocardiogram 12/16: EF 55-60 ?admx in 02/2019 with AF w RVR >> DCCV; Amio Rx ?Echocardiogram 02/2019: EF 20-25 >> Echocardiogram 05/2019: EF 55-60 ?Tachycardia induced CM  ?Echocardiogram 1/22: EF 55-60, Gr 1 DD, GLS -17.9% ?Hypothyroidism  ?Chronic kidney disease stage 3  ?Aortic atherosclerosis (CT 10/2020) ?  ?Prior CV Studies: ?Echocardiogram 12/31/21 ?Inf HK, EF 50-55, normal PASP 38, normal RVSF, mild LAE, mild MR, mild to mod TR, mild AI, AV sclerosis w/o AS ? ?Echocardiogram 10/31/2020 ?EF 55-60, GR 1 DD, GLS -17.9% (normal), normal RVSF, moderate LAE, mild-moderate MR, mild AI ?  ?Echocardiogram 05/24/2019 ?EF 55-60, GRII DD, normal RVSF, mild LAE, mild MR, trivial AI, PASP 28 ?  ?Echocardiogram 02/25/2019 ?EF 20-25, severely reduced RVSF, RVSP 23.5, mild BAE, trivial pericardial effusion, moderate MR, trivial AI ?  ?Echocardiogram 10/15/2015 ?EF 60-65, normal wall motion, severe LAE ?  ?Echo 06/26/15 ?Inferobasal AK, EF 55%, mild AI, mild MR, mild LAE ?  ?Echo 4/16 ?EF 55-60%, normal wall motion, grade 2 diastolic dysfunction, trivial AI, mild MR, moderate LAE, mild TR, trivial PI, PASP 33 mmHg ?  ?Myoview 11/10 ?Normal, EF 66% ?  ?Carotid US 6/05 ?No ICA  stenosis ? ?History of Present Illness:   ?Carrie [name] is a 81 y.o. female with the above problem list.  She was last seen in clinic by Ermalinda Barrios, PA-C for surgical clearance in 4/22.  She had recurrent AF with RVR in Nov 2022 and underwent DCCV in the ED.  She was seen in f/u in the AF Clinic in Dec 2022 and was in NSR at that time.  She was admitted 3/15-3/16 with shortness of breath, elevated BP and HAs.  Her hs-Trops were neg.  CT was neg for PE.  Echocardiogram showed EF 50-55 with inf HK and mildly elevated PASP.  She remained in NSR.  She was seen by Dr. Johnsie Cancel for Cardiology and recommended further workup as an OP.   ? ?She returns for f/u.  She is here alone.  However, her son in law, [name], joins on speaker phone.  The patient continues to have significant fatigue especially when her BP is low (93O-67T systolic).  Her SIL notes that she is significantly short of breath with minimal activity.  She has continued to have a cough since she had the flu and COVID in the last 2-3 mos.  She feels the cough is slowly getting better.  She notes an ache in her chest.  This is substernal and seems to be with activity.  She has no assoc symptoms.  She has not had orthopnea but likes to sleep on 1-2 pillows.  She has not had LE edema.  She has not had syncope.   ?   ?  Past Medical History:  ?Diagnosis Date  ? A-fib ([place])   ? Anemia   ? pt denies   ? Atrial fibrillation with rapid ventricular response ([place]) 06/25/2015  ? Carpal tunnel syndrome, bilateral   ? pt denies   ? Cervical spondylosis without myelopathy 10/25/2013  ? Dental bridge present   ? DJD (degenerative joint disease)   ? Dyspnea   ? sometimes hx of cancer non hodgkins lymphoma   ? Dysrhythmia   ? WENT INTO A FIB IN 2017   ? Elevated troponin 02/18/2015  ? Follicular lymphoma grade 3a ([place]) 02/03/2015  ? hx of nonhodgkins lymphoma x 3   ? Headache   ? sometimes a headache   ? History of echocardiogram   ? Echo 12/16: EF 60-65%, no RWMA, severe LAE  ?  History of nuclear stress test   ? Myoview 1/17: EF 55%, Normal study. No ischemia or scar.  ? Hypertension   ? Hypothyroid   ? Nausea without vomiting 06/20/2015  ? Stage III chronic kidney disease ([place]) 07/01/2015  ? Thrush of mouth and esophagus ([place]) 06/25/2015  ? ?Current Medications: ?Current Meds  ?Medication Sig  ? acetaminophen (TYLENOL) 500 MG tablet Take 500 mg by mouth every 6 (six) hours as needed for moderate pain or headache.  ? amoxicillin (AMOXIL) 500 MG capsule Take 2,000 mg by mouth once. Take 30-60 minutes before dental appointment  ? apixaban (ELIQUIS) 5 MG TABS tablet Take 5 mg by mouth 2 (two) times daily.   ? diclofenac sodium (VOLTAREN) 1 % GEL Apply 2 g topically 3 (three) times daily as needed (knee pain).  ? levothyroxine (SYNTHROID) 100 MCG tablet Take 100 mcg by mouth daily before breakfast.  ? lidocaine-prilocaine (EMLA) cream Apply 1 application topically daily as needed.  ? metoprolol succinate (TOPROL XL) 25 MG 24 hr tablet Take 1 tablet (25 mg total) by mouth in the morning and at bedtime.  ? ondansetron (ZOFRAN) 8 MG tablet Take 1 tablet (8 mg total) by mouth every 8 (eight) hours as needed for refractory nausea / vomiting. Start on day 2 after bendamustine chemo.  ? oxyCODONE-acetaminophen (PERCOCET) 5-325 MG tablet Take 0.5-1 tablets by mouth every 6 (six) hours as needed for moderate pain or severe pain.  ? polyvinyl alcohol (LIQUIFILM TEARS) 1.4 % ophthalmic solution Place 1 drop into both eyes as needed for dry eyes.  ? [DISCONTINUED] amLODipine (NORVASC) 10 MG tablet Take 1 tablet (10 mg total) by mouth daily.  ? [DISCONTINUED] metoprolol succinate (TOPROL-XL) 25 MG 24 hr tablet Take 0.5 tablets (12.5 mg total) by mouth daily. Take with or immediately following a meal.  ?  ?Allergies:   Amiodarone  ? ?Social History  ? ?Tobacco Use  ? Smoking status: Never  ? Smokeless tobacco: Never  ?Vaping Use  ? Vaping Use: Never used  ?Substance Use Topics  ? Alcohol use: Not Currently  ?  Drug use: No  ?  ?Family Hx: ?The patient's family history includes Cancer in her mother and sister. ? ?Review of Systems  ?Respiratory:  Positive for cough.   ?Gastrointestinal:  Negative for hematochezia.  ?Genitourinary:  Negative for hematuria.   ? ?EKGs/Labs/Other Test Reviewed:   ? ?EKG:  EKG is  ordered today.  The ekg ordered today demonstrates atrial fibrillation, HR 125, normal axis, nonspecific ST-T wave changes, similar to prior tracings ? ?Recent Labs: ?09/15/2021: Magnesium 2.2 ?11/09/2021: ALT 9 ?12/30/2021: B Natriuretic Peptide 502.6; TSH 0.343 ?12/31/2021: BUN 30; Creatinine, Ser 1.36;  Hemoglobin 13.5; Platelets 181; Potassium 4.1; Sodium 135  ? ?Recent Lipid Panel ?No results for input(s): CHOL, TRIG, HDL, VLDL, LDLCALC, LDLDIRECT in the last 8760 hours.  ? ?Risk Assessment/Calculations:   ? ?CHA2DS2-VASc Score = 6  ? This indicates a 9.7% annual risk of stroke. ?The patient's score is based upon: ?CHF History: 1 ?HTN History: 1 ?Diabetes History: 0 ?Stroke History: 0 ?Vascular Disease History: 1 ?Age Score: 2 ?Gender Score: 1 ?  ? ?    ?Physical Exam:   ? ?VS:  BP 90/62 (BP Location: Left Arm, Patient Position: Sitting, Cuff Size: Normal)   Pulse 82   Ht '5\' 1"'$  (1.549 m)   Wt 136 lb 6.4 oz (61.9 kg)   SpO2 93%   BMI 25.77 kg/m?    ? ?Wt Readings from Last 3 Encounters:  ?01/11/22 136 lb 6.4 oz (61.9 kg)  ?12/30/21 143 lb 8.3 oz (65.1 kg)  ?11/09/21 139 lb 9.6 oz (63.3 kg)  ?  ?Constitutional:   ?   Appearance: Healthy appearance. Not in distress.  ?Neck:  ?   Vascular: No JVR. JVD normal.  ?Pulmonary:  ?   Effort: Pulmonary effort is normal.  ?   Breath sounds: No wheezing. No rales.  ?Cardiovascular:  ?   Tachycardia present. Irregularly irregular rhythm. Normal S1. Normal S2.   ?   Murmurs: There is no murmur.  ?Edema: ?   Peripheral edema absent.  ?Abdominal:  ?   Palpations: Abdomen is soft. There is no hepatomegaly.  ?Skin: ?   General: Skin is warm and dry.  ?Neurological:  ?   Mental  Status: Alert and oriented to person, place and time.  ?   Cranial Nerves: Cranial nerves are intact.  ?  ?    ?ASSESSMENT & PLAN:   ?Shortness of breath ?She notes fairly significant shortness of breath with minima

## 2022-01-10 NOTE — H&P (View-Only) (Signed)
?Cardiology Office Note:   ? ?Date:  01/11/2022  ? ?ID:  Carrie [name], DOB 11/13/1940, MRN [id number] ? ?PCP:  [name]  ?[facility] Providers ?Cardiologist:  [name], MD    ?Referring MD: [name]  ? ?Chief Complaint:  Hospitalization Follow-up (Shortness of breath) ?  ? ?Patient Profile: ?White coat Hypertension  ?Follicular lymphoma grade 3 ?Paroxysmal atrial fibrillation  ?Admx 9/16 w AF with RVR in setting of Ecoli urosepsis and AKI ?Recurrent episodes on OP monitor >> anticoagulation  ?Flecainide Rx; Apixaban Rx ?ETT: no VT ?Recurrent AF w RVR >> Amiodarone (02/2019)  ?Recurrent AF w RVR >> s/p DCCV in 11/22  ?HFimpEF (Heart Failure with improved EF) ?Dilated cardiomyopathy ?CMR 11/16: EF 40-45;  However f/u Echocardiogram 12/16: EF 55-60 ?admx in 02/2019 with AF w RVR >> DCCV; Amio Rx ?Echocardiogram 02/2019: EF 20-25 >> Echocardiogram 05/2019: EF 55-60 ?Tachycardia induced CM  ?Echocardiogram 1/22: EF 55-60, Gr 1 DD, GLS -17.9% ?Hypothyroidism  ?Chronic kidney disease stage 3  ?Aortic atherosclerosis (CT 10/2020) ?  ?Prior CV Studies: ?Echocardiogram 12/31/21 ?Inf HK, EF 50-55, normal PASP 38, normal RVSF, mild LAE, mild MR, mild to mod TR, mild AI, AV sclerosis w/o AS ? ?Echocardiogram 10/31/2020 ?EF 55-60, GR 1 DD, GLS -17.9% (normal), normal RVSF, moderate LAE, mild-moderate MR, mild AI ?  ?Echocardiogram 05/24/2019 ?EF 55-60, GRII DD, normal RVSF, mild LAE, mild MR, trivial AI, PASP 28 ?  ?Echocardiogram 02/25/2019 ?EF 20-25, severely reduced RVSF, RVSP 23.5, mild BAE, trivial pericardial effusion, moderate MR, trivial AI ?  ?Echocardiogram 10/15/2015 ?EF 60-65, normal wall motion, severe LAE ?  ?Echo 06/26/15 ?Inferobasal AK, EF 55%, mild AI, mild MR, mild LAE ?  ?Echo 4/16 ?EF 55-60%, normal wall motion, grade 2 diastolic dysfunction, trivial AI, mild MR, moderate LAE, mild TR, trivial PI, PASP 33 mmHg ?  ?Myoview 11/10 ?Normal, EF 66% ?  ?Carotid US 6/05 ?No ICA  stenosis ? ?History of Present Illness:   ?Carrie [name] is a 81 y.o. female with the above problem list.  She was last seen in clinic by Ermalinda Barrios, PA-C for surgical clearance in 4/22.  She had recurrent AF with RVR in Nov 2022 and underwent DCCV in the ED.  She was seen in f/u in the AF Clinic in Dec 2022 and was in NSR at that time.  She was admitted 3/15-3/16 with shortness of breath, elevated BP and HAs.  Her hs-Trops were neg.  CT was neg for PE.  Echocardiogram showed EF 50-55 with inf HK and mildly elevated PASP.  She remained in NSR.  She was seen by Dr. Johnsie Cancel for Cardiology and recommended further workup as an OP.   ? ?She returns for f/u.  She is here alone.  However, her son in law, Carrie [name], joins on speaker phone.  The patient continues to have significant fatigue especially when her BP is low (40J-81X systolic).  Her SIL notes that she is significantly short of breath with minimal activity.  She has continued to have a cough since she had the flu and COVID in the last 2-3 mos.  She feels the cough is slowly getting better.  She notes an ache in her chest.  This is substernal and seems to be with activity.  She has no assoc symptoms.  She has not had orthopnea but likes to sleep on 1-2 pillows.  She has not had LE edema.  She has not had syncope.   ?   ?  Past Medical History:  ?Diagnosis Date  ? A-fib ([place])   ? Anemia   ? pt denies   ? Atrial fibrillation with rapid ventricular response ([place]) 06/25/2015  ? Carpal tunnel syndrome, bilateral   ? pt denies   ? Cervical spondylosis without myelopathy 10/25/2013  ? Dental bridge present   ? DJD (degenerative joint disease)   ? Dyspnea   ? sometimes hx of cancer non hodgkins lymphoma   ? Dysrhythmia   ? WENT INTO A FIB IN 2017   ? Elevated troponin 02/18/2015  ? Follicular lymphoma grade 3a ([place]) 02/03/2015  ? hx of nonhodgkins lymphoma x 3   ? Headache   ? sometimes a headache   ? History of echocardiogram   ? Echo 12/16: EF 60-65%, no RWMA, severe LAE  ?  History of nuclear stress test   ? Myoview 1/17: EF 55%, Normal study. No ischemia or scar.  ? Hypertension   ? Hypothyroid   ? Nausea without vomiting 06/20/2015  ? Stage III chronic kidney disease ([place]) 07/01/2015  ? Thrush of mouth and esophagus ([place]) 06/25/2015  ? ?Current Medications: ?Current Meds  ?Medication Sig  ? acetaminophen (TYLENOL) 500 MG tablet Take 500 mg by mouth every 6 (six) hours as needed for moderate pain or headache.  ? amoxicillin (AMOXIL) 500 MG capsule Take 2,000 mg by mouth once. Take 30-60 minutes before dental appointment  ? apixaban (ELIQUIS) 5 MG TABS tablet Take 5 mg by mouth 2 (two) times daily.   ? diclofenac sodium (VOLTAREN) 1 % GEL Apply 2 g topically 3 (three) times daily as needed (knee pain).  ? levothyroxine (SYNTHROID) 100 MCG tablet Take 100 mcg by mouth daily before breakfast.  ? lidocaine-prilocaine (EMLA) cream Apply 1 application topically daily as needed.  ? metoprolol succinate (TOPROL XL) 25 MG 24 hr tablet Take 1 tablet (25 mg total) by mouth in the morning and at bedtime.  ? ondansetron (ZOFRAN) 8 MG tablet Take 1 tablet (8 mg total) by mouth every 8 (eight) hours as needed for refractory nausea / vomiting. Start on day 2 after bendamustine chemo.  ? oxyCODONE-acetaminophen (PERCOCET) 5-325 MG tablet Take 0.5-1 tablets by mouth every 6 (six) hours as needed for moderate pain or severe pain.  ? polyvinyl alcohol (LIQUIFILM TEARS) 1.4 % ophthalmic solution Place 1 drop into both eyes as needed for dry eyes.  ? [DISCONTINUED] amLODipine (NORVASC) 10 MG tablet Take 1 tablet (10 mg total) by mouth daily.  ? [DISCONTINUED] metoprolol succinate (TOPROL-XL) 25 MG 24 hr tablet Take 0.5 tablets (12.5 mg total) by mouth daily. Take with or immediately following a meal.  ?  ?Allergies:   Amiodarone  ? ?Social History  ? ?Tobacco Use  ? Smoking status: Never  ? Smokeless tobacco: Never  ?Vaping Use  ? Vaping Use: Never used  ?Substance Use Topics  ? Alcohol use: Not Currently  ?  Drug use: No  ?  ?Family Hx: ?The patient's family history includes Cancer in her mother and sister. ? ?Review of Systems  ?Respiratory:  Positive for cough.   ?Gastrointestinal:  Negative for hematochezia.  ?Genitourinary:  Negative for hematuria.   ? ?EKGs/Labs/Other Test Reviewed:   ? ?EKG:  EKG is  ordered today.  The ekg ordered today demonstrates atrial fibrillation, HR 125, normal axis, nonspecific ST-T wave changes, similar to prior tracings ? ?Recent Labs: ?09/15/2021: Magnesium 2.2 ?11/09/2021: ALT 9 ?12/30/2021: B Natriuretic Peptide 502.6; TSH 0.343 ?12/31/2021: BUN 30; Creatinine, Ser 1.36;  Hemoglobin 13.5; Platelets 181; Potassium 4.1; Sodium 135  ? ?Recent Lipid Panel ?No results for input(s): CHOL, TRIG, HDL, VLDL, LDLCALC, LDLDIRECT in the last 8760 hours.  ? ?Risk Assessment/Calculations:   ? ?CHA2DS2-VASc Score = 6  ? This indicates a 9.7% annual risk of stroke. ?The patient's score is based upon: ?CHF History: 1 ?HTN History: 1 ?Diabetes History: 0 ?Stroke History: 0 ?Vascular Disease History: 1 ?Age Score: 2 ?Gender Score: 1 ?  ? ?    ?Physical Exam:   ? ?VS:  BP 90/62 (BP Location: Left Arm, Patient Position: Sitting, Cuff Size: Normal)   Pulse 82   Ht '5\' 1"'$  (1.549 m)   Wt 136 lb 6.4 oz (61.9 kg)   SpO2 93%   BMI 25.77 kg/m?    ? ?Wt Readings from Last 3 Encounters:  ?01/11/22 136 lb 6.4 oz (61.9 kg)  ?12/30/21 143 lb 8.3 oz (65.1 kg)  ?11/09/21 139 lb 9.6 oz (63.3 kg)  ?  ?Constitutional:   ?   Appearance: Healthy appearance. Not in distress.  ?Neck:  ?   Vascular: No JVR. JVD normal.  ?Pulmonary:  ?   Effort: Pulmonary effort is normal.  ?   Breath sounds: No wheezing. No rales.  ?Cardiovascular:  ?   Tachycardia present. Irregularly irregular rhythm. Normal S1. Normal S2.   ?   Murmurs: There is no murmur.  ?Edema: ?   Peripheral edema absent.  ?Abdominal:  ?   Palpations: Abdomen is soft. There is no hepatomegaly.  ?Skin: ?   General: Skin is warm and dry.  ?Neurological:  ?   Mental  Status: Alert and oriented to person, place and time.  ?   Cranial Nerves: Cranial nerves are intact.  ?  ?    ?ASSESSMENT & PLAN:   ?Shortness of breath ?She notes fairly significant shortness of breath with minima

## 2022-01-11 ENCOUNTER — Encounter: Payer: Self-pay | Admitting: Physician Assistant

## 2022-01-11 ENCOUNTER — Other Ambulatory Visit: Payer: Self-pay | Admitting: Hematology and Oncology

## 2022-01-11 ENCOUNTER — Other Ambulatory Visit: Payer: Self-pay

## 2022-01-11 ENCOUNTER — Telehealth: Payer: Self-pay

## 2022-01-11 ENCOUNTER — Telehealth: Payer: Self-pay | Admitting: *Deleted

## 2022-01-11 ENCOUNTER — Ambulatory Visit (INDEPENDENT_AMBULATORY_CARE_PROVIDER_SITE_OTHER): Payer: No Typology Code available for payment source | Admitting: Physician Assistant

## 2022-01-11 VITALS — BP 90/62 | HR 82 | Ht 61.0 in | Wt 136.4 lb

## 2022-01-11 DIAGNOSIS — C8238 Follicular lymphoma grade IIIa, lymph nodes of multiple sites: Secondary | ICD-10-CM

## 2022-01-11 DIAGNOSIS — R0602 Shortness of breath: Secondary | ICD-10-CM | POA: Diagnosis not present

## 2022-01-11 DIAGNOSIS — I272 Pulmonary hypertension, unspecified: Secondary | ICD-10-CM | POA: Diagnosis not present

## 2022-01-11 DIAGNOSIS — I5032 Chronic diastolic (congestive) heart failure: Secondary | ICD-10-CM

## 2022-01-11 DIAGNOSIS — I10 Essential (primary) hypertension: Secondary | ICD-10-CM | POA: Diagnosis not present

## 2022-01-11 DIAGNOSIS — I700 Atherosclerosis of aorta: Secondary | ICD-10-CM

## 2022-01-11 DIAGNOSIS — I480 Paroxysmal atrial fibrillation: Secondary | ICD-10-CM

## 2022-01-11 DIAGNOSIS — N1831 Chronic kidney disease, stage 3a: Secondary | ICD-10-CM

## 2022-01-11 MED ORDER — METOPROLOL SUCCINATE ER 25 MG PO TB24: 25.0000 mg | ORAL_TABLET | Freq: Two times a day (BID) | ORAL | 1 refills | Status: DC

## 2022-01-11 NOTE — Assessment & Plan Note (Signed)
EF has been as low as 20-25 in 2020.  This is improved to normal.  Recent echocardiogram demonstrated normal LV function.  She does not demonstrate any evidence of volume excess on exam.  She did have an elevated BNP in the hospital.  At this point, I do not think she needs diuretic therapy.  Proceed with right and left heart catheterization as noted.  If her filling pressures are elevated, we can consider adding spironolactone +/- SGLT2 inhibitor. ?

## 2022-01-11 NOTE — Assessment & Plan Note (Signed)
Dilated pulmonary artery on CT.  Her PASP was only minimally elevated at 38 on echocardiogram.  As noted, she will proceed with right and left heart catheterization. ?

## 2022-01-11 NOTE — Telephone Encounter (Signed)
-----   Message from [name], MD sent at 01/11/2022 12:35 PM EDT ----- ?PLs call her ?Cardiologist reached out to me ?I suggest we hold her treatment and reschedule 2 weeks until her heart stabilize ?Can you call her? If she agrees, I will put in LOS ? ?

## 2022-01-11 NOTE — Assessment & Plan Note (Signed)
She has follow-up with her oncologist tomorrow.  She notes that she was to resume maintenance chemotherapy.  I will reach out to Dr. Alvy Bimler to apprise her of her current situation. ?

## 2022-01-11 NOTE — Assessment & Plan Note (Signed)
She is not on aspirin as she is on Apixaban ?

## 2022-01-11 NOTE — Telephone Encounter (Signed)
Call placed to pt regarding her procedure coming up on Friday, 01/15/22, pt will need to arrive at the hospital at 6:30 instead of 8:30 due to having to be hydrated. ?Left a message for her to call back.  ? ? ? ?

## 2022-01-11 NOTE — Patient Instructions (Addendum)
Medication Instructions:  ?Your physician has recommended you make the following change in your medication:  ? STOP Amlodipine ? INCREASE the Metoprolol to 25 mg taking 1 twice a day ? ? ?*If you need a refill on your cardiac medications before your next appointment, please call your pharmacy* ? ? ?Lab Work: ?None ordered ? ?If you have labs (blood work) drawn today and your tests are completely normal, you will receive your results only by: ?MyChart Message (if you have MyChart) OR ?A paper copy in the mail ?If you have any lab test that is abnormal or we need to change your treatment, we will call you to review the results. ? ? ?Testing/Procedures: ?Your physician has requested that you have a cardiac catheterization. Cardiac catheterization is used to diagnose and/or treat various heart conditions. Doctors may recommend this procedure for a number of different reasons. The most common reason is to evaluate chest pain. Chest pain can be a symptom of coronary artery disease (CAD), and cardiac catheterization can show whether plaque is narrowing or blocking your heart?s arteries. This procedure is also used to evaluate the valves, as well as measure the blood flow and oxygen levels in different parts of your heart. For further information please visit [email or web address]. Please follow instruction sheet BELOW: ? ? ?[facility] OFF[facility] ?[address], [address] ?[place] [address] ?Dept: [phone] ?Loc: [phone] ? ?Carrie [name]  01/11/2022 ? ?You are scheduled for a Cardiac Catheterization on Friday, March 31 with Dr. Lenna Sciara. ? ?1. Please arrive at the Main Entrance A at [facility]: [address], [place] [address] at 8:30 AM (This time is two hours before your procedure to ensure your preparation). Free valet parking service is available.  ? ?Special note: Every effort is made to have your procedure  done on time. Please understand that emergencies sometimes delay scheduled procedures. ? ?2. Diet: Do not eat solid foods after midnight.  You may have clear liquids until 5 AM upon the day of the procedure. ? ?3. Medication instructions in preparation for your procedure: ? ? Contrast Allergy: No ? ? ? Stop taking Eliquis (Apixiban) on Tuesday, March 28.THAT EVENING DOSE WILL BE YOUR LAST DOSE UNTIL AFTER YOUR PROCEDURE.  THEY WILL TELL YOU WHEN TO RESTART IT. ? ? ?On the morning of your procedure, take Aspirin and any morning medicines NOT listed above.  You may use sips of water. ? ?5. Plan to go home the same day, you will only stay overnight if medically necessary. ?6. You MUST have a responsible adult to drive you home. ?7. An adult MUST be with you the first 24 hours after you arrive home. ?8. Bring a current list of your medications, and the last time and date medication taken. ?9. Bring ID and current insurance cards. ?10.Please wear clothes that are easy to get on and off and wear slip-on shoes. ? ?Thank you for allowing [place] to care for you! ?  -- [facility] ? ? ? ? ? ?Your physician recommends that you schedule a follow-up appointment in: AFIB CLINIC 1 WEEK  ?Follow-Up: ?At [facility], you and your health needs are our priority.  As part of our continuing mission to provide you with exceptional heart care, we have created designated Provider Care Teams.  These Care Teams include your primary Cardiologist (physician) and Advanced Practice Providers (APPs -  Physician Assistants and Nurse  Practitioners) who all work together to provide you with the care you need, when you need it. ? ?We recommend signing up for the patient portal called "MyChart".  Sign up information is provided on this After Visit Summary.  MyChart is used to connect with patients for Virtual Visits (Telemedicine).  Patients are able to view lab/test results, encounter notes, upcoming appointments, etc.   Non-urgent messages can be sent to your provider as well.   ?To learn more about what you can do with MyChart, go to [email or web address].   ? ?Your next appointment:   ?2 week(s) ? ?The format for your next appointment:   ?In Person ? ?Provider:   ? [name], PA-C ? ? ?Other Instructions ? ?

## 2022-01-11 NOTE — Telephone Encounter (Signed)
Patient was returning call from the office regarding her upcoming procedure  ?

## 2022-01-11 NOTE — Telephone Encounter (Signed)
Called and given below message. She verbalized understanding. She is in agreement to cancel appts and reschedule. ?Told her the scheduler will call her. ?

## 2022-01-11 NOTE — Assessment & Plan Note (Signed)
Blood pressure is somewhat low today.  She has had low readings at home.  I am adjusting her metoprolol given rapid rate.  Therefore, I am stopping her amlodipine today.  I have asked her to monitor blood pressures at home.  If she has systolic readings 169 or higher, she knows to contact us.  At that point, I would restart her on a lower dose of amlodipine. ?

## 2022-01-11 NOTE — Assessment & Plan Note (Signed)
Recent creatinine stable.  GFR 40.  She will need early arrival with 4 hours of hydration. ?

## 2022-01-11 NOTE — Assessment & Plan Note (Signed)
She notes fairly significant shortness of breath with minimal activity.  Her son-in-law also notes that she struggles with shortness of breath whenever he and his wife are at her house.  She does describe some substernal aching in her chest as well.  Her chest CTA in the hospital did demonstrate coronary calcifications.  She has inferior hypokinesis on her echocardiogram which appears to be new.  I considered setting her up for coronary CTA to further evaluate.  However, she is in atrial fibrillation with rapid rate today.  We will not be able to get good images while she is in atrial fibrillation.  Therefore, I have recommended proceeding with right and left heart catheterization.  I reviewed this with Dr. Gasper Sells (attending MD) who agreed.  She does not appear to be volume overloaded today. ?? Arrange right and left heart catheterization later this week ?

## 2022-01-11 NOTE — Assessment & Plan Note (Signed)
She is back in atrial fibrillation with rapid rate today.  I have asked her to increase her metoprolol succinate to 25 mg twice daily.  Continue Eliquis 5 mg twice daily.  This will need to be held for 2 days prior to her cardiac catheterization.  I will arrange follow-up in our atrial fibrillation clinic next week.  If she does not require PCI and she remains in atrial fibrillation, she can likely be set up for TEE guided cardioversion versus cardioversion after 3 weeks of uninterrupted anticoagulation. ?? Increase metoprolol succinate to 25 mg twice daily ?? Continue Eliquis.  Hold for 2 days prior to cardiac catheterization. ?? Follow-up in atrial fibrillation clinic next week to decide on +/- cardioversion ?

## 2022-01-12 ENCOUNTER — Other Ambulatory Visit: Payer: Medicare HMO

## 2022-01-12 ENCOUNTER — Telehealth: Payer: Self-pay | Admitting: Physician Assistant

## 2022-01-12 ENCOUNTER — Encounter: Payer: Self-pay | Admitting: Hematology and Oncology

## 2022-01-12 ENCOUNTER — Ambulatory Visit: Payer: Medicare HMO | Admitting: Hematology and Oncology

## 2022-01-12 ENCOUNTER — Telehealth: Payer: Self-pay | Admitting: Hematology and Oncology

## 2022-01-12 ENCOUNTER — Ambulatory Visit: Payer: Medicare HMO

## 2022-01-12 NOTE — Telephone Encounter (Signed)
Called pt to schedule per 3/27 inbasket msg, pt requested to wait until after upcoming cardio appt, dr and desk nurse notified via staff message.  ?

## 2022-01-12 NOTE — Telephone Encounter (Signed)
Returned the call to the pt and she has been made aware for her to be at the hospital at 6:30 for hydration before her procedure. ? ?She verbalized understanding. ?

## 2022-01-12 NOTE — Telephone Encounter (Signed)
Returned the call to pt and she has been made aware to take a Aspirin 81 mg on the morning of her Cath. ?

## 2022-01-12 NOTE — Telephone Encounter (Signed)
ASA 81 mg prior to cath ?[name], PA-C    ?01/12/2022 11:36 AM   ?

## 2022-01-12 NOTE — Telephone Encounter (Signed)
New Message: ? ? ? ?Patient saw Scott yesterday. She said on her instructions for Friday, it says take an Aspirin. She wants to know what milligram of Aspirin does she need to take? ? ? ?Pt c/o medication issue: ? ?1. Name of Medication: Aspirin ? ?2. How are you currently taking this medication (dosage and times per day)? No ? ?3. Are you having a reaction (difficulty breathing--STAT)?  ? ?4. What is your medication issue? She need to know the milligram of Aspirin to take the day of her Cath? ? ?

## 2022-01-12 NOTE — Telephone Encounter (Signed)
Covering preop today, I think this was erroneously routed to preop. Will route to [place] for review. Thanks. ?

## 2022-01-14 ENCOUNTER — Telehealth: Payer: Self-pay | Admitting: *Deleted

## 2022-01-14 NOTE — Telephone Encounter (Signed)
Patient wanted to make Cath Lab staff aware she has port- a -cath. ?I will forward to RN Cath Lab and patient will let Short Stay staff nursing staff know about port -a -cath on arrival to Short Stay in the morning. ?

## 2022-01-14 NOTE — Telephone Encounter (Signed)
Patient is following up, requesting to speak with Webb Silversmith, RN again regarding instructions. She states she has a question about a portacath. Please advise. ?

## 2022-01-14 NOTE — Telephone Encounter (Addendum)
Cardiac Catheterization scheduled at [facility] for: Friday January 15, 2022 10:30 AM ?Arrival time and place: [place] Entrance A at: 6:00  AM-pre-procedure hydration ? ? ?No solid food after midnight prior to cath, clear liquids until 5 AM day of procedure. ? ?Medication instructions: ?-Hold: ? Eliquis-none 01/13/22 until post procedure ?-Except hold medications usual morning medications can be taken with sips of water including aspirin 81 mg. ? ?Confirmed patient has responsible adult to drive home post procedure and be with patient first 24 hours after arriving home. ? ?Patient reports no new symptoms concerning for COVID-19/no exposure to COVID-19 in the past 10 days. ? ?Reviewed procedure instructions with patient. ?

## 2022-01-14 NOTE — Telephone Encounter (Signed)
No answer, voicemail message. 

## 2022-01-14 NOTE — Telephone Encounter (Signed)
Reviewed procedure instructions with patient.  

## 2022-01-15 ENCOUNTER — Encounter (HOSPITAL_COMMUNITY)
Admission: RE | Disposition: A | Discharge status: Home / Self Care | Payer: Medicare HMO | Source: Home / Self Care | Attending: Internal Medicine

## 2022-01-15 ENCOUNTER — Other Ambulatory Visit: Payer: Self-pay

## 2022-01-15 ENCOUNTER — Ambulatory Visit (HOSPITAL_COMMUNITY)
Admission: RE | Admit: 2022-01-15 | Discharge: 2022-01-15 | Disposition: A | Discharge status: Home / Self Care | Payer: Medicare HMO | Attending: Internal Medicine | Admitting: Internal Medicine

## 2022-01-15 DIAGNOSIS — I272 Pulmonary hypertension, unspecified: Secondary | ICD-10-CM | POA: Diagnosis not present

## 2022-01-15 DIAGNOSIS — E039 Hypothyroidism, unspecified: Secondary | ICD-10-CM | POA: Insufficient documentation

## 2022-01-15 DIAGNOSIS — I700 Atherosclerosis of aorta: Secondary | ICD-10-CM | POA: Diagnosis not present

## 2022-01-15 DIAGNOSIS — I480 Paroxysmal atrial fibrillation: Secondary | ICD-10-CM | POA: Insufficient documentation

## 2022-01-15 DIAGNOSIS — Z79890 Hormone replacement therapy (postmenopausal): Secondary | ICD-10-CM | POA: Diagnosis not present

## 2022-01-15 DIAGNOSIS — Z79899 Other long term (current) drug therapy: Secondary | ICD-10-CM | POA: Insufficient documentation

## 2022-01-15 DIAGNOSIS — R0602 Shortness of breath: Secondary | ICD-10-CM | POA: Diagnosis present

## 2022-01-15 DIAGNOSIS — Z7901 Long term (current) use of anticoagulants: Secondary | ICD-10-CM | POA: Diagnosis not present

## 2022-01-15 DIAGNOSIS — C8230 Follicular lymphoma grade IIIa, unspecified site: Secondary | ICD-10-CM | POA: Insufficient documentation

## 2022-01-15 DIAGNOSIS — I130 Hypertensive heart and chronic kidney disease with heart failure and stage 1 through stage 4 chronic kidney disease, or unspecified chronic kidney disease: Secondary | ICD-10-CM | POA: Insufficient documentation

## 2022-01-15 DIAGNOSIS — N183 Chronic kidney disease, stage 3 unspecified: Secondary | ICD-10-CM | POA: Insufficient documentation

## 2022-01-15 DIAGNOSIS — I2510 Atherosclerotic heart disease of native coronary artery without angina pectoris: Secondary | ICD-10-CM | POA: Diagnosis not present

## 2022-01-15 DIAGNOSIS — E1122 Type 2 diabetes mellitus with diabetic chronic kidney disease: Secondary | ICD-10-CM | POA: Diagnosis not present

## 2022-01-15 DIAGNOSIS — I5033 Acute on chronic diastolic (congestive) heart failure: Secondary | ICD-10-CM | POA: Diagnosis not present

## 2022-01-15 HISTORY — PX: RIGHT/LEFT HEART CATH AND CORONARY ANGIOGRAPHY: CATH118266

## 2022-01-15 LAB — POCT I-STAT EG7
Acid-Base Excess: 0 mmol/L (ref 0.0–2.0)
Acid-Base Excess: 0 mmol/L (ref 0.0–2.0)
Acid-base deficit: 1 mmol/L (ref 0.0–2.0)
Acid-base deficit: 1 mmol/L (ref 0.0–2.0)
Bicarbonate: 24.1 mmol/L (ref 20.0–28.0)
Bicarbonate: 24.7 mmol/L (ref 20.0–28.0)
Bicarbonate: 24.7 mmol/L (ref 20.0–28.0)
Bicarbonate: 25 mmol/L (ref 20.0–28.0)
Calcium, Ion: 1.08 mmol/L — ABNORMAL LOW (ref 1.15–1.40)
Calcium, Ion: 1.16 mmol/L (ref 1.15–1.40)
Calcium, Ion: 1.17 mmol/L (ref 1.15–1.40)
Calcium, Ion: 1.19 mmol/L (ref 1.15–1.40)
HCT: 32 % — ABNORMAL LOW (ref 36.0–46.0)
HCT: 32 % — ABNORMAL LOW (ref 36.0–46.0)
HCT: 35 % — ABNORMAL LOW (ref 36.0–46.0)
HCT: 35 % — ABNORMAL LOW (ref 36.0–46.0)
Hemoglobin: 10.9 g/dL — ABNORMAL LOW (ref 12.0–15.0)
Hemoglobin: 10.9 g/dL — ABNORMAL LOW (ref 12.0–15.0)
Hemoglobin: 11.9 g/dL — ABNORMAL LOW (ref 12.0–15.0)
Hemoglobin: 11.9 g/dL — ABNORMAL LOW (ref 12.0–15.0)
O2 Saturation: 51 %
O2 Saturation: 55 %
O2 Saturation: 75 %
O2 Saturation: 76 %
Potassium: 3.5 mmol/L (ref 3.5–5.1)
Potassium: 3.7 mmol/L (ref 3.5–5.1)
Potassium: 3.7 mmol/L (ref 3.5–5.1)
Potassium: 3.8 mmol/L (ref 3.5–5.1)
Sodium: 140 mmol/L (ref 135–145)
Sodium: 141 mmol/L (ref 135–145)
Sodium: 141 mmol/L (ref 135–145)
Sodium: 142 mmol/L (ref 135–145)
TCO2: 25 mmol/L (ref 22–32)
TCO2: 26 mmol/L (ref 22–32)
TCO2: 26 mmol/L (ref 22–32)
TCO2: 26 mmol/L (ref 22–32)
pCO2, Ven: 39.6 mmHg — ABNORMAL LOW (ref 44–60)
pCO2, Ven: 41.1 mmHg — ABNORMAL LOW (ref 44–60)
pCO2, Ven: 41.3 mmHg — ABNORMAL LOW (ref 44–60)
pCO2, Ven: 42.9 mmHg — ABNORMAL LOW (ref 44–60)
pH, Ven: 7.367 (ref 7.25–7.43)
pH, Ven: 7.373 (ref 7.25–7.43)
pH, Ven: 7.392 (ref 7.25–7.43)
pH, Ven: 7.402 (ref 7.25–7.43)
pO2, Ven: 28 mmHg — CL (ref 32–45)
pO2, Ven: 30 mmHg — CL (ref 32–45)
pO2, Ven: 40 mmHg (ref 32–45)
pO2, Ven: 41 mmHg (ref 32–45)

## 2022-01-15 LAB — POCT I-STAT 7, (LYTES, BLD GAS, ICA,H+H)
Acid-base deficit: 1 mmol/L (ref 0.0–2.0)
Bicarbonate: 23.3 mmol/L (ref 20.0–28.0)
Calcium, Ion: 1.17 mmol/L (ref 1.15–1.40)
HCT: 35 % — ABNORMAL LOW (ref 36.0–46.0)
Hemoglobin: 11.9 g/dL — ABNORMAL LOW (ref 12.0–15.0)
O2 Saturation: 95 %
Potassium: 3.7 mmol/L (ref 3.5–5.1)
Sodium: 141 mmol/L (ref 135–145)
TCO2: 24 mmol/L (ref 22–32)
pCO2 arterial: 38 mmHg (ref 32–48)
pH, Arterial: 7.396 (ref 7.35–7.45)
pO2, Arterial: 77 mmHg — ABNORMAL LOW (ref 83–108)

## 2022-01-15 SURGERY — RIGHT/LEFT HEART CATH AND CORONARY ANGIOGRAPHY
Anesthesia: LOCAL

## 2022-01-15 MED ORDER — LABETALOL HCL 5 MG/ML IV SOLN: 10.0000 mg | INTRAVENOUS | Status: DC | PRN

## 2022-01-15 MED ORDER — FENTANYL CITRATE (PF) 100 MCG/2ML IJ SOLN
INTRAMUSCULAR | Status: DC | PRN
  Administered 2022-01-15: 25 ug via INTRAVENOUS

## 2022-01-15 MED ORDER — SODIUM CHLORIDE 0.9 % IV SOLN: Freq: Once | INTRAVENOUS | Status: AC

## 2022-01-15 MED ORDER — ACETAMINOPHEN 325 MG PO TABS: 650.0000 mg | ORAL_TABLET | ORAL | Status: DC | PRN

## 2022-01-15 MED ORDER — HYDRALAZINE HCL 20 MG/ML IJ SOLN: 10.0000 mg | INTRAMUSCULAR | Status: DC | PRN

## 2022-01-15 MED ORDER — METOPROLOL TARTRATE 5 MG/5ML IV SOLN
INTRAVENOUS | Status: DC | PRN
  Administered 2022-01-15: 5 mg via INTRAVENOUS

## 2022-01-15 MED ORDER — SODIUM CHLORIDE 0.9% FLUSH: 3.0000 mL | INTRAVENOUS | Status: DC | PRN

## 2022-01-15 MED ORDER — ONDANSETRON HCL 4 MG/2ML IJ SOLN
INTRAMUSCULAR | Status: DC | PRN
Start: 2022-01-15 — End: 2022-01-15
  Administered 2022-01-15: 4 mg via INTRAVENOUS

## 2022-01-15 MED ORDER — IOHEXOL 350 MG/ML SOLN
INTRAVENOUS | Status: DC | PRN
  Administered 2022-01-15: 50 mL

## 2022-01-15 MED ORDER — VERAPAMIL HCL 2.5 MG/ML IV SOLN
INTRAVENOUS | Status: AC
  Filled 2022-01-15: qty 2

## 2022-01-15 MED ORDER — ONDANSETRON HCL 4 MG/2ML IJ SOLN: 4.0000 mg | Freq: Four times a day (QID) | INTRAMUSCULAR | Status: DC | PRN

## 2022-01-15 MED ORDER — HEPARIN SODIUM (PORCINE) 1000 UNIT/ML IJ SOLN
INTRAMUSCULAR | Status: DC | PRN
  Administered 2022-01-15: 5000 [IU] via INTRAVENOUS

## 2022-01-15 MED ORDER — FENTANYL CITRATE (PF) 100 MCG/2ML IJ SOLN
INTRAMUSCULAR | Status: AC
  Filled 2022-01-15: qty 2

## 2022-01-15 MED ORDER — SODIUM CHLORIDE 0.9 % IV SOLN: 250.0000 mL | INTRAVENOUS | Status: DC | PRN

## 2022-01-15 MED ORDER — VERAPAMIL HCL 2.5 MG/ML IV SOLN
INTRAVENOUS | Status: DC | PRN
  Administered 2022-01-15: 5 mL via INTRA_ARTERIAL

## 2022-01-15 MED ORDER — MIDAZOLAM HCL 2 MG/2ML IJ SOLN
INTRAMUSCULAR | Status: AC
  Filled 2022-01-15: qty 2

## 2022-01-15 MED ORDER — ASPIRIN 81 MG PO CHEW: 81.0000 mg | CHEWABLE_TABLET | ORAL | Status: DC

## 2022-01-15 MED ORDER — SODIUM CHLORIDE 0.9 % IV SOLN: INTRAVENOUS | Status: AC

## 2022-01-15 MED ORDER — LIDOCAINE HCL (PF) 1 % IJ SOLN
INTRAMUSCULAR | Status: AC
  Filled 2022-01-15: qty 30

## 2022-01-15 MED ORDER — SODIUM CHLORIDE 0.9 % WEIGHT BASED INFUSION
3.0000 mL/kg/h | INTRAVENOUS | Status: AC
  Administered 2022-01-15: 3 mL/kg/h via INTRAVENOUS

## 2022-01-15 MED ORDER — SODIUM CHLORIDE 0.9 % IV SOLN: INTRAVENOUS | Status: DC

## 2022-01-15 MED ORDER — METOPROLOL TARTRATE 5 MG/5ML IV SOLN
INTRAVENOUS | Status: AC
  Filled 2022-01-15: qty 5

## 2022-01-15 MED ORDER — HEPARIN SODIUM (PORCINE) 1000 UNIT/ML IJ SOLN
INTRAMUSCULAR | Status: AC
  Filled 2022-01-15: qty 10

## 2022-01-15 MED ORDER — HEPARIN (PORCINE) IN NACL 1000-0.9 UT/500ML-% IV SOLN
INTRAVENOUS | Status: AC
  Filled 2022-01-15: qty 500

## 2022-01-15 MED ORDER — LIDOCAINE HCL (PF) 1 % IJ SOLN
INTRAMUSCULAR | Status: DC | PRN
  Administered 2022-01-15: 1 mL
  Administered 2022-01-15: 2 mL

## 2022-01-15 MED ORDER — SODIUM CHLORIDE 0.9% FLUSH: 3.0000 mL | Freq: Two times a day (BID) | INTRAVENOUS | Status: DC

## 2022-01-15 MED ORDER — HEPARIN (PORCINE) IN NACL 1000-0.9 UT/500ML-% IV SOLN
INTRAVENOUS | Status: DC | PRN
Start: 2022-01-15 — End: 2022-01-15
  Administered 2022-01-15 (×2): 500 mL

## 2022-01-15 MED ORDER — SODIUM CHLORIDE 0.9 % WEIGHT BASED INFUSION: 1.0000 mL/kg/h | INTRAVENOUS | Status: DC

## 2022-01-15 MED ORDER — MIDAZOLAM HCL 2 MG/2ML IJ SOLN
INTRAMUSCULAR | Status: DC | PRN
  Administered 2022-01-15: 1 mg via INTRAVENOUS

## 2022-01-15 SURGICAL SUPPLY — 16 items
BAG SNAP BAND KOVER 36X36 (MISCELLANEOUS) ×1 IMPLANT
BAND CMPR LRG ZPHR (HEMOSTASIS) ×1
BAND ZEPHYR COMPRESS 30 LONG (HEMOSTASIS) ×1 IMPLANT
CATH BALLN WEDGE 5F 110CM (CATHETERS) ×1 IMPLANT
CATH DIAG 6FR JR4 (CATHETERS) ×1 IMPLANT
CATH DIAG 6FR PIGTAIL ANGLED (CATHETERS) ×1 IMPLANT
CATH INFINITI 6F FL3.5 (CATHETERS) ×1 IMPLANT
GLIDESHEATH SLEND SS 6F .021 (SHEATH) ×1 IMPLANT
GUIDEWIRE .025 260CM (WIRE) ×1 IMPLANT
GUIDEWIRE INQWIRE 1.5J.035X260 (WIRE) IMPLANT
INQWIRE 1.5J .035X260CM (WIRE) ×2
KIT HEART LEFT (KITS) ×3 IMPLANT
PACK CARDIAC CATHETERIZATION (CUSTOM PROCEDURE TRAY) ×3 IMPLANT
SHEATH GLIDE SLENDER 4/5FR (SHEATH) ×1 IMPLANT
TRANSDUCER W/STOPCOCK (MISCELLANEOUS) ×3 IMPLANT
TUBING CIL FLEX 10 FLL-RA (TUBING) ×3 IMPLANT

## 2022-01-15 NOTE — Progress Notes (Signed)
Patient and family was given discharge instructions. Both verbalized understanding. ?

## 2022-01-15 NOTE — Progress Notes (Signed)
Patient returned from cath lab with a pulse ox of 86%, HR of 91, and B/P of 80/57. Patient asymptomatic. Ali Lowe, MD at bedside. Verbal order for a 500 cc NS bolus and then 100 ml/hr continuous after completion of bolus. Patient placed on 3L of oxygen. MD stated if patients HR continues to be elevated then she will be admitted. MD stated that he will be back to assess patient. Adonis Huguenin, RN (primary RN) made aware.  ?

## 2022-01-15 NOTE — Interval H&P Note (Signed)
History and Physical Interval Note: ? ?01/15/2022 ?7:24 AM ? ?Carrie [name]  has presented today for surgery, with the diagnosis of chest pain - sob.  The various methods of treatment have been discussed with the patient and family. After consideration of risks, benefits and other options for treatment, the patient has consented to  Procedure(s): ?RIGHT/LEFT HEART CATH AND CORONARY ANGIOGRAPHY (N/A) as a surgical intervention.  The patient's history has been reviewed, patient examined, no change in status, stable for surgery.  I have reviewed the patient's chart and labs.  Questions were answered to the patient's satisfaction.   ? ?Cath Lab Visit (complete for each Cath Lab visit) ? ?Clinical Evaluation Leading to the Procedure:  ? ?ACS: No. ? ?Non-ACS:   ? ?Anginal Classification: CCS I ? ?Anti-ischemic medical therapy: Minimal Therapy (1 class of medications) ? ?Non-Invasive Test Results: Low-risk stress test findings: cardiac mortality <1%/year ? ?Prior CABG: No previous CABG ? ? ? ? ? ? ? ?Carrie [name] ? ? ?

## 2022-01-15 NOTE — Discharge Instructions (Addendum)
Resume Eliquis 01/16/22 ? ?Drink plenty of fluid for 48 hours and keep wrist elevated at heart level for 24 hours ? ?Radial Site Care ? ? ?This sheet gives you information about how to care for yourself after your procedure. Your health care provider may also give you more specific instructions. If you have problems or questions, contact your health care provider. ?What can I expect after the procedure? ?After the procedure, it is common to have: ?Bruising and tenderness at the catheter insertion area. ?Follow these instructions at home: ?Medicines ?Take over-the-counter and prescription medicines only as told by your health care provider. ?Insertion site care ?Follow instructions from your health care provider about how to take care of your insertion site. Make sure you: ?Wash your hands with soap and water before you change your bandage (dressing). If soap and water are not available, use hand sanitizer. ?remove your dressing as told by your health care provider. In 24 hours ?Check your insertion site every day for signs of infection. Check for: ?Redness, swelling, or pain. ?Fluid or blood. ?Pus or a bad smell. ?Warmth. ?Do not take baths, swim, or use a hot tub until your health care provider approves. ?You may shower 24-48 hours after the procedure, or as directed by your health care provider. ?Remove the dressing and gently wash the site with plain soap and water. ?Pat the area dry with a clean towel. ?Do not rub the site. That could cause bleeding. ?Do not apply powder or lotion to the site. ?Activity ? ? ?For 24 hours after the procedure, or as directed by your health care provider: ?Do not flex or bend the affected arm. ?Do not push or pull heavy objects with the affected arm. ?Do not drive yourself home from the hospital or clinic. You may drive 24 hours after the procedure unless your health care provider tells you not to. ?Do not operate machinery or power tools. ?Do not lift anything that is heavier than  10 lb (4.5 kg), or the limit that you are told, until your health care provider says that it is safe. For 4 days ?Ask your health care provider when it is okay to: ?Return to work or school. ?Resume usual physical activities or sports. ?Resume sexual activity. ?General instructions ?If the catheter site starts to bleed, raise your arm and put firm pressure on the site. If the bleeding does not stop, get help right away. This is a medical emergency. ?If you went home on the same day as your procedure, a responsible adult should be with you for the first 24 hours after you arrive home. ?Keep all follow-up visits as told by your health care provider. This is important. ?Contact a health care provider if: ?You have a fever. ?You have redness, swelling, or yellow drainage around your insertion site. ?Get help right away if: ?You have unusual pain at the radial site. ?The catheter insertion area swells very fast. ?The insertion area is bleeding, and the bleeding does not stop when you hold steady pressure on the area. ?Your arm or hand becomes pale, cool, tingly, or numb. ?These symptoms may represent a serious problem that is an emergency. Do not wait to see if the symptoms will go away. Get medical help right away. Call your local emergency services (911 in the [place] Do not drive yourself to the hospital. ?Summary ?After the procedure, it is common to have bruising and tenderness at the site. ?Follow instructions from your health care provider about how to take  care of your radial site wound. Check the wound every day for signs of infection. ?Do not lift anything that is heavier than 10 lb (4.5 kg), or the limit that you are told, until your health care provider says that it is safe. ?This information is not intended to replace advice given to you by your health care provider. Make sure you discuss any questions you have with your health care provider. ?Document Revised: 11/09/2017 Document Reviewed:  11/09/2017 ?Elsevier Patient Education ? [place].  ?

## 2022-01-18 ENCOUNTER — Encounter (HOSPITAL_COMMUNITY): Payer: Self-pay | Admitting: Internal Medicine

## 2022-01-18 ENCOUNTER — Telehealth: Payer: Self-pay | Admitting: Cardiology

## 2022-01-18 NOTE — Telephone Encounter (Signed)
Pt c/o medication issue: ? ?1. Name of Medication:  ?apixaban (ELIQUIS) 5 MG TABS tablet ?metoprolol succinate (TOPROL XL) 25 MG 24 hr tablet ? ?2. How are you currently taking this medication (dosage and times per day)? 1 tablet twice a day for both ? ?3. Are you having a reaction (difficulty breathing--STAT)? no ? ?4. What is your medication issue? Patient would like to verify that she is taking her medications correctly. Patient also wants to know if she needs a sooner appointment than 4/18.  ?

## 2022-01-18 NOTE — Telephone Encounter (Signed)
Spoke with the pt and went over her med list verbatim with her over the phone. ?Pt will keep her appt as scheduled for 4/18. ?Pt verbalized understanding and agrees with this plan.  Pt was more than gracious for all the assistance provided. ?

## 2022-01-19 ENCOUNTER — Encounter: Payer: Self-pay | Admitting: Hematology and Oncology

## 2022-01-21 ENCOUNTER — Inpatient Hospital Stay (HOSPITAL_COMMUNITY)
Admission: EM | Admit: 2022-01-21 | Discharge: 2022-01-24 | DRG: 308 | Disposition: A | Discharge status: Home / Self Care | Payer: Medicare HMO | Attending: Internal Medicine | Admitting: Internal Medicine

## 2022-01-21 ENCOUNTER — Encounter (HOSPITAL_COMMUNITY): Payer: Self-pay

## 2022-01-21 ENCOUNTER — Emergency Department (HOSPITAL_COMMUNITY): Payer: Medicare HMO

## 2022-01-21 ENCOUNTER — Other Ambulatory Visit: Payer: Self-pay

## 2022-01-21 ENCOUNTER — Telehealth: Payer: Self-pay | Admitting: Cardiology

## 2022-01-21 DIAGNOSIS — I10 Essential (primary) hypertension: Secondary | ICD-10-CM | POA: Diagnosis present

## 2022-01-21 DIAGNOSIS — C8290 Follicular lymphoma, unspecified, unspecified site: Secondary | ICD-10-CM | POA: Diagnosis present

## 2022-01-21 DIAGNOSIS — I482 Chronic atrial fibrillation, unspecified: Secondary | ICD-10-CM | POA: Diagnosis present

## 2022-01-21 DIAGNOSIS — U099 Post covid-19 condition, unspecified: Secondary | ICD-10-CM | POA: Diagnosis present

## 2022-01-21 DIAGNOSIS — I480 Paroxysmal atrial fibrillation: Secondary | ICD-10-CM | POA: Diagnosis not present

## 2022-01-21 DIAGNOSIS — U071 COVID-19: Secondary | ICD-10-CM

## 2022-01-21 DIAGNOSIS — Z96651 Presence of right artificial knee joint: Secondary | ICD-10-CM | POA: Diagnosis present

## 2022-01-21 DIAGNOSIS — I4891 Unspecified atrial fibrillation: Secondary | ICD-10-CM | POA: Diagnosis not present

## 2022-01-21 DIAGNOSIS — D72819 Decreased white blood cell count, unspecified: Secondary | ICD-10-CM | POA: Diagnosis present

## 2022-01-21 DIAGNOSIS — I2510 Atherosclerotic heart disease of native coronary artery without angina pectoris: Secondary | ICD-10-CM | POA: Diagnosis present

## 2022-01-21 DIAGNOSIS — I5031 Acute diastolic (congestive) heart failure: Secondary | ICD-10-CM

## 2022-01-21 DIAGNOSIS — Z888 Allergy status to other drugs, medicaments and biological substances status: Secondary | ICD-10-CM

## 2022-01-21 DIAGNOSIS — J9601 Acute respiratory failure with hypoxia: Secondary | ICD-10-CM | POA: Diagnosis present

## 2022-01-21 DIAGNOSIS — I129 Hypertensive chronic kidney disease with stage 1 through stage 4 chronic kidney disease, or unspecified chronic kidney disease: Secondary | ICD-10-CM | POA: Diagnosis present

## 2022-01-21 DIAGNOSIS — I5032 Chronic diastolic (congestive) heart failure: Secondary | ICD-10-CM | POA: Diagnosis present

## 2022-01-21 DIAGNOSIS — Z96612 Presence of left artificial shoulder joint: Secondary | ICD-10-CM | POA: Diagnosis present

## 2022-01-21 DIAGNOSIS — E039 Hypothyroidism, unspecified: Secondary | ICD-10-CM | POA: Diagnosis present

## 2022-01-21 DIAGNOSIS — I5030 Unspecified diastolic (congestive) heart failure: Secondary | ICD-10-CM | POA: Diagnosis present

## 2022-01-21 DIAGNOSIS — N179 Acute kidney failure, unspecified: Secondary | ICD-10-CM | POA: Diagnosis present

## 2022-01-21 DIAGNOSIS — I509 Heart failure, unspecified: Secondary | ICD-10-CM

## 2022-01-21 DIAGNOSIS — R053 Chronic cough: Secondary | ICD-10-CM | POA: Diagnosis present

## 2022-01-21 DIAGNOSIS — Z7901 Long term (current) use of anticoagulants: Secondary | ICD-10-CM

## 2022-01-21 DIAGNOSIS — N1831 Chronic kidney disease, stage 3a: Secondary | ICD-10-CM | POA: Diagnosis present

## 2022-01-21 DIAGNOSIS — Z79899 Other long term (current) drug therapy: Secondary | ICD-10-CM

## 2022-01-21 DIAGNOSIS — R0602 Shortness of breath: Secondary | ICD-10-CM

## 2022-01-21 DIAGNOSIS — E876 Hypokalemia: Secondary | ICD-10-CM

## 2022-01-21 DIAGNOSIS — Z807 Family history of other malignant neoplasms of lymphoid, hematopoietic and related tissues: Secondary | ICD-10-CM

## 2022-01-21 DIAGNOSIS — Z66 Do not resuscitate: Secondary | ICD-10-CM | POA: Diagnosis not present

## 2022-01-21 LAB — RESP PANEL BY RT-PCR (FLU A&B, COVID) ARPGX2
Influenza A by PCR: NEGATIVE
Influenza B by PCR: NEGATIVE
SARS Coronavirus 2 by RT PCR: POSITIVE — AB

## 2022-01-21 LAB — CBC WITH DIFFERENTIAL/PLATELET
Abs Immature Granulocytes: 0.14 10*3/uL — ABNORMAL HIGH (ref 0.00–0.07)
Basophils Absolute: 0 10*3/uL (ref 0.0–0.1)
Basophils Relative: 1 %
Eosinophils Absolute: 0.1 10*3/uL (ref 0.0–0.5)
Eosinophils Relative: 2 %
HCT: 33.2 % — ABNORMAL LOW (ref 36.0–46.0)
Hemoglobin: 11.1 g/dL — ABNORMAL LOW (ref 12.0–15.0)
Immature Granulocytes: 6 %
Lymphocytes Relative: 15 %
Lymphs Abs: 0.4 10*3/uL — ABNORMAL LOW (ref 0.7–4.0)
MCH: 31.2 pg (ref 26.0–34.0)
MCHC: 33.4 g/dL (ref 30.0–36.0)
MCV: 93.3 fL (ref 80.0–100.0)
Monocytes Absolute: 0.5 10*3/uL (ref 0.1–1.0)
Monocytes Relative: 21 %
Neutro Abs: 1.4 10*3/uL — ABNORMAL LOW (ref 1.7–7.7)
Neutrophils Relative %: 55 %
Platelets: 167 10*3/uL (ref 150–400)
RBC: 3.56 MIL/uL — ABNORMAL LOW (ref 3.87–5.11)
RDW: 14.3 % (ref 11.5–15.5)
WBC: 2.5 10*3/uL — ABNORMAL LOW (ref 4.0–10.5)
nRBC: 0 % (ref 0.0–0.2)

## 2022-01-21 LAB — MAGNESIUM: Magnesium: 1.2 mg/dL — ABNORMAL LOW (ref 1.7–2.4)

## 2022-01-21 LAB — BASIC METABOLIC PANEL
Anion gap: 5 (ref 5–15)
BUN: 15 mg/dL (ref 8–23)
CO2: 20 mmol/L — ABNORMAL LOW (ref 22–32)
Calcium: 6.1 mg/dL — CL (ref 8.9–10.3)
Chloride: 116 mmol/L — ABNORMAL HIGH (ref 98–111)
Creatinine, Ser: 0.86 mg/dL (ref 0.44–1.00)
GFR, Estimated: >60 mL/min (ref >60)
Glucose, Bld: 101 mg/dL — ABNORMAL HIGH (ref 70–99)
Potassium: 2.5 mmol/L — CL (ref 3.5–5.1)
Sodium: 141 mmol/L (ref 135–145)

## 2022-01-21 LAB — BRAIN NATRIURETIC PEPTIDE: B Natriuretic Peptide: 677.6 pg/mL — ABNORMAL HIGH (ref 0.0–100.0)

## 2022-01-21 MED ORDER — APIXABAN 5 MG PO TABS
5.0000 mg | ORAL_TABLET | Freq: Two times a day (BID) | ORAL | Status: DC
  Administered 2022-01-22 – 2022-01-24 (×6): 5 mg via ORAL
  Filled 2022-01-21 (×6): qty 1

## 2022-01-21 MED ORDER — FUROSEMIDE 10 MG/ML IJ SOLN
20.0000 mg | Freq: Once | INTRAMUSCULAR | Status: AC
  Administered 2022-01-21: 20 mg via INTRAVENOUS
  Filled 2022-01-21: qty 2

## 2022-01-21 MED ORDER — POTASSIUM CHLORIDE 10 MEQ/100ML IV SOLN
10.0000 meq | INTRAVENOUS | Status: AC
  Administered 2022-01-21 (×2): 10 meq via INTRAVENOUS
  Filled 2022-01-21 (×2): qty 100

## 2022-01-21 MED ORDER — ACETAMINOPHEN 325 MG PO TABS: 650.0000 mg | ORAL_TABLET | Freq: Four times a day (QID) | ORAL | Status: DC | PRN

## 2022-01-21 MED ORDER — DILTIAZEM HCL-DEXTROSE 125-5 MG/125ML-% IV SOLN (PREMIX)
5.0000 mg/h | INTRAVENOUS | Status: DC
  Administered 2022-01-21: 5 mg/h via INTRAVENOUS
  Filled 2022-01-21: qty 125

## 2022-01-21 MED ORDER — DILTIAZEM LOAD VIA INFUSION
10.0000 mg | Freq: Once | INTRAVENOUS | Status: AC
  Administered 2022-01-21: 10 mg via INTRAVENOUS
  Filled 2022-01-21: qty 10

## 2022-01-21 MED ORDER — CALCIUM GLUCONATE-NACL 1-0.675 GM/50ML-% IV SOLN
1.0000 g | Freq: Once | INTRAVENOUS | Status: AC
  Administered 2022-01-21: 1000 mg via INTRAVENOUS
  Filled 2022-01-21: qty 50

## 2022-01-21 MED ORDER — POLYVINYL ALCOHOL 1.4 % OP SOLN
1.0000 [drp] | Freq: Every day | OPHTHALMIC | Status: DC | PRN
  Filled 2022-01-21: qty 15

## 2022-01-21 MED ORDER — ACETAMINOPHEN 650 MG RE SUPP: 650.0000 mg | Freq: Four times a day (QID) | RECTAL | Status: DC | PRN

## 2022-01-21 MED ORDER — MOLNUPIRAVIR EUA 200MG CAPSULE
4.0000 | ORAL_CAPSULE | Freq: Two times a day (BID) | ORAL | Status: DC
  Filled 2022-01-21 (×2): qty 4

## 2022-01-21 MED ORDER — METOPROLOL SUCCINATE ER 25 MG PO TB24
25.0000 mg | ORAL_TABLET | Freq: Every day | ORAL | Status: DC
  Administered 2022-01-22 – 2022-01-23 (×2): 25 mg via ORAL
  Filled 2022-01-21 (×2): qty 1

## 2022-01-21 MED ORDER — LEVOTHYROXINE SODIUM 100 MCG PO TABS
100.0000 ug | ORAL_TABLET | Freq: Every day | ORAL | Status: DC
  Administered 2022-01-22 – 2022-01-24 (×3): 100 ug via ORAL
  Filled 2022-01-21 (×3): qty 1

## 2022-01-21 NOTE — Telephone Encounter (Signed)
Will send this information to Dr. Pemberton as an FYI. 

## 2022-01-21 NOTE — ED Provider Notes (Signed)
?[facility] ?Provider Note ? ? ?CSN: [id number] ?Arrival date & time: 01/21/22  1601 ? ?  ? ?History ? ?Chief Complaint  ?Patient presents with  ? Shortness of Breath  ? ? ?Carrie [name] is a 81 y.o. female with a history of paroxysmal A-fib, on metoprolol and Eliquis, presenting from home with shortness of breath.  Patient ports that she woke up feeling short of breath this morning.  She says she has had more dyspnea than usual.  She does not wear oxygen at home.  She said she tried to drink water and hydrate and rest but began to feel more short of breath and called EMS.  They report the patient was in A-fib with a heart rate of approximate 150 bpm.  He was also 80% on room air and placed on 2 L nasal cannula, EMS gave her 500 cc of fluid. ? ?Per my review of external records the patient just underwent left heart catheterization approximately a week ago, results posted below: ? ?  ?  Prox RCA lesion is 15% stenosed. ?  LV end diastolic pressure is normal. ?  The left ventricular ejection fraction is 50-55% by visual estimate. ?  ?1.  Mild obstructive coronary artery disease. ?2.  Normal cardiac output and index with mean right atrial pressure of 4 mmHg and wedge pressure of 11 mmHg. ?3.  Rapid atrial fibrillation. ?  ?Recommendation: Medical therapy.  If the patient's rate over the next several hours is not improved we will admit the patient for inpatient management of rapid atrial fibrillation. ? ?HPI ? ?  ? ?Home Medications ?Prior to Admission medications   ?Medication Sig Start Date End Date Taking? Authorizing Provider  ?acetaminophen (TYLENOL) 500 MG tablet Take 500 mg by mouth every 6 (six) hours as needed for moderate pain or headache.   Yes [provider]  ?amoxicillin (AMOXIL) 500 MG capsule Take 2,000 mg by mouth once. Take 30-60 minutes before dental appointment 06/30/21  Yes [provider]  ?apixaban (ELIQUIS) 5 MG TABS tablet Take 5 mg by  mouth 2 (two) times daily.    Yes [provider]  ?levothyroxine (SYNTHROID) 100 MCG tablet Take 100 mcg by mouth daily before breakfast. 06/30/21  Yes [provider]  ?lidocaine-prilocaine (EMLA) cream Apply 1 application topically daily as needed. ?Patient taking differently: Apply 1 application. topically daily as needed (For port). 04/07/21  Yes [name], MD  ?metoprolol succinate (TOPROL XL) 25 MG 24 hr tablet Take 1 tablet (25 mg total) by mouth in the morning and at bedtime. 01/11/22  Yes Weaver, Scott T, PA-C  ?polyvinyl alcohol (LIQUIFILM TEARS) 1.4 % ophthalmic solution Place 1 drop into both eyes daily as needed for dry eyes.   Yes [provider]  ?diclofenac sodium (VOLTAREN) 1 % GEL Apply 2 g topically 3 (three) times daily as needed (knee pain). ?Patient not taking: Reported on 01/21/2022 08/01/18   Quintella Reichert, MD  ?ondansetron (ZOFRAN) 8 MG tablet Take 1 tablet (8 mg total) by mouth every 8 (eight) hours as needed for refractory nausea / vomiting. Start on day 2 after bendamustine chemo. ?Patient not taking: Reported on 01/21/2022 07/23/20   [name], MD  ?oxyCODONE-acetaminophen (PERCOCET) 5-325 MG tablet Take 0.5-1 tablets by mouth every 6 (six) hours as needed for moderate pain or severe pain. ?Patient not taking: Reported on 01/21/2022 03/02/21 03/02/22  Netta Cedars, MD  ?   ? ?Allergies    ?Amiodarone   ? ?  Review of Systems   ?Review of Systems ? ?Physical Exam ?Updated Vital Signs ?BP (!) 122/96   Pulse 61   Temp 97.9 ?F (36.6 ?C) (Oral)   Resp 16   SpO2 97%  ?Physical Exam ?Constitutional:   ?   General: She is not in acute distress. ?HENT:  ?   Head: Normocephalic and atraumatic.  ?Eyes:  ?   Conjunctiva/sclera: Conjunctivae normal.  ?   Pupils: Pupils are equal, round, and reactive to light.  ?Cardiovascular:  ?   Rate and Rhythm: Tachycardia present. Rhythm irregular.  ?Pulmonary:  ?   Effort: No respiratory distress.  ?   Comments: Tachypneic, crackles on  pulm exam ?Abdominal:  ?   General: There is no distension.  ?   Tenderness: There is no abdominal tenderness.  ?Skin: ?   General: Skin is warm and dry.  ?Neurological:  ?   General: No focal deficit present.  ?   Mental Status: She is alert. Mental status is at baseline.  ?Psychiatric:     ?   Mood and Affect: Mood normal.     ?   Behavior: Behavior normal.  ? ? ?ED Results / Procedures / Treatments   ?Labs ?(all labs ordered are listed, but only abnormal results are displayed) ?Labs Reviewed  ?RESP PANEL BY RT-PCR (FLU A&B, COVID) ARPGX2 - Abnormal; Notable for the following components:  ?    Result Value  ? SARS Coronavirus 2 by RT PCR POSITIVE (*)   ? All other components within normal limits  ?CBC WITH DIFFERENTIAL/PLATELET - Abnormal; Notable for the following components:  ? WBC 2.5 (*)   ? RBC 3.56 (*)   ? Hemoglobin 11.1 (*)   ? HCT 33.2 (*)   ? Neutro Abs 1.4 (*)   ? Lymphs Abs 0.4 (*)   ? Abs Immature Granulocytes 0.14 (*)   ? All other components within normal limits  ?BRAIN NATRIURETIC PEPTIDE - Abnormal; Notable for the following components:  ? B Natriuretic Peptide 677.6 (*)   ? All other components within normal limits  ?BASIC METABOLIC PANEL - Abnormal; Notable for the following components:  ? Potassium 2.5 (*)   ? Chloride 116 (*)   ? CO2 20 (*)   ? Glucose, Bld 101 (*)   ? Calcium 6.1 (*)   ? All other components within normal limits  ?MAGNESIUM - Abnormal; Notable for the following components:  ? Magnesium 1.2 (*)   ? All other components within normal limits  ? ? ?EKG ?EKG Interpretation ? ?Date/Time:  Thursday January 21 2022 16:09:34 EDT ?Ventricular Rate:  135 ?PR Interval:    ?QRS Duration: 86 ?QT Interval:  322 ?QTC Calculation: 483 ?R Axis:   55 ?Text Interpretation: Atrial fibrillation Repolarization abnormality, prob rate related Confirmed by Octaviano Glow [phone]) on 01/21/2022 4:51:10 PM ? ?Radiology ?DG Chest Portable 1 View ? ?Result Date: 01/21/2022 ?CLINICAL DATA:  Shortness of breath  for 1 day question pulmonary edema, atrial fibrillation with heart rate of 150 in route to hospital, hypertension EXAM: PORTABLE CHEST 1 VIEW COMPARISON:  Portable exam 1647 hours compared to 12/30/2021 FINDINGS: RIGHT jugular power port with tip projecting over SVC. Enlargement of cardiac silhouette with pulmonary vascular congestion. Lungs clear. No definite pulmonary edema or segmental consolidation. No pleural effusion or pneumothorax. Prior LEFT shoulder reverse arthroplasty. IMPRESSION: Enlargement of cardiac silhouette with pulmonary vascular congestion. No acute infiltrate. Electronically Signed   By: Lavonia Dana M.D.   On: 01/21/2022 16:56   ? ?  Procedures ?Procedures  ? ? ?Medications Ordered in ED ?Medications  ?diltiazem (CARDIZEM) 1 mg/mL load via infusion 10 mg (10 mg Intravenous Bolus from Bag 01/21/22 1653)  ?  And  ?diltiazem (CARDIZEM) 125 mg in dextrose 5% 125 mL (1 mg/mL) infusion (5 mg/hr Intravenous New Bag/Given 01/21/22 1704)  ?potassium chloride 10 mEq in 100 mL IVPB (10 mEq Intravenous New Bag/Given 01/21/22 2126)  ?calcium gluconate 1 g/ 50 mL sodium chloride IVPB (has no administration in time range)  ?molnupiravir EUA (LAGEVRIO) capsule 800 mg (has no administration in time range)  ?furosemide (LASIX) injection 20 mg (20 mg Intravenous Given 01/21/22 1705)  ? ? ?ED Course/ Medical Decision Making/ A&P ?Clinical Course as of 01/21/22 2216  ?Thu Jan 21, 2022  ?1918 My reassessment the patient appears to be breathing much more comfortably.  She will be admitted for congestive heart failure exacerbation in the setting of A-fib with RVR.  Her heart rate is now controlled on diltiazem infusion around 100 bpm.  She is positive for COVID, unclear how long, but likely acute per her symptoms.  We are attempting to follow-up on her electrolyte levels, is not clear what happened to them, but she will be admitted afterwards.  I updated both the patient and her daughter on the phone [MT]  ?2011 Admitted to  hospitalist [MT]  ?  ?Clinical Course User Index ?[MT] Wyvonnia Dusky, MD  ? ?                        ?Medical Decision Making ?Amount and/or Complexity of Data Reviewed ?Labs: ordered. ?Radiology: ordered. ?

## 2022-01-21 NOTE — ED Triage Notes (Signed)
Pt arrives via EMS c/o shortness of breath x1 day, per EMS pt was in Afib at rate of 150 en route. ?Vitals en route: 108/62 ?HR 150 ?88% on room air ?Pt received 500 mL NS en route and was placed on 2L [place] ?

## 2022-01-21 NOTE — ED Notes (Signed)
MD at bedside. 

## 2022-01-21 NOTE — H&P (Addendum)
?History and Physical  ? ? ?[name] EXH:[id number] DOB: 11/15/1940 DOA: 01/21/2022 ? ?PCP: [name]  ?Patient coming from: Home. ? ?Chief Complaint: Shortness of breath. ? ?HPI: Carrie [name] is a 81 y.o. female with history of atrial fibrillation, follicular lymphoma recently admitted last month for shortness of breath and has had subsequently underwent cardiac cath on January 15, 2022 recommended medical management presents to the ER with complaints of shortness of breath over the last 24 hours.  Denies any chest pain but has been having persistent cough since her diagnosis of COVID in every 2023 2 months ago. ? ?ED Course: In the ER patient was found to be in A-fib with RVR was started on Cardizem infusion chest x-ray shows congestion and was given 1 dose of Lasix 20 mg IV.  Patient's shortness of breath improved after Lasix dose.  Patient still in A-fib with RVR admitted for further observation.  Initial labs showed severe hypokalemia and hypocalcemia which not sure if it was real be repeating the labs.  BNP was 676 high sensitive troponin was 22.  Patient does have leukopenia but afebrile.  COVID test was again positive but patient has been positive for the last 2 months. ? ?Review of Systems: As per HPI, rest all negative. ? ? ?Past Medical History:  ?Diagnosis Date  ? A-fib ([place])   ? Anemia   ? pt denies   ? Atrial fibrillation with rapid ventricular response ([place]) 06/25/2015  ? Carpal tunnel syndrome, bilateral   ? pt denies   ? Cervical spondylosis without myelopathy 10/25/2013  ? Dental bridge present   ? DJD (degenerative joint disease)   ? Dyspnea   ? sometimes hx of cancer non hodgkins lymphoma   ? Dysrhythmia   ? WENT INTO A FIB IN 2017   ? Elevated troponin 02/18/2015  ? Follicular lymphoma grade 3a ([place]) 02/03/2015  ? hx of nonhodgkins lymphoma x 3   ? Headache   ? sometimes a headache   ? History of echocardiogram   ? Echo 12/16: EF 60-65%, no RWMA, severe LAE  ? History of nuclear  stress test   ? Myoview 1/17: EF 55%, Normal study. No ischemia or scar.  ? Hypertension   ? Hypothyroid   ? Nausea without vomiting 06/20/2015  ? Stage III chronic kidney disease ([place]) 07/01/2015  ? Thrush of mouth and esophagus ([place]) 06/25/2015  ? ? ?Past Surgical History:  ?Procedure Laterality Date  ? ABDOMINAL HYSTERECTOMY    ? APPENDECTOMY    ? BACK SURGERY    ? X5-lumbar-fusion  ? COLONOSCOPY    ? DILATION AND CURETTAGE OF UTERUS    ? LYMPH NODE BIOPSY Right 01/21/2015  ? Procedure: RIGHT GROIN LYMPH NODE BIOPSY;  Surgeon: Erroll Luna, MD;  Location: [facility];  Service: General;  Laterality: Right;  ? MASS EXCISION Left 08/29/2018  ? Procedure: EXCISION LEFT BACK  MASS;  Surgeon: Erroll Luna, MD;  Location: [facility];  Service: General;  Laterality: Left;  ? Ovarian cyst resection    ? patelar tendon transplants    ? Left/right  ? PORTACATH PLACEMENT Right 02/13/2015  ? Procedure: INSERTION PORT-A-CATH WITH ULTRASOUND;  Surgeon: Erroll Luna, MD;  Location: [facility];  Service: General;  Laterality: Right;  ? PORTACATH PLACEMENT N/A 08/29/2018  ? Procedure: INSERTION PORT-A-CATH WITH ULTRA SOUND ERAS PATHWAY;  Surgeon: Erroll Luna, MD;  Location: [facility];  Service: General;  Laterality: N/A;  ? REVERSE SHOULDER ARTHROPLASTY Left  03/02/2021  ? Procedure: REVERSE SHOULDER ARTHROPLASTY;  Surgeon: Netta Cedars, MD;  Location: [facility];  Service: Orthopedics;  Laterality: Left;  ? RIGHT/LEFT HEART CATH AND CORONARY ANGIOGRAPHY N/A 01/15/2022  ? Procedure: RIGHT/LEFT HEART CATH AND CORONARY ANGIOGRAPHY;  Surgeon: [name], MD;  Location: [facility];  Service: Cardiovascular;  Laterality: N/A;  ? TOTAL KNEE ARTHROPLASTY  2011  ? Right  ? TUBAL LIGATION    ? ? ? reports that she has never smoked. She has never used smokeless tobacco. She reports that she does not currently use alcohol. She reports that she does not use drugs. ? ?Allergies  ?Allergen Reactions  ? Amiodarone Other (See  Comments)  ?  Hyperthyroidism   ? ? ?Family History  ?Problem Relation Age of Onset  ? Cancer Mother   ?     Breast, lung NHL  ? Cancer Sister   ?     Multiple myeloma  ? ? ?Prior to Admission medications   ?Medication Sig Start Date End Date Taking? Authorizing Provider  ?acetaminophen (TYLENOL) 500 MG tablet Take 500 mg by mouth every 6 (six) hours as needed for moderate pain or headache.   Yes [provider]  ?amoxicillin (AMOXIL) 500 MG capsule Take 2,000 mg by mouth once. Take 30-60 minutes before dental appointment 06/30/21  Yes [provider]  ?apixaban (ELIQUIS) 5 MG TABS tablet Take 5 mg by mouth 2 (two) times daily.    Yes [provider]  ?levothyroxine (SYNTHROID) 100 MCG tablet Take 100 mcg by mouth daily before breakfast. 06/30/21  Yes [provider]  ?lidocaine-prilocaine (EMLA) cream Apply 1 application topically daily as needed. ?Patient taking differently: Apply 1 application. topically daily as needed (For port). 04/07/21  Yes [name], MD  ?metoprolol succinate (TOPROL XL) 25 MG 24 hr tablet Take 1 tablet (25 mg total) by mouth in the morning and at bedtime. 01/11/22  Yes Weaver, Scott T, PA-C  ?polyvinyl alcohol (LIQUIFILM TEARS) 1.4 % ophthalmic solution Place 1 drop into both eyes daily as needed for dry eyes.   Yes [provider]  ?diclofenac sodium (VOLTAREN) 1 % GEL Apply 2 g topically 3 (three) times daily as needed (knee pain). ?Patient not taking: Reported on 01/21/2022 08/01/18   Quintella Reichert, MD  ?ondansetron (ZOFRAN) 8 MG tablet Take 1 tablet (8 mg total) by mouth every 8 (eight) hours as needed for refractory nausea / vomiting. Start on day 2 after bendamustine chemo. ?Patient not taking: Reported on 01/21/2022 07/23/20   [name], MD  ?oxyCODONE-acetaminophen (PERCOCET) 5-325 MG tablet Take 0.5-1 tablets by mouth every 6 (six) hours as needed for moderate pain or severe pain. ?Patient not taking: Reported on 01/21/2022 03/02/21 03/02/22   Netta Cedars, MD  ? ? ?Physical Exam: ?Constitutional: Moderately built and nourished. ?Vitals:  ? 01/21/22 1812 01/21/22 1900 01/21/22 1945 01/21/22 2215  ?BP:      ?Pulse:   61 88  ?Resp: _0 ?Temp:      ?TempSrc:      ?SpO2:  97% 97% 94%  ? ?Eyes: Anicteric no pallor. ?ENMT: No discharge from the ears eyes nose and mouth. ?Neck: No mass felt.  No neck rigidity.  JVD elevated. ?Respiratory: No rhonchi or crepitations. ?Cardiovascular: S1-S2 heard. ?Abdomen: Soft nontender bowel sounds present. ?Musculoskeletal: No edema. ?Skin: No rash. ?Neurologic: Alert awake oriented to time place and person.  Moves all extremities. ?Psychiatric: Appears normal.  Normal affect. ? ? ?Labs  on Admission: I have personally reviewed following labs and imaging studies ? ?CBC: ?Recent Labs  ?Lab 01/15/22 ?1100 01/15/22 ?1101 01/15/22 ?1108 01/15/22 ?1114 01/21/22 ?1650  ?WBC  --   --   --   --  2.5*  ?NEUTROABS  --   --   --   --  1.4*  ?HGB 11.9* 11.9* 10.9* 10.9* 11.1*  ?HCT 35.0* 35.0* 32.0* 32.0* 33.2*  ?MCV  --   --   --   --  93.3  ?PLT  --   --   --   --  167  ? ?Basic Metabolic Panel: ?Recent Labs  ?Lab 01/15/22 ?1100 01/15/22 ?1101 01/15/22 ?1108 01/15/22 ?1114 01/21/22 ?1926  ?NA 141 141 142 140 141  ?K 3.7 3.8 3.5 3.7 2.5*  ?CL  --   --   --   --  116*  ?CO2  --   --   --   --  20*  ?GLUCOSE  --   --   --   --  101*  ?BUN  --   --   --   --  15  ?CREATININE  --   --   --   --  0.86  ?CALCIUM  --   --   --   --  6.1*  ?MG  --   --   --   --  1.2*  ? ?GFR: ?Estimated Creatinine Clearance: 44.5 mL/min (by C-G formula based on SCr of 0.86 mg/dL). ?Liver Function Tests: ?No results for input(s): AST, ALT, ALKPHOS, BILITOT, PROT, ALBUMIN in the last 168 hours. ?No results for input(s): LIPASE, AMYLASE in the last 168 hours. ?No results for input(s): AMMONIA in the last 168 hours. ?Coagulation Profile: ?No results for input(s): INR, PROTIME in the last 168 hours. ?Cardiac Enzymes: ?No results for input(s): CKTOTAL, CKMB,  CKMBINDEX, TROPONINI in the last 168 hours. ?BNP (last 3 results) ?No results for input(s): PROBNP in the last 8760 hours. ?HbA1C: ?No results for input(s): HGBA1C in the last 72 hours. ?CBG: ?No results for inp

## 2022-01-21 NOTE — Telephone Encounter (Signed)
Patient's daughter called and said that patient is on the way to ER by ambulance. Was told if anything was to happen to go to ER and contact cardiologist ?

## 2022-01-22 ENCOUNTER — Ambulatory Visit (HOSPITAL_COMMUNITY): Payer: Medicare HMO | Admitting: Physician Assistant

## 2022-01-22 DIAGNOSIS — U099 Post covid-19 condition, unspecified: Secondary | ICD-10-CM | POA: Diagnosis present

## 2022-01-22 DIAGNOSIS — R053 Chronic cough: Secondary | ICD-10-CM | POA: Diagnosis present

## 2022-01-22 DIAGNOSIS — I482 Chronic atrial fibrillation, unspecified: Secondary | ICD-10-CM | POA: Diagnosis present

## 2022-01-22 DIAGNOSIS — I2510 Atherosclerotic heart disease of native coronary artery without angina pectoris: Secondary | ICD-10-CM | POA: Diagnosis present

## 2022-01-22 DIAGNOSIS — D72819 Decreased white blood cell count, unspecified: Secondary | ICD-10-CM | POA: Diagnosis present

## 2022-01-22 DIAGNOSIS — E876 Hypokalemia: Secondary | ICD-10-CM | POA: Diagnosis present

## 2022-01-22 DIAGNOSIS — C8290 Follicular lymphoma, unspecified, unspecified site: Secondary | ICD-10-CM | POA: Diagnosis present

## 2022-01-22 DIAGNOSIS — N1831 Chronic kidney disease, stage 3a: Secondary | ICD-10-CM | POA: Diagnosis present

## 2022-01-22 DIAGNOSIS — Z66 Do not resuscitate: Secondary | ICD-10-CM | POA: Diagnosis not present

## 2022-01-22 DIAGNOSIS — I129 Hypertensive chronic kidney disease with stage 1 through stage 4 chronic kidney disease, or unspecified chronic kidney disease: Secondary | ICD-10-CM | POA: Diagnosis present

## 2022-01-22 DIAGNOSIS — Z96651 Presence of right artificial knee joint: Secondary | ICD-10-CM | POA: Diagnosis present

## 2022-01-22 DIAGNOSIS — I480 Paroxysmal atrial fibrillation: Secondary | ICD-10-CM | POA: Diagnosis present

## 2022-01-22 DIAGNOSIS — Z807 Family history of other malignant neoplasms of lymphoid, hematopoietic and related tissues: Secondary | ICD-10-CM | POA: Diagnosis not present

## 2022-01-22 DIAGNOSIS — Z7901 Long term (current) use of anticoagulants: Secondary | ICD-10-CM | POA: Diagnosis not present

## 2022-01-22 DIAGNOSIS — I4891 Unspecified atrial fibrillation: Secondary | ICD-10-CM | POA: Diagnosis present

## 2022-01-22 DIAGNOSIS — Z79899 Other long term (current) drug therapy: Secondary | ICD-10-CM | POA: Diagnosis not present

## 2022-01-22 DIAGNOSIS — U071 COVID-19: Secondary | ICD-10-CM | POA: Diagnosis present

## 2022-01-22 DIAGNOSIS — E039 Hypothyroidism, unspecified: Secondary | ICD-10-CM | POA: Diagnosis present

## 2022-01-22 DIAGNOSIS — Z888 Allergy status to other drugs, medicaments and biological substances status: Secondary | ICD-10-CM | POA: Diagnosis not present

## 2022-01-22 DIAGNOSIS — N179 Acute kidney failure, unspecified: Secondary | ICD-10-CM | POA: Diagnosis present

## 2022-01-22 DIAGNOSIS — Z96612 Presence of left artificial shoulder joint: Secondary | ICD-10-CM | POA: Diagnosis present

## 2022-01-22 DIAGNOSIS — J9601 Acute respiratory failure with hypoxia: Secondary | ICD-10-CM | POA: Diagnosis present

## 2022-01-22 LAB — BASIC METABOLIC PANEL
Anion gap: 7 (ref 5–15)
Anion gap: 7 (ref 5–15)
BUN: 20 mg/dL (ref 8–23)
BUN: 21 mg/dL (ref 8–23)
CO2: 26 mmol/L (ref 22–32)
CO2: 26 mmol/L (ref 22–32)
Calcium: 8.4 mg/dL — ABNORMAL LOW (ref 8.9–10.3)
Calcium: 8.9 mg/dL (ref 8.9–10.3)
Chloride: 103 mmol/L (ref 98–111)
Chloride: 105 mmol/L (ref 98–111)
Creatinine, Ser: 1.32 mg/dL — ABNORMAL HIGH (ref 0.44–1.00)
Creatinine, Ser: 1.37 mg/dL — ABNORMAL HIGH (ref 0.44–1.00)
GFR, Estimated: 39 mL/min — ABNORMAL LOW (ref >60)
GFR, Estimated: 41 mL/min — ABNORMAL LOW (ref >60)
Glucose, Bld: 132 mg/dL — ABNORMAL HIGH (ref 70–99)
Glucose, Bld: 158 mg/dL — ABNORMAL HIGH (ref 70–99)
Potassium: 3.6 mmol/L (ref 3.5–5.1)
Potassium: 4.1 mmol/L (ref 3.5–5.1)
Sodium: 136 mmol/L (ref 135–145)
Sodium: 138 mmol/L (ref 135–145)

## 2022-01-22 LAB — CBC
HCT: 38.2 % (ref 36.0–46.0)
Hemoglobin: 12.9 g/dL (ref 12.0–15.0)
MCH: 31.5 pg (ref 26.0–34.0)
MCHC: 33.8 g/dL (ref 30.0–36.0)
MCV: 93.2 fL (ref 80.0–100.0)
Platelets: 166 10*3/uL (ref 150–400)
RBC: 4.1 MIL/uL (ref 3.87–5.11)
RDW: 14.5 % (ref 11.5–15.5)
WBC: 2.9 10*3/uL — ABNORMAL LOW (ref 4.0–10.5)
nRBC: 0 % (ref 0.0–0.2)

## 2022-01-22 LAB — URINALYSIS, COMPLETE (UACMP) WITH MICROSCOPIC
Bilirubin Urine: NEGATIVE
Glucose, UA: NEGATIVE mg/dL
Hgb urine dipstick: NEGATIVE
Ketones, ur: NEGATIVE mg/dL
Nitrite: NEGATIVE
Protein, ur: NEGATIVE mg/dL
Specific Gravity, Urine: 1.012 (ref 1.005–1.030)
pH: 6 (ref 5.0–8.0)

## 2022-01-22 LAB — HEPATIC FUNCTION PANEL
ALT: 22 U/L (ref 0–44)
AST: 22 U/L (ref 15–41)
Albumin: 3.5 g/dL (ref 3.5–5.0)
Alkaline Phosphatase: 72 U/L (ref 38–126)
Bilirubin, Direct: 0.1 mg/dL (ref 0.0–0.2)
Indirect Bilirubin: 0.5 mg/dL (ref 0.3–0.9)
Total Bilirubin: 0.6 mg/dL (ref 0.3–1.2)
Total Protein: 5.6 g/dL — ABNORMAL LOW (ref 6.5–8.1)

## 2022-01-22 LAB — TROPONIN I (HIGH SENSITIVITY)
Troponin I (High Sensitivity): 22 ng/L — ABNORMAL HIGH (ref <18)
Troponin I (High Sensitivity): 23 ng/L — ABNORMAL HIGH (ref <18)

## 2022-01-22 LAB — MAGNESIUM: Magnesium: 1.7 mg/dL (ref 1.7–2.4)

## 2022-01-22 LAB — NA AND K (SODIUM & POTASSIUM), RAND UR
Potassium Urine: 42 mmol/L
Sodium, Ur: 59 mmol/L

## 2022-01-22 LAB — CREATININE, URINE, RANDOM: Creatinine, Urine: 74.88 mg/dL

## 2022-01-22 LAB — TSH: TSH: 2.445 u[IU]/mL (ref 0.350–4.500)

## 2022-01-22 MED ORDER — DILTIAZEM HCL ER COATED BEADS 120 MG PO CP24
120.0000 mg | ORAL_CAPSULE | Freq: Every day | ORAL | Status: DC
  Administered 2022-01-22: 120 mg via ORAL
  Filled 2022-01-22: qty 1

## 2022-01-22 MED ORDER — POTASSIUM CHLORIDE CRYS ER 20 MEQ PO TBCR
40.0000 meq | EXTENDED_RELEASE_TABLET | Freq: Once | ORAL | Status: AC
  Administered 2022-01-22: 40 meq via ORAL
  Filled 2022-01-22: qty 2

## 2022-01-22 MED ORDER — SODIUM CHLORIDE 0.9 % IV SOLN: INTRAVENOUS | Status: DC

## 2022-01-22 MED ORDER — MAGNESIUM SULFATE 4 GM/100ML IV SOLN
4.0000 g | Freq: Once | INTRAVENOUS | Status: AC
  Administered 2022-01-22: 4 g via INTRAVENOUS
  Filled 2022-01-22: qty 100

## 2022-01-22 MED ORDER — SODIUM CHLORIDE 0.9 % IV BOLUS: 500.0000 mL | Freq: Once | INTRAVENOUS | Status: DC

## 2022-01-22 NOTE — Progress Notes (Signed)
Heart Failure Navigator Progress Note ? ?Following this hospitalization to assess for HV TOC readiness.  ? ?EF 50-55? ?A-fib? Covid ? ?Earnestine Leys, BSN, RN ?Heart Failure Nurse Navigator ?Secure Chat Only ?

## 2022-01-22 NOTE — Progress Notes (Signed)
Infection Prevention  ?Unit: 6E ?Regarding:Covid status of patient ?IP Recommendation: Carrie [name] is receiving Cancer treatments and is leukopenic. Understood that she had Covid in Feb, but she will need to be on Airborne/Contact Precautions as she is immunocompromised. I called the lab and obtained a CT value on her Covid Lab and it is 25 which is considered infectious. Advised via Secure chat to attending and bedside RN. Phoned Surveyor, minerals and advised.  ?

## 2022-01-22 NOTE — Hospital Course (Signed)
Carrie [name] is a 81 y.o. female with history of atrial fibrillation, follicular lymphoma recently admitted last month for shortness of breath and has had subsequently underwent cardiac cath on January 15, 2022 recommended medical management presents to the ER with complaints of shortness of breath over the last 24 hours. ?Found to have A-fib with RVR on Cardizem infusion. ?

## 2022-01-22 NOTE — Progress Notes (Signed)
?  Progress Note ?Patient: Carrie [name]:[id number] DOB: 07-02-1941 DOA: 01/21/2022  ?DOS: the patient was seen and examined on 01/22/2022 ? ?Brief hospital course: ?Carrie [name] is a 81 y.o. female with history of atrial fibrillation, follicular lymphoma recently admitted last month for shortness of breath and has had subsequently underwent cardiac cath on January 15, 2022 recommended medical management presents to the ER with complaints of shortness of breath over the last 24 hours. ?Found to have A-fib with RVR on Cardizem infusion. ? ?Assessment and Plan: ?Paroxysmal A-fib with RVR. ?Started on Cardizem infusion. ?TSH normal. ?Currently rate controlled but not in sinus. ?Continue with Toprol and transition IV Cardizem to oral. ?Patient is supposed to follow-up with A-fib clinic in the outpatient arena. ?Continue anticoagulation ? ?Suspected acute diastolic CHF. ?Volume is appears adequate on my examination. ?Will monitor. ? ?COVID-positive. ?Recently positive in February 2023. ?For infection prevention recommend continuing isolation given her immunocompromise status. ? ?Follicular lymphoma. ?Leukopenia ?On rituximab. ?Monitor. ? ?Hypothyroidism. ?TSH normal. ?Continue Synthroid. ? ?Acute kidney injury on chronic kidney disease stage IIIa ?We will provide IV hydration and monitor response. ?Concerning the patient recently had hypotensive episodes as well as recently underwent cardiac catheterization both which can cause nephro toxicity.  Monitor. ? ?Subjective: Feeling better with no nausea no vomiting.  Still has fatigue.  No diarrhea no constipation. ? ?Physical Exam: ?Vitals:  ? 01/22/22 0815 01/22/22 1129 01/22/22 1417 01/22/22 1738  ?BP: 126/81 118/83 106/72 115/74  ?Pulse: 99 60  92  ?Resp:  14  18  ?Temp:  97.7 ?F (36.5 ?C)  97.7 ?F (36.5 ?C)  ?TempSrc:  Oral  Oral  ?SpO2:  98%  100%  ?Weight:      ?Height:      ? ?General: Appear in mild distress; no visible Abnormal Neck Mass Or lumps, Conjunctiva  normal ?Cardiovascular: S1 and S2 Present, aortic systolic  Murmur, ?Respiratory: good respiratory effort, Bilateral Air entry present and CTA, no Crackles, no wheezes ?Abdomen: Bowel Sound present, Non tender  ?Extremities: no Pedal edema ?Neurology: alert and oriented to time, place, and person ?Gait not checked due to patient safety concerns  ? ?Data Reviewed: ?I have Reviewed nursing notes, Vitals, and Lab results since pt's last encounter. Pertinent lab results CBC and BMP ?I have ordered test including CBC and BMP   ? ?Family Communication: None at bedside ? ?Disposition: ?Status is: Inpatient ?Remains inpatient appropriate because: Needing IV Cardizem and close observation of transition to p.o.  Also needing IV fluids in the setting of AKI. ? ?Author: ?Berle Mull, MD ?01/22/2022 7:18 PM ? ?For on call review [email or web address] ?

## 2022-01-23 DIAGNOSIS — N179 Acute kidney failure, unspecified: Secondary | ICD-10-CM | POA: Diagnosis present

## 2022-01-23 DIAGNOSIS — I4891 Unspecified atrial fibrillation: Secondary | ICD-10-CM | POA: Diagnosis not present

## 2022-01-23 DIAGNOSIS — N1831 Chronic kidney disease, stage 3a: Secondary | ICD-10-CM | POA: Diagnosis present

## 2022-01-23 LAB — BASIC METABOLIC PANEL
Anion gap: 8 (ref 5–15)
BUN: 18 mg/dL (ref 8–23)
CO2: 24 mmol/L (ref 22–32)
Calcium: 8.5 mg/dL — ABNORMAL LOW (ref 8.9–10.3)
Chloride: 105 mmol/L (ref 98–111)
Creatinine, Ser: 1.23 mg/dL — ABNORMAL HIGH (ref 0.44–1.00)
GFR, Estimated: 45 mL/min — ABNORMAL LOW (ref >60)
Glucose, Bld: 110 mg/dL — ABNORMAL HIGH (ref 70–99)
Potassium: 4.6 mmol/L (ref 3.5–5.1)
Sodium: 137 mmol/L (ref 135–145)

## 2022-01-23 LAB — CBC
HCT: 39.4 % (ref 36.0–46.0)
Hemoglobin: 12.6 g/dL (ref 12.0–15.0)
MCH: 30.1 pg (ref 26.0–34.0)
MCHC: 32 g/dL (ref 30.0–36.0)
MCV: 94 fL (ref 80.0–100.0)
Platelets: 167 10*3/uL (ref 150–400)
RBC: 4.19 MIL/uL (ref 3.87–5.11)
RDW: 14.3 % (ref 11.5–15.5)
WBC: 2.4 10*3/uL — ABNORMAL LOW (ref 4.0–10.5)
nRBC: 0 % (ref 0.0–0.2)

## 2022-01-23 LAB — MAGNESIUM: Magnesium: 2.3 mg/dL (ref 1.7–2.4)

## 2022-01-23 MED ORDER — AMIODARONE HCL IN DEXTROSE 360-4.14 MG/200ML-% IV SOLN
30.0000 mg/h | INTRAVENOUS | Status: DC
  Administered 2022-01-23 – 2022-01-24 (×2): 30 mg/h via INTRAVENOUS
  Filled 2022-01-23: qty 200

## 2022-01-23 MED ORDER — DILTIAZEM HCL ER COATED BEADS 180 MG PO CP24
180.0000 mg | ORAL_CAPSULE | Freq: Every day | ORAL | Status: DC
  Filled 2022-01-23: qty 1

## 2022-01-23 MED ORDER — METOPROLOL SUCCINATE ER 25 MG PO TB24
25.0000 mg | ORAL_TABLET | Freq: Once | ORAL | Status: AC
Start: 2022-01-23 — End: 2022-01-23
  Administered 2022-01-23: 25 mg via ORAL
  Filled 2022-01-23: qty 1

## 2022-01-23 MED ORDER — DILTIAZEM HCL ER COATED BEADS 120 MG PO CP24
120.0000 mg | ORAL_CAPSULE | Freq: Every day | ORAL | Status: DC
  Administered 2022-01-23: 120 mg via ORAL
  Filled 2022-01-23: qty 1

## 2022-01-23 MED ORDER — AMIODARONE HCL IN DEXTROSE 360-4.14 MG/200ML-% IV SOLN
60.0000 mg/h | INTRAVENOUS | Status: DC
  Administered 2022-01-23: 60 mg/h via INTRAVENOUS
  Filled 2022-01-23 (×2): qty 200

## 2022-01-23 MED ORDER — METOPROLOL SUCCINATE ER 50 MG PO TB24: 50.0000 mg | ORAL_TABLET | Freq: Every day | ORAL | Status: DC

## 2022-01-23 MED ORDER — METOPROLOL SUCCINATE ER 25 MG PO TB24: 25.0000 mg | ORAL_TABLET | Freq: Every day | ORAL | Status: DC

## 2022-01-23 NOTE — Progress Notes (Signed)
?  Progress Note ?Patient: Carrie [name] SEG:[id number] DOB: 1941/04/24 DOA: 01/21/2022  ?DOS: the patient was seen and examined on 01/23/2022 ? ?Brief hospital course: ?Kaina TAMY ACCARDO is a 81 y.o. female with history of atrial fibrillation, follicular lymphoma recently admitted last month for shortness of breath and has had subsequently underwent cardiac cath on January 15, 2022 recommended medical management presents to the ER with complaints of shortness of breath over the last 24 hours. ?Found to have A-fib with RVR on Cardizem infusion. ? ?Assessment and Plan: ?Paroxysmal A-fib with RVR. ?Started on Cardizem infusion. ?TSH normal. ?Currently rate controlled but not in sinus. ?Continue with Toprol.  ?Patient significantly positive with orthostatic drop in the blood pressure from 737 to 80 systolic. ?Discussed with cardiology, Dr. Admission.  Recommend to discontinue Cardizem, give the patient IV fluid and initiate on IV amiodarone. ?Patient does have history of reported hyperthyroidism in the chart although I do not see supportive documentation in cardiology follow-up as a reason for discontinuation of the amiodarone in 2020. ?Therefore I feel comfortable resuming this medication.  Monitor and transition to p.o. ?Continue anticoagulation ?Outpatient follow-up with A-fib clinic ?  ?Suspected acute diastolic CHF. ?Volume is appears adequate on my examination. ?Will monitor. ?  ?COVID-positive. ?Recently positive in February 2023. ?For infection prevention recommend continuing isolation given her immunocompromise status. ? ?Follicular lymphoma. ?Leukopenia ?On rituximab. ?Monitor. ?  ?Hypothyroidism. ?TSH normal. ?Continue Synthroid. ?  ?Acute kidney injury on chronic kidney disease stage IIIa ?We will provide IV hydration and monitor response. ?FeNa 0.8. ?Concerning the patient recently had hypotensive episodes as well as recently underwent cardiac catheterization both which can cause nephro toxicity.  Monitor. ? ?Acute  hypoxic respiratory failure. ?Currently on 2 L of oxygen.  Will provide incentive spirometer. ? ?Subjective: Denies any acute complaint.  No dizziness or lightheadedness.  No nausea no vomiting.  No diarrhea.  No bleeding.  Unable to take large pills. ? ?Physical Exam: ?Vitals:  ? 01/23/22 1600 01/23/22 1800 01/23/22 1824 01/23/22 2015  ?BP:   104/73 109/73  ?Pulse:    80  ?Resp: '16 17 18 17  '$ ?Temp:   98.1 ?F (36.7 ?C) 97.6 ?F (36.4 ?C)  ?TempSrc:   Oral Oral  ?SpO2:    98%  ?Weight:      ?Height:      ? ?General: Appear in mild distress; no visible Abnormal Neck Mass Or lumps, Conjunctiva normal ?Cardiovascular: S1 and S2 Present, no Murmur, ?Respiratory: good respiratory effort, Bilateral Air entry present and CTA, no Crackles, no wheezes ?Abdomen: Bowel Sound present, Non tender ?Extremities: no Pedal edema ?Neurology: alert and oriented to time, place, and person ?Gait not checked due to patient safety concerns  ? ?Data Reviewed: ?I have Reviewed nursing notes, Vitals, and Lab results since pt's last encounter. Pertinent lab results CBC and CMP ?I have ordered test including CBC and BMP ?I have discussed pt's care plan and test results with cardiology.  ? ?Family Communication: Discussed with daughter on the phone ? ?Disposition: ?Status is: Inpatient ?Remains inpatient appropriate because: Requiring IV therapy. ?Author: ?Berle Mull, MD ?01/23/2022 8:39 PM ? ?For on call review [email or web address] ?

## 2022-01-23 NOTE — Progress Notes (Signed)
HOSPITAL MEDICINE OVERNIGHT EVENT NOTE   ? ?Nursing reports that patient requests to be DNR.  Patient is awake alert and oriented x3.  Patient is in possession of her signed DNR form. ? ?Order placed for patient to be transitioned to DNR status. ? ?[name]  MD ?[facility]  ? ? ? ? ? ? ? ? ? ? ?

## 2022-01-23 NOTE — Progress Notes (Signed)
Pt had 26 bts VT on monitor. Asymptomatic. BP = 154/110, MAP = 124. Now back in paced rhythm. Almyra Deforest, PA notified.  ?

## 2022-01-23 NOTE — Assessment & Plan Note (Signed)
FeNa 0.8 ?

## 2022-01-23 NOTE — Progress Notes (Addendum)
Pt has new order for Amio gtt. Verified with RPH Amio concentration okay to run with NS @ 100 ml/hr via y-site.  ?

## 2022-01-23 NOTE — Progress Notes (Signed)
?  Amiodarone Drug - Drug Interaction Consult Note ? ?Recommendations: ?-Monitor for bradycardia while on metoprolol and diltiazem ?-Amiodarone can decrease the effectiveness of levothyroxine. Consider monitoring TSH and adjust regimen as clinically necessary.  ? ?Amiodarone is metabolized by the cytochrome P450 system and therefore has the potential to cause many drug interactions. Amiodarone has an average plasma half-life of 50 days (range 20 to 100 days).  ? ?There is potential for drug interactions to occur several weeks or months after stopping treatment and the onset of drug interactions may be slow after initiating amiodarone.  ? ?'[]'$  Statins: Increased risk of myopathy. Simvastatin- restrict dose to '20mg'$  daily. ?Other statins: counsel patients to report any muscle pain or weakness immediately. ? ?'[]'$  Anticoagulants: Amiodarone can increase anticoagulant effect. Consider warfarin dose reduction. Patients should be monitored closely and the dose of anticoagulant altered accordingly, remembering that amiodarone levels take several weeks to stabilize. ? ?'[]'$  Antiepileptics: Amiodarone can increase plasma concentration of phenytoin, the dose should be reduced. Note that small changes in phenytoin dose can result in large changes in levels. Monitor patient and counsel on signs of toxicity. ? ?'[x]'$  Beta blockers: increased risk of bradycardia, AV block and myocardial depression. Sotalol - avoid concomitant use. ? ?'[x]'$   Calcium channel blockers (diltiazem and verapamil): increased risk of bradycardia, AV block and myocardial depression. ? ?'[]'$   Cyclosporine: Amiodarone increases levels of cyclosporine. Reduced dose of cyclosporine is recommended. ? ?'[]'$  Digoxin dose should be halved when amiodarone is started. ? ?'[]'$  Diuretics: increased risk of cardiotoxicity if hypokalemia occurs. ? ?'[]'$  Oral hypoglycemic agents (glyburide, glipizide, glimepiride): increased risk of hypoglycemia. Patient's glucose levels should be  monitored closely when initiating amiodarone therapy.  ? ?'[]'$  Drugs that prolong the QT interval:  Torsades de pointes risk may be increased with concurrent use - avoid if possible.  Monitor QTc, also keep magnesium/potassium WNL if concurrent therapy can't be avoided. ? Antibiotics: e.g. fluoroquinolones, erythromycin. ? Antiarrhythmics: e.g. quinidine, procainamide, disopyramide, sotalol. ? Antipsychotics: e.g. phenothiazines, haloperidol. ?  Lithium, tricyclic antidepressants, and methadone. ?Thank You,  ? ? ? ?Joseph Art, Pharm.D. ?PGY-1 Pharmacy Resident ?[phone] ?01/23/2022 1:44 PM ? ?

## 2022-01-23 NOTE — Plan of Care (Signed)
?  Problem: Education: ?Goal: Knowledge of General Education information will improve ?Description: Including pain rating scale, medication(s)/side effects and non-pharmacologic comfort measures ?Outcome: Progressing ?  ?Problem: Health Behavior/Discharge Planning: ?Goal: Ability to manage health-related needs will improve ?Outcome: Progressing ?  ?Problem: Clinical Measurements: ?Goal: Ability to maintain clinical measurements within normal limits will improve ?Outcome: Completed/Met ?  ?Problem: Clinical Measurements: ?Goal: Ability to maintain clinical measurements within normal limits will improve ?Outcome: Completed/Met ?Goal: Will remain free from infection ?Outcome: Completed/Met ?Goal: Diagnostic test results will improve ?Outcome: Completed/Met ?  ?

## 2022-01-24 DIAGNOSIS — I4891 Unspecified atrial fibrillation: Secondary | ICD-10-CM | POA: Diagnosis not present

## 2022-01-24 LAB — BASIC METABOLIC PANEL
Anion gap: 7 (ref 5–15)
BUN: 21 mg/dL (ref 8–23)
CO2: 22 mmol/L (ref 22–32)
Calcium: 7.9 mg/dL — ABNORMAL LOW (ref 8.9–10.3)
Chloride: 107 mmol/L (ref 98–111)
Creatinine, Ser: 1.17 mg/dL — ABNORMAL HIGH (ref 0.44–1.00)
GFR, Estimated: 47 mL/min — ABNORMAL LOW (ref >60)
Glucose, Bld: 135 mg/dL — ABNORMAL HIGH (ref 70–99)
Potassium: 3.5 mmol/L (ref 3.5–5.1)
Sodium: 136 mmol/L (ref 135–145)

## 2022-01-24 LAB — CBC
HCT: 36 % (ref 36.0–46.0)
Hemoglobin: 11.7 g/dL — ABNORMAL LOW (ref 12.0–15.0)
MCH: 30.2 pg (ref 26.0–34.0)
MCHC: 32.5 g/dL (ref 30.0–36.0)
MCV: 93 fL (ref 80.0–100.0)
Platelets: 153 10*3/uL (ref 150–400)
RBC: 3.87 MIL/uL (ref 3.87–5.11)
RDW: 14.3 % (ref 11.5–15.5)
WBC: 2.5 10*3/uL — ABNORMAL LOW (ref 4.0–10.5)
nRBC: 0 % (ref 0.0–0.2)

## 2022-01-24 LAB — MAGNESIUM: Magnesium: 1.8 mg/dL (ref 1.7–2.4)

## 2022-01-24 MED ORDER — AMIODARONE HCL 200 MG PO TABS: ORAL_TABLET | ORAL | 0 refills | Status: DC

## 2022-01-24 MED ORDER — MAGNESIUM SULFATE 2 GM/50ML IV SOLN
2.0000 g | Freq: Once | INTRAVENOUS | Status: DC
Start: 2022-01-24 — End: 2022-01-24

## 2022-01-24 MED ORDER — AMIODARONE HCL 200 MG PO TABS
400.0000 mg | ORAL_TABLET | Freq: Two times a day (BID) | ORAL | Status: DC
  Administered 2022-01-24: 400 mg via ORAL
  Filled 2022-01-24: qty 2

## 2022-01-24 MED ORDER — LEVOTHYROXINE SODIUM 88 MCG PO TABS
88.0000 ug | ORAL_TABLET | Freq: Every day | ORAL | 0 refills | Status: AC
Start: 2022-01-24 — End: ?

## 2022-01-24 MED ORDER — MAGNESIUM CHLORIDE 64 MG PO TBEC
1.0000 | DELAYED_RELEASE_TABLET | Freq: Once | ORAL | Status: AC
  Administered 2022-01-24: 64 mg via ORAL
  Filled 2022-01-24: qty 1

## 2022-01-24 MED ORDER — POTASSIUM CHLORIDE CRYS ER 20 MEQ PO TBCR
40.0000 meq | EXTENDED_RELEASE_TABLET | Freq: Once | ORAL | Status: AC
  Administered 2022-01-24: 40 meq via ORAL
  Filled 2022-01-24: qty 2

## 2022-01-24 NOTE — Progress Notes (Signed)
Pt now SB on telemetry with HR mid 50's. Asymptomatic. Getting EKG. Dr Posey Pronto notified. Order received to stop Metoprolol.  ?

## 2022-01-24 NOTE — Plan of Care (Signed)
?  Problem: Education: ?Goal: Knowledge of General Education information will improve ?Description: Including pain rating scale, medication(s)/side effects and non-pharmacologic comfort measures ?Outcome: Adequate for Discharge ?  ?Problem: Health Behavior/Discharge Planning: ?Goal: Ability to manage health-related needs will improve ?Outcome: Adequate for Discharge ?  ?Problem: Clinical Measurements: ?Goal: Respiratory complications will improve ?Outcome: Adequate for Discharge ?Goal: Cardiovascular complication will be avoided ?Outcome: Adequate for Discharge ?  ?Problem: Activity: ?Goal: Risk for activity intolerance will decrease ?Outcome: Adequate for Discharge ?  ?Problem: Nutrition: ?Goal: Adequate nutrition will be maintained ?Outcome: Adequate for Discharge ?  ?Problem: Coping: ?Goal: Level of anxiety will decrease ?Outcome: Adequate for Discharge ?  ?Problem: Elimination: ?Goal: Will not experience complications related to bowel motility ?Outcome: Adequate for Discharge ?Goal: Will not experience complications related to urinary retention ?Outcome: Adequate for Discharge ?  ?Problem: Pain Managment: ?Goal: General experience of comfort will improve ?Outcome: Adequate for Discharge ?  ?Problem: Safety: ?Goal: Ability to remain free from injury will improve ?Outcome: Adequate for Discharge ?  ?Problem: Skin Integrity: ?Goal: Risk for impaired skin integrity will decrease ?Outcome: Adequate for Discharge ?  ?

## 2022-01-27 NOTE — Discharge Summary (Signed)
?Physician Discharge Summary ?  ?Patient: Carrie [name] MRN: [id number] DOB: 02-Oct-1941  ?Admit date:     01/21/2022  ?Discharge date: 01/24/2022  ?Discharge Physician: Berle Mull  ?PCP: [name] ? ?Recommendations at discharge: ?Follow-up with PCP and cardiology as recommended. ? ?Discharge Diagnoses: ?Principal Problem: ?  Atrial fibrillation with RVR ([place]) ?Active Problems: ?  Essential hypertension ?  (HFimpEF) heart failure with improved ejection fraction ([place]) ?  Hypothyroidism ?  Atrial fibrillation ([place]) ?  Chronic atrial fibrillation with RVR ([facility]) ?  Acute renal failure superimposed on stage 3a chronic kidney disease ([place]) ? ?Hospital Course: ?Carrie [name] is a 81 y.o. female with history of atrial fibrillation, follicular lymphoma recently admitted last month for shortness of breath and has had subsequently underwent cardiac cath on January 15, 2022 recommended medical management presents to the ER with complaints of shortness of breath over the last 24 hours. ?Found to have A-fib with RVR on Cardizem infusion. ? ?Assessment and Plan: ?Paroxysmal A-fib with RVR. ?Started on Cardizem infusion. ?TSH normal. ?Currently rate controlled but not in sinus. ?Patient significantly positive with orthostatic drop in the blood pressure from 263 to 80 systolic.  Orthostatics improved after holding Cardizem and Toprol-XL. ?Discussed with cardiology, Dr. Johnsie Cancel. ?Recommend to discontinue Cardizem, give the patient IV fluid and initiate on IV amiodarone. ?Patient does have history of reported hyperthyroidism as a side effect of amiodarone, in the chart, although I do not see supportive documentation in cardiology follow-up as a reason for discontinuation of the amiodarone in 2020. ?TSH was normal and free T4 was only minimally elevated. ?Continue anticoagulation ?Outpatient follow-up with A-fib clinic ?  ?Suspected acute diastolic CHF.  Ruled out ?Volume is appears adequate on my examination. ?Patient  actually received IV fluids. ?  ?COVID-positive. ?Recently positive in February 2023. ?For infection prevention recommend continuing isolation given her immunocompromise status. ? ?Follicular lymphoma. ?Leukopenia ?On rituximab. ?Monitor. ?  ?Hypothyroidism. ?TSH normal. ?Continue Synthroid. ?  ?Acute kidney injury on chronic kidney disease stage IIIa ?FeNa 0.8.  Suspecting prerenal etiology.  Treated with IV fluids. ?  ?Acute hypoxic respiratory failure. ?Chest x-ray showed vascular congestion.  Treated with 2 L of oxygen on admission for minimal hypoxia. ?Treated incentive spirometer with oxygenation improvement to room air on exertion at discharge. ? ?Consultants: None ?Procedures performed:  ?None ?DISCHARGE MEDICATION: ?Allergies as of 01/24/2022   ? ?   Reactions  ? Amiodarone Other (See Comments)  ? Hyperthyroidism   ? ?  ? ?  ?Medication List  ?  ? ?STOP taking these medications   ? ?diclofenac sodium 1 % Gel ?Commonly known as: VOLTAREN ?  ?metoprolol succinate 25 MG 24 hr tablet ?Commonly known as: Toprol XL ?  ?ondansetron 8 MG tablet ?Commonly known as: Zofran ?  ?oxyCODONE-acetaminophen 5-325 MG tablet ?Commonly known as: Percocet ?  ? ?  ? ?TAKE these medications   ? ?acetaminophen 500 MG tablet ?Commonly known as: TYLENOL ?Take 500 mg by mouth every 6 (six) hours as needed for moderate pain or headache. ?  ?amiodarone 200 MG tablet ?Commonly known as: PACERONE ?Take 2 tablets (400 mg total) by mouth 2 (two) times daily for 7 days, THEN 1 tablet (200 mg total) daily. ?Start taking on: January 24, 2022 ?  ?amoxicillin 500 MG capsule ?Commonly known as: AMOXIL ?Take 2,000 mg by mouth once. Take 30-60 minutes before dental appointment ?  ?Eliquis 5 MG Tabs tablet ?Generic drug: apixaban ?Take 5 mg by mouth 2 (  two) times daily. ?  ?levothyroxine 88 MCG tablet ?Commonly known as: SYNTHROID ?Take 1 tablet (88 mcg total) by mouth daily before breakfast. ?What changed:  ?medication strength ?how much to take ?   ?lidocaine-prilocaine cream ?Commonly known as: EMLA ?Apply 1 application topically daily as needed. ?What changed: reasons to take this ?  ?polyvinyl alcohol 1.4 % ophthalmic solution ?Commonly known as: LIQUIFILM TEARS ?Place 1 drop into both eyes daily as needed for dry eyes. ?  ? ?  ? ? Follow-up Information   ? ? [facility] Schedule an appointment as soon as possible for a visit in 2 week(s).   ?Contact information: ?[facility] ?[place] [address] ?[phone] ? ? ?  ?  ? ? [facility]. Schedule an appointment as soon as possible for a visit in 2 week(s).   ?Specialty: Cardiology ?Contact information: ?[address] ?[id number] [address] ?[place] ?[phone] ? ?  ?  ? ?  ?  ? ?  ? ?Disposition: Home ?Diet recommendation: Cardiac diet ? ?Discharge Exam: ?Filed Weights  ? 01/22/22 5400 01/23/22 0443 01/24/22 0501  ?Weight: 60.3 kg 64.7 kg 64.9 kg  ? ?General: Appear in no distress; no visible Abnormal Neck Mass Or lumps, Conjunctiva normal ?Cardiovascular: S1 and S2 Present, aortic systolic  Murmur, ?Respiratory: good respiratory effort, Bilateral Air entry present and CTA, no Crackles, no wheezes ?Abdomen: Bowel Sound present Non tender ?Extremities: no Pedal edema ?Neurology: alert and oriented to time, place, and person ?Gait not checked due to patient safety concerns  ? ?Condition at discharge: good ? ?The results of significant diagnostics from this hospitalization (including imaging, microbiology, ancillary and laboratory) are listed below for reference.  ? ?Imaging Studies: ?CT HEAD WO CONTRAST (5MM) ? ?Result Date: 12/30/2021 ?CLINICAL DATA:  Headache. EXAM: CT HEAD WITHOUT CONTRAST TECHNIQUE: Contiguous axial images were obtained from the base of the skull through the vertex without intravenous contrast. RADIATION DOSE REDUCTION: This exam was performed according to the departmental dose-optimization program  which includes automated exposure control, adjustment of the mA and/or kV according to patient size and/or use of iterative reconstruction technique. COMPARISON:  03/04/2019 FINDINGS: Brain: There is no evidence for acute hemorrhage, hydrocephalus, mass lesion, or abnormal extra-axial fluid collection. No definite CT evidence for acute infarction. Patchy low attenuation in the deep hemispheric and periventricular white matter is nonspecific, but likely reflects chronic microvascular ischemic demyelination. A tiny 4 mm focus of hyper attenuation is identified in the left temporal lobe (well seen on coronal image 35 of series 5). This was not visible on the prior CT scan. Vascular: Prominence of the vascular anatomy is compatible with the recent CTA chest earlier today. Skull: No evidence for fracture. No worrisome lytic or sclerotic lesion. Sinuses/Orbits: Chronic mucosal thickening is seen in the ethmoid air cells bilaterally and right sphenoid sinus. Visualized portions of the globes and intraorbital fat are unremarkable. Other: None. IMPRESSION: 1. 4 mm focus of hyper attenuation in the left temporal lobe. Given the recent contrast administration for chest CTA, this could represent a tiny enhancing lesion such as vascular malformation or metastatic deposit (although this would be distinctly atypical for lymphoma). Small focus of acute blood products could also have this appearance as could a small parenchymal calcification. Follow-up MRI of the brain without and with contrast recommended to further evaluate. 2. Chronic small vessel ischemic disease. 3. Chronic paranasal sinusitis. Electronically Signed   By: Misty Stanley M.D.   On:  12/30/2021 18:34  ? ?CT Angio Chest PE W and/or Wo Contrast ? ?Result Date: 12/30/2021 ?CLINICAL DATA:  Shortness of breath and elevated D-dimer. History of lymphoma. EXAM: CT ANGIOGRAPHY CHEST WITH CONTRAST TECHNIQUE: Multidetector CT imaging of the chest was performed using the  standard protocol during bolus administration of intravenous contrast. Multiplanar CT image reconstructions and MIPs were obtained to evaluate the vascular anatomy. RADIATION DOSE REDUCTION: This exam was performed ac

## 2022-02-01 DIAGNOSIS — I2510 Atherosclerotic heart disease of native coronary artery without angina pectoris: Secondary | ICD-10-CM | POA: Insufficient documentation

## 2022-02-01 DIAGNOSIS — I5020 Unspecified systolic (congestive) heart failure: Secondary | ICD-10-CM | POA: Insufficient documentation

## 2022-02-01 NOTE — Progress Notes (Addendum)
Is not ?Cardiology Office Note:   ? ?Date:  02/02/2022  ? ?ID:  Carrie [name], DOB 02-12-1941, MRN [id number] ? ?PCP:  [name]  ?[facility] Providers ?Cardiologist:  [name], MD    ?Referring MD: [name]  ? ?Chief Complaint:  Hospitalization Follow-up (S/p cardiac cath; Admx with AF w RVR - started on Amio) ?  ? ?Patient Profile: ?White coat Hypertension  ?Follicular lymphoma grade 3 ?Paroxysmal atrial fibrillation  ?Admx 9/16 w AF with RVR in setting of Ecoli urosepsis and AKI ?Recurrent episodes on OP monitor >> anticoagulation  ?Flecainide Rx; Apixaban Rx ?ETT: no VT ?Recurrent AF w RVR >> Amiodarone (02/2019) >> DCd in 06/2019 (? TSH, ? T4) ?Recurrent AF w RVR >> s/p DCCV in 11/22  ?Recurrent AF w RVR 12/2021 >> admx in 01/2022 - restarted Amiodarone ?HFimpEF (Heart Failure with improved EF) ?Dilated cardiomyopathy ?CMR 11/16: EF 40-45;  However f/u Echocardiogram 12/16: EF 55-60 ?admx in 02/2019 with AF w RVR >> DCCV; Amio Rx ?Echocardiogram 02/2019: EF 20-25 >> Echocardiogram 05/2019: EF 55-60 ?Tachycardia induced CM  ?Echocardiogram 1/22: EF 55-60, Gr 1 DD, GLS -17.9% ?Coronary artery disease  ?Minimal non-obstructive CAD >> Cath 12/2021: pRCA 15 ?Hypothyroidism  ?Chronic kidney disease stage 3  ?Aortic atherosclerosis (CT 10/2020) ?  ?Prior CV Studies: ?R/L Cardiac catheterization 01/15/2022 ?RCA proximal 15 ?PCWP 11, mean PA 21, Fick CO 6.09 ?EF 50-55 ? ?Echocardiogram 12/31/21 ?Inf HK, EF 50-55, normal PASP 38, normal RVSF, mild LAE, mild MR, mild to mod TR, mild AI, AV sclerosis w/o AS ?  ?Echocardiogram 10/31/2020 ?EF 55-60, GR 1 DD, GLS -17.9% (normal), normal RVSF, moderate LAE, mild-moderate MR, mild AI ?  ?Echocardiogram 05/24/2019 ?EF 55-60, GRII DD, normal RVSF, mild LAE, mild MR, trivial AI, PASP 28 ?  ?Echocardiogram 02/25/2019 ?EF 20-25, severely reduced RVSF, RVSP 23.5, mild BAE, trivial pericardial effusion, moderate MR, trivial AI ?  ?Echocardiogram  10/15/2015 ?EF 60-65, normal wall motion, severe LAE ?  ?Echo 06/26/15 ?Inferobasal AK, EF 55%, mild AI, mild MR, mild LAE ?  ?Echo 4/16 ?EF 55-60%, normal wall motion, grade 2 diastolic dysfunction, trivial AI, mild MR, moderate LAE, mild TR, trivial PI, PASP 33 mmHg ?  ?Myoview 11/10 ?Normal, EF 66% ?  ?Carotid US 6/05 ?No ICA stenosis ? ? ? ?History of Present Illness:   ?Carrie [name] is a 81 y.o. female with the above problem list.  She underwent cardioversion in November 2022 for recurrent atrial fibrillation with rapid rate.  She was admitted in March 2023 with shortness of breath, elevated blood pressure and headaches.  EF was low normal with inferior hypokinesis on echocardiogram.  She remained in sinus rhythm.  She was seen in follow-up 01/11/2022.  She continued to have significant shortness of breath and was back in atrial fibrillation with rapid rate.  She was set up for cardiac catheterization first to further evaluate her symptoms with plans to pursue rhythm control afterwards.   This was performed 01/15/2022 and demonstrated minimal plaque in the proximal RCA, normal filling pressures and normal cardiac output. ? ?The patient was admitted 4/6-4/9 with symptomatic atrial fibrillation with rapid rate.  She presented with shortness of breath.  She was placed on diltiazem IV for rate control but developed significant orthostatic hypotension.  Case was discussed with cardiology by telephone with recommendation to discontinue diltiazem and start amiodarone.  She required IV fluids to correct hypotension and did not require diuretic therapy. ? ?She returns for  follow-up.  She is here today with her husband.  Since discharge in the hospital, she has done well.  Her breathing has improved somewhat.  She continues to have a chronic cough since she was diagnosed with COVID several weeks ago.  Chest pain, syncope, orthopnea.  Unfortunately, she was reaching to turn a lamp on while in her bed a few days ago.  She  fell to the floor and hit her head. ?   ?Past Medical History:  ?Diagnosis Date  ? A-fib ([place])   ? Anemia   ? pt denies   ? Atrial fibrillation with rapid ventricular response ([place]) 06/25/2015  ? Carpal tunnel syndrome, bilateral   ? pt denies   ? Cervical spondylosis without myelopathy 10/25/2013  ? Dental bridge present   ? DJD (degenerative joint disease)   ? Dyspnea   ? sometimes hx of cancer non hodgkins lymphoma   ? Dysrhythmia   ? WENT INTO A FIB IN 2017   ? Elevated troponin 02/18/2015  ? Follicular lymphoma grade 3a ([place]) 02/03/2015  ? hx of nonhodgkins lymphoma x 3   ? Headache   ? sometimes a headache   ? History of echocardiogram   ? Echo 12/16: EF 60-65%, no RWMA, severe LAE  ? History of nuclear stress test   ? Myoview 1/17: EF 55%, Normal study. No ischemia or scar.  ? Hypertension   ? Hypothyroid   ? Nausea without vomiting 06/20/2015  ? Stage III chronic kidney disease ([place]) 07/01/2015  ? Thrush of mouth and esophagus ([place]) 06/25/2015  ? ?Current Medications: ?Current Meds  ?Medication Sig  ? acetaminophen (TYLENOL) 500 MG tablet Take 500 mg by mouth every 6 (six) hours as needed for moderate pain or headache.  ? amiodarone (PACERONE) 200 MG tablet Take 1 tablet (200 mg total) by mouth daily.  ? amoxicillin (AMOXIL) 500 MG capsule Take 2,000 mg by mouth once. Take 30-60 minutes before dental appointment  ? apixaban (ELIQUIS) 5 MG TABS tablet Take 5 mg by mouth 2 (two) times daily.   ? levothyroxine (SYNTHROID) 88 MCG tablet Take 1 tablet (88 mcg total) by mouth daily before breakfast.  ? lidocaine-prilocaine (EMLA) cream Apply 1 application topically daily as needed. (Patient taking differently: Apply 1 application. topically daily as needed (For port).)  ? polyvinyl alcohol (LIQUIFILM TEARS) 1.4 % ophthalmic solution Place 1 drop into both eyes daily as needed for dry eyes.  ? [DISCONTINUED] amiodarone (PACERONE) 200 MG tablet Take 2 tablets (400 mg total) by mouth 2 (two) times daily for 7 days, THEN 1 tablet  (200 mg total) daily.  ?  ?Allergies:   Amiodarone  ? ?Social History  ? ?Tobacco Use  ? Smoking status: Never  ? Smokeless tobacco: Never  ?Vaping Use  ? Vaping Use: Never used  ?Substance Use Topics  ? Alcohol use: Not Currently  ? Drug use: No  ?  ?Family Hx: ?The patient's family history includes Cancer in her mother and sister. ? ?Review of Systems  ?Respiratory:  Positive for cough.    ? ?EKGs/Labs/Other Test Reviewed:   ? ?EKG:  EKG is  ordered today.  The ekg ordered today demonstrates NSR, HR 76, PR 202, QTc 524, nonspecific ST-T wave changes ? ?Recent Labs: ?01/21/2022: ALT 22; B Natriuretic Peptide 677.6 ?01/22/2022: TSH 2.445 ?01/24/2022: BUN 21; Creatinine, Ser 1.17; Hemoglobin 11.7; Magnesium 1.8; Platelets 153; Potassium 3.5; Sodium 136  ? ?Recent Lipid Panel ?No results for input(s): CHOL, TRIG, HDL, VLDL, LDLCALC,  LDLDIRECT in the last 8760 hours.  ? ?Risk Assessment/Calculations:   ? ?CHA2DS2-VASc Score = 6  ? This indicates a 9.7% annual risk of stroke. ?The patient's score is based upon: ?CHF History: 1 ?HTN History: 1 ?Diabetes History: 0 ?Stroke History: 0 ?Vascular Disease History: 1 ?Age Score: 2 ?Gender Score: 1 ?  ? ?    ?Physical Exam:   ? ?VS:  BP 136/80   Pulse 76   Ht '5\' 1"'$  (1.549 m)   Wt 136 lb 9.6 oz (62 kg)   BMI 25.81 kg/m?    ? ?Wt Readings from Last 3 Encounters:  ?02/02/22 136 lb 9.6 oz (62 kg)  ?01/24/22 143 lb 1.3 oz (64.9 kg)  ?01/15/22 135 lb (61.2 kg)  ?  ?Constitutional:   ?   Appearance: Healthy appearance. Not in distress.  ?HENT:  ?  ?   Comments: Ecchymosis right temple; left mandible ?Neck:  ?   Vascular: No JVR. JVD normal.  ?Pulmonary:  ?   Effort: Pulmonary effort is normal.  ?   Breath sounds: No wheezing. No rales.  ?Cardiovascular:  ?   Normal rate. Regular rhythm. Normal S1. Normal S2.   ?   Murmurs: There is a grade 2/6 systolic murmur at the ULSB.  ?Edema: ?   Peripheral edema absent.  ?Abdominal:  ?   Palpations: Abdomen is soft.  ?Skin: ?   General: Skin is  warm and dry.  ?Neurological:  ?   Mental Status: Alert and oriented to person, place and time.  ?   Cranial Nerves: Cranial nerves are intact.  ?  ?    ?ASSESSMENT & PLAN:   ?PAF (paroxysmal atrial fibrillation) ([place]

## 2022-02-02 ENCOUNTER — Ambulatory Visit: Payer: Medicare HMO | Admitting: Physician Assistant

## 2022-02-02 ENCOUNTER — Encounter: Payer: Self-pay | Admitting: Hematology and Oncology

## 2022-02-02 ENCOUNTER — Encounter: Payer: Self-pay | Admitting: Physician Assistant

## 2022-02-02 ENCOUNTER — Other Ambulatory Visit: Payer: Medicare HMO

## 2022-02-02 ENCOUNTER — Ambulatory Visit (INDEPENDENT_AMBULATORY_CARE_PROVIDER_SITE_OTHER)
Admission: RE | Admit: 2022-02-02 | Discharge: 2022-02-02 | Disposition: A | Discharge status: Home / Self Care | Payer: Medicare HMO | Source: Ambulatory Visit | Attending: Physician Assistant | Admitting: Physician Assistant

## 2022-02-02 VITALS — BP 136/80 | HR 76 | Ht 61.0 in | Wt 136.6 lb

## 2022-02-02 DIAGNOSIS — I10 Essential (primary) hypertension: Secondary | ICD-10-CM

## 2022-02-02 DIAGNOSIS — I5020 Unspecified systolic (congestive) heart failure: Secondary | ICD-10-CM | POA: Diagnosis not present

## 2022-02-02 DIAGNOSIS — I2510 Atherosclerotic heart disease of native coronary artery without angina pectoris: Secondary | ICD-10-CM

## 2022-02-02 DIAGNOSIS — S0990XA Unspecified injury of head, initial encounter: Secondary | ICD-10-CM

## 2022-02-02 DIAGNOSIS — N1831 Chronic kidney disease, stage 3a: Secondary | ICD-10-CM | POA: Diagnosis not present

## 2022-02-02 DIAGNOSIS — I480 Paroxysmal atrial fibrillation: Secondary | ICD-10-CM

## 2022-02-02 DIAGNOSIS — R059 Cough, unspecified: Secondary | ICD-10-CM

## 2022-02-02 DIAGNOSIS — C8238 Follicular lymphoma grade IIIa, lymph nodes of multiple sites: Secondary | ICD-10-CM

## 2022-02-02 DIAGNOSIS — E038 Other specified hypothyroidism: Secondary | ICD-10-CM

## 2022-02-02 LAB — BASIC METABOLIC PANEL
BUN/Creatinine Ratio: 16 (ref 12–28)
BUN: 22 mg/dL (ref 8–27)
CO2: 27 mmol/L (ref 20–29)
Calcium: 8.7 mg/dL (ref 8.7–10.3)
Chloride: 101 mmol/L (ref 96–106)
Creatinine, Ser: 1.35 mg/dL — ABNORMAL HIGH (ref 0.57–1.00)
Glucose: 91 mg/dL (ref 70–99)
Potassium: 3.8 mmol/L (ref 3.5–5.2)
Sodium: 137 mmol/L (ref 134–144)
eGFR: 40 mL/min/{1.73_m2} — ABNORMAL LOW (ref >59)

## 2022-02-02 LAB — MAGNESIUM: Magnesium: 1.8 mg/dL (ref 1.6–2.3)

## 2022-02-02 MED ORDER — AMIODARONE HCL 200 MG PO TABS: 200.0000 mg | ORAL_TABLET | Freq: Every day | ORAL | 3 refills | Status: DC

## 2022-02-02 NOTE — Assessment & Plan Note (Addendum)
She was recently admitted with atrial fibrillation with rapid rate.  Diltiazem and metoprolol were stopped due to orthostatic hypotension.  She was placed on amiodarone.  She is back in sinus rhythm today.  She is doing much better.  She did take amiodarone in 2020.  She has a history of hypothyroidism and is on Synthroid.  Her amiodarone was discontinued in 2020 secondary to low TSH/elevated T4.  Since she is maintaining sinus rhythm with amiodarone, I think we can continue with this management.  If she has changes in her TSH going forward, we can initially adjust her Synthroid dose.  If she continues to have issues, we may have to stop her amiodarone again.  QTc is somewhat prolonged today.  She is still taking amiodarone 400 mg twice daily. ?? Continue Eliquis 5 mg twice daily.   ?? Decrease amiodarone to 200 mg daily ?? BMET, magnesium today ?? Keep A-fib clinic appointment as planned ?? CMET, TSH in 3 months ?? We discussed the need for annual chest x-rays, eye exams ?? Follow-up in 3 months ?

## 2022-02-02 NOTE — Assessment & Plan Note (Signed)
She had minimal plaque on cardiac catheterization.  She is not on aspirin as she is on Eliquis.  She will likely need to take at least low-dose statin therapy.  I will arrange fasting lipids. ?

## 2022-02-02 NOTE — Assessment & Plan Note (Signed)
Fair control.  She did have some lower blood pressure readings at home.  When she was initially discharged, she had some higher readings.  Given her previous history of orthostatic hypotension, I have suggested that we continue to monitor blood pressure over the next couple weeks.  If she has blood pressure that remains above target, consider adding ACE/ARB versus amlodipine. ?

## 2022-02-02 NOTE — Assessment & Plan Note (Signed)
Echocardiogram in March 2023 with EF 50-55.  She is doing much better in normal sinus rhythm.  Volume status is currently stable and she is not currently taking diuretic therapy. ?

## 2022-02-02 NOTE — Assessment & Plan Note (Signed)
Continue to have a chronic cough since she had COVID-19.  I have asked her to follow-up with primary care for further evaluation management. ?

## 2022-02-02 NOTE — Patient Instructions (Signed)
Medication Instructions:  ?Your physician has recommended you make the following change in your medication:  ? REDUCE the Amiodarone to 200 mg taking 1 daily  ? ?*If you need a refill on your cardiac medications before your next appointment, please call your pharmacy* ? ? ?Lab Work: ?TODAY:  BMET & MAG ? ?05/04/22 AT APPOINTMENT, COME FASTING FOR:  CMET, LIPID, & TSH  ? ?If you have labs (blood work) drawn today and your tests are completely normal, you will receive your results only by: ?MyChart Message (if you have MyChart) OR ?A paper copy in the mail ?If you have any lab test that is abnormal or we need to change your treatment, we will call you to review the results. ? ? ?Testing/Procedures: ?Non-Cardiac CT scanning, (CAT scanning), is a noninvasive, special x-ray that produces cross-sectional images of the body using x-rays and a computer. CT scans help physicians diagnose and treat medical conditions. For some CT exams, a contrast material is used to enhance visibility in the area of the body being studied. CT scans provide greater clarity and reveal more details than regular x-ray exams. ? ? ? ?Follow-Up: ?At [facility], you and your health needs are our priority.  As part of our continuing mission to provide you with exceptional heart care, we have created designated Provider Care Teams.  These Care Teams include your primary Cardiologist (physician) and Advanced Practice Providers (APPs -  Physician Assistants and Nurse Practitioners) who all work together to provide you with the care you need, when you need it. ? ?We recommend signing up for the patient portal called "MyChart".  Sign up information is provided on this After Visit Summary.  MyChart is used to connect with patients for Virtual Visits (Telemedicine).  Patients are able to view lab/test results, encounter notes, upcoming appointments, etc.  Non-urgent messages can be sent to your provider as well.   ?To learn more about what you can do  with MyChart, go to [email or web address].   ? ?Your next appointment:   ?05/04/22 ARRIVE AT 7:45 ? ?The format for your next appointment:   ?In Person ? ?Provider:   ?[name], MD   ? ? ?Other Instructions ?Your physician has requested that you regularly monitor and record your blood pressure readings at home. Please use the same machine at the same time of day to check your readings and record them to bring to your follow-up visit. ?  ?Please monitor blood pressures and keep a log of your readings.  ?  ?Make sure to check 2 hours after your medications. ?  ?AVOID these things for 30 minutes before checking your blood pressure: ?No Drinking caffeine. ?No Drinking alcohol. ?No Eating. ?No Smoking. ?No Exercising. ?  ?Five minutes before checking your blood pressure: ?Pee. ?Sit in a dining chair. Avoid sitting in a soft couch or armchair. ?Be quiet. Do not talk ? ? ?Important Information About Sugar ? ? ? ? ?  ?

## 2022-02-02 NOTE — Assessment & Plan Note (Signed)
Obtain follow-up BMET today. 

## 2022-02-02 NOTE — Assessment & Plan Note (Signed)
She fell from her bed several days ago and hit her head.  She has a bruise on her right temple and left chin.  She has hardwood floors in her room.  As she is on chronic anticoagulation with Eliquis and fell hard enough to cause ecchymosis, I have recommended proceeding with a head CT without contrast to rule out intracranial bleeding. ?? Head CT without contrast today ?

## 2022-02-10 ENCOUNTER — Encounter: Payer: Self-pay | Admitting: *Deleted

## 2022-02-11 ENCOUNTER — Inpatient Hospital Stay (HOSPITAL_COMMUNITY): Admission: RE | Admit: 2022-02-11 | Payer: Medicare HMO | Source: Ambulatory Visit | Admitting: Nurse Practitioner

## 2022-02-11 ENCOUNTER — Encounter (HOSPITAL_COMMUNITY): Payer: Self-pay

## 2022-02-16 ENCOUNTER — Observation Stay (HOSPITAL_COMMUNITY)
Admission: EM | Admit: 2022-02-16 | Discharge: 2022-02-18 | Disposition: A | Discharge status: Home / Self Care | Payer: No Typology Code available for payment source | Attending: Internal Medicine | Admitting: Internal Medicine

## 2022-02-16 ENCOUNTER — Encounter (HOSPITAL_COMMUNITY): Payer: Self-pay | Admitting: Internal Medicine

## 2022-02-16 ENCOUNTER — Encounter: Payer: Self-pay | Admitting: Hematology and Oncology

## 2022-02-16 ENCOUNTER — Emergency Department (HOSPITAL_COMMUNITY): Payer: No Typology Code available for payment source

## 2022-02-16 DIAGNOSIS — D72819 Decreased white blood cell count, unspecified: Secondary | ICD-10-CM | POA: Diagnosis not present

## 2022-02-16 DIAGNOSIS — N179 Acute kidney failure, unspecified: Secondary | ICD-10-CM | POA: Diagnosis present

## 2022-02-16 DIAGNOSIS — Z7901 Long term (current) use of anticoagulants: Secondary | ICD-10-CM | POA: Insufficient documentation

## 2022-02-16 DIAGNOSIS — R531 Weakness: Secondary | ICD-10-CM

## 2022-02-16 DIAGNOSIS — N1831 Chronic kidney disease, stage 3a: Secondary | ICD-10-CM | POA: Insufficient documentation

## 2022-02-16 DIAGNOSIS — R8290 Unspecified abnormal findings in urine: Secondary | ICD-10-CM

## 2022-02-16 DIAGNOSIS — E039 Hypothyroidism, unspecified: Secondary | ICD-10-CM | POA: Insufficient documentation

## 2022-02-16 DIAGNOSIS — I2510 Atherosclerotic heart disease of native coronary artery without angina pectoris: Secondary | ICD-10-CM | POA: Insufficient documentation

## 2022-02-16 DIAGNOSIS — Z79899 Other long term (current) drug therapy: Secondary | ICD-10-CM | POA: Diagnosis not present

## 2022-02-16 DIAGNOSIS — I5033 Acute on chronic diastolic (congestive) heart failure: Secondary | ICD-10-CM

## 2022-02-16 DIAGNOSIS — I4891 Unspecified atrial fibrillation: Secondary | ICD-10-CM

## 2022-02-16 DIAGNOSIS — D61818 Other pancytopenia: Secondary | ICD-10-CM | POA: Diagnosis present

## 2022-02-16 DIAGNOSIS — I10 Essential (primary) hypertension: Secondary | ICD-10-CM | POA: Diagnosis not present

## 2022-02-16 DIAGNOSIS — I5030 Unspecified diastolic (congestive) heart failure: Secondary | ICD-10-CM | POA: Diagnosis present

## 2022-02-16 DIAGNOSIS — I509 Heart failure, unspecified: Secondary | ICD-10-CM | POA: Diagnosis not present

## 2022-02-16 DIAGNOSIS — I130 Hypertensive heart and chronic kidney disease with heart failure and stage 1 through stage 4 chronic kidney disease, or unspecified chronic kidney disease: Secondary | ICD-10-CM | POA: Diagnosis not present

## 2022-02-16 DIAGNOSIS — D649 Anemia, unspecified: Secondary | ICD-10-CM | POA: Diagnosis not present

## 2022-02-16 DIAGNOSIS — R197 Diarrhea, unspecified: Secondary | ICD-10-CM | POA: Diagnosis not present

## 2022-02-16 DIAGNOSIS — R8271 Bacteriuria: Secondary | ICD-10-CM

## 2022-02-16 DIAGNOSIS — E875 Hyperkalemia: Secondary | ICD-10-CM

## 2022-02-16 DIAGNOSIS — I482 Chronic atrial fibrillation, unspecified: Secondary | ICD-10-CM | POA: Diagnosis present

## 2022-02-16 DIAGNOSIS — C8238 Follicular lymphoma grade IIIa, lymph nodes of multiple sites: Secondary | ICD-10-CM | POA: Diagnosis not present

## 2022-02-16 DIAGNOSIS — Z96651 Presence of right artificial knee joint: Secondary | ICD-10-CM | POA: Diagnosis not present

## 2022-02-16 DIAGNOSIS — R0602 Shortness of breath: Secondary | ICD-10-CM | POA: Diagnosis present

## 2022-02-16 DIAGNOSIS — E038 Other specified hypothyroidism: Secondary | ICD-10-CM

## 2022-02-16 DIAGNOSIS — I480 Paroxysmal atrial fibrillation: Secondary | ICD-10-CM | POA: Diagnosis not present

## 2022-02-16 DIAGNOSIS — Z96612 Presence of left artificial shoulder joint: Secondary | ICD-10-CM | POA: Insufficient documentation

## 2022-02-16 DIAGNOSIS — E876 Hypokalemia: Secondary | ICD-10-CM | POA: Insufficient documentation

## 2022-02-16 DIAGNOSIS — R110 Nausea: Secondary | ICD-10-CM

## 2022-02-16 DIAGNOSIS — D6869 Other thrombophilia: Secondary | ICD-10-CM

## 2022-02-16 DIAGNOSIS — I5032 Chronic diastolic (congestive) heart failure: Secondary | ICD-10-CM | POA: Diagnosis present

## 2022-02-16 DIAGNOSIS — Z8616 Personal history of COVID-19: Secondary | ICD-10-CM | POA: Diagnosis not present

## 2022-02-16 LAB — URINALYSIS, ROUTINE W REFLEX MICROSCOPIC
Bilirubin Urine: NEGATIVE
Glucose, UA: NEGATIVE mg/dL
Ketones, ur: NEGATIVE mg/dL
Leukocytes,Ua: NEGATIVE
Nitrite: POSITIVE — AB
Protein, ur: 30 mg/dL — AB
Specific Gravity, Urine: 1.014 (ref 1.005–1.030)
pH: 6 (ref 5.0–8.0)

## 2022-02-16 LAB — POC OCCULT BLOOD, ED: Fecal Occult Bld: NEGATIVE

## 2022-02-16 LAB — COMPREHENSIVE METABOLIC PANEL
ALT: 45 U/L — ABNORMAL HIGH (ref 0–44)
AST: 48 U/L — ABNORMAL HIGH (ref 15–41)
Albumin: 2.9 g/dL — ABNORMAL LOW (ref 3.5–5.0)
Alkaline Phosphatase: 73 U/L (ref 38–126)
Anion gap: 10 (ref 5–15)
BUN: 16 mg/dL (ref 8–23)
CO2: 25 mmol/L (ref 22–32)
Calcium: 8.2 mg/dL — ABNORMAL LOW (ref 8.9–10.3)
Chloride: 97 mmol/L — ABNORMAL LOW (ref 98–111)
Creatinine, Ser: 1.34 mg/dL — ABNORMAL HIGH (ref 0.44–1.00)
GFR, Estimated: 40 mL/min — ABNORMAL LOW (ref >60)
Glucose, Bld: 113 mg/dL — ABNORMAL HIGH (ref 70–99)
Potassium: 3.2 mmol/L — ABNORMAL LOW (ref 3.5–5.1)
Sodium: 132 mmol/L — ABNORMAL LOW (ref 135–145)
Total Bilirubin: 0.9 mg/dL (ref 0.3–1.2)
Total Protein: 5.7 g/dL — ABNORMAL LOW (ref 6.5–8.1)

## 2022-02-16 LAB — CBC
HCT: 29.8 % — ABNORMAL LOW (ref 36.0–46.0)
Hemoglobin: 9.6 g/dL — ABNORMAL LOW (ref 12.0–15.0)
MCH: 29.2 pg (ref 26.0–34.0)
MCHC: 32.2 g/dL (ref 30.0–36.0)
MCV: 90.6 fL (ref 80.0–100.0)
Platelets: 274 10*3/uL (ref 150–400)
RBC: 3.29 MIL/uL — ABNORMAL LOW (ref 3.87–5.11)
RDW: 13.8 % (ref 11.5–15.5)
WBC: 1.6 10*3/uL — ABNORMAL LOW (ref 4.0–10.5)
nRBC: 0 % (ref 0.0–0.2)

## 2022-02-16 LAB — VITAMIN B12: Vitamin B-12: 2997 pg/mL — ABNORMAL HIGH (ref 180–914)

## 2022-02-16 LAB — BRAIN NATRIURETIC PEPTIDE: B Natriuretic Peptide: 1028.2 pg/mL — ABNORMAL HIGH (ref 0.0–100.0)

## 2022-02-16 LAB — FOLATE: Folate: 22.5 ng/mL (ref >5.9)

## 2022-02-16 LAB — PHOSPHORUS: Phosphorus: 2.7 mg/dL (ref 2.5–4.6)

## 2022-02-16 LAB — TROPONIN I (HIGH SENSITIVITY)
Troponin I (High Sensitivity): 49 ng/L — ABNORMAL HIGH (ref <18)
Troponin I (High Sensitivity): 47 ng/L — ABNORMAL HIGH (ref <18)

## 2022-02-16 LAB — T4, FREE: Free T4: 1.39 ng/dL — ABNORMAL HIGH (ref 0.61–1.12)

## 2022-02-16 LAB — TSH: TSH: 2.529 u[IU]/mL (ref 0.350–4.500)

## 2022-02-16 LAB — MAGNESIUM: Magnesium: 1.6 mg/dL — ABNORMAL LOW (ref 1.7–2.4)

## 2022-02-16 MED ORDER — SODIUM CHLORIDE 0.9 % IV BOLUS
500.0000 mL | Freq: Once | INTRAVENOUS | Status: AC
  Administered 2022-02-16: 500 mL via INTRAVENOUS

## 2022-02-16 MED ORDER — ONDANSETRON HCL 4 MG/2ML IJ SOLN
4.0000 mg | Freq: Once | INTRAMUSCULAR | Status: AC
  Administered 2022-02-16: 4 mg via INTRAVENOUS
  Filled 2022-02-16: qty 2

## 2022-02-16 MED ORDER — SODIUM CHLORIDE 0.9 % IV SOLN
1.0000 g | Freq: Once | INTRAVENOUS | Status: AC
  Administered 2022-02-16: 1 g via INTRAVENOUS
  Filled 2022-02-16: qty 10

## 2022-02-16 MED ORDER — AMIODARONE HCL IN DEXTROSE 360-4.14 MG/200ML-% IV SOLN
30.0000 mg/h | INTRAVENOUS | Status: DC
  Administered 2022-02-16 – 2022-02-18 (×3): 30 mg/h via INTRAVENOUS
  Filled 2022-02-16 (×3): qty 200

## 2022-02-16 MED ORDER — AMIODARONE HCL IN DEXTROSE 360-4.14 MG/200ML-% IV SOLN
60.0000 mg/h | INTRAVENOUS | Status: AC
  Administered 2022-02-16: 60 mg/h via INTRAVENOUS
  Filled 2022-02-16: qty 200

## 2022-02-16 MED ORDER — SODIUM CHLORIDE 0.9 % IV SOLN: INTRAVENOUS | Status: DC

## 2022-02-16 MED ORDER — SODIUM CHLORIDE 0.9 % IV SOLN: Freq: Once | INTRAVENOUS | Status: DC

## 2022-02-16 MED ORDER — POTASSIUM CHLORIDE 10 MEQ/100ML IV SOLN
10.0000 meq | Freq: Once | INTRAVENOUS | Status: AC
  Administered 2022-02-16: 10 meq via INTRAVENOUS
  Filled 2022-02-16: qty 100

## 2022-02-16 MED ORDER — POTASSIUM CHLORIDE CRYS ER 20 MEQ PO TBCR
40.0000 meq | EXTENDED_RELEASE_TABLET | Freq: Two times a day (BID) | ORAL | Status: DC
  Administered 2022-02-16 – 2022-02-17 (×2): 40 meq via ORAL
  Filled 2022-02-16 (×3): qty 2

## 2022-02-16 MED ORDER — AMIODARONE LOAD VIA INFUSION
150.0000 mg | Freq: Once | INTRAVENOUS | Status: AC
  Administered 2022-02-16: 150 mg via INTRAVENOUS
  Filled 2022-02-16: qty 83.34

## 2022-02-16 NOTE — ED Triage Notes (Signed)
Pt. Stated, A-Fib with SOB and I just feel bad. This started a couple of hours ago ?

## 2022-02-16 NOTE — Consult Note (Addendum)
?Cardiology Consult  ? ?Patient ID: Carrie [name] ?MRN: [id number]; DOB: 1940/12/30  ? ?Admission date: 02/16/2022 ? ?PCP:  [name] ?  ?[facility] Providers ?Cardiologist:  [name], MD      ? ? ?Chief Complaint:  feels bad  ? ?Patient Profile:  ? ?Carrie [name] is a 81 y.o. female with follicular lymphoma post chemo, with leukopenia and with treatment for 2 years.  DCM 02/2019 EF 20-25%, was in a fib DCCV and placed on amiodarone and on eliquis.  EF improved to normal. CAD treated medically.   Who is being seen 02/16/2022 for the evaluation of atrial fib. ? ?History of Present Illness:  ? ?Carrie [name]  with above hx and prior a fib with drop in EF but with amiodarone and DCCV to SR her EF improved and amiodarone stopped.  She was admitted in March this year with dyspnea and she was in [place].  Neg for PE on CTA, she had recently had COVID.    Echo similar mild MR, mild to mod TR AR mild.  She went back into a fib 01/11/22  her BB increased   it was arranged for outpt cath.   ? ?Eliquis held and she underwent cath with mild nonobstructive disease 15% in pRCA. EF 50-55% with the cath she was in a fib with RVR -post cath 01/15/22 she was hypotensive and given IV fluids.  Rate was controlled at discharge.  ? ?Back to ER 01/21/22 with a fib RVR at 150 placed on IV dilt. She had CHF and given IV lasix.  COVID test was + and had been + for 2 months. TSH was normal.  She had orthostatic hypotension. Her dilt stopped and placed on IV amiodarone. Her BB was stopped as well.  ? ?On follow up 02/02/22 visit she was well.  She was in SR.  Amiodarone was decreased to 200 mg daily.  She had head CT for hitting her head with fall. Neg for abnormality.  ? ?She presents to ER today with a fib RVR and SOB feels bad, began a couple of hours ago.  ? ?EKG:  The ECG that was done 02/16/22 was personally reviewed and demonstrates atrial fib with RVR at 150 with PVCs and prolonged Qtc 501 ms ? ?Na 132 K+ 3.2 Cr 1.34  albumin 2.9 AST 48 ALT 45  ?Hs troponin 49 ?WBC 1,6 hgb 9.6 plts 274 ?Urine nitrite + ?Neg heme of stool X 2  ? ?PCXR:  IMPRESSION: ?Low lung volumes with vascular crowding and atelectasis, more ?pronounced on right. ?  ?Today she tells me she has not felt well even in SR but with a fib today she felt worse.  Not aware of rapid HR as a rule.  Here HR 140s to 99. No chest pain + SOB.   ? ?Past Medical History:  ?Diagnosis Date  ? A-fib ([place])   ? Anemia   ? pt denies   ? Atrial fibrillation with rapid ventricular response ([place]) 06/25/2015  ? Carpal tunnel syndrome, bilateral   ? pt denies   ? Cervical spondylosis without myelopathy 10/25/2013  ? Dental bridge present   ? DJD (degenerative joint disease)   ? Dyspnea   ? sometimes hx of cancer non hodgkins lymphoma   ? Dysrhythmia   ? WENT INTO A FIB IN 2017   ? Elevated troponin 02/18/2015  ? Follicular lymphoma grade 3a ([place]) 02/03/2015  ? hx of nonhodgkins lymphoma x 3   ?  Headache   ? sometimes a headache   ? History of echocardiogram   ? Echo 12/16: EF 60-65%, no RWMA, severe LAE  ? History of nuclear stress test   ? Myoview 1/17: EF 55%, Normal study. No ischemia or scar.  ? Hypertension   ? Hypothyroid   ? Nausea without vomiting 06/20/2015  ? Stage III chronic kidney disease ([place]) 07/01/2015  ? Thrush of mouth and esophagus ([place]) 06/25/2015  ? ? ?Past Surgical History:  ?Procedure Laterality Date  ? ABDOMINAL HYSTERECTOMY    ? APPENDECTOMY    ? BACK SURGERY    ? X5-lumbar-fusion  ? COLONOSCOPY    ? DILATION AND CURETTAGE OF UTERUS    ? LYMPH NODE BIOPSY Right 01/21/2015  ? Procedure: RIGHT GROIN LYMPH NODE BIOPSY;  Surgeon: Erroll Luna, MD;  Location: [facility];  Service: General;  Laterality: Right;  ? MASS EXCISION Left 08/29/2018  ? Procedure: EXCISION LEFT BACK  MASS;  Surgeon: Erroll Luna, MD;  Location: [facility];  Service: General;  Laterality: Left;  ? Ovarian cyst resection    ? patelar tendon transplants    ? Left/right  ? PORTACATH PLACEMENT  Right 02/13/2015  ? Procedure: INSERTION PORT-A-CATH WITH ULTRASOUND;  Surgeon: Erroll Luna, MD;  Location: [facility];  Service: General;  Laterality: Right;  ? PORTACATH PLACEMENT N/A 08/29/2018  ? Procedure: INSERTION PORT-A-CATH WITH ULTRA SOUND ERAS PATHWAY;  Surgeon: Erroll Luna, MD;  Location: [facility];  Service: General;  Laterality: N/A;  ? REVERSE SHOULDER ARTHROPLASTY Left 03/02/2021  ? Procedure: REVERSE SHOULDER ARTHROPLASTY;  Surgeon: Netta Cedars, MD;  Location: [facility];  Service: Orthopedics;  Laterality: Left;  ? RIGHT/LEFT HEART CATH AND CORONARY ANGIOGRAPHY N/A 01/15/2022  ? Procedure: RIGHT/LEFT HEART CATH AND CORONARY ANGIOGRAPHY;  Surgeon: [name], MD;  Location: [facility];  Service: Cardiovascular;  Laterality: N/A;  ? TOTAL KNEE ARTHROPLASTY  2011  ? Right  ? TUBAL LIGATION    ?  ? ?Medications Prior to Admission: ?Prior to Admission medications   ?Medication Sig Start Date End Date Taking? Authorizing Provider  ?acetaminophen (TYLENOL) 500 MG tablet Take 500 mg by mouth every 6 (six) hours as needed for moderate pain or headache.    [provider]  ?amiodarone (PACERONE) 200 MG tablet Take 1 tablet (200 mg total) by mouth daily. 02/02/22   [name] T, PA-C  ?amoxicillin (AMOXIL) 500 MG capsule Take 2,000 mg by mouth once. Take 30-60 minutes before dental appointment 06/30/21   [provider]  ?apixaban (ELIQUIS) 5 MG TABS tablet Take 5 mg by mouth 2 (two) times daily.     [provider]  ?levothyroxine (SYNTHROID) 88 MCG tablet Take 1 tablet (88 mcg total) by mouth daily before breakfast. 01/24/22   Lavina Hamman, MD  ?lidocaine-prilocaine (EMLA) cream Apply 1 application topically daily as needed. ?Patient taking differently: Apply 1 application. topically daily as needed (For port). 04/07/21   [name], MD  ?polyvinyl alcohol (LIQUIFILM TEARS) 1.4 % ophthalmic solution Place 1 drop into both eyes daily as needed for dry eyes.    [provider]  ?  ? ?Allergies:    ?Allergies  ?Allergen Reactions  ? Amiodarone Other (See Comments)  ?  Hyperthyroidism   ? ? ?Social History:   ?Social History  ? ?Socioeconomic History  ? Marital status: Widowed  ?  Spouse name: Not on file  ? Number of children: 2  ? Years of education:  hs  ? Highest education level: Not on file  ?Occupational History  ? Occupation: Retired  ?Tobacco Use  ? Smoking status: Never  ? Smokeless tobacco: Never  ?Vaping Use  ? Vaping Use: Never used  ?Substance and Sexual Activity  ? Alcohol use: Not Currently  ? Drug use: No  ? Sexual activity: Not on file  ?Other Topics Concern  ? Not on file  ?Social History Narrative  ? Lives alone.  Has a walker for home use.  ? ?Social Determinants of Health  ? ?Financial Resource Strain: Not on file  ?Food Insecurity: Not on file  ?Transportation Needs: Not on file  ?Physical Activity: Not on file  ?Stress: Not on file  ?Social Connections: Not on file  ?Intimate Partner Violence: Not on file  ?  ?Family History:   ?The patient's family history includes Cancer in her mother and sister.   ? ?ROS:  ?Please see the history of present illness.  ?General:no colds or fevers, no weight changes ?Skin:no rashes or ulcers ?HEENT:no blurred vision, no congestion ?CV:see HPI ?PUL:see HPI ?GI:no diarrhea constipation or melena, no indigestion ?GU:no hematuria, no dysuria ?MS:no joint pain, no claudication ?Neuro:no syncope, no lightheadedness ?Endo:no diabetes, + thyroid disease ?All other ROS reviewed and negative.    ? ?Physical Exam/Data:  ? ?Vitals:  ? 02/16/22 1458 02/16/22 1507 02/16/22 1523 02/16/22 1527  ?BP: (!) 87/53 126/83 121/88   ?Pulse:      ?Resp:  (!) 21 (!) 25   ?Temp:      ?TempSrc:      ?SpO2:    99%  ? ?No intake or output data in the 24 hours ending 02/16/22 1615 ? ?  02/02/2022  ?  8:53 AM 01/24/2022  ?  5:01 AM 01/23/2022  ?  4:43 AM  ?Last 3 Weights  ?Weight (lbs) 136 lb 9.6 oz 143 lb 1.3 oz 142 lb 10.2 oz  ?Weight (kg) 61.961 kg 64.9  kg 64.7 kg  ?   ?There is no height or weight on file to calculate BMI.  ?General:  Well nourished, well developed, in no acute distress though dyspnea with exertion ?HEENT: normal ?Neck: no JVD ?Vascular: No car

## 2022-02-16 NOTE — H&P (Signed)
?History and Physical  ? ? ?Carrie [name] FWY:[id number] DOB: 1941-05-21 DOA: 02/16/2022 ? ?PCP: [name]  ?Patient coming from: Home ? ?I have personally briefly reviewed patient's old medical records available.  ? ?Chief Complaint: Weakness and fatigue ? ?HPI: Carrie [name] is a 81 y.o. female with medical history significant of paroxysmal A-fib with frequent exacerbations, improved ejection fraction, coronary artery disease, follicular lymphoma on rituximab presented to the hospital with about 2 days of being weak with prolonged history of weakness and fatigue.  Patient also suffers from chronic cough which is after her COVID-19 infection.  Patient is started having diarrhea, 3-4 loose bowel movement over the weekend which is already slowing down.  There were loose and watery.  She started feeling bad since then.  Denies any nausea or vomiting or abdominal pain or cramps.  Patient feels short of breath but she attributes this to fatigue and weakness.  Denies any chest pain or palpitations.  Denies any dizziness or lightheadedness.  Denies any syncopal episodes.  Does not use oxygen at home.  Denies any fever or chills.  Denies any change in urinary habits or dysuria.  She only had 1 loose bowel movement today after coming to the hospital and that was a small volume. ?Recently multiple admissions with heart failure, rapid A-fib, postural hypotension. ?ED Course: Initial blood pressure 87/53 with heart rate of 130.  Pancytopenic.  Potassium 3.2.  On 1 to 2 L of oxygen with feeling of shortness of breath.  Chest x-ray with bilateral pulmonary congestion which is comparable to previous x-rays.  Creatinine 1.34.  EKG with rapid A-fib.  UA abnormal. ?Patient received 500 mL normal saline, 1 dose of Rocephin and started on amiodarone infusion in the ER after discussing with cardiology. ? ?Review of Systems: all systems are reviewed and pertinent positive as per HPI otherwise rest are negative.  ? ? ?Past  Medical History:  ?Diagnosis Date  ? A-fib ([place])   ? Anemia   ? pt denies   ? Atrial fibrillation with rapid ventricular response ([place]) 06/25/2015  ? Carpal tunnel syndrome, bilateral   ? pt denies   ? Cervical spondylosis without myelopathy 10/25/2013  ? Dental bridge present   ? DJD (degenerative joint disease)   ? Dyspnea   ? sometimes hx of cancer non hodgkins lymphoma   ? Dysrhythmia   ? WENT INTO A FIB IN 2017   ? Elevated troponin 02/18/2015  ? Follicular lymphoma grade 3a ([place]) 02/03/2015  ? hx of nonhodgkins lymphoma x 3   ? Headache   ? sometimes a headache   ? History of echocardiogram   ? Echo 12/16: EF 60-65%, no RWMA, severe LAE  ? History of nuclear stress test   ? Myoview 1/17: EF 55%, Normal study. No ischemia or scar.  ? Hypertension   ? Hypothyroid   ? Nausea without vomiting 06/20/2015  ? Stage III chronic kidney disease ([place]) 07/01/2015  ? Thrush of mouth and esophagus ([place]) 06/25/2015  ? ? ?Past Surgical History:  ?Procedure Laterality Date  ? ABDOMINAL HYSTERECTOMY    ? APPENDECTOMY    ? BACK SURGERY    ? X5-lumbar-fusion  ? COLONOSCOPY    ? DILATION AND CURETTAGE OF UTERUS    ? LYMPH NODE BIOPSY Right 01/21/2015  ? Procedure: RIGHT GROIN LYMPH NODE BIOPSY;  Surgeon: Erroll Luna, MD;  Location: [facility];  Service: General;  Laterality: Right;  ? MASS EXCISION Left 08/29/2018  ?  Procedure: EXCISION LEFT BACK  MASS;  Surgeon: Erroll Luna, MD;  Location: [facility];  Service: General;  Laterality: Left;  ? Ovarian cyst resection    ? patelar tendon transplants    ? Left/right  ? PORTACATH PLACEMENT Right 02/13/2015  ? Procedure: INSERTION PORT-A-CATH WITH ULTRASOUND;  Surgeon: Erroll Luna, MD;  Location: [facility];  Service: General;  Laterality: Right;  ? PORTACATH PLACEMENT N/A 08/29/2018  ? Procedure: INSERTION PORT-A-CATH WITH ULTRA SOUND ERAS PATHWAY;  Surgeon: Erroll Luna, MD;  Location: [facility];  Service: General;  Laterality: N/A;  ? REVERSE SHOULDER ARTHROPLASTY Left 03/02/2021  ?  Procedure: REVERSE SHOULDER ARTHROPLASTY;  Surgeon: Netta Cedars, MD;  Location: [facility];  Service: Orthopedics;  Laterality: Left;  ? RIGHT/LEFT HEART CATH AND CORONARY ANGIOGRAPHY N/A 01/15/2022  ? Procedure: RIGHT/LEFT HEART CATH AND CORONARY ANGIOGRAPHY;  Surgeon: [name], MD;  Location: [facility];  Service: Cardiovascular;  Laterality: N/A;  ? TOTAL KNEE ARTHROPLASTY  2011  ? Right  ? TUBAL LIGATION    ? ? ?Social history  ? reports that she has never smoked. She has never used smokeless tobacco. She reports that she does not currently use alcohol. She reports that she does not use drugs. ? ?Allergies  ?Allergen Reactions  ? Amiodarone Other (See Comments)  ?  Hyperthyroidism   ? ? ?Family History  ?Problem Relation Age of Onset  ? Cancer Mother   ?     Breast, lung NHL  ? Cancer Sister   ?     Multiple myeloma  ? ? ? ?Prior to Admission medications   ?Medication Sig Start Date End Date Taking? Authorizing Provider  ?acetaminophen (TYLENOL) 500 MG tablet Take 500 mg by mouth every 6 (six) hours as needed for moderate pain or headache.    [provider]  ?amiodarone (PACERONE) 200 MG tablet Take 1 tablet (200 mg total) by mouth daily. 02/02/22   [name] T, PA-C  ?amoxicillin (AMOXIL) 500 MG capsule Take 2,000 mg by mouth once. Take 30-60 minutes before dental appointment 06/30/21   [provider]  ?apixaban (ELIQUIS) 5 MG TABS tablet Take 5 mg by mouth 2 (two) times daily.     [provider]  ?levothyroxine (SYNTHROID) 88 MCG tablet Take 1 tablet (88 mcg total) by mouth daily before breakfast. 01/24/22   Lavina Hamman, MD  ?lidocaine-prilocaine (EMLA) cream Apply 1 application topically daily as needed. ?Patient taking differently: Apply 1 application. topically daily as needed (For port). 04/07/21   [name], MD  ?polyvinyl alcohol (LIQUIFILM TEARS) 1.4 % ophthalmic solution Place 1 drop into both eyes daily as needed for dry eyes.    [provider]   ? ? ?Physical Exam: ?Vitals:  ? 02/16/22 1615 02/16/22 1645 02/16/22 1700 02/16/22 1730  ?BP: 113/76 114/84 119/82 100/72  ?Pulse:  98 93   ?Resp: (!) 25 (!) 24 (!) 25 (!) 26  ?Temp:      ?TempSrc:      ?SpO2: 94% 94% 98%   ? ? ?Constitutional: NAD, calm, comfortable.  Frail and debilitated.  Slightly anxious. ?Vitals:  ? 02/16/22 1615 02/16/22 1645 02/16/22 1700 02/16/22 1730  ?BP: 113/76 114/84 119/82 100/72  ?Pulse:  98 93   ?Resp: (!) 25 (!) 24 (!) 25 (!) 26  ?Temp:      ?TempSrc:      ?SpO2: 94% 94% 98%   ? ?Eyes: PERRL, lids and conjunctivae normal ?ENMT: Mucous membranes are dry.  Posterior pharynx clear of any exudate or lesions.Normal dentition.  ?Neck: normal, supple, no masses, no thyromegaly ?Respiratory: clear to auscultation bilaterally, no wheezing, no crackles. Normal respiratory effort. No accessory muscle use.  No added sounds.  Some conducted upper airway sounds. ?Cardiovascular: Irregularly irregular, no murmurs / rubs / gallops. No extremity edema. 2+ pedal pulses. No carotid bruits.  ?Abdomen: no tenderness, no masses palpated. No hepatosplenomegaly. Bowel sounds positive.  ?Musculoskeletal: no clubbing / cyanosis. No joint deformity upper and lower extremities. Good ROM, no contractures. Normal muscle tone.  ?Skin: no rashes, lesions, ulcers. No induration ?Neurologic: CN 2-12 grossly intact. Sensation intact, DTR normal. Strength 5/5 in all 4.  ?Psychiatric: Normal judgment and insight. Alert and oriented x 3. Normal mood.  ? ? ? ?Labs on Admission: I have personally reviewed following labs and imaging studies ? ?CBC: ?Recent Labs  ?Lab 02/16/22 ?1441  ?WBC 1.6*  ?HGB 9.6*  ?HCT 29.8*  ?MCV 90.6  ?PLT 274  ? ?Basic Metabolic Panel: ?Recent Labs  ?Lab 02/16/22 ?1441  ?NA 132*  ?K 3.2*  ?CL 97*  ?CO2 25  ?GLUCOSE 113*  ?BUN 16  ?CREATININE 1.34*  ?CALCIUM 8.2*  ? ?GFR: ?CrCl cannot be calculated (Unknown ideal weight.). ?Liver Function Tests: ?Recent Labs  ?Lab 02/16/22 ?1441  ?AST 48*  ?ALT  45*  ?ALKPHOS 73  ?BILITOT 0.9  ?PROT 5.7*  ?ALBUMIN 2.9*  ? ?No results for input(s): LIPASE, AMYLASE in the last 168 hours. ?No results for input(s): AMMONIA in the last 168 hours. ?Coagulation Profi

## 2022-02-16 NOTE — ED Provider Notes (Signed)
?[facility] ?Provider Note ? ? ?CSN: [id number] ?Arrival date & time: 02/16/22  1410 ? ?  ? ?History ? ?Chief Complaint  ?Patient presents with  ? Atrial Fibrillation  ? Shortness of Breath  ? Chest Pain  ? ? ?Carrie [name] is a 81 y.o. female. ? ?81 year old female with prior medical history as detailed below presents for evaluation.  Patient reports 2 days of loose stools.  She denies bleeding.  She reports associated nausea.  She denies vomiting.  She feels that she may have "stomach bug". ? ?Patient has had history of paroxysmal A-fib.  She is on Eliquis.  She reports that today she felt that enough to come to the ED for evaluation. ? ?She is noted to be in A-fib on initial triage evaluation.  Patient with transient episode of hypotension while in triage with associated wave of nausea. ? ?Upon evaluation the room she feels improved.  Heart rate on monitor is approximately 100-110. ? ?Primary complaint upon evaluation is nausea and associated weakness.  She denies palpitations or chest pain. ? ?The history is provided by the patient and medical records.  ?Illness ?Location:  Diarrhea, weakness, nausea ?Severity:  Mild ?Onset quality:  Gradual ?Duration:  2 days ?Timing:  Unable to specify ?Progression:  Waxing and waning ?Chronicity:  New ? ?  ? ?Home Medications ?Prior to Admission medications   ?Medication Sig Start Date End Date Taking? Authorizing Provider  ?acetaminophen (TYLENOL) 500 MG tablet Take 500 mg by mouth every 6 (six) hours as needed for moderate pain or headache.    [provider]  ?amiodarone (PACERONE) 200 MG tablet Take 1 tablet (200 mg total) by mouth daily. 02/02/22   [name] T, PA-C  ?amoxicillin (AMOXIL) 500 MG capsule Take 2,000 mg by mouth once. Take 30-60 minutes before dental appointment 06/30/21   [provider]  ?apixaban (ELIQUIS) 5 MG TABS tablet Take 5 mg by mouth 2 (two) times daily.     [provider]   ?levothyroxine (SYNTHROID) 88 MCG tablet Take 1 tablet (88 mcg total) by mouth daily before breakfast. 01/24/22   Lavina Hamman, MD  ?lidocaine-prilocaine (EMLA) cream Apply 1 application topically daily as needed. ?Patient taking differently: Apply 1 application. topically daily as needed (For port). 04/07/21   [name], MD  ?polyvinyl alcohol (LIQUIFILM TEARS) 1.4 % ophthalmic solution Place 1 drop into both eyes daily as needed for dry eyes.    [provider]  ?   ? ?Allergies    ?Amiodarone   ? ?Review of Systems   ?Review of Systems  ?All other systems reviewed and are negative. ? ?Physical Exam ?Updated Vital Signs ?BP 121/88   Pulse (!) 50   Temp 98.4 ?F (36.9 ?C) (Oral)   Resp (!) 25   SpO2 99%  ?Physical Exam ?Vitals and nursing note reviewed.  ?Constitutional:   ?   General: She is not in acute distress. ?   Appearance: Normal appearance. She is well-developed.  ?HENT:  ?   Head: Normocephalic and atraumatic.  ?Eyes:  ?   Conjunctiva/sclera: Conjunctivae normal.  ?   Pupils: Pupils are equal, round, and reactive to light.  ?Cardiovascular:  ?   Rate and Rhythm: Tachycardia present. Rhythm irregular.  ?   Heart sounds: Normal heart sounds.  ?Pulmonary:  ?   Effort: Pulmonary effort is normal. No respiratory distress.  ?   Breath sounds: Normal breath sounds.  ?Abdominal:  ?  General: There is no distension.  ?   Palpations: Abdomen is soft.  ?   Tenderness: There is no abdominal tenderness.  ?Musculoskeletal:     ?   General: No deformity. Normal range of motion.  ?   Cervical back: Normal range of motion and neck supple.  ?Skin: ?   General: Skin is warm and dry.  ?Neurological:  ?   General: No focal deficit present.  ?   Mental Status: She is alert and oriented to person, place, and time.  ? ? ?ED Results / Procedures / Treatments   ?Labs ?(all labs ordered are listed, but only abnormal results are displayed) ?Labs Reviewed  ?CBC - Abnormal; Notable for the following components:  ?     Result Value  ? WBC 1.6 (*)   ? RBC 3.29 (*)   ? Hemoglobin 9.6 (*)   ? HCT 29.8 (*)   ? All other components within normal limits  ?COMPREHENSIVE METABOLIC PANEL  ?BRAIN NATRIURETIC PEPTIDE  ?URINALYSIS, ROUTINE W REFLEX MICROSCOPIC  ?POC OCCULT BLOOD, ED  ?TROPONIN I (HIGH SENSITIVITY)  ? ? ?EKG ?EKG Interpretation ? ?Date/Time:  Tuesday Feb 16 2022 15:09:03 EDT ?Ventricular Rate:  123 ?PR Interval:    ?QRS Duration: 91 ?QT Interval:  350 ?QTC Calculation: 501 ?R Axis:   -8 ?Text Interpretation: Atrial fibrillation Paired ventricular premature complexes Borderline repolarization abnormality Prolonged QT interval Confirmed by Dene Gentry [phone]) on 02/16/2022 3:10:44 PM ? ?Radiology ?DG Chest 1 View ? ?Result Date: 02/16/2022 ?CLINICAL DATA:  Shortness of breath. EXAM: CHEST  1 VIEW COMPARISON:  01/21/2022 FINDINGS: The right IJ power port is stable. Stable borderline cardiac enlargement. Low lung volumes with right greater than left vascular crowding and streaky atelectasis. No definite infiltrates or effusions. No pneumothorax. IMPRESSION: Low lung volumes with vascular crowding and atelectasis, more pronounced on right. Electronically Signed   By: Marijo Sanes M.D.   On: 02/16/2022 15:21   ? ?Procedures ?Procedures  ? ? ?Medications Ordered in ED ?Medications  ?sodium chloride 0.9 % bolus 500 mL (500 mLs Intravenous New Bag/Given 02/16/22 1539)  ?ondansetron (ZOFRAN) injection 4 mg (4 mg Intravenous Given 02/16/22 1538)  ? ? ?ED Course/ Medical Decision Making/ A&P ?  ?                        ?Medical Decision Making ?Amount and/or Complexity of Data Reviewed ?Labs: ordered. ? ?Risk ?Prescription drug management. ? ? ? ?Medical Screen Complete ? ?This patient presented to the ED with complaint of weakness, nausea. ? ?This complaint involves an extensive number of treatment options. The initial differential diagnosis includes, but is not limited to, metabolic abnormality, dehydration, AKI, ACS, atrial fibrillation  with RVR, etc. ? ?This presentation is: Acute, Chronic, Self-Limited, Previously Undiagnosed, Uncertain Prognosis, Complicated, Systemic Symptoms, and Threat to Life/Bodily Function ? ?Patient is presenting with complaint of generalized weakness and nausea.  Patient reports loose stools over the last 2 to 3 days ? ?Patient is noted to be transiently hypotensive in the ED triage.  This was associated with a wave of nausea. ? ?Upon evaluation in the ED exam room she is mildly tachycardic into the 100-110 range.  Blood pressure is improved. ? ?Stool guaiac is negative. ? ?Gentle IV fluids given in the ED. Exam is not consistent with CHF. ? ?Cardiology is aware of case and will consult. ? ?Patient with history of accessible A-fib did not respond well to diltiazem.  Amiodarone  has been used previously.  Patient does have history of cardioversion in the past.  She is not excited about the idea of cardioversion today.  She does have a DNR code status. ? ?UA is concerning for possible UTI. ? ?Rocephin administered. ? ?Patient reports feeling improved with gentle IVF hydration. ? ?Patient would benefit from admission for further treatment and observation. ? ?Hospitalist service is aware of case and will admit. ? ? ?Co morbidities that complicated the patient's evaluation ? ?History of paroxysmal A-fib ? ? ?Additional history obtained: ? ?External records from outside sources obtained and reviewed including prior ED visits and prior Inpatient records.  ? ? ?Lab Tests: ? ?I ordered and personally interpreted labs.  The pertinent results include: CBC, CMP, BNP, troponin, UA, stool guaiac ? ? ?Imaging Studies ordered: ? ?I ordered imaging studies including chest x-ray ?I independently visualized and interpreted obtained imaging which showed NAD ?I agree with the radiologist interpretation. ? ? ?Cardiac Monitoring: ? ?The patient was maintained on a cardiac monitor.  I personally viewed and interpreted the cardiac monitor which  showed an underlying rhythm of: A-fib with rate in the 100-110 range ? ? ?Medicines ordered: ? ?I ordered medication including IV fluids for suspected dehydration ?Reevaluation of the patient after these medicines

## 2022-02-16 NOTE — ED Notes (Signed)
MD at bedside. 

## 2022-02-16 NOTE — ED Provider Triage Note (Signed)
Emergency Medicine Provider Triage Evaluation Note ? ?Carrie [name] , a 81 y.o. female  was evaluated in triage.  Pt complains of weakness and A-fib.  Increased work of breathing, feels weak, unable to catch her breath.  Hx of paroxysmal A-fib.  Has not been to A-fib clinic in about a month.  Hx also of non-Hodgkin's lymphoma, for which she stopped treatment a few months ago. ? ?Review of Systems  ?Positive: As above ?Negative: As above ? ?Physical Exam  ?BP 102/70 (BP Location: Left Arm)   Pulse (!) 50   Temp 98.4 ?F (36.9 ?C) (Oral)   Resp 20   SpO2 93%  ?Gen:   Awake, ill-appearing ?Resp:  Increased respiratory effort, tachypneic ?MSK:   Moves extremities with some difficulty ?Other:  Mildly diaphoretic, heart rate varies between 90 to 160 bpm. ? ?Medical Decision Making  ?Medically screening exam initiated at 2:34 PM.  Appropriate orders placed.  Carrie [name] was informed that the remainder of the evaluation will be completed by another provider, this initial triage assessment does not replace that evaluation, and the importance of remaining in the ED until their evaluation is complete. ? ?Labs, imaging, EKG ?  ?Carrie Rome, PA-C ?29/24/46 1437 ? ?

## 2022-02-17 ENCOUNTER — Observation Stay (HOSPITAL_BASED_OUTPATIENT_CLINIC_OR_DEPARTMENT_OTHER): Payer: No Typology Code available for payment source | Admitting: Certified Registered Nurse Anesthetist

## 2022-02-17 ENCOUNTER — Observation Stay (HOSPITAL_COMMUNITY): Payer: No Typology Code available for payment source | Admitting: Certified Registered Nurse Anesthetist

## 2022-02-17 ENCOUNTER — Encounter (HOSPITAL_COMMUNITY)
Admission: EM | Disposition: A | Discharge status: Home / Self Care | Payer: Self-pay | Source: Home / Self Care | Attending: Emergency Medicine

## 2022-02-17 DIAGNOSIS — I4891 Unspecified atrial fibrillation: Secondary | ICD-10-CM | POA: Diagnosis not present

## 2022-02-17 DIAGNOSIS — E039 Hypothyroidism, unspecified: Secondary | ICD-10-CM | POA: Diagnosis not present

## 2022-02-17 DIAGNOSIS — I5033 Acute on chronic diastolic (congestive) heart failure: Secondary | ICD-10-CM

## 2022-02-17 DIAGNOSIS — I2510 Atherosclerotic heart disease of native coronary artery without angina pectoris: Secondary | ICD-10-CM | POA: Diagnosis not present

## 2022-02-17 DIAGNOSIS — I480 Paroxysmal atrial fibrillation: Secondary | ICD-10-CM | POA: Diagnosis not present

## 2022-02-17 DIAGNOSIS — R8271 Bacteriuria: Secondary | ICD-10-CM

## 2022-02-17 DIAGNOSIS — Z7901 Long term (current) use of anticoagulants: Secondary | ICD-10-CM | POA: Diagnosis not present

## 2022-02-17 DIAGNOSIS — Z8616 Personal history of COVID-19: Secondary | ICD-10-CM | POA: Diagnosis not present

## 2022-02-17 DIAGNOSIS — E119 Type 2 diabetes mellitus without complications: Secondary | ICD-10-CM | POA: Diagnosis not present

## 2022-02-17 DIAGNOSIS — D649 Anemia, unspecified: Secondary | ICD-10-CM

## 2022-02-17 DIAGNOSIS — R197 Diarrhea, unspecified: Secondary | ICD-10-CM

## 2022-02-17 DIAGNOSIS — I10 Essential (primary) hypertension: Secondary | ICD-10-CM | POA: Diagnosis not present

## 2022-02-17 HISTORY — PX: CARDIOVERSION: SHX1299

## 2022-02-17 LAB — CBC
HCT: 29.2 % — ABNORMAL LOW (ref 36.0–46.0)
Hemoglobin: 9.5 g/dL — ABNORMAL LOW (ref 12.0–15.0)
MCH: 29.8 pg (ref 26.0–34.0)
MCHC: 32.5 g/dL (ref 30.0–36.0)
MCV: 91.5 fL (ref 80.0–100.0)
Platelets: 234 10*3/uL (ref 150–400)
RBC: 3.19 MIL/uL — ABNORMAL LOW (ref 3.87–5.11)
RDW: 13.8 % (ref 11.5–15.5)
WBC: 1.7 10*3/uL — ABNORMAL LOW (ref 4.0–10.5)
nRBC: 0 % (ref 0.0–0.2)

## 2022-02-17 LAB — BASIC METABOLIC PANEL
Anion gap: 8 (ref 5–15)
BUN: 14 mg/dL (ref 8–23)
CO2: 23 mmol/L (ref 22–32)
Calcium: 7.7 mg/dL — ABNORMAL LOW (ref 8.9–10.3)
Chloride: 102 mmol/L (ref 98–111)
Creatinine, Ser: 1.24 mg/dL — ABNORMAL HIGH (ref 0.44–1.00)
GFR, Estimated: 44 mL/min — ABNORMAL LOW (ref >60)
Glucose, Bld: 115 mg/dL — ABNORMAL HIGH (ref 70–99)
Potassium: 4 mmol/L (ref 3.5–5.1)
Sodium: 133 mmol/L — ABNORMAL LOW (ref 135–145)

## 2022-02-17 LAB — MAGNESIUM: Magnesium: 1.7 mg/dL (ref 1.7–2.4)

## 2022-02-17 SURGERY — CARDIOVERSION
Anesthesia: General

## 2022-02-17 MED ORDER — ONDANSETRON HCL 4 MG PO TABS: 4.0000 mg | ORAL_TABLET | Freq: Four times a day (QID) | ORAL | Status: DC | PRN

## 2022-02-17 MED ORDER — ACETAMINOPHEN 650 MG RE SUPP: 650.0000 mg | Freq: Four times a day (QID) | RECTAL | Status: DC | PRN

## 2022-02-17 MED ORDER — LEVOTHYROXINE SODIUM 88 MCG PO TABS
88.0000 ug | ORAL_TABLET | Freq: Every day | ORAL | Status: DC
  Administered 2022-02-17 – 2022-02-18 (×2): 88 ug via ORAL
  Filled 2022-02-17 (×3): qty 1

## 2022-02-17 MED ORDER — ONDANSETRON HCL 4 MG/2ML IJ SOLN: 4.0000 mg | Freq: Four times a day (QID) | INTRAMUSCULAR | Status: DC | PRN

## 2022-02-17 MED ORDER — PROPOFOL 10 MG/ML IV BOLUS
INTRAVENOUS | Status: DC | PRN
  Administered 2022-02-17: 40 mg via INTRAVENOUS

## 2022-02-17 MED ORDER — POLYVINYL ALCOHOL 1.4 % OP SOLN: 1.0000 [drp] | Freq: Every day | OPHTHALMIC | Status: DC | PRN

## 2022-02-17 MED ORDER — ACETAMINOPHEN 325 MG PO TABS: 650.0000 mg | ORAL_TABLET | Freq: Four times a day (QID) | ORAL | Status: DC | PRN

## 2022-02-17 MED ORDER — SODIUM CHLORIDE 0.9 % IV SOLN: INTRAVENOUS | Status: DC

## 2022-02-17 MED ORDER — MAGNESIUM SULFATE IN D5W 1-5 GM/100ML-% IV SOLN
1.0000 g | Freq: Once | INTRAVENOUS | Status: AC
  Administered 2022-02-18: 1 g via INTRAVENOUS
  Filled 2022-02-17: qty 100

## 2022-02-17 MED ORDER — APIXABAN 5 MG PO TABS
5.0000 mg | ORAL_TABLET | Freq: Two times a day (BID) | ORAL | Status: DC
  Administered 2022-02-17 – 2022-02-18 (×4): 5 mg via ORAL
  Filled 2022-02-17 (×4): qty 1

## 2022-02-17 MED ORDER — LIDOCAINE 2% (20 MG/ML) 5 ML SYRINGE
INTRAMUSCULAR | Status: DC | PRN
  Administered 2022-02-17: 60 mg via INTRAVENOUS

## 2022-02-17 NOTE — Progress Notes (Signed)
?PROGRESS NOTE ?  ?Carrie [name]  FTD:[id number]    DOB: 1941-10-03    DOA: 02/16/2022 ? ?PCP: [name]  ? ?I have briefly reviewed patients previous medical records in [facility]. ? ?Chief Complaint  ?Patient presents with  ? Atrial Fibrillation  ? Shortness of Breath  ? Chest Pain  ? ? ?Hospital Course:  ?81 year old female, lives alone, independent, medical history significant for paroxysmal A-fib, CAD, DCM felt to be due to A-fib, EF improved, follicular lymphoma post chemo, HTN, hypothyroid, chronic cough after COVID-19 infection, presented to the ED on 02/16/2022 with complaints of diarrhea over the weekend prior after?  Eating some outside food, and generalized weakness.  Initial BP 87/53 and heart rates in the 130s.  Admitted for A-fib with RVR and fatigue and weakness related to same and GI losses.  Cardiology consulted.  Initiated on amiodarone drip.  Cardiology considering DCCV. ? ?Assessment and Plan: ?* Atrial fibrillation with RVR ([place]) ?On amiodarone and Eliquis at home.  Has not missed doses. ?Presented with RVR in the 130s ?Rapid A-fib likely precipitated by acute GE, dehydration.  She does not have a clinical UTI. ?TSH 2.529.  Free T4 mildly elevated at 1.39.  Recommend repeating full TFTs in 4 to 6 weeks. ?Cardiology consultation appreciated, on amiodarone drip to try and restore sinus rhythm with chemical cardioversion and if not then considering DCCV. ?CHA2DS2-VASc score: 6. ?Rate better controlled, in the 90s-100s on telemetry this morning and in atrial flutter. ? ?Diarrhea ?Suspect due to food poisoning or acute viral GE. ?Resolved.  Monitor. ? ?Asymptomatic bacteriuria ?Confirmed with patient that she absolutely does not have any UTI symptoms. ?Discontinue empirically started ceftriaxone and monitor. ? ?Acquired hypercoagulable state ([place]) ?Due to A-fib ?Continue Eliquis. ? ?Essential hypertension ?Controlled. ? ?Stage 3a chronic kidney disease ([place]) ?Creatinine has  fluctuated some but appears to be at baseline. ? ?Hypothyroidism ?TFT results as above. ?Recommend repeating TFTs in 4 to 6 weeks. ?Outpatient follow-up. ?Continue Synthroid dose. ? ?Leukopenia ?Appears to be chronic but somewhat worse compared to numbers from early April 2023. ?Follow CBC ? ?Chronic anemia ?Slight drop but mostly stable. ?Follow CBC. ? ?Hypokalemia ?Resolved after repletion.  We will recheck magnesium and replace as needed. ? ? ?There is no height or weight on file to calculate BMI. ? ? ?DVT prophylaxis:   Apixaban ?  Code Status: DNR:  ?Family Communication: None at bedside ?Disposition:  ?Status is: Observation ?The patient remains OBS appropriate and will d/c before 2 midnights. ?  ? ?Consultants:   ?Cardiology ? ?Procedures:   ? ? ?Antimicrobials:   ?Ceftriaxone-discontinued 5/3 ? ? ?Subjective:  ?Seen this morning while still in the ED.  Lives alone, independent.  Not on home oxygen.  No BM since yesterday.  No nausea, vomiting or abdominal pain.  Weakness is better but not completely resolved.  Occasional dyspnea, chronic.  No cough. ? ?Objective:  ? ?Vitals:  ? 02/17/22 0515 02/17/22 0600 02/17/22 0715 02/17/22 0738  ?BP: 111/85 125/83 105/73   ?Pulse: (!) 28 79 89   ?Resp: (!) 25 15 (!) 26 (!) 25  ?Temp:    98.2 ?F (36.8 ?C)  ?TempSrc:    Oral  ?SpO2: 95% 99% 95% 92%  ? ? ?General exam: Elderly female, moderately built and thinly nourished, lying comfortably supine in bed.  Oral mucosa moist. ?Respiratory system: Clear to auscultation except few right basal crackles. Respiratory effort normal. ?Cardiovascular system: S1 & S2 heard, irregularly irregular. No JVD,  murmurs, rubs, gallops or clicks. No pedal edema.  Telemetry personally reviewed, atrial flutter with variable rate between 90s-100s. ?Gastrointestinal system: Abdomen is nondistended, soft and nontender. No organomegaly or masses felt. Normal bowel sounds heard. ?Central nervous system: Alert and oriented. No focal neurological  deficits. ?Extremities: Symmetric 5 x 5 power. ?Skin: No rashes, lesions or ulcers ?Psychiatry: Judgement and insight appear normal. Mood & affect appropriate.  ? ?Data Reviewed:   ?I have personally reviewed following labs and imaging studies ? ?CBC: ?Recent Labs  ?Lab 02/16/22 ?1441 02/17/22 ?0328  ?WBC 1.6* 1.7*  ?HGB 9.6* 9.5*  ?HCT 29.8* 29.2*  ?MCV 90.6 91.5  ?PLT 274 234  ? ?Basic Metabolic Panel: ?Recent Labs  ?Lab 02/16/22 ?1441 02/16/22 ?2002 02/17/22 ?0328  ?NA 132*  --  133*  ?K 3.2*  --  4.0  ?CL 97*  --  102  ?CO2 25  --  23  ?GLUCOSE 113*  --  115*  ?BUN 16  --  14  ?CREATININE 1.34*  --  1.24*  ?CALCIUM 8.2*  --  7.7*  ?MG  --  1.6*  --   ?PHOS  --  2.7  --   ? ?Liver Function Tests: ?Recent Labs  ?Lab 02/16/22 ?1441  ?AST 48*  ?ALT 45*  ?ALKPHOS 73  ?BILITOT 0.9  ?PROT 5.7*  ?ALBUMIN 2.9*  ? ?CBG: ?No results for input(s): GLUCAP in the last 168 hours. ?Microbiology Studies:  ?No results found for this or any previous visit (from the past 240 hour(s)). ? ?Radiology Studies:  ?DG Chest 1 View ? ?Result Date: 02/16/2022 ?CLINICAL DATA:  Shortness of breath. EXAM: CHEST  1 VIEW COMPARISON:  01/21/2022 FINDINGS: The right IJ power port is stable. Stable borderline cardiac enlargement. Low lung volumes with right greater than left vascular crowding and streaky atelectasis. No definite infiltrates or effusions. No pneumothorax. IMPRESSION: Low lung volumes with vascular crowding and atelectasis, more pronounced on right. Electronically Signed   By: Marijo Sanes M.D.   On: 02/16/2022 15:21   ? ?Scheduled Meds:  ? ? apixaban  5 mg Oral BID  ? levothyroxine  88 mcg Oral Q0600  ? potassium chloride  40 mEq Oral BID  ? ? ?Continuous Infusions:  ? ? sodium chloride 75 mL/hr at 02/16/22 2250  ? amiodarone 30 mg/hr (02/16/22 2249)  ? ? ? LOS: 0 days  ? ?Vernell Leep, MD,  ?FACP, [facility], [facility], [facility] (Care Management Physician Certified) ?[facility]ician Advisor ?[place] ? ?To contact the attending  provider between 7A-7P or the covering provider during after hours 7P-7A, please log into the web site [email or web address] and access using universal [place] password for that web site. If you do not have the password, please call the hospital operator. ? ?02/17/2022, 8:23 AM   ?

## 2022-02-17 NOTE — Assessment & Plan Note (Signed)
TFT results as above. ?Recommend repeating TFTs in 4 to 6 weeks. ?Outpatient follow-up. ?Continue Synthroid dose. ?

## 2022-02-17 NOTE — Assessment & Plan Note (Addendum)
Confirmed with patient that she absolutely does not have any UTI symptoms. ?She received a dose of ceftriaxone which was not continued thereafter. ?

## 2022-02-17 NOTE — Assessment & Plan Note (Signed)
Due to A-fib ?Continue Eliquis. ?

## 2022-02-17 NOTE — Assessment & Plan Note (Addendum)
Appears to be chronic but somewhat worse compared to numbers from early April 2023. ?WBC count down to 1.4.  Recommended hematology consultation while in-house but patient insists on being discharged and agrees to close outpatient follow-up. ?Discussed with hematology MD on-call who indicates that she would need a referral from her Okay MD for them to see her in the office and arrange an early follow-up. ?Advised patient to follow-up with her PCP at the [facility] closely for early hematology consultation. ?As per my discussion with the hematologist here, upon reviewing of records, this appears to be a chronic intermittent issue for her.  In the absence of infection, she could be followed up outpatient. ?No clinical concern for infection at this point.  Patient advised to seek immediate medical attention if she develops any symptoms or signs of infection including fever. ?Her worsening leukopenia may be related to possible infectious diarrhea. ?

## 2022-02-17 NOTE — Assessment & Plan Note (Signed)
Controlled.  

## 2022-02-17 NOTE — CV Procedure (Signed)
? ?  Electrical Cardioversion Procedure Note ?Carrie [name] ?[id number] ?03/04/1941 ? ?Procedure: Electrical Cardioversion ?Indications:  Atrial Fibrillation ? ?Time Out: Verified patient identification, verified procedure,medications/allergies/relevent history reviewed, required imaging and test results available.  Performed ? ?Procedure Details ? ?The patient signed informed consent. The patient was NPO past midnight. Has had therapeutic anticoagulation with Eliquis greater than 3 weeks. The patient denies any interruption of anticoagulation.  Anesthesia was administered by Dr. Daiva Huge.  Adequate airway was maintained throughout and vital followed per protocol.  He was cardioverted x 1 with 200 J of biphasic synchronized energy.  He converted to NSR.  There were no apparent complications.  The patient tolerated the procedure well and had normal neuro status and respiratory status post procedure with vitals stable as recorded elsewhere.   ? ? ?IMPRESSION: ? ?Successful cardioversion of atrial fibrillation ? ? ?Follow up:  Transfer back to medical floors. Primary team notified. ? ?Berniece Salines ?02/17/2022, 1:36 PM ?  ?

## 2022-02-17 NOTE — Progress Notes (Signed)
Pt states she had a "couple drinks of water" at 1115 before coming to endo unit.  Dr Harriet Masson aware.  Per Dr Daiva Huge, procedure cannot happen until 1315. ?

## 2022-02-17 NOTE — Interval H&P Note (Signed)
History and Physical Interval Note: ? ?02/17/2022 ?11:34 AM ? ?Carrie [name]  has presented today for surgery, with the diagnosis of a flutter.  The various methods of treatment have been discussed with the patient and family. After consideration of risks, benefits and other options for treatment, the patient has consented to  Procedure(s): ?CARDIOVERSION (N/A) as a surgical intervention.  The patient's history has been reviewed, patient examined, no change in status, stable for surgery.  I have reviewed the patient's chart and labs.  Questions were answered to the patient's satisfaction.   ? ? ?Godfrey Pick Nikita Humble ? ? ?

## 2022-02-17 NOTE — Anesthesia Preprocedure Evaluation (Addendum)
Anesthesia Evaluation  ?Patient identified by MRN, date of birth, ID band ?Patient awake ? ? ? ?Reviewed: ?Allergy & Precautions, NPO status , Patient's Chart, lab work & pertinent test results ? ?History of Anesthesia Complications ?Negative for: history of anesthetic complications ? ?Airway ?Mallampati: II ? ?TM Distance: >3 FB ?Neck ROM: Full ? ? ? Dental ?no notable dental hx. ? ?  ?Pulmonary ?neg pulmonary ROS,  ?  ?Pulmonary exam normal ? ? ? ? ? ? ? Cardiovascular ?hypertension, Normal cardiovascular exam+ dysrhythmias Atrial Fibrillation  ? ?TTE 12/2021: Inferior basal hypokinesis, EF 50-55%,mild LAE, mild MR, mild to moderate TR, mild AR   ?  ?Neuro/Psych ? Headaches, Anxiety   ? GI/Hepatic ?negative GI ROS, Neg liver ROS,   ?Endo/Other  ?diabetesHypothyroidism  ? Renal/GU ?Renal InsufficiencyRenal disease  ?negative genitourinary ?  ?Musculoskeletal ? ?(+) Arthritis ,  ? Abdominal ?  ?Peds ? Hematology ? ?(+) Blood dyscrasia (Hgb 9.5), anemia ,   ?Anesthesia Other Findings ?Follicular lymphoma ? Reproductive/Obstetrics ?negative OB ROS ? ?  ? ? ? ? ? ? ? ? ? ? ? ? ? ?  ?  ? ? ? ? ? ? ? ?Anesthesia Physical ?Anesthesia Plan ? ?ASA: 3 ? ?Anesthesia Plan: General  ? ?Post-op Pain Management: Minimal or no pain anticipated  ? ?Induction: Intravenous ? ?PONV Risk Score and Plan: Treatment may vary due to age or medical condition and Propofol infusion ? ?Airway Management Planned: Mask ? ?Additional Equipment: None ? ?Intra-op Plan:  ? ?Post-operative Plan:  ? ?Informed Consent: I have reviewed the patients History and Physical, chart, labs and discussed the procedure including the risks, benefits and alternatives for the proposed anesthesia with the patient or authorized representative who has indicated his/her understanding and acceptance.  ? ?Patient has DNR.  ?Discussed DNR with patient and Suspend DNR. ?  ? ? ?Plan Discussed with: CRNA ? ?Anesthesia Plan Comments:    ? ? ? ? ? ?Anesthesia Quick Evaluation ? ?

## 2022-02-17 NOTE — Hospital Course (Addendum)
81 year old female, lives alone, independent, medical history significant for paroxysmal A-fib, CAD, DCM felt to be due to A-fib, EF improved, follicular lymphoma post chemo, HTN, hypothyroid, chronic cough after COVID-19 infection, presented to the ED on 02/16/2022 with complaints of diarrhea over the weekend prior after?  Eating some outside food, and generalized weakness.  Initial BP 87/53 and heart rates in the 130s.  Admitted for A-fib with RVR and fatigue and weakness related to same and GI losses.  Cardiology consulted.  Initiated on amiodarone drip.  S/p DCCV 5/3. ?

## 2022-02-17 NOTE — Transfer of Care (Signed)
Immediate Anesthesia Transfer of Care Note ? ?Patient: Carrie [name] ? ?Procedure(s) Performed: CARDIOVERSION ? ?Patient Location: Endoscopy Unit ? ?Anesthesia Type:General ? ?Level of Consciousness: drowsy ? ?Airway & Oxygen Therapy: Patient Spontanous Breathing, connected to nasal cannula ? ?Post-op Assessment: Report given to RN and Post -op Vital signs reviewed and stable ? ?Post vital signs: Reviewed and stable ? ?Last Vitals:  ?Vitals Value Taken Time  ?BP 142/78  1338 02/17/22  ?Temp    ?Pulse 73 1338 02/17/22  ?Resp 18 1338 02/17/22  ?SpO2 94 1338 02/17/22  ? ? ?Last Pain:  ?Vitals:  ? 02/17/22 1134  ?TempSrc: Temporal  ?PainSc: 0-No pain  ?   ? ?  ? ?Complications: No notable events documented. ?

## 2022-02-17 NOTE — Progress Notes (Signed)
PT Cancellation Note ? ?Patient Details ?Name: Carrie [name] ?MRN: [id number] ?DOB: 1941/03/10 ? ? ?Cancelled Treatment:    Reason Eval/Treat Not Completed: Other (comment);Patient at procedure or test/unavailable.  Pt has cardioversion followed by bedrest, will retry at another time. ? ? ?Ramond Dial ?02/17/2022, 11:30 AM ? ?Mee Hives, PT PhD ?Acute Rehab Dept. Number: [facility] [phone] and [place] [phone] ? ?

## 2022-02-17 NOTE — Assessment & Plan Note (Addendum)
Creatinine has fluctuated some but appears to be at baseline. ?Creatinine down to 1.19 on day of discharge. ?

## 2022-02-17 NOTE — Assessment & Plan Note (Addendum)
On amiodarone and Eliquis at home.  Has not missed doses. ?Presented with RVR in the 130s ?Rapid A-fib likely precipitated by acute GE, dehydration.  She does not have a clinical UTI. ?TSH 2.529.  Free T4 mildly elevated at 1.39.  Recommend repeating full TFTs in 4 to 6 weeks. ?Cardiology consultation appreciated, on amiodarone drip to try and restore sinus rhythm with chemical cardioversion and if not then considering DCCV. ?CHA2DS2-VASc score: 6. ?Underwent DCCV on 5/3 and subsequently maintained sinus rhythm. ?Cardiology followed up today, transitioned to amiodarone as below, continue Eliquis.  They recommended patient stay for IV Lasix given crackles on exam and elevated BNP but patient insists on going home so she will be given a dose of IV Lasix followed by p.o. Lasix x3 days at discharge.  She has a follow-up appointment at the [place] clinic on 5/11.  Cardiology has cleared her for discharge home. ?

## 2022-02-17 NOTE — H&P (View-Only) (Signed)
? ?Progress Note ? ?Patient Name: Carrie [name] ?Date of Encounter: 02/17/2022 ? ?[facility] Cardiologist: [name], MD  ? ?Subjective  ? ?Feeling OK.  Breathing improving.  Currently denies palpitations or chest pain.  ? ?Inpatient Medications  ?  ?Scheduled Meds: ? apixaban  5 mg Oral BID  ? levothyroxine  88 mcg Oral Q0600  ? potassium chloride  40 mEq Oral BID  ? ?Continuous Infusions: ? amiodarone 30 mg/hr (02/16/22 2249)  ? ?PRN Meds: ?acetaminophen **OR** acetaminophen, ondansetron **OR** ondansetron (ZOFRAN) IV, polyvinyl alcohol  ? ?Vital Signs  ?  ?Vitals:  ? 02/17/22 0800 02/17/22 0845 02/17/22 0916 02/17/22 0930  ?BP: 119/84 (!) 118/92  133/90  ?Pulse: 87 77  (!) 109  ?Resp: 20 (!) 28  (!) 22  ?Temp:      ?TempSrc:      ?SpO2: 96% 95%  99%  ?Weight:   62 kg   ?Height:   '5\' 1"'$  (1.549 m)   ? ? ?Intake/Output Summary (Last 24 hours) at 02/17/2022 1006 ?Last data filed at 02/17/2022 0018 ?Gross per 24 hour  ?Intake 1000 ml  ?Output --  ?Net 1000 ml  ? ? ?  02/17/2022  ?  9:16 AM 02/02/2022  ?  8:53 AM 01/24/2022  ?  5:01 AM  ?Last 3 Weights  ?Weight (lbs) 136 lb 11 oz 136 lb 9.6 oz 143 lb 1.3 oz  ?Weight (kg) 62 kg 61.961 kg 64.9 kg  ?   ? ?Telemetry  ?  ?Atrial fibirllation.  Rate 90s-110s - Personally Reviewed ? ?ECG  ?  ?Atrial fibrillation.  Rate 113 bpm.  - Personally Reviewed ? ?Physical Exam  ? ?VS:  BP (!) 124/99   Pulse (!) 112   Temp 98.2 ?F (36.8 ?C) (Oral)   Resp (!) 25   Ht '5\' 1"'$  (1.549 m)   Wt 62 kg   SpO2 97%   BMI 25.83 kg/m?  , BMI Body mass index is 25.83 kg/m?. ?GENERAL:  Well appearing.  Frail.  ?HEENT: Pupils equal round and reactive, fundi not visualized, oral mucosa unremarkable ?NECK:  No jugular venous distention, waveform within normal limits, carotid upstroke brisk and symmetric, no bruits, no thyromegaly ?LUNGS:  Clear to auscultation bilaterally ?HEART:  tachycardic.  Irregularly irregular.  PMI not displaced or sustained,S1 and S2 within normal limits, no S3, no  S4, no clicks, no rubs, no murmurs ?ABD:  Flat, positive bowel sounds normal in frequency in pitch, no bruits, no rebound, no guarding, no midline pulsatile mass, no hepatomegaly, no splenomegaly ?EXT:  2 plus pulses throughout, no edema, no cyanosis no clubbing ?SKIN:  No rashes no nodules ?NEURO:  Cranial nerves II through XII grossly intact, motor grossly intact throughout ?PSYCH:  Cognitively intact, oriented to person place and time ? ? ?Labs  ?  ?High Sensitivity Troponin:   ?Recent Labs  ?Lab 01/21/22 ?2316 01/22/22 ?0040 02/16/22 ?1441 02/16/22 ?1659  ?TROPONINIHS 22* 23* 49* 47*  ?   ?Chemistry ?Recent Labs  ?Lab 02/16/22 ?1441 02/16/22 ?2002 02/17/22 ?0328  ?NA 132*  --  133*  ?K 3.2*  --  4.0  ?CL 97*  --  102  ?CO2 25  --  23  ?GLUCOSE 113*  --  115*  ?BUN 16  --  14  ?CREATININE 1.34*  --  1.24*  ?CALCIUM 8.2*  --  7.7*  ?MG  --  1.6*  --   ?PROT 5.7*  --   --   ?ALBUMIN  2.9*  --   --   ?AST 48*  --   --   ?ALT 45*  --   --   ?ALKPHOS 73  --   --   ?BILITOT 0.9  --   --   ?GFRNONAA 40*  --  44*  ?ANIONGAP 10  --  8  ?  ?Lipids No results for input(s): CHOL, TRIG, HDL, LABVLDL, LDLCALC, CHOLHDL in the last 168 hours.  ?Hematology ?Recent Labs  ?Lab 02/16/22 ?1441 02/17/22 ?0328  ?WBC 1.6* 1.7*  ?RBC 3.29* 3.19*  ?HGB 9.6* 9.5*  ?HCT 29.8* 29.2*  ?MCV 90.6 91.5  ?MCH 29.2 29.8  ?MCHC 32.2 32.5  ?RDW 13.8 13.8  ?PLT 274 234  ? ?Thyroid  ?Recent Labs  ?Lab 02/16/22 ?2002  ?TSH 2.529  ?FREET4 1.39*  ?  ?BNP ?Recent Labs  ?Lab 02/16/22 ?1441  ?BNP 1,028.2*  ?  ?DDimer No results for input(s): DDIMER in the last 168 hours.  ? ?Radiology  ?  ?DG Chest 1 View ? ?Result Date: 02/16/2022 ?CLINICAL DATA:  Shortness of breath. EXAM: CHEST  1 VIEW COMPARISON:  01/21/2022 FINDINGS: The right IJ power port is stable. Stable borderline cardiac enlargement. Low lung volumes with right greater than left vascular crowding and streaky atelectasis. No definite infiltrates or effusions. No pneumothorax. IMPRESSION: Low lung  volumes with vascular crowding and atelectasis, more pronounced on right. Electronically Signed   By: Marijo Sanes M.D.   On: 02/16/2022 15:21   ? ?Cardiac Studies  ? ?RHC/LHC 01/15/22: ?  Prox RCA lesion is 15% stenosed. ?  LV end diastolic pressure is normal. ?  The left ventricular ejection fraction is 50-55% by visual estimate. ?  ?1.  Mild obstructive coronary artery disease. ?2.  Normal cardiac output and index with mean right atrial pressure of 4 mmHg and wedge pressure of 11 mmHg. ?3.  Rapid atrial fibrillation. ? ?Echo 12/31/21: ? 1. Inferior basal hypokinesis . Left ventricular ejection fraction, by  ?estimation, is 50 to 55%. The left ventricle has low normal function. The  ?left ventricle demonstrates regional wall motion abnormalities (see  ?scoring diagram/findings for  ?description). Left ventricular diastolic parameters were normal.  ? 2. No morphologic signs of pulmonary HTN Estimated PA systolic pressure  ?only mildly elevated 38 mmHg . Right ventricular systolic function is  ?normal. The right ventricular size is normal. There is normal pulmonary  ?artery systolic pressure.  ? 3. Left atrial size was mildly dilated.  ? 4. The mitral valve is abnormal. Mild mitral valve regurgitation. No  ?evidence of mitral stenosis.  ? 5. Tricuspid valve regurgitation is mild to moderate.  ? 6. The aortic valve is normal in structure. There is moderate  ?calcification of the aortic valve. Aortic valve regurgitation is mild.  ?Aortic valve sclerosis/calcification is present, without any evidence of  ?aortic stenosis.  ? 7. The inferior vena cava is normal in size with greater than 50%  ?respiratory variability, suggesting right atrial pressure of 3 mmHg.  ? ?Patient Profile  ?   ?81 y.o. female with paroxysmal atrial fibrillation, chronic systolic diastolic heart failure with recovered LVEF, CAD, diabetes, follicular lymphoma status post chemo, chronic leukopenia, hypothyroidism, and prior COVID-19 infection  admitted with food poisoning versus viral gastritis.  Cardiology consulted for atrial fibrillation with RVR. ? ?Assessment & Plan  ?  ?# PAF:  ?Patient presented with A-fib with RVR in the setting of GI illness and intravascular volume depletion.  She started on IV amiodarone.  She has not missed any doses of her Eliquis.  She was previously on amiodarone and it was d she had recurrent A-fib/2023: Amiodarone, which was reduced to 200 mg daily on 4/18.  It was discontinued after DCCV and after her LVEF recovered.  She was on 40 mg twice daily and recently reduced it to 200 mg daily.  She is now back on IV amiodarone and remains in atrial fibrillation.  Plan for DCCV today.  She will need to be seen by EP as an outpatient to consider other options including possibly ablation. ? ?This patients CHA2DS2-VASc Score and unadjusted Ischemic Stroke Rate (% per year) is equal to 4.8 % stroke rate/year from a score of 4 ? ?Above score calculated as 1 point each if present [CHF, HTN, DM, Vascular=MI/PAD/Aortic Plaque, Age if 21-74, or Female] ?Above score calculated as 2 points each if present [Age > 75, or Stroke/TIA/TE] ? ? ?Shared Decision Making/Informed Consent ?The risks (stroke, cardiac arrhythmias rarely resulting in the need for a temporary or permanent pacemaker, skin irritation or burns and complications associated with conscious sedation including aspiration, arrhythmia, respiratory failure and death), benefits (restoration of normal sinus rhythm) and alternatives of a direct current cardioversion were explained in detail to Ms. Lorn Junes and she agrees to proceed.  ? ?# Chronic systolic and diastolic heart failure:  ?Dilated cardiomyopathy thought to be due to tachycardia.  LVEF recovered with restoration of sinus rhythm.  She appears to be well compensated today.  LVEF was 50 to 55% on echo 12/2021. ? ?# Non-obstructive CAD:  ?Minimal CAD on cath.  Not an active issue. ? ? ?For questions or updates, please contact  [place] ?Please consult [email or web address] for contact info under  ? ?  ?   ?Signed, ?Skeet Latch, MD  ?02/17/2022, 10:06 AM   ? ?

## 2022-02-17 NOTE — Anesthesia Procedure Notes (Addendum)
Procedure Name: General with mask airway ?Date/Time: 02/17/2022 1:34 PM ?Performed by: Colin Benton, CRNA ?Pre-anesthesia Checklist: Patient identified, Emergency Drugs available, Suction available and Patient being monitored ?Patient Re-evaluated:Patient Re-evaluated prior to induction ?Oxygen Delivery Method: Ambu bag ?Preoxygenation: Pre-oxygenation with 100% oxygen ?Induction Type: IV induction ?Placement Confirmation: positive ETCO2 ?Dental Injury: Teeth and Oropharynx as per pre-operative assessment  ? ? ? ? ?

## 2022-02-17 NOTE — Progress Notes (Signed)
Pt received to 6E AxOx4, VS wnL and as per flow. Pt oriented to 6E processes. NSR on telemetry. Pt familiar with the [facility] system. All questions and concerns addressed. Call bell placed within reach, will continue to monitor and maintain safety.  ?

## 2022-02-17 NOTE — Assessment & Plan Note (Addendum)
Slight drop but mostly stable. ?Follow CBC closely as outpatient.Marland Kitchen ?

## 2022-02-17 NOTE — Anesthesia Postprocedure Evaluation (Signed)
Anesthesia Post Note ? ?Patient: Carrie [name] ? ?Procedure(s) Performed: CARDIOVERSION ? ?  ? ?Patient location during evaluation: PACU ?Anesthesia Type: General ?Level of consciousness: awake and alert ?Pain management: pain level controlled ?Vital Signs Assessment: post-procedure vital signs reviewed and stable ?Respiratory status: spontaneous breathing, nonlabored ventilation and respiratory function stable ?Cardiovascular status: blood pressure returned to baseline ?Postop Assessment: no apparent nausea or vomiting ?Anesthetic complications: no ? ? ?No notable events documented. ? ?Last Vitals:  ?Vitals:  ? 02/17/22 1341 02/17/22 1410  ?BP: 132/67 94/62  ?Pulse: 74 73  ?Resp:  20  ?Temp: (!) 36.2 ?C   ?SpO2: 96% 100%  ?  ?Last Pain:  ?Vitals:  ? 02/17/22 1410  ?TempSrc:   ?PainSc: 0-No pain  ? ? ?  ?  ?  ?  ?  ?  ? ?Marthenia Rolling ? ? ? ? ?

## 2022-02-17 NOTE — Evaluation (Signed)
Occupational Therapy Evaluation ?Patient Details ?Name: Carrie [name] ?MRN: [id number] ?DOB: [birth year] ?Today's Date: 02/17/2022 ? ? ?History of Present Illness Carrie [name] is a 81 y.o. female with medical history significant of paroxysmal A-fib with frequent exacerbations, improved ejection fraction, coronary artery disease, follicular lymphoma on rituximab presented to the hospital with about 2 days of being weak with prolonged history of weakness and fatigue.  Patient also suffers from chronic cough which is after her COVID-19 infection.  Patient is started having diarrhea, 3-4 loose bowel movement over the weekend which is already slowing down.    ED Course: Initial blood pressure 87/53 with heart rate of 130.  Pancytopenic.  Potassium 3.2.  On 1 to 2 L of oxygen with feeling of shortness of breath.  Chest x-ray with bilateral pulmonary congestion which is comparable to previous x-rays.  ? ?Clinical Impression ?  ?Pt overall min to min guard assist for simulated selfcare tasks and transfers secondary to weakness and decreased balance.  Feel she will benefit from continued acute care OT to address these deficits in order to return home with occasional assist from a close friend.  Recommend HHOT for safety as well.   ?   ? ?Recommendations for follow up therapy are one component of a multi-disciplinary discharge planning process, led by the attending physician.  Recommendations may be updated based on patient status, additional functional criteria and insurance authorization.  ? ?Follow Up Recommendations ? Home health OT  ?  ?Assistance Recommended at Discharge Intermittent Supervision/Assistance  ?Patient can return home with the following Assistance with cooking/housework;Help with stairs or ramp for entrance ? ?  ?Functional Status Assessment ? Patient has had a recent decline in their functional status and demonstrates the ability to make significant improvements in function in a reasonable and predictable  amount of time.  ?Equipment Recommendations ? None recommended by OT  ?  ?   ?Precautions / Restrictions Precautions ?Precautions: Fall ?Restrictions ?Weight Bearing Restrictions: No  ? ?  ? ?Mobility Bed Mobility ?Overal bed mobility: Needs Assistance ?Bed Mobility: Supine to Sit, Sit to Supine ?  ?  ?Supine to sit: Min assist ?  ?  ?  ?  ? ?Transfers ?Overall transfer level: Needs assistance ?  ?Transfers: Sit to/from Stand, Bed to chair/wheelchair/BSC ?Sit to Stand: Min guard ?  ?  ?Step pivot transfers: Min guard ?  ?  ?General transfer comment: stand step with no device ?  ? ?  ?Balance Overall balance assessment: Needs assistance ?Sitting-balance support: Feet unsupported ?Sitting balance-Leahy Scale: Fair ?Sitting balance - Comments: LOB posteriorly when attempting to donn her shoes at elevated edge of stretcher ?  ?Standing balance support: During functional activity ?Standing balance-Leahy Scale: Fair ?  ?  ?  ?  ?  ?  ?  ?  ?  ?  ?  ?  ?   ? ?ADL either performed or assessed with clinical judgement  ? ?ADL Overall ADL's : Needs assistance/impaired ?Eating/Feeding: Independent;Sitting ?  ?Grooming: Set up;Sitting ?Grooming Details (indicate cue type and reason): simulated ?Upper Body Bathing: Set up;Sitting ?Upper Body Bathing Details (indicate cue type and reason): simulated ?  ?Lower Body Bathing Details (indicate cue type and reason): min guard sit to stand ?Upper Body Dressing : Supervision/safety;Sitting ?Upper Body Dressing Details (indicate cue type and reason): simulated ?Lower Body Dressing: Minimal assistance;Sit to/from stand ?  ?Toilet Transfer: Min guard;Stand-pivot ?  ?Toileting- Clothing Manipulation and Hygiene: Minimal assistance;Sit to/from stand ?  ?  ?  ?  Functional mobility during ADLs: Min guard (for taking a few steps to the chair at bedside) ?General ADL Comments: Pt lives alone but has a friend "[name]" who can help her out as needed.  She reports having a built in seat and higher  toilets at home with grab rails as well.  HR increasing up to the mid 130s with O2 sats at 92% on room air throughout eval.  BP in sitting at 124/94  ? ? ? ?Vision Baseline Vision/History: 1 Wears glasses ?Ability to See in Adequate Light: 0 Adequate ?Patient Visual Report: No change from baseline ?Vision Assessment?: No apparent visual deficits  ?   ?Perception Perception ?Perception: Within Functional Limits ?  ?Praxis Praxis ?Praxis: Intact ?  ? ?Pertinent Vitals/Pain Pain Assessment ?Pain Assessment: No/denies pain  ? ? ? ?Hand Dominance Right ?  ?Extremity/Trunk Assessment Upper Extremity Assessment ?Upper Extremity Assessment: Generalized weakness (4/5 throughout.  Shoulder AROM 0-90 degrees bilaterally with pt having history of left shoulder replacement.) ?  ?Lower Extremity Assessment ?Lower Extremity Assessment: Defer to PT evaluation ?  ?Cervical / Trunk Assessment ?Cervical / Trunk Assessment: Normal ?  ?Communication Communication ?Communication: No difficulties ?  ?Cognition Arousal/Alertness: Awake/alert ?Behavior During Therapy: [facility] for tasks assessed/performed ?Overall Cognitive Status: Within Functional Limits for tasks assessed ?  ?  ?  ?  ?  ?  ?  ?  ?  ?  ?  ?  ?  ?  ?  ?  ?  ?  ?  ?   ?   ?   ? ? ?Home Living Family/patient expects to be discharged to:: Private residence ?Living Arrangements: Alone ?Available Help at Discharge: Friend(s) [name] can come by) ?Type of Home: House ?Home Access: Ramped entrance ?  ?  ?Home Layout: One level ?  ?  ?Bathroom Shower/Tub: Walk-in shower ?  ?Bathroom Toilet: Handicapped height ?  ?  ?Home Equipment: Grab bars - toilet;Electric scooter;Wheelchair - manual;BSC/3in1;Shower seat - built in ?  ?Additional Comments: Pt ambulated without assistive device ?  ? ?  ?Prior Functioning/Environment Prior Level of Function : Independent/Modified Independent ?  ?  ?  ?  ?  ?  ?  ?  ?  ? ?  ?  ?OT Problem List: Decreased strength;Impaired balance (sitting and/or  standing);Decreased knowledge of use of DME or AE ?  ?   ?OT Treatment/Interventions: Self-care/ADL training;DME and/or AE instruction;Therapeutic activities;Balance training;Patient/family education  ?  ?OT Goals(Current goals can be found in the care plan section) Acute Rehab OT Goals ?Patient Stated Goal: Pt did not state but agreeable to OT eval. ?OT Goal Formulation: With patient ?Time For Goal Achievement: 03/03/22 ?Potential to Achieve Goals: Good  ?OT Frequency: Min 2X/week ?  ? ?   ?AM-PAC OT "6 Clicks" Daily Activity     ?Outcome Measure Help from another person eating meals?: None ?Help from another person taking care of personal grooming?: None ?Help from another person toileting, which includes using toliet, bedpan, or urinal?: A Little ?Help from another person bathing (including washing, rinsing, drying)?: A Little ?Help from another person to put on and taking off regular upper body clothing?: None ?Help from another person to put on and taking off regular lower body clothing?: A Little ?6 Click Score: 21 ?  ?End of Session   ? ?Activity Tolerance: Patient tolerated treatment well ?Patient left: in chair;Other (comment) (heading out for procedure) ? ?OT Visit Diagnosis: Unsteadiness on feet (R26.81);Muscle weakness (generalized) (M62.81)  ?              ?  Time: 5035-4656 ?OT Time Calculation (min): 32 min ?Charges:  OT General Charges ?$OT Visit: 1 Visit ?OT Evaluation ?$OT Eval Moderate Complexity: 1 Mod ?OT Treatments ?$Self Care/Home Management : 8-22 mins ? ?Kayton Dunaj OTR/L ?02/17/2022, 1:36 PM ?

## 2022-02-17 NOTE — Progress Notes (Signed)
? ?Progress Note ? ?Patient Name: Carrie [name] ?Date of Encounter: 02/17/2022 ? ?[facility] Cardiologist: [name], MD  ? ?Subjective  ? ?Feeling OK.  Breathing improving.  Currently denies palpitations or chest pain.  ? ?Inpatient Medications  ?  ?Scheduled Meds: ? apixaban  5 mg Oral BID  ? levothyroxine  88 mcg Oral Q0600  ? potassium chloride  40 mEq Oral BID  ? ?Continuous Infusions: ? amiodarone 30 mg/hr (02/16/22 2249)  ? ?PRN Meds: ?acetaminophen **OR** acetaminophen, ondansetron **OR** ondansetron (ZOFRAN) IV, polyvinyl alcohol  ? ?Vital Signs  ?  ?Vitals:  ? 02/17/22 0800 02/17/22 0845 02/17/22 0916 02/17/22 0930  ?BP: 119/84 (!) 118/92  133/90  ?Pulse: 87 77  (!) 109  ?Resp: 20 (!) 28  (!) 22  ?Temp:      ?TempSrc:      ?SpO2: 96% 95%  99%  ?Weight:   62 kg   ?Height:   '5\' 1"'$  (1.549 m)   ? ? ?Intake/Output Summary (Last 24 hours) at 02/17/2022 1006 ?Last data filed at 02/17/2022 0018 ?Gross per 24 hour  ?Intake 1000 ml  ?Output --  ?Net 1000 ml  ? ? ?  02/17/2022  ?  9:16 AM 02/02/2022  ?  8:53 AM 01/24/2022  ?  5:01 AM  ?Last 3 Weights  ?Weight (lbs) 136 lb 11 oz 136 lb 9.6 oz 143 lb 1.3 oz  ?Weight (kg) 62 kg 61.961 kg 64.9 kg  ?   ? ?Telemetry  ?  ?Atrial fibirllation.  Rate 90s-110s - Personally Reviewed ? ?ECG  ?  ?Atrial fibrillation.  Rate 113 bpm.  - Personally Reviewed ? ?Physical Exam  ? ?VS:  BP (!) 124/99   Pulse (!) 112   Temp 98.2 ?F (36.8 ?C) (Oral)   Resp (!) 25   Ht '5\' 1"'$  (1.549 m)   Wt 62 kg   SpO2 97%   BMI 25.83 kg/m?  , BMI Body mass index is 25.83 kg/m?. ?GENERAL:  Well appearing.  Frail.  ?HEENT: Pupils equal round and reactive, fundi not visualized, oral mucosa unremarkable ?NECK:  No jugular venous distention, waveform within normal limits, carotid upstroke brisk and symmetric, no bruits, no thyromegaly ?LUNGS:  Clear to auscultation bilaterally ?HEART:  tachycardic.  Irregularly irregular.  PMI not displaced or sustained,S1 and S2 within normal limits, no S3, no  S4, no clicks, no rubs, no murmurs ?ABD:  Flat, positive bowel sounds normal in frequency in pitch, no bruits, no rebound, no guarding, no midline pulsatile mass, no hepatomegaly, no splenomegaly ?EXT:  2 plus pulses throughout, no edema, no cyanosis no clubbing ?SKIN:  No rashes no nodules ?NEURO:  Cranial nerves II through XII grossly intact, motor grossly intact throughout ?PSYCH:  Cognitively intact, oriented to person place and time ? ? ?Labs  ?  ?High Sensitivity Troponin:   ?Recent Labs  ?Lab 01/21/22 ?2316 01/22/22 ?0040 02/16/22 ?1441 02/16/22 ?1659  ?TROPONINIHS 22* 23* 49* 47*  ?   ?Chemistry ?Recent Labs  ?Lab 02/16/22 ?1441 02/16/22 ?2002 02/17/22 ?0328  ?NA 132*  --  133*  ?K 3.2*  --  4.0  ?CL 97*  --  102  ?CO2 25  --  23  ?GLUCOSE 113*  --  115*  ?BUN 16  --  14  ?CREATININE 1.34*  --  1.24*  ?CALCIUM 8.2*  --  7.7*  ?MG  --  1.6*  --   ?PROT 5.7*  --   --   ?ALBUMIN  2.9*  --   --   ?AST 48*  --   --   ?ALT 45*  --   --   ?ALKPHOS 73  --   --   ?BILITOT 0.9  --   --   ?GFRNONAA 40*  --  44*  ?ANIONGAP 10  --  8  ?  ?Lipids No results for input(s): CHOL, TRIG, HDL, LABVLDL, LDLCALC, CHOLHDL in the last 168 hours.  ?Hematology ?Recent Labs  ?Lab 02/16/22 ?1441 02/17/22 ?0328  ?WBC 1.6* 1.7*  ?RBC 3.29* 3.19*  ?HGB 9.6* 9.5*  ?HCT 29.8* 29.2*  ?MCV 90.6 91.5  ?MCH 29.2 29.8  ?MCHC 32.2 32.5  ?RDW 13.8 13.8  ?PLT 274 234  ? ?Thyroid  ?Recent Labs  ?Lab 02/16/22 ?2002  ?TSH 2.529  ?FREET4 1.39*  ?  ?BNP ?Recent Labs  ?Lab 02/16/22 ?1441  ?BNP 1,028.2*  ?  ?DDimer No results for input(s): DDIMER in the last 168 hours.  ? ?Radiology  ?  ?DG Chest 1 View ? ?Result Date: 02/16/2022 ?CLINICAL DATA:  Shortness of breath. EXAM: CHEST  1 VIEW COMPARISON:  01/21/2022 FINDINGS: The right IJ power port is stable. Stable borderline cardiac enlargement. Low lung volumes with right greater than left vascular crowding and streaky atelectasis. No definite infiltrates or effusions. No pneumothorax. IMPRESSION: Low lung  volumes with vascular crowding and atelectasis, more pronounced on right. Electronically Signed   By: Marijo Sanes M.D.   On: 02/16/2022 15:21   ? ?Cardiac Studies  ? ?RHC/LHC 01/15/22: ?  Prox RCA lesion is 15% stenosed. ?  LV end diastolic pressure is normal. ?  The left ventricular ejection fraction is 50-55% by visual estimate. ?  ?1.  Mild obstructive coronary artery disease. ?2.  Normal cardiac output and index with mean right atrial pressure of 4 mmHg and wedge pressure of 11 mmHg. ?3.  Rapid atrial fibrillation. ? ?Echo 12/31/21: ? 1. Inferior basal hypokinesis . Left ventricular ejection fraction, by  ?estimation, is 50 to 55%. The left ventricle has low normal function. The  ?left ventricle demonstrates regional wall motion abnormalities (see  ?scoring diagram/findings for  ?description). Left ventricular diastolic parameters were normal.  ? 2. No morphologic signs of pulmonary HTN Estimated PA systolic pressure  ?only mildly elevated 38 mmHg . Right ventricular systolic function is  ?normal. The right ventricular size is normal. There is normal pulmonary  ?artery systolic pressure.  ? 3. Left atrial size was mildly dilated.  ? 4. The mitral valve is abnormal. Mild mitral valve regurgitation. No  ?evidence of mitral stenosis.  ? 5. Tricuspid valve regurgitation is mild to moderate.  ? 6. The aortic valve is normal in structure. There is moderate  ?calcification of the aortic valve. Aortic valve regurgitation is mild.  ?Aortic valve sclerosis/calcification is present, without any evidence of  ?aortic stenosis.  ? 7. The inferior vena cava is normal in size with greater than 50%  ?respiratory variability, suggesting right atrial pressure of 3 mmHg.  ? ?Patient Profile  ?   ?81 y.o. female with paroxysmal atrial fibrillation, chronic systolic diastolic heart failure with recovered LVEF, CAD, diabetes, follicular lymphoma status post chemo, chronic leukopenia, hypothyroidism, and prior COVID-19 infection  admitted with food poisoning versus viral gastritis.  Cardiology consulted for atrial fibrillation with RVR. ? ?Assessment & Plan  ?  ?# PAF:  ?Patient presented with A-fib with RVR in the setting of GI illness and intravascular volume depletion.  She started on IV amiodarone.  She has not missed any doses of her Eliquis.  She was previously on amiodarone and it was d she had recurrent A-fib/2023: Amiodarone, which was reduced to 200 mg daily on 4/18.  It was discontinued after DCCV and after her LVEF recovered.  She was on 40 mg twice daily and recently reduced it to 200 mg daily.  She is now back on IV amiodarone and remains in atrial fibrillation.  Plan for DCCV today.  She will need to be seen by EP as an outpatient to consider other options including possibly ablation. ? ?This patients CHA2DS2-VASc Score and unadjusted Ischemic Stroke Rate (% per year) is equal to 4.8 % stroke rate/year from a score of 4 ? ?Above score calculated as 1 point each if present [CHF, HTN, DM, Vascular=MI/PAD/Aortic Plaque, Age if 28-74, or Female] ?Above score calculated as 2 points each if present [Age > 75, or Stroke/TIA/TE] ? ? ?Shared Decision Making/Informed Consent ?The risks (stroke, cardiac arrhythmias rarely resulting in the need for a temporary or permanent pacemaker, skin irritation or burns and complications associated with conscious sedation including aspiration, arrhythmia, respiratory failure and death), benefits (restoration of normal sinus rhythm) and alternatives of a direct current cardioversion were explained in detail to Carrie [name] and she agrees to proceed.  ? ?# Chronic systolic and diastolic heart failure:  ?Dilated cardiomyopathy thought to be due to tachycardia.  LVEF recovered with restoration of sinus rhythm.  She appears to be well compensated today.  LVEF was 50 to 55% on echo 12/2021. ? ?# Non-obstructive CAD:  ?Minimal CAD on cath.  Not an active issue. ? ? ?For questions or updates, please contact  [place] ?Please consult [email or web address] for contact info under  ? ?  ?   ?Signed, ?Skeet Latch, MD  ?02/17/2022, 10:06 AM   ? ?

## 2022-02-17 NOTE — Assessment & Plan Note (Signed)
Suspect due to food poisoning or acute viral GE. ?Resolved.  Monitor. ?

## 2022-02-17 NOTE — ED Notes (Signed)
Patient transported to endo.  

## 2022-02-17 NOTE — Assessment & Plan Note (Addendum)
Resolved after repletion.  Magnesium replaced and normal. ?

## 2022-02-18 ENCOUNTER — Observation Stay (HOSPITAL_COMMUNITY): Payer: No Typology Code available for payment source

## 2022-02-18 DIAGNOSIS — I4891 Unspecified atrial fibrillation: Secondary | ICD-10-CM | POA: Diagnosis not present

## 2022-02-18 DIAGNOSIS — I5033 Acute on chronic diastolic (congestive) heart failure: Secondary | ICD-10-CM | POA: Diagnosis not present

## 2022-02-18 LAB — CBC WITH DIFFERENTIAL/PLATELET
Abs Immature Granulocytes: 0.13 10*3/uL — ABNORMAL HIGH (ref 0.00–0.07)
Basophils Absolute: 0 10*3/uL (ref 0.0–0.1)
Basophils Relative: 1 %
Eosinophils Absolute: 0 10*3/uL (ref 0.0–0.5)
Eosinophils Relative: 3 %
HCT: 26.8 % — ABNORMAL LOW (ref 36.0–46.0)
Hemoglobin: 8.9 g/dL — ABNORMAL LOW (ref 12.0–15.0)
Immature Granulocytes: 9 %
Lymphocytes Relative: 9 %
Lymphs Abs: 0.1 10*3/uL — ABNORMAL LOW (ref 0.7–4.0)
MCH: 30 pg (ref 26.0–34.0)
MCHC: 33.2 g/dL (ref 30.0–36.0)
MCV: 90.2 fL (ref 80.0–100.0)
Monocytes Absolute: 0.3 10*3/uL (ref 0.1–1.0)
Monocytes Relative: 22 %
Neutro Abs: 0.8 10*3/uL — ABNORMAL LOW (ref 1.7–7.7)
Neutrophils Relative %: 56 %
Platelets: 245 10*3/uL (ref 150–400)
RBC: 2.97 MIL/uL — ABNORMAL LOW (ref 3.87–5.11)
RDW: 14.1 % (ref 11.5–15.5)
WBC: 1.4 10*3/uL — CL (ref 4.0–10.5)
nRBC: 0 % (ref 0.0–0.2)

## 2022-02-18 LAB — URINE CULTURE: Culture: >=100000 — AB

## 2022-02-18 LAB — PROCALCITONIN: Procalcitonin: <0.1 ng/mL

## 2022-02-18 LAB — BASIC METABOLIC PANEL
Anion gap: 10 (ref 5–15)
BUN: 13 mg/dL (ref 8–23)
CO2: 21 mmol/L — ABNORMAL LOW (ref 22–32)
Calcium: 7.8 mg/dL — ABNORMAL LOW (ref 8.9–10.3)
Chloride: 99 mmol/L (ref 98–111)
Creatinine, Ser: 1.19 mg/dL — ABNORMAL HIGH (ref 0.44–1.00)
GFR, Estimated: 47 mL/min — ABNORMAL LOW (ref >60)
Glucose, Bld: 164 mg/dL — ABNORMAL HIGH (ref 70–99)
Potassium: 4.2 mmol/L (ref 3.5–5.1)
Sodium: 130 mmol/L — ABNORMAL LOW (ref 135–145)

## 2022-02-18 LAB — C-REACTIVE PROTEIN: CRP: 13.1 mg/dL — ABNORMAL HIGH (ref <1.0)

## 2022-02-18 LAB — MAGNESIUM: Magnesium: 2.1 mg/dL (ref 1.7–2.4)

## 2022-02-18 LAB — BRAIN NATRIURETIC PEPTIDE: B Natriuretic Peptide: 863.9 pg/mL — ABNORMAL HIGH (ref 0.0–100.0)

## 2022-02-18 MED ORDER — FUROSEMIDE 10 MG/ML IJ SOLN
20.0000 mg | Freq: Once | INTRAMUSCULAR | Status: AC
  Administered 2022-02-18: 20 mg via INTRAVENOUS
  Filled 2022-02-18: qty 2

## 2022-02-18 MED ORDER — AMIODARONE HCL 200 MG PO TABS: 200.0000 mg | ORAL_TABLET | Freq: Every day | ORAL | Status: DC

## 2022-02-18 MED ORDER — FUROSEMIDE 20 MG PO TABS: 20.0000 mg | ORAL_TABLET | Freq: Every day | ORAL | Status: DC

## 2022-02-18 MED ORDER — HEPARIN SOD (PORK) LOCK FLUSH 100 UNIT/ML IV SOLN
500.0000 [IU] | INTRAVENOUS | Status: AC | PRN
  Administered 2022-02-18: 500 [IU]
  Filled 2022-02-18: qty 5

## 2022-02-18 MED ORDER — SODIUM CHLORIDE 0.9% FLUSH
10.0000 mL | Freq: Two times a day (BID) | INTRAVENOUS | Status: DC
  Administered 2022-02-18: 10 mL

## 2022-02-18 MED ORDER — SODIUM CHLORIDE 0.9% FLUSH: 10.0000 mL | INTRAVENOUS | Status: DC | PRN

## 2022-02-18 MED ORDER — AMIODARONE HCL 200 MG PO TABS: ORAL_TABLET | ORAL | 1 refills | Status: AC

## 2022-02-18 MED ORDER — CHLORHEXIDINE GLUCONATE CLOTH 2 % EX PADS
6.0000 | MEDICATED_PAD | Freq: Every day | CUTANEOUS | Status: DC
  Administered 2022-02-18: 6 via TOPICAL

## 2022-02-18 MED ORDER — FUROSEMIDE 20 MG PO TABS
20.0000 mg | ORAL_TABLET | Freq: Every day | ORAL | 0 refills | Status: DC
Start: 2022-02-19 — End: 2022-03-03

## 2022-02-18 MED ORDER — AMIODARONE HCL 200 MG PO TABS
200.0000 mg | ORAL_TABLET | Freq: Two times a day (BID) | ORAL | Status: DC
  Administered 2022-02-18: 200 mg via ORAL
  Filled 2022-02-18: qty 1

## 2022-02-18 NOTE — Progress Notes (Signed)
? ?Progress Note ? ?Patient Name: Carrie [name] ?Date of Encounter: 02/18/2022 ? ?[facility] Cardiologist: [name], MD  ? ?Subjective  ? ?Still feels tired, has not been OOB ?Post-COVID cough, has been coughing up some white phlegm ? ?Inpatient Medications  ?  ?Scheduled Meds: ? apixaban  5 mg Oral BID  ? Chlorhexidine Gluconate Cloth  6 each Topical Daily  ? furosemide  20 mg Intravenous Once  ? levothyroxine  88 mcg Oral Q0600  ? sodium chloride flush  10-40 mL Intracatheter Q12H  ? ?Continuous Infusions: ? amiodarone 30 mg/hr (02/18/22 0538)  ? ?PRN Meds: ?acetaminophen **OR** acetaminophen, ondansetron **OR** ondansetron (ZOFRAN) IV, polyvinyl alcohol, sodium chloride flush  ? ?Vital Signs  ?  ?Vitals:  ? 02/18/22 0100 02/18/22 0535 02/18/22 5456 02/18/22 0811  ?BP: (!) 158/82   (!) 142/84  ?Pulse:  79 (!) 109 73  ?Resp:  (!) 21  20  ?Temp:  98.6 ?F (37 ?C)  97.6 ?F (36.4 ?C)  ?TempSrc:  Oral  Oral  ?SpO2: 100% 97% 93% 98%  ?Weight:      ?Height:      ? ? ?Intake/Output Summary (Last 24 hours) at 02/18/2022 0830 ?Last data filed at 02/18/2022 0500 ?Gross per 24 hour  ?Intake 927.63 ml  ?Output 50 ml  ?Net 877.63 ml  ? ? ?  02/17/2022  ?  3:48 PM 02/17/2022  ?  9:16 AM 02/02/2022  ?  8:53 AM  ?Last 3 Weights  ?Weight (lbs) 140 lb 8 oz 136 lb 11 oz 136 lb 9.6 oz  ?Weight (kg) 63.73 kg 62 kg 61.961 kg  ?   ? ?Telemetry  ?  ?SR - Personally Reviewed ? ?ECG  ?  ?Post-DCCV ECG is SR, HR 73 diffuse T wave flattening  - Personally Reviewed ? ?Physical Exam  ? ?VS:  BP (!) 142/84 (BP Location: Right Arm)   Pulse 73   Temp 97.6 ?F (36.4 ?C) (Oral)   Resp 20   Ht '5\' 1"'$  (1.549 m)   Wt 63.7 kg   SpO2 98%   BMI 26.55 kg/m?  , BMI Body mass index is 26.55 kg/m?. ? ?General: Well developed, well nourished, female in no acute distress ?Head: Eyes PERRLA, Head normocephalic and atraumatic ?Lungs: rales bases bilaterally to auscultation. ?Heart: HRRR S1 S2, without rub or gallop. 2/6 murmur. 4/4 extremity pulses are  2+ & equal. JVD 9 ?Abdomen: Bowel sounds are present, abdomen soft and non-tender without masses or  hernias noted. ?Msk: Normal strength and tone for age. ?Extremities: No clubbing, cyanosis or edema.    ?Skin:  No rashes or lesions noted. ?Neuro: Alert and oriented X 3. ?Psych:  Good affect, responds appropriately ? ?Labs  ?  ?High Sensitivity Troponin:   ?Recent Labs  ?Lab 01/21/22 ?2316 01/22/22 ?0040 02/16/22 ?1441 02/16/22 ?1659  ?TROPONINIHS 22* 23* 49* 47*  ?   ?Chemistry ?Recent Labs  ?Lab 02/16/22 ?1441 02/16/22 ?2002 02/17/22 ?0328 02/17/22 ?1910 02/18/22 ?0238  ?NA 132*  --  133*  --  130*  ?K 3.2*  --  4.0  --  4.2  ?CL 97*  --  102  --  99  ?CO2 25  --  23  --  21*  ?GLUCOSE 113*  --  115*  --  164*  ?BUN 16  --  14  --  13  ?CREATININE 1.34*  --  1.24*  --  1.19*  ?CALCIUM 8.2*  --  7.7*  --  7.8*  ?MG  --  1.6*  --  1.7 2.1  ?PROT 5.7*  --   --   --   --   ?ALBUMIN 2.9*  --   --   --   --   ?AST 48*  --   --   --   --   ?ALT 45*  --   --   --   --   ?ALKPHOS 73  --   --   --   --   ?BILITOT 0.9  --   --   --   --   ?GFRNONAA 40*  --  44*  --  47*  ?ANIONGAP 10  --  8  --  10  ?  ?Lipids No results for input(s): CHOL, TRIG, HDL, LABVLDL, LDLCALC, CHOLHDL in the last 168 hours.  ?Hematology ?Recent Labs  ?Lab 02/16/22 ?1441 02/17/22 ?6599 02/18/22 ?0238  ?WBC 1.6* 1.7* 1.4*  ?RBC 3.29* 3.19* 2.97*  ?HGB 9.6* 9.5* 8.9*  ?HCT 29.8* 29.2* 26.8*  ?MCV 90.6 91.5 90.2  ?MCH 29.2 29.8 30.0  ?MCHC 32.2 32.5 33.2  ?RDW 13.8 13.8 14.1  ?PLT 274 234 245  ? ?Thyroid  ?Recent Labs  ?Lab 02/16/22 ?2002  ?TSH 2.529  ?FREET4 1.39*  ?  ?BNP ?Recent Labs  ?Lab 02/16/22 ?1441 02/18/22 ?0238  ?BNP 1,028.2* 863.9*  ?  ?DDimer No results for input(s): DDIMER in the last 168 hours.  ? ?Radiology  ?  ?DG Chest 1 View ? ?Result Date: 02/16/2022 ?CLINICAL DATA:  Shortness of breath. EXAM: CHEST  1 VIEW COMPARISON:  01/21/2022 FINDINGS: The right IJ power port is stable. Stable borderline cardiac enlargement. Low lung volumes with  right greater than left vascular crowding and streaky atelectasis. No definite infiltrates or effusions. No pneumothorax. IMPRESSION: Low lung volumes with vascular crowding and atelectasis, more pronounced on right. Electronically Signed   By: Marijo Sanes M.D.   On: 02/16/2022 15:21  ? ?DG CHEST PORT 1 VIEW ? ?Result Date: 02/18/2022 ?CLINICAL DATA:  Shortness of breath EXAM: PORTABLE CHEST 1 VIEW COMPARISON:  02/16/2022 FINDINGS: Cardiac shadow is enlarged but stable. Right chest wall port is again noted and stable. Lungs are well aerated bilaterally. Patchy airspace opacity is noted in the right upper lobe new from the prior exam likely representing early infiltrate. No bony abnormality is seen. IMPRESSION: Patchy airspace opacity in the right upper lobe. Electronically Signed   By: Inez Catalina M.D.   On: 02/18/2022 01:43   ? ?Cardiac Studies  ? ?RHC/LHC 01/15/22: ?  Prox RCA lesion is 15% stenosed. ?  LV end diastolic pressure is normal. ?  The left ventricular ejection fraction is 50-55% by visual estimate. ?  ?1.  Mild obstructive coronary artery disease. ?2.  Normal cardiac output and index with mean right atrial pressure of 4 mmHg and wedge pressure of 11 mmHg. ?3.  Rapid atrial fibrillation. ? ?Echo 12/31/21: ? 1. Inferior basal hypokinesis . Left ventricular ejection fraction, by  ?estimation, is 50 to 55%. The left ventricle has low normal function. The  ?left ventricle demonstrates regional wall motion abnormalities (see  ?scoring diagram/findings for  ?description). Left ventricular diastolic parameters were normal.  ? 2. No morphologic signs of pulmonary HTN Estimated PA systolic pressure  ?only mildly elevated 38 mmHg . Right ventricular systolic function is  ?normal. The right ventricular size is normal. There is normal pulmonary  ?artery systolic pressure.  ? 3. Left atrial size was mildly dilated.  ?  4. The mitral valve is abnormal. Mild mitral valve regurgitation. No  ?evidence of mitral stenosis.   ? 5. Tricuspid valve regurgitation is mild to moderate.  ? 6. The aortic valve is normal in structure. There is moderate  ?calcification of the aortic valve. Aortic valve regurgitation is mild.  ?Aortic valve sclerosis/calcification is present, without any evidence of  ?aortic stenosis.  ? 7. The inferior vena cava is normal in size with greater than 50%  ?respiratory variability, suggesting right atrial pressure of 3 mmHg.  ? ?Patient Profile  ?   ?81 y.o. female with paroxysmal atrial fibrillation, chronic systolic diastolic heart failure with recovered LVEF, CAD, diabetes, follicular lymphoma status post chemo, chronic leukopenia, hypothyroidism, and prior COVID-19 infection admitted with food poisoning versus viral gastritis.  Cardiology consulted for atrial fibrillation with RVR. ? ?Assessment & Plan  ?  ?# PAF:  ?- had DCCV 05/03 >> SR  ?- Maintaining SR overnight ?- amio decreased to 200 mg qd on 04/18, put back on IV amio here, still on 30 mg/hr ?- discuss change to oral dosing w/ MD ?- EP to see as outpt, has appt in Afib clinic 05/11 ? ?This patients CHA2DS2-VASc Score and unadjusted Ischemic Stroke Rate (% per year) is equal to 4.8 % stroke rate/year from a score of 4 ? ?Above score calculated as 1 point each if present [CHF, HTN, DM, Vascular=MI/PAD/Aortic Plaque, Age if 35-74, or Female] ?Above score calculated as 2 points each if present [Age > 75, or Stroke/TIA/TE] ? ?# Chronic systolic and diastolic heart failure:  ?- 12/31/2021 echo w/ EF 50-55%, +WMA, PAS nl, diastolic parameters normal ?- neck veins up a little ?- Lasix 20 mg IV x 1 ordered by IM ?- CXR w/ patchy airspace opacity RUL, likely infiltrate ?- has not yet been started on ABX, per IM ? ?# Non-obstructive CAD:  ?Minimal CAD on cath.  Not an active issue. ? ? ?For questions or updates, please contact [place] ?Please consult [email or web address] for contact info under  ? ?  ?   ?Signed, ?Rosaria Ferries, PA-C  ?02/18/2022, 8:30 AM   ? ?

## 2022-02-18 NOTE — Progress Notes (Addendum)
HOSPITAL MEDICINE OVERNIGHT EVENT NOTE   ? ?Notified by nursing that patient has been exhibiting gradually increasing shortness of breath throughout the evening shift.  This has been associated with cough, productive with what she describes as clear frothy sputum.  Nursing reports no associated chest pain. ? ?Chest x-ray was obtained by cardiology order revealing questionable developing patchy opacities in the right upper lobe. ? ?Clinically, patient's symptoms seem more consistent with some degree of pulmonary edema although a developing pneumonia is also possible.  Obtaining repeat BNP, procalcitonin and CRP.  Based on these results will treat accordingly. ? ?[name]  MD ?[facility]  ? ?ADDENDUM (5/4 7am) ? ?BNP is downtrending but still elevated compared to their historical baseline.  Procalcitonin is normal suggesting against an infectious process.  It is possible that patient has some degree of mild pulmonary edema due to recently being in atrial fibrillation.  We will try a trial dose of 20 mg of intravenous Lasix.  Patient can be reassessed by day team. ? ?Carrie [name] ? ? ? ? ? ? ? ? ? ? ? ? ?

## 2022-02-18 NOTE — Care Management (Signed)
02-18-22 1340 Case Manager spoke with the patient regarding disposition needs. Patient declined Home Health services. Patient states she will go through her PCP for needs in the future. Patient has transportation home. Case Manager did make the MD and Staff RN aware. No further needs identified at this time.  ?

## 2022-02-18 NOTE — Progress Notes (Signed)
Patient does have a DPOA for heathcare and living will about 10 years ago. Copy in chart. She does not have a legal guardian. ?

## 2022-02-18 NOTE — Progress Notes (Signed)
?  Mobility Specialist Criteria Algorithm Info. ? ? ? 02/18/22 1330  ?Mobility  ?Activity Ambulated independently to bathroom;Dangled on edge of bed  ?Range of Motion/Exercises Active;All extremities  ?Level of Assistance Standby assist, set-up cues, supervision of patient - no hands on  ?Distance Ambulated (ft) 25 ft  ?Activity Response Tolerated well  ? ?Patient received dangling EOB eager to discharge home. Declined hallway ambulation. Ambulated to bathroom with supervision. Was left with all needs met, call bell in reach. ? ?02/18/2022 ?4:19 PM ? ?[place] Ettie Krontz, CMS, BS EXP ?Acute Rehabilitation Services  ?LTRVU:[phone] ?Office: [phone] ? ?

## 2022-02-18 NOTE — Discharge Summary (Signed)
Physician Discharge Summary  ?Carrie [name] UDJ:[id number] DOB: Aug 19, 1941 ? ?PCP: [name] ? ?Admitted from: Home ?Discharged to: Home ? ?Admit date: 02/16/2022 ?Discharge date: 02/18/2022 ? ?Recommendations for Outpatient Follow-up:  ? ? Follow-up Information   ? ? Sherran Needs, NP Follow up on 02/25/2022.   ?Specialties: Nurse Practitioner, Cardiology ?Why: atrial fib clininc parking code 1002 ?at 1:30 pm  with Ricky ?Contact information: ?[address] ?[place] [address] ?[phone] ? ? ?  ?  ? ? [facility] Schedule an appointment as soon as possible for a visit in 3 day(s).   ?Why: To be seen with repeat labs including CBC with differential, CMP, peripheral smear and anemia panel.  Recommend outpatient hematology consultation ASAP for evaluation of leukopenia. ?Contact information: ?[address] ?[place] [address] ?[phone] ? ? ?  ?  ? ? [name], MD .   ?Specialties: Cardiology, Radiology ?Contact information: ?[address] ?[address] ?[place] [address] ?[phone] ? ? ?  ?  ? ?  ?  ? ?  ? ? ?Home Health: None ?  ? ?Equipment/Devices: None ?  ? ?Discharge Condition: Improved and stable ?  Code Status: DNR ?Diet recommendation:  ?Discharge Diet Orders (From admission, onward)  ? ?  Start     Ordered  ? 02/18/22 0000  Diet - low sodium heart healthy       ? 02/18/22 1304  ? ?  ?  ? ?  ?  ? ?Discharge Diagnoses:  ?Principal Problem: ?  Atrial fibrillation with RVR ([place]) ?Active Problems: ?  Diarrhea ?  Asymptomatic bacteriuria ?  Acquired hypercoagulable state ([place]) ?  Essential hypertension ?  (HFimpEF) heart failure with improved ejection fraction ([place]) ?  Grade 3a follicular lymphoma of lymph nodes of multiple regions [facility]) ?  Stage 3a chronic kidney disease ([place]) ?  Hypothyroidism ?  Leukopenia ?  Chronic anemia ?  Hypokalemia ? ? ?Brief Hospital Course: ?81 year old female, lives alone, independent, medical history  significant for paroxysmal A-fib, CAD, DCM felt to be due to A-fib, EF improved, follicular lymphoma post chemo, HTN, hypothyroid, chronic cough after COVID-19 infection, presented to the ED on 02/16/2022 with complaints of diarrhea over the weekend prior after?  Eating some outside food, and generalized weakness.  Initial BP 87/53 and heart rates in the 130s.  Admitted for A-fib with RVR and fatigue and weakness related to same and GI losses.  Cardiology consulted.  Initiated on amiodarone drip.  S/p DCCV 5/3. ? ?Assessment and Plan: ?* Atrial fibrillation with RVR ([place]) ?On amiodarone and Eliquis at home.  Has not missed doses. ?Presented with RVR in the 130s ?Rapid A-fib likely precipitated by acute GE, dehydration.  She does not have a clinical UTI. ?TSH 2.529.  Free T4 mildly elevated at 1.39.  Recommend repeating full TFTs in 4 to 6 weeks. ?Cardiology consultation appreciated, on amiodarone drip to try and restore sinus rhythm with chemical cardioversion and if not then considering DCCV. ?CHA2DS2-VASc score: 6. ?Underwent DCCV on 5/3 and subsequently maintained sinus rhythm. ?Cardiology followed up today, transitioned to amiodarone as below, continue Eliquis.  They recommended patient stay for IV Lasix given crackles on exam and elevated BNP but patient insists on going home so she will be given a dose of IV Lasix followed by p.o. Lasix x3 days at discharge.  She has a follow-up appointment at the [facility] clinic on 5/11.  Cardiology has cleared her for discharge home. ? ?  Diarrhea ?Suspect due to food poisoning or acute viral GE. ?Resolved.  Monitor. ? ?Asymptomatic bacteriuria ?Confirmed with patient that she absolutely does not have any UTI symptoms. ?She received a dose of ceftriaxone which was not continued thereafter. ? ?Acquired hypercoagulable state ([place]) ?Due to A-fib ?Continue Eliquis. ? ?Essential hypertension ?Controlled. ? ?(HFimpEF) heart failure with improved ejection fraction ([place]) ?Lasix discussion  as noted above. ?Overnight 5/3 had some dyspnea associated with cough and clear frothy sputum.  Has had some cough since her previous COVID. ?Chest x-ray showed questionable developing patchy opacities in the right upper lobe.  Agree with nocturnist TRH MD that picture is more consistent with pulmonary edema than pneumonia in the absence of other symptoms, fever and negative procalcitonin. ?Diuretic management as above ?Could consider repeating chest x-ray in 4 weeks as outpatient to ensure resolution of above findings. ? ?Stage 3a chronic kidney disease ([place]) ?Creatinine has fluctuated some but appears to be at baseline. ?Creatinine down to 1.19 on day of discharge. ? ?Hypothyroidism ?TFT results as above. ?Recommend repeating TFTs in 4 to 6 weeks. ?Outpatient follow-up. ?Continue Synthroid dose. ? ?Leukopenia ?Appears to be chronic but somewhat worse compared to numbers from early April 2023. ?WBC count down to 1.4.  Recommended hematology consultation while in-house but patient insists on being discharged and agrees to close outpatient follow-up. ?Discussed with hematology MD on-call who indicates that she would need a referral from her Colon MD for them to see her in the office and arrange an early follow-up. ?Advised patient to follow-up with her PCP at the [facility] closely for early hematology consultation. ?As per my discussion with the hematologist here, upon reviewing of records, this appears to be a chronic intermittent issue for her.  In the absence of infection, she could be followed up outpatient. ?No clinical concern for infection at this point.  Patient advised to seek immediate medical attention if she develops any symptoms or signs of infection including fever. ?Her worsening leukopenia may be related to possible infectious diarrhea. ? ?Chronic anemia ?Slight drop but mostly stable. ?Follow CBC closely as outpatient.. ? ?Hypokalemia ?Resolved after repletion.  Magnesium replaced and normal. ? ? ?Body mass  index is 26.55 kg/m?. ? ? ?Consultations: ?Cardiology ? ?Procedures: ?DCCV 5/3 ? ?Discharge Instructions ?Discharge Instructions   ? ? (HEART FAILURE PATIENTS) Call MD:  Anytime you have any of the following symptoms: 1) 3 pound weight gain in 24 hours or 5 pounds in 1 week 2) shortness of breath, with or without a dry hacking cough 3) swelling in the hands, feet or stomach 4) if you have to sleep on extra pillows at night in order to breathe.   Complete by: As directed ?  ? Call MD for:  difficulty breathing, headache or visual disturbances   Complete by: As directed ?  ? Call MD for:  extreme fatigue   Complete by: As directed ?  ? Call MD for:  persistant dizziness or light-headedness   Complete by: As directed ?  ? Call MD for:  persistant nausea and vomiting   Complete by: As directed ?  ? Call MD for:  severe uncontrolled pain   Complete by: As directed ?  ? Call MD for:  temperature >100.4   Complete by: As directed ?  ? Diet - low sodium heart healthy   Complete by: As directed ?  ? Increase activity slowly   Complete by: As directed ?  ? ?  ? ?  ?Medication List  ?  ? ?  TAKE these medications   ? ?acetaminophen 500 MG tablet ?Commonly known as: TYLENOL ?Take 500 mg by mouth every 6 (six) hours as needed for mild pain or headache. ?  ?amiodarone 200 MG tablet ?Commonly known as: PACERONE ?Take 1 tab (200 mg total) twice daily for 1 week and then reduce to 1 tablet (200 mg total) once daily. ?What changed:  ?how much to take ?how to take this ?when to take this ?additional instructions ?  ?amoxicillin 500 MG capsule ?Commonly known as: AMOXIL ?Take 2,000 mg by mouth See admin instructions. Take 2,000 mg by mouth 30-60 minutes before dental appointment ?  ?Eliquis 5 MG Tabs tablet ?Generic drug: apixaban ?Take 5 mg by mouth 2 (two) times daily. ?  ?furosemide 20 MG tablet ?Commonly known as: LASIX ?Take 1 tablet (20 mg total) by mouth daily for 3 days. ?Start taking on: Feb 19, 2022 ?  ?levothyroxine 88 MCG  tablet ?Commonly known as: SYNTHROID ?Take 1 tablet (88 mcg total) by mouth daily before breakfast. ?  ?lidocaine-prilocaine cream ?Commonly known as: EMLA ?Apply 1 application topically daily as needed. ?What chan

## 2022-02-18 NOTE — Assessment & Plan Note (Addendum)
Lasix discussion as noted above. ?Overnight 5/3 had some dyspnea associated with cough and clear frothy sputum.  Has had some cough since her previous COVID. ?Chest x-ray showed questionable developing patchy opacities in the right upper lobe.  Agree with nocturnist TRH MD that picture is more consistent with pulmonary edema than pneumonia in the absence of other symptoms, fever and negative procalcitonin. ?Diuretic management as above ?Could consider repeating chest x-ray in 4 weeks as outpatient to ensure resolution of above findings. ?

## 2022-02-18 NOTE — Discharge Instructions (Addendum)
Additional Discharge Instructions ? ? ?Please get your medications reviewed and adjusted by your Primary MD. ? ?Please request your Primary MD to go over all Hospital Tests and Procedure/Radiological results at the follow up, please get all Hospital records sent to your Prim MD by signing hospital release before you go home. ? ?If you had Pneumonia of Lung problems at the Hospital: ?Please get a 2 view Chest X ray done in approximately 4 weeks after hospital discharge or sooner if instructed by your Primary MD. ? ?If you have Congestive Heart Failure: ?Please call your Cardiologist or Primary MD anytime you have any of the following symptoms:  ?1) 3 pound weight gain in 24 hours or 5 pounds in 1 week  ?2) shortness of breath, with or without a dry hacking cough  ?3) swelling in the hands, feet or stomach  ?4) if you have to sleep on extra pillows at night in order to breathe ? ?Follow cardiac low salt diet and 1.5 lit/day fluid restriction. ? ?If you have diabetes ?Accuchecks 4 times/day, Once in AM empty stomach and then before each meal. ?Log in all results and show them to your primary doctor at your next visit. ?If any glucose reading is under 80 or above 300 call your primary MD immediately. ? ?If you have Seizure/Convulsions/Epilepsy: ?Please do not drive, operate heavy machinery, participate in activities at heights or participate in high speed sports until you have seen by Primary MD or a Neurologist and advised to do so again. ?Per [facility] statutes, patients with seizures are not allowed to drive until they have been seizure-free for six months.  ?Use caution when using heavy equipment or power tools. Avoid working on ladders or at heights. Take showers instead of baths. Ensure the water temperature is not too high on the home water heater. Do not go swimming alone. Do not lock yourself in a room alone (i.e. bathroom). When caring for infants or small children, sit down when holding, feeding, or  changing them to minimize risk of injury to the child in the event you have a seizure. Maintain good sleep hygiene. Avoid alcohol.  ? ?If you had Gastrointestinal Bleeding: ?Please ask your Primary MD to check a complete blood count within one week of discharge or at your next visit. Your endoscopic/colonoscopic biopsies that are pending at the time of discharge, will also need to followed by your Primary MD. ? ?Get Medicines reviewed and adjusted. ?Please take all your medications with you for your next visit with your Primary MD ? ?Please request your Primary MD to go over all hospital tests and procedure/radiological results at the follow up, please ask your Primary MD to get all Hospital records sent to his/her office. ? ?If you experience worsening of your admission symptoms, develop shortness of breath, life threatening emergency, suicidal or homicidal thoughts you must seek medical attention immediately by calling 911 or calling your MD immediately  if symptoms less severe. ? ?You must read complete instructions/literature along with all the possible adverse reactions/side effects for all the Medicines you take and that have been prescribed to you. Take any new Medicines after you have completely understood and accpet all the possible adverse reactions/side effects.  ? ?Do not drive or operate heavy machinery when taking Pain medications.  ? ?Do not take more than prescribed Pain, Sleep and Anxiety Medications ? ?Special Instructions: If you have smoked or chewed Tobacco  in the last 2 yrs please stop smoking, stop any  regular Alcohol  and or any Recreational drug use. ? ?Wear Seat belts while driving. ? ?Please note ?You were cared for by a hospitalist during your hospital stay. If you have any questions about your discharge medications or the care you received while you were in the hospital after you are discharged, you can call the unit and asked to speak with the hospitalist on call if the hospitalist  that took care of you is not available. Once you are discharged, your primary care physician will handle any further medical issues. Please note that NO REFILLS for any discharge medications will be authorized once you are discharged, as it is imperative that you return to your primary care physician (or establish a relationship with a primary care physician if you do not have one) for your aftercare needs so that they can reassess your need for medications and monitor your lab values. ? ?You can reach the hospitalist office at phone [phone] or fax [phone] ?  ?If you do not have a primary care physician, you can call [phone] for a physician referral. ? ? ?[facility] THYROID LEVELS MONITORED, NEWLY STARTED ON AMIODARONE WHICH YOU HAD A PROBLEM WITH IN THE PAST .  ?[facility] TO HAVE A REFERRAL TO HEMATOLOGIST TO EVALUATE LEUKOPENIA   ? ?

## 2022-02-18 NOTE — Evaluation (Signed)
Physical Therapy Evaluation ?Patient Details ?Name: Carrie [name] ?MRN: [id number] ?DOB: 06-May-1941 ?Today's Date: 02/18/2022 ? ?History of Present Illness ? 81 y.o. female admitted 5/2 with weakness, fatigue and cough with AFib with RVR. 5/3 electrical cardioversion. PMhx: PAF, CAD, follicular lymphoma, chronic cough post Covid, HTN, back surgery  ?Clinical Impression ? PT pleasant and wanting to get OOB. Pt reports no supplemental O2 use at home with sats 90-94% on RA during session. Pt lives alone but states friend Carrie [name] can stay with her while she feels less than independent. Pt only performing in room mobility and denied further due to fatigue. Pt with decreased activity tolerance and function who will benefit from acute therapy to maximize mobility and safety.  ? ?HR 84 on portable pulse ox as tele in room not reading accurately   ?   ? ?Recommendations for follow up therapy are one component of a multi-disciplinary discharge planning process, led by the attending physician.  Recommendations may be updated based on patient status, additional functional criteria and insurance authorization. ? ?Follow Up Recommendations Home health PT ? ?  ?Assistance Recommended at Discharge Intermittent Supervision/Assistance  ?Patient can return home with the following ? Assistance with cooking/housework;A little help with walking and/or transfers ? ?  ?Equipment Recommendations None recommended by PT  ?Recommendations for Other Services ?    ?  ?Functional Status Assessment Patient has had a recent decline in their functional status and demonstrates the ability to make significant improvements in function in a reasonable and predictable amount of time.  ? ?  ?Precautions / Restrictions Precautions ?Precautions: Other (comment) ?Precaution Comments: watch HR  ? ?  ? ?Mobility ? Bed Mobility ?Overal bed mobility: Needs Assistance ?Bed Mobility: Supine to Sit, Sit to Supine ?  ?  ?Supine to sit: Supervision ?Sit to supine:  Supervision ?  ?General bed mobility comments: supervision for lines and safety pt trying to move prior to line arrangement ?  ? ?Transfers ?Overall transfer level: Needs assistance ?  ?Transfers: Sit to/from Stand ?Sit to Stand: Min guard ?  ?  ?  ?  ?  ?General transfer comment: guarding for safety with lines, no physical assist ?  ? ?Ambulation/Gait ?Ambulation/Gait assistance: Supervision ?Gait Distance (Feet): 15 Feet ?Assistive device: None ?Gait Pattern/deviations: Step-through pattern, Decreased stride length ?  ?Gait velocity interpretation: <1.8 ft/sec, indicate of risk for recurrent falls ?  ?General Gait Details: pt with decreased stride and speed with pt walking 15' to and from bathroom with seated rest between. Denied further gait limited by fatigue ? ?Stairs ?  ?  ?  ?  ?  ? ?Wheelchair Mobility ?  ? ?Modified Rankin (Stroke Patients Only) ?  ? ?  ? ?Balance Overall balance assessment: Needs assistance ?  ?Sitting balance-Leahy Scale: Good ?Sitting balance - Comments: static sitting and donning shoes without assist ?  ?  ?Standing balance-Leahy Scale: Good ?Standing balance comment: pt able to stand and walk without physical assist ?  ?  ?  ?  ?  ?  ?  ?  ?  ?  ?  ?   ? ? ? ?Pertinent Vitals/Pain Pain Assessment ?Pain Assessment: No/denies pain  ? ? ?Home Living Family/patient expects to be discharged to:: Private residence ?Living Arrangements: Alone ?Available Help at Discharge: Family ?Type of Home: House ?Home Access: Ramped entrance ?  ?  ?  ?Home Layout: One level ?Home Equipment: Grab bars - toilet;Electric scooter;Wheelchair - manual;BSC/3in1;Shower seat - built in;Cane - single  point;Rolling Walker (2 wheels) ?Additional Comments: Pt ambulated without assistive device, drives  ?  ?Prior Function Prior Level of Function : Independent/Modified Independent ?  ?  ?  ?  ?  ?  ?  ?  ?  ? ? ?Hand Dominance  ?   ? ?  ?Extremity/Trunk Assessment  ? Upper Extremity Assessment ?Upper Extremity  Assessment: Overall WFL for tasks assessed ?  ? ?Lower Extremity Assessment ?Lower Extremity Assessment: Overall WFL for tasks assessed ?  ? ?Cervical / Trunk Assessment ?Cervical / Trunk Assessment: Normal  ?Communication  ? Communication: No difficulties  ?Cognition Arousal/Alertness: Awake/alert ?Behavior During Therapy: [facility] for tasks assessed/performed ?Overall Cognitive Status: Within Functional Limits for tasks assessed ?  ?  ?  ?  ?  ?  ?  ?  ?  ?  ?  ?  ?  ?  ?  ?  ?  ?  ?  ? ?  ?General Comments   ? ?  ?Exercises    ? ?Assessment/Plan  ?  ?PT Assessment Patient needs continued PT services  ?PT Problem List Decreased mobility;Decreased activity tolerance;Decreased balance ? ?   ?  ?PT Treatment Interventions Gait training;Functional mobility training;Therapeutic activities;Patient/family education;Balance training;Therapeutic exercise;DME instruction   ? ?PT Goals (Current goals can be found in the Care Plan section)  ?Acute Rehab PT Goals ?Patient Stated Goal: return home today ?PT Goal Formulation: With patient ?Time For Goal Achievement: 03/04/22 ?Potential to Achieve Goals: Good ? ?  ?Frequency Min 3X/week ?  ? ? ?Co-evaluation   ?  ?  ?  ?  ? ? ?  ?AM-PAC PT "6 Clicks" Mobility  ?Outcome Measure Help needed turning from your back to your side while in a flat bed without using bedrails?: None ?Help needed moving from lying on your back to sitting on the side of a flat bed without using bedrails?: None ?Help needed moving to and from a bed to a chair (including a wheelchair)?: A Little ?Help needed standing up from a chair using your arms (e.g., wheelchair or bedside chair)?: A Little ?Help needed to walk in hospital room?: A Little ?Help needed climbing 3-5 steps with a railing? : A Little ?6 Click Score: 20 ? ?  ?End of Session   ?Activity Tolerance: Patient tolerated treatment well ?Patient left: in bed;with call bell/phone within reach (pt initially to chair but then pt attempting to return to bed  immediately upon exit and assisted back to bed) ?Nurse Communication: Mobility status ?PT Visit Diagnosis: Other abnormalities of gait and mobility (R26.89);Muscle weakness (generalized) (M62.81) ?  ? ?Time: 4401-0272 ?PT Time Calculation (min) (ACUTE ONLY): 24 min ? ? ?Charges:   PT Evaluation ?$PT Eval Moderate Complexity: 1 Mod ?PT Treatments ?$Therapeutic Activity: 8-22 mins ?  ?   ? ? ?Carrie [name], PT ?Acute Rehabilitation Services ?Pager: [phone] ?Office: [phone] ? ? ?Carrie [name] ?02/18/2022, 8:26 AM ? ?

## 2022-02-19 ENCOUNTER — Encounter (HOSPITAL_COMMUNITY): Payer: Self-pay | Admitting: Cardiology

## 2022-02-19 LAB — PATHOLOGIST SMEAR REVIEW

## 2022-02-21 ENCOUNTER — Emergency Department (HOSPITAL_COMMUNITY): Payer: No Typology Code available for payment source

## 2022-02-21 ENCOUNTER — Telehealth: Payer: Self-pay | Admitting: Physician Assistant

## 2022-02-21 ENCOUNTER — Other Ambulatory Visit: Payer: Self-pay

## 2022-02-21 ENCOUNTER — Encounter (HOSPITAL_COMMUNITY): Payer: Self-pay | Admitting: Emergency Medicine

## 2022-02-21 ENCOUNTER — Inpatient Hospital Stay (HOSPITAL_COMMUNITY): Payer: No Typology Code available for payment source

## 2022-02-21 ENCOUNTER — Inpatient Hospital Stay (HOSPITAL_COMMUNITY)
Admission: EM | Admit: 2022-02-21 | Discharge: 2022-03-03 | DRG: 308 | Disposition: A | Discharge status: Skilled Nursing Facility | Payer: No Typology Code available for payment source | Attending: Internal Medicine | Admitting: Internal Medicine

## 2022-02-21 ENCOUNTER — Encounter: Payer: Self-pay | Admitting: Physician Assistant

## 2022-02-21 DIAGNOSIS — E876 Hypokalemia: Secondary | ICD-10-CM | POA: Diagnosis present

## 2022-02-21 DIAGNOSIS — U099 Post covid-19 condition, unspecified: Secondary | ICD-10-CM | POA: Diagnosis present

## 2022-02-21 DIAGNOSIS — I482 Chronic atrial fibrillation, unspecified: Secondary | ICD-10-CM | POA: Diagnosis not present

## 2022-02-21 DIAGNOSIS — I130 Hypertensive heart and chronic kidney disease with heart failure and stage 1 through stage 4 chronic kidney disease, or unspecified chronic kidney disease: Secondary | ICD-10-CM | POA: Diagnosis present

## 2022-02-21 DIAGNOSIS — E785 Hyperlipidemia, unspecified: Secondary | ICD-10-CM | POA: Diagnosis present

## 2022-02-21 DIAGNOSIS — D61818 Other pancytopenia: Secondary | ICD-10-CM | POA: Diagnosis present

## 2022-02-21 DIAGNOSIS — Z981 Arthrodesis status: Secondary | ICD-10-CM | POA: Diagnosis not present

## 2022-02-21 DIAGNOSIS — C8238 Follicular lymphoma grade IIIa, lymph nodes of multiple sites: Secondary | ICD-10-CM | POA: Diagnosis present

## 2022-02-21 DIAGNOSIS — E039 Hypothyroidism, unspecified: Secondary | ICD-10-CM | POA: Diagnosis present

## 2022-02-21 DIAGNOSIS — E8809 Other disorders of plasma-protein metabolism, not elsewhere classified: Secondary | ICD-10-CM | POA: Diagnosis present

## 2022-02-21 DIAGNOSIS — J9601 Acute respiratory failure with hypoxia: Secondary | ICD-10-CM | POA: Diagnosis not present

## 2022-02-21 DIAGNOSIS — D849 Immunodeficiency, unspecified: Secondary | ICD-10-CM | POA: Diagnosis present

## 2022-02-21 DIAGNOSIS — I4892 Unspecified atrial flutter: Secondary | ICD-10-CM | POA: Diagnosis present

## 2022-02-21 DIAGNOSIS — Z801 Family history of malignant neoplasm of trachea, bronchus and lung: Secondary | ICD-10-CM

## 2022-02-21 DIAGNOSIS — Z90710 Acquired absence of both cervix and uterus: Secondary | ICD-10-CM

## 2022-02-21 DIAGNOSIS — Z96612 Presence of left artificial shoulder joint: Secondary | ICD-10-CM | POA: Diagnosis present

## 2022-02-21 DIAGNOSIS — E1122 Type 2 diabetes mellitus with diabetic chronic kidney disease: Secondary | ICD-10-CM | POA: Diagnosis present

## 2022-02-21 DIAGNOSIS — E871 Hypo-osmolality and hyponatremia: Secondary | ICD-10-CM | POA: Diagnosis not present

## 2022-02-21 DIAGNOSIS — R7401 Elevation of levels of liver transaminase levels: Secondary | ICD-10-CM | POA: Diagnosis present

## 2022-02-21 DIAGNOSIS — I5020 Unspecified systolic (congestive) heart failure: Secondary | ICD-10-CM | POA: Diagnosis present

## 2022-02-21 DIAGNOSIS — I951 Orthostatic hypotension: Secondary | ICD-10-CM | POA: Diagnosis not present

## 2022-02-21 DIAGNOSIS — Z79899 Other long term (current) drug therapy: Secondary | ICD-10-CM | POA: Diagnosis not present

## 2022-02-21 DIAGNOSIS — Z7901 Long term (current) use of anticoagulants: Secondary | ICD-10-CM | POA: Diagnosis not present

## 2022-02-21 DIAGNOSIS — G4700 Insomnia, unspecified: Secondary | ICD-10-CM | POA: Diagnosis present

## 2022-02-21 DIAGNOSIS — N1832 Chronic kidney disease, stage 3b: Secondary | ICD-10-CM | POA: Diagnosis present

## 2022-02-21 DIAGNOSIS — J189 Pneumonia, unspecified organism: Secondary | ICD-10-CM | POA: Diagnosis present

## 2022-02-21 DIAGNOSIS — R54 Age-related physical debility: Secondary | ICD-10-CM | POA: Diagnosis present

## 2022-02-21 DIAGNOSIS — Z888 Allergy status to other drugs, medicaments and biological substances status: Secondary | ICD-10-CM

## 2022-02-21 DIAGNOSIS — I4819 Other persistent atrial fibrillation: Principal | ICD-10-CM | POA: Diagnosis present

## 2022-02-21 DIAGNOSIS — R7989 Other specified abnormal findings of blood chemistry: Secondary | ICD-10-CM | POA: Diagnosis present

## 2022-02-21 DIAGNOSIS — I4891 Unspecified atrial fibrillation: Secondary | ICD-10-CM | POA: Diagnosis present

## 2022-02-21 DIAGNOSIS — I272 Pulmonary hypertension, unspecified: Secondary | ICD-10-CM | POA: Diagnosis present

## 2022-02-21 DIAGNOSIS — I5033 Acute on chronic diastolic (congestive) heart failure: Secondary | ICD-10-CM | POA: Diagnosis not present

## 2022-02-21 DIAGNOSIS — Z807 Family history of other malignant neoplasms of lymphoid, hematopoietic and related tissues: Secondary | ICD-10-CM

## 2022-02-21 DIAGNOSIS — I5030 Unspecified diastolic (congestive) heart failure: Secondary | ICD-10-CM | POA: Diagnosis present

## 2022-02-21 DIAGNOSIS — Z79890 Hormone replacement therapy (postmenopausal): Secondary | ICD-10-CM

## 2022-02-21 DIAGNOSIS — D630 Anemia in neoplastic disease: Secondary | ICD-10-CM | POA: Diagnosis present

## 2022-02-21 DIAGNOSIS — Z96651 Presence of right artificial knee joint: Secondary | ICD-10-CM | POA: Diagnosis present

## 2022-02-21 DIAGNOSIS — I959 Hypotension, unspecified: Secondary | ICD-10-CM

## 2022-02-21 DIAGNOSIS — Z66 Do not resuscitate: Secondary | ICD-10-CM | POA: Diagnosis present

## 2022-02-21 DIAGNOSIS — I2510 Atherosclerotic heart disease of native coronary artery without angina pectoris: Secondary | ICD-10-CM | POA: Diagnosis present

## 2022-02-21 DIAGNOSIS — Z803 Family history of malignant neoplasm of breast: Secondary | ICD-10-CM

## 2022-02-21 DIAGNOSIS — I5032 Chronic diastolic (congestive) heart failure: Secondary | ICD-10-CM | POA: Diagnosis present

## 2022-02-21 LAB — COMPREHENSIVE METABOLIC PANEL
ALT: 56 U/L — ABNORMAL HIGH (ref 0–44)
AST: 92 U/L — ABNORMAL HIGH (ref 15–41)
Albumin: 2.2 g/dL — ABNORMAL LOW (ref 3.5–5.0)
Alkaline Phosphatase: 104 U/L (ref 38–126)
Anion gap: 10 (ref 5–15)
BUN: 15 mg/dL (ref 8–23)
CO2: 24 mmol/L (ref 22–32)
Calcium: 7.4 mg/dL — ABNORMAL LOW (ref 8.9–10.3)
Chloride: 97 mmol/L — ABNORMAL LOW (ref 98–111)
Creatinine, Ser: 1.22 mg/dL — ABNORMAL HIGH (ref 0.44–1.00)
GFR, Estimated: 45 mL/min — ABNORMAL LOW (ref >60)
Glucose, Bld: 216 mg/dL — ABNORMAL HIGH (ref 70–99)
Potassium: 5.1 mmol/L (ref 3.5–5.1)
Sodium: 131 mmol/L — ABNORMAL LOW (ref 135–145)
Total Bilirubin: 2 mg/dL — ABNORMAL HIGH (ref 0.3–1.2)
Total Protein: 4.6 g/dL — ABNORMAL LOW (ref 6.5–8.1)

## 2022-02-21 LAB — URINALYSIS, ROUTINE W REFLEX MICROSCOPIC
Bacteria, UA: NONE SEEN
Bilirubin Urine: NEGATIVE
Glucose, UA: NEGATIVE mg/dL
Ketones, ur: NEGATIVE mg/dL
Leukocytes,Ua: NEGATIVE
Nitrite: NEGATIVE
Protein, ur: NEGATIVE mg/dL
Specific Gravity, Urine: 1.01 (ref 1.005–1.030)
pH: 5 (ref 5.0–8.0)

## 2022-02-21 LAB — CBC WITH DIFFERENTIAL/PLATELET
Abs Immature Granulocytes: 0.13 10*3/uL — ABNORMAL HIGH (ref 0.00–0.07)
Basophils Absolute: 0 10*3/uL (ref 0.0–0.1)
Basophils Relative: 1 %
Eosinophils Absolute: 0 10*3/uL (ref 0.0–0.5)
Eosinophils Relative: 1 %
HCT: 31 % — ABNORMAL LOW (ref 36.0–46.0)
Hemoglobin: 9.9 g/dL — ABNORMAL LOW (ref 12.0–15.0)
Immature Granulocytes: 9 %
Lymphocytes Relative: 6 %
Lymphs Abs: 0.1 10*3/uL — ABNORMAL LOW (ref 0.7–4.0)
MCH: 28.8 pg (ref 26.0–34.0)
MCHC: 31.9 g/dL (ref 30.0–36.0)
MCV: 90.1 fL (ref 80.0–100.0)
Monocytes Absolute: 0.2 10*3/uL (ref 0.1–1.0)
Monocytes Relative: 13 %
Neutro Abs: 1 10*3/uL — ABNORMAL LOW (ref 1.7–7.7)
Neutrophils Relative %: 70 %
Platelets: 292 10*3/uL (ref 150–400)
RBC: 3.44 MIL/uL — ABNORMAL LOW (ref 3.87–5.11)
RDW: 14.5 % (ref 11.5–15.5)
WBC: 1.5 10*3/uL — ABNORMAL LOW (ref 4.0–10.5)
nRBC: 0 % (ref 0.0–0.2)

## 2022-02-21 LAB — MAGNESIUM: Magnesium: 1.3 mg/dL — ABNORMAL LOW (ref 1.7–2.4)

## 2022-02-21 LAB — BRAIN NATRIURETIC PEPTIDE: B Natriuretic Peptide: 956.5 pg/mL — ABNORMAL HIGH (ref 0.0–100.0)

## 2022-02-21 LAB — TROPONIN I (HIGH SENSITIVITY)
Troponin I (High Sensitivity): 38 ng/L — ABNORMAL HIGH (ref <18)
Troponin I (High Sensitivity): 46 ng/L — ABNORMAL HIGH (ref <18)

## 2022-02-21 MED ORDER — LEVOTHYROXINE SODIUM 88 MCG PO TABS
88.0000 ug | ORAL_TABLET | Freq: Every day | ORAL | Status: DC
  Administered 2022-02-22 – 2022-03-03 (×10): 88 ug via ORAL
  Filled 2022-02-21 (×10): qty 1

## 2022-02-21 MED ORDER — SODIUM CHLORIDE 0.9% FLUSH
3.0000 mL | Freq: Two times a day (BID) | INTRAVENOUS | Status: DC
  Administered 2022-02-21 – 2022-03-03 (×21): 3 mL via INTRAVENOUS

## 2022-02-21 MED ORDER — FLUTICASONE FUROATE-VILANTEROL 200-25 MCG/ACT IN AEPB: 1.0000 | INHALATION_SPRAY | Freq: Every day | RESPIRATORY_TRACT | Status: DC

## 2022-02-21 MED ORDER — BUDESONIDE 0.25 MG/2ML IN SUSP: 0.2500 mg | Freq: Two times a day (BID) | RESPIRATORY_TRACT | Status: DC

## 2022-02-21 MED ORDER — ACETAMINOPHEN 500 MG PO TABS: 500.0000 mg | ORAL_TABLET | Freq: Four times a day (QID) | ORAL | Status: DC | PRN

## 2022-02-21 MED ORDER — ONDANSETRON HCL 4 MG/2ML IJ SOLN
4.0000 mg | Freq: Four times a day (QID) | INTRAMUSCULAR | Status: DC | PRN
Start: 2022-02-21 — End: 2022-03-04
  Filled 2022-02-21: qty 2

## 2022-02-21 MED ORDER — AMIODARONE HCL IN DEXTROSE 360-4.14 MG/200ML-% IV SOLN
30.0000 mg/h | INTRAVENOUS | Status: DC
  Administered 2022-02-21 – 2022-02-23 (×3): 30 mg/h via INTRAVENOUS
  Filled 2022-02-21 (×4): qty 200

## 2022-02-21 MED ORDER — CHLORHEXIDINE GLUCONATE CLOTH 2 % EX PADS
6.0000 | MEDICATED_PAD | Freq: Every day | CUTANEOUS | Status: DC
  Administered 2022-02-21 – 2022-03-03 (×11): 6 via TOPICAL

## 2022-02-21 MED ORDER — MAGNESIUM SULFATE 4 GM/100ML IV SOLN
4.0000 g | Freq: Once | INTRAVENOUS | Status: AC
  Administered 2022-02-21: 4 g via INTRAVENOUS
  Filled 2022-02-21: qty 100

## 2022-02-21 MED ORDER — AMIODARONE HCL IN DEXTROSE 360-4.14 MG/200ML-% IV SOLN
60.0000 mg/h | INTRAVENOUS | Status: AC
  Administered 2022-02-21: 60 mg/h via INTRAVENOUS
  Filled 2022-02-21: qty 200

## 2022-02-21 MED ORDER — METOPROLOL TARTRATE 12.5 MG HALF TABLET
12.5000 mg | ORAL_TABLET | Freq: Two times a day (BID) | ORAL | Status: DC
  Administered 2022-02-21 – 2022-02-22 (×2): 12.5 mg via ORAL
  Filled 2022-02-21 (×2): qty 1

## 2022-02-21 MED ORDER — SODIUM CHLORIDE 0.9% FLUSH: 3.0000 mL | INTRAVENOUS | Status: DC | PRN

## 2022-02-21 MED ORDER — SODIUM CHLORIDE 0.9% FLUSH
10.0000 mL | INTRAVENOUS | Status: DC | PRN
  Administered 2022-02-28: 20 mL

## 2022-02-21 MED ORDER — IPRATROPIUM BROMIDE 0.02 % IN SOLN
0.5000 mg | Freq: Four times a day (QID) | RESPIRATORY_TRACT | Status: DC
  Administered 2022-02-21: 0.5 mg via RESPIRATORY_TRACT
  Filled 2022-02-21: qty 2.5

## 2022-02-21 MED ORDER — APIXABAN 5 MG PO TABS
5.0000 mg | ORAL_TABLET | Freq: Two times a day (BID) | ORAL | Status: DC
  Administered 2022-02-21 – 2022-03-03 (×20): 5 mg via ORAL
  Filled 2022-02-21 (×20): qty 1

## 2022-02-21 MED ORDER — ALBUMIN HUMAN 25 % IV SOLN
25.0000 g | Freq: Four times a day (QID) | INTRAVENOUS | Status: AC
  Administered 2022-02-21 – 2022-02-22 (×4): 25 g via INTRAVENOUS
  Filled 2022-02-21 (×5): qty 100

## 2022-02-21 MED ORDER — ALBUMIN HUMAN 25 % IV SOLN: 25.0000 g | Freq: Four times a day (QID) | INTRAVENOUS | Status: DC

## 2022-02-21 MED ORDER — ACETAMINOPHEN 325 MG PO TABS: 650.0000 mg | ORAL_TABLET | ORAL | Status: DC | PRN

## 2022-02-21 MED ORDER — SODIUM CHLORIDE 0.9 % IV SOLN: 250.0000 mL | INTRAVENOUS | Status: DC | PRN

## 2022-02-21 NOTE — H&P (Signed)
?History and Physical  ? ? ?CATHA ONTKO MWN:[id number] DOB: 03-26-1941 DOA: 02/21/2022 ? ?PCP: [name] (Confirm with patient/family/NH records and if not entered, this has to be entered at [facility] point of entry) ?Patient coming from: Home ? ?I have personally briefly reviewed patient's old medical records in [place] ? ?Chief Complaint: Palpitations, shortness of breath, lightheadedness ? ?HPI: Carrie [name] is a 81 y.o. female with medical history significant of PAF s/p recent cardioversion, chronic diastolic CHF, non-Hodgkin's lymphoma stage IIIa temporally off chemotherapy, hypothyroidism, CKD stage IIIb, presented with recurrent palpitations and shortness of breath and lightheadedness. ? ?Patient was recently hospitalized for uncontrolled A-fib, underwent cardioversion and amiodarone p.o. loading.  Patient was discharged home 3 days ago.  Initially patient has been feeling fine, but since yesterday, patient started to have episodes of palpitations.  This morning, patient woke up with strong feeling of palpitations, shortness of breath and lightheadedness, denies any chest pains.  She also complained about loose bowel movement for last 3 days, including one episode this morning, no abdominal pain denies any blood or melena in the diarrhea. ? ?ED Course: Rapid A-fib.  Cardiology consulted and recommend IV loading of amiodarone. ? ?K5.1, creatinine 1.2, bicarb 24, albumin 2.2 ? ?Review of Systems: As per HPI otherwise 14 point review of systems negative.  ? ? ?Past Medical History:  ?Diagnosis Date  ? A-fib ([place])   ? Anemia   ? pt denies   ? Atrial fibrillation with rapid ventricular response ([place]) 06/25/2015  ? Carpal tunnel syndrome, bilateral   ? pt denies   ? Cervical spondylosis without myelopathy 10/25/2013  ? Dental bridge present   ? DJD (degenerative joint disease)   ? Dyspnea   ? sometimes hx of cancer non hodgkins lymphoma   ? Dysrhythmia   ? WENT INTO A FIB IN 2017   ? Elevated  troponin 02/18/2015  ? Follicular lymphoma grade 3a ([place]) 02/03/2015  ? hx of nonhodgkins lymphoma x 3   ? Headache   ? sometimes a headache   ? History of echocardiogram   ? Echo 12/16: EF 60-65%, no RWMA, severe LAE  ? History of nuclear stress test   ? Myoview 1/17: EF 55%, Normal study. No ischemia or scar.  ? Hypertension   ? Hypothyroid   ? Nausea without vomiting 06/20/2015  ? Stage III chronic kidney disease ([place]) 07/01/2015  ? Thrush of mouth and esophagus ([place]) 06/25/2015  ? ? ?Past Surgical History:  ?Procedure Laterality Date  ? ABDOMINAL HYSTERECTOMY    ? APPENDECTOMY    ? BACK SURGERY    ? X5-lumbar-fusion  ? CARDIOVERSION N/A 02/17/2022  ? Procedure: CARDIOVERSION;  Surgeon: Berniece Salines, DO;  Location: [facility];  Service: Cardiovascular;  Laterality: N/A;  ? COLONOSCOPY    ? DILATION AND CURETTAGE OF UTERUS    ? LYMPH NODE BIOPSY Right 01/21/2015  ? Procedure: RIGHT GROIN LYMPH NODE BIOPSY;  Surgeon: Erroll Luna, MD;  Location: [place];  Service: General;  Laterality: Right;  ? MASS EXCISION Left 08/29/2018  ? Procedure: EXCISION LEFT BACK  MASS;  Surgeon: Erroll Luna, MD;  Location: [place];  Service: General;  Laterality: Left;  ? Ovarian cyst resection    ? patelar tendon transplants    ? Left/right  ? PORTACATH PLACEMENT Right 02/13/2015  ? Procedure: INSERTION PORT-A-CATH WITH ULTRASOUND;  Surgeon: Erroll Luna, MD;  Location: [facility];  Service: General;  Laterality: Right;  ? PORTACATH PLACEMENT  N/A 08/29/2018  ? Procedure: INSERTION PORT-A-CATH WITH ULTRA SOUND ERAS PATHWAY;  Surgeon: Erroll Luna, MD;  Location: [place];  Service: General;  Laterality: N/A;  ? REVERSE SHOULDER ARTHROPLASTY Left 03/02/2021  ? Procedure: REVERSE SHOULDER ARTHROPLASTY;  Surgeon: Netta Cedars, MD;  Location: [facility];  Service: Orthopedics;  Laterality: Left;  ? RIGHT/LEFT HEART CATH AND CORONARY ANGIOGRAPHY N/A 01/15/2022  ? Procedure: RIGHT/LEFT HEART CATH AND CORONARY ANGIOGRAPHY;  Surgeon:  [name], MD;  Location: [place] [facility];  Service: Cardiovascular;  Laterality: N/A;  ? TOTAL KNEE ARTHROPLASTY  2011  ? Right  ? TUBAL LIGATION    ? ? ? reports that she has never smoked. She has never used smokeless tobacco. She reports that she does not currently use alcohol. She reports that she does not use drugs. ? ?Allergies  ?Allergen Reactions  ? Amiodarone Other (See Comments)  ?  "Hyperthyroidism"....BUT, the patient is taking this (2023)  ? ? ?Family History  ?Problem Relation Age of Onset  ? Cancer Mother   ?     Breast, lung NHL  ? Cancer Sister   ?     Multiple myeloma  ? ? ? ?Prior to Admission medications   ?Medication Sig Start Date End Date Taking? Authorizing Provider  ?acetaminophen (TYLENOL) 500 MG tablet Take 500 mg by mouth every 6 (six) hours as needed for mild pain or headache.    [provider]  ?amiodarone (PACERONE) 200 MG tablet Take 1 tab (200 mg total) twice daily for 1 week and then reduce to 1 tablet (200 mg total) once daily. 02/18/22   Hongalgi, Lenis Dickinson, MD  ?amoxicillin (AMOXIL) 500 MG capsule Take 2,000 mg by mouth See admin instructions. Take 2,000 mg by mouth 30-60 minutes before dental appointment 06/30/21   [provider]  ?apixaban (ELIQUIS) 5 MG TABS tablet Take 5 mg by mouth 2 (two) times daily.     [provider]  ?furosemide (LASIX) 20 MG tablet Take 1 tablet (20 mg total) by mouth daily for 3 days. 02/19/22 02/22/22  Modena Jansky, MD  ?levothyroxine (SYNTHROID) 88 MCG tablet Take 1 tablet (88 mcg total) by mouth daily before breakfast. 01/24/22   Lavina Hamman, MD  ?lidocaine-prilocaine (EMLA) cream Apply 1 application topically daily as needed. ?Patient taking differently: Apply 1 application. topically daily as needed (for port access). 04/07/21   [name], MD  ?polyvinyl alcohol (LIQUIFILM TEARS) 1.4 % ophthalmic solution Place 1 drop into both eyes daily as needed for dry eyes.    [provider]  ? ? ?Physical  Exam: ?Vitals:  ? 02/21/22 1340 02/21/22 1341 02/21/22 1342 02/21/22 1430  ?BP:  105/67  105/87  ?Pulse:  100  96  ?Resp:  (!) 22  (!) 22  ?Temp:  98.3 ?F (36.8 ?C)    ?TempSrc:  Oral    ?SpO2: 98% 94%  93%  ?Weight:   63.7 kg   ?Height:   _0  (1.549 m)   ? ? ?Constitutional: NAD, calm, comfortable ?Vitals:  ? 02/21/22 1340 02/21/22 1341 02/21/22 1342 02/21/22 1430  ?BP:  105/67  105/87  ?Pulse:  100  96  ?Resp:  (!) 22  (!) 22  ?Temp:  98.3 ?F (36.8 ?C)    ?TempSrc:  Oral    ?SpO2: 98% 94%  93%  ?Weight:   63.7 kg   ?Height:   _1  (1.549 m)   ? ?Eyes: PERRL, lids and conjunctivae normal ?ENMT:  Mucous membranes are moist. Posterior pharynx clear of any exudate or lesions.Normal dentition.  ?Neck: normal, supple, no masses, no thyromegaly ?Respiratory: clear to auscultation bilaterally, no wheezing, no crackles. Normal respiratory effort. No accessory muscle use.  ?Cardiovascular: Regular rate and rhythm, no murmurs / rubs / gallops. No extremity edema. 2+ pedal pulses. No carotid bruits.  ?Abdomen: no tenderness, no masses palpated. No hepatosplenomegaly. Bowel sounds positive.  ?Musculoskeletal: no clubbing / cyanosis. No joint deformity upper and lower extremities. Good ROM, no contractures. Normal muscle tone.  ?Skin: no rashes, lesions, ulcers. No induration ?Neurologic: CN 2-12 grossly intact. Sensation intact, DTR normal. Strength 5/5 in all 4.  ?Psychiatric: Normal judgment and insight. Alert and oriented x 3. Normal mood.  ? ?(Anything < 9 systems with 2 bullets each down codes to level 1) ?(If patient refuses exam can?t bill higher level) ?(Make sure to document decubitus ulcers present on admission -- if possible -- and whether patient has chronic indwelling catheter at time of admission) ? ?Labs on Admission: I have personally reviewed following labs and imaging studies ? ?CBC: ?Recent Labs  ?Lab 02/16/22 ?1441 02/17/22 ?0328 02/18/22 ?2751 02/21/22 ?1338  ?WBC 1.6* 1.7* 1.4* 1.5*  ?NEUTROABS  --   --   0.8* 1.0*  ?HGB 9.6* 9.5* 8.9* 9.9*  ?HCT 29.8* 29.2* 26.8* 31.0*  ?MCV 90.6 91.5 90.2 90.1  ?PLT 274 234 245 292  ? ?Basic Metabolic Panel: ?Recent Labs  ?Lab 02/16/22 ?1441 02/16/22 ?2002 02/17/22 ?7001 02/17/22

## 2022-02-21 NOTE — Progress Notes (Signed)
Mg=1.3, probably GI loss from the diarrhea. 4 gm IV Mg ordered. ? ?RUQ U/S negative  ?

## 2022-02-21 NOTE — ED Triage Notes (Signed)
Pt BIB EMS called out for "flutter in chest" and ShOB. Found to be in a-fib RVR at 140. 10 of cardizem given. HR now 90-100.  ?

## 2022-02-21 NOTE — ED Provider Notes (Signed)
?[facility] ?Provider Note ? ? ?CSN: [id number] ?Arrival date & time: 02/21/22  1332 ? ?  ? ?History ? ?Chief Complaint  ?Patient presents with  ? Chest Pain  ? Shortness of Breath  ? ? ?Carrie [name] is a 81 y.o. female. ? ?81 year old female with prior medical history as detailed below presents for evaluation. ? ?Patient with known history of atrial fibrillation.  Patient with recent admission for same.  Patient reports admission last week for A-fib with RVR.  She was placed on amiodarone and then had cardioversion performed as an inpatient. ? ?Patient reports that she has been doing well since discharge.  This morning around 10 AM she developed rapid heart rate.  She felt uncomfortable with a rapid heart rate.  She finally called EMS who noted that she was in A-fib with RVR with initial heart rate of 140.  EMS administered 10 mg of Cardizem.  Patient is feeling improved upon arrival.  Heart rate is 100. ? ? ? ? ?The history is provided by the patient and medical records.  ?Illness ?Location:  A-fib with RVR ?Severity:  Mild ?Onset quality:  Sudden ?Duration:  3 hours ?Timing:  Rare ?Progression:  Unable to specify ?Chronicity:  Recurrent ? ?  ? ?Home Medications ?Prior to Admission medications   ?Medication Sig Start Date End Date Taking? Authorizing Provider  ?acetaminophen (TYLENOL) 500 MG tablet Take 500 mg by mouth every 6 (six) hours as needed for mild pain or headache.    [provider]  ?amiodarone (PACERONE) 200 MG tablet Take 1 tab (200 mg total) twice daily for 1 week and then reduce to 1 tablet (200 mg total) once daily. 02/18/22   Hongalgi, Lenis Dickinson, MD  ?amoxicillin (AMOXIL) 500 MG capsule Take 2,000 mg by mouth See admin instructions. Take 2,000 mg by mouth 30-60 minutes before dental appointment 06/30/21   [provider]  ?apixaban (ELIQUIS) 5 MG TABS tablet Take 5 mg by mouth 2 (two) times daily.     [provider]  ?furosemide  (LASIX) 20 MG tablet Take 1 tablet (20 mg total) by mouth daily for 3 days. 02/19/22 02/22/22  Modena Jansky, MD  ?levothyroxine (SYNTHROID) 88 MCG tablet Take 1 tablet (88 mcg total) by mouth daily before breakfast. 01/24/22   Lavina Hamman, MD  ?lidocaine-prilocaine (EMLA) cream Apply 1 application topically daily as needed. ?Patient taking differently: Apply 1 application. topically daily as needed (for port access). 04/07/21   [name], MD  ?polyvinyl alcohol (LIQUIFILM TEARS) 1.4 % ophthalmic solution Place 1 drop into both eyes daily as needed for dry eyes.    [provider]  ?   ? ?Allergies    ?Amiodarone   ? ?Review of Systems   ?Review of Systems  ?All other systems reviewed and are negative. ? ?Physical Exam ?Updated Vital Signs ?BP 105/87   Pulse 96   Temp 98.3 ?F (36.8 ?C) (Oral)   Resp (!) 22   Ht '5\' 1"'$  (1.549 m)   Wt 63.7 kg   SpO2 93%   BMI 26.55 kg/m?  ?Physical Exam ?Vitals and nursing note reviewed.  ?Constitutional:   ?   General: She is not in acute distress. ?   Appearance: Normal appearance. She is well-developed.  ?HENT:  ?   Head: Normocephalic and atraumatic.  ?Eyes:  ?   Conjunctiva/sclera: Conjunctivae normal.  ?   Pupils: Pupils are equal, round, and reactive to light.  ?Cardiovascular:  ?  Rate and Rhythm: Tachycardia present. Rhythm irregular.  ?   Heart sounds: Normal heart sounds.  ?Pulmonary:  ?   Effort: Pulmonary effort is normal. No respiratory distress.  ?   Breath sounds: Normal breath sounds.  ?Abdominal:  ?   General: There is no distension.  ?   Palpations: Abdomen is soft.  ?   Tenderness: There is no abdominal tenderness.  ?Musculoskeletal:     ?   General: No deformity. Normal range of motion.  ?   Cervical back: Normal range of motion and neck supple.  ?Skin: ?   General: Skin is warm and dry.  ?Neurological:  ?   General: No focal deficit present.  ?   Mental Status: She is alert and oriented to person, place, and time.  ? ? ?ED Results / Procedures  / Treatments   ?Labs ?(all labs ordered are listed, but only abnormal results are displayed) ?Labs Reviewed  ?COMPREHENSIVE METABOLIC PANEL - Abnormal; Notable for the following components:  ?    Result Value  ? Sodium 131 (*)   ? Chloride 97 (*)   ? Glucose, Bld 216 (*)   ? Creatinine, Ser 1.22 (*)   ? Calcium 7.4 (*)   ? Total Protein 4.6 (*)   ? Albumin 2.2 (*)   ? AST 92 (*)   ? ALT 56 (*)   ? Total Bilirubin 2.0 (*)   ? GFR, Estimated 45 (*)   ? All other components within normal limits  ?CBC WITH DIFFERENTIAL/PLATELET - Abnormal; Notable for the following components:  ? WBC 1.5 (*)   ? RBC 3.44 (*)   ? Hemoglobin 9.9 (*)   ? HCT 31.0 (*)   ? Neutro Abs 1.0 (*)   ? Lymphs Abs 0.1 (*)   ? Abs Immature Granulocytes 0.13 (*)   ? All other components within normal limits  ?URINALYSIS, ROUTINE W REFLEX MICROSCOPIC - Abnormal; Notable for the following components:  ? Hgb urine dipstick SMALL (*)   ? All other components within normal limits  ?TROPONIN I (HIGH SENSITIVITY) - Abnormal; Notable for the following components:  ? Troponin I (High Sensitivity) 38 (*)   ? All other components within normal limits  ?BRAIN NATRIURETIC PEPTIDE  ?MAGNESIUM  ? ? ?EKG ?EKG Interpretation ? ?Date/Time:  Sunday Feb 21 2022 13:41:49 EDT ?Ventricular Rate:  103 ?PR Interval:    ?QRS Duration: 97 ?QT Interval:  388 ?QTC Calculation: 459 ?R Axis:   261 ?Text Interpretation: Atrial fibrillation Probable anterior infarct, age indeterminate Artifact in lead(s) I III aVR aVL aVF V1 V3 V4 V5 V6 Confirmed by Dene Gentry [phone]) on 02/21/2022 1:47:50 PM ? ?Radiology ?DG Chest Port 1 View ? ?Result Date: 02/21/2022 ?CLINICAL DATA:  Flutter in chest.  Shortness of breath. EXAM: PORTABLE CHEST 1 VIEW COMPARISON:  Feb 18, 2022 FINDINGS: Stable thickening the right fissure, possibly due to pleural thickening or a small amount of fluid. Stable cardiomegaly. The hila and mediastinum are unremarkable. Stable right Port-A-Cath. No pneumothorax. No overt  edema. No other acute abnormalities. IMPRESSION: No significant interval change. Stable cardiomegaly. No overt edema. Electronically Signed   By: Dorise Bullion III M.D.   On: 02/21/2022 14:02   ? ?Procedures ?Procedures  ? ? ?Medications Ordered in ED ?Medications  ?amiodarone (NEXTERONE PREMIX) 360-4.14 MG/200ML-% (1.8 mg/mL) IV infusion (60 mg/hr Intravenous New Bag/Given 02/21/22 1412)  ?amiodarone (NEXTERONE PREMIX) 360-4.14 MG/200ML-% (1.8 mg/mL) IV infusion (has no administration in time range)  ?ipratropium (ATROVENT)  nebulizer solution 0.5 mg (has no administration in time range)  ?albumin human 25 % solution 25 g (has no administration in time range)  ?acetaminophen (TYLENOL) tablet 500 mg (has no administration in time range)  ?levothyroxine (SYNTHROID) tablet 88 mcg (has no administration in time range)  ?apixaban (ELIQUIS) tablet 5 mg (has no administration in time range)  ?sodium chloride flush (NS) 0.9 % injection 3 mL (has no administration in time range)  ?sodium chloride flush (NS) 0.9 % injection 3 mL (has no administration in time range)  ?0.9 %  sodium chloride infusion (has no administration in time range)  ?acetaminophen (TYLENOL) tablet 650 mg (has no administration in time range)  ?ondansetron (ZOFRAN) injection 4 mg (has no administration in time range)  ? ? ?ED Course/ Medical Decision Making/ A&P ?  ?                        ?Medical Decision Making ?Amount and/or Complexity of Data Reviewed ?Labs: ordered. ?Radiology: ordered. ? ?Risk ?Prescription drug management. ?Decision regarding hospitalization. ? ? ? ?Medical Screen Complete ? ?This patient presented to the ED with complaint of atrial fibrillation. ? ?This complaint involves an extensive number of treatment options. The initial differential diagnosis includes, but is not limited to, atrial fibrillation ? ?This presentation is: Acute, Chronic, Self-Limited, Previously Undiagnosed, Uncertain Prognosis, Complicated, Systemic Symptoms,  and Threat to Life/Bodily Function ? ?Patient with history of atrial fibrillation.  She is on current anticoagulation for same. ? ?Recent admission for same last week.  She was placed on amiodarone drip and then

## 2022-02-21 NOTE — Plan of Care (Incomplete)
Agree with assessment of Carrie [name] APP/PA. I have reviewed all pertinent hemodynamic, laboratory, and cardiac studies. We discussed the plan.  ? ?Carrie [name] is a 81 y.o. female with a hx of hypertension, follicular lymphoma on rituximab, paroxysmal atrial fibrillation on amiodarone, HFimpEF (heart failure with improved ejection fraction), minimal non-obstructive coronary artery disease on cath in March 2023, hypothyroidism, chronic kidney disease, aortic atherosclerosis who is being seen 02/21/2022 for the evaluation of atrial fibrillation at the request of Carrie [name]. Presents with afib with RVR per below. She is not in decompensated CHF.  She was started on amiodarone in the past. Did not tolerate diltiazem with hypotension. Will plan to continue IV amiodarone. Can add metop tartrate 12.5 mg BID. Don't have a lot of room for BB. Can uptitrate as she tolerates. Will plan for EP to see her with recent unsuccessful DCCV on amiodarone. She's continued on eliquis.  May consider higher oral loading dose + BB.  ? ? ? ?

## 2022-02-21 NOTE — Telephone Encounter (Signed)
This encounter was created in error - please disregard.

## 2022-02-21 NOTE — Consult Note (Signed)
?Cardiology Consultation:  ? ?Patient ID: Carrie [name] ?MRN: [id number]; DOB: 26-Jun-1941 ? ?Admit date: 02/21/2022 ?Date of Consult: 02/21/2022 ? ?PCP:  [name] ?  ?[facility] Providers ?Cardiologist:  [name], MD      ? ? ?Patient Profile:  ? ?Carrie [name] is a 81 y.o. female with a hx of hypertension, follicular lymphoma, paroxysmal atrial fibrillation on amiodarone, HFimpEF (heart failure with improved ejection fraction), minimal non-obstructive coronary artery disease on cath in March 2023, hypothyroidism, chronic kidney disease, aortic atherosclerosis who is being seen 02/21/2022 for the evaluation of atrial fibrillation at the request of Dr. Roosevelt Locks. ? ?History of Present Illness:  ? ?Carrie [name] had recurrent atrial fibrillation in Nov 2022 and underwent DCCV.  She had recurrent AFib in March 2023 and was admitted.  She had some ?WMA on echocardiogram and a cardiac catheterization was done which demonstrated minimal non-obs CAD.  She was then admitted in April 2023 with AF with RVR.  She developed hypotension with Diltiazem and was placed on Amiodarone.  She was back in NSR when seen for f/u in the office on 02/02/22.   ? ?R/L Cardiac catheterization 01/15/2022: RCA proximal 15; PCWP 11, mean PA 21, Fick CO 6.09; EF 50-55 ?  ?Echocardiogram 12/31/21:  Inf HK, EF 50-55, normal PASP 38, normal RVSF, mild LAE, mild MR, mild to mod TR, mild AI, AV sclerosis w/o AS ? ?She was admitted again with AF with RVR 5/2-5/4 2023.  She presented with diarrhea and there was some concern that her AFib was brought on by acute illness (gastroenteritis).  She was placed on Amio IV and underwent DCCV with restoration of NSR.  She has f/u in the AFib clinic 02/25/22 already arranged.  She was DC on Eliquis, Lasix for 3 days and Amiodarone. ? ?She presented back to the ED today with recurrent AF with RVR. Her daughter contacted me via the answering service prior to being consulted.  She is concerned that the  patient is having recurrent AFib episodes and would like to pursue alternative treatment strategies (?PVI Ablation).   ? ?She has continued to feel weak since DC.  She has also continued to have diarrhea.  She has not had fever, chest pain, significant dyspnea on exertion, syncope, leg edema.  She sleeps on an incline and describes ?orthopnea. She noted that her HR was much higher when checking her BP today and knew she was back in AFib.  ? ?Data in ED: ?Na 131, K 5.1, SCr 1.22, Alb 2.2, ALT 56, glucose 216, Hgb 9.9, WBC 1.5, Plt 292K ?BNP 956 ?hsTrop 38 ?CXR: no edema ?EKG personally reviewed and interpreted: Atrial fibrillation HR 103, increased artifact, poor R wave progression, QTc 459 ? ?Past Medical History:  ?Diagnosis Date  ? A-fib ([place])   ? Anemia   ? pt denies   ? Atrial fibrillation with rapid ventricular response ([place]) 06/25/2015  ? Carpal tunnel syndrome, bilateral   ? pt denies   ? Cervical spondylosis without myelopathy 10/25/2013  ? Dental bridge present   ? DJD (degenerative joint disease)   ? Dyspnea   ? sometimes hx of cancer non hodgkins lymphoma   ? Dysrhythmia   ? WENT INTO A FIB IN 2017   ? Elevated troponin 02/18/2015  ? Follicular lymphoma grade 3a ([place]) 02/03/2015  ? hx of nonhodgkins lymphoma x 3   ? Headache   ? sometimes a headache   ? History of echocardiogram   ? Echo  12/16: EF 60-65%, no RWMA, severe LAE  ? History of nuclear stress test   ? Myoview 1/17: EF 55%, Normal study. No ischemia or scar.  ? Hypertension   ? Hypothyroid   ? Nausea without vomiting 06/20/2015  ? Stage III chronic kidney disease ([place]) 07/01/2015  ? Thrush of mouth and esophagus ([place]) 06/25/2015  ? ? ?Past Surgical History:  ?Procedure Laterality Date  ? ABDOMINAL HYSTERECTOMY    ? APPENDECTOMY    ? BACK SURGERY    ? X5-lumbar-fusion  ? CARDIOVERSION N/A 02/17/2022  ? Procedure: CARDIOVERSION;  Surgeon: Berniece Salines, DO;  [facility];  Service: Cardiovascular;  Laterality: N/A;  ? COLONOSCOPY    ? DILATION AND  CURETTAGE OF UTERUS    ? LYMPH NODE BIOPSY Right 01/21/2015  ? Procedure: RIGHT GROIN LYMPH NODE BIOPSY;  Surgeon: Erroll Luna, MD;  Location: [facility];  Service: General;  Laterality: Right;  ? MASS EXCISION Left 08/29/2018  ? Procedure: EXCISION LEFT BACK  MASS;  Surgeon: Erroll Luna, MD;  Location: [facility];  Service: General;  Laterality: Left;  ? Ovarian cyst resection    ? patelar tendon transplants    ? Left/right  ? PORTACATH PLACEMENT Right 02/13/2015  ? Procedure: INSERTION PORT-A-CATH WITH ULTRASOUND;  Surgeon: Erroll Luna, MD;  Location: [facility];  Service: General;  Laterality: Right;  ? PORTACATH PLACEMENT N/A 08/29/2018  ? Procedure: INSERTION PORT-A-CATH WITH ULTRA SOUND ERAS PATHWAY;  Surgeon: Erroll Luna, MD;  Location: [facility];  Service: General;  Laterality: N/A;  ? REVERSE SHOULDER ARTHROPLASTY Left 03/02/2021  ? Procedure: REVERSE SHOULDER ARTHROPLASTY;  Surgeon: Netta Cedars, MD;  Location: [facility];  Service: Orthopedics;  Laterality: Left;  ? RIGHT/LEFT HEART CATH AND CORONARY ANGIOGRAPHY N/A 01/15/2022  ? Procedure: RIGHT/LEFT HEART CATH AND CORONARY ANGIOGRAPHY;  Surgeon: [name], MD;  Location: [facility];  Service: Cardiovascular;  Laterality: N/A;  ? TOTAL KNEE ARTHROPLASTY  2011  ? Right  ? TUBAL LIGATION    ?  ? ?Home Medications:  ?Prior to Admission medications   ?Medication Sig Start Date End Date Taking? Authorizing Provider  ?acetaminophen (TYLENOL) 500 MG tablet Take 500 mg by mouth every 6 (six) hours as needed for mild pain or headache.   Yes [provider]  ?amiodarone (PACERONE) 200 MG tablet Take 1 tab (200 mg total) twice daily for 1 week and then reduce to 1 tablet (200 mg total) once daily. ?Patient taking differently: Take 200 mg by mouth in the morning. 02/18/22  Yes Hongalgi, Lenis Dickinson, MD  ?amoxicillin (AMOXIL) 500 MG capsule Take 2,000 mg by mouth See admin instructions. Take 2,000 mg by mouth 30-60 minutes before dental  appointment 06/30/21  Yes [provider]  ?apixaban (ELIQUIS) 5 MG TABS tablet Take 5 mg by mouth 2 (two) times daily.    Yes [provider]  ?levothyroxine (SYNTHROID) 88 MCG tablet Take 1 tablet (88 mcg total) by mouth daily before breakfast. 01/24/22  Yes Lavina Hamman, MD  ?lidocaine-prilocaine (EMLA) cream Apply 1 application topically daily as needed. ?Patient taking differently: Apply 1 application. topically daily as needed (for port access). 04/07/21  Yes [name], MD  ?polyvinyl alcohol (LIQUIFILM TEARS) 1.4 % ophthalmic solution Place 1 drop into both eyes 3 (three) times daily as needed for dry eyes.   Yes [provider]  ?furosemide (LASIX) 20 MG tablet Take 1 tablet (20 mg total) by mouth daily for 3 days. ?Patient not  taking: Reported on 02/21/2022 02/19/22 02/22/22  Modena Jansky, MD  ? ? ?Inpatient Medications: ?Scheduled Meds: ? apixaban  5 mg Oral BID  ? ipratropium  0.5 mg Nebulization Q6H  ? [START ON 02/22/2022] levothyroxine  88 mcg Oral QAC breakfast  ? metoprolol tartrate  12.5 mg Oral BID  ? sodium chloride flush  3 mL Intravenous Q12H  ? ?Continuous Infusions: ? sodium chloride    ? albumin human    ? amiodarone 60 mg/hr (02/21/22 1412)  ? amiodarone    ? ?PRN Meds: ?sodium chloride, acetaminophen, ondansetron (ZOFRAN) IV, sodium chloride flush ? ?Allergies:    ?Allergies  ?Allergen Reactions  ? Amiodarone Other (See Comments)  ?  "Hyperthyroidism"....BUT, the patient is taking this (2023)  ? ? ?Social History:   ?Social History  ? ?Socioeconomic History  ? Marital status: Widowed  ?  Spouse name: Not on file  ? Number of children: 2  ? Years of education: hs  ? Highest education level: Not on file  ?Occupational History  ? Occupation: Retired  ?Tobacco Use  ? Smoking status: Never  ? Smokeless tobacco: Never  ?Vaping Use  ? Vaping Use: Never used  ?Substance and Sexual Activity  ? Alcohol use: Not Currently  ? Drug use: No  ? Sexual activity: Not on file  ?Other  Topics Concern  ? Not on file  ?Social History Narrative  ? Lives alone.  Has a walker for home use.  ? ?Social Determinants of Health  ? ?Financial Resource Strain: Not on file  ?Food Insecurity: Not on fil

## 2022-02-21 NOTE — Telephone Encounter (Signed)
Dauger Adrian Saran - DPR on file) called the answering service.  Her mom was brought to the ED for recurrent AFib. ?She notes they are frustrated with how often she is coming back for AFib.  She wonders what else the patient can do to manage her AFib. ? ?I advised her daughter that we can likely refer her to EP depending on what happens when she is seen for AFib during her current hospital stay. ? ?[name], PA-C    ?02/21/2022 2:04 PM   ?

## 2022-02-22 DIAGNOSIS — I482 Chronic atrial fibrillation, unspecified: Secondary | ICD-10-CM | POA: Diagnosis not present

## 2022-02-22 LAB — BASIC METABOLIC PANEL
Anion gap: 11 (ref 5–15)
BUN: 14 mg/dL (ref 8–23)
CO2: 27 mmol/L (ref 22–32)
Calcium: 8 mg/dL — ABNORMAL LOW (ref 8.9–10.3)
Chloride: 95 mmol/L — ABNORMAL LOW (ref 98–111)
Creatinine, Ser: 1.23 mg/dL — ABNORMAL HIGH (ref 0.44–1.00)
GFR, Estimated: 45 mL/min — ABNORMAL LOW (ref >60)
Glucose, Bld: 140 mg/dL — ABNORMAL HIGH (ref 70–99)
Potassium: 2.9 mmol/L — ABNORMAL LOW (ref 3.5–5.1)
Sodium: 133 mmol/L — ABNORMAL LOW (ref 135–145)

## 2022-02-22 LAB — HEPATIC FUNCTION PANEL
ALT: 54 U/L — ABNORMAL HIGH (ref 0–44)
AST: 72 U/L — ABNORMAL HIGH (ref 15–41)
Albumin: 3.2 g/dL — ABNORMAL LOW (ref 3.5–5.0)
Alkaline Phosphatase: 131 U/L — ABNORMAL HIGH (ref 38–126)
Bilirubin, Direct: 0.3 mg/dL — ABNORMAL HIGH (ref 0.0–0.2)
Indirect Bilirubin: 0.7 mg/dL (ref 0.3–0.9)
Total Bilirubin: 1 mg/dL (ref 0.3–1.2)
Total Protein: 5.5 g/dL — ABNORMAL LOW (ref 6.5–8.1)

## 2022-02-22 LAB — MAGNESIUM: Magnesium: 2.8 mg/dL — ABNORMAL HIGH (ref 1.7–2.4)

## 2022-02-22 MED ORDER — METOPROLOL TARTRATE 25 MG PO TABS
25.0000 mg | ORAL_TABLET | Freq: Two times a day (BID) | ORAL | Status: DC
Start: 2022-02-22 — End: 2022-02-23
  Administered 2022-02-22 – 2022-02-23 (×2): 25 mg via ORAL
  Filled 2022-02-22 (×2): qty 1

## 2022-02-22 MED ORDER — POTASSIUM CHLORIDE CRYS ER 20 MEQ PO TBCR
40.0000 meq | EXTENDED_RELEASE_TABLET | ORAL | Status: AC
  Administered 2022-02-22 (×2): 40 meq via ORAL
  Filled 2022-02-22 (×2): qty 2

## 2022-02-22 MED ORDER — IPRATROPIUM BROMIDE 0.02 % IN SOLN
0.5000 mg | Freq: Two times a day (BID) | RESPIRATORY_TRACT | Status: DC
  Administered 2022-02-22: 0.5 mg via RESPIRATORY_TRACT
  Filled 2022-02-22: qty 2.5

## 2022-02-22 NOTE — TOC Initial Note (Addendum)
Transition of Care (TOC) - Initial/Assessment Note  ? ? ?Patient Details  ?Name: Carrie [name] ?MRN: [id number] ?Date of Birth: 06/25/1941 ? ?Transition of Care (TOC) CM/SW Contact:    ?Graves-Bigelow, Ocie Cornfield, RN ?Phone Number: ?02/22/2022, 1:49 PM ? ?Clinical Narrative:  Risk for readmission assessment completed. PTA patient was from home. Patient has VA benefit- information submitted to [place]. Case Manager awaiting call back. Case Manager will continue to follow for disposition needs.            ? ?1436 02-22-22 Case Manager received a call back from [place]- PCP is [place] PA and CSW is Cyndee Brightly [phone]. Pager is [phone] ? ?Expected Discharge Plan: [place] ?Barriers to Discharge: Continued Medical Work up ? ? ?Patient Goals and CMS Choice ?Patient states their goals for this hospitalization and ongoing recovery are:: to return home ?  ?Choice offered to / list presented to : Patient ? ?Expected Discharge Plan and Services ?Expected Discharge Plan: [place] ?In-house Referral: NA ?Discharge Planning Services: CM Consult ?Post Acute Care Choice: Home Health ?Living arrangements for the past 2 months: [place] ?                ?  ?DME Agency: NA ?  ?  ?Prior Living Arrangements/Services ?Living arrangements for the past 2 months: [place] ?Lives with:: Self ?Patient language and need for interpreter reviewed:: Yes ?Do you feel safe going back to the place where you live?: Yes      ?  ?  ?Current home services: DME (Patient has a rolling walker.) ?Criminal Activity/Legal Involvement Pertinent to Current Situation/Hospitalization: No - Comment as needed ? ?Activities of Daily Living ?  ?  ? ?Permission Sought/Granted ?Permission sought to share information with : Family Supports, Case Manager ?  ?  Emotional Assessment ?Appearance:: Appears stated age ?  ?  ?  ?Alcohol / Substance Use:  Not Applicable ?Psych Involvement: No (comment) ? ?Admission diagnosis:  A-fib ([place]) [I48.91] ?Atrial fibrillation with RVR ([place]) [I48.91] ?Patient Active Problem List  ? Diagnosis Date Noted  ? A-fib ([place]) 02/21/2022  ? Diarrhea 02/17/2022  ? Asymptomatic bacteriuria 02/17/2022  ? Chronic anemia 02/17/2022  ? Head trauma 02/02/2022  ? HFimpEF (Heart Failure with improved EF) 02/01/2022  ? Coronary artery disease involving native coronary artery of native heart without angina pectoris 02/01/2022  ? Acute renal failure superimposed on stage 3a chronic kidney disease ([place]) 01/23/2022  ? Chronic atrial fibrillation with RVR ([place]) 01/22/2022  ? Atrial fibrillation ([place]) 01/21/2022  ? Atrial fibrillation with RVR ([place]) 01/21/2022  ? Aortic atherosclerosis ([place]) 01/10/2022  ? (HFimpEF) heart failure with improved ejection fraction ([place]) 12/30/2021  ? Headache 12/30/2021  ? Acute otitis media 10/27/2021  ? Acquired hypercoagulable state ([place]) 09/22/2021  ? Influenza A 09/15/2021  ? Cough in adult 09/09/2021  ? H/O total shoulder replacement, left 03/02/2021  ? Deficiency anemia 02/03/2021  ? Pulmonary hypertension ([place]) 10/27/2020  ? Thrombocytopenia ([place]) 09/01/2020  ? Goals of care, counseling/discussion 07/23/2020  ? Leukopenia 09/25/2019  ? Syncope 03/05/2019  ? Postural dizziness with presyncope 03/04/2019  ? Atypical chest pain   ? Near syncope   ? Skin lesions 06/06/2017  ? Shortness of breath 03/25/2017  ? Anxiety disorder 03/25/2017  ? Generalized weakness 03/19/2017  ? Insomnia disorder 03/19/2017  ? Dysuria 08/31/2016  ? Acute back pain with sciatica, right 08/31/2016  ? Chronic  back pain 07/02/2016  ? Quality of life palliative care encounter 03/02/2016  ? Port catheter in place 03/01/2016  ? Skin rash 11/03/2015  ? Hypokalemia 07/02/2015  ? Essential hypertension 07/02/2015  ? 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 Stage 3a chronic kidney disease ([place]) 06/25/2015  ? Chronic knee pain after  total replacement of right knee joint 04/09/2015  ? Chronic left shoulder pain 03/31/2015  ? Other fatigue 03/07/2015  ? Grade 3a follicular lymphoma of lymph nodes of multiple regions ([place]) 02/03/2015  ? Hypothyroidism 09/09/2009  ? ?PCP:  [name] ?Pharmacy:   ?[facility], [address] ?[phone] ?[place] [address] ?Phone: [phone] Fax: [phone] ? ?[id number] [place] [address] ?[address] ?[id number]?Phone: [phone]: [phone]? ? ? ?Readmission Risk Interventions ? ?  02/22/2022  ?  1:48 PM  ?Readmission Risk Prevention Plan  ?Transportation Screening Complete  ?Medication Review (Press photographer Complete  ?PCP or Specialist appointment within 3-5 days of discharge Complete  ?[facility] Consult Complete  ?SW Recovery Care/Counseling Consult Complete  ?Palliative Care Screening Not Applicable  ?SHavanaNot Applicable  ? ? ? ?

## 2022-02-22 NOTE — Progress Notes (Signed)
?PROGRESS NOTE ? ?Carrie [name]  ?DOB: 09-Jan-1941  ?PCP: [name] ?NIO:[id number]  ?DOA: 02/21/2022 ? LOS: 1 day  ?Hospital Day: 2 ? ?Brief narrative: ?Carrie [name] is a 81 y.o. female with PMH significant for HTN, HLD, CKD 3, pA-fib s/p recent cardioversion, chronic diastolic CHF, non-Hodgkin's lymphoma stage IIIa temporally of chemotherapy, chronic anemia, hypothyroidism ?Patient presented to the ED on 5/7 with recurrent palpitation and shortness of breath and lightheadedness. ?Patient was recently hospitalized 5/2 to 5/4 for A-fib with RVR for which she underwent cardioversion.  She did not tolerate Cardizem due to hypotension and hence loaded with oral amiodarone.  Patient felt fine for 2 days but since 5/6, patient started having episodes of palpitation again.  On 5/7, she woke up with strong feeling of palpitation with shortness of breath, lightheadedness. She also complained about loose bowel movement for last 3 days, including one episode on 5/7. ? ?In the ED, patient was afebrile, heart rate 100, hemodynamically stable ?Cardiology is consulted.  Patient was loaded on IV amiodarone. ?Admitted to hospitalist service ? ?Subjective: ?Patient was seen and examined this afternoon.  Pleasant elderly Caucasian female.  Lying in bed.  Not in distress.  Family available on the phone. ?Chart reviewed ?Heart rate in 90s this morning. ?Lab with sodium low at 133, potassium low at 2.9, creatinine stable, slightly elevated LFTs ? ?Principal Problem: ?  A-fib ([place]) ?Active Problems: ?  Atrial fibrillation with RVR ([place]) ?  (HFimpEF) heart failure with improved ejection fraction ([place]) ?  Grade 3a follicular lymphoma of lymph nodes of multiple regions [facility]) ?  HFimpEF (Heart Failure with improved EF) ?  ?Assessment and Plan: ?Recurrent A-fib with RVR ?-Presented with strong feeling of palpitation despite oral amiodarone load.   ?-IV amiodarone-started on admission.   ?-Cardiology consult appreciated.  Also  started on low-dose beta-blocker. ?-To be seen by EP today. ? ?Chronic diastolic CHF decompensation ?Pulmonary hypertension ?-Euvolemic, her increasing breathing effort likely from uncontrolled A-fib and baseline pulmonary hypertension.   ?-Echo from March 2022 with EF 50 to 55%.   ?-BP borderline low, monitor off diuresis for now. ? ?CKD 3B ?-Creatinine stable. ?Recent Labs  ?  01/21/22 ?2316 01/22/22 ?0040 01/23/22 ?0243 01/24/22 ?0344 02/02/22 ?1032 02/16/22 ?1441 02/17/22 ?0328 02/18/22 ?8182 02/21/22 ?1338 02/22/22 ?0128  ?BUN '20 21 18 21 22 16 14 13 15 14  '$ ?CREATININE 1.37* 1.32* 1.23* 1.17* 1.35* 1.34* 1.24* 1.19* 1.22* 1.23*  ? ?Hypokalemia/hypomagnesemia ?-Potassium and magnesium level low.  Ordered for replacement.  Repeat labs tomorrow ?Recent Labs  ?Lab 02/16/22 ?1441 02/16/22 ?2002 02/17/22 ?0328 02/17/22 ?1910 02/18/22 ?0238 02/21/22 ?1338 02/21/22 ?1657 02/22/22 ?0128  ?K 3.2*  --  4.0  --  4.2 5.1  --  2.9*  ?MG  --  1.6*  --  1.7 2.1  --  1.3* 2.8*  ?PHOS  --  2.7  --   --   --   --   --   --   ? ?Hypoalbuminemia ?Transaminitis ?-UA did not show proteinuria.  Right upper quadrant ultrasound without any evidence of liver cirrhosis. ?Recent Labs  ?Lab 02/16/22 ?1441 02/21/22 ?1338 02/22/22 ?0128  ?AST 48* 92* 72*  ?ALT 45* 56* 54*  ?ALKPHOS 73 104 131*  ?BILITOT 0.9 2.0* 1.0  ?PROT 5.7* 4.6* 5.5*  ?ALBUMIN 2.9* 2.2* 3.2*  ? ?Stage IIIa non-Hodgkin's lymphoma ?-Most recent oncology visit January 23, at that point patient has no symptoms or signs of relapse or respiratory disease. ?-Outpatient oncology  follow-up ?  ?Chronic leukocytopenia and pancytopenia ?-Secondary to lymphoma ? ?Goals of care ?  Code Status: DNR  ? ? ?Mobility: Encourage ambulation ? ?Skin assessment:  ?  ? ?Nutritional status:  ?Body mass index is 26.15 kg/m?.  ?  ?  ? ? ? ? ?Diet:  ?Diet Order   ? ?       ?  Diet Heart Room service appropriate? Yes; Fluid consistency: Thin  Diet effective now       ?  ? ?  ?  ? ?  ? ? ?DVT  prophylaxis:  ? ?apixaban (ELIQUIS) tablet 5 mg   ?Antimicrobials: None ?Fluid: None ?Consultants: Cardiology ?Family Communication: Daughter available on the phone ? ?Status is: Inpatient ? ?Continue in-hospital care because: May need EP intervention ?Level of care: Progressive  ? ?Dispo: The patient is from: Home ?             Anticipated d/c is to: Pending clinical course ?             Patient currently is not medically stable to d/c. ?  Difficult to place patient No ? ? ? ? ?Infusions:  ? sodium chloride    ? amiodarone 30 mg/hr (02/22/22 0746)  ? ? ?Scheduled Meds: ? apixaban  5 mg Oral BID  ? Chlorhexidine Gluconate Cloth  6 each Topical Daily  ? levothyroxine  88 mcg Oral QAC breakfast  ? metoprolol tartrate  12.5 mg Oral BID  ? potassium chloride  40 mEq Oral Q3H  ? sodium chloride flush  3 mL Intravenous Q12H  ? ? ?PRN meds: ?sodium chloride, acetaminophen, ondansetron (ZOFRAN) IV, sodium chloride flush, sodium chloride flush  ? ?Antimicrobials: ?Anti-infectives (From admission, onward)  ? ? None  ? ?  ? ? ?Objective: ?Vitals:  ? 02/22/22 0821 02/22/22 1149  ?BP: 130/76 129/89  ?Pulse: 97 96  ?Resp: 18 18  ?Temp: 98.9 ?F (37.2 ?C) 97.8 ?F (36.6 ?C)  ?SpO2:    ? ? ?Intake/Output Summary (Last 24 hours) at 02/22/2022 1535 ?Last data filed at 02/22/2022 1500 ?Gross per 24 hour  ?Intake 1131.32 ml  ?Output --  ?Net 1131.32 ml  ? ?Filed Weights  ? 02/21/22 1342 02/22/22 0402  ?Weight: 63.7 kg 62.8 kg  ? ?Weight change:  ?Body mass index is 26.15 kg/m?.  ? ?Physical Exam: ?General exam: Pleasant, elderly Caucasian female.  Not in physical distress ?Skin: No rashes, lesions or ulcers. ?HEENT: Atraumatic, normocephalic, no obvious bleeding ?Lungs: Clear to auscultation bilaterally ?CVS: Controlled rate, regular rhythm, no murmur ?GI/Abd soft, nontender, nondistended, bowels are present ?CNS: Alert, awake, oriented x3 ?Psychiatry: Sad affect ?Extremities: No pedal edema, no calf tenderness ? ?Data Review: I have  personally reviewed the laboratory data and studies available. ? ?F/u labs ordered ?Unresulted Labs (From admission, onward)  ? ?  Start     Ordered  ? 02/23/22 6834  Basic metabolic panel  Tomorrow morning,   R       ?Question:  Specimen collection method  Answer:  Lab=Lab collect  ? 02/22/22 1515  ? 02/22/22 1962  Basic metabolic panel  Daily,   R     ?Comments: As Scheduled for 5 days ?  ? 02/21/22 1456  ? ?  ?  ? ?  ? ? ?Signed, ?Terrilee Croak, MD ?[facility] ?02/22/2022 ? ? ? ? ? ? ? ? ? ? ? ? ?

## 2022-02-22 NOTE — Consult Note (Signed)
?Cardiology Consultation:  ? ?Patient ID: Carrie [name] ?MRN: [id number]; DOB: [birth year] ? ?Admit date: 02/21/2022 ?Date of Consult: 02/22/2022 ? ?PCP:  [name] ?  ?[facility] Providers ?Cardiologist:  [name], MD :1}   ? ? ?Patient Profile:  ? ?Carrie [name] is a 81 y.o. female with a hx of follicular lymphoma grade III, post chemo,  on rituximab, hx of NICM w/recvered LVEF with SR,  non-obstructive CAD (cath 12/2021), hypothyroidism, CKD (III)who is being seen 02/22/2022 for the evaluation of AFib at the request of Dr. Pietro Cassis. ? ? ?AFib Hx ?Diagnosed 2016 in setting of urosepsis ?Flecainide, presumably stopped 2/2 development of CM ?Amiodarone 2020 stopped with maintenance of SR restarted March 2023 ? ?There is mention of hypotension with dilt  ? ?History of Present Illness:  ? ?Ms. Siravo recently hospitalized 02/16/22 with AFib RVR, at that time she mentioned not feeling well even when in SR, but in AF feels worse, she was also found to have a UTI, started on amio gtt > DCCV 02/17/22 ?Transitioned back to PO amiodarone 238m BID for 7d then back to daily. ?Discharged 02/18/22 ?Planned for  Afib clinic follow up ? ?Back in the ER yesterday, admitted with diarrhea and recurrent AFib with RVR and again PO >> amio gtt ?Discussed that she has elevated LFTs, ? If more to do with diarrhea (3d), gastroenteritis perhaps then the amio ?Not felt to be volume OL ?Cardiology consulted, pt/daughter voiced frustration with AFib and EP is asked to help. ? ?LABS ?K+ 5.1 ?Mag 1.3 >> 2.8 (got 4gm) ?BUN/Creat 15/1.22 > 14/1.23 ?Albumin 2.2 > 3.2 ?AST 92 > 72 ?ALT 56 > 54 ?BNP 956 ?HS Trop 38 > 46 ?WBC 1.5 ?H/H 9.9/31 ?Plts 292 ? ?CXR clear ?Abd UKoreaneg ? ?The patient denies any overt cardiac awareness, no palpitations, SOB. ?Found her HR fast when checking her BP and felt she should come in. ?She reports about 3 days of loose, but not watery stools, unusual for her, not particularly frequent. ?No symptoms of  illness ? ? ? ?Past Medical History:  ?Diagnosis Date  ? A-fib ([place]   ? Anemia   ? pt denies   ? Atrial fibrillation with rapid ventricular response ([facility] 06/25/2015  ? Carpal tunnel syndrome, bilateral   ? pt denies   ? Cervical spondylosis without myelopathy 10/25/2013  ? Dental bridge present   ? DJD (degenerative joint disease)   ? Dyspnea   ? sometimes hx of cancer non hodgkins lymphoma   ? Dysrhythmia   ? WENT INTO A FIB IN 2017   ? Elevated troponin 02/18/2015  ? Follicular lymphoma grade 3a ([place] 02/03/2015  ? hx of nonhodgkins lymphoma x 3   ? Headache   ? sometimes a headache   ? History of echocardiogram   ? Echo 12/16: EF 60-65%, no RWMA, severe LAE  ? History of nuclear stress test   ? Myoview 1/17: EF 55%, Normal study. No ischemia or scar.  ? Hypertension   ? Hypothyroid   ? Nausea without vomiting 06/20/2015  ? Stage III chronic kidney disease ([facility] 07/01/2015  ? Thrush of mouth and esophagus ([facility] 06/25/2015  ? ? ?Past Surgical History:  ?Procedure Laterality Date  ? ABDOMINAL HYSTERECTOMY    ? APPENDECTOMY    ? BACK SURGERY    ? X5-lumbar-fusion  ? CARDIOVERSION N/A 02/17/2022  ? Procedure: CARDIOVERSION;  Surgeon: TBerniece Salines DO;  [facility]  Service: Cardiovascular;  Laterality: N/A;  ?  COLONOSCOPY    ? DILATION AND CURETTAGE OF UTERUS    ? LYMPH NODE BIOPSY Right 01/21/2015  ? Procedure: RIGHT GROIN LYMPH NODE BIOPSY;  Surgeon: Erroll Luna, MD;  Location: [facility];  Service: General;  Laterality: Right;  ? MASS EXCISION Left 08/29/2018  ? Procedure: EXCISION LEFT BACK  MASS;  Surgeon: Erroll Luna, MD;  Location: [facility];  Service: General;  Laterality: Left;  ? Ovarian cyst resection    ? patelar tendon transplants    ? Left/right  ? PORTACATH PLACEMENT Right 02/13/2015  ? Procedure: INSERTION PORT-A-CATH WITH ULTRASOUND;  Surgeon: Erroll Luna, MD;  Location: [facility];  Service: General;  Laterality: Right;  ? PORTACATH PLACEMENT N/A 08/29/2018  ? Procedure: INSERTION  PORT-A-CATH WITH ULTRA SOUND ERAS PATHWAY;  Surgeon: Erroll Luna, MD;  Location: [facility];  Service: General;  Laterality: N/A;  ? REVERSE SHOULDER ARTHROPLASTY Left 03/02/2021  ? Procedure: REVERSE SHOULDER ARTHROPLASTY;  Surgeon: Netta Cedars, MD;  Location[facility];  Service: Orthopedics;  Laterality: Left;  ? RIGHT/LEFT HEART CATH AND CORONARY ANGIOGRAPHY N/A 01/15/2022  ? Procedure: RIGHT/LEFT HEART CATH AND CORONARY ANGIOGRAPHY;  Surgeon: [name], MD;  Location: [facility];  Service: Cardiovascular;  Laterality: N/A;  ? TOTAL KNEE ARTHROPLASTY  2011  ? Right  ? TUBAL LIGATION    ?  ? ?Home Medications:  ?Prior to Admission medications   ?Medication Sig Start Date End Date Taking? Authorizing Provider  ?acetaminophen (TYLENOL) 500 MG tablet Take 500 mg by mouth every 6 (six) hours as needed for mild pain or headache.   Yes [provider]  ?amiodarone (PACERONE) 200 MG tablet Take 1 tab (200 mg total) twice daily for 1 week and then reduce to 1 tablet (200 mg total) once daily. ?Patient taking differently: Take 200 mg by mouth in the morning. 02/18/22  Yes Hongalgi, Lenis Dickinson, MD  ?amoxicillin (AMOXIL) 500 MG capsule Take 2,000 mg by mouth See admin instructions. Take 2,000 mg by mouth 30-60 minutes before dental appointment 06/30/21  Yes [provider]  ?apixaban (ELIQUIS) 5 MG TABS tablet Take 5 mg by mouth 2 (two) times daily.    Yes [provider]  ?levothyroxine (SYNTHROID) 88 MCG tablet Take 1 tablet (88 mcg total) by mouth daily before breakfast. 01/24/22  Yes Lavina Hamman, MD  ?lidocaine-prilocaine (EMLA) cream Apply 1 application topically daily as needed. ?Patient taking differently: Apply 1 application. topically daily as needed (for port access). 04/07/21  Yes [name], MD  ?polyvinyl alcohol (LIQUIFILM TEARS) 1.4 % ophthalmic solution Place 1 drop into both eyes 3 (three) times daily as needed for dry eyes.   Yes [provider]  ?furosemide  (LASIX) 20 MG tablet Take 1 tablet (20 mg total) by mouth daily for 3 days. ?Patient not taking: Reported on 02/21/2022 02/19/22 02/22/22  Modena Jansky, MD  ? ? ?Inpatient Medications: ?Scheduled Meds: ? apixaban  5 mg Oral BID  ? Chlorhexidine Gluconate Cloth  6 each Topical Daily  ? levothyroxine  88 mcg Oral QAC breakfast  ? metoprolol tartrate  12.5 mg Oral BID  ? sodium chloride flush  3 mL Intravenous Q12H  ? ?Continuous Infusions: ? sodium chloride    ? amiodarone 30 mg/hr (02/22/22 0746)  ? ?PRN Meds: ?sodium chloride, acetaminophen, ondansetron (ZOFRAN) IV, sodium chloride flush, sodium chloride flush ? ?Allergies:    ?Allergies  ?Allergen Reactions  ? Amiodarone Other (See Comments)  ?  "Hyperthyroidism"....BUT, the  patient is taking this (2023)  ? ? ?Social History:   ?Social History  ? ?Socioeconomic History  ? Marital status: Widowed  ?  Spouse name: Not on file  ? Number of children: 2  ? Years of education: hs  ? Highest education level: Not on file  ?Occupational History  ? Occupation: Retired  ?Tobacco Use  ? Smoking status: Never  ? Smokeless tobacco: Never  ?Vaping Use  ? Vaping Use: Never used  ?Substance and Sexual Activity  ? Alcohol use: Not Currently  ? Drug use: No  ? Sexual activity: Not on file  ?Other Topics Concern  ? Not on file  ?Social History Narrative  ? Lives alone.  Has a walker for home use.  ? ?Social Determinants of Health  ? ?Financial Resource Strain: Not on file  ?Food Insecurity: Not on file  ?Transportation Needs: Not on file  ?Physical Activity: Not on file  ?Stress: Not on file  ?Social Connections: Not on file  ?Intimate Partner Violence: Not on file  ?  ?Family History:   ?Family History  ?Problem Relation Age of Onset  ? Cancer Mother   ?     Breast, lung NHL  ? Cancer Sister   ?     Multiple myeloma  ?  ? ?ROS:  ?Please see the history of present illness.  ?All other ROS reviewed and negative.    ? ?Physical Exam/Data:  ? ?Vitals:  ? 02/21/22 2347 02/22/22 0402  02/22/22 0820 02/22/22 9179  ?BP: (!) 126/97 (!) 123/92  130/76  ?Pulse: 96 (!) 103  97  ?Resp:    18  ?Temp: 99.5 ?F (37.5 ?C) 98.6 ?F (37 ?C)  98.9 ?F (37.2 ?C)  ?TempSrc: Oral Oral  Oral  ?SpO2: 93% 94% 96%   ?Weight

## 2022-02-22 NOTE — Progress Notes (Signed)
?  Mobility Specialist Criteria Algorithm Info. ? ? 02/22/22 1100  ?Mobility  ?Activity Ambulated with assistance in room;Refused mobility ?(Declined hallway ambulation)  ?Range of Motion/Exercises Active;All extremities  ?Level of Assistance Standby assist, set-up cues, supervision of patient - no hands on  ?Assistive Device None  ?Distance Ambulated (ft) 10 ft  ?Activity Response Tolerated well  ?Patient received in bed agreeable to participate in mobility. Ambulated in short distance in room with supervision. Declined further ambulation or participation with mobility stating she just doesn't want to do it. Was left in supine with all needs met, call bell in reach.  ? ? ?02/22/2022 ?12:38 PM ? ?Martin[place] Ziv Welchel, CMS, BS EXP ?Acute Rehabilitation Services  ?FWBLT:[phone] ?Office: [phone] ? ?

## 2022-02-23 ENCOUNTER — Inpatient Hospital Stay (HOSPITAL_COMMUNITY): Payer: No Typology Code available for payment source

## 2022-02-23 DIAGNOSIS — I482 Chronic atrial fibrillation, unspecified: Secondary | ICD-10-CM | POA: Diagnosis not present

## 2022-02-23 LAB — BASIC METABOLIC PANEL
Anion gap: 12 (ref 5–15)
BUN: 15 mg/dL (ref 8–23)
CO2: 24 mmol/L (ref 22–32)
Calcium: 8.3 mg/dL — ABNORMAL LOW (ref 8.9–10.3)
Chloride: 95 mmol/L — ABNORMAL LOW (ref 98–111)
Creatinine, Ser: 1.27 mg/dL — ABNORMAL HIGH (ref 0.44–1.00)
GFR, Estimated: 43 mL/min — ABNORMAL LOW (ref >60)
Glucose, Bld: 157 mg/dL — ABNORMAL HIGH (ref 70–99)
Potassium: 3.9 mmol/L (ref 3.5–5.1)
Sodium: 131 mmol/L — ABNORMAL LOW (ref 135–145)

## 2022-02-23 MED ORDER — METOPROLOL TARTRATE 5 MG/5ML IV SOLN
2.5000 mg | Freq: Once | INTRAVENOUS | Status: AC
  Administered 2022-02-23: 2.5 mg via INTRAVENOUS
  Filled 2022-02-23: qty 5

## 2022-02-23 MED ORDER — METOPROLOL TARTRATE 25 MG PO TABS
25.0000 mg | ORAL_TABLET | Freq: Once | ORAL | Status: AC
  Administered 2022-02-23: 25 mg via ORAL
  Filled 2022-02-23: qty 1

## 2022-02-23 MED ORDER — METOPROLOL TARTRATE 50 MG PO TABS
50.0000 mg | ORAL_TABLET | Freq: Two times a day (BID) | ORAL | Status: DC
  Administered 2022-02-23: 50 mg via ORAL
  Filled 2022-02-23: qty 1

## 2022-02-23 MED ORDER — AMIODARONE HCL 200 MG PO TABS
200.0000 mg | ORAL_TABLET | Freq: Two times a day (BID) | ORAL | Status: DC
  Administered 2022-02-23 – 2022-03-03 (×17): 200 mg via ORAL
  Filled 2022-02-23 (×17): qty 1

## 2022-02-23 MED ORDER — FUROSEMIDE 10 MG/ML IJ SOLN
40.0000 mg | Freq: Once | INTRAMUSCULAR | Status: AC
  Administered 2022-02-23: 40 mg via INTRAVENOUS
  Filled 2022-02-23: qty 4

## 2022-02-23 NOTE — Progress Notes (Signed)
Heart Failure Navigator Progress Note ? ?Assessed for Heart & Vascular TOC clinic readiness.  ?Patient does not meet criteria due to no benefit at this time..  ? ? ? ?Savi Lastinger, BSN, RN ?Heart Failure Nurse Navigator ?Secure Chat Only   ?

## 2022-02-23 NOTE — Progress Notes (Addendum)
HOSPITAL MEDICINE OVERNIGHT EVENT NOTE   ? ?Notified by nursing that patient is exhibiting rapid atrial fibrillation with heart rates in the 130s.  Patient is currently hemodynamically stable. ? ?Patient denies any symptoms such as palpitations or chest pain at this time. ? ?Chart reviewed, patient is currently receiving amiodarone and low-dose metoprolol.  We will give a dose of 2.5 mg of intravenous metoprolol now and assess for response. ? ?Carrie Emerald  MD ?[facility]  ? ?OVERNIGHT EVENT (5/9 6AM) ? ?Patient developed increasing oxygen requirement the longer she was in rapid atrial fibrillation. ? ?Stat chest x-ray obtained revealing evidence of worsening vascular congestion and edema.  As result I have ordered 40 mg of intravenous Lasix. ? ?As nursing was about to administer the Lasix, they identified that the patient's Port-A-Cath was not appropriately accessed.  This resulted in the patient's amiodarone leaking for an unknown amount of time.   This is likely the culprit for the patient's development of rapid atrial fibrillation. ? ?IV team was contacted with Port-A-Cath reaccessed and amiodarone infusion resuming properly.  Heart rate is already coming down and approaching 100 bpm. ? ?We will continue to monitor closely. ? ?Carrie [name] ? ? ? ? ? ? ? ? ? ? ? ? ?

## 2022-02-23 NOTE — Progress Notes (Signed)
? ?Progress Note ? ?Patient Name: Carrie [name] ?Date of Encounter: 02/23/2022 ? ?[facility] Cardiologist: [name], MD  ? ?Subjective  ? ?Tired, woken a few times, otherwise ok ? ?Inpatient Medications  ?  ?Scheduled Meds: ? apixaban  5 mg Oral BID  ? Chlorhexidine Gluconate Cloth  6 each Topical Daily  ? levothyroxine  88 mcg Oral QAC breakfast  ? metoprolol tartrate  25 mg Oral BID  ? sodium chloride flush  3 mL Intravenous Q12H  ? ?Continuous Infusions: ? sodium chloride    ? amiodarone 30 mg/hr (02/23/22 0456)  ? ?PRN Meds: ?sodium chloride, acetaminophen, ondansetron (ZOFRAN) IV, sodium chloride flush, sodium chloride flush  ? ?Vital Signs  ?  ?Vitals:  ? 02/23/22 0150 02/23/22 0333 02/23/22 0440 02/23/22 9323  ?BP: 127/89  (!) 133/101 128/89  ?Pulse: (!) 129  72 (!) 122  ?Resp: (!) '22  16 18  '$ ?Temp: 98.3 ?F (36.8 ?C)  98.1 ?F (36.7 ?C) 97.6 ?F (36.4 ?C)  ?TempSrc: Oral  Oral Oral  ?SpO2: 97%  94% 95%  ?Weight:  60.8 kg 61.7 kg   ?Height:      ? ? ?Intake/Output Summary (Last 24 hours) at 02/23/2022 0902 ?Last data filed at 02/23/2022 0813 ?Gross per 24 hour  ?Intake 543.63 ml  ?Output 1050 ml  ?Net -506.37 ml  ? ? ?  02/23/2022  ?  4:40 AM 02/23/2022  ?  3:33 AM 02/22/2022  ?  4:02 AM  ?Last 3 Weights  ?Weight (lbs) 136 lb 0.4 oz 134 lb 0.6 oz 138 lb 6.4 oz  ?Weight (kg) 61.7 kg 60.8 kg 62.778 kg  ?   ? ?Telemetry  ?  ?AFib 90's-130's - Personally Reviewed ? ?ECG  ?  ?No new EKGs - Personally Reviewed ? ?Physical Exam  ? ?GEN: No acute distress.   ?Neck: No JVD ?Cardiac: irreg-irreg, no murmurs, rubs, or gallops.  ?Respiratory: Clear to auscultation bilaterally. ?GI: Soft, nontender, non-distended  ?MS: No edema; No deformity. ?Neuro:  Nonfocal  ?Psych: Normal affect  ? ?Labs  ?  ?High Sensitivity Troponin:   ?Recent Labs  ?Lab 02/16/22 ?1441 02/16/22 ?1659 02/21/22 ?1338 02/21/22 ?1657  ?TROPONINIHS 49* 47* 38* 46*  ?   ?Chemistry ?Recent Labs  ?Lab 02/16/22 ?1441 02/16/22 ?2002 02/18/22 ?0238  02/21/22 ?1338 02/21/22 ?1657 02/22/22 ?0128 02/23/22 ?0202  ?NA 132*   < > 130* 131*  --  133* 131*  ?K 3.2*   < > 4.2 5.1  --  2.9* 3.9  ?CL 97*   < > 99 97*  --  95* 95*  ?CO2 25   < > 21* 24  --  27 24  ?GLUCOSE 113*   < > 164* 216*  --  140* 157*  ?BUN 16   < > 13 15  --  14 15  ?CREATININE 1.34*   < > 1.19* 1.22*  --  1.23* 1.27*  ?CALCIUM 8.2*   < > 7.8* 7.4*  --  8.0* 8.3*  ?MG  --    < > 2.1  --  1.3* 2.8*  --   ?PROT 5.7*  --   --  4.6*  --  5.5*  --   ?ALBUMIN 2.9*  --   --  2.2*  --  3.2*  --   ?AST 48*  --   --  92*  --  72*  --   ?ALT 45*  --   --  56*  --  54*  --   ?  ALKPHOS 73  --   --  104  --  131*  --   ?BILITOT 0.9  --   --  2.0*  --  1.0  --   ?GFRNONAA 40*   < > 47* 45*  --  45* 43*  ?ANIONGAP 10   < > 10 10  --  11 12  ? < > = values in this interval not displayed.  ?  ?Lipids No results for input(s): CHOL, TRIG, HDL, LABVLDL, LDLCALC, CHOLHDL in the last 168 hours.  ?Hematology ?Recent Labs  ?Lab 02/17/22 ?0328 02/18/22 ?0238 02/21/22 ?1338  ?WBC 1.7* 1.4* 1.5*  ?RBC 3.19* 2.97* 3.44*  ?HGB 9.5* 8.9* 9.9*  ?HCT 29.2* 26.8* 31.0*  ?MCV 91.5 90.2 90.1  ?MCH 29.8 30.0 28.8  ?MCHC 32.5 33.2 31.9  ?RDW 13.8 14.1 14.5  ?PLT 234 245 292  ? ?Thyroid  ?Recent Labs  ?Lab 02/16/22 ?2002  ?TSH 2.529  ?FREET4 1.39*  ?  ?BNP ?Recent Labs  ?Lab 02/16/22 ?1441 02/18/22 ?0238 02/21/22 ?1338  ?BNP 1,028.2* 863.9* 956.5*  ?  ?DDimer No results for input(s): DDIMER in the last 168 hours.  ? ?Radiology  ?  ?DG Chest 1 View ? ?Result Date: 02/23/2022 ?CLINICAL DATA:  Shortness of breath and chest pain. EXAM: CHEST  1 VIEW COMPARISON:  Chest radiograph dated 02/21/2022. FINDINGS: Right-sided Port-A-Cath in similar position. There is cardiomegaly with vascular congestion and mild edema. Pneumonia is not excluded. No focal consolidation, pleural effusion or pneumothorax. No acute osseous pathology. Left shoulder arthroplasty. IMPRESSION: Cardiomegaly with vascular congestion and mild edema. Pneumonia is not excluded.  Electronically Signed   By: Anner Crete M.D.   On: 02/23/2022 02:20  ? ?DG Chest Port 1 View ? ?Result Date: 02/21/2022 ?CLINICAL DATA:  Flutter in chest.  Shortness of breath. EXAM: PORTABLE CHEST 1 VIEW COMPARISON:  Feb 18, 2022 FINDINGS: Stable thickening the right fissure, possibly due to pleural thickening or a small amount of fluid. Stable cardiomegaly. The hila and mediastinum are unremarkable. Stable right Port-A-Cath. No pneumothorax. No overt edema. No other acute abnormalities. IMPRESSION: No significant interval change. Stable cardiomegaly. No overt edema. Electronically Signed   By: Dorise Bullion III M.D.   On: 02/21/2022 14:02  ? ?US Abdomen Limited RUQ (LIVER/GB) ? ?Result Date: 02/21/2022 ?CLINICAL DATA:  Elevated liver function tests. EXAM: ULTRASOUND ABDOMEN LIMITED RIGHT UPPER QUADRANT COMPARISON:  None Available. FINDINGS: Gallbladder: No gallstones or wall thickening visualized. No sonographic Murphy sign noted by sonographer. Common bile duct: Diameter: 3 mm, within normal limits. Liver: No focal lesion identified. Within normal limits in parenchymal echogenicity. Portal vein is patent on color Doppler imaging with normal direction of blood flow towards the liver. Other: None. IMPRESSION: Negative. No hepatobiliary abnormality identified. Electronically Signed   By: Marlaine Hind M.D.   On: 02/21/2022 16:15   ? ?Cardiac Studies  ? ?R/L Cardiac catheterization 01/15/2022 ?RCA proximal 15 ?PCWP 11, mean PA 21, Fick CO 6.09 ?EF 50-55 ?  ?Echocardiogram 12/31/21 ?Inf HK, EF 50-55, normal PASP 38, normal RVSF, mild LAE, mild MR, mild to mod TR, mild AI, AV sclerosis w/o AS ?  ?Echocardiogram 10/31/2020 ?EF 55-60, GR 1 DD, GLS -17.9% (normal), normal RVSF, moderate LAE, mild-moderate MR, mild AI ?  ?Echocardiogram 05/24/2019 ?EF 55-60, GRII DD, normal RVSF, mild LAE, mild MR, trivial AI, PASP 28 ?  ?Echocardiogram 02/25/2019 ?EF 20-25, severely reduced RVSF, RVSP 23.5, mild BAE, trivial pericardial  effusion, moderate MR, trivial AI ?  ?Echocardiogram  10/15/2015 ?EF 60-65, normal wall motion, severe LAE ?  ?Echo 06/26/15 ?Inferobasal AK, EF 55%, mild AI, mild MR, mild LAE ?  ?Echo 4/16 ?EF 55-60%, normal wall motion, grade 2 diastolic dysfunction, trivial AI, mild MR, moderate LAE, mild TR, trivial PI, PASP 33 mmHg ?  ?Myoview 11/10 ?Normal, EF 66% ?  ?Carotid US 6/05 ?No ICA stenosis ? ?Patient Profile  ?   ?81 y.o. female with a hx of follicular lymphoma grade III, post chemo,  on rituximab, hx of NICM w/recvered LVEF with SR,  non-obstructive CAD (cath 12/2021), hypothyroidism, CKD (III) and AFib ? ? ?AFib Hx ?Diagnosed 2016 in setting of urosepsis ?Flecainide, presumably stopped 2/2 development of CM ?Amiodarone 2020 stopped with maintenance of SR restarted March 2023 ?There is mention of hypotension with dilt  ? ? ?Admitted with reports of fast HRs, found in AFib w/RVR ?PO amio >> gtt and lopressor started ? ?Assessment & Plan  ?  ?Paroxysmal Afib ?CHA2DS2Vasc is 4, on Eliquis with no missed doses ?S/p DCCV 02/18/22 with ERAF ? ?Asymptomatic with her AFib ?Planned for rate control strategy with amio/and titrate BB t BP tolerance ? ?Follow LFTs closely out patient ?Check tomorrow since last were trending down ? ?BP has room for more lopressor, titrate today further, will d/w Dr. Quentin Ore timing of transition to PO amio ?He will see her later today ? ?For questions or updates, please contact [place] ?Please consult [email or web address] for contact info under  ? ?  ?   ?Signed, ?Baldwin Jamaica, PA-C  ?02/23/2022, 9:02 AM   ? ?

## 2022-02-23 NOTE — Evaluation (Signed)
Physical Therapy Evaluation ?Patient Details ?Name: Carrie [name] ?MRN: [id number] ?DOB: [birth year] ?Today's Date: 02/23/2022 ? ?History of Present Illness ? Pt is a 81 y/o female admitted secondary to SOB and lightheadedness. Found to be in a fib. Pt with recent admission for same. PMH includes non-hodgkins lymphoma, a fib s/p cardioversion, CHF, and CKD.  ?Clinical Impression ? Pt admitted secondary to problem above with deficits below. Pt with increased fatigue which limited mobility tolerance this session. Requiring min guard to supervision for mobility tasks. Recommending HHPT at d/c to address mobility deficits. Will continue to follow acutely.    ?   ? ?Recommendations for follow up therapy are one component of a multi-disciplinary discharge planning process, led by the attending physician.  Recommendations may be updated based on patient status, additional functional criteria and insurance authorization. ? ?Follow Up Recommendations Home health PT ? ?  ?Assistance Recommended at Discharge Intermittent Supervision/Assistance  ?Patient can return home with the following ? A little help with bathing/dressing/bathroom;Assistance with cooking/housework;A little help with walking and/or transfers;Assist for transportation ? ?  ?Equipment Recommendations None recommended by PT  ?Recommendations for Other Services ?    ?  ?Functional Status Assessment Patient has had a recent decline in their functional status and demonstrates the ability to make significant improvements in function in a reasonable and predictable amount of time.  ? ?  ?Precautions / Restrictions Precautions ?Precautions: Fall ?Restrictions ?Weight Bearing Restrictions: No  ? ?  ? ?Mobility ? Bed Mobility ?Overal bed mobility: Needs Assistance ?Bed Mobility: Supine to Sit, Sit to Supine ?  ?  ?Supine to sit: Supervision ?Sit to supine: Supervision ?  ?General bed mobility comments: Supervision for safety and line management ?  ? ?Transfers ?Overall  transfer level: Needs assistance ?Equipment used: None ?Transfers: Sit to/from Stand ?Sit to Stand: Min guard ?  ?  ?  ?  ?  ?General transfer comment: Min guard for safety. Increased time to perform transfer. Pt with limited standing tolerance for clean up following BM. ?  ? ?Ambulation/Gait ?Ambulation/Gait assistance: Min guard, Supervision ?Gait Distance (Feet): 20 Feet ?Assistive device: None ?Gait Pattern/deviations: Step-through pattern, Decreased stride length ?Gait velocity: Decreased ?  ?  ?General Gait Details: Slow, cautious gait. Min guard to supervsion for safety throughout. Pt refusing mobility out of the room secondary to fatigue ? ?Stairs ?  ?  ?  ?  ?  ? ?Wheelchair Mobility ?  ? ?Modified Rankin (Stroke Patients Only) ?  ? ?  ? ?Balance Overall balance assessment: Needs assistance ?Sitting-balance support: Feet unsupported ?Sitting balance-Leahy Scale: Good ?  ?  ?Standing balance support: During functional activity ?Standing balance-Leahy Scale: Fair ?  ?  ?  ?  ?  ?  ?  ?  ?  ?  ?  ?  ?   ? ? ? ?Pertinent Vitals/Pain Pain Assessment ?Pain Assessment: No/denies pain  ? ? ?Home Living Family/patient expects to be discharged to:: Private residence ?Living Arrangements: Alone ?Available Help at Discharge: Family ?Type of Home: House ?Home Access: Ramped entrance ?  ?  ?  ?Home Layout: One level ?Home Equipment: Grab bars - toilet;Electric scooter;Wheelchair - manual;BSC/3in1;Shower seat - built in;Cane - single Barista (2 wheels) ?   ?  ?Prior Function Prior Level of Function : Independent/Modified Independent ?  ?  ?  ?  ?  ?  ?  ?  ?  ? ? ?Hand Dominance  ?   ? ?  ?Extremity/Trunk Assessment  ?  Upper Extremity Assessment ?Upper Extremity Assessment: Defer to OT evaluation ?  ? ?Lower Extremity Assessment ?Lower Extremity Assessment: Generalized weakness ?  ? ?Cervical / Trunk Assessment ?Cervical / Trunk Assessment: Normal  ?Communication  ? Communication: No difficulties  ?Cognition  Arousal/Alertness: Awake/alert ?Behavior During Therapy: [facility] for tasks assessed/performed ?Overall Cognitive Status: No family/caregiver present to determine baseline cognitive functioning ?  ?  ?  ?  ?  ?  ?  ?  ?  ?  ?  ?  ?  ?  ?  ?  ?General Comments: slow processing; somewhat self limiting ?  ?  ? ?  ?General Comments   ? ?  ?Exercises    ? ?Assessment/Plan  ?  ?PT Assessment Patient needs continued PT services  ?PT Problem List Decreased strength;Decreased activity tolerance;Decreased balance;Decreased mobility;Decreased knowledge of precautions;Decreased safety awareness;Cardiopulmonary status limiting activity ? ?   ?  ?PT Treatment Interventions Gait training;Functional mobility training;Therapeutic activities;Patient/family education;Balance training;Therapeutic exercise;DME instruction   ? ?PT Goals (Current goals can be found in the Care Plan section)  ?Acute Rehab PT Goals ?Patient Stated Goal: to go home ?PT Goal Formulation: With patient ?Time For Goal Achievement: 03/09/22 ?Potential to Achieve Goals: Good ? ?  ?Frequency Min 3X/week ?  ? ? ?Co-evaluation   ?  ?  ?  ?  ? ? ?  ?AM-PAC PT "6 Clicks" Mobility  ?Outcome Measure Help needed turning from your back to your side while in a flat bed without using bedrails?: None ?Help needed moving from lying on your back to sitting on the side of a flat bed without using bedrails?: None ?Help needed moving to and from a bed to a chair (including a wheelchair)?: A Little ?Help needed standing up from a chair using your arms (e.g., wheelchair or bedside chair)?: A Little ?Help needed to walk in hospital room?: A Little ?Help needed climbing 3-5 steps with a railing? : A Little ?6 Click Score: 20 ? ?  ?End of Session Equipment Utilized During Treatment: Gait belt ?Activity Tolerance: Patient limited by fatigue ?Patient left: in bed;with call bell/phone within reach ?Nurse Communication: Mobility status ?PT Visit Diagnosis: Other abnormalities of gait and  mobility (R26.89);Difficulty in walking, not elsewhere classified (R26.2) ?  ? ?Time: 2202-5427 ?PT Time Calculation (min) (ACUTE ONLY): 24 min ? ? ?Charges:   PT Evaluation ?$PT Eval Moderate Complexity: 1 Mod ?PT Treatments ?$Therapeutic Activity: 8-22 mins ?  ?   ? ? ?Reuel Derby, PT, DPT  ?Acute Rehabilitation Services  ?Pager: [phone] ?Office: [phone] ? ? ?[place] ?02/23/2022, 3:51 PM ? ?

## 2022-02-23 NOTE — Progress Notes (Signed)
?   02/23/22 0150  ?Assess: MEWS Score  ?Temp 98.3 ?F (36.8 ?C)  ?BP 127/89  ?Pulse Rate (!) 129  ?ECG Heart Rate (!) 132  ?Resp (!) 22  ?Level of Consciousness Alert  ?SpO2 97 %  ?O2 Device Nasal Cannula  ?Patient Activity (if Appropriate) In bed  ?O2 Flow Rate (L/min) 2 L/min  ?Assess: MEWS Score  ?MEWS Temp 0  ?MEWS Systolic 0  ?MEWS Pulse 3  ?MEWS RR 1  ?MEWS LOC 0  ?MEWS Score 4  ?MEWS Score Color Red  ?Assess: if the MEWS score is Yellow or Red  ?Were vital signs taken at a resting state? Yes  ?Focused Assessment Change from prior assessment (see assessment flowsheet)  ?Early Detection of Sepsis Score *See Row Information* High  ?MEWS guidelines implemented *See Row Information* Yes  ?Treat  ?MEWS Interventions Administered prn meds/treatments  ?Pain Scale 0-10  ?Pain Score 0  ?Patients Stated Pain Goal 0  ?Take Vital Signs  ?Increase Vital Sign Frequency  Red: Q 1hr X 4 then Q 4hr X 4, if remains red, continue Q 4hrs  ?Escalate  ?MEWS: Escalate Red: discuss with charge nurse/RN and provider, consider discussing with RRT  ?Notify: Charge Nurse/RN  ?Name of Charge Nurse/RN Notified Margreta Journey RN  ?Date Charge Nurse/RN Notified 02/23/22  ?Time Charge Nurse/RN Notified 0150  ?Notify: Provider  ?Provider Name/Title Dr. Cyd Silence  ?Date Provider Notified 02/23/22  ?Time Provider Notified [phone]  ?Method of Notification Page  ?Notification Reason Change in status  ?Provider response See new orders  ?Date of Provider Response 02/23/22  ?Time of Provider Response 1200  ?Document  ?Patient Outcome Other (Comment) ?(Pt HR is still elevated but pt is still asymptomatic)  ?Progress note created (see row info) Yes  ? ? ?

## 2022-02-23 NOTE — Progress Notes (Signed)
?PROGRESS NOTE ? ?Carrie [name]  ?DOB: July 13, 1941  ?PCP: [name] ?IWL:[id number]  ?DOA: 02/21/2022 ? LOS: 2 days  ?Hospital Day: 3 ? ?Brief narrative: ?Carrie [name] is a 81 y.o. female with PMH significant for HTN, HLD, CKD 3, pA-fib s/p recent cardioversion, chronic diastolic CHF, non-Hodgkin's lymphoma stage IIIa temporally of chemotherapy, chronic anemia, hypothyroidism ?Patient presented to the ED on 5/7 with recurrent palpitation and shortness of breath and lightheadedness. ?Patient was recently hospitalized 5/2 to 5/4 for A-fib with RVR for which she underwent cardioversion.  She did not tolerate Cardizem due to hypotension and hence loaded with oral amiodarone.  Patient felt fine for 2 days but since 5/6, patient started having episodes of palpitation again.  On 5/7, she woke up with strong feeling of palpitation with shortness of breath, lightheadedness. She also complained about loose bowel movement for last 3 days, including one episode on 5/7. ? ?In the ED, patient was afebrile, heart rate 100, hemodynamically stable ?Cardiology is consulted.  Patient was loaded on IV amiodarone. ?Admitted to hospitalist service ? ?Subjective: ?Patient was seen and examined this afternoon.  Pleasant elderly Caucasian female.  Lying in bed.  Not in distress.  Remains on amiodarone drip.  Medicines being adjusted by cardiology. ?Pending PT eval ? ?Principal Problem: ?  A-fib ([place]) ?Active Problems: ?  Atrial fibrillation with RVR ([place]) ?  (HFimpEF) heart failure with improved ejection fraction ([place]) ?  Grade 3a follicular lymphoma of lymph nodes of multiple regions [facility]) ?  HFimpEF (Heart Failure with improved EF) ?  ?Assessment and Plan: ?Recurrent A-fib with RVR ?-Presented with strong feeling of palpitation despite oral amiodarone load.   ?-Currently on IV amiodarone drip and beta-blocker.  Seen by EP. ? ?Chronic diastolic CHF decompensation ?Pulmonary hypertension ?-Euvolemic, her increasing breathing  effort likely from uncontrolled A-fib and baseline pulmonary hypertension.   ?-Echo from March 2022 with EF 50 to 55%.   ?-BP borderline low, monitor off diuresis for now. ? ?CKD 3B ?-Creatinine stable. ?Recent Labs  ?  01/22/22 ?0040 01/23/22 ?0243 01/24/22 ?0344 02/02/22 ?1032 02/16/22 ?1441 02/17/22 ?1740 02/18/22 ?8144 02/21/22 ?1338 02/22/22 ?0128 02/23/22 ?0202  ?BUN '21 18 21 22 16 14 13 15 14 15  '$ ?CREATININE 1.32* 1.23* 1.17* 1.35* 1.34* 1.24* 1.19* 1.22* 1.23* 1.27*  ? ?Hypokalemia/hypomagnesemia ?-Potassium and magnesium levels improved with replacement. ?Recent Labs  ?Lab 02/16/22 ?2002 02/17/22 ?0328 02/17/22 ?1910 02/18/22 ?0238 02/21/22 ?1338 02/21/22 ?1657 02/22/22 ?0128 02/23/22 ?0202  ?K  --  4.0  --  4.2 5.1  --  2.9* 3.9  ?MG 1.6*  --  1.7 2.1  --  1.3* 2.8*  --   ?PHOS 2.7  --   --   --   --   --   --   --   ? ?Transaminitis ?-UA did not show proteinuria.  Right upper quadrant ultrasound without any evidence of liver cirrhosis. ?-Continue to monitor liver enzymes. ?Recent Labs  ?Lab 02/21/22 ?1338 02/22/22 ?0128  ?AST 92* 72*  ?ALT 56* 54*  ?ALKPHOS 104 131*  ?BILITOT 2.0* 1.0  ?PROT 4.6* 5.5*  ?ALBUMIN 2.2* 3.2*  ? ?Stage IIIa non-Hodgkin's lymphoma ?-Most recent oncology visit January 23, at that point patient has no symptoms or signs of relapse or respiratory disease. ?-Outpatient oncology follow-up ?  ?Chronic leukocytopenia and pancytopenia ?-Secondary to lymphoma ? ?Goals of care ?  Code Status: DNR  ? ? ?Mobility: Encourage ambulation ? ?Skin assessment:  ?  ? ?Nutritional status:  ?  Body mass index is 25.7 kg/m?.  ?  ?  ? ? ? ? ?Diet:  ?Diet Order   ? ?       ?  Diet Heart Room service appropriate? Yes; Fluid consistency: Thin  Diet effective now       ?  ? ?  ?  ? ?  ? ? ?DVT prophylaxis:  ? ?apixaban (ELIQUIS) tablet 5 mg   ?Antimicrobials: None ?Fluid: None ?Consultants: Cardiology ?Family Communication: Family not at bedside ? ?Status is: Inpatient ? ?Continue in-hospital care because:  On IV amiodarone. ?Level of care: Progressive  ? ?Dispo: The patient is from: Home ?             Anticipated d/c is to: Pending clinical course.  Pending PT eval ?             Patient currently is not medically stable to d/c. ?  Difficult to place patient No ? ? ? ? ?Infusions:  ? sodium chloride    ? ? ?Scheduled Meds: ? amiodarone  200 mg Oral BID  ? apixaban  5 mg Oral BID  ? Chlorhexidine Gluconate Cloth  6 each Topical Daily  ? levothyroxine  88 mcg Oral QAC breakfast  ? metoprolol tartrate  50 mg Oral BID  ? sodium chloride flush  3 mL Intravenous Q12H  ? ? ?PRN meds: ?sodium chloride, acetaminophen, ondansetron (ZOFRAN) IV, sodium chloride flush, sodium chloride flush  ? ?Antimicrobials: ?Anti-infectives (From admission, onward)  ? ? None  ? ?  ? ? ?Objective: ?Vitals:  ? 02/23/22 0812 02/23/22 1118  ?BP: 128/89 (!) 117/91  ?Pulse: (!) 122 94  ?Resp: 18 20  ?Temp: 97.6 ?F (36.4 ?C) 97.6 ?F (36.4 ?C)  ?SpO2: 95% 95%  ? ? ?Intake/Output Summary (Last 24 hours) at 02/23/2022 1458 ?Last data filed at 02/23/2022 1120 ?Gross per 24 hour  ?Intake 543.63 ml  ?Output 1950 ml  ?Net -1406.37 ml  ? ?Filed Weights  ? 02/22/22 0402 02/23/22 0333 02/23/22 0440  ?Weight: 62.8 kg 60.8 kg 61.7 kg  ? ?Weight change: -2.93 kg ?Body mass index is 25.7 kg/m?.  ? ?Physical Exam: ?General exam: Pleasant, elderly Caucasian female.  Not in physical distress ?Skin: No rashes, lesions or ulcers. ?HEENT: Atraumatic, normocephalic, no obvious bleeding ?Lungs: Clear to auscultation bilaterally ?CVS: Controlled rate, regular rhythm, no murmur ?GI/Abd soft, nontender, nondistended, bowels are present ?CNS: Alert, awake, oriented x3 ?Psychiatry: Sad affect ?Extremities: No pedal edema, no calf tenderness ? ?Data Review: I have personally reviewed the laboratory data and studies available. ? ?F/u labs ordered ?Unresulted Labs (From admission, onward)  ? ?  Start     Ordered  ? 02/24/22 0500  Comprehensive metabolic panel  Tomorrow morning,   R        ?Question:  Specimen collection method  Answer:  Unit=Unit collect  ? 02/23/22 0912  ? ?  ?  ? ?  ? ? ?Signed, ?Terrilee Croak, MD ?[facility] ?02/23/2022 ? ? ? ? ? ? ? ? ? ? ? ? ?

## 2022-02-23 NOTE — Progress Notes (Signed)
Patient's heart rate is at 130 bpm and sustaining. Patient is currently asymptomatic. Notified on call MD.  ?Dr. Cyd Silence ordered 2.5 mg IV metroprolol. This nurse will continue to monitor the patient.  ?

## 2022-02-24 ENCOUNTER — Inpatient Hospital Stay (HOSPITAL_COMMUNITY): Payer: No Typology Code available for payment source

## 2022-02-24 DIAGNOSIS — I4891 Unspecified atrial fibrillation: Secondary | ICD-10-CM

## 2022-02-24 DIAGNOSIS — I482 Chronic atrial fibrillation, unspecified: Secondary | ICD-10-CM | POA: Diagnosis not present

## 2022-02-24 DIAGNOSIS — I5020 Unspecified systolic (congestive) heart failure: Secondary | ICD-10-CM

## 2022-02-24 DIAGNOSIS — C8238 Follicular lymphoma grade IIIa, lymph nodes of multiple sites: Secondary | ICD-10-CM | POA: Diagnosis not present

## 2022-02-24 DIAGNOSIS — I5033 Acute on chronic diastolic (congestive) heart failure: Secondary | ICD-10-CM | POA: Diagnosis not present

## 2022-02-24 LAB — CBC WITH DIFFERENTIAL/PLATELET
Abs Immature Granulocytes: 0 10*3/uL (ref 0.00–0.07)
Basophils Absolute: 0 10*3/uL (ref 0.0–0.1)
Basophils Relative: 1 %
Eosinophils Absolute: 0 10*3/uL (ref 0.0–0.5)
Eosinophils Relative: 3 %
HCT: 26.9 % — ABNORMAL LOW (ref 36.0–46.0)
Hemoglobin: 8.7 g/dL — ABNORMAL LOW (ref 12.0–15.0)
Lymphocytes Relative: 13 %
Lymphs Abs: 0.2 10*3/uL — ABNORMAL LOW (ref 0.7–4.0)
MCH: 28.7 pg (ref 26.0–34.0)
MCHC: 32.3 g/dL (ref 30.0–36.0)
MCV: 88.8 fL (ref 80.0–100.0)
Monocytes Absolute: 0.2 10*3/uL (ref 0.1–1.0)
Monocytes Relative: 12 %
Neutro Abs: 1.1 10*3/uL — ABNORMAL LOW (ref 1.7–7.7)
Neutrophils Relative %: 71 %
Platelets: 258 10*3/uL (ref 150–400)
RBC: 3.03 MIL/uL — ABNORMAL LOW (ref 3.87–5.11)
RDW: 14.7 % (ref 11.5–15.5)
WBC: 1.6 10*3/uL — ABNORMAL LOW (ref 4.0–10.5)
nRBC: 0 % (ref 0.0–0.2)

## 2022-02-24 LAB — COMPREHENSIVE METABOLIC PANEL
ALT: 56 U/L — ABNORMAL HIGH (ref 0–44)
AST: 59 U/L — ABNORMAL HIGH (ref 15–41)
Albumin: 3 g/dL — ABNORMAL LOW (ref 3.5–5.0)
Alkaline Phosphatase: 175 U/L — ABNORMAL HIGH (ref 38–126)
Anion gap: 11 (ref 5–15)
BUN: 21 mg/dL (ref 8–23)
CO2: 29 mmol/L (ref 22–32)
Calcium: 8.1 mg/dL — ABNORMAL LOW (ref 8.9–10.3)
Chloride: 91 mmol/L — ABNORMAL LOW (ref 98–111)
Creatinine, Ser: 1.31 mg/dL — ABNORMAL HIGH (ref 0.44–1.00)
GFR, Estimated: 41 mL/min — ABNORMAL LOW (ref >60)
Glucose, Bld: 124 mg/dL — ABNORMAL HIGH (ref 70–99)
Potassium: 3.1 mmol/L — ABNORMAL LOW (ref 3.5–5.1)
Sodium: 131 mmol/L — ABNORMAL LOW (ref 135–145)
Total Bilirubin: 1.3 mg/dL — ABNORMAL HIGH (ref 0.3–1.2)
Total Protein: 5.3 g/dL — ABNORMAL LOW (ref 6.5–8.1)

## 2022-02-24 LAB — BRAIN NATRIURETIC PEPTIDE: B Natriuretic Peptide: 3448.1 pg/mL — ABNORMAL HIGH (ref 0.0–100.0)

## 2022-02-24 LAB — PROCALCITONIN: Procalcitonin: 0.31 ng/mL

## 2022-02-24 MED ORDER — DOXYCYCLINE HYCLATE 100 MG PO TABS
100.0000 mg | ORAL_TABLET | Freq: Two times a day (BID) | ORAL | Status: DC
  Administered 2022-02-24 – 2022-02-28 (×9): 100 mg via ORAL
  Filled 2022-02-24 (×9): qty 1

## 2022-02-24 MED ORDER — METOPROLOL TARTRATE 50 MG PO TABS
75.0000 mg | ORAL_TABLET | Freq: Two times a day (BID) | ORAL | Status: DC
  Administered 2022-02-24 – 2022-03-01 (×12): 75 mg via ORAL
  Filled 2022-02-24 (×12): qty 1

## 2022-02-24 MED ORDER — SODIUM CHLORIDE 0.9 % IV SOLN
1.0000 g | INTRAVENOUS | Status: DC
Start: 2022-02-24 — End: 2022-03-01
  Administered 2022-02-24 – 2022-02-28 (×5): 1 g via INTRAVENOUS
  Filled 2022-02-24 (×5): qty 10

## 2022-02-24 MED ORDER — POTASSIUM CHLORIDE CRYS ER 20 MEQ PO TBCR
40.0000 meq | EXTENDED_RELEASE_TABLET | Freq: Every day | ORAL | Status: AC
  Administered 2022-02-24 – 2022-02-25 (×2): 40 meq via ORAL
  Filled 2022-02-24 (×2): qty 2

## 2022-02-24 MED ORDER — POTASSIUM CHLORIDE CRYS ER 20 MEQ PO TBCR
40.0000 meq | EXTENDED_RELEASE_TABLET | Freq: Once | ORAL | Status: AC
Start: 2022-02-24 — End: 2022-02-24
  Administered 2022-02-24: 40 meq via ORAL
  Filled 2022-02-24 (×2): qty 2

## 2022-02-24 MED ORDER — FUROSEMIDE 10 MG/ML IJ SOLN
20.0000 mg | Freq: Every day | INTRAMUSCULAR | Status: DC
  Administered 2022-02-24 – 2022-02-26 (×3): 20 mg via INTRAVENOUS
  Filled 2022-02-24 (×3): qty 2

## 2022-02-24 NOTE — Evaluation (Signed)
Occupational Therapy Evaluation ?Patient Details ?Name: Carrie [name] ?MRN: [id number] ?DOB: May 19, 1941 ?Today's Date: 02/24/2022 ? ? ?History of Present Illness Pt is a 81 y/o female admitted secondary to SOB and lightheadedness. Found to be in a fib. Pt with recent admission for same. PMH includes non-hodgkins lymphoma, a fib s/p cardioversion, CHF, and CKD.  ? ?Clinical Impression ?  ?81 yo female with recent admission for a fib s/p cardioversion presents with SOB and lightheadedness found to be in a fib. Pt supine on arrival with some SOB. She is able to carry on conversation and SpO2 WNL while on 2L. OT assisting pt with repositioning in more upright posture with some improvement with work of breathing and ability to converse more comfortably with OT. OT providing extensive education in importance of increasing mobility but pt refusing stating she just needs to rest. RN made aware of SOB and OOB refusal. Will continue to follow acutely to progress OOB activity. ?   ? ?Recommendations for follow up therapy are one component of a multi-disciplinary discharge planning process, led by the attending physician.  Recommendations may be updated based on patient status, additional functional criteria and insurance authorization.  ? ?Follow Up Recommendations ? Home health OT  ?  ?Assistance Recommended at Discharge Intermittent Supervision/Assistance  ?Patient can return home with the following Direct supervision/assist for medications management;A little help with bathing/dressing/bathroom;A little help with walking and/or transfers ? ?  ?Functional Status Assessment ? Patient has had a recent decline in their functional status and demonstrates the ability to make significant improvements in function in a reasonable and predictable amount of time.  ?Equipment Recommendations ? None recommended by OT  ?  ?Recommendations for Other Services PT consult ? ? ?  ?Precautions / Restrictions Precautions ?Precautions:  Fall ?Precaution Comments: watch HR  ? ?  ? ?Mobility Bed Mobility ?Overal bed mobility: Needs Assistance ?Bed Mobility: Supine to Sit ?  ?  ?Supine to sit: Supervision ?  ?  ?General bed mobility comments: refusing all OOB activity despite encouragement, assisted with repositioning in bed and elevating HOB to eat meal (assist for posterior supine scoot to improve positioning, pt appears SOB but SpO2 intact) ?  ? ? ?  ? ?  ?   ? ?ADL either performed or assessed with clinical judgement  ? ?ADL   ?Eating/Feeding: Independent;Bed level ?  ?Grooming: Set up;Bed level ?  ?Upper Body Bathing: Set up;Bed level ?  ?  ?  ?Upper Body Dressing : Bed level;Set up ?  ?Lower Body Dressing: Minimal assistance;Bed level;Moderate assistance ?  ?Toilet Transfer: Minimal assistance (reports getting up to [facility] with assist from staff, refusing at this time) ?  ?Toileting- Clothing Manipulation and Hygiene: Bed level (currently purewick in place) ?  ?  ?  ?  ?   ? ? ? ?Vision Baseline Vision/History: 1 Wears glasses ?Ability to See in Adequate Light: 0 Adequate ?Patient Visual Report: No change from baseline ?Vision Assessment?: No apparent visual deficits  ?   ?   ?   ? ?Pertinent Vitals/Pain Pain Assessment ?Pain Assessment: No/denies pain  ? ? ? ?Hand Dominance Right ?  ?Extremity/Trunk Assessment Upper Extremity Assessment ?Upper Extremity Assessment: Overall WFL for tasks assessed (hx L TSA) ?  ?Lower Extremity Assessment ?Lower Extremity Assessment: Defer to PT evaluation (pt reports hx TKA) ?  ?  ?  ?Communication Communication ?Communication: No difficulties ?  ?Cognition Arousal/Alertness: Awake/alert ?Behavior During Therapy: Flat affect ?Overall Cognitive Status: Within Functional Limits  for tasks assessed ?  ?  ?  ?  ?  ?  ?  ?General Comments: answer orientation questions 4/4 ?  ?  ?   ?   ?   ? ? ?Home Living Family/patient expects to be discharged to:: Private residence ?Living Arrangements: Alone ?Available Help at  Discharge: Family ?Type of Home: House ?Home Access: Ramped entrance ?  ?  ?Home Layout: One level ?  ?  ?Bathroom Shower/Tub: Walk-in shower ?  ?Bathroom Toilet: Handicapped height ?  ?  ?Home Equipment: Grab bars - toilet;Electric scooter;Wheelchair - manual;BSC/3in1;Shower seat - built in;Cane - single Barista (2 wheels) ?  ?  ?  ? ?  ?Prior Functioning/Environment Prior Level of Function : Independent/Modified Independent ?  ?  ?  ?  ?  ?  ?Mobility Comments: provided extensive educaiton in importance of OOB activity to prevent further debility but pt refusing and reports need to remain in bed, agreeable to requesting assist to bedside chair for dinner this evening ?  ?  ? ?  ?  ?OT Problem List: Cardiopulmonary status limiting activity;Decreased activity tolerance;Decreased strength;Impaired balance (sitting and/or standing) ?  ?   ?OT Treatment/Interventions: Self-care/ADL training;DME and/or AE instruction;Therapeutic activities;Balance training;Patient/family education  ?  ?OT Goals(Current goals can be found in the care plan section) Acute Rehab OT Goals ?Patient Stated Goal: pt goal is to rest, she did indicate she would like to move more easily ?OT Goal Formulation: With patient ?Time For Goal Achievement: 03/10/22 ?Potential to Achieve Goals: Good ?ADL Goals ?Pt Will Perform Lower Body Bathing: with modified independence;sit to/from stand ?Pt Will Perform Lower Body Dressing: with modified independence;sit to/from stand ?Pt Will Transfer to Toilet: with modified independence;ambulating ?Pt Will Perform Toileting - Clothing Manipulation and hygiene: with modified independence  ?OT Frequency: Min 2X/week ?  ? ?   ?AM-PAC OT "6 Clicks" Daily Activity     ?Outcome Measure Help from another person eating meals?: None ?Help from another person taking care of personal grooming?: None ?Help from another person toileting, which includes using toliet, bedpan, or urinal?: A Little ?Help from another  person bathing (including washing, rinsing, drying)?: A Little ?Help from another person to put on and taking off regular upper body clothing?: A Little ?Help from another person to put on and taking off regular lower body clothing?: A Little ?6 Click Score: 20 ?  ?End of Session Nurse Communication: Mobility status ? ?Activity Tolerance: Patient limited by fatigue ?Patient left: in bed ? ?OT Visit Diagnosis: Unsteadiness on feet (R26.81);Muscle weakness (generalized) (M62.81)  ?              ?Time: 8563-1497 ?OT Time Calculation (min): 23 min ?Charges:  OT General Charges ?$OT Visit: 1 Visit ?OT Evaluation ?$OT Eval Low Complexity: 1 Low ? ? ?Kasandra Knudsen, OTR/L ?02/24/2022, 2:03 PM ?

## 2022-02-24 NOTE — Plan of Care (Signed)
?  Problem: Cardiac: ?Goal: Ability to achieve and maintain adequate cardiopulmonary perfusion will improve ?Outcome: Progressing ?  ?Problem: Clinical Measurements: ?Goal: Respiratory complications will improve ?Outcome: Progressing ?Goal: Cardiovascular complication will be avoided ?Outcome: Progressing ?  ?

## 2022-02-24 NOTE — Discharge Instructions (Signed)
You have an appointment set up with the [facility].  Multiple studies have shown that being followed by a dedicated atrial fibrillation clinic in addition to the standard care you receive from your other physicians improves health. We believe that enrollment in the atrial fibrillation clinic will allow [place] to better care for you.  ? ?The phone number to the [facility] is [phone]. The clinic is staffed Monday through Friday from 8:30am to 5pm. ? ?Parking Directions: The clinic is located in the Heart and Vascular Building connected to [facility]. ?1)From [address] turn on to [address] and go to the 3rd entrance  (Heart and Vascular entrance) on the right. ?2)Look to the right for Heart &Vascular Parking Garage. ?3)A code for the entrance is required, for May is 1002.   ?4)Take the elevators to the 1st floor. Registration is in the room with the glass walls at the end of the hallway. ? ?If you have any trouble parking or locating the clinic, please don?t hesitate to call [phone].  ?

## 2022-02-24 NOTE — Progress Notes (Addendum)
? ?Progress Note ? ?Patient Name: Carrie [name] ?Date of Encounter: 02/24/2022 ? ?[facility] Cardiologist: [name], MD  ? ?Subjective  ? ?No CP, palpitatins, denies SOB, tells me she has been OOB yesterday, did "OK" ? ?Inpatient Medications  ?  ?Scheduled Meds: ? amiodarone  200 mg Oral BID  ? apixaban  5 mg Oral BID  ? Chlorhexidine Gluconate Cloth  6 each Topical Daily  ? levothyroxine  88 mcg Oral QAC breakfast  ? metoprolol tartrate  50 mg Oral BID  ? sodium chloride flush  3 mL Intravenous Q12H  ? ?Continuous Infusions: ? sodium chloride    ? ?PRN Meds: ?sodium chloride, acetaminophen, ondansetron (ZOFRAN) IV, sodium chloride flush, sodium chloride flush  ? ?Vital Signs  ?  ?Vitals:  ? 02/23/22 2004 02/24/22 0015 02/24/22 0403 02/24/22 0458  ?BP: (!) 146/87 (!) 127/112 (!) 120/103 127/81  ?Pulse: 81 (!) 53 85 81  ?Resp: '19 19 19 20  '$ ?Temp: 98.5 ?F (36.9 ?C) 99.1 ?F (37.3 ?C) 98.5 ?F (36.9 ?C) 99 ?F (37.2 ?C)  ?TempSrc: Oral Oral Oral Oral  ?SpO2: 98% 95% 93% 98%  ?Weight:   61.2 kg   ?Height:      ? ? ?Intake/Output Summary (Last 24 hours) at 02/24/2022 0718 ?Last data filed at 02/24/2022 0600 ?Gross per 24 hour  ?Intake 480 ml  ?Output 650 ml  ?Net -170 ml  ? ? ?  02/24/2022  ?  4:03 AM 02/23/2022  ?  4:40 AM 02/23/2022  ?  3:33 AM  ?Last 3 Weights  ?Weight (lbs) 134 lb 14.7 oz 136 lb 0.4 oz 134 lb 0.6 oz  ?Weight (kg) 61.2 kg 61.7 kg 60.8 kg  ?   ? ?Telemetry  ?  ?AFib 90's-110's - Personally Reviewed ? ?ECG  ?  ?No new EKGs - Personally Reviewed ? ?Physical Exam  ? ?GEN: No acute distress.   ?Neck: No JVD ?Cardiac: irreg-irreg, no murmurs, rubs, or gallops.  ?Respiratory: CTA b/l. ?GI: Soft, nontender, non-distended  ?MS: No edema; No deformity. ?Neuro:  Nonfocal  ?Psych: Normal affect  ? ?Labs  ?  ?High Sensitivity Troponin:   ?Recent Labs  ?Lab 02/16/22 ?1441 02/16/22 ?1659 02/21/22 ?1338 02/21/22 ?1657  ?TROPONINIHS 49* 47* 38* 46*  ?   ?Chemistry ?Recent Labs  ?Lab 02/18/22 ?0238  02/21/22 ?1338 02/21/22 ?1657 02/22/22 ?0128 02/23/22 ?0202 02/24/22 ?9562  ?NA 130* 131*  --  133* 131* 131*  ?K 4.2 5.1  --  2.9* 3.9 3.1*  ?CL 99 97*  --  95* 95* 91*  ?CO2 21* 24  --  '27 24 29  '$ ?GLUCOSE 164* 216*  --  140* 157* 124*  ?BUN 13 15  --  '14 15 21  '$ ?CREATININE 1.19* 1.22*  --  1.23* 1.27* 1.31*  ?CALCIUM 7.8* 7.4*  --  8.0* 8.3* 8.1*  ?MG 2.1  --  1.3* 2.8*  --   --   ?PROT  --  4.6*  --  5.5*  --  5.3*  ?ALBUMIN  --  2.2*  --  3.2*  --  3.0*  ?AST  --  92*  --  72*  --  59*  ?ALT  --  56*  --  54*  --  56*  ?ALKPHOS  --  104  --  131*  --  175*  ?BILITOT  --  2.0*  --  1.0  --  1.3*  ?GFRNONAA 47* 45*  --  45* 43* 41*  ?  ANIONGAP 10 10  --  '11 12 11  '$ ?  ?Lipids No results for input(s): CHOL, TRIG, HDL, LABVLDL, LDLCALC, CHOLHDL in the last 168 hours.  ?Hematology ?Recent Labs  ?Lab 02/18/22 ?0238 02/21/22 ?1338 02/24/22 ?6283  ?WBC 1.4* 1.5* 1.6*  ?RBC 2.97* 3.44* 3.03*  ?HGB 8.9* 9.9* 8.7*  ?HCT 26.8* 31.0* 26.9*  ?MCV 90.2 90.1 88.8  ?MCH 30.0 28.8 28.7  ?MCHC 33.2 31.9 32.3  ?RDW 14.1 14.5 14.7  ?PLT 245 292 258  ? ?Thyroid  ?No results for input(s): TSH, FREET4 in the last 168 hours. ?  ?BNP ?Recent Labs  ?Lab 02/18/22 ?0238 02/21/22 ?1338  ?BNP 863.9* 956.5*  ?  ?DDimer No results for input(s): DDIMER in the last 168 hours.  ? ?Radiology  ?  ?DG Chest 1 View ? ?Result Date: 02/23/2022 ?CLINICAL DATA:  Shortness of breath and chest pain. EXAM: CHEST  1 VIEW COMPARISON:  Chest radiograph dated 02/21/2022. FINDINGS: Right-sided Port-A-Cath in similar position. There is cardiomegaly with vascular congestion and mild edema. Pneumonia is not excluded. No focal consolidation, pleural effusion or pneumothorax. No acute osseous pathology. Left shoulder arthroplasty. IMPRESSION: Cardiomegaly with vascular congestion and mild edema. Pneumonia is not excluded. Electronically Signed   By: Anner Crete M.D.   On: 02/23/2022 02:20   ? ?Cardiac Studies  ? ?R/L Cardiac catheterization 01/15/2022 ?RCA proximal  15 ?PCWP 11, mean PA 21, Fick CO 6.09 ?EF 50-55 ?  ?Echocardiogram 12/31/21 ?Inf HK, EF 50-55, normal PASP 38, normal RVSF, mild LAE, mild MR, mild to mod TR, mild AI, AV sclerosis w/o AS ?  ?Echocardiogram 10/31/2020 ?EF 55-60, GR 1 DD, GLS -17.9% (normal), normal RVSF, moderate LAE, mild-moderate MR, mild AI ?  ?Echocardiogram 05/24/2019 ?EF 55-60, GRII DD, normal RVSF, mild LAE, mild MR, trivial AI, PASP 28 ?  ?Echocardiogram 02/25/2019 ?EF 20-25, severely reduced RVSF, RVSP 23.5, mild BAE, trivial pericardial effusion, moderate MR, trivial AI ?  ?Echocardiogram 10/15/2015 ?EF 60-65, normal wall motion, severe LAE ?  ?Echo 06/26/15 ?Inferobasal AK, EF 55%, mild AI, mild MR, mild LAE ?  ?Echo 4/16 ?EF 55-60%, normal wall motion, grade 2 diastolic dysfunction, trivial AI, mild MR, moderate LAE, mild TR, trivial PI, PASP 33 mmHg ?  ?Myoview 11/10 ?Normal, EF 66% ?  ?Carotid US 6/05 ?No ICA stenosis ? ?Patient Profile  ?   ?81 y.o. female with a hx of follicular lymphoma grade III, post chemo,  on rituximab, hx of NICM w/recvered LVEF with SR,  non-obstructive CAD (cath 12/2021), hypothyroidism, CKD (III) and AFib ? ? ?AFib Hx ?Diagnosed 2016 in setting of urosepsis ?Flecainide, presumably stopped 2/2 development of CM ?Amiodarone 2020 stopped with maintenance of SR restarted March 2023 ?There is mention of hypotension with dilt  ? ? ?Admitted with reports of fast HRs, found in AFib w/RVR ?PO amio >> gtt and lopressor started ? ?Assessment & Plan  ?  ?Paroxysmal Afib ?CHA2DS2Vasc is 4, on Eliquis with no missed doses ?S/p DCCV 02/18/22 with ERAF ? ?Asymptomatic with her AFib ?Planned for rate control strategy with amio/and titrate BB to BP tolerance ?BP has room for another up-titration, will increase her lopressor to '75mg'$  bid ?OK to discharge from EP perspective when ready medically otherwise ? ?Dr. Quentin Ore has seen the patient this Am ? ?Amiodarone '200mg'$  BID x1 week ?Lopressor '75mg'$  BID ?AFib fllow up is in place for next  week ?Will ask they check LFTs next week, though by this AM labs stable ? ? ? ?For  questions or updates, please contact [place] ?Please consult [email or web address] for contact info under  ? ?  ?   ?Signed, ?Baldwin Jamaica, PA-C  ?02/24/2022, 7:18 AM   ? ?

## 2022-02-24 NOTE — Progress Notes (Signed)
Patient sat up on side of bed, but refusing to walk at this time.  States she is feeling to tired and weak.  Discussed the need to check pulse ox while ambulating to see if she will need home oxygen, patient continues to refuse. Will continue to encourage increased mobility.  ?

## 2022-02-24 NOTE — Progress Notes (Signed)
Patient incontinent of urine.  Purewick placed for accurate output measurement but leaks at times.  ?

## 2022-02-24 NOTE — Progress Notes (Signed)
Updated daughter Cindee Lame on patient's care with patient's permission.  Family requesting the Dr. Bonner Puna call daughter in law Marketia Stallsmith who is an Scientist, research (life sciences) with update, ph. [phone].  Will pass this request in report.  ?

## 2022-02-24 NOTE — Progress Notes (Signed)
?Progress Note ? ?Patient: Carrie [name] WNU:[id number] DOB: 1941/06/02  ?DOA: 02/21/2022  DOS: 02/24/2022  ?  ?Brief hospital course: ?Carrie [name] is a 81 y.o. female with a history of PAF s/p DCCV 5/3 with ERAF, chronic HFpEF, Follicular lymphoma stage IIIa s/p chemotherapy with pancytopenia, hypothyroidism, HTN, HLD, stage IIIb CKD, and chronic cough since recovering from covid-19 infection early 2023 who presented to the ED 5/7 with shortness of breath, palpitations, lightheadedness found to be afebrile with AFib with RVR. This improved with amiodarone infusion and upward titration of beta blocker overseen by EP consultant ? ?Assessment and Plan: ?Acute hypoxic respiratory failure: Note PASP 38 without morphologic features of pulmonary HTN.  ?- Check ambulatory pulse oximetry. Per RN this is difficult as the patient is exceedingly weak limiting exertion.  ?- Check CXR 2v ?- Check PCT. If positive and/or CXR concerning for asymmetric infiltrate, would initiate abx given her immunocompromised status. ?- Suspect she's got pulmonary edema based on exam, check BNP and initiate diuresis with strict I/O, daily weights. ?- Also suspect element of deconditioning with weakness and cough since she had covid-19 (a common "long covid" pattern in elderly patients in my experience). Maximize antitussives. Suggest she consider rehabilitation due to significant weakness and living alone. Will continue to have therapy consult.   ? ?PAF: s/p DCCV 02/18/2022 with ERAF. Asymptomatic. Dx 2016. Note hx of hypotension with diltiazem and discontinuation of flecainide in the past, presumably due to CM.  ?- Followed by EP here: Continue metoprolol '75mg'$  BID ?- Continue amiodarone '200mg'$  BID until AFib clinic follow up arranged 5/16 at 10:00am.  ?- Keep K and Mg replete.  ? ?Stage IIIa follicular lymphoma: Treated at [place].  ?- Continue oncology follow up.  ? ?Pancytopenia: Chronic. ANC < 1500. No bleeding. Afebrile. ?- Monitor daily. No  indication for , consider GCSF.  ? ?Stage IIIb CKD:  ?- Keep strict I/O. Minimize nephrotoxins. Needs diuresis.  ? ?LFT elevation: Overall improving. No cirrhosis by U/S. ?- Recheck LFTs given amiodarone initiation  ? ?Subjective: Feels weak and short of breath without chest pain or palpitations. She wishes to be discharged today. Lives alone. Has had purewick in place due to instability/weakness limiting ability to get to [facility]. I noted she requested discharge at prior hospitalization despite diuresis being recommended and she had to return quickly. She is agreeable to stay.  ? ?Objective: ?Vitals:  ? 02/24/22 0403 02/24/22 0458 02/24/22 0800 02/24/22 1204  ?BP: (!) 120/103 127/81 (!) 124/95 124/81  ?Pulse: 85 81 95 62  ?Resp: 19 20 (!) 21 (!) 23  ?Temp: 98.5 ?F (36.9 ?C) 99 ?F (37.2 ?C) 98.2 ?F (36.8 ?C) 98.2 ?F (36.8 ?C)  ?TempSrc: Oral Oral Oral Oral  ?SpO2: 93% 98% 95% 98%  ?Weight: 61.2 kg     ?Height:      ? ?Gen: Frail 81 y.o. female in no distress ?Pulm: Nonlabored at rest, tachypneic with just rising for pulmonary exam, crackles at bases and halfway up right side. No wheezes. ?CV: Irreg irreg, rate in 80's. No murmur, rub, or gallop. No JVD, no dependent edema. ?GI: Abdomen soft, non-tender, non-distended, with normoactive bowel sounds.  ?Ext: Warm, no deformities ?Skin: No rashes, lesions or ulcers on visualized skin. ?Neuro: Alert and oriented. No focal neurological deficits. ?Psych: Judgement and insight appear fair. Mood euthymic & affect congruent. Behavior is appropriate.   ? ?Data Personally reviewed: ? ?CBC: ?Recent Labs  ?Lab 02/18/22 ?0238 02/21/22 ?1338 02/24/22 ?0347  ?WBC 1.4* 1.5*  1.6*  ?NEUTROABS 0.8* 1.0* 1.1*  ?HGB 8.9* 9.9* 8.7*  ?HCT 26.8* 31.0* 26.9*  ?MCV 90.2 90.1 88.8  ?PLT 245 292 258  ? ?Basic Metabolic Panel: ?Recent Labs  ?Lab 02/17/22 ?1910 02/18/22 ?0238 02/21/22 ?1338 02/21/22 ?1657 02/22/22 ?0128 02/23/22 ?0202 02/24/22 ?5643  ?NA  --  130* 131*  --  133* 131* 131*  ?K  --  4.2  5.1  --  2.9* 3.9 3.1*  ?CL  --  99 97*  --  95* 95* 91*  ?CO2  --  21* 24  --  '27 24 29  '$ ?GLUCOSE  --  164* 216*  --  140* 157* 124*  ?BUN  --  13 15  --  '14 15 21  '$ ?CREATININE  --  1.19* 1.22*  --  1.23* 1.27* 1.31*  ?CALCIUM  --  7.8* 7.4*  --  8.0* 8.3* 8.1*  ?MG 1.7 2.1  --  1.3* 2.8*  --   --   ? ?GFR: ?Estimated Creatinine Clearance: 29.2 mL/min (A) (by C-G formula based on SCr of 1.31 mg/dL (H)). ?Liver Function Tests: ?Recent Labs  ?Lab 02/21/22 ?1338 02/22/22 ?0128 02/24/22 ?3295  ?AST 92* 72* 59*  ?ALT 56* 54* 56*  ?ALKPHOS 104 131* 175*  ?BILITOT 2.0* 1.0 1.3*  ?PROT 4.6* 5.5* 5.3*  ?ALBUMIN 2.2* 3.2* 3.0*  ? ?No results for input(s): LIPASE, AMYLASE in the last 168 hours. ?No results for input(s): AMMONIA in the last 168 hours. ?Coagulation Profile: ?No results for input(s): INR, PROTIME in the last 168 hours. ?Cardiac Enzymes: ?No results for input(s): CKTOTAL, CKMB, CKMBINDEX, TROPONINI in the last 168 hours. ?BNP (last 3 results) ?No results for input(s): PROBNP in the last 8760 hours. ?HbA1C: ?No results for input(s): HGBA1C in the last 72 hours. ?CBG: ?No results for input(s): GLUCAP in the last 168 hours. ?Lipid Profile: ?No results for input(s): CHOL, HDL, LDLCALC, TRIG, CHOLHDL, LDLDIRECT in the last 72 hours. ?Thyroid Function Tests: ?No results for input(s): TSH, T4TOTAL, FREET4, T3FREE, THYROIDAB in the last 72 hours. ?Anemia Panel: ?No results for input(s): VITAMINB12, FOLATE, FERRITIN, TIBC, IRON, RETICCTPCT in the last 72 hours. ?Urine analysis: ?   ?Component Value Date/Time  ? COLORURINE YELLOW 02/21/2022 1340  ? APPEARANCEUR CLEAR 02/21/2022 1340  ? LABSPEC 1.010 02/21/2022 1340  ? LABSPEC 1.005 03/18/2017 1315  ? PHURINE 5.0 02/21/2022 1340  ? GLUCOSEU NEGATIVE 02/21/2022 1340  ? GLUCOSEU Negative 03/18/2017 1315  ? HGBUR SMALL (A) 02/21/2022 1340  ? [place] NEGATIVE 02/21/2022 1340  ? BILIRUBINUR Negative 03/18/2017 1315  ? [place] NEGATIVE 02/21/2022 1340  ? PROTEINUR NEGATIVE  02/21/2022 1340  ? UROBILINOGEN 0.2 03/18/2017 1315  ? NITRITE NEGATIVE 02/21/2022 1340  ? LEUKOCYTESUR NEGATIVE 02/21/2022 1340  ? LEUKOCYTESUR Negative 03/18/2017 1315  ? ?Recent Results (from the past 240 hour(s))  ?Urine Culture     Status: Abnormal  ? Collection Time: 02/16/22  3:23 PM  ? Specimen: Urine, Clean Catch  ?Result Value Ref Range Status  ? Specimen Description URINE, CLEAN CATCH  Final  ? Special Requests   Final  ?  NONE ?Performed at [facility], [place] [address] [place], [place] [address] ?  ? Culture >=100,000 COLONIES/mL ESCHERICHIA COLI (A)  Final  ? Report Status 02/18/2022 FINAL  Final  ? Organism ID, Bacteria ESCHERICHIA COLI (A)  Final  ?    Susceptibility  ? Escherichia coli - MIC*  ?  AMPICILLIN 4 SENSITIVE Sensitive   ?  CEFAZOLIN <=4  SENSITIVE Sensitive   ?  CEFEPIME <=0.12 SENSITIVE Sensitive   ?  CEFTRIAXONE <=0.25 SENSITIVE Sensitive   ?  CIPROFLOXACIN <=0.25 SENSITIVE Sensitive   ?  GENTAMICIN <=1 SENSITIVE Sensitive   ?  IMIPENEM <=0.25 SENSITIVE Sensitive   ?  NITROFURANTOIN <=16 SENSITIVE Sensitive   ?  TRIMETH/SULFA <=20 SENSITIVE Sensitive   ?  AMPICILLIN/SULBACTAM <=2 SENSITIVE Sensitive   ?  PIP/TAZO <=4 SENSITIVE Sensitive   ?  * >=100,000 COLONIES/mL ESCHERICHIA COLI  ?   ?DG Chest 1 View ? ?Result Date: 02/23/2022 ?CLINICAL DATA:  Shortness of breath and chest pain. EXAM: CHEST  1 VIEW COMPARISON:  Chest radiograph dated 02/21/2022. FINDINGS: Right-sided Port-A-Cath in similar position. There is cardiomegaly with vascular congestion and mild edema. Pneumonia is not excluded. No focal consolidation, pleural effusion or pneumothorax. No acute osseous pathology. Left shoulder arthroplasty. IMPRESSION: Cardiomegaly with vascular congestion and mild edema. Pneumonia is not excluded. Electronically Signed   By: Anner Crete M.D.   On: 02/23/2022 02:20   ? ?Family Communication: None at bedside ? ?Disposition: ?Status is: Inpatient ?Remains inpatient appropriate because:  Persistent acute hypoxic respiratory failure requiring work up and management ?Planned Discharge Destination: Home with Home Health ? ? ? ? ? ?Patrecia Pour, MD ?02/24/2022 2:45 PM ?Page by [email or web address]  ?

## 2022-02-25 ENCOUNTER — Ambulatory Visit (HOSPITAL_COMMUNITY): Payer: No Typology Code available for payment source | Admitting: Physician Assistant

## 2022-02-25 DIAGNOSIS — I4891 Unspecified atrial fibrillation: Secondary | ICD-10-CM | POA: Diagnosis not present

## 2022-02-25 DIAGNOSIS — I5033 Acute on chronic diastolic (congestive) heart failure: Secondary | ICD-10-CM | POA: Diagnosis not present

## 2022-02-25 DIAGNOSIS — I482 Chronic atrial fibrillation, unspecified: Secondary | ICD-10-CM | POA: Diagnosis not present

## 2022-02-25 DIAGNOSIS — C8238 Follicular lymphoma grade IIIa, lymph nodes of multiple sites: Secondary | ICD-10-CM | POA: Diagnosis not present

## 2022-02-25 LAB — BASIC METABOLIC PANEL
Anion gap: 6 (ref 5–15)
BUN: 23 mg/dL (ref 8–23)
CO2: 28 mmol/L (ref 22–32)
Calcium: 8 mg/dL — ABNORMAL LOW (ref 8.9–10.3)
Chloride: 96 mmol/L — ABNORMAL LOW (ref 98–111)
Creatinine, Ser: 1.38 mg/dL — ABNORMAL HIGH (ref 0.44–1.00)
GFR, Estimated: 39 mL/min — ABNORMAL LOW (ref >60)
Glucose, Bld: 189 mg/dL — ABNORMAL HIGH (ref 70–99)
Potassium: 4.2 mmol/L (ref 3.5–5.1)
Sodium: 130 mmol/L — ABNORMAL LOW (ref 135–145)

## 2022-02-25 LAB — CBC WITH DIFFERENTIAL/PLATELET
Abs Immature Granulocytes: 0 10*3/uL (ref 0.00–0.07)
Basophils Absolute: 0 10*3/uL (ref 0.0–0.1)
Basophils Relative: 0 %
Eosinophils Absolute: 0 10*3/uL (ref 0.0–0.5)
Eosinophils Relative: 1 %
HCT: 27.9 % — ABNORMAL LOW (ref 36.0–46.0)
Hemoglobin: 9.1 g/dL — ABNORMAL LOW (ref 12.0–15.0)
Lymphocytes Relative: 7 %
Lymphs Abs: 0.1 10*3/uL — ABNORMAL LOW (ref 0.7–4.0)
MCH: 28.9 pg (ref 26.0–34.0)
MCHC: 32.6 g/dL (ref 30.0–36.0)
MCV: 88.6 fL (ref 80.0–100.0)
Monocytes Absolute: 0.1 10*3/uL (ref 0.1–1.0)
Monocytes Relative: 7 %
Neutro Abs: 1.5 10*3/uL — ABNORMAL LOW (ref 1.7–7.7)
Neutrophils Relative %: 85 %
Platelets: 278 10*3/uL (ref 150–400)
RBC: 3.15 MIL/uL — ABNORMAL LOW (ref 3.87–5.11)
RDW: 14.7 % (ref 11.5–15.5)
WBC: 1.8 10*3/uL — ABNORMAL LOW (ref 4.0–10.5)
nRBC: 0 % (ref 0.0–0.2)
nRBC: 1 /100 WBC — ABNORMAL HIGH

## 2022-02-25 LAB — PROCALCITONIN: Procalcitonin: 0.29 ng/mL

## 2022-02-25 NOTE — Plan of Care (Signed)
?  Problem: Activity: ?Goal: Ability to tolerate increased activity will improve ?Outcome: Not Progressing ?  ?Problem: Cardiac: ?Goal: Ability to achieve and maintain adequate cardiopulmonary perfusion will improve ?Outcome: Progressing ?  ?Problem: Clinical Measurements: ?Goal: Ability to maintain clinical measurements within normal limits will improve ?Outcome: Progressing ?Goal: Will remain free from infection ?Outcome: Progressing ?Goal: Cardiovascular complication will be avoided ?Outcome: Progressing ?  ?

## 2022-02-25 NOTE — Progress Notes (Signed)
Physical Therapy Treatment ?Patient Details ?Name: Carrie [name] ?MRN: [id number] ?DOB: [birth year] ?Today's Date: 02/25/2022 ? ? ?History of Present Illness Pt is a 81 y/o female admitted secondary to SOB and lightheadedness. Found to be in a fib. Pt with recent admission for same. PMH includes non-hodgkins lymphoma, a fib s/p cardioversion, CHF, and CKD. ? ?  ?PT Comments  ? ? Pt initially in agreement to complete walking O2 sats, but then becomes nauseous and begins vomiting and is unable to progress past sitting EOB.  Pt returns back to bed with mod I but requires min A for scooting/positioning in bed.  NSG present and aware of nausea and pt refusal to continue with testing at this time.  PT to continue to encourage OOB activity to patient tolerance.   ?Recommendations for follow up therapy are one component of a multi-disciplinary discharge planning process, led by the attending physician.  Recommendations may be updated based on patient status, additional functional criteria and insurance authorization. ? ?Follow Up Recommendations ? Home health PT ?  ?  ?Assistance Recommended at Discharge Intermittent Supervision/Assistance  ?Patient can return home with the following A little help with walking and/or transfers;A little help with bathing/dressing/bathroom;Help with stairs or ramp for entrance ?  ?Equipment Recommendations ?  (Pt has RW but states she doesn't use it)  ?  ?Recommendations for Other Services   ? ? ?  ?Precautions / Restrictions Precautions ?Precautions: Fall ?Precaution Comments: watch HR  ?  ? ?Mobility ? Bed Mobility ?Overal bed mobility: Needs Assistance ?Bed Mobility: Sit to Supine ?  ?  ?Supine to sit: Modified independent (Device/Increase time) ?  ?  ?General bed mobility comments: O2 sat level at rest on 2L is noted to be 96%.  After ~8 min with O2 off, noted to be 91%.  When PT returns to room after bed bath, pt is sitting up EOB brushing teeth.  PT assists pt with donning gown for  backside when pt becomes very nauseous and begins vomiting.  NSG notified.  Pt. returns to supine in bed stating that she is exhausted from her bed bath and vomiting and no longer wants to do the walking O2 test at this time.  Requires min A to scoot up in bed with use of chuck pad.  O2 sats checked at end of activity and are noted to be 94% on RA.  NSG is agreement to leave pt on RA for time being.  No further PT tx rendered at this time. ?  ? ?Transfers ?  ?  ?  ?  ?  ?  ?  ?  ?  ?  ?  ? ?Ambulation/Gait ?  ?  ?  ?  ?  ?  ?  ?  ? ? ?Stairs ?  ?  ?  ?  ?  ? ? ?Wheelchair Mobility ?  ? ?Modified Rankin (Stroke Patients Only) ?  ? ? ?  ?Balance   ?  ?  ?  ?  ?  ?  ?  ?  ?  ?  ?  ?  ?  ?  ?  ?  ?  ?  ?  ? ?  ?Cognition Arousal/Alertness: Awake/alert ?Behavior During Therapy: [facility] for tasks assessed/performed ?Overall Cognitive Status: Within Functional Limits for tasks assessed ?  ?  ?  ?  ?  ?  ?  ?  ?  ?  ?  ?  ?  ?  ?  ?  ?  General Comments: Pt is supine in bed when PT arrives.  Agreeable to do walking O2 sat test.  O2 is removed to get sat level at rest and PT leaves to go get O2 tank.  When PT returns, NT is present and starting bed bath.  PT waits outside of room to complete O2 testing after bed bath. ?  ?  ? ?  ?Exercises   ? ?  ?General Comments   ?  ?  ? ?Pertinent Vitals/Pain Pain Assessment ?Pain Assessment: 0-10 ?Pain Score: 0-No pain  ? ? ?Home Living   ?  ?  ?  ?  ?  ?  ?  ?  ?  ?   ?  ?Prior Function    ?  ?  ?   ? ?PT Goals (current goals can now be found in the care plan section) Progress towards PT goals: Not progressing toward goals - comment (Unable to progress today due to N/V) ? ?  ?Frequency ? ? ? Min 3X/week ? ? ? ?  ?PT Plan Current plan remains appropriate  ? ? ?Co-evaluation   ?  ?  ?  ?  ? ?  ?AM-PAC PT "6 Clicks" Mobility   ?Outcome Measure ? Help needed turning from your back to your side while in a flat bed without using bedrails?: None ?Help needed moving from lying on your back to  sitting on the side of a flat bed without using bedrails?: A Little ?Help needed moving to and from a bed to a chair (including a wheelchair)?: A Little ?Help needed standing up from a chair using your arms (e.g., wheelchair or bedside chair)?: A Little ?Help needed to walk in hospital room?: A Little ?Help needed climbing 3-5 steps with a railing? : A Little ?6 Click Score: 19 ? ?  ?End of Session   ?Activity Tolerance: Patient limited by fatigue;Other (comment) (Limited by N/V) ?Patient left: in bed;with call bell/phone within reach;with bed alarm set ?Nurse Communication: Mobility status ?  ?  ? ? ?Time: 1040-1101 ?PT Time Calculation (min) (ACUTE ONLY): 21 min ? ?Charges:  $Therapeutic Activity: 8-22 mins          ?          ? ?Carrie [name] A. Bethanie Bloxom, PT, DPT ?Acute Rehabilitation Services ?Office: [phone]  ? ? ?[place] ?02/25/2022, 11:10 AM ? ?

## 2022-02-25 NOTE — Progress Notes (Signed)
?  Mobility Specialist Criteria Algorithm Info. ? ? 02/25/22 1450  ?Oxygen Therapy  ?SpO2 93 %  ?O2 Device Nasal Cannula  ?Patient Activity (if Appropriate) Ambulating  ?Pulse Oximetry Type Continuous  ?Mobility  ?Activity Ambulated with assistance in hallway  ?Range of Motion/Exercises Active;All extremities  ?Level of Assistance Minimal assist, patient does 75% or more  ?Assistive Device Other (Comment) ?(HHA + wall rails)  ?Distance Ambulated (ft) 60 ft  ?Activity Response Tolerated fair;RN notified  ? ?Patient received in bed eating lunch, agreeable to participate in mobility. Reported feeling much better than earlier encounter to w/PT. Was independent to EOB but c/o lightheadedness that subsided quickly. Ambulated in hallway initially min guard but progressively became more unsteady w/LOBx3 requiring minimal HHA. C/o "weakness" requiring standing rest recover x1 and seated rest x1. Would not admit to dizziness despite positive S/Sx. Attempted ambulatory O2 sat but with poor signal plethora, obtained inconsistent readings. Pt was symptomatic when reading 84% on RA and asx when reading 93% on 2LO2.  Returned to room and was left in supine with all needs met, call bell in reach.  ? ?02/25/2022 ?3:27 PM ? ?[place] Aislee Landgren, CMS, BS EXP ?Acute Rehabilitation Services  ?TWKMQ:[phone] ?Office: [phone] ? ?

## 2022-02-25 NOTE — Progress Notes (Addendum)
?Progress Note ? ?Patient: Carrie [name] YHC:[id number] DOB: 03-27-1941  ?DOA: 02/21/2022  DOS: 02/25/2022  ?  ?Brief hospital course: ?Carrie [name] is a 81 y.o. female with a history of PAF s/p DCCV 5/3 with ERAF, chronic HFpEF, Follicular lymphoma stage IIIa s/p chemotherapy with pancytopenia, hypothyroidism, HTN, HLD, stage IIIb CKD, and chronic cough since recovering from covid-19 infection early 2023 who presented to the ED 5/7 with shortness of breath, palpitations, lightheadedness found to be afebrile with AFib with RVR. This improved with amiodarone infusion and upward titration of beta blocker overseen by EP consultant ? ?Assessment and Plan: ?Acute hypoxic respiratory failure: Note PASP 38 without morphologic features of pulmonary HTN. Remains hypoxic with ambulation.  ?- Repeat lasix this AM. CXR personally reviewed 5/10 with improving interstitial prominence but still suggests pulmonary edema consistent with exam.  ?- PCT 0.31. With low WBC, cough, will initiate 5 days typical abx. (Doxy instead of azithro given rhythm issues). Sputum culture sent and pending. ?- Also suspect element of deconditioning with weakness and cough since she had covid-19 (a common "long covid" pattern in elderly patients in my experience). Maximize antitussives. Suggest she consider rehabilitation due to significant weakness and living alone. Will continue to have therapy consult. We are working diligently to mobilize the patient.  ? ?PAF: s/p DCCV 02/18/2022 with ERAF. Asymptomatic. Dx 2016. Note hx of hypotension with diltiazem and discontinuation of flecainide in the past, presumably due to CM.  ?- Followed by EP here: Continue metoprolol '75mg'$  BID ?- Continue amiodarone '200mg'$  BID until AFib clinic follow up arranged 5/16 at 10:00am.  ?- Keep K and Mg replete.  ? ?Stage IIIa follicular lymphoma: Treated at [place].  ?- Continue oncology follow up. Discussed with Dr. Alvy Bimler who reports last scan showed NED, advises against GCSF at  this time. ? ?Leukopenia and anemia: Chronic. ANC < 1500. No bleeding. Afebrile. ?- Monitor daily. This is stable and can be monitored at follow up. ? ?Stage IIIb CKD:  ?- Keep strict I/O. Minimize nephrotoxins. Needs diuresis.  ? ?LFT elevation: Overall improving. No cirrhosis by U/S. ?- Recheck LFTs at follow up is recommended. ? ?Subjective: Feels weak and short of breath without chest pain or palpitations. She wishes to be discharged today. Lives alone. Has had purewick in place due to instability/weakness limiting ability to get to [facility]. I noted she requested discharge at prior hospitalization despite diuresis being recommended and she had to return quickly. She is agreeable to stay.  ? ?Objective: ?Vitals:  ? 02/25/22 0050 02/25/22 0440 02/25/22 1427 02/25/22 1450  ?BP: 102/69 (!) 128/99 107/85   ?Pulse: 93 94 99   ?Resp: '19 17 14   '$ ?Temp: 97.7 ?F (36.5 ?C) 97.6 ?F (36.4 ?C) 97.9 ?F (36.6 ?C)   ?TempSrc: Oral Axillary Oral   ?SpO2: 100% 100% 100% 93%  ?Weight:  61 kg    ?Height:      ? ?Gen: 81 y.o. female in no distress ?Pulm: Nonlabored on room air, still with bilateral crackles, no wheezes. ?CV: Irreg irreg with soft systolic murmur, rub, or gallop. No JVD, minimal dependent edema. ?GI: Abdomen soft, non-tender, non-distended, with normoactive bowel sounds.  ?Ext: Warm, no deformities ?Skin: No new rashes, lesions or ulcers on visualized skin. ?Neuro: Alert and oriented. No focal neurological deficits. ?Psych: Judgement and insight appear fair. Mood euthymic & affect congruent. Behavior is appropriate.   ? ?Data Personally reviewed: ? ?CBC: ?Recent Labs  ?Lab 02/21/22 ?1338 02/24/22 ?5176 02/25/22 ?0257  ?WBC  1.5* 1.6* 1.8*  ?NEUTROABS 1.0* 1.1* 1.5*  ?HGB 9.9* 8.7* 9.1*  ?HCT 31.0* 26.9* 27.9*  ?MCV 90.1 88.8 88.6  ?PLT 292 258 278  ? ?Basic Metabolic Panel: ?Recent Labs  ?Lab 02/21/22 ?1338 02/21/22 ?1657 02/22/22 ?0128 02/23/22 ?0202 02/24/22 ?8242 02/25/22 ?0257  ?NA 131*  --  133* 131* 131* 130*  ?K 5.1   --  2.9* 3.9 3.1* 4.2  ?CL 97*  --  95* 95* 91* 96*  ?CO2 24  --  '27 24 29 28  '$ ?GLUCOSE 216*  --  140* 157* 124* 189*  ?BUN 15  --  '14 15 21 23  '$ ?CREATININE 1.22*  --  1.23* 1.27* 1.31* 1.38*  ?CALCIUM 7.4*  --  8.0* 8.3* 8.1* 8.0*  ?MG  --  1.3* 2.8*  --   --   --   ? ?GFR: ?Estimated Creatinine Clearance: 27.7 mL/min (A) (by C-G formula based on SCr of 1.38 mg/dL (H)). ?Liver Function Tests: ?Recent Labs  ?Lab 02/21/22 ?1338 02/22/22 ?0128 02/24/22 ?3536  ?AST 92* 72* 59*  ?ALT 56* 54* 56*  ?ALKPHOS 104 131* 175*  ?BILITOT 2.0* 1.0 1.3*  ?PROT 4.6* 5.5* 5.3*  ?ALBUMIN 2.2* 3.2* 3.0*  ? ?Urine analysis: ?   ?Component Value Date/Time  ? COLORURINE YELLOW 02/21/2022 1340  ? APPEARANCEUR CLEAR 02/21/2022 1340  ? LABSPEC 1.010 02/21/2022 1340  ? LABSPEC 1.005 03/18/2017 1315  ? PHURINE 5.0 02/21/2022 1340  ? GLUCOSEU NEGATIVE 02/21/2022 1340  ? GLUCOSEU Negative 03/18/2017 1315  ? HGBUR SMALL (A) 02/21/2022 1340  ? [place] NEGATIVE 02/21/2022 1340  ? BILIRUBINUR Negative 03/18/2017 1315  ? [place] NEGATIVE 02/21/2022 1340  ? PROTEINUR NEGATIVE 02/21/2022 1340  ? UROBILINOGEN 0.2 03/18/2017 1315  ? NITRITE NEGATIVE 02/21/2022 1340  ? LEUKOCYTESUR NEGATIVE 02/21/2022 1340  ? LEUKOCYTESUR Negative 03/18/2017 1315  ? ?Recent Results (from the past 240 hour(s))  ?Urine Culture     Status: Abnormal  ? Collection Time: 02/16/22  3:23 PM  ? Specimen: Urine, Clean Catch  ?Result Value Ref Range Status  ? Specimen Description URINE, CLEAN CATCH  Final  ? Special Requests   Final  ?  NONE ?Performed at [facility], [place] [address] [place], [place] [address] ?  ? Culture >=100,000 COLONIES/mL ESCHERICHIA COLI (A)  Final  ? Report Status 02/18/2022 FINAL  Final  ? Organism ID, Bacteria ESCHERICHIA COLI (A)  Final  ?    Susceptibility  ? Escherichia coli - MIC*  ?  AMPICILLIN 4 SENSITIVE Sensitive   ?  CEFAZOLIN <=4 SENSITIVE Sensitive   ?  CEFEPIME <=0.12 SENSITIVE Sensitive   ?  CEFTRIAXONE <=0.25 SENSITIVE  Sensitive   ?  CIPROFLOXACIN <=0.25 SENSITIVE Sensitive   ?  GENTAMICIN <=1 SENSITIVE Sensitive   ?  IMIPENEM <=0.25 SENSITIVE Sensitive   ?  NITROFURANTOIN <=16 SENSITIVE Sensitive   ?  TRIMETH/SULFA <=20 SENSITIVE Sensitive   ?  AMPICILLIN/SULBACTAM <=2 SENSITIVE Sensitive   ?  PIP/TAZO <=4 SENSITIVE Sensitive   ?  * >=100,000 COLONIES/mL ESCHERICHIA COLI  ?Expectorated Sputum Assessment w Gram Stain, Rflx to Resp Cult     Status: None (Preliminary result)  ? Collection Time: 02/24/22 10:51 PM  ? Specimen: Expectorated Sputum  ?Result Value Ref Range Status  ? Specimen Description EXPECTORATED SPUTUM  Final  ? Special Requests NONE  Final  ? Sputum evaluation   Final  ?  THIS SPECIMEN IS ACCEPTABLE FOR SPUTUM CULTURE ?Performed at [facility], [address] [place], [place] [address] ?  ? Report Status PENDING  Incomplete  ?Culture, Respiratory w Gram Stain     Status: None (Preliminary result)  ? Collection Time: 02/24/22 10:51 PM  ?Result Value Ref Range Status  ? Specimen Description EXPECTORATED SPUTUM  Final  ? Special Requests NONE Reflexed from [id number]  Final  ? Gram Stain   Final  ?  FEW SQUAMOUS EPITHELIAL CELLS PRESENT ?FEW WBC PRESENT, PREDOMINANTLY MONONUCLEAR ?FEW GRAM POSITIVE COCCI IN PAIRS ?FEW GRAM VARIABLE ROD ?RARE YEAST ?  ? Culture   Final  ?  TOO YOUNG TO READ ?Performed at [facility], [place] [address], [place], [place] [address] ?  ? Report Status PENDING  Incomplete  ?   ?DG Chest 2 View ? ?Result Date: 02/24/2022 ?CLINICAL DATA:  Dyspnea. Scattered respiratory crackles of right lung. EXAM: CHEST - 2 VIEW COMPARISON:  02/23/2022 FINDINGS: Cardiac silhouette is again moderately enlarged. Mediastinal contours are grossly within normal limits. Right chest wall porta catheter tip overlies the superior vena cava. Mildly improved aeration compared to yesterday 02/23/2022. Persistent mild bilateral interstitial thickening. No definite pleural effusion. No pneumothorax. Status post  reverse total left shoulder arthroplasty. IMPRESSION:: IMPRESSION: 1. Unchanged cardiomegaly. 2. Improved aeration of the lungs compared to yesterday's 02/23/2022 radiograph. Minimal interstitial thickening sug

## 2022-02-26 ENCOUNTER — Inpatient Hospital Stay (HOSPITAL_COMMUNITY): Payer: No Typology Code available for payment source

## 2022-02-26 DIAGNOSIS — C8238 Follicular lymphoma grade IIIa, lymph nodes of multiple sites: Secondary | ICD-10-CM | POA: Diagnosis not present

## 2022-02-26 DIAGNOSIS — I482 Chronic atrial fibrillation, unspecified: Secondary | ICD-10-CM | POA: Diagnosis not present

## 2022-02-26 DIAGNOSIS — I5033 Acute on chronic diastolic (congestive) heart failure: Secondary | ICD-10-CM | POA: Diagnosis not present

## 2022-02-26 DIAGNOSIS — I4891 Unspecified atrial fibrillation: Secondary | ICD-10-CM | POA: Diagnosis not present

## 2022-02-26 LAB — BASIC METABOLIC PANEL
Anion gap: 13 (ref 5–15)
BUN: 24 mg/dL — ABNORMAL HIGH (ref 8–23)
CO2: 26 mmol/L (ref 22–32)
Calcium: 8.1 mg/dL — ABNORMAL LOW (ref 8.9–10.3)
Chloride: 91 mmol/L — ABNORMAL LOW (ref 98–111)
Creatinine, Ser: 1.39 mg/dL — ABNORMAL HIGH (ref 0.44–1.00)
GFR, Estimated: 39 mL/min — ABNORMAL LOW (ref >60)
Glucose, Bld: 261 mg/dL — ABNORMAL HIGH (ref 70–99)
Potassium: 3.6 mmol/L (ref 3.5–5.1)
Sodium: 130 mmol/L — ABNORMAL LOW (ref 135–145)

## 2022-02-26 LAB — MAGNESIUM: Magnesium: 1.5 mg/dL — ABNORMAL LOW (ref 1.7–2.4)

## 2022-02-26 MED ORDER — MAGNESIUM SULFATE 4 GM/100ML IV SOLN
4.0000 g | Freq: Once | INTRAVENOUS | Status: AC
  Administered 2022-02-26: 4 g via INTRAVENOUS
  Filled 2022-02-26: qty 100

## 2022-02-26 MED ORDER — MAGNESIUM SULFATE 2 GM/50ML IV SOLN
2.0000 g | Freq: Once | INTRAVENOUS | Status: DC
Start: 2022-02-26 — End: 2022-02-26

## 2022-02-26 MED ORDER — POTASSIUM CHLORIDE 20 MEQ PO PACK
60.0000 meq | PACK | Freq: Once | ORAL | Status: AC
  Administered 2022-02-26: 60 meq via ORAL
  Filled 2022-02-26: qty 3

## 2022-02-26 MED ORDER — POTASSIUM CHLORIDE CRYS ER 20 MEQ PO TBCR: 60.0000 meq | EXTENDED_RELEASE_TABLET | Freq: Once | ORAL | Status: DC

## 2022-02-26 MED ORDER — FUROSEMIDE 10 MG/ML IJ SOLN
20.0000 mg | Freq: Two times a day (BID) | INTRAMUSCULAR | Status: DC
  Administered 2022-02-26: 20 mg via INTRAVENOUS
  Filled 2022-02-26: qty 2

## 2022-02-26 NOTE — Plan of Care (Signed)
  Problem: Activity: Goal: Ability to tolerate increased activity will improve Outcome: Progressing   

## 2022-02-26 NOTE — TOC Initial Note (Addendum)
Transition of Care (TOC) - Initial/Assessment Note  ? ? ?Patient Details  ?Name: Carrie [name] ?MRN: [id number] ?Date of Birth: April 02, 1941 ? ?Transition of Care (TOC) CM/SW Contact:    ?Graves-[place]elow, Ocie Cornfield, RN ?Phone Number: ?02/26/2022, 10:33 AM ? ?Clinical Narrative: Case Manager spoke with patient regarding disposition needs. Patient is agreeable to home health services in the home. Patient states she has a friend [name] that will be staying with her for intermittent supervision. Referral for home health services submitted to [facility] Liaison can accept the patient. Patient will need an order for RN,PT,OT, SW, and Aide with [employer]2F. [place] with [place] to speak with the [facility] for homemaker aide services. Patient states she has a rolling walker, bedside commode, and shower chair in the home. Patient may benefit from oxygen. PT to ambulate the patient for oxygen needs. Case Manager will need to submit the order to [facility] by noon if the patient needs oxygen. Order will then be submitted to [facility] in [place] and they will have to deliver the DME to the patient's home if possible and family to bring to the hospital. Patient states she will have transportation home. No further needs from Case Manager at this time.  ? ?02-26-22 1452 Case manager faxed oxygen orders to the [facility]- @ [phone] orders had to be in by 1500. Not sure if this will be processed today. Weekends may need to follow up for delivery. Case Manager did call [place] with the [facility] and no answer. Case Manager called the oxygen department at the [facility] and had to leave a voicemail (inquiring about weekend delivery). Case Manager will continue to follow for additional transition of care needs.  ? ?[place] [facility] Oxygen department [phone]- e-mail for the weekend is [email or web address] ,gov to see if the oxygen can be ordered and delivered.  ? ?Expected Discharge Plan: [place] ?Barriers to Discharge: Continued Medical Work up ? ? ?Patient Goals and CMS Choice ?Patient states their goals for this hospitalization and ongoing recovery are:: Patient is agreeable to return home with home health ?CMS Medicare.gov Compare Post Acute Care list provided to:: Patient ?Choice offered to / list presented to : Patient ? ?Expected Discharge Plan and Services ?Expected Discharge Plan: [place] ?In-house Referral: NA ?Discharge Planning Services: CM Consult ?Post Acute Care Choice: Home Health ?Living arrangements for the past 2 months: [place] ?                ?DME Arranged:  (following for oxygen) ?DME Agency: NA ?  ?  ?  ?HH Arranged: Disease Management, RN, PT, Nurse's Aide, OT, Social Work ?[place] Agency: [facility]) ?Date HH Agency Contacted: 02/26/22 ?Time [place]: 1030 ?Representative spoke with at [place]: [name] ? ?Prior Living Arrangements/Services ?Living arrangements for the past 2 months: [place] ?Lives with:: Self (Patient has a friend [name] that will be staying with her for a while.) ?Patient language and need for interpreter reviewed:: Yes ?Do you feel safe going back to the place where you live?: Yes      ?Need for Family Participation in Patient Care: Yes (Comment) ?Care giver support system in place?: Yes (comment) ?Current home services: DME (Patient has a rolling walker.) ?Criminal Activity/Legal Involvement Pertinent to Current Situation/Hospitalization: No - Comment as needed ? ?Activities of Daily Living ?Home Assistive Devices/Equipment: None ?ADL Screening (condition at time of admission) ?Patient's cognitive ability adequate to safely  complete daily activities?: Yes ?Is the patient deaf or have difficulty hearing?: No ?Does the patient have difficulty seeing, even when wearing glasses/contacts?: No ?Does the patient have difficulty concentrating, remembering, or making decisions?: No ?Patient able  to express need for assistance with ADLs?: Yes ?Does the patient have difficulty dressing or bathing?: No ?Independently performs ADLs?: Yes (appropriate for developmental age) ?Does the patient have difficulty walking or climbing stairs?: No ?Weakness of Legs: None ?Weakness of Arms/Hands: None ? ?Permission Sought/Granted ?Permission sought to share information with : Case Manager ?Permission granted to share information with : Yes, Verbal Permission Granted ?   ? Permission granted to share info w AGENCY: [place] ?   ?   ? ?Emotional Assessment ?Appearance:: Appears stated age ?Attitude/Demeanor/Rapport: Engaged ?Affect (typically observed): Appropriate ?Orientation: : Oriented to Self, Oriented to Place, Oriented to  Time, Oriented to Situation ?Alcohol / Substance Use: Not Applicable ?Psych Involvement: No (comment) ? ?Admission diagnosis:  A-fib ([place]) [I48.91] ?Atrial fibrillation with RVR ([place]) [I48.91] ?Patient Active Problem List  ? Diagnosis Date Noted  ? A-fib ([place]) 02/21/2022  ? Diarrhea 02/17/2022  ? Asymptomatic bacteriuria 02/17/2022  ? Chronic anemia 02/17/2022  ? Head trauma 02/02/2022  ? HFimpEF (Heart Failure with improved EF) 02/01/2022  ? Coronary artery disease involving native coronary artery of native heart without angina pectoris 02/01/2022  ? Acute renal failure superimposed on stage 3a chronic kidney disease ([place]) 01/23/2022  ? Chronic atrial fibrillation with RVR ([place]) 01/22/2022  ? Atrial fibrillation ([place]) 01/21/2022  ? Atrial fibrillation with RVR ([place]) 01/21/2022  ? Aortic atherosclerosis ([place]) 01/10/2022  ? (HFimpEF) heart failure with improved ejection fraction ([place]) 12/30/2021  ? Headache 12/30/2021  ? Acute otitis media 10/27/2021  ? Acquired hypercoagulable state ([place]) 09/22/2021  ? Influenza A 09/15/2021  ? Cough in adult 09/09/2021  ? H/O total shoulder replacement, left 03/02/2021  ? Deficiency anemia 02/03/2021  ? Pulmonary hypertension ([place]) 10/27/2020  ?  Thrombocytopenia ([place]) 09/01/2020  ? Goals of care, counseling/discussion 07/23/2020  ? Leukopenia 09/25/2019  ? Syncope 03/05/2019  ? Postural dizziness with presyncope 03/04/2019  ? Atypical chest pain   ? Near syncope   ? Skin lesions 06/06/2017  ? Shortness of breath 03/25/2017  ? Anxiety disorder 03/25/2017  ? Generalized weakness 03/19/2017  ? Insomnia disorder 03/19/2017  ? Dysuria 08/31/2016  ? Acute back pain with sciatica, right 08/31/2016  ? Chronic back pain 07/02/2016  ? Quality of life palliative care encounter 03/02/2016  ? Port catheter in place 03/01/2016  ? Skin rash 11/03/2015  ? Hypokalemia 07/02/2015  ? Essential hypertension 07/02/2015  ? 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 Stage 3a chronic kidney disease ([place]) 06/25/2015  ? Chronic knee pain after total replacement of right knee joint 04/09/2015  ? Chronic left shoulder pain 03/31/2015  ? Other fatigue 03/07/2015  ? Grade 3a follicular lymphoma of lymph nodes of multiple regions ([place]) 02/03/2015  ? Hypothyroidism 09/09/2009  ? ?PCP:  [facility], [name] ?Pharmacy:   ?[facility], [address] ?[phone] ?[place] [address] ?Phone: [phone] Fax: [phone] ? ?[id number] [place] [facility] [address] ?[id number]?Phone: [phone]: [phone]? ? ?Readmission Risk Interventions ? ?  02/26/2022  ? 10:30 AM 02/22/2022  ?  1:48 PM  ?Readmission Risk Prevention Plan  ?Transportation Screening Complete Complete  ?Medication Review (Press photographer Complete Complete  ?  PCP or Specialist appointment within 3-5 days of discharge Complete Complete  ?[place] or Home Care Consult Complete Complete  ?SW Recovery Care/Counseling Consult Complete Complete  ?Palliative Care Screening Not Applicable Not Applicable  ?[place] Not Applicable Not Applicable  ? ? ? ?

## 2022-02-26 NOTE — Progress Notes (Addendum)
? ?Progress Note ? ?Patient Name: Carrie [name] ?Date of Encounter: 02/26/2022 ? ?[facility] Cardiologist: [name], MD  ? ?Subjective  ? ?Tired, woken a few times, otherwise ok ? ?Inpatient Medications  ?  ?Scheduled Meds: ? amiodarone  200 mg Oral BID  ? apixaban  5 mg Oral BID  ? Chlorhexidine Gluconate Cloth  6 each Topical Daily  ? doxycycline  100 mg Oral Q12H  ? furosemide  20 mg Intravenous BID  ? levothyroxine  88 mcg Oral QAC breakfast  ? metoprolol tartrate  75 mg Oral BID  ? sodium chloride flush  3 mL Intravenous Q12H  ? ?Continuous Infusions: ? sodium chloride    ? cefTRIAXone (ROCEPHIN)  IV 1 g (02/25/22 1540)  ? magnesium sulfate bolus IVPB 4 g (02/26/22 1444)  ? ?PRN Meds: ?sodium chloride, acetaminophen, ondansetron (ZOFRAN) IV, sodium chloride flush, sodium chloride flush  ? ?Vital Signs  ?  ?Vitals:  ? 02/25/22 1956 02/25/22 2048 02/26/22 0605 02/26/22 1419  ?BP: 120/76  (!) 135/98   ?Pulse: 93 (!) 102 71   ?Resp:      ?Temp: 98.3 ?F (36.8 ?C)  98.6 ?F (37 ?C)   ?TempSrc: Oral  Oral   ?SpO2: 92%  94% 93%  ?Weight:   62.5 kg   ?Height:      ? ? ?Intake/Output Summary (Last 24 hours) at 02/26/2022 1526 ?Last data filed at 02/26/2022 0610 ?Gross per 24 hour  ?Intake 360 ml  ?Output 250 ml  ?Net 110 ml  ? ? ?  02/26/2022  ?  6:05 AM 02/25/2022  ?  4:40 AM 02/24/2022  ?  4:03 AM  ?Last 3 Weights  ?Weight (lbs) 137 lb 12.6 oz 134 lb 8 oz 134 lb 14.7 oz  ?Weight (kg) 62.5 kg 61.009 kg 61.2 kg  ?   ? ?Telemetry  ?  ?AFib 70's - 100's,  Personally Reviewed ? ?ECG  ?  ?No new EKGs - Personally Reviewed ? ?Physical Exam  ? ?GEN: No acute distress.   ?Neck: No JVD ?Cardiac: irreg-irreg, no murmurs, rubs, or gallops.  ?Respiratory: Clear to auscultation bilaterally. ?GI: Soft, nontender, non-distended  ?MS: No edema; No deformity. ?Neuro:  Nonfocal  ?Psych: Normal affect  ? ?Labs  ?  ?High Sensitivity Troponin:   ?Recent Labs  ?Lab 02/16/22 ?1441 02/16/22 ?1659 02/21/22 ?1338 02/21/22 ?1657   ?TROPONINIHS 49* 47* 38* 46*  ?   ?Chemistry ?Recent Labs  ?Lab 02/21/22 ?1338 02/21/22 ?1657 02/22/22 ?0128 02/23/22 ?0202 02/24/22 ?4496 02/25/22 ?0257 02/26/22 ?1135  ?NA 131*  --  133*   < > 131* 130* 130*  ?K 5.1  --  2.9*   < > 3.1* 4.2 3.6  ?CL 97*  --  95*   < > 91* 96* 91*  ?CO2 24  --  27   < > '29 28 26  '$ ?GLUCOSE 216*  --  140*   < > 124* 189* 261*  ?BUN 15  --  14   < > 21 23 24*  ?CREATININE 1.22*  --  1.23*   < > 1.31* 1.38* 1.39*  ?CALCIUM 7.4*  --  8.0*   < > 8.1* 8.0* 8.1*  ?MG  --  1.3* 2.8*  --   --   --  1.5*  ?PROT 4.6*  --  5.5*  --  5.3*  --   --   ?ALBUMIN 2.2*  --  3.2*  --  3.0*  --   --   ?  AST 92*  --  72*  --  59*  --   --   ?ALT 56*  --  54*  --  56*  --   --   ?ALKPHOS 104  --  131*  --  175*  --   --   ?BILITOT 2.0*  --  1.0  --  1.3*  --   --   ?GFRNONAA 45*  --  45*   < > 41* 39* 39*  ?ANIONGAP 10  --  11   < > '11 6 13  '$ ? < > = values in this interval not displayed.  ?  ?Lipids No results for input(s): CHOL, TRIG, HDL, LABVLDL, LDLCALC, CHOLHDL in the last 168 hours.  ?Hematology ?Recent Labs  ?Lab 02/21/22 ?1338 02/24/22 ?4010 02/25/22 ?0257  ?WBC 1.5* 1.6* 1.8*  ?RBC 3.44* 3.03* 3.15*  ?HGB 9.9* 8.7* 9.1*  ?HCT 31.0* 26.9* 27.9*  ?MCV 90.1 88.8 88.6  ?MCH 28.8 28.7 28.9  ?MCHC 31.9 32.3 32.6  ?RDW 14.5 14.7 14.7  ?PLT 292 258 278  ? ?Thyroid  ?No results for input(s): TSH, FREET4 in the last 168 hours. ?  ?BNP ?Recent Labs  ?Lab 02/21/22 ?1338 02/24/22 ?1045  ?BNP 956.5* 3,448.1*  ?  ?DDimer No results for input(s): DDIMER in the last 168 hours.  ? ?Radiology  ?  ?DG Chest 2 View ? ?Result Date: 02/24/2022 ?CLINICAL DATA:  Dyspnea. Scattered respiratory crackles of right lung. EXAM: CHEST - 2 VIEW COMPARISON:  02/23/2022 FINDINGS: Cardiac silhouette is again moderately enlarged. Mediastinal contours are grossly within normal limits. Right chest wall porta catheter tip overlies the superior vena cava. Mildly improved aeration compared to yesterday 02/23/2022. Persistent mild  bilateral interstitial thickening. No definite pleural effusion. No pneumothorax. Status post reverse total left shoulder arthroplasty. IMPRESSION:: IMPRESSION: 1. Unchanged cardiomegaly. 2. Improved aeration of the lungs compared to yesterday's 02/23/2022 radiograph. Minimal interstitial thickening suggesting possible minimal residual interstitial pulmonary edema. Electronically Signed   By: Yvonne Kendall M.D.   On: 02/24/2022 17:13   ? ?Cardiac Studies  ? ?R/L Cardiac catheterization 01/15/2022 ?RCA proximal 15 ?PCWP 11, mean PA 21, Fick CO 6.09 ?EF 50-55 ?  ?Echocardiogram 12/31/21 ?Inf HK, EF 50-55, normal PASP 38, normal RVSF, mild LAE, mild MR, mild to mod TR, mild AI, AV sclerosis w/o AS ?  ?Echocardiogram 10/31/2020 ?EF 55-60, GR 1 DD, GLS -17.9% (normal), normal RVSF, moderate LAE, mild-moderate MR, mild AI ?  ?Echocardiogram 05/24/2019 ?EF 55-60, GRII DD, normal RVSF, mild LAE, mild MR, trivial AI, PASP 28 ?  ?Echocardiogram 02/25/2019 ?EF 20-25, severely reduced RVSF, RVSP 23.5, mild BAE, trivial pericardial effusion, moderate MR, trivial AI ?  ?Echocardiogram 10/15/2015 ?EF 60-65, normal wall motion, severe LAE ?  ?Echo 06/26/15 ?Inferobasal AK, EF 55%, mild AI, mild MR, mild LAE ?  ?Echo 4/16 ?EF 55-60%, normal wall motion, grade 2 diastolic dysfunction, trivial AI, mild MR, moderate LAE, mild TR, trivial PI, PASP 33 mmHg ?  ?Myoview 11/10 ?Normal, EF 66% ?  ?Carotid US 6/05 ?No ICA stenosis ? ?Patient Profile  ?   ?81 y.o. female with a hx of follicular lymphoma grade III, post chemo,  on rituximab, hx of NICM w/recvered LVEF with SR,  non-obstructive CAD (cath 12/2021), hypothyroidism, CKD (IIIb) and AFib ? ? ?AFib Hx ?Diagnosed 2016 in setting of urosepsis ?Flecainide, presumably stopped 2/2 development of CM ?Amiodarone 2020 stopped with maintenance of SR restarted March 2023 ?There is mention of hypotension with dilt  ? ? ?  Admitted with reports of fast HRs, found in AFib w/RVR ?PO amio >> gtt and lopressor  started ?EP consulted to case, given no patient perceived symptoms with her AFib, planned for rate control strategy  ?Rate control achieved with PO amiodarone and lopressor and EP signed off 02/24/22 ? ?EP recalled to case today with functional decline, progressive weakness, DOE with de-sats on ambulation with concerns if AFib is playing a role despite rate control ? ?Assessment & Plan  ?  ?Paroxysmal Afib ?CHA2DS2Vasc is 4, on Eliquis with no missed doses ?S/p DCCV 02/18/22 with ERAF ?With no appreciable symptoms 2/2 the AFib planned for rate control ? ? ?LABS today ?K+ 3.6 ?BUN/Creat 24/1.39 ?Mag 1.5 ?WBC 1.8 (waxes/wanes, but of late appears more chronically low) ?H/H 9.1/27.9, (HGB was 12 in April) ?Plts 278 ? ?5/10: BNP 3448, getting IV lasix > CXR with improved aeration, minimal interstitial thickening, residual pulmonary edema ?Empirically treated with abx given low WBC and PCT 0.31 ? ?Desats on RA with ambulation ? ?She has crackles at the bases. ?Not spending much time OOB ?Will recheck her CXR, her lasix has been up-titrated already ?I think a couple back to back hospitalizations and time in bed contributes ?AFib could be contributing to some reduction in exertional capacity though again in out discussion today, she denies any symptoms coming into the hospital when in AFib w/RVR ?That being said,  ?In d/w Dr. Quentin Ore ?She is not a PACE/ablate candidate with port, and her HR is controlled, this would not make any difference. ?She is not a candidate for AFib ablation ?She had been on amio a couple months when DCCV and had ERAF, would not re-visit this  ? ?Encourage OOB, PT ?Lasix today and follow ?Incentive spirometry ?Check LFTs ? ?Dr. Quentin Ore will see her later today, we will reach out to her daughter after he has ad an opportunity to see her. ? ?ADDEND: ?Dr. Quentin Ore has seen the patient, agrees with above. ?I have spoken to her daughter-in-law Janett Billow at the patient's request, she is an Therapist, sports with strong  medical knowledge and knows the patient well. ?She has some suspicions of perhaps some non-compliance with medicines at home, particularly diuretics with urinary frequency/urgency from it. ?Reports since Oct or so last y

## 2022-02-26 NOTE — Progress Notes (Signed)
?Progress Note ? ?Patient: Carrie [name]:[id number] DOB: June 22, 1941  ?DOA: 02/21/2022  DOS: 02/26/2022  ?  ?Brief hospital course: ?Carrie [name] is a 81 y.o. female with a history of PAF s/p DCCV 5/3 with ERAF, chronic HFpEF, Follicular lymphoma stage IIIa s/p chemotherapy with pancytopenia, hypothyroidism, HTN, HLD, stage IIIb CKD, and chronic cough since recovering from covid-19 infection early 2023 who presented to the ED 5/7 with shortness of breath, palpitations, lightheadedness found to be afebrile with AFib with RVR. This improved with amiodarone infusion and upward titration of beta blocker overseen by EP consultant ? ?Assessment and Plan: ?Acute hypoxic respiratory failure: Note PASP 38 without morphologic features of pulmonary HTN. Remains hypoxic with ambulation.  ?- We've continued diuresis though she's still hypoxic with crackles. Will augment diuresis to BID, monitor I/O, weights (relatively stable), and BMP ?- Repeat CXR in AM.  ?- PCT 0.31. With low WBC, cough, will complete 5 days typical abx. (Doxy instead of azithro given rhythm issues). Sputum culture TYTR/reincubated. ?- Also suspect element of deconditioning with weakness and cough since she had covid-19 (a common "long covid" pattern in elderly patients in my experience). Maximize antitussives. Suggest she consider rehabilitation due to significant weakness and living alone. Will continue to have therapy consult. We are working diligently to mobilize the patient. Maximal home health is being arranged. I have shared my fear with her and her DIL that she may not fly at home no matter how medically ready she is given her weakness. That said, we'll need to know she's got dry lungs before discharge.  ? ?Hypomagnesemia:  ?- Supplement aggressively given her persistent AFib with K supp as we've augmented lasix as well.  ? ?PAF: s/p DCCV 02/18/2022 with ERAF. Asymptomatic. Dx 2016. Note hx of hypotension with diltiazem and discontinuation of  flecainide in the past, presumably due to CM.  ?- Followed by EP here: Continue metoprolol '75mg'$  BID ?- Continue amiodarone '200mg'$  BID until AFib clinic follow up arranged 5/16 at 10:00am.  ?- Keep K and Mg replete, doing so as above ?- DIL curious as to whether pt's fatigue could have persistent AFib as a contribution. She's failed DCCV and is not a candidate for other therapies per my discussion with EP. I've asked them to reengage to opine on any further optimization they can offer. I truly appreciate their help with this patient and her DIL.  ? ?Stage IIIa follicular lymphoma: Treated at [place].  ?- Continue oncology follow up. Discussed with Dr. Alvy Bimler who reports last scan showed NED, advises against GCSF at this time. ? ?Leukopenia and anemia: Chronic. ANC < 1500. No bleeding. Afebrile. Hgb has stabilized ~9g/dl. ?- This is stable and can be monitored at follow up. ? ?Stage IIIb CKD:  ?- Keep strict I/O. Minimize nephrotoxins. Needs diuresis.  ? ?LFT elevation: Overall improving. No cirrhosis by U/S. ?- Recheck LFTs in AM. ? ?Subjective: Still feels crummy. Some shortness of breath limiting exertion but feels tired and worn out all the time. No chest pain or palpitations. Still some cough. Hasn't been getting up very often, but still desaturates when we are able to walk with her. She walked into the hall yesterday, up to chair today.  ? ?Objective: ?Vitals:  ? 02/25/22 2048 02/26/22 0605 02/26/22 1419 02/26/22 1601  ?BP:  (!) 135/98  113/66  ?Pulse: (!) 102 71  (!) 57  ?Resp:    20  ?Temp:  98.6 ?F (37 ?C)  98.4 ?F (36.9 ?C)  ?TempSrc:  Oral  Oral  ?SpO2:  94% 93% 98%  ?Weight:  62.5 kg    ?Height:      ? ?Gen: 81 y.o. female in no distress ?Pulm: Nonlabored, 87-95% on room air at rest without exertion. Crackles prominent at bases. ?CV: Irreg irreg, rate in 80's without murmur, rub, or gallop. No JVD, no pitting dependent edema. ?GI: Abdomen soft, non-tender, non-distended, with normoactive bowel sounds.  ?Ext:  Warm, no deformities ?Skin: No new rashes, lesions or ulcers on visualized skin. ?Neuro: Alert and oriented. No focal neurological deficits. ?Psych: Judgement and insight appear fair. Mood euthymic & affect congruent. Behavior is appropriate.   ? ?Data Personally reviewed: ? ?CBC: ?Recent Labs  ?Lab 02/21/22 ?1338 02/24/22 ?0938 02/25/22 ?0257  ?WBC 1.5* 1.6* 1.8*  ?NEUTROABS 1.0* 1.1* 1.5*  ?HGB 9.9* 8.7* 9.1*  ?HCT 31.0* 26.9* 27.9*  ?MCV 90.1 88.8 88.6  ?PLT 292 258 278  ? ?Basic Metabolic Panel: ?Recent Labs  ?Lab 02/21/22 ?1657 02/22/22 ?0128 02/23/22 ?0202 02/24/22 ?1829 02/25/22 ?0257 02/26/22 ?1135  ?NA  --  133* 131* 131* 130* 130*  ?K  --  2.9* 3.9 3.1* 4.2 3.6  ?CL  --  95* 95* 91* 96* 91*  ?CO2  --  '27 24 29 28 26  '$ ?GLUCOSE  --  140* 157* 124* 189* 261*  ?BUN  --  '14 15 21 23 '$ 24*  ?CREATININE  --  1.23* 1.27* 1.31* 1.38* 1.39*  ?CALCIUM  --  8.0* 8.3* 8.1* 8.0* 8.1*  ?MG 1.3* 2.8*  --   --   --  1.5*  ? ?GFR: ?Estimated Creatinine Clearance: 27.8 mL/min (A) (by C-G formula based on SCr of 1.39 mg/dL (H)). ?Liver Function Tests: ?Recent Labs  ?Lab 02/21/22 ?1338 02/22/22 ?0128 02/24/22 ?9371  ?AST 92* 72* 59*  ?ALT 56* 54* 56*  ?ALKPHOS 104 131* 175*  ?BILITOT 2.0* 1.0 1.3*  ?PROT 4.6* 5.5* 5.3*  ?ALBUMIN 2.2* 3.2* 3.0*  ? ?Urine analysis: ?   ?Component Value Date/Time  ? COLORURINE YELLOW 02/21/2022 1340  ? APPEARANCEUR CLEAR 02/21/2022 1340  ? LABSPEC 1.010 02/21/2022 1340  ? LABSPEC 1.005 03/18/2017 1315  ? PHURINE 5.0 02/21/2022 1340  ? GLUCOSEU NEGATIVE 02/21/2022 1340  ? GLUCOSEU Negative 03/18/2017 1315  ? HGBUR SMALL (A) 02/21/2022 1340  ? [place] NEGATIVE 02/21/2022 1340  ? BILIRUBINUR Negative 03/18/2017 1315  ? [place] NEGATIVE 02/21/2022 1340  ? PROTEINUR NEGATIVE 02/21/2022 1340  ? UROBILINOGEN 0.2 03/18/2017 1315  ? NITRITE NEGATIVE 02/21/2022 1340  ? LEUKOCYTESUR NEGATIVE 02/21/2022 1340  ? LEUKOCYTESUR Negative 03/18/2017 1315  ? ?Recent Results (from the past 240 hour(s))   ?Expectorated Sputum Assessment w Gram Stain, Rflx to Resp Cult     Status: None (Preliminary result)  ? Collection Time: 02/24/22 10:51 PM  ? Specimen: Expectorated Sputum  ?Result Value Ref Range Status  ? Specimen Description EXPECTORATED SPUTUM  Final  ? Special Requests NONE  Final  ? Sputum evaluation   Final  ?  THIS SPECIMEN IS ACCEPTABLE FOR SPUTUM CULTURE ?Performed at [facility], [place] [address] [place], [place] [address] ?  ? Report Status PENDING  Incomplete  ?Culture, Respiratory w Gram Stain     Status: None (Preliminary result)  ? Collection Time: 02/24/22 10:51 PM  ?Result Value Ref Range Status  ? Specimen Description EXPECTORATED SPUTUM  Final  ? Special Requests NONE Reflexed from [id number]  Final  ? Gram Stain   Final  ?  FEW SQUAMOUS EPITHELIAL CELLS  PRESENT ?FEW WBC PRESENT, PREDOMINANTLY MONONUCLEAR ?FEW GRAM POSITIVE COCCI IN PAIRS ?FEW GRAM VARIABLE ROD ?RARE YEAST ?  ? Culture   Final  ?  CULTURE REINCUBATED FOR BETTER GROWTH ?Performed at [facility], [place] [address], [place], [place] [address] ?  ? Report Status PENDING  Incomplete  ?   ? ? ?Family Communication: None at bedside. Daughter-in-law by speaker phone at bedside ? ?Disposition: ?Status is: Inpatient ?Remains inpatient appropriate because: Persistent acute hypoxic respiratory failure requiring work up and management ?Planned Discharge Destination: Home with maximal Home Health ? ?Patrecia Pour, MD ?02/26/2022 4:55 PM ?Page by [email or web address]  ?

## 2022-02-26 NOTE — Progress Notes (Signed)
Physical Therapy Treatment ?Patient Details ?Name: Carrie [name] ?MRN: [id number] ?DOB: [birth year] ?Today's Date: 02/26/2022 ? ? ?History of Present Illness 81 y/o female admitted 5/7 with SOB and lightheadedness due to Afib. Admit 5/2-5/4 for same. PMHx:non-hodgkins lymphoma, Afib s/p cardioversion, CHF, HTN, CKD, chronic cough post Covid. ? ?  ?PT Comments  ? ? Pt pleasant with SPO2 95% on 2L with activity and 94% with trial at rest on RA with HR 90-105. Pt denied ambulation or increased functional activity beyond getting to chair and HEP. Pt unable to confirm at this time if Carrie [name] is actually going to be able to stay and assist her at home. Pt currently not performing functional mobility conducive to home alone. Educated pt for need to increased mobility for home or ST-SNf may be warranted and she voiced understanding.  ? ?  ?Recommendations for follow up therapy are one component of a multi-disciplinary discharge planning process, led by the attending physician.  Recommendations may be updated based on patient status, additional functional criteria and insurance authorization. ? ?Follow Up Recommendations ? Home health PT (SNF if additional help at home cannot be confirmed) ?  ?  ?Assistance Recommended at Discharge Frequent or constant Supervision/Assistance  ?Patient can return home with the following A little help with walking and/or transfers;A little help with bathing/dressing/bathroom;Help with stairs or ramp for entrance;Assistance with cooking/housework ?  ?Equipment Recommendations ? None recommended by PT  ?  ?Recommendations for Other Services   ? ? ?  ?Precautions / Restrictions Precautions ?Precautions: Fall ?Precaution Comments: watch HR  ?  ? ?Mobility ? Bed Mobility ?Overal bed mobility: Modified Independent ?Bed Mobility: Supine to Sit ?  ?  ?Supine to sit: Modified independent (Device/Increase time) ?  ?  ?General bed mobility comments: pt able to transition to sitting without assist ?   ? ?Transfers ?Overall transfer level: Needs assistance ?  ?Transfers: Sit to/from Stand ?Sit to Stand: Min guard ?  ?  ?  ?  ?  ?General transfer comment: guarding to rise from bed and cues for hand placement. Reliance on UE use with pt able to repeat 10 total sit to stand with rest between and increased time ?  ? ?Ambulation/Gait ?Ambulation/Gait assistance: Min guard ?Gait Distance (Feet): 15 Feet ?Assistive device: None ?Gait Pattern/deviations: Step-through pattern, Decreased stride length ?  ?Gait velocity interpretation: <1.8 ft/sec, indicate of risk for recurrent falls ?  ?General Gait Details: pt walked around bed to chair but denied in room or hall ambulation due to fatigue despite education for need to progress mobility for home ? ? ?Stairs ?  ?  ?  ?  ?  ? ? ?Wheelchair Mobility ?  ? ?Modified Rankin (Stroke Patients Only) ?  ? ? ?  ?Balance Overall balance assessment: Needs assistance ?Sitting-balance support: Feet supported, No upper extremity supported ?Sitting balance-Leahy Scale: Good ?Sitting balance - Comments: static sitting and donning shoes without assist ?  ?Standing balance support: During functional activity ?Standing balance-Leahy Scale: Fair ?Standing balance comment: pt able to stand and walk without physical assist with slight imbalance ?  ?  ?  ?  ?  ?  ?  ?  ?  ?  ?  ?  ? ?  ?Cognition Arousal/Alertness: Awake/alert ?Behavior During Therapy: [facility] for tasks assessed/performed ?Overall Cognitive Status: Within Functional Limits for tasks assessed ?  ?  ?  ?  ?  ?  ?  ?  ?  ?  ?  ?  ?  ?  ?  ?  ?  ?  ?  ? ?  ?  Exercises General Exercises - Lower Extremity ?Long Arc Quad: AROM, Both, Seated, 10 reps ?Toe Raises: AROM, Both, Seated, 10 reps ?Heel Raises: AROM, Seated, Both, 10 reps ? ?  ?General Comments   ?  ?  ? ?Pertinent Vitals/Pain Pain Assessment ?Pain Assessment: No/denies pain  ? ? ?Home Living   ?  ?  ?  ?  ?  ?  ?  ?  ?  ?   ?  ?Prior Function    ?  ?  ?   ? ?PT Goals (current goals  can now be found in the care plan section) Progress towards PT goals: Progressing toward goals ? ?  ?Frequency ? ? ? Min 3X/week ? ? ? ?  ?PT Plan Discharge plan needs to be updated  ? ? ?Co-evaluation   ?  ?  ?  ?  ? ?  ?AM-PAC PT "6 Clicks" Mobility   ?Outcome Measure ? Help needed turning from your back to your side while in a flat bed without using bedrails?: None ?Help needed moving from lying on your back to sitting on the side of a flat bed without using bedrails?: None ?Help needed moving to and from a bed to a chair (including a wheelchair)?: A Little ?Help needed standing up from a chair using your arms (e.g., wheelchair or bedside chair)?: A Little ?Help needed to walk in hospital room?: A Lot ?Help needed climbing 3-5 steps with a railing? : Total ?6 Click Score: 17 ? ?  ?End of Session   ?Activity Tolerance: Patient limited by fatigue ?Patient left: in chair;with call bell/phone within reach ?Nurse Communication: Mobility status ?PT Visit Diagnosis: Other abnormalities of gait and mobility (R26.89);Difficulty in walking, not elsewhere classified (R26.2) ?  ? ? ?Time: 1010-1029 ?PT Time Calculation (min) (ACUTE ONLY): 19 min ? ?Charges:  $Therapeutic Activity: 8-22 mins          ?          ? ?Donta Mcinroy P, PT ?Acute Rehabilitation Services ?Pager: [phone] ?Office: [phone] ? ? ? ?Tattiana Fakhouri B Zyrus Hetland ?02/26/2022, 1:26 PM ? ?

## 2022-02-26 NOTE — Progress Notes (Signed)
SATURATION QUALIFICATIONS: (This note is used to comply with regulatory documentation for home oxygen) ? ?Patient Saturations on Room Air at Rest = 97% ? ?Patient Saturations on Room Air while Ambulating = 86% ? ?Patient Saturations on 2 Liters of oxygen while Ambulating = 93% ? ?Please briefly explain why patient needs home oxygen: ?

## 2022-02-26 NOTE — Progress Notes (Signed)
?  Mobility Specialist Criteria Algorithm Info. ? ? ? 02/26/22 1419  ?Oxygen Therapy  ?SpO2 93 %  ?O2 Device Nasal Cannula  ?Patient Activity (if Appropriate) Ambulating  ?Mobility  ?Activity Ambulated with assistance in hallway  ?Range of Motion/Exercises Active;All extremities  ?Level of Assistance Contact guard assist, steadying assist  ?Assistive Device Other (Comment) ?(HHA)  ?Distance Ambulated (ft) 100 ft (25 x 25 x 50)  ?Activity Response Tolerated well; RN notified  ? ?Patient received in supine agreeable to participate in mobility. Ambulated in hallway min guard with slow gait. Distance limited by weakness requiring seated rest x1 and standing rest x1. Oxygen saturated 86% on RA during ambulation requiring 2LO2 to maintain >90%. C/o dizziness when SpO2 below 88%.  Returned to room without complaint or incident. Was left in bed with all needs met, call bell in reach.  ? ?02/26/2022 ?2:19 PM ? ?Carrie [name], CMS, BS EXP ?Acute Rehabilitation Services  ?ETOLN:[phone] ?Office: [phone] ? ?

## 2022-02-27 DIAGNOSIS — I482 Chronic atrial fibrillation, unspecified: Secondary | ICD-10-CM | POA: Diagnosis not present

## 2022-02-27 DIAGNOSIS — I5033 Acute on chronic diastolic (congestive) heart failure: Secondary | ICD-10-CM | POA: Diagnosis not present

## 2022-02-27 DIAGNOSIS — C8238 Follicular lymphoma grade IIIa, lymph nodes of multiple sites: Secondary | ICD-10-CM | POA: Diagnosis not present

## 2022-02-27 DIAGNOSIS — I4891 Unspecified atrial fibrillation: Secondary | ICD-10-CM | POA: Diagnosis not present

## 2022-02-27 LAB — CBC WITH DIFFERENTIAL/PLATELET
Abs Immature Granulocytes: 0 10*3/uL (ref 0.00–0.07)
Basophils Absolute: 0 10*3/uL (ref 0.0–0.1)
Basophils Relative: 0 %
Eosinophils Absolute: 0 10*3/uL (ref 0.0–0.5)
Eosinophils Relative: 0 %
HCT: 24 % — ABNORMAL LOW (ref 36.0–46.0)
Hemoglobin: 7.8 g/dL — ABNORMAL LOW (ref 12.0–15.0)
Lymphocytes Relative: 7 %
Lymphs Abs: 0.1 10*3/uL — ABNORMAL LOW (ref 0.7–4.0)
MCH: 28.5 pg (ref 26.0–34.0)
MCHC: 32.5 g/dL (ref 30.0–36.0)
MCV: 87.6 fL (ref 80.0–100.0)
Monocytes Absolute: 0.1 10*3/uL (ref 0.1–1.0)
Monocytes Relative: 5 %
Neutro Abs: 1.6 10*3/uL — ABNORMAL LOW (ref 1.7–7.7)
Neutrophils Relative %: 88 %
Platelets: 240 10*3/uL (ref 150–400)
RBC: 2.74 MIL/uL — ABNORMAL LOW (ref 3.87–5.11)
RDW: 15.1 % (ref 11.5–15.5)
WBC: 1.8 10*3/uL — ABNORMAL LOW (ref 4.0–10.5)
nRBC: 0 % (ref 0.0–0.2)
nRBC: 0 /100 WBC

## 2022-02-27 LAB — RENAL FUNCTION PANEL
Albumin: 2.7 g/dL — ABNORMAL LOW (ref 3.5–5.0)
Anion gap: 10 (ref 5–15)
BUN: 28 mg/dL — ABNORMAL HIGH (ref 8–23)
CO2: 27 mmol/L (ref 22–32)
Calcium: 7.7 mg/dL — ABNORMAL LOW (ref 8.9–10.3)
Chloride: 91 mmol/L — ABNORMAL LOW (ref 98–111)
Creatinine, Ser: 1.34 mg/dL — ABNORMAL HIGH (ref 0.44–1.00)
GFR, Estimated: 40 mL/min — ABNORMAL LOW (ref >60)
Glucose, Bld: 187 mg/dL — ABNORMAL HIGH (ref 70–99)
Phosphorus: 3.3 mg/dL (ref 2.5–4.6)
Potassium: 4.2 mmol/L (ref 3.5–5.1)
Sodium: 128 mmol/L — ABNORMAL LOW (ref 135–145)

## 2022-02-27 LAB — COMPREHENSIVE METABOLIC PANEL
ALT: 52 U/L — ABNORMAL HIGH (ref 0–44)
AST: 58 U/L — ABNORMAL HIGH (ref 15–41)
Albumin: 2.2 g/dL — ABNORMAL LOW (ref 3.5–5.0)
Alkaline Phosphatase: 117 U/L (ref 38–126)
Anion gap: 9 (ref 5–15)
BUN: 24 mg/dL — ABNORMAL HIGH (ref 8–23)
CO2: 23 mmol/L (ref 22–32)
Calcium: 6.6 mg/dL — ABNORMAL LOW (ref 8.9–10.3)
Chloride: 100 mmol/L (ref 98–111)
Creatinine, Ser: 1.09 mg/dL — ABNORMAL HIGH (ref 0.44–1.00)
GFR, Estimated: 52 mL/min — ABNORMAL LOW (ref >60)
Glucose, Bld: 135 mg/dL — ABNORMAL HIGH (ref 70–99)
Potassium: 2.7 mmol/L — CL (ref 3.5–5.1)
Sodium: 132 mmol/L — ABNORMAL LOW (ref 135–145)
Total Bilirubin: 0.5 mg/dL (ref 0.3–1.2)
Total Protein: 4 g/dL — ABNORMAL LOW (ref 6.5–8.1)

## 2022-02-27 LAB — MAGNESIUM
Magnesium: 1.7 mg/dL (ref 1.7–2.4)
Magnesium: 2.5 mg/dL — ABNORMAL HIGH (ref 1.7–2.4)

## 2022-02-27 LAB — EXPECTORATED SPUTUM ASSESSMENT W GRAM STAIN, RFLX TO RESP C

## 2022-02-27 LAB — CULTURE, RESPIRATORY W GRAM STAIN: Culture: NORMAL

## 2022-02-27 MED ORDER — MAGNESIUM SULFATE 4 GM/100ML IV SOLN
4.0000 g | Freq: Once | INTRAVENOUS | Status: AC
  Administered 2022-02-27: 4 g via INTRAVENOUS
  Filled 2022-02-27: qty 100

## 2022-02-27 MED ORDER — POTASSIUM CHLORIDE CRYS ER 20 MEQ PO TBCR
40.0000 meq | EXTENDED_RELEASE_TABLET | Freq: Four times a day (QID) | ORAL | Status: AC
  Administered 2022-02-27 (×3): 40 meq via ORAL
  Filled 2022-02-27 (×3): qty 2

## 2022-02-27 MED ORDER — FUROSEMIDE 10 MG/ML IJ SOLN
40.0000 mg | Freq: Two times a day (BID) | INTRAMUSCULAR | Status: DC
  Administered 2022-02-27 – 2022-03-02 (×7): 40 mg via INTRAVENOUS
  Filled 2022-02-27 (×7): qty 4

## 2022-02-27 MED ORDER — POTASSIUM CHLORIDE 20 MEQ PO PACK: 60.0000 meq | PACK | Freq: Three times a day (TID) | ORAL | Status: DC

## 2022-02-27 NOTE — Progress Notes (Signed)
?Progress Note ? ?Patient: Carrie [name] UYQ:[id number] DOB: 06/23/1941  ?DOA: 02/21/2022  DOS: 02/27/2022  ?  ?Brief hospital course: ?Carrie [name] is a 81 y.o. female with a history of PAF s/p DCCV 5/3 with ERAF, chronic HFpEF, Follicular lymphoma stage IIIa s/p chemotherapy with pancytopenia, hypothyroidism, HTN, HLD, stage IIIb CKD, and chronic cough since recovering from covid-19 infection early 2023 who presented to the ED 5/7 with shortness of breath, palpitations, lightheadedness found to be afebrile with AFib with RVR. This improved with amiodarone infusion and upward titration of beta blocker overseen by EP consultant. Later hospitalization complicated by pulmonary edema.  ? ?Assessment and Plan: ?Acute hypoxic respiratory failure: Note PASP 38 without morphologic features of pulmonary HTN. Remains hypoxic with ambulation.  ?- We've continued diuresis though she's still hypoxic with crackles. Further augment diuresis with persistent pulmonary edema. Monitor I/O, weights (relatively stable), and BMP (Cr improved) ?- PCT 0.31. With low WBC, cough, will complete 5 days typical abx. (Doxy instead of azithro given rhythm issues). Sputum culture normal flora.  ?- Also suspect element of deconditioning with weakness and cough since she had covid-19 (a common "long covid" pattern in elderly patients in my experience). Maximize antitussives. Suggest she consider rehabilitation due to significant weakness and living alone. Will continue to have therapy consult. We are working diligently to mobilize the patient. Maximal home health has been ordered through the [place]. I have shared my fear with her and her DIL that she may not fly at home no matter how medically ready she is given her weakness. That said, we'll need to know she's got dry lungs before discharge.  ? ?Hypomagnesemia:  ?- Supplement again today.  ? ?Hypokalemia: Pt unable to take solution of potassium yesterday, I wasn't aware and now K is 2.7. Aggressive  supplementation ordered po and will also give IV and recheck this PM to see where we're at since we're also increasing lasix.  ? ?PAF: s/p DCCV 02/18/2022 with ERAF. Asymptomatic. Dx 2016. Note hx of hypotension with diltiazem and discontinuation of flecainide in the past, presumably due to CM.  ?- Followed by EP here: Continue metoprolol '75mg'$  BID ?- Continue amiodarone '200mg'$  BID until AFib clinic follow up arranged 5/16 at 10:00am.  ?- Keep K and Mg replete, doing so as above ?- EP confirmed no plans for additional interventions/ablation or repeat DCCV due to frailty and since she's failed DCCV recurrently and is rate controlled and asymptomatic from that perspective.   ?- On eliquis. ? ?Stage IIIa follicular lymphoma: Treated at [place].  ?- Continue oncology follow up. Discussed with Dr. Alvy Bimler who reports last scan showed NED, advises against GCSF at this time. ? ?Leukopenia and anemia: Chronic. ANC < 1500. No bleeding. Afebrile. Hgb has stabilized ~9g/dl. ?- Hgb down to 7.8. Recheck in AM. No bleeding. ? ?Stage IIIb CKD:  ?- Keep strict I/O. Minimize nephrotoxins. Needs diuresis.  ? ?LFT elevation: Overall improving. No cirrhosis by U/S. ?- Recheck LFTs in AM. ? ?Subjective: Feels a bit better today but confirms she's very weak and still short of breath, still hypoxic. ? ?Objective: ?Vitals:  ? 02/26/22 2022 02/27/22 0518 02/27/22 0737 02/27/22 1206  ?BP: 101/67 112/79  (!) 115/56  ?Pulse: 66 90  65  ?Resp: '18 18  18  '$ ?Temp: 98 ?F (36.7 ?C) 97.8 ?F (36.6 ?C)  97.8 ?F (36.6 ?C)  ?TempSrc: Oral Oral  Axillary  ?SpO2: 95% 95% 96% 96%  ?Weight:  62.1 kg    ?Height:      ?  Gen: Pleasant, frail female in no distress ?Pulm: Nonlabored breathing at rest with supplemental oxygen. Persistent but improved bibasilar crackles. ?CV: Regular rate and rhythm. No murmur, rub, or gallop. No JVD, no significant dependent edema. ?GI: Abdomen soft, non-tender, non-distended, with normoactive bowel sounds.  ?Ext: Warm, no  deformities ?Skin: No rashes, lesions or ulcers on visualized skin. ?Neuro: Alert and oriented. No focal neurological deficits. ?Psych: Judgement and insight appear fair. Mood euthymic & affect congruent. Behavior is appropriate.   ? ?Data Personally reviewed: ? ?CBC: ?Recent Labs  ?Lab 02/21/22 ?1338 02/24/22 ?6237 02/25/22 ?0257 02/27/22 ?6283  ?WBC 1.5* 1.6* 1.8* 1.8*  ?NEUTROABS 1.0* 1.1* 1.5* 1.6*  ?HGB 9.9* 8.7* 9.1* 7.8*  ?HCT 31.0* 26.9* 27.9* 24.0*  ?MCV 90.1 88.8 88.6 87.6  ?PLT 292 258 278 240  ? ?Basic Metabolic Panel: ?Recent Labs  ?Lab 02/21/22 ?1657 02/22/22 ?0128 02/23/22 ?0202 02/24/22 ?1517 02/25/22 ?0257 02/26/22 ?1135 02/27/22 ?6160  ?NA  --  133* 131* 131* 130* 130* 132*  ?K  --  2.9* 3.9 3.1* 4.2 3.6 2.7*  ?CL  --  95* 95* 91* 96* 91* 100  ?CO2  --  '27 24 29 28 26 23  '$ ?GLUCOSE  --  140* 157* 124* 189* 261* 135*  ?BUN  --  '14 15 21 23 '$ 24* 24*  ?CREATININE  --  1.23* 1.27* 1.31* 1.38* 1.39* 1.09*  ?CALCIUM  --  8.0* 8.3* 8.1* 8.0* 8.1* 6.6*  ?MG 1.3* 2.8*  --   --   --  1.5* 1.7  ? ?GFR: ?Estimated Creatinine Clearance: 35.3 mL/min (A) (by C-G formula based on SCr of 1.09 mg/dL (H)). ?Liver Function Tests: ?Recent Labs  ?Lab 02/21/22 ?1338 02/22/22 ?0128 02/24/22 ?7371 02/27/22 ?0626  ?AST 92* 72* 59* 58*  ?ALT 56* 54* 56* 52*  ?ALKPHOS 104 131* 175* 117  ?BILITOT 2.0* 1.0 1.3* 0.5  ?PROT 4.6* 5.5* 5.3* 4.0*  ?ALBUMIN 2.2* 3.2* 3.0* 2.2*  ? ?Urine analysis: ?   ?Component Value Date/Time  ? COLORURINE YELLOW 02/21/2022 1340  ? APPEARANCEUR CLEAR 02/21/2022 1340  ? LABSPEC 1.010 02/21/2022 1340  ? LABSPEC 1.005 03/18/2017 1315  ? PHURINE 5.0 02/21/2022 1340  ? GLUCOSEU NEGATIVE 02/21/2022 1340  ? GLUCOSEU Negative 03/18/2017 1315  ? HGBUR SMALL (A) 02/21/2022 1340  ? [place] NEGATIVE 02/21/2022 1340  ? BILIRUBINUR Negative 03/18/2017 1315  ? [place] NEGATIVE 02/21/2022 1340  ? PROTEINUR NEGATIVE 02/21/2022 1340  ? UROBILINOGEN 0.2 03/18/2017 1315  ? NITRITE NEGATIVE 02/21/2022 1340  ?  LEUKOCYTESUR NEGATIVE 02/21/2022 1340  ? LEUKOCYTESUR Negative 03/18/2017 1315  ? ?Recent Results (from the past 240 hour(s))  ?Expectorated Sputum Assessment w Gram Stain, Rflx to Resp Cult     Status: None (Preliminary result)  ? Collection Time: 02/24/22 10:51 PM  ? Specimen: Expectorated Sputum  ?Result Value Ref Range Status  ? Specimen Description EXPECTORATED SPUTUM  Final  ? Special Requests NONE  Final  ? Sputum evaluation   Final  ?  THIS SPECIMEN IS ACCEPTABLE FOR SPUTUM CULTURE ?Performed at [facility], [place] [address], [place], [place] [address] ?  ? Report Status PENDING  Incomplete  ?Culture, Respiratory w Gram Stain     Status: None  ? Collection Time: 02/24/22 10:51 PM  ?Result Value Ref Range Status  ? Specimen Description EXPECTORATED SPUTUM  Final  ? Special Requests NONE Reflexed from [id number]  Final  ? Gram Stain   Final  ?  FEW SQUAMOUS EPITHELIAL CELLS PRESENT ?FEW  WBC PRESENT, PREDOMINANTLY MONONUCLEAR ?FEW GRAM POSITIVE COCCI IN PAIRS ?FEW GRAM VARIABLE ROD ?RARE YEAST ?  ? Culture   Final  ?  MODERATE Normal respiratory flora-no Staph aureus or Pseudomonas seen ?Performed at [facility], [place] [address], [place], [place] [address] ?  ? Report Status 02/27/2022 FINAL  Final  ?   ? ? ?Family Communication: None at bedside. Daughter-in-law by speaker phone at bedside ? ?Disposition: ?Status is: Inpatient ?Remains inpatient appropriate because: Persistent acute hypoxic respiratory failure requiring work up and management ?Planned Discharge Destination: Home with maximal Home Health ? ?Patrecia Pour, MD ?02/27/2022 5:01 PM ?Page by [email or web address]  ?

## 2022-02-27 NOTE — Progress Notes (Signed)
Occupational Therapy Treatment ?Patient Details ?Name: Carrie [name] ?MRN: [id number] ?DOB: 1940-10-29 ?Today's Date: 02/27/2022 ? ? ?History of present illness 81 y/o female admitted 5/7 with SOB and lightheadedness due to Afib. Admit 5/2-5/4 for same. PMHx:non-hodgkins lymphoma, Afib s/p cardioversion, CHF, HTN, CKD, chronic cough post Covid. ?  ?OT comments ? Patient continues to make incremental progress towards goals in skilled OT session. Patient's session encompassed sit<>stand transfer to recliner and lower body ADLs. Patient supine in bed eating lunch, plate on lap with patient in a near reclined position. Patient impulsive with transfer to chair, with posterior LOB into chair at end of session. Did not appear phased by LOB despite OT cues. Patient endorses that friend Carrie [name] is going to be able to assist her at home, and politely declining further therapeutic intervention. Discharge remains appropriate, OT will continue to follow acutely.   ? ?Recommendations for follow up therapy are one component of a multi-disciplinary discharge planning process, led by the attending physician.  Recommendations may be updated based on patient status, additional functional criteria and insurance authorization. ?   ?Follow Up Recommendations ? Home health OT  ?  ?Assistance Recommended at Discharge    ?Patient can return home with the following ? Direct supervision/assist for medications management;A little help with bathing/dressing/bathroom;A little help with walking and/or transfers;Direct supervision/assist for financial management;Assist for transportation ?  ?Equipment Recommendations ? None recommended by OT  ?  ?Recommendations for Other Services   ? ?  ?Precautions / Restrictions Precautions ?Precautions: Fall ?Precaution Comments: watch HR ?Restrictions ?Weight Bearing Restrictions: No  ? ? ?  ? ?Mobility Bed Mobility ?Overal bed mobility: Modified Independent ?Bed Mobility: Supine to Sit ?  ?  ?Supine to sit:  Modified independent (Device/Increase time) ?  ?  ?General bed mobility comments: pt able to transition to sitting without assist ?  ? ?Transfers ?Overall transfer level: Needs assistance ?Equipment used: None ?Transfers: Sit to/from Stand ?Sit to Stand: Min assist ?  ?  ?  ?  ?  ?General transfer comment: min A due to unsteadiness in standing, impulsive transfer to recliner, declining further therapeutic intervention ?  ?  ?Balance Overall balance assessment: Needs assistance ?Sitting-balance support: Feet supported, No upper extremity supported ?Sitting balance-Leahy Scale: Good ?Sitting balance - Comments: static sitting and donning shoes without assist ?  ?Standing balance support: During functional activity ?Standing balance-Leahy Scale: Fair ?Standing balance comment: imbalance when attempting to complete pivot transfer ?  ?  ?  ?  ?  ?  ?  ?  ?  ?  ?  ?   ? ?ADL either performed or assessed with clinical judgement  ? ?ADL Overall ADL's : Needs assistance/impaired ?Eating/Feeding: Independent;Sitting ?  ?  ?  ?  ?  ?  ?  ?  ?  ?  ?  ?Toilet Transfer: Minimal assistance;Stand-pivot ?Toilet Transfer Details (indicate cue type and reason): simulated with transfer to recliner ?  ?  ?  ?  ?Functional mobility during ADLs: Minimal assistance ?General ADL Comments: Patient progessing incrementally towards goals, endorses Carrie [name] will be here to help her ?  ? ?Extremity/Trunk Assessment   ?  ?  ?  ?  ?  ? ?Vision   ?  ?  ?Perception   ?  ?Praxis   ?  ? ?Cognition Arousal/Alertness: Awake/alert ?Behavior During Therapy: Flat affect, Impulsive ?Overall Cognitive Status: Impaired/Different from baseline ?Area of Impairment: Safety/judgement, Problem solving ?  ?  ?  ?  ?  ?  ?  ?  ?  ?  ?  ?  ?  Safety/Judgement: Decreased awareness of safety ?  ?Problem Solving: Requires verbal cues ?General Comments: Patient supine in bed eating lunch, plate on lap with patient in a near reclined position. Patient impulsive with transfer  to chair, with posterior LOB into chair at end of session. Did not appear phased by LOB ?  ?  ?   ?Exercises   ? ?  ?Shoulder Instructions   ? ? ?  ?General Comments    ? ? ?Pertinent Vitals/ Pain       Pain Assessment ?Pain Assessment: No/denies pain ? ?Home Living   ?  ?  ?  ?  ?  ?  ?  ?  ?  ?  ?  ?  ?  ?  ?  ?  ?  ?  ? ?  ?Prior Functioning/Environment    ?  ?  ?  ?   ? ?Frequency ? Min 2X/week  ? ? ? ? ?  ?Progress Toward Goals ? ?OT Goals(current goals can now be found in the care plan section) ? Progress towards OT goals: Progressing toward goals ? ?Acute Rehab OT Goals ?Patient Stated Goal: home ?OT Goal Formulation: With patient ?Time For Goal Achievement: 03/10/22 ?Potential to Achieve Goals: Good  ?Plan Discharge plan remains appropriate   ? ?Co-evaluation ? ? ?   ?  ?  ?  ?  ? ?  ?AM-PAC OT "6 Clicks" Daily Activity     ?Outcome Measure ? ? Help from another person eating meals?: None ?Help from another person taking care of personal grooming?: None ?Help from another person toileting, which includes using toliet, bedpan, or urinal?: A Little ?Help from another person bathing (including washing, rinsing, drying)?: A Little ?Help from another person to put on and taking off regular upper body clothing?: A Little ?Help from another person to put on and taking off regular lower body clothing?: A Little ?6 Click Score: 20 ? ?  ?End of Session Equipment Utilized During Treatment: Oxygen ? ?OT Visit Diagnosis: Unsteadiness on feet (R26.81);Muscle weakness (generalized) (M62.81) ?  ?Activity Tolerance Patient limited by fatigue ?  ?Patient Left in chair;with call bell/phone within reach ?  ?Nurse Communication Mobility status ?  ? ?   ? ?Time: 5465-0354 ?OT Time Calculation (min): 17 min ? ?Charges: OT General Charges ?$OT Visit: 1 Visit ?OT Treatments ?$Self Care/Home Management : 8-22 mins ? ?Carrie [name], OTR/L ?Acute Rehabilitation Services ?[phone]  ? ?Carrie [name] ?02/27/2022,  2:05 PM ?

## 2022-02-28 DIAGNOSIS — I5033 Acute on chronic diastolic (congestive) heart failure: Secondary | ICD-10-CM | POA: Diagnosis not present

## 2022-02-28 DIAGNOSIS — I4891 Unspecified atrial fibrillation: Secondary | ICD-10-CM | POA: Diagnosis not present

## 2022-02-28 DIAGNOSIS — I482 Chronic atrial fibrillation, unspecified: Secondary | ICD-10-CM | POA: Diagnosis not present

## 2022-02-28 DIAGNOSIS — C8238 Follicular lymphoma grade IIIa, lymph nodes of multiple sites: Secondary | ICD-10-CM | POA: Diagnosis not present

## 2022-02-28 LAB — RENAL FUNCTION PANEL
Albumin: 2.7 g/dL — ABNORMAL LOW (ref 3.5–5.0)
Anion gap: 11 (ref 5–15)
BUN: 30 mg/dL — ABNORMAL HIGH (ref 8–23)
CO2: 26 mmol/L (ref 22–32)
Calcium: 8.2 mg/dL — ABNORMAL LOW (ref 8.9–10.3)
Chloride: 91 mmol/L — ABNORMAL LOW (ref 98–111)
Creatinine, Ser: 1.32 mg/dL — ABNORMAL HIGH (ref 0.44–1.00)
GFR, Estimated: 41 mL/min — ABNORMAL LOW (ref >60)
Glucose, Bld: 141 mg/dL — ABNORMAL HIGH (ref 70–99)
Phosphorus: 3.3 mg/dL (ref 2.5–4.6)
Potassium: 3.9 mmol/L (ref 3.5–5.1)
Sodium: 128 mmol/L — ABNORMAL LOW (ref 135–145)

## 2022-02-28 LAB — MAGNESIUM: Magnesium: 2 mg/dL (ref 1.7–2.4)

## 2022-02-28 LAB — CBC
HCT: 30.3 % — ABNORMAL LOW (ref 36.0–46.0)
Hemoglobin: 9.4 g/dL — ABNORMAL LOW (ref 12.0–15.0)
MCH: 27.6 pg (ref 26.0–34.0)
MCHC: 31 g/dL (ref 30.0–36.0)
MCV: 89.1 fL (ref 80.0–100.0)
Platelets: 278 10*3/uL (ref 150–400)
RBC: 3.4 MIL/uL — ABNORMAL LOW (ref 3.87–5.11)
RDW: 15.2 % (ref 11.5–15.5)
WBC: 2.3 10*3/uL — ABNORMAL LOW (ref 4.0–10.5)
nRBC: 0 % (ref 0.0–0.2)

## 2022-02-28 NOTE — Progress Notes (Signed)
?Progress Note ? ?Patient: Carrie [name] DOB: 1941-01-19  ?DOA: 02/21/2022  DOS: 02/28/2022  ?  ?Brief hospital course: ?Carrie [name] is a 81 y.o. female with a history of PAF s/p DCCV 5/3 with ERAF, chronic HFpEF, Follicular lymphoma stage IIIa s/p chemotherapy with pancytopenia, hypothyroidism, HTN, HLD, stage IIIb CKD, and chronic cough since recovering from covid-19 infection early 2023 who presented to the ED 5/7 with shortness of breath, palpitations, lightheadedness found to be afebrile with AFib with RVR. This improved with amiodarone infusion and upward titration of beta blocker overseen by EP consultant. Later hospitalization complicated by pulmonary edema.  ? ?Assessment and Plan: ?Acute hypoxic respiratory failure: Note PASP 38 without morphologic features of pulmonary HTN. Remains hypoxic with ambulation.  ?- Continue diuresis which is likely primary contributor.  ?- Complete Tx for PNA on 5/14. Sputum culture normal flora.  ?- May need to discharge with supplemental oxygen.  ? ?Deconditioning, frailty: Progressive since covid-19 infection in Oct 2022. Suspect she is experiencing continued decline and will best be served by rehabilitation at SNF level of care to get stronger. This is also a safer alternative at discharge than home where she has only intermittent assistance.  ?- We will continue to work with PT, OT, getting OOB.  ?- Patient and family understanding of the thoughts above. ? ?Hypomagnesemia:  ?- Supplemented ? ?Hypokalemia:  ?- Continue oral supplementation, has improved to normal range. Goal 4+.  ? ?PAF: s/p DCCV 02/18/2022 with ERAF. Asymptomatic. Dx 2016. Note hx of hypotension with diltiazem and discontinuation of flecainide in the past, presumably due to CM.  ?- Followed by EP here: Continue metoprolol '75mg'$  BID ?- Continue amiodarone '200mg'$  BID until AFib clinic follow up arranged 5/16 at 10:00am.  ?- Keep K and Mg replete, doing so as above ?- EP confirmed no plans for  additional interventions/ablation or repeat DCCV due to frailty and since she's failed DCCV recurrently and is rate controlled and asymptomatic from that perspective.   ?- On eliquis. ? ?Orthostatic hypotension: Difficult situation as she needs to diurese due to pulmonary edema causing hypoxic respiratory failure, though is symptomatically hypotensive when standing today.  ?- We will continue diuresis one more day, repeat orthostatics in AM. Already received metoprolol this morning and rates are marginal anyway, so wouldn't be able to hold this. ? ?Stage IIIa follicular lymphoma: Treated at [place].  ?- Continue oncology follow up. Discussed with Dr. Alvy Bimler who reports last scan showed NED, advises against GCSF at this time. ? ?Leukopenia and anemia: Chronic. No bleeding. Afebrile. Hgb has stabilized ~9g/dl. ?- Monitor intermittently. ? ?Stage IIIb CKD:  ?- Keep strict I/O. Minimize nephrotoxins. Still needs diuresis, will continue to monitor. Cr stable in 1.3's with rising BUN. ? ?LFT elevation: Overall improving-to-improving. No cirrhosis by U/S. ?- Recheck LFTs at follow up ? ?Subjective: Says she feels better and desperately wants to go home. We decided to road test her, see if she'll be able to get around at home as she says she will have some but not consistent help at home. She was significantly lightheaded when she got up, limiting ambulation to 10 feet. She agrees she can't go home like that.  ? ?Objective: ?Vitals:  ? 02/28/22 0503 02/28/22 0926 02/28/22 1240 02/28/22 1610  ?BP: 103/81 97/83 (!) 88/58 104/68  ?Pulse: (!) 117 (!) 113 (!) 114 60  ?Resp: '19 20 19   '$ ?Temp: 99.3 ?F (37.4 ?C)  98.5 ?F (36.9 ?C)   ?TempSrc: Oral  Oral   ?SpO2: 95% 95% 96% 94%  ?Weight: 61.6 kg     ?Height:      ?Gen: Frail female in no distress ?Pulm: Nonlabored breathing supplemental oxygen with persistent R > L crackles. ?CV: Irrerg irreg, rate in 100's, up to 110's with sitting up in bed. No murmur, rub, or gallop. No JVD, no  dependent edema. ?GI: Abdomen soft, non-tender, non-distended, with normoactive bowel sounds.  ?Ext: Warm, no deformities ?Skin: No new rashes, lesions or ulcers on visualized skin. ?Neuro: Alert and oriented. No focal neurological deficits. ?Psych: Judgement and insight appear fair. Mood euthymic & affect congruent. Behavior is appropriate.   ? ?Data Personally reviewed: ? ?CBC: ?Recent Labs  ?Lab 02/24/22 ?6606 02/25/22 ?0257 02/27/22 ?3016 02/28/22 ?0709  ?WBC 1.6* 1.8* 1.8* 2.3*  ?NEUTROABS 1.1* 1.5* 1.6*  --   ?HGB 8.7* 9.1* 7.8* 9.4*  ?HCT 26.9* 27.9* 24.0* 30.3*  ?MCV 88.8 88.6 87.6 89.1  ?PLT 258 278 240 278  ? ?Basic Metabolic Panel: ?Recent Labs  ?Lab 02/22/22 ?0128 02/23/22 ?0202 02/25/22 ?0109 02/26/22 ?1135 02/27/22 ?3235 02/27/22 ?1830 02/28/22 ?5732  ?NA 133*   < > 130* 130* 132* 128* 128*  ?K 2.9*   < > 4.2 3.6 2.7* 4.2 3.9  ?CL 95*   < > 96* 91* 100 91* 91*  ?CO2 27   < > '28 26 23 27 26  '$ ?GLUCOSE 140*   < > 189* 261* 135* 187* 141*  ?BUN 14   < > 23 24* 24* 28* 30*  ?CREATININE 1.23*   < > 1.38* 1.39* 1.09* 1.34* 1.32*  ?CALCIUM 8.0*   < > 8.0* 8.1* 6.6* 7.7* 8.2*  ?MG 2.8*  --   --  1.5* 1.7 2.5* 2.0  ?PHOS  --   --   --   --   --  3.3 3.3  ? < > = values in this interval not displayed.  ? ?GFR: ?Estimated Creatinine Clearance: 29.1 mL/min (A) (by C-G formula based on SCr of 1.32 mg/dL (H)). ?Liver Function Tests: ?Recent Labs  ?Lab 02/22/22 ?0128 02/24/22 ?2025 02/27/22 ?4270 02/27/22 ?1830 02/28/22 ?6237  ?AST 72* 59* 58*  --   --   ?ALT 54* 56* 52*  --   --   ?ALKPHOS 131* 175* 117  --   --   ?BILITOT 1.0 1.3* 0.5  --   --   ?PROT 5.5* 5.3* 4.0*  --   --   ?ALBUMIN 3.2* 3.0* 2.2* 2.7* 2.7*  ? ?Urine analysis: ?   ?Component Value Date/Time  ? COLORURINE YELLOW 02/21/2022 1340  ? APPEARANCEUR CLEAR 02/21/2022 1340  ? LABSPEC 1.010 02/21/2022 1340  ? LABSPEC 1.005 03/18/2017 1315  ? PHURINE 5.0 02/21/2022 1340  ? GLUCOSEU NEGATIVE 02/21/2022 1340  ? GLUCOSEU Negative 03/18/2017 1315  ? HGBUR SMALL  (A) 02/21/2022 1340  ? [place] NEGATIVE 02/21/2022 1340  ? BILIRUBINUR Negative 03/18/2017 1315  ? [place] NEGATIVE 02/21/2022 1340  ? PROTEINUR NEGATIVE 02/21/2022 1340  ? UROBILINOGEN 0.2 03/18/2017 1315  ? NITRITE NEGATIVE 02/21/2022 1340  ? LEUKOCYTESUR NEGATIVE 02/21/2022 1340  ? LEUKOCYTESUR Negative 03/18/2017 1315  ? ?Recent Results (from the past 240 hour(s))  ?Expectorated Sputum Assessment w Gram Stain, Rflx to Resp Cult     Status: None  ? Collection Time: 02/24/22 10:51 PM  ? Specimen: Expectorated Sputum  ?Result Value Ref Range Status  ? Specimen Description EXPECTORATED SPUTUM  Final  ? Special Requests NONE  Final  ? Sputum evaluation  Final  ?  THIS SPECIMEN IS ACCEPTABLE FOR SPUTUM CULTURE ?Performed at [facility], [place] [address], [place], [place] [address] ?  ? Report Status 02/27/2022 FINAL  Final  ?Culture, Respiratory w Gram Stain     Status: None  ? Collection Time: 02/24/22 10:51 PM  ?Result Value Ref Range Status  ? Specimen Description EXPECTORATED SPUTUM  Final  ? Special Requests NONE Reflexed from [id number]  Final  ? Gram Stain   Final  ?  FEW SQUAMOUS EPITHELIAL CELLS PRESENT ?FEW WBC PRESENT, PREDOMINANTLY MONONUCLEAR ?FEW GRAM POSITIVE COCCI IN PAIRS ?FEW GRAM VARIABLE ROD ?RARE YEAST ?  ? Culture   Final  ?  MODERATE Normal respiratory flora-no Staph aureus or Pseudomonas seen ?Performed at [facility], [place] [address], [place], [place] [address] ?  ? Report Status 02/27/2022 FINAL  Final  ?   ? ? ?Family Communication: None at bedside. Daughter-in-law by phone today. ? ?Disposition: ?Status is: Inpatient ?Remains inpatient appropriate because: Symptomatic orthostatic hypotension, hypoxic respiratory failure, unsafe DC ?Planned Discharge Destination: Home with maximal Home Health. Unable to safely discharge to home environment at this time. May need PT/OT reevaluations. ? ?Patrecia Pour, MD ?02/28/2022 4:23 PM ?Page by [email or web address]  ?

## 2022-02-28 NOTE — Progress Notes (Signed)
Mobility Specialist Progress Note  ? ? 02/28/22 1125  ?Mobility  ?Activity Transferred from bed to chair  ?Level of Assistance Contact guard assist, steadying assist  ?Assistive Device Other (Comment) ?(HHA)  ?Distance Ambulated (ft) 10 ft  ?Activity Response Tolerated fair  ?$Mobility charge 1 Mobility  ? ?Pre-Mobility: 95 HR, 93% SpO2 ? ?Pt received in bed and agreeable to ambulation. Upon sitting up, pt c/o lightheadedness. Attempted orthostatic BP but pt could not tolerate standing for the entire 0 minute period. Assisted to sitting up in chair. All activity completed on RA. RN notified. Left in chair with call bell in reach and RN present.  ? ?Orthostatic BPs ?Supine 98/69  ?Sitting 90/76  ?Standing 0 minutes 82/65  ?   ?   ?  ?Hildred Alamin ?Mobility Specialist  ?Primary: 5N M.S. Phone: [phone] ?Secondary: 6N M.S. Phone: [phone] ?  ?

## 2022-02-28 NOTE — Progress Notes (Signed)
Tried to get patient up out of bed. She was feeling weak, bp starting 98/69, standing bp 90/76 standing 82/65. She could not stand for an entire minute, had to sit down bc she was feeling dizzy. I notified Dr. Bonner Puna and he stated to cancel her discharge. Pt informed and although relucant, she understood.  ? ?

## 2022-03-01 ENCOUNTER — Inpatient Hospital Stay (HOSPITAL_COMMUNITY): Payer: No Typology Code available for payment source

## 2022-03-01 DIAGNOSIS — C8238 Follicular lymphoma grade IIIa, lymph nodes of multiple sites: Secondary | ICD-10-CM | POA: Diagnosis not present

## 2022-03-01 DIAGNOSIS — I4891 Unspecified atrial fibrillation: Secondary | ICD-10-CM | POA: Diagnosis not present

## 2022-03-01 DIAGNOSIS — I5033 Acute on chronic diastolic (congestive) heart failure: Secondary | ICD-10-CM | POA: Diagnosis not present

## 2022-03-01 DIAGNOSIS — I482 Chronic atrial fibrillation, unspecified: Secondary | ICD-10-CM | POA: Diagnosis not present

## 2022-03-01 DIAGNOSIS — I951 Orthostatic hypotension: Secondary | ICD-10-CM | POA: Diagnosis not present

## 2022-03-01 DIAGNOSIS — I959 Hypotension, unspecified: Secondary | ICD-10-CM

## 2022-03-01 LAB — MAGNESIUM: Magnesium: 1.6 mg/dL — ABNORMAL LOW (ref 1.7–2.4)

## 2022-03-01 LAB — BASIC METABOLIC PANEL
Anion gap: 12 (ref 5–15)
BUN: 31 mg/dL — ABNORMAL HIGH (ref 8–23)
CO2: 27 mmol/L (ref 22–32)
Calcium: 8.1 mg/dL — ABNORMAL LOW (ref 8.9–10.3)
Chloride: 91 mmol/L — ABNORMAL LOW (ref 98–111)
Creatinine, Ser: 1.39 mg/dL — ABNORMAL HIGH (ref 0.44–1.00)
GFR, Estimated: 39 mL/min — ABNORMAL LOW (ref >60)
Glucose, Bld: 115 mg/dL — ABNORMAL HIGH (ref 70–99)
Potassium: 3.2 mmol/L — ABNORMAL LOW (ref 3.5–5.1)
Sodium: 130 mmol/L — ABNORMAL LOW (ref 135–145)

## 2022-03-01 MED ORDER — TRAZODONE HCL 50 MG PO TABS
50.0000 mg | ORAL_TABLET | Freq: Every day | ORAL | Status: DC
  Administered 2022-03-01 – 2022-03-02 (×2): 50 mg via ORAL
  Filled 2022-03-01 (×2): qty 1

## 2022-03-01 MED ORDER — MAGNESIUM SULFATE 2 GM/50ML IV SOLN
2.0000 g | Freq: Once | INTRAVENOUS | Status: AC
Start: 2022-03-01 — End: 2022-03-01
  Administered 2022-03-01: 2 g via INTRAVENOUS
  Filled 2022-03-01: qty 50

## 2022-03-01 MED ORDER — POTASSIUM CHLORIDE CRYS ER 20 MEQ PO TBCR
40.0000 meq | EXTENDED_RELEASE_TABLET | Freq: Two times a day (BID) | ORAL | Status: AC
  Administered 2022-03-01 (×2): 40 meq via ORAL
  Filled 2022-03-01 (×2): qty 2

## 2022-03-01 MED ORDER — MAGNESIUM SULFATE 2 GM/50ML IV SOLN
2.0000 g | Freq: Once | INTRAVENOUS | Status: AC
  Administered 2022-03-01: 2 g via INTRAVENOUS
  Filled 2022-03-01: qty 50

## 2022-03-01 NOTE — Progress Notes (Addendum)
?Progress Note ? ?Patient: Carrie [name] VVO:[id number] DOB: 1941-09-18  ?DOA: 02/21/2022  DOS: 03/01/2022  ?  ?Brief hospital course: ?Carrie [name] is a 81 y.o. female with a history of PAF s/p DCCV 5/3 with ERAF, chronic HFpEF, Follicular lymphoma stage IIIa s/p chemotherapy with pancytopenia, hypothyroidism, HTN, HLD, stage IIIb CKD, and chronic cough since recovering from covid-19 infection early 2023 who presented to the ED 5/7 with shortness of breath, palpitations, lightheadedness found to be afebrile with AFib with RVR. This improved with amiodarone infusion and upward titration of beta blocker overseen by EP consultant. Later hospitalization complicated by pulmonary edema.  ? ?Assessment and Plan: ?Acute hypoxic respiratory failure: Note PASP 38 without morphologic features of pulmonary HTN. Remains hypoxic with ambulation.  ?- Continue diuresis for CHF which is likely primary contributor. Remains with crackles on exam and interstitial edema with small pleural effusions per my review of AM CXR. ?- Completed Tx for PNA on 5/14. Sputum culture normal flora.  ?- May need to discharge with supplemental oxygen.  ? ?Deconditioning, frailty: Progressive since covid-19 infection in Oct 2022. Suspect she is experiencing continued decline and will best be served by rehabilitation at SNF level of care to get stronger. This is also a safer alternative at discharge than home where she has only intermittent assistance. PT recommending SNF, will have CSW engage pt/family who are in agreement.  ? ?Hypomagnesemia:  ?- Supplemented ? ?Hypokalemia:  ?- Continue oral supplementation. ? ?PAF: s/p DCCV 02/18/2022 with ERAF. Asymptomatic. Dx 2016. Note hx of hypotension with diltiazem and discontinuation of flecainide in the past, presumably due to CM.  ?- Followed by EP here: Continue metoprolol '75mg'$  BID ?- Continue amiodarone '200mg'$  BID until AFib clinic follow up arranged 5/16 at 10:00am.  ?- Keep K and Mg replete, doing so as  above ?- EP confirmed no plans for additional interventions/ablation or repeat DCCV due to frailty and since she's failed DCCV recurrently and is rate controlled and asymptomatic from that perspective.   ?- On eliquis. ? ?Orthostatic hypotension: Difficult situation as she needs to diurese due to pulmonary edema causing hypoxic respiratory failure, though is symptomatically hypotensive when standing today.  ?- Remains orthostatic, though needs diuresis. ?if metoprolol dose can be decreased, will ask for cardiology opinion.  ?- Start TED hose.  ? ?Stage IIIa follicular lymphoma: Treated at [place].  ?- Continue oncology follow up. Discussed with Dr. Alvy Bimler again today who will reschedule her follow up appointment. Has reported last scan showed NED, advises against GCSF at this time. ? ?Leukopenia and anemia: Chronic. No bleeding. Afebrile. Hgb has stabilized ~9g/dl. ?- Monitor intermittently. ? ?Stage IIIb CKD:  ?- Keep strict I/O. Minimize nephrotoxins. Still needs diuresis, will continue to monitor. Cr remains stable in 1.3's with rising BUN. ? ?LFT elevation: Overall improving-to-improving. No cirrhosis by U/S. ?- Recheck LFTs at follow up ? ?Hyponatremia: Slightly improved today with diuresis. ? ?Insomnia:  ?- Trazodone qHS, will minimize nocturnal interruptions. ? ?Subjective: Still severely orthostatic this AM, diffusely weak as well, amenable to SNF rehabilitation. ? ?Objective: ?Vitals:  ? 02/28/22 1952 03/01/22 0514 03/01/22 0827 03/01/22 1029  ?BP: 108/63 107/78  101/73  ?Pulse: 71 67  87  ?Resp: '18 18  20  '$ ?Temp: 98.2 ?F (36.8 ?C) 98 ?F (36.7 ?C)  98 ?F (36.7 ?C)  ?TempSrc: Oral Oral  Oral  ?SpO2: 96% 95% 95% 96%  ?Weight:  61.2 kg    ?Height:      ?Gen: 81 y.o. female  in no distress ?Pulm: Nonlabored breathing supplemental oxygen with crackles at bases ?CV: Irreg irreg without murmur, rub, or gallop. No JVD, no significant dependent edema. ?GI: Abdomen soft, non-tender, non-distended, with normoactive bowel  sounds.  ?Ext: Warm, no deformities ?Skin: No new rashes, lesions or ulcers on visualized skin. ?Neuro: Alert and oriented. No focal neurological deficits. ?Psych: Judgement and insight appear fair. Mood euthymic & affect congruent. Behavior is appropriate.   ? ?Data Personally reviewed: ? ?CBC: ?Recent Labs  ?Lab 02/24/22 ?7416 02/25/22 ?0257 02/27/22 ?3845 02/28/22 ?0709  ?WBC 1.6* 1.8* 1.8* 2.3*  ?NEUTROABS 1.1* 1.5* 1.6*  --   ?HGB 8.7* 9.1* 7.8* 9.4*  ?HCT 26.9* 27.9* 24.0* 30.3*  ?MCV 88.8 88.6 87.6 89.1  ?PLT 258 278 240 278  ? ?Basic Metabolic Panel: ?Recent Labs  ?Lab 02/26/22 ?1135 02/27/22 ?3646 02/27/22 ?1830 02/28/22 ?8032 03/01/22 ?0731  ?NA 130* 132* 128* 128* 130*  ?K 3.6 2.7* 4.2 3.9 3.2*  ?CL 91* 100 91* 91* 91*  ?CO2 '26 23 27 26 27  '$ ?GLUCOSE 261* 135* 187* 141* 115*  ?BUN 24* 24* 28* 30* 31*  ?CREATININE 1.39* 1.09* 1.34* 1.32* 1.39*  ?CALCIUM 8.1* 6.6* 7.7* 8.2* 8.1*  ?MG 1.5* 1.7 2.5* 2.0 1.6*  ?PHOS  --   --  3.3 3.3  --   ? ?GFR: ?Estimated Creatinine Clearance: 27.6 mL/min (A) (by C-G formula based on SCr of 1.39 mg/dL (H)). ?Liver Function Tests: ?Recent Labs  ?Lab 02/24/22 ?1224 02/27/22 ?8250 02/27/22 ?1830 02/28/22 ?0370  ?AST 59* 58*  --   --   ?ALT 56* 52*  --   --   ?ALKPHOS 175* 117  --   --   ?BILITOT 1.3* 0.5  --   --   ?PROT 5.3* 4.0*  --   --   ?ALBUMIN 3.0* 2.2* 2.7* 2.7*  ? ?Urine analysis: ?   ?Component Value Date/Time  ? COLORURINE YELLOW 02/21/2022 1340  ? APPEARANCEUR CLEAR 02/21/2022 1340  ? LABSPEC 1.010 02/21/2022 1340  ? LABSPEC 1.005 03/18/2017 1315  ? PHURINE 5.0 02/21/2022 1340  ? GLUCOSEU NEGATIVE 02/21/2022 1340  ? GLUCOSEU Negative 03/18/2017 1315  ? HGBUR SMALL (A) 02/21/2022 1340  ? [place] NEGATIVE 02/21/2022 1340  ? BILIRUBINUR Negative 03/18/2017 1315  ? [place] NEGATIVE 02/21/2022 1340  ? PROTEINUR NEGATIVE 02/21/2022 1340  ? UROBILINOGEN 0.2 03/18/2017 1315  ? NITRITE NEGATIVE 02/21/2022 1340  ? LEUKOCYTESUR NEGATIVE 02/21/2022 1340  ? LEUKOCYTESUR  Negative 03/18/2017 1315  ? ?Recent Results (from the past 240 hour(s))  ?Expectorated Sputum Assessment w Gram Stain, Rflx to Resp Cult     Status: None  ? Collection Time: 02/24/22 10:51 PM  ? Specimen: Expectorated Sputum  ?Result Value Ref Range Status  ? Specimen Description EXPECTORATED SPUTUM  Final  ? Special Requests NONE  Final  ? Sputum evaluation   Final  ?  THIS SPECIMEN IS ACCEPTABLE FOR SPUTUM CULTURE ?Performed at [facility], [place] [address], [place], [place] [address] ?  ? Report Status 02/27/2022 FINAL  Final  ?Culture, Respiratory w Gram Stain     Status: None  ? Collection Time: 02/24/22 10:51 PM  ?Result Value Ref Range Status  ? Specimen Description EXPECTORATED SPUTUM  Final  ? Special Requests NONE Reflexed from [id number]  Final  ? Gram Stain   Final  ?  FEW SQUAMOUS EPITHELIAL CELLS PRESENT ?FEW WBC PRESENT, PREDOMINANTLY MONONUCLEAR ?FEW GRAM POSITIVE COCCI IN PAIRS ?FEW GRAM VARIABLE ROD ?RARE YEAST ?  ?  Culture   Final  ?  MODERATE Normal respiratory flora-no Staph aureus or Pseudomonas seen ?Performed at [facility], [place] [address], [place], [place] [address] ?  ? Report Status 02/27/2022 FINAL  Final  ?   ? ? ?Family Communication: None at bedside. DIL and daughter by speaker phone at bedside today. ? ?Disposition: ?Status is: Inpatient ?Remains inpatient appropriate because: Symptomatic orthostatic hypotension, hypoxic respiratory failure, unsafe DC ?Planned Discharge Destination: SNF. Unable to safely discharge to home environment at this time.  ? ?Patrecia Pour, MD ?03/01/2022 11:53 AM ?Page by [email or web address]  ?

## 2022-03-01 NOTE — NC FL2 (Deleted)
?[place] MEDICAID FL2 LEVEL OF CARE SCREENING TOOL  ?  ? ?IDENTIFICATION  ?Patient Name: ?Carrie [name] Birthdate: 01-14-1941 Sex: female Admission Date (Current Location): ?02/21/2022  ?[place] and [place] Number: ? [place] ?  Facility and Address:  ?[facility], [place] [address], [place], [place] [address] ?     Provider Number: ?[id number]  ?Attending Physician Name and Address:  ?Patrecia Pour, MD ? Relative Name and Phone Number:  ?Urban Gibson [phone] ?   ?Current Level of Care: ?Hospital Recommended Level of Care: ?[place] Prior Approval Number: ?  ? ?Date Approved/Denied: ?  PASRR Number: ?[id number] A ? ?Discharge Plan: ?SNF ?  ? ?Current Diagnoses: ?Patient Active Problem List  ? Diagnosis Date Noted  ? A-fib ([place]) 02/21/2022  ? Diarrhea 02/17/2022  ? Asymptomatic bacteriuria 02/17/2022  ? Chronic anemia 02/17/2022  ? Head trauma 02/02/2022  ? HFimpEF (Heart Failure with improved EF) 02/01/2022  ? Coronary artery disease involving native coronary artery of native heart without angina pectoris 02/01/2022  ? Acute renal failure superimposed on stage 3a chronic kidney disease ([place]) 01/23/2022  ? Chronic atrial fibrillation with RVR ([place]) 01/22/2022  ? Atrial fibrillation ([place]) 01/21/2022  ? Atrial fibrillation with RVR ([place]) 01/21/2022  ? Aortic atherosclerosis ([place]) 01/10/2022  ? (HFimpEF) heart failure with improved ejection fraction ([place]) 12/30/2021  ? Headache 12/30/2021  ? Acute otitis media 10/27/2021  ? Acquired hypercoagulable state ([place]) 09/22/2021  ? Influenza A 09/15/2021  ? Cough in adult 09/09/2021  ? H/O total shoulder replacement, left 03/02/2021  ? Deficiency anemia 02/03/2021  ? Pulmonary hypertension ([place]) 10/27/2020  ? Thrombocytopenia ([place]) 09/01/2020  ? Goals of care, counseling/discussion 07/23/2020  ? Leukopenia 09/25/2019  ? Syncope 03/05/2019  ? Postural dizziness with presyncope 03/04/2019  ? Atypical chest pain   ? Near syncope   ? Skin  lesions 06/06/2017  ? Shortness of breath 03/25/2017  ? Anxiety disorder 03/25/2017  ? Generalized weakness 03/19/2017  ? Insomnia disorder 03/19/2017  ? Dysuria 08/31/2016  ? Acute back pain with sciatica, right 08/31/2016  ? Chronic back pain 07/02/2016  ? Quality of life palliative care encounter 03/02/2016  ? Port catheter in place 03/01/2016  ? Skin rash 11/03/2015  ? Hypokalemia 07/02/2015  ? Essential hypertension 07/02/2015  ? 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 Stage 3a chronic kidney disease ([place]) 06/25/2015  ? Chronic knee pain after total replacement of right knee joint 04/09/2015  ? Chronic left shoulder pain 03/31/2015  ? Other fatigue 03/07/2015  ? Grade 3a follicular lymphoma of lymph nodes of multiple regions ([place]) 02/03/2015  ? Hypothyroidism 09/09/2009  ? ? ?Orientation RESPIRATION BLADDER Height & Weight   ?  ?Self, Time, Situation, Place ? O2 (Nasal Cannula 2 liters) Incontinent, External catheter (External Urinary Catheter) Weight: 134 lb 14.7 oz (61.2 kg) ?Height:  '5\' 1"'$  (154.9 cm)  ?BEHAVIORAL SYMPTOMS/MOOD NEUROLOGICAL BOWEL NUTRITION STATUS  ?    Incontinent Diet (Please see discharge summary)  ?AMBULATORY STATUS COMMUNICATION OF NEEDS Skin   ?Limited Assist Verbally Other (Comment) (appropriate for ethnicity,dry,ecchymosis,arm,bilateral) ?  ?  ?  ?    ?     ?     ? ? ?Personal Care Assistance Level of Assistance  ?Bathing, Feeding, Dressing Bathing Assistance: Limited assistance ?Feeding assistance: Independent (able to feed self) ?Dressing Assistance: Limited assistance ?   ? ?Functional Limitations Info  ?Sight, Hearing, Speech Sight Info: Impaired ?Hearing Info: Adequate ?Speech Info: Adequate  ? ? ?  SPECIAL CARE FACTORS FREQUENCY  ?PT (By licensed PT), OT (By licensed OT)   ?  ?PT Frequency: 5x min weelkly ?OT Frequency: 5x min weekly ?  ?  ?  ?   ? ? ?Contractures Contractures Info: Not present  ? ? ?Additional Factors Info  ?Code Status, Allergies, Psychotropic  Code Status Info: DNR ?Allergies Info: Amiodarone ?Psychotropic Info: traZODone (DESYREL) tablet 50 mg daily at bedtime ?  ?  ?   ? ?Current Medications (03/01/2022):  This is the current hospital active medication list ?Current Facility-Administered Medications  ?Medication Dose Route Frequency Provider Last Rate Last Admin  ? 0.9 %  sodium chloride infusion  250 mL Intravenous PRN Wynetta Fines T, MD      ? acetaminophen (TYLENOL) tablet 650 mg  650 mg Oral Q4H PRN Wynetta Fines T, MD      ? amiodarone (PACERONE) tablet 200 mg  200 mg Oral BID Baldwin Jamaica, PA-C   200 mg at 03/01/22 1102  ? apixaban (ELIQUIS) tablet 5 mg  5 mg Oral BID Wynetta Fines T, MD   5 mg at 03/01/22 1114  ? Chlorhexidine Gluconate Cloth 2 % PADS 6 each  6 each Topical Daily Lequita Halt, MD   6 each at 03/01/22 1029  ? furosemide (LASIX) injection 40 mg  40 mg Intravenous BID Patrecia Pour, MD   40 mg at 03/01/22 7591  ? levothyroxine (SYNTHROID) tablet 88 mcg  88 mcg Oral QAC breakfast Lequita Halt, MD   88 mcg at 03/01/22 0511  ? metoprolol tartrate (LOPRESSOR) tablet 75 mg  75 mg Oral BID Baldwin Jamaica, PA-C   75 mg at 03/01/22 1102  ? ondansetron (ZOFRAN) injection 4 mg  4 mg Intravenous Q6H PRN Wynetta Fines T, MD      ? potassium chloride SA (KLOR-CON M) CR tablet 40 mEq  40 mEq Oral BID Vance Gather B, MD   40 mEq at 03/01/22 1102  ? sodium chloride flush (NS) 0.9 % injection 10-40 mL  10-40 mL Intracatheter PRN Lequita Halt, MD   20 mL at 02/28/22 0558  ? sodium chloride flush (NS) 0.9 % injection 3 mL  3 mL Intravenous Q12H Wynetta Fines T, MD   3 mL at 03/01/22 1114  ? sodium chloride flush (NS) 0.9 % injection 3 mL  3 mL Intravenous PRN Wynetta Fines T, MD      ? traZODone (DESYREL) tablet 50 mg  50 mg Oral QHS Patrecia Pour, MD      ? ?Facility-Administered Medications Ordered in Other Encounters  ?Medication Dose Route Frequency Provider Last Rate Last Admin  ? sodium chloride 0.9 % injection 10 mL  10 mL Intravenous PRN  Alvy Bimler, Ni, MD   10 mL at 04/05/17 1315  ? ? ? ?Discharge Medications: ?Please see discharge summary for a list of discharge medications. ? ?Relevant Imaging Results: ? ?Relevant Lab Results: ? ? ?Additional Information ?[id number], Both Covid Vaccines ? ?[name], LCSWA ? ? ? ? ?

## 2022-03-01 NOTE — TOC Initial Note (Addendum)
Transition of Care (TOC) - Initial/Assessment Note  ? ? ?Patient Details  ?Name: Carrie [name] ?MRN: [id number] ?Date of Birth: 19-Apr-1941 ? ?Transition of Care (TOC) CM/SW Contact:    ?[name], LCSWA ?Phone Number: ?03/01/2022, 1:22 PM ? ?Clinical Narrative:       ? ?Update3:35pm- CSW received bed offer for [facility] under patients [employer] medicare benefit.CSW provided patients daughter with SNF bed offer. Patients daughter is going to review and give CSW callback. ? ?Update-2:10pm- CSW LVM for [place] care of [facility] admission. CSW awaiting callback.   CSW faxed over referral for review to [facility] [place] SNFs for review. ? ?Update- 2:05pm- CSW received call from patients daughter [name]. CSW discussed PT recommendation with patients daughter and patients daughter also in agreement for SNF placement for patient when patient is medically ready for dc. Patients daughter would like for CSW to fax out patients initial referral near the [place] area. [place] facilities that patients daughter would like for CSW to fax out initial referral to is [facility], and [facility].  ? ?CSW received consult for possible SNF placement at time of discharge. CSW spoke with patient at bedside regarding PT recommendation of SNF placement at time of discharge. Patient reports she comes from home alone. Patient expressed understanding of PT recommendation and is agreeable to SNF placement at time of discharge. Patient request for CSW to call her daughter regarding SNF placement. Patients daughter is from [place] and Patient reports daughter would like for patient to get into a SNF in [place].Patient reports she plans to stay with daughter after rehab.CSW discussed insurance authorization process with patient. Patient agreeable for CSW to use her [employer] or [place] insurance for SNF placement.  Patient gave CSW a copy of her [employer] card. CSW faxed copy of card to admissions.Patient reports she  has received the COVID vaccines.CSW faxed over [facility] screening list to [facility] for review to see if patient can use her [place] insurance for SNF. Patient gave permission to use either insurance for possible SNF placement. CSW called patients daughter and LVM to discuss SNF placement for patient. CSW awaiting callback.No further questions reported at this time. CSW to continue to follow and assist with discharge planning needs.  ? ?Expected Discharge Plan: [place] ?Barriers to Discharge: Continued Medical Work up ? ? ?Patient Goals and CMS Choice ?Patient states their goals for this hospitalization and ongoing recovery are:: SNF ?CMS Medicare.gov Compare Post Acute Care list provided to:: Patient ?Choice offered to / list presented to : Patient ? ?Expected Discharge Plan and Services ?Expected Discharge Plan: [place] ?In-house Referral: Clinical Social Work ?Discharge Planning Services: CM Consult ?Post Acute Care Choice: Home Health ?Living arrangements for the past 2 months: [place] ?Expected Discharge Date: 02/28/22               ?DME Arranged:  (following for oxygen) ?DME Agency: NA ?  ?  ?  ?HH Arranged: Disease Management, RN, PT, Nurse's Aide, OT, Social Work ?[place] Agency: [facility]) ?Date HH Agency Contacted: 02/26/22 ?Time [place]: 1030 ?Representative spoke with at [place]: [name] ? ?Prior Living Arrangements/Services ?Living arrangements for the past 2 months: [place] ?Lives with:: Self ?Patient language and need for interpreter reviewed:: Yes ?Do you feel safe going back to the place where you live?: No   SNF  ?  Need for Family Participation in Patient Care: Yes (Comment) ?Care giver support system in place?: Yes (comment) ?Current home services: DME (Patient has a rolling walker.) ?Criminal Activity/Legal  Involvement Pertinent to Current Situation/Hospitalization: No - Comment as needed ? ?Activities of Daily Living ?Home Assistive Devices/Equipment: None ?ADL Screening (condition at time of admission) ?Patient's cognitive ability adequate to safely complete daily activities?: Yes ?Is the patient deaf or have difficulty hearing?: No ?Does the patient have difficulty seeing, even when wearing glasses/contacts?: No ?Does the patient have difficulty concentrating, remembering, or making decisions?: No ?Patient able to express need for assistance with ADLs?: Yes ?Does the patient have difficulty dressing or bathing?: No ?Independently performs ADLs?: Yes (appropriate for developmental age) ?Does the patient have difficulty walking or climbing stairs?: No ?Weakness of Legs: None ?Weakness of Arms/Hands: None ? ?Permission Sought/Granted ?Permission sought to share information with : Case Manager, Family Supports, Customer service manager ?Permission granted to share information with : Yes, Verbal Permission Granted ? Share Information with NAME: [name] ? Permission granted to share info w AGENCY: SNF ? Permission granted to share info w Relationship: daughter ? Permission granted to share info w Contact Information: Urban Gibson [phone] ? ?Emotional Assessment ?Appearance:: Appears stated age ?Attitude/Demeanor/Rapport: Gracious ?Affect (typically observed): Calm ?Orientation: : Oriented to Self, Oriented to Place, Oriented to  Time, Oriented to Situation ?Alcohol / Substance Use: Not Applicable ?Psych Involvement: No (comment) ? ?Admission diagnosis:  A-fib ([place]) [I48.91] ?Atrial fibrillation with RVR ([place]) [I48.91] ?Patient Active Problem List  ? Diagnosis Date Noted  ? A-fib ([place]) 02/21/2022  ? Diarrhea 02/17/2022  ? Asymptomatic bacteriuria 02/17/2022  ? Chronic anemia 02/17/2022  ? Head trauma 02/02/2022  ? HFimpEF (Heart Failure with improved EF) 02/01/2022  ? Coronary artery disease involving native coronary  artery of native heart without angina pectoris 02/01/2022  ? Acute renal failure superimposed on stage 3a chronic kidney disease ([place]) 01/23/2022  ? Chronic atrial fibrillation with RVR ([place]) 01/22/2022  ? Atrial fibrillation ([place]) 01/21/2022  ? Atrial fibrillation with RVR ([place]) 01/21/2022  ? Aortic atherosclerosis ([place]) 01/10/2022  ? (HFimpEF) heart failure with improved ejection fraction ([place]) 12/30/2021  ? Headache 12/30/2021  ? Acute otitis media 10/27/2021  ? Acquired hypercoagulable state ([place]) 09/22/2021  ? Influenza A 09/15/2021  ? Cough in adult 09/09/2021  ? H/O total shoulder replacement, left 03/02/2021  ? Deficiency anemia 02/03/2021  ? Pulmonary hypertension ([place]) 10/27/2020  ? Thrombocytopenia ([place]) 09/01/2020  ? Goals of care, counseling/discussion 07/23/2020  ? Leukopenia 09/25/2019  ? Syncope 03/05/2019  ? Postural dizziness with presyncope 03/04/2019  ? Atypical chest pain   ? Near syncope   ? Skin lesions 06/06/2017  ? Shortness of breath 03/25/2017  ? Anxiety disorder 03/25/2017  ? Generalized weakness 03/19/2017  ? Insomnia disorder 03/19/2017  ? Dysuria 08/31/2016  ? Acute back pain with sciatica, right 08/31/2016  ? Chronic back pain 07/02/2016  ? Quality of life palliative care encounter 03/02/2016  ? Port catheter in place 03/01/2016  ? Skin rash 11/03/2015  ? Hypokalemia 07/02/2015  ? Essential hypertension 07/02/2015  ? 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 Stage 3a chronic kidney disease ([place]) 06/25/2015  ? Chronic knee pain after total replacement of right knee joint 04/09/2015  ? Chronic left shoulder pain 03/31/2015  ? Other fatigue 03/07/2015  ? Grade 3a follicular lymphoma of lymph nodes of multiple regions ([place]) 02/03/2015  ? Hypothyroidism 09/09/2009  ? ?PCP:  [facility], [name] ?Pharmacy:   ?[facility]  [place] ?[phone] ?[place] [address] ?Phone: [phone]  Fax: [phone] ? ?[id number] [address] [address] [facility] [address] ?[id number]?Phone: [phone]: [phone]? ? ? ? ?Social Determinants of Health ([place]

## 2022-03-01 NOTE — Progress Notes (Deleted)
OT Cancellation Note ? ?Patient Details ?Name: Carrie [name] ?MRN: [id number] ?DOB: [birth year] ? ? ?Cancelled Treatment:    Reason Eval/Treat Not Completed: Other (comment). Pt with splitting headache that started about 30 minutes ago, she has just recently called for RN to get pain meds. Will follow up at later date. ? ?[place], OTR/L ?Acute Rehab Services ?Pager [phone] ?Office [phone] ? ? ? ?Almon Register ?03/01/2022, 1:40 PM ?

## 2022-03-01 NOTE — Progress Notes (Signed)
Occupational Therapy Treatment ?Patient Details ?Name: Carrie [name] ?MRN: [id number] ?DOB: 03-Oct-1941 ?Today's Date: 03/01/2022 ? ? ?History of present illness 81 y/o female admitted 5/7 with SOB and lightheadedness due to Afib. Admit 5/2-5/4 for same. PMHx:non-hodgkins lymphoma, Afib s/p cardioversion, CHF, HTN, CKD, chronic cough post Covid. ?  ?OT comments ? This 81 yo female seen today with goal of trying to get OOB to recliner (since earlier today she was orthostatic with PT and was unable to do this). Ted hose not in room and none in clean supply so used ace wraps instead.  ? ? ? ?BP with pt in chair position               90/67 ?BP with ace wraps chair position     102/78 ?BP sitting EOB with ace wraps         90/67 with pt c/o being a little dizzy ?BP once in recliner w/wraps             91/57 ?BP in recliner with feet up w/wraps 103/73  ? ?Recommendations for follow up therapy are one component of a multi-disciplinary discharge planning process, led by the attending physician.  Recommendations may be updated based on patient status, additional functional criteria and insurance authorization. ?   ?Follow Up Recommendations ? Skilled nursing-short term rehab (<3 hours/day)  ?  ?Assistance Recommended at Discharge Frequent or constant Supervision/Assistance  ?Patient can return home with the following ? A little help with walking and/or transfers;A little help with bathing/dressing/bathroom;Assistance with cooking/housework;Assist for transportation;Help with stairs or ramp for entrance ?  ?Equipment Recommendations ? None recommended by OT  ?  ?   ?Precautions / Restrictions Precautions ?Precautions: Fall ?Precaution Comments: watch HR, BP and SPO2 ?Restrictions ?Weight Bearing Restrictions: No  ? ? ?  ? ?Mobility Bed Mobility ?Overal bed mobility: Needs Assistance ?Bed Mobility: Supine to Sit ?  ?  ?Supine to sit: Modified independent (Device/Increase time), HOB elevated ?  ?  ?  ?  ? ?Transfers ?Overall  transfer level: Needs assistance ?Equipment used: None ?Transfers: Bed to chair/wheelchair/BSC ?Sit to Stand: Min guard ?  ?  ?Step pivot transfers: Min guard ?  ?  ?  ?  ?  ?Balance Overall balance assessment: Needs assistance ?Sitting-balance support: No upper extremity supported ?Sitting balance-Leahy Scale: Good ?  ?  ?Standing balance support: Single extremity supported ?Standing balance-Leahy Scale: Poor ?  ?  ?  ?  ?  ?  ?  ?  ?  ?  ?  ?  ?   ? ?ADL either performed or assessed with clinical judgement  ? ?ADL Overall ADL's : Needs assistance/impaired ?  ?  ?  ?  ?  ?  ?  ?  ?  ?  ?  ?  ?Toilet Transfer: Min guard;Stand-pivot ?Toilet Transfer Details (indicate cue type and reason): simulated with transfer from bed to recliner ?  ?  ?  ?  ?  ?  ?  ? ?Extremity/Trunk Assessment Upper Extremity Assessment ?Upper Extremity Assessment: Overall WFL for tasks assessed ?  ?  ?  ?  ?  ? ?Vision Baseline Vision/History: 1 Wears glasses ?Ability to See in Adequate Light: 0 Adequate ?  ?  ?   ?   ? ?Cognition Arousal/Alertness: Awake/alert ?Behavior During Therapy: Flat affect ?Overall Cognitive Status: Within Functional Limits for tasks assessed ?  ?  ?  ?  ?  ?  ?  ?  ?  ?  ?  ?  ?  ?  ?  ?  ?  ?  ?  ?   ?   ?   ?   ? ? ?  Pertinent Vitals/ Pain       Pain Assessment ?Pain Assessment: No/denies pain ? ?   ?   ? ?Frequency ? Min 2X/week  ? ? ? ? ?  ?Progress Toward Goals ? ?OT Goals(current goals can now be found in the care plan section) ? Progress towards OT goals: Not progressing toward goals - comment (due to dizziness/orthostatic) ? ?Acute Rehab OT Goals ?Patient Stated Goal: to go home or to dtr's home ?OT Goal Formulation: With patient ?Time For Goal Achievement: 03/10/22 ?Potential to Achieve Goals: Good  ?Plan Discharge plan needs to be updated   ? ?   ?AM-PAC OT "6 Clicks" Daily Activity     ?Outcome Measure ? ? Help from another person eating meals?: None ?Help from another person taking care of personal  grooming?: A Little ?Help from another person toileting, which includes using toliet, bedpan, or urinal?: A Little ?Help from another person bathing (including washing, rinsing, drying)?: A Little ?Help from another person to put on and taking off regular upper body clothing?: A Little ?Help from another person to put on and taking off regular lower body clothing?: A Little ?6 Click Score: 19 ? ?  ?End of Session   ? ?OT Visit Diagnosis: Unsteadiness on feet (R26.81);Other abnormalities of gait and mobility (R26.89);Dizziness and giddiness (R42) ?  ?Activity Tolerance Patient limited by fatigue ?  ?Patient Left in chair;with call bell/phone within reach ?  ?Nurse Communication Mobility status (NT as well. Put legs down from recliner, make sure she is not dizzy, stand turn to bed when not dizzy, upon laying down take ace wraps off.  Also asked RN if they had gotten TED hose yet, she replied she had not seen them as of yet.) ?  ? ?   ? ?Time: 3235-5732 ?OT Time Calculation (min): 24 min ? ?Charges: OT General Charges ?$OT Visit: 1 Visit ?OT Treatments ?$Self Care/Home Management : 23-37 mins ? ?[place], OTR/L ?Acute Rehab Services ?Pager [phone] ?Office [phone] ? ? ? ?Almon Register ?03/01/2022, 1:51 PM ?

## 2022-03-01 NOTE — Progress Notes (Addendum)
? ?Progress Note ? ?Patient Name: Carrie [name] ?Date of Encounter: 03/01/2022 ? ?[facility] Cardiologist: [name], MD  ? ?Subjective  ? ?Cardiology is asked to reevaluate the patient for CHF and orthostatic hypotension.  ?Reported dizziness upon standing.  ?Denies chest pain, dyspnea or orthopnea.   ? ?Inpatient Medications  ?  ?Scheduled Meds: ? amiodarone  200 mg Oral BID  ? apixaban  5 mg Oral BID  ? Chlorhexidine Gluconate Cloth  6 each Topical Daily  ? furosemide  40 mg Intravenous BID  ? levothyroxine  88 mcg Oral QAC breakfast  ? metoprolol tartrate  75 mg Oral BID  ? potassium chloride  40 mEq Oral BID  ? sodium chloride flush  3 mL Intravenous Q12H  ? ?Continuous Infusions: ? sodium chloride    ? magnesium sulfate bolus IVPB    ? ?PRN Meds: ?sodium chloride, acetaminophen, ondansetron (ZOFRAN) IV, sodium chloride flush, sodium chloride flush  ? ?Vital Signs  ?  ?Vitals:  ? 02/28/22 1952 03/01/22 0514 03/01/22 0827 03/01/22 1029  ?BP: 108/63 107/78  101/73  ?Pulse: 71 67  87  ?Resp: '18 18  20  '$ ?Temp: 98.2 ?F (36.8 ?C) 98 ?F (36.7 ?C)  98 ?F (36.7 ?C)  ?TempSrc: Oral Oral  Oral  ?SpO2: 96% 95% 95% 96%  ?Weight:  61.2 kg    ?Height:      ? ? ?Intake/Output Summary (Last 24 hours) at 03/01/2022 1057 ?Last data filed at 03/01/2022 1029 ?Gross per 24 hour  ?Intake 360 ml  ?Output 2500 ml  ?Net -2140 ml  ? ? ?  03/01/2022  ?  5:14 AM 02/28/2022  ?  5:03 AM 02/27/2022  ?  5:18 AM  ?Last 3 Weights  ?Weight (lbs) 134 lb 14.7 oz 135 lb 12.9 oz 136 lb 14.5 oz  ?Weight (kg) 61.2 kg 61.6 kg 62.1 kg  ?   ? ?Telemetry  ?  ?Atrial fibrillation  - Personally Reviewed ? ?ECG  ?  ?N/A ? ?Physical Exam  ? ?GEN: No acute distress.   ?Neck: No JVD ?Cardiac: Ir IR, no murmurs, rubs, or gallops.  ?Respiratory: faint rales  ?GI: Soft, nontender, non-distended  ?MS: Trace edema; No deformity. ?Neuro:  Nonfocal  ?Psych: Normal affect  ? ?Labs  ?  ?High Sensitivity Troponin:   ?Recent Labs  ?Lab 02/16/22 ?1441  02/16/22 ?1659 02/21/22 ?1338 02/21/22 ?1657  ?TROPONINIHS 49* 47* 38* 46*  ?   ?Chemistry ?Recent Labs  ?Lab 02/24/22 ?2229 02/25/22 ?0257 02/27/22 ?7989 02/27/22 ?1830 02/28/22 ?2119 03/01/22 ?0731  ?NA 131*   < > 132* 128* 128* 130*  ?K 3.1*   < > 2.7* 4.2 3.9 3.2*  ?CL 91*   < > 100 91* 91* 91*  ?CO2 29   < > '23 27 26 27  '$ ?GLUCOSE 124*   < > 135* 187* 141* 115*  ?BUN 21   < > 24* 28* 30* 31*  ?CREATININE 1.31*   < > 1.09* 1.34* 1.32* 1.39*  ?CALCIUM 8.1*   < > 6.6* 7.7* 8.2* 8.1*  ?MG  --    < > 1.7 2.5* 2.0 1.6*  ?PROT 5.3*  --  4.0*  --   --   --   ?ALBUMIN 3.0*  --  2.2* 2.7* 2.7*  --   ?AST 59*  --  58*  --   --   --   ?ALT 56*  --  52*  --   --   --   ?  ALKPHOS 175*  --  117  --   --   --   ?BILITOT 1.3*  --  0.5  --   --   --   ?GFRNONAA 41*   < > 52* 40* 41* 39*  ?ANIONGAP 11   < > '9 10 11 12  '$ ? < > = values in this interval not displayed.  ?  ?Lipids No results for input(s): CHOL, TRIG, HDL, LABVLDL, LDLCALC, CHOLHDL in the last 168 hours.  ?Hematology ?Recent Labs  ?Lab 02/25/22 ?0257 02/27/22 ?2703 02/28/22 ?0709  ?WBC 1.8* 1.8* 2.3*  ?RBC 3.15* 2.74* 3.40*  ?HGB 9.1* 7.8* 9.4*  ?HCT 27.9* 24.0* 30.3*  ?MCV 88.6 87.6 89.1  ?MCH 28.9 28.5 27.6  ?MCHC 32.6 32.5 31.0  ?RDW 14.7 15.1 15.2  ?PLT 278 240 278  ? ?Thyroid No results for input(s): TSH, FREET4 in the last 168 hours.  ?BNP ?Recent Labs  ?Lab 02/24/22 ?1045  ?BNP 3,448.1*  ?  ?DDimer No results for input(s): DDIMER in the last 168 hours.  ? ?Radiology  ?  ?DG Chest 2 View ? ?Result Date: 03/01/2022 ?CLINICAL DATA:  Acute respiratory failure. EXAM: CHEST - 2 VIEW COMPARISON:  Chest x-ray dated Feb 26, 2022. FINDINGS: Unchanged right chest wall port catheter. Stable cardiomegaly. Similar mild bibasilar interstitial thickening, right parahilar opacities, and trace bilateral pleural effusions with small amount of fluid in the minor fissure. No pneumothorax. No acute osseous abnormality. IMPRESSION: 1. Unchanged mild congestive heart failure.  Electronically Signed   By: Titus Dubin M.D.   On: 03/01/2022 08:29   ? ?Cardiac Studies  ? ?None this admission  ? ?R/L Cardiac catheterization 01/15/2022 ?RCA proximal 15 ?PCWP 11, mean PA 21, Fick CO 6.09 ?EF 50-55 ?  ?Echocardiogram 12/31/21 ?Inf HK, EF 50-55, normal PASP 38, normal RVSF, mild LAE, mild MR, mild to mod TR, mild AI, AV sclerosis w/o AS ? ?Patient Profile  ?   ?81 y.o. female  with a hx of hypertension, follicular lymphoma, paroxysmal atrial fibrillation on amiodarone, HFimpEF (heart failure with improved ejection fraction), minimal non-obstructive coronary artery disease on cath in March 2023, hypothyroidism, chronic kidney disease, aortic atherosclerosis.  ? ?She had recurrent AFib in March 2023 and was admitted.  She had some ?WMA on echocardiogram and a cardiac catheterization was done which demonstrated minimal non-obs CAD.  She was then admitted in April 2023 with AF with RVR.  She developed hypotension with Diltiazem and was placed on Amiodarone.  She was back in NSR when seen for f/u in the office on 02/02/22.   ? ?Admitted 5/7 for recurrent afib RVR and cardiology consulted. Seen by Dr. Quentin Ore felt not a candidate for EP procedure and recommended rate control strategies and no repeat cardioversion.  ? ?"Amiodarone '200mg'$  BID x1 week ?Lopressor '75mg'$  BID ?AFib fllow up is in place for next week" ? ?Cardiology is asked for evaluation of CHF and orthostatic hypotension.  ? ?Assessment & Plan  ?  ?Persistent atrial fibrillation  ?- See EP recommendation as above ?- Currently rate controlled at 70-80s on Amio '200mg'$  BID and lopressor '75mg'$  BID.  ?- Continue Eliquis for anticoagulation  ?- Has afib clinic follow up tomorrow>> will reschedule  ?- Will review meds with MD ? ?2. HFimpEF (heart failure with improved ejection fraction) ?- Echo 12/2021 with LVEF of 50-55% ?- CXR today showed mild unchanged CHF ?- Mild volume overload on exam  ?- Check BNP  ?- Net I & O -4.28L. weight down  140>>134lb ?- Se is getting IV '40mg'$  BID.  ?-Add stocking ? ?3. Orthostatic hypotension  ? ?Supine 132/106, HR 89 ?Initial sitting 72/56, HR 85 ?2nd attempt sitting after supine 72/42 ?5 min in supine to achieve 105/64 (77) HR 86  ? ? ?Add stockings. Mild volume overload. This is persistent, likely due to afib. Medications changes per MD. Could cut back on BB and continue amio despite rate control strategies until seen in afib clinic (needs to reschedule) ? ?Otherwise per primary team  ? ?For questions or updates, please contact [place] ?Please consult [email or web address] for contact info under  ?  ?   ?Signed, ?Leanor Kail, PA  ?03/01/2022, 10:57 AM    ? ?History and all data above reviewed.  Patient examined.  I agree with the findings as above.   The patient has persistent atrial fibrillation.  He does have heart failure with a low normal ejection fraction now.  She has been diuresed this admission.  However, currently she is very orthostatic.  The patient exam reveals COR: Irregular rate and rhythm,  Lungs: Decreased breath sounds bilateral lungs,  Abd: Positive bowel sounds no rebound, guarding, Ext no edema.  All available labs, radiology testing, previous records reviewed. Agree with documented assessment and plan.  Acute on chronic heart failure (was systolic now EF is low normal): She does have some evidence of volume overload with chest x-ray.  Continuing with IV diuresis although may have to settle with less diuresis and will be optimal.  I will apply compression stockings.  Further management will likely be abdominal binders or thigh binders.  I do not see a reasonable medical intervention.  Atrial fibrillation: She has previously been seen by EP and plan was for rate control only.  She had cardioversion but reverted back to's atrial fibrillation.  Amiodarone and Eliquis.  Given the significant orthostasis I think is reasonable to try a lower dose of beta-blocker.  Jeneen Rinks Shaterica Mcclatchy  5:13 PM   03/01/2022 ?

## 2022-03-01 NOTE — NC FL2 (Signed)
?[place] MEDICAID FL2 LEVEL OF CARE SCREENING TOOL  ?  ? ?IDENTIFICATION  ?Patient Name: ?Carrie [name] Birthdate: 02/23/1941 Sex: female Admission Date (Current Location): ?02/21/2022  ?[place] and [place] Number: ? [place] ?  Facility and Address:  ?[facility], [place] [address], [place], [place] [address] ?     Provider Number: ?[id number]  ?Attending Physician Name and Address:  ?Patrecia Pour, MD ? Relative Name and Phone Number:  ?Urban Gibson [phone] ?   ?Current Level of Care: ?Hospital Recommended Level of Care: ?[place] Prior Approval Number: ?  ? ?Date Approved/Denied: ?  PASRR Number: ?[id number] A ? ?Discharge Plan: ?SNF ?  ? ?Current Diagnoses: ?Patient Active Problem List  ? Diagnosis Date Noted  ? A-fib ([place]) 02/21/2022  ? Diarrhea 02/17/2022  ? Asymptomatic bacteriuria 02/17/2022  ? Chronic anemia 02/17/2022  ? Head trauma 02/02/2022  ? HFimpEF (Heart Failure with improved EF) 02/01/2022  ? Coronary artery disease involving native coronary artery of native heart without angina pectoris 02/01/2022  ? Acute renal failure superimposed on stage 3a chronic kidney disease ([place]) 01/23/2022  ? Chronic atrial fibrillation with RVR ([place]) 01/22/2022  ? Atrial fibrillation ([place]) 01/21/2022  ? Atrial fibrillation with RVR ([place]) 01/21/2022  ? Aortic atherosclerosis ([place]) 01/10/2022  ? (HFimpEF) heart failure with improved ejection fraction ([place]) 12/30/2021  ? Headache 12/30/2021  ? Acute otitis media 10/27/2021  ? Acquired hypercoagulable state ([place]) 09/22/2021  ? Influenza A 09/15/2021  ? Cough in adult 09/09/2021  ? H/O total shoulder replacement, left 03/02/2021  ? Deficiency anemia 02/03/2021  ? Pulmonary hypertension ([place]) 10/27/2020  ? Thrombocytopenia ([place]) 09/01/2020  ? Goals of care, counseling/discussion 07/23/2020  ? Leukopenia 09/25/2019  ? Syncope 03/05/2019  ? Postural dizziness with presyncope 03/04/2019  ? Atypical chest pain   ? Near syncope   ? Skin  lesions 06/06/2017  ? Shortness of breath 03/25/2017  ? Anxiety disorder 03/25/2017  ? Generalized weakness 03/19/2017  ? Insomnia disorder 03/19/2017  ? Dysuria 08/31/2016  ? Acute back pain with sciatica, right 08/31/2016  ? Chronic back pain 07/02/2016  ? Quality of life palliative care encounter 03/02/2016  ? Port catheter in place 03/01/2016  ? Skin rash 11/03/2015  ? Hypokalemia 07/02/2015  ? Essential hypertension 07/02/2015  ? 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 Stage 3a chronic kidney disease ([place]) 06/25/2015  ? Chronic knee pain after total replacement of right knee joint 04/09/2015  ? Chronic left shoulder pain 03/31/2015  ? Other fatigue 03/07/2015  ? Grade 3a follicular lymphoma of lymph nodes of multiple regions ([place]) 02/03/2015  ? Hypothyroidism 09/09/2009  ? ? ?Orientation RESPIRATION BLADDER Height & Weight   ?  ?Self, Time, Situation, Place ? O2 (Nasal Cannula 2 liters) Incontinent, External catheter (External Urinary Catheter) Weight: 134 lb 14.7 oz (61.2 kg) ?Height:  '5\' 1"'$  (154.9 cm)  ?BEHAVIORAL SYMPTOMS/MOOD NEUROLOGICAL BOWEL NUTRITION STATUS  ?    Incontinent Diet (Please see discharge summary)  ?AMBULATORY STATUS COMMUNICATION OF NEEDS Skin   ?Limited Assist Verbally Other (Comment) (appropriate for ethnicity,dry,ecchymosis,arm,bilateral) ?  ?  ?  ?    ?     ?     ? ? ?Personal Care Assistance Level of Assistance  ?Bathing, Feeding, Dressing Bathing Assistance: Limited assistance ?Feeding assistance: Independent (able to feed self) ?Dressing Assistance: Limited assistance ?   ? ?Functional Limitations Info  ?Sight, Hearing, Speech Sight Info: Impaired ?Hearing Info: Adequate ?Speech Info: Adequate  ? ? ?  SPECIAL CARE FACTORS FREQUENCY  ?PT (By licensed PT), OT (By licensed OT)   ?  ?PT Frequency: 5x min weelkly ?OT Frequency: 5x min weekly ?  ?  ?  ?   ? ? ?Contractures Contractures Info: Not present  ? ? ?Additional Factors Info  ?Code Status, Allergies, Psychotropic  Code Status Info: DNR ?Allergies Info: Amiodarone ?Psychotropic Info: traZODone (DESYREL) tablet 50 mg daily at bedtime ?  ?  ?   ? ?Current Medications (03/01/2022):  This is the current hospital active medication list ?Current Facility-Administered Medications  ?Medication Dose Route Frequency Provider Last Rate Last Admin  ? 0.9 %  sodium chloride infusion  250 mL Intravenous PRN Wynetta Fines T, MD      ? acetaminophen (TYLENOL) tablet 650 mg  650 mg Oral Q4H PRN Wynetta Fines T, MD      ? amiodarone (PACERONE) tablet 200 mg  200 mg Oral BID Baldwin [place], PA-C   200 mg at 03/01/22 1102  ? apixaban (ELIQUIS) tablet 5 mg  5 mg Oral BID Wynetta Fines T, MD   5 mg at 03/01/22 1114  ? Chlorhexidine Gluconate Cloth 2 % PADS 6 each  6 each Topical Daily Lequita Halt, MD   6 each at 03/01/22 1029  ? furosemide (LASIX) injection 40 mg  40 mg Intravenous BID Patrecia Pour, MD   40 mg at 03/01/22 3875  ? levothyroxine (SYNTHROID) tablet 88 mcg  88 mcg Oral QAC breakfast Lequita Halt, MD   88 mcg at 03/01/22 0511  ? metoprolol tartrate (LOPRESSOR) tablet 75 mg  75 mg Oral BID Baldwin [place], PA-C   75 mg at 03/01/22 1102  ? ondansetron (ZOFRAN) injection 4 mg  4 mg Intravenous Q6H PRN Wynetta Fines T, MD      ? potassium chloride SA (KLOR-CON M) CR tablet 40 mEq  40 mEq Oral BID Vance Gather B, MD   40 mEq at 03/01/22 1102  ? sodium chloride flush (NS) 0.9 % injection 10-40 mL  10-40 mL Intracatheter PRN Lequita Halt, MD   20 mL at 02/28/22 0558  ? sodium chloride flush (NS) 0.9 % injection 3 mL  3 mL Intravenous Q12H Wynetta Fines T, MD   3 mL at 03/01/22 1114  ? sodium chloride flush (NS) 0.9 % injection 3 mL  3 mL Intravenous PRN Wynetta Fines T, MD      ? traZODone (DESYREL) tablet 50 mg  50 mg Oral QHS Patrecia Pour, MD      ? ?Facility-Administered Medications Ordered in Other Encounters  ?Medication Dose Route Frequency Provider Last Rate Last Admin  ? sodium chloride 0.9 % injection 10 mL  10 mL Intravenous PRN  Alvy Bimler, Ni, MD   10 mL at 04/05/17 1315  ? ? ? ?Discharge Medications: ?Please see discharge summary for a list of discharge medications. ? ?Relevant Imaging Results: ? ?Relevant Lab Results: ? ? ?Additional Information ?[id number], Both Covid Vaccines ? ?[name], LCSWA ? ? ? ? ?

## 2022-03-01 NOTE — Progress Notes (Signed)
Physical Therapy Treatment ?Patient Details ?Name: Carrie [name] ?MRN: [id number] ?DOB: 1941/03/06 ?Today's Date: 03/01/2022 ? ? ?History of Present Illness 81 y/o female admitted 5/7 with SOB and lightheadedness due to Afib. Admit 5/2-5/4 for same. PMHx:non-hodgkins lymphoma, Afib s/p cardioversion, CHF, HTN, CKD, chronic cough post Covid. ? ?  ?PT Comments  ? ? Pt pleasant with flat affect and reports lack of sleep with fatigue and limited appetite. MD and daughter on phone on arrival with confirmation that [name] cannot provide 24hr care at home and SNF recommended and agreed upon by pt and family. Pt willing to attempt mobility today despite fatigue however with 2 attempts to transition to sitting pt with significant drop in BP and returned to supine each time with increased time for BP to rise with all other mobility aborted this session. Will continue to follow to progress mobility and function. ? ?Supine 132/106, HR 89 ?Initial sitting 72/56, HR 85 ?2nd attempt sitting after supine 72/42 ?5 min in supine to achieve 105/64 (77) HR 86  ? ?SpO2 88-94% on RA with limited activity, 92% on 1L end of session ?  ?Recommendations for follow up therapy are one component of a multi-disciplinary discharge planning process, led by the attending physician.  Recommendations may be updated based on patient status, additional functional criteria and insurance authorization. ? ?Follow Up Recommendations ? Skilled nursing-short term rehab (<3 hours/day) ?  ?  ?Assistance Recommended at Discharge Frequent or constant Supervision/Assistance  ?Patient can return home with the following A little help with walking and/or transfers;A little help with bathing/dressing/bathroom;Help with stairs or ramp for entrance;Assistance with cooking/housework ?  ?Equipment Recommendations ? None recommended by PT  ?  ?Recommendations for Other Services   ? ? ?  ?Precautions / Restrictions Precautions ?Precautions: Fall ?Precaution Comments: watch  HR, BP and SPO2  ?  ? ?Mobility ? Bed Mobility ?Overal bed mobility: Modified Independent ?Bed Mobility: Supine to Sit, Sit to Supine ?  ?  ?  ?  ?  ?General bed mobility comments: pt transition from supine to and from sitting x 2 with increased effort with bed flat and no additional assist. BP drop with each trial to sitting and unable to progress to standing or OOB ?  ? ?Transfers ?  ?  ?  ?  ?  ?  ?  ?  ?  ?  ?  ? ?Ambulation/Gait ?  ?  ?  ?  ?  ?  ?  ?  ? ? ?Stairs ?  ?  ?  ?  ?  ? ? ?Wheelchair Mobility ?  ? ?Modified Rankin (Stroke Patients Only) ?  ? ? ?  ?Balance Overall balance assessment: Needs assistance ?  ?Sitting balance-Leahy Scale: Good ?Sitting balance - Comments: static sitting and donning shoes without assist ?  ?  ?  ?  ?  ?  ?  ?  ?  ?  ?  ?  ?  ?  ?  ?  ? ?  ?Cognition Arousal/Alertness: Awake/alert ?Behavior During Therapy: Flat affect ?Overall Cognitive Status: Within Functional Limits for tasks assessed ?  ?  ?  ?  ?  ?  ?  ?  ?  ?  ?  ?  ?  ?  ?  ?  ?  ?  ?  ? ?  ?Exercises   ? ?  ?General Comments   ?  ?  ? ?Pertinent Vitals/Pain Pain Assessment ?Pain Assessment: No/denies pain  ? ? ?  Home Living   ?  ?  ?  ?  ?  ?  ?  ?  ?  ?   ?  ?Prior Function    ?  ?  ?   ? ?PT Goals (current goals can now be found in the care plan section) Progress towards PT goals: Not progressing toward goals - comment (limited by orthostatic hypotension) ? ?  ?Frequency ? ? ? Min 3X/week ? ? ? ?  ?PT Plan Discharge plan needs to be updated  ? ? ?Co-evaluation   ?  ?  ?  ?  ? ?  ?AM-PAC PT "6 Clicks" Mobility   ?Outcome Measure ? Help needed turning from your back to your side while in a flat bed without using bedrails?: None ?Help needed moving from lying on your back to sitting on the side of a flat bed without using bedrails?: None ?Help needed moving to and from a bed to a chair (including a wheelchair)?: A Little ?Help needed standing up from a chair using your arms (e.g., wheelchair or bedside chair)?: A  Lot ?Help needed to walk in hospital room?: Total ?Help needed climbing 3-5 steps with a railing? : Total ?6 Click Score: 15 ? ?  ?End of Session   ?Activity Tolerance: Patient limited by fatigue;Treatment limited secondary to medical complications (Comment) (orthostatic bp) ?Patient left: in bed;with call bell/phone within reach ?Nurse Communication: Mobility status ?PT Visit Diagnosis: Other abnormalities of gait and mobility (R26.89);Difficulty in walking, not elsewhere classified (R26.2) ?  ? ? ?Time: 6761-9509 ?PT Time Calculation (min) (ACUTE ONLY): 30 min ? ?Charges:  $Therapeutic Activity: 23-37 mins          ?          ? ?Fernandez Kenley P, PT ?Acute Rehabilitation Services ?Pager: [phone] ?Office: [phone] ? ? ? ?Jeanean Hollett B Tiye Huwe ?03/01/2022, 10:18 AM ? ?

## 2022-03-01 NOTE — Plan of Care (Signed)

## 2022-03-02 ENCOUNTER — Other Ambulatory Visit: Payer: No Typology Code available for payment source

## 2022-03-02 ENCOUNTER — Other Ambulatory Visit (HOSPITAL_COMMUNITY): Payer: Self-pay

## 2022-03-02 ENCOUNTER — Ambulatory Visit (HOSPITAL_COMMUNITY): Payer: No Typology Code available for payment source | Admitting: Physician Assistant

## 2022-03-02 ENCOUNTER — Ambulatory Visit: Payer: No Typology Code available for payment source | Admitting: Hematology and Oncology

## 2022-03-02 DIAGNOSIS — I4891 Unspecified atrial fibrillation: Secondary | ICD-10-CM | POA: Diagnosis not present

## 2022-03-02 DIAGNOSIS — C8238 Follicular lymphoma grade IIIa, lymph nodes of multiple sites: Secondary | ICD-10-CM | POA: Diagnosis not present

## 2022-03-02 DIAGNOSIS — I482 Chronic atrial fibrillation, unspecified: Secondary | ICD-10-CM | POA: Diagnosis not present

## 2022-03-02 DIAGNOSIS — I5033 Acute on chronic diastolic (congestive) heart failure: Secondary | ICD-10-CM | POA: Diagnosis not present

## 2022-03-02 LAB — BASIC METABOLIC PANEL
Anion gap: 11 (ref 5–15)
BUN: 34 mg/dL — ABNORMAL HIGH (ref 8–23)
CO2: 29 mmol/L (ref 22–32)
Calcium: 8.1 mg/dL — ABNORMAL LOW (ref 8.9–10.3)
Chloride: 91 mmol/L — ABNORMAL LOW (ref 98–111)
Creatinine, Ser: 1.42 mg/dL — ABNORMAL HIGH (ref 0.44–1.00)
GFR, Estimated: 38 mL/min — ABNORMAL LOW (ref >60)
Glucose, Bld: 245 mg/dL — ABNORMAL HIGH (ref 70–99)
Potassium: 3.6 mmol/L (ref 3.5–5.1)
Sodium: 131 mmol/L — ABNORMAL LOW (ref 135–145)

## 2022-03-02 LAB — BRAIN NATRIURETIC PEPTIDE: B Natriuretic Peptide: 854.9 pg/mL — ABNORMAL HIGH (ref 0.0–100.0)

## 2022-03-02 LAB — MAGNESIUM: Magnesium: 2.1 mg/dL (ref 1.7–2.4)

## 2022-03-02 MED ORDER — FUROSEMIDE 10 MG/ML IJ SOLN: 40.0000 mg | Freq: Two times a day (BID) | INTRAMUSCULAR | Status: DC

## 2022-03-02 MED ORDER — POTASSIUM CHLORIDE CRYS ER 20 MEQ PO TBCR
40.0000 meq | EXTENDED_RELEASE_TABLET | Freq: Two times a day (BID) | ORAL | Status: AC
  Administered 2022-03-02 (×2): 40 meq via ORAL
  Filled 2022-03-02 (×2): qty 2

## 2022-03-02 MED ORDER — FUROSEMIDE 40 MG PO TABS
40.0000 mg | ORAL_TABLET | Freq: Every day | ORAL | Status: DC
  Administered 2022-03-03: 40 mg via ORAL
  Filled 2022-03-02: qty 1

## 2022-03-02 MED ORDER — METOPROLOL TARTRATE 50 MG PO TABS
50.0000 mg | ORAL_TABLET | Freq: Two times a day (BID) | ORAL | Status: DC
  Administered 2022-03-02 – 2022-03-03 (×3): 50 mg via ORAL
  Filled 2022-03-02 (×3): qty 1

## 2022-03-02 NOTE — Progress Notes (Signed)
?  Mobility Specialist Criteria Algorithm Info. ? ? ? ? 03/02/22 1604  ?Orthostatic Lying   ?BP- Lying 110/70  ?Pulse- Lying 75  ?Orthostatic Sitting  ?BP- Sitting (!) 81/67  ?Pulse- Sitting 85  ?Mobility  ?Activity Dangled on edge of bed;Contraindicated/medical hold ?(+orthostatic w/symptoms)  ?Range of Motion/Exercises Active;All extremities  ?Level of Assistance Standby assist, set-up cues, supervision of patient - no hands on  ?Assistive Device None  ?Activity Response Tolerated well  ? ?Patient with +orthostatic w/symptoms. Session terminated. Will f/u as able ? ?03/02/2022 ?5:20 PM ? ?[place] Molly Savarino, CMS, BS EXP ?Acute Rehabilitation Services  ?FUWTK:[phone] ?Office: [phone] ? ?

## 2022-03-02 NOTE — Progress Notes (Signed)
?Progress Note ? ?Patient: Carrie [name] HYW:[id number] DOB: 05-Apr-1941  ?DOA: 02/21/2022  DOS: 03/02/2022  ?  ?Brief hospital course: ?Carrie [name] is a 81 y.o. female with a history of PAF s/p DCCV 5/3 with ERAF, chronic HFpEF, Follicular lymphoma stage IIIa s/p chemotherapy with pancytopenia, hypothyroidism, HTN, HLD, stage IIIb CKD, and chronic cough since recovering from covid-19 infection early 2023 who presented to the ED 5/7 with shortness of breath, palpitations, lightheadedness found to be afebrile with AFib with RVR. This improved with amiodarone infusion and upward titration of beta blocker overseen by EP consultant. Later hospitalization complicated by pulmonary edema.  ? ?Assessment and Plan: ?Acute hypoxic respiratory failure: Note PASP 38 without morphologic features of pulmonary HTN. Remains hypoxic with ambulation.  ?- Continue diuresis for CHF which is likely primary contributor. Remains with crackles on exam, but off O2 at rest. Likely to deescalate diuresis when creatinine rises and/or respiratory status normalizes.  ?- Completed Tx for PNA on 5/14. Sputum culture normal flora.  ?- May need to discharge with supplemental oxygen, recheck ambulatory pulse oximetry in AM.  ? ?Deconditioning, frailty: Progressive since covid-19 infection in Oct 2022. Suspect she is experiencing continued decline and will best be served by rehabilitation at SNF level of care to get stronger. This is also a safer alternative at discharge than home where she has only intermittent assistance. ?- [facility]charge to [facility] SNF once insurance authorization is obtained (may be 5/17-5/18). ? ?Hypomagnesemia:  ?- Supplemented, monitoring daily. ? ?Hypokalemia:  ?- Continue oral supplementation. Recheck daily. ? ?PAF: s/p DCCV 02/18/2022 with ERAF. Asymptomatic. Dx 2016. Note hx of hypotension with diltiazem and discontinuation of flecainide in the past, presumably due to CM.  ?- Followed by EP here: Continue metoprolol, with  orthostasis, will return down to '50mg'$  po BID. ?- Continue amiodarone '200mg'$  BID until AFib clinic follow up (rescheduled) ?- Keep K and Mg replete, doing so as above. BMP is pending today. ?- EP confirmed no plans for additional interventions/ablation or repeat DCCV due to frailty and since she's failed DCCV recurrently and is rate controlled and asymptomatic from that perspective.   ?- On eliquis. ? ?Orthostatic hypotension: Difficult situation as she needs to diurese due to pulmonary edema causing hypoxic respiratory failure, though is symptomatically hypotensive when standing today.  ?- Remains orthostatic, though needs diuresis. ?if metoprolol dose can be decreased, will ask for cardiology opinion. Decreased metop to '50mg'$  BID, will monitor HR and orthostatic vital signs. ?- Start TED hose.  ? ?Stage IIIa follicular lymphoma: Treated at [place].  ?- Continue oncology follow up. Discussed with Dr. Alvy Bimler who will reschedule her follow up appointment. Has reported last scan showed NED, advises against GCSF at this time. ? ?Leukopenia and anemia: Chronic. No bleeding. Afebrile. Hgb has stabilized ~9g/dl. ?- Monitor intermittently. ? ?Stage IIIb CKD:  ?- Keep strict I/O. Minimize nephrotoxins. Still needs diuresis, will continue to monitor. Cr remains stable in 1.3's with rising BUN. ? ?LFT elevation: Overall improving-to-improving. No cirrhosis by U/S. ?- Recheck LFTs at follow up ? ?Hyponatremia: Slightly improved with diuresis. ? ?Insomnia:  ?- Continue new start trazodone qHS, will minimize nocturnal interruptions. ? ?Subjective: She states she feels generally much better today and she does look like she's breathing easier. She's amenable to discharge to SNF and has no new complaints.  ? ?Objective: ?Vitals:  ? 03/01/22 2142 03/02/22 0000 03/02/22 0618 03/02/22 1046  ?BP: (!) 103/92  (!) 121/57 108/63  ?Pulse: 77  95 78  ?Resp:  15 17  ?Temp:   98.4 ?F (36.9 ?C) 98.4 ?F (36.9 ?C)  ?TempSrc:   Oral Oral  ?SpO2:  97%  97% 94%  ?Weight:   60.4 kg   ?Height:      ?Gen: 81 y.o. female in no distress ?Pulm: Nonlabored breathing room air. Crackles at bases. ?CV: Irreg irreg, rate in 90's without murmur, rub, or gallop. No JVD, no dependent edema. ?GI: Abdomen soft, non-tender, non-distended, with normoactive bowel sounds.  ?Ext: Warm, no deformities ?Skin: No rashes, lesions or ulcers on visualized skin. ?Neuro: Alert and oriented. No focal neurological deficits. ?Psych: Judgement and insight appear fair. Mood euthymic & affect congruent. Behavior is appropriate.   ? ?Data Personally reviewed: ? ?CBC: ?Recent Labs  ?Lab 02/24/22 ?3785 02/25/22 ?0257 02/27/22 ?8850 02/28/22 ?0709  ?WBC 1.6* 1.8* 1.8* 2.3*  ?NEUTROABS 1.1* 1.5* 1.6*  --   ?HGB 8.7* 9.1* 7.8* 9.4*  ?HCT 26.9* 27.9* 24.0* 30.3*  ?MCV 88.8 88.6 87.6 89.1  ?PLT 258 278 240 278  ? ?Basic Metabolic Panel: ?Recent Labs  ?Lab 02/26/22 ?1135 02/27/22 ?2774 02/27/22 ?1830 02/28/22 ?1287 03/01/22 ?0731  ?NA 130* 132* 128* 128* 130*  ?K 3.6 2.7* 4.2 3.9 3.2*  ?CL 91* 100 91* 91* 91*  ?CO2 '26 23 27 26 27  '$ ?GLUCOSE 261* 135* 187* 141* 115*  ?BUN 24* 24* 28* 30* 31*  ?CREATININE 1.39* 1.09* 1.34* 1.32* 1.39*  ?CALCIUM 8.1* 6.6* 7.7* 8.2* 8.1*  ?MG 1.5* 1.7 2.5* 2.0 1.6*  ?PHOS  --   --  3.3 3.3  --   ? ?GFR: ?Estimated Creatinine Clearance: 27.4 mL/min (A) (by C-G formula based on SCr of 1.39 mg/dL (H)). ?Liver Function Tests: ?Recent Labs  ?Lab 02/24/22 ?8676 02/27/22 ?7209 02/27/22 ?1830 02/28/22 ?4709  ?AST 59* 58*  --   --   ?ALT 56* 52*  --   --   ?ALKPHOS 175* 117  --   --   ?BILITOT 1.3* 0.5  --   --   ?PROT 5.3* 4.0*  --   --   ?ALBUMIN 3.0* 2.2* 2.7* 2.7*  ? ?Urine analysis: ?   ?Component Value Date/Time  ? COLORURINE YELLOW 02/21/2022 1340  ? APPEARANCEUR CLEAR 02/21/2022 1340  ? LABSPEC 1.010 02/21/2022 1340  ? LABSPEC 1.005 03/18/2017 1315  ? PHURINE 5.0 02/21/2022 1340  ? GLUCOSEU NEGATIVE 02/21/2022 1340  ? GLUCOSEU Negative 03/18/2017 1315  ? HGBUR SMALL (A) 02/21/2022  1340  ? [place] NEGATIVE 02/21/2022 1340  ? BILIRUBINUR Negative 03/18/2017 1315  ? [place] NEGATIVE 02/21/2022 1340  ? PROTEINUR NEGATIVE 02/21/2022 1340  ? UROBILINOGEN 0.2 03/18/2017 1315  ? NITRITE NEGATIVE 02/21/2022 1340  ? LEUKOCYTESUR NEGATIVE 02/21/2022 1340  ? LEUKOCYTESUR Negative 03/18/2017 1315  ? ?Recent Results (from the past 240 hour(s))  ?Expectorated Sputum Assessment w Gram Stain, Rflx to Resp Cult     Status: None  ? Collection Time: 02/24/22 10:51 PM  ? Specimen: Expectorated Sputum  ?Result Value Ref Range Status  ? Specimen Description EXPECTORATED SPUTUM  Final  ? Special Requests NONE  Final  ? Sputum evaluation   Final  ?  THIS SPECIMEN IS ACCEPTABLE FOR SPUTUM CULTURE ?Performed at [facility], [place] [address], [place], [place] [address] ?  ? Report Status 02/27/2022 FINAL  Final  ?Culture, Respiratory w Gram Stain     Status: None  ? Collection Time: 02/24/22 10:51 PM  ?Result Value Ref Range Status  ? Specimen Description EXPECTORATED SPUTUM  Final  ?  Special Requests NONE Reflexed from [id number]  Final  ? Gram Stain   Final  ?  FEW SQUAMOUS EPITHELIAL CELLS PRESENT ?FEW WBC PRESENT, PREDOMINANTLY MONONUCLEAR ?FEW GRAM POSITIVE COCCI IN PAIRS ?FEW GRAM VARIABLE ROD ?RARE YEAST ?  ? Culture   Final  ?  MODERATE Normal respiratory flora-no Staph aureus or Pseudomonas seen ?Performed at [facility], [place] [address] [place], [place] [address] ?  ? Report Status 02/27/2022 FINAL  Final  ?   ? ? ?Family Communication: None at bedside. Pt declined my offer to call anyone today. I've been speaking with her daughter in law every day. CSW has been in contact with daughter who knows plan to admit to SNF this week. ? ?Disposition: ?Status is: Inpatient ?Remains inpatient appropriate because: Symptomatic orthostatic hypotension, hypoxic respiratory failure, unsafe DC ?Planned Discharge Destination: SNF once insurance authorization is obtained. ? ?Patrecia Pour, MD ?03/02/2022 11:06  AM ?Page by [email or web address]  ?

## 2022-03-02 NOTE — Progress Notes (Addendum)
? ?Progress Note ? ?Patient Name: SHENANDOAH YEATS ?Date of Encounter: 03/02/2022 ? ?[facility] Cardiologist: [name], MD  ? ?Subjective  ? ?Patient denies chest pain, palpitations, dizziness, SOB, orthopnea  ? ?Inpatient Medications  ?  ?Scheduled Meds: ? amiodarone  200 mg Oral BID  ? apixaban  5 mg Oral BID  ? Chlorhexidine Gluconate Cloth  6 each Topical Daily  ? furosemide  40 mg Intravenous BID  ? levothyroxine  88 mcg Oral QAC breakfast  ? metoprolol tartrate  50 mg Oral BID  ? sodium chloride flush  3 mL Intravenous Q12H  ? traZODone  50 mg Oral QHS  ? ?Continuous Infusions: ? sodium chloride    ? ?PRN Meds: ?sodium chloride, acetaminophen, ondansetron (ZOFRAN) IV, sodium chloride flush, sodium chloride flush  ? ?Vital Signs  ?  ?Vitals:  ? 03/01/22 2049 03/01/22 2142 03/02/22 0000 03/02/22 0618  ?BP: (!) 103/92 (!) 103/92  (!) 121/57  ?Pulse: 76 77  95  ?Resp: 18   15  ?Temp: 98.4 ?F (36.9 ?C)   98.4 ?F (36.9 ?C)  ?TempSrc: Oral   Oral  ?SpO2: 96%  97% 97%  ?Weight:    60.4 kg  ?Height:      ? ? ?Intake/Output Summary (Last 24 hours) at 03/02/2022 0934 ?Last data filed at 03/02/2022 0500 ?Gross per 24 hour  ?Intake 112.72 ml  ?Output 2850 ml  ?Net -2737.28 ml  ? ? ?  03/02/2022  ?  6:18 AM 03/01/2022  ?  5:14 AM 02/28/2022  ?  5:03 AM  ?Last 3 Weights  ?Weight (lbs) 133 lb 1.6 oz 134 lb 14.7 oz 135 lb 12.9 oz  ?Weight (kg) 60.374 kg 61.2 kg 61.6 kg  ?   ? ?Telemetry  ?  ?Atrial fibrillation, HR 80s-90s - Personally Reviewed ? ?ECG  ?  ?No new tracings - Personally Reviewed ? ?Physical Exam  ? ?GEN: No acute distress. Laying in bed eating breakfast  ?Neck: No JVD ?Cardiac: Irregular rate and rhythm, no murmurs, rubs, or gallops.  ?Respiratory: Fine crackles in bilateral lung bases ?GI: Soft, nontender, non-distended  ?MS: No edema; No deformity. ?Neuro:  Nonfocal  ?Psych: Normal affect  ? ?Labs  ?  ?High Sensitivity Troponin:   ?Recent Labs  ?Lab 02/16/22 ?1441 02/16/22 ?1659 02/21/22 ?1338  02/21/22 ?1657  ?TROPONINIHS 49* 47* 38* 46*  ?   ?Chemistry ?Recent Labs  ?Lab 02/24/22 ?7824 02/25/22 ?0257 02/27/22 ?2353 02/27/22 ?1830 02/28/22 ?6144 03/01/22 ?0731  ?NA 131*   < > 132* 128* 128* 130*  ?K 3.1*   < > 2.7* 4.2 3.9 3.2*  ?CL 91*   < > 100 91* 91* 91*  ?CO2 29   < > '23 27 26 27  '$ ?GLUCOSE 124*   < > 135* 187* 141* 115*  ?BUN 21   < > 24* 28* 30* 31*  ?CREATININE 1.31*   < > 1.09* 1.34* 1.32* 1.39*  ?CALCIUM 8.1*   < > 6.6* 7.7* 8.2* 8.1*  ?MG  --    < > 1.7 2.5* 2.0 1.6*  ?PROT 5.3*  --  4.0*  --   --   --   ?ALBUMIN 3.0*  --  2.2* 2.7* 2.7*  --   ?AST 59*  --  58*  --   --   --   ?ALT 56*  --  52*  --   --   --   ?ALKPHOS 175*  --  117  --   --   --   ?  BILITOT 1.3*  --  0.5  --   --   --   ?GFRNONAA 41*   < > 52* 40* 41* 39*  ?ANIONGAP 11   < > '9 10 11 12  '$ ? < > = values in this interval not displayed.  ?  ?Lipids No results for input(s): CHOL, TRIG, HDL, LABVLDL, LDLCALC, CHOLHDL in the last 168 hours.  ?Hematology ?Recent Labs  ?Lab 02/25/22 ?0257 02/27/22 ?8182 02/28/22 ?0709  ?WBC 1.8* 1.8* 2.3*  ?RBC 3.15* 2.74* 3.40*  ?HGB 9.1* 7.8* 9.4*  ?HCT 27.9* 24.0* 30.3*  ?MCV 88.6 87.6 89.1  ?MCH 28.9 28.5 27.6  ?MCHC 32.6 32.5 31.0  ?RDW 14.7 15.1 15.2  ?PLT 278 240 278  ? ?Thyroid No results for input(s): TSH, FREET4 in the last 168 hours.  ?BNP ?Recent Labs  ?Lab 02/24/22 ?1045  ?BNP 3,448.1*  ?  ?DDimer No results for input(s): DDIMER in the last 168 hours.  ? ?Radiology  ?  ?DG Chest 2 View ? ?Result Date: 03/01/2022 ?CLINICAL DATA:  Acute respiratory failure. EXAM: CHEST - 2 VIEW COMPARISON:  Chest x-ray dated Feb 26, 2022. FINDINGS: Unchanged right chest wall port catheter. Stable cardiomegaly. Similar mild bibasilar interstitial thickening, right parahilar opacities, and trace bilateral pleural effusions with small amount of fluid in the minor fissure. No pneumothorax. No acute osseous abnormality. IMPRESSION: 1. Unchanged mild congestive heart failure. Electronically Signed   By: Titus Dubin M.D.   On: 03/01/2022 08:29   ? ?Cardiac Studies  ? ?R/L Heart Cath 01/15/22 ?  Prox RCA lesion is 15% stenosed. ?  LV end diastolic pressure is normal. ?  The left ventricular ejection fraction is 50-55% by visual estimate. ?  ?1.  Mild obstructive coronary artery disease. ?2.  Normal cardiac output and index with mean right atrial pressure of 4 mmHg and wedge pressure of 11 mmHg. ?3.  Rapid atrial fibrillation. ?  ?Recommendation: Medical therapy.  If the patient's rate over the next several hours is not improved we will admit the patient for inpatient management of rapid atrial fibrillation. ? ?Patient Profile  ?   ?81 y.o. female with a hx of hypertension, follicular lymphoma, paroxysmal atrial fibrillation on amiodarone, HFimpEF (heart failure with improved ejection fraction), minimal non-obstructive coronary artery disease on cath in March 2023, hypothyroidism, chronic kidney disease, aortic atherosclerosis.  ?  ?She had recurrent AFib in March 2023 and was admitted to the hospital.  She had some ?WMA on echocardiogram and a cardiac catheterization was done which demonstrated minimal non-obs CAD.  She was then admitted in April 2023 with AF with RVR.  She developed hypotension with Diltiazem and was placed on Amiodarone.  She was back in NSR when seen for f/u in the office on 02/02/22.   ?  ?Admitted 5/7 for recurrent afib RVR and cardiology consulted. Seen by Dr. Quentin Ore felt not a candidate for EP procedure and recommended rate control strategies and no repeat cardioversion.  ?  ?"Amiodarone '200mg'$  BID x1 week ?Lopressor '75mg'$  BID ?AFib fllow up is in place for next week" ?  ?Cardiology is asked for evaluation of CHF and orthostatic hypotension.  ? ?Assessment & Plan  ?  ?Persistent atrial fibrillation  ?- See EP recommendation as above ?- Currently rate controlled at 80s-90s on Amio '200mg'$  BID and lopressor 50 mg BID (reduced dose due to orthostatic hypotension)  ?- Continue Eliquis for anticoagulation   ?- Has afib clinic follow up on 5/22  ?  ?HFimpEF (  heart failure with improved ejection fraction) ?- Echo 12/2021 with LVEF of 50-55% ?- CXR yesterday showed mild unchanged CHF ?- BNP was 3448 on 5/10. Ordered follow up today  ?- Net I & O -6.5 L since admission. weight down 140>>133lb ?- She has been getting IV '40mg'$  BID. Has been on IV lasix since 5/9. Patient denies SOB, orthopnea, ankle edema. I suspect patient can transition to oral lasix tomorrow (already received IV lasix this AM)   ?- Added compression stockings yesterday  ?  ?Orthostatic hypotension  ?Supine 132/106, HR 89 ?Initial sitting 72/56, HR 85 ?2nd attempt sitting after supine 72/42 ?5 min in supine to achieve 105/64 (77) HR 86  ?- Reduced dose of metoprolol from 75 mg BID to 50 mg BID. Patient reports that her symptoms have improved ?- Added compression stockings  ? ? ?For questions or updates, please contact [place] ?Please consult [email or web address] for contact info under  ? ?  ?Signed, ?Margie Billet, PA-C  ?03/02/2022, 9:34 AM   ? ?History and all data above reviewed.  Patient examined.  I agree with the findings as above.   No pain.  Breathing is OK.  Weak.  The patient exam reveals ENI:[id number]   ,  Lungs: Decreased breath sounds with right greater than left crackles  ,  Abd: Positive bowel sounds, no rebound no guarding, Ext No edema  .  All available labs, radiology testing, previous records reviewed. Agree with documented assessment and plan.   Mildly better on decreased beta blocker.  I agree that we need to continue diuresis.  Compression stockings not in place yet.    Jeneen Rinks Dayyan Krist  11:52 AM  03/02/2022 ? ? ?

## 2022-03-02 NOTE — TOC Benefit Eligibility Note (Signed)
Patient Advocate Encounter ? ?Insurance verification completed.   ? ?The patient is currently admitted and upon discharge could be taking Farxiga 10 mg. ? ?The current 30 day co-pay is, $88.20.  ? ?The patient is currently admitted and upon discharge could be taking Jardiance 10 mg. ? ?The current 30 day co-pay is, $88.20.  ? ?The patient is insured through [employer] Part D  ? ? ? ?Lyndel Safe, CPhT ?Pharmacy Patient Advocate Specialist ?[facility] Patient Advocate Team ?Direct Number: [phone]  Fax: [phone] ? ? ? ? ? ?  ?

## 2022-03-02 NOTE — TOC Progression Note (Signed)
Transition of Care (TOC) - Progression Note  ? ? ?Patient Details  ?Name: Carrie [name] ?MRN: [id number] ?Date of Birth: 02-28-1941 ? ?Transition of Care (TOC) CM/SW Contact  ?[name], LCSWA ?Phone Number: ?03/02/2022, 10:21 AM ? ?Clinical Narrative:    ? ?CSW spoke with patients daughter Urban Gibson who accepted SNF bed offer with [facility]. CSW called Claiborne Billings with [employer] who confirmed SNF bed offer for patient. Claiborne Billings with [employer] will start insurance authorization for patient. CSW will continue to follow and assist with patients dc planning needs. ? ?Expected Discharge Plan: [place] ?Barriers to Discharge: Continued Medical Work up ? ?Expected Discharge Plan and Services ?Expected Discharge Plan: [place] ?In-house Referral: Clinical Social Work ?Discharge Planning Services: CM Consult ?Post Acute Care Choice: Home Health ?Living arrangements for the past 2 months: [place] ?Expected Discharge Date: 02/28/22               ?DME Arranged:  (following for oxygen) ?DME Agency: NA ?  ?  ?  ?HH Arranged: Disease Management, RN, PT, Nurse's Aide, OT, Social Work ?[place] Agency: [facility] [place]) ?Date HH Agency Contacted: 02/26/22 ?[place]: 1030 ?Representative spoke with at [place]: [name] ? ? ?Social Determinants of Health (SDOH) Interventions ?  ? ?Readmission Risk Interventions ? ?  02/26/2022  ? 10:30 AM 02/22/2022  ?  1:48 PM  ?Readmission Risk Prevention Plan  ?Transportation Screening Complete Complete  ?Medication Review Press photographer) Complete Complete  ?PCP or Specialist appointment within 3-5 days of discharge Complete Complete  ?[place] or Home Care Consult Complete Complete  ?SW Recovery Care/Counseling Consult Complete Complete  ?Palliative Care Screening Not Applicable Not Applicable  ?[place] Not Applicable Not Applicable  ? ? ?

## 2022-03-02 NOTE — Plan of Care (Signed)

## 2022-03-03 DIAGNOSIS — I5033 Acute on chronic diastolic (congestive) heart failure: Secondary | ICD-10-CM | POA: Diagnosis not present

## 2022-03-03 DIAGNOSIS — I4891 Unspecified atrial fibrillation: Secondary | ICD-10-CM | POA: Diagnosis not present

## 2022-03-03 DIAGNOSIS — C8238 Follicular lymphoma grade IIIa, lymph nodes of multiple sites: Secondary | ICD-10-CM | POA: Diagnosis not present

## 2022-03-03 DIAGNOSIS — I482 Chronic atrial fibrillation, unspecified: Secondary | ICD-10-CM | POA: Diagnosis not present

## 2022-03-03 LAB — CBC
HCT: 30.6 % — ABNORMAL LOW (ref 36.0–46.0)
Hemoglobin: 10 g/dL — ABNORMAL LOW (ref 12.0–15.0)
MCH: 28.5 pg (ref 26.0–34.0)
MCHC: 32.7 g/dL (ref 30.0–36.0)
MCV: 87.2 fL (ref 80.0–100.0)
Platelets: 296 10*3/uL (ref 150–400)
RBC: 3.51 MIL/uL — ABNORMAL LOW (ref 3.87–5.11)
RDW: 15.4 % (ref 11.5–15.5)
WBC: 2.8 10*3/uL — ABNORMAL LOW (ref 4.0–10.5)
nRBC: 0 % (ref 0.0–0.2)

## 2022-03-03 LAB — BASIC METABOLIC PANEL
Anion gap: 9 (ref 5–15)
BUN: 31 mg/dL — ABNORMAL HIGH (ref 8–23)
CO2: 30 mmol/L (ref 22–32)
Calcium: 8.4 mg/dL — ABNORMAL LOW (ref 8.9–10.3)
Chloride: 91 mmol/L — ABNORMAL LOW (ref 98–111)
Creatinine, Ser: 1.29 mg/dL — ABNORMAL HIGH (ref 0.44–1.00)
GFR, Estimated: 42 mL/min — ABNORMAL LOW (ref >60)
Glucose, Bld: 132 mg/dL — ABNORMAL HIGH (ref 70–99)
Potassium: 3.9 mmol/L (ref 3.5–5.1)
Sodium: 130 mmol/L — ABNORMAL LOW (ref 135–145)

## 2022-03-03 LAB — MAGNESIUM: Magnesium: 2.1 mg/dL (ref 1.7–2.4)

## 2022-03-03 MED ORDER — TRAZODONE HCL 50 MG PO TABS: 50.0000 mg | ORAL_TABLET | Freq: Every day | ORAL | 0 refills | Status: AC

## 2022-03-03 MED ORDER — FUROSEMIDE 40 MG PO TABS: 40.0000 mg | ORAL_TABLET | Freq: Every day | ORAL | 1 refills | Status: AC

## 2022-03-03 MED ORDER — HEPARIN SOD (PORK) LOCK FLUSH 100 UNIT/ML IV SOLN
500.0000 [IU] | INTRAVENOUS | Status: AC | PRN
  Administered 2022-03-03: 500 [IU]

## 2022-03-03 MED ORDER — METOPROLOL TARTRATE 50 MG PO TABS: 50.0000 mg | ORAL_TABLET | Freq: Two times a day (BID) | ORAL | 0 refills | Status: AC

## 2022-03-03 NOTE — Progress Notes (Signed)
Pt transports arrived for [employer] pt via [facility].  ?

## 2022-03-03 NOTE — Discharge Summary (Signed)
Physician Discharge Summary  ?Carrie [name] JFH:[id number] DOB: 1941/05/24 DOA: 02/21/2022 ? ?PCP: [name] ? ?Admit date: 02/21/2022 ?Discharge date: 03/03/2022 ? ?Admitted From: Home ?Disposition:  SNF ? ?Recommendations for Outpatient Follow-up:  ?Follow up with PCP in 1-2 weeks ?Please obtain BMP/CBC in one week ? ?Discharge Condition:Stable   ?CODE STATUS:DNR  ?Diet recommendation:  As tolerated ? ?Brief/Interim Summary: ?Carrie [name] is a 81 y.o. female with a history of PAF s/p DCCV 5/3 with ERAF, chronic HFpEF, Follicular lymphoma stage IIIa s/p chemotherapy with pancytopenia, hypothyroidism, HTN, HLD, stage IIIb CKD, and chronic cough since recovering from covid-19 infection early 2023 who presented to the ED 5/7 with shortness of breath, palpitations, lightheadedness found to be afebrile with AFib with RVR. This improved with amiodarone infusion and upward titration of beta blocker overseen by EP consultant. Later hospitalization complicated by pulmonary edema.  ?  ?Assessment and Plan: ?  ?Acute hypoxic respiratory failure: Note PASP 38 without morphologic features of pulmonary HTN, improving ?- Remains hypoxic with ambulation.  ?- Continue diuresis for CHF which is likely primary contributor.  Continue to diurese as below.  ?- Completed Tx for PNA on 5/14. Sputum culture normal flora.  ?-Continue to wean oxygen as tolerated.  ?  ?Deconditioning, frailty ?Acute on chronic ambulatory dysfunction:  ?Progressive since covid-19 infection in Oct 2022. Suspect she is experiencing continued decline and will best be served by rehabilitation at SNF level of care to get stronger. This is also a safer alternative at discharge than home where she has only intermittent assistance. ?- Discharge to [facility] SNF once insurance authorization is obtained (currently medically stable for discharge). ?  ?Hypomagnesemia:  ?Hypokalemia:  ?- Continue to monitor. ?  ?Paroxysmal A-fib, rate controlled: s/p DCCV  02/18/2022 with ERAF. Asymptomatic. Dx 2016. Note hx of hypotension with diltiazem and discontinuation of flecainide in the past, presumably due to CM.  ?- Followed by EP here: Continue metoprolol, with orthostasis, will return down to '50mg'$  po BID. ?- Continue amiodarone '200mg'$  BID until AFib clinic follow up (rescheduled) ?- Keep K and Mg replete, doing so as above. BMP is pending today. ?- EP confirmed no plans for additional interventions/ablation or repeat DCCV due to frailty and since she's failed DCCV recurrently and is rate controlled and asymptomatic from that perspective.   ?- On eliquis. ?  ?Orthostatic hypotension:  ?-Continue TED hose ?-Currently stable, continue diuresis and blood pressure/heart rate medications as above ?  ?Stage IIIa follicular lymphoma: Currently being followed by the [facility]  ?- Continue oncology follow up. Discussed with Dr. Alvy Bimler who will reschedule her follow up appointment. Has reported last scan showed NED, advises against GCSF at this time. ?  ?Leukopenia and anemia: Chronic.  ?Stable, at baseline ?  ?Stage IIIb CKD:  ?-Downtrending appropriately back to baseline ?  ?LFT elevation: Overall improving-to-improving. No cirrhosis by U/S. ?- Recheck LFTs at outpatient follow up ?  ?Hypervolemic hyponatremia, chronic slightly improved with diuresis. ?  ?Insomnia:  ?- Continue new start trazodone qHS, will minimize nocturnal interruptions. ? ?Discharge Diagnoses:  ?Principal Problem: ?  A-fib ([place]) ?Active Problems: ?  Atrial fibrillation with RVR ([place]) ?  (HFimpEF) heart failure with improved ejection fraction ([place]) ?  Grade 3a follicular lymphoma of lymph nodes of multiple regions [facility] ?  HFimpEF (Heart Failure with improved EF) ?  Orthostatic hypotension ? ? ? ?Discharge Instructions ? ?Discharge Instructions   ? ? Discharge patient   Complete by: As directed ?  ?  Discharge disposition: 03-Skilled Nursing Facility  ? Discharge patient date: 03/03/2022  ? ?  ? ?Allergies as of 03/03/2022    ? ?   Reactions  ? Amiodarone Other (See Comments)  ? "Hyperthyroidism"....BUT, the patient is taking this (2023)  ? ?  ? ?  ?Medication List  ?  ? ?STOP taking these medications   ? ?amoxicillin 500 MG capsule ?Commonly known as: AMOXIL ?  ? ?  ? ?TAKE these medications   ? ?acetaminophen 500 MG tablet ?Commonly known as: TYLENOL ?Take 500 mg by mouth every 6 (six) hours as needed for mild pain or headache. ?  ?amiodarone 200 MG tablet ?Commonly known as: PACERONE ?Take 1 tab (200 mg total) twice daily for 1 week and then reduce to 1 tablet (200 mg total) once daily. ?What changed:  ?how much to take ?how to take this ?when to take this ?additional instructions ?  ?Eliquis 5 MG Tabs tablet ?Generic drug: apixaban ?Take 5 mg by mouth 2 (two) times daily. ?  ?furosemide 40 MG tablet ?Commonly known as: LASIX ?Take 1 tablet (40 mg total) by mouth daily. ?Start taking on: Mar 04, 2022 ?What changed:  ?medication strength ?how much to take ?  ?levothyroxine 88 MCG tablet ?Commonly known as: SYNTHROID ?Take 1 tablet (88 mcg total) by mouth daily before breakfast. ?  ?lidocaine-prilocaine cream ?Commonly known as: EMLA ?Apply 1 application topically daily as needed. ?What changed: reasons to take this ?  ?metoprolol tartrate 50 MG tablet ?Commonly known as: LOPRESSOR ?Take 1 tablet (50 mg total) by mouth 2 (two) times daily. ?  ?polyvinyl alcohol 1.4 % ophthalmic solution ?Commonly known as: LIQUIFILM TEARS ?Place 1 drop into both eyes 3 (three) times daily as needed for dry eyes. ?  ?traZODone 50 MG tablet ?Commonly known as: DESYREL ?Take 1 tablet (50 mg total) by mouth at bedtime. ?  ? ?  ? ?  ?  ? ? ?  ?Durable Medical Equipment  ?(From admission, onward)  ?  ? ? ?  ? ?  Start     Ordered  ? 02/26/22 1437  For home use only DME oxygen  Once       ?Question Answer Comment  ?Length of Need 6 Months   ?Mode or (Route) Nasal cannula   ?Liters per Minute 2   ?Frequency Continuous (stationary and portable oxygen unit  needed)   ?Oxygen delivery system Gas   ?  ? 02/26/22 1436  ? ?  ?  ? ?  ? ? Follow-up Information   ? ? [place] Follow up.   ?Specialty: Cardiology ?Why: 03/02/22 @ 10:00AM with C. Fenton, PA-C ?Contact information: ?[address] ?[id number] mc ?[place] ?[phone] ? ?  ?  ? ? [place], [place] Follow up.   ?Specialty: Home Health Services ?Why: Andrew- Registered Nurse, Physical Therapy, Occupational Therapy, Aide, Clinical Social Worker office to call with visit times. ?Contact information: ?[address] ?[address] ?[place] [address] ?[phone] ? ? ?  ?  ? ?  ?  ? ?  ? ?Allergies  ?Allergen Reactions  ? Amiodarone Other (See Comments)  ?  "Hyperthyroidism"....BUT, the patient is taking this (2023)  ? ?Procedures/Studies: ?DG Chest 1 View ? ?Result Date: 02/23/2022 ?CLINICAL DATA:  Shortness of breath and chest pain. EXAM: CHEST  1 VIEW COMPARISON:  Chest radiograph dated 02/21/2022. FINDINGS: Right-sided Port-A-Cath in similar position. There is cardiomegaly with vascular  congestion and mild edema. Pneumonia is not excluded. No focal consolidation, pleural effusion or pneumothorax. No acute osseous pathology. Left shoulder arthroplasty. IMPRESSION: Cardiomegaly with vascular congestion and mild edema. Pneumonia is not excluded. Electronically Signed   By: Anner Crete M.D.   On: 02/23/2022 02:20  ? ?DG Chest 1 View ? ?Result Date: 02/16/2022 ?CLINICAL DATA:  Shortness of breath. EXAM: CHEST  1 VIEW COMPARISON:  01/21/2022 FINDINGS: The right IJ power port is stable. Stable borderline cardiac enlargement. Low lung volumes with right greater than left vascular crowding and streaky atelectasis. No definite infiltrates or effusions. No pneumothorax. IMPRESSION: Low lung volumes with vascular crowding and atelectasis, more pronounced on right. Electronically Signed   By: Marijo Sanes M.D.   On: 02/16/2022 15:21   ? ?DG Chest 2 View ? ?Result Date: 03/01/2022 ?CLINICAL DATA:  Acute respiratory failure. EXAM: CHEST - 2 VIEW COMPARISON:  Chest x-ray dated Feb 26, 2022. FINDINGS: Unchanged right chest wall port catheter. Stabl

## 2022-03-03 NOTE — Progress Notes (Signed)
?  Mobility Specialist Criteria Algorithm Info. ? ? 03/03/22 1647  ?Mobility  ?Activity Refused mobility ?(Patient declined; to be DC today)  ? ? ?03/03/2022 ?4:48 PM ? ?[place] Naves, CMS, BS EXP ?Acute Rehabilitation Services  ?HNGIT:[phone] ?Office: [phone] ? ?

## 2022-03-03 NOTE — Progress Notes (Deleted)
?Progress Note ? ?Patient: Carrie [name] IDP:[id number] DOB: Mar 08, 1941  ?DOA: 02/21/2022  DOS: 03/03/2022  ?  ?Brief hospital course: ?Dafina TABIA LANDOWSKI is a 81 y.o. female with a history of PAF s/p DCCV 5/3 with ERAF, chronic HFpEF, Follicular lymphoma stage IIIa s/p chemotherapy with pancytopenia, hypothyroidism, HTN, HLD, stage IIIb CKD, and chronic cough since recovering from covid-19 infection early 2023 who presented to the ED 5/7 with shortness of breath, palpitations, lightheadedness found to be afebrile with AFib with RVR. This improved with amiodarone infusion and upward titration of beta blocker overseen by EP consultant. Later hospitalization complicated by pulmonary edema.  ? ?Assessment and Plan: ? ?Acute hypoxic respiratory failure: Note PASP 38 without morphologic features of pulmonary HTN, improving ?- Remains hypoxic with ambulation.  ?- Continue diuresis for CHF which is likely primary contributor.  Continue to diurese as below.  ?- Completed Tx for PNA on 5/14. Sputum culture normal flora.  ?-Continue to wean oxygen as tolerated.  ? ?Deconditioning, frailty ?Acute on chronic ambulatory dysfunction:  ?Progressive since covid-19 infection in Oct 2022. Suspect she is experiencing continued decline and will best be served by rehabilitation at SNF level of care to get stronger. This is also a safer alternative at discharge than home where she has only intermittent assistance. ?- Discharge to [facility] SNF once insurance authorization is obtained (currently medically stable for discharge). ? ?Hypomagnesemia:  ?Hypokalemia:  ?- Continue to monitor. ? ?Paroxysmal A-fib, rate controlled: s/p DCCV 02/18/2022 with ERAF. Asymptomatic. Dx 2016. Note hx of hypotension with diltiazem and discontinuation of flecainide in the past, presumably due to CM.  ?- Followed by EP here: Continue metoprolol, with orthostasis, will return down to '50mg'$  po BID. ?- Continue amiodarone '200mg'$  BID until AFib clinic follow up  (rescheduled) ?- Keep K and Mg replete, doing so as above. BMP is pending today. ?- EP confirmed no plans for additional interventions/ablation or repeat DCCV due to frailty and since she's failed DCCV recurrently and is rate controlled and asymptomatic from that perspective.   ?- On eliquis. ? ?Orthostatic hypotension:  ?-Continue TED hose ?-Currently stable, continue diuresis and blood pressure/heart rate medications as above ? ?Stage IIIa follicular lymphoma: Currently being followed by the [facility]  ?- Continue oncology follow up. Discussed with Dr. Alvy Bimler who will reschedule her follow up appointment. Has reported last scan showed NED, advises against GCSF at this time. ? ?Leukopenia and anemia: Chronic.  ?Stable, at baseline ? ?Stage IIIb CKD:  ?-Downtrending appropriately back to baseline ? ?LFT elevation: Overall improving-to-improving. No cirrhosis by U/S. ?- Recheck LFTs at outpatient follow up ? ?Hypervolemic hyponatremia, chronic slightly improved with diuresis. ? ?Insomnia:  ?- Continue new start trazodone qHS, will minimize nocturnal interruptions. ? ?Subjective: No acute issues or events overnight denies nausea vomiting diarrhea constipation headache fevers chills or chest pain ? ?Objective: ?Vitals:  ? 03/02/22 0618 03/02/22 1046 03/02/22 1955 03/03/22 0432  ?BP: (!) 121/57 108/63 108/78 107/78  ?Pulse: 95 78  91  ?Resp: '15 17 19 16  '$ ?Temp: 98.4 ?F (36.9 ?C) 98.4 ?F (36.9 ?C) 97.6 ?F (36.4 ?C) 98.6 ?F (37 ?C)  ?TempSrc: Oral Oral Oral Oral  ?SpO2: 97% 94% 94% 94%  ?Weight: 60.4 kg   59 kg  ?Height:      ?Gen: 81 y.o. female in no distress ?Pulm: Nonlabored breathing room air. ?CV: Irreg irreg, rate in 90's without murmur, rub, or gallop. No JVD, no dependent edema. ?GI: Abdomen soft, non-tender, non-distended, with normoactive bowel sounds.  ?  Ext: Warm, no deformities ?Skin: No rashes, lesions or ulcers on visualized skin. ?Neuro: Alert and oriented. No focal neurological deficits. ? ?Data Personally  reviewed: ? ?CBC: ?Recent Labs  ?Lab 02/25/22 ?0257 02/27/22 ?4128 02/28/22 ?0709 03/03/22 ?0500  ?WBC 1.8* 1.8* 2.3* 2.8*  ?NEUTROABS 1.5* 1.6*  --   --   ?HGB 9.1* 7.8* 9.4* 10.0*  ?HCT 27.9* 24.0* 30.3* 30.6*  ?MCV 88.6 87.6 89.1 87.2  ?PLT 278 240 278 296  ? ? ?Basic Metabolic Panel: ?Recent Labs  ?Lab 02/27/22 ?1830 02/28/22 ?7867 03/01/22 ?0731 03/02/22 ?1123 03/03/22 ?0500  ?NA 128* 128* 130* 131* 130*  ?K 4.2 3.9 3.2* 3.6 3.9  ?CL 91* 91* 91* 91* 91*  ?CO2 '27 26 27 29 30  '$ ?GLUCOSE 187* 141* 115* 245* 132*  ?BUN 28* 30* 31* 34* 31*  ?CREATININE 1.34* 1.32* 1.39* 1.42* 1.29*  ?CALCIUM 7.7* 8.2* 8.1* 8.1* 8.4*  ?MG 2.5* 2.0 1.6* 2.1 2.1  ?PHOS 3.3 3.3  --   --   --   ? ? ?GFR: ?Estimated Creatinine Clearance: 29.2 mL/min (A) (by C-G formula based on SCr of 1.29 mg/dL (H)). ?Liver Function Tests: ?Recent Labs  ?Lab 02/27/22 ?6720 02/27/22 ?1830 02/28/22 ?9470  ?AST 58*  --   --   ?ALT 52*  --   --   ?ALKPHOS 117  --   --   ?BILITOT 0.5  --   --   ?PROT 4.0*  --   --   ?ALBUMIN 2.2* 2.7* 2.7*  ? ? ?Urine analysis: ?   ?Component Value Date/Time  ? COLORURINE YELLOW 02/21/2022 1340  ? APPEARANCEUR CLEAR 02/21/2022 1340  ? LABSPEC 1.010 02/21/2022 1340  ? LABSPEC 1.005 03/18/2017 1315  ? PHURINE 5.0 02/21/2022 1340  ? GLUCOSEU NEGATIVE 02/21/2022 1340  ? GLUCOSEU Negative 03/18/2017 1315  ? HGBUR SMALL (A) 02/21/2022 1340  ? [place] NEGATIVE 02/21/2022 1340  ? BILIRUBINUR Negative 03/18/2017 1315  ? [place] NEGATIVE 02/21/2022 1340  ? PROTEINUR NEGATIVE 02/21/2022 1340  ? UROBILINOGEN 0.2 03/18/2017 1315  ? NITRITE NEGATIVE 02/21/2022 1340  ? LEUKOCYTESUR NEGATIVE 02/21/2022 1340  ? LEUKOCYTESUR Negative 03/18/2017 1315  ? ?Recent Results (from the past 240 hour(s))  ?Expectorated Sputum Assessment w Gram Stain, Rflx to Resp Cult     Status: None  ? Collection Time: 02/24/22 10:51 PM  ? Specimen: Expectorated Sputum  ?Result Value Ref Range Status  ? Specimen Description EXPECTORATED SPUTUM  Final  ? Special  Requests NONE  Final  ? Sputum evaluation   Final  ?  THIS SPECIMEN IS ACCEPTABLE FOR SPUTUM CULTURE ?Performed at [facility], [place] [address], [place], [place] [address] ?  ? Report Status 02/27/2022 FINAL  Final  ?Culture, Respiratory w Gram Stain     Status: None  ? Collection Time: 02/24/22 10:51 PM  ?Result Value Ref Range Status  ? Specimen Description EXPECTORATED SPUTUM  Final  ? Special Requests NONE Reflexed from [id number]  Final  ? Gram Stain   Final  ?  FEW SQUAMOUS EPITHELIAL CELLS PRESENT ?FEW WBC PRESENT, PREDOMINANTLY MONONUCLEAR ?FEW GRAM POSITIVE COCCI IN PAIRS ?FEW GRAM VARIABLE ROD ?RARE YEAST ?  ? Culture   Final  ?  MODERATE Normal respiratory flora-no Staph aureus or Pseudomonas seen ?Performed at [facility], [place] [address], [place], [place] [address] ?  ? Report Status 02/27/2022 FINAL  Final  ?   ? ? ?Family Communication: None at bedside. ? ?Disposition: ?Status is: Inpatient ?Remains inpatient  appropriate because: Symptomatic orthostatic hypotension, hypoxic respiratory failure, unsafe DC ?Planned Discharge Destination: SNF once insurance authorization is obtained. ? ?Holli Humbles DO ? ?03/03/2022 7:05 AM ?Page by [email or web address]  ?

## 2022-03-03 NOTE — TOC Transition Note (Signed)
Transition of Care (TOC) - CM/SW Discharge Note ? ? ?Patient Details  ?Name: Carrie [name] ?MRN: [id number] ?Date of Birth: 03-Mar-1941 ? ?Transition of Care (TOC) CM/SW Contact:  ?[name], LCSWA ?Phone Number: ?03/03/2022, 3:42 PM ? ? ?Clinical Narrative:    ? ?Patient will DC to: [facility] ? ?Anticipated DC date: 03/03/2022 ? ?Family notified: [name] ? ?Transport by: [name] ? ?? ? ?Per MD patient ready for DC to [facility] . RN, patient, patient's family, and facility notified of DC. Discharge Summary sent to facility. RN given number for report [phone] RM# [phone]. DNR signed by MD attached to patients dc packet.DC packet on chart. Ambulance transport requested for patient. ? ?CSW signing off.  ? ?Final next level of care: [place] ?Barriers to Discharge: No Barriers Identified ? ? ?Patient Goals and CMS Choice ?Patient states their goals for this hospitalization and ongoing recovery are:: SNF ?CMS Medicare.gov Compare Post Acute Care list provided to:: Patient ?Choice offered to / list presented to : Patient ? ?Discharge Placement ?  ?           ?Patient chooses bed at: [facility] ?Patient to be transferred to facility by: [facility] ?Name of family member notified: [name] ?Patient and family notified of of transfer: 03/03/22 ? ?Discharge Plan and Services ?In-house Referral: Clinical Social Work ?Discharge Planning Services: CM Consult ?Post Acute Care Choice: Home Health          ?DME Arranged:  (following for oxygen) ?DME Agency: NA ?  ?  ?  ?HH Arranged: Disease Management, RN, PT, Nurse's Aide, OT, Social Work ?[place] Agency: [facility]) ?Date HH Agency Contacted: 02/26/22 ?Time [place]: 1030 ?Representative spoke with at [place]: [name] ? ?Social Determinants of Health (SDOH) Interventions ?  ? ? ?Readmission Risk Interventions ? ?  02/26/2022  ? 10:30 AM 02/22/2022  ?  1:48 PM  ?Readmission Risk Prevention Plan  ?Transportation Screening Complete Complete   ?Medication Review Press photographer) Complete Complete  ?PCP or Specialist appointment within 3-5 days of discharge Complete Complete  ?[place] or Home Care Consult Complete Complete  ?SW Recovery Care/Counseling Consult Complete Complete  ?Palliative Care Screening Not Applicable Not Applicable  ?[place] Not Applicable Not Applicable  ? ? ? ? ? ?

## 2022-03-03 NOTE — Progress Notes (Signed)
Physical Therapy Treatment ?Patient Details ?Name: Carrie [name] ?MRN: [id number] ?DOB: 1941/01/31 ?Today's Date: 03/03/2022 ? ? ?History of Present Illness 81 y/o female admitted 5/7 with SOB and lightheadedness due to Afib. Admit 5/2-5/4 for same. PMHx:non-hodgkins lymphoma, Afib s/p cardioversion, CHF, HTN, CKD, chronic cough post Covid. ? ?  ?PT Comments  ? ? The pt was able to complete multiple sit-stand transfers with minA-minG and completed static standing for pericare for 1-2 min at a time. The pt reports progressive fatigue with each standing trial, and demos worsening standing tolerance with each rep, eventually declining ambulation or transfer to recliner due to fatigue. Given continued deficits in endurance, strength, and stability as well as poor awareness of assist needed (incontinent of BM and unaware), continue to recommend SNF rehab at d/c.  ? ?VITALS:  ?- supine in bed- BP: 98/75 (83); ?- sitting EOB - BP: 100/75 (84); ?- standing - BP: 112/96 (102) (pt sat at end of recording due to fatigue) ?- sitting HOB at 50 deg- 98/60 (73) ? ? ?   ?Recommendations for follow up therapy are one component of a multi-disciplinary discharge planning process, led by the attending physician.  Recommendations may be updated based on patient status, additional functional criteria and insurance authorization. ? ?Follow Up Recommendations ? Skilled nursing-short term rehab (<3 hours/day) ?  ?  ?Assistance Recommended at Discharge Frequent or constant Supervision/Assistance  ?Patient can return home with the following A little help with walking and/or transfers;A little help with bathing/dressing/bathroom;Help with stairs or ramp for entrance;Assistance with cooking/housework ?  ?Equipment Recommendations ? None recommended by PT  ?  ?Recommendations for Other Services   ? ? ?  ?Precautions / Restrictions Precautions ?Precautions: Fall ?Precaution Comments: watch HR, BP and SPO2 ?Restrictions ?Weight Bearing  Restrictions: No  ?  ? ?Mobility ? Bed Mobility ?Overal bed mobility: Needs Assistance ?Bed Mobility: Supine to Sit, Sit to Supine ?  ?  ?Supine to sit: Modified independent (Device/Increase time), HOB elevated ?Sit to supine: Supervision ?  ?General bed mobility comments: no assist, pt with heavy use of bed rails and HOB elevated. returned to flat bed without assist ?  ? ?Transfers ?Overall transfer level: Needs assistance ?Equipment used: Rolling walker (2 wheels) ?Transfers: Sit to/from Stand ?Sit to Stand: Min assist, Min guard ?  ?  ?  ?  ?  ?General transfer comment: minA initially to rise and steady, pt then refusing assist and able to rise with minG, completed x5 through session for pericare ?  ? ?Ambulation/Gait ?  ?  ?  ?  ?  ?  ?  ?General Gait Details: pt declined due to fatigue ? ? ? ?  ?Balance Overall balance assessment: Needs assistance ?Sitting-balance support: No upper extremity supported ?Sitting balance-Leahy Scale: Good ?Sitting balance - Comments: static sitting and donning shoes without assist ?  ?Standing balance support: Single extremity supported, Bilateral upper extremity supported ?Standing balance-Leahy Scale: Poor ?Standing balance comment: poor tolerance, able to static stand with single UE support, declined steps at this time ?  ?  ?  ?  ?  ?  ?  ?  ?  ?  ?  ?  ? ?  ?Cognition Arousal/Alertness: Awake/alert ?Behavior During Therapy: Flat affect ?Overall Cognitive Status: Impaired/Different from baseline ?Area of Impairment: Safety/judgement, Problem solving, Awareness ?  ?  ?  ?  ?  ?  ?  ?  ?  ?  ?  ?  ?Safety/Judgement: Decreased awareness of safety ?Awareness:  Intellectual ?Problem Solving: Decreased initiation, Requires verbal cues ?General Comments: pt unaware that she was soiled of BM upon my arrival, poor insight to endurance and activity tolerance ?  ?  ? ?  ?Exercises   ? ?  ?General Comments General comments (skin integrity, edema, etc.): BP remained stable with mobility, pt  declined mobility progression due to fatigue ?  ?  ? ?Pertinent Vitals/Pain Pain Assessment ?Pain Assessment: No/denies pain  ? ? ? ?PT Goals (current goals can now be found in the care plan section) Acute Rehab PT Goals ?Patient Stated Goal: to go home ?PT Goal Formulation: With patient ?Time For Goal Achievement: 03/09/22 ?Potential to Achieve Goals: Good ?Progress towards PT goals: Progressing toward goals ? ?  ?Frequency ? ? ? Min 3X/week ? ? ? ?  ?PT Plan Current plan remains appropriate  ? ? ?   ?AM-PAC PT "6 Clicks" Mobility   ?Outcome Measure ? Help needed turning from your back to your side while in a flat bed without using bedrails?: None ?Help needed moving from lying on your back to sitting on the side of a flat bed without using bedrails?: None ?Help needed moving to and from a bed to a chair (including a wheelchair)?: A Little ?Help needed standing up from a chair using your arms (e.g., wheelchair or bedside chair)?: A Lot ?Help needed to walk in hospital room?: Total ?Help needed climbing 3-5 steps with a railing? : Total ?6 Click Score: 15 ? ?  ?End of Session Equipment Utilized During Treatment: Gait belt ?Activity Tolerance: Patient limited by fatigue ?Patient left: in bed;with call bell/phone within reach;with bed alarm set (chair position with HOB at 55 deg) ?Nurse Communication: Mobility status ?PT Visit Diagnosis: Other abnormalities of gait and mobility (R26.89);Difficulty in walking, not elsewhere classified (R26.2) ?  ? ? ?Time: 1010-1041 ?PT Time Calculation (min) (ACUTE ONLY): 31 min ? ?Charges:  $Therapeutic Exercise: 8-22 mins ?$Therapeutic Activity: 8-22 mins          ?          ? ?West Carbo, PT, DPT  ? ?Acute Rehabilitation Department ?Pager #: [phone] ? ? ?Sandra Cockayne ?03/03/2022, 11:13 AM ? ?

## 2022-03-03 NOTE — Progress Notes (Addendum)
? ?Progress Note ? ?Patient Name: Carrie [name] ?Date of Encounter: 03/03/2022 ? ?[facility] Cardiologist: [name], MD  ? ?Subjective  ? ?Patient is doing well today, denies any chest pain, palpitations, dizziness, sob, orthopnea.  ? ?Inpatient Medications  ?  ?Scheduled Meds: ? amiodarone  200 mg Oral BID  ? apixaban  5 mg Oral BID  ? Chlorhexidine Gluconate Cloth  6 each Topical Daily  ? furosemide  40 mg Oral Daily  ? levothyroxine  88 mcg Oral QAC breakfast  ? metoprolol tartrate  50 mg Oral BID  ? sodium chloride flush  3 mL Intravenous Q12H  ? traZODone  50 mg Oral QHS  ? ?Continuous Infusions: ? sodium chloride    ? ?PRN Meds: ?sodium chloride, acetaminophen, ondansetron (ZOFRAN) IV, sodium chloride flush, sodium chloride flush  ? ?Vital Signs  ?  ?Vitals:  ? 03/02/22 0618 03/02/22 1046 03/02/22 1955 03/03/22 0432  ?BP: (!) 121/57 108/63 108/78 107/78  ?Pulse: 95 78  91  ?Resp: '15 17 19 16  '$ ?Temp: 98.4 ?F (36.9 ?C) 98.4 ?F (36.9 ?C) 97.6 ?F (36.4 ?C) 98.6 ?F (37 ?C)  ?TempSrc: Oral Oral Oral Oral  ?SpO2: 97% 94% 94% 94%  ?Weight: 60.4 kg   59 kg  ?Height:      ? ? ?Intake/Output Summary (Last 24 hours) at 03/03/2022 0955 ?Last data filed at 03/03/2022 0500 ?Gross per 24 hour  ?Intake 60 ml  ?Output 2610 ml  ?Net -2550 ml  ? ? ?  03/03/2022  ?  4:32 AM 03/02/2022  ?  6:18 AM 03/01/2022  ?  5:14 AM  ?Last 3 Weights  ?Weight (lbs) 130 lb 133 lb 1.6 oz 134 lb 14.7 oz  ?Weight (kg) 58.968 kg 60.374 kg 61.2 kg  ?   ? ?Telemetry  ?  ?Atrial fibrillation, HR in the 90s - Personally Reviewed ? ?ECG  ?  ?No new tracings - Personally Reviewed ? ?Physical Exam  ? ?GEN: No acute distress.  Laying in bed eating breakfast  ?Neck: No JVD ?Cardiac: Irregular rate and rhythm, no murmurs, rubs, or gallops.  ?Respiratory: Fine crackles in bilateral lung bases  ?GI: Soft, nontender, non-distended  ?MS: No edema; No deformity. No compression stockings  ?Neuro:  Nonfocal  ?Psych: Normal affect  ? ?Labs  ?  ?High  Sensitivity Troponin:   ?Recent Labs  ?Lab 02/16/22 ?1441 02/16/22 ?1659 02/21/22 ?1338 02/21/22 ?1657  ?TROPONINIHS 49* 47* 38* 46*  ?   ?Chemistry ?Recent Labs  ?Lab 02/27/22 ?1610 02/27/22 ?1830 02/28/22 ?9604 03/01/22 ?0731 03/02/22 ?1123 03/03/22 ?0500  ?NA 132* 128* 128* 130* 131* 130*  ?K 2.7* 4.2 3.9 3.2* 3.6 3.9  ?CL 100 91* 91* 91* 91* 91*  ?CO2 '23 27 26 27 29 30  '$ ?GLUCOSE 135* 187* 141* 115* 245* 132*  ?BUN 24* 28* 30* 31* 34* 31*  ?CREATININE 1.09* 1.34* 1.32* 1.39* 1.42* 1.29*  ?CALCIUM 6.6* 7.7* 8.2* 8.1* 8.1* 8.4*  ?MG 1.7 2.5* 2.0 1.6* 2.1 2.1  ?PROT 4.0*  --   --   --   --   --   ?ALBUMIN 2.2* 2.7* 2.7*  --   --   --   ?AST 58*  --   --   --   --   --   ?ALT 52*  --   --   --   --   --   ?ALKPHOS 117  --   --   --   --   --   ?  BILITOT 0.5  --   --   --   --   --   ?GFRNONAA 52* 40* 41* 39* 38* 42*  ?ANIONGAP '9 10 11 12 11 9  '$ ?  ?Lipids No results for input(s): CHOL, TRIG, HDL, LABVLDL, LDLCALC, CHOLHDL in the last 168 hours.  ?Hematology ?Recent Labs  ?Lab 02/27/22 ?3382 02/28/22 ?0709 03/03/22 ?0500  ?WBC 1.8* 2.3* 2.8*  ?RBC 2.74* 3.40* 3.51*  ?HGB 7.8* 9.4* 10.0*  ?HCT 24.0* 30.3* 30.6*  ?MCV 87.6 89.1 87.2  ?MCH 28.5 27.6 28.5  ?MCHC 32.5 31.0 32.7  ?RDW 15.1 15.2 15.4  ?PLT 240 278 296  ? ?Thyroid No results for input(s): TSH, FREET4 in the last 168 hours.  ?BNP ?Recent Labs  ?Lab 02/24/22 ?1045 03/02/22 ?1123  ?BNP 3,448.1* 854.9*  ?  ?DDimer No results for input(s): DDIMER in the last 168 hours.  ? ?Radiology  ?  ?No results found. ? ?Cardiac Studies  ? ?R/L Heart Cath 01/15/22 ?  Prox RCA lesion is 15% stenosed. ?  LV end diastolic pressure is normal. ?  The left ventricular ejection fraction is 50-55% by visual estimate. ?  ?1.  Mild obstructive coronary artery disease. ?2.  Normal cardiac output and index with mean right atrial pressure of 4 mmHg and wedge pressure of 11 mmHg. ?3.  Rapid atrial fibrillation. ?  ?Recommendation: Medical therapy.  If the patient's rate over the next several  hours is not improved we will admit the patient for inpatient management of rapid atrial fibrillation. ? ?Patient Profile  ?   ?81 y.o. female with a hx of hypertension, follicular lymphoma, paroxysmal atrial fibrillation on amiodarone, HFimpEF (heart failure with improved ejection fraction), minimal non-obstructive coronary artery disease on cath in March 2023, hypothyroidism, chronic kidney disease, aortic atherosclerosis.  ?  ?She had recurrent AFib in March 2023 and was admitted to the hospital.  She had some ?WMA on echocardiogram and a cardiac catheterization was done which demonstrated minimal non-obs CAD.  She was then admitted in April 2023 with AF with RVR.  She developed hypotension with Diltiazem and was placed on Amiodarone.  She was back in NSR when seen for f/u in the office on 02/02/22.   ?  ?Admitted 5/7 for recurrent afib RVR and cardiology consulted. Seen by Dr. Quentin Ore felt not a candidate for EP procedure and recommended rate control strategies and no repeat cardioversion.  ?  ?"Amiodarone '200mg'$  BID x1 week ?Lopressor '75mg'$  BID ?AFib fllow up is in place for next week" ?  ?Cardiology is asked for evaluation of CHF and orthostatic hypotension.  ? ?Assessment & Plan  ?  ?Persistent atrial fibrillation  ?- See EP recommendation as above ?- Currently rate controlled in the 90s on Amio '200mg'$  BID and lopressor 50 mg BID (reduced dose due to orthostatic hypotension)  ?- Continue Eliquis for anticoagulation  ?- Has afib clinic follow up on 5/22  ?  ?HFimpEF (heart failure with improved ejection fraction) ?- Echo 12/2021 with LVEF of 50-55% ?- CXR yesterday showed mild unchanged CHF ?- BNP was 3448 on 5/10. Decreased down to 854.9 on 5/16 ?- Net I & O -9.1 L since admission. weight down 140>>130lb  ?- She has been getting IV '40mg'$  BID. Has been on IV lasix since 5/9. Patient denies SOB, orthopnea, ankle edema. Transitioned to oral lasix 40 mg daily today, hopefully this will help with orthostatic  hypotension   ?- Added compression stockings-- patient not yet wearing. Ordered pair today and  discussed with RN   ?  ?Orthostatic hypotension ?- Reduced dose of metoprolol from 75 mg BID to 50 mg BID. Patient reports that her symptoms have improved, but continued to have positive orthostatic vital signs yesterday. May need further decrease in metoprolol, but hesitant as patient's HR did increase to 90s after dose reduction  ?- May improve as patient now off IV diuretics  ?- Added compression stockings-- patient not yet wearing, ordered today and discussed with RN ?- Encouraged PO intake   ?- Concern for deconditioning and patient is being discharged to SNF  ?   ? ?For questions or updates, please contact [place] ?Please consult [email or web address] for contact info under  ? ?  ?   ?Signed, ?Margie Billet, PA-C  ?03/03/2022, 9:55 AM   ? ? ?History and all data above reviewed.  Patient examined.  I agree with the findings as above.  Looks better today.  Weak but no acute SOB or pain. The patient exam reveals COR: RRR  ,  Lungs: Decreased breath sounds with basilar crackles  ,  Abd: Positive bowel sounds, no rebound no guarding, Ext No edema  .  All available labs, radiology testing, previous records reviewed. Agree with documented assessment and plan.     Orthostatic hypotension:  Improved.  Now on PO Lasix.  Atrial fib:  Flutter with controlled ventricular rate now.    Continue to current dose of beta blocker.   Jeneen Rinks [place]  11:52 AM  03/03/2022 ?

## 2022-03-03 NOTE — TOC Progression Note (Addendum)
Transition of Care (TOC) - Progression Note  ? ? ?Patient Details  ?Name: Carrie [name] ?MRN: [id number] ?Date of Birth: [birth year] ? ?Transition of Care (TOC) CM/SW Contact  ?[name], LCSWA ?Phone Number: ?03/03/2022, 11:42 AM ? ?Clinical Narrative:    ? ?Update- Kelly with [facility] confirmed they can accept patient today. CSW informed MD. ? ?Update- CSW received call from [place] with [facility] who confirmed that patients insurance authorization has been approved. Claiborne Billings confirmed she will call CSW back to see if they can accept patient today or tomorrow. CSW awaiting callback. ? ? ?CSW called Claiborne Billings with [facility] to check on status of insurance authorization for patient. CSW LVM, awaiting callback. CSW will continue to follow and assist with patients dc planning needs. ? ?Expected Discharge Plan: [place] ?Barriers to Discharge: Continued Medical Work up ? ?Expected Discharge Plan and Services ?Expected Discharge Plan: [place] ?In-house Referral: Clinical Social Work ?Discharge Planning Services: CM Consult ?Post Acute Care Choice: Home Health ?Living arrangements for the past 2 months: [place] ?Expected Discharge Date: 02/28/22               ?DME Arranged:  (following for oxygen) ?DME Agency: NA ?  ?  ?  ?HH Arranged: Disease Management, RN, PT, Nurse's Aide, OT, Social Work ?[place] Agency: [facility]) ?Date HH Agency Contacted: 02/26/22 ?Time [place]: 1030 ?Representative spoke with at [place]: [name] ? ? ?Social Determinants of Health (SDOH) Interventions ?  ? ?Readmission Risk Interventions ? ?  02/26/2022  ? 10:30 AM 02/22/2022  ?  1:48 PM  ?Readmission Risk Prevention Plan  ?Transportation Screening Complete Complete  ?Medication Review Press photographer) Complete Complete  ?PCP or Specialist appointment within 3-5 days of discharge Complete Complete  ?[place] or Home Care Consult Complete Complete  ?SW Recovery Care/Counseling  Consult Complete Complete  ?Palliative Care Screening Not Applicable Not Applicable  ?[place] Not Applicable Not Applicable  ? ? ?

## 2022-03-08 ENCOUNTER — Ambulatory Visit (HOSPITAL_COMMUNITY): Payer: No Typology Code available for payment source | Admitting: Physician Assistant

## 2022-03-08 NOTE — Progress Notes (Incomplete)
Primary Care Physician: [name] Primary Cardiologist: Dr Johney Frame  Primary Electrophysiologist: Dr Quentin Ore Referring Physician: [facility]   Carrie [name] is a 81 y.o. female with a history of follicular lymphoma, HTN, CKD, atrial fibrillation who presents for follow up in the [facility].  The patient was initially diagnosed with atrial fibrillation 2016 in the setting of hypovolemia and electrolyte imbalances. She did well until 02/2019 when she had another episodes of afib with RVR and underwent DCCV. Echo showed EF 20-25% and she was started on amiodarone. It was unclear if the drop in EF was due to afib or her chemotherapy. Her EF normalized 3 months later. Patient is on Eliquis for a CHADS2VASC score of 6. She is no longer on amiodarone. She presented for an infusion at her oncologist but was found to be in rapid afib. She was sent to the ED and underwent DCCV.   On follow up today, patient has been hospitalized several times since her last visit. She was hospitalized with afib in setting of UTI and was loaded on amiodarone with DCCV 02/17/22 but she had early return of afib. She was hospitalized again 02/21/22 and a rate control strategy was pursued. ***  Today, she denies symptoms of ***palpitations, chest pain, shortness of breath, orthopnea, PND, lower extremity edema, dizziness, presyncope, syncope, snoring, daytime somnolence, bleeding, or neurologic sequela. The patient is tolerating medications without difficulties and is otherwise without complaint today.    Atrial Fibrillation Risk Factors:  she does not have symptoms or diagnosis of sleep apnea. she does not have a history of rheumatic fever. she does not have a history of alcohol use. The patient does not have a history of early familial atrial fibrillation or other arrhythmias.  she has a BMI of There is no height or weight on file to calculate BMI.. There were no vitals filed  for this visit.   Family History  Problem Relation Age of Onset   Cancer Mother        Breast, lung NHL   Cancer Sister        Multiple myeloma     Atrial Fibrillation Management history:  Previous antiarrhythmic drugs: flecainide, amiodarone  Previous cardioversions: 02/2019, 09/15/21, 02/17/22 Previous ablations: none CHADS2VASC score: 6 Anticoagulation history: Eliquis   Past Medical History:  Diagnosis Date   A-fib ([place])    Anemia    pt denies    Atrial fibrillation with rapid ventricular response ([place]) 06/25/2015   Carpal tunnel syndrome, bilateral    pt denies    Cervical spondylosis without myelopathy 10/25/2013   Dental bridge present    DJD (degenerative joint disease)    Dyspnea    sometimes hx of cancer non hodgkins lymphoma    Dysrhythmia    WENT INTO A FIB IN 2017    Elevated troponin 02/21/3148   Follicular lymphoma grade 3a ([place]) 02/03/2015   hx of nonhodgkins lymphoma x 3    Headache    sometimes a headache    History of echocardiogram    Echo 12/16: EF 60-65%, no RWMA, severe LAE   History of nuclear stress test    Myoview 1/17: EF 55%, Normal study. No ischemia or scar.   Hypertension    Hypothyroid    Nausea without vomiting 06/20/2015   Stage III chronic kidney disease ([place]) 07/01/2015   Thrush of mouth and esophagus ([place]) 06/25/2015   Past Surgical History:  Procedure Laterality Date   ABDOMINAL  HYSTERECTOMY     APPENDECTOMY     BACK SURGERY     X5-lumbar-fusion   CARDIOVERSION N/A 02/17/2022   Procedure: CARDIOVERSION;  Surgeon: Berniece Salines, DO;  Location: [facility];  Service: Cardiovascular;  Laterality: N/A;   COLONOSCOPY     DILATION AND CURETTAGE OF UTERUS     LYMPH NODE BIOPSY Right 01/21/2015   Procedure: RIGHT GROIN LYMPH NODE BIOPSY;  Surgeon: Erroll Luna, MD;  Location: [facility];  Service: General;  Laterality: Right;   MASS EXCISION Left 08/29/2018   Procedure: EXCISION LEFT BACK  MASS;  Surgeon: Erroll Luna, MD;   Location: [facility];  Service: General;  Laterality: Left;   Ovarian cyst resection     patelar tendon transplants     Left/right   PORTACATH PLACEMENT Right 02/13/2015   Procedure: INSERTION PORT-A-CATH WITH ULTRASOUND;  Surgeon: Erroll Luna, MD;  Location: [facility];  Service: General;  Laterality: Right;   PORTACATH PLACEMENT N/A 08/29/2018   Procedure: INSERTION PORT-A-CATH WITH ULTRA SOUND ERAS PATHWAY;  Surgeon: Erroll Luna, MD;  Location: [facility];  Service: General;  Laterality: N/A;   REVERSE SHOULDER ARTHROPLASTY Left 03/02/2021   Procedure: REVERSE SHOULDER ARTHROPLASTY;  Surgeon: Netta Cedars, MD;  Location: [facility];  Service: Orthopedics;  Laterality: Left;   RIGHT/LEFT HEART CATH AND CORONARY ANGIOGRAPHY N/A 01/15/2022   Procedure: RIGHT/LEFT HEART CATH AND CORONARY ANGIOGRAPHY;  Surgeon: [name], MD;  Location: [facility];  Service: Cardiovascular;  Laterality: N/A;   TOTAL KNEE ARTHROPLASTY  2011   Right   TUBAL LIGATION      Current Outpatient Medications  Medication Sig Dispense Refill   acetaminophen (TYLENOL) 500 MG tablet Take 500 mg by mouth every 6 (six) hours as needed for mild pain or headache.     amiodarone (PACERONE) 200 MG tablet Take 1 tab (200 mg total) twice daily for 1 week and then reduce to 1 tablet (200 mg total) once daily. (Patient taking differently: Take 200 mg by mouth in the morning.) 45 tablet 1   apixaban (ELIQUIS) 5 MG TABS tablet Take 5 mg by mouth 2 (two) times daily.      furosemide (LASIX) 40 MG tablet Take 1 tablet (40 mg total) by mouth daily. 30 tablet 1   levothyroxine (SYNTHROID) 88 MCG tablet Take 1 tablet (88 mcg total) by mouth daily before breakfast. 30 tablet 0   lidocaine-prilocaine (EMLA) cream Apply 1 application topically daily as needed. (Patient taking differently: Apply 1 application. topically daily as needed (for port access).) 30 g 3   metoprolol tartrate (LOPRESSOR) 50 MG tablet Take 1 tablet (50 mg total) by  mouth 2 (two) times daily. 60 tablet 0   polyvinyl alcohol (LIQUIFILM TEARS) 1.4 % ophthalmic solution Place 1 drop into both eyes 3 (three) times daily as needed for dry eyes.     traZODone (DESYREL) 50 MG tablet Take 1 tablet (50 mg total) by mouth at bedtime. 30 tablet 0   No current facility-administered medications for this visit.   Facility-Administered Medications Ordered in Other Visits  Medication Dose Route Frequency Provider Last Rate Last Admin   sodium chloride 0.9 % injection 10 mL  10 mL Intravenous PRN Alvy Bimler, Ni, MD   10 mL at 04/05/17 1315    Allergies  Allergen Reactions   Amiodarone Other (See Comments)    "Hyperthyroidism"....BUT, the patient is taking this (2023)    Social History   Socioeconomic History   Marital status: Widowed  Spouse name: Not on file   Number of children: 2   Years of education: hs   Highest education level: Not on file  Occupational History   Occupation: Retired  Tobacco Use   Smoking status: Never   Smokeless tobacco: Never  Vaping Use   Vaping Use: Never used  Substance and Sexual Activity   Alcohol use: Not Currently   Drug use: No   Sexual activity: Not on file  Other Topics Concern   Not on file  Social History Narrative   Lives alone.  Has a walker for home use.   Social Determinants of Health   Financial Resource Strain: Not on file  Food Insecurity: Not on file  Transportation Needs: Not on file  Physical Activity: Not on file  Stress: Not on file  Social Connections: Not on file  Intimate Partner Violence: Not on file     ROS- All systems are reviewed and negative except as per the HPI above.  Physical Exam: There were no vitals filed for this visit.   GEN- The patient is a well appearing *** {Desc; female/female:[id number]}, alert and oriented x 3 today.   HEENT-head normocephalic, atraumatic, sclera clear, conjunctiva pink, hearing intact, trachea midline. Lungs- Clear to ausculation bilaterally, normal  work of breathing Heart- ***Regular rate and rhythm, no murmurs, rubs or gallops  GI- soft, NT, ND, + BS Extremities- no clubbing, cyanosis, or edema MS- no significant deformity or atrophy Skin- no rash or lesion Psych- euthymic mood, full affect Neuro- strength and sensation are intact   Wt Readings from Last 3 Encounters:  03/03/22 59 kg  02/17/22 63.7 kg  02/02/22 62 kg    EKG today demonstrates  ***  Echo 12/31/21 demonstrated   1. Inferior basal hypokinesis . Left ventricular ejection fraction, by  estimation, is 50 to 55%. The left ventricle has low normal function. The  left ventricle demonstrates regional wall motion abnormalities (see  scoring diagram/findings for description). Left ventricular diastolic parameters were normal.   2. No morphologic signs of pulmonary HTN Estimated PA systolic pressure only mildly elevated 38 mmHg . Right ventricular systolic function is normal. The right ventricular size is normal. There is normal pulmonary artery systolic pressure.   3. Left atrial size was mildly dilated.   4. The mitral valve is abnormal. Mild mitral valve regurgitation. No  evidence of mitral stenosis.   5. Tricuspid valve regurgitation is mild to moderate.   6. The aortic valve is normal in structure. There is moderate  calcification of the aortic valve. Aortic valve regurgitation is mild.  Aortic valve sclerosis/calcification is present, without any evidence of  aortic stenosis.   7. The inferior vena cava is normal in size with greater than 50%  respiratory variability, suggesting right atrial pressure of 3 mmHg.   Epic records are reviewed at length today  CHA2DS2-VASc Score = 6  The patient's score is based upon: CHF History: 1 HTN History: 1 Diabetes History: 0 Stroke History: 0 Vascular Disease History: 1 Age Score: 2 Gender Score: 1       ASSESSMENT AND PLAN: 1. Permanent atrial fibrillation  The patient's CHA2DS2-VASc score is 6, indicating a  9.7% annual risk of stroke.   *** Continue amiodarone 200 mg daily for rate control only.  Check LFTs today. Not a candidate for invasive EP procedures.  Continue Eliquis 5 mg BID Continue Lopressor 50 mg BID  2. Secondary Hypercoagulable State (ICD10:  D68.69) The patient is at significant risk for  stroke/thromboembolism based upon her CHA2DS2-VASc Score of 6.  Continue Apixaban (Eliquis).   3. Systolic dysfunction Appears euvolemic today. ***  4. HTN Stable, no changes today. ***   Follow up ***with Dr Johney Frame as scheduled. AF clinic in 6 months.    [facility] [address] [place], [place] [address] [phone] 03/08/2022 9:07 AM

## 2022-04-18 ENCOUNTER — Inpatient Hospital Stay (HOSPITAL_COMMUNITY): Payer: No Typology Code available for payment source

## 2022-04-18 ENCOUNTER — Other Ambulatory Visit: Payer: Self-pay

## 2022-04-18 ENCOUNTER — Emergency Department (HOSPITAL_COMMUNITY): Payer: No Typology Code available for payment source

## 2022-04-18 ENCOUNTER — Inpatient Hospital Stay (HOSPITAL_COMMUNITY)
Admission: EM | Admit: 2022-04-18 | Discharge: 2022-05-18 | DRG: 853 | Disposition: E | Payer: No Typology Code available for payment source | Source: Skilled Nursing Facility | Attending: Family Medicine | Admitting: Family Medicine

## 2022-04-18 ENCOUNTER — Encounter (HOSPITAL_COMMUNITY): Payer: Self-pay

## 2022-04-18 DIAGNOSIS — C8238 Follicular lymphoma grade IIIa, lymph nodes of multiple sites: Secondary | ICD-10-CM | POA: Diagnosis present

## 2022-04-18 DIAGNOSIS — J9621 Acute and chronic respiratory failure with hypoxia: Secondary | ICD-10-CM | POA: Diagnosis present

## 2022-04-18 DIAGNOSIS — Z8616 Personal history of COVID-19: Secondary | ICD-10-CM

## 2022-04-18 DIAGNOSIS — I130 Hypertensive heart and chronic kidney disease with heart failure and stage 1 through stage 4 chronic kidney disease, or unspecified chronic kidney disease: Secondary | ICD-10-CM | POA: Diagnosis present

## 2022-04-18 DIAGNOSIS — J189 Pneumonia, unspecified organism: Secondary | ICD-10-CM | POA: Diagnosis present

## 2022-04-18 DIAGNOSIS — S72002A Fracture of unspecified part of neck of left femur, initial encounter for closed fracture: Secondary | ICD-10-CM | POA: Diagnosis not present

## 2022-04-18 DIAGNOSIS — N179 Acute kidney failure, unspecified: Secondary | ICD-10-CM | POA: Diagnosis not present

## 2022-04-18 DIAGNOSIS — Y92009 Unspecified place in unspecified non-institutional (private) residence as the place of occurrence of the external cause: Principal | ICD-10-CM

## 2022-04-18 DIAGNOSIS — Z66 Do not resuscitate: Secondary | ICD-10-CM | POA: Diagnosis not present

## 2022-04-18 DIAGNOSIS — R571 Hypovolemic shock: Secondary | ICD-10-CM | POA: Diagnosis not present

## 2022-04-18 DIAGNOSIS — M810 Age-related osteoporosis without current pathological fracture: Secondary | ICD-10-CM | POA: Diagnosis present

## 2022-04-18 DIAGNOSIS — K828 Other specified diseases of gallbladder: Secondary | ICD-10-CM | POA: Diagnosis present

## 2022-04-18 DIAGNOSIS — E440 Moderate protein-calorie malnutrition: Secondary | ICD-10-CM | POA: Diagnosis present

## 2022-04-18 DIAGNOSIS — Y92129 Unspecified place in nursing home as the place of occurrence of the external cause: Secondary | ICD-10-CM

## 2022-04-18 DIAGNOSIS — Z6824 Body mass index (BMI) 24.0-24.9, adult: Secondary | ICD-10-CM

## 2022-04-18 DIAGNOSIS — S72142A Displaced intertrochanteric fracture of left femur, initial encounter for closed fracture: Secondary | ICD-10-CM | POA: Diagnosis present

## 2022-04-18 DIAGNOSIS — Z79899 Other long term (current) drug therapy: Secondary | ICD-10-CM

## 2022-04-18 DIAGNOSIS — Z96651 Presence of right artificial knee joint: Secondary | ICD-10-CM | POA: Diagnosis present

## 2022-04-18 DIAGNOSIS — I959 Hypotension, unspecified: Secondary | ICD-10-CM | POA: Diagnosis not present

## 2022-04-18 DIAGNOSIS — E861 Hypovolemia: Secondary | ICD-10-CM | POA: Diagnosis present

## 2022-04-18 DIAGNOSIS — Z515 Encounter for palliative care: Secondary | ICD-10-CM

## 2022-04-18 DIAGNOSIS — N390 Urinary tract infection, site not specified: Secondary | ICD-10-CM | POA: Diagnosis present

## 2022-04-18 DIAGNOSIS — R6520 Severe sepsis without septic shock: Secondary | ICD-10-CM | POA: Diagnosis present

## 2022-04-18 DIAGNOSIS — D6869 Other thrombophilia: Secondary | ICD-10-CM | POA: Diagnosis not present

## 2022-04-18 DIAGNOSIS — W19XXXA Unspecified fall, initial encounter: Principal | ICD-10-CM

## 2022-04-18 DIAGNOSIS — F419 Anxiety disorder, unspecified: Secondary | ICD-10-CM | POA: Diagnosis present

## 2022-04-18 DIAGNOSIS — A419 Sepsis, unspecified organism: Principal | ICD-10-CM | POA: Diagnosis present

## 2022-04-18 DIAGNOSIS — I10 Essential (primary) hypertension: Secondary | ICD-10-CM | POA: Diagnosis present

## 2022-04-18 DIAGNOSIS — E871 Hypo-osmolality and hyponatremia: Secondary | ICD-10-CM | POA: Diagnosis present

## 2022-04-18 DIAGNOSIS — D72819 Decreased white blood cell count, unspecified: Secondary | ICD-10-CM | POA: Diagnosis present

## 2022-04-18 DIAGNOSIS — B9789 Other viral agents as the cause of diseases classified elsewhere: Secondary | ICD-10-CM | POA: Diagnosis present

## 2022-04-18 DIAGNOSIS — I5A Non-ischemic myocardial injury (non-traumatic): Secondary | ICD-10-CM | POA: Diagnosis present

## 2022-04-18 DIAGNOSIS — R1314 Dysphagia, pharyngoesophageal phase: Secondary | ICD-10-CM | POA: Diagnosis present

## 2022-04-18 DIAGNOSIS — N1832 Chronic kidney disease, stage 3b: Secondary | ICD-10-CM | POA: Diagnosis present

## 2022-04-18 DIAGNOSIS — I480 Paroxysmal atrial fibrillation: Secondary | ICD-10-CM | POA: Diagnosis present

## 2022-04-18 DIAGNOSIS — I272 Pulmonary hypertension, unspecified: Secondary | ICD-10-CM | POA: Diagnosis present

## 2022-04-18 DIAGNOSIS — I5032 Chronic diastolic (congestive) heart failure: Secondary | ICD-10-CM | POA: Diagnosis present

## 2022-04-18 DIAGNOSIS — Z86010 Personal history of colonic polyps: Secondary | ICD-10-CM

## 2022-04-18 DIAGNOSIS — Z7901 Long term (current) use of anticoagulants: Secondary | ICD-10-CM

## 2022-04-18 DIAGNOSIS — E876 Hypokalemia: Secondary | ICD-10-CM | POA: Diagnosis present

## 2022-04-18 DIAGNOSIS — D6859 Other primary thrombophilia: Secondary | ICD-10-CM | POA: Diagnosis present

## 2022-04-18 DIAGNOSIS — I2510 Atherosclerotic heart disease of native coronary artery without angina pectoris: Secondary | ICD-10-CM | POA: Diagnosis not present

## 2022-04-18 DIAGNOSIS — E785 Hyperlipidemia, unspecified: Secondary | ICD-10-CM | POA: Diagnosis present

## 2022-04-18 DIAGNOSIS — E875 Hyperkalemia: Secondary | ICD-10-CM | POA: Diagnosis not present

## 2022-04-18 DIAGNOSIS — I509 Heart failure, unspecified: Secondary | ICD-10-CM | POA: Diagnosis not present

## 2022-04-18 DIAGNOSIS — D62 Acute posthemorrhagic anemia: Secondary | ICD-10-CM | POA: Diagnosis not present

## 2022-04-18 DIAGNOSIS — E038 Other specified hypothyroidism: Secondary | ICD-10-CM | POA: Diagnosis not present

## 2022-04-18 DIAGNOSIS — K2289 Other specified disease of esophagus: Secondary | ICD-10-CM | POA: Diagnosis not present

## 2022-04-18 DIAGNOSIS — R7989 Other specified abnormal findings of blood chemistry: Secondary | ICD-10-CM | POA: Diagnosis present

## 2022-04-18 DIAGNOSIS — G9341 Metabolic encephalopathy: Secondary | ICD-10-CM | POA: Diagnosis present

## 2022-04-18 DIAGNOSIS — I9581 Postprocedural hypotension: Secondary | ICD-10-CM | POA: Diagnosis not present

## 2022-04-18 DIAGNOSIS — R0902 Hypoxemia: Secondary | ICD-10-CM

## 2022-04-18 DIAGNOSIS — G319 Degenerative disease of nervous system, unspecified: Secondary | ICD-10-CM | POA: Diagnosis present

## 2022-04-18 DIAGNOSIS — K222 Esophageal obstruction: Secondary | ICD-10-CM | POA: Diagnosis not present

## 2022-04-18 DIAGNOSIS — R7401 Elevation of levels of liver transaminase levels: Secondary | ICD-10-CM | POA: Diagnosis not present

## 2022-04-18 DIAGNOSIS — I482 Chronic atrial fibrillation, unspecified: Secondary | ICD-10-CM | POA: Diagnosis present

## 2022-04-18 DIAGNOSIS — I110 Hypertensive heart disease with heart failure: Secondary | ICD-10-CM | POA: Diagnosis not present

## 2022-04-18 DIAGNOSIS — S72009A Fracture of unspecified part of neck of unspecified femur, initial encounter for closed fracture: Secondary | ICD-10-CM | POA: Diagnosis present

## 2022-04-18 DIAGNOSIS — Z807 Family history of other malignant neoplasms of lymphoid, hematopoietic and related tissues: Secondary | ICD-10-CM

## 2022-04-18 DIAGNOSIS — R519 Headache, unspecified: Secondary | ICD-10-CM | POA: Diagnosis present

## 2022-04-18 DIAGNOSIS — K7200 Acute and subacute hepatic failure without coma: Secondary | ICD-10-CM | POA: Diagnosis not present

## 2022-04-18 DIAGNOSIS — G5603 Carpal tunnel syndrome, bilateral upper limbs: Secondary | ICD-10-CM | POA: Diagnosis present

## 2022-04-18 DIAGNOSIS — Z888 Allergy status to other drugs, medicaments and biological substances status: Secondary | ICD-10-CM

## 2022-04-18 DIAGNOSIS — N189 Chronic kidney disease, unspecified: Secondary | ICD-10-CM | POA: Diagnosis present

## 2022-04-18 DIAGNOSIS — E039 Hypothyroidism, unspecified: Secondary | ICD-10-CM | POA: Diagnosis present

## 2022-04-18 DIAGNOSIS — I5030 Unspecified diastolic (congestive) heart failure: Secondary | ICD-10-CM | POA: Diagnosis not present

## 2022-04-18 DIAGNOSIS — Z96612 Presence of left artificial shoulder joint: Secondary | ICD-10-CM | POA: Diagnosis present

## 2022-04-18 DIAGNOSIS — R1310 Dysphagia, unspecified: Secondary | ICD-10-CM

## 2022-04-18 DIAGNOSIS — M25552 Pain in left hip: Secondary | ICD-10-CM | POA: Diagnosis present

## 2022-04-18 DIAGNOSIS — R053 Chronic cough: Secondary | ICD-10-CM | POA: Diagnosis present

## 2022-04-18 DIAGNOSIS — N1831 Chronic kidney disease, stage 3a: Secondary | ICD-10-CM | POA: Diagnosis present

## 2022-04-18 DIAGNOSIS — Z90710 Acquired absence of both cervix and uterus: Secondary | ICD-10-CM

## 2022-04-18 DIAGNOSIS — Z9221 Personal history of antineoplastic chemotherapy: Secondary | ICD-10-CM

## 2022-04-18 DIAGNOSIS — W010XXA Fall on same level from slipping, tripping and stumbling without subsequent striking against object, initial encounter: Secondary | ICD-10-CM | POA: Diagnosis present

## 2022-04-18 DIAGNOSIS — Z79890 Hormone replacement therapy (postmenopausal): Secondary | ICD-10-CM

## 2022-04-18 LAB — COMPREHENSIVE METABOLIC PANEL
ALT: 20 U/L (ref 0–44)
AST: 25 U/L (ref 15–41)
Albumin: 2.8 g/dL — ABNORMAL LOW (ref 3.5–5.0)
Alkaline Phosphatase: 88 U/L (ref 38–126)
Anion gap: 14 (ref 5–15)
BUN: 18 mg/dL (ref 8–23)
CO2: 32 mmol/L (ref 22–32)
Calcium: 8.3 mg/dL — ABNORMAL LOW (ref 8.9–10.3)
Chloride: 86 mmol/L — ABNORMAL LOW (ref 98–111)
Creatinine, Ser: 1.19 mg/dL — ABNORMAL HIGH (ref 0.44–1.00)
GFR, Estimated: 47 mL/min — ABNORMAL LOW (ref >60)
Glucose, Bld: 173 mg/dL — ABNORMAL HIGH (ref 70–99)
Potassium: 2.9 mmol/L — ABNORMAL LOW (ref 3.5–5.1)
Sodium: 132 mmol/L — ABNORMAL LOW (ref 135–145)
Total Bilirubin: 1.1 mg/dL (ref 0.3–1.2)
Total Protein: 6.4 g/dL — ABNORMAL LOW (ref 6.5–8.1)

## 2022-04-18 LAB — CBC WITH DIFFERENTIAL/PLATELET
Abs Immature Granulocytes: 0.27 10*3/uL — ABNORMAL HIGH (ref 0.00–0.07)
Basophils Absolute: 0 10*3/uL (ref 0.0–0.1)
Basophils Relative: 0 %
Eosinophils Absolute: 0 10*3/uL (ref 0.0–0.5)
Eosinophils Relative: 0 %
HCT: 28.5 % — ABNORMAL LOW (ref 36.0–46.0)
Hemoglobin: 9 g/dL — ABNORMAL LOW (ref 12.0–15.0)
Immature Granulocytes: 10 %
Lymphocytes Relative: 4 %
Lymphs Abs: 0.1 10*3/uL — ABNORMAL LOW (ref 0.7–4.0)
MCH: 26.9 pg (ref 26.0–34.0)
MCHC: 31.6 g/dL (ref 30.0–36.0)
MCV: 85.1 fL (ref 80.0–100.0)
Monocytes Absolute: 0.2 10*3/uL (ref 0.1–1.0)
Monocytes Relative: 6 %
Neutro Abs: 2.1 10*3/uL (ref 1.7–7.7)
Neutrophils Relative %: 80 %
Platelets: 259 10*3/uL (ref 150–400)
RBC: 3.35 MIL/uL — ABNORMAL LOW (ref 3.87–5.11)
RDW: 17.8 % — ABNORMAL HIGH (ref 11.5–15.5)
WBC: 2.7 10*3/uL — ABNORMAL LOW (ref 4.0–10.5)
nRBC: 0 % (ref 0.0–0.2)

## 2022-04-18 LAB — BASIC METABOLIC PANEL
Anion gap: 12 (ref 5–15)
Anion gap: 13 (ref 5–15)
BUN: 16 mg/dL (ref 8–23)
BUN: 15 mg/dL (ref 8–23)
CO2: 32 mmol/L (ref 22–32)
CO2: 27 mmol/L (ref 22–32)
Calcium: 7.9 mg/dL — ABNORMAL LOW (ref 8.9–10.3)
Calcium: 7.5 mg/dL — ABNORMAL LOW (ref 8.9–10.3)
Chloride: 88 mmol/L — ABNORMAL LOW (ref 98–111)
Chloride: 88 mmol/L — ABNORMAL LOW (ref 98–111)
Creatinine, Ser: 1.22 mg/dL — ABNORMAL HIGH (ref 0.44–1.00)
Creatinine, Ser: 1.2 mg/dL — ABNORMAL HIGH (ref 0.44–1.00)
GFR, Estimated: 45 mL/min — ABNORMAL LOW (ref >60)
GFR, Estimated: 46 mL/min — ABNORMAL LOW (ref >60)
Glucose, Bld: 163 mg/dL — ABNORMAL HIGH (ref 70–99)
Glucose, Bld: 159 mg/dL — ABNORMAL HIGH (ref 70–99)
Potassium: 3.1 mmol/L — ABNORMAL LOW (ref 3.5–5.1)
Potassium: 3.8 mmol/L (ref 3.5–5.1)
Sodium: 132 mmol/L — ABNORMAL LOW (ref 135–145)
Sodium: 128 mmol/L — ABNORMAL LOW (ref 135–145)

## 2022-04-18 LAB — URINALYSIS, ROUTINE W REFLEX MICROSCOPIC
Bilirubin Urine: NEGATIVE
Glucose, UA: NEGATIVE mg/dL
Ketones, ur: 5 mg/dL — AB
Nitrite: NEGATIVE
Protein, ur: NEGATIVE mg/dL
Specific Gravity, Urine: 1.012 (ref 1.005–1.030)
WBC, UA: >50 WBC/hpf — ABNORMAL HIGH (ref 0–5)
pH: 5 (ref 5.0–8.0)

## 2022-04-18 LAB — MAGNESIUM
Magnesium: 1.5 mg/dL — ABNORMAL LOW (ref 1.7–2.4)
Magnesium: 3.4 mg/dL — ABNORMAL HIGH (ref 1.7–2.4)

## 2022-04-18 LAB — SARS CORONAVIRUS 2 BY RT PCR: SARS Coronavirus 2 by RT PCR: NEGATIVE

## 2022-04-18 LAB — BRAIN NATRIURETIC PEPTIDE: B Natriuretic Peptide: 646.3 pg/mL — ABNORMAL HIGH (ref 0.0–100.0)

## 2022-04-18 LAB — TROPONIN I (HIGH SENSITIVITY): Troponin I (High Sensitivity): 31 ng/L — ABNORMAL HIGH (ref <18)

## 2022-04-18 MED ORDER — FENTANYL CITRATE PF 50 MCG/ML IJ SOSY
50.0000 ug | PREFILLED_SYRINGE | Freq: Once | INTRAMUSCULAR | Status: AC
  Administered 2022-04-18: 50 ug via INTRAVENOUS
  Filled 2022-04-18: qty 1

## 2022-04-18 MED ORDER — POTASSIUM CHLORIDE 10 MEQ/100ML IV SOLN
10.0000 meq | INTRAVENOUS | Status: AC
  Administered 2022-04-18 (×4): 10 meq via INTRAVENOUS
  Filled 2022-04-18 (×5): qty 100

## 2022-04-18 MED ORDER — SODIUM CHLORIDE (PF) 0.9 % IJ SOLN
INTRAMUSCULAR | Status: AC
  Filled 2022-04-18: qty 50

## 2022-04-18 MED ORDER — LEVOTHYROXINE SODIUM 88 MCG PO TABS
88.0000 ug | ORAL_TABLET | Freq: Every day | ORAL | Status: DC
  Administered 2022-04-20 – 2022-04-27 (×4): 88 ug via ORAL
  Filled 2022-04-18 (×7): qty 1

## 2022-04-18 MED ORDER — CHLORHEXIDINE GLUCONATE CLOTH 2 % EX PADS
6.0000 | MEDICATED_PAD | Freq: Every day | CUTANEOUS | Status: DC
  Administered 2022-04-19 – 2022-04-26 (×7): 6 via TOPICAL

## 2022-04-18 MED ORDER — MORPHINE SULFATE (PF) 2 MG/ML IV SOLN
0.5000 mg | INTRAVENOUS | Status: DC | PRN
  Administered 2022-04-19: 0.5 mg via INTRAVENOUS
  Filled 2022-04-18: qty 1

## 2022-04-18 MED ORDER — IOHEXOL 350 MG/ML SOLN
75.0000 mL | Freq: Once | INTRAVENOUS | Status: AC | PRN
  Administered 2022-04-18: 75 mL via INTRAVENOUS

## 2022-04-18 MED ORDER — IPRATROPIUM-ALBUTEROL 0.5-2.5 (3) MG/3ML IN SOLN
3.0000 mL | Freq: Four times a day (QID) | RESPIRATORY_TRACT | Status: DC | PRN
  Filled 2022-04-18: qty 3

## 2022-04-18 MED ORDER — SODIUM CHLORIDE 0.9% FLUSH: 10.0000 mL | INTRAVENOUS | Status: DC | PRN

## 2022-04-18 MED ORDER — SODIUM CHLORIDE 0.9 % IV SOLN
1.0000 g | INTRAVENOUS | Status: DC
  Administered 2022-04-19 – 2022-04-22 (×5): 1 g via INTRAVENOUS
  Filled 2022-04-18 (×9): qty 10

## 2022-04-18 MED ORDER — POTASSIUM CHLORIDE 10 MEQ/100ML IV SOLN
10.0000 meq | INTRAVENOUS | Status: AC
  Administered 2022-04-19 (×2): 10 meq via INTRAVENOUS

## 2022-04-18 MED ORDER — AMIODARONE HCL 200 MG PO TABS
200.0000 mg | ORAL_TABLET | Freq: Every day | ORAL | Status: DC
Start: 2022-04-19 — End: 2022-04-19

## 2022-04-18 MED ORDER — SODIUM CHLORIDE 0.9 % IV SOLN
500.0000 mg | INTRAVENOUS | Status: AC
  Administered 2022-04-19 – 2022-04-22 (×5): 500 mg via INTRAVENOUS
  Filled 2022-04-18 (×5): qty 5

## 2022-04-18 MED ORDER — MAGNESIUM SULFATE 4 GM/100ML IV SOLN
4.0000 g | Freq: Once | INTRAVENOUS | Status: AC
  Administered 2022-04-18: 4 g via INTRAVENOUS
  Filled 2022-04-18: qty 100

## 2022-04-18 MED ORDER — POLYETHYLENE GLYCOL 3350 17 G PO PACK: 17.0000 g | PACK | Freq: Every day | ORAL | Status: DC | PRN

## 2022-04-18 NOTE — ED Notes (Signed)
Pt cleaned up of urinary and bowel incontinence. Pt tolerated well. Purewick placed. Pt placed in hospital gown and linens changed. Daughter provided with belongings to take home.

## 2022-04-18 NOTE — ED Notes (Signed)
Pt transported to CT via stretcher.  

## 2022-04-18 NOTE — H&P (Addendum)
History and Physical    Carrie [name] HBZ:[id number] DOB: 10-24-1940 DOA: 04/23/2022  PCP: [name]  Patient coming from: [facility]  I have personally briefly reviewed patient's old medical records in [place]  Chief Complaint: fall with left hip pain  HPI: Carrie [name] is a 81 y.o. female with medical history significant of  PAF s/p DCCV 5/3 with ERAF, chronic HFpEF, Follicular lymphoma stage IIIa s/p chemotherapy with pancytopenia, hypothyroidism, HTN, HLD, stage IIIb CKD, and chronic cough since recovering from covid-19 infection early 2023 on chronic O2 of 3L at NH,  who presents to ed from NH s/p unwitnessed fall with complaint of left hip pain. Patient currently denies n/v/d/ abdominal pain or feels of sob or chest pain but notes that pain is a 7/10.  Daughter at bedside is concerned re he mental status as she has had a significant change from her baseline 6 weeks ago.  She notes her mother is more confused but does not note any history of focal weakness. She notes in reference to her O2 there has been an attempt to wean patient from O2 at [facility]  but this has not been successful.  ED Course:   Vitals: 97.4, BP 142/66, hr 82 , rr 18  sat 95 on 2-5L ( new hypoxemia) Course complicated by continued desat with increasing O2 requirement 85% 4L Covid negative BNP 646 prior 800's Na 132 (130), K 2.9  cr 1.19 at baseline Wbc:2.7, hgb 9 ( base 9-10) rwd 17.8 Cthead Atrophy with small vessel chronic ischemic changes of deep cerebral white matter.   No acute intracranial abnormalities.   Multilevel degenerative disc and facet disease changes of the cervical spine.   No acute cervical spine abnormalities.  Xray left fermur IMPRESSION: 1. Comminuted and angulated intertrochanteric left femur fracture as described. 2. No evidence of acute pelvic fracture. Degenerative and postsurgical changes as described.   UA:+ >50 wbc   Cxr IMPRESSION: No evidence  of acute chest injury or definite active cardiopulmonary process. Interstitial prominence noted previously has improved. Tx 63mg  Review of Systems: As per HPI otherwise 10 point review of systems negative.   Past Medical History:  Diagnosis Date   A-fib ([facility]    Anemia    pt denies    Atrial fibrillation with rapid ventricular response ([facility] 06/25/2015   Carpal tunnel syndrome, bilateral    pt denies    Cervical spondylosis without myelopathy 10/25/2013   Dental bridge present    DJD (degenerative joint disease)    Dyspnea    sometimes hx of cancer non hodgkins lymphoma    Dysrhythmia    WENT INTO A FIB IN 2017    Elevated troponin 51/0/1751  Follicular lymphoma grade 3a ([facility] 02/03/2015   hx of nonhodgkins lymphoma x 3    Headache    sometimes a headache    History of echocardiogram    Echo 12/16: EF 60-65%, no RWMA, severe LAE   History of nuclear stress test    Myoview 1/17: EF 55%, Normal study. No ischemia or scar.   Hypertension    Hypothyroid    Nausea without vomiting 06/20/2015   Stage III chronic kidney disease ([facility] 07/01/2015   Thrush of mouth and esophagus ([facility] 06/25/2015    Past Surgical History:  Procedure Laterality Date   ABDOMINAL HYSTERECTOMY     APPENDECTOMY     BACK SURGERY     X5-lumbar-fusion   CARDIOVERSION N/A 02/17/2022   Procedure:  CARDIOVERSION;  Surgeon: Berniece Salines, DO;  Location: [place];  Service: Cardiovascular;  Laterality: N/A;   COLONOSCOPY     DILATION AND CURETTAGE OF UTERUS     LYMPH NODE BIOPSY Right 01/21/2015   Procedure: RIGHT GROIN LYMPH NODE BIOPSY;  Surgeon: Erroll Luna, MD;  Location: [place];  Service: General;  Laterality: Right;   MASS EXCISION Left 08/29/2018   Procedure: EXCISION LEFT BACK  MASS;  Surgeon: Erroll Luna, MD;  Location: [place];  Service: General;  Laterality: Left;   Ovarian cyst resection     patelar tendon transplants     Left/right   PORTACATH PLACEMENT Right 02/13/2015    Procedure: INSERTION PORT-A-CATH WITH ULTRASOUND;  Surgeon: Erroll Luna, MD;  Location: [place];  Service: General;  Laterality: Right;   PORTACATH PLACEMENT N/A 08/29/2018   Procedure: INSERTION PORT-A-CATH WITH ULTRA SOUND ERAS PATHWAY;  Surgeon: Erroll Luna, MD;  Location: [place];  Service: General;  Laterality: N/A;   REVERSE SHOULDER ARTHROPLASTY Left 03/02/2021   Procedure: REVERSE SHOULDER ARTHROPLASTY;  Surgeon: Netta Cedars, MD;  [facility];  Service: Orthopedics;  Laterality: Left;   RIGHT/LEFT HEART CATH AND CORONARY ANGIOGRAPHY N/A 01/15/2022   Procedure: RIGHT/LEFT HEART CATH AND CORONARY ANGIOGRAPHY;  Surgeon: [name], MD;  Location: [place] [facility];  Service: Cardiovascular;  Laterality: N/A;   TOTAL KNEE ARTHROPLASTY  2011   Right   TUBAL LIGATION       reports that she has never smoked. She has never used smokeless tobacco. She reports that she does not currently use alcohol. She reports that she does not use drugs.  Allergies  Allergen Reactions   Amiodarone Other (See Comments)    "Hyperthyroidism"....BUT, the patient is taking this (2023)    Family History  Problem Relation Age of Onset   Cancer Mother        Breast, lung NHL   Cancer Sister        Multiple myeloma    Prior to Admission medications   Medication Sig Start Date End Date Taking? Authorizing Provider  acetaminophen (TYLENOL) 500 MG tablet Take 500 mg by mouth every 6 (six) hours as needed for mild pain or headache.   Yes [provider]  amiodarone (PACERONE) 200 MG tablet Take 1 tab (200 mg total) twice daily for 1 week and then reduce to 1 tablet (200 mg total) once daily. Patient taking differently: Take 200 mg by mouth daily. 02/18/22  Yes Hongalgi, Lenis Dickinson, MD  apixaban (ELIQUIS) 5 MG TABS tablet Take 5 mg by mouth 2 (two) times daily.    Yes [provider]  furosemide (LASIX) 40 MG tablet Take 1 tablet (40 mg total) by mouth daily. 03/04/22  Yes Little Ishikawa, MD  ipratropium-albuterol (DUONEB) 0.5-2.5 (3) MG/3ML SOLN Take 3 mLs by nebulization every 6 (six) hours as needed.   Yes [provider]  levothyroxine (SYNTHROID) 88 MCG tablet Take 1 tablet (88 mcg total) by mouth daily before breakfast. 01/24/22  Yes Lavina Hamman, MD  lidocaine-prilocaine (EMLA) cream Apply 1 application topically daily as needed. Patient taking differently: Apply 1 application  topically daily as needed (for port access). 04/07/21  Yes Gorsuch, Ni, MD  metoprolol tartrate (LOPRESSOR) 50 MG tablet Take 1 tablet (50 mg total) by mouth 2 (two) times daily. 03/03/22   Little Ishikawa, MD  polyvinyl alcohol (LIQUIFILM TEARS) 1.4 % ophthalmic solution Place 1 drop into both eyes 3 (three) times  daily as needed for dry eyes.    [provider]  traZODone (DESYREL) 50 MG tablet Take 1 tablet (50 mg total) by mouth at bedtime. 03/03/22   Little Ishikawa, MD    Physical Exam: Vitals:   05/11/2022 0819 05/10/2022 0941 04/22/2022 1030 04/30/2022 1045  BP:  107/79 113/80 104/81  Pulse: (!) 102 80 (!) 105 (!) 109  Resp: (!) 28 19 (!) 29 (!) 26  Temp:      SpO2: 97% 92% 96% 98%  Weight:      Height:        Vitals:   05/13/2022 0819 04/22/2022 0941 05/11/2022 1030 05/01/2022 1045  BP:  107/79 113/80 104/81  Pulse: (!) 102 80 (!) 105 (!) 109  Resp: (!) 28 19 (!) 29 (!) 26  Temp:      SpO2: 97% 92% 96% 98%  Weight:      Height:       Constitutional: NAD, calm, comfortable Eyes: PERRL, lids and conjunctivae normal ENMT: Mucous membranes are moist. Posterior pharynx clear of any exudate or lesions.Normal dentition.  Neck: normal, supple, no masses, no thyromegaly Respiratory: clear to auscultation bilaterally, no wheezing, no crackles. Normal respiratory effort. No accessory muscle use.  Cardiovascular: Regular rate and rhythm, no murmurs / rubs / gallops. No extremity edema. 2+ pedal pulses.  Abdomen: no tenderness, no masses palpated. No  hepatosplenomegaly. Bowel sounds positive.  Musculoskeletal: no clubbing / cyanosis. Left extremity externally rotates and shorter than right.  Good ROM, no contractures. Normal muscle tone.  Skin: no rashes, lesions, ulcers. No induration Neurologic: CN 2-12 grossly intact. Sensation intact, Strength 5/5 in all 4.  Psychiatric: Normal judgment and insight. Alert and oriented to self , Normal mood.    Labs on Admission: I have personally reviewed following labs and imaging studies  CBC: Recent Labs  Lab 05/14/2022 0834  WBC 2.7*  NEUTROABS 2.1  HGB 9.0*  HCT 28.5*  MCV 85.1  PLT 623   Basic Metabolic Panel: Recent Labs  Lab 05/17/2022 0834  NA 132*  K 2.9*  CL 86*  CO2 32  GLUCOSE 173*  BUN 18  CREATININE 1.19*  CALCIUM 8.3*   GFR: Estimated Creatinine Clearance: 31.6 mL/min (A) (by C-G formula based on SCr of 1.19 mg/dL (H)). Liver Function Tests: Recent Labs  Lab 05/08/2022 0834  AST 25  ALT 20  ALKPHOS 88  BILITOT 1.1  PROT 6.4*  ALBUMIN 2.8*   No results for input(s): "LIPASE", "AMYLASE" in the last 168 hours. No results for input(s): "AMMONIA" in the last 168 hours. Coagulation Profile: No results for input(s): "INR", "PROTIME" in the last 168 hours. Cardiac Enzymes: No results for input(s): "CKTOTAL", "CKMB", "CKMBINDEX", "TROPONINI" in the last 168 hours. BNP (last 3 results) No results for input(s): "PROBNP" in the last 8760 hours. HbA1C: No results for input(s): "HGBA1C" in the last 72 hours. CBG: No results for input(s): "GLUCAP" in the last 168 hours. Lipid Profile: No results for input(s): "CHOL", "HDL", "LDLCALC", "TRIG", "CHOLHDL", "LDLDIRECT" in the last 72 hours. Thyroid Function Tests: No results for input(s): "TSH", "T4TOTAL", "FREET4", "T3FREE", "THYROIDAB" in the last 72 hours. Anemia Panel: No results for input(s): "VITAMINB12", "FOLATE", "FERRITIN", "TIBC", "IRON", "RETICCTPCT" in the last 72 hours. Urine analysis:    Component Value  Date/Time   COLORURINE YELLOW 02/21/2022 1340   APPEARANCEUR CLEAR 02/21/2022 1340   LABSPEC 1.010 02/21/2022 1340   LABSPEC 1.005 03/18/2017 1315   PHURINE 5.0 02/21/2022 1340   GLUCOSEU  NEGATIVE 02/21/2022 1340   GLUCOSEU Negative 03/18/2017 1315   HGBUR SMALL (A) 02/21/2022 1340   BILIRUBINUR NEGATIVE 02/21/2022 1340   BILIRUBINUR Negative 03/18/2017 [place] 02/21/2022 1340   PROTEINUR NEGATIVE 02/21/2022 1340   UROBILINOGEN 0.2 03/18/2017 1315   NITRITE NEGATIVE 02/21/2022 1340   LEUKOCYTESUR NEGATIVE 02/21/2022 1340   LEUKOCYTESUR Negative 03/18/2017 1315    Radiological Exams on Admission: DG Chest Portable 1 View  Result Date: 04/17/2022 CLINICAL DATA:  Status post fall.  Left hip fracture. EXAM: PORTABLE CHEST 1 VIEW COMPARISON:  [place] 03/01/2022 and 02/26/2022.  CT 12/30/2021. FINDINGS: 0837 hours. Right IJ Port-A-Cath extends to the mid SVC level, stable. The heart size and mediastinal contours are stable. The interstitial opacities seen previously have largely cleared. There may be mild residual underlying fibrosis. No confluent airspace opacity, edema, pleural effusion or pneumothorax. No acute fractures are identified in the chest. There is a left shoulder reverse arthroplasty. Telemetry leads overlie the chest. IMPRESSION: No evidence of acute chest injury or definite active cardiopulmonary process. Interstitial prominence noted previously has improved. Electronically Signed   By: Richardean Sale M.D.   On: 05/10/2022 09:29   DG Femur Min 2 Views Left  Result Date: 05/04/2022 CLINICAL DATA:  Fall this morning. Shortening and rotation of the left leg. EXAM: PELVIS - 1-2 VIEW; LEFT FEMUR 2 VIEWS COMPARISON:  Pelvic radiographs 12/12/2007. FINDINGS: Acute intertrochanteric left femur fracture with associated comminution and varus angulation. No dislocation with significant left hip arthropathy. There are tricompartmental degenerative changes at the left knee.  Mild degenerative changes are present at the right hip, and there are degenerative and postsurgical changes in the lower lumbar spine. The sacroiliac joints and symphysis pubis appear intact. Soft tissue swelling around the proximal left hip fracture. IMPRESSION: 1. Comminuted and angulated intertrochanteric left femur fracture as described. 2. No evidence of acute pelvic fracture. Degenerative and postsurgical changes as described. Electronically Signed   By: Richardean Sale M.D.   On: 05/01/2022 09:27   DG Pelvis 1-2 Views  Result Date: 04/23/2022 CLINICAL DATA:  Fall this morning. Shortening and rotation of the left leg. EXAM: PELVIS - 1-2 VIEW; LEFT FEMUR 2 VIEWS COMPARISON:  Pelvic radiographs 12/12/2007. FINDINGS: Acute intertrochanteric left femur fracture with associated comminution and varus angulation. No dislocation with significant left hip arthropathy. There are tricompartmental degenerative changes at the left knee. Mild degenerative changes are present at the right hip, and there are degenerative and postsurgical changes in the lower lumbar spine. The sacroiliac joints and symphysis pubis appear intact. Soft tissue swelling around the proximal left hip fracture. IMPRESSION: 1. Comminuted and angulated intertrochanteric left femur fracture as described. 2. No evidence of acute pelvic fracture. Degenerative and postsurgical changes as described. Electronically Signed   By: Richardean Sale M.D.   On: 05/08/2022 09:27   CT HEAD WO CONTRAST (5MM)  Result Date: 05/17/2022 CLINICAL DATA:  Neck and head trauma moderate to severe, fall. History atrial fibrillation, stage III chronic kidney disease, hypertension, past history lymphoma EXAM: CT HEAD WITHOUT CONTRAST CT CERVICAL SPINE WITHOUT CONTRAST TECHNIQUE: Multidetector CT imaging of the head and cervical spine was performed following the standard protocol without intravenous contrast. Multiplanar CT image reconstructions of the cervical spine were  also generated. RADIATION DOSE REDUCTION: This exam was performed according to the departmental dose-optimization program which includes automated exposure control, adjustment of the mA and/or kV according to patient size and/or use of iterative reconstruction technique. COMPARISON:  02/02/2022 FINDINGS: CT  HEAD FINDINGS Brain: Mild motion artifacts degrade exam. Generalized atrophy. Normal ventricular morphology. No midline shift or mass effect. Small vessel chronic ischemic changes of deep cerebral white matter. No intracranial hemorrhage, mass lesion, evidence of acute infarction, or extra-axial fluid collection. Vascular: No hyperdense vessels. Atherosclerotic calcification of internal carotid arteries at skull base Skull: Skull base poorly visualized due to motion artifacts, but adequately visualized on cervical spine CT. Calvaria intact. Sinuses/Orbits: Mucosal thickening LEFT maxillary sinus. Small amount of fluid and mucus within sphenoid sinus. Other: N/A CT CERVICAL SPINE FINDINGS Alignment: Anterolisthesis C3-C4. Retrolisthesis C5-C6. Remaining alignments normal. Skull base and vertebrae: Osseous demineralization. Skull base intact. Vertebral body heights maintained. Multilevel disc space narrowing and endplate spur formation C3-C4 through C6-C7. Scattered facet degenerative changes. No fracture, additional subluxation, or bone destruction. Incomplete posterior arch of C1 noted, developmental anomaly. Soft tissues and spinal canal: Prevertebral soft tissues normal thickness. Disc levels:  No specific abnormalities Upper chest: Lung apices clear Other: RIGHT jugular line. Atherosclerotic calcification aortic arch. IMPRESSION: Atrophy with small vessel chronic ischemic changes of deep cerebral white matter. No acute intracranial abnormalities. Multilevel degenerative disc and facet disease changes of the cervical spine. No acute cervical spine abnormalities. Aortic Atherosclerosis (ICD10-I70.0).  Electronically Signed   By: Lavonia Dana M.D.   On: 04/17/2022 09:08   CT Cervical Spine Wo Contrast  Result Date: 04/22/2022 CLINICAL DATA:  Neck and head trauma moderate to severe, fall. History atrial fibrillation, stage III chronic kidney disease, hypertension, past history lymphoma EXAM: CT HEAD WITHOUT CONTRAST CT CERVICAL SPINE WITHOUT CONTRAST TECHNIQUE: Multidetector CT imaging of the head and cervical spine was performed following the standard protocol without intravenous contrast. Multiplanar CT image reconstructions of the cervical spine were also generated. RADIATION DOSE REDUCTION: This exam was performed according to the departmental dose-optimization program which includes automated exposure control, adjustment of the mA and/or kV according to patient size and/or use of iterative reconstruction technique. COMPARISON:  02/02/2022 FINDINGS: CT HEAD FINDINGS Brain: Mild motion artifacts degrade exam. Generalized atrophy. Normal ventricular morphology. No midline shift or mass effect. Small vessel chronic ischemic changes of deep cerebral white matter. No intracranial hemorrhage, mass lesion, evidence of acute infarction, or extra-axial fluid collection. Vascular: No hyperdense vessels. Atherosclerotic calcification of internal carotid arteries at skull base Skull: Skull base poorly visualized due to motion artifacts, but adequately visualized on cervical spine CT. Calvaria intact. Sinuses/Orbits: Mucosal thickening LEFT maxillary sinus. Small amount of fluid and mucus within sphenoid sinus. Other: N/A CT CERVICAL SPINE FINDINGS Alignment: Anterolisthesis C3-C4. Retrolisthesis C5-C6. Remaining alignments normal. Skull base and vertebrae: Osseous demineralization. Skull base intact. Vertebral body heights maintained. Multilevel disc space narrowing and endplate spur formation C3-C4 through C6-C7. Scattered facet degenerative changes. No fracture, additional subluxation, or bone destruction. Incomplete  posterior arch of C1 noted, developmental anomaly. Soft tissues and spinal canal: Prevertebral soft tissues normal thickness. Disc levels:  No specific abnormalities Upper chest: Lung apices clear Other: RIGHT jugular line. Atherosclerotic calcification aortic arch. IMPRESSION: Atrophy with small vessel chronic ischemic changes of deep cerebral white matter. No acute intracranial abnormalities. Multilevel degenerative disc and facet disease changes of the cervical spine. No acute cervical spine abnormalities. Aortic Atherosclerosis (ICD10-I70.0). Electronically Signed   By: Lavonia Dana M.D.   On: 05/08/2022 09:08    EKG: Independently reviewed. p  Assessment/Plan  Left femur fracture s/p fall  -plan for OR in am  -npo midnight  -place on hip protocol  -hold anticoagulation  -supportive pain medication  -  Dr Noralee Chars consulted from Orthopedics  UTI with SIRS -start ctx  -blood cultures ordered  -urine cultures pending   Acute on Chronic hypoxic respiratory failure  -insetting of recent pna 5/23, prior to which patient had COVID  -acute hypoxemia possible new infection vs due to pain medications in setting of prior lung injury  -await CTPA notes no PE but noted infiltrate unclear if new or old -will check inflammatory markers , urinary antigens  -patient requiring 5L , baseline 3, was able to be weaned in ED toward baseline  -abg also pending  -will need optimization prior to OR  -patient not cleared at this time for or but possibly by am based on respiratory status at that time  Possible PNA -will start abx due to new O2 requirment -can de-escalate in am  - f/u blood  cultures /sputum /full respiratory panel  -inflammatory markers    Hypokalemia Hypomagnesemia - replete now and prn  -follow labs   Abn CE -demand, will cycle to be complete  -ekg pending  Elevated BNP  -CT scan ? Edema vs infection infiltrate  -bnp was elevated prior  -once electrolytes stable can possible  give trial of lasix   Confusion from baseline  Mild Encephalopathy nos  - possible ? Hypoxic injury /cva / possible uti -neuro checks  -MRI pending  -of note neuro exam nonfocal  other than ? Confusion   P afib with CVR -Followed by EP -Continue metoprolol - Continue amiodarone 242m BID  - On eliquis will hold due to planned OR time   Stage IIIia follicular lymphoma -followed by oncology   CKDIIIb -at baseline   Chronic hyponatremia -at baseline  -continue to monitor labs   Hyothyrodism -continue synthroid  DVT prophylaxis: on full dose AC , on hold for planned OR time Code Status: Full Family Communication:  Disposition Plan: patient  expected to be admitted greater than 2 midnights ) Consults called: Orthopedics Admission status: progressive care   SClance BollMD [facility]   If 7PM-7AM, please contact night-coverage [email or web address] Password [email or web address]  05/15/2022, 11:13 AM

## 2022-04-18 NOTE — Progress Notes (Signed)
Carrie [name](dgt) [phone]

## 2022-04-18 NOTE — ED Provider Notes (Incomplete)
Carrie [name] is a 81 y.o. female with a past medical history significant for hypothyroidism, CKD, diabetes, hypertension, atrial fibrillation on Eliquis therapy, CHF, pulmonary hypertension, and anxiety who presents with a fall with hip injury.  According to patient, she had a mechanical fall this morning about 7 AM and fell onto the ground onto her left side.  She is not sure if she hit her head but denies loss of conscious.  Is denying headache or neck pain.  She is primary complaining of 8 out of 10 pain in her left hip area and is deformed and shortened.  Patient denies any numbness or weakness but could not walk on it.  She reports has broken her right hip before but has not injured her left hip.  She says that she has had a cough for the last few days and on my initial assessment oxygen saturations are in the 80s.  She is placed on oxygen.  On my exam, patient has tenderness and deformity to her left hip and has pain with palpation in her left pelvis area.  Lungs were overall clear and there was a slight murmur.  Intact sensation, strength, and pulses in extremities.  Patient remains on 4 L nasal cannula.  We will get imaging of her head and neck due to her fall on Eliquis and will get imaging of her hip and femur.  We will get chest x-ray for the hypoxia and cough and for likely preoperative management and we will get screening labs.  We will give fentanyl for pain control.  Anticipate admission after work-up is completed.  I personally viewed and interpreted the images and it does show evidence of acute comminuted hip fracture on the left side.  X-ray did not show a large pneumonia and CT head and neck did not show acute traumatic injuries either.  Patient's oxygen saturations dipped again into the 80s and she is now on 5 L nasal cannula.  She will be admitted for hip fracture and new hypoxia.  X-ray did not show pneumonia with the patient's cough, unclear etiology of her hypoxia.  Will discuss  with admitting team if they feel she needs further imaging to rule out things like fat emboli due to the long bone fracture and the new hypoxia.  Patient has been seen by [facility] in the past when her shoulder was operated on by Dr. Alma Friendly.  We will call [facility] for recommendations and then hospitalist for admission.  Also note, family reports that the patient has had some worsened mental status and confusion and they are concerned she could have had a stroke within the last 5 weeks.  We will discuss with medicine team if they feel she needs MRI of the brain or other imaging during her admission.  10:31 AM Just spoke to Dr. Alvan Dame with [facility] and he feels that she needs to be admitted to medicine for optimization and hold anticoagulation today.  She can be n.p.o. at midnight for likely surgery tomorrow.  Will admit to medicine.

## 2022-04-18 NOTE — Progress Notes (Signed)
Primary nurse called provider Stark Klein, NP for guidance on electrolyte replacement. Patient has been having runs of Vtach  per telemetry. Patient was transferred to bedside monitor from telemetry box.  Patient received 4 runs of potassium already. Provider has requested that primary nurse pause potassium replacement at this time. BNP and mag lab orders placed. Patient's port was also accessed. Will continue to monitor.

## 2022-04-18 NOTE — Progress Notes (Signed)
Call made to rapid response nurse Ram[place]. Primary nurse called for an evaluation at bedside for the rapid response nurse, because the patient is showing a decrease in BP and increase respirations. Erin, RN is in route to assess patient. No new orders at this time. Will continue to monitor.

## 2022-04-18 NOTE — ED Triage Notes (Signed)
BIBA from [facility] for fall from standing position. Unwitnessed. Shortening and rotation to left leg. Pain 4/10 to left hip 2lpm [place] at baseline. Prescribed eliquis

## 2022-04-18 NOTE — Progress Notes (Signed)
Pt has a cellphone and glasses( dgt to take ear rings

## 2022-04-18 NOTE — Progress Notes (Signed)
   04/26/2022 1902  Assess: MEWS Score  Temp 98.9 F (37.2 C)  BP 119/85  MAP (mmHg) 98  Pulse Rate (!) 108  Resp (!) 30  Level of Consciousness Alert  SpO2 93 %  O2 Device Nasal Cannula  O2 Flow Rate (L/min) 4 L/min  Assess: MEWS Score  MEWS Temp 0  MEWS Systolic 0  MEWS Pulse 1  MEWS RR 2  MEWS LOC 0  MEWS Score 3  MEWS Score Color Yellow  Assess: if the MEWS score is Yellow or Red  Were vital signs taken at a resting state? Yes  Focused Assessment No change from prior assessment  Does the patient meet 2 or more of the SIRS criteria? Yes  Does the patient have a confirmed or suspected source of infection? Yes  Provider and Rapid Response Notified?  (provider notified)  MEWS guidelines implemented *See Row Information* Yes  Treat  Pain Scale Faces  Faces Pain Scale 0  Take Vital Signs  Increase Vital Sign Frequency  Yellow: Q 2hr X 2 then Q 4hr X 2, if remains yellow, continue Q 4hrs  Escalate  MEWS: Escalate Yellow: discuss with charge nurse/RN and consider discussing with provider and RRT  Notify: Charge Nurse/RN  Name of Charge Nurse/RN Notified Rise Paganini, RN  Date Charge Nurse/RN Notified 04/23/2022  Time Charge Nurse/RN Notified 1909  Notify: Provider  Provider Name/Title Myles Rosenthal, MD  Date Provider Notified 04/20/2022  Time Provider Notified 1910  Method of Notification  (secure chat)  Notification Reason Change in status  Assess: SIRS CRITERIA  SIRS Temperature  0  SIRS Pulse 1  SIRS Respirations  1  SIRS WBC 1  SIRS Score Sum  3

## 2022-04-18 NOTE — Progress Notes (Signed)
Primary nurse Yong Channel, RN was informed that patient had some runs of V-tach. IV consult placed. See Mar for interventions. Will continue to monitor.

## 2022-04-18 NOTE — Progress Notes (Signed)
Primary nurse gave updates to daughter Urban Gibson) on patient's conditions. Will continue to monitor.

## 2022-04-18 NOTE — ED Notes (Signed)
Pt. Having 3-beat runs of V-tach via ECG monitor, Provider notified and aware.

## 2022-04-18 NOTE — ED Provider Notes (Signed)
[facility] DEPT Provider Note   CSN: [id number] Arrival date & time: 05/03/2022  0630     History  Chief Complaint  Patient presents with   Carrie [name] is a 81 y.o. female with history of HTN, CHF, and a-fib that presents from her nursing facility after an unwitnessed fall this morning around 0600. The patient states that she tripped and fell onto her left side and was unable to catch herself. The patient denies experiencing dizziness or SOB prior to falling. The patient denies hitting her head or experiencing LOC after falling. The patient is complaining of 8/10 pain in her left hip that worsens with movement and does not radiate. The patient states that she has had a cough over the last couple of days. No other transforming factors.The patient denies headache, neck pain, chest pain, palpitations, abdominal pain, nausea, vomiting, diarrhea, fever, chills. The patient denies history of DM, COPD, or using oxygen at home.    The history is provided by the patient and medical records.  Fall Pertinent negatives include no chest pain, no abdominal pain, no headaches and no shortness of breath.       Home Medications Prior to Admission medications   Medication Sig Start Date End Date Taking? Authorizing Provider  acetaminophen (TYLENOL) 500 MG tablet Take 500 mg by mouth every 6 (six) hours as needed for mild pain or headache.    [provider]  amiodarone (PACERONE) 200 MG tablet Take 1 tab (200 mg total) twice daily for 1 week and then reduce to 1 tablet (200 mg total) once daily. Patient taking differently: Take 200 mg by mouth in the morning. 02/18/22   Hongalgi, Lenis Dickinson, MD  apixaban (ELIQUIS) 5 MG TABS tablet Take 5 mg by mouth 2 (two) times daily.     [provider]  furosemide (LASIX) 40 MG tablet Take 1 tablet (40 mg total) by mouth daily. 03/04/22   Little Ishikawa, MD  levothyroxine (SYNTHROID) 88 MCG tablet Take 1  tablet (88 mcg total) by mouth daily before breakfast. 01/24/22   Lavina Hamman, MD  lidocaine-prilocaine (EMLA) cream Apply 1 application topically daily as needed. Patient taking differently: Apply 1 application. topically daily as needed (for port access). 04/07/21   [name], MD  metoprolol tartrate (LOPRESSOR) 50 MG tablet Take 1 tablet (50 mg total) by mouth 2 (two) times daily. 03/03/22   Little Ishikawa, MD  polyvinyl alcohol (LIQUIFILM TEARS) 1.4 % ophthalmic solution Place 1 drop into both eyes 3 (three) times daily as needed for dry eyes.    [provider]  traZODone (DESYREL) 50 MG tablet Take 1 tablet (50 mg total) by mouth at bedtime. 03/03/22   Little Ishikawa, MD      Allergies    Amiodarone    Review of Systems   Review of Systems  Constitutional:  Negative for chills and fever.  Respiratory:  Positive for cough. Negative for shortness of breath.   Cardiovascular:  Negative for chest pain and palpitations.  Gastrointestinal:  Negative for abdominal pain, diarrhea, nausea and vomiting.  Musculoskeletal:  Negative for back pain and neck pain.       Positive for left hip pain.   Neurological:  Negative for dizziness, syncope and headaches.    Physical Exam Updated Vital Signs BP (!) 149/70   Pulse (!) 59   Temp (!) 97.4 F (36.3 C)   Resp 18   Ht 5'  1" (1.549 m)   Wt 59 kg   SpO2 91%   BMI 24.58 kg/m  Physical Exam Constitutional:      Comments: Drowsy but arousable.   HENT:     Head: Normocephalic and atraumatic.  Cardiovascular:     Rate and Rhythm: Normal rate. Rhythm irregular.     Pulses: Normal pulses.     Heart sounds: Normal heart sounds.  Pulmonary:     Effort: Pulmonary effort is normal.     Breath sounds: Normal breath sounds. No wheezing, rhonchi or rales.  Abdominal:     Palpations: Abdomen is soft.     Tenderness: There is no abdominal tenderness.  Musculoskeletal:     Cervical back: Normal range of motion.     Right  lower leg: No edema.     Left lower leg: No edema.     Comments: LLE is shortened and externally rotated. Closed deformity of the left hip. Tenderness of the left hip on palpation.  Skin:    General: Skin is warm and dry.     Comments: No bruising or lacerations.   Neurological:     General: No focal deficit present.     Sensory: No sensory deficit.     Motor: No weakness.     ED Results / Procedures / Treatments   Labs (all labs ordered are listed, but only abnormal results are displayed) Labs Reviewed  SARS CORONAVIRUS 2 BY RT PCR  CBC WITH DIFFERENTIAL/PLATELET  COMPREHENSIVE METABOLIC PANEL  BRAIN NATRIURETIC PEPTIDE    EKG None  Radiology No results found.  Procedures Procedures    Medications Ordered in ED Medications  fentaNYL (SUBLIMAZE) injection 50 mcg (has no administration in time range)    ED Course/ Medical Decision Making/ A&P                           Medical Decision Making Amount and/or Complexity of Data Reviewed Labs: ordered. Radiology: ordered.  Risk Prescription drug management. Decision regarding hospitalization.    Carrie [name] is a 81 y.o. female with history of HTN, CHF, and a-fib that presents after a fall at her nursing facility on Eliquis. Her physical exam is concerning for a potential fracture of the left femur. Will order CT head, CT spine, CXR, XRAY of the hip/pelvis, and XRAY of the left femur to assess for additional injuries. Will order CBC, CMP, BNP, EKG, and cardiac monitoring in case surgery is necessary. Will order Covid swab given the patients cough. Patient will be NPO in case surgery is necessary. Will order fentanyl for pain. Will consult ortho and admit if fracture is found on imaging. Patient agrees with the plan. Patient discussed with Dr. Sherry Ruffing.    11:02 AM  [facility] consulted. Will admit to medicine for left femur fracture.         Final Clinical Impression(s) / ED Diagnoses Final diagnoses:   None    Rx / DC Orders ED Discharge Orders     None         Earnesteen Birnie, Claudia Desanctis, MD 05/05/2022 1502    Tegeler, Gwenyth Allegra, MD 04/22/22 [phone]

## 2022-04-19 ENCOUNTER — Encounter (HOSPITAL_COMMUNITY): Payer: Self-pay | Admitting: Internal Medicine

## 2022-04-19 ENCOUNTER — Inpatient Hospital Stay (HOSPITAL_COMMUNITY): Payer: No Typology Code available for payment source

## 2022-04-19 ENCOUNTER — Inpatient Hospital Stay (HOSPITAL_COMMUNITY): Payer: No Typology Code available for payment source | Admitting: Anesthesiology

## 2022-04-19 ENCOUNTER — Other Ambulatory Visit: Payer: Self-pay

## 2022-04-19 ENCOUNTER — Encounter (HOSPITAL_COMMUNITY): Admission: EM | Disposition: E | Payer: Self-pay | Source: Skilled Nursing Facility | Attending: Family Medicine

## 2022-04-19 DIAGNOSIS — I509 Heart failure, unspecified: Secondary | ICD-10-CM | POA: Diagnosis not present

## 2022-04-19 DIAGNOSIS — J9621 Acute and chronic respiratory failure with hypoxia: Secondary | ICD-10-CM | POA: Insufficient documentation

## 2022-04-19 DIAGNOSIS — S72002A Fracture of unspecified part of neck of left femur, initial encounter for closed fracture: Secondary | ICD-10-CM | POA: Diagnosis not present

## 2022-04-19 DIAGNOSIS — I2510 Atherosclerotic heart disease of native coronary artery without angina pectoris: Secondary | ICD-10-CM

## 2022-04-19 DIAGNOSIS — D6869 Other thrombophilia: Secondary | ICD-10-CM | POA: Diagnosis not present

## 2022-04-19 DIAGNOSIS — D62 Acute posthemorrhagic anemia: Secondary | ICD-10-CM | POA: Diagnosis not present

## 2022-04-19 DIAGNOSIS — I110 Hypertensive heart disease with heart failure: Secondary | ICD-10-CM | POA: Diagnosis not present

## 2022-04-19 DIAGNOSIS — S72142A Displaced intertrochanteric fracture of left femur, initial encounter for closed fracture: Secondary | ICD-10-CM | POA: Diagnosis not present

## 2022-04-19 DIAGNOSIS — G9341 Metabolic encephalopathy: Secondary | ICD-10-CM | POA: Diagnosis not present

## 2022-04-19 DIAGNOSIS — N390 Urinary tract infection, site not specified: Secondary | ICD-10-CM | POA: Insufficient documentation

## 2022-04-19 DIAGNOSIS — J189 Pneumonia, unspecified organism: Secondary | ICD-10-CM

## 2022-04-19 DIAGNOSIS — E871 Hypo-osmolality and hyponatremia: Secondary | ICD-10-CM

## 2022-04-19 DIAGNOSIS — I5A Non-ischemic myocardial injury (non-traumatic): Secondary | ICD-10-CM

## 2022-04-19 HISTORY — PX: INTRAMEDULLARY (IM) NAIL INTERTROCHANTERIC: SHX5875

## 2022-04-19 LAB — CBC
HCT: 21.7 % — ABNORMAL LOW (ref 36.0–46.0)
HCT: 28.8 % — ABNORMAL LOW (ref 36.0–46.0)
Hemoglobin: 6.7 g/dL — CL (ref 12.0–15.0)
Hemoglobin: 9.3 g/dL — ABNORMAL LOW (ref 12.0–15.0)
MCH: 26.5 pg (ref 26.0–34.0)
MCH: 27.1 pg (ref 26.0–34.0)
MCHC: 30.9 g/dL (ref 30.0–36.0)
MCHC: 32.3 g/dL (ref 30.0–36.0)
MCV: 85.8 fL (ref 80.0–100.0)
MCV: 84 fL (ref 80.0–100.0)
Platelets: 235 10*3/uL (ref 150–400)
Platelets: 190 10*3/uL (ref 150–400)
RBC: 2.53 MIL/uL — ABNORMAL LOW (ref 3.87–5.11)
RBC: 3.43 MIL/uL — ABNORMAL LOW (ref 3.87–5.11)
RDW: 18.4 % — ABNORMAL HIGH (ref 11.5–15.5)
RDW: 17.8 % — ABNORMAL HIGH (ref 11.5–15.5)
WBC: 2.7 10*3/uL — ABNORMAL LOW (ref 4.0–10.5)
WBC: 2.5 10*3/uL — ABNORMAL LOW (ref 4.0–10.5)
nRBC: 0 % (ref 0.0–0.2)
nRBC: 0 % (ref 0.0–0.2)

## 2022-04-19 LAB — BASIC METABOLIC PANEL
Anion gap: 12 (ref 5–15)
BUN: 18 mg/dL (ref 8–23)
CO2: 28 mmol/L (ref 22–32)
Calcium: 7.5 mg/dL — ABNORMAL LOW (ref 8.9–10.3)
Chloride: 91 mmol/L — ABNORMAL LOW (ref 98–111)
Creatinine, Ser: 1.34 mg/dL — ABNORMAL HIGH (ref 0.44–1.00)
GFR, Estimated: 40 mL/min — ABNORMAL LOW (ref >60)
Glucose, Bld: 183 mg/dL — ABNORMAL HIGH (ref 70–99)
Potassium: 3.9 mmol/L (ref 3.5–5.1)
Sodium: 131 mmol/L — ABNORMAL LOW (ref 135–145)

## 2022-04-19 LAB — MRSA NEXT GEN BY PCR, NASAL: MRSA by PCR Next Gen: NOT DETECTED

## 2022-04-19 LAB — RESPIRATORY PANEL BY PCR

## 2022-04-19 LAB — STREP PNEUMONIAE URINARY ANTIGEN: Strep Pneumo Urinary Antigen: NEGATIVE

## 2022-04-19 LAB — TROPONIN I (HIGH SENSITIVITY): Troponin I (High Sensitivity): 40 ng/L — ABNORMAL HIGH (ref <18)

## 2022-04-19 LAB — BRAIN NATRIURETIC PEPTIDE: B Natriuretic Peptide: 602.1 pg/mL — ABNORMAL HIGH (ref 0.0–100.0)

## 2022-04-19 LAB — VITAMIN D 25 HYDROXY (VIT D DEFICIENCY, FRACTURES): Vit D, 25-Hydroxy: 19.23 ng/mL — ABNORMAL LOW (ref 30–100)

## 2022-04-19 LAB — PREPARE RBC (CROSSMATCH)

## 2022-04-19 SURGERY — FIXATION, FRACTURE, INTERTROCHANTERIC, WITH INTRAMEDULLARY ROD
Anesthesia: General | Laterality: Left

## 2022-04-19 MED ORDER — AMISULPRIDE (ANTIEMETIC) 5 MG/2ML IV SOLN: 5.0000 mg | Freq: Once | INTRAVENOUS | Status: DC | PRN

## 2022-04-19 MED ORDER — APIXABAN 5 MG PO TABS: 5.0000 mg | ORAL_TABLET | Freq: Two times a day (BID) | ORAL | Status: DC

## 2022-04-19 MED ORDER — SODIUM CHLORIDE 0.9 % IR SOLN
Status: DC | PRN
  Administered 2022-04-19: 1000 mL

## 2022-04-19 MED ORDER — ONDANSETRON HCL 4 MG/2ML IJ SOLN
INTRAMUSCULAR | Status: AC
  Filled 2022-04-19: qty 2

## 2022-04-19 MED ORDER — LIDOCAINE HCL (PF) 2 % IJ SOLN
INTRAMUSCULAR | Status: AC
  Filled 2022-04-19: qty 5

## 2022-04-19 MED ORDER — MORPHINE SULFATE (PF) 2 MG/ML IV SOLN
0.5000 mg | INTRAVENOUS | Status: DC | PRN
  Administered 2022-04-21 – 2022-04-27 (×3): 1 mg via INTRAVENOUS
  Filled 2022-04-19 (×3): qty 1

## 2022-04-19 MED ORDER — EPHEDRINE SULFATE-NACL 50-0.9 MG/10ML-% IV SOSY
PREFILLED_SYRINGE | INTRAVENOUS | Status: DC | PRN
  Administered 2022-04-19: 15 mg via INTRAVENOUS
  Administered 2022-04-19 (×2): 5 mg via INTRAVENOUS

## 2022-04-19 MED ORDER — METOCLOPRAMIDE HCL 5 MG/ML IJ SOLN: 5.0000 mg | Freq: Three times a day (TID) | INTRAMUSCULAR | Status: DC | PRN

## 2022-04-19 MED ORDER — PROPOFOL 10 MG/ML IV BOLUS
INTRAVENOUS | Status: AC
  Filled 2022-04-19: qty 20

## 2022-04-19 MED ORDER — METHOCARBAMOL 500 MG PO TABS: 500.0000 mg | ORAL_TABLET | Freq: Four times a day (QID) | ORAL | Status: DC | PRN

## 2022-04-19 MED ORDER — SODIUM CHLORIDE 0.9 % IV SOLN: INTRAVENOUS | Status: DC | PRN

## 2022-04-19 MED ORDER — ORAL CARE MOUTH RINSE: 15.0000 mL | OROMUCOSAL | Status: DC | PRN

## 2022-04-19 MED ORDER — LACTATED RINGERS IV SOLN: INTRAVENOUS | Status: DC

## 2022-04-19 MED ORDER — SODIUM CHLORIDE 0.9% IV SOLUTION: Freq: Once | INTRAVENOUS | Status: AC

## 2022-04-19 MED ORDER — PHENYLEPHRINE 80 MCG/ML (10ML) SYRINGE FOR IV PUSH (FOR BLOOD PRESSURE SUPPORT)
PREFILLED_SYRINGE | INTRAVENOUS | Status: DC | PRN
  Administered 2022-04-19: 80 ug via INTRAVENOUS
  Administered 2022-04-19 (×2): 160 ug via INTRAVENOUS
  Administered 2022-04-19: 200 ug via INTRAVENOUS
  Administered 2022-04-19: 160 ug via INTRAVENOUS
  Administered 2022-04-19: 80 ug via INTRAVENOUS

## 2022-04-19 MED ORDER — CHLORHEXIDINE GLUCONATE 0.12 % MT SOLN
15.0000 mL | Freq: Once | OROMUCOSAL | Status: AC
  Administered 2022-04-19: 15 mL via OROMUCOSAL

## 2022-04-19 MED ORDER — ACETAMINOPHEN 325 MG PO TABS: 325.0000 mg | ORAL_TABLET | Freq: Four times a day (QID) | ORAL | Status: DC | PRN

## 2022-04-19 MED ORDER — ONDANSETRON HCL 4 MG PO TABS: 4.0000 mg | ORAL_TABLET | Freq: Four times a day (QID) | ORAL | Status: DC | PRN

## 2022-04-19 MED ORDER — ROCURONIUM BROMIDE 10 MG/ML (PF) SYRINGE
PREFILLED_SYRINGE | INTRAVENOUS | Status: DC | PRN
  Administered 2022-04-19: 50 mg via INTRAVENOUS

## 2022-04-19 MED ORDER — BISACODYL 10 MG RE SUPP: 10.0000 mg | Freq: Every day | RECTAL | Status: DC | PRN

## 2022-04-19 MED ORDER — METHOCARBAMOL 1000 MG/10ML IJ SOLN: 500.0000 mg | Freq: Four times a day (QID) | INTRAVENOUS | Status: DC | PRN

## 2022-04-19 MED ORDER — FENTANYL CITRATE PF 50 MCG/ML IJ SOSY
12.5000 ug | PREFILLED_SYRINGE | INTRAMUSCULAR | Status: DC | PRN
  Administered 2022-04-19: 12.5 ug via INTRAVENOUS
  Filled 2022-04-19: qty 1

## 2022-04-19 MED ORDER — TRANEXAMIC ACID-NACL 1000-0.7 MG/100ML-% IV SOLN
1000.0000 mg | Freq: Once | INTRAVENOUS | Status: DC
  Filled 2022-04-19: qty 100

## 2022-04-19 MED ORDER — PROPOFOL 10 MG/ML IV BOLUS
INTRAVENOUS | Status: DC | PRN
  Administered 2022-04-19: 60 mg via INTRAVENOUS

## 2022-04-19 MED ORDER — CEFAZOLIN SODIUM-DEXTROSE 2-4 GM/100ML-% IV SOLN
2.0000 g | INTRAVENOUS | Status: AC
Start: 2022-04-19 — End: 2022-04-19
  Administered 2022-04-19: 2 g via INTRAVENOUS
  Filled 2022-04-19: qty 100

## 2022-04-19 MED ORDER — SODIUM CHLORIDE 0.9% IV SOLUTION
Freq: Once | INTRAVENOUS | Status: AC
Start: 2022-04-19 — End: 2022-04-19

## 2022-04-19 MED ORDER — PHENYLEPHRINE 80 MCG/ML (10ML) SYRINGE FOR IV PUSH (FOR BLOOD PRESSURE SUPPORT)
PREFILLED_SYRINGE | INTRAVENOUS | Status: AC
  Filled 2022-04-19: qty 40

## 2022-04-19 MED ORDER — SUGAMMADEX SODIUM 200 MG/2ML IV SOLN
INTRAVENOUS | Status: DC | PRN
  Administered 2022-04-19: 150 mg via INTRAVENOUS

## 2022-04-19 MED ORDER — NOREPINEPHRINE 16 MG/250ML-% IV SOLN
0.0000 ug/min | INTRAVENOUS | Status: DC
  Administered 2022-04-19: 2 ug/min via INTRAVENOUS
  Filled 2022-04-19: qty 250

## 2022-04-19 MED ORDER — FENTANYL CITRATE (PF) 100 MCG/2ML IJ SOLN
INTRAMUSCULAR | Status: DC | PRN
  Administered 2022-04-19: 25 ug via INTRAVENOUS

## 2022-04-19 MED ORDER — ONDANSETRON HCL 4 MG/2ML IJ SOLN
INTRAMUSCULAR | Status: DC | PRN
  Administered 2022-04-19: 4 mg via INTRAVENOUS

## 2022-04-19 MED ORDER — METOCLOPRAMIDE HCL 5 MG PO TABS: 5.0000 mg | ORAL_TABLET | Freq: Three times a day (TID) | ORAL | Status: DC | PRN

## 2022-04-19 MED ORDER — LIDOCAINE 2% (20 MG/ML) 5 ML SYRINGE
INTRAMUSCULAR | Status: DC | PRN
  Administered 2022-04-19: 40 mg via INTRAVENOUS

## 2022-04-19 MED ORDER — ONDANSETRON HCL 4 MG/2ML IJ SOLN: 4.0000 mg | Freq: Four times a day (QID) | INTRAMUSCULAR | Status: DC | PRN

## 2022-04-19 MED ORDER — PHENOL 1.4 % MT LIQD: 1.0000 | OROMUCOSAL | Status: DC | PRN

## 2022-04-19 MED ORDER — POLYETHYLENE GLYCOL 3350 17 G PO PACK
17.0000 g | PACK | Freq: Every day | ORAL | Status: DC | PRN
Start: 2022-04-19 — End: 2022-04-28

## 2022-04-19 MED ORDER — POVIDONE-IODINE 10 % EX SWAB
2.0000 | Freq: Once | CUTANEOUS | Status: AC
  Administered 2022-04-19: 2 via TOPICAL

## 2022-04-19 MED ORDER — FENTANYL CITRATE (PF) 100 MCG/2ML IJ SOLN
INTRAMUSCULAR | Status: AC
  Filled 2022-04-19: qty 2

## 2022-04-19 MED ORDER — TRANEXAMIC ACID-NACL 1000-0.7 MG/100ML-% IV SOLN
1000.0000 mg | Freq: Once | INTRAVENOUS | Status: DC
Start: 2022-04-20 — End: 2022-04-20
  Filled 2022-04-19: qty 100

## 2022-04-19 MED ORDER — MENTHOL 3 MG MT LOZG: 1.0000 | LOZENGE | OROMUCOSAL | Status: DC | PRN

## 2022-04-19 MED ORDER — ORAL CARE MOUTH RINSE
15.0000 mL | OROMUCOSAL | Status: DC
  Administered 2022-04-19 – 2022-04-29 (×30): 15 mL via OROMUCOSAL

## 2022-04-19 MED ORDER — SODIUM CHLORIDE 0.9 % IV BOLUS
500.0000 mL | Freq: Once | INTRAVENOUS | Status: AC
  Administered 2022-04-19: 500 mL via INTRAVENOUS

## 2022-04-19 MED ORDER — DOCUSATE SODIUM 100 MG PO CAPS
100.0000 mg | ORAL_CAPSULE | Freq: Two times a day (BID) | ORAL | Status: DC
  Administered 2022-04-20 – 2022-04-23 (×8): 100 mg via ORAL
  Filled 2022-04-19 (×8): qty 1

## 2022-04-19 MED ORDER — HYDROCODONE-ACETAMINOPHEN 5-325 MG PO TABS
1.0000 | ORAL_TABLET | ORAL | Status: DC | PRN
  Administered 2022-04-19 – 2022-04-24 (×8): 1 via ORAL
  Filled 2022-04-19 (×9): qty 1

## 2022-04-19 MED ORDER — DEXAMETHASONE SODIUM PHOSPHATE 10 MG/ML IJ SOLN
INTRAMUSCULAR | Status: AC
  Filled 2022-04-19: qty 1

## 2022-04-19 MED ORDER — TRANEXAMIC ACID-NACL 1000-0.7 MG/100ML-% IV SOLN
1000.0000 mg | INTRAVENOUS | Status: AC
  Administered 2022-04-19: 1000 mg via INTRAVENOUS
  Filled 2022-04-19: qty 100

## 2022-04-19 MED ORDER — DEXAMETHASONE SODIUM PHOSPHATE 10 MG/ML IJ SOLN
INTRAMUSCULAR | Status: DC | PRN
  Administered 2022-04-19: 10 mg via INTRAVENOUS

## 2022-04-19 MED ORDER — CHLORHEXIDINE GLUCONATE 4 % EX LIQD: 60.0000 mL | Freq: Once | CUTANEOUS | Status: DC

## 2022-04-19 MED ORDER — HYDROMORPHONE HCL 1 MG/ML IJ SOLN: 0.2500 mg | INTRAMUSCULAR | Status: DC | PRN

## 2022-04-19 SURGICAL SUPPLY — 48 items
ADH SKN CLS APL DERMABOND .7 (GAUZE/BANDAGES/DRESSINGS) ×1
BAG COUNTER SPONGE SURGICOUNT (BAG) ×1 IMPLANT
BAG SPEC THK2 15X12 ZIP CLS (MISCELLANEOUS) ×1
BAG SPNG CNTER NS LX DISP (BAG) ×1
BAG ZIPLOCK 12X15 (MISCELLANEOUS) ×3 IMPLANT
BIT DRILL CANN LG 4.3MM (BIT) IMPLANT
BNDG CMPR 5X6 CHSV STRCH STRL (GAUZE/BANDAGES/DRESSINGS) ×1
BNDG COHESIVE 6X5 TAN ST LF (GAUZE/BANDAGES/DRESSINGS) ×1 IMPLANT
COVER PERINEAL POST (MISCELLANEOUS) ×3 IMPLANT
COVER SURGICAL LIGHT HANDLE (MISCELLANEOUS) ×3 IMPLANT
DERMABOND ADVANCED (GAUZE/BANDAGES/DRESSINGS) ×1
DERMABOND ADVANCED .7 DNX12 (GAUZE/BANDAGES/DRESSINGS) ×2 IMPLANT
DRILL BIT CANN LG 4.3MM (BIT) ×2
DRSG AQUACEL AG ADV 3.5X 4 (GAUZE/BANDAGES/DRESSINGS) ×6 IMPLANT
DRSG AQUACEL AG ADV 3.5X 6 (GAUZE/BANDAGES/DRESSINGS) ×3 IMPLANT
DURAPREP 26ML APPLICATOR (WOUND CARE) ×3 IMPLANT
ELECT REM PT RETURN 15FT ADLT (MISCELLANEOUS) ×3 IMPLANT
FACESHIELD WRAPAROUND (MASK) ×4 IMPLANT
FACESHIELD WRAPAROUND OR TEAM (MASK) IMPLANT
GLOVE BIO SURGEON STRL SZ 6 (GLOVE) ×3 IMPLANT
GLOVE BIOGEL PI IND STRL 6.5 (GLOVE) ×2 IMPLANT
GLOVE BIOGEL PI IND STRL 7.5 (GLOVE) ×4 IMPLANT
GLOVE BIOGEL PI INDICATOR 6.5 (GLOVE) ×1
GLOVE BIOGEL PI INDICATOR 7.5 (GLOVE) ×2
GLOVE BIOGEL PI ORTHO PRO 7.5 (GLOVE) ×1
GLOVE PI ORTHO PRO STRL 7.5 (GLOVE) ×2 IMPLANT
GOWN STRL REUS W/ TWL LRG LVL3 (GOWN DISPOSABLE) ×4 IMPLANT
GOWN STRL REUS W/TWL LRG LVL3 (GOWN DISPOSABLE) ×4
GUIDEPIN VERSANAIL DSP 3.2X444 (ORTHOPEDIC DISPOSABLE SUPPLIES) ×1 IMPLANT
GUIDEWIRE BALL NOSE 100CM (WIRE) ×1 IMPLANT
KIT BASIN OR (CUSTOM PROCEDURE TRAY) ×3 IMPLANT
KIT TURNOVER KIT A (KITS) IMPLANT
MANIFOLD NEPTUNE II (INSTRUMENTS) ×3 IMPLANT
NAIL HIP FRACT 130D 11X180 (Screw) ×1 IMPLANT
NS IRRIG 1000ML POUR BTL (IV SOLUTION) ×1 IMPLANT
PACK GENERAL/GYN (CUSTOM PROCEDURE TRAY) ×3 IMPLANT
PADDING CAST COTTON 6X4 STRL (CAST SUPPLIES) ×3 IMPLANT
PROTECTOR NERVE ULNAR (MISCELLANEOUS) ×3 IMPLANT
SCREW BONE CORTICAL 5.0X3 (Screw) ×1 IMPLANT
SCREW LAG 10.5MMX105MM HFN (Screw) ×1 IMPLANT
SPONGE T-LAP 18X18 ~~LOC~~+RFID (SPONGE) ×2 IMPLANT
SUT MNCRL AB 4-0 PS2 18 (SUTURE) ×3 IMPLANT
SUT VIC AB 1 CT1 36 (SUTURE) ×3 IMPLANT
SUT VIC AB 2-0 CT1 27 (SUTURE) ×4
SUT VIC AB 2-0 CT1 TAPERPNT 27 (SUTURE) ×4 IMPLANT
TOWEL OR 17X26 10 PK STRL BLUE (TOWEL DISPOSABLE) ×3 IMPLANT
TOWEL OR NON WOVEN STRL DISP B (DISPOSABLE) ×3 IMPLANT
WATER STERILE IRR 1000ML POUR (IV SOLUTION) ×1 IMPLANT

## 2022-04-19 NOTE — Assessment & Plan Note (Addendum)
Hgb range has been 8-12 over the last 6 months, recently more 8-10.  On admission, Hgb <7 g/dL. Transfused 3 units this hospitalization.   Stable since surgery.  No clinical bleeding - Trend Hgb

## 2022-04-19 NOTE — Assessment & Plan Note (Addendum)
Remains hypotensive - Hold metoprolo and furosemide - Levo weaned off this morning

## 2022-04-19 NOTE — Transfer of Care (Signed)
Immediate Anesthesia Transfer of Care Note  Patient: Carrie [name]  Procedure(s) Performed: INTRAMEDULLARY (IM) NAIL INTERTROCHANTRIC (Left)  Patient Location: PACU  Anesthesia Type:General  Level of Consciousness: awake, alert  and oriented  Airway & Oxygen Therapy: Patient Spontanous Breathing and Patient connected to face mask oxygen  Post-op Assessment: Report given to RN and Post -op Vital signs reviewed and stable  Post vital signs: Reviewed and stable  Last Vitals:  Vitals Value Taken Time  BP 135/65 04/20/2022 1822  Temp    Pulse 56 04/17/2022 1825  Resp 21 05/15/2022 1825  SpO2 95 % 05/03/2022 1825  Vitals shown include unvalidated device data.  Last Pain:  Vitals:   05/13/2022 1537  TempSrc: Oral  PainSc:       Patients Stated Pain Goal: 2 (56/81/27 5170)  Complications: No notable events documented.

## 2022-04-19 NOTE — Hospital Course (Addendum)
Mrs. Goris is a 81 y.o. F with hx follicular lymphoma stage IIIa s/p chemotherapy with pancytopenia, dCHF, cAF on Eliquis, COVID last Feb with chronic cough, NOT on home O2, hypothyroidism, HTN, HLD, stage IIIb CKD who presented with confusion and unwitnessed fall with complaint of left hip pain.   In the ER, patient noted to have hypoxia, new hip fracture.  CTA chest showed no PE but confirmed mild multifocal infiltrates.       7/2: Admitted for hip fracture 7/3: Developed pre-op hypotension due to ABLA requiring 2u PRBCs, stabilised and went to OR in afternoon for ORIF 7/4: Hypotensive again overnight, got 3rd unit PRBCs, started on Levophed briefly, CCM consulted 7/5 transferred out of unit, MBS shows upper esophageal sphincter dysfunction 7/6: GI consulted 7/8: EGD shows very high level esophageal dysfunction, no intervention possible, CT neck unremarkable, ENT consulted 7/9: ENT performed rigid esophagoscopy and dilation and botox injection fo UES succesfully 7/10: Overnight hypotensive, transferred to ICU and BP improved on albumin, CCM consulted 7/11: Cr doubled, LFTs markedly up, mentation worse

## 2022-04-19 NOTE — Progress Notes (Signed)
PT Cancellation Note  Patient Details Name: NUVIA HILEMAN MRN: [id number] DOB: [birth year]   Cancelled Treatment:    Reason Eval/Treat Not Completed: Medical issues which prohibited therapy,  to have surgery at some point. Will await post op orders. [place] Office [phone] Weekend pager-[phone]    Claretha Cooper 04/22/2022, 7:27 AM

## 2022-04-19 NOTE — Op Note (Signed)
NAMEERNESTA, TRABERT MEDICAL RECORD NO: [id number] ACCOUNT NO: [id number] DATE OF BIRTH: 03-28-1941 FACILITY: [facility] LOCATION: [facility] PHYSICIAN: Pietro Cassis. Alvan Dame, MD  Operative Report   DATE OF PROCEDURE: 05/10/2022  PREOPERATIVE DIAGNOSIS:  Comminuted left intertrochanteric femur fracture.  POSTOPERATIVE DIAGNOSIS:  Comminuted left intertrochanteric femur fracture.  PROCEDURE:  Open reduction and internal fixation of left intertrochanteric femur fracture utilizing the Biomet Affixus nail, 11 x 180 mm with a 130-degree lag screw with the distal interlock.  SURGEON:  Pietro Cassis. Alvan Dame, MD  ASSISTANT:  Costella Hatcher, PA-C.  Note that Ms. Lu Duffel was present for the entirety of the case from preoperative positioning, perioperative management of the operative extremity, general facilitation of the case and primary wound closure.  ANESTHESIA:  General.  BLOOD LOSS:  About 200 mL.  COMPLICATIONS:  None apparent.  INDICATIONS FOR PROCEDURE:  The patient is a 81 year old female with multiple medical comorbidities.  She was at her place of residence and current rehab when she had a ground level fall.  She has had immediate onset of pain and was unable to bear  weight.  She was brought to the emergency room where radiographs revealed a comminuted left intertrochanteric femur fracture.  Orthopedics was consulted for management.  I reviewed with the patient and her daughter the risks of malunion, nonunion, need  for future surgeries.  We discussed and reviewed the postoperative course and expectation including prolonged challenges with regards to pain and gait.  Consent was obtained for benefit of pain relief.  DESCRIPTION OF PROCEDURE:  The patient was brought to the operative theater.  Once adequate anesthesia, preoperative antibiotics administered including Ancef as well as in hospital based initiated antibiotics.  She was positioned supine on the fracture  table.  Her unaffected right lower  extremity was flexed and abducted out of the way with bony prominences padded, particularly over the lateral aspect of the knee to protect the peroneal nerve.  She was positioned against the perineal post.  We brought  fluoroscopy into the field to identify the nature of the fracture.  With traction and internal rotation on the AP view, I was able to restore the length of her fracture at the intertrochanteric femoral neck junction.  However, on the lateral view, there  was significant posterior displacement of the femoral shaft.  At this point, the lateral left hip was prepped and draped in sterile fashion.  Timeout was performed identifying the patient, planned procedure, and extremity.  Fluoroscopy was brought back  to the field.  The tip of the trochanter was identified.  An incision was made lateral to the proximal femur.  An incision was carried through the gluteal fascia.  I then inserted a guidewire into the comminuted tip of the trochanter and then passed it  into the shaft of the femur across the fracture site.  I did confirm this in the AP and lateral planes.  Once this was done, we selected the 11 x 180 mm nail.  As I open up the proximal femur with a drill.  We then tried to pass the nail, but was unable  to pass it through the metadiaphyseal region due to the tight isthmus.  I removed the nail and placed a ball-tipped guidewire.  I then reamed up to a 12 mm reamer, at which point we then passed the nail by hand to its appropriate depth.  With the nail in  position I then used a Cobb elevator and applied a posterior angled force along  the neck of the femoral shaft to reduce the intertrochanteric segment.  With this in place, I was able to pass a guidewire to the center of the femoral head in AP and  lateral planes slightly inferior in the AP view.  I measured and selected a 105 mm lag screw.  We drilled and then passed the screw to the very subchondral bone to get the best approach as possible.  I  then took gross traction off and medialize the shaft  to the fractured segment.  I then tightened the proximal locking bolt and then backed off a quarter to allow for some further compression and prevent complications from the lag screw.  I then placed a distal interlock measuring 34 mm.  The jig was  removed.  Final radiographs were obtained in AP and lateral planes.  All wounds were irrigated.  The proximal wound was closed with #1 Vicryl in the gluteal fascia, followed by 2-0 Vicryl in the skin and a running Monocryl.  The distal 2 stab incisions  were closed with Vicryl.  All wounds were clean, dry and dressed sterilely using surgical glue and Aquacel dressing.  She was then brought to the recovery room in stable condition, tolerated the procedure well.  Postoperatively, she will need to be  limited weightbearing, no more than 50% weightbearing left lower extremity to allow for bone to heal.  Her bone was very fragile now by definition now, osteoporotic.  We will follow up while she is in the hospital and then at discharge in the office in 2  weeks for radiographs.   PUS D: 04/22/2022 6:01:34 pm T: 04/23/2022 8:13:00 pm  JOB: [id number]/ [id number]

## 2022-04-19 NOTE — Assessment & Plan Note (Addendum)
Presented with tachycardia, leukopenia, encephalopathy, worsening respiratory failure in the setting of worsening pulmonary infiltrates. Shock was not present.    Suspected to be urinary source, urine culture growing citrobacter susc to CTX.  RVP positive for rhinovirus.  Completed 5 days antibiotics. Resolved.  Restarting abx now due to transaminitis, until cholangitis can be ruled out

## 2022-04-19 NOTE — Assessment & Plan Note (Addendum)
S/p ORIF 7/3 by Dr. Alvan Dame Some concern for internal rotation of hip yesterday, but x-rays show hardware and fracture in good position - Consult Orthopedics, appreciate recommendations - 50% partial weight bearing to left - DVT ppx: Resume Eliquis when cleared by GI, SCDs for now

## 2022-04-19 NOTE — Progress Notes (Signed)
Primary nurse called back to rapid response nurse Erin to update her on patient. Patient is showing decrease in BP. Rapid response is deciding to transfer patient to the ICU/Step down. Will continue to monitor.

## 2022-04-19 NOTE — Assessment & Plan Note (Signed)
-   Supplemented and resolved 

## 2022-04-19 NOTE — Assessment & Plan Note (Addendum)
Due to hypovolemia, stable, clinically insignificant - Hold furosemide

## 2022-04-19 NOTE — Anesthesia Postprocedure Evaluation (Signed)
Anesthesia Post Note  Patient: JOELL BUERGER  Procedure(s) Performed: INTRAMEDULLARY (IM) NAIL INTERTROCHANTRIC (Left)     Patient location during evaluation: PACU Anesthesia Type: General Level of consciousness: awake and alert, oriented and patient cooperative Pain management: pain level controlled Vital Signs Assessment: post-procedure vital signs reviewed and stable Respiratory status: spontaneous breathing, nonlabored ventilation and respiratory function stable Cardiovascular status: blood pressure returned to baseline and stable Postop Assessment: no apparent nausea or vomiting Anesthetic complications: no   No notable events documented.  Last Vitals:  Vitals:   04/28/2022 1537 04/17/2022 1825  BP: 129/69 135/65  Pulse: (!) 57 (!) 56  Resp: 16 (!) 21  Temp: 36.4 C 36.4 C  SpO2: 96% 95%    Last Pain:  Vitals:   05/04/2022 1825  TempSrc:   PainSc: 0-No pain                 Pervis Hocking

## 2022-04-19 NOTE — Assessment & Plan Note (Addendum)
Not on O2 at baseline. Since May, has been on 2L O2 at [facility], unable to wean off there.   Here, she had new infiltrates, has had hypoxia to the mid-80s, respiratory rates in the 30s and requires 4L O2 to maintain O2 sats up from her recent baseline 3L.  Now resolved to recent baseline.

## 2022-04-19 NOTE — Progress Notes (Signed)
[place] provider came to round on patient. Provider unaware that patient was transfer to ICU/Step down. Nurse Nikki Dom updated provider on events from patient last night.

## 2022-04-19 NOTE — Assessment & Plan Note (Addendum)
Completed course of antibiotics.

## 2022-04-19 NOTE — Assessment & Plan Note (Addendum)
Patient had been hypokalemic which resolved with supplementation, but now is hyperkalemic with potassium of 6. - Order for [facility] were placed.

## 2022-04-19 NOTE — Assessment & Plan Note (Signed)
Due to Afib - Hold Eliquis

## 2022-04-19 NOTE — Assessment & Plan Note (Addendum)
TSH 2 months ago normal, now slightly high - Continue levothyroxine

## 2022-04-19 NOTE — Interval H&P Note (Signed)
History and Physical Interval Note:  05/05/2022 4:18 PM  Carrie [name]  has presented today for surgery, with the diagnosis of Left Intertrochanteric fracture.  The various methods of treatment have been discussed with the patient and family. After consideration of risks, benefits and other options for treatment, the patient has consented to  Procedure(s) with comments: INTRAMEDULLARY (IM) NAIL INTERTROCHANTRIC (Left) - Biomet short nail, Hana as a surgical intervention.  The patient's history has been reviewed, patient examined, no change in status, stable for surgery.  I have reviewed the patient's chart and labs.  Questions were answered to the patient's satisfaction.     Mauri Pole

## 2022-04-19 NOTE — Assessment & Plan Note (Signed)
In remission on rituximab maintenance, last dose Jan 2023

## 2022-04-19 NOTE — Assessment & Plan Note (Addendum)
Tachycardic again - Hold Eliquis - Continue amiodarone and metroprolol

## 2022-04-19 NOTE — Progress Notes (Signed)
OT Cancellation Note  Patient Details Name: Carrie [name] MRN: [id number] DOB: 13-Jun-1941   Cancelled Treatment:    Reason Eval/Treat Not Completed: Medical issues which prohibited therapy Patient pending surgery. OT to check back post operatively for updates to orders.  Jackelyn Poling OTR/L, [place] [facility] Office# [phone] Pager# [phone]  04/21/2022, 7:30 AM

## 2022-04-19 NOTE — Progress Notes (Signed)
Patient was transport to ICU/Step down by primary nurse and charge nurse. Patient was transported with 4 liters of oxgyen, and  transport monitor. Airway and lines patent. Patient only had her cellphone for belonings at the bedside.

## 2022-04-19 NOTE — Assessment & Plan Note (Addendum)
Chronic and intermittent, stable, in setting of follicular lymphoma

## 2022-04-19 NOTE — Anesthesia Preprocedure Evaluation (Addendum)
Anesthesia Evaluation  Patient identified by MRN, date of birth, ID band Patient awake    Reviewed: Allergy & Precautions, NPO status , Patient's Chart, lab work & pertinent test results, Unable to perform ROS - Chart review only  History of Anesthesia Complications Negative for: history of anesthetic complications  Airway Mallampati: II  TM Distance: >3 FB Neck ROM: Full    Dental no notable dental hx.    Pulmonary shortness of breath and Long-Term Oxygen Therapy, pneumonia, unresolved,  Increasing infiltrates on CXR- PNA vs RSV  On home oxygen 3LPM- currently on 4LPM to maintain sats, RR in 30s     Pulmonary exam normal        Cardiovascular hypertension, Pt. on medications + CAD and +CHF  Normal cardiovascular exam+ dysrhythmias (eliquis- last dose: 6/30? pt family uncertain) Atrial Fibrillation + Valvular Problems/Murmurs (mild MR, mild AI) MR and AI   TTE 12/2021:  1. Inferior basal hypokinesis . Left ventricular ejection fraction, by estimation, is 50 to 55%. The left ventricle has low normal function. The left ventricle demonstrates regional wall motion abnormalities (see scoring diagram/findings for description). Left ventricular diastolic parameters were normal.  2. No morphologic signs of pulmonary HTN Estimated PA systolic pressure only mildly elevated 38 mmHg . Right ventricular systolic function is normal. The right ventricular size is normal. There is normal pulmonary  artery systolic pressure.  3. Left atrial size was mildly dilated.  4. The mitral valve is abnormal. Mild mitral valve regurgitation. No evidence of mitral stenosis.  5. Tricuspid valve regurgitation is mild to moderate.  6. The aortic valve is normal in structure. There is moderate calcification of the aortic valve. Aortic valve regurgitation is mild. Aortic valve sclerosis/calcification is present, without any evidence of aortic stenosis.  7. The  inferior vena cava is normal in size with greater than 50% respiratory variability, suggesting right atrial pressure of 3 mmHg.   Transferred to ICU last night for hypotension, improved after blood transfusion    Neuro/Psych  Headaches, PSYCHIATRIC DISORDERS Anxiety Dementia Increasingly less oriented over last weeks Neuromuscular disease    GI/Hepatic negative GI ROS, Neg liver ROS,   Endo/Other  diabetesHypothyroidism   Renal/GU Renal InsufficiencyRenal diseaseCr 1.34  negative genitourinary   Musculoskeletal  (+) Arthritis ,   Abdominal   Peds  Hematology  (+) Blood dyscrasia, anemia , Hb 6.7 this morning- received 2 units prbcs, post hb: 9.3  Known follicular lymphoma stage IIIa s/p chemotherapy with pancytopenia- Hb usually runs 9-10   Anesthesia Other Findings Follicular lymphoma  Reproductive/Obstetrics negative OB ROS                          Anesthesia Physical  Anesthesia Plan  ASA: 4  Anesthesia Plan: General   Post-op Pain Management: Ofirmev IV (intra-op)*   Induction: Intravenous  PONV Risk Score and Plan: 4 or greater and Treatment may vary due to age or medical condition, Ondansetron and Dexamethasone  Airway Management Planned: Oral ETT  Additional Equipment:   Intra-op Plan:   Post-operative Plan: Possible Post-op intubation/ventilation and Extubation in OR  Informed Consent: I have reviewed the patients History and Physical, chart, labs and discussed the procedure including the risks, benefits and alternatives for the proposed anesthesia with the patient or authorized representative who has indicated his/her understanding and acceptance.   Patient has DNR.  Discussed DNR with patient and Suspend DNR.   Dental advisory given  Plan Discussed with: CRNA  Anesthesia Plan Comments: (D/w pt and daughter [name] (POA) general anesthesia- both would like to suspend DNR while in the operating room. )      Anesthesia Quick  Evaluation

## 2022-04-19 NOTE — H&P (View-Only) (Signed)
ORTHOPAEDIC CONSULTATION  REQUESTING PHYSICIAN: Edwin Dada, *  PCP:  [name]  Chief Complaint: Fall  HPI: Carrie [name] is a 81 y.o. female who presented to [facility] ED after unwitnessed fall on 05/10/2022. She had complaints of left hip pain. Imaging in the ED revealed comminuted and angulated left intertrochanteric femur fracture. Orthopaedics was consulted for evaluation and management.   She has a history of a fib, and typically takes Eliquis. This has been held.     Past Medical History:  Diagnosis Date   A-fib [facility])    Anemia    pt denies    Atrial fibrillation with rapid ventricular response ([place]) 06/25/2015   Carpal tunnel syndrome, bilateral    pt denies    Cervical spondylosis without myelopathy 10/25/2013   Dental bridge present    DJD (degenerative joint disease)    Dyspnea    sometimes hx of cancer non hodgkins lymphoma    Dysrhythmia    WENT INTO A FIB IN 2017    Elevated troponin 02/22/2619   Follicular lymphoma grade 3a ([place]) 02/03/2015   hx of nonhodgkins lymphoma x 3    Headache    sometimes a headache    History of echocardiogram    Echo 12/16: EF 60-65%, no RWMA, severe LAE   History of nuclear stress test    Myoview 1/17: EF 55%, Normal study. No ischemia or scar.   Hypertension    Hypothyroid    Nausea without vomiting 06/20/2015   Stage III chronic kidney disease ([place]) 07/01/2015   Thrush of mouth and esophagus ([place]) 06/25/2015   Past Surgical History:  Procedure Laterality Date   ABDOMINAL HYSTERECTOMY     APPENDECTOMY     BACK SURGERY     X5-lumbar-fusion   CARDIOVERSION N/A 02/17/2022   Procedure: CARDIOVERSION;  Surgeon: Berniece Salines, DO;  Location: [facility];  Service: Cardiovascular;  Laterality: N/A;   COLONOSCOPY     DILATION AND CURETTAGE OF UTERUS     LYMPH NODE BIOPSY Right 01/21/2015   Procedure: RIGHT GROIN LYMPH NODE BIOPSY;  Surgeon: Erroll Luna, MD;  Location: [place];  Service: General;   Laterality: Right;   MASS EXCISION Left 08/29/2018   Procedure: EXCISION LEFT BACK  MASS;  Surgeon: Erroll Luna, MD;  Location: [place];  Service: General;  Laterality: Left;   Ovarian cyst resection     patelar tendon transplants     Left/right   PORTACATH PLACEMENT Right 02/13/2015   Procedure: INSERTION PORT-A-CATH WITH ULTRASOUND;  Surgeon: Erroll Luna, MD;  Location: [place];  Service: General;  Laterality: Right;   PORTACATH PLACEMENT N/A 08/29/2018   Procedure: INSERTION PORT-A-CATH WITH ULTRA SOUND ERAS PATHWAY;  Surgeon: Erroll Luna, MD;  Location: [place];  Service: General;  Laterality: N/A;   REVERSE SHOULDER ARTHROPLASTY Left 03/02/2021   Procedure: REVERSE SHOULDER ARTHROPLASTY;  Surgeon: Netta Cedars, MD;  Location: [facility];  Service: Orthopedics;  Laterality: Left;   RIGHT/LEFT HEART CATH AND CORONARY ANGIOGRAPHY N/A 01/15/2022   Procedure: RIGHT/LEFT HEART CATH AND CORONARY ANGIOGRAPHY;  Surgeon: [name], MD;  Location: [place] [facility];  Service: Cardiovascular;  Laterality: N/A;   TOTAL KNEE ARTHROPLASTY  2011   Right   TUBAL LIGATION     Social History   Socioeconomic History   Marital status: Widowed    Spouse name: Not on file   Number of children: 2   Years of education: hs   Highest education  level: Not on file  Occupational History   Occupation: Retired  Tobacco Use   Smoking status: Never   Smokeless tobacco: Never  Vaping Use   Vaping Use: Never used  Substance and Sexual Activity   Alcohol use: Not Currently   Drug use: No   Sexual activity: Not on file  Other Topics Concern   Not on file  Social History Narrative   Lives alone.  Has a walker for home use.   Social Determinants of Health   Financial Resource Strain: Not on file  Food Insecurity: Not on file  Transportation Needs: Not on file  Physical Activity: Not on file  Stress: Not on file  Social Connections: Not on file   Family History  Problem Relation Age of Onset    Cancer Mother        Breast, lung NHL   Cancer Sister        Multiple myeloma   Allergies  Allergen Reactions   Amiodarone Other (See Comments)    "Hyperthyroidism"....BUT, the patient is taking this (2023); Listed as an "allergy" on her paperwork from [facility]   Prior to Admission medications   Medication Sig Start Date End Date Taking? Authorizing Provider  acetaminophen (TYLENOL) 500 MG tablet Take 500 mg by mouth every 6 (six) hours as needed for mild pain or headache.   Yes [provider]  amiodarone (PACERONE) 200 MG tablet Take 1 tab (200 mg total) twice daily for 1 week and then reduce to 1 tablet (200 mg total) once daily. Patient taking differently: Take 200 mg by mouth daily. 02/18/22  Yes Hongalgi, Lenis Dickinson, MD  apixaban (ELIQUIS) 5 MG TABS tablet Take 5 mg by mouth 2 (two) times daily.    Yes [provider]  Artificial Saliva (BIOTENE DRY MOUTH) LOZG Use as directed 1 lozenge in the mouth or throat every 4 (four) hours as needed (for dry mouth).   Yes [provider]  furosemide (LASIX) 40 MG tablet Take 1 tablet (40 mg total) by mouth daily. 03/04/22  Yes Little Ishikawa, MD  ipratropium-albuterol (DUONEB) 0.5-2.5 (3) MG/3ML SOLN Take 3 mLs by nebulization every 6 (six) hours as needed (for wheezing or shortness of breath).   Yes [provider]  levothyroxine (SYNTHROID) 88 MCG tablet Take 1 tablet (88 mcg total) by mouth daily before breakfast. 01/24/22  Yes Lavina Hamman, MD  Lidocaine-Prilocaine, Bulk, 2-2 % CREA Apply 1 application  topically daily as needed (:"for skin condition").   Yes [provider]  metoprolol tartrate (LOPRESSOR) 50 MG tablet Take 1 tablet (50 mg total) by mouth 2 (two) times daily. 03/03/22  Yes Little Ishikawa, MD  OXYGEN Inhale 2 L/min into the lungs as needed (for shortness of breath).   Yes [provider]  polyvinyl alcohol (LIQUIFILM TEARS) 1.4 % ophthalmic solution Place 1 drop  into both eyes every 8 (eight) hours as needed for dry eyes.   Yes [provider]  senna (SENOKOT) 8.6 MG TABS tablet Take 2 tablets by mouth daily as needed for mild constipation.   Yes [provider]  traZODone (DESYREL) 50 MG tablet Take 1 tablet (50 mg total) by mouth at bedtime. Patient taking differently: Take 50 mg by mouth every evening. 03/03/22  Yes Little Ishikawa, MD  lidocaine-prilocaine (EMLA) cream Apply 1 application topically daily as needed. Patient not taking: Reported on 04/23/2022 04/07/21   [name], MD   CT Angio Chest Pulmonary Embolism (PE)  W or WO Contrast  Result Date: 04/23/2022 CLINICAL DATA:  Recent hip fracture. High probability for pulmonary embolism. EXAM: CT ANGIOGRAPHY CHEST WITH CONTRAST TECHNIQUE: Multidetector CT imaging of the chest was performed using the standard protocol during bolus administration of intravenous contrast. Multiplanar CT image reconstructions and MIPs were obtained to evaluate the vascular anatomy. RADIATION DOSE REDUCTION: This exam was performed according to the departmental dose-optimization program which includes automated exposure control, adjustment of the mA and/or kV according to patient size and/or use of iterative reconstruction technique. CONTRAST:  63m OMNIPAQUE IOHEXOL 350 MG/ML SOLN COMPARISON:  12/30/2021 FINDINGS: Cardiovascular: Satisfactory opacification of pulmonary arteries noted, although evaluation is somewhat limited by respiratory motion artifact. No pulmonary emboli identified. Aorta is not well opacified by contrast however no evidence of thoracic aortic aneurysm. Mediastinum/Nodes: No masses or pathologically enlarged lymph nodes identified. Lungs/Pleura: Infiltrate or atelectasis is seen posterior lower lobes bilaterally. Multifocal ill-defined areas of airspace opacity are also seen in the upper lung zones bilaterally no evidence of pleural effusion. Upper abdomen: No acute findings.  Musculoskeletal: No suspicious bone lesions identified. Review of the MIP images confirms the above findings. IMPRESSION: No evidence of pulmonary embolism. Infiltrate or atelectasis in the posterior lower lobes, and multifocal ill-defined airspace opacities in upper lung zones bilaterally. Differential diagnosis includes atypical infection, inflammatory etiologies, and pulmonary edema. Electronically Signed   By: JMarlaine HindM.D.   On: 05/17/2022 18:09   DG Chest Portable 1 View  Result Date: 04/28/2022 CLINICAL DATA:  Status post fall.  Left hip fracture. EXAM: PORTABLE CHEST 1 VIEW COMPARISON:  [place] 03/01/2022 and 02/26/2022.  CT 12/30/2021. FINDINGS: 0837 hours. Right IJ Port-A-Cath extends to the mid SVC level, stable. The heart size and mediastinal contours are stable. The interstitial opacities seen previously have largely cleared. There may be mild residual underlying fibrosis. No confluent airspace opacity, edema, pleural effusion or pneumothorax. No acute fractures are identified in the chest. There is a left shoulder reverse arthroplasty. Telemetry leads overlie the chest. IMPRESSION: No evidence of acute chest injury or definite active cardiopulmonary process. Interstitial prominence noted previously has improved. Electronically Signed   By: WRichardean SaleM.D.   On: 05/13/2022 09:29   DG Femur Min 2 Views Left  Result Date:  CLINICAL DATA:  Fall this morning. Shortening and rotation of the left leg. EXAM: PELVIS - 1-2 VIEW; LEFT FEMUR 2 VIEWS COMPARISON:  Pelvic radiographs 12/12/2007. FINDINGS: Acute intertrochanteric left femur fracture with associated comminution and varus angulation. No dislocation with significant left hip arthropathy. There are tricompartmental degenerative changes at the left knee. Mild degenerative changes are present at the right hip, and there are degenerative and postsurgical changes in the lower lumbar spine. The sacroiliac joints and symphysis pubis  appear intact. Soft tissue swelling around the proximal left hip fracture. IMPRESSION: 1. Comminuted and angulated intertrochanteric left femur fracture as described. 2. No evidence of acute pelvic fracture. Degenerative and postsurgical changes as described. Electronically Signed   By: WRichardean SaleM.D.   On: 05/01/2022 09:27   DG Pelvis 1-2 Views  Result Date: 05/13/2022 CLINICAL DATA:  Fall this morning. Shortening and rotation of the left leg. EXAM: PELVIS - 1-2 VIEW; LEFT FEMUR 2 VIEWS COMPARISON:  Pelvic radiographs 12/12/2007. FINDINGS: Acute intertrochanteric left femur fracture with associated comminution and varus angulation. No dislocation with significant left hip arthropathy. There are tricompartmental degenerative changes at the left knee. Mild degenerative changes are present at the right hip, and there are degenerative and  postsurgical changes in the lower lumbar spine. The sacroiliac joints and symphysis pubis appear intact. Soft tissue swelling around the proximal left hip fracture. IMPRESSION: 1. Comminuted and angulated intertrochanteric left femur fracture as described. 2. No evidence of acute pelvic fracture. Degenerative and postsurgical changes as described. Electronically Signed   By: Richardean Sale M.D.   On: 05/10/2022 09:27   CT HEAD WO CONTRAST (5MM)  Result Date: 05/05/2022 CLINICAL DATA:  Neck and head trauma moderate to severe, fall. History atrial fibrillation, stage III chronic kidney disease, hypertension, past history lymphoma EXAM: CT HEAD WITHOUT CONTRAST CT CERVICAL SPINE WITHOUT CONTRAST TECHNIQUE: Multidetector CT imaging of the head and cervical spine was performed following the standard protocol without intravenous contrast. Multiplanar CT image reconstructions of the cervical spine were also generated. RADIATION DOSE REDUCTION: This exam was performed according to the departmental dose-optimization program which includes automated exposure control, adjustment of  the mA and/or kV according to patient size and/or use of iterative reconstruction technique. COMPARISON:  02/02/2022 FINDINGS: CT HEAD FINDINGS Brain: Mild motion artifacts degrade exam. Generalized atrophy. Normal ventricular morphology. No midline shift or mass effect. Small vessel chronic ischemic changes of deep cerebral white matter. No intracranial hemorrhage, mass lesion, evidence of acute infarction, or extra-axial fluid collection. Vascular: No hyperdense vessels. Atherosclerotic calcification of internal carotid arteries at skull base Skull: Skull base poorly visualized due to motion artifacts, but adequately visualized on cervical spine CT. Calvaria intact. Sinuses/Orbits: Mucosal thickening LEFT maxillary sinus. Small amount of fluid and mucus within sphenoid sinus. Other: N/A CT CERVICAL SPINE FINDINGS Alignment: Anterolisthesis C3-C4. Retrolisthesis C5-C6. Remaining alignments normal. Skull base and vertebrae: Osseous demineralization. Skull base intact. Vertebral body heights maintained. Multilevel disc space narrowing and endplate spur formation C3-C4 through C6-C7. Scattered facet degenerative changes. No fracture, additional subluxation, or bone destruction. Incomplete posterior arch of C1 noted, developmental anomaly. Soft tissues and spinal canal: Prevertebral soft tissues normal thickness. Disc levels:  No specific abnormalities Upper chest: Lung apices clear Other: RIGHT jugular line. Atherosclerotic calcification aortic arch. IMPRESSION: Atrophy with small vessel chronic ischemic changes of deep cerebral white matter. No acute intracranial abnormalities. Multilevel degenerative disc and facet disease changes of the cervical spine. No acute cervical spine abnormalities. Aortic Atherosclerosis (ICD10-I70.0). Electronically Signed   By: Lavonia Dana M.D.   On: 05/10/2022 09:08   CT Cervical Spine Wo Contrast  Result Date: 04/23/2022 CLINICAL DATA:  Neck and head trauma moderate to severe, fall.  History atrial fibrillation, stage III chronic kidney disease, hypertension, past history lymphoma EXAM: CT HEAD WITHOUT CONTRAST CT CERVICAL SPINE WITHOUT CONTRAST TECHNIQUE: Multidetector CT imaging of the head and cervical spine was performed following the standard protocol without intravenous contrast. Multiplanar CT image reconstructions of the cervical spine were also generated. RADIATION DOSE REDUCTION: This exam was performed according to the departmental dose-optimization program which includes automated exposure control, adjustment of the mA and/or kV according to patient size and/or use of iterative reconstruction technique. COMPARISON:  02/02/2022 FINDINGS: CT HEAD FINDINGS Brain: Mild motion artifacts degrade exam. Generalized atrophy. Normal ventricular morphology. No midline shift or mass effect. Small vessel chronic ischemic changes of deep cerebral white matter. No intracranial hemorrhage, mass lesion, evidence of acute infarction, or extra-axial fluid collection. Vascular: No hyperdense vessels. Atherosclerotic calcification of internal carotid arteries at skull base Skull: Skull base poorly visualized due to motion artifacts, but adequately visualized on cervical spine CT. Calvaria intact. Sinuses/Orbits: Mucosal thickening LEFT maxillary sinus. Small amount of fluid and mucus within  sphenoid sinus. Other: N/A CT CERVICAL SPINE FINDINGS Alignment: Anterolisthesis C3-C4. Retrolisthesis C5-C6. Remaining alignments normal. Skull base and vertebrae: Osseous demineralization. Skull base intact. Vertebral body heights maintained. Multilevel disc space narrowing and endplate spur formation C3-C4 through C6-C7. Scattered facet degenerative changes. No fracture, additional subluxation, or bone destruction. Incomplete posterior arch of C1 noted, developmental anomaly. Soft tissues and spinal canal: Prevertebral soft tissues normal thickness. Disc levels:  No specific abnormalities Upper chest: Lung apices  clear Other: RIGHT jugular line. Atherosclerotic calcification aortic arch. IMPRESSION: Atrophy with small vessel chronic ischemic changes of deep cerebral white matter. No acute intracranial abnormalities. Multilevel degenerative disc and facet disease changes of the cervical spine. No acute cervical spine abnormalities. Aortic Atherosclerosis (ICD10-I70.0). Electronically Signed   By: Lavonia Dana M.D.   On: 04/17/2022 09:08    Positive ROS: All other systems have been reviewed and were otherwise negative with the exception of those mentioned in the HPI and as above.  Physical Exam: General: Alert, no acute distress Skin: No lesions in the area of chief complaint   MUSCULOSKELETAL:  Left Hip: ROM not assessed due to known fracture. Skin intact. Sensation and pulses intact distally.    Assessment: Comminuted and angulated left intertrochanteric femur fracture  Plan: Patient has been transferred to ICU/Step down. Awaiting medical clearance. Multiple medical comorbidities including sepsis in setting of possible PNA/UTI as well as anemia with hemodynamic instability. Hgb this morning is 6.7, receiving transfusion.  Ultimate plan is for left femur intramedullary nailing. Tentatively scheduled for this afternoon. Timing dependent on medical clearance.     Irving Copas, PA-C Cell [phone]   04/30/2022 7:33 AM

## 2022-04-19 NOTE — Progress Notes (Signed)
Initial Nutrition Assessment  DOCUMENTATION CODES:   Not applicable  INTERVENTION:  Monitor for diet advancement Once diet resumes, recommend Ensure Enlive po BID, each supplement provides 350 kcal and 20 grams of protein.  NUTRITION DIAGNOSIS:   Increased nutrient needs related to post-op healing, hip fracture as evidenced by estimated needs.  GOAL:   Patient will meet greater than or equal to 90% of their needs  MONITOR:   Diet advancement, Labs, Skin, Weight trends  REASON FOR ASSESSMENT:   Consult Assessment of nutrition requirement/status  ASSESSMENT:   Pt admitted from [facility] after a fall leading to L hip fracture. PMH significant for PAF s/p DCCV 5/3 with ERAF, CHF, follicular lymphoma stage IIIa s/p chemotherapy with pancytopenia, hypothyroidism, HTN, HLD, CKD stage IIIb and chronic cough since recovering from COVID-19 early 2023 on chronic O2.  Pt was planned for hip repair this morning however is now postponed d/t pt being transferred down to [facility] unit given hypotension, tachypnea and acute blood loss anemia.  Briefly spoke with Colvin Caroli, pt's daughter, via phone call to room however unable to obtain detailed nutrition/wt history as MD present at time of phone call. Unsuccessful attempt to reach her via follow up call.   She states that her mom was doing much better today and was awaiting surgery. Pt has been NPO for procedure today. She would likely benefit from addition of nutrition supplements once diet resumes to aid in post-op healing. Will monitor for diet advancement and add supplements as appropriate.  Reviewed wt history. Her wt appears to have fluctuations between 61.2-65.1 kg. Wt changes likely d/t pt's h/o of CHF and recurrent admissions for afib. Current admit wt is consistent with last d/c wt of 59 kg, uncertain if this actual or stated wt. Will continue to monitor throughout admission.   Edema: non-pitting BLE  Medications: synthroid, IV  abx  Labs: sodium 131, Cr 1.34, corrected calcium 8.48, GFR 40, Vitamin D 19.23    NUTRITION - FOCUSED PHYSICAL EXAM: RD working remotely. Deferred to follow up.   Diet Order:   Diet Order             Diet NPO time specified  Diet effective ____           Diet NPO time specified Except for: Sips with Meds  Diet effective midnight           Diet NPO time specified  Diet effective midnight                   EDUCATION NEEDS:   No education needs have been identified at this time  Skin:  Skin Assessment: Reviewed RN Assessment  Last BM:  7/3 (type 7)  Height:   Ht Readings from Last 1 Encounters:  05/08/2022 '5\' 1"'$  (1.549 m)    Weight:   Wt Readings from Last 1 Encounters:  05/15/2022 59 kg   BMI:  Body mass index is 24.58 kg/m.  Estimated Nutritional Needs:   Kcal:  1500-1700  Protein:  75-90g  Fluid:  >/=1.5L  Clayborne Dana, RDN, LDN Clinical Nutrition

## 2022-04-19 NOTE — Progress Notes (Addendum)
BRIEF OVERNIGHT CROSS COVER REPORT    BRIEF HPI: 81 y.o. female with medical history significant of PAF s/p DCCV 5/3 with ERAF, chronic HFpEF, Follicular lymphoma stage IIIa s/p chemotherapy with pancytopenia, hypothyroidism, HTN, HLD, stage IIIb CKD, and chronic cough since recovering from covid-19 infection early 2023 on chronic O2 of 3L at [facility] admitted with left femure fx s/p mechanical fall and sepsis  2/2 UTI and probable pneumonia  SUBJECTIVE: Notified by RN of persistent hypotension despite IVFs.  OBJECTIVE: She  is afebrile with blood pressure 87/72m Hg and pulse rate 56 beats/min, RR 21 with sats 96% on RA. There were no focal neurological deficits; she was alert and oriented x2.  Complete blood count (CBC) this am revealed hgb of 6.7 drop from 9.0   Symptomatic Anemia of unknown source with evidence hemodynamic instability Hemorrhagic shock hgb of 6.7 drop from 9.0 - At least 2x IV access, 18 gauge or larger - IVF resuscitation to maintain MAP>65 - Pressor to keep MAP goal>65 - H&H monitoring q6h - Blood Consent.  Transfuse PRN Hgb<7 - NPO  - Hold anticoagulation - PCCM consult if requiring pressor, high risk for decompensation   Left femur fracture s/p fall  -plan for OR in am ? -npo midnight  -place on hip protocol  -hold anticoagulation  -supportive pain medication  -Dr ONoralee Charsconsulted from Orthopedics  Sepsis in the setting of suspected  Pneumonia and UTI Meets SIRS Criteria  -Monitor fever curve -Trend WBC's & Procalcitonin -Follow cultures as above -Continue empiric abx pending cultures & sensitivities   AKI likely ATN in the setting of above Hypokalemia`~RESOLVED Hyponatremia -Monitor I&O's / urinary output -Follow BMP -Ensure adequate renal perfusion -Avoid nephrotoxic agents as able -Replace electrolytes as indicated    ERufina Falco DNP, CCRN, FNP-C, AGACNP-BC Acute Care & Family Nurse Practitioner  [facility]  See Amion  for personal pager PCCM on call pager [phone] 7 am

## 2022-04-19 NOTE — Consult Note (Signed)
ORTHOPAEDIC CONSULTATION  REQUESTING PHYSICIAN: Edwin Dada, *  PCP:  [name]  Chief Complaint: Fall  HPI: Carrie [name] is a 81 y.o. female who presented to [facility] ED after unwitnessed fall on 05/04/2022. She had complaints of left hip pain. Imaging in the ED revealed comminuted and angulated left intertrochanteric femur fracture. Orthopaedics was consulted for evaluation and management.   She has a history of a fib, and typically takes Eliquis. This has been held.     Past Medical History:  Diagnosis Date   A-fib [facility])    Anemia    pt denies    Atrial fibrillation with rapid ventricular response ([place]) 06/25/2015   Carpal tunnel syndrome, bilateral    pt denies    Cervical spondylosis without myelopathy 10/25/2013   Dental bridge present    DJD (degenerative joint disease)    Dyspnea    sometimes hx of cancer non hodgkins lymphoma    Dysrhythmia    WENT INTO A FIB IN 2017    Elevated troponin 05/18/3886   Follicular lymphoma grade 3a ([place]) 02/03/2015   hx of nonhodgkins lymphoma x 3    Headache    sometimes a headache    History of echocardiogram    Echo 12/16: EF 60-65%, no RWMA, severe LAE   History of nuclear stress test    Myoview 1/17: EF 55%, Normal study. No ischemia or scar.   Hypertension    Hypothyroid    Nausea without vomiting 06/20/2015   Stage III chronic kidney disease ([place]) 07/01/2015   Thrush of mouth and esophagus ([place]) 06/25/2015   Past Surgical History:  Procedure Laterality Date   ABDOMINAL HYSTERECTOMY     APPENDECTOMY     BACK SURGERY     X5-lumbar-fusion   CARDIOVERSION N/A 02/17/2022   Procedure: CARDIOVERSION;  Surgeon: Berniece Salines, DO;  Location: [place] [facility];  Service: Cardiovascular;  Laterality: N/A;   COLONOSCOPY     DILATION AND CURETTAGE OF UTERUS     LYMPH NODE BIOPSY Right 01/21/2015   Procedure: RIGHT GROIN LYMPH NODE BIOPSY;  Surgeon: Erroll Luna, MD;  Location: [place];  Service: General;   Laterality: Right;   MASS EXCISION Left 08/29/2018   Procedure: EXCISION LEFT BACK  MASS;  Surgeon: Erroll Luna, MD;  Location: [place];  Service: General;  Laterality: Left;   Ovarian cyst resection     patelar tendon transplants     Left/right   PORTACATH PLACEMENT Right 02/13/2015   Procedure: INSERTION PORT-A-CATH WITH ULTRASOUND;  Surgeon: Erroll Luna, MD;  Location: [place];  Service: General;  Laterality: Right;   PORTACATH PLACEMENT N/A 08/29/2018   Procedure: INSERTION PORT-A-CATH WITH ULTRA SOUND ERAS PATHWAY;  Surgeon: Erroll Luna, MD;  Location: [place];  Service: General;  Laterality: N/A;   REVERSE SHOULDER ARTHROPLASTY Left 03/02/2021   Procedure: REVERSE SHOULDER ARTHROPLASTY;  Surgeon: Netta Cedars, MD;  Location: [facility];  Service: Orthopedics;  Laterality: Left;   RIGHT/LEFT HEART CATH AND CORONARY ANGIOGRAPHY N/A 01/15/2022   Procedure: RIGHT/LEFT HEART CATH AND CORONARY ANGIOGRAPHY;  Surgeon: [name], MD;  Location: [place] [facility];  Service: Cardiovascular;  Laterality: N/A;   TOTAL KNEE ARTHROPLASTY  2011   Right   TUBAL LIGATION     Social History   Socioeconomic History   Marital status: Widowed    Spouse name: Not on file   Number of children: 2   Years of education: hs   Highest education  level: Not on file  Occupational History   Occupation: Retired  Tobacco Use   Smoking status: Never   Smokeless tobacco: Never  Vaping Use   Vaping Use: Never used  Substance and Sexual Activity   Alcohol use: Not Currently   Drug use: No   Sexual activity: Not on file  Other Topics Concern   Not on file  Social History Narrative   Lives alone.  Has a walker for home use.   Social Determinants of Health   Financial Resource Strain: Not on file  Food Insecurity: Not on file  Transportation Needs: Not on file  Physical Activity: Not on file  Stress: Not on file  Social Connections: Not on file   Family History  Problem Relation Age of Onset    Cancer Mother        Breast, lung NHL   Cancer Sister        Multiple myeloma   Allergies  Allergen Reactions   Amiodarone Other (See Comments)    "Hyperthyroidism"....BUT, the patient is taking this (2023); Listed as an "allergy" on her paperwork [facility]   Prior to Admission medications   Medication Sig Start Date End Date Taking? Authorizing Provider  acetaminophen (TYLENOL) 500 MG tablet Take 500 mg by mouth every 6 (six) hours as needed for mild pain or headache.   Yes [provider]  amiodarone (PACERONE) 200 MG tablet Take 1 tab (200 mg total) twice daily for 1 week and then reduce to 1 tablet (200 mg total) once daily. Patient taking differently: Take 200 mg by mouth daily. 02/18/22  Yes Hongalgi, Lenis Dickinson, MD  apixaban (ELIQUIS) 5 MG TABS tablet Take 5 mg by mouth 2 (two) times daily.    Yes [provider]  Artificial Saliva (BIOTENE DRY MOUTH) LOZG Use as directed 1 lozenge in the mouth or throat every 4 (four) hours as needed (for dry mouth).   Yes [provider]  furosemide (LASIX) 40 MG tablet Take 1 tablet (40 mg total) by mouth daily. 03/04/22  Yes Little Ishikawa, MD  ipratropium-albuterol (DUONEB) 0.5-2.5 (3) MG/3ML SOLN Take 3 mLs by nebulization every 6 (six) hours as needed (for wheezing or shortness of breath).   Yes [provider]  levothyroxine (SYNTHROID) 88 MCG tablet Take 1 tablet (88 mcg total) by mouth daily before breakfast. 01/24/22  Yes Lavina Hamman, MD  Lidocaine-Prilocaine, Bulk, 2-2 % CREA Apply 1 application  topically daily as needed (:"for skin condition").   Yes [provider]  metoprolol tartrate (LOPRESSOR) 50 MG tablet Take 1 tablet (50 mg total) by mouth 2 (two) times daily. 03/03/22  Yes Little Ishikawa, MD  OXYGEN Inhale 2 L/min into the lungs as needed (for shortness of breath).   Yes [provider]  polyvinyl alcohol (LIQUIFILM TEARS) 1.4 % ophthalmic solution Place 1 drop  into both eyes every 8 (eight) hours as needed for dry eyes.   Yes [provider]  senna (SENOKOT) 8.6 MG TABS tablet Take 2 tablets by mouth daily as needed for mild constipation.   Yes [provider]  traZODone (DESYREL) 50 MG tablet Take 1 tablet (50 mg total) by mouth at bedtime. Patient taking differently: Take 50 mg by mouth every evening. 03/03/22  Yes Little Ishikawa, MD  lidocaine-prilocaine (EMLA) cream Apply 1 application topically daily as needed. Patient not taking: Reported on 05/01/2022 04/07/21   [name], MD   CT Angio Chest Pulmonary Embolism (PE)  W or WO Contrast  Result Date: 04/21/2022 CLINICAL DATA:  Recent hip fracture. High probability for pulmonary embolism. EXAM: CT ANGIOGRAPHY CHEST WITH CONTRAST TECHNIQUE: Multidetector CT imaging of the chest was performed using the standard protocol during bolus administration of intravenous contrast. Multiplanar CT image reconstructions and MIPs were obtained to evaluate the vascular anatomy. RADIATION DOSE REDUCTION: This exam was performed according to the departmental dose-optimization program which includes automated exposure control, adjustment of the mA and/or kV according to patient size and/or use of iterative reconstruction technique. CONTRAST:  62m OMNIPAQUE IOHEXOL 350 MG/ML SOLN COMPARISON:  12/30/2021 FINDINGS: Cardiovascular: Satisfactory opacification of pulmonary arteries noted, although evaluation is somewhat limited by respiratory motion artifact. No pulmonary emboli identified. Aorta is not well opacified by contrast however no evidence of thoracic aortic aneurysm. Mediastinum/Nodes: No masses or pathologically enlarged lymph nodes identified. Lungs/Pleura: Infiltrate or atelectasis is seen posterior lower lobes bilaterally. Multifocal ill-defined areas of airspace opacity are also seen in the upper lung zones bilaterally no evidence of pleural effusion. Upper abdomen: No acute findings.  Musculoskeletal: No suspicious bone lesions identified. Review of the MIP images confirms the above findings. IMPRESSION: No evidence of pulmonary embolism. Infiltrate or atelectasis in the posterior lower lobes, and multifocal ill-defined airspace opacities in upper lung zones bilaterally. Differential diagnosis includes atypical infection, inflammatory etiologies, and pulmonary edema. Electronically Signed   By: JMarlaine HindM.D.   On: 05/03/2022 18:09   DG Chest Portable 1 View  Result Date:  CLINICAL DATA:  Status post fall.  Left hip fracture. EXAM: PORTABLE CHEST 1 VIEW COMPARISON:  [place] 03/01/2022 and 02/26/2022.  CT 12/30/2021. FINDINGS: 0837 hours. Right IJ Port-A-Cath extends to the mid SVC level, stable. The heart size and mediastinal contours are stable. The interstitial opacities seen previously have largely cleared. There may be mild residual underlying fibrosis. No confluent airspace opacity, edema, pleural effusion or pneumothorax. No acute fractures are identified in the chest. There is a left shoulder reverse arthroplasty. Telemetry leads overlie the chest. IMPRESSION: No evidence of acute chest injury or definite active cardiopulmonary process. Interstitial prominence noted previously has improved. Electronically Signed   By: WRichardean SaleM.D.   On:  09:29   DG Femur Min 2 Views Left  Result Date: 05/14/2022 CLINICAL DATA:  Fall this morning. Shortening and rotation of the left leg. EXAM: PELVIS - 1-2 VIEW; LEFT FEMUR 2 VIEWS COMPARISON:  Pelvic radiographs 12/12/2007. FINDINGS: Acute intertrochanteric left femur fracture with associated comminution and varus angulation. No dislocation with significant left hip arthropathy. There are tricompartmental degenerative changes at the left knee. Mild degenerative changes are present at the right hip, and there are degenerative and postsurgical changes in the lower lumbar spine. The sacroiliac joints and symphysis pubis  appear intact. Soft tissue swelling around the proximal left hip fracture. IMPRESSION: 1. Comminuted and angulated intertrochanteric left femur fracture as described. 2. No evidence of acute pelvic fracture. Degenerative and postsurgical changes as described. Electronically Signed   By: WRichardean SaleM.D.   On: 05/15/2022 09:27   DG Pelvis 1-2 Views  Result Date: 05/07/2022 CLINICAL DATA:  Fall this morning. Shortening and rotation of the left leg. EXAM: PELVIS - 1-2 VIEW; LEFT FEMUR 2 VIEWS COMPARISON:  Pelvic radiographs 12/12/2007. FINDINGS: Acute intertrochanteric left femur fracture with associated comminution and varus angulation. No dislocation with significant left hip arthropathy. There are tricompartmental degenerative changes at the left knee. Mild degenerative changes are present at the right hip, and there are degenerative and  postsurgical changes in the lower lumbar spine. The sacroiliac joints and symphysis pubis appear intact. Soft tissue swelling around the proximal left hip fracture. IMPRESSION: 1. Comminuted and angulated intertrochanteric left femur fracture as described. 2. No evidence of acute pelvic fracture. Degenerative and postsurgical changes as described. Electronically Signed   By: Richardean Sale M.D.   On: 04/28/2022 09:27   CT HEAD WO CONTRAST (5MM)  Result Date: 05/09/2022 CLINICAL DATA:  Neck and head trauma moderate to severe, fall. History atrial fibrillation, stage III chronic kidney disease, hypertension, past history lymphoma EXAM: CT HEAD WITHOUT CONTRAST CT CERVICAL SPINE WITHOUT CONTRAST TECHNIQUE: Multidetector CT imaging of the head and cervical spine was performed following the standard protocol without intravenous contrast. Multiplanar CT image reconstructions of the cervical spine were also generated. RADIATION DOSE REDUCTION: This exam was performed according to the departmental dose-optimization program which includes automated exposure control, adjustment of  the mA and/or kV according to patient size and/or use of iterative reconstruction technique. COMPARISON:  02/02/2022 FINDINGS: CT HEAD FINDINGS Brain: Mild motion artifacts degrade exam. Generalized atrophy. Normal ventricular morphology. No midline shift or mass effect. Small vessel chronic ischemic changes of deep cerebral white matter. No intracranial hemorrhage, mass lesion, evidence of acute infarction, or extra-axial fluid collection. Vascular: No hyperdense vessels. Atherosclerotic calcification of internal carotid arteries at skull base Skull: Skull base poorly visualized due to motion artifacts, but adequately visualized on cervical spine CT. Calvaria intact. Sinuses/Orbits: Mucosal thickening LEFT maxillary sinus. Small amount of fluid and mucus within sphenoid sinus. Other: N/A CT CERVICAL SPINE FINDINGS Alignment: Anterolisthesis C3-C4. Retrolisthesis C5-C6. Remaining alignments normal. Skull base and vertebrae: Osseous demineralization. Skull base intact. Vertebral body heights maintained. Multilevel disc space narrowing and endplate spur formation C3-C4 through C6-C7. Scattered facet degenerative changes. No fracture, additional subluxation, or bone destruction. Incomplete posterior arch of C1 noted, developmental anomaly. Soft tissues and spinal canal: Prevertebral soft tissues normal thickness. Disc levels:  No specific abnormalities Upper chest: Lung apices clear Other: RIGHT jugular line. Atherosclerotic calcification aortic arch. IMPRESSION: Atrophy with small vessel chronic ischemic changes of deep cerebral white matter. No acute intracranial abnormalities. Multilevel degenerative disc and facet disease changes of the cervical spine. No acute cervical spine abnormalities. Aortic Atherosclerosis (ICD10-I70.0). Electronically Signed   By: Lavonia Dana M.D.   On: 05/13/2022 09:08   CT Cervical Spine Wo Contrast  Result Date: 05/02/2022 CLINICAL DATA:  Neck and head trauma moderate to severe, fall.  History atrial fibrillation, stage III chronic kidney disease, hypertension, past history lymphoma EXAM: CT HEAD WITHOUT CONTRAST CT CERVICAL SPINE WITHOUT CONTRAST TECHNIQUE: Multidetector CT imaging of the head and cervical spine was performed following the standard protocol without intravenous contrast. Multiplanar CT image reconstructions of the cervical spine were also generated. RADIATION DOSE REDUCTION: This exam was performed according to the departmental dose-optimization program which includes automated exposure control, adjustment of the mA and/or kV according to patient size and/or use of iterative reconstruction technique. COMPARISON:  02/02/2022 FINDINGS: CT HEAD FINDINGS Brain: Mild motion artifacts degrade exam. Generalized atrophy. Normal ventricular morphology. No midline shift or mass effect. Small vessel chronic ischemic changes of deep cerebral white matter. No intracranial hemorrhage, mass lesion, evidence of acute infarction, or extra-axial fluid collection. Vascular: No hyperdense vessels. Atherosclerotic calcification of internal carotid arteries at skull base Skull: Skull base poorly visualized due to motion artifacts, but adequately visualized on cervical spine CT. Calvaria intact. Sinuses/Orbits: Mucosal thickening LEFT maxillary sinus. Small amount of fluid and mucus within  sphenoid sinus. Other: N/A CT CERVICAL SPINE FINDINGS Alignment: Anterolisthesis C3-C4. Retrolisthesis C5-C6. Remaining alignments normal. Skull base and vertebrae: Osseous demineralization. Skull base intact. Vertebral body heights maintained. Multilevel disc space narrowing and endplate spur formation C3-C4 through C6-C7. Scattered facet degenerative changes. No fracture, additional subluxation, or bone destruction. Incomplete posterior arch of C1 noted, developmental anomaly. Soft tissues and spinal canal: Prevertebral soft tissues normal thickness. Disc levels:  No specific abnormalities Upper chest: Lung apices  clear Other: RIGHT jugular line. Atherosclerotic calcification aortic arch. IMPRESSION: Atrophy with small vessel chronic ischemic changes of deep cerebral white matter. No acute intracranial abnormalities. Multilevel degenerative disc and facet disease changes of the cervical spine. No acute cervical spine abnormalities. Aortic Atherosclerosis (ICD10-I70.0). Electronically Signed   By: Lavonia Dana M.D.   On: 04/17/2022 09:08    Positive ROS: All other systems have been reviewed and were otherwise negative with the exception of those mentioned in the HPI and as above.  Physical Exam: General: Alert, no acute distress Skin: No lesions in the area of chief complaint   MUSCULOSKELETAL:  Left Hip: ROM not assessed due to known fracture. Skin intact. Sensation and pulses intact distally.    Assessment: Comminuted and angulated left intertrochanteric femur fracture  Plan: Patient has been transferred to ICU/Step down. Awaiting medical clearance. Multiple medical comorbidities including sepsis in setting of possible PNA/UTI as well as anemia with hemodynamic instability. Hgb this morning is 6.7, receiving transfusion.  Ultimate plan is for left femur intramedullary nailing. Tentatively scheduled for this afternoon. Timing dependent on medical clearance.     Irving Copas, PA-C Cell [phone]   05/12/2022 7:33 AM

## 2022-04-19 NOTE — Brief Op Note (Signed)
05/03/2022 - 04/17/2022  4:31 PM  PATIENT:  Carrie [name]  81 y.o. female  PRE-OPERATIVE DIAGNOSIS:  Left comminuted intertrochanteric fracture  POST-OPERATIVE DIAGNOSIS:  Left comminuted intertrochanteric fracture  PROCEDURE:  Procedure(s) with comments: INTRAMEDULLARY (IM) NAIL INTERTROCHANTRIC (Left) - Biomet short nail, Hana  SURGEON:  Surgeon(s) and Role:    Paralee Cancel, MD - Primary  PHYSICIAN ASSISTANT: Costella Hatcher, PA-C  ANESTHESIA:   general  EBL:  200cc   BLOOD ADMINISTERED:none  DRAINS: none   LOCAL MEDICATIONS USED:  NONE  SPECIMEN:  No Specimen  DISPOSITION OF SPECIMEN:  N/A  COUNTS:  YES  TOURNIQUET:  * No tourniquets in log *  DICTATION: .Other Dictation: Dictation Number [id number]  PLAN OF CARE: Admit to inpatient   PATIENT DISPOSITION:  PACU - hemodynamically stable.   Delay start of Pharmacological VTE agent (>24hrs) due to surgical blood loss or risk of bleeding: no

## 2022-04-19 NOTE — Progress Notes (Signed)
After speaking with [place], NP patient will received two runs of potassium replacement and then labs will be redrawn. Will continue to monitor.

## 2022-04-19 NOTE — Assessment & Plan Note (Signed)
Appears euvolemic to dry - Hold Lasix due to hypotension - Hold metoprolol

## 2022-04-19 NOTE — Assessment & Plan Note (Signed)
Due to sepsis, hypotension, no angina or signs of ischemia.

## 2022-04-19 NOTE — Progress Notes (Addendum)
Progress Note   Patient: Carrie [name] YQM:[id number] DOB: [birth year] DOA: 04/28/2022     1 DOS: the patient was seen and examined on 05/16/2022 at 7:15AM      Brief hospital course: Mrs. Whitmyer is a 81 y.o. F with hx follicular lymphoma stage IIIa s/p chemotherapy with pancytopenia, dCHF, cAF on Eliquis, COVID last Feb with chronic cough, NOT on home O2, hypothyroidism, HTN, HLD, stage IIIb CKD who presented with confusion and unwitnessed fall with complaint of left hip pain.   In the ER, patient noted to have hypoxia, new hip fracture.  CTA chest showed no PE but confirmed multifocal infiltrates.     7/2: Admitted for hip fracture 7/3: Transferred to stepdown overnight for progressive hypotension, transfused 1 unit for Hgb 6.7     Assessment and Plan: * Hip fracture ([place]) - Consult Orthopedics, appreciate recommendations  **I would defer surgery until the patient is hemodynamically stabilized.     Acute blood loss anemia Hgb range has been 8-12 over the last 6 months, recently more 8-10.   Ovenright dropped to 6.7 in setting of fracture and ELiquis - Hold Eliquis - Transfuse 1 unit  CAP (community acquired pneumonia) See below  Acute on chronic respiratory failure with hypoxia ([place]) Here, she has new infiltrates, has had hypoxia to the mid-80s, respiratory rates in the 30s and requires 4L O2 to maintain O2 sats up from her recent baseline 3L.  At present suspect this is more atypical pneumonia than CHF  Sepsis ([place]) Presented with tachycardia, leukopenia, encephalopathy, worsening respiratory failure in the setting of worsening pulmonary infiltrates.  At this time, shock is not present - Continue Rocephin, azithromycin - Follow S pneumo ag, RVP, and histo ag, blood and sputum cultures - Pulmonary toilet  Acute metabolic encpehaloapthy At [place] lives independently and still drives.  In last 2 weeks, patient has been more forgetful, sometimes disoriented.  Here, she  is somnolent, less attentive, oriented to self and "hospital" but not place or year.  Myocardial injury Due to sepsis, hypotension, no angina or signs of ischemia.  Hypomagnesemia Supplemented and resolved  Hyponatremia Due to hypovolemia - Transfuse and monitor  Atrial fibrillation, chronic (HCC) - Hold Eliquis - Continue amiodarone - Hold metoprolol  Chronic diastolic CHF (congestive heart failure) (HCC) Appears euvolemic to dry - Hold Lasix due to hypotension - Hold metoprolol  Acquired hypercoagulable state ([place]) Due to Afib - Hold Eliquis  Pulmonary hypertension (HCC)    Leukopenia Chronic and intermittent  Essential hypertension Hypotensive here - Hold metop and furos - Transfuse blood as above, crystalloid if needed  Hypokalemia Supplemented and resolved  Stage 3a chronic kidney disease ([place]) AKI ruled out.  Cr 1.3, close to baseline. - Close monitoring renal function given hypotension  Grade 3a follicular lymphoma of lymph nodes of multiple regions ([place]) In remission on rituximab maintenance, last dose Jan 2023  Hypothyroidism TSH 2 months ago normal - Continue levothyroxine          Subjective: Somewhat disoriented, very weak, pain in the left hip.  No chest pain, no respiratory distress, no fever.  Was hypotensive overnight and had tachypnea and so was transferred to the ICU.     Physical Exam: Vitals:   04/20/2022 0638 04/23/2022 0642 04/23/2022 0645 04/23/2022 0700  BP: (!) 87/41 (!) 96/36 (!) 84/42 (!) 101/44  Pulse: (!) 56 (!) 54 (!) 54 (!) 57  Resp: 20 19 (!) 9 (!) 21  Temp:      TempSrc:  SpO2: 94% 97% 97% 96%  Weight:      Height:       Elderly adult female, lying in bed, appears debilitated and weak, sleeping, arouses sluggishly, somewhat inattentive Heart rate slow, systolic murmur noted, mild nonpitting edema in the peripheries, no frank pitting Respiratory effort seems fast but not labored, lung sounds diminished, she is  not really able to move for lung exam, I do not appreciate wheezing Abdomen soft without tenderness palpation or guarding in all quadrants I do not see bruising around the left hip, range of motion is limited by pain, this leg is foreshortened and internally rotated Attention diminished, affect blunted, judgment and insight appear somewhat impaired, strength is severely generalized weak, and I do not note facial droop, obvious difference in arm strength, or slurred speech.    Data Reviewed: CT chest, personally reviewed, shows some mild pneumonia, report reviewed, shows no pulmonary embolism CT head report reviewed, no intracranial abnormality Patient metabolic panel shows hyponatremia, stable renal function Complete blood count shows stable leukopenia, new anemia, platelets normal Respiratory virus panel pending Spoke with orthpedics.  Family Communication: Daughter by phone    Disposition: Status is: Inpatient The patient was admitted with hip fracture, she also has worsening respiratory failure, as well as hypotension and acute blood loss anemia.  She will need transfusion and close hemodynamic monitoring for possibly more fluid resuscitation through the day.  Eventually she will need surgical repair of her hip, then placement again            Author: Edwin Dada, MD 04/26/2022 7:42 AM  For on call review [email or web address].

## 2022-04-19 NOTE — Discharge Instructions (Signed)

## 2022-04-19 NOTE — Assessment & Plan Note (Addendum)
Acute kidney injury now present as creatinine went from 0.88->1.15-> 1.48.  Suspect secondary to hypotension as patient appears to be intravascularly dry due to third spacing. -Hold nephrotoxic agents

## 2022-04-19 NOTE — Anesthesia Procedure Notes (Signed)
Procedure Name: Intubation Date/Time: 05/16/2022 5:15 PM  Performed by: Gean Maidens, CRNAPre-anesthesia Checklist: Patient identified, Emergency Drugs available, Suction available, Patient being monitored and Timeout performed Patient Re-evaluated:Patient Re-evaluated prior to induction Oxygen Delivery Method: Circle system utilized Preoxygenation: Pre-oxygenation with 100% oxygen Induction Type: IV induction Ventilation: Mask ventilation without difficulty Laryngoscope Size: Mac and 4 Grade View: Grade I Tube type: Oral Tube size: 7.0 mm Number of attempts: 1 Airway Equipment and Method: Stylet Placement Confirmation: ETT inserted through vocal cords under direct vision, positive ETCO2 and breath sounds checked- equal and bilateral Secured at: 21 cm Tube secured with: Tape Dental Injury: Teeth and Oropharynx as per pre-operative assessment

## 2022-04-20 DIAGNOSIS — D62 Acute posthemorrhagic anemia: Secondary | ICD-10-CM | POA: Diagnosis not present

## 2022-04-20 DIAGNOSIS — G9341 Metabolic encephalopathy: Secondary | ICD-10-CM | POA: Diagnosis not present

## 2022-04-20 DIAGNOSIS — D6869 Other thrombophilia: Secondary | ICD-10-CM | POA: Diagnosis not present

## 2022-04-20 DIAGNOSIS — I959 Hypotension, unspecified: Secondary | ICD-10-CM | POA: Diagnosis not present

## 2022-04-20 DIAGNOSIS — S72002A Fracture of unspecified part of neck of left femur, initial encounter for closed fracture: Secondary | ICD-10-CM | POA: Diagnosis not present

## 2022-04-20 DIAGNOSIS — J189 Pneumonia, unspecified organism: Secondary | ICD-10-CM

## 2022-04-20 LAB — BASIC METABOLIC PANEL
Anion gap: 11 (ref 5–15)
BUN: 24 mg/dL — ABNORMAL HIGH (ref 8–23)
CO2: 25 mmol/L (ref 22–32)
Calcium: 7.1 mg/dL — ABNORMAL LOW (ref 8.9–10.3)
Chloride: 96 mmol/L — ABNORMAL LOW (ref 98–111)
Creatinine, Ser: 1.18 mg/dL — ABNORMAL HIGH (ref 0.44–1.00)
GFR, Estimated: 47 mL/min — ABNORMAL LOW (ref >60)
Glucose, Bld: 247 mg/dL — ABNORMAL HIGH (ref 70–99)
Potassium: 3.8 mmol/L (ref 3.5–5.1)
Sodium: 132 mmol/L — ABNORMAL LOW (ref 135–145)

## 2022-04-20 LAB — CBC
HCT: 22.8 % — ABNORMAL LOW (ref 36.0–46.0)
HCT: 29.8 % — ABNORMAL LOW (ref 36.0–46.0)
Hemoglobin: 7.5 g/dL — ABNORMAL LOW (ref 12.0–15.0)
Hemoglobin: 10 g/dL — ABNORMAL LOW (ref 12.0–15.0)
MCH: 27.4 pg (ref 26.0–34.0)
MCH: 29.4 pg (ref 26.0–34.0)
MCHC: 32.9 g/dL (ref 30.0–36.0)
MCHC: 33.6 g/dL (ref 30.0–36.0)
MCV: 83.2 fL (ref 80.0–100.0)
MCV: 87.6 fL (ref 80.0–100.0)
Platelets: 175 10*3/uL (ref 150–400)
Platelets: 181 10*3/uL (ref 150–400)
RBC: 2.74 MIL/uL — ABNORMAL LOW (ref 3.87–5.11)
RBC: 3.4 MIL/uL — ABNORMAL LOW (ref 3.87–5.11)
RDW: 18.1 % — ABNORMAL HIGH (ref 11.5–15.5)
RDW: 19.3 % — ABNORMAL HIGH (ref 11.5–15.5)
WBC: 1.7 10*3/uL — ABNORMAL LOW (ref 4.0–10.5)
WBC: 2.3 10*3/uL — ABNORMAL LOW (ref 4.0–10.5)
nRBC: 0 % (ref 0.0–0.2)
nRBC: 0 % (ref 0.0–0.2)

## 2022-04-20 LAB — PROTIME-INR
INR: 1.6 — ABNORMAL HIGH (ref 0.8–1.2)
Prothrombin Time: 18.9 seconds — ABNORMAL HIGH (ref 11.4–15.2)

## 2022-04-20 LAB — LACTIC ACID, PLASMA: Lactic Acid, Venous: 1.4 mmol/L (ref 0.5–1.9)

## 2022-04-20 LAB — PREPARE RBC (CROSSMATCH)

## 2022-04-20 MED ORDER — SODIUM CHLORIDE 0.9% IV SOLUTION: Freq: Once | INTRAVENOUS | Status: AC

## 2022-04-20 MED ORDER — GUAIFENESIN-DM 100-10 MG/5ML PO SYRP
5.0000 mL | ORAL_SOLUTION | ORAL | Status: DC | PRN
  Administered 2022-04-20: 5 mL via ORAL
  Filled 2022-04-20: qty 10

## 2022-04-20 MED ORDER — TRANEXAMIC ACID-NACL 1000-0.7 MG/100ML-% IV SOLN
1000.0000 mg | Freq: Once | INTRAVENOUS | Status: AC
Start: 2022-04-20 — End: 2022-04-20
  Administered 2022-04-19: 1000 mg via INTRAVENOUS
  Filled 2022-04-20: qty 100

## 2022-04-20 MED ORDER — CALCIUM GLUCONATE-NACL 1-0.675 GM/50ML-% IV SOLN
1.0000 g | Freq: Once | INTRAVENOUS | Status: AC
  Administered 2022-04-20: 1000 mg via INTRAVENOUS
  Filled 2022-04-20: qty 50

## 2022-04-20 MED ORDER — LACTATED RINGERS IV BOLUS
500.0000 mL | Freq: Once | INTRAVENOUS | Status: AC
Start: 2022-04-20 — End: 2022-04-20
  Administered 2022-04-20: 500 mL via INTRAVENOUS

## 2022-04-20 NOTE — Evaluation (Signed)
Occupational Therapy Evaluation Patient Details Name: Carrie [name] MRN: [id number] DOB: [birth year] Today's Date: 04/20/2022   History of Present Illness Patient is a 81 year old female who presented with confusion and L hip pain after a fall. patient was found to have a comminuted L intertrocanteric fx. patient was transitioned to step down unit on 7/3 with hypotension and acute blood loss anemia. patient underwent IM nail on 7/3 evening. PMH: non-hodgkins lymphoma, a fib s/p cardioversion, HTN, CHF, CKD, chronic cough post covid.   Clinical Impression   Patient is a 81 year old female who was admitted for above. Patient was living at local SNF after recent hospitalization prior to current hospitalization. Patient and daughter plan to move patient closer to her in [place]. Patient was noted to have decreased functional activity tolerance, decreased endurance, decreased standing balance, decreased safety awareness, and decreased knowledge of AD/AE impacting participation in ADLs.  Patient would continue to benefit from skilled OT services at this time while admitted and after d/c to address noted deficits in order to improve overall safety and independence in ADLs.       Recommendations for follow up therapy are one component of a multi-disciplinary discharge planning process, led by the attending physician.  Recommendations may be updated based on patient status, additional functional criteria and insurance authorization.   Follow Up Recommendations  Skilled nursing-short term rehab (<3 hours/day)    Assistance Recommended at Discharge Frequent or constant Supervision/Assistance  Patient can return home with the following Two people to help with walking and/or transfers;Two people to help with bathing/dressing/bathroom;Direct supervision/assist for medications management;Help with stairs or ramp for entrance;Assist for transportation;Direct supervision/assist for financial  management;Assistance with cooking/housework    Functional Status Assessment  Patient has had a recent decline in their functional status and demonstrates the ability to make significant improvements in function in a reasonable and predictable amount of time.  Equipment Recommendations  Other (comment) (defer to next venue)    Recommendations for Other Services       Precautions / Restrictions Precautions Precautions: Fall Restrictions Weight Bearing Restrictions: Yes LLE Weight Bearing: Partial weight bearing LLE Partial Weight Bearing Percentage or Pounds: up to 50%      Mobility Bed Mobility Overal bed mobility: Needs Assistance Bed Mobility: Supine to Sit, Sit to Supine     Supine to sit: +2 for physical assistance, +2 for safety/equipment, Max assist Sit to supine: Max assist, +2 for safety/equipment, +2 for physical assistance   General bed mobility comments: patient utilized helicopter method for supine to sit on edge of bed with BLE support    Transfers                          Balance Overall balance assessment: Needs assistance Sitting-balance support: Feet supported, Bilateral upper extremity supported Sitting balance-Leahy Scale: Fair     Standing balance support: Reliant on assistive device for balance, Bilateral upper extremity supported Standing balance-Leahy Scale: Poor                             ADL either performed or assessed with clinical judgement   ADL Overall ADL's : Needs assistance/impaired Eating/Feeding: Set up;Sitting   Grooming: Minimal assistance;Bed level   Upper Body Bathing: Bed level;Minimal assistance   Lower Body Bathing: Bed level;Maximal assistance   Upper Body Dressing : Bed level;Minimal assistance   Lower Body Dressing: Bed level;Maximal assistance  Toilet Transfer: +2 for safety/equipment;+2 for physical assistance Toilet Transfer Details (indicate cue type and reason): patient attempted to  stand with RW with max A x2 for standing with patient unable to tolerate standing upright with transition back to bed. patient noted to be poor reporter of pain with patient reporting 4/10 of pain with standing but facial expressions and movements with attempted standing would indicate higher pain levels. Toileting- Clothing Manipulation and Hygiene: Bed level;Total assistance               Vision Baseline Vision/History: 1 Wears glasses Patient Visual Report: No change from baseline Vision Assessment?: No apparent visual deficits     Perception     Praxis      Pertinent Vitals/Pain Pain Assessment Pain Assessment: Faces Faces Pain Scale: Hurts even more Pain Location: LLE with attempts to transfer Pain Intervention(s): Limited activity within patient's tolerance, Monitored during session, Premedicated before session, Repositioned     Hand Dominance Right   Extremity/Trunk Assessment Upper Extremity Assessment Upper Extremity Assessment: Overall WFL for tasks assessed   Lower Extremity Assessment Lower Extremity Assessment: Defer to PT evaluation   Cervical / Trunk Assessment Cervical / Trunk Assessment: Normal   Communication Communication Communication: No difficulties   Cognition Arousal/Alertness: Awake/alert Behavior During Therapy: Restless Overall Cognitive Status: History of cognitive impairments - at baseline                                 General Comments: daughter was present during session with encouagement provided for session. daughter provided PLOF.     General Comments       Exercises     Shoulder Instructions      Home Living Family/patient expects to be discharged to:: Skilled nursing facility                                 Additional Comments: Pt ambulated without assistive device, drives      Prior Functioning/Environment Prior Level of Function : Independent/Modified Independent                ADLs Comments: daughter was present reporting prior to 7 weeks ago patient was independent in ADLs with no AE        OT Problem List: Decreased activity tolerance;Impaired balance (sitting and/or standing);Decreased safety awareness;Cardiopulmonary status limiting activity;Decreased knowledge of precautions;Decreased knowledge of use of DME or AE;Pain      OT Treatment/Interventions: Self-care/ADL training;Therapeutic exercise;Neuromuscular education;Energy conservation;DME and/or AE instruction;Therapeutic activities;Balance training;Patient/family education    OT Goals(Current goals can be found in the care plan section) Acute Rehab OT Goals Patient Stated Goal: none stated OT Goal Formulation: With patient/family Time For Goal Achievement: 05/04/22 Potential to Achieve Goals: Fair  OT Frequency: Min 2X/week    Co-evaluation PT/OT/SLP Co-Evaluation/Treatment: Yes Reason for Co-Treatment: For patient/therapist safety;To address functional/ADL transfers PT goals addressed during session: Mobility/safety with mobility OT goals addressed during session: ADL's and self-care      AM-PAC OT "6 Clicks" Daily Activity     Outcome Measure Help from another person eating meals?: A Little Help from another person taking care of personal grooming?: A Little Help from another person toileting, which includes using toliet, bedpan, or urinal?: Total Help from another person bathing (including washing, rinsing, drying)?: Total Help from another person to put on and taking off regular upper body clothing?: A  Lot Help from another person to put on and taking off regular lower body clothing?: Total 6 Click Score: 11   End of Session Equipment Utilized During Treatment: Gait belt;Rolling walker (2 wheels) Nurse Communication: Mobility status  Activity Tolerance: Patient tolerated treatment well Patient left: in bed;with call bell/phone within reach;with family/visitor present;with bed alarm  set  OT Visit Diagnosis: Unsteadiness on feet (R26.81);Other abnormalities of gait and mobility (R26.89);Muscle weakness (generalized) (M62.81)                Time: 4656-8127 OT Time Calculation (min): 32 min Charges:  OT General Charges $OT Visit: 1 Visit OT Evaluation $OT Eval Moderate Complexity: 1 Mod  Jackelyn Poling OTR/L, MS Acute Rehabilitation Department Office# [phone] Pager# [phone]   Marcellina Millin 04/20/2022, 1:10 PM

## 2022-04-20 NOTE — Evaluation (Signed)
Physical Therapy Evaluation Patient Details Name: Carrie [name] MRN: [id number] DOB: 10/29/1940 Today's Date: 04/20/2022  History of Present Illness  Patient is a 81 year old female who presented with confusion and L hip pain after a fall. patient was found to have a comminuted L intertrocanteric fx. patient was transitioned to step down unit on 7/3 with hypotension and acute blood loss anemia. patient underwent IM nail on 7/3 evening. PMH: non-hodgkins lymphoma, a fib s/p cardioversion, HTN, CHF, CKD, chronic cough post covid.  Clinical Impression  The patient's BP stable during activity of mobilizing to bed edge. Attempted to stand x 1 with + 2 max. Patient unable to stand at   [facility].  Patient's daughter present to provide information.  Pt admitted with above diagnosis.  Pt currently with functional limitations due to the deficits listed below (see PT Problem List). Pt will benefit from skilled PT to increase their independence and safety with mobility to allow discharge to the venue listed below.          Recommendations for follow up therapy are one component of a multi-disciplinary discharge planning process, led by the attending physician.  Recommendations may be updated based on patient status, additional functional criteria and insurance authorization.  Follow Up Recommendations Skilled nursing-short term rehab (<3 hours/day) Can patient physically be transported by private vehicle: No    Assistance Recommended at Discharge Frequent or constant Supervision/Assistance  Patient can return home with the following  A lot of help with walking and/or transfers;A lot of help with bathing/dressing/bathroom;Help with stairs or ramp for entrance;Assist for transportation    Equipment Recommendations None recommended by PT  Recommendations for Other Services       Functional Status Assessment       Precautions / Restrictions Precautions Precautions: Fall Precaution Comments:  hypotension Restrictions Weight Bearing Restrictions: Yes LLE Weight Bearing: Partial weight bearing LLE Partial Weight Bearing Percentage or Pounds: up to 50%      Mobility  Bed Mobility               General bed mobility comments: utilized helicopter method for supine to sit on edge of bed with BLE support using bed pad    Transfers Overall transfer level: Needs assistance Equipment used: Rolling walker (2 wheels) Transfers: Sit to/from Stand Sit to Stand: Max assist, +2 safety/equipment, +2 physical assistance           General transfer comment: attmepted to stand,patient not bearing weight on right good leg. Assisted back to bed    Ambulation/Gait                  Stairs            Wheelchair Mobility    Modified Rankin (Stroke Patients Only)       Balance                                             Pertinent Vitals/Pain Pain Assessment Faces Pain Scale: Hurts even more Pain Location: LLE with attempts to transfer Pain Descriptors / Indicators: Discomfort Pain Intervention(s): Monitored during session, Premedicated before session    [place] expects to be discharged to:: Skilled nursing facility                   Additional Comments: Pt ambulated without assistive device, drives  Prior Function Prior Level of Function : Independent/Modified Independent               ADLs Comments: daughter was present reporting prior to 7 weeks ago patient was independent in ADLs with no AE     Hand Dominance   Dominant Hand: Right    Extremity/Trunk Assessment   Upper Extremity Assessment Upper Extremity Assessment: Overall WFL for tasks assessed    Lower Extremity Assessment Lower Extremity Assessment: LLE deficits/detail LLE Deficits / Details: leg tends to IR in supine, requires assistance tio move the leg in bed    Cervical / Trunk Assessment Cervical / Trunk Assessment: Normal   Communication   Communication: No difficulties  Cognition Arousal/Alertness: Awake/alert Behavior During Therapy: Restless Overall Cognitive Status: History of cognitive impairments - at baseline                                 General Comments: daughter was present during session with encouagement provided for session. daughter provided PLOF.        General Comments      Exercises     Assessment/Plan    PT Assessment Patient needs continued PT services  PT Problem List Decreased strength;Decreased mobility;Decreased safety awareness;Decreased knowledge of precautions;Decreased range of motion;Decreased activity tolerance;Decreased cognition;Decreased knowledge of use of DME;Decreased balance       PT Treatment Interventions DME instruction;Therapeutic activities;Cognitive remediation;Gait training;Therapeutic exercise;Patient/family education;Functional mobility training    PT Goals (Current goals can be found in the Care Plan section)  Acute Rehab PT Goals Patient Stated Goal: go to rehab PT Goal Formulation: With patient/family Time For Goal Achievement: 05/04/22 Potential to Achieve Goals: Fair    Frequency Min 2X/week     Co-evaluation PT/OT/SLP Co-Evaluation/Treatment: Yes Reason for Co-Treatment: For patient/therapist safety;To address functional/ADL transfers PT goals addressed during session: Mobility/safety with mobility OT goals addressed during session: ADL's and self-care       AM-PAC PT "6 Clicks" Mobility  Outcome Measure Help needed turning from your back to your side while in a flat bed without using bedrails?: Total Help needed moving from lying on your back to sitting on the side of a flat bed without using bedrails?: Total Help needed moving to and from a bed to a chair (including a wheelchair)?: Total Help needed standing up from a chair using your arms (e.g., wheelchair or bedside chair)?: Total Help needed to walk in hospital  room?: Total Help needed climbing 3-5 steps with a railing? : Total 6 Click Score: 6    End of Session Equipment Utilized During Treatment: Gait belt Activity Tolerance: Patient tolerated treatment well Patient left: in bed;with call bell/phone within reach;with bed alarm set;with family/visitor present Nurse Communication: Mobility status PT Visit Diagnosis: Unsteadiness on feet (R26.81);Pain;Repeated falls (R29.6) Pain - Right/Left: Left Pain - part of body: Hip    Time: 1050-1120 PT Time Calculation (min) (ACUTE ONLY): 30 min   Charges:   PT Evaluation $PT Eval Low Complexity: Rotonda Escalante Office [phone] Weekend [phone]   Claretha Cooper 04/20/2022, 2:22 PM

## 2022-04-20 NOTE — Progress Notes (Signed)
Progress Note   Patient: Carrie [name] BJY:[id number] DOB: 07-28-1941 DOA: 04/27/2022     2 DOS: the patient was seen and examined on 04/20/2022 at 10:20AM      Brief hospital course: Mrs. Saltz is a 81 y.o. F with hx follicular lymphoma stage IIIa s/p chemotherapy with pancytopenia, dCHF, cAF on Eliquis, COVID last Feb with chronic cough, NOT on home O2, hypothyroidism, HTN, HLD, stage IIIb CKD who presented with confusion and unwitnessed fall with complaint of left hip pain.   In the ER, patient noted to have hypoxia, new hip fracture.  CTA chest showed no PE but confirmed mild multifocal infiltrates.       7/2: Admitted for hip fracture 7/3: Developed pre-op hypotension due to ABLA requiring 2u PRBCs, stabilised and went to OR in afternoon for ORIF 7/4: Hypotensive again overnight, got 3rd unit PRBCs, started on Levophed briefly, CCM consulted     Assessment and Plan: * Hip fracture ([place]) S/p ORIF 7/3 by Dr. Alvan Dame Discussed hemodynamics yesterday with Dr. Alvan Dame prior to surgery, given mentation, BP and Hgb had all improved dramatically, we cleared her for surgery yesterday, which was uneventful. - Consult Orthopedics, appreciate recommendations  Acute blood loss anemia Hgb range has been 8-12 over the last 6 months, recently more 8-10.   Transfused 3 units so far Hgb 10 today - Hold Eliquis for now - Cryo, platelets if more transfusions - Trend Hgb  Sepsis ([place]) Presented with tachycardia, leukopenia, encephalopathy, worsening respiratory failure in the setting of worsening pulmonary infiltrates.  At this time, shock is not present  Urine culture growing Ecoli.  RVP positive for rhinovirus - Continue Rocephin, azithromycin - Pulmonary toilet  CAP (community acquired pneumonia) Agree, more likely is just viral LRTI  UTI (urinary tract infection) See above  Acute metabolic encephalopathy At baseline, patient lives alone, still drives.  Since her last hospitalzation  she has been in rehab, and has had intermittent disorientation, more cognitive processing deficits than normal.  Here, she was somnolent and sluggish on presentation due to sepsis.  THis has improved but she stil has mild delririum.    Delirium precautions:   -Lights and TV off, minimize interruptions at night  -Blinds open and lights on during day  -Glasses/hearing aid with patient  -Frequent reorientation  -PT/OT when able  -Avoid sedation medications/[place] list medications    Hypocalcemia - Supplement Ca  Myocardial injury Due to sepsis, hypotension, no angina or signs of ischemia.  Acute on chronic respiratory failure with hypoxia (HCC) Here, she had new infiltrates, has had hypoxia to the mid-80s, respiratory rates in the 30s and requires 4L O2 to maintain O2 sats up from her recent baseline 3L.  At present suspect this is more atypical pneumonia than CHF  Now mostly resolved back to basleine o2 needs  Hypomagnesemia Supplemented and resolved  Hyponatremia Due to hypovolemia, stable - Hold furosemide  Atrial fibrillation, chronic (HCC) - Hold Eliquis - Hold amiodarone for now given 1st deg AVB - Hold metoprolol  Chronic diastolic CHF (congestive heart failure) (HCC) Appears euvolemic to dry - Hold Lasix due to hypotension - Hold metoprolol  Acquired hypercoagulable state ([place]) Due to Afib - Hold Eliquis for now  Pulmonary hypertension (HCC) - Hold furosemide   Leukopenia Chronic and intermittent, stable  Essential hypertension Remains hypotensive - Hold metoprolo and furosemide - Levo weaned off this morning  Hypokalemia Supplemented and resolved  Stage 3a chronic kidney disease ([place]) AKI ruled out.  Cr stable around  baseline 1.3 - Close monitoring renal function given hypotension  Grade 3a follicular lymphoma of lymph nodes of multiple regions ([place]) In remission on rituximab maintenance, last dose Jan 2023  Hypothyroidism TSH 2 months ago  normal - Continue levothyroxine          Subjective: States she feels well, appetite is good, she has some soreness in the right leg, but is eager to work with physical therapy.  Oriented to self, daughter, hospital, although cannot member the name of the hospital and her answers are somewhat slowed.  No fever, no respiratory distress, no abdominal pain.     Physical Exam: Vitals:   04/20/22 1030 04/20/22 1100 04/20/22 1115 04/20/22 1200  BP: (!) 112/54 137/61 (!) 142/57   Pulse: 69 93 63   Resp: 20 (!) 25 (!) 22   Temp:    (!) 96.2 F (35.7 C)  TempSrc:    Axillary  SpO2: 99% 96% 94%   Weight:      Height:       Elderly adult female, lying in bed, nasal cannula in place, interactive Heart rate slow, regular, no murmurs, no peripheral edema or pitting Respiratory effort normal, lung sounds diminished, do not appreciate rales or wheezes Abdomen soft no tenderness palpation or guarding, no ascites or distention Attentive to conversation, affect appropriate, judgment insight appear mildly impaired by delirium, face symmetric, speech fluent, moves upper extremities with normal strength and coordination,    Data Reviewed: Discussed with critical care White blood cell count 2.3, CBC shows hemoglobin up to 10, platelets normal Basic metabolic panel shows persistent hyponatremia, creatinine stable at 1.2, hyperglycemia  Family Communication: Daughter at the bedside    Disposition: Status is: Inpatient         Author: Edwin Dada, MD 04/20/2022 1:44 PM  For on call review [email or web address].

## 2022-04-20 NOTE — Consult Note (Addendum)
NAME:  Carrie [name], MRN:  [id number], DOB:  Jan 25, 1941, LOS: 2 ADMISSION DATE:  04/23/2022, CONSULTATION DATE:  04/20/22 REFERRING MD:  Loleta Books, CHIEF COMPLAINT:  hypotension   History of Present Illness:  15YQ with follicular lymphoma stage IIIa s/p chemotherapy with pancytopenia, dCHF, cAF on Eliquis, COVID last Feb with chronic cough, hypothyroidism, HTN, HLD, stage IIIb CKD who presented with confusion and fall at home with left hip pain found to have CAP and started on CTX/azithro, comminuted L hip fracture s/p ORIF 7/4 course c/b pre-op hypotension/ABLA requiring 2U pRBC and TXA, post-op hypotension given 1U pRBC and 500cc bolus. She was started on levo morning of 7/4 and PCCM consulted.  Pertinent  Medical History  Follicular lymphoma stage IIIa dCHF Chronic AF Recent covid-19 infection CKD  Significant Hospital Events: Including procedures, antibiotic start and stop dates in addition to other pertinent events   7/3 admitted, ORIF  Interim History / Subjective:  N/a  Objective   Blood pressure (!) 81/65, pulse (!) 59, temperature (!) 97.5 F (36.4 C), temperature source Oral, resp. rate (!) 28, height '5\' 1"'$  (1.549 m), weight 59 kg, SpO2 97 %.        Intake/Output Summary (Last 24 hours) at 04/20/2022 0840 Last data filed at 04/20/2022 0800 Gross per 24 hour  Intake 3924.84 ml  Output 475 ml  Net 3449.84 ml   Filed Weights   04/17/2022 0643  Weight: 59 kg    Examination: General appearance: 81 y.o., female, NAD, conversant  Eyes: PERRL, tracking appropriately Lungs: CTAB, no crackles, no wheeze, with normal respiratory effort CV: borderline brady, RR, no murmur  Abdomen: Soft, non-tender; non-distended, BS hypoactive MSK: L hip dressing cdi Neuro: follows commands, grossly nonfocal    Resolved Hospital Problem list    Assessment & Plan:   Hypotension Acute blood loss anemia Comminuted L hip fracture s/p ORIF CAP, possible viral LRTI from  rhino/enterovirus UTI AF  - wean levo for MAP 65 - Give calcium  - Trend cbc and transfuse for Hb 7, would give cryo, PLTs if transfusing another round of pRBCs today - Would continue ceftriaxone for UTI, possible superimposed bacterial CAP - pulmonary toilet with flutter valve - Would hold eliquis, metoprolol, lasix - Mobilization today per ortho - Delirium precautions   Best Practice (right click and "Reselect all SmartList Selections" daily)   Diet/type: Regular consistency (see orders) DVT prophylaxis: SCD GI prophylaxis: N/A Lines: yes and it is still needed - port Foley:  N/A Code Status:  DNR Last date of multidisciplinary goals of care discussion [pt updated at bedside]  Labs   CBC: Recent Labs  Lab 05/11/2022 0834 05/02/2022 0517 04/22/2022 1352 05/02/2022 2320  WBC 2.7* 2.7* 2.5* 1.7*  NEUTROABS 2.1  --   --   --   HGB 9.0* 6.7* 9.3* 7.5*  HCT 28.5* 21.7* 28.8* 22.8*  MCV 85.1 85.8 84.0 83.2  PLT 259 235 190 657    Basic Metabolic Panel: Recent Labs  Lab 04/28/2022 0834 04/30/2022 1323 04/22/2022 2117 04/30/2022 0517 04/20/22 0634  NA 132* 132* 128* 131* 132*  K 2.9* 3.1* 3.8 3.9 3.8  CL 86* 88* 88* 91* 96*  CO2 32 32 '27 28 25  '$ GLUCOSE 173* 163* 159* 183* 247*  BUN '18 16 15 18 '$ 24*  CREATININE 1.19* 1.22* 1.20* 1.34* 1.18*  CALCIUM 8.3* 7.9* 7.5* 7.5* 7.1*  MG  --  1.5* 3.4*  --   --    GFR: Estimated Creatinine Clearance: 31.9  mL/min (A) (by C-G formula based on SCr of 1.18 mg/dL (H)). Recent Labs  Lab 04/17/2022 0834 05/17/2022 0517 05/14/2022 1352 05/01/2022 2320 04/20/22 0634  WBC 2.7* 2.7* 2.5* 1.7*  --   LATICACIDVEN  --   --   --   --  1.4    Liver Function Tests: Recent Labs  Lab 05/08/2022 0834  AST 25  ALT 20  ALKPHOS 88  BILITOT 1.1  PROT 6.4*  ALBUMIN 2.8*   No results for input(s): "LIPASE", "AMYLASE" in the last 168 hours. No results for input(s): "AMMONIA" in the last 168 hours.  ABG    Component Value Date/Time   PHART 7.396  01/15/2022 1053   PCO2ART 38.0 01/15/2022 1053   PO2ART 77 (L) 01/15/2022 1053   HCO3 24.7 01/15/2022 1114   TCO2 26 01/15/2022 1114   ACIDBASEDEF 1.0 01/15/2022 1114   O2SAT 51 01/15/2022 1114     Coagulation Profile: Recent Labs  Lab 04/20/22 0634  INR 1.6*    Cardiac Enzymes: No results for input(s): "CKTOTAL", "CKMB", "CKMBINDEX", "TROPONINI" in the last 168 hours.  HbA1C: Hgb A1c MFr Bld  Date/Time Value Ref Range Status  06/25/2015 12:15 PM 6.8 (H) 4.8 - 5.6 % Final    Comment:    (NOTE)         Pre-diabetes: 5.7 - 6.4         Diabetes: >6.4         Glycemic control for adults with diabetes: <7.0     CBG: No results for input(s): "GLUCAP" in the last 168 hours.  Review of Systems:   12 point review of systems is negative except as in HPI  Past Medical History:  She,  has a past medical history of A-fib ([place]), Anemia, Atrial fibrillation with rapid ventricular response ([place]) (06/25/2015), Carpal tunnel syndrome, bilateral, Cervical spondylosis without myelopathy (10/25/2013), Dental bridge present, DJD (degenerative joint disease), Dyspnea, Dysrhythmia, Elevated troponin (10/21/4816), Follicular lymphoma grade 3a ([place]) (02/03/2015), Headache, History of echocardiogram, History of nuclear stress test, Hypertension, Hypothyroid, Nausea without vomiting (06/20/2015), Stage III chronic kidney disease ([place]) (07/01/2015), and Thrush of mouth and esophagus ([place]) (06/25/2015).   Surgical History:   Past Surgical History:  Procedure Laterality Date   ABDOMINAL HYSTERECTOMY     APPENDECTOMY     BACK SURGERY     X5-lumbar-fusion   CARDIOVERSION N/A 02/17/2022   Procedure: CARDIOVERSION;  Surgeon: Berniece Salines, DO;  Location: [facility];  Service: Cardiovascular;  Laterality: N/A;   COLONOSCOPY     DILATION AND CURETTAGE OF UTERUS     LYMPH NODE BIOPSY Right 01/21/2015   Procedure: RIGHT GROIN LYMPH NODE BIOPSY;  Surgeon: Erroll Luna, MD;  Location: [facility];   Service: General;  Laterality: Right;   MASS EXCISION Left 08/29/2018   Procedure: EXCISION LEFT BACK  MASS;  Surgeon: Erroll Luna, MD;  Location: [facility];  Service: General;  Laterality: Left;   Ovarian cyst resection     patelar tendon transplants     Left/right   PORTACATH PLACEMENT Right 02/13/2015   Procedure: INSERTION PORT-A-CATH WITH ULTRASOUND;  Surgeon: Erroll Luna, MD;  Location: [facility];  Service: General;  Laterality: Right;   PORTACATH PLACEMENT N/A 08/29/2018   Procedure: INSERTION PORT-A-CATH WITH ULTRA SOUND ERAS PATHWAY;  Surgeon: Erroll Luna, MD;  Location: [facility];  Service: General;  Laterality: N/A;   REVERSE SHOULDER ARTHROPLASTY Left 03/02/2021   Procedure: REVERSE SHOULDER ARTHROPLASTY;  Surgeon: Netta Cedars, MD;  Location: [facility];  Service: Orthopedics;  Laterality: Left;   RIGHT/LEFT HEART CATH AND CORONARY ANGIOGRAPHY N/A 01/15/2022   Procedure: RIGHT/LEFT HEART CATH AND CORONARY ANGIOGRAPHY;  Surgeon: [name], MD;  Location: [facility];  Service: Cardiovascular;  Laterality: N/A;   TOTAL KNEE ARTHROPLASTY  2011   Right   TUBAL LIGATION       Social History:   reports that she has never smoked. She has never used smokeless tobacco. She reports that she does not currently use alcohol. She reports that she does not use drugs.   Family History:  Her family history includes Cancer in her mother and sister.   Allergies Allergies  Allergen Reactions   Amiodarone Other (See Comments)    "Hyperthyroidism"....BUT, the patient is taking this (2023); Listed as an "allergy" on her paperwork from [facility] Medications  Prior to Admission medications   Medication Sig Start Date End Date Taking? Authorizing Provider  acetaminophen (TYLENOL) 500 MG tablet Take 500 mg by mouth every 6 (six) hours as needed for mild pain or headache.   Yes [provider]  amiodarone (PACERONE) 200 MG tablet Take 1 tab (200 mg total) twice daily  for 1 week and then reduce to 1 tablet (200 mg total) once daily. Patient taking differently: Take 200 mg by mouth daily. 02/18/22  Yes Hongalgi, Lenis Dickinson, MD  apixaban (ELIQUIS) 5 MG TABS tablet Take 5 mg by mouth 2 (two) times daily.    Yes [provider]  Artificial Saliva (BIOTENE DRY MOUTH) LOZG Use as directed 1 lozenge in the mouth or throat every 4 (four) hours as needed (for dry mouth).   Yes [provider]  furosemide (LASIX) 40 MG tablet Take 1 tablet (40 mg total) by mouth daily. 03/04/22  Yes Little Ishikawa, MD  ipratropium-albuterol (DUONEB) 0.5-2.5 (3) MG/3ML SOLN Take 3 mLs by nebulization every 6 (six) hours as needed (for wheezing or shortness of breath).   Yes [provider]  levothyroxine (SYNTHROID) 88 MCG tablet Take 1 tablet (88 mcg total) by mouth daily before breakfast. 01/24/22  Yes Lavina Hamman, MD  Lidocaine-Prilocaine, Bulk, 2-2 % CREA Apply 1 application  topically daily as needed (:"for skin condition").   Yes [provider]  metoprolol tartrate (LOPRESSOR) 50 MG tablet Take 1 tablet (50 mg total) by mouth 2 (two) times daily. 03/03/22  Yes Little Ishikawa, MD  OXYGEN Inhale 2 L/min into the lungs as needed (for shortness of breath).   Yes [provider]  polyvinyl alcohol (LIQUIFILM TEARS) 1.4 % ophthalmic solution Place 1 drop into both eyes every 8 (eight) hours as needed for dry eyes.   Yes [provider]  senna (SENOKOT) 8.6 MG TABS tablet Take 2 tablets by mouth daily as needed for mild constipation.   Yes [provider]  traZODone (DESYREL) 50 MG tablet Take 1 tablet (50 mg total) by mouth at bedtime. Patient taking differently: Take 50 mg by mouth every evening. 03/03/22  Yes Little Ishikawa, MD  lidocaine-prilocaine (EMLA) cream Apply 1 application topically daily as needed. Patient not taking: Reported on 05/07/2022 04/07/21   [name], MD     Critical care time: 34 minutes

## 2022-04-20 NOTE — Progress Notes (Addendum)
OVERNIGHT PROGRESS REPORT     Reason for Consult: Hypotension requiring pressor. Postoperative hypotension requiring low-dose pressor postoperatively ORIF left hip.  BP as low as 74/40 (multiple) CCM consulted and will see the patient this AM (04/20/22)  Date: 04/20/2022  Patient name: Carrie [name] Medical record number: [id number] Date of birth: 07/13/1941 Age: 81 y.o. Gender: female PCP: [facility], [facility] Service: [facility] Lines/Tubes: Existing port  Consults: Treatment Team:  Paralee Cancel, MD    Studies: DG HIP UNILAT WITH PELVIS 2-3 VIEWS LEFT  Result Date: 05/09/2022 CLINICAL DATA:  Left femoral intramedullary nail. EXAM: DG HIP (WITH OR WITHOUT PELVIS) 2-3V LEFT COMPARISON:  Preoperative radiograph yesterday FINDINGS: Three fluoroscopic spot views of the left hip obtained in the operating room. Short intramedullary rod with trans trochanteric and distal screw fixation of intertrochanteric femur fracture. Fluoroscopy time 44 seconds. Dose 17.34 mGy. IMPRESSION: Procedural fluoroscopy for ORIF of left intertrochanteric femur fracture. Electronically Signed   By: Keith Rake M.D.   On: 05/08/2022 19:50   DG C-Arm 1-60 Min-No Report  Result Date: 04/23/2022 Fluoroscopy was utilized by the requesting physician.  No radiographic interpretation.   CT Angio Chest Pulmonary Embolism (PE) W or WO Contrast  Result Date: 05/13/2022 CLINICAL DATA:  Recent hip fracture. High probability for pulmonary embolism. EXAM: CT ANGIOGRAPHY CHEST WITH CONTRAST TECHNIQUE: Multidetector CT imaging of the chest was performed using the standard protocol during bolus administration of intravenous contrast. Multiplanar CT image reconstructions and MIPs were obtained to evaluate the vascular anatomy. RADIATION DOSE REDUCTION: This exam was performed according to the departmental dose-optimization program which includes automated exposure control, adjustment of the mA and/or  kV according to patient size and/or use of iterative reconstruction technique. CONTRAST:  56m OMNIPAQUE IOHEXOL 350 MG/ML SOLN COMPARISON:  12/30/2021 FINDINGS: Cardiovascular: Satisfactory opacification of pulmonary arteries noted, although evaluation is somewhat limited by respiratory motion artifact. No pulmonary emboli identified. Aorta is not well opacified by contrast however no evidence of thoracic aortic aneurysm. Mediastinum/Nodes: No masses or pathologically enlarged lymph nodes identified. Lungs/Pleura: Infiltrate or atelectasis is seen posterior lower lobes bilaterally. Multifocal ill-defined areas of airspace opacity are also seen in the upper lung zones bilaterally no evidence of pleural effusion. Upper abdomen: No acute findings. Musculoskeletal: No suspicious bone lesions identified. Review of the MIP images confirms the above findings. IMPRESSION: No evidence of pulmonary embolism. Infiltrate or atelectasis in the posterior lower lobes, and multifocal ill-defined airspace opacities in upper lung zones bilaterally. Differential diagnosis includes atypical infection, inflammatory etiologies, and pulmonary edema. Electronically Signed   By: JMarlaine HindM.D.   On: 05/12/2022 18:09   DG Chest Portable 1 View  Result Date: 04/21/2022 CLINICAL DATA:  Status post fall.  Left hip fracture. EXAM: PORTABLE CHEST 1 VIEW COMPARISON:  [facility] 03/01/2022 and 02/26/2022.  CT 12/30/2021. FINDINGS: 0837 hours. Right IJ Port-A-Cath extends to the mid SVC level, stable. The heart size and mediastinal contours are stable. The interstitial opacities seen previously have largely cleared. There may be mild residual underlying fibrosis. No confluent airspace opacity, edema, pleural effusion or pneumothorax. No acute fractures are identified in the chest. There is a left shoulder reverse arthroplasty. Telemetry leads overlie the chest. IMPRESSION: No evidence of acute chest injury or definite active cardiopulmonary  process. Interstitial prominence noted previously has improved. Electronically Signed   By: WRichardean SaleM.D.   On: 04/23/2022 09:29   DG Femur Min 2 Views Left  Result Date: 05/05/2022 CLINICAL  DATA:  Fall this morning. Shortening and rotation of the left leg. EXAM: PELVIS - 1-2 VIEW; LEFT FEMUR 2 VIEWS COMPARISON:  Pelvic radiographs 12/12/2007. FINDINGS: Acute intertrochanteric left femur fracture with associated comminution and varus angulation. No dislocation with significant left hip arthropathy. There are tricompartmental degenerative changes at the left knee. Mild degenerative changes are present at the right hip, and there are degenerative and postsurgical changes in the lower lumbar spine. The sacroiliac joints and symphysis pubis appear intact. Soft tissue swelling around the proximal left hip fracture. IMPRESSION: 1. Comminuted and angulated intertrochanteric left femur fracture as described. 2. No evidence of acute pelvic fracture. Degenerative and postsurgical changes as described. Electronically Signed   By: Richardean Sale M.D.   On: 05/17/2022 09:27   DG Pelvis 1-2 Views  Result Date: 04/24/2022 CLINICAL DATA:  Fall this morning. Shortening and rotation of the left leg. EXAM: PELVIS - 1-2 VIEW; LEFT FEMUR 2 VIEWS COMPARISON:  Pelvic radiographs 12/12/2007. FINDINGS: Acute intertrochanteric left femur fracture with associated comminution and varus angulation. No dislocation with significant left hip arthropathy. There are tricompartmental degenerative changes at the left knee. Mild degenerative changes are present at the right hip, and there are degenerative and postsurgical changes in the lower lumbar spine. The sacroiliac joints and symphysis pubis appear intact. Soft tissue swelling around the proximal left hip fracture. IMPRESSION: 1. Comminuted and angulated intertrochanteric left femur fracture as described. 2. No evidence of acute pelvic fracture. Degenerative and postsurgical changes  as described. Electronically Signed   By: Richardean Sale M.D.   On: 04/22/2022 09:27   CT HEAD WO CONTRAST (5MM)  Result Date: 05/13/2022 CLINICAL DATA:  Neck and head trauma moderate to severe, fall. History atrial fibrillation, stage III chronic kidney disease, hypertension, past history lymphoma EXAM: CT HEAD WITHOUT CONTRAST CT CERVICAL SPINE WITHOUT CONTRAST TECHNIQUE: Multidetector CT imaging of the head and cervical spine was performed following the standard protocol without intravenous contrast. Multiplanar CT image reconstructions of the cervical spine were also generated. RADIATION DOSE REDUCTION: This exam was performed according to the departmental dose-optimization program which includes automated exposure control, adjustment of the mA and/or kV according to patient size and/or use of iterative reconstruction technique. COMPARISON:  02/02/2022 FINDINGS: CT HEAD FINDINGS Brain: Mild motion artifacts degrade exam. Generalized atrophy. Normal ventricular morphology. No midline shift or mass effect. Small vessel chronic ischemic changes of deep cerebral white matter. No intracranial hemorrhage, mass lesion, evidence of acute infarction, or extra-axial fluid collection. Vascular: No hyperdense vessels. Atherosclerotic calcification of internal carotid arteries at skull base Skull: Skull base poorly visualized due to motion artifacts, but adequately visualized on cervical spine CT. Calvaria intact. Sinuses/Orbits: Mucosal thickening LEFT maxillary sinus. Small amount of fluid and mucus within sphenoid sinus. Other: N/A CT CERVICAL SPINE FINDINGS Alignment: Anterolisthesis C3-C4. Retrolisthesis C5-C6. Remaining alignments normal. Skull base and vertebrae: Osseous demineralization. Skull base intact. Vertebral body heights maintained. Multilevel disc space narrowing and endplate spur formation C3-C4 through C6-C7. Scattered facet degenerative changes. No fracture, additional subluxation, or bone  destruction. Incomplete posterior arch of C1 noted, developmental anomaly. Soft tissues and spinal canal: Prevertebral soft tissues normal thickness. Disc levels:  No specific abnormalities Upper chest: Lung apices clear Other: RIGHT jugular line. Atherosclerotic calcification aortic arch. IMPRESSION: Atrophy with small vessel chronic ischemic changes of deep cerebral white matter. No acute intracranial abnormalities. Multilevel degenerative disc and facet disease changes of the cervical spine. No acute cervical spine abnormalities. Aortic Atherosclerosis (ICD10-I70.0). Electronically Signed  By: Lavonia Dana M.D.   On: 04/28/2022 09:08   CT Cervical Spine Wo Contrast  Result Date: 04/17/2022 CLINICAL DATA:  Neck and head trauma moderate to severe, fall. History atrial fibrillation, stage III chronic kidney disease, hypertension, past history lymphoma EXAM: CT HEAD WITHOUT CONTRAST CT CERVICAL SPINE WITHOUT CONTRAST TECHNIQUE: Multidetector CT imaging of the head and cervical spine was performed following the standard protocol without intravenous contrast. Multiplanar CT image reconstructions of the cervical spine were also generated. RADIATION DOSE REDUCTION: This exam was performed according to the departmental dose-optimization program which includes automated exposure control, adjustment of the mA and/or kV according to patient size and/or use of iterative reconstruction technique. COMPARISON:  02/02/2022 FINDINGS: CT HEAD FINDINGS Brain: Mild motion artifacts degrade exam. Generalized atrophy. Normal ventricular morphology. No midline shift or mass effect. Small vessel chronic ischemic changes of deep cerebral white matter. No intracranial hemorrhage, mass lesion, evidence of acute infarction, or extra-axial fluid collection. Vascular: No hyperdense vessels. Atherosclerotic calcification of internal carotid arteries at skull base Skull: Skull base poorly visualized due to motion artifacts, but adequately  visualized on cervical spine CT. Calvaria intact. Sinuses/Orbits: Mucosal thickening LEFT maxillary sinus. Small amount of fluid and mucus within sphenoid sinus. Other: N/A CT CERVICAL SPINE FINDINGS Alignment: Anterolisthesis C3-C4. Retrolisthesis C5-C6. Remaining alignments normal. Skull base and vertebrae: Osseous demineralization. Skull base intact. Vertebral body heights maintained. Multilevel disc space narrowing and endplate spur formation C3-C4 through C6-C7. Scattered facet degenerative changes. No fracture, additional subluxation, or bone destruction. Incomplete posterior arch of C1 noted, developmental anomaly. Soft tissues and spinal canal: Prevertebral soft tissues normal thickness. Disc levels:  No specific abnormalities Upper chest: Lung apices clear Other: RIGHT jugular line. Atherosclerotic calcification aortic arch. IMPRESSION: Atrophy with small vessel chronic ischemic changes of deep cerebral white matter. No acute intracranial abnormalities. Multilevel degenerative disc and facet disease changes of the cervical spine. No acute cervical spine abnormalities. Aortic Atherosclerosis (ICD10-I70.0). Electronically Signed   By: Lavonia Dana M.D.   On: 05/05/2022 09:08     Key Events: Intraoperative hypotension requiring pressor. Postoperative hypotension requiring restart of pressor, blood transfusion for hemoglobin of 7.5.  Patient previously had blood transfusion due to a drop to 6.7 Patient is awake and oriented with no obvious or stated distress.  History of Present Illness: 81 year old female with medical history significant of PAF, chronic HFpEF , Follicular lymphoma stage IIIa post chemo, hypothyroidism, HTN, HLD, stage IIIb CKD. Admitted with left femur fracture s/p mechanical fall and sepsis.  Original admitting assessments  Left femur fracture s/p fall UTI Acute on chronic hypoxic respiratory failure Possible PNA Hypokalemia  Elevated BNP Paroxysmal AF Follicular lymphoma   IIIA CKD IIIB Chronic hyponatremia Hypothyroidism  Eliquis was held with admission and tranexamic acid was given 2 units PRBC were given Patient became hypotensive in OR and was on phenylephrine which was weaned off.  Patient became hypotensive in stepdown unit and despite 500 mL bolus required Levophed at 3 mcg.  CCM is consulted and will see patient this a.m.(04/20/2022)   Allergies: Amiodarone  Past Medical History:  Diagnosis Date   A-fib ([place])    Anemia    pt denies    Atrial fibrillation with rapid ventricular response ([place]) 06/25/2015   Carpal tunnel syndrome, bilateral    pt denies    Cervical spondylosis without myelopathy 10/25/2013   Dental bridge present    DJD (degenerative joint disease)    Dyspnea    sometimes hx of cancer non  hodgkins lymphoma    Dysrhythmia    WENT INTO A FIB IN 2017    Elevated troponin 06/23/2228   Follicular lymphoma grade 3a ([place]) 02/03/2015   hx of nonhodgkins lymphoma x 3    Headache    sometimes a headache    History of echocardiogram    Echo 12/16: EF 60-65%, no RWMA, severe LAE   History of nuclear stress test    Myoview 1/17: EF 55%, Normal study. No ischemia or scar.   Hypertension    Hypothyroid    Nausea without vomiting 06/20/2015   Stage III chronic kidney disease ([place]) 07/01/2015   Thrush of mouth and esophagus ([place]) 06/25/2015    Past Surgical History:  Procedure Laterality Date   ABDOMINAL HYSTERECTOMY     APPENDECTOMY     BACK SURGERY     X5-lumbar-fusion   CARDIOVERSION N/A 02/17/2022   Procedure: CARDIOVERSION;  Surgeon: Berniece Salines, DO;  Location: [facility];  Service: Cardiovascular;  Laterality: N/A;   COLONOSCOPY     DILATION AND CURETTAGE OF UTERUS     LYMPH NODE BIOPSY Right 01/21/2015   Procedure: RIGHT GROIN LYMPH NODE BIOPSY;  Surgeon: Erroll Luna, MD;  Location: [facility];  Service: General;  Laterality: Right;   MASS EXCISION Left 08/29/2018   Procedure: EXCISION LEFT BACK  MASS;   Surgeon: Erroll Luna, MD;  Location: [facility];  Service: General;  Laterality: Left;   Ovarian cyst resection     patelar tendon transplants     Left/right   PORTACATH PLACEMENT Right 02/13/2015   Procedure: INSERTION PORT-A-CATH WITH ULTRASOUND;  Surgeon: Erroll Luna, MD;  Location: [facility];  Service: General;  Laterality: Right;   PORTACATH PLACEMENT N/A 08/29/2018   Procedure: INSERTION PORT-A-CATH WITH ULTRA SOUND ERAS PATHWAY;  Surgeon: Erroll Luna, MD;  Location: [facility];  Service: General;  Laterality: N/A;   REVERSE SHOULDER ARTHROPLASTY Left 03/02/2021   Procedure: REVERSE SHOULDER ARTHROPLASTY;  Surgeon: Netta Cedars, MD;  Location: [facility];  Service: Orthopedics;  Laterality: Left;   RIGHT/LEFT HEART CATH AND CORONARY ANGIOGRAPHY N/A 01/15/2022   Procedure: RIGHT/LEFT HEART CATH AND CORONARY ANGIOGRAPHY;  Surgeon: [name], MD;  Location: [facility];  Service: Cardiovascular;  Laterality: N/A;   TOTAL KNEE ARTHROPLASTY  2011   Right   TUBAL LIGATION      Family History  Problem Relation Age of Onset   Cancer Mother        Breast, lung NHL   Cancer Sister        Multiple myeloma    Social History   Socioeconomic History   Marital status: Widowed    Spouse name: Not on file   Number of children: 2   Years of education: hs   Highest education level: Not on file  Occupational History   Occupation: Retired  Tobacco Use   Smoking status: Never   Smokeless tobacco: Never  Vaping Use   Vaping Use: Never used  Substance and Sexual Activity   Alcohol use: Not Currently   Drug use: No   Sexual activity: Not on file  Other Topics Concern   Not on file  Social History Narrative   Lives alone.  Has a walker for home use.   Social Determinants of Health   Financial Resource Strain: Not on file  Food Insecurity: Not on file  Transportation Needs: Not on file  Physical Activity: Not on file  Stress: Not on file  Social Connections: Not  on file   Intimate Partner Violence: Not on file   Prior to Admission medications   Medication Sig Start Date End Date Taking? Authorizing Provider  acetaminophen (TYLENOL) 500 MG tablet Take 500 mg by mouth every 6 (six) hours as needed for mild pain or headache.   Yes [provider]  amiodarone (PACERONE) 200 MG tablet Take 1 tab (200 mg total) twice daily for 1 week and then reduce to 1 tablet (200 mg total) once daily. Patient taking differently: Take 200 mg by mouth daily. 02/18/22  Yes Hongalgi, Lenis Dickinson, MD  apixaban (ELIQUIS) 5 MG TABS tablet Take 5 mg by mouth 2 (two) times daily.    Yes [provider]  Artificial Saliva (BIOTENE DRY MOUTH) LOZG Use as directed 1 lozenge in the mouth or throat every 4 (four) hours as needed (for dry mouth).   Yes [provider]  furosemide (LASIX) 40 MG tablet Take 1 tablet (40 mg total) by mouth daily. 03/04/22  Yes Little Ishikawa, MD  ipratropium-albuterol (DUONEB) 0.5-2.5 (3) MG/3ML SOLN Take 3 mLs by nebulization every 6 (six) hours as needed (for wheezing or shortness of breath).   Yes [provider]  levothyroxine (SYNTHROID) 88 MCG tablet Take 1 tablet (88 mcg total) by mouth daily before breakfast. 01/24/22  Yes Lavina Hamman, MD  Lidocaine-Prilocaine, Bulk, 2-2 % CREA Apply 1 application  topically daily as needed (:"for skin condition").   Yes [provider]  metoprolol tartrate (LOPRESSOR) 50 MG tablet Take 1 tablet (50 mg total) by mouth 2 (two) times daily. 03/03/22  Yes Little Ishikawa, MD  OXYGEN Inhale 2 L/min into the lungs as needed (for shortness of breath).   Yes [provider]  polyvinyl alcohol (LIQUIFILM TEARS) 1.4 % ophthalmic solution Place 1 drop into both eyes every 8 (eight) hours as needed for dry eyes.   Yes [provider]  senna (SENOKOT) 8.6 MG TABS tablet Take 2 tablets by mouth daily as needed for mild constipation.   Yes [provider]  traZODone  (DESYREL) 50 MG tablet Take 1 tablet (50 mg total) by mouth at bedtime. Patient taking differently: Take 50 mg by mouth every evening. 03/03/22  Yes Little Ishikawa, MD  lidocaine-prilocaine (EMLA) cream Apply 1 application topically daily as needed. Patient not taking: Reported on 04/20/2022 04/07/21   [name], MD     Vitals  Vitals:   04/20/22 0015 04/20/22 0021 04/20/22 0024 04/20/22 0028  BP: (!) 147/69 (!) 130/93 126/63 105/72  Pulse: (!) 59 (!) 58 (!) 57 (!) 57  Resp: '17 17 17 17  ' Temp:    (!) 96.9 F (36.1 C)  TempSrc:    Axillary  SpO2: 100% 99% 100% 100%  Weight:      Height:        Assessment & Plan  Wean pressor as able for a MAP > 65 Transfuse 1 unit PRBC Lactic acid with a.m. lab PT/INR with a.m. lab  Consult to CCM- (completed)    Gershon Cull MSNA MSN ACNPC-AG Acute Care Nurse Practitioner [place]   04/20/2022

## 2022-04-20 NOTE — TOC Initial Note (Signed)
Transition of Care [facility]) - Initial/Assessment Note    Patient Details  Name: Carrie [name] MRN: [id number] Date of Birth: 02-07-1941  Transition of Care [facility]) CM/SW Contact:    Leeroy Cha, RN Phone Number: 04/20/2022, 7:33 AM  Clinical Narrative:                 [id number] of the left hip on 05/05/2022 after sustaining fall at home.  Will follow for possible snf placement.  Expected Discharge Plan: Skilled Nursing Facility Barriers to Discharge: Continued Medical Work up   Patient Goals and CMS Choice Patient states their goals for this hospitalization and ongoing recovery are:: to go home or rehab if needed CMS Medicare.gov Compare Post Acute Care list provided to:: Patient Choice offered to / list presented to : Patient  Expected Discharge Plan and Services Expected Discharge Plan: [place]   Discharge Planning Services: CM Consult Post Acute Care Choice: [place] Living arrangements for the past 2 months: Single Family Home                                      Prior Living Arrangements/Services Living arrangements for the past 2 months: Single Family Home Lives with:: Self (widowed) Patient language and need for interpreter reviewed:: Yes Do you feel safe going back to the place where you live?: Yes            Criminal Activity/Legal Involvement Pertinent to Current Situation/Hospitalization: No - Comment as needed  Activities of Daily Living Home Assistive Devices/Equipment: None ADL Screening (condition at time of admission) Patient's cognitive ability adequate to safely complete daily activities?: No Is the patient deaf or have difficulty hearing?: No Does the patient have difficulty seeing, even when wearing glasses/contacts?: No Does the patient have difficulty concentrating, remembering, or making decisions?: Yes Patient able to express need for assistance with ADLs?: Yes Does the patient have difficulty dressing  or bathing?: Yes Independently performs ADLs?: No Communication: Independent, Needs assistance Is this a change from baseline?: Pre-admission baseline (dgt reports cognitive changes) Dressing (OT): Needs assistance Is this a change from baseline?: Pre-admission baseline Grooming: Needs assistance Is this a change from baseline?: Pre-admission baseline Feeding: Independent Bathing: Needs assistance Is this a change from baseline?: Pre-admission baseline Toileting: Needs assistance Is this a change from baseline?: Pre-admission baseline In/Out Bed: Needs assistance Is this a change from baseline?: Pre-admission baseline Walks in Home: Needs assistance Is this a change from baseline?: Pre-admission baseline Does the patient have difficulty walking or climbing stairs?: Yes Weakness of Legs: Left Weakness of Arms/Hands: Right  Permission Sought/Granted                  Emotional Assessment Appearance:: Appears stated age     Orientation: : Oriented to Place, Oriented to Self, Oriented to  Time, Oriented to Situation Alcohol / Substance Use: Not Applicable Psych Involvement: No (comment)  Admission diagnosis:  Hip fracture ([place]) [S72.009A] Hypoxia [R09.02] Closed fracture of left hip, initial encounter ([place]) [S72.002A] Fall in home, initial encounter [W19.Merril Abbe, Y92.009] Patient Active Problem List   Diagnosis Date Noted   Hyponatremia 05/14/2022   Hypomagnesemia 04/23/2022   Acute on chronic respiratory failure with hypoxia ([place]) 05/04/2022   Acute blood loss anemia 05/08/2022   CAP (community acquired pneumonia) 05/05/2022   Myocardial injury 00/86/7619   Acute metabolic encephalopathy 50/93/2671   Hip fracture ([place]) 05/06/2022  Orthostatic hypotension    A-fib (HCC) 02/21/2022   Diarrhea 02/17/2022   Asymptomatic bacteriuria 02/17/2022   Chronic anemia 02/17/2022   Head trauma 02/02/2022   HFimpEF (Heart Failure with improved EF) 02/01/2022   Coronary artery  disease involving native coronary artery of native heart without angina pectoris 02/01/2022   Acute renal failure superimposed on stage 3a chronic kidney disease ([place]) 01/23/2022   Chronic atrial fibrillation with RVR ([place]) 01/22/2022   Atrial fibrillation ([place]) 01/21/2022   Atrial fibrillation, chronic ([place]) 01/21/2022   Aortic atherosclerosis ([place]) 01/10/2022   Chronic diastolic CHF (congestive heart failure) ([place]) 12/30/2021   Headache 12/30/2021   Acute otitis media 10/27/2021   Acquired hypercoagulable state ([place]) 09/22/2021   Influenza A 09/15/2021   Cough in adult 09/09/2021   H/O total shoulder replacement, left 03/02/2021   Deficiency anemia 02/03/2021   Pulmonary hypertension ([place]) 10/27/2020   Thrombocytopenia ([place]) 09/01/2020   Goals of care, counseling/discussion 07/23/2020   Leukopenia 09/25/2019   Syncope 03/05/2019   Postural dizziness with presyncope 03/04/2019   Atypical chest pain    Near syncope    Skin lesions 06/06/2017   Shortness of breath 03/25/2017   Anxiety disorder 03/25/2017   Generalized weakness 03/19/2017   Insomnia disorder 03/19/2017   Dysuria 08/31/2016   Acute back pain with sciatica, right 08/31/2016   Chronic back pain 07/02/2016   Quality of life palliative care encounter 03/02/2016   Port catheter in place 03/01/2016   Skin rash 11/03/2015   Hypokalemia 07/02/2015   Essential hypertension 07/02/2015   Diabetes mellitus, likely due to steroids 06/27/2015   Sepsis ([place]) 06/26/2015   Stage 3a chronic kidney disease ([place]) 06/25/2015   Chronic knee pain after total replacement of right knee joint 04/09/2015   Chronic left shoulder pain 03/31/2015   Other fatigue 19/37/9024   Grade 3a follicular lymphoma of lymph nodes of multiple regions ([place]) 02/03/2015   Hypothyroidism 09/09/2009   PCP:  Clinic, [place]:  No Pharmacies Listed    Social Determinants of Health (SDOH) Interventions    Readmission Risk  Interventions    02/26/2022   10:30 AM 02/22/2022    1:48 PM  Readmission Risk Prevention Plan  Transportation Screening Complete Complete  Medication Review Press photographer) Complete Complete  PCP or Specialist appointment within 3-5 days of discharge Complete Complete  HRI or Home Care Consult Complete Complete  SW Recovery Care/Counseling Consult Complete Complete  Palliative Care Screening Not Applicable Not [place] Not Applicable Not Applicable

## 2022-04-20 NOTE — Assessment & Plan Note (Signed)
Agree, more likely is just viral LRTI

## 2022-04-20 NOTE — TOC Progression Note (Addendum)
Transition of Care [facility]) - Progression Note    Patient Details  Name: Carrie [name] MRN: [id number] Date of Birth: 27-Oct-1940  Transition of Care [facility]) CM/SW Contact  Leeroy Cha, RN Phone Number: 04/20/2022, 10:36 AM  Clinical Narrative:    Tct-daughter- message left for her to call back concerning snf's in the [place] area. Tcf-daughter-would like for patient to go to [facility] [place].  Does not want her going back to [facility]. [facility] in [place] is [phone].  No answer when called due to holiday. [facility] is the second choice at [phone]. Will await until Wednesday to get fax number.  Need pt evaluations. Expected Discharge Plan: [place] Barriers to Discharge: Continued Medical Work up  Expected Discharge Plan and Services Expected Discharge Plan: [place]   Discharge Planning Services: CM Consult Post Acute Care Choice: [place] Living arrangements for the past 2 months: Single Family Home                                       Social Determinants of Health (SDOH) Interventions    Readmission Risk Interventions    02/26/2022   10:30 AM 02/22/2022    1:48 PM  Readmission Risk Prevention Plan  Transportation Screening Complete Complete  Medication Review Press photographer) Complete Complete  PCP or Specialist appointment within 3-5 days of discharge Complete Complete  HRI or [place] Complete Complete  SW Recovery Care/Counseling Consult Complete Complete  Palliative Care Screening Not Applicable Not [place] Not Applicable Not Applicable

## 2022-04-20 NOTE — Assessment & Plan Note (Addendum)
At baseline, patient lives alone, still drives.  Since her last hospitalzation she has been in rehab, and has had intermittent disorientation, more cognitive processing deficits than normal.  Here, she was somnolent and sluggish on presentation due to sepsis.   During the first two days of hospital stay, before and immediately after surgery, she was oriented and mostly appropriate. Remains delirious - Delirium precautions

## 2022-04-20 NOTE — Progress Notes (Signed)
Subjective: 1 Day Post-Op Procedure(s) (LRB): INTRAMEDULLARY (IM) NAIL INTERTROCHANTRIC (Left) Patient reports pain as mild.   Patient seen in rounds for Dr. Alvan Dame. Patient is resting in bed this morning on exam. CCM was consulted overnight due to hypotension requiring pressors. Patient tells me she is comfortable at rest and does not feel she needs pain medication. She is wondering about if she is allowed to move her hip.  We will start therapy today.   Objective: Vital signs in last 24 hours: Temp:  [95 F (35 C)-98 F (36.7 C)] 97.5 F (36.4 C) (07/04 0800) Pulse Rate:  [38-118] 59 (07/04 0800) Resp:  [12-31] 28 (07/04 0800) BP: (67-163)/(35-117) 81/65 (07/04 0800) SpO2:  [82 %-100 %] 97 % (07/04 0800)  Intake/Output from previous day:  Intake/Output Summary (Last 24 hours) at 04/20/2022 0841 Last data filed at 04/20/2022 0800 Gross per 24 hour  Intake 3924.84 ml  Output 475 ml  Net 3449.84 ml     Intake/Output this shift: Total I/O In: 2.7 [I.V.:2.7] Out: -   Labs: Recent Labs    05/14/2022 0834 05/09/2022 0517 05/03/2022 1352 04/17/2022 2320  HGB 9.0* 6.7* 9.3* 7.5*   Recent Labs    05/08/2022 1352 05/07/2022 2320  WBC 2.5* 1.7*  RBC 3.43* 2.74*  HCT 28.8* 22.8*  PLT 190 175   Recent Labs    05/06/2022 0517 04/20/22 0634  NA 131* 132*  K 3.9 3.8  CL 91* 96*  CO2 28 25  BUN 18 24*  CREATININE 1.34* 1.18*  GLUCOSE 183* 247*  CALCIUM 7.5* 7.1*   Recent Labs    04/20/22 0634  INR 1.6*    Exam: General - Patient is Alert and Oriented Extremity - Neurologically intact Sensation intact distally Intact pulses distally Dorsiflexion/Plantar flexion intact Dressing - dressing C/D/I Motor Function - intact, moving foot and toes well on exam.   Past Medical History:  Diagnosis Date   A-fib ([place])    Anemia    pt denies    Atrial fibrillation with rapid ventricular response (HCC) 06/25/2015   Carpal tunnel syndrome, bilateral    pt denies    Cervical  spondylosis without myelopathy 10/25/2013   Dental bridge present    DJD (degenerative joint disease)    Dyspnea    sometimes hx of cancer non hodgkins lymphoma    Dysrhythmia    WENT INTO A FIB IN 2017    Elevated troponin 10/23/1094   Follicular lymphoma grade 3a ([place]) 02/03/2015   hx of nonhodgkins lymphoma x 3    Headache    sometimes a headache    History of echocardiogram    Echo 12/16: EF 60-65%, no RWMA, severe LAE   History of nuclear stress test    Myoview 1/17: EF 55%, Normal study. No ischemia or scar.   Hypertension    Hypothyroid    Nausea without vomiting 06/20/2015   Stage III chronic kidney disease ([place]) 07/01/2015   Thrush of mouth and esophagus ([place]) 06/25/2015    Assessment/Plan: 1 Day Post-Op Procedure(s) (LRB): INTRAMEDULLARY (IM) NAIL INTERTROCHANTRIC (Left) Principal Problem:   Hip fracture (HCC) Active Problems:   Hypothyroidism   Grade 3a follicular lymphoma of lymph nodes of multiple regions (HCC)   Stage 3a chronic kidney disease (HCC)   Sepsis (HCC)   Hypokalemia   Essential hypertension   Leukopenia   Pulmonary hypertension (HCC)   Acquired hypercoagulable state ([place])   Chronic diastolic CHF (congestive heart failure) (HCC)   Atrial fibrillation, chronic (  [place])   Hyponatremia   Hypomagnesemia   Acute on chronic respiratory failure with hypoxia (HCC)   Acute blood loss anemia   CAP (community acquired pneumonia)   Myocardial injury   Acute metabolic encephalopathy  Estimated body mass index is 24.58 kg/m as calculated from the following:   Height as of this encounter: '5\' 1"'$  (1.549 m).   Weight as of this encounter: 59 kg. Advance diet Up with therapy  DVT Prophylaxis -  Eliquis 5 mg BID may be resumed whenever medically appropriate per medicine/CCM PWB 50% LLE  Hgb 7.5 this AM.  Plan to get up with PT as able.  Likely SNF upon discharge. Patient tells me she was just at [facility].   Griffith Citron, PA-C Orthopedic Surgery [phone] 04/20/2022, 8:41 AM

## 2022-04-20 NOTE — Assessment & Plan Note (Signed)
Supplemented 

## 2022-04-21 DIAGNOSIS — G9341 Metabolic encephalopathy: Secondary | ICD-10-CM | POA: Diagnosis not present

## 2022-04-21 DIAGNOSIS — D62 Acute posthemorrhagic anemia: Secondary | ICD-10-CM | POA: Diagnosis not present

## 2022-04-21 DIAGNOSIS — S72002A Fracture of unspecified part of neck of left femur, initial encounter for closed fracture: Secondary | ICD-10-CM | POA: Diagnosis not present

## 2022-04-21 DIAGNOSIS — D6869 Other thrombophilia: Secondary | ICD-10-CM | POA: Diagnosis not present

## 2022-04-21 LAB — CBC
HCT: 27.4 % — ABNORMAL LOW (ref 36.0–46.0)
Hemoglobin: 9.1 g/dL — ABNORMAL LOW (ref 12.0–15.0)
MCH: 29.4 pg (ref 26.0–34.0)
MCHC: 33.2 g/dL (ref 30.0–36.0)
MCV: 88.7 fL (ref 80.0–100.0)
Platelets: 166 10*3/uL (ref 150–400)
RBC: 3.09 MIL/uL — ABNORMAL LOW (ref 3.87–5.11)
RDW: 19.3 % — ABNORMAL HIGH (ref 11.5–15.5)
WBC: 1.9 10*3/uL — ABNORMAL LOW (ref 4.0–10.5)
nRBC: 0 % (ref 0.0–0.2)

## 2022-04-21 LAB — TYPE AND SCREEN
ABO/RH(D): O POS
Antibody Screen: NEGATIVE
Unit division: 0
Unit division: 0
Unit division: 0

## 2022-04-21 LAB — BPAM RBC
Blood Product Expiration Date: 202307252359
Blood Product Expiration Date: 202307252359
Blood Product Expiration Date: 202307262359
ISSUE DATE / TIME: 202307030734
ISSUE DATE / TIME: 202307031054
ISSUE DATE / TIME: 202307040122
Unit Type and Rh: 5100
Unit Type and Rh: 5100
Unit Type and Rh: 5100

## 2022-04-21 LAB — URINE CULTURE: Culture: >=100000 — AB

## 2022-04-21 LAB — BASIC METABOLIC PANEL
Anion gap: 7 (ref 5–15)
BUN: 23 mg/dL (ref 8–23)
CO2: 27 mmol/L (ref 22–32)
Calcium: 7.5 mg/dL — ABNORMAL LOW (ref 8.9–10.3)
Chloride: 98 mmol/L (ref 98–111)
Creatinine, Ser: 1.06 mg/dL — ABNORMAL HIGH (ref 0.44–1.00)
GFR, Estimated: 53 mL/min — ABNORMAL LOW (ref >60)
Glucose, Bld: 179 mg/dL — ABNORMAL HIGH (ref 70–99)
Potassium: 4.1 mmol/L (ref 3.5–5.1)
Sodium: 132 mmol/L — ABNORMAL LOW (ref 135–145)

## 2022-04-21 MED ORDER — APIXABAN 2.5 MG PO TABS
2.5000 mg | ORAL_TABLET | Freq: Two times a day (BID) | ORAL | Status: DC
  Administered 2022-04-21 – 2022-04-22 (×3): 2.5 mg via ORAL
  Filled 2022-04-21 (×3): qty 1

## 2022-04-21 MED ORDER — ORAL CARE MOUTH RINSE
15.0000 mL | OROMUCOSAL | Status: DC | PRN
Start: 2022-04-21 — End: 2022-04-30

## 2022-04-21 MED ORDER — AMIODARONE HCL 200 MG PO TABS
200.0000 mg | ORAL_TABLET | Freq: Every day | ORAL | Status: DC
  Administered 2022-04-21 – 2022-04-24 (×4): 200 mg via ORAL
  Filled 2022-04-21 (×5): qty 1

## 2022-04-21 MED ORDER — METOPROLOL TARTRATE 12.5 MG HALF TABLET
12.5000 mg | ORAL_TABLET | Freq: Two times a day (BID) | ORAL | Status: DC
Start: 2022-04-21 — End: 2022-04-22
  Administered 2022-04-21: 12.5 mg via ORAL
  Filled 2022-04-21 (×2): qty 1

## 2022-04-21 NOTE — Progress Notes (Signed)
Progress Note   Patient: Carrie [name]:[id number] DOB: 29-May-1941 DOA: 05/10/2022     3 DOS: the patient was seen and examined on 04/21/2022 at 7:45 AM      Brief hospital course: Carrie [name] is a 81 y.o. F with hx follicular lymphoma stage IIIa s/p chemotherapy with pancytopenia, dCHF, cAF on Eliquis, COVID last Feb with chronic cough, NOT on home O2, hypothyroidism, HTN, HLD, stage IIIb CKD who presented with confusion and unwitnessed fall with complaint of left hip pain.   In the ER, patient noted to have hypoxia, new hip fracture.  CTA chest showed no PE but confirmed mild multifocal infiltrates.       7/2: Admitted for hip fracture 7/3: Developed pre-op hypotension due to ABLA requiring 2u PRBCs, stabilised and went to OR in afternoon for ORIF 7/4: Hypotensive again overnight, got 3rd unit PRBCs, started on Levophed briefly, CCM consulted 7/5 transferred out of unit     Assessment and Plan: * Hip fracture ([place]) S/p ORIF 7/3 by Dr. Alvan Dame - Consult Orthopedics, appreciate recommendations - 50% partial weight bearing to left - DVT ppx: Already on Eliquis  Acute blood loss anemia Hgb range has been 8-12 over the last 6 months, recently more 8-10.   Transfused 3 units this hospitalization.  Now stable, high 9s today.  No bleeding. - Resume Eliquis - Trend Hgb - Cryo, platelets if more transfusions   Sepsis ([place]) Presented with tachycardia, leukopenia, encephalopathy, worsening respiratory failure in the setting of worsening pulmonary infiltrates.  At this time, shock is not present  Urine culture growing citrobacter susc to CTX.  RVP positive for rhinovirus - Continue Rocephin  - Pulmonary toilet  CAP (community acquired pneumonia) Agree, more likely is just viral LRTI  UTI (urinary tract infection) See above  Acute metabolic encephalopathy At baseline, patient lives alone, still drives.  Since her last hospitalzation she has been in rehab, and has had  intermittent disorientation, more cognitive processing deficits than normal.  Here, she was somnolent and sluggish on presentation due to sepsis.  THis has improved but she stil has mild delririum.    Delirium precautions:   -Lights and TV off, minimize interruptions at night  -Blinds open and lights on during day  -Glasses/hearing aid with patient  -Frequent reorientation  -PT/OT when able  -Avoid sedation medications/Be[place] list medications    Hypocalcemia Supplemented  Myocardial injury Due to sepsis, hypotension, no angina or signs of ischemia.  Acute on chronic respiratory failure with hypoxia (HCC) Here, she had new infiltrates, has had hypoxia to the mid-80s, respiratory rates in the 30s and requires 4L O2 to maintain O2 sats up from her recent baseline 3L.  At present suspect this is more atypical pneumonia than CHF  Now mostly resolved back to basleine o2 needs  Hypomagnesemia Supplemented and resolved  Hyponatremia Due to hypovolemia, stable - Hold furosemide  Atrial fibrillation, chronic (HCC) - Resume Eliquis at 2.5 for now (weight and age right at cusp of 2.5 dose, and given bleeding, will be cautious) - Resume amio and metop, latter at lower dose   Chronic diastolic CHF (congestive heart failure) (HCC) Appears euvolemic to dry - Hold Lasix    Acquired hypercoagulable state ([place]) Due to Afib - Hold Eliquis for now  Pulmonary hypertension (HCC) - Hold furosemide   Leukopenia Chronic and intermittent, stable  Essential hypertension BP normal - Hold furosemide - Resume amiodarone, metoprolol   Hypokalemia Supplemented and resolved  Stage 3a chronic kidney disease (  [place]) AKI ruled out.  Cr stable around baseline  - Close monitoring renal function given hypotension  Grade 3a follicular lymphoma of lymph nodes of multiple regions ([place]) In remission on rituximab maintenance, last dose Jan 2023  Hypothyroidism TSH 2 months ago normal - Continue  levothyroxine          Subjective: Patient is feeling well, she has some soreness in her right hip, but not enough to need medicine right now.  No palpitations, dyspnea, confusion.  No fever, no respiratory distress.  No bleeding.     Physical Exam: Vitals:   04/21/22 0420 04/21/22 0500 04/21/22 0600 04/21/22 0700  BP:  (!) 143/65 120/64 (!) 137/58  Pulse:  67 (!) 54 (!) 56  Resp:  (!) '24 15 19  '$ Temp: 97.6 F (36.4 C)     TempSrc: Axillary     SpO2:  97% 97% 100%  Weight:      Height:       Elderly adult female, lying in bed, interactive and alert, appropriate interactions RRR, no murmurs, no pitting edema of the lower extremities Respiratory rate normal, lungs clear without rales or wheezes Left hip incision is clean and dry and intact, covered with bandage, no bruising surrounding it no erythema redness surrounding it Attention normal, affect appropriate, judgment insight appear normal, oriented to person, place, time, and situation, face symmetric, moves upper extremities with normal strength and coordination    Data Reviewed: Basic metabolic panel shows sodium 132, no change, creatinine 1.06, no change CBC shows white blood cell still 1.9, hemoglobin 9.1, essentially stable from yesterday      Disposition: Status is: Inpatient         Author: Edwin Dada, MD 04/21/2022 7:53 AM  For on call review [email or web address].

## 2022-04-21 NOTE — Progress Notes (Signed)
Physical Therapy Treatment Patient Details Name: Carrie [name] MRN: [id number] DOB: 20-Oct-1940 Today's Date: 04/21/2022   History of Present Illness Patient is a 81 year old female who presented with confusion and L hip pain after a fall. patient was found to have a comminuted L intertrocanteric fx. patient was transitioned to step down unit on 7/3 with hypotension and acute blood loss anemia. patient underwent IM nail on 7/3 evening. PMH: non-hodgkins lymphoma, a fib s/p cardioversion, HTN, CHF, CKD, chronic cough post covid.    PT Comments    The patient   requires max assist to [facility] to sitting. Denna Haggard used for stnading and trnasfer to recliner. Continue PT for mobility, PWB  left leg.    Recommendations for follow up therapy are one component of a multi-disciplinary discharge planning process, led by the attending physician.  Recommendations may be updated based on patient status, additional functional criteria and insurance authorization.  Follow Up Recommendations  Skilled nursing-short term rehab (<3 hours/day) Can patient physically be transported by private vehicle: No   Assistance Recommended at Discharge Frequent or constant Supervision/Assistance  Patient can return home with the following A lot of help with walking and/or transfers;A lot of help with bathing/dressing/bathroom;Help with stairs or ramp for entrance;Assist for transportation   Equipment Recommendations  None recommended by PT    Recommendations for Other Services       Precautions / Restrictions Precautions Precautions: Fall Precaution Comments: hypotension Restrictions Weight Bearing Restrictions: Yes LLE Weight Bearing: Partial weight bearing LLE Partial Weight Bearing Percentage or Pounds: up to 50%     Mobility  Bed Mobility   Bed Mobility: Supine to Sit     Supine to sit: Max assist     General bed mobility comments: max assist to move legs, use of bed pad to spin and pull patient to  bed edge    Transfers Overall transfer level: Needs assistance   Transfers: Sit to/from Stand, Bed to chair/wheelchair/BSC Sit to Stand: From elevated surface, Mod assist, +2 physical assistance, +2 safety/equipment           General transfer comment: patient able to stand from bed with mod assist at [place], Utilized lift to roll and turn to recliner, stood again in [employer] via Lift Equipment: [employer]  Ambulation/Gait                   Stairs             Wheelchair Mobility    Modified Rankin (Stroke Patients Only)       Balance   Sitting-balance support: Feet supported, Bilateral upper extremity supported Sitting balance-Leahy Scale: Fair Sitting balance - Comments: tends to list to right, ? offset from left hip edema/surgery.   Standing balance support: Reliant on assistive device for balance, Bilateral upper extremity supported Standing balance-Leahy Scale: Poor                              Cognition Arousal/Alertness: Awake/alert Behavior During Therapy: WFL for tasks assessed/performed Overall Cognitive Status: History of cognitive impairments - at baseline                                          Exercises General Exercises - Lower Extremity Ankle Circles/Pumps: AROM, Both, 10 reps Heel Slides: AAROM, Left, 5 reps  Hip ABduction/ADduction: AAROM, Left, 10 reps    General Comments        Pertinent Vitals/Pain Pain Assessment Faces Pain Scale: Hurts little more Pain Location: LLE /hip Pain Descriptors / Indicators: Discomfort Pain Intervention(s): Monitored during session    Home Living                          Prior Function            PT Goals (current goals can now be found in the care plan section) Progress towards PT goals: Progressing toward goals    Frequency    Min 2X/week      PT Plan Current plan remains appropriate    Co-evaluation              AM-PAC PT "6  Clicks" Mobility   Outcome Measure  Help needed turning from your back to your side while in a flat bed without using bedrails?: Total Help needed moving from lying on your back to sitting on the side of a flat bed without using bedrails?: Total Help needed moving to and from a bed to a chair (including a wheelchair)?: Total Help needed standing up from a chair using your arms (e.g., wheelchair or bedside chair)?: Total Help needed to walk in hospital room?: Total Help needed climbing 3-5 steps with a railing? : Total 6 Click Score: 6    End of Session   Activity Tolerance: Patient tolerated treatment well Patient left: in chair;with call bell/phone within reach;with nursing/sitter in room;with chair alarm set Nurse Communication: Mobility status;Need for lift equipment PT Visit Diagnosis: Unsteadiness on feet (R26.81);Pain;Repeated falls (R29.6) Pain - Right/Left: Left Pain - part of body: Hip     Time: 0258-5277 PT Time Calculation (min) (ACUTE ONLY): 25 min  Charges:  $Therapeutic Activity: 23-37 mins                     Tresa Endo PT Acute Rehabilitation Services Office [phone] Weekend [phone]    Claretha Cooper 04/21/2022, 11:28 AM

## 2022-04-22 ENCOUNTER — Encounter (HOSPITAL_COMMUNITY): Payer: Self-pay | Admitting: Orthopedic Surgery

## 2022-04-22 ENCOUNTER — Inpatient Hospital Stay (HOSPITAL_COMMUNITY): Payer: No Typology Code available for payment source

## 2022-04-22 DIAGNOSIS — D62 Acute posthemorrhagic anemia: Secondary | ICD-10-CM | POA: Diagnosis not present

## 2022-04-22 DIAGNOSIS — D6869 Other thrombophilia: Secondary | ICD-10-CM | POA: Diagnosis not present

## 2022-04-22 DIAGNOSIS — G9341 Metabolic encephalopathy: Secondary | ICD-10-CM | POA: Diagnosis not present

## 2022-04-22 DIAGNOSIS — R1310 Dysphagia, unspecified: Secondary | ICD-10-CM

## 2022-04-22 DIAGNOSIS — S72002A Fracture of unspecified part of neck of left femur, initial encounter for closed fracture: Secondary | ICD-10-CM | POA: Diagnosis not present

## 2022-04-22 LAB — BASIC METABOLIC PANEL
Anion gap: 9 (ref 5–15)
BUN: 16 mg/dL (ref 8–23)
CO2: 26 mmol/L (ref 22–32)
Calcium: 7.6 mg/dL — ABNORMAL LOW (ref 8.9–10.3)
Chloride: 97 mmol/L — ABNORMAL LOW (ref 98–111)
Creatinine, Ser: 0.92 mg/dL (ref 0.44–1.00)
GFR, Estimated: >60 mL/min (ref >60)
Glucose, Bld: 162 mg/dL — ABNORMAL HIGH (ref 70–99)
Potassium: 3.6 mmol/L (ref 3.5–5.1)
Sodium: 132 mmol/L — ABNORMAL LOW (ref 135–145)

## 2022-04-22 LAB — CBC
HCT: 28.3 % — ABNORMAL LOW (ref 36.0–46.0)
Hemoglobin: 9.3 g/dL — ABNORMAL LOW (ref 12.0–15.0)
MCH: 29.6 pg (ref 26.0–34.0)
MCHC: 32.9 g/dL (ref 30.0–36.0)
MCV: 90.1 fL (ref 80.0–100.0)
Platelets: 160 10*3/uL (ref 150–400)
RBC: 3.14 MIL/uL — ABNORMAL LOW (ref 3.87–5.11)
RDW: 19.7 % — ABNORMAL HIGH (ref 11.5–15.5)
WBC: 1.5 10*3/uL — ABNORMAL LOW (ref 4.0–10.5)
nRBC: 0 % (ref 0.0–0.2)

## 2022-04-22 MED ORDER — METOPROLOL TARTRATE 25 MG PO TABS
25.0000 mg | ORAL_TABLET | Freq: Two times a day (BID) | ORAL | Status: DC
  Administered 2022-04-22 – 2022-04-25 (×7): 25 mg via ORAL
  Filled 2022-04-22 (×7): qty 1

## 2022-04-22 MED ORDER — POTASSIUM CHLORIDE IN NACL 20-0.9 MEQ/L-% IV SOLN
INTRAVENOUS | Status: DC
  Filled 2022-04-22 (×6): qty 1000

## 2022-04-22 NOTE — TOC Progression Note (Addendum)
Transition of Care [facility]) - Progression Note    Patient Details  Name: Carrie [name] MRN: [id number] Date of Birth: 1941/04/10  Transition of Care [facility]) CM/SW Contact  Keshawn Sundberg, Marjie Skiff, RN Phone Number: 04/22/2022, 3:26 PM  Clinical Narrative:     Spoke with daughter Carrie [name] to follow up for dc planning. Informed her that if she did want pt to go with a SNF in the [place] area there would be a private pay cost for transportation through a private transport company and it could potentially be several thousand dollars. [name] states she understands. [name] to call around to facilities in [place] to see if they accept [employer] as pt insurance is contracted with very few facilities. She called the following facilities: [facility] network with [facility]- left [facility]- left VM [facility] in [place] network with [employer] (Admissions person Christianne Borrow P: [phone] F: [phone])   Pt not close to being medically ready for dc. TOC will continue to follow along and assist.  Expected Discharge Plan: [place] Barriers to Discharge: Continued Medical Work up  Expected Discharge Plan and Services Expected Discharge Plan: [place]   Discharge Planning Services: CM Consult Post Acute Care Choice: [place] Living arrangements for the past 2 months: Single Family Home                       Social Determinants of Health (SDOH) Interventions    Readmission Risk Interventions    02/26/2022   10:30 AM 02/22/2022    1:48 PM  Readmission Risk Prevention Plan  Transportation Screening Complete Complete  Medication Review Press photographer) Complete Complete  PCP or Specialist appointment within 3-5 days of discharge Complete Complete  HRI or [place] Complete Complete  SW Recovery Care/Counseling Consult Complete Complete  Palliative Care Screening Not Applicable  Not [place] Not Applicable Not Applicable

## 2022-04-22 NOTE — Evaluation (Signed)
Clinical/Bedside Swallow Evaluation Patient Details  Name: MELANIA KIRKS MRN: [id number] Date of Birth: 1940-12-04  Today's Date: 04/22/2022 Time: SLP Start Time (ACUTE ONLY): 0747 SLP Stop Time (ACUTE ONLY): 0825 SLP Time Calculation (min) (ACUTE ONLY): 38 min  Past Medical History:  Past Medical History:  Diagnosis Date   A-fib ([place])    Anemia    pt denies    Atrial fibrillation with rapid ventricular response ([place]) 06/25/2015   Carpal tunnel syndrome, bilateral    pt denies    Cervical spondylosis without myelopathy 10/25/2013   Dental bridge present    DJD (degenerative joint disease)    Dyspnea    sometimes hx of cancer non hodgkins lymphoma    Dysrhythmia    WENT INTO A FIB IN 2017    Elevated troponin 12/17/5174   Follicular lymphoma grade 3a ([place]) 02/03/2015   hx of nonhodgkins lymphoma x 3    Headache    sometimes a headache    History of echocardiogram    Echo 12/16: EF 60-65%, no RWMA, severe LAE   History of nuclear stress test    Myoview 1/17: EF 55%, Normal study. No ischemia or scar.   Hypertension    Hypothyroid    Nausea without vomiting 06/20/2015   Stage III chronic kidney disease ([place]) 07/01/2015   Thrush of mouth and esophagus ([place]) 06/25/2015   Past Surgical History:  Past Surgical History:  Procedure Laterality Date   ABDOMINAL HYSTERECTOMY     APPENDECTOMY     BACK SURGERY     X5-lumbar-fusion   CARDIOVERSION N/A 02/17/2022   Procedure: CARDIOVERSION;  Surgeon: Berniece Salines, DO;  Location: [facility];  Service: Cardiovascular;  Laterality: N/A;   COLONOSCOPY     DILATION AND CURETTAGE OF UTERUS     LYMPH NODE BIOPSY Right 01/21/2015   Procedure: RIGHT GROIN LYMPH NODE BIOPSY;  Surgeon: Erroll Luna, MD;  Location: [facility];  Service: General;  Laterality: Right;   MASS EXCISION Left 08/29/2018   Procedure: EXCISION LEFT BACK  MASS;  Surgeon: Erroll Luna, MD;  Location: [place];  Service: General;  Laterality: Left;   Ovarian cyst  resection     patelar tendon transplants     Left/right   PORTACATH PLACEMENT Right 02/13/2015   Procedure: INSERTION PORT-A-CATH WITH ULTRASOUND;  Surgeon: Erroll Luna, MD;  Location: [place];  Service: General;  Laterality: Right;   PORTACATH PLACEMENT N/A 08/29/2018   Procedure: INSERTION PORT-A-CATH WITH ULTRA SOUND ERAS PATHWAY;  Surgeon: Erroll Luna, MD;  Location: [facility];  Service: General;  Laterality: N/A;   REVERSE SHOULDER ARTHROPLASTY Left 03/02/2021   Procedure: REVERSE SHOULDER ARTHROPLASTY;  Surgeon: Netta Cedars, MD;  Location: [facility];  Service: Orthopedics;  Laterality: Left;   RIGHT/LEFT HEART CATH AND CORONARY ANGIOGRAPHY N/A 01/15/2022   Procedure: RIGHT/LEFT HEART CATH AND CORONARY ANGIOGRAPHY;  Surgeon: [name], MD;  Location: [place] [facility];  Service: Cardiovascular;  Laterality: N/A;   TOTAL KNEE ARTHROPLASTY  2011   Right   TUBAL LIGATION     HPI:  81 yo female adm to [facility] after fall, She is s/p left hip ORIF.  PMH + for non-Hodgkins lymphoma, COVID 19, chronic small vessel disease per CT brain - for MRI ? today. Pt also now with rhinovirus and is on droplet precautions.  Swallow evaluation ordered due to pt having coughing with intake and reporting dysphagia.   Pt attributes some of her recent weight loss to dysphagia. CT  chest  showed Infiltrate or atelectasis is seen posterior lower  lobes bilaterally. Multifocal ill-defined areas of airspace opacity  are also seen in the upper lung zones bilaterally no evidence of  pleural effusion.    Assessment / Plan / Recommendation  Clinical Impression  Pt presenting with clinical indications concerning for multifactorial dysphagia including pharyngo-cervical esophageal dysphagia. She demonstrates oral holding across boluses and suspected discoordinated swallow, ? UES dysfunction.  Overt coughing and expectoration of secretions noted during all intake , but much more pronounced with thin liquids. Chin tuck posture  did not consistently improve symptoms. Cough was not observed at baseline.  Pt endorses coughing up whole pills at times at home - no accompanied by frothy secretions and points to near vallecular region when asked to locate area of stasis.  Pt will undergo MBS to allow instrumental evaluation of swallow ability.  Fortunately pt is able to cough and expectorate but given decreased mobility s/p hip fx, her ramifications from dysphagia and aspiration are exacerbated.  She reports sudden onset of dysphagia approx 2 months ago and states this has contributed to her weight loss.  RN, pt and xray made aware. SLP Visit Diagnosis: Dysphagia, oropharyngeal phase (R13.12);Dysphagia, pharyngoesophageal phase (R13.14);Dysphagia, unspecified (R13.10)    Aspiration Risk  Moderate aspiration risk;Risk for inadequate nutrition/hydration    Diet Recommendation Nectar-thick liquid (thin water ok)   Liquid Administration via: Cup;Spoon;Straw Medication Administration: Crushed with puree Supervision: Patient able to self feed Compensations: Slow rate;Small sips/bites Postural Changes: Remain upright for at least 30 minutes after po intake;Seated upright at 90 degrees    Other  Recommendations Oral Care Recommendations: Oral care BID    Recommendations for follow up therapy are one component of a multi-disciplinary discharge planning process, led by the attending physician.  Recommendations may be updated based on patient status, additional functional criteria and insurance authorization.  Follow up Recommendations    TBD    Assistance Recommended at Discharge  Full  Functional Status Assessment Patient has had a recent decline in their functional status and/or demonstrates limited ability to make significant improvements in function in a reasonable and predictable amount of time  Frequency and Duration min 2x/week  2 weeks       Prognosis Prognosis for Safe Diet Advancement: Fair Barriers to Reach Goals:  Other (Comment);Time post onset      Swallow Study   General Date of Onset: 04/22/22 HPI: 81 yo female adm to [facility] after fall, She is s/p left hip ORIF.  PMH + for non-Hodgkins lymphoma, COVID 19, chronic small vessel disease per CT brain - for MRI ? today. Pt also now with rhinovirus and is on droplet precautions.  Swallow evaluation ordered due to pt having coughing with intake and reporting dysphagia.   Pt attributes some of her recent weight loss to dysphagia. CT chest  showed Infiltrate or atelectasis is seen posterior lower  lobes bilaterally. Multifocal ill-defined areas of airspace opacity  are also seen in the upper lung zones bilaterally no evidence of  pleural effusion. Type of Study: Bedside Swallow Evaluation Diet Prior to this Study: Regular;Thin liquids Temperature Spikes Noted: No Respiratory Status: Room air History of Recent Intubation: Yes (for surgery 2 days ago, pt reported odynophagia yesterday but improvement today) Behavior/Cognition: Alert;Other (Comment);Requires cueing (delayed responses) Oral Cavity Assessment: Within Functional Limits Oral Care Completed by SLP: No Oral Cavity - Dentition: Adequate natural dentition Vision: Functional for self-feeding Self-Feeding Abilities: Able to feed self Patient Positioning: Upright in bed Baseline  Vocal Quality: Low vocal intensity Volitional Cough: Strong Volitional Swallow: Unable to elicit    Oral/Motor/Sensory Function Overall Oral Motor/Sensory Function: Within functional limits   Ice Chips Ice chips: Not tested   Thin Liquid Thin Liquid: Impaired Oral Phase Functional Implications: Oral holding Pharyngeal  Phase Impairments: Cough - Immediate Other Comments: immediate coughing after swallowing water and applejuice x4/6 swallows; chin tuck posture not preventative as it continued to allow delayed cough    Nectar Thick Nectar Thick Liquid: Impaired Presentation: Cup;Self Fed Oral Phase Impairments: Other  (comment) Oral phase functional implications: Oral holding Pharyngeal Phase Impairments: Cough - Delayed   Honey Thick Honey Thick Liquid: Not tested   Puree Puree: Impaired Presentation: Self Fed;Spoon Pharyngeal Phase Impairments: Cough - Delayed Other Comments: cough with expectoration   Solid     Solid: Impaired Oral Phase Functional Implications: Oral holding Pharyngeal Phase Impairments: Cough - Delayed      [name] 04/22/2022,9:19 AM  Kathleen Lime, MS [place] Office [phone] Pager [phone]

## 2022-04-22 NOTE — Progress Notes (Signed)
Progress Note   Patient: Carrie [name] IWP:[id number] DOB: 03-16-1941 DOA: 05/13/2022     4 DOS: the patient was seen and examined on 04/22/2022 at 8:15 AM      Brief hospital course: Carrie [name] is a 81 y.o. F with hx follicular lymphoma stage IIIa s/p chemotherapy with pancytopenia, dCHF, cAF on Eliquis, COVID last Feb with chronic cough, NOT on home O2, hypothyroidism, HTN, HLD, stage IIIb CKD who presented with confusion and unwitnessed fall with complaint of left hip pain.   In the ER, patient noted to have hypoxia, new hip fracture.  CTA chest showed no PE but confirmed mild multifocal infiltrates.       7/2: Admitted for hip fracture 7/3: Developed pre-op hypotension due to ABLA requiring 2u PRBCs, stabilised and went to OR in afternoon for ORIF 7/4: Hypotensive again overnight, got 3rd unit PRBCs, started on Levophed briefly, CCM consulted 7/5 transferred out of unit     Assessment and Plan: * Hip fracture ([place]) S/p ORIF 7/3 by Dr. Alvan Dame - Consult Orthopedics, appreciate recommendations - 50% partial weight bearing to left - DVT ppx: Already on Eliquis  Acute blood loss anemia Hgb range has been 8-12 over the last 6 months, recently more 8-10.   Transfused 3 units this hospitalization.   No clinical bleeding, hemoglobin stable since resuming Eliquis yesterday - Continue Eliquis - Trend Hgb - Cryo, platelets if more transfusions   Sepsis ([place]) Presented with tachycardia, leukopenia, encephalopathy, worsening respiratory failure in the setting of worsening pulmonary infiltrates.  At this time, shock is not present  Urine culture growing citrobacter susc to CTX.  RVP positive for rhinovirus - Continue Rocephin, day 5 - Pulmonary toilet  CAP (community acquired pneumonia) Agree, more likely is just viral LRTI  UTI (urinary tract infection) See above  Acute metabolic encephalopathy At baseline, patient lives alone, still drives.  Since her last hospitalzation she  has been in rehab, and has had intermittent disorientation, more cognitive processing deficits than normal.  Here, she was somnolent and sluggish on presentation due to sepsis.  This is mostly resolved.  Family have asked for MRI of the brain to evaluate for delirium.  I also checked her ear canals which were normal today.    -Delirium precautions -MRI brain  Hypocalcemia Supplemented  Myocardial injury Due to sepsis, hypotension, no angina or signs of ischemia.  Acute on chronic respiratory failure with hypoxia (HCC) Here, she had new infiltrates, has had hypoxia to the mid-80s, respiratory rates in the 30s and requires 4L O2 to maintain O2 sats up from her recent baseline 3L.  At present suspect this is more atypical pneumonia than CHF  Now mostly resolved back to basleine o2 needs  Hypomagnesemia Supplemented and resolved  Hyponatremia Due to hypovolemia, stable - Hold furosemide  Atrial fibrillation, chronic (HCC) - Continue Eliquis at 2.5 for now (weight and age right at cusp of 2.5 dose, and given bleeding, will be cautious) - Continue amiodarone and metoprolol  Chronic diastolic CHF (congestive heart failure) (HCC) Appears euvolemic to dry - Hold Lasix    Acquired hypercoagulable state ([place]) Due to Afib    Pulmonary hypertension (HCC) - Hold furosemide for now  Leukopenia Chronic and intermittent, stable  Essential hypertension BP low normal - Hold furosemide - Continue amiodarone, metoprolol   Hypokalemia Supplemented and resolved  Stage 3a chronic kidney disease ([place]) AKI ruled out. Creatinine stable  Grade 3a follicular lymphoma of lymph nodes of multiple regions ([place]) In remission  on rituximab maintenance, last dose Jan 2023  Hypothyroidism TSH 2 months ago normal - Continue levothyroxine          Subjective: Patient has less pain in her left hip today, she has no confusion, her hearing is slightly better, she had no fever, cough,  dyspnea, palpitations, chest pain.     Physical Exam: Vitals:   04/22/22 0600 04/22/22 0700 04/22/22 0735 04/22/22 0742  BP:   (!) 148/73   Pulse: 73 72 78   Resp: (!) 24 (!) 22 (!) 21   Temp:    98.4 F (36.9 C)  TempSrc:    Oral  SpO2: 93% 95% 96%   Weight:      Height:       Elderly adult female, lying in bed, no acute distress, nasal cannula in place, interactive RRR, no murmurs, no peripheral edema Respiratory rate normal, lungs clear without rales or wheezes Abdomen soft without tenderness palpation or guarding, no ascites or distention The bilateral tympanic membranes are normal Attention normal, affect normal, judgment and insight appear normal, she appears a little tired, but she moves all her upper extremities with normal strength and coordination, speech is fluent, face is symmetric    Data Reviewed: Patient metabolic panel shows persistent hyponatremia, no change from previous, creatinine down to 0.9 Complete blood count shows mild leukopenia, no change from previous, hemoglobin stable at 9.3      Disposition: Status is: Inpatient         Author: Edwin Dada, MD 04/22/2022 9:09 AM  For on call review [email or web address].

## 2022-04-22 NOTE — Progress Notes (Signed)
MBS completed, Full report to follow.  Pt presents with mild oral and severe cervical esophageal dysphagia.  Prolonged oral transiting noted across all boluses tested * puree bolus x1, thin x3, and nectar x2.  Primary dysphagia is cervical esophageal with very poor UES opening resulting in NO clearnce of pudding bolus through UES and poor clearance of larger boluses of liquids *via cup.  Pt aspirated liquids via cup given in effort to clear pyriform sinus retention through cervical esophagus- as pyriform sinus retention spilled into open airway.  Reflexive cough cleared pudding/liquids from pharynx/larynx but not fully cleared aspirates.  Spoke to MD Dr Loleta Books on the phone re: concerns - pt's elevated aspiration and malnutrition risk with current level of dysphagia.  Recommend tsps of thin/nectar liquids only and GI consult.  Esophagram unable to be completed due to pt's aspiration.   Kathleen Lime, MS [facility] SLP Acute Rehab Services Office [phone] Pager [phone]

## 2022-04-22 NOTE — Progress Notes (Addendum)
Modified Barium Swallow Progress Note  Patient Details  Name: Carrie [name] MRN: [id number] Date of Birth: March 29, 1941  Today's Date: 04/22/2022  Modified Barium Swallow completed.  Full report located under Chart Review in the Imaging Section.  Brief recommendations include the following:  Clinical Impression  Patient presents with mild oral and severe cervical esophageal dysphagia, ? acute on chronic (last 2 months per pt) in nature.  Prolonged oral transiting present likely due to current mentation - or possibly secondary.  UES opening was grossly impaired resulting in no clearance of pudding through cervical esophagus- with pt sensation.  She produced reflexive cough and orally expectorated large amount of pharyngeal retention.  Thin and nectar via cup was aspirated due to pharyngeal retention from poor UES opening.  SLP tested thin, nectar and pudding boluses only.  Pharyngeal motility, tongue base retraction and laryngeal elevation/closure are all adequate.  Pt's current dysphagia does not allow adequate nutrition and airway protection is concerning especially given acute medical illness with hip FX and impaired mobility.  Patient reports sudden onset of dysphagia several months ago and attributes some of her reported weight loss to her dysphagia.  Recommend patient consume tsps of thin and nectar liquids only pending GI referral.  Hopeful that GI can intervene to improve patient's functional swallowing significantly.   Note MRI of brain is negative for an acute event per chart review.  Educated pt, RN and spoke to MD following exam.  SLP will follow up to determine if can be of any furhter assistance to patient regarding her swallow function.   Swallow Evaluation Recommendations       SLP Diet Recommendations: Thin liquid;Nectar thick liquid (TSPS of thin and nectar ONLY)       Medication Administration: Other (Comment) (liquid format or crushed with tsp amounts of liquids if not  contraindicated)   Supervision: Patient able to self feed   Compensations: Slow rate;Small sips/bites       Oral Care Recommendations: Oral care QID    SLP messaged GI PA requesting she and MD examine specific flouro loops of the MBS study that focuses on UES function/dysfunction.  Puree bolus #19 (image 170 .Marland Kitchen...) and #20 showing lack of UES opening.  PA advised they would examine the study.  Appreciate GI MD, PA!      Kathleen Lime, MS [place] Office [phone] Pager [phone]    [name] 04/22/2022,3:22 PM

## 2022-04-22 NOTE — Consult Note (Signed)
Referring Provider: [facility] Primary Care Physician:  [name] Primary Gastroenterologist:  [facility]  Reason for Consultation: Dysphagia, weight loss  HPI: Carrie [name] is a 81 y.o. female with medical history significant of PAF s/p DCCV 5/3 with ERAF, chronic HFpEF, Follicular lymphoma stage IIIa s/p chemotherapy with pancytopenia, hypothyroidism, HTN, HLD, stage IIIb CKD, and chronic cough since recovering from covid-19 infection early 2023 on chronic O2 of 3L at NH,  who presents to ed from NH s/p unwitnessed fall with complaint of left hip pain.  During admission patient notes she has difficulty swallowing most days for the last several months.  Notes she will have feelings of food getting hung in her throat after swallowing 1-2 times per week.  She has dysphagia to liquids, solids, pills.  Denies pain with swallowing.  Denies nausea, vomiting, regurgitation.  She also reports 22 pound weight loss in the last 6 months due to decreased oral intake.  Colonoscopy 11/16/2010 with Dr. Lance Bosch examined colon normal, recall 5 years for surveillance for personal history of colonic polyps. She reports she had previous EGD about 10 years ago, no reports on file.  Patient has intermittent episodes of confusion during this admission.  When I evaluated the patient she was oriented to self, and time.  She does not seem aware of location unable to name president.  Per nursing staff shortly prior to my evaluation she mentioned that year was 75.    Past Medical History:  Diagnosis Date   A-fib [facility])    Anemia    pt denies    Atrial fibrillation with rapid ventricular response ([place]) 06/25/2015   Carpal tunnel syndrome, bilateral    pt denies    Cervical spondylosis without myelopathy 10/25/2013   Dental bridge present    DJD (degenerative joint disease)    Dyspnea    sometimes hx of cancer non hodgkins lymphoma    Dysrhythmia    WENT INTO A FIB IN 2017    Elevated troponin 0/12/129    Follicular lymphoma grade 3a ([place]) 02/03/2015   hx of nonhodgkins lymphoma x 3    Headache    sometimes a headache    History of echocardiogram    Echo 12/16: EF 60-65%, no RWMA, severe LAE   History of nuclear stress test    Myoview 1/17: EF 55%, Normal study. No ischemia or scar.   Hypertension    Hypothyroid    Nausea without vomiting 06/20/2015   Stage III chronic kidney disease ([place]) 07/01/2015   Thrush of mouth and esophagus ([place]) 06/25/2015    Past Surgical History:  Procedure Laterality Date   ABDOMINAL HYSTERECTOMY     APPENDECTOMY     BACK SURGERY     X5-lumbar-fusion   CARDIOVERSION N/A 02/17/2022   Procedure: CARDIOVERSION;  Surgeon: Berniece Salines, DO;  Location: [facility];  Service: Cardiovascular;  Laterality: N/A;   COLONOSCOPY     DILATION AND CURETTAGE OF UTERUS     INTRAMEDULLARY (IM) NAIL INTERTROCHANTERIC Left 04/22/2022   Procedure: INTRAMEDULLARY (IM) NAIL INTERTROCHANTRIC;  Surgeon: Paralee Cancel, MD;  Location: [facility];  Service: Orthopedics;  Laterality: Left;  Biomet short nail, [place]   LYMPH NODE BIOPSY Right 01/21/2015   Procedure: RIGHT GROIN LYMPH NODE BIOPSY;  Surgeon: Erroll Luna, MD;  Location: [facility];  Service: General;  Laterality: Right;   MASS EXCISION Left 08/29/2018   Procedure: EXCISION LEFT BACK  MASS;  Surgeon: Erroll Luna, MD;  Location: [facility];  Service: General;  Laterality: Left;   Ovarian cyst resection     patelar tendon transplants     Left/right   PORTACATH PLACEMENT Right 02/13/2015   Procedure: INSERTION PORT-A-CATH WITH ULTRASOUND;  Surgeon: Erroll Luna, MD;  Location: [facility];  Service: General;  Laterality: Right;   PORTACATH PLACEMENT N/A 08/29/2018   Procedure: INSERTION PORT-A-CATH WITH ULTRA SOUND ERAS PATHWAY;  Surgeon: Erroll Luna, MD;  Location: [place];  Service: General;  Laterality: N/A;   REVERSE SHOULDER ARTHROPLASTY Left 03/02/2021   Procedure: REVERSE SHOULDER ARTHROPLASTY;  Surgeon: Netta Cedars, MD;  Location: [facility];  Service: Orthopedics;  Laterality: Left;   RIGHT/LEFT HEART CATH AND CORONARY ANGIOGRAPHY N/A 01/15/2022   Procedure: RIGHT/LEFT HEART CATH AND CORONARY ANGIOGRAPHY;  Surgeon: [name], MD;  Location: [facility];  Service: Cardiovascular;  Laterality: N/A;   TOTAL KNEE ARTHROPLASTY  2011   Right   TUBAL LIGATION      Prior to Admission medications   Medication Sig Start Date End Date Taking? Authorizing Provider  acetaminophen (TYLENOL) 500 MG tablet Take 500 mg by mouth every 6 (six) hours as needed for mild pain or headache.   Yes [provider]  amiodarone (PACERONE) 200 MG tablet Take 1 tab (200 mg total) twice daily for 1 week and then reduce to 1 tablet (200 mg total) once daily. Patient taking differently: Take 200 mg by mouth daily. 02/18/22  Yes Hongalgi, Lenis Dickinson, MD  apixaban (ELIQUIS) 5 MG TABS tablet Take 5 mg by mouth 2 (two) times daily.    Yes [provider]  Artificial Saliva (BIOTENE DRY MOUTH) LOZG Use as directed 1 lozenge in the mouth or throat every 4 (four) hours as needed (for dry mouth).   Yes [provider]  furosemide (LASIX) 40 MG tablet Take 1 tablet (40 mg total) by mouth daily. 03/04/22  Yes Little Ishikawa, MD  ipratropium-albuterol (DUONEB) 0.5-2.5 (3) MG/3ML SOLN Take 3 mLs by nebulization every 6 (six) hours as needed (for wheezing or shortness of breath).   Yes [provider]  levothyroxine (SYNTHROID) 88 MCG tablet Take 1 tablet (88 mcg total) by mouth daily before breakfast. 01/24/22  Yes Lavina Hamman, MD  Lidocaine-Prilocaine, Bulk, 2-2 % CREA Apply 1 application  topically daily as needed (:"for skin condition").   Yes [provider]  metoprolol tartrate (LOPRESSOR) 50 MG tablet Take 1 tablet (50 mg total) by mouth 2 (two) times daily. 03/03/22  Yes Little Ishikawa, MD  OXYGEN Inhale 2 L/min into the lungs as needed (for shortness of breath).   Yes  [provider]  polyvinyl alcohol (LIQUIFILM TEARS) 1.4 % ophthalmic solution Place 1 drop into both eyes every 8 (eight) hours as needed for dry eyes.   Yes [provider]  senna (SENOKOT) 8.6 MG TABS tablet Take 2 tablets by mouth daily as needed for mild constipation.   Yes [provider]  traZODone (DESYREL) 50 MG tablet Take 1 tablet (50 mg total) by mouth at bedtime. Patient taking differently: Take 50 mg by mouth every evening. 03/03/22  Yes Little Ishikawa, MD  lidocaine-prilocaine (EMLA) cream Apply 1 application topically daily as needed. Patient not taking: Reported on  04/07/21   [name], MD    Scheduled Meds:  amiodarone  200 mg Oral Daily   Chlorhexidine Gluconate Cloth  6 each Topical Daily   docusate sodium  100 mg Oral BID   levothyroxine  88  mcg Oral Q0600   metoprolol tartrate  25 mg Oral BID   mouth rinse  15 mL Mouth Rinse 4 times per day   Continuous Infusions:  sodium chloride Stopped (04/27/2022 2321)   sodium chloride 10 mL/hr at 04/22/22 0600   0.9 % NaCl with KCl 20 mEq / L     azithromycin Stopped (04/21/22 2249)   cefTRIAXone (ROCEPHIN)  IV Stopped (04/22/22 0035)   methocarbamol (ROBAXIN) IV     PRN Meds:.sodium chloride, sodium chloride, acetaminophen, bisacodyl, guaiFENesin-dextromethorphan, HYDROcodone-acetaminophen, ipratropium-albuterol, menthol-cetylpyridinium **OR** phenol, methocarbamol **OR** methocarbamol (ROBAXIN) IV, metoCLOPramide **OR** metoCLOPramide (REGLAN) injection, morphine injection, ondansetron **OR** ondansetron (ZOFRAN) IV, mouth rinse, mouth rinse, polyethylene glycol, sodium chloride flush  Allergies as of 05/04/2022 - Review Complete 05/10/2022  Allergen Reaction Noted   Amiodarone Other (See Comments) 06/13/2019    Family History  Problem Relation Age of Onset   Cancer Mother        Breast, lung NHL   Cancer Sister        Multiple myeloma    Social History   Socioeconomic  History   Marital status: Widowed    Spouse name: Not on file   Number of children: 2   Years of education: hs   Highest education level: Not on file  Occupational History   Occupation: Retired  Tobacco Use   Smoking status: Never   Smokeless tobacco: Never  Vaping Use   Vaping Use: Never used  Substance and Sexual Activity   Alcohol use: Not Currently   Drug use: No   Sexual activity: Not on file  Other Topics Concern   Not on file  Social History Narrative   Lives alone.  Has a walker for home use.   Social Determinants of Health   Financial Resource Strain: Not on file  Food Insecurity: Not on file  Transportation Needs: Not on file  Physical Activity: Not on file  Stress: Not on file  Social Connections: Not on file  Intimate Partner Violence: Not on file    Review of Systems: Review of Systems  Constitutional:  Positive for weight loss. Negative for chills, fever and malaise/fatigue.  HENT:  Negative for hearing loss and tinnitus.   Eyes:  Negative for blurred vision and double vision.  Respiratory:  Negative for cough and hemoptysis.   Cardiovascular:  Negative for chest pain and palpitations.  Gastrointestinal:  Negative for abdominal pain, blood in stool, constipation, diarrhea, heartburn, melena, nausea and vomiting.  Genitourinary:  Negative for dysuria and urgency.  Musculoskeletal:  Negative for myalgias and neck pain.  Skin:  Negative for itching and rash.  Neurological:  Positive for weakness. Negative for dizziness and headaches.  Endo/Heme/Allergies:  Negative for environmental allergies. Does not bruise/bleed easily.  Psychiatric/Behavioral:  Negative for depression, substance abuse and suicidal ideas.      Physical Exam:Physical Exam Constitutional:      General: She is not in acute distress.    Appearance: Normal appearance. She is normal weight.  HENT:     Head: Normocephalic and atraumatic.     Right Ear: External ear normal.     Left Ear:  External ear normal.     Nose: Nose normal.     Mouth/Throat:     Mouth: Mucous membranes are moist.  Eyes:     Pupils: Pupils are equal, round, and reactive to light.  Cardiovascular:     Rate and Rhythm: Normal rate and regular rhythm.     Pulses: Normal pulses.  Heart sounds: Normal heart sounds.  Pulmonary:     Effort: Pulmonary effort is normal.     Breath sounds: Normal breath sounds.  Abdominal:     General: Abdomen is flat. Bowel sounds are normal. There is no distension.     Palpations: Abdomen is soft. There is no mass.     Tenderness: There is no abdominal tenderness. There is no guarding or rebound.     Hernia: No hernia is present.  Musculoskeletal:        General: No swelling. Normal range of motion.     Cervical back: Normal range of motion and neck supple.  Skin:    General: Skin is warm and dry.     Coloration: Skin is not pale.  Neurological:     General: No focal deficit present.     Mental Status: She is alert and oriented to person, place, and time. Mental status is at baseline.  Psychiatric:        Mood and Affect: Mood normal.        Behavior: Behavior normal.    Vital signs: Vitals:   04/22/22 0742 04/22/22 1045  BP:  131/74  Pulse:  63  Resp:  (!) 23  Temp: 98.4 F (36.9 C) 98.4 F (36.9 C)  SpO2:  96%   Last BM Date : 04/17/2022    GI:  Lab Results: Recent Labs    04/20/22 0634 04/21/22 0455 04/22/22 0626  WBC 2.3* 1.9* 1.5*  HGB 10.0* 9.1* 9.3*  HCT 29.8* 27.4* 28.3*  PLT 181 166 160   BMET Recent Labs    04/20/22 0634 04/21/22 0455 04/22/22 0626  NA 132* 132* 132*  K 3.8 4.1 3.6  CL 96* 98 97*  CO2 '25 27 26  ' GLUCOSE 247* 179* 162*  BUN 24* 23 16  CREATININE 1.18* 1.06* 0.92  CALCIUM 7.1* 7.5* 7.6*   LFT No results for input(s): "PROT", "ALBUMIN", "AST", "ALT", "ALKPHOS", "BILITOT", "BILIDIR", "IBILI" in the last 72 hours. PT/INR Recent Labs    04/20/22 0634  LABPROT 18.9*  INR 1.6*      Studies/Results: MR BRAIN WO CONTRAST  Result Date: 04/22/2022 CLINICAL DATA:  Neuro deficit. New onset confusion after hip replacement EXAM: MRI HEAD WITHOUT CONTRAST TECHNIQUE: Multiplanar, multiecho pulse sequences of the brain and surrounding structures were obtained without intravenous contrast. COMPARISON:  Head CT from 4 days ago FINDINGS: Brain: No acute infarction, hemorrhage, hydrocephalus, extra-axial collection or mass lesion. Extensive chronic small vessel ischemia in the supratentorial white matter with confluent gliosis and scattered deep white matter lacunes. Moderate cerebral volume loss. Few scattered remote microhemorrhages without specific pattern. Vascular: Normal flow voids. Skull and upper cervical spine: No focal marrow lesion. Cervical spine degeneration with C3-4 anterolisthesis. Sinuses/Orbits: Patchy sinus opacification with bilateral sphenoid sinus and right mastoid fluid levels. Other than mucosal thickening at the floor the left maxillary sinus, this opacification is new. Other: Intermittent motion artifact. IMPRESSION: 1. No acute or reversible finding. 2. Brain atrophy and extensive chronic small vessel ischemia. 3. Sinus and right mastoid opacification since admission head CT. Electronically Signed   By: Jorje Guild M.D.   On: 04/22/2022 11:54    Impression: Dysphagia Aspiration  Intermittent dysphagia to solids, liquids, pills for several months.  No previous EGD on file. Aspiration noted on modified barium swallow. Speech therapy recommended teaspoons of thin/nectar liquids only and GI consult. Unable to proceed with esophagram due to patient's aspiration.  Patient with significant weight loss over the  last 6 months due to decreased oral intake.  We will need to evaluate further with EGD since unable to proceed with barium esophagram.   Plan: Continue diet per speech language pathology recommendations. Plan for EGD w/ possible dilation 7/8. I  thoroughly discussed the procedures to include nature, alternatives, benefits, and risks including but not limited to bleeding, perforation, infection, anesthesia/cardiac and pulmonary complications. Patient provides understanding and gave verbal consent to proceed. NPO at midnight 7/8 Continue anti-emetics and supportive care as needed. [facility] will follow.     LOS: 4 days   Charlott Rakes  PA-C 04/22/2022, 1:28 PM  Contact #  [phone]

## 2022-04-22 NOTE — H&P (View-Only) (Signed)
Referring Provider: [facility] Primary Care Physician:  [facility], [name] Primary Gastroenterologist:  [facility]  Reason for Consultation: Dysphagia, weight loss  HPI: Carrie [name] is a 81 y.o. female with medical history significant of PAF s/p DCCV 5/3 with ERAF, chronic HFpEF, Follicular lymphoma stage IIIa s/p chemotherapy with pancytopenia, hypothyroidism, HTN, HLD, stage IIIb CKD, and chronic cough since recovering from covid-19 infection early 2023 on chronic O2 of 3L at NH,  who presents to ed from NH s/p unwitnessed fall with complaint of left hip pain.  During admission patient notes she has difficulty swallowing most days for the last several months.  Notes she will have feelings of food getting hung in her throat after swallowing 1-2 times per week.  She has dysphagia to liquids, solids, pills.  Denies pain with swallowing.  Denies nausea, vomiting, regurgitation.  She also reports 22 pound weight loss in the last 6 months due to decreased oral intake.  Colonoscopy 11/16/2010 with Dr. Lance Bosch examined colon normal, recall 5 years for surveillance for personal history of colonic polyps. She reports she had previous EGD about 10 years ago, no reports on file.  Patient has intermittent episodes of confusion during this admission.  When I evaluated the patient she was oriented to self, and time.  She does not seem aware of location unable to name president.  Per nursing staff shortly prior to my evaluation she mentioned that year was 41.    Past Medical History:  Diagnosis Date   A-fib [facility])    Anemia    pt denies    Atrial fibrillation with rapid ventricular response ([place]) 06/25/2015   Carpal tunnel syndrome, bilateral    pt denies    Cervical spondylosis without myelopathy 10/25/2013   Dental bridge present    DJD (degenerative joint disease)    Dyspnea    sometimes hx of cancer non hodgkins lymphoma    Dysrhythmia    WENT INTO A FIB IN 2017    Elevated troponin 11/23/2033    Follicular lymphoma grade 3a ([place]) 02/03/2015   hx of nonhodgkins lymphoma x 3    Headache    sometimes a headache    History of echocardiogram    Echo 12/16: EF 60-65%, no RWMA, severe LAE   History of nuclear stress test    Myoview 1/17: EF 55%, Normal study. No ischemia or scar.   Hypertension    Hypothyroid    Nausea without vomiting 06/20/2015   Stage III chronic kidney disease ([place]) 07/01/2015   Thrush of mouth and esophagus ([place]) 06/25/2015    Past Surgical History:  Procedure Laterality Date   ABDOMINAL HYSTERECTOMY     APPENDECTOMY     BACK SURGERY     X5-lumbar-fusion   CARDIOVERSION N/A 02/17/2022   Procedure: CARDIOVERSION;  Surgeon: Berniece Salines, DO;  Location: [place] [facility];  Service: Cardiovascular;  Laterality: N/A;   COLONOSCOPY     DILATION AND CURETTAGE OF UTERUS     INTRAMEDULLARY (IM) NAIL INTERTROCHANTERIC Left 04/22/2022   Procedure: INTRAMEDULLARY (IM) NAIL INTERTROCHANTRIC;  Surgeon: Paralee Cancel, MD;  Location: [facility];  Service: Orthopedics;  Laterality: Left;  Biomet short nail, [place]   LYMPH NODE BIOPSY Right 01/21/2015   Procedure: RIGHT GROIN LYMPH NODE BIOPSY;  Surgeon: Erroll Luna, MD;  Location: [place];  Service: General;  Laterality: Right;   MASS EXCISION Left 08/29/2018   Procedure: EXCISION LEFT BACK  MASS;  Surgeon: Erroll Luna, MD;  Location: [place];  Service: General;  Laterality: Left;   Ovarian cyst resection     patelar tendon transplants     Left/right   PORTACATH PLACEMENT Right 02/13/2015   Procedure: INSERTION PORT-A-CATH WITH ULTRASOUND;  Surgeon: Erroll Luna, MD;  Location: [place];  Service: General;  Laterality: Right;   PORTACATH PLACEMENT N/A 08/29/2018   Procedure: INSERTION PORT-A-CATH WITH ULTRA SOUND ERAS PATHWAY;  Surgeon: Erroll Luna, MD;  Location: [place];  Service: General;  Laterality: N/A;   REVERSE SHOULDER ARTHROPLASTY Left 03/02/2021   Procedure: REVERSE SHOULDER ARTHROPLASTY;  Surgeon: Netta Cedars, MD;  Location: [facility];  Service: Orthopedics;  Laterality: Left;   RIGHT/LEFT HEART CATH AND CORONARY ANGIOGRAPHY N/A 01/15/2022   Procedure: RIGHT/LEFT HEART CATH AND CORONARY ANGIOGRAPHY;  Surgeon: [name], MD;  Location: [place] [facility];  Service: Cardiovascular;  Laterality: N/A;   TOTAL KNEE ARTHROPLASTY  2011   Right   TUBAL LIGATION      Prior to Admission medications   Medication Sig Start Date End Date Taking? Authorizing Provider  acetaminophen (TYLENOL) 500 MG tablet Take 500 mg by mouth every 6 (six) hours as needed for mild pain or headache.   Yes [provider]  amiodarone (PACERONE) 200 MG tablet Take 1 tab (200 mg total) twice daily for 1 week and then reduce to 1 tablet (200 mg total) once daily. Patient taking differently: Take 200 mg by mouth daily. 02/18/22  Yes Hongalgi, Lenis Dickinson, MD  apixaban (ELIQUIS) 5 MG TABS tablet Take 5 mg by mouth 2 (two) times daily.    Yes [provider]  Artificial Saliva (BIOTENE DRY MOUTH) LOZG Use as directed 1 lozenge in the mouth or throat every 4 (four) hours as needed (for dry mouth).   Yes [provider]  furosemide (LASIX) 40 MG tablet Take 1 tablet (40 mg total) by mouth daily. 03/04/22  Yes Little Ishikawa, MD  ipratropium-albuterol (DUONEB) 0.5-2.5 (3) MG/3ML SOLN Take 3 mLs by nebulization every 6 (six) hours as needed (for wheezing or shortness of breath).   Yes [provider]  levothyroxine (SYNTHROID) 88 MCG tablet Take 1 tablet (88 mcg total) by mouth daily before breakfast. 01/24/22  Yes Lavina Hamman, MD  Lidocaine-Prilocaine, Bulk, 2-2 % CREA Apply 1 application  topically daily as needed (:"for skin condition").   Yes [provider]  metoprolol tartrate (LOPRESSOR) 50 MG tablet Take 1 tablet (50 mg total) by mouth 2 (two) times daily. 03/03/22  Yes Little Ishikawa, MD  OXYGEN Inhale 2 L/min into the lungs as needed (for shortness of breath).   Yes  [provider]  polyvinyl alcohol (LIQUIFILM TEARS) 1.4 % ophthalmic solution Place 1 drop into both eyes every 8 (eight) hours as needed for dry eyes.   Yes [provider]  senna (SENOKOT) 8.6 MG TABS tablet Take 2 tablets by mouth daily as needed for mild constipation.   Yes [provider]  traZODone (DESYREL) 50 MG tablet Take 1 tablet (50 mg total) by mouth at bedtime. Patient taking differently: Take 50 mg by mouth every evening. 03/03/22  Yes Little Ishikawa, MD  lidocaine-prilocaine (EMLA) cream Apply 1 application topically daily as needed. Patient not taking: Reported on 05/05/2022 04/07/21   [name], MD    Scheduled Meds:  amiodarone  200 mg Oral Daily   Chlorhexidine Gluconate Cloth  6 each Topical Daily   docusate sodium  100 mg Oral BID   levothyroxine  88  mcg Oral Q0600   metoprolol tartrate  25 mg Oral BID   mouth rinse  15 mL Mouth Rinse 4 times per day   Continuous Infusions:  sodium chloride Stopped (05/09/2022 2321)   sodium chloride 10 mL/hr at 04/22/22 0600   0.9 % NaCl with KCl 20 mEq / L     azithromycin Stopped (04/21/22 2249)   cefTRIAXone (ROCEPHIN)  IV Stopped (04/22/22 0035)   methocarbamol (ROBAXIN) IV     PRN Meds:.sodium chloride, sodium chloride, acetaminophen, bisacodyl, guaiFENesin-dextromethorphan, HYDROcodone-acetaminophen, ipratropium-albuterol, menthol-cetylpyridinium **OR** phenol, methocarbamol **OR** methocarbamol (ROBAXIN) IV, metoCLOPramide **OR** metoCLOPramide (REGLAN) injection, morphine injection, ondansetron **OR** ondansetron (ZOFRAN) IV, mouth rinse, mouth rinse, polyethylene glycol, sodium chloride flush  Allergies as of 05/08/2022 - Review Complete 05/05/2022  Allergen Reaction Noted   Amiodarone Other (See Comments) 06/13/2019    Family History  Problem Relation Age of Onset   Cancer Mother        Breast, lung NHL   Cancer Sister        Multiple myeloma    Social History   Socioeconomic  History   Marital status: Widowed    Spouse name: Not on file   Number of children: 2   Years of education: hs   Highest education level: Not on file  Occupational History   Occupation: Retired  Tobacco Use   Smoking status: Never   Smokeless tobacco: Never  Vaping Use   Vaping Use: Never used  Substance and Sexual Activity   Alcohol use: Not Currently   Drug use: No   Sexual activity: Not on file  Other Topics Concern   Not on file  Social History Narrative   Lives alone.  Has a walker for home use.   Social Determinants of Health   Financial Resource Strain: Not on file  Food Insecurity: Not on file  Transportation Needs: Not on file  Physical Activity: Not on file  Stress: Not on file  Social Connections: Not on file  Intimate Partner Violence: Not on file    Review of Systems: Review of Systems  Constitutional:  Positive for weight loss. Negative for chills, fever and malaise/fatigue.  HENT:  Negative for hearing loss and tinnitus.   Eyes:  Negative for blurred vision and double vision.  Respiratory:  Negative for cough and hemoptysis.   Cardiovascular:  Negative for chest pain and palpitations.  Gastrointestinal:  Negative for abdominal pain, blood in stool, constipation, diarrhea, heartburn, melena, nausea and vomiting.  Genitourinary:  Negative for dysuria and urgency.  Musculoskeletal:  Negative for myalgias and neck pain.  Skin:  Negative for itching and rash.  Neurological:  Positive for weakness. Negative for dizziness and headaches.  Endo/Heme/Allergies:  Negative for environmental allergies. Does not bruise/bleed easily.  Psychiatric/Behavioral:  Negative for depression, substance abuse and suicidal ideas.      Physical Exam:Physical Exam Constitutional:      General: She is not in acute distress.    Appearance: Normal appearance. She is normal weight.  HENT:     Head: Normocephalic and atraumatic.     Right Ear: External ear normal.     Left Ear:  External ear normal.     Nose: Nose normal.     Mouth/Throat:     Mouth: Mucous membranes are moist.  Eyes:     Pupils: Pupils are equal, round, and reactive to light.  Cardiovascular:     Rate and Rhythm: Normal rate and regular rhythm.     Pulses: Normal pulses.  Heart sounds: Normal heart sounds.  Pulmonary:     Effort: Pulmonary effort is normal.     Breath sounds: Normal breath sounds.  Abdominal:     General: Abdomen is flat. Bowel sounds are normal. There is no distension.     Palpations: Abdomen is soft. There is no mass.     Tenderness: There is no abdominal tenderness. There is no guarding or rebound.     Hernia: No hernia is present.  Musculoskeletal:        General: No swelling. Normal range of motion.     Cervical back: Normal range of motion and neck supple.  Skin:    General: Skin is warm and dry.     Coloration: Skin is not pale.  Neurological:     General: No focal deficit present.     Mental Status: She is alert and oriented to person, place, and time. Mental status is at baseline.  Psychiatric:        Mood and Affect: Mood normal.        Behavior: Behavior normal.    Vital signs: Vitals:   04/22/22 0742 04/22/22 1045  BP:  131/74  Pulse:  63  Resp:  (!) 23  Temp: 98.4 F (36.9 C) 98.4 F (36.9 C)  SpO2:  96%   Last BM Date : 05/04/2022    GI:  Lab Results: Recent Labs    04/20/22 0634 04/21/22 0455 04/22/22 0626  WBC 2.3* 1.9* 1.5*  HGB 10.0* 9.1* 9.3*  HCT 29.8* 27.4* 28.3*  PLT 181 166 160   BMET Recent Labs    04/20/22 0634 04/21/22 0455 04/22/22 0626  NA 132* 132* 132*  K 3.8 4.1 3.6  CL 96* 98 97*  CO2 '25 27 26  ' GLUCOSE 247* 179* 162*  BUN 24* 23 16  CREATININE 1.18* 1.06* 0.92  CALCIUM 7.1* 7.5* 7.6*   LFT No results for input(s): "PROT", "ALBUMIN", "AST", "ALT", "ALKPHOS", "BILITOT", "BILIDIR", "IBILI" in the last 72 hours. PT/INR Recent Labs    04/20/22 0634  LABPROT 18.9*  INR 1.6*      Studies/Results: MR BRAIN WO CONTRAST  Result Date: 04/22/2022 CLINICAL DATA:  Neuro deficit. New onset confusion after hip replacement EXAM: MRI HEAD WITHOUT CONTRAST TECHNIQUE: Multiplanar, multiecho pulse sequences of the brain and surrounding structures were obtained without intravenous contrast. COMPARISON:  Head CT from 4 days ago FINDINGS: Brain: No acute infarction, hemorrhage, hydrocephalus, extra-axial collection or mass lesion. Extensive chronic small vessel ischemia in the supratentorial white matter with confluent gliosis and scattered deep white matter lacunes. Moderate cerebral volume loss. Few scattered remote microhemorrhages without specific pattern. Vascular: Normal flow voids. Skull and upper cervical spine: No focal marrow lesion. Cervical spine degeneration with C3-4 anterolisthesis. Sinuses/Orbits: Patchy sinus opacification with bilateral sphenoid sinus and right mastoid fluid levels. Other than mucosal thickening at the floor the left maxillary sinus, this opacification is new. Other: Intermittent motion artifact. IMPRESSION: 1. No acute or reversible finding. 2. Brain atrophy and extensive chronic small vessel ischemia. 3. Sinus and right mastoid opacification since admission head CT. Electronically Signed   By: Jorje Guild M.D.   On: 04/22/2022 11:54    Impression: Dysphagia Aspiration  Intermittent dysphagia to solids, liquids, pills for several months.  No previous EGD on file. Aspiration noted on modified barium swallow. Speech therapy recommended teaspoons of thin/nectar liquids only and GI consult. Unable to proceed with esophagram due to patient's aspiration.  Patient with significant weight loss over the  last 6 months due to decreased oral intake.  We will need to evaluate further with EGD since unable to proceed with barium esophagram.   Plan: Continue diet per speech language pathology recommendations. Plan for EGD w/ possible dilation 7/8. I  thoroughly discussed the procedures to include nature, alternatives, benefits, and risks including but not limited to bleeding, perforation, infection, anesthesia/cardiac and pulmonary complications. Patient provides understanding and gave verbal consent to proceed. NPO at midnight 7/8 Continue anti-emetics and supportive care as needed. [facility] will follow.     LOS: 4 days   Charlott Rakes  PA-C 04/22/2022, 1:28 PM  Contact #  [phone]

## 2022-04-22 NOTE — Assessment & Plan Note (Addendum)
This was an incidental finding.  Based on report from family, seems to have been present for some time.  But was not an initial presenting complaint, nor volunteered by patient/family until noted by nursing to have recurrent choking, coughing.    Subsequent SLP eval and MBS appears to show severe UES dysfunction and pharyngoesophageal dysphagia. - Hold Eliquis for EGD - Consult GI, appreciate expertise - Clears only, via teaspoon

## 2022-04-22 NOTE — Progress Notes (Incomplete Revision)
MBS completed, Full report to follow.  Pt presents with mild oral and severe cervical esophageal dysphagia.  Prolonged oral transiting noted across all boluses tested * puree bolus x1, thin x3, and nectar x2.  Primary dysphagia is cervical esophageal with very poor UES opening resulting in NO clearance of small pudding bolus through UES and poor clearance of larger boluses of liquids *via cup.  Pt sensed gross retention with pudding - prompting her to reflexively cough. Small amount of thin liquid given in hope to aid UES transiting/clearance - however this was not effective and resulted in aspiration of thin as pyriform sinus retention spilled into open airway.    Reflexive strong coughing and expectorating cleared large amount of pudding/liquids from pharynx into oral cavity where she used oral suction and tissues for oral expectoration.  Pt is protective of her airway with tsps of nectar and thin liquids only.  Pt's strong reflexive cough response is protective.      SLP Spoke to MD (Dr Loleta Books) on the phone re MBS results and diagnosed dysphagia.  Pt's currently with increased aspiration and malnutrition risk given decreased ability to mobilize and self report of weight loss with problems swallowing for 2 months.    Recommend tsps of thin/nectar liquids only and GI consult.  Esophagram unable to be completed due to pt's aspiration.   Carrie Lime, MS [facility] SLP Acute Rehab Services Office [phone] Pager [phone]

## 2022-04-23 ENCOUNTER — Encounter (HOSPITAL_COMMUNITY): Payer: Self-pay | Admitting: Internal Medicine

## 2022-04-23 DIAGNOSIS — G9341 Metabolic encephalopathy: Secondary | ICD-10-CM | POA: Diagnosis not present

## 2022-04-23 DIAGNOSIS — D6869 Other thrombophilia: Secondary | ICD-10-CM | POA: Diagnosis not present

## 2022-04-23 DIAGNOSIS — S72002A Fracture of unspecified part of neck of left femur, initial encounter for closed fracture: Secondary | ICD-10-CM | POA: Diagnosis not present

## 2022-04-23 DIAGNOSIS — D62 Acute posthemorrhagic anemia: Secondary | ICD-10-CM | POA: Diagnosis not present

## 2022-04-23 MED ORDER — FOOD THICKENER (SIMPLYTHICK)
1.0000 | ORAL | Status: DC | PRN
  Filled 2022-04-23: qty 1

## 2022-04-23 MED ORDER — DOCUSATE SODIUM 50 MG/5ML PO LIQD
100.0000 mg | Freq: Two times a day (BID) | ORAL | Status: DC
  Administered 2022-04-23 – 2022-04-27 (×7): 100 mg via ORAL
  Filled 2022-04-23 (×8): qty 10

## 2022-04-23 NOTE — Progress Notes (Signed)
Pt. Was on the phone with her husband [name]. Pt. Refused blood draws by this IV Nurse. Husband on phone helped explained what the blood draws for, still pt. Declined. RN notified and told this IV Nurse to just document that pt. Refused. This IV Nurse requested RN to notify MD of pt.'s refusal. Pt. Was reported confused.

## 2022-04-23 NOTE — Plan of Care (Signed)
  Problem: Education: Goal: Knowledge of General Education information will improve Description: Including pain rating scale, medication(s)/side effects and non-pharmacologic comfort measures Outcome: Not Progressing   Problem: Health Behavior/Discharge Planning: Goal: Ability to manage health-related needs will improve Outcome: Not Progressing   Problem: Coping: Goal: Level of anxiety will decrease Outcome: Not Progressing   Problem: Elimination: Goal: Will not experience complications related to urinary retention Outcome: Progressing   Problem: Pain Managment: Goal: General experience of comfort will improve Outcome: Progressing

## 2022-04-23 NOTE — Progress Notes (Signed)
[facility] Gastroenterology Progress Note  Carrie [name] 81 y.o. 03-01-1941   Subjective: Patient seen and examined lying in bed.  Patient recalls the date 04/23/1942, she is aware she is in the hospital unable to recall the president. Called Cindee Lame the patients daughter to consent for EGD tomorrow.   ROS : Review of Systems  Unable to perform ROS: Dementia    Objective: Vital signs in last 24 hours: Vitals:   04/23/22 1010 04/23/22 1315  BP: 135/88 (!) 124/92  Pulse: (!) 116 (!) 108  Resp: 18 18  Temp: 98.3 F (36.8 C) 98.2 F (36.8 C)  SpO2: 92% 100%    Physical Exam:  General:  Alert, cooperative, no distress, appears stated age  Head:  Normocephalic, without obvious abnormality, atraumatic  Eyes:  Anicteric sclera, EOM's intact  Lungs:   Clear to auscultation bilaterally, respirations unlabored  Heart:  Regular rate and rhythm, S1, S2 normal  Abdomen:   Soft, non-tender, bowel sounds active all four quadrants,  no masses,   Extremities: Extremities normal, atraumatic, no  edema  Pulses: 2+ and symmetric    Lab Results: Recent Labs    04/21/22 0455 04/22/22 0626  NA 132* 132*  K 4.1 3.6  CL 98 97*  CO2 27 26  GLUCOSE 179* 162*  BUN 23 16  CREATININE 1.06* 0.92  CALCIUM 7.5* 7.6*   No results for input(s): "AST", "ALT", "ALKPHOS", "BILITOT", "PROT", "ALBUMIN" in the last 72 hours. Recent Labs    04/21/22 0455 04/22/22 0626  WBC 1.9* 1.5*  HGB 9.1* 9.3*  HCT 27.4* 28.3*  MCV 88.7 90.1  PLT 166 160   No results for input(s): "LABPROT", "INR" in the last 72 hours.    Assessment Dysphagia Aspiration   Intermittent dysphagia to solids, liquids, pills for several months.  No previous EGD on file. Aspiration noted on modified barium swallow. Speech therapy recommended teaspoons of thin/nectar liquids only and GI consult. Unable to proceed with esophagram due to patient's aspiration.  Patient with significant weight loss over the last 6 months due  to decreased oral intake.  We will need to evaluate further with EGD since unable to proceed with barium esophagram.   Plan: Plan for EGD with dilation tomorrow. I thoroughly discussed the procedures to include nature, alternatives, benefits, and risks including but not limited to bleeding, perforation, infection, anesthesia/cardiac and pulmonary complications to the patient and I called her daughter Urban Gibson who will be providing consent due to impaired mental status. Daughter provides understanding and gave verbal consent to proceed.  The daughter notes the patients son Remo Lipps will be present at bedside tomorrow and would like updates after procedure to be given to him. Continue Protonix IV '40mg'$  BID Continue diet per SP recommendations.  NPO at midnight Continue anti-emetics and supportive care as needed. [facility] will follow.     Arvella Nigh Jaystin Mcgarvey PA-C 04/23/2022, 1:24 PM  Contact #  [phone]

## 2022-04-23 NOTE — Progress Notes (Addendum)
Speech Language Pathology Treatment: Dysphagia  Patient Details Name: Carrie [name] MRN: [id number] DOB: 16-Feb-1941 Today's Date: 04/23/2022 Time:  -     Assessment / Plan / Recommendation Clinical Impression  SLP follow up regarding pt's swallow function including tolerance of tsp of liquids today.  RN reports pt is continuing to cough with intake and frequently expectorating.  Pt alert, oriented to year, president and admits to PROGRESSIVE dysphagia.  She is observed with few boluses of thinner including [place] ice, jello and water via tsp and cup.  Oral holding noted up to 20 seconds with boluses with pt benefiting from moderate cues to swallow. Self feeding is maximizing her safety - thus encouraged her to continue.  Coughing continues as well as pt complaint of retention (pointing to distal pharynx) after swallow- most notably from cup bolus of thin water.  Cough is productive to jello after swallow - likely from pharyngea/cervical esophageal retention.  Tsp amounts of liquids tolerated without overt coughing - due to smaller bolus size allowing retention to remain at pyriform sinus/above UES.  Educated pt and her SO to clinical reasoning for precautions/diet recommendations and findings of test. Nursing staff noted pt overtly coughing with intake earlier during hospital coarse prompting swallow evaluation.   Pt's aspiration could contribute to her current atypical pneumonia as well.      SLP remains hopeful that GI intervention will improve pt's swallow function significantly thus helping to maximize nutrition, hydration and airway protection in this pt who has suffered a hip fx s/p surgery.  Intervention/improvement in swallowing can significantly improve pt's overall outcomes and QOL and decreased chance of hospital readmission.  Reviewed MBS flouro loops with pt's RN to facilitate carryover of compensations.    Pt reports "I'm hoping this will help me with my swallowing" as she reports  chronic issues that have significantly worsened.   HPI HPI: 81 yo female adm to [facility] after fall, She is s/p left hip ORIF.  PMH + for non-Hodgkins lymphoma, COVID 19, chronic small vessel disease per CT brain - for MRI ? today. Pt also now with rhinovirus and is on droplet precautions.  Swallow evaluation ordered due to pt having coughing with intake and reporting dysphagia.   Pt attributes some of her recent weight loss to dysphagia. CT chest  showed Infiltrate or atelectasis is seen posterior lower  lobes bilaterally. Multifocal ill-defined areas of airspace opacity  are also seen in the upper lung zones bilaterally no evidence of  pleural effusion.      SLP Plan  Continue with current plan of care      Recommendations for follow up therapy are one component of a multi-disciplinary discharge planning process, led by the attending physician.  Recommendations may be updated based on patient status, additional functional criteria and insurance authorization.    Recommendations  Diet recommendations: Thin liquid Liquids provided via: Teaspoon Medication Administration: Other (Comment) (prefer liquid format) Supervision: Patient able to self feed;Full supervision/cueing for compensatory strategies Compensations: Slow rate;Small sips/bites Postural Changes and/or Swallow Maneuvers: Seated upright 90 degrees;Upright 30-60 min after meal                Oral Care Recommendations: Oral care BID Follow Up Recommendations: No SLP follow up Assistance recommended at discharge: Frequent or constant Supervision/Assistance SLP Visit Diagnosis: Dysphagia, oral phase (R13.11);Dysphagia, pharyngoesophageal phase (R13.14) Plan: Continue with current plan of care          Carrie K, MS [place] Office  [place] Pager [phone]  Carrie [name]  04/23/2022, 6:53 PM

## 2022-04-23 NOTE — Progress Notes (Signed)
   04/23/22 0938  Assess: MEWS Score  Temp 98.6 F (37 C)  BP (!) 142/96  MAP (mmHg) 112  Pulse Rate (!) 112  Resp 17  SpO2 99 %  O2 Device Nasal Cannula  O2 Flow Rate (L/min) 4 L/min  Assess: MEWS Score  MEWS Temp 0  MEWS Systolic 0  MEWS Pulse 2  MEWS RR 0  MEWS LOC 0  MEWS Score 2  MEWS Score Color Yellow  Assess: if the MEWS score is Yellow or Red  Were vital signs taken at a resting state? Yes  Focused Assessment No change from prior assessment  MEWS guidelines implemented *See Row Information* Yes  Take Vital Signs  Increase Vital Sign Frequency  Yellow: Q 2hr X 2 then Q 4hr X 2, if remains yellow, continue Q 4hrs  Escalate  MEWS: Escalate Yellow: discuss with charge nurse/RN and consider discussing with provider and RRT  Notify: Charge Nurse/RN  Name of Charge Nurse/RN Notified MYSELF  Notify: Provider  Provider Name/Title J. Olena Heckle NP  Date Provider Notified 04/22/22  Time Provider Notified [phone]  Method of Notification Page (secure chat)  Notification Reason Change in status  Provider response No new orders  Date of Provider Response 04/23/22  Time of Provider Response [id number]  Notify: Rapid Response  Name of Rapid Response RN Notified Erin RN RRRN  Date Rapid Response Notified 04/23/22  Time Rapid Response Notified [id number]  Assess: SIRS CRITERIA  SIRS Temperature  0  SIRS Pulse 1  SIRS Respirations  0  SIRS WBC 0  SIRS Score Sum  1

## 2022-04-23 NOTE — Progress Notes (Signed)
Progress Note   Patient: Carrie [name] [id number]:[id number] DOB: Mar 25, 1941 DOA: 04/17/2022     5 DOS: the patient was seen and examined on 04/23/2022        Brief hospital course: Mrs. Hoobler is a 81 y.o. F with hx follicular lymphoma stage IIIa s/p chemotherapy with pancytopenia, dCHF, cAF on Eliquis, COVID last Feb with chronic cough, NOT on home O2, hypothyroidism, HTN, HLD, stage IIIb CKD who presented with confusion and unwitnessed fall with complaint of left hip pain.   In the ER, patient noted to have hypoxia, new hip fracture.  CTA chest showed no PE but confirmed mild multifocal infiltrates.       7/2: Admitted for hip fracture 7/3: Developed pre-op hypotension due to ABLA requiring 2u PRBCs, stabilised and went to OR in afternoon for ORIF 7/4: Hypotensive again overnight, got 3rd unit PRBCs, started on Levophed briefly, CCM consulted 7/5 transferred out of unit     Assessment and Plan: * Hip fracture ([place]) S/p ORIF 7/3 by Dr. Alvan Dame Some concern for internal rotation of hip yesterday, but x-rays show hardware and fracture in good position - Consult Orthopedics, appreciate recommendations - 50% partial weight bearing to left - DVT ppx: Resume Eliquis when cleared by GI, SCDs for now  Acute blood loss anemia Hgb range has been 8-12 over the last 6 months, recently more 8-10.  On admission, Hgb <7 g/dL. Transfused 3 units this hospitalization.   Stable since surgery.  No clinical bleeding - Trend Hgb   Sepsis ([place]) Presented with tachycardia, leukopenia, encephalopathy, worsening respiratory failure in the setting of worsening pulmonary infiltrates.  At this time, shock is not present  Urine culture growing citrobacter susc to CTX.  RVP positive for rhinovirus.  Completed 5 days antibiotics. Resolved.  CAP (community acquired pneumonia) Agree, more likely is just viral LRTI  UTI (urinary tract infection) See above  Acute metabolic encephalopathy At baseline, patient  lives alone, still drives.  Since her last hospitalzation she has been in rehab, and has had intermittent disorientation, more cognitive processing deficits than normal.  Here, she was somnolent and sluggish on presentation due to sepsis.   During the first two days of hospital stay, before and immediately after surgery, she was oriented and mostly appropriate.  In the last two days, she has had waxing and waning confusion, and this is again present today .  Delirium precautions:   -Lights and TV off, minimize interruptions at night  -Blinds open and lights on during day  -Glasses/hearing aid with patient  -Frequent reorientation  -PT/OT when able  -Avoid sedation medications/Beers list medications    Dysphagia This was an incidental finding.  Based on report from family, seems to have been present for some time.  But was not an initial presenting complaint, nor volunteered by patient/family until noted by nursing to have recurrent choking, coughing.    Subsequent SLP eval and MBS appears to show severe UES dysfunction and pharyngoesophageal dysphagia. - Hold Eliquis for EGD - Consult GI, appreciate expertise - Clears only, via teaspoon  Hypocalcemia Supplemented  Myocardial injury Due to sepsis, hypotension, no angina or signs of ischemia.  Acute on chronic respiratory failure with hypoxia (HCC) Here, she had new infiltrates, has had hypoxia to the mid-80s, respiratory rates in the 30s and requires 4L O2 to maintain O2 sats up from her recent baseline 3L.  At present suspect this is more atypical pneumonia than CHF  Now mostly resolved back to basleine o2 needs  Hypomagnesemia Supplemented and resolved  Hyponatremia Due to hypovolemia, stable - Hold furosemide  Atrial fibrillation, chronic (HCC) - Continue Eliquis at 2.5 for now (weight and age right at cusp of 2.5 dose, and given bleeding, will be cautious) - Continue amiodarone and metoprolol  Chronic diastolic CHF  (congestive heart failure) (HCC) Appears euvolemic to dry - Hold Lasix    Acquired hypercoagulable state ([place]) Due to Afib    Pulmonary hypertension (HCC) - Hold furosemide for now  Leukopenia Chronic and intermittent, stable  Essential hypertension BP low normal - Hold furosemide - Continue amiodarone, metoprolol   Hypokalemia Supplemented and resolved  Stage 3a chronic kidney disease ([place]) AKI ruled out. Creatinine stable  Grade 3a follicular lymphoma of lymph nodes of multiple regions ([place]) In remission on rituximab maintenance, last dose Jan 2023  Hypothyroidism TSH 2 months ago normal - Continue levothyroxine          Subjective: Patient is confused.  Nursing reports confusion overnight and this morning.  No respiratory distress, fever.  Patient denies pain in her hip.  She denies chest pain or tachycardia.     Physical Exam: Vitals:   04/23/22 0632 04/23/22 0736 04/23/22 1010 04/23/22 1315  BP: (!) 142/96 (!) 131/103 135/88 (!) 124/92  Pulse: (!) 112 (!) 110 (!) 116 (!) 108  Resp: '17 18 18 18  '$ Temp: 98.6 F (37 C) 98.4 F (36.9 C) 98.3 F (36.8 C) 98.2 F (36.8 C)  TempSrc: Oral Oral Oral Oral  SpO2: 99% 99% 92% 100%  Weight:      Height:       Elderly adult female, sitting up in bed, looking at the window, appears interactive Tachycardic, regular, no murmurs, mild nonpitting lower extremity edema, worse in the left Respiratory rate normal, lungs clear without rales or wheezes Abdomen soft without tenderness palpation or guarding, no ascites or distention The left hip dressings are clean dry and intact without bruising or surrounding redness or pain Attention is diminished, affect blunted, judgment insight appear impaired, she is oriented to self only, moves upper extremities with normal strength and coordination, speech fluent, face symmetric  Data Reviewed: Reviewed labs this morning  Family Communication: Daughter by  phone    Disposition: Status is: Inpatient         Author: Edwin Dada, MD 04/23/2022 3:54 PM  For on call review [email or web address].

## 2022-04-23 NOTE — Progress Notes (Signed)
Physical Therapy Treatment Patient Details Name: Carrie [name] MRN: [id number] DOB: April 06, 1941 Today's Date: 04/23/2022   History of Present Illness Patient is a 81 year old female who presented with confusion and L hip pain after a fall. patient was found to have a comminuted L intertrocanteric fx. patient was transitioned to step down unit on 7/3 with hypotension and acute blood loss anemia. patient underwent IM nail on 7/3 evening. PMH: non-hodgkins lymphoma, a fib s/p cardioversion, HTN, CHF, CKD, chronic cough post covid.    PT Comments    Patient less alert than previously. Patient indicating that she does not want to sit up. +2 total assistance to move to sitting  on edge of bed. Attempted to stand at [facility] but patient not partipating. Assisted back into bed.  Continue PT  Recommendations for follow up therapy are one component of a multi-disciplinary discharge planning process, led by the attending physician.  Recommendations may be updated based on patient status, additional functional criteria and insurance authorization.  Follow Up Recommendations    Can patient physically be transported by private vehicle: No   Assistance Recommended at Discharge Frequent or constant Supervision/Assistance  Patient can return home with the following A lot of help with walking and/or transfers;A lot of help with bathing/dressing/bathroom;Help with stairs or ramp for entrance;Assist for transportation   Equipment Recommendations  None recommended by PT    Recommendations for Other Services       Precautions / Restrictions Precautions Precautions: Fall Restrictions Weight Bearing Restrictions: Yes LLE Weight Bearing: Partial weight bearing LLE Partial Weight Bearing Percentage or Pounds: 50%     Mobility  Bed Mobility   Bed Mobility: Supine to Sit, Sit to Supine     Supine to sit: +2 for safety/equipment, +2 for physical assistance, Total assist Sit to supine: +2 for  safety/equipment, HOB elevated, +2 for physical assistance   General bed mobility comments: patiwnt did not put forth effort to move legs. patient not sitting well, trying to return to supine.    Transfers                   General transfer comment: placed Stedy infront , had patient  reach for  bar, patient did not maintain grip and did not  want to  try standing .    Ambulation/Gait                   Stairs             Wheelchair Mobility    Modified Rankin (Stroke Patients Only)       Balance   Sitting-balance support: Feet supported, Bilateral upper extremity supported Sitting balance-Leahy Scale: Zero Sitting balance - Comments: patient listing to the right , not supporting self at all                                    Cognition Arousal/Alertness: Awake/alert Behavior During Therapy: Restless Overall Cognitive Status: History of cognitive impairments - at baseline                                 General Comments: patient much less alert and participatory, states that she does not want to get up        Exercises      General Comments  Pertinent Vitals/Pain Pain Assessment Faces Pain Scale: Hurts a little bit Pain Location: general Pain Intervention(s): Limited activity within patient's tolerance    Home Living                          Prior Function            PT Goals (current goals can now be found in the care plan section) Progress towards PT goals: Progressing toward goals    Frequency           PT Plan Current plan remains appropriate    Co-evaluation              AM-PAC PT "6 Clicks" Mobility   Outcome Measure  Help needed turning from your back to your side while in a flat bed without using bedrails?: Total Help needed moving from lying on your back to sitting on the side of a flat bed without using bedrails?: Total Help needed moving to and from a bed to  a chair (including a wheelchair)?: Total Help needed standing up from a chair using your arms (e.g., wheelchair or bedside chair)?: Total Help needed to walk in hospital room?: Total Help needed climbing 3-5 steps with a railing? : Total 6 Click Score: 6    End of Session Equipment Utilized During Treatment: Gait belt Activity Tolerance: Patient limited by fatigue Patient left: with call bell/phone within reach;in bed;with bed alarm set Nurse Communication: Mobility status;Need for lift equipment PT Visit Diagnosis: Unsteadiness on feet (R26.81);Pain;Repeated falls (R29.6)     Time: 1040-1100 PT Time Calculation (min) (ACUTE ONLY): 20 min  Charges:  $Therapeutic Activity: 8-22 mins                     [place] Office [phone] Weekend [phone]    Claretha Cooper 04/23/2022, 1:06 PM

## 2022-04-23 NOTE — Progress Notes (Signed)
X-rays of the hip reviewed. These show the hardware and fracture in stable positioning.   Costella Hatcher, PA-C Orthopedic Surgery [facility] [place] [phone]

## 2022-04-23 NOTE — Progress Notes (Signed)
Clarene Essex NP notified that Pt refused AM labs.

## 2022-04-23 NOTE — Plan of Care (Signed)

## 2022-04-24 ENCOUNTER — Inpatient Hospital Stay (HOSPITAL_COMMUNITY): Payer: No Typology Code available for payment source | Admitting: Anesthesiology

## 2022-04-24 ENCOUNTER — Inpatient Hospital Stay (HOSPITAL_COMMUNITY): Payer: No Typology Code available for payment source

## 2022-04-24 ENCOUNTER — Encounter (HOSPITAL_COMMUNITY): Payer: Self-pay | Admitting: Internal Medicine

## 2022-04-24 ENCOUNTER — Encounter (HOSPITAL_COMMUNITY): Admission: EM | Disposition: E | Payer: Self-pay | Source: Skilled Nursing Facility | Attending: Family Medicine

## 2022-04-24 DIAGNOSIS — D62 Acute posthemorrhagic anemia: Secondary | ICD-10-CM | POA: Diagnosis not present

## 2022-04-24 DIAGNOSIS — I2510 Atherosclerotic heart disease of native coronary artery without angina pectoris: Secondary | ICD-10-CM

## 2022-04-24 DIAGNOSIS — K2289 Other specified disease of esophagus: Secondary | ICD-10-CM | POA: Diagnosis not present

## 2022-04-24 DIAGNOSIS — I5030 Unspecified diastolic (congestive) heart failure: Secondary | ICD-10-CM | POA: Diagnosis not present

## 2022-04-24 DIAGNOSIS — D6869 Other thrombophilia: Secondary | ICD-10-CM | POA: Diagnosis not present

## 2022-04-24 DIAGNOSIS — I130 Hypertensive heart and chronic kidney disease with heart failure and stage 1 through stage 4 chronic kidney disease, or unspecified chronic kidney disease: Secondary | ICD-10-CM

## 2022-04-24 DIAGNOSIS — S72002A Fracture of unspecified part of neck of left femur, initial encounter for closed fracture: Secondary | ICD-10-CM | POA: Diagnosis not present

## 2022-04-24 DIAGNOSIS — G9341 Metabolic encephalopathy: Secondary | ICD-10-CM | POA: Diagnosis not present

## 2022-04-24 HISTORY — PX: ESOPHAGOGASTRODUODENOSCOPY: SHX5428

## 2022-04-24 LAB — BASIC METABOLIC PANEL
Anion gap: 10 (ref 5–15)
BUN: 14 mg/dL (ref 8–23)
CO2: 20 mmol/L — ABNORMAL LOW (ref 22–32)
Calcium: 7.6 mg/dL — ABNORMAL LOW (ref 8.9–10.3)
Chloride: 105 mmol/L (ref 98–111)
Creatinine, Ser: 0.88 mg/dL (ref 0.44–1.00)
GFR, Estimated: >60 mL/min (ref >60)
Glucose, Bld: 182 mg/dL — ABNORMAL HIGH (ref 70–99)
Potassium: 5.1 mmol/L (ref 3.5–5.1)
Sodium: 135 mmol/L (ref 135–145)

## 2022-04-24 LAB — GLUCOSE, CAPILLARY: Glucose-Capillary: 89 mg/dL (ref 70–99)

## 2022-04-24 LAB — CULTURE, BLOOD (ROUTINE X 2)
Culture: NO GROWTH
Culture: NO GROWTH

## 2022-04-24 LAB — PHOSPHORUS: Phosphorus: 3 mg/dL (ref 2.5–4.6)

## 2022-04-24 LAB — CBC
HCT: 29 % — ABNORMAL LOW (ref 36.0–46.0)
Hemoglobin: 9.3 g/dL — ABNORMAL LOW (ref 12.0–15.0)
MCH: 29.3 pg (ref 26.0–34.0)
MCHC: 32.1 g/dL (ref 30.0–36.0)
MCV: 91.5 fL (ref 80.0–100.0)
Platelets: 199 10*3/uL (ref 150–400)
RBC: 3.17 MIL/uL — ABNORMAL LOW (ref 3.87–5.11)
RDW: 20 % — ABNORMAL HIGH (ref 11.5–15.5)
WBC: 1.7 10*3/uL — ABNORMAL LOW (ref 4.0–10.5)
nRBC: 0 % (ref 0.0–0.2)

## 2022-04-24 LAB — MAGNESIUM: Magnesium: 1.9 mg/dL (ref 1.7–2.4)

## 2022-04-24 SURGERY — EGD (ESOPHAGOGASTRODUODENOSCOPY)
Anesthesia: Monitor Anesthesia Care

## 2022-04-24 MED ORDER — DEXTROSE-NACL 5-0.9 % IV SOLN: INTRAVENOUS | Status: DC

## 2022-04-24 MED ORDER — MAGNESIUM SULFATE 2 GM/50ML IV SOLN
2.0000 g | Freq: Once | INTRAVENOUS | Status: AC
  Administered 2022-04-24: 2 g via INTRAVENOUS
  Filled 2022-04-24: qty 50

## 2022-04-24 MED ORDER — PROPOFOL 500 MG/50ML IV EMUL
INTRAVENOUS | Status: AC
  Filled 2022-04-24: qty 50

## 2022-04-24 MED ORDER — LIP MEDEX EX OINT
TOPICAL_OINTMENT | CUTANEOUS | Status: DC | PRN
  Filled 2022-04-24: qty 7

## 2022-04-24 MED ORDER — PROPOFOL 10 MG/ML IV BOLUS
INTRAVENOUS | Status: DC | PRN
  Administered 2022-04-24: 10 mg via INTRAVENOUS

## 2022-04-24 MED ORDER — NYSTATIN 100000 UNIT/GM EX POWD
Freq: Two times a day (BID) | CUTANEOUS | Status: DC
  Filled 2022-04-24: qty 15

## 2022-04-24 MED ORDER — THIAMINE HCL 100 MG/ML IJ SOLN
100.0000 mg | Freq: Once | INTRAMUSCULAR | Status: AC
Start: 2022-04-24 — End: 2022-04-24
  Administered 2022-04-24: 100 mg via INTRAVENOUS
  Filled 2022-04-24: qty 2

## 2022-04-24 MED ORDER — LACTATED RINGERS IV SOLN: INTRAVENOUS | Status: DC | PRN

## 2022-04-24 MED ORDER — PHENYLEPHRINE 80 MCG/ML (10ML) SYRINGE FOR IV PUSH (FOR BLOOD PRESSURE SUPPORT)
PREFILLED_SYRINGE | INTRAVENOUS | Status: DC | PRN
  Administered 2022-04-24 (×2): 120 ug via INTRAVENOUS
  Administered 2022-04-24: 80 ug via INTRAVENOUS
  Administered 2022-04-24: 120 ug via INTRAVENOUS

## 2022-04-24 MED ORDER — PROPOFOL 500 MG/50ML IV EMUL
INTRAVENOUS | Status: DC | PRN
  Administered 2022-04-24: 75 ug/kg/min via INTRAVENOUS

## 2022-04-24 MED ORDER — IOHEXOL 300 MG/ML  SOLN
80.0000 mL | Freq: Once | INTRAMUSCULAR | Status: AC | PRN
  Administered 2022-04-24: 80 mL via INTRAVENOUS

## 2022-04-24 MED ORDER — INSULIN ASPART 100 UNIT/ML IJ SOLN
0.0000 [IU] | INTRAMUSCULAR | Status: DC
  Administered 2022-04-25: 2 [IU] via SUBCUTANEOUS
  Administered 2022-04-25 – 2022-04-27 (×5): 3 [IU] via SUBCUTANEOUS
  Administered 2022-04-27 (×2): 2 [IU] via SUBCUTANEOUS

## 2022-04-24 NOTE — Progress Notes (Signed)
Pt down to endo in stable condition. No needs at time of transfer.

## 2022-04-24 NOTE — Progress Notes (Signed)
Progress Note   Patient: Carrie [name] [id number] DOB: November 05, 1940 DOA: 05/04/2022     6 DOS: the patient was seen and examined on 05/12/2022 at 12:20 PM      Brief hospital course: Mrs. Gall is a 81 y.o. F with hx follicular lymphoma stage IIIa s/p chemotherapy with pancytopenia, dCHF, cAF on Eliquis, COVID last Feb with chronic cough, NOT on home O2, hypothyroidism, HTN, HLD, stage IIIb CKD who presented with confusion and unwitnessed fall with complaint of left hip pain.   In the ER, patient noted to have hypoxia, new hip fracture.  CTA chest showed no PE but confirmed mild multifocal infiltrates.       7/2: Admitted for hip fracture 7/3: Developed pre-op hypotension due to ABLA requiring 2u PRBCs, stabilised and went to OR in afternoon for ORIF 7/4: Hypotensive again overnight, got 3rd unit PRBCs, started on Levophed briefly, CCM consulted 7/5 transferred out of unit, MBS shows upper esophageal sphincter dysfunction 7/6 GI consulted 7/8 EGD shows very high level esophageal dysfunction, no intervention possible, ENT recommended     Assessment and Plan: * Hip fracture ([place]) S/p ORIF 7/3 by Dr. Alvan Dame Post-op x-rays shows hardware in good position - Consult Orthopedics, appreciate recommendations - 50% partial weight bearing to left - DVT ppx: Resume Eliquis when it becomes clear there are no surgeries planned, SCDs for now  Dysphagia This was an incidental finding.  Based on report from family, seems to have been present for some time.  But was not an initial presenting complaint, nor volunteered by patient/family until noted by nursing to have recurrent choking, coughing.    Subsequent SLP eval and MBS appears to show severe UES dysfunction and pharyngoesophageal dysphagia.  EGD today showed mucosal edema in the upper pharynx, and severe narrowing of the proximal esophagus, cricopharyngeal bar versus extrinsic compression versus upper esophageal stricture.  No dilation  possible with EGD.  They recommend ENT consultation. - Consult ENT - Hold Eliquis for now - Obtain CT of the neck with contrast - Clears only, via teaspoon  Had long discussion with family about goals of care.  Patient living will states she would not want feeding tube, but family does interpretation of her wishes would be that if artificial nutrition via TPN were able to sustain her until a (hopefully reversible) cauyse of her dysphagia could be found, she would want that. - Start TPN - If no reversible cause of dysphagia can be found, would transition to comfort measures  Acute metabolic encephalopathy At baseline, patient lives alone, still drives.  Since her last hospitalzation she has been in rehab, and has had intermittent disorientation, more cognitive processing deficits than normal.  Here, she was somnolent and sluggish on presentation due to sepsis.   During the first two days of hospital stay, before and immediately after surgery, she was oriented and mostly appropriate. Remains delirious - Delirium precautions  CAP (community acquired pneumonia) Agree, more likely is just viral LRTI  Hypocalcemia Supplemented  Myocardial injury Due to sepsis, hypotension, no angina or signs of ischemia.  UTI (urinary tract infection) Completed course of antibiotics.  Acute blood loss anemia Hgb range has been 8-12 over the last 6 months, recently more 8-10.  On admission, Hgb <7 g/dL. Transfused 3 units this hospitalization.   Stable since surgery.  No clinical bleeding - Trend Hgb   Acute on chronic respiratory failure with hypoxia (HCC) Here, she had new infiltrates, has had hypoxia to the mid-80s, respiratory rates in  the 30s and requires 4L O2 to maintain O2 sats up from her recent baseline 3L.  At present suspect this is more atypical pneumonia than CHF  Now mostly resolved back to basleine o2 needs  Hypomagnesemia Supplemented and resolved  Hyponatremia Due to  hypovolemia, stable, clinically insignificant - Hold furosemide  Atrial fibrillation, chronic (HCC) HR controlled today - Hold Eliquis for now - Continue amiodarone and metroprolol  Chronic diastolic CHF (congestive heart failure) (HCC) Appears euvolemic to dry - Hold Lasix    Acquired hypercoagulable state ([place]) Due to Afib    Pulmonary hypertension (HCC) - Hold furosemide for now  Leukopenia Chronic and intermittent, stable  Essential hypertension BP low normal - Hold furosemide - Continue amiodarone, metoprolol   Hypokalemia Supplemented and resolved  Sepsis ([place]) Presented with tachycardia, leukopenia, encephalopathy, worsening respiratory failure in the setting of worsening pulmonary infiltrates.  At this time, shock is not present  Urine culture growing citrobacter susc to CTX.  RVP positive for rhinovirus.  Completed 5 days antibiotics. Resolved.  Stage 3a chronic kidney disease ([place]) AKI ruled out. Creatinine stable  Grade 3a follicular lymphoma of lymph nodes of multiple regions ([place]) In remission on rituximab maintenance, last dose Jan 2023  Hypothyroidism TSH 2 months ago normal - Continue levothyroxine - Recheck TSH          Subjective: Patient is weak and tired, she has no fever, headache, respiratory distress, abdominal pain, chest pain.  She is intermittently confused.  She is severely weak.     Physical Exam: Vitals:   05/16/2022 1010 05/02/2022 1020 05/03/2022 1030 05/17/2022 1053  BP: 99/73 105/68 96/69 (!) 151/90  Pulse: 97 88 88 94  Resp: (!) 27 (!) 28 (!) 28 18  Temp:    97.7 F (36.5 C)  TempSrc:    Axillary  SpO2: 100% 100% 98% 95%  Weight:      Height:       Elderly adult female, lying flat in bed, weak and tired, appears chronically debilitated Tachycardic, irregular, no murmurs, mild nonpitting lower extremity edema Respiratory rate normal, lungs clear without rales or wheezes Abdomen soft without tenderness palpation or  guarding, no ascites or distention The left hip wound appears clean dry and intact Attention is diminished, affect blunted, she is oriented to self, "hospital", but other recall and short-term memory appear impaired, she moves upper extremities with generalized weakness but symmetric strength, speech fluent, face symmetric    Data Reviewed: Basic metabolic panel notable for potassium of 5.1, creatinine 0.9, no change Sodium 132 Hemogram notable for white blood cell count 1.7, no change, hemoglobin 9.3, no change  Family Communication: Son and daughter in law at the bedside    Disposition: Status is: Inpatient The patient was admitted for hip fracture.  She was treated for possible concurrent sepsis.  Unfortunately while she was here it became obvious that she has had a decline due to dysphagia which seems to be a difficult to manage upper esophageal dysphagia.  GI did an upper endoscopy today, unable to offer any treatments, they recommend ENT and palliative care consultations        Author: Edwin Dada, MD 05/04/2022 1:01 PM  For on call review [email or web address].

## 2022-04-24 NOTE — Progress Notes (Signed)
PHARMACY - TOTAL PARENTERAL NUTRITION CONSULT NOTE   Indication: High grade upper esophageal stenosis, GI state that NG tube and enteral feeding are contraindicated, has had prolonged decreased intake  Patient Measurements: Height: '5\' 1"'$  (154.9 cm) Weight: 59 kg (130 lb 1.1 oz) IBW/kg (Calculated) : 47.8 TPN AdjBW (KG): 59 Body mass index is 24.58 kg/m. Usual Weight: 63.6 kg (03/2021)  Assessment:  Pharmacy TPN consult for this 81 yo F with high grade upper esophageal stenosis.  GI state that NG tube and enteral feeding are contraindicated and patient has had prolonged decreased intake.  Per attending's note, long discussion with family about goals of care.  Patient living will states she would not want feeding tube, but family does interpret her wishes would be that if artificial nutrition via TPN were able to sustain her until a (hopefully reversible) cauyse of her dysphagia could be found, she would want that. - Start TPN - If no reversible cause of dysphagia can be found, would transition to comfort measures  Consult received at cutoff with no updated labs at that time.  MD ok to start TPN 7/9 and start D5NS now  Glucose / Insulin: no history of diabetes Hyperglycemic prior to initiation of TPN Electrolytes: K 5.1, phos WNL, magnesium slightly low at 1.9 Renal: WNL Hepatic: labs ordered for tomorrow Intake / Output; MIVF: Poor appetite, D5NS at 100 ml/hr, Strict I&O ordered GI Imaging:  7/6 SLP eval and MBS appears to show severe UES dysfunction and pharyngoesophageal dysphagia GI Surgeries / Procedures:  7/8 EGD shows very high level esophageal dysfunction, no intervention possible, ENT recommended 7/8 EGD 7/9 Rigid esophagoscopy planned  Central access: has implanted port TPN start date: 7/9  Nutritional Goals: Goal TPN rate is 70 mL/hr (provides 84 g of protein and 1613 kcals per day)  RD Assessment: Estimated Needs Total Energy Estimated Needs: 1500-1700 Total  Protein Estimated Needs: 75-90g Total Fluid Estimated Needs: >/=1.5L  Current Nutrition:  Currently clear liquid, NPO starting after MN TPN to start 7/9  Plan:  Thiamine 100 mg IV x 1 (per RD recommendation) today Magnesium 2 g IV x 1 today TPN to start 7/9 F/U TPN labs on 7/9 Initiate Sensitive q4h SSI and adjust as needed  Monitor TPN labs tomorrow and on Mon/Thurs  Thank you for allowing pharmacy to be a part of this patient's care.  Royetta Asal, PharmD, BCPS Clinical Pharmacist [place] Please utilize Amion for appropriate phone number to reach the unit pharmacist ([place]) 05/15/2022 3:17 PM

## 2022-04-24 NOTE — Plan of Care (Signed)
  Problem: Education: Goal: Knowledge of General Education information will improve Description: Including pain rating scale, medication(s)/side effects and non-pharmacologic comfort measures Outcome: Progressing   Problem: Pain Managment: Goal: General experience of comfort will improve Outcome: Progressing   Problem: Safety: Goal: Ability to remain free from injury will improve Outcome: Progressing   

## 2022-04-24 NOTE — Consult Note (Addendum)
Reason for Consult: Dysphagia Referring Physician: Hospitalist  Carrie [name] is an 81 y.o. female.  HPI: 81 year old female admitted 7/2 due to unwitnessed fall and resultant hip fracture for which she underwent surgical management.  Afterwards, significant dysphagia was noted by nursing staff.  She has had some difficulty swallowing for the past couple of months corresponding to a significant decline in general function.  She has underwent modified barium swallow by SLP demonstrating narrowing at the cricopharygeal region.  Upper endoscopy was performed today by GI who had difficulty passing through the upper esophagus but visualization at that level was limited and results non-specific.  Neck CT was unremarkable.  Her family has decided to start TPN for time-being.  Past Medical History:  Diagnosis Date   A-fib [facility])    Anemia    pt denies    Atrial fibrillation with rapid ventricular response ([place]) 06/25/2015   Carpal tunnel syndrome, bilateral    pt denies    Cervical spondylosis without myelopathy 10/25/2013   Dental bridge present    DJD (degenerative joint disease)    Dyspnea    sometimes hx of cancer non hodgkins lymphoma    Dysrhythmia    WENT INTO A FIB IN 2017    Elevated troponin 01/24/1855   Follicular lymphoma grade 3a ([place]) 02/03/2015   hx of nonhodgkins lymphoma x 3    Headache    sometimes a headache    History of echocardiogram    Echo 12/16: EF 60-65%, no RWMA, severe LAE   History of nuclear stress test    Myoview 1/17: EF 55%, Normal study. No ischemia or scar.   Hypertension    Hypothyroid    Nausea without vomiting 06/20/2015   Stage III chronic kidney disease ([place]) 07/01/2015   Thrush of mouth and esophagus ([place]) 06/25/2015    Past Surgical History:  Procedure Laterality Date   ABDOMINAL HYSTERECTOMY     APPENDECTOMY     BACK SURGERY     X5-lumbar-fusion   CARDIOVERSION N/A 02/17/2022   Procedure: CARDIOVERSION;  Surgeon: Berniece Salines, DO;  Location: [place]  ENDOSCOPY;  Service: Cardiovascular;  Laterality: N/A;   COLONOSCOPY     DILATION AND CURETTAGE OF UTERUS     INTRAMEDULLARY (IM) NAIL INTERTROCHANTERIC Left 05/10/2022   Procedure: INTRAMEDULLARY (IM) NAIL INTERTROCHANTRIC;  Surgeon: Paralee Cancel, MD;  Location: [facility];  Service: Orthopedics;  Laterality: Left;  Biomet short nail, [place]   LYMPH NODE BIOPSY Right 01/21/2015   Procedure: RIGHT GROIN LYMPH NODE BIOPSY;  Surgeon: Erroll Luna, MD;  Location: [place];  Service: General;  Laterality: Right;   MASS EXCISION Left 08/29/2018   Procedure: EXCISION LEFT BACK  MASS;  Surgeon: Erroll Luna, MD;  Location: [place];  Service: General;  Laterality: Left;   Ovarian cyst resection     patelar tendon transplants     Left/right   PORTACATH PLACEMENT Right 02/13/2015   Procedure: INSERTION PORT-A-CATH WITH ULTRASOUND;  Surgeon: Erroll Luna, MD;  Location: [place];  Service: General;  Laterality: Right;   PORTACATH PLACEMENT N/A 08/29/2018   Procedure: INSERTION PORT-A-CATH WITH ULTRA SOUND ERAS PATHWAY;  Surgeon: Erroll Luna, MD;  Location: [place];  Service: General;  Laterality: N/A;   REVERSE SHOULDER ARTHROPLASTY Left 03/02/2021   Procedure: REVERSE SHOULDER ARTHROPLASTY;  Surgeon: Netta Cedars, MD;  [facility];  Service: Orthopedics;  Laterality: Left;   RIGHT/LEFT HEART CATH AND CORONARY ANGIOGRAPHY N/A 01/15/2022   Procedure: RIGHT/LEFT HEART CATH AND  CORONARY ANGIOGRAPHY;  Surgeon: [name], MD;  Location: [place] CV LAB;  Service: Cardiovascular;  Laterality: N/A;   TOTAL KNEE ARTHROPLASTY  2011   Right   TUBAL LIGATION      Family History  Problem Relation Age of Onset   Cancer Mother        Breast, lung NHL   Cancer Sister        Multiple myeloma    Social History:  reports that she has never smoked. She has never used smokeless tobacco. She reports that she does not currently use alcohol. She reports that she does not use  drugs.  Allergies:  Allergies  Allergen Reactions   Amiodarone Other (See Comments)    "Hyperthyroidism"....BUT, the patient is taking this (2023); Listed as an "allergy" on her paperwork from [facility]    Medications: I have reviewed the patient's current medications.  Results for orders placed or performed during the hospital encounter of 04/28/2022 (from the past 48 hour(s))  CBC     Status: Abnormal   Collection Time:   4:14 AM  Result Value Ref Range   WBC 1.7 (L) 4.0 - 10.5 K/uL   RBC 3.17 (L) 3.87 - 5.11 MIL/uL   Hemoglobin 9.3 (L) 12.0 - 15.0 g/dL   HCT 29.0 (L) 36.0 - 46.0 %   MCV 91.5 80.0 - 100.0 fL   MCH 29.3 26.0 - 34.0 pg   MCHC 32.1 30.0 - 36.0 g/dL   RDW 20.0 (H) 11.5 - 15.5 %   Platelets 199 150 - 400 K/uL   nRBC 0.0 0.0 - 0.2 %    Comment: Performed at [facility], [place] [address], [place], [place] [address]  Basic metabolic panel     Status: Abnormal   Collection Time: 05/05/2022  4:14 AM  Result Value Ref Range   Sodium 135 135 - 145 mmol/L   Potassium 5.1 3.5 - 5.1 mmol/L    Comment: DELTA CHECK NOTED NO VISIBLE HEMOLYSIS    Chloride 105 98 - 111 mmol/L   CO2 20 (L) 22 - 32 mmol/L   Glucose, Bld 182 (H) 70 - 99 mg/dL    Comment: Glucose reference range applies only to samples taken after fasting for at least 8 hours.   BUN 14 8 - 23 mg/dL   Creatinine, Ser 0.88 0.44 - 1.00 mg/dL   Calcium 7.6 (L) 8.9 - 10.3 mg/dL   GFR, Estimated >60 >60 mL/min    Comment: (NOTE) Calculated using the CKD-EPI Creatinine Equation (2021)    Anion gap 10 5 - 15    Comment: Performed at [facility], [place] [address] [place], [place] [address]  Magnesium     Status: None   Collection Time: 04/18/2022  4:14 AM  Result Value Ref Range   Magnesium 1.9 1.7 - 2.4 mg/dL    Comment: Performed at [facility], [place] [address], [place], [place] [address]  Phosphorus     Status: None   Collection Time: 04/20/2022  4:14  AM  Result Value Ref Range   Phosphorus 3.0 2.5 - 4.6 mg/dL    Comment: Performed at [facility], [place] [address], [place],  [address]    CT SOFT TISSUE NECK W CONTRAST  Result Date: 05/12/2022 CLINICAL DATA:  Soft tissue swelling, infection suspected, neck xray done EXAM: CT NECK WITH CONTRAST TECHNIQUE: Multidetector CT imaging of the neck was performed using the standard protocol following the bolus administration  of intravenous contrast. RADIATION DOSE REDUCTION: This exam was performed according to the departmental dose-optimization program which includes automated exposure control, adjustment of the mA and/or kV according to patient size and/or use of iterative reconstruction technique. CONTRAST:  35m OMNIPAQUE IOHEXOL 300 MG/ML  SOLN COMPARISON:  None Available. FINDINGS: Motion limited. Pharynx and larynx: Mild hypoattenuation at the base of tongue and vallecula could be artifactual on this motion limited study. Otherwise, no visible edema or mass lesion with evaluation limited by streak artifact from dental amalgam and motion. Unremarkable epiglottis. Salivary glands: No inflammation, mass, or stone. Thyroid: Normal. Lymph nodes: None enlarged or abnormal density. Vascular: Negative. Limited intracranial: Negative. Visualized orbits: Negative. Mastoids and visualized paranasal sinuses: Air-fluid levels in bilateral sphenoid sinuses with mucosal thickening of the right frontal and left maxillary sinuses. Skeleton: Mild severe multilevel degenerative change. Upper chest: Patchy opacities in bilateral lung apices. IMPRESSION: 1. Mild hypoattenuation at the base of tongue and vallecula could be artifactual on this motion limited study. Given reported ENT consultation, recommend correlation with direct inspection. 2. Patchy opacities in bilateral lung apices. See recent CT of the chest for further evaluation. Electronically Signed   By: FMargaretha SheffieldM.D.   On: 05/15/2022  14:19    Review of Systems  Unable to perform ROS: Mental status change   Blood pressure 120/72, pulse 90, temperature (!) 97.5 F (36.4 C), temperature source Oral, resp. rate 18, height '5\' 1"'  (1.549 m), weight 63 kg, SpO2 97 %. Physical Exam Constitutional:      Appearance: Normal appearance. She is normal weight.     Comments: Sleeping  HENT:     Head: Normocephalic and atraumatic.     Right Ear: External ear normal.     Left Ear: External ear normal.     Nose: Nose normal.     Mouth/Throat:     Mouth: Mucous membranes are moist.     Pharynx: Oropharynx is clear.  Eyes:     Extraocular Movements: Extraocular movements intact.     Conjunctiva/sclera: Conjunctivae normal.     Pupils: Pupils are equal, round, and reactive to light.  Cardiovascular:     Rate and Rhythm: Normal rate.  Pulmonary:     Effort: Pulmonary effort is normal.  Musculoskeletal:     Cervical back: Normal range of motion.  Skin:    General: Skin is warm and dry.     Assessment/Plan: Dysphagia, upper esophageal narrowing  I personally reviewed her neck CT.  I could not see the modified barium swallow images but reviewed the report.  I had a long discussion with her son and recommended proceeding with rigid esophagoscopy with possible dilation and possible Botox injection.  Risks, benefits, and alternatives were discussed and he expressed understanding and agreement.  I recommended placement of a nasogastric feeding tube at that time as a safer alternative to TPN.  The family will consider given her previously expressed wishes not to live on a feeding tube.  The tube could be removed at any time.  I explained that outcome is very difficult to predict.  DMelida Quitter7/05/2022, 4:15 PM

## 2022-04-24 NOTE — Plan of Care (Signed)
  Problem: Clinical Measurements: Goal: Respiratory complications will improve Outcome: Progressing   Problem: Clinical Measurements: Goal: Cardiovascular complication will be avoided Outcome: Progressing   Problem: Clinical Measurements: Goal: Diagnostic test results will improve Outcome: Progressing   Problem: Pain Managment: Goal: General experience of comfort will improve Outcome: Progressing

## 2022-04-24 NOTE — Interval H&P Note (Signed)
History and Physical Interval Note:  05/04/2022 9:35 AM  Carrie [name]  has presented today for surgery, with the diagnosis of dysphagia, weightloss.  The various methods of treatment have been discussed with the patient and family. After consideration of risks, benefits and other options for treatment, the patient has consented to  Procedure(s): ESOPHAGOGASTRODUODENOSCOPY with possible dilation (EGD) (N/A) as a surgical intervention.  The patient's history has been reviewed, patient examined, no change in status, stable for surgery.  I have reviewed the patient's chart and labs.  Questions were answered to the patient's satisfaction.     Landry Dyke

## 2022-04-24 NOTE — Progress Notes (Signed)
[facility] called to arrange for pt transport tomorrow am around 8:15. Pt needs to be to OR by 0900 per Dr. Loleta Books.

## 2022-04-24 NOTE — Anesthesia Procedure Notes (Signed)
Procedure Name: MAC Date/Time: 05/13/2022 9:42 AM  Performed by: Claudia Desanctis, CRNAPre-anesthesia Checklist: Patient identified, Emergency Drugs available, Suction available and Patient being monitored Patient Re-evaluated:Patient Re-evaluated prior to induction Oxygen Delivery Method: Simple face mask

## 2022-04-24 NOTE — Progress Notes (Signed)
RN discussed with iv team about port being used for TPN. IV team confirmed that pt will have to be stuck by lab for blood draws once tpn is started. No needs at this time. Pt remains stable.

## 2022-04-24 NOTE — Progress Notes (Signed)
Rn notified md earlier in shift of pt/ family request for no tube feeds or artificial nutrition per pt living will. Md also notified of pt cold hands and feet with mottling purple in color. Pt pulses remain normal. Pt did have a delayed cough with pills crushed in ice cream. Md also notified earlier that pt was periods of tachypnea as well (especially with anxiety). Pt remains stable overall at this time. Family at bedside. Rn will continue to monitor.

## 2022-04-24 NOTE — Transfer of Care (Signed)
Immediate Anesthesia Transfer of Care Note  Patient: Carrie [name]  Procedure(s) Performed: ESOPHAGOGASTRODUODENOSCOPY with possible dilation (EGD)  Patient Location: Endoscopy Unit  Anesthesia Type:MAC  Level of Consciousness: awake and patient cooperative  Airway & Oxygen Therapy: Patient Spontanous Breathing and Patient connected to face mask  Post-op Assessment: Report given to RN and Post -op Vital signs reviewed and stable  Post vital signs: Reviewed and stable  Last Vitals:  Vitals Value Taken Time  BP 99/73 05/13/2022 1010  Temp    Pulse 27 05/15/2022 1010  Resp 29 04/28/2022 1012  SpO2 77 % 04/23/2022 1010  Vitals shown include unvalidated device data.  Last Pain:  Vitals:   04/26/2022 0913  TempSrc: Temporal  PainSc: 0-No pain      Patients Stated Pain Goal: 2 (93/57/01 7793)  Complications: No notable events documented.

## 2022-04-24 NOTE — Progress Notes (Signed)
Pt back from endo in stable condition. Family at bedside.

## 2022-04-24 NOTE — Anesthesia Postprocedure Evaluation (Signed)
Anesthesia Post Note  Patient: Carrie [name]  Procedure(s) Performed: ESOPHAGOGASTRODUODENOSCOPY with possible dilation (EGD)     Patient location during evaluation: Endoscopy Anesthesia Type: MAC Level of consciousness: patient cooperative and awake Pain management: pain level controlled Vital Signs Assessment: post-procedure vital signs reviewed and stable Respiratory status: spontaneous breathing and patient connected to nasal cannula oxygen Cardiovascular status: stable and blood pressure returned to baseline Postop Assessment: no apparent nausea or vomiting Anesthetic complications: no   No notable events documented.  Last Vitals:  Vitals:   04/26/2022 1410 04/20/2022 1844  BP: 120/72 110/89  Pulse: 90 63  Resp: 18 18  Temp:  (!) 36.4 C  SpO2:  100%    Last Pain:  Vitals:   05/04/2022 1844  TempSrc: Oral  PainSc:                  Carrie [name]

## 2022-04-24 NOTE — Progress Notes (Addendum)
Nutrition Follow-up  DOCUMENTATION CODES:   Not applicable; at risk for malnutrition, continue to assess  INTERVENTION:   TPN to start tomorrow (missed cut-off for TPN initiation today); TPN orders per Pharmacy. Recommendations discussed with Pharmacy -Pt is at refeeding risk; monitor electrolytes BID for at least 3 days -Recommend additional IV thiamine 100 mg prior to initiation of TPN with continued doses for 5 days pot initiation -Add phosphorus and magnesium on to previous blood draw from this AM. If low, recommend supplementation prior to initiation of TPN.  -Recommend addition of sliding scale insulin with q 4 hour finger checks with initiation of TPN   NUTRITION DIAGNOSIS:   Inadequate oral intake related to dysphagia, altered GI function as evidenced by per patient/family report, NPO status (sips of CL only).  Being addressed via TPN  GOAL:   Patient will meet greater than or equal to 90% of their needs  Progressing  MONITOR:   Labs, Weight trends (TPN tolerance, POC)  REASON FOR ASSESSMENT:   Consult New TPN/TNA, Enteral/tube feeding initiation and management  ASSESSMENT:   [facility] admitted [facility] after a fall leading to L hip fracture. PMH significant for PAF s/p DCCV 5/3 with ERAF, CHF, follicular lymphoma stage IIIa s/p chemotherapy with pancytopenia, hypothyroidism, HTN, HLD, CKD stage IIIb and chronic cough since recovering from COVID-19 early 2023 on chronic O2.  7/02 Admitted 7/03 ORIF 7/05 MBS showed upper esophageal sphincter dysfunction 7/08 EGD with very high level esophgeal dysfunction, no intervention possible, ENT recommended  NPO at present 7/02-7/04: NPO/CL 7/04-7/06: Heart Healthy diet 7/07: CL 7/08: CL, Nectar Thick 7/09: NPO  No recorded po intake. Per report, pt eating minimally since admission, even when on diet. Per report, pt has had poor po intake x 1 month with associated weight loss. Current wts per weight encounters do not  show wt loss trend. However noted wt of 59 kg and 63 kg both documented today. Pt with non-pitting edema which may also be masking any true wt loss. Not safe for much po at present, CL only via tsp.  Per MD note, family reports dysphagia has been present for some time but was not an initial presenting complaint or discussed by pt/family until RN noted recurrent choking, coughing.   Discussed nutrition poc in detail with Dr. Loleta Books. GI had difficult time placing scope during EGD and required pediatric size scope.  Pt has central access and plan to start TPN today given no enteral access, unable to safely take po and pt likely malnourished given prolonged poor oral intake with wt loss. TPN is appropriate at this time.   Per MD, GI believes NG placement is not feasible. Pt's living will also states she wound not want a feeding tube. Per MD note, Family does interpret her wishes as she would be ok with artificial nutrition via TPN if it could sustain her until cause of her dysphagia could be found with possible treatment. If no reversible cause of dysphagia found then plan to transition to comfort care. Janyce Llanos service not available at [facility]; however, if there is a change and attempt at small bore feeding tube is desired, recommend Radiology Consult for placement under fluoroscopy with small french size tube  Noted no CBGs, serum glucose 182. No sliding scale ordered.   Labs: potassium 5.1, Creatinine wdl, no phosphorus or magnesium, no CBGs Meds: D5-NS at 100 ml/hr   Diet Order:   Diet Order  Diet NPO time specified  Diet effective midnight           Diet clear liquid Room service appropriate? Yes; Fluid consistency: Nectar Thick  Diet effective now                   EDUCATION NEEDS:   No education needs have been identified at this time  Skin:  Skin Assessment: Reviewed RN Assessment  Last BM:  7/3 (type 7)  Height:   Ht Readings from Last 1 Encounters:   05/15/2022 '5\' 1"'$  (1.549 m)    Weight:   Wt Readings from Last 1 Encounters:  05/03/2022 63 kg     BMI:  Body mass index is 26.24 kg/m.  Estimated Nutritional Needs:   Kcal:  1500-1700  Protein:  75-90g  Fluid:  >/=1.5L   Kerman Passey MS, RDN, LDN, CNSC Registered Dietitian 3 Clinical Nutrition RD Pager and On-Call Pager Number Located in [place]

## 2022-04-24 NOTE — Progress Notes (Addendum)
OR at [facility] called to seek clarification of time for pt to go to [facility] tomorrow for surgery tomorrow. They stated they want the pt to short stay by 0700 for prep for the procedure at 0900. They want the family to report to [facility] tower 2nd floor waiting room to wait for doctor. [facility] called to change time after this conversation.

## 2022-04-24 NOTE — Op Note (Signed)
[facility] Patient Name: Carrie [name] Procedure Date: 04/22/2022 MRN: [id number] Attending MD: Arta Silence , MD Date of Birth: 11-12-1940 CSN: [id number] Age: 81 Admit Type: Inpatient Procedure:                Upper GI endoscopy Indications:              Dysphagia Providers:                Arta Silence, MD, Jeanella Cara, RN,                            Ladona Ridgel, Technician, Dellie Catholic Referring MD:             [facility] Medicines:                Monitored Anesthesia Care Complications:            No immediate complications. Estimated Blood Loss:     Estimated blood loss: none. Procedure:                Pre-Anesthesia Assessment:                           - Prior to the procedure, a History and Physical                            was performed, and patient medications and                            allergies were reviewed. The patient's tolerance of                            previous anesthesia was also reviewed. The risks                            and benefits of the procedure and the sedation                            options and risks were discussed with the patient.                            All questions were answered, and informed consent                            was obtained. Prior Anticoagulants: The patient has                            taken Eliquis (apixaban), last dose was 2 days                            prior to procedure. ASA Grade Assessment: IV - A                            patient with severe systemic disease that is a  constant threat to life. After reviewing the risks                            and benefits, the patient was deemed in                            satisfactory condition to undergo the procedure.                           After obtaining informed consent, the endoscope was                            passed under direct vision. Throughout the                             procedure, the patient's blood pressure, pulse, and                            oxygen saturations were monitored continuously. The                            [id number]-H190 ([id number]) Olympus endoscope was introduced                            through the mouth, and advanced to the second part                            of duodenum. The upper GI endoscopy was                            accomplished without difficulty. The patient                            tolerated the procedure well. Scope In: Scope Out: Findings:      Diffuse moderate mucosal changes characterized by congestion were found       at the cricopharyngeus + upper esophageal sphincter. No mass lesion       appreciated. Resistance to passage of standard endoscope from both       arytenoid folds. Scopes exchanged for pediatric endoscope. Scope was       able to traverse upper esophageal sphincter. Possible subtle esophageal       web immediately distal to UES, but this was distal to the site of edema       where there was inability to pass diagnostic endoscope.      The exam of the esophagus was otherwise normal.      The entire examined stomach was normal.      The duodenal bulb, first portion of the duodenum and second portion of       the duodenum were normal. Impression:               - Congested mucosa in the cricopharyngeus + upper                            esophageal sphincter, creating mechanical  impairment of diagnostic endoscope passage.                            Cricopharyngeal bar versus UES stenosis (UES                            achalasia) versus extrinsic compression versus                            other.                           - Possible subtle web proximal esophagus, distal to                            site of resistance to scope passage.                           - Normal stomach.                           - Normal duodenal bulb, first portion of the                             duodenum and second portion of the duodenum.                           - No specimens collected. Moderate Sedation:      None Recommendation:           - Return patient to hospital ward for ongoing care.                           - Pending clinical course, could consider ENT                            consultation for consideration of UES botox versus                            UES dilatation. This would likely have to be done                            under general anesthesia, and impact of doing these                            procedure(s) on patient's clinical course is                            uncertain.                           - Nothing else unfortunately can be done from GI                            perspective.                           -  Prognosis guarded.                           [facility] will sign off; please call with                            questions; thank you for the consultation. Procedure Code(s):        --- Professional ---                           [phone], Esophagogastroduodenoscopy, flexible,                            transoral; diagnostic, including collection of                            specimen(s) by brushing or washing, when performed                            (separate procedure) Diagnosis Code(s):        --- Professional ---                           K22.8, Other specified diseases of esophagus                           R13.10, Dysphagia, unspecified CPT copyright 2019 American Medical Association. All rights reserved. The codes documented in this report are preliminary and upon coder review may  be revised to meet current compliance requirements. Arta Silence, MD 04/27/2022 10:14:58 AM This report has been signed electronically. Number of Addenda: 0

## 2022-04-24 NOTE — Anesthesia Preprocedure Evaluation (Addendum)
Anesthesia Evaluation  Patient identified by MRN, date of birth, ID band Patient awake    Reviewed: Allergy & Precautions, NPO status , Patient's Chart, lab work & pertinent test results  History of Anesthesia Complications Negative for: history of anesthetic complications  Airway Mallampati: III  TM Distance: >3 FB Neck ROM: Full    Dental  (+) Dental Advisory Given   Pulmonary shortness of breath,    breath sounds clear to auscultation       Cardiovascular hypertension, Pt. on medications and Pt. on home beta blockers + CAD and +CHF  + dysrhythmias  Rhythm:Irregular  1. Inferior basal hypokinesis . Left ventricular ejection fraction, by  estimation, is 50 to 55%. The left ventricle has low normal function. The  left ventricle demonstrates regional wall motion abnormalities (see  scoring diagram/findings for  description). Left ventricular diastolic parameters were normal.  2. No morphologic signs of pulmonary HTN Estimated PA systolic pressure  only mildly elevated 38 mmHg . Right ventricular systolic function is  normal. The right ventricular size is normal. There is normal pulmonary  artery systolic pressure.  3. Left atrial size was mildly dilated.  4. The mitral valve is abnormal. Mild mitral valve regurgitation. No  evidence of mitral stenosis.  5. Tricuspid valve regurgitation is mild to moderate.  6. The aortic valve is normal in structure. There is moderate  calcification of the aortic valve. Aortic valve regurgitation is mild.  Aortic valve sclerosis/calcification is present, without any evidence of  aortic stenosis.  7. The inferior vena cava is normal in size with greater than 50%  respiratory variability, suggesting right atrial pressure of 3 mmHg.    Marland Kitchen  Prox RCA lesion is 15% stenosed. .  LV end diastolic pressure is normal. .  The left ventricular ejection fraction is 50-55% by visual estimate.  1.   Mild obstructive coronary artery disease. 2.  Normal cardiac output and index with mean right atrial pressure of 4 mmHg and wedge pressure of 11 mmHg. 3.  Rapid atrial fibrillation.  Recommendation: Medical therapy.  If the patient's rate over the next several hours is not improved we will admit the patient for inpatient management of rapid atrial fibrillation.    Neuro/Psych  Headaches,  Neuromuscular disease    GI/Hepatic   Endo/Other  diabetesHypothyroidism   Renal/GU CRFRenal diseaseLab Results      Component                Value               Date                      CREATININE               0.88                05/07/2022           Lab Results      Component                Value               Date                      K                        5.1  05/15/2022                Musculoskeletal  (+) Arthritis ,   Abdominal   Peds  Hematology  (+) Blood dyscrasia, anemia , Lab Results      Component                Value               Date                      WBC                      1.7 (L)             05/11/2022                HGB                      9.3 (L)             05/08/2022                HCT                      29.0 (L)            05/08/2022                MCV                      91.5                04/23/2022                PLT                      199                 05/08/2022            eliquis    Anesthesia Other Findings   Reproductive/Obstetrics                            Anesthesia Physical Anesthesia Plan  ASA: 4  Anesthesia Plan: MAC   Post-op Pain Management: Minimal or no pain anticipated   Induction: Intravenous  PONV Risk Score and Plan: 2 and Propofol infusion and Treatment may vary due to age or medical condition  Airway Management Planned: Nasal Cannula and Natural Airway  Additional Equipment:   Intra-op Plan:   Post-operative Plan:   Informed Consent: I have reviewed the patients  History and Physical, chart, labs and discussed the procedure including the risks, benefits and alternatives for the proposed anesthesia with the patient or authorized representative who has indicated his/her understanding and acceptance.   Patient has DNR.  Discussed DNR with power of attorney and Continue DNR.   Dental advisory given and Consent reviewed with POA  Plan Discussed with: CRNA  Anesthesia Plan Comments:        Anesthesia Quick Evaluation

## 2022-04-25 ENCOUNTER — Inpatient Hospital Stay (HOSPITAL_COMMUNITY): Payer: No Typology Code available for payment source | Admitting: Anesthesiology

## 2022-04-25 ENCOUNTER — Encounter (HOSPITAL_COMMUNITY): Payer: Self-pay | Admitting: Internal Medicine

## 2022-04-25 ENCOUNTER — Encounter (HOSPITAL_COMMUNITY): Admission: EM | Disposition: E | Payer: Self-pay | Source: Skilled Nursing Facility | Attending: Family Medicine

## 2022-04-25 ENCOUNTER — Inpatient Hospital Stay (HOSPITAL_COMMUNITY): Payer: No Typology Code available for payment source

## 2022-04-25 DIAGNOSIS — E039 Hypothyroidism, unspecified: Secondary | ICD-10-CM | POA: Diagnosis not present

## 2022-04-25 DIAGNOSIS — I509 Heart failure, unspecified: Secondary | ICD-10-CM

## 2022-04-25 DIAGNOSIS — I110 Hypertensive heart disease with heart failure: Secondary | ICD-10-CM | POA: Diagnosis not present

## 2022-04-25 DIAGNOSIS — K222 Esophageal obstruction: Secondary | ICD-10-CM

## 2022-04-25 DIAGNOSIS — G9341 Metabolic encephalopathy: Secondary | ICD-10-CM | POA: Diagnosis not present

## 2022-04-25 DIAGNOSIS — S72002A Fracture of unspecified part of neck of left femur, initial encounter for closed fracture: Secondary | ICD-10-CM | POA: Diagnosis not present

## 2022-04-25 DIAGNOSIS — R7401 Elevation of levels of liver transaminase levels: Secondary | ICD-10-CM

## 2022-04-25 DIAGNOSIS — D62 Acute posthemorrhagic anemia: Secondary | ICD-10-CM | POA: Diagnosis not present

## 2022-04-25 DIAGNOSIS — D6869 Other thrombophilia: Secondary | ICD-10-CM | POA: Diagnosis not present

## 2022-04-25 HISTORY — PX: ESOPHAGOSCOPY W/ BOTOX INJECTION: SHX1533

## 2022-04-25 LAB — COMPREHENSIVE METABOLIC PANEL
ALT: 328 U/L — ABNORMAL HIGH (ref 0–44)
AST: 837 U/L — ABNORMAL HIGH (ref 15–41)
Albumin: 1.9 g/dL — ABNORMAL LOW (ref 3.5–5.0)
Alkaline Phosphatase: 180 U/L — ABNORMAL HIGH (ref 38–126)
Anion gap: 9 (ref 5–15)
BUN: 18 mg/dL (ref 8–23)
CO2: 19 mmol/L — ABNORMAL LOW (ref 22–32)
Calcium: 7.8 mg/dL — ABNORMAL LOW (ref 8.9–10.3)
Chloride: 109 mmol/L (ref 98–111)
Creatinine, Ser: 1.15 mg/dL — ABNORMAL HIGH (ref 0.44–1.00)
GFR, Estimated: 48 mL/min — ABNORMAL LOW (ref >60)
Glucose, Bld: 186 mg/dL — ABNORMAL HIGH (ref 70–99)
Potassium: 4.6 mmol/L (ref 3.5–5.1)
Sodium: 137 mmol/L (ref 135–145)
Total Bilirubin: 1.4 mg/dL — ABNORMAL HIGH (ref 0.3–1.2)
Total Protein: 4.1 g/dL — ABNORMAL LOW (ref 6.5–8.1)

## 2022-04-25 LAB — GLUCOSE, CAPILLARY
Glucose-Capillary: 115 mg/dL — ABNORMAL HIGH (ref 70–99)
Glucose-Capillary: 183 mg/dL — ABNORMAL HIGH (ref 70–99)
Glucose-Capillary: 205 mg/dL — ABNORMAL HIGH (ref 70–99)
Glucose-Capillary: 76 mg/dL (ref 70–99)
Glucose-Capillary: 97 mg/dL (ref 70–99)

## 2022-04-25 LAB — MAGNESIUM: Magnesium: 2.2 mg/dL (ref 1.7–2.4)

## 2022-04-25 LAB — PHOSPHORUS: Phosphorus: 3.2 mg/dL (ref 2.5–4.6)

## 2022-04-25 LAB — TRIGLYCERIDES: Triglycerides: 98 mg/dL (ref <150)

## 2022-04-25 LAB — TSH: TSH: 8.334 u[IU]/mL — ABNORMAL HIGH (ref 0.350–4.500)

## 2022-04-25 SURGERY — EGD, WITH BOTULINUM TOXIN INJECTION
Anesthesia: General

## 2022-04-25 MED ORDER — SODIUM CHLORIDE (PF) 0.9 % IJ SOLN
INTRAMUSCULAR | Status: DC | PRN
  Administered 2022-04-25: 1 mL

## 2022-04-25 MED ORDER — SODIUM CHLORIDE (PF) 0.9 % IJ SOLN
INTRAMUSCULAR | Status: AC
  Filled 2022-04-25: qty 10

## 2022-04-25 MED ORDER — ONDANSETRON HCL 4 MG/2ML IJ SOLN: 4.0000 mg | Freq: Once | INTRAMUSCULAR | Status: DC | PRN

## 2022-04-25 MED ORDER — FENTANYL CITRATE (PF) 250 MCG/5ML IJ SOLN
INTRAMUSCULAR | Status: AC
  Filled 2022-04-25: qty 5

## 2022-04-25 MED ORDER — SODIUM CHLORIDE 0.9 % IV SOLN
2.0000 g | Freq: Every day | INTRAVENOUS | Status: DC
  Administered 2022-04-25 – 2022-04-26 (×2): 2 g via INTRAVENOUS
  Filled 2022-04-25 (×3): qty 20

## 2022-04-25 MED ORDER — CHLORHEXIDINE GLUCONATE 0.12 % MT SOLN
OROMUCOSAL | Status: AC
  Administered 2022-04-25: 15 mL via OROMUCOSAL
  Filled 2022-04-25: qty 15

## 2022-04-25 MED ORDER — PROPOFOL 10 MG/ML IV BOLUS
INTRAVENOUS | Status: DC | PRN
  Administered 2022-04-25: 100 mg via INTRAVENOUS

## 2022-04-25 MED ORDER — TRAVASOL 10 % IV SOLN
INTRAVENOUS | Status: DC
  Filled 2022-04-25: qty 480

## 2022-04-25 MED ORDER — CHLORHEXIDINE GLUCONATE 0.12 % MT SOLN: 15.0000 mL | Freq: Once | OROMUCOSAL | Status: AC

## 2022-04-25 MED ORDER — EPHEDRINE SULFATE-NACL 50-0.9 MG/10ML-% IV SOSY
PREFILLED_SYRINGE | INTRAVENOUS | Status: DC | PRN
  Administered 2022-04-25: 10 mg via INTRAVENOUS

## 2022-04-25 MED ORDER — PROPOFOL 10 MG/ML IV BOLUS
INTRAVENOUS | Status: AC
  Filled 2022-04-25: qty 20

## 2022-04-25 MED ORDER — NALOXONE HCL 0.4 MG/ML IJ SOLN
INTRAMUSCULAR | Status: AC
  Filled 2022-04-25: qty 1

## 2022-04-25 MED ORDER — VASOPRESSIN 20 UNIT/ML IV SOLN
INTRAVENOUS | Status: DC | PRN
  Administered 2022-04-25: 2 [IU] via INTRAVENOUS
  Administered 2022-04-25 (×2): 1 [IU] via INTRAVENOUS
  Administered 2022-04-25 (×3): 2 [IU] via INTRAVENOUS

## 2022-04-25 MED ORDER — FENTANYL CITRATE (PF) 100 MCG/2ML IJ SOLN: 25.0000 ug | INTRAMUSCULAR | Status: DC | PRN

## 2022-04-25 MED ORDER — LIDOCAINE 2% (20 MG/ML) 5 ML SYRINGE
INTRAMUSCULAR | Status: DC | PRN
  Administered 2022-04-25 (×2): 40 mg via INTRAVENOUS
  Administered 2022-04-25: 60 mg via INTRAVENOUS

## 2022-04-25 MED ORDER — LACTATED RINGERS IV SOLN: INTRAVENOUS | Status: DC

## 2022-04-25 MED ORDER — PHENYLEPHRINE 80 MCG/ML (10ML) SYRINGE FOR IV PUSH (FOR BLOOD PRESSURE SUPPORT)
PREFILLED_SYRINGE | INTRAVENOUS | Status: DC | PRN
  Administered 2022-04-25 (×2): 160 ug via INTRAVENOUS
  Administered 2022-04-25: 480 ug via INTRAVENOUS
  Administered 2022-04-25: 160 ug via INTRAVENOUS
  Administered 2022-04-25: 240 ug via INTRAVENOUS
  Administered 2022-04-25: 320 ug via INTRAVENOUS

## 2022-04-25 MED ORDER — METOPROLOL TARTRATE 5 MG/5ML IV SOLN
2.5000 mg | INTRAVENOUS | Status: DC | PRN
  Administered 2022-04-25: 2.5 mg via INTRAVENOUS

## 2022-04-25 MED ORDER — SUGAMMADEX SODIUM 200 MG/2ML IV SOLN
INTRAVENOUS | Status: DC | PRN
  Administered 2022-04-25: 200 mg via INTRAVENOUS

## 2022-04-25 MED ORDER — EPINEPHRINE HCL (NASAL) 0.1 % NA SOLN
NASAL | Status: AC
  Filled 2022-04-25: qty 30

## 2022-04-25 MED ORDER — AMISULPRIDE (ANTIEMETIC) 5 MG/2ML IV SOLN: 10.0000 mg | Freq: Once | INTRAVENOUS | Status: DC | PRN

## 2022-04-25 MED ORDER — APIXABAN 2.5 MG PO TABS
2.5000 mg | ORAL_TABLET | Freq: Two times a day (BID) | ORAL | Status: DC
  Administered 2022-04-26: 2.5 mg via ORAL
  Filled 2022-04-25: qty 1

## 2022-04-25 MED ORDER — DEXTROSE-NACL 5-0.9 % IV SOLN: INTRAVENOUS | Status: DC

## 2022-04-25 MED ORDER — ORAL CARE MOUTH RINSE: 15.0000 mL | Freq: Once | OROMUCOSAL | Status: AC

## 2022-04-25 MED ORDER — OSMOLITE 1.5 CAL PO LIQD
1000.0000 mL | ORAL | Status: DC
  Administered 2022-04-25: 1000 mL
  Filled 2022-04-25: qty 1000

## 2022-04-25 MED ORDER — METRONIDAZOLE 500 MG/100ML IV SOLN
500.0000 mg | Freq: Two times a day (BID) | INTRAVENOUS | Status: DC
  Administered 2022-04-25 – 2022-04-26 (×3): 500 mg via INTRAVENOUS
  Filled 2022-04-25 (×4): qty 100

## 2022-04-25 MED ORDER — VASOPRESSIN 20 UNIT/ML IV SOLN
INTRAVENOUS | Status: AC
  Filled 2022-04-25: qty 1

## 2022-04-25 MED ORDER — FENTANYL CITRATE (PF) 250 MCG/5ML IJ SOLN
INTRAMUSCULAR | Status: DC | PRN
  Administered 2022-04-25: 100 ug via INTRAVENOUS

## 2022-04-25 MED ORDER — ROCURONIUM BROMIDE 10 MG/ML (PF) SYRINGE
PREFILLED_SYRINGE | INTRAVENOUS | Status: DC | PRN
  Administered 2022-04-25: 30 mg via INTRAVENOUS

## 2022-04-25 MED ORDER — ACETAMINOPHEN 10 MG/ML IV SOLN: 1000.0000 mg | Freq: Once | INTRAVENOUS | Status: DC | PRN

## 2022-04-25 MED ORDER — ONABOTULINUMTOXINA 100 UNITS IJ SOLR
INTRAMUSCULAR | Status: DC | PRN
  Administered 2022-04-25: 30 [IU] via INTRAMUSCULAR

## 2022-04-25 MED ORDER — EPINEPHRINE PF 1 MG/ML IJ SOLN
INTRAMUSCULAR | Status: AC
  Filled 2022-04-25: qty 2

## 2022-04-25 MED ORDER — ONDANSETRON HCL 4 MG/2ML IJ SOLN
INTRAMUSCULAR | Status: DC | PRN
  Administered 2022-04-25: 4 mg via INTRAVENOUS

## 2022-04-25 MED ORDER — METOPROLOL TARTRATE 5 MG/5ML IV SOLN
INTRAVENOUS | Status: AC
  Filled 2022-04-25: qty 5

## 2022-04-25 SURGICAL SUPPLY — 23 items
CANISTER SUCT 3000ML PPV (MISCELLANEOUS) ×3 IMPLANT
CATH ROBINSON RED A/P 12FR (CATHETERS) ×1 IMPLANT
COVER BACK TABLE 60X90IN (DRAPES) ×3 IMPLANT
COVER MAYO STAND STRL (DRAPES) ×3 IMPLANT
DRAPE HALF SHEET 40X57 (DRAPES) ×3 IMPLANT
GLOVE BIO SURGEON STRL SZ7.5 (GLOVE) ×3 IMPLANT
GOWN STRL REUS W/ TWL LRG LVL3 (GOWN DISPOSABLE) ×4 IMPLANT
GOWN STRL REUS W/TWL LRG LVL3 (GOWN DISPOSABLE) ×4
KIT BASIN OR (CUSTOM PROCEDURE TRAY) ×3 IMPLANT
KIT TURNOVER KIT B (KITS) ×3 IMPLANT
NS IRRIG 1000ML POUR BTL (IV SOLUTION) ×3 IMPLANT
PAD ARMBOARD 7.5X6 YLW CONV (MISCELLANEOUS) ×6 IMPLANT
POSITIONER HEAD DONUT 9IN (MISCELLANEOUS) ×1 IMPLANT
SET COLLECT BLD 25X3/4 12 (NEEDLE) ×1 IMPLANT
SOL ANTI FOG 6CC (MISCELLANEOUS) ×2 IMPLANT
SOLUTION ANTI FOG 6CC (MISCELLANEOUS) ×1
SURGILUBE 2OZ TUBE FLIPTOP (MISCELLANEOUS) ×3 IMPLANT
SYR TB 1ML LUER SLIP (SYRINGE) ×1 IMPLANT
TAPE UMBILICAL 1/8X30 (MISCELLANEOUS) ×1 IMPLANT
TOWEL GREEN STERILE FF (TOWEL DISPOSABLE) ×3 IMPLANT
TUBE CONNECTING 12X1/4 (SUCTIONS) ×3 IMPLANT
TUBE FEEDING 10FR FLEXIFLO (MISCELLANEOUS) ×1 IMPLANT
TUBING EXTENTION W/L.L. (IV SETS) ×3 IMPLANT

## 2022-04-25 NOTE — Progress Notes (Signed)
Glasses retrieved from PACU, returned to patient prior to transport with [facility]

## 2022-04-25 NOTE — Progress Notes (Signed)
Patient ID: Carrie [name], female   DOB: [birth year], 81 y.o.   MRN: [id number]  No changes since yesterday.  Proceed with esophagoscopy with possible dilation and possible Botox injection.

## 2022-04-25 NOTE — Anesthesia Postprocedure Evaluation (Signed)
Anesthesia Post Note  Patient: Carrie [name]  Procedure(s) Performed: RIGID ESOPHAGOSCOPY WITH DILATION AND BOTOX INJECTION     Patient location during evaluation: PACU Anesthesia Type: General Level of consciousness: awake Pain management: pain level controlled Vital Signs Assessment: post-procedure vital signs reviewed and stable Respiratory status: spontaneous breathing, nonlabored ventilation, respiratory function stable and patient connected to nasal cannula oxygen Cardiovascular status: blood pressure returned to baseline and stable Postop Assessment: no apparent nausea or vomiting Anesthetic complications: no   No notable events documented.  Last Vitals:  Vitals:   04/17/2022 1230 05/10/2022 1300  BP: 105/80 (!) 116/91  Pulse:  84  Resp: 13 14  Temp:    SpO2: 100% 96%    Last Pain:  Vitals:   04/30/2022 1045  TempSrc:   PainSc: Asleep                 Olsen Mccutchan P Jaylyne Breese

## 2022-04-25 NOTE — Op Note (Signed)
Preop diagnosis: Upper esophageal narrowing Postop diagnosis: same Procedure: Rigid esophagoscopy with dilation and Botox injection Surgeon: Redmond Baseman Anesth: General Compl: None Findings: Tightness of the upper esophageal sphincter without mas or mucosal abnormality.  Upper esophageal sphincter dilated to 40 Fr dilator and rigid esophagoscope then able to pass.  30 units of Botox injected into UES.  Feeding tube placed. Description:  After discussing risks, benefits, and alternatives, the patient was brought to the operating room and placed on the operative table in the supine position.  Anesthesia was induced and the patient was intubated by the anesthesia team without difficulty.  The eyes were taped closed and the bed was turned 90 degrees from anesthesia.  A tooth guard was placed over the upper teeth.  Attempts were made to pass a Pilling rigid cervical esophagoscope and then more narrow esophagoscope but neither would pass the UES.  The upper esophagus was then dilated blindly using [facility] dilators, serially from a 28 Fr to a 40 Fr.  Then, the more narrow esophagoscope was easily passed followed by the cervical esophagoscope.  There was only minimal mucosal tear of posterior UES mucosa.  The UES still appeared prominent so a Weerda scope was placed, isolating the UES posteriorly.  Botox was then injected constituted with 1 cc of saline totaling 30 units injected across three locations.  The scope was removed.  A vomer tie was then placed and a Panda feeding tube was placed through the nose and easily down the esophagus.  The tube was tied with the vomer tie.  The tooth guard had been removed. The patient was turned back to anesthesia for wake up and was extubated and moved to the recovery room in stable condition.

## 2022-04-25 NOTE — Plan of Care (Signed)
  Problem: Clinical Measurements: Goal: Diagnostic test results will improve Outcome: Progressing   Problem: Clinical Measurements: Goal: Respiratory complications will improve Outcome: Progressing   Problem: Clinical Measurements: Goal: Cardiovascular complication will be avoided Outcome: Progressing   Problem: Pain Managment: Goal: General experience of comfort will improve Outcome: Progressing   

## 2022-04-25 NOTE — Progress Notes (Signed)
Patient was due to void. Bladder scan completed 135 cc. Will continue to monitor.

## 2022-04-25 NOTE — Progress Notes (Signed)
Pt stable at time of [facility] transport. Report given to [facility] staff and sarah block rn at short stay at [facility]. Pt stable at time of am assessment prior to transport. Harden Mo also told of need for labs to be drawn.

## 2022-04-25 NOTE — Progress Notes (Signed)
Called and spoke with Joelene Millin at [facility] regarding an estimated time of pick up for patient. She stated she had spoken with a nurse earlier and that she has arranged for patient to be transported at 0600 in order to be at [facility] stay by 0700 as requested.  Upon checking she said that the time must not have saved and she had to again put in for the patient to be picked up before shift change and get to [facility] stay by 0700. Before ending the phone call, pickup time was confirmed for between 0600 and 0615.

## 2022-04-25 NOTE — Progress Notes (Addendum)
The patient arrived to the unit, Provider is at bedside. RR RN contacted to assist. Patient is responsive to pain. She responsive to name intermittently. Pt is oriented to self. Cotrak right nare. Bilateral fingers and toes tips cyanotic, cold. Skin is intact, BLE edema 1+, nonpitting. Provider is at bedside. Rectal temp 95.8, Manual BP left arm 100/52, RT 102/58. RR 18, O2 sat rt ear 92-96%. Will continue to monitor.

## 2022-04-25 NOTE — Plan of Care (Signed)
?  Problem: Coping: ?Goal: Level of anxiety will decrease ?Outcome: Progressing ?  ?Problem: Safety: ?Goal: Ability to remain free from injury will improve ?Outcome: Progressing ?  ?

## 2022-04-25 NOTE — Progress Notes (Signed)
Progress Note   Patient: Carrie [name] ZLD:[id number] DOB: 17-Jan-1941 DOA: 05/14/2022     7 DOS: the patient was seen and examined on 05/10/2022 at 2:45PM      Brief hospital course: Carrie [name] is a 81 y.o. F with hx follicular lymphoma stage IIIa s/p chemotherapy with pancytopenia, dCHF, cAF on Eliquis, COVID last Feb with chronic cough, NOT on home O2, hypothyroidism, HTN, HLD, stage IIIb CKD who presented with confusion and unwitnessed fall with complaint of left hip pain.   In the ER, patient noted to have hypoxia, new hip fracture.  CTA chest showed no PE but confirmed mild multifocal infiltrates.       7/2: Admitted for hip fracture 7/3: Developed pre-op hypotension due to ABLA requiring 2u PRBCs, stabilised and went to OR in afternoon for ORIF 7/4: Hypotensive again overnight, got 3rd unit PRBCs, started on Levophed briefly, CCM consulted 7/5 transferred out of unit, MBS shows upper esophageal sphincter dysfunction 7/6 GI consulted 7/8 EGD shows very high level esophageal dysfunction, no intervention possible, CT neck unremarkable, ENT consulted 7/9 ENT performed rigid esophagoscopy and dilation and botox injection fo UES succesfully      Assessment and Plan: * Hip fracture (Carrie [name]) S/p ORIF 7/3 by Dr. Alvan Dame Post-op x-rays shows hardware in good position - Consult Orthopedics, appreciate recommendations - 50% partial weight bearing to left - DVT ppx: Resume Eliquis      Dysphagia This was an incidental finding but has become a chief reason for hospitalization.    Based on report from family, seems to have been present for some time.  But was not an initial presenting complaint, nor volunteered by patient/family until noted by nursing to have recurrent choking, coughing here in the hospital.    Subsequent SLP eval and MBS appeared to show severe UES dysfunction and pharyngoesophageal dysphagia.  EGD 7/8 showed severe narrowing of the proximal esophagus, to the point the  endoscopy could not pass, suspected to be cricopharyngeal bar versus extrinsic compression versus upper esophageal stricture.  No dilation possible with EGD.  They recommended ENT consultation.  CT neck ruled out extrinsic compression.  ENT consulted and performed rigid esophagoscopy today, encountered same stenosis but were able to successfully dilate and then inject Botox in the UES and place NG tube  - Tube feeds for now - Continue SLP tomorrow to try to attempt oral intake Post-dilation - Resume Eliquis     Transaminitis This is another unexpected finding today. Has had AST above the ULN since May, but in the 50-60 range, then LFTs obtained today showed AST/ALT 837/328.  Unclear cause.  Doubt acetaminophen, as she has been in a monitored setting.  Cholangitis seems possible, although no abdominal pain.  General anesthesia seems also possible.  Amiodarone possible. - Hold hepatotoxins - I will elect to continue amiodarone for now, given her ectopy on monitoring - Obtain US abdomen - Resume antibiotics with CTX/flagyl until gallbladder disease/cholangitis can be ruled out - Consult GI, appreciate cares  Acute metabolic encephalopathy At baseline, patient lives alone, still drives.  Since her last hospitalzation she has been in rehab, and has had intermittent disorientation, more cognitive processing deficits than normal.  Here, she was somnolent and sluggish on presentation due to sepsis.   During the first two days of hospital stay, before and immediately after surgery, she was oriented and mostly appropriate. 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 (  [place]) In remission on rituximab maintenance, last dose Jan 2023  Hypothyroidism TSH 2 months ago normal, now slightly high - Continue levothyroxine  Essential hypertension BP low normal - Hold furosemide - Continue amiodarone, metoprolol   Hyponatremia Due  to hypovolemia, stable, clinically insignificant - Hold furosemide  Atrial fibrillation, chronic (HCC) HR controlled today - Hold Eliquis for now - Continue amiodarone and metroprolol  Chronic diastolic CHF (congestive heart failure) (HCC) Appears euvolemic to dry - Hold Lasix         Resolved or stable issues: CAP (community acquired pneumonia) Agree, more likely is just viral LRTI  Hypocalcemia Supplemented  Myocardial injury Due to sepsis, hypotension, no angina or signs of ischemia.  UTI (urinary tract infection) Completed course of antibiotics.  Acute blood loss anemia Hgb range has been 8-12 over the last 6 months, recently more 8-10.  On admission, Hgb <7 g/dL. Transfused 3 units this hospitalization.   Stable since surgery.  No clinical bleeding - Trend Hgb  Acute on chronic respiratory failure with hypoxia (HCC) Not on O2 at baseline. Since May, has been on 2L O2 at [facility], unable to wean off there.   Here, she had new infiltrates, has had hypoxia to the mid-80s, respiratory rates in the 30s and requires 4L O2 to maintain O2 sats up from her recent baseline 3L.  Now resolved to recent baseline.  Hypomagnesemia Supplemented and resolved  Acquired hypercoagulable state ([place]) Due to Afib  Pulmonary hypertension (HCC) - Hold furosemide for now  Leukopenia Chronic and intermittent, stable, in setting of follicular lymphoma  Hypokalemia Supplemented and resolved  Sepsis ([place]) Presented with tachycardia, leukopenia, encephalopathy, worsening respiratory failure in the setting of worsening pulmonary infiltrates. Shock was not present.    Suspected to be urinary source, urine culture growing citrobacter susc to CTX.  RVP positive for rhinovirus.  Completed 5 days antibiotics. Resolved.  Restarting abx now due to transaminitis, until cholangitis can be ruled out  Stage 3a chronic kidney disease ([place]) AKI ruled out. Creatinine  stable           Subjective: Patient is somewhat subdued postop, but she shakes her head the pain, shortness of breath, and has no respiratory distress, vomiting, agitation.  Temperature is somewhat low.     Physical Exam: Vitals:   05/03/2022 1430 04/22/2022 1431 04/27/2022 1500 04/17/2022 1530  BP: (!) 100/52 (!) 102/58  105/74  Pulse: (!) 102  98 96  Resp: 16  17   Temp: (!) 95.9 F (35.5 C)   (!) 96.7 F (35.9 C)  TempSrc: Oral     SpO2: 96%   94%  Weight:      Height:       Elderly adult female, appears sluggish and sleepy postanesthesia, eyes mostly closed, opens them briefly, sluggishly follows commands, then falls back asleep Heart rate tachycardic, regular, I do not appreciate murmurs today, she has mild pitting edema in the ankles, JVP not visible Respiratory rate slow and shallow given anesthesia, do not appreciate rales or wheezes although the lung sounds are diminished, nasal cannula is in place Abdomen soft without grimace to palpation Attention diminished, sluggish, opens eyes and makes eye contact and closes eyes again, tries to follow commands but is very weak and global signs  Data Reviewed: Esophagoscopy operative report reviewed, discussed with Dr. Redmond Baseman. Comprehensive metabolic panel shows transaminitis, stable renal function and electrolytes.  Discussed with pharmacy and gastroenterology. Complete blood count unchanged from previous, hemoglobin stable  Family Communication: Daughter by phone    Disposition: Status is: Inpatient  The patient was admitted for hip fracture.  Subsequent hospital course has been complicated and prolonged by dysphagia.  This appears to be successfully treated by ENT today and the plan will be to work with speech therapy tomorrow, hopefully wean off of tube feeds and onto oral diet before discharge over the next few days.  She also has new transaminitis which we are working up  She is medically complex and her  overall prognosis is concerning.              Author: Edwin Dada, MD 05/07/2022 4:09 PM  For on call review [email or web address].

## 2022-04-25 NOTE — Brief Op Note (Signed)
05/17/2022  10:11 AM  PATIENT:  Carrie [name]  81 y.o. female  PRE-OPERATIVE DIAGNOSIS:  Esophagial Dysfucntion  POST-OPERATIVE DIAGNOSIS:  Upper esophageal esophageal narrowing  PROCEDURE:  Procedure(s): RIGID ESOPHAGOSCOPY WITH DILATION AND BOTOX INJECTION (N/A)  SURGEON:  Surgeon(s) and Role:    * Melida Quitter, MD - Primary  PHYSICIAN ASSISTANT:   ASSISTANTS: none   ANESTHESIA:   general  EBL:  Minimal   BLOOD ADMINISTERED:none  DRAINS: none   LOCAL MEDICATIONS USED:  NONE  SPECIMEN:  No Specimen  DISPOSITION OF SPECIMEN:  N/A  COUNTS:  YES  TOURNIQUET:  * No tourniquets in log *  DICTATION: .Note written in EPIC  PLAN OF CARE:  Return to inpatient setting  PATIENT DISPOSITION:  PACU - hemodynamically stable.   Delay start of Pharmacological VTE agent (>24hrs) due to surgical blood loss or risk of bleeding: no

## 2022-04-25 NOTE — Progress Notes (Signed)
   Called by Dr. Loleta Books to review an EKG after multiple telemetry alerts for recurrent nonsustained VT.  EKG performed at 2:34 PM was personally reviewed and I am told this is reflective of what is being seen on the monitor.  This demonstrates atrial fibrillation with aberrant conduction.  This is clear with concordant negative deflections in the precordial leads, no evidence of any compensatory pauses or other features that would suggest ventricular arrhythmia.  I am told she is also asymptomatic and hemodynamically stable.  In addition she is on amiodarone and a beta-blocker.  I suspect this could be related to recent anesthesia.  Would continue current therapies.  Pixie Casino, MD, [facility], [place] Director of the Advanced Lipid Disorders &  Cardiovascular Risk Reduction Clinic Diplomate of the American Board of Clinical Lipidology Attending Cardiologist  Direct Dial: [phone]  Fax: [phone]  Website:  [email or web address]

## 2022-04-25 NOTE — Progress Notes (Signed)
PHARMACY - TOTAL PARENTERAL NUTRITION CONSULT NOTE   Indication: High grade upper esophageal stenosis, GI state that NG tube and enteral feeding are contraindicated, has had prolonged decreased intake  Patient Measurements: Height: 5' 0.98" (154.9 cm) Weight: 63 kg (138 lb 14.2 oz) IBW/kg (Calculated) : 47.76 TPN AdjBW (KG): 51.6 Body mass index is 26.26 kg/m. Usual Weight: 63.6 kg (03/2021)  Assessment:  Pharmacy TPN consult for this 81 yo F with high grade upper esophageal stenosis.  GI state that NG tube and enteral feeding are contraindicated and patient has had prolonged decreased intake.  Per attending's note, long discussion with family about goals of care.  Patient living will states she would not want feeding tube, but family does interpret her wishes would be that if artificial nutrition via TPN were able to sustain her until a (hopefully reversible) cauyse of her dysphagia could be found, she would want that. - Start TPN - If no reversible cause of dysphagia can be found, would transition to comfort measures  Glucose / Insulin: no history of diabetes Hyperglycemic prior to initiation of TPN Initiated on sSSI q4h Electrolytes: WNL Renal: SCr up 1.15, BUN WNL Hepatic: AST/ALT elevated (prior to initiation of TPN), albumin low Intake / Output; MIVF: Poor appetite, D5NS at 100 ml/hr, Strict I&O ordered UOP 800 mL, Stool x 1 - po Intake 90 on 7/8 (CLD) GI Imaging:  7/6 SLP eval and MBS appears to show severe UES dysfunction and pharyngoesophageal dysphagia GI Surgeries / Procedures:  7/8 EGD shows very high level esophageal dysfunction, no intervention possible, ENT consultation recommended 7/9 Rigid esophagoscopy with dilation and botox inj  Central access: has implanted port TPN start date: 7/9  Nutritional Goals: Goal TPN rate is 70 mL/hr (provides 84 g of protein and 1613 kcals per day)  RD Assessment: Estimated Needs Total Energy Estimated Needs: 1500-1700 Total  Protein Estimated Needs: 75-90g Total Fluid Estimated Needs: >/=1.5L  Current Nutrition:  NPO TPN to start 7/9  Plan:  At 18:00:  Decrease D5NS to 60 ml/hr Begin TPN at reduced initial rate of 40 mL/hr due to risk of refeeding syndrome Electrolytes in TPN: Na 41mq/L, K 560m/L, Ca 63m47mL, Mg 63mE83m, and Phos 163mm84m. Cl:Ac 1:1 Add standard MVI, chromium, and trace elements to TPN Add thiamine 100 mg to TPN x 5 days per RD recommendation  Continue Sensitive q4h SSI and adjust as needed  Monitor TPN labs on Mon/Thurs & prn, BMET with/without phos & magnesium prn  Thank you for allowing pharmacy to be a part of this patient's care.  MelisRoyetta AsalrmD, BCPS Clinical Pharmacist  Please utilize Amion for appropriate phone number to reach the unit pharmacist ([facility]/2023 11:17 AM

## 2022-04-25 NOTE — Anesthesia Procedure Notes (Signed)
Procedure Name: Intubation Date/Time: 05/02/2022 9:21 AM  Performed by: Vonna Drafts, CRNAPre-anesthesia Checklist: Patient identified, Emergency Drugs available, Suction available and Patient being monitored Patient Re-evaluated:Patient Re-evaluated prior to induction Oxygen Delivery Method: Circle system utilized Preoxygenation: Pre-oxygenation with 100% oxygen Induction Type: IV induction Ventilation: Mask ventilation without difficulty Laryngoscope Size: Mac and 3 Grade View: Grade I Tube type: Oral Tube size: 7.0 mm Number of attempts: 1 Airway Equipment and Method: Stylet and Oral airway Placement Confirmation: ETT inserted through vocal cords under direct vision, positive ETCO2 and breath sounds checked- equal and bilateral Secured at: 21 cm Tube secured with: Tape Dental Injury: Teeth and Oropharynx as per pre-operative assessment

## 2022-04-25 NOTE — Anesthesia Preprocedure Evaluation (Addendum)
Anesthesia Evaluation  Patient identified by MRN, date of birth, ID band Patient confused    Reviewed: Allergy & Precautions, NPO status , Patient's Chart, lab work & pertinent test results  Airway Mallampati: II  TM Distance: >3 FB Neck ROM: Full    Dental no notable dental hx.    Pulmonary shortness of breath and Long-Term Oxygen Therapy,    Pulmonary exam normal        Cardiovascular hypertension, Pt. on home beta blockers +CHF  Normal cardiovascular exam+ dysrhythmias      Neuro/Psych  Headaches, PSYCHIATRIC DISORDERS Anxiety  Neuromuscular disease    GI/Hepatic negative GI ROS, Neg liver ROS,   Endo/Other  Hypothyroidism   Renal/GU Renal disease     Musculoskeletal  (+) Arthritis ,   Abdominal   Peds  Hematology  (+) Blood dyscrasia, anemia ,   Anesthesia Other Findings Esophagial Dysfucntion  Reproductive/Obstetrics                            Anesthesia Physical Anesthesia Plan  ASA: 4  Anesthesia Plan: General   Post-op Pain Management:    Induction: Intravenous  PONV Risk Score and Plan: 3 and Ondansetron and Treatment may vary due to age or medical condition  Airway Management Planned: Oral ETT  Additional Equipment:   Intra-op Plan:   Post-operative Plan: Extubation in OR  Informed Consent: I have reviewed the patients History and Physical, chart, labs and discussed the procedure including the risks, benefits and alternatives for the proposed anesthesia with the patient or authorized representative who has indicated his/her understanding and acceptance.   Patient has DNR.  Discussed DNR with power of attorney and Continue DNR.   Dental advisory given and Consent reviewed with POA  Plan Discussed with: CRNA  Anesthesia Plan Comments: (Family requests no chest compressions and no defibrillation )       Anesthesia Quick Evaluation

## 2022-04-25 NOTE — Transfer of Care (Signed)
Immediate Anesthesia Transfer of Care Note  Patient: Carrie [name]  Procedure(s) Performed: RIGID ESOPHAGOSCOPY WITH DILATION AND BOTOX INJECTION  Patient Location: PACU  Anesthesia Type:General  Level of Consciousness: drowsy  Airway & Oxygen Therapy: Patient Spontanous Breathing and Patient connected to face mask oxygen  Post-op Assessment: Report given to RN and Post -op Vital signs reviewed and stable  Post vital signs: Reviewed and stable  Last Vitals:  Vitals Value Taken Time  BP    Temp    Pulse    Resp 16 05/10/2022 1017  SpO2    Vitals shown include unvalidated device data.  Last Pain:  Vitals:   04/20/2022 0838  TempSrc: Oral  PainSc:       Patients Stated Pain Goal: 2 (50/38/88 2800)  Complications: No notable events documented.

## 2022-04-25 NOTE — Progress Notes (Signed)
Labs drawn and sent as requested by [facility] RN.  Dr. Roanna Banning made aware pt's last Metoprolol was last night. Pt unable to take oral meds currently, requires ice cream.  Pt on monitor, rate controlled A-Fib.  Family at bedside in pre-op.

## 2022-04-25 NOTE — Progress Notes (Signed)
Spoke with Otila Kluver at [facility] at [phone] to get an eta for patients transport to [facility], was informed that an emergency had come up and they had to rearrange some things, Otila Kluver said she was going to see what they could do.  Received call back from [facility] at [phone] saying that [name] would have the patient to [facility] short stay by 0745 and Sarah from [facility] short stay was made aware of this and said that time would be fine.

## 2022-04-25 NOTE — Assessment & Plan Note (Addendum)
This is another unexpected finding today. Has had AST above the ULN since May, but in the 50-60 range, then LFTs obtained today showed AST/ALT 837/328.  Unclear cause.  Doubt acetaminophen, as she has been in a monitored setting.  Cholangitis seems possible, although no abdominal pain.  General anesthesia seems also possible.  Amiodarone possible. - Hold hepatotoxins - I will elect to continue amiodarone for now, given her ectopy on monitoring - Obtain US abdomen - Resume antibiotics with CTX/flagyl until gallbladder disease/cholangitis can be ruled out - Consult GI, appreciate cares

## 2022-04-26 ENCOUNTER — Encounter (HOSPITAL_COMMUNITY): Payer: Self-pay | Admitting: Otolaryngology

## 2022-04-26 DIAGNOSIS — I482 Chronic atrial fibrillation, unspecified: Secondary | ICD-10-CM | POA: Diagnosis not present

## 2022-04-26 DIAGNOSIS — D62 Acute posthemorrhagic anemia: Secondary | ICD-10-CM | POA: Diagnosis not present

## 2022-04-26 DIAGNOSIS — E875 Hyperkalemia: Secondary | ICD-10-CM

## 2022-04-26 DIAGNOSIS — E440 Moderate protein-calorie malnutrition: Secondary | ICD-10-CM | POA: Insufficient documentation

## 2022-04-26 DIAGNOSIS — I272 Pulmonary hypertension, unspecified: Secondary | ICD-10-CM

## 2022-04-26 DIAGNOSIS — I5032 Chronic diastolic (congestive) heart failure: Secondary | ICD-10-CM

## 2022-04-26 DIAGNOSIS — C8238 Follicular lymphoma grade IIIa, lymph nodes of multiple sites: Secondary | ICD-10-CM

## 2022-04-26 DIAGNOSIS — R7401 Elevation of levels of liver transaminase levels: Secondary | ICD-10-CM

## 2022-04-26 DIAGNOSIS — G9341 Metabolic encephalopathy: Secondary | ICD-10-CM | POA: Diagnosis not present

## 2022-04-26 DIAGNOSIS — N179 Acute kidney failure, unspecified: Secondary | ICD-10-CM | POA: Diagnosis not present

## 2022-04-26 DIAGNOSIS — N189 Chronic kidney disease, unspecified: Secondary | ICD-10-CM

## 2022-04-26 DIAGNOSIS — E038 Other specified hypothyroidism: Secondary | ICD-10-CM

## 2022-04-26 DIAGNOSIS — S72002A Fracture of unspecified part of neck of left femur, initial encounter for closed fracture: Secondary | ICD-10-CM | POA: Diagnosis not present

## 2022-04-26 LAB — COMPREHENSIVE METABOLIC PANEL
ALT: 809 U/L — ABNORMAL HIGH (ref 0–44)
AST: 2012 U/L — ABNORMAL HIGH (ref 15–41)
Albumin: 2.3 g/dL — ABNORMAL LOW (ref 3.5–5.0)
Alkaline Phosphatase: 196 U/L — ABNORMAL HIGH (ref 38–126)
Anion gap: 14 (ref 5–15)
BUN: 26 mg/dL — ABNORMAL HIGH (ref 8–23)
CO2: 14 mmol/L — ABNORMAL LOW (ref 22–32)
Calcium: 8 mg/dL — ABNORMAL LOW (ref 8.9–10.3)
Chloride: 110 mmol/L (ref 98–111)
Creatinine, Ser: 1.48 mg/dL — ABNORMAL HIGH (ref 0.44–1.00)
GFR, Estimated: 36 mL/min — ABNORMAL LOW (ref >60)
Glucose, Bld: 119 mg/dL — ABNORMAL HIGH (ref 70–99)
Potassium: 6 mmol/L — ABNORMAL HIGH (ref 3.5–5.1)
Sodium: 138 mmol/L (ref 135–145)
Total Bilirubin: 2.2 mg/dL — ABNORMAL HIGH (ref 0.3–1.2)
Total Protein: 4.6 g/dL — ABNORMAL LOW (ref 6.5–8.1)

## 2022-04-26 LAB — BASIC METABOLIC PANEL
Anion gap: 12 (ref 5–15)
BUN: 28 mg/dL — ABNORMAL HIGH (ref 8–23)
CO2: 17 mmol/L — ABNORMAL LOW (ref 22–32)
Calcium: 8.1 mg/dL — ABNORMAL LOW (ref 8.9–10.3)
Chloride: 109 mmol/L (ref 98–111)
Creatinine, Ser: 1.76 mg/dL — ABNORMAL HIGH (ref 0.44–1.00)
GFR, Estimated: 29 mL/min — ABNORMAL LOW (ref >60)
Glucose, Bld: 243 mg/dL — ABNORMAL HIGH (ref 70–99)
Potassium: 4.7 mmol/L (ref 3.5–5.1)
Sodium: 138 mmol/L (ref 135–145)

## 2022-04-26 LAB — GLUCOSE, CAPILLARY
Glucose-Capillary: 119 mg/dL — ABNORMAL HIGH (ref 70–99)
Glucose-Capillary: 108 mg/dL — ABNORMAL HIGH (ref 70–99)
Glucose-Capillary: 120 mg/dL — ABNORMAL HIGH (ref 70–99)
Glucose-Capillary: 216 mg/dL — ABNORMAL HIGH (ref 70–99)
Glucose-Capillary: 231 mg/dL — ABNORMAL HIGH (ref 70–99)
Glucose-Capillary: 201 mg/dL — ABNORMAL HIGH (ref 70–99)

## 2022-04-26 LAB — MAGNESIUM
Magnesium: 2.2 mg/dL (ref 1.7–2.4)
Magnesium: 2.2 mg/dL (ref 1.7–2.4)

## 2022-04-26 LAB — CBC
HCT: 33.6 % — ABNORMAL LOW (ref 36.0–46.0)
Hemoglobin: 10.5 g/dL — ABNORMAL LOW (ref 12.0–15.0)
MCH: 29.7 pg (ref 26.0–34.0)
MCHC: 31.3 g/dL (ref 30.0–36.0)
MCV: 94.9 fL (ref 80.0–100.0)
Platelets: 172 10*3/uL (ref 150–400)
RBC: 3.54 MIL/uL — ABNORMAL LOW (ref 3.87–5.11)
RDW: 21.6 % — ABNORMAL HIGH (ref 11.5–15.5)
WBC: 4.1 10*3/uL (ref 4.0–10.5)
nRBC: 14.8 % — ABNORMAL HIGH (ref 0.0–0.2)

## 2022-04-26 LAB — CK: Total CK: 54 U/L (ref 38–234)

## 2022-04-26 LAB — PHOSPHORUS: Phosphorus: 4.1 mg/dL (ref 2.5–4.6)

## 2022-04-26 MED ORDER — LACTATED RINGERS IV BOLUS
500.0000 mL | Freq: Once | INTRAVENOUS | Status: AC
  Administered 2022-04-26: 500 mL via INTRAVENOUS

## 2022-04-26 MED ORDER — SODIUM CHLORIDE 0.9 % IV BOLUS
500.0000 mL | Freq: Once | INTRAVENOUS | Status: AC
Start: 2022-04-26 — End: 2022-04-26
  Administered 2022-04-26: 500 mL via INTRAVENOUS

## 2022-04-26 MED ORDER — INSULIN ASPART 100 UNIT/ML IJ SOLN
10.0000 [IU] | Freq: Once | INTRAMUSCULAR | Status: AC
  Administered 2022-04-26: 10 [IU] via INTRAVENOUS

## 2022-04-26 MED ORDER — OSMOLITE 1.2 CAL PO LIQD
1000.0000 mL | ORAL | Status: DC
  Administered 2022-04-26: 1000 mL

## 2022-04-26 MED ORDER — SODIUM ZIRCONIUM CYCLOSILICATE 10 G PO PACK
10.0000 g | PACK | Freq: Once | ORAL | Status: AC
Start: 2022-04-26 — End: 2022-04-26
  Administered 2022-04-26: 10 g
  Filled 2022-04-26: qty 1

## 2022-04-26 MED ORDER — FREE WATER
100.0000 mL | Freq: Four times a day (QID) | Status: DC
Start: 2022-04-26 — End: 2022-04-27
  Administered 2022-04-26 – 2022-04-27 (×4): 100 mL

## 2022-04-26 MED ORDER — ALBUMIN HUMAN 25 % IV SOLN: 12.5000 g | Freq: Once | INTRAVENOUS | Status: DC

## 2022-04-26 MED ORDER — NOREPINEPHRINE 4 MG/250ML-% IV SOLN
0.0000 ug/min | INTRAVENOUS | Status: DC
  Filled 2022-04-26: qty 250

## 2022-04-26 MED ORDER — SODIUM BICARBONATE 8.4 % IV SOLN
50.0000 meq | Freq: Once | INTRAVENOUS | Status: AC
  Administered 2022-04-26: 50 meq via INTRAVENOUS
  Filled 2022-04-26: qty 50

## 2022-04-26 MED ORDER — PROSOURCE TF PO LIQD
45.0000 mL | Freq: Two times a day (BID) | ORAL | Status: DC
  Administered 2022-04-26 – 2022-04-27 (×3): 45 mL
  Filled 2022-04-26 (×3): qty 45

## 2022-04-26 MED ORDER — METOCLOPRAMIDE HCL 5 MG PO TABS: 5.0000 mg | ORAL_TABLET | Freq: Three times a day (TID) | ORAL | Status: DC | PRN

## 2022-04-26 MED ORDER — DEXTROSE 50 % IV SOLN
50.0000 mL | Freq: Once | INTRAVENOUS | Status: AC
  Administered 2022-04-26: 50 mL via INTRAVENOUS
  Filled 2022-04-26: qty 50

## 2022-04-26 MED ORDER — CALCIUM GLUCONATE-NACL 1-0.675 GM/50ML-% IV SOLN
1.0000 g | Freq: Once | INTRAVENOUS | Status: AC
  Administered 2022-04-26: 1000 mg via INTRAVENOUS
  Filled 2022-04-26: qty 50

## 2022-04-26 MED ORDER — METOCLOPRAMIDE HCL 5 MG/ML IJ SOLN: 5.0000 mg | Freq: Three times a day (TID) | INTRAMUSCULAR | Status: DC | PRN

## 2022-04-26 MED ORDER — ALBUMIN HUMAN 25 % IV SOLN
12.5000 g | INTRAVENOUS | Status: AC
  Administered 2022-04-26 (×2): 12.5 g via INTRAVENOUS
  Filled 2022-04-26 (×2): qty 50

## 2022-04-26 NOTE — Progress Notes (Addendum)
Progress Note   Patient: Carrie [name] QQV:[id number] DOB: 08-28-1941 DOA: 04/28/2022     8 DOS: the patient was seen and examined on 04/26/2022   Brief hospital course: Carrie [name] is a 81 y.o. F with hx follicular lymphoma stage IIIa s/p chemotherapy with pancytopenia, dCHF, cAF on Eliquis, COVID last Feb with chronic cough, NOT on home O2, hypothyroidism, HTN, HLD, stage IIIb CKD who presented with confusion and unwitnessed fall with complaint of left hip pain.   In the ER, patient noted to have hypoxia, new hip fracture.  CTA chest showed no PE but confirmed mild multifocal infiltrates.       7/2: Admitted for hip fracture 7/3: Developed pre-op hypotension due to ABLA requiring 2u PRBCs, stabilised and went to OR in afternoon for ORIF 7/4: Hypotensive again overnight, got 3rd unit PRBCs, started on Levophed briefly, CCM consulted 7/5 transferred out of unit, MBS shows upper esophageal sphincter dysfunction 7/6 GI consulted 7/8 EGD shows very high level esophageal dysfunction, no intervention possible, CT neck unremarkable, ENT consulted 7/9 ENT performed rigid esophagoscopy and dilation and botox injection fo UES succesfully 7/10 around 4 AM in the morning patient noted to be hypotensive with blood pressures in the 70/60.  Despite bolus blood pressure still low giving albumin transfer to the ICU.  PCCM consulted.  Assessment and Plan: * Hip fracture ([place]) S/p ORIF 7/3 by Dr. Alvan Dame Post-op x-rays shows hardware in good position - Consult Orthopedics, appreciate recommendations - 50% partial weight bearing to left - DVT ppx: Resume Eliquis    Hypotension Acute on chronic.  Patient initially had become hypotensive preop surgery for the hip fracture on 7/3.  Subsequently became hypotensive again on 7/4 requiring transfer to ICU on Levophed.  Blood pressures stabilized transition off of Levophed and was transferred out of the unit on 7/5.  Blood pressure medications of metoprolol and  amiodarone and restarted.  However on 7/10 patient noted to be hypotensive at around 4 AM with blood pressure 70/60.  Rapid response called.  Bolused 500 mL of IV fluids by the overnight team with limited improvement.  Discussed her care with the patient's son over the phone who agrees and is okay with Levophed as patient had previously required this earlier on in the hospitalization stay. -Albumin 25 g ordered -Levophed as needed -Appreciate PCCM consultative services,  will continue to monitor to see if Levophed is required for which they will assume care of patient.  Hyponatremia Due to hypovolemia, stable, clinically insignificant - Hold furosemide  Hyperkalemia Patient had been hypokalemic which resolved with supplementation, but now is hyperkalemic with potassium of 6. - Order for [facility] were placed.  Acute kidney injury superimposed on chronic kidney disease ([place]) Acute kidney injury now present as creatinine went from 0.88->1.15-> 1.48.  Suspect secondary to hypotension as patient appears to be intravascularly dry due to third spacing. -Hold nephrotoxic agents   Dysphagia This was an incidental finding but has become a chief reason for hospitalization.    Based on report from family, seems to have been present for some time.  But was not an initial presenting complaint, nor volunteered by patient/family until noted by nursing to have recurrent choking, coughing here in the hospital.    Subsequent SLP eval and MBS appeared to show severe UES dysfunction and pharyngoesophageal dysphagia.  EGD 7/8 showed severe narrowing of the proximal esophagus, to the point the endoscopy could not pass, suspected to be cricopharyngeal bar versus extrinsic compression versus upper esophageal  stricture.  No dilation possible with EGD.  They recommended ENT consultation.  CT neck ruled out extrinsic compression.  ENT consulted and performed rigid esophagoscopy today, encountered same stenosis but  were able to successfully dilate and then inject Botox in the UES and place NG tube  - Tube feeds for now - Continue SLP tomorrow to try to attempt oral intake Post-dilation - Resume Eliquis   Acute metabolic encephalopathy At baseline, patient lives alone, still drives.  Since her last hospitalzation she has been in rehab, and has had intermittent disorientation, more cognitive processing deficits than normal.  Here, she was somnolent and sluggish on presentation due to sepsis.   During the first two days of hospital stay, before and immediately after surgery, she was oriented and mostly appropriate. Remains delirious - Delirium precautions  Transaminitis This is another unexpected finding today. Has had AST above the ULN since May, but in the 50-60 range, then LFTs obtained today showed AST/ALT 837/328.  Unclear cause.  Doubt acetaminophen, as she has been in a monitored setting.  Cholangitis seems possible, although no abdominal pain.  General anesthesia seems also possible.  Amiodarone possible. - Hold hepatotoxins - I will elect to continue amiodarone for now, given her ectopy on monitoring -  US abdomen- revealed Heterogeneous hepatic parenchyma, suspicious for hepatitis given elevated LFTs. Gallbladder sludge with asymmetric wall thickening and pericholecystic fluid. No sonographic Murphy sign. Appearance may be related to underlying liver disease given concern for possible hepatitis and asymmetric gallbladder wall thickening with pericholecystic fluid present. - Continue antibiotics with CTX/flagyl until gallbladder disease/cholangitis can be ruled out - Consult GI, appreciate cares  CAP (community acquired pneumonia) Agree, more likely is just viral LRTI  Hypocalcemia Supplemented  Myocardial injury Due to sepsis, hypotension, no angina or signs of ischemia.  UTI (urinary tract infection) Completed course of antibiotics.  Acute blood loss anemia Hgb range has been 8-12 over  the last 6 months, recently more 8-10.  On admission, Hgb <7 g/dL. Transfused 3 units this hospitalization.   Stable since surgery.  No clinical bleeding - Hemoglobin appears to be trending up 9.3->10.5  Acute on chronic respiratory failure with hypoxia (HCC) Not on O2 at baseline. Since May, has been on 2L O2 at [facility], unable to wean off there.   Here, she had new infiltrates, has had hypoxia to the mid-80s, respiratory rates in the 30s and requires 4L O2 to maintain O2 sats up from her recent baseline 3L.  Now resolved to recent baseline.  Hypomagnesemia Supplemented and resolved  Atrial fibrillation, chronic (HCC) HR low 100s.  Has frequent aberrancy post-anesthesia today, discussed with Cardiology, not VTach. - Held amiodarone due to rising LFTs and Metoprolol had to be held on 7/10 due to hypotension - Eliquis was held due to elevated transaminases 7/10 - Cardiology consulted to help assist with rate control  Chronic diastolic CHF (congestive heart failure) (HCC) Appears euvolemic to dry - Hold Lasix    Acquired hypercoagulable state ([place]) Due to Afib    Pulmonary hypertension (HCC) - Hold furosemide for now  Leukopenia Chronic and intermittent, stable, in setting of follicular lymphoma  Essential hypertension BP low normal - Hold furosemide - Continue amiodarone, metoprolol   Sepsis ([place]) Presented with tachycardia, leukopenia, encephalopathy, worsening respiratory failure in the setting of worsening pulmonary infiltrates. Shock was not present.    Suspected to be urinary source, urine culture growing citrobacter susc to CTX.  RVP positive for rhinovirus.  Completed 5 days antibiotics. Resolved.  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  Physical Exam: Vitals:   04/26/22 0447 04/26/22 0500 04/26/22 0701 04/26/22 0729  BP: (!) 82/61  (!) 70/60 (!) 89/59  Pulse: 74  79 66  Resp:  (!) 31    Temp: 98.6 F (37 C)     TempSrc: Oral     SpO2: 97%  98% 98%  Weight:      Height:       Exam  Constitutional: Elderly female who appears to be in no acute distress resting Eyes: PERRL, lids and conjunctivae normal ENMT: Mucous membranes are dry.  Core track in place Neck: normal, supple, no JVD. Respiratory: clear to auscultation bilaterally, no wheezing, no crackles. Normal respiratory effort. No accessory muscle use.  Cardiovascular: Irregular irregular with least 2+ pitting edema into the thighs. Abdomen: no tenderness. Bowel sounds positive.  Musculoskeletal: no clubbing / cyanosis. No joint deformity upper and lower extremities.  Neurologic: CN 2-12 grossly intact. Sensation intact, DTR normal. Strength 5/5 in all 4.  Psychiatric: Patient is lethargic, but will awaken and is alert and oriented to at least person  Data Reviewed:  Reviewed labs, imaging, and pertinent records as noted above  Family Communication: Discussed plan of care with the patient's son over the phone who agreed with pressors as he thinks she just needs little extra support as she previously had been living alone and able to drive.  Disposition: Status is: Inpatient Remains inpatient appropriate because: Due to the recurrence of hypotension and need of close monitoring.  Planned Discharge Destination: Barriers to discharge currently include patient's mental status and unstable blood pressures.    Time spent: 50 minutes  Author: Norval Morton, MD 04/26/2022 8:06 AM  For on call review [email or web address].

## 2022-04-26 NOTE — Significant Event (Signed)
Rapid Response Event Note   Reason for Call :  Hypotension  Initial Focused Assessment:  On arrival patient in bed with eyes closed. Per bedside RN BP was running low 80s since 5AM. BP 70/60 on assessment. Patient responds to commands and tracks with eyes. Voice is hoarse and hard to hear but answered no when asked if she is hurting.    SpO2 98% 2 L [place]. Lungs diminished bilaterally.  HR A fib on monitor with rate ranging from 80-105. 2-3+ pitting edema all extremities.   500 cc bolus ordered by night shift provider.   Smith MD paged and orders received to transfer patient to ICU for levophed and albumin.    Interventions:  Transfer to ICU  Event Summary:   MD Notified: [place] MD Call Time: 0700 Arrival Time: 0702 End Time: 0800  Josph Macho, RN

## 2022-04-26 NOTE — Progress Notes (Signed)
Subjective: In ICU off ventilator.  Having evidence of organ failure.  Feeding tube in place and being used.  Objective: Vital signs in last 24 hours: Temp:  [95.9 F (35.5 C)-98.6 F (37 C)] 98.6 F (37 C) (07/10 0447) Pulse Rate:  [66-102] 101 (07/10 1115) Resp:  [14-32] 20 (07/10 1115) BP: (70-138)/(52-97) 97/77 (07/10 1115) SpO2:  [90 %-99 %] 98 % (07/10 1115) Wt Readings from Last 1 Encounters:  05/11/2022 63 kg    Intake/Output from previous day: 07/09 0701 - 07/10 0700 In: 874.5 [I.V.:784.5; NG/GT:90] Out: 0  Intake/Output this shift: Total I/O In: 1751.1 [I.V.:570.8; IV Piggyback:1180.3] Out: -   General appearance: asleep, no distress  Recent Labs    05/16/2022 0414 04/26/22 0347  WBC 1.7* 4.1  HGB 9.3* 10.5*  HCT 29.0* 33.6*  PLT 199 172    Recent Labs    04/20/2022 0631 04/26/22 0347  NA 137 138  K 4.6 6.0*  CL 109 110  CO2 19* 14*  GLUCOSE 186* 119*  BUN 18 26*  CREATININE 1.15* 1.48*  CALCIUM 7.8* 8.0*    Medications: I have reviewed the patient's current medications.  Assessment/Plan: Dysphagia s/p esophageal dilation and Botox  Feeding tube in place and being used.  Have to see improvement in delirium and overall health status to see if swallow improved.   LOS: 8 days   Melida Quitter 04/26/2022, 12:32 PM

## 2022-04-26 NOTE — Progress Notes (Signed)
   EKG reviewed- there is afib, but regularized. LBBB is now present more frequently, but was intermittently prior to this. Not clear if this represents ischemia. Prognosis, however, is quite guarded at this point and she is not a candidate for ischemia evaluation.  Pixie Casino, MD, [facility], [place] Director of the Advanced Lipid Disorders &  Cardiovascular Risk Reduction Clinic Diplomate of the American Board of Clinical Lipidology Attending Cardiologist  Direct Dial: [phone]  Fax: [phone]  Website:  [email or web address]

## 2022-04-26 NOTE — Progress Notes (Signed)
0500 -  pt BP 82/61 MAP 69, HR 74 with RR 31, pt IV is saline locked, has tube feeds at 59m/hr...pt has had 2 urine occurrences  On call doc, MD Opyd  and rapid response RN notified. Manual pressure and bladder scan  requested.   bladder scanned 42cc, manual pressure 89/62  0530 -  she has non-pitting edema in extremities  0531 - Md response: She had normal echo in March but if MAP is over 65, we can just monitor   0700 currently at 70/60 MAP 66, HR 93..pt responds to voice as before  MD response: I don't think that's an accurate blood pressure reading. There's no pulse pressure, numbers are too close together. We can give a small fluid bolus though   Rapid Response RN (dayshift and night shift RNs) at bedside   Pt remained responsive to voice, able to follow commands (sticking out tongue, wiggling toes, Alert and oriented to name and birth per Rapid response Rn)   500cc bolus started

## 2022-04-26 NOTE — Progress Notes (Signed)
NAME:  Carrie [name], MRN:  [id number], DOB:  1941/01/18, LOS: 74 ADMISSION DATE:  05/10/2022, CONSULTATION DATE:  04/20/22 REFERRING MD:  Loleta Books, CHIEF COMPLAINT:  hypotension   History of Present Illness:  59DU with follicular lymphoma stage IIIa s/p chemotherapy with pancytopenia, dCHF, cAF on Eliquis, COVID last Feb with chronic cough, hypothyroidism, HTN, HLD, stage IIIb CKD who presented with confusion and fall at home with left hip pain found to have CAP and started on CTX/azithro, comminuted L hip fracture s/p ORIF 7/4 course c/b pre-op hypotension/ABLA requiring 2U pRBC and TXA, post-op hypotension given 1U pRBC and 500cc bolus. She was started on levo morning of 7/4 and PCCM consulted.  7/10 hypotension again with SBP as low as 70. Transferred back to ICU, PCCM asked to see in consultation again  Pertinent  Medical History  Follicular lymphoma stage IIIa dCHF Chronic AF Recent covid-19 infection CKD  Significant Hospital Events: Including procedures, antibiotic start and stop dates in addition to other pertinent events   7/2: Admitted for hip fracture 7/3: Developed pre-op hypotension due to ABLA requiring 2u PRBCs, stabilised and went to OR in afternoon for ORIF 7/4: Hypotensive again overnight, got 3rd unit PRBCs, started on Levophed briefly, CCM consulted 7/5 transferred out of unit, MBS shows upper esophageal sphincter dysfunction 7/6 GI consulted 7/8 EGD shows very high level esophageal dysfunction, no intervention possible, CT neck unremarkable, ENT consulted 7/9 ENT performed rigid esophagoscopy and dilation and botox injection fo UES succesfully 7/10 hypotension again with SBP as low as 70. Transferred back to ICU, PCCM asked to see in consultation again  Interim History / Subjective:  BP 80's/50's with MAP ranging from 60 to 70. She is somnolent but arouses to voice. Mucous membranes very dry. Just completed a 500cc bolus.  Objective   Blood pressure (!) 89/59,  pulse 66, temperature 98.6 F (37 C), temperature source Oral, resp. rate (!) 31, height 5' 0.98" (1.549 m), weight 63 kg, SpO2 98 %.        Intake/Output Summary (Last 24 hours) at 04/26/2022 0817 Last data filed at 04/26/2022 0000 Gross per 24 hour  Intake 874.5 ml  Output 0 ml  Net 874.5 ml    Filed Weights   05/03/2022 0913 04/17/2022 1410 05/02/2022 0850  Weight: 59 kg 63 kg 63 kg    Examination: General: Adult female, resting in bed, in NAD. Neuro: Somnolent but arouses to voice, follows basic commands. HEENT: [place]/AT. Sclerae anicteric. EOMI. Cardiovascular: IRIR, no M/R/G.  Lungs: Respirations even and unlabored.  CTA bilaterally, No W/R/R. Abdomen: BS x 4, soft, NT/ND.  Musculoskeletal: No gross deformities, 2+ edema BLE's. Skin: Intact, warm, no rashes.  Assessment & Plan:   Hypotension - initially ABLA; this occurrence presumed hypovolemia and 3rd spacing. She is s/p 500 cc bolus with minimal improvement. - Additional 500 cc bolus along with Albumin x 2. - Low dose levophed if no response to above. - D/c metoprolol.  Hx A.fib, dCHF. - Continue Eliquis. - Would recommend d/c amiodarone given abrupt climb in LFT's, discussed with Dr. Tamala Julian of [facility] who will also consult cards. - Hold Lasix.  Hyperkalemia. - 10g Lokelma now.  - 1g Ca gluconate, 1 amp D50, 10u insulin, 1 amp HCO3. Transaminitis - worsened. Unclear etiology though abd [place] concerning for hepatitis. TRH initially considered cholangitis; therefore, covered with CTX/flagyl. Given her exam and labs, doubt she has cholangitis and favor hepatitis, likely med induced. - GI consulted by [facility] yesterday evening 7/9, presume  will see today 7/10. - Would recommend d/c amiodarone given abrupt climb in LFT's, discussed with Dr. Tamala Julian of [facility] who will also consult cards. - Likely d/c CTX/flagyl, will discuss with Dr. Ander Slade.  Dysphagia - noted to have severe narrowing of prox esophagus not ammenable to dilatation via EGD.  Ultimately required ENT consultation with rigid esophagoscopy with successful dilatation and botox injection. - Continue tube feeds. - SLP eval.  Acute blood loss anemia - Transfuse for Hgb < 7.  Comminuted L hip fracture s/p ORIF - Supportive care. - PWB with 50% to LLE.  CAP, possible viral LRTI from rhino/enterovirus - s/p course CTX. UTI - s/p course CTX. - Supportive care.  Hx hypothyroidism. - Continue synthroid.  Hx lymphoma - grade 3a follicular of lymph nodes of multiple regions. In remission on Rituximab with last dose Jan 2023. - Supportive care.   Best Practice (right click and "Reselect all SmartList Selections" daily)   Diet/type: Regular consistency (see orders) DVT prophylaxis: SCD GI prophylaxis: N/A Lines: yes and it is still needed - port Foley:  N/A Code Status:  DNR - pressors OK per TRH discussion with family Last date of multidisciplinary goals of care discussion [pt updated at bedside]  Critical care time: 35 minutes    Montey Hora, [place] For pager details, please see AMION or use Epic chat  After 1900, please call [place] for cross coverage needs 04/26/2022, 8:45 AM

## 2022-04-26 NOTE — Progress Notes (Signed)
eLink Physician-Brief Progress Note Patient Name: Carrie [name] DOB: [birth year] MRN: [id number]   Date of Service  04/26/2022  HPI/Events of Note  Patient needs review of recently completed 12 lead EKG.  eICU Interventions  Patient with  atrial fibrillation with RVR, left bundle branch block , and non-specific ST, T changes, no evidence for STEMI. EKG is similar to baseline EKG.        Frederik Pear 04/26/2022, 9:47 PM

## 2022-04-26 NOTE — Progress Notes (Signed)
Nutrition Follow-up  DOCUMENTATION CODES:   Non-severe (moderate) malnutrition in context of chronic illness  INTERVENTION:  - will adjust TF regimen: Osmolite 1.2 @ 30 ml/hr and advance by 10 ml every 24 hours to reach goal rate of 50 ml/hr with 45 ml Prosource TF BID and 100 ml free water QID.  - at goal rate, this regimen will provide 1520 kcal, 89 grams protein, and 1384 ml free water.  - monitor magnesium, potassium, and phosphorus BID for at least 3 days, MD to replete as needed, as pt is at risk for refeeding syndrome given.   NUTRITION DIAGNOSIS:   Moderate Malnutrition related to chronic illness as evidenced by mild fat depletion, mild muscle depletion. -revised, ongoing  GOAL:   Patient will meet greater than or equal to 90% of their needs -to be met with TF regimen  MONITOR:   Diet advancement, TF tolerance, Labs, Weight trends  REASON FOR ASSESSMENT:   Consult Enteral/tube feeding initiation and management  ASSESSMENT:   Pt admitted from [facility] after a fall leading to L hip fracture. PMH significant for PAF s/p DCCV 5/3 with ERAF, CHF, follicular lymphoma stage IIIa s/p chemotherapy with pancytopenia, hypothyroidism, HTN, HLD, CKD stage IIIb and chronic cough since recovering from COVID-19 early 2023 on chronic O2.  Patient assessed remotely by a RD on 7/8.  Yesterday, 7/9 patient underwent rigid esophagoscopy with dilation and botox injection. Small bore NGT placed during that time (abdominal x-ray ordered yesterday but not yet completed).   Order in place for Osmolite 1.5 @ 30 ml/hr which provides 1080 kcal, 45 grams protein, and 549 ml free water. Confirmed with RN; no issues since patient transferred from 4E to 2W d/t Rapid Response.   Patient's son and daughter-in-law at bedside and provide all information while patient sleeping. Patient is noted to be a/o to self only.   Patient has been at [facility] since previous hospitalization. She has been  minimally ambulatory while at the facility and BLE edema has been worsening. Patient has had decreased intake and some pocketing for the past 1-2 months.   Family transparent of plan to monitor progression with dilation yesterday, tube feeding, and ability to advance diet and if patient is not progressing will re-assess [place]. They do not desire PEG/G-tube.     Labs reviewed; CBGs: 119, 108, 120, 216 mg/dl, K: 6 mmol/l, BUN: 26 mmol/l, creatinine: 1.48 mg/dl, Ca: 8 mg/dl, Alk Phos and LFTs elevated trending up today, GFR: 36 ml/min.  Medications reviewed; 12.5 g albumin x1 dose 7/10, 100 mg colace BID, sliding scale novolog, 88 mcg oral synthroid/day, 10 g lokelma per tube x1 dose 7/10 AM.    NUTRITION - FOCUSED PHYSICAL EXAM:  Flowsheet Row Most Recent Value  Orbital Region No depletion  Upper Arm Region Mild depletion  Thoracic and Lumbar Region Unable to assess  Buccal Region Mild depletion  Temple Region Mild depletion  Clavicle Bone Region Mild depletion  Clavicle and Acromion Bone Region Mild depletion  Scapular Bone Region Mild depletion  Dorsal Hand No depletion  Patellar Region No depletion  Anterior Thigh Region No depletion  Posterior Calf Region No depletion  Edema (RD Assessment) Mild  [all extremities]  Hair Reviewed  Eyes Unable to assess  Mouth Unable to assess  Skin Reviewed  Nails Reviewed       Diet Order:   Diet Order             Diet clear liquid Room service appropriate? Yes; Fluid consistency:  Nectar Thick  Diet effective now                   EDUCATION NEEDS:   No education needs have been identified at this time  Skin:  Skin Assessment: Reviewed RN Assessment  Last BM:  7/9 (type 6 x1, small amount)  Height:   Ht Readings from Last 1 Encounters:  05/10/2022 5' 0.98" (1.549 m)    Weight:   Wt Readings from Last 1 Encounters:  05/10/2022 63 kg     BMI:  Body mass index is 26.26 kg/m.  Estimated Nutritional Needs:  Kcal:   1500-1700 Protein:  75-90g Fluid:  >/=1.5L     Jarome Matin, MS, RD, LDN, CNSC Registered Dietitian II Inpatient Clinical Nutrition RD pager # and on-call/weekend pager # available in [place]

## 2022-04-26 NOTE — Progress Notes (Signed)
PT Cancellation Note  Patient Details Name: Carrie [name] MRN: [id number] DOB: 01-May-1941   Cancelled Treatment:    Reason Eval/Treat Not Completed: Medical issues which prohibited therapy. Will check back another day.    [facility]  Office: [phone] Pager: [phone]

## 2022-04-26 NOTE — Assessment & Plan Note (Signed)
Acute on chronic.  Patient initially had become hypotensive preop surgery for the hip fracture on 7/3.  Subsequently became hypotensive again on 7/4 requiring transfer to ICU on Levophed.  Blood pressures stabilized transition off of Levophed and was transferred out of the unit on 7/5.  Blood pressure medications of metoprolol and amiodarone and restarted.  However on 7/10 patient noted to be hypotensive at around 4 AM with blood pressure 70/60.  Rapid response called.  Bolused 500 mL of IV fluids by the overnight team with limited improvement.  Discussed her care with the patient's son over the phone who agrees and is okay with Levophed as patient had previously required this earlier on in the hospitalization stay. -Albumin 25 g ordered -Levophed as needed -Appreciate PCCM consultative services,  will continue to monitor to see if Levophed is required for which they will assume care of patient.

## 2022-04-26 NOTE — Consult Note (Signed)
CONSULTATION NOTE   Patient Name: Carrie [name] Date of Encounter: 04/26/2022 Cardiologist: [name], MD Electrophysiologist: None Advanced Heart Failure: None   Chief Complaint   none  Patient Profile   81 yo female with afib and RVR, with aberrancy, asked to help with rate control  HPI   Carrie [name] is a 81 y.o. female who is being seen today for the evaluation of afib at the request of Dr. Tamala Julian. This is a 81 year old female followed in A-fib clinic and by Dr. Johney Frame.  She has been maintained on amiodarone and Eliquis.  She is now admitted with a fall and hip fracture.  She states she has had poorly controlled atrial fibrillation and cardiology is asked to recommend management.  Amiodarone has been discontinued because of increasing liver enzymes.  Her most recent AST and ALT were 2012 and 809 respectively.  GI had been consulted 2 days ago however her liver enzymes were normal at that time.  Prior to the hospitalization she had not been eating well and has continued to decline since this past May according to her family.  PMHx   Past Medical History:  Diagnosis Date   A-fib ([place])    Anemia    pt denies    Atrial fibrillation with rapid ventricular response ([place]) 06/25/2015   Carpal tunnel syndrome, bilateral    pt denies    Cervical spondylosis without myelopathy 10/25/2013   Dental bridge present    DJD (degenerative joint disease)    Dyspnea    sometimes hx of cancer non hodgkins lymphoma    Dysrhythmia    WENT INTO A FIB IN 2017    Elevated troponin 06/22/7095   Follicular lymphoma grade 3a ([place]) 02/03/2015   hx of nonhodgkins lymphoma x 3    Headache    sometimes a headache    History of echocardiogram    Echo 12/16: EF 60-65%, no RWMA, severe LAE   History of nuclear stress test    Myoview 1/17: EF 55%, Normal study. No ischemia or scar.   Hypertension    Hypothyroid    Nausea without vomiting 06/20/2015   Stage III chronic kidney disease ([place])  07/01/2015   Thrush of mouth and esophagus ([place]) 06/25/2015    Past Surgical History:  Procedure Laterality Date   ABDOMINAL HYSTERECTOMY     APPENDECTOMY     BACK SURGERY     X5-lumbar-fusion   CARDIOVERSION N/A 02/17/2022   Procedure: CARDIOVERSION;  Surgeon: Berniece Salines, DO;  Location: [facility];  Service: Cardiovascular;  Laterality: N/A;   COLONOSCOPY     DILATION AND CURETTAGE OF UTERUS     ESOPHAGOSCOPY W/ BOTOX INJECTION N/A 05/17/2022   Procedure: RIGID ESOPHAGOSCOPY WITH DILATION AND BOTOX INJECTION;  Surgeon: Melida Quitter, MD;  Location: [facility];  Service: ENT;  Laterality: N/A;   INTRAMEDULLARY (IM) NAIL INTERTROCHANTERIC Left 05/17/2022   Procedure: INTRAMEDULLARY (IM) NAIL INTERTROCHANTRIC;  Surgeon: Paralee Cancel, MD;  Location: [facility];  Service: Orthopedics;  Laterality: Left;  Biomet short nail, [place]   LYMPH NODE BIOPSY Right 01/21/2015   Procedure: RIGHT GROIN LYMPH NODE BIOPSY;  Surgeon: Erroll Luna, MD;  Location: [facility];  Service: General;  Laterality: Right;   MASS EXCISION Left 08/29/2018   Procedure: EXCISION LEFT BACK  MASS;  Surgeon: Erroll Luna, MD;  Location: [facility];  Service: General;  Laterality: Left;   Ovarian cyst resection     patelar tendon transplants  Left/right   PORTACATH PLACEMENT Right 02/13/2015   Procedure: INSERTION PORT-A-CATH WITH ULTRASOUND;  Surgeon: Erroll Luna, MD;  Location: [facility];  Service: General;  Laterality: Right;   PORTACATH PLACEMENT N/A 08/29/2018   Procedure: INSERTION PORT-A-CATH WITH ULTRA SOUND ERAS PATHWAY;  Surgeon: Erroll Luna, MD;  Location: [facility];  Service: General;  Laterality: N/A;   REVERSE SHOULDER ARTHROPLASTY Left 03/02/2021   Procedure: REVERSE SHOULDER ARTHROPLASTY;  Surgeon: Netta Cedars, MD;  Location[facility];  Service: Orthopedics;  Laterality: Left;   RIGHT/LEFT HEART CATH AND CORONARY ANGIOGRAPHY N/A 01/15/2022   Procedure: RIGHT/LEFT HEART CATH AND CORONARY ANGIOGRAPHY;   Surgeon: [name], MD;  Location: [facility];  Service: Cardiovascular;  Laterality: N/A;   TOTAL KNEE ARTHROPLASTY  2011   Right   TUBAL LIGATION      FAMHx   Family History  Problem Relation Age of Onset   Cancer Mother        Breast, lung NHL   Cancer Sister        Multiple myeloma    SOCHx    reports that she has never smoked. She has never used smokeless tobacco. She reports that she does not currently use alcohol. She reports that she does not use drugs.  Outpatient Medications   Current Facility-Administered Medications on File Prior to Encounter  Medication Dose Route Frequency Provider Last Rate Last Admin   sodium chloride 0.9 % injection 10 mL  10 mL Intravenous PRN Alvy Bimler, Ni, MD   10 mL at 04/05/17 1315   Current Outpatient Medications on File Prior to Encounter  Medication Sig Dispense Refill   acetaminophen (TYLENOL) 500 MG tablet Take 500 mg by mouth every 6 (six) hours as needed for mild pain or headache.     amiodarone (PACERONE) 200 MG tablet Take 1 tab (200 mg total) twice daily for 1 week and then reduce to 1 tablet (200 mg total) once daily. (Patient taking differently: Take 200 mg by mouth daily.) 45 tablet 1   apixaban (ELIQUIS) 5 MG TABS tablet Take 5 mg by mouth 2 (two) times daily.      Artificial Saliva (BIOTENE DRY MOUTH) LOZG Use as directed 1 lozenge in the mouth or throat every 4 (four) hours as needed (for dry mouth).     furosemide (LASIX) 40 MG tablet Take 1 tablet (40 mg total) by mouth daily. 30 tablet 1   ipratropium-albuterol (DUONEB) 0.5-2.5 (3) MG/3ML SOLN Take 3 mLs by nebulization every 6 (six) hours as needed (for wheezing or shortness of breath).     levothyroxine (SYNTHROID) 88 MCG tablet Take 1 tablet (88 mcg total) by mouth daily before breakfast. 30 tablet 0   Lidocaine-Prilocaine, Bulk, 2-2 % CREA Apply 1 application  topically daily as needed (:"for skin condition").     metoprolol tartrate (LOPRESSOR) 50 MG  tablet Take 1 tablet (50 mg total) by mouth 2 (two) times daily. 60 tablet 0   OXYGEN Inhale 2 L/min into the lungs as needed (for shortness of breath).     polyvinyl alcohol (LIQUIFILM TEARS) 1.4 % ophthalmic solution Place 1 drop into both eyes every 8 (eight) hours as needed for dry eyes.     senna (SENOKOT) 8.6 MG TABS tablet Take 2 tablets by mouth daily as needed for mild constipation.     traZODone (DESYREL) 50 MG tablet Take 1 tablet (50 mg total) by mouth at bedtime. (Patient taking differently: Take 50 mg by mouth every evening.) 30  tablet 0   lidocaine-prilocaine (EMLA) cream Apply 1 application topically daily as needed. (Patient not taking: Reported on 04/23/2022) 30 g 3    Inpatient Medications    Scheduled Meds:  apixaban  2.5 mg Oral BID   Chlorhexidine Gluconate Cloth  6 each Topical Daily   dextrose  50 mL Intravenous Once   docusate  100 mg Oral BID   feeding supplement (OSMOLITE 1.5 CAL)  1,000 mL Per Tube Q24H   insulin aspart  0-9 Units Subcutaneous Q4H   insulin aspart  10 Units Intravenous Once   levothyroxine  88 mcg Oral Q0600   nystatin   Topical BID   mouth rinse  15 mL Mouth Rinse 4 times per day   sodium bicarbonate  50 mEq Intravenous Once   sodium zirconium cyclosilicate  10 g Per Tube Once    Continuous Infusions:  sodium chloride Stopped (05/16/2022 2321)   sodium chloride 10 mL/hr at 04/22/22 0600   albumin human 12.5 g (04/26/22 0832)   calcium gluconate     cefTRIAXone (ROCEPHIN)  IV 2 g (04/27/2022 1645)   methocarbamol (ROBAXIN) IV     metronidazole 500 mg (04/17/2022 1855)   norepinephrine (LEVOPHED) Adult infusion      PRN Meds: sodium chloride, sodium chloride, bisacodyl, food thickener, guaiFENesin-dextromethorphan, ipratropium-albuterol, lip balm, menthol-cetylpyridinium **OR** phenol, methocarbamol **OR** methocarbamol (ROBAXIN) IV, metoCLOPramide **OR** metoCLOPramide (REGLAN) injection, morphine injection, ondansetron **OR** ondansetron  (ZOFRAN) IV, mouth rinse, mouth rinse, polyethylene glycol, sodium chloride flush   ALLERGIES   No Known Allergies  ROS   Pertinent items noted in HPI and remainder of comprehensive ROS otherwise negative.  Vitals   Vitals:   04/26/22 0447 04/26/22 0500 04/26/22 0701 04/26/22 0729  BP: (!) 82/61  (!) 70/60 (!) 89/59  Pulse: 74  79 66  Resp:  (!) 31    Temp: 98.6 F (37 C)     TempSrc: Oral     SpO2: 97%  98% 98%  Weight:      Height:        Intake/Output Summary (Last 24 hours) at 04/26/2022 7473 Last data filed at 04/26/2022 0000 Gross per 24 hour  Intake 90 ml  Output 0 ml  Net 90 ml   Filed Weights   05/10/2022 0913 04/17/2022 1410 05/06/2022 0850  Weight: 59 kg 63 kg 63 kg    Physical Exam   General appearance: Somnolent, does not awaken to stimuli Neck: no carotid bruit, no JVD, and thyroid not enlarged, symmetric, no tenderness/mass/nodules Lungs: clear to auscultation bilaterally Heart: regular rate and rhythm Abdomen: soft, non-tender; bowel sounds normal; no masses,  no organomegaly Extremities: extremities normal, atraumatic, no cyanosis or edema Pulses: 2+ and symmetric Skin: Mildly jaundiced Neurologic: Mental status: Somnolent, not interactive Psych: Not tested  Labs   Results for orders placed or performed during the hospital encounter of 04/18/22 (from the past 48 hour(s))  Glucose, capillary     Status: None   Collection Time: 04/24/2022 10:03 PM  Result Value Ref Range   Glucose-Capillary 89 70 - 99 mg/dL    Comment: Glucose reference range applies only to samples taken after fasting for at least 8 hours.  Glucose, capillary     Status: Abnormal   Collection Time: 05/05/2022 12:29 AM  Result Value Ref Range   Glucose-Capillary 115 (H) 70 - 99 mg/dL    Comment: Glucose reference range applies only to samples taken after fasting for at least 8 hours.  Glucose, capillary  Status: Abnormal   Collection Time:   4:11 AM  Result Value Ref  Range   Glucose-Capillary 183 (H) 70 - 99 mg/dL    Comment: Glucose reference range applies only to samples taken after fasting for at least 8 hours.  Comprehensive metabolic panel     Status: Abnormal   Collection Time: 05/15/2022  6:31 AM  Result Value Ref Range   Sodium 137 135 - 145 mmol/L   Potassium 4.6 3.5 - 5.1 mmol/L   Chloride 109 98 - 111 mmol/L   CO2 19 (L) 22 - 32 mmol/L   Glucose, Bld 186 (H) 70 - 99 mg/dL    Comment: Glucose reference range applies only to samples taken after fasting for at least 8 hours.   BUN 18 8 - 23 mg/dL   Creatinine, Ser 1.15 (H) 0.44 - 1.00 mg/dL   Calcium 7.8 (L) 8.9 - 10.3 mg/dL   Total Protein 4.1 (L) 6.5 - 8.1 g/dL   Albumin 1.9 (L) 3.5 - 5.0 g/dL   AST 837 (H) 15 - 41 U/L   ALT 328 (H) 0 - 44 U/L   Alkaline Phosphatase 180 (H) 38 - 126 U/L   Total Bilirubin 1.4 (H) 0.3 - 1.2 mg/dL   GFR, Estimated 48 (L) >60 mL/min    Comment: (NOTE) Calculated using the CKD-EPI Creatinine Equation (2021)    Anion gap 9 5 - 15    Comment: Performed at [facility], [place] [address], [place], [place] [address]  Magnesium     Status: None   Collection Time: 05/10/2022  6:31 AM  Result Value Ref Range   Magnesium 2.2 1.7 - 2.4 mg/dL    Comment: Performed at [facility] [address], [place], [place] [address]  Phosphorus     Status: None   Collection Time: 05/13/2022  6:31 AM  Result Value Ref Range   Phosphorus 3.2 2.5 - 4.6 mg/dL    Comment: Performed at [facility] [address] [place], [place] [address]  TSH     Status: Abnormal   Collection Time: 05/11/2022  6:31 AM  Result Value Ref Range   TSH 8.334 (H) 0.350 - 4.500 uIU/mL    Comment: Performed by a 3rd Generation assay with a functional sensitivity of <=0.01 uIU/mL. Performed at [facility], [place] [address] [place], [place] [address]   Triglycerides     Status: None   Collection Time: 05/12/2022  6:31 AM  Result Value Ref Range   Triglycerides 98 <150 mg/dL     Comment: Performed at [facility] [address] [place], [place] [address]  Glucose, capillary     Status: Abnormal   Collection Time: 04/30/2022  7:49 AM  Result Value Ref Range   Glucose-Capillary 205 (H) 70 - 99 mg/dL    Comment: Glucose reference range applies only to samples taken after fasting for at least 8 hours.  Glucose, capillary     Status: None   Collection Time: 05/10/2022  6:29 PM  Result Value Ref Range   Glucose-Capillary 76 70 - 99 mg/dL    Comment: Glucose reference range applies only to samples taken after fasting for at least 8 hours.  Glucose, capillary     Status: None   Collection Time: 05/08/2022 11:03 PM  Result Value Ref Range   Glucose-Capillary 97 70 - 99 mg/dL    Comment: Glucose reference range applies only to samples taken after fasting for  at least 8 hours.  Comprehensive metabolic panel     Status: Abnormal   Collection Time: 04/26/22  3:47 AM  Result Value Ref Range   Sodium 138 135 - 145 mmol/L   Potassium 6.0 (H) 3.5 - 5.1 mmol/L    Comment: DELTA CHECK NOTED   Chloride 110 98 - 111 mmol/L   CO2 14 (L) 22 - 32 mmol/L   Glucose, Bld 119 (H) 70 - 99 mg/dL    Comment: Glucose reference range applies only to samples taken after fasting for at least 8 hours.   BUN 26 (H) 8 - 23 mg/dL   Creatinine, Ser 1.48 (H) 0.44 - 1.00 mg/dL   Calcium 8.0 (L) 8.9 - 10.3 mg/dL   Total Protein 4.6 (L) 6.5 - 8.1 g/dL   Albumin 2.3 (L) 3.5 - 5.0 g/dL   AST 2,012 (H) 15 - 41 U/L   ALT 809 (H) 0 - 44 U/L   Alkaline Phosphatase 196 (H) 38 - 126 U/L   Total Bilirubin 2.2 (H) 0.3 - 1.2 mg/dL   GFR, Estimated 36 (L) >60 mL/min    Comment: (NOTE) Calculated using the CKD-EPI Creatinine Equation (2021)    Anion gap 14 5 - 15    Comment: Performed at [facility], [place] [address] [place], [place] [address]  Magnesium     Status: None   Collection Time: 04/26/22  3:47 AM  Result Value Ref Range   Magnesium 2.2 1.7 - 2.4 mg/dL    Comment:  Performed at [facility], [place] [address] [place], [place] [address]  CBC     Status: Abnormal   Collection Time: 04/26/22  3:47 AM  Result Value Ref Range   WBC 4.1 4.0 - 10.5 K/uL   RBC 3.54 (L) 3.87 - 5.11 MIL/uL   Hemoglobin 10.5 (L) 12.0 - 15.0 g/dL   HCT 33.6 (L) 36.0 - 46.0 %   MCV 94.9 80.0 - 100.0 fL   MCH 29.7 26.0 - 34.0 pg   MCHC 31.3 30.0 - 36.0 g/dL   RDW 21.6 (H) 11.5 - 15.5 %   Platelets 172 150 - 400 K/uL   nRBC 14.8 (H) 0.0 - 0.2 %    Comment: Performed at [facility], [place] [address], [place], [place] [address]  CK     Status: None   Collection Time: 04/26/22  3:47 AM  Result Value Ref Range   Total CK 54 38 - 234 U/L    Comment: Performed at [facility], [place] [address], [place], [place] [address]  Glucose, capillary     Status: Abnormal   Collection Time: 04/26/22  5:03 AM  Result Value Ref Range   Glucose-Capillary 119 (H) 70 - 99 mg/dL    Comment: Glucose reference range applies only to samples taken after fasting for at least 8 hours.  Glucose, capillary     Status: Abnormal   Collection Time: 04/26/22  7:19 AM  Result Value Ref Range   Glucose-Capillary 108 (H) 70 - 99 mg/dL    Comment: Glucose reference range applies only to samples taken after fasting for at least 8 hours.  Glucose, capillary     Status: Abnormal   Collection Time: 04/26/22  8:08 AM  Result Value Ref Range   Glucose-Capillary 120 (H) 70 - 99 mg/dL    Comment: Glucose reference range applies only to samples taken after fasting for at least 8 hours.    ECG  Afib with aberrancy - Personally Reviewed  Telemetry   Sinus rhythm? - Personally Reviewed  Radiology   US Abdomen Complete  Result Date: 04/24/2022 CLINICAL DATA:  Transaminitis. EXAM: ABDOMEN ULTRASOUND COMPLETE COMPARISON:  Right upper quadrant ultrasound dated Feb 21, 2022. CT abdomen pelvis dated July 08, 2021. FINDINGS: Gallbladder: Sludge. No gallstones.  Asymmetric gallbladder wall thickening up to 5-6 mm. Pericholecystic fluid. No sonographic Murphy sign noted by sonographer. Common bile duct: Diameter: 2 mm, normal. Liver: No focal lesion identified. Heterogeneous parenchymal echogenicity. Portal vein is patent on color Doppler imaging with normal direction of blood flow towards the liver. IVC: No abnormality visualized. Pancreas: Visualized portion unremarkable. Spleen: Size and appearance within normal limits. Right Kidney: Length: 9.1 cm. Echogenicity within normal limits. No mass or hydronephrosis visualized. Left Kidney: Not visualized due to overlying bowel gas. Abdominal aorta: No aneurysm visualized. Other findings: None. IMPRESSION: 1. Heterogeneous hepatic parenchyma, suspicious for hepatitis given elevated LFTs. 2. Gallbladder sludge with asymmetric wall thickening and pericholecystic fluid. No sonographic Murphy sign. Appearance may be related to underlying liver disease. If there is clinical concern for cholecystitis, consider HIDA scan for further evaluation. 3. Nonvisualization of the left kidney due to overlying bowel gas. Electronically Signed   By: Titus Dubin M.D.   On: 04/26/2022 16:09   CT SOFT TISSUE NECK W CONTRAST  Result Date: 04/23/2022 CLINICAL DATA:  Soft tissue swelling, infection suspected, neck xray done EXAM: CT NECK WITH CONTRAST TECHNIQUE: Multidetector CT imaging of the neck was performed using the standard protocol following the bolus administration of intravenous contrast. RADIATION DOSE REDUCTION: This exam was performed according to the departmental dose-optimization program which includes automated exposure control, adjustment of the mA and/or kV according to patient size and/or use of iterative reconstruction technique. CONTRAST:  104m OMNIPAQUE IOHEXOL 300 MG/ML  SOLN COMPARISON:  None Available. FINDINGS: Motion limited. Pharynx and larynx: Mild hypoattenuation at the base of tongue and vallecula could be  artifactual on this motion limited study. Otherwise, no visible edema or mass lesion with evaluation limited by streak artifact from dental amalgam and motion. Unremarkable epiglottis. Salivary glands: No inflammation, mass, or stone. Thyroid: Normal. Lymph nodes: None enlarged or abnormal density. Vascular: Negative. Limited intracranial: Negative. Visualized orbits: Negative. Mastoids and visualized paranasal sinuses: Air-fluid levels in bilateral sphenoid sinuses with mucosal thickening of the right frontal and left maxillary sinuses. Skeleton: Mild severe multilevel degenerative change. Upper chest: Patchy opacities in bilateral lung apices. IMPRESSION: 1. Mild hypoattenuation at the base of tongue and vallecula could be artifactual on this motion limited study. Given reported ENT consultation, recommend correlation with direct inspection. 2. Patchy opacities in bilateral lung apices. See recent CT of the chest for further evaluation. Electronically Signed   By: FMargaretha SheffieldM.D.   On: 04/27/2022 14:19    Cardiac Studies   N/A  Impression   Principal Problem:   Hip fracture (HCC) Active Problems:   Hypothyroidism   Grade 3a follicular lymphoma of lymph nodes of multiple regions (HCC)   Acute kidney injury superimposed on chronic kidney disease (HCC)   Sepsis (HCC)   Hyperkalemia   Essential hypertension   Leukopenia   Pulmonary hypertension (HCC)   Acquired hypercoagulable state ([place]   Chronic diastolic CHF (congestive heart failure) (HCC)   Atrial fibrillation, chronic (HCC)   Hypotension   Hyponatremia   Hypomagnesemia   Acute on chronic respiratory failure with hypoxia (HCC)   Acute blood loss anemia   UTI (urinary tract infection)  Myocardial injury   Acute metabolic encephalopathy   Hypocalcemia   CAP (community acquired pneumonia)   Dysphagia   Transaminitis   Recommendation   Mrs. Longer has multiorgan system dysfunction.  It appears that she is declining.  She  was also not eating well prior to admission.  She was in A-fib and having periods of aberrancy and now appears to be in a more regular rhythm. Will repeat an EKG today. HR is controlled. Agree with holding amiodarone given transaminitis - fortunately it has a long half-life. Prognosis appears poor- palliative care discussion is appropriate.  Time Spent Directly with Patient:  I have spent a total of 45 minutes with the patient reviewing hospital notes, telemetry, EKGs, labs and examining the patient as well as establishing an assessment and plan that was discussed personally with the patient.  > 50% of time was spent in direct patient care.  Length of Stay:  LOS: 8 days   Pixie Casino, MD, [facility], [place] Director of the Advanced Lipid Disorders &  Cardiovascular Risk Reduction Clinic Diplomate of the American Board of Clinical Lipidology Attending Cardiologist  Direct Dial: [phone]  Fax: [phone]  Website:  [email or web address] Akshaya Toepfer 04/26/2022, 9:52 AM

## 2022-04-26 NOTE — Progress Notes (Signed)
SLP Cancellation Note  Patient Details Name: Carrie [name] MRN: [id number] DOB: 1941-03-28   Cancelled treatment:       Reason Eval/Treat Not Completed: Medical issues which prohibited therapy  Per RN, pt not appropriate for reassessment of swallow function at this time. Will continue efforts. Pt currently receiving clear liquid diet.  Azlyn Wingler B. Quentin Ore, [facility], [place] Speech Language Pathologist Office: [phone]  Shonna Chock 04/26/2022, 3:07 PM

## 2022-04-27 ENCOUNTER — Encounter (HOSPITAL_COMMUNITY): Payer: Self-pay | Admitting: Gastroenterology

## 2022-04-27 DIAGNOSIS — G9341 Metabolic encephalopathy: Secondary | ICD-10-CM | POA: Diagnosis not present

## 2022-04-27 DIAGNOSIS — D6869 Other thrombophilia: Secondary | ICD-10-CM | POA: Diagnosis not present

## 2022-04-27 DIAGNOSIS — I4819 Other persistent atrial fibrillation: Secondary | ICD-10-CM

## 2022-04-27 DIAGNOSIS — S72002A Fracture of unspecified part of neck of left femur, initial encounter for closed fracture: Secondary | ICD-10-CM | POA: Diagnosis not present

## 2022-04-27 DIAGNOSIS — D62 Acute posthemorrhagic anemia: Secondary | ICD-10-CM | POA: Diagnosis not present

## 2022-04-27 LAB — BASIC METABOLIC PANEL
Anion gap: 10 (ref 5–15)
BUN: 37 mg/dL — ABNORMAL HIGH (ref 8–23)
CO2: 20 mmol/L — ABNORMAL LOW (ref 22–32)
Calcium: 8.4 mg/dL — ABNORMAL LOW (ref 8.9–10.3)
Chloride: 113 mmol/L — ABNORMAL HIGH (ref 98–111)
Creatinine, Ser: 2.09 mg/dL — ABNORMAL HIGH (ref 0.44–1.00)
GFR, Estimated: 24 mL/min — ABNORMAL LOW (ref >60)
Glucose, Bld: 206 mg/dL — ABNORMAL HIGH (ref 70–99)
Potassium: 4.7 mmol/L (ref 3.5–5.1)
Sodium: 143 mmol/L (ref 135–145)

## 2022-04-27 LAB — CBC
HCT: 31.2 % — ABNORMAL LOW (ref 36.0–46.0)
Hemoglobin: 9.8 g/dL — ABNORMAL LOW (ref 12.0–15.0)
MCH: 29.7 pg (ref 26.0–34.0)
MCHC: 31.4 g/dL (ref 30.0–36.0)
MCV: 94.5 fL (ref 80.0–100.0)
Platelets: 123 10*3/uL — ABNORMAL LOW (ref 150–400)
RBC: 3.3 MIL/uL — ABNORMAL LOW (ref 3.87–5.11)
RDW: 21.9 % — ABNORMAL HIGH (ref 11.5–15.5)
WBC: 4.2 10*3/uL (ref 4.0–10.5)
nRBC: 29.2 % — ABNORMAL HIGH (ref 0.0–0.2)

## 2022-04-27 LAB — GLUCOSE, CAPILLARY
Glucose-Capillary: 187 mg/dL — ABNORMAL HIGH (ref 70–99)
Glucose-Capillary: 202 mg/dL — ABNORMAL HIGH (ref 70–99)
Glucose-Capillary: 166 mg/dL — ABNORMAL HIGH (ref 70–99)

## 2022-04-27 LAB — HEPATIC FUNCTION PANEL
ALT: 1301 U/L — ABNORMAL HIGH (ref 0–44)
AST: 3102 U/L — ABNORMAL HIGH (ref 15–41)
Albumin: 2.5 g/dL — ABNORMAL LOW (ref 3.5–5.0)
Alkaline Phosphatase: 241 U/L — ABNORMAL HIGH (ref 38–126)
Bilirubin, Direct: 0.8 mg/dL — ABNORMAL HIGH (ref 0.0–0.2)
Indirect Bilirubin: 0.8 mg/dL (ref 0.3–0.9)
Total Bilirubin: 1.6 mg/dL — ABNORMAL HIGH (ref 0.3–1.2)
Total Protein: 4.5 g/dL — ABNORMAL LOW (ref 6.5–8.1)

## 2022-04-27 LAB — PHOSPHORUS: Phosphorus: 4.3 mg/dL (ref 2.5–4.6)

## 2022-04-27 LAB — MAGNESIUM: Magnesium: 2.2 mg/dL (ref 1.7–2.4)

## 2022-04-27 MED ORDER — FENTANYL CITRATE PF 50 MCG/ML IJ SOSY: 25.0000 ug | PREFILLED_SYRINGE | INTRAMUSCULAR | Status: DC | PRN

## 2022-04-27 MED ORDER — MORPHINE SULFATE (PF) 2 MG/ML IV SOLN
1.0000 mg | INTRAVENOUS | Status: DC | PRN
  Administered 2022-04-27 (×3): 1 mg via INTRAVENOUS
  Administered 2022-04-28 (×3): 2 mg via INTRAVENOUS
  Administered 2022-04-28 (×3): 1 mg via INTRAVENOUS
  Administered 2022-04-29 (×4): 2 mg via INTRAVENOUS
  Filled 2022-04-27 (×13): qty 1

## 2022-04-27 MED ORDER — LORAZEPAM 2 MG/ML IJ SOLN: 0.5000 mg | Freq: Four times a day (QID) | INTRAMUSCULAR | Status: DC | PRN

## 2022-04-27 MED ORDER — ALBUMIN HUMAN 25 % IV SOLN
12.5000 g | INTRAVENOUS | Status: DC
  Administered 2022-04-27: 12.5 g via INTRAVENOUS
  Filled 2022-04-27: qty 50

## 2022-04-27 MED ORDER — SALINE SPRAY 0.65 % NA SOLN
1.0000 | NASAL | Status: DC | PRN
  Filled 2022-04-27: qty 44

## 2022-04-27 NOTE — Progress Notes (Signed)
   Full comfort care noted. Discussed with family at the bedside. Will sign-off, call with questions.  Pixie Casino, MD, [facility], [place] Director of the Advanced Lipid Disorders &  Cardiovascular Risk Reduction Clinic Diplomate of the American Board of Clinical Lipidology Attending Cardiologist  Direct Dial: [phone]  Fax: [phone]  Website:  [email or web address]

## 2022-04-27 NOTE — Progress Notes (Signed)
Progress Note   Patient: Carrie [name] [id number]:[id number] DOB: 1941/05/27 DOA: 04/26/2022     9 DOS: the patient was seen and examined on 04/27/2022 at 9:15AM and 4:05PM      Brief hospital course: Mrs. Krienke is a 81 y.o. F with hx follicular lymphoma stage IIIa s/p chemotherapy with pancytopenia, dCHF, cAF on Eliquis, COVID last Feb with chronic cough, NOT on home O2, hypothyroidism, HTN, HLD, stage IIIb CKD who presented with confusion and unwitnessed fall with complaint of left hip pain.   In the ER, patient noted to have hypoxia, new hip fracture.  CTA chest showed no PE but confirmed mild multifocal infiltrates.       7/2: Admitted for hip fracture 7/3: Developed pre-op hypotension due to ABLA requiring 2u PRBCs, stabilised and went to OR in afternoon for ORIF 7/4: Hypotensive again overnight, got 3rd unit PRBCs, started on Levophed briefly, CCM consulted 7/5 transferred out of unit, MBS shows upper esophageal sphincter dysfunction 7/6: GI consulted 7/8: EGD shows very high level esophageal dysfunction, no intervention possible, CT neck unremarkable, ENT consulted 7/9: ENT performed rigid esophagoscopy and dilation and botox injection fo UES succesfully 7/10: Overnight hypotensive, transferred to ICU and BP improved on albumin, CCM consulted 7/11: Cr doubled, LFTs markedly up, mentation worse     Assessment and Plan: * Hip fracture ([place]) Initial presenting complaint.  Hypotension and shock due to hypovolemia Acute renal failure Shock liver Acute metabolic encephalopathy Treated with fluids and albumin and BP improved.  No pressors needed.  Despite resolution, renal failure, liver failure and encephalopathy have worsened (Cr doubled, LFTs up to 3000s).    She appears to be in terminal decline.  Discussed with family today, who are familiar with her wishes and goals of care, and they feel she would have wanted comfort at this time, no further escalation of care. - Stop  antibiotics - Stop tube feeds - Stop lab draws  Hyponatremia - Hold furosemide  Dysphagia See prior summary   Atrial fibrillation, chronic (HCC) - Hold Eliquis - Hold amiodarone  Chronic diastolic CHF (congestive heart failure) (HCC) Essential hypertension - Hold Lasix  metoprolol  Hypothyroidism - Continue levothyroxine           Subjective: Overnight the patient has become less responsive.  She does not open her eyes, her respirations have increased, she appears to be in no pain, she has had no fever.     Physical Exam: Vitals:   04/27/22 1000 04/27/22 1100 04/27/22 1130 04/27/22 1214  BP: (!) 125/98 113/89 100/71   Pulse: 100 (!) 117 (!) 102   Resp: (!) 34 (!) 32 (!) 29   Temp:    98.4 F (36.9 C)  TempSrc:    Axillary  SpO2: 93% 95% 95%   Weight:      Height:       Elderly adult female, now edematous, unresponsive Tachycardic, irregular, systolic murmur noted, diffuse pitting edema in all extremities Respiratory rate increased, lung sounds coarse abdomen without grimace to palpation, no masses Unresponsive, stirs sluggishly to reposition herself, does not open her eyes, does not follow commands, makes no spontaneous verbalizations  Data Reviewed: TSH 8 Ultrasound of the abdomen shows diffuse hepatitis, some gallbladder sludge Glucose 578-469 Basic metabolic panel shows normal sodium, potassium, magnesium, creatinine up to 2.09 AST and ALT up to 3100, 1360 Hemoglobin 9.8, no change     Family Communication: Spoke with son and daughter at the bedside, goals of care discussed, transition to  comfort measures    Disposition: Status is: Inpatient The patient was admitted with hip fracture, appears to be sepsis.  She subsequently was found to have severe dysphagia which appeared to have been ongoing for some time.  In the setting of her chronic follicular lymphoma, A-fib, diastolic CHF, and now severely inadequate oral intake due to dysphagia, the  patient developed renal failure, liver failure, and encephalopathy. Expect in hospital death.          Author: Edwin Dada, MD 04/27/2022 4:07 PM  For on call review [email or web address].

## 2022-04-27 NOTE — Progress Notes (Signed)
PCCM Brief Progress Note  Pressure stable since transfer yesterday after receiving fluids and albumin. Continue supportive care per TRH.  Please call back if we can be of further assistance.   Montey Hora, [facility] For pager details, please see AMION or use Epic chat  After 1900, please call [facility] for cross coverage needs 04/27/2022, 7:03 AM

## 2022-04-27 NOTE — Progress Notes (Signed)
SLP Discharge Note  Patient Details Name: Carrie [name] MRN: [id number] DOB: 03-18-1941   Cancelled treatment:       Reason Eval/Treat Not Completed: Medical issues which prohibited therapy  Per RN, pt is transitioning to comfort care. SLP signing off at this time. Please reconsult if needs arise.   Kimyata Milich B. Quentin Ore, [facility], [place] Speech Language Pathologist Office: [phone]  Shonna Chock 04/27/2022, 1:25 PM

## 2022-04-28 DIAGNOSIS — D6869 Other thrombophilia: Secondary | ICD-10-CM | POA: Diagnosis not present

## 2022-04-28 DIAGNOSIS — D62 Acute posthemorrhagic anemia: Secondary | ICD-10-CM | POA: Diagnosis not present

## 2022-04-28 DIAGNOSIS — G9341 Metabolic encephalopathy: Secondary | ICD-10-CM | POA: Diagnosis not present

## 2022-04-28 DIAGNOSIS — S72002A Fracture of unspecified part of neck of left femur, initial encounter for closed fracture: Secondary | ICD-10-CM | POA: Diagnosis not present

## 2022-04-28 DIAGNOSIS — Z515 Encounter for palliative care: Secondary | ICD-10-CM

## 2022-04-28 MED ORDER — GLYCOPYRROLATE 0.2 MG/ML IJ SOLN
0.2000 mg | INTRAMUSCULAR | Status: DC | PRN
  Administered 2022-04-28: 0.2 mg via INTRAVENOUS
  Filled 2022-04-28: qty 1

## 2022-04-28 MED ORDER — GLYCOPYRROLATE 1 MG PO TABS: 1.0000 mg | ORAL_TABLET | ORAL | Status: DC | PRN

## 2022-04-28 MED ORDER — GLYCOPYRROLATE 0.2 MG/ML IJ SOLN: 0.2000 mg | INTRAMUSCULAR | Status: DC | PRN

## 2022-04-28 NOTE — Progress Notes (Signed)
   Palliative Medicine Inpatient Follow Up Note   Palliative care has acknowledged the consult for Carrie [name].  Per chart review last evening Carrie [name] was made comfort care.  VS indicate death will occur in hospice.  Comfort orders placed and goals solidified by Hospitalist, Dr. Loleta Books.  Per secure chat this morning it was identified that goals are clear and the Palliative care team can sign off on Carrie [name].  We are available if additional needs are identified.  No Charge ______________________________________________________________________________________ [facility] Team Team Cell Phone: [phone] Please utilize secure chat with additional questions, if there is no response within 30 minutes please call the above phone number  Palliative Medicine Team providers are available by phone from 7am to 7pm daily and can be reached through the team cell phone.  Should this patient require assistance outside of these hours, please call the patient's attending physician.

## 2022-04-28 NOTE — Progress Notes (Signed)
  Progress Note   Patient: Carrie [name] [id number]:[id number] DOB: Oct 26, 1940 DOA: 05/11/2022     10 DOS: the patient was seen and examined on 04/28/2022 at 8:10AM      Brief hospital course: Mrs. Mcmanamon is a 81 y.o. 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      Assessment and Plan: * Renal failure and hepatic failure In discussion with family yesterday, given worsening prognosis and the high degree of certainty of the terminal nature of this illness, medical treatments were redirected towards comfort, and further life prolonging treatments were withdrawn in accordance with patient's previously expressed wishes.  The patient's blood pressure has trended down overnight.  Her breathing appears somewhat worse.  No change in mentation.   - Morphine for pain or dyspnea - Ativan for anxiety or agitation - Robinul as needed - Turn q2 - Oral care - Comfort measures, expect in hospital death          Subjective: The patient is unresponsive.  Her blood pressure and respirations have worsened overnight     Physical Exam: Vitals:   04/28/22 0400 04/28/22 0500 04/28/22 0600 04/28/22 0700  BP: (!) 55/27  (!) 68/42   Pulse: (!) 118 (!) 105 (!) 111 (!) 108  Resp: (!) 22 (!) 22 (!) 22 (!) 21  Temp:      TempSrc:      SpO2: 97% 98% 98% 97%  Weight:      Height:       Elderly adult female, anasarcic, unresponsive Tachycardic, heart sounds distant Respirations shallow, coarse, tachypneic. No grimace to palpation of the abdomen No response to stimulation  Data Reviewed: No new labs, discussed with palliative care  Family Communication: Daughter at the bedside    Disposition: Status is: Inpatient Expect imminent deterioration and likely in-hospital  death        Author: Edwin Dada, MD 04/28/2022 8:35 AM  For on call review [email or web address].

## 2022-04-29 DIAGNOSIS — S72002A Fracture of unspecified part of neck of left femur, initial encounter for closed fracture: Secondary | ICD-10-CM | POA: Diagnosis not present

## 2022-04-29 MED ORDER — MORPHINE 100MG IN NS 100ML (1MG/ML) PREMIX INFUSION
1.0000 mg/h | INTRAVENOUS | Status: DC
  Administered 2022-04-29: 1 mg/h via INTRAVENOUS
  Filled 2022-04-29: qty 100

## 2022-04-29 MED ORDER — LORAZEPAM 2 MG/ML IJ SOLN
0.5000 mg | INTRAMUSCULAR | Status: DC | PRN
  Administered 2022-04-29: 0.5 mg via INTRAVENOUS
  Administered 2022-04-29: 1 mg via INTRAVENOUS
  Filled 2022-04-29 (×2): qty 1

## 2022-04-29 MED ORDER — MORPHINE SULFATE (PF) 2 MG/ML IV SOLN
2.0000 mg | INTRAVENOUS | Status: DC | PRN
  Filled 2022-04-29: qty 1

## 2022-05-04 ENCOUNTER — Other Ambulatory Visit: Payer: Medicare HMO

## 2022-05-04 ENCOUNTER — Ambulatory Visit: Payer: Medicare HMO | Admitting: Cardiology

## 2022-05-18 NOTE — Progress Notes (Signed)
Pt time of death at 33. Family present at bedside. All valuables returned with patients family. NP notified. Pronounced by this RN and [employer].

## 2022-05-18 NOTE — Death Summary Note (Signed)
DEATH SUMMARY   Patient Details  Name: Carrie [name] MRN: [id number] DOB: April 06, 1941 MCN:[id number], [name] Admission/Discharge Information   Admit Date:  May 04, 2022  Date of Death: Date of Death: 15-May-2022  Time of Death: Time of Death: 15-Jan-1932  Length of Stay: 12-Jan-2023   Principle Cause of death: Renal failure  Hospital Diagnoses: Principal Problem:   Hip fracture ([place]) Active Problems:   Hypotension   Acute kidney injury superimposed on chronic kidney disease (HCC)   Hyperkalemia   Hyponatremia   Dysphagia   Acute metabolic encephalopathy   Transaminitis   Hypothyroidism   Grade 3a follicular lymphoma of lymph nodes of multiple regions (HCC)   Sepsis (HCC)   Essential hypertension   Leukopenia   Pulmonary hypertension (HCC)   Acquired hypercoagulable state ([place])   Chronic diastolic CHF (congestive heart failure) (HCC)   Atrial fibrillation, chronic (HCC)   Hypomagnesemia   Acute on chronic respiratory failure with hypoxia (HCC)   Acute blood loss anemia   UTI (urinary tract infection)   Myocardial injury   Hypocalcemia   CAP (community acquired pneumonia)   Malnutrition of moderate degree   Hospital Course: Carrie [name] is a 81 y.o. F with hx follicular lymphoma stage IIIa s/p chemotherapy with pancytopenia, dCHF, cAF on Eliquis, COVID last Feb with chronic cough, NOT on home O2, hypothyroidism, HTN, HLD, stage IIIb CKD who presented with confusion and unwitnessed fall with complaint of left hip pain.   In the ER, patient noted to have hypoxia, new hip fracture.  CTA chest showed no PE but confirmed mild multifocal infiltrates.     Admitted for hip fracture but developed pre-op hypotension due to ABLA requiring 2u PRBCs, stabilised and went to OR for ORIF.  Uneventful surgery, but post-op course complicated by discovery of dysphagia which had been ongoing since several weeks or even months before admission.    Evaluated by GI and ENT who discovered an upper  esophageal stricture.  On initial EGD, GI were unable to pass their diagnostic endoscope and actually had to use a pediatric scope.  Subsequently, ENT performed esophagoscopy, dilation of the UES and Botox injection in the OR  Unfortunately, post-op the second time, the patient's mentation never improved.  She showed signs of new renal failure and liver failure.  In discussion with family, she would not have wanted extreme life prolonging measures, and in light of her worsening prognosis, was transitioned to comfort care and passed with family at her side.            Procedures:  - Open reduction and internal fixation of left intertrochanteric femur fracture utilizing the Biomet Affixus nail, 11 x 180 mm with a 130-degree lag screw with the distal interlock. - Upper GI endoscopy - Rigid esophagoscopy with dilation and Botox injection  Consultations: Orthopedics, Critical Care, Gastroenterology, ENT, Cardiology  The results of significant diagnostics from this hospitalization (including imaging, microbiology, ancillary and laboratory) are listed below for reference.   Significant Diagnostic Studies: US Abdomen Complete  Result Date: 05/13/2022 CLINICAL DATA:  Transaminitis. EXAM: ABDOMEN ULTRASOUND COMPLETE COMPARISON:  Right upper quadrant ultrasound dated Feb 21, 2022. CT abdomen pelvis dated July 08, 2021. FINDINGS: Gallbladder: Sludge. No gallstones. Asymmetric gallbladder wall thickening up to 5-6 mm. Pericholecystic fluid. No sonographic Murphy sign noted by sonographer. Common bile duct: Diameter: 2 mm, normal. Liver: No focal lesion identified. Heterogeneous parenchymal echogenicity. Portal vein is patent on color Doppler imaging with normal direction of blood flow towards the liver.  IVC: No abnormality visualized. Pancreas: Visualized portion unremarkable. Spleen: Size and appearance within normal limits. Right Kidney: Length: 9.1 cm. Echogenicity within normal limits. No mass or  hydronephrosis visualized. Left Kidney: Not visualized due to overlying bowel gas. Abdominal aorta: No aneurysm visualized. Other findings: None. IMPRESSION: 1. Heterogeneous hepatic parenchyma, suspicious for hepatitis given elevated LFTs. 2. Gallbladder sludge with asymmetric wall thickening and pericholecystic fluid. No sonographic Murphy sign. Appearance may be related to underlying liver disease. If there is clinical concern for cholecystitis, consider HIDA scan for further evaluation. 3. Nonvisualization of the left kidney due to overlying bowel gas. Electronically Signed   By: Titus Dubin M.D.   On: 04/21/2022 16:09   CT SOFT TISSUE NECK W CONTRAST  Result Date: 05/10/2022 CLINICAL DATA:  Soft tissue swelling, infection suspected, neck xray done EXAM: CT NECK WITH CONTRAST TECHNIQUE: Multidetector CT imaging of the neck was performed using the standard protocol following the bolus administration of intravenous contrast. RADIATION DOSE REDUCTION: This exam was performed according to the departmental dose-optimization program which includes automated exposure control, adjustment of the mA and/or kV according to patient size and/or use of iterative reconstruction technique. CONTRAST:  54m OMNIPAQUE IOHEXOL 300 MG/ML  SOLN COMPARISON:  None Available. FINDINGS: Motion limited. Pharynx and larynx: Mild hypoattenuation at the base of tongue and vallecula could be artifactual on this motion limited study. Otherwise, no visible edema or mass lesion with evaluation limited by streak artifact from dental amalgam and motion. Unremarkable epiglottis. Salivary glands: No inflammation, mass, or stone. Thyroid: Normal. Lymph nodes: None enlarged or abnormal density. Vascular: Negative. Limited intracranial: Negative. Visualized orbits: Negative. Mastoids and visualized paranasal sinuses: Air-fluid levels in bilateral sphenoid sinuses with mucosal thickening of the right frontal and left maxillary sinuses. Skeleton:  Mild severe multilevel degenerative change. Upper chest: Patchy opacities in bilateral lung apices. IMPRESSION: 1. Mild hypoattenuation at the base of tongue and vallecula could be artifactual on this motion limited study. Given reported ENT consultation, recommend correlation with direct inspection. 2. Patchy opacities in bilateral lung apices. See recent CT of the chest for further evaluation. Electronically Signed   By: FMargaretha SheffieldM.D.   On: 04/25/2022 14:19   DG FEMUR MIN 2 VIEWS LEFT  Result Date: 04/22/2022 CLINICAL DATA:  Pain EXAM: LEFT FEMUR 2 VIEWS COMPARISON:  08/01/2018, 05/01/2022 FINDINGS: Frontal and lateral views of the left femur are obtained. ORIF from recent intertrochanteric left hip fracture repair, with near anatomic alignment. The left hip and knee are in anatomic alignment without subluxation or dislocation. Mild 3 compartmental left knee osteoarthritis. There is diffuse subcutaneous edema within the left lower extremity, with postsurgical changes surrounding the left hip arthroplasty. IMPRESSION: 1. ORIF left hip as above. 2. 3 compartmental left knee osteoarthritis. 3. Diffuse lower extremity edema. Electronically Signed   By: MRanda NgoM.D.   On: 04/22/2022 15:59   DG HIP UNILAT WITH PELVIS 2-3 VIEWS LEFT  Result Date: 04/22/2022 CLINICAL DATA:  Femur fracture EXAM: DG HIP (WITH OR WITHOUT PELVIS) 2-3V LEFT COMPARISON:  Radiograph 05/07/2022 FINDINGS: Intramedullary nail fixation intertrochanteric femur fracture. Dynamic compression screw spans the LEFT femoral neck. Distal interlocking screw in the proximal femoral diaphysis. Avulsion of the lesser trochanter. No dislocation. Posterior lumbar fusion IMPRESSION: ORIF LEFT intertrochanteric fracture without complication. Electronically Signed   By: SSuzy BouchardM.D.   On: 04/22/2022 15:59   DG Swallowing Func-Speech Pathology  Result Date: 04/22/2022 Table formatting from the original result was not included.  Objective Swallowing  Evaluation: Type of Study: MBS-Modified Barium Swallow Study  Patient Details Name: KATIE FARAONE MRN: [id number] Date of Birth: [birth year] Today's Date: 04/22/2022 Time: SLP Start Time (ACUTE ONLY): 1215 -SLP Stop Time (ACUTE ONLY): 5681 SLP Time Calculation (min) (ACUTE ONLY): 30 min Past Medical History: Past Medical History: Diagnosis Date  A-fib ([place])   Anemia   pt denies   Atrial fibrillation with rapid ventricular response ([place]) 06/25/2015  Carpal tunnel syndrome, bilateral   pt denies   Cervical spondylosis without myelopathy 10/25/2013  Dental bridge present   DJD (degenerative joint disease)   Dyspnea   sometimes hx of cancer non hodgkins lymphoma   Dysrhythmia   WENT INTO A [facility] IN 2017   Elevated troponin 11/24/5168  Follicular lymphoma grade 3a ([place]) 02/03/2015  hx of nonhodgkins lymphoma x 3   Headache   sometimes a headache   History of echocardiogram   Echo 12/16: EF 60-65%, no RWMA, severe LAE  History of nuclear stress test   Myoview 1/17: EF 55%, Normal study. No ischemia or scar.  Hypertension   Hypothyroid   Nausea without vomiting 06/20/2015  Stage III chronic kidney disease ([place]) 07/01/2015  Thrush of mouth and esophagus ([place]) 06/25/2015 Past Surgical History: Past Surgical History: Procedure Laterality Date  ABDOMINAL HYSTERECTOMY    APPENDECTOMY    BACK SURGERY    X5-lumbar-fusion  CARDIOVERSION N/A 02/17/2022  Procedure: CARDIOVERSION;  Surgeon: Berniece Salines, DO;  Location: [facility];  Service: Cardiovascular;  Laterality: N/A;  COLONOSCOPY    DILATION AND CURETTAGE OF UTERUS    INTRAMEDULLARY (IM) NAIL INTERTROCHANTERIC Left 05/01/2022  Procedure: INTRAMEDULLARY (IM) NAIL INTERTROCHANTRIC;  Surgeon: Paralee Cancel, MD;  Location: [facility];  Service: Orthopedics;  Laterality: Left;  Biomet short nail, [place]  LYMPH NODE BIOPSY Right 01/21/2015  Procedure: RIGHT GROIN LYMPH NODE BIOPSY;  Surgeon: Erroll Luna, MD;  Location: [facility];  Service: General;  Laterality: Right;   MASS EXCISION Left 08/29/2018  Procedure: EXCISION LEFT BACK  MASS;  Surgeon: Erroll Luna, MD;  Location: [facility];  Service: General;  Laterality: Left;  Ovarian cyst resection    patelar tendon transplants    Left/right  PORTACATH PLACEMENT Right 02/13/2015  Procedure: INSERTION PORT-A-CATH WITH ULTRASOUND;  Surgeon: Erroll Luna, MD;  Location: [facility];  Service: General;  Laterality: Right;  PORTACATH PLACEMENT N/A 08/29/2018  Procedure: INSERTION PORT-A-CATH WITH ULTRA SOUND ERAS PATHWAY;  Surgeon: Erroll Luna, MD;  Location: [facility];  Service: General;  Laterality: N/A;  REVERSE SHOULDER ARTHROPLASTY Left 03/02/2021  Procedure: REVERSE SHOULDER ARTHROPLASTY;  Surgeon: Netta Cedars, MD;  Location: [facility];  Service: Orthopedics;  Laterality: Left;  RIGHT/LEFT HEART CATH AND CORONARY ANGIOGRAPHY N/A 01/15/2022  Procedure: RIGHT/LEFT HEART CATH AND CORONARY ANGIOGRAPHY;  Surgeon: [name], MD;  Location: [facility];  Service: Cardiovascular;  Laterality: N/A;  TOTAL KNEE ARTHROPLASTY  2011  Right  TUBAL LIGATION   HPI: 81 yo female [facility], She is s/p left hip ORIF.  PMH + for non-Hodgkins lymphoma, COVID 19, chronic small vessel disease per CT brain - for MRI ? today. Pt also now with rhinovirus and is on droplet precautions.  Swallow evaluation ordered due to pt having coughing with intake and reporting dysphagia.   Pt attributes some of her recent weight loss to dysphagia. CT chest  showed Infiltrate or atelectasis is seen posterior lower  lobes bilaterally. Multifocal ill-defined areas of airspace opacity  are also seen in the upper  lung zones bilaterally no evidence of  pleural effusion.  Subjective: pt awake in flouro chair  Recommendations for follow up therapy are one component of a multi-disciplinary discharge planning process, led by the attending physician.  Recommendations may be updated based on patient status, additional functional criteria and insurance authorization.  Assessment / Plan / Recommendation   04/22/2022   3:18 PM Clinical Impressions Clinical Impression Patient presents with mild oral and severe cervical esophageal dysphagia, ? acute on chronic (last 2 months per pt) in nature.  Prolonged oral transiting present likely due to current mentation - or possibly secondary.  UES opening was grossly impaired resulting in no clearance of pudding through cervical esophagus- with pt sensation.  She produced reflexive cough and orally expectorated large amount of pharyngeal retention.  Thin and nectar via cup was aspirated due to pharyngeal retention from poor UES opening.  SLP tested thin, nectar and pudding boluses only.  Pharyngeal motility, tongue base retraction and laryngeal elevation/closure are all adequate, thus primary dysphagia appears to be cervical esophageal.  Pt's current dysphagia does not allow adequate nutrition and airway protection is concerning especially given acute medical illness with hip fracture s/p surgery and impaired mobility.  Patient reports sudden onset of dysphagia several months ago and attributes some of her reported weight loss to her dysphagia.  Recommend patient consume tsps of thin and nectar liquids only pending GI referral.  Hopeful that GI can intervene to improve patient's functional swallowing significantly.   Note MRI of brain is negative for an acute event per chart review.  Educated pt, RN and spoke to MD following exam.  SLP will follow up to determine if can be of any furhter assistance to patient regarding her swallow function. SLP Visit Diagnosis Dysphagia, oral phase (R13.11);Dysphagia, pharyngoesophageal phase (R13.14) Impact on safety and function Risk for inadequate nutrition/hydration;Severe aspiration risk     04/22/2022   3:18 PM Treatment Recommendations Treatment Recommendations Therapy as outlined in treatment plan below     04/22/2022   3:21 PM Prognosis Prognosis for Safe Diet Advancement Good Barriers to Reach Goals Other  (Comment);Time post onset;Cognitive deficits;Severity of deficits   04/22/2022   3:18 PM Diet Recommendations SLP Diet Recommendations Thin liquid;Nectar thick liquid Medication Administration Other (Comment) Compensations Slow rate;Small sips/bites     04/22/2022   3:18 PM Other Recommendations Oral Care Recommendations Oral care QID Follow Up Recommendations No SLP follow up Assistance recommended at discharge None Functional Status Assessment Patient has had a recent decline in their functional status and demonstrates the ability to make significant improvements in function in a reasonable and predictable amount of time.   04/22/2022   3:18 PM Frequency and Duration  Speech Therapy Frequency (ACUTE ONLY) min 1 x/week Treatment Duration 1 week     04/22/2022   3:14 PM Oral Phase Oral Phase Impaired Oral - Nectar Teaspoon Delayed oral transit Oral - Nectar Cup Delayed oral transit Oral - Nectar Straw Delayed oral transit Oral - Thin Teaspoon Delayed oral transit Oral - Thin Cup Delayed oral transit Oral - Thin Straw NT Oral - Puree Delayed oral transit    04/22/2022   3:14 PM Pharyngeal Phase Pharyngeal Phase Impaired Pharyngeal- Nectar Teaspoon Pharyngeal residue - pyriform Pharyngeal- Nectar Cup Pharyngeal residue - pyriform Pharyngeal- Thin Teaspoon WFL Pharyngeal Material does not enter airway Pharyngeal- Thin Cup Pharyngeal residue - cp segment Pharyngeal Material enters airway, passes BELOW cords and not ejected out despite cough attempt by patient Pharyngeal- Thin Straw NT Pharyngeal-  Puree Pharyngeal residue - cp segment Pharyngeal Material enters airway, remains ABOVE vocal cords then ejected out    04/22/2022   3:17 PM Cervical Esophageal Phase  Cervical Esophageal Phase Impaired Nectar Teaspoon Reduced cricopharyngeal relaxation;Esophageal backflow into the pharynx Nectar Cup Reduced cricopharyngeal relaxation;Esophageal backflow into the pharynx;Esophageal backflow into the larynx Thin Teaspoon Reduced  cricopharyngeal relaxation;Esophageal backflow into cervical esophagus Thin Cup Reduced cricopharyngeal relaxation;Esophageal backflow into the pharynx;Esophageal backflow into the larynx Puree Reduced cricopharyngeal relaxation;Esophageal backflow into the pharynx Kathleen Lime, MS [facility] SLP Acute Rehab Services Office [phone] Pager [phone] [name] 04/22/2022, 3:22 PM                     MR BRAIN WO CONTRAST  Result Date: 04/22/2022 CLINICAL DATA:  Neuro deficit. New onset confusion after hip replacement EXAM: MRI HEAD WITHOUT CONTRAST TECHNIQUE: Multiplanar, multiecho pulse sequences of the brain and surrounding structures were obtained without intravenous contrast. COMPARISON:  Head CT from 4 days ago FINDINGS: Brain: No acute infarction, hemorrhage, hydrocephalus, extra-axial collection or mass lesion. Extensive chronic small vessel ischemia in the supratentorial white matter with confluent gliosis and scattered deep white matter lacunes. Moderate cerebral volume loss. Few scattered remote microhemorrhages without specific pattern. Vascular: Normal flow voids. Skull and upper cervical spine: No focal marrow lesion. Cervical spine degeneration with C3-4 anterolisthesis. Sinuses/Orbits: Patchy sinus opacification with bilateral sphenoid sinus and right mastoid fluid levels. Other than mucosal thickening at the floor the left maxillary sinus, this opacification is new. Other: Intermittent motion artifact. IMPRESSION: 1. No acute or reversible finding. 2. Brain atrophy and extensive chronic small vessel ischemia. 3. Sinus and right mastoid opacification since admission head CT. Electronically Signed   By: Jorje Guild M.D.   On: 04/22/2022 11:54   DG HIP UNILAT WITH PELVIS 2-3 VIEWS LEFT  Result Date: 05/03/2022 CLINICAL DATA:  Left femoral intramedullary nail. EXAM: DG HIP (WITH OR WITHOUT PELVIS) 2-3V LEFT COMPARISON:  Preoperative radiograph yesterday FINDINGS: Three fluoroscopic spot views  of the left hip obtained in the operating room. Short intramedullary rod with trans trochanteric and distal screw fixation of intertrochanteric femur fracture. Fluoroscopy time 44 seconds. Dose 17.34 mGy. IMPRESSION: Procedural fluoroscopy for ORIF of left intertrochanteric femur fracture. Electronically Signed   By: Keith Rake M.D.   On: 05/16/2022 19:50   DG C-Arm 1-60 Min-No Report  Result Date: 04/27/2022 Fluoroscopy was utilized by the requesting physician.  No radiographic interpretation.   CT Angio Chest Pulmonary Embolism (PE) W or WO Contrast  Result Date: 04/23/2022 CLINICAL DATA:  Recent hip fracture. High probability for pulmonary embolism. EXAM: CT ANGIOGRAPHY CHEST WITH CONTRAST TECHNIQUE: Multidetector CT imaging of the chest was performed using the standard protocol during bolus administration of intravenous contrast. Multiplanar CT image reconstructions and MIPs were obtained to evaluate the vascular anatomy. RADIATION DOSE REDUCTION: This exam was performed according to the departmental dose-optimization program which includes automated exposure control, adjustment of the mA and/or kV according to patient size and/or use of iterative reconstruction technique. CONTRAST:  61m OMNIPAQUE IOHEXOL 350 MG/ML SOLN COMPARISON:  12/30/2021 FINDINGS: Cardiovascular: Satisfactory opacification of pulmonary arteries noted, although evaluation is somewhat limited by respiratory motion artifact. No pulmonary emboli identified. Aorta is not well opacified by contrast however no evidence of thoracic aortic aneurysm. Mediastinum/Nodes: No masses or pathologically enlarged lymph nodes identified. Lungs/Pleura: Infiltrate or atelectasis is seen posterior lower lobes bilaterally. Multifocal ill-defined areas of airspace opacity are also seen in the upper lung zones  bilaterally no evidence of pleural effusion. Upper abdomen: No acute findings. Musculoskeletal: No suspicious bone lesions identified. Review  of the MIP images confirms the above findings. IMPRESSION: No evidence of pulmonary embolism. Infiltrate or atelectasis in the posterior lower lobes, and multifocal ill-defined airspace opacities in upper lung zones bilaterally. Differential diagnosis includes atypical infection, inflammatory etiologies, and pulmonary edema. Electronically Signed   By: Marlaine Hind M.D.   On: 04/23/2022 18:09   DG Chest Portable 1 View  Result Date: 05/14/2022 CLINICAL DATA:  Status post fall.  Left hip fracture. EXAM: PORTABLE CHEST 1 VIEW COMPARISON:  [place] 03/01/2022 and 02/26/2022.  CT 12/30/2021. FINDINGS: 0837 hours. Right IJ Port-A-Cath extends to the mid SVC level, stable. The heart size and mediastinal contours are stable. The interstitial opacities seen previously have largely cleared. There may be mild residual underlying fibrosis. No confluent airspace opacity, edema, pleural effusion or pneumothorax. No acute fractures are identified in the chest. There is a left shoulder reverse arthroplasty. Telemetry leads overlie the chest. IMPRESSION: No evidence of acute chest injury or definite active cardiopulmonary process. Interstitial prominence noted previously has improved. Electronically Signed   By: Richardean Sale M.D.   On: 04/30/2022 09:29   DG Femur Min 2 Views Left  Result Date: 04/17/2022 CLINICAL DATA:  Fall this morning. Shortening and rotation of the left leg. EXAM: PELVIS - 1-2 VIEW; LEFT FEMUR 2 VIEWS COMPARISON:  Pelvic radiographs 12/12/2007. FINDINGS: Acute intertrochanteric left femur fracture with associated comminution and varus angulation. No dislocation with significant left hip arthropathy. There are tricompartmental degenerative changes at the left knee. Mild degenerative changes are present at the right hip, and there are degenerative and postsurgical changes in the lower lumbar spine. The sacroiliac joints and symphysis pubis appear intact. Soft tissue swelling around the proximal left hip  fracture. IMPRESSION: 1. Comminuted and angulated intertrochanteric left femur fracture as described. 2. No evidence of acute pelvic fracture. Degenerative and postsurgical changes as described. Electronically Signed   By: Richardean Sale M.D.   On: 05/15/2022 09:27   DG Pelvis 1-2 Views  Result Date: 05/04/2022 CLINICAL DATA:  Fall this morning. Shortening and rotation of the left leg. EXAM: PELVIS - 1-2 VIEW; LEFT FEMUR 2 VIEWS COMPARISON:  Pelvic radiographs 12/12/2007. FINDINGS: Acute intertrochanteric left femur fracture with associated comminution and varus angulation. No dislocation with significant left hip arthropathy. There are tricompartmental degenerative changes at the left knee. Mild degenerative changes are present at the right hip, and there are degenerative and postsurgical changes in the lower lumbar spine. The sacroiliac joints and symphysis pubis appear intact. Soft tissue swelling around the proximal left hip fracture. IMPRESSION: 1. Comminuted and angulated intertrochanteric left femur fracture as described. 2. No evidence of acute pelvic fracture. Degenerative and postsurgical changes as described. Electronically Signed   By: Richardean Sale M.D.   On: 05/06/2022 09:27   CT HEAD WO CONTRAST (5MM)  Result Date: 05/12/2022 CLINICAL DATA:  Neck and head trauma moderate to severe, fall. History atrial fibrillation, stage III chronic kidney disease, hypertension, past history lymphoma EXAM: CT HEAD WITHOUT CONTRAST CT CERVICAL SPINE WITHOUT CONTRAST TECHNIQUE: Multidetector CT imaging of the head and cervical spine was performed following the standard protocol without intravenous contrast. Multiplanar CT image reconstructions of the cervical spine were also generated. RADIATION DOSE REDUCTION: This exam was performed according to the departmental dose-optimization program which includes automated exposure control, adjustment of the mA and/or kV according to patient size and/or use of  iterative reconstruction  technique. COMPARISON:  02/02/2022 FINDINGS: CT HEAD FINDINGS Brain: Mild motion artifacts degrade exam. Generalized atrophy. Normal ventricular morphology. No midline shift or mass effect. Small vessel chronic ischemic changes of deep cerebral white matter. No intracranial hemorrhage, mass lesion, evidence of acute infarction, or extra-axial fluid collection. Vascular: No hyperdense vessels. Atherosclerotic calcification of internal carotid arteries at skull base Skull: Skull base poorly visualized due to motion artifacts, but adequately visualized on cervical spine CT. Calvaria intact. Sinuses/Orbits: Mucosal thickening LEFT maxillary sinus. Small amount of fluid and mucus within sphenoid sinus. Other: N/A CT CERVICAL SPINE FINDINGS Alignment: Anterolisthesis C3-C4. Retrolisthesis C5-C6. Remaining alignments normal. Skull base and vertebrae: Osseous demineralization. Skull base intact. Vertebral body heights maintained. Multilevel disc space narrowing and endplate spur formation C3-C4 through C6-C7. Scattered facet degenerative changes. No fracture, additional subluxation, or bone destruction. Incomplete posterior arch of C1 noted, developmental anomaly. Soft tissues and spinal canal: Prevertebral soft tissues normal thickness. Disc levels:  No specific abnormalities Upper chest: Lung apices clear Other: RIGHT jugular line. Atherosclerotic calcification aortic arch. IMPRESSION: Atrophy with small vessel chronic ischemic changes of deep cerebral white matter. No acute intracranial abnormalities. Multilevel degenerative disc and facet disease changes of the cervical spine. No acute cervical spine abnormalities. Aortic Atherosclerosis (ICD10-I70.0). Electronically Signed   By: Lavonia Dana M.D.   On: 04/23/2022 09:08   CT Cervical Spine Wo Contrast  Result Date: 05/03/2022 CLINICAL DATA:  Neck and head trauma moderate to severe, fall. History atrial fibrillation, stage III chronic kidney  disease, hypertension, past history lymphoma EXAM: CT HEAD WITHOUT CONTRAST CT CERVICAL SPINE WITHOUT CONTRAST TECHNIQUE: Multidetector CT imaging of the head and cervical spine was performed following the standard protocol without intravenous contrast. Multiplanar CT image reconstructions of the cervical spine were also generated. RADIATION DOSE REDUCTION: This exam was performed according to the departmental dose-optimization program which includes automated exposure control, adjustment of the mA and/or kV according to patient size and/or use of iterative reconstruction technique. COMPARISON:  02/02/2022 FINDINGS: CT HEAD FINDINGS Brain: Mild motion artifacts degrade exam. Generalized atrophy. Normal ventricular morphology. No midline shift or mass effect. Small vessel chronic ischemic changes of deep cerebral white matter. No intracranial hemorrhage, mass lesion, evidence of acute infarction, or extra-axial fluid collection. Vascular: No hyperdense vessels. Atherosclerotic calcification of internal carotid arteries at skull base Skull: Skull base poorly visualized due to motion artifacts, but adequately visualized on cervical spine CT. Calvaria intact. Sinuses/Orbits: Mucosal thickening LEFT maxillary sinus. Small amount of fluid and mucus within sphenoid sinus. Other: N/A CT CERVICAL SPINE FINDINGS Alignment: Anterolisthesis C3-C4. Retrolisthesis C5-C6. Remaining alignments normal. Skull base and vertebrae: Osseous demineralization. Skull base intact. Vertebral body heights maintained. Multilevel disc space narrowing and endplate spur formation C3-C4 through C6-C7. Scattered facet degenerative changes. No fracture, additional subluxation, or bone destruction. Incomplete posterior arch of C1 noted, developmental anomaly. Soft tissues and spinal canal: Prevertebral soft tissues normal thickness. Disc levels:  No specific abnormalities Upper chest: Lung apices clear Other: RIGHT jugular line. Atherosclerotic  calcification aortic arch. IMPRESSION: Atrophy with small vessel chronic ischemic changes of deep cerebral white matter. No acute intracranial abnormalities. Multilevel degenerative disc and facet disease changes of the cervical spine. No acute cervical spine abnormalities. Aortic Atherosclerosis (ICD10-I70.0). Electronically Signed   By: Lavonia Dana M.D.   On: 05/05/2022 09:08    Microbiology: No results found for this or any previous visit (from the past 240 hour(s)).   Signed: Edwin Dada, MD 04/30/22

## 2022-05-18 NOTE — Progress Notes (Signed)
  Progress Note   Patient: Carrie [name] YBO:[id number] DOB: October 19, 1940 DOA: 04/23/2022     11 DOS: the patient was seen and examined on 2022/05/17       Brief hospital course: Mrs. Delatte is a 81 y.o. 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      Assessment and Plan: *Acute metabolic encephalopathy Renal failure Liver failure She is becoming less alert, her blood pressure is dropping, her respirations are slowing today. - Start morphine drip - Ativan for anxiety or agitation - Robinul as needed - Turn q2 - Oral care - Comfort measures, expect in hospital death          Subjective: She remains unresponsive to me, family note that her responsiveness is diminished over the last 24 hours, nursing note her breathing is slower, her blood pressure is low, she becomes tachycardic intermittently, although shows no significant agitation     Physical Exam: Vitals:   05/17/2022 0500 05/17/2022 0600 05-17-22 0700 05-17-22 0813  BP:  (!) 57/21  (!) 69/43  Pulse: (!) 121 (!) 120 (!) 124 (!) 114  Resp: '10 14 12 12  '$ Temp:      TempSrc:      SpO2: 96% 96% 96% 96%  Weight:      Height:       Elderly adult female, anasarca, swollen, unresponsive, does not open eyes Tachycardic, heart sounds distant Respirations irregular, coarse Abdomen without grimace or rigidity to palpation Does not respond to questions, does not respond to touch, eyes closed, does not follow commands  Data Reviewed: No new labs Start morphine drip  Family Communication: Daughter and son and spouses at the bedside    Disposition: Status is: Inpatient Expect imminent deterioration and likely in-hospital death        Author: Edwin Dada, MD May 17, 2022 10:29  AM  For on call review [email or web address].

## 2022-05-18 DEATH — deceased
